# Patient Record
Sex: Male | Born: 1935 | Race: White | Hispanic: No | Smoking: Former smoker
Health system: Southern US, Community
[De-identification: ages and names within clinical notes are randomized; demographics above are authoritative.]

## PROBLEM LIST (undated history)

## (undated) DIAGNOSIS — I4819 Other persistent atrial fibrillation: Secondary | ICD-10-CM

## (undated) DIAGNOSIS — D649 Anemia, unspecified: Secondary | ICD-10-CM

## (undated) DIAGNOSIS — I472 Ventricular tachycardia, unspecified: Secondary | ICD-10-CM

## (undated) DIAGNOSIS — K219 Gastro-esophageal reflux disease without esophagitis: Secondary | ICD-10-CM

## (undated) DIAGNOSIS — N183 Chronic kidney disease, stage 3 unspecified: Secondary | ICD-10-CM

## (undated) DIAGNOSIS — I2510 Atherosclerotic heart disease of native coronary artery without angina pectoris: Secondary | ICD-10-CM

## (undated) DIAGNOSIS — I6529 Occlusion and stenosis of unspecified carotid artery: Secondary | ICD-10-CM

## (undated) DIAGNOSIS — I7300 Raynaud's syndrome without gangrene: Secondary | ICD-10-CM

## (undated) DIAGNOSIS — I4729 Other ventricular tachycardia: Secondary | ICD-10-CM

## (undated) DIAGNOSIS — E119 Type 2 diabetes mellitus without complications: Secondary | ICD-10-CM

## (undated) DIAGNOSIS — Z9981 Dependence on supplemental oxygen: Secondary | ICD-10-CM

## (undated) DIAGNOSIS — I739 Peripheral vascular disease, unspecified: Secondary | ICD-10-CM

## (undated) DIAGNOSIS — I639 Cerebral infarction, unspecified: Secondary | ICD-10-CM

## (undated) DIAGNOSIS — J449 Chronic obstructive pulmonary disease, unspecified: Secondary | ICD-10-CM

## (undated) DIAGNOSIS — M1990 Unspecified osteoarthritis, unspecified site: Secondary | ICD-10-CM

## (undated) DIAGNOSIS — G459 Transient cerebral ischemic attack, unspecified: Secondary | ICD-10-CM

## (undated) DIAGNOSIS — I5042 Chronic combined systolic (congestive) and diastolic (congestive) heart failure: Secondary | ICD-10-CM

## (undated) DIAGNOSIS — I10 Essential (primary) hypertension: Secondary | ICD-10-CM

## (undated) DIAGNOSIS — I951 Orthostatic hypotension: Secondary | ICD-10-CM

## (undated) DIAGNOSIS — R5381 Other malaise: Secondary | ICD-10-CM

## (undated) DIAGNOSIS — E785 Hyperlipidemia, unspecified: Secondary | ICD-10-CM

## (undated) DIAGNOSIS — D696 Thrombocytopenia, unspecified: Secondary | ICD-10-CM

## (undated) DIAGNOSIS — I255 Ischemic cardiomyopathy: Secondary | ICD-10-CM

## (undated) HISTORY — DX: Other ventricular tachycardia: I47.29

## (undated) HISTORY — DX: Occlusion and stenosis of unspecified carotid artery: I65.29

## (undated) HISTORY — DX: Ischemic cardiomyopathy: I25.5

## (undated) HISTORY — DX: Ventricular tachycardia: I47.2

## (undated) HISTORY — DX: Peripheral vascular disease, unspecified: I73.9

## (undated) HISTORY — PX: FINGER FRACTURE SURGERY: SHX638

## (undated) HISTORY — PX: ANAL FISTULECTOMY: SHX1139

## (undated) HISTORY — DX: Raynaud's syndrome without gangrene: I73.00

## (undated) HISTORY — PX: CATARACT EXTRACTION, BILATERAL: SHX1313

## (undated) HISTORY — DX: Ventricular tachycardia, unspecified: I47.20

---

## 1989-01-19 HISTORY — PX: CORONARY ARTERY BYPASS GRAFT: SHX141

## 2001-04-10 ENCOUNTER — Ambulatory Visit (HOSPITAL_COMMUNITY): Admission: RE | Admit: 2001-04-10 | Discharge: 2001-04-10 | Payer: Self-pay | Admitting: General Surgery

## 2001-04-10 ENCOUNTER — Encounter: Payer: Self-pay | Admitting: General Surgery

## 2001-04-30 ENCOUNTER — Encounter: Admission: RE | Admit: 2001-04-30 | Discharge: 2001-04-30 | Payer: Self-pay | Admitting: General Surgery

## 2001-04-30 ENCOUNTER — Encounter: Payer: Self-pay | Admitting: General Surgery

## 2003-01-29 ENCOUNTER — Encounter: Payer: Self-pay | Admitting: Internal Medicine

## 2003-01-29 ENCOUNTER — Ambulatory Visit (HOSPITAL_COMMUNITY): Admission: RE | Admit: 2003-01-29 | Discharge: 2003-01-29 | Payer: Self-pay | Admitting: Internal Medicine

## 2004-08-13 ENCOUNTER — Inpatient Hospital Stay (HOSPITAL_COMMUNITY): Admission: RE | Admit: 2004-08-13 | Discharge: 2004-08-14 | Payer: Self-pay | Admitting: Internal Medicine

## 2005-01-09 ENCOUNTER — Emergency Department: Payer: Self-pay | Admitting: Emergency Medicine

## 2005-02-07 ENCOUNTER — Ambulatory Visit: Payer: Self-pay | Admitting: Internal Medicine

## 2005-08-02 ENCOUNTER — Ambulatory Visit: Payer: Self-pay | Admitting: Internal Medicine

## 2005-10-11 ENCOUNTER — Ambulatory Visit (HOSPITAL_COMMUNITY): Admission: RE | Admit: 2005-10-11 | Discharge: 2005-10-11 | Payer: Self-pay | Admitting: Orthopedic Surgery

## 2006-01-23 ENCOUNTER — Encounter: Admission: RE | Admit: 2006-01-23 | Discharge: 2006-04-23 | Payer: Self-pay | Admitting: Orthopedic Surgery

## 2006-02-17 ENCOUNTER — Encounter: Admission: RE | Admit: 2006-02-17 | Discharge: 2006-02-17 | Payer: Self-pay | Admitting: Orthopedic Surgery

## 2006-03-06 ENCOUNTER — Ambulatory Visit: Payer: Self-pay

## 2006-12-01 ENCOUNTER — Ambulatory Visit: Payer: Self-pay

## 2006-12-01 ENCOUNTER — Ambulatory Visit: Payer: Self-pay | Admitting: Internal Medicine

## 2007-02-26 ENCOUNTER — Ambulatory Visit: Payer: Self-pay | Admitting: Internal Medicine

## 2007-06-25 ENCOUNTER — Encounter: Admission: RE | Admit: 2007-06-25 | Discharge: 2007-06-25 | Payer: Self-pay | Admitting: Orthopedic Surgery

## 2007-08-13 ENCOUNTER — Encounter: Admission: RE | Admit: 2007-08-13 | Discharge: 2007-08-13 | Payer: Self-pay | Admitting: Orthopedic Surgery

## 2008-05-14 ENCOUNTER — Encounter: Admission: RE | Admit: 2008-05-14 | Discharge: 2008-05-14 | Payer: Self-pay | Admitting: Orthopedic Surgery

## 2008-05-15 ENCOUNTER — Encounter: Admission: RE | Admit: 2008-05-15 | Discharge: 2008-05-15 | Payer: Self-pay | Admitting: Orthopedic Surgery

## 2008-05-21 ENCOUNTER — Ambulatory Visit: Payer: Self-pay | Admitting: Internal Medicine

## 2008-06-02 ENCOUNTER — Other Ambulatory Visit: Payer: Self-pay

## 2008-06-02 ENCOUNTER — Ambulatory Visit: Payer: Self-pay | Admitting: Ophthalmology

## 2008-06-18 ENCOUNTER — Ambulatory Visit: Payer: Self-pay | Admitting: Ophthalmology

## 2008-11-17 ENCOUNTER — Emergency Department: Payer: Self-pay | Admitting: Emergency Medicine

## 2008-12-09 ENCOUNTER — Encounter: Admission: RE | Admit: 2008-12-09 | Discharge: 2008-12-09 | Payer: Self-pay | Admitting: Orthopedic Surgery

## 2008-12-22 ENCOUNTER — Encounter: Payer: Self-pay | Admitting: Internal Medicine

## 2008-12-30 ENCOUNTER — Encounter: Admission: RE | Admit: 2008-12-30 | Discharge: 2008-12-30 | Payer: Self-pay | Admitting: Anesthesiology

## 2009-01-20 ENCOUNTER — Encounter: Admission: RE | Admit: 2009-01-20 | Discharge: 2009-01-20 | Payer: Self-pay | Admitting: Orthopedic Surgery

## 2009-06-26 ENCOUNTER — Encounter (INDEPENDENT_AMBULATORY_CARE_PROVIDER_SITE_OTHER): Payer: Self-pay | Admitting: *Deleted

## 2009-07-25 DIAGNOSIS — M549 Dorsalgia, unspecified: Secondary | ICD-10-CM | POA: Insufficient documentation

## 2009-07-25 DIAGNOSIS — I472 Ventricular tachycardia, unspecified: Secondary | ICD-10-CM | POA: Insufficient documentation

## 2009-07-25 DIAGNOSIS — E1129 Type 2 diabetes mellitus with other diabetic kidney complication: Secondary | ICD-10-CM | POA: Insufficient documentation

## 2009-07-25 DIAGNOSIS — I509 Heart failure, unspecified: Secondary | ICD-10-CM | POA: Insufficient documentation

## 2009-07-25 DIAGNOSIS — Z95810 Presence of automatic (implantable) cardiac defibrillator: Secondary | ICD-10-CM

## 2009-07-25 DIAGNOSIS — M129 Arthropathy, unspecified: Secondary | ICD-10-CM

## 2009-07-25 DIAGNOSIS — I739 Peripheral vascular disease, unspecified: Secondary | ICD-10-CM

## 2009-07-25 DIAGNOSIS — I255 Ischemic cardiomyopathy: Secondary | ICD-10-CM | POA: Insufficient documentation

## 2009-07-25 DIAGNOSIS — M1990 Unspecified osteoarthritis, unspecified site: Secondary | ICD-10-CM

## 2009-08-04 ENCOUNTER — Ambulatory Visit: Payer: Self-pay | Admitting: Internal Medicine

## 2009-08-05 ENCOUNTER — Encounter: Payer: Self-pay | Admitting: Internal Medicine

## 2010-01-14 ENCOUNTER — Encounter (INDEPENDENT_AMBULATORY_CARE_PROVIDER_SITE_OTHER): Payer: Self-pay | Admitting: *Deleted

## 2010-06-29 ENCOUNTER — Encounter: Payer: Self-pay | Admitting: Internal Medicine

## 2010-08-20 ENCOUNTER — Encounter (INDEPENDENT_AMBULATORY_CARE_PROVIDER_SITE_OTHER): Payer: Self-pay | Admitting: *Deleted

## 2010-10-22 ENCOUNTER — Encounter: Admission: RE | Admit: 2010-10-22 | Discharge: 2010-10-22 | Payer: Self-pay | Admitting: Orthopedic Surgery

## 2010-10-28 ENCOUNTER — Ambulatory Visit: Payer: Self-pay | Admitting: Internal Medicine

## 2010-12-23 NOTE — Letter (Signed)
Summary: Generic Letter  [facility], Main Office  [address]   [place], [place] [address]   Phone: [phone]  Fax: [phone]        June 29, 2010 MRN: [id number]    Victor [name] [address] [place], [place]  [address]    Dear Victor [name],   Our records show that you missed an appointment for a device check in January 2011.  It is past due for a device check and it is also time for an office visit with Dr Ladona Ridgel.  Please call our office to schedule an office visit with Dr Ladona Ridgel.    Sincerely,  Vella Kohler  This letter has been electronically signed by your physician.

## 2010-12-23 NOTE — Letter (Signed)
Summary: Device-Delinquent Check  [facility], Main Office  [address]   [place], [place] [address]   Phone: [phone]  Fax: [phone]     August 20, 2010 MRN: [id number]   Victor [name] [address] [place], [place]  [address]   Dear [name],  According to our records, you have not had your implanted device checked in the recommended period of time.  We are unable to determine appropriate device function without checking your device on a regular basis.  Please call our office to schedule an appointment with Dr Ladona Ridgel,  as soon as possible.  If you are having your device checked by another physician, please call us so that we may update our records.  Thank you,   [facility]

## 2010-12-23 NOTE — Letter (Signed)
Summary: Device-Delinquent Check  [facility], Main Office  [address]   [place], [place] [address]   Phone: [phone]  Fax: [phone]     August 20, 2010 MRN: [id number]   Victor [name] [address] [place], [place]  [address]   Dear Mr. Heinle,  According to our records, you have not had your implanted device checked in the recommended period of time.  We are unable to determine appropriate device function without checking your device on a regular basis.  Please call our office to schedule an appointment with Dr Taylor,  as soon as possible.  If you are having your device checked by another physician, please call us so that we may update our records.  Thank you,   [facility]  

## 2010-12-23 NOTE — Assessment & Plan Note (Signed)
Summary: pacer ck/mt   Visit Type:  Follow-up Primary Provider:  [facility]    History of Present Illness: Victor [name] returns today for ICD followup.  He is a 75 yo man with a h/o ICM and VT s/p ICD implant.  He denies c/p or sob.   He denies any intercurrent ICD therapies.  Mild peripheral edema.  He notes that in the interim he was kicked by his horse resulting in an ER visit.  No other complaints.  Current Medications (verified): 1)  Lyrica 50 Mg Caps (Pregabalin) .Marland Kitchen.. 1 By Mouth Once Daily 2)  Glucophage 850 Mg Tabs (Metformin Hcl) .Marland Kitchen.. 1 By Mouth Two Times A Day 3)  Aspirin Ec 325 Mg Tbec (Aspirin) .... Take One Tablet By Mouth Daily 4)  Humulin 70/30 70-30 % Susp (Insulin Isophane & Regular) .... As Directed 5)  Furosemide 40 Mg Tabs (Furosemide) .... Take One Tablet By Mouth Daily As Needed 6)  Lovastatin 10 Mg Tabs (Lovastatin) .... Take One Tablet By Mouth Daily At Bedtime  Allergies (verified): No Known Drug Allergies  Past History:  Past Medical History: Last updated: 07/25/2009 DEGENERATIVE JOINT DISEASE (ICD-715.90) DM (ICD-250.00) ARTHRITIS (ICD-716.90) CAROTID ARTERY DISEASE (ICD-433.10) PERIPHERAL VASCULAR DISEASE (ICD-443.9) CHF (ICD-428.0) BACK PAIN (ICD-724.5) AUTOMATIC IMPLANTABLE CARDIAC DEFIBRILLATOR SITU (ICD-V45.02) VENTRICULAR TACHYCARDIA (ICD-427.1) CARDIOMYOPATHY, ISCHEMIC (ICD-414.8)  Past Surgical History: Last updated: 07/25/2009   Anal fistulotomy.  Explantation of a previously implanted Guidant ICD and insertion of a new Guidant ICD  08/13/2004 Doylene Canning. Ladona Ridgel, M.D.   Review of Systems  The patient denies chest pain, syncope, dyspnea on exertion, and peripheral edema.    Vital Signs:  Patient profile:   75 year old male Height:      68 inches Weight:      195 pounds BMI:     29.76 Pulse rate:   76 / minute BP sitting:   122 / 70  (left arm)  Vitals Entered By: Laurance Flatten CMA (October 28, 2010 11:42 AM)  Physical  Exam  General:  Well developed, well nourished, in no acute distress. Head:  normocephalic and atraumatic Eyes:  PERRLA/EOM intact; conjunctiva and lids normal. Mouth:  Teeth, gums and palate normal. Oral mucosa normal. Neck:  Neck supple, no JVD. No masses, thyromegaly or abnormal cervical nodes. Chest Wall:  Well healed ICD incision. Lungs:  Clear bilaterally to auscultation with no wheezes, rales, or rhonchi. Heart:  RRR with normal S1 and S2.  PMI is enlarged and laterally displaced. Abdomen:  Bowel sounds positive; abdomen soft and non-tender without masses, organomegaly, or hernias noted. No hepatosplenomegaly. Msk:  Back normal, normal gait. Muscle strength and tone normal. Pulses:  pulses normal in all 4 extremities Extremities:  No clubbing or cyanosis. Neurologic:  Alert and oriented x 3.    ICD Specifications Following MD:  Lewayne Bunting, MD     ICD Vendor:  [facility] [employer]     ICD Model Number:  T135     ICD Serial Number:  [id number] ICD DOI:  08/13/2004     ICD Implanting MD:  Lewayne Bunting, MD  Lead 1:    Location: RV     DOI: 08/13/2004     Model #: [id number]     Serial #: [id number]     Status: active  Indications::  VT,ICM   ICD Follow Up Remote Check?  No Battery Voltage:  2.58 V     Charge Time:  10.8 seconds     Battery Est. Longevity:  MOL2 Underlying rhythm:  SR ICD Dependent:  No       ICD Device Measurements Right Ventricle:  Amplitude: 14.5 mV, Impedance: 530 ohms, Threshold: 0.6 V at 0.4 msec Shock Impedance: 46 ohms   Episodes Shock:  0     ATP:  1     Nonsustained:  4     ICD Appropriate Therapy?  Yes Ventricular Pacing:  0%  Brady Parameters Mode VVI     Lower Rate Limit:  40      Tachy Zones VF:  220     VT:  195     VT1:  165     Next Cardiology Appt Due:  01/20/2011 Tech Comments:  No parameter changes.   Device function normal.  1 VT episodes treated successfully with ATP.  No Latitude 2 this time.  ROV 3 months clinic. Altha Harm, LPN  October 28, 2010 12:04 PM  MD Comments:  Agree with above.  Impression & Recommendations:  Problem # 1:  AUTOMATIC IMPLANTABLE CARDIAC DEFIBRILLATOR SITU (ICD-V45.02) His device is working normally, Psychologist, prison and probation services.    Problem # 2:  VENTRICULAR TACHYCARDIA (ICD-427.1) His VT has been well controlled.  Continue conservative therapy.  His updated medication list for this problem includes:    Aspirin Ec 325 Mg Tbec (Aspirin) .Marland Kitchen... Take one tablet by mouth daily  Problem # 3:  CHF (ICD-428.0) His symptoms are well controlled. He is not on a beta blocker secondar to fatigue. His updated medication list for this problem includes:    Aspirin Ec 325 Mg Tbec (Aspirin) .Marland Kitchen... Take one tablet by mouth daily    Furosemide 40 Mg Tabs (Furosemide) .Marland Kitchen... Take one tablet by mouth daily as needed  Patient Instructions: 1)  Your physician recommends that you schedule a follow-up appointment in: 3 months in the device clinic 2)  Your physician recommends that you continue on your current medications as directed. Please refer to the Current Medication list given to you today.

## 2010-12-23 NOTE — Cardiovascular Report (Signed)
Summary: Office Visit   Office Visit   Imported By: Roderic Ovens 11/04/2010 12:03:21  _____________________________________________________________________  External Attachment:    Type:   Image     Comment:   External Document

## 2010-12-23 NOTE — Letter (Signed)
Summary: Appointment - Missed  [facility], Main Office  [address]   [place], [place] [address]   Phone: [phone]  Fax: [phone]     January 14, 2010 MRN: [id number]   RAMEL TOBON [address] [place], [place]  [address]   Dear Mr. Cullars,  Our records indicate you missed your appointment on 12/09/09 with [facility] for wound check. It is very important that we reach you to reschedule this appointment. We look forward to participating in your health care needs. Please contact us at the number listed above at your earliest convenience to reschedule this appointment.     Sincerely,   Ruel Favors Scheduling Team

## 2011-01-18 ENCOUNTER — Encounter (INDEPENDENT_AMBULATORY_CARE_PROVIDER_SITE_OTHER): Payer: Self-pay | Admitting: *Deleted

## 2011-01-27 NOTE — Letter (Signed)
Summary: Appointment - Reschedule  [facility], Main Office  [address]   [place], [place] [address]   Phone: [phone]  Fax: [phone]     January 18, 2011 MRN: [id number]   AJDIN MACKE [address] [place], [place]  [address]   Dear Mr. Maines,   Due to a change in our office schedule, your appointment on  01-27-11  at  12:00p               must be changed.  It is very important that we reach you to reschedule this appointment. We look forward to participating in your health care needs. Please contact us at the number listed above at your earliest convenience to reschedule this appointment.     Sincerely,  Glass blower/designer

## 2011-02-07 ENCOUNTER — Encounter: Payer: Self-pay | Admitting: Internal Medicine

## 2011-02-07 ENCOUNTER — Encounter (INDEPENDENT_AMBULATORY_CARE_PROVIDER_SITE_OTHER): Payer: Medicare Other

## 2011-02-07 DIAGNOSIS — I428 Other cardiomyopathies: Secondary | ICD-10-CM

## 2011-02-11 ENCOUNTER — Ambulatory Visit
Admission: RE | Admit: 2011-02-11 | Discharge: 2011-02-11 | Disposition: A | Discharge status: Home / Self Care | Payer: Managed Care, Other (non HMO) | Source: Ambulatory Visit | Attending: Orthopedic Surgery | Admitting: Orthopedic Surgery

## 2011-02-11 ENCOUNTER — Other Ambulatory Visit: Payer: Self-pay | Admitting: Orthopedic Surgery

## 2011-02-11 DIAGNOSIS — M545 Low back pain: Secondary | ICD-10-CM

## 2011-02-17 NOTE — Procedures (Signed)
Summary: 3 [email or web address]  [facility]   Current Medications (verified): 1)  Humulin 70/30 70-30 % Susp (Insulin Isophane & Regular) .... As Directed 2)  Lyrica 75 Mg Caps (Pregabalin) .... 2 By Mouth Two Times A Day 3)  Metformin Hcl 850 Mg Tabs (Metformin Hcl) .... One By Mouth Two Times A Day 4)  Aspirin 81 Mg Tbec (Aspirin) .... One By Mouth Daily 5)  Furosemide 20 Mg Tabs (Furosemide) .... One By Mouth Daily 6)  Pravastatin Sodium 40 Mg Tabs (Pravastatin Sodium) .... One By Mouth Daily 7)  Pantoprazole Sodium 40 Mg Tbec (Pantoprazole Sodium) .... One By Mouth Daily 8)  Oxycodone-Acetaminophen 5-325 Mg Tabs (Oxycodone-Acetaminophen) .... As Needed 9)  Hydrocodone-Acetaminophen 10-650 Mg Tabs (Hydrocodone-Acetaminophen) .... As Needed 10)  Prednisone 10 Mg Tabs (Prednisone) .... One By Mouth Daily 11)  Cilostazol 100 Mg Tabs (Cilostazol) .... One By Mouth Two Times A Day 12)  Skelaxin 800 Mg Tabs (Metaxalone) .... One By Mouth Two Times A Day 13)  Fish Oil 1000 Mg Caps (Omega-3 Fatty Acids) .... One By Mouth Daily  Allergies (verified): No Known Drug Allergies   ICD Specifications Following MD:  Lewayne Bunting, MD     ICD Vendor:  [facility] [employer]     ICD Model Number:  T135     ICD Serial Number:  [id number] ICD DOI:  08/13/2004     ICD Implanting MD:  Lewayne Bunting, MD  Lead 1:    Location: RV     DOI: 08/13/2004     Model #: [id number]     Serial #: [id number]     Status: active  Indications::  VT,ICM   ICD Follow Up ICD Dependent:  No      Brady Parameters Mode VVI     Lower Rate Limit:  40      Tachy Zones VF:  220     VT:  195     VT1:  165     Tech Comments:  See Pace Art

## 2011-04-04 ENCOUNTER — Ambulatory Visit
Admission: RE | Admit: 2011-04-04 | Discharge: 2011-04-04 | Disposition: A | Discharge status: Home / Self Care | Payer: Medicare Other | Source: Ambulatory Visit | Attending: Orthopedic Surgery | Admitting: Orthopedic Surgery

## 2011-04-04 ENCOUNTER — Other Ambulatory Visit: Payer: Self-pay | Admitting: Orthopedic Surgery

## 2011-04-04 DIAGNOSIS — S0083XA Contusion of other part of head, initial encounter: Secondary | ICD-10-CM

## 2011-04-05 NOTE — Assessment & Plan Note (Signed)
[facility]                         ELECTROPHYSIOLOGY OFFICE NOTE   FUTURE, YELDELL                       MRN:          [id number]  DATE:02/26/2007                            DOB:          [birth year]    Victor [name] returns today for followup.  He is a pleasant 75 year old  male with an ischemic cardiomyopathy and ventricular tachycardia,  diabetes, arthritis, and other medical problems.  The patient returns  today followup.  When I saw him last back in January he was not on beta  blocker and was not completely clear that he had ever been tried on one,  although I suspect in retrospect that he had, so we started him on three  twice a day of Coreg and unfortunately he developed severe weakness and  shortness of breath which resolved immediately after d/cranial nerves of  his beta blocker.  At this point I would say he is intolerant to beta  blockers based on the above findings.  The patient's arthritis has  improved some and he continues to work and be quite vigorous, riding his  horses on a regular basis.   EXAMINATION TODAY:  GENERAL:  He is a pleasant 75 year old man in no  acute distress.  VITAL SIGNS:  The blood pressure was 120/68, the pulse 81 and regular,  respirations were 18.  NECK:  Revealed no jugular venous distention.  Today there was a very  soft carotid bruit on the right.  This was present from before.  LUNGS:  Reveal no rales, no wheezes, no rhonchi.  There is no increased  work of breathing.  CARDIOVASCULAR:  Revealed a regular rate and rhythm with normal S1 and  S2.  EXTREMITIES:  Demonstrated no cyanosis, clubbing or edema.   His EKG demonstrated sinus rhythm with prior inferior MI.   Interrogation of his defibrillator demonstrates a Guidant Vitality with  R waves of 10, a pacing impedance of 700 ohms, and a threshold of 0.6  volts at 0.5 milliseconds.  Battery voltage is 3.10 volts.  There are no  intercurrent ICD  therapies.   IMPRESSION:  1. Ischemic cardiomyopathy.  2. Ventricular tachycardia.  3. Congestive heart failure, presently class II.  4. Peripheral/carotid vascular disease.  5. Arthritis.  6. Diabetes.   DISCUSSION:  Overall, Mr. Higginbotham is stable despite his multiple medical  problems.  He will continue on his present medical therapy which  includes aspirin, Glucophage, furosemide, potassium, and lovastatin.  I  will plan to see him back in the office in 1 year, sooner if he should  have any additional problems.    Doylene Canning. Ladona Ridgel, MD  Electronically Signed   GWT/MedQ  DD: 02/26/2007  DT: 02/26/2007  Job #: [id number]

## 2011-04-05 NOTE — Assessment & Plan Note (Signed)
[facility]                         ELECTROPHYSIOLOGY OFFICE NOTE   DERRIC, DEALMEIDA                       MRN:          [id number]  DATE:05/21/2008                            DOB:          [birth year]    [name] returns today for followup.  He is a very pleasant man with a  longstanding history of coronary artery disease and prior myocardial  infarction, bypass grafting, and insertion of a defibrillator initially  back in 1990.  He has had multiple implants in the past and returns  today for followup.  His most current problem has been that of severe  arthritis both in his neck and his lower back.  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  He denies chest pain.  He denies shortness of breath.   CURRENT MEDICATIONS:  Include Lyrica 2 tablets a day, Darvocet 1 tablet  3 times a day, Glucophage 850 twice a day, aspirin 325 a day, Humulin  insulin 70/30, furosemide 40 p.r.n., lovastatin daily, and multiple  muscle relaxers and pain medications.   He had been intolerant in the past to BETA-BLOCKERs.   PHYSICAL EXAMINATION:  GENERAL:  He is a pleasant, middle-aged man in no  acute distress.  VITAL SIGNS:  The blood pressure today was 120/78, the pulse was 73 and  regular, respirations were 18, and the weight was 209 pounds.  NECK:  Revealed no jugular venous distention.  LUNGS:  Clear bilaterally to auscultation.  No wheezes, rales, or  rhonchi are present.  CARDIOVASCULAR:  Regular rate and rhythm with normal S1, S2, with no  murmurs, rubs, or gallops.  ABDOMEN:  Soft, nontender.  EXTREMITIES:  Demonstrated no cyanosis, clubbing, or edema.  The pulses  are 2+ and symmetric.   Interrogation of his defibrillator demonstrates a Guidant Vitality T-135  implanted in September 2005.  Battery voltage was 2.8 volts, the R-waves  were 11, the impedance 666, and threshold 0.6 at 0.5.  The  patient had  one episode of VT, which was terminated with  antitachycardia pacing  therapy and one nonsustained episode terminated spontaneously.   IMPRESSION:  1. Ischemic cardiomyopathy.  2. Ventricular tachycardia.  3. Status post implantable cardioverter-defibrillator insertion.  4. Very severe back pain.   DISCUSSION:  Overall, [name] is stable.  His defibrillator is  working normally.  Hopefully, his back problems will improve.  I will  see him back in the ICD clinic in a year.     Doylene Canning. Ladona Ridgel, MD  Electronically Signed    GWT/MedQ  DD: 05/21/2008  DT: 05/22/2008  Job #: [id number]

## 2011-04-08 NOTE — Discharge Summary (Signed)
Victor [name], Victor [name] NO.:  [id number]   MEDICAL RECORD NO.:  [id number]          PATIENT TYPE:  INP   LOCATION:  3711                         FACILITY:  [facility]   PHYSICIAN:  Doylene Canning. Ladona Ridgel, M.D.  DATE OF BIRTH:  Nov 04, 1936   DATE OF ADMISSION:  08/13/2004  DATE OF DISCHARGE:  08/14/2004                                 DISCHARGE SUMMARY   ADDENDUM:  Please see the original discharge summary for complete details.   On the morning of August 14, 2004, the patient was seen by Dr. Graciela Husbands.  He  was doing well status post ICD implantation.  His chest x-ray was without  pneumothorax.   DISCHARGE MEDICATIONS:  As previously dictated.   FOLLOW UP:  As previously dictated on September 09, 2004,  at 9:30 with the  [facility] and Dr. Ladona Ridgel in three months.  The office will contact him  with an appointment.       SW/MEDQ  D:  08/14/2004  T:  08/14/2004  Job:  [id number]   cc:   ________ Malissa Hippo, M.D.

## 2011-04-08 NOTE — Op Note (Signed)
Victor [name], Victor [name] NO.:  [id number]   MEDICAL RECORD NO.:  [id number]          PATIENT TYPE:  INP   LOCATION:  2889                         FACILITY:  [facility]   PHYSICIAN:  Doylene Canning. Ladona Ridgel, M.D.  DATE OF BIRTH:  11/09/1936   DATE OF PROCEDURE:  08/13/2004  DATE OF DISCHARGE:                                 OPERATIVE REPORT   PROCEDURE PERFORMED:  Removal of a previously implanted abdominal epicardial  implantable cardioverter-defibrillator generator with capping of the old  epicardial leads and patches, followed by insertion of a new transvenous  single-chamber defibrillator by way of the left subclavian vein utilizing  defibrillation threshold testing.   SURGEON:  Doylene Canning. Ladona Ridgel, M.D.   I. INTRODUCTION:  The patient is a 75 year old male with a history of VT and  ischemic cardiomyopathy, who is status post initial ICD 15 years ago.  At  that time, he had an epicardial system placed transthoracically.  He has had  3 ICD generator changes, the last a year and a half ago.  Over the last 6  months, he was noted to have beeping which he thought was coming from his  defibrillator.  Despite this, he was checked several times in our office and  found to have the defibrillator working normally with normal battery life.  The patient, on re-interrogation of the device, was found to have the device  at end of life and was unable to be communicated with.  He is now referred  for ICD generator removal from his previously implanted epicardial system,  abandonment of the epicardial leads and insertion of a new transvenous  system by way of the left subclavian vein.   II. PROCEDURE:  After informed consent was obtained, the patient was taken  to the diagnostic EP lab in a fasting state.  After the usual preparation  and draping, intravenous fentanyl and midazolam were given for sedation.  A  total of 30 mL of lidocaine were infiltrated into the left upper quadrant  region  in the abdomen over the old ICD site.  A 10-cm incision was carried  out over this region and electrocautery utilized to dissect down to the  previously implanted abdominal generator.  It was removed with gentle  traction.  The epicardial leads and patches were subsequently capped and  secured in place back in the pocket.  Kanamycin irrigation was utilized to  irrigate the pocket and the incision was closed with a layer of 2-0 Vicryl,  followed by a layer of 3-0 Vicryl, followed by a layer of 4-0 Vicryl.  Benzoin was painted on the skin, Steri-Strips were applied and the patient  reprepped and draped for a new transvenous ICD insertion by way of the left  subclavian vein.   After the usual preparation and draping, additional fentanyl and midazolam  were given in the left infraclavicular region.  Thirty milliliters of  lidocaine were infiltrated over this region and a 9-cm incision was carried  out in the left infraclavicular region.  Electrocautery was again utilized  to dissect down to the fascial plane.  The  left subclavian vein was  punctured and the Guidant model [id number] defibrillation lead was advanced  by way of the left subclavian vein into the right ventricle.  Mapping was  carried out along the RV outflow tract as well as the RV septum and the RV  apex.  Overall, the R waves were markedly reduced throughout the area.  At  the final site which had the highest R wave initially, the R waves measured  8-9 mV, but once the lead was actively affixed, the R waves measured 6.0 to  6.5 mV.  The pacing impedance with the lead actively affixed was 843 ohms  and stable, and a ventricular threshold of 0.8 volts at 0.5 msec.  Ten-volt  pacing did not stimulate the diaphragm.  With the defibrillation lead in  satisfactory position, it was secured to the sub-pectoralis fascia with a  figure-of-eight silk suture.  In addition, the sew-in sleeve was secured  with silk suture.  Electrocautery  was utilized to make a subcutaneous  pocket.  Kanamycin irrigation was utilized to irrigate the pocket and  electrocautery utilized to assure hemostasis.  The Guidant Vitality DSVR  model T135 single-chamber defibrillator, serial [id number], was connected to  the defibrillation lead and placed in the subcutaneous pocket.  The  generator was secured with silk suture.  Additional Kanamycin was utilized  to irrigate the pocket and defibrillation threshold testing carried out.   After the patient was more deeply sedated with fentanyl and Versed, VF was  induced with a T wave shock.  A 14-joule shock was delivered which  terminated ventricular fibrillation and restored sinus rhythm.  Five minutes  were allowed to lapse and the second VF test carried out.  Again, VF was  induced with a T wave shock and a second 14-joule shock was delivered which  terminated ventricular fibrillation, restoring sinus rhythm.  At this point,  no additional defibrillation threshold testing was carried out and the  incision was closed with a layer of 2-0 Vicryl, followed by a layer of 3-0  Vicryl, followed by a layer of 4-0 Vicryl.  Benzoin was painted on the skin,  Steri-Strips were applied and a pressure dressing, and the patient returned  to his room in satisfactory condition.   III. COMPLICATIONS:  There were no immediate procedure complications.   IV. RESULTS:  This demonstrates successful implantation of a Guidant single-  chamber defibrillator following the removal of a previously implanted  Guidant epicardial system with capping of the epicardial leads and  epicardial patches, and abandonment of this system, followed by the  insertion of a new transvenous system.  Please note that the 75 year old  epicardial system was abandoned secondary to the unreliability of the  patient's patches and leads.       GWT/MEDQ  D:  08/13/2004  T:  08/15/2004  Job:  [id number]   cc:   Marcie Bal.D.

## 2011-04-08 NOTE — Op Note (Signed)
[place]. [facility]  Patient:    Victor [name], Victor [name]                         MRN: [id number] Proc. Date: 04/10/01 Attending:  Sharlet Salina T. Hoxworth, M.D.                           Operative Report  PREOPERATIVE DIAGNOSIS:  Fistula in ano.  POSTOPERATIVE DIAGNOSIS:  Fistula in ano.  PROCEDURE:  Anal fistulotomy.  SURGEON:  Lorne Skeens. Hoxworth, M.D.  ANESTHESIA:  General.  BRIEF HISTORY:  Mr. 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  Anal fistulotomy has been recommended and accepted.  The nature of the procedure, its indications, and risks were discussed in the office, and he now presents for this procedure.  DESCRIPTION OF PROCEDURE:  The patient was brought to the operating room, placed in the supine position on the operating table, and general endotracheal anesthesia was induced.  He was carefully positioned and padded in the lithotomy position, and the perineum was prepped and draped.  He received antibiotics preoperatively.  The external opening was about 3 cm from the anal verge in the anterior position, with an easily palpable internal opening radially oriented.  A probe was able to be passed very easily through the external opening, through the internal opening, into the anus.  The skin and anoderm overlying the probe was then incised and dissection deepened down toward the probe.  The probe was found to have traversed a significant portion of the external anal sphincter, and this was partially divided and then a rubber band seton was placed through the remainder of the fistula tract rather than dividing it all at this setting due to concern of incontinence. Hemostasis was obtained with cautery.  Soft tissue was infiltrated with Marcaine.  Sponge, needle, and instrument counts were correct.  The patient was taken to recovery in  good condition. DD:  04/10/01 TD:  04/10/01 Job: [id number]

## 2011-04-08 NOTE — Op Note (Signed)
Victor [name], CRAGO NO.:  [id number]   MEDICAL RECORD NO.:  [id number]                   PATIENT TYPE:  OIB   LOCATION:  2864                                 FACILITY:  [facility]   PHYSICIAN:  Doylene Canning. Ladona Ridgel, M.D. [facility]           DATE OF BIRTH:  [birth year]   DATE OF PROCEDURE:  01/29/2003  DATE OF DISCHARGE:                                 OPERATIVE REPORT   PROCEDURE:  Explantation of a previously implanted Guidant ICD and insertion  of a new Guidant ICD with defibrillation threshold testing and  defibrillation lead testing.   INTRODUCTION:  The patient is a very pleasant, middle-aged man with a  history of an ischemic cardiomyopathy and sustained VT with syncope, with  easily inducible VT at EP testing.  The patient underwent initial ICD  insertion in 1990 with epicardial patches placed at that time.  His initial  system utilized two epicardial-0040 patches, serial numbers [id number] and  serial numbers [id number].  His epicardial pacing leads were epicardial screws  model 4312, serial number [id number] and [id number].  The patient was recently found  to be at device ERI and is now referred for ICD generator change.   DESCRIPTION OF PROCEDURE:  After informed consent was obtained, the patient  was taken to the diagnostic EP lab in the fasting state. After usual  preparation and draping, a total of 30 mL of lidocaine was infiltrated into  the left abdominal region over the previous ICD insertion scar.  A 10 cm  incision was carried out over the old ICD scar and electrocautery utilized  to dissect down to the old device. It was removed with gentle traction using  Kelly forceps.  The defibrillation and pacing leads were subsequently  removed and tested. R waves were found to be 19 millivolts and the pacing  threshold was 1.4 volts at 0.5 milliseconds with a pacing impedence of 587  ohms.  The patient was deeply sedated. A 1 joule shock delivered through the  device  demonstrated a cycle impedence of 38 ohms.  At this point,  defibrillation threshold testing was carried out.   The patient had VF induced with a T wave shock. A 14 joule shock terminated  ventricular fibrillation restoring sinus rhythm. The shock impedence was 37  ohms. The duration of VF was seven seconds.  With satisfactory  defibrillation threshold testing, the incision was irrigated with Kanamycin  irrigation.  Electrocautery was utilized to assure hemostasis and the device  was turned back on to monitor plus therapy and the incision was closed with  a layer of 2-0 Vicryl, followed by a layer of 3-0 Vicryl, followed by a  layer of 4-0 Vicryl. Benzoin was painted on the skin. Steri-Strips were  applied and a pressure dressing placed. The patient returned to his room in  satisfactory condition.   COMPLICATIONS:  There were no immediate procedure complications.  RESULTS:  This demonstrated successful removal of a previous Guidant ICD at  [facility] and insertion of a new Guidant ICD (mini 4+ AICD model 1796 serial  number [id number]). There were no immediate procedure complications.                                               Doylene Canning. Ladona Ridgel, M.D. [facility]   GWT/MEDQ  D:  01/29/2003  T:  01/30/2003  Job:  [id number]   cc:   Duke Salvia, M.D. [facility]   Dr. Nathanial Rancher, [place]

## 2011-04-08 NOTE — Discharge Summary (Signed)
Victor [name], Victor [name] NO.:  [id number]   MEDICAL RECORD NO.:  [id number]          PATIENT TYPE:  INP   LOCATION:  3711                         FACILITY:  [facility]   PHYSICIAN:  Victor [name], M.D.  DATE OF BIRTH:  [birth year]   DATE OF ADMISSION:  08/13/2004  DATE OF DISCHARGE:  08/14/2004                                 DISCHARGE SUMMARY   DISCHARGE DIAGNOSES:  1.  Status post removal of abdominal implantable cardioverter defibrillator      device, with insertion in the left subclavian area of the Guidant      VITALITY DS VR, model T135, serial number [id number].  2.  End of life for previous generator which was placed in 1997.  3.  History of nonsustained ventricular tachycardia associated with syncope      which required resuscitation.  4.  Inducible ventricular tachycardia at electrophysiology study.  5.  Status post implantable cardioverter defibrillator placed in 1990,      second in 1994, third in 1997.  6.  Type 2 diabetes.  7.  Obesity.  8.  Ejection fraction 30-35%.  9.  Prior inferior myocardial infarction.   PROCEDURE:  Removal of implantable cardioverter defibrillator generator and  capping of epicardial abdominal leads with patch, insertion of new  transvenous implantable cardioverter defibrillator system by way of the left  subclavian vein, Dr. Lewayne Bunting.  Defibrillation testing rated less than  or equal to 14 joules.   DISCHARGE DISPOSITION:  The patient tolerated the procedure well.  He has  had mobility issues in the left upper extremity explained to him before  discharge.  He knows that he should keep incisions both in the abdomen and  in the left chest clean and dry for the next week.  He will be able to show  beginning Friday August 20, 2004.  He is asked not to drive for the next  10 days.  He is to call [phone] if he has any problems such as fever,  chills, increased pain at the ICD site, or swelling at the pacer pocket.   He goes  home on the following medications:  1.  Enteric coated aspirin 325 mg daily.  2.  Humulin 70/30, 40 units in the morning, 30 units in the evening.  3.  Glucophage 850 mg b.i.d.  4.  Neurontin 300 mg t.i.d.  5.  Nexium 40 mg daily.  6.  Antibiotic Keflex 500 mg b.i.d. for five days.   DISCHARGE PAIN MEDICATION:  Tylenol 325 mg 1-2 tabs q.4-6 h. as needed for  pain.   He is asked not to drive for the next 10 days.   BRIEF HISTORY:  Victor [name] is a 75 year old male with a history of ischemic  cardiomyopathy, inducible ventricular tachycardia.  He is status post three  separate implantations of ICD from 1990 to the present.  He had a generator  change about 1-1/2 years ago, but now found to have a nonfunctioning device.  The patient noted the device had been beeping for about six months.  On  interrogation, battery seemed fine.  He is now admitted for removal of an  abdominal generator and capping of abdominal leads.  He will have insertion  of a new generator by way of left subclavian transvenous system.  Plan is to  remove the abdominal device, cap the leads which are 73 years old, patch  this area in the abdomen and proceed with a standard implant of ICD by  Guidant.   HOSPITAL COURSE:  The patient has tolerated the procedure well.  Ready for  discharge postprocedure day one.  He goes home with the medications and  followup as dictated.  He will have an appointment at [facility]  Thursday September 09, 2004 at 9:30, and he will follow up with Dr. Lewayne Bunting in three months.  Dr. Lubertha Basque office will call with that  appointment.  Once again, he goes home on Keflex 500 mg b.i.d. for five  days.       GM/MEDQ  D:  08/13/2004  T:  08/15/2004  Job:  [id number]   cc:   Victor [name], M.D.   [place], M.D.  [facility]

## 2011-04-08 NOTE — Assessment & Plan Note (Signed)
[facility]                         ELECTROPHYSIOLOGY OFFICE NOTE   NAYSHAWN, MESTA                       MRN:          [id number]  DATE:12/01/2006                            DOB:          [birth year]    Mr. Bozard returns today for followup.  He is a very pleasant middle-  aged male with ischemic cardiomyopathy and ventricular tachycardia,  status post ICD insertion.  He returns today for followup.  He has had  some back problems and has been evaluated by Dr. Montez Morita, here in  [place], for this.  He denies chest pain or shortness of breath.   His medications include aspirin, Glucophage, Nexium, Humulin, Lyrica,  furosemide, potassium, Skelaxin and Etodolac.   EXAM:  He is a pleasant, well-appearing, middle-aged man, in no acute  distress.  The blood pressure was 153/79, the pulse 73 and regular, the  respirations were 18, the weight was 219 pounds.  NECK:  Revealed no jugular venous distention.  There was a soft carotid  bruit on the right.  The trachea was midline.  There was no thyromegaly.  LUNGS:  Clear bilaterally to auscultation.  There were no wheezes, rales  or rhonchi.  CARDIOVASCULAR EXAM:  Revealed a regular rate and rhythm with normal S1  and S2.  There were no murmurs, rubs or gallops appreciated.  EXTREMITIES:  Demonstrated trace peripheral edema bilaterally.   The EKG demonstrates sinus rhythm with PACs.   Interrogation of his defibrillator demonstrates a Guidant Vitality DS  with no intercurrent ICD therapies.  The R-waves were 11, the impedance  675 ohms, the threshold 0.6 at 0.5 milliseconds.  The battery voltage is  3.14 volts.   IMPRESSION:  1. Ischemic cardiomyopathy.  2. Ventricular tachycardia.  3. Diabetes.  4. Degenerative joint disease with multiple arthritic problems.   DISCUSSION:  [name] is not on a beta blocker.  The old chart has  been reviewed and there is no clear indication as to why he is not.   His  wife states that he may have had some problems with fatigue on beta  blockers in the past, but they are uncertain of this at the present  time.  For this reason, I recommend that we start him on carvedilol  3.125 mg twice daily.  If he tolerates this okay, then we would also  consider adding an ACE inhibitor when I see him back in the office.  Because of his carotid bruit and reported carotid calcification and  other vascular  calcification, I have recommended that we go ahead and obtain a carotid  ultrasound to better  evaluate his carotid bruit.  I will plan to see him back in the office  in three months.  He will forego his latitude check in three months.     Doylene Canning. Ladona Ridgel, MD  Electronically Signed    GWT/MedQ  DD: 12/01/2006  DT: 12/01/2006  Job #: [id number]

## 2011-04-25 ENCOUNTER — Encounter: Payer: Self-pay | Admitting: Internal Medicine

## 2011-05-13 ENCOUNTER — Encounter: Payer: Managed Care, Other (non HMO) | Admitting: Internal Medicine

## 2011-05-18 ENCOUNTER — Encounter: Payer: Self-pay | Admitting: Internal Medicine

## 2011-08-29 ENCOUNTER — Ambulatory Visit (INDEPENDENT_AMBULATORY_CARE_PROVIDER_SITE_OTHER): Payer: Medicare Other | Admitting: *Deleted

## 2011-08-29 ENCOUNTER — Encounter: Payer: Self-pay | Admitting: Internal Medicine

## 2011-08-29 DIAGNOSIS — I472 Ventricular tachycardia: Secondary | ICD-10-CM

## 2011-08-29 DIAGNOSIS — I509 Heart failure, unspecified: Secondary | ICD-10-CM

## 2011-08-29 LAB — ICD DEVICE OBSERVATION
BATTERY VOLTAGE: 2.51 V
BRDY-0002RV: 40 {beats}/min
RV LEAD THRESHOLD: 0.6 V
TZAT-0001SLOWVT: 1
TZAT-0002FASTVT: NEGATIVE
TZAT-0004FASTVT: 8
TZAT-0004SLOWVT: 8
TZAT-0004SLOWVT: 8
TZAT-0005FASTVT: 81 pct
TZAT-0005SLOWVT: 81 pct
TZAT-0012FASTVT: 200 ms
TZAT-0012SLOWVT: 200 ms
TZAT-0018FASTVT: NEGATIVE
TZAT-0018SLOWVT: NEGATIVE
TZAT-0019FASTVT: 7.5 V
TZAT-0019SLOWVT: 7.5 V
TZAT-0020FASTVT: 1 ms
TZAT-0020SLOWVT: 1 ms
TZON-0003FASTVT: 308 ms
TZON-0003SLOWVT: 363 ms
TZON-0004SLOWVT: 2.5
TZON-0005SLOWVT: 1
TZST-0001FASTVT: 4
TZST-0001FASTVT: 5
TZST-0001FASTVT: 7
TZST-0001SLOWVT: 4
TZST-0001SLOWVT: 6
TZST-0001SLOWVT: 7
TZST-0003FASTVT: 17 J
TZST-0003FASTVT: 21 J
TZST-0003FASTVT: 31 J
TZST-0003SLOWVT: 21 J
TZST-0003SLOWVT: 31 J
TZST-0003SLOWVT: 31 J
VENTRICULAR PACING ICD: 0 pct

## 2011-08-29 NOTE — Progress Notes (Signed)
ICD check 

## 2011-09-29 ENCOUNTER — Ambulatory Visit (INDEPENDENT_AMBULATORY_CARE_PROVIDER_SITE_OTHER): Payer: Medicare Other | Admitting: Internal Medicine

## 2011-09-29 ENCOUNTER — Encounter: Payer: Self-pay | Admitting: Internal Medicine

## 2011-09-29 DIAGNOSIS — I2589 Other forms of chronic ischemic heart disease: Secondary | ICD-10-CM

## 2011-09-29 DIAGNOSIS — Z95810 Presence of automatic (implantable) cardiac defibrillator: Secondary | ICD-10-CM

## 2011-09-29 LAB — CBC WITH DIFFERENTIAL/PLATELET
Basophils Relative: 0.4 % (ref 0.0–3.0)
Hemoglobin: 12.7 g/dL — ABNORMAL LOW (ref 13.0–17.0)
Lymphocytes Relative: 32.7 % (ref 12.0–46.0)
MCHC: 33.5 g/dL (ref 30.0–36.0)
Monocytes Absolute: 0.6 10*3/uL (ref 0.1–1.0)
Monocytes Relative: 6.8 % (ref 3.0–12.0)
Neutro Abs: 5.2 10*3/uL (ref 1.4–7.7)
Platelets: 137 10*3/uL — ABNORMAL LOW (ref 150.0–400.0)
RBC: 4.3 Mil/uL (ref 4.22–5.81)
RDW: 14 % (ref 11.5–14.6)

## 2011-09-29 LAB — BASIC METABOLIC PANEL
BUN: 22 mg/dL (ref 6–23)
CO2: 26 mEq/L (ref 19–32)
Glucose, Bld: 107 mg/dL — ABNORMAL HIGH (ref 70–99)
Potassium: 3.9 mEq/L (ref 3.5–5.1)
Sodium: 141 mEq/L (ref 135–145)

## 2011-09-29 LAB — PROTIME-INR
INR: 1 ratio (ref 0.8–1.0)
Prothrombin Time: 10.7 s (ref 10.2–12.4)

## 2011-09-29 NOTE — Patient Instructions (Signed)
Your physician has recommended that you have a defibrillator generator change. Please see the instruction sheet given to you today for more information.  Your physician recommends that you continue on your current medications as directed. Please refer to the Current Medication list given to you today.

## 2011-09-29 NOTE — Assessment & Plan Note (Signed)
His device is working normally but is at elective replacement. We'll schedule ICD generator change in 2.

## 2011-09-29 NOTE — Progress Notes (Signed)
Addended bySherri Rad C on: 09/29/2011 11:21 AM   Modules accepted: Orders

## 2011-09-29 NOTE — Progress Notes (Signed)
HPI [name] returns today for followup. He is a pleasant 75 yo man with a h/o VT and ICM, s/p ICD implant. He denies c/p or sob. No edema. No ICD shocks. No Known Allergies   Current Outpatient Prescriptions  Medication Sig Dispense Refill  . aspirin (ASPIR-81) 81 MG EC tablet Take 81 mg by mouth daily.        . furosemide (LASIX) 20 MG tablet Take 20 mg by mouth daily.        . HYDROcodone-acetaminophen (LORCET) 10-650 MG per tablet Take 1 tablet by mouth as needed.        . insulin NPH-insulin regular (HUMULIN 70/30) (70-30) 100 UNIT/ML injection Inject 10 Units into the skin 2 (two) times daily with a meal.       . metaxalone (SKELAXIN) 800 MG tablet Take 800 mg by mouth 2 (two) times daily.        . metFORMIN (GLUCOPHAGE) 850 MG tablet Take 850 mg by mouth 2 (two) times daily.        . Omega-3 Fatty Acids (FISH OIL) 1000 MG CAPS Take by mouth daily.        . pantoprazole (PROTONIX) 40 MG tablet Take 40 mg by mouth daily.        . potassium chloride (KLOR-CON) 20 MEQ packet Take 20 mEq by mouth every other day.        . pravastatin (PRAVACHOL) 40 MG tablet Take 40 mg by mouth daily.        . pregabalin (LYRICA) 75 MG capsule Take 75 mg by mouth 2 (two) times daily. Take 2          Past Medical History  Diagnosis Date  . Osteoarthrosis, unspecified whether generalized or localized, unspecified site   . Type II or unspecified type diabetes mellitus without mention of complication, not stated as uncontrolled   . Arthropathy, unspecified, site unspecified   . Occlusion and stenosis of carotid artery without mention of cerebral infarction   . Raynaud's syndrome   . Congestive heart failure, unspecified   . Backache, unspecified   . Automatic implantable cardiac defibrillator in situ   . Paroxysmal ventricular tachycardia   . Other specified forms of chronic ischemic heart disease   . Degenerative joint disease   . DM (diabetes mellitus)   . Peripheral vascular disease   .  Cardiomyopathy, ischemic     ROS:   All systems reviewed and negative except as noted in the HPI.   Past Surgical History  Procedure Date  . Anal fistulotomy      Family History  Problem Relation Age of Onset  . Heart failure Mother   . Heart attack Father   . Leukemia Sister      History   Social History  . Marital Status: Married    Spouse Name: N/A    Number of Children: N/A  . Years of Education: N/A   Occupational History  . Not on file.   Social History Main Topics  . Smoking status: Never Smoker   . Smokeless tobacco: Not on file  . Alcohol Use: Yes     rare  . Drug Use:   . Sexually Active:    Other Topics Concern  . 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 Has 3 daughters. Enjoys working in his wood shop.        BP 114/74  Pulse 91  Ht 5' 8" (1.727 m)  Wt 182 lb (82.555 kg)  BMI 27.67 kg/m2  Physical Exam:  Well appearing NAD HEENT: Unremarkable Neck:  No JVD, no thyromegally Lymphatics:  No adenopathy Back:  No CVA tenderness Lungs:  Clear no wheezes, rales, or rhonchi. HEART:  Regular rate rhythm, no murmurs, no rubs, no clicks Abd:  soft, positive bowel sounds, no organomegally, no rebound, no guarding Ext:  2 plus pulses, no edema, no cyanosis, no clubbing Skin:  No rashes no nodules Neuro:  CN II through XII intact, motor grossly intact  DEVICE  Normal device function.  See PaceArt for details. Device at ERI.  Assess/Plan:   

## 2011-09-29 NOTE — Assessment & Plan Note (Signed)
He remains active. He denies chest pain or shortness of breath. He'll continue his current medical therapy.

## 2011-09-30 ENCOUNTER — Encounter (HOSPITAL_COMMUNITY): Payer: Self-pay | Admitting: Pharmacy Technician

## 2011-10-05 ENCOUNTER — Ambulatory Visit (HOSPITAL_COMMUNITY): Admit: 2011-10-05 | Payer: Self-pay | Admitting: Internal Medicine

## 2011-10-05 ENCOUNTER — Ambulatory Visit (HOSPITAL_COMMUNITY)
Admission: RE | Admit: 2011-10-05 | Discharge: 2011-10-05 | Disposition: A | Discharge status: Home / Self Care | Payer: Medicare Other | Source: Ambulatory Visit | Attending: Internal Medicine | Admitting: Internal Medicine

## 2011-10-05 ENCOUNTER — Encounter (HOSPITAL_COMMUNITY)
Admission: RE | Disposition: A | Discharge status: Home / Self Care | Payer: Self-pay | Source: Ambulatory Visit | Attending: Internal Medicine

## 2011-10-05 ENCOUNTER — Ambulatory Visit (HOSPITAL_COMMUNITY): Payer: Medicare Other

## 2011-10-05 DIAGNOSIS — I509 Heart failure, unspecified: Secondary | ICD-10-CM | POA: Insufficient documentation

## 2011-10-05 DIAGNOSIS — I2589 Other forms of chronic ischemic heart disease: Secondary | ICD-10-CM | POA: Insufficient documentation

## 2011-10-05 DIAGNOSIS — I472 Ventricular tachycardia: Secondary | ICD-10-CM

## 2011-10-05 DIAGNOSIS — Z4502 Encounter for adjustment and management of automatic implantable cardiac defibrillator: Secondary | ICD-10-CM | POA: Insufficient documentation

## 2011-10-05 DIAGNOSIS — E119 Type 2 diabetes mellitus without complications: Secondary | ICD-10-CM | POA: Insufficient documentation

## 2011-10-05 HISTORY — PX: IMPLANTABLE CARDIOVERTER DEFIBRILLATOR (ICD) GENERATOR CHANGE: SHX5469

## 2011-10-05 LAB — GLUCOSE, CAPILLARY: Glucose-Capillary: 122 mg/dL — ABNORMAL HIGH (ref 70–99)

## 2011-10-05 SURGERY — ICD GENERATOR CHANGE
Anesthesia: LOCAL

## 2011-10-05 SURGERY — ICD GENERATOR CHANGE
Anesthesia: Moderate Sedation | Laterality: Left

## 2011-10-05 MED ORDER — ACETAMINOPHEN 325 MG PO TABS: 325.0000 mg | ORAL_TABLET | ORAL | Status: DC | PRN

## 2011-10-05 MED ORDER — ACETAMINOPHEN 500 MG PO TABS: 1000.0000 mg | ORAL_TABLET | Freq: Four times a day (QID) | ORAL | Status: DC

## 2011-10-05 MED ORDER — MIDAZOLAM HCL 5 MG/5ML IJ SOLN
INTRAMUSCULAR | Status: AC
  Filled 2011-10-05: qty 5

## 2011-10-05 MED ORDER — SODIUM CHLORIDE 0.45 % IV SOLN
INTRAVENOUS | Status: DC
  Administered 2011-10-05: 15:00:00 via INTRAVENOUS

## 2011-10-05 MED ORDER — ONDANSETRON HCL 4 MG/2ML IJ SOLN: 4.0000 mg | Freq: Four times a day (QID) | INTRAMUSCULAR | Status: DC | PRN

## 2011-10-05 MED ORDER — FENTANYL CITRATE 0.05 MG/ML IJ SOLN: 25.0000 ug | INTRAMUSCULAR | Status: DC | PRN

## 2011-10-05 MED ORDER — MUPIROCIN 2 % EX OINT
TOPICAL_OINTMENT | CUTANEOUS | Status: AC
  Administered 2011-10-05: 1
  Filled 2011-10-05: qty 22

## 2011-10-05 MED ORDER — SODIUM CHLORIDE 0.9 % IR SOLN
80.0000 mg | Status: DC
  Filled 2011-10-05: qty 2

## 2011-10-05 MED ORDER — FENTANYL CITRATE 0.05 MG/ML IJ SOLN
INTRAMUSCULAR | Status: AC
  Filled 2011-10-05: qty 2

## 2011-10-05 MED ORDER — CEFAZOLIN SODIUM-DEXTROSE 2-3 GM-% IV SOLR
2.0000 g | Freq: Once | INTRAVENOUS | Status: DC
  Filled 2011-10-05: qty 50

## 2011-10-05 MED ORDER — LIDOCAINE HCL (PF) 1 % IJ SOLN
INTRAMUSCULAR | Status: AC
  Filled 2011-10-05: qty 60

## 2011-10-05 MED ORDER — SODIUM CHLORIDE 0.9 % IV SOLN: INTRAVENOUS | Status: DC

## 2011-10-05 NOTE — Op Note (Signed)
NAMEALECXANDER, MAINWARING NO.:  [id number]  MEDICAL RECORD NO.:  [id number]  LOCATION:  [facility]                         FACILITY:  [facility]  PHYSICIAN:  Doylene Canning. Ladona Ridgel, MD    DATE OF BIRTH:  [birth year]  DATE OF PROCEDURE:  10/05/2011 DATE OF DISCHARGE:  10/05/2011                              OPERATIVE REPORT   PROCEDURE PERFORMED:  Removal of previously implanted single-chamber defibrillator with defibrillator pocket revision with insertion of a new single-chamber defibrillator with defibrillation threshold testing.  INTRODUCTION:  The patient is a 75 year old man with a longstanding ischemic cardiomyopathy and ventricular tachycardia.  His single-chamber defibrillator has reached elective replacement indication and he is now referred for ICD generator change.  PROCEDURE IN DETAIL:  After informed was obtained, the patient was taken to the diagnostic EP lab in a fasting state.  After usual preparation and draping, intravenous fentanyl and midazolam was given for sedation. 30 mL of lidocaine was infiltrated into the left infraclavicular region. A 7-cm incision was carried out over this region.  Electrocautery was utilized to dissect down to the fascia plane.  Electrocautery was then utilized to free up the ICD generator.  Gentle traction was utilized to remove the generator from its pocket.  Electrocautery was utilized to free up the dense fibrous adhesions.  With the different shaped and sized device,  electrocautery was utilized to revise the pocket to accommodate the larger footprint of the new device.  The old generator was removed and the [employer] single-chamber defibrillator, serial [id number] was connected to the old [employer] defibrillation lead and placed back in the subcutaneous pocket.  The pocket was irrigated with antibiotic irrigation and the incision was closed with 2-0 and 3-0 Vicryl.  At this point, I scrubbed out of the case and  to supervise defibrillation threshold testing.  After the patient was more deeply sedated with fentanyl and Versed, VF was induced with a T-wave shock.  A 17 joule shock was subsequently delivered, which terminated ventricular fibrillation and restored sinus rhythm.  No additional defibrillation threshold testing was carried out and the incision was closed with 2-0 and 3-0 Vicryl.  Benzoin and Steri- Strips were painted on the skin, a pressure dressing was applied, and the patient was returned to his room in satisfactory condition.  COMPLICATIONS:  There are no immediate procedure complications.  RESULTS:  This demonstrates successful removal of previously implanted [employer] single-chamber defibrillator and insertion of a new Environmental manager single-chamber defibrillator with defibrillation threshold testing, and defibrillator pocket revision.     Doylene Canning. Ladona Ridgel, MD     GWT/MEDQ  D:  10/05/2011  T:  10/05/2011  Job:  [id number]

## 2011-10-05 NOTE — Op Note (Signed)
ICD generator removal, ICD pocket revision, ICD generator insertion and ICD DFT testing carried out with out immediate procedure complication. D# [id number].

## 2011-10-05 NOTE — H&P (View-Only) (Signed)
HPI Victor [name] returns today for followup. He is a pleasant 75 yo man with a h/o VT and ICM, s/p ICD implant. He denies c/p or sob. No edema. No ICD shocks. No Known Allergies   Current Outpatient Prescriptions  Medication Sig Dispense Refill  . aspirin (ASPIR-81) 81 MG EC tablet Take 81 mg by mouth daily.        . furosemide (LASIX) 20 MG tablet Take 20 mg by mouth daily.        Marland Kitchen HYDROcodone-acetaminophen (LORCET) 10-650 MG per tablet Take 1 tablet by mouth as needed.        . insulin NPH-insulin regular (HUMULIN 70/30) (70-30) 100 UNIT/ML injection Inject 10 Units into the skin 2 (two) times daily with a meal.       . metaxalone (SKELAXIN) 800 MG tablet Take 800 mg by mouth 2 (two) times daily.        . metFORMIN (GLUCOPHAGE) 850 MG tablet Take 850 mg by mouth 2 (two) times daily.        . Omega-3 Fatty Acids (FISH OIL) 1000 MG CAPS Take by mouth daily.        . pantoprazole (PROTONIX) 40 MG tablet Take 40 mg by mouth daily.        . potassium chloride (KLOR-CON) 20 MEQ packet Take 20 mEq by mouth every other day.        . pravastatin (PRAVACHOL) 40 MG tablet Take 40 mg by mouth daily.        . pregabalin (LYRICA) 75 MG capsule Take 75 mg by mouth 2 (two) times daily. Take 2          Past Medical History  Diagnosis Date  . Osteoarthrosis, unspecified whether generalized or localized, unspecified site   . Type II or unspecified type diabetes mellitus without mention of complication, not stated as uncontrolled   . Arthropathy, unspecified, site unspecified   . Occlusion and stenosis of carotid artery without mention of cerebral infarction   . Raynaud's syndrome   . Congestive heart failure, unspecified   . Backache, unspecified   . Automatic implantable cardiac defibrillator in situ   . Paroxysmal ventricular tachycardia   . Other specified forms of chronic ischemic heart disease   . Degenerative joint disease   . DM (diabetes mellitus)   . Peripheral vascular disease   .  Cardiomyopathy, ischemic     ROS:   All systems reviewed and negative except as noted in the HPI.   Past Surgical History  Procedure Date  . Anal fistulotomy      Family History  Problem Relation Age of Onset  . Heart failure Mother   . Heart attack Father   . Leukemia Sister      History   Social History  . Marital Status: Married    Spouse Name: N/A    Number of Children: N/A  . Years of Education: N/A   Occupational History  . Not on file.   Social History Main Topics  . Smoking status: Never Smoker   . Smokeless tobacco: Not on file  . Alcohol Use: Yes     rare  . Drug Use:   . Sexually Active:    Other Topics Concern  . 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  BP 114/74  Pulse 91  Ht 5\' 8"  (1.727 m)  Wt 182 lb (82.555 kg)  BMI 27.67 kg/m2  Physical Exam:  Well appearing NAD HEENT: Unremarkable Neck:  No JVD, no thyromegally Lymphatics:  No adenopathy Back:  No CVA tenderness Lungs:  Clear no wheezes, rales, or rhonchi. HEART:  Regular rate rhythm, no murmurs, no rubs, no clicks Abd:  soft, positive bowel sounds, no organomegally, no rebound, no guarding Ext:  2 plus pulses, no edema, no cyanosis, no clubbing Skin:  No rashes no nodules Neuro:  CN II through XII intact, motor grossly intact  DEVICE  Normal device function.  See PaceArt for details. Device at [employer].  Assess/Plan:

## 2011-10-05 NOTE — Interval H&P Note (Signed)
History and Physical Interval Note:   10/05/2011   1:26 PM   Victor [name]  has presented today for surgery, with the diagnosis of End of life   The various methods of treatment have been discussed with the patient and family. After consideration of risks, benefits and other options for treatment, the patient has consented to  Procedure(s): ICD GENERATOR CHANGE as a surgical intervention .  The patients' history has been reviewed, patient examined, no change in status, stable for surgery.  I have reviewed the patients' chart and labs.  Questions were answered to the patient's satisfaction.     Lewayne Bunting  MD

## 2011-10-06 ENCOUNTER — Encounter (HOSPITAL_COMMUNITY): Payer: Self-pay

## 2011-10-20 ENCOUNTER — Encounter: Payer: Medicare Other | Admitting: Physician Assistant

## 2011-10-20 ENCOUNTER — Ambulatory Visit (INDEPENDENT_AMBULATORY_CARE_PROVIDER_SITE_OTHER): Payer: Medicare Other | Admitting: *Deleted

## 2011-10-20 DIAGNOSIS — I472 Ventricular tachycardia: Secondary | ICD-10-CM

## 2011-10-20 DIAGNOSIS — I509 Heart failure, unspecified: Secondary | ICD-10-CM

## 2011-10-20 LAB — ICD DEVICE OBSERVATION
DEVICE MODEL ICD: 118170
RV LEAD IMPEDENCE ICD: 499 Ohm
TZON-0003SLOWVT: 363.6 ms

## 2011-10-20 NOTE — Progress Notes (Signed)
Wound check-ICD 

## 2011-11-08 ENCOUNTER — Encounter: Payer: Self-pay | Admitting: Internal Medicine

## 2011-11-23 DIAGNOSIS — E1149 Type 2 diabetes mellitus with other diabetic neurological complication: Secondary | ICD-10-CM | POA: Diagnosis not present

## 2011-11-23 DIAGNOSIS — Z125 Encounter for screening for malignant neoplasm of prostate: Secondary | ICD-10-CM | POA: Diagnosis not present

## 2011-11-23 DIAGNOSIS — Z23 Encounter for immunization: Secondary | ICD-10-CM | POA: Diagnosis not present

## 2011-11-23 DIAGNOSIS — R609 Edema, unspecified: Secondary | ICD-10-CM | POA: Diagnosis not present

## 2011-11-23 DIAGNOSIS — Z79899 Other long term (current) drug therapy: Secondary | ICD-10-CM | POA: Diagnosis not present

## 2011-11-23 DIAGNOSIS — E538 Deficiency of other specified B group vitamins: Secondary | ICD-10-CM | POA: Diagnosis not present

## 2011-11-23 DIAGNOSIS — K219 Gastro-esophageal reflux disease without esophagitis: Secondary | ICD-10-CM | POA: Diagnosis not present

## 2011-11-23 DIAGNOSIS — E7800 Pure hypercholesterolemia, unspecified: Secondary | ICD-10-CM | POA: Diagnosis not present

## 2012-01-19 ENCOUNTER — Encounter: Payer: Self-pay | Admitting: Internal Medicine

## 2012-01-19 ENCOUNTER — Ambulatory Visit (INDEPENDENT_AMBULATORY_CARE_PROVIDER_SITE_OTHER): Payer: Medicare Other | Admitting: Internal Medicine

## 2012-01-19 DIAGNOSIS — Z95810 Presence of automatic (implantable) cardiac defibrillator: Secondary | ICD-10-CM

## 2012-01-19 DIAGNOSIS — I2589 Other forms of chronic ischemic heart disease: Secondary | ICD-10-CM | POA: Diagnosis not present

## 2012-01-19 DIAGNOSIS — I472 Ventricular tachycardia: Secondary | ICD-10-CM | POA: Diagnosis not present

## 2012-01-19 LAB — ICD DEVICE OBSERVATION
DEV-0020ICD: NEGATIVE
HV IMPEDENCE: 50 Ohm
RV LEAD AMPLITUDE: 15 mv
RV LEAD IMPEDENCE ICD: 454 Ohm
RV LEAD THRESHOLD: 0.7 V

## 2012-01-19 NOTE — Assessment & Plan Note (Signed)
He denies anginal symptoms. He will continue his current medical therapy. 

## 2012-01-19 NOTE — Patient Instructions (Signed)
Your physician wants you to follow-up in: 12 months with Dr Court Joy will receive a reminder letter in the mail two months in advance. If you don't receive a letter, please call our office to schedule the follow-up appointment.  Your physician recommends that you schedule a follow-up appointment in: 3 months in the device clinic

## 2012-01-19 NOTE — Assessment & Plan Note (Signed)
Device interrogation today demonstrates normal function.

## 2012-01-19 NOTE — Assessment & Plan Note (Signed)
He has had several nonsustained episodes of ventricular tachycardia. He will continue his current medical therapy.

## 2012-01-19 NOTE — Progress Notes (Signed)
HPI Victor [name] returns today for followup. He is a 76 year old man with an ischemic cardiomyopathy and ventricular tachycardia status post ICD implantation. The patient has done well in the interim. He underwent generator change out several months ago. No trouble healing. The patient denies chest pain or shortness of breath. 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 No Known Allergies   Current Outpatient Prescriptions  Medication Sig Dispense Refill  . aspirin (ASPIR-81) 81 MG EC tablet Take 81 mg by mouth daily.        . furosemide (LASIX) 20 MG tablet Take 20 mg by mouth daily.        Marland Kitchen HYDROcodone-acetaminophen (LORCET) 10-650 MG per tablet Take 1 tablet by mouth as needed. For pain       . insulin NPH-insulin regular (HUMULIN 70/30) (70-30) 100 UNIT/ML injection Inject 12 Units into the skin 2 (two) times daily with a meal.       . metaxalone (SKELAXIN) 800 MG tablet Take 800 mg by mouth 2 (two) times daily.        . metFORMIN (GLUCOPHAGE) 850 MG tablet Take 850 mg by mouth 2 (two) times daily.        . Omega-3 Fatty Acids (FISH OIL) 1000 MG CAPS Take 1,000 mg by mouth daily.       . pantoprazole (PROTONIX) 40 MG tablet Take 40 mg by mouth daily.        . potassium chloride (KLOR-CON) 20 MEQ packet Take 20 mEq by mouth every other day.        . pravastatin (PRAVACHOL) 40 MG tablet Take 40 mg by mouth daily.        . pregabalin (LYRICA) 75 MG capsule Take 150 mg by mouth 2 (two) times daily.          Past Medical History  Diagnosis Date  . Osteoarthrosis, unspecified whether generalized or localized, unspecified site   . Type II or unspecified type diabetes mellitus without mention of complication, not stated as uncontrolled   . Arthropathy, unspecified, site unspecified   . Occlusion and stenosis of carotid artery without mention of cerebral infarction   . Raynaud's syndrome   . Congestive heart failure, unspecified   . Backache, unspecified   . Automatic  implantable cardiac defibrillator in situ   . Paroxysmal ventricular tachycardia   . Other specified forms of chronic ischemic heart disease   . Degenerative joint disease   . DM (diabetes mellitus)   . Peripheral vascular disease   . Cardiomyopathy, ischemic     ROS:   All systems reviewed and negative except as noted in the HPI.   Past Surgical History  Procedure Date  . Anal fistulotomy      Family History  Problem Relation Age of Onset  . Heart failure Mother 68  . Heart attack Father   . Leukemia Sister   . Stomach cancer Maternal Grandfather      History   Social History  . Marital Status: Married    Spouse Name: N/A    Number of Children: N/A  . Years of Education: N/A   Occupational History  . Not on file.   Social History Main Topics  . Smoking status: Never Smoker   . Smokeless tobacco: Not on file  . Alcohol Use: Yes     rare  . Drug Use:   . Sexually Active:    Other Topics Concern  . Not on file   Social History  Narrative   Complete 2 yrs of college, is w Chiropractor, is a Education administrator for [employer], and is married. Has 3 daughters. 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      BP 124/52  Pulse 85  Wt 82.918 kg (182 lb 12.8 oz)  Physical Exam:  Well appearing 76 year old man, NAD HEENT: Unremarkable Neck:  No JVD, no thyromegally Lymphatics:  No adenopathy Back:  No CVA tenderness Lungs:  Clear with no wheezes, rales, or rhonchi. Well-healed ICD incision. HEART:  Regular rate rhythm, no murmurs, no rubs, no clicks Abd:  soft, positive bowel sounds, no organomegally, no rebound, no guarding Ext:  2 plus pulses, no edema, no cyanosis, no clubbing Skin:  No rashes no nodules Neuro:  CN II through XII intact, motor grossly intact  DEVICE  Normal device function.  See PaceArt for details.   Assess/Plan:

## 2012-01-30 DIAGNOSIS — M5137 Other intervertebral disc degeneration, lumbosacral region: Secondary | ICD-10-CM | POA: Diagnosis not present

## 2012-02-02 DIAGNOSIS — H2510 Age-related nuclear cataract, unspecified eye: Secondary | ICD-10-CM | POA: Diagnosis not present

## 2012-04-04 DIAGNOSIS — M5137 Other intervertebral disc degeneration, lumbosacral region: Secondary | ICD-10-CM | POA: Diagnosis not present

## 2012-04-18 ENCOUNTER — Encounter: Payer: Self-pay | Admitting: Internal Medicine

## 2012-04-18 ENCOUNTER — Ambulatory Visit (INDEPENDENT_AMBULATORY_CARE_PROVIDER_SITE_OTHER): Payer: Medicare Other | Admitting: *Deleted

## 2012-04-18 DIAGNOSIS — I2589 Other forms of chronic ischemic heart disease: Secondary | ICD-10-CM | POA: Diagnosis not present

## 2012-04-18 DIAGNOSIS — I509 Heart failure, unspecified: Secondary | ICD-10-CM

## 2012-04-18 DIAGNOSIS — I472 Ventricular tachycardia: Secondary | ICD-10-CM

## 2012-04-18 LAB — ICD DEVICE OBSERVATION
DEV-0020ICD: NEGATIVE
DEVICE MODEL ICD: 118170
HV IMPEDENCE: 50 Ohm
RV LEAD IMPEDENCE ICD: 426 Ohm
TZON-0003FASTVT: 307.6 ms
TZON-0003SLOWVT: 363.6 ms

## 2012-04-18 NOTE — Progress Notes (Signed)
ICD check 

## 2012-05-28 DIAGNOSIS — R609 Edema, unspecified: Secondary | ICD-10-CM | POA: Diagnosis not present

## 2012-05-28 DIAGNOSIS — E7800 Pure hypercholesterolemia, unspecified: Secondary | ICD-10-CM | POA: Diagnosis not present

## 2012-05-28 DIAGNOSIS — K219 Gastro-esophageal reflux disease without esophagitis: Secondary | ICD-10-CM | POA: Diagnosis not present

## 2012-05-28 DIAGNOSIS — E1149 Type 2 diabetes mellitus with other diabetic neurological complication: Secondary | ICD-10-CM | POA: Diagnosis not present

## 2012-05-28 DIAGNOSIS — E538 Deficiency of other specified B group vitamins: Secondary | ICD-10-CM | POA: Diagnosis not present

## 2012-07-19 ENCOUNTER — Encounter: Payer: Self-pay | Admitting: Internal Medicine

## 2012-07-19 ENCOUNTER — Encounter: Payer: Self-pay | Admitting: Cardiology

## 2012-07-19 ENCOUNTER — Ambulatory Visit (INDEPENDENT_AMBULATORY_CARE_PROVIDER_SITE_OTHER): Payer: Medicare Other | Admitting: Cardiology

## 2012-07-19 VITALS — BP 119/49 | HR 81 | Ht 68.0 in | Wt 179.0 lb

## 2012-07-19 DIAGNOSIS — I4729 Other ventricular tachycardia: Secondary | ICD-10-CM

## 2012-07-19 DIAGNOSIS — Z95810 Presence of automatic (implantable) cardiac defibrillator: Secondary | ICD-10-CM

## 2012-07-19 DIAGNOSIS — I472 Ventricular tachycardia: Secondary | ICD-10-CM

## 2012-07-19 NOTE — Progress Notes (Signed)
Device check only. See PaceArt report. 

## 2012-07-25 ENCOUNTER — Other Ambulatory Visit: Payer: Self-pay | Admitting: Orthopedic Surgery

## 2012-07-25 DIAGNOSIS — M5137 Other intervertebral disc degeneration, lumbosacral region: Secondary | ICD-10-CM | POA: Diagnosis not present

## 2012-07-25 DIAGNOSIS — M545 Low back pain: Secondary | ICD-10-CM

## 2012-07-26 ENCOUNTER — Ambulatory Visit
Admission: RE | Admit: 2012-07-26 | Discharge: 2012-07-26 | Disposition: A | Discharge status: Home / Self Care | Payer: Medicare Other | Source: Ambulatory Visit | Attending: Orthopedic Surgery | Admitting: Orthopedic Surgery

## 2012-07-26 DIAGNOSIS — M545 Low back pain: Secondary | ICD-10-CM

## 2012-08-22 ENCOUNTER — Other Ambulatory Visit (HOSPITAL_COMMUNITY): Payer: Self-pay | Admitting: Orthopedic Surgery

## 2012-08-22 ENCOUNTER — Other Ambulatory Visit: Payer: Self-pay | Admitting: Orthopedic Surgery

## 2012-08-22 ENCOUNTER — Ambulatory Visit
Admission: RE | Admit: 2012-08-22 | Discharge: 2012-08-22 | Disposition: A | Discharge status: Home / Self Care | Payer: Medicare Other | Source: Ambulatory Visit | Attending: Orthopedic Surgery | Admitting: Orthopedic Surgery

## 2012-08-22 DIAGNOSIS — M545 Low back pain, unspecified: Secondary | ICD-10-CM

## 2012-08-22 DIAGNOSIS — M5137 Other intervertebral disc degeneration, lumbosacral region: Secondary | ICD-10-CM | POA: Diagnosis not present

## 2012-08-22 DIAGNOSIS — M2569 Stiffness of other specified joint, not elsewhere classified: Secondary | ICD-10-CM

## 2012-08-22 DIAGNOSIS — C799 Secondary malignant neoplasm of unspecified site: Secondary | ICD-10-CM

## 2012-08-22 DIAGNOSIS — M519 Unspecified thoracic, thoracolumbar and lumbosacral intervertebral disc disorder: Secondary | ICD-10-CM | POA: Diagnosis not present

## 2012-08-22 DIAGNOSIS — C801 Malignant (primary) neoplasm, unspecified: Secondary | ICD-10-CM | POA: Diagnosis not present

## 2012-08-24 ENCOUNTER — Encounter (HOSPITAL_COMMUNITY)
Admission: RE | Admit: 2012-08-24 | Discharge: 2012-08-24 | Disposition: A | Discharge status: Home / Self Care | Payer: Medicare Other | Source: Ambulatory Visit | Attending: Orthopedic Surgery | Admitting: Orthopedic Surgery

## 2012-08-24 DIAGNOSIS — M2569 Stiffness of other specified joint, not elsewhere classified: Secondary | ICD-10-CM

## 2012-08-24 DIAGNOSIS — M545 Low back pain, unspecified: Secondary | ICD-10-CM

## 2012-08-24 DIAGNOSIS — M5380 Other specified dorsopathies, site unspecified: Secondary | ICD-10-CM | POA: Diagnosis not present

## 2012-08-24 DIAGNOSIS — M19079 Primary osteoarthritis, unspecified ankle and foot: Secondary | ICD-10-CM | POA: Diagnosis not present

## 2012-08-24 MED ORDER — TECHNETIUM TC 99M MEDRONATE IV KIT
25.0000 | PACK | Freq: Once | INTRAVENOUS | Status: AC | PRN
  Administered 2012-08-24: 25 via INTRAVENOUS

## 2012-08-29 DIAGNOSIS — M5137 Other intervertebral disc degeneration, lumbosacral region: Secondary | ICD-10-CM | POA: Diagnosis not present

## 2012-10-10 DIAGNOSIS — M5137 Other intervertebral disc degeneration, lumbosacral region: Secondary | ICD-10-CM | POA: Diagnosis not present

## 2012-10-17 DIAGNOSIS — Z23 Encounter for immunization: Secondary | ICD-10-CM | POA: Diagnosis not present

## 2012-10-19 ENCOUNTER — Encounter: Payer: Self-pay | Admitting: *Deleted

## 2012-10-22 ENCOUNTER — Encounter: Payer: Self-pay | Admitting: Internal Medicine

## 2012-10-22 ENCOUNTER — Ambulatory Visit (INDEPENDENT_AMBULATORY_CARE_PROVIDER_SITE_OTHER): Payer: Medicare Other | Admitting: *Deleted

## 2012-10-22 DIAGNOSIS — I509 Heart failure, unspecified: Secondary | ICD-10-CM

## 2012-10-22 DIAGNOSIS — I472 Ventricular tachycardia: Secondary | ICD-10-CM | POA: Diagnosis not present

## 2012-10-22 LAB — ICD DEVICE OBSERVATION
DEV-0020ICD: NEGATIVE
DEVICE MODEL ICD: 118170
HV IMPEDENCE: 54 Ohm
RV LEAD THRESHOLD: 0.7 V
TZON-0003FASTVT: 307.6 ms

## 2012-10-22 NOTE — Progress Notes (Signed)
ICD check 

## 2012-10-22 NOTE — Patient Instructions (Addendum)
Return office visit 01/22/13 @ 3:00pm with Dr. Ladona Ridgel.

## 2012-11-23 DIAGNOSIS — D320 Benign neoplasm of cerebral meninges: Secondary | ICD-10-CM | POA: Diagnosis not present

## 2012-11-28 DIAGNOSIS — Z125 Encounter for screening for malignant neoplasm of prostate: Secondary | ICD-10-CM | POA: Diagnosis not present

## 2012-11-28 DIAGNOSIS — E1149 Type 2 diabetes mellitus with other diabetic neurological complication: Secondary | ICD-10-CM | POA: Diagnosis not present

## 2012-11-28 DIAGNOSIS — S0560XA Penetrating wound without foreign body of unspecified eyeball, initial encounter: Secondary | ICD-10-CM | POA: Diagnosis not present

## 2012-11-28 DIAGNOSIS — E7800 Pure hypercholesterolemia, unspecified: Secondary | ICD-10-CM | POA: Diagnosis not present

## 2012-11-28 DIAGNOSIS — K219 Gastro-esophageal reflux disease without esophagitis: Secondary | ICD-10-CM | POA: Diagnosis not present

## 2012-11-28 DIAGNOSIS — E538 Deficiency of other specified B group vitamins: Secondary | ICD-10-CM | POA: Diagnosis not present

## 2012-11-28 DIAGNOSIS — R5381 Other malaise: Secondary | ICD-10-CM | POA: Diagnosis not present

## 2013-01-22 ENCOUNTER — Ambulatory Visit (INDEPENDENT_AMBULATORY_CARE_PROVIDER_SITE_OTHER): Payer: Medicare Other | Admitting: Internal Medicine

## 2013-01-22 ENCOUNTER — Encounter: Payer: Self-pay | Admitting: Internal Medicine

## 2013-01-22 VITALS — BP 161/99 | HR 91 | Ht 68.0 in | Wt 183.2 lb

## 2013-01-22 DIAGNOSIS — I472 Ventricular tachycardia: Secondary | ICD-10-CM

## 2013-01-22 DIAGNOSIS — I2589 Other forms of chronic ischemic heart disease: Secondary | ICD-10-CM | POA: Diagnosis not present

## 2013-01-22 DIAGNOSIS — Z95810 Presence of automatic (implantable) cardiac defibrillator: Secondary | ICD-10-CM | POA: Diagnosis not present

## 2013-01-22 DIAGNOSIS — M5137 Other intervertebral disc degeneration, lumbosacral region: Secondary | ICD-10-CM | POA: Diagnosis not present

## 2013-01-22 LAB — ICD DEVICE OBSERVATION
CHARGE TIME: 9.1 s
DEVICE MODEL ICD: 118170
RV LEAD AMPLITUDE: 18.3 mv
RV LEAD IMPEDENCE ICD: 464 Ohm
TZON-0003SLOWVT: 363.6 ms
VENTRICULAR PACING ICD: 1 pct

## 2013-01-22 NOTE — Patient Instructions (Addendum)
Your physician recommends that you schedule a follow-up appointment in: 3 months with device clinic and 12 months with Dr Taylor  

## 2013-01-22 NOTE — Progress Notes (Signed)
HPI He is a 77 year old man with an ischemic cardiomyopathy and chronic systolic heart failure, ventricular tachycardia, status post ICD implantation. In the interim, he has done well. He denies chest pain or shortness of breath. No syncope. His heart failure symptoms are class II. He remains active caring for up to 12 horses. No Known Allergies   Current Outpatient Prescriptions  Medication Sig Dispense Refill  . aspirin (ASPIR-81) 81 MG EC tablet Take 81 mg by mouth daily.        . cilostazol (PLETAL) 100 MG tablet Take 100 mg by mouth 2 (two) times daily.      Marland Kitchen etodolac (LODINE) 400 MG tablet Take 400 mg by mouth daily.       . furosemide (LASIX) 20 MG tablet Take 20 mg by mouth daily.        Marland Kitchen HYDROcodone-acetaminophen (LORCET) 10-650 MG per tablet Take 1 tablet by mouth as needed. For pain       . insulin NPH-insulin regular (HUMULIN 70/30) (70-30) 100 UNIT/ML injection Inject 30 Units into the skin 2 (two) times daily with a meal. 30units in the am and 20units in the pm      . metaxalone (SKELAXIN) 800 MG tablet Take 800 mg by mouth 2 (two) times daily.        . metFORMIN (GLUCOPHAGE) 1000 MG tablet Take 1,000 mg by mouth 2 (two) times daily with a meal.      . potassium chloride (KLOR-CON) 20 MEQ packet Take 20 mEq by mouth every other day.        . pravastatin (PRAVACHOL) 40 MG tablet Take 40 mg by mouth daily.        . pregabalin (LYRICA) 75 MG capsule Take 150 mg by mouth daily.       . vitamin B-12 (CYANOCOBALAMIN) 1000 MCG tablet Take 1,000 mcg by mouth daily.       No current facility-administered medications for this visit.     Past Medical History  Diagnosis Date  . Osteoarthrosis, unspecified whether generalized or localized, unspecified site   . Type II or unspecified type diabetes mellitus without mention of complication, not stated as uncontrolled   . Arthropathy, unspecified, site unspecified   . Occlusion and stenosis of carotid artery without mention of cerebral  infarction   . Raynaud's syndrome   . Congestive heart failure, unspecified   . Backache, unspecified   . Automatic implantable cardiac defibrillator in situ   . Paroxysmal ventricular tachycardia   . Other specified forms of chronic ischemic heart disease   . Degenerative joint disease   . DM (diabetes mellitus)   . Peripheral vascular disease   . Cardiomyopathy, ischemic     ROS:   All systems reviewed and negative except as noted in the HPI.   Past Surgical History  Procedure Laterality Date  . Anal fistulotomy       Family History  Problem Relation Age of Onset  . Heart failure Mother 78  . Heart attack Father   . Leukemia Sister   . Stomach cancer Maternal Grandfather      History   Social History  . Marital Status: Married    Spouse Name: N/A    Number of Children: N/A  . Years of Education: N/A   Occupational History  . Not on file.   Social History Main Topics  . Smoking status: Never Smoker   . Smokeless tobacco: Not on file  . Alcohol Use: Yes  Comment: rare  . Drug Use:   . Sexually Active:    Other Topics Concern  . 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      BP 161/99  Pulse 91  Ht 5\' 8"  (1.727 m)  Wt 183 lb 3.2 oz (83.099 kg)  BMI 27.86 kg/m2  Physical Exam:  Well appearing 77 year old man,NAD HEENT: Unremarkable Neck:  No JVD, no thyromegally Lungs:  Clear with no wheezes, rales, or rhonchi. HEART:  Regular rate rhythm, no murmurs, no rubs, no clicks Abd:  soft, positive bowel sounds, no organomegally, no rebound, no guarding Ext:  2 plus pulses, no edema, no cyanosis, no clubbing Skin:  No rashes no nodules Neuro:  CN II through XII intact, motor grossly intact  EKG - normal sinus rhythm with prior inferior MI  DEVICE  Normal device function.  See PaceArt for  details.   Assess/Plan:

## 2013-01-22 NOTE — Assessment & Plan Note (Signed)
Despite being quite active,he denies anginal symptoms. When he exerts himself, she will fill shortness of breath, which resolves with rest. I've asked the patient to call us if his symptoms worsen.

## 2013-01-22 NOTE — Assessment & Plan Note (Signed)
Interrogation of his ICD demonstrates no sustained ventricular tachycardia. He does have nonsustained episodes which are asymptomatic. No change in medical therapy.

## 2013-01-22 NOTE — Assessment & Plan Note (Signed)
His [employer] defibrillator is working normally. We'll plan to recheck in several months.

## 2013-04-23 DIAGNOSIS — M5137 Other intervertebral disc degeneration, lumbosacral region: Secondary | ICD-10-CM | POA: Diagnosis not present

## 2013-04-24 ENCOUNTER — Ambulatory Visit (INDEPENDENT_AMBULATORY_CARE_PROVIDER_SITE_OTHER): Payer: Medicare Other | Admitting: Internal Medicine

## 2013-04-24 ENCOUNTER — Inpatient Hospital Stay (HOSPITAL_COMMUNITY)
Admission: AD | Admit: 2013-04-24 | Discharge: 2013-04-29 | DRG: 246 | Disposition: A | Discharge status: Home / Self Care | Payer: Medicare Other | Source: Ambulatory Visit | Attending: Internal Medicine | Admitting: Internal Medicine

## 2013-04-24 ENCOUNTER — Other Ambulatory Visit: Payer: Self-pay

## 2013-04-24 ENCOUNTER — Encounter (HOSPITAL_COMMUNITY): Payer: Self-pay | Admitting: General Practice

## 2013-04-24 ENCOUNTER — Encounter: Payer: Self-pay | Admitting: Internal Medicine

## 2013-04-24 ENCOUNTER — Encounter: Payer: Medicare Other | Admitting: *Deleted

## 2013-04-24 ENCOUNTER — Inpatient Hospital Stay (HOSPITAL_COMMUNITY): Payer: Medicare Other

## 2013-04-24 VITALS — BP 121/61 | HR 86 | Ht 68.0 in

## 2013-04-24 DIAGNOSIS — I2589 Other forms of chronic ischemic heart disease: Secondary | ICD-10-CM

## 2013-04-24 DIAGNOSIS — M1990 Unspecified osteoarthritis, unspecified site: Secondary | ICD-10-CM | POA: Diagnosis present

## 2013-04-24 DIAGNOSIS — E7800 Pure hypercholesterolemia, unspecified: Secondary | ICD-10-CM | POA: Diagnosis present

## 2013-04-24 DIAGNOSIS — Z006 Encounter for examination for normal comparison and control in clinical research program: Secondary | ICD-10-CM

## 2013-04-24 DIAGNOSIS — R079 Chest pain, unspecified: Secondary | ICD-10-CM

## 2013-04-24 DIAGNOSIS — I739 Peripheral vascular disease, unspecified: Secondary | ICD-10-CM | POA: Diagnosis present

## 2013-04-24 DIAGNOSIS — R0989 Other specified symptoms and signs involving the circulatory and respiratory systems: Secondary | ICD-10-CM | POA: Diagnosis not present

## 2013-04-24 DIAGNOSIS — Z95810 Presence of automatic (implantable) cardiac defibrillator: Secondary | ICD-10-CM

## 2013-04-24 DIAGNOSIS — Z7982 Long term (current) use of aspirin: Secondary | ICD-10-CM | POA: Diagnosis not present

## 2013-04-24 DIAGNOSIS — K219 Gastro-esophageal reflux disease without esophagitis: Secondary | ICD-10-CM | POA: Diagnosis present

## 2013-04-24 DIAGNOSIS — E119 Type 2 diabetes mellitus without complications: Secondary | ICD-10-CM | POA: Diagnosis present

## 2013-04-24 DIAGNOSIS — I252 Old myocardial infarction: Secondary | ICD-10-CM

## 2013-04-24 DIAGNOSIS — Z794 Long term (current) use of insulin: Secondary | ICD-10-CM | POA: Diagnosis not present

## 2013-04-24 DIAGNOSIS — D649 Anemia, unspecified: Secondary | ICD-10-CM | POA: Diagnosis present

## 2013-04-24 DIAGNOSIS — I2582 Chronic total occlusion of coronary artery: Secondary | ICD-10-CM | POA: Diagnosis not present

## 2013-04-24 DIAGNOSIS — I6529 Occlusion and stenosis of unspecified carotid artery: Secondary | ICD-10-CM | POA: Diagnosis present

## 2013-04-24 DIAGNOSIS — I25810 Atherosclerosis of coronary artery bypass graft(s) without angina pectoris: Secondary | ICD-10-CM | POA: Diagnosis not present

## 2013-04-24 DIAGNOSIS — D509 Iron deficiency anemia, unspecified: Secondary | ICD-10-CM

## 2013-04-24 DIAGNOSIS — I2510 Atherosclerotic heart disease of native coronary artery without angina pectoris: Secondary | ICD-10-CM | POA: Diagnosis present

## 2013-04-24 DIAGNOSIS — R0609 Other forms of dyspnea: Secondary | ICD-10-CM | POA: Diagnosis not present

## 2013-04-24 DIAGNOSIS — I472 Ventricular tachycardia, unspecified: Secondary | ICD-10-CM | POA: Diagnosis present

## 2013-04-24 DIAGNOSIS — I5022 Chronic systolic (congestive) heart failure: Secondary | ICD-10-CM | POA: Diagnosis present

## 2013-04-24 DIAGNOSIS — I059 Rheumatic mitral valve disease, unspecified: Secondary | ICD-10-CM | POA: Diagnosis not present

## 2013-04-24 DIAGNOSIS — I255 Ischemic cardiomyopathy: Secondary | ICD-10-CM | POA: Diagnosis present

## 2013-04-24 DIAGNOSIS — I7300 Raynaud's syndrome without gangrene: Secondary | ICD-10-CM | POA: Diagnosis present

## 2013-04-24 DIAGNOSIS — I4729 Other ventricular tachycardia: Secondary | ICD-10-CM | POA: Diagnosis present

## 2013-04-24 DIAGNOSIS — I200 Unstable angina: Secondary | ICD-10-CM | POA: Diagnosis present

## 2013-04-24 DIAGNOSIS — I509 Heart failure, unspecified: Secondary | ICD-10-CM

## 2013-04-24 DIAGNOSIS — I5023 Acute on chronic systolic (congestive) heart failure: Secondary | ICD-10-CM | POA: Diagnosis present

## 2013-04-24 DIAGNOSIS — Z79899 Other long term (current) drug therapy: Secondary | ICD-10-CM | POA: Diagnosis not present

## 2013-04-24 HISTORY — DX: Gastro-esophageal reflux disease without esophagitis: K21.9

## 2013-04-24 HISTORY — DX: Unspecified osteoarthritis, unspecified site: M19.90

## 2013-04-24 LAB — COMPREHENSIVE METABOLIC PANEL
ALT: 16 U/L (ref 0–53)
AST: 19 U/L (ref 0–37)
Alkaline Phosphatase: 102 U/L (ref 39–117)
CO2: 26 mEq/L (ref 19–32)
Calcium: 8.7 mg/dL (ref 8.4–10.5)
GFR calc non Af Amer: >90 mL/min (ref >90)
Potassium: 4.2 mEq/L (ref 3.5–5.1)
Sodium: 139 mEq/L (ref 135–145)
Total Protein: 5.9 g/dL — ABNORMAL LOW (ref 6.0–8.3)

## 2013-04-24 LAB — ICD DEVICE OBSERVATION
HV IMPEDENCE: 54 Ohm
RV LEAD THRESHOLD: 0.6 V
TZON-0003FASTVT: 307.6 ms
VENTRICULAR PACING ICD: 1 pct

## 2013-04-24 LAB — CBC WITH DIFFERENTIAL/PLATELET
Basophils Absolute: 0 10*3/uL (ref 0.0–0.1)
Eosinophils Relative: 2 % (ref 0–5)
Lymphocytes Relative: 31 % (ref 12–46)
MCV: 87.2 fL (ref 78.0–100.0)
Neutrophils Relative %: 58 % (ref 43–77)
Platelets: 164 10*3/uL (ref 150–400)
RBC: 3.98 MIL/uL — ABNORMAL LOW (ref 4.22–5.81)
RDW: 13.9 % (ref 11.5–15.5)
WBC: 6.8 10*3/uL (ref 4.0–10.5)

## 2013-04-24 LAB — GLUCOSE, CAPILLARY
Glucose-Capillary: 253 mg/dL — ABNORMAL HIGH (ref 70–99)
Glucose-Capillary: 237 mg/dL — ABNORMAL HIGH (ref 70–99)

## 2013-04-24 LAB — TROPONIN I
Troponin I: <0.3 ng/mL (ref <0.30)
Troponin I: <0.3 ng/mL (ref <0.30)

## 2013-04-24 MED ORDER — ACETAMINOPHEN 325 MG PO TABS: 650.0000 mg | ORAL_TABLET | ORAL | Status: DC | PRN

## 2013-04-24 MED ORDER — MAGNESIUM SULFATE 40 MG/ML IJ SOLN
2.0000 g | Freq: Once | INTRAMUSCULAR | Status: AC
  Administered 2013-04-24: 2 g via INTRAVENOUS
  Filled 2013-04-24: qty 50

## 2013-04-24 MED ORDER — ALUM & MAG HYDROXIDE-SIMETH 200-200-20 MG/5ML PO SUSP
15.0000 mL | Freq: Four times a day (QID) | ORAL | Status: DC | PRN
  Administered 2013-04-24: 15 mL via ORAL

## 2013-04-24 MED ORDER — HYDROCODONE-ACETAMINOPHEN 5-325 MG PO TABS
1.0000 | ORAL_TABLET | Freq: Four times a day (QID) | ORAL | Status: DC | PRN
  Administered 2013-04-24 – 2013-04-29 (×11): 1 via ORAL
  Filled 2013-04-24 (×13): qty 1

## 2013-04-24 MED ORDER — INSULIN ASPART PROT & ASPART (70-30 MIX) 100 UNIT/ML ~~LOC~~ SUSP
20.0000 [IU] | Freq: Every day | SUBCUTANEOUS | Status: DC
  Filled 2013-04-24: qty 10

## 2013-04-24 MED ORDER — SODIUM CHLORIDE 0.9 % IJ SOLN
3.0000 mL | Freq: Two times a day (BID) | INTRAMUSCULAR | Status: DC
  Administered 2013-04-24 – 2013-04-25 (×3): 3 mL via INTRAVENOUS

## 2013-04-24 MED ORDER — SIMVASTATIN 20 MG PO TABS
20.0000 mg | ORAL_TABLET | Freq: Every day | ORAL | Status: DC
  Administered 2013-04-25 – 2013-04-28 (×4): 20 mg via ORAL
  Filled 2013-04-24 (×5): qty 1

## 2013-04-24 MED ORDER — ALUM & MAG HYDROXIDE-SIMETH 200-200-20 MG/5ML PO SUSP
30.0000 mL | Freq: Four times a day (QID) | ORAL | Status: DC | PRN
  Filled 2013-04-24 (×2): qty 30

## 2013-04-24 MED ORDER — INSULIN ASPART PROT & ASPART (70-30 MIX) 100 UNIT/ML ~~LOC~~ SUSP
30.0000 [IU] | Freq: Every day | SUBCUTANEOUS | Status: DC
  Administered 2013-04-25 – 2013-04-29 (×4): 30 [IU] via SUBCUTANEOUS
  Filled 2013-04-24: qty 10

## 2013-04-24 MED ORDER — SODIUM CHLORIDE 0.9 % IV SOLN: 250.0000 mL | INTRAVENOUS | Status: DC | PRN

## 2013-04-24 MED ORDER — ASPIRIN EC 81 MG PO TBEC
81.0000 mg | DELAYED_RELEASE_TABLET | Freq: Every day | ORAL | Status: DC
  Administered 2013-04-24 – 2013-04-29 (×6): 81 mg via ORAL
  Filled 2013-04-24 (×6): qty 1

## 2013-04-24 MED ORDER — SODIUM CHLORIDE 0.9 % IJ SOLN: 3.0000 mL | INTRAMUSCULAR | Status: DC | PRN

## 2013-04-24 MED ORDER — FUROSEMIDE 10 MG/ML IJ SOLN
40.0000 mg | Freq: Two times a day (BID) | INTRAMUSCULAR | Status: DC
  Administered 2013-04-24 – 2013-04-29 (×9): 40 mg via INTRAVENOUS
  Filled 2013-04-24 (×13): qty 4

## 2013-04-24 MED ORDER — INSULIN ASPART PROT & ASPART (70-30 MIX) 100 UNIT/ML ~~LOC~~ SUSP
20.0000 [IU] | Freq: Every day | SUBCUTANEOUS | Status: DC
  Administered 2013-04-24 – 2013-04-28 (×5): 20 [IU] via SUBCUTANEOUS
  Filled 2013-04-24: qty 10

## 2013-04-24 MED ORDER — INSULIN ASPART 100 UNIT/ML ~~LOC~~ SOLN
0.0000 [IU] | Freq: Three times a day (TID) | SUBCUTANEOUS | Status: DC
Start: 2013-04-25 — End: 2013-04-29
  Administered 2013-04-25: 3 [IU] via SUBCUTANEOUS
  Administered 2013-04-25 (×2): 2 [IU] via SUBCUTANEOUS
  Administered 2013-04-26: 3 [IU] via SUBCUTANEOUS
  Administered 2013-04-26: 17:00:00 8 [IU] via SUBCUTANEOUS
  Administered 2013-04-27: 08:00:00 2 [IU] via SUBCUTANEOUS
  Administered 2013-04-27: 11 [IU] via SUBCUTANEOUS
  Administered 2013-04-27: 3 [IU] via SUBCUTANEOUS
  Administered 2013-04-28: 5 [IU] via SUBCUTANEOUS
  Administered 2013-04-28: 3 [IU] via SUBCUTANEOUS
  Administered 2013-04-29: 2 [IU] via SUBCUTANEOUS
  Administered 2013-04-29: 3 [IU] via SUBCUTANEOUS

## 2013-04-24 MED ORDER — PREGABALIN 75 MG PO CAPS
150.0000 mg | ORAL_CAPSULE | Freq: Every day | ORAL | Status: DC
  Administered 2013-04-25 – 2013-04-29 (×5): 150 mg via ORAL
  Filled 2013-04-24 (×3): qty 2
  Filled 2013-04-24: qty 1
  Filled 2013-04-24: qty 6
  Filled 2013-04-24: qty 1

## 2013-04-24 MED ORDER — LISINOPRIL 2.5 MG PO TABS
2.5000 mg | ORAL_TABLET | Freq: Every day | ORAL | Status: DC
  Administered 2013-04-24 – 2013-04-29 (×6): 2.5 mg via ORAL
  Filled 2013-04-24 (×6): qty 1

## 2013-04-24 NOTE — Assessment & Plan Note (Signed)
No intercurrent ventricular tachycardia 

## 2013-04-24 NOTE — Progress Notes (Signed)
Pt with Magnesium level of 1.4 at 17:49 this PM. According to chart no magnesium replacement has been given. Pt also complaining of indigestion and pain in abdomen so 1 Norco given and Maalox ordered per cardiology PRN orders. MD, Balfour notified and ordered IV Mg. Will continue to monitor pt. Baron Hamper, RN 04/24/2013

## 2013-04-24 NOTE — H&P (Signed)
CARDIOLOGY ADMISSION HISTORY & PHYSICAL  Patient ID: MAZE CORNIEL MRN: [id number], DOB/AGE: October 22, 1936   Admit date: 04/24/2013  Primary Physician: Burnell Blanks, MD  Primary Cardiologist: Lewayne Bunting, MD Reason for Admission: Acute on chronic systolic HF  History of Present Illness Mr. Stemmer is a 77 year old man with an ischemic CM, chronic systolic HF, CAD s/p CABG 1990 and sustained VT and syncope s/p ICD implant who presented to our office today to see Dr. Graciela Husbands as an add-on for worsening HF symptoms and chest pain. Over the last 2-3 weeks he has experienced progressive exercise intolerance, orthopnea, nocturnal dyspnea and exertional chest discomfort which is relieved by rest.  Of note, Mr. Sharpley has h/o sustained VT with syncope and easily inducible VT by EPS. He underwent initial ICD insertion in 1990 with epicardial patches placed at that time. His initial system utilized two epicardial-0040 patches, serial numbers [id number] and serial numbers [id number]. His epicardial pacing leads were epicardial screws model 4312, serial number [id number] and [id number]. He underwent ICD generator change in 2004. His ICD battery was then found to be at ERI prematurely and in 2005 he underwent removal of a previously implanted ICD with capping of the old epicardial leads and patches, followed by insertion of a new transvenous single-chamber defibrillator by way of the left subclavian vein. 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  Past Medical History Past Medical History  Diagnosis Date  . Osteoarthrosis, unspecified whether generalized or localized, unspecified site   . Type II or unspecified type diabetes mellitus without mention of complication, not stated as uncontrolled   . Arthropathy, unspecified, site unspecified   . Occlusion and stenosis of carotid artery without mention of cerebral infarction   . Raynaud's syndrome   . Congestive heart failure, unspecified   . Backache,  unspecified   . Automatic implantable cardiac defibrillator in situ   . Paroxysmal ventricular tachycardia   . Other specified forms of chronic ischemic heart disease   . Degenerative joint disease   . DM (diabetes mellitus)   . Peripheral vascular disease   . Cardiomyopathy, ischemic     Past Surgical History (please see cardiac device history outlined in HPI) Past Surgical History  Procedure Laterality Date  . Anal fistulotomy      Allergies/Intolerances No Known Allergies  Current Home Medications      ASPIR-81 81 MG EC tablet  Generic drug:  aspirin  Take 81 mg by mouth daily.     cilostazol 100 MG tablet  Commonly known as:  PLETAL  Take 100 mg by mouth 2 (two) times daily.     etodolac 400 MG tablet  Commonly known as:  LODINE  Take 400 mg by mouth daily.     furosemide 20 MG tablet  Commonly known as:  LASIX  Take 20 mg by mouth daily.     HUMULIN 70/30 (70-30) 100 UNIT/ML injection  Generic drug:  insulin NPH-regular  Inject 30 Units into the skin 2 (two) times daily with a meal. 30units in the am and 20units in the pm     HYDROcodone-acetaminophen 10-650 MG per tablet  Commonly known as:  LORCET  Take 1 tablet by mouth as needed. For pain     LYRICA 75 MG capsule  Generic drug:  pregabalin  Take 150 mg by mouth daily.     metFORMIN 1000 MG tablet  Commonly known as:  GLUCOPHAGE  Take 1,000 mg by mouth 2 (two) times daily with a  meal.     potassium chloride 20 MEQ packet  Commonly known as:  KLOR-CON  Take 20 mEq by mouth every other day.     pravastatin 40 MG tablet  Commonly known as:  PRAVACHOL  Take 40 mg by mouth daily.     SKELAXIN 800 MG tablet  Generic drug:  metaxalone  Take 800 mg by mouth 2 (two) times daily.     vitamin B-12 1000 MCG tablet  Commonly known as:  CYANOCOBALAMIN  Take 1,000 mcg by mouth daily.     Family History Family History  Problem Relation Age of Onset  . Heart failure Mother 42  . Heart attack Father   .  Leukemia Sister   . Stomach cancer Maternal Grandfather     Social History Social History  . Marital Status: Married   Social History Main Topics  . Smoking status: Never Smoker   . Smokeless tobacco: No  . Alcohol Use: Yes     Comment: rare  . 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     Review of Systems General: No chills, fever, night sweats or weight changes  Cardiovascular:  +CP +DOE +orthopnea +PND  No edema, palpitations Dermatological: No rash, lesions or masses Respiratory: No cough Urologic: No hematuria, dysuria Abdominal: No nausea, vomiting, diarrhea, bright red blood per rectum, melena, or hematemesis Neurologic: No visual changes, weakness, changes in mental status All other systems reviewed and are otherwise negative except as noted above.  Physical Exam Vitals: BP 121/61  Pulse 86  Ht 5\' 8"  (1.727 m) General: Well developed 77 year old male in no acute distress. HEENT: Normocephalic, atraumatic. EOMs intact. Sclera nonicteric. Oropharynx clear.  Neck: Supple without bruits. JVP 9-10. Lungs: Respirations regular and unlabored, CTA bilaterally. No wheezes, rales or rhonchi. Heart: RRR. S1, S2 present. No murmurs, rub, S3 or S4. Abdomen: Soft, non-tender, non-distended. BS present x 4 quadrants. No hepatosplenomegaly.  Extremities: No clubbing or cyanosis. 2+ edema bilaterally.  Psych: Normal affect. Neuro: Alert and oriented X 3. Moves all extremities spontaneously. Musculoskeletal: No kyphosis. Skin: Intact. Warm and dry. No rashes or petechiae in exposed areas.   Labs Admission labs pending  Radiology/Studies No results found.  12-lead ECG from office - sinus rhythm at 86 intervals 25/11/37, occasional PVCs, nonspecific ST-T abnormalities   Assessment and Plan 1. Chest pain 2. Acute on  chronic systolic HF 3. Ischemic CM 4. Sustained VT with syncope and +EPS s/p ICD implant (initially in 1990, then 2004, 2005 and 2012) 5. CAD s/p CABG 1990  Mr. Humbarger presents with CP that is worrisome for angina and acute on chronic systolic HF. He has been evaluated by Dr. Graciela Husbands. He will be admitted to telemetry with routine labs obtained. Will order serial CEs to rule out MI and update his echocardiogram. Will diurese with IV Lasix with close watch over his potassium and renal function. Will also need definitive evaluation of his coronary anatomy with cardiac cath on Friday. Further recommendations pending the results of the above-mentioned studies and his clinical course.   Signed, EDMISTEN, Nehemiah Settle, PA-C 04/24/2013, 3:46 PM  AS ABOVE  SEEN AND ADMITTED FROM OFFICE

## 2013-04-24 NOTE — Assessment & Plan Note (Signed)
As above.

## 2013-04-24 NOTE — Assessment & Plan Note (Signed)
The patient has biventricular failure in the context of LV function is not yet known. Given his significant symptoms a company chest discomfort with exertion we will plan to admit him to hospital for IV diuresis and evaluation of primary intravenous causes. This would include catheterization this 77 year old grafts.

## 2013-04-24 NOTE — Progress Notes (Signed)
Pt requesting normal nighttime medicine. Pharm tech notified to go over medicine with pt. MD, Terressa Koyanagi notified for orders. Baron Hamper, RN 04/24/2013 9:47 PM

## 2013-04-24 NOTE — Progress Notes (Signed)
Patient Care Team: Ailene Ravel as PCP - General   HPI  Victor [name] is a 77 y.o. male Seen because of worsening symptoms of congestive heart failure as an add-on today.  His history of ischemic heart disease and ventricular tachycardia.  I have reviewed the medical record. We have no recent data. His last catheterization was prior to bypass surgery in 1990. He says that there is beeninterval Myoview about 5 years ago  Over the last 2-3 weeks he has noted progressive exercise intolerance orthopnea nocturnal dyspnea and chest discomfort which is relieved by rest.  He is quite pale but is bisected his affect is all-time  Past Medical History  Diagnosis Date  . Osteoarthrosis, unspecified whether generalized or localized, unspecified site   . Type II or unspecified type diabetes mellitus without mention of complication, not stated as uncontrolled   . Arthropathy, unspecified, site unspecified   . Occlusion and stenosis of carotid artery without mention of cerebral infarction   . Raynaud's syndrome   . Congestive heart failure, unspecified   . Backache, unspecified   . Automatic implantable cardiac defibrillator in situ   . Paroxysmal ventricular tachycardia   . Other specified forms of chronic ischemic heart disease   . Degenerative joint disease   . DM (diabetes mellitus)   . Peripheral vascular disease   . Cardiomyopathy, ischemic     Past Surgical History  Procedure Laterality Date  . Anal fistulotomy      Current Outpatient Prescriptions  Medication Sig Dispense Refill  . aspirin (ASPIR-81) 81 MG EC tablet Take 81 mg by mouth daily.        . cilostazol (PLETAL) 100 MG tablet Take 100 mg by mouth 2 (two) times daily.      Marland Kitchen etodolac (LODINE) 400 MG tablet Take 400 mg by mouth daily.       . furosemide (LASIX) 20 MG tablet Take 20 mg by mouth daily.        Marland Kitchen HYDROcodone-acetaminophen (LORCET) 10-650 MG per tablet Take 1 tablet by mouth as needed. For pain       .  insulin NPH-insulin regular (HUMULIN 70/30) (70-30) 100 UNIT/ML injection Inject 30 Units into the skin 2 (two) times daily with a meal. 30units in the am and 20units in the pm      . metaxalone (SKELAXIN) 800 MG tablet Take 800 mg by mouth 2 (two) times daily.        . metFORMIN (GLUCOPHAGE) 1000 MG tablet Take 1,000 mg by mouth 2 (two) times daily with a meal.      . potassium chloride (KLOR-CON) 20 MEQ packet Take 20 mEq by mouth every other day.        . pravastatin (PRAVACHOL) 40 MG tablet Take 40 mg by mouth daily.        . pregabalin (LYRICA) 75 MG capsule Take 150 mg by mouth daily.       . vitamin B-12 (CYANOCOBALAMIN) 1000 MCG tablet Take 1,000 mcg by mouth daily.       No current facility-administered medications for this visit.    No Known Allergies   family history positive for coronary disease Social history married not smoking Review of Systems negative except from HPI and PMH  Physical Exam BP 121/61  Pulse 86  Ht 5\' 8"  (1.727 m) Well developed and well nourished in no acute distress HENT normal E scleral and icterus clear Neck Supple JVP9-10; carotids brisk and full Clear to ausculation  Regular rate and rhythm, no murmurs gallops or rub Soft with active bowel sounds No clubbing cyanosis 2+ Edema Alert and oriented, grossly normal motor and sensory function Skin Warm and Dry  ECG demonstrates sinus rhythm at 86 intervals 25/11/37 Occasional PVCs occasional nonspecific ST-T changes  Assessment and  Plan

## 2013-04-25 ENCOUNTER — Ambulatory Visit (HOSPITAL_COMMUNITY): Admission: RE | Admit: 2013-04-25 | Payer: Medicare Other | Source: Ambulatory Visit | Admitting: Cardiovascular Disease

## 2013-04-25 DIAGNOSIS — D509 Iron deficiency anemia, unspecified: Secondary | ICD-10-CM

## 2013-04-25 DIAGNOSIS — I2589 Other forms of chronic ischemic heart disease: Secondary | ICD-10-CM

## 2013-04-25 DIAGNOSIS — I059 Rheumatic mitral valve disease, unspecified: Secondary | ICD-10-CM

## 2013-04-25 DIAGNOSIS — I509 Heart failure, unspecified: Secondary | ICD-10-CM

## 2013-04-25 LAB — BASIC METABOLIC PANEL
Calcium: 8.8 mg/dL (ref 8.4–10.5)
Creatinine, Ser: 0.68 mg/dL (ref 0.50–1.35)
GFR calc Af Amer: >90 mL/min (ref >90)
GFR calc non Af Amer: >90 mL/min (ref >90)

## 2013-04-25 LAB — GLUCOSE, CAPILLARY: Glucose-Capillary: 188 mg/dL — ABNORMAL HIGH (ref 70–99)

## 2013-04-25 LAB — PROTIME-INR: Prothrombin Time: 15.3 seconds — ABNORMAL HIGH (ref 11.6–15.2)

## 2013-04-25 MED ORDER — SODIUM CHLORIDE 0.9 % IV SOLN: 1.0000 mL/kg/h | INTRAVENOUS | Status: DC

## 2013-04-25 MED ORDER — DIAZEPAM 5 MG PO TABS
5.0000 mg | ORAL_TABLET | ORAL | Status: AC
  Administered 2013-04-26: 5 mg via ORAL

## 2013-04-25 MED ORDER — DIAZEPAM 5 MG PO TABS
5.0000 mg | ORAL_TABLET | ORAL | Status: DC
  Filled 2013-04-25: qty 1

## 2013-04-25 NOTE — Progress Notes (Signed)
04/25/13 1140 In to complete Heart Failure home health screen.  Pt. does not want home health at this time.  He states his spouse helps him to obtain his medications, and takes care of him.  NCM will follow for further dc needs. Tera Mater, RN, BSN NCM [phone]

## 2013-04-25 NOTE — Progress Notes (Signed)
Patient Name: Victor [name]      SUBJECTIVE breathing about the same Good diuresis Also complains of night sweats X 1 yr assoc with 20 lb unitntinal wieght loss  Past Medical History  Diagnosis Date  . Arthropathy, unspecified, site unspecified   . Occlusion and stenosis of carotid artery without mention of cerebral infarction   . Raynaud's syndrome   . Congestive heart failure, unspecified   . Paroxysmal ventricular tachycardia   . Other specified forms of chronic ischemic heart disease   . Peripheral vascular disease   . Cardiomyopathy, ischemic   . Automatic implantable cardioverter-defibrillator in situ   . High cholesterol   . Myocardial infarction 1990    "4-5 while in [facility] til they put in ICD" (04/24/2013)  . Shortness of breath     "both exertion and lying down" (04/24/2013)  . Type II or unspecified type diabetes mellitus without mention of complication, not stated as uncontrolled   . GERD (gastroesophageal reflux disease)   . Osteoarthrosis, unspecified whether generalized or localized, unspecified site   . Degenerative joint disease   . Arthritis     "back and hands" (04/24/2013)  . Chronic lower back pain     PHYSICAL EXAM Filed Vitals:   04/24/13 2127 04/24/13 2241 04/25/13 0242 04/25/13 0525  BP: 144/73  128/61 130/81  Pulse: 61  84 81  Temp: 97.9 F (36.6 C)   97.4 F (36.3 C)  TempSrc: Oral   Oral  Resp: 18   20  Height:      Weight:    170 lb 10.2 oz (77.4 kg)  SpO2: 94% 94% 100% 93%    Well developed and nourished in no acute distress HENT normal Neck supple with JVP->10 Carotids brisk and full without bruits Clear Regular rate and rhythm, no murmurs or gallops Abd-soft with active BS without hepatomegaly No Clubbing cyanosis 2+ edema Skin-warm and dry A & Oriented  Grossly normal sensory and motor function  TELEMETRY: Reviewed telemetry pt in sinus although pulse is quite irregular    Intake/Output Summary (Last 24 hours) at  04/25/13 0818 Last data filed at 04/25/13 0655  Gross per 24 hour  Intake    600 ml  Output   2800 ml  Net  -2200 ml    LABS: Basic Metabolic Panel:  Recent Labs Lab 04/24/13 1749  NA 139  K 4.2  CL 105  CO2 26  GLUCOSE 239*  BUN 13  CREATININE 0.69  CALCIUM 8.7  MG 1.4*   Cardiac Enzymes:  Recent Labs  04/24/13 1749 04/24/13 2259  TROPONINI <0.30 <0.30   CBC:  Recent Labs Lab 04/24/13 1749  WBC 6.8  NEUTROABS 3.9  HGB 11.7*  HCT 34.7*  MCV 87.2  PLT 164   PROTIME: No results found for this basename: LABPROT, INR,  in the last 72 hours Liver Function Tests:  Recent Labs  04/24/13 1749  AST 19  ALT 16  ALKPHOS 102  BILITOT 0.3  PROT 5.9*  ALBUMIN 3.3*   No results found for this basename: LIPASE, AMYLASE,  in the last 72 hours BNP: BNP (last 3 results)  Recent Labs  04/24/13 1749  PROBNP 1857.0*   CXR >>pulm vasc cpngestion  ASSESSMENT AND PLAN:  Principal Problem:   Acute on chronic heart failure Active Problems:   CARDIOMYOPATHY, ISCHEMIC   ICD-[employer]   Anemia-chronic\  Continue IV diuresis and plan [facility] in am  Signed, Sherryl Manges MD  04/25/2013

## 2013-04-25 NOTE — Progress Notes (Signed)
  Echocardiogram 2D Echocardiogram has been performed.  Cathie Beams 04/25/2013, 3:44 PM

## 2013-04-25 NOTE — Progress Notes (Signed)
Brief Nutrition Note:   RD pulled to chart for unintentional weight loss.   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 Pt reports increased physical activity and eating based on hunger cues.  No loss of appetite.   Wt Readings from Last 5 Encounters:  04/25/13 170 lb 10.2 oz (77.4 kg)  01/22/13 183 lb 3.2 oz (83.099 kg)  07/19/12 179 lb (81.194 kg)  01/19/12 182 lb 12.8 oz (82.918 kg)  10/05/11 180 lb (81.647 kg)  Body mass index is 25.95 kg/(m^2). overweight  No nutrition interventions warranted at this time. Please consult as needed.   Clarene Duke RD, LDN Pager [phone] After Hours pager [phone]

## 2013-04-25 NOTE — Progress Notes (Signed)
Pt states he was lying down and could not catch his breath. Pt assessed and did not look in distress. O2 Sat 100% on room air, BP 128/61, HR 84, RR 18. Lung with crackles in all lobes (no change from previous assessment). Pt HOB elevated Pt states he will try to sleep with HOB elevated. Will continue to monitor pt. Baron Hamper, RN 04/25/2013 2:53 AM

## 2013-04-25 NOTE — Progress Notes (Signed)
Utilization Review Completed.   Perris Tripathi, RN, BSN Nurse Case Manager  [phone]  

## 2013-04-26 ENCOUNTER — Other Ambulatory Visit: Payer: Self-pay

## 2013-04-26 ENCOUNTER — Encounter (HOSPITAL_COMMUNITY)
Admission: AD | Disposition: A | Discharge status: Home / Self Care | Payer: Self-pay | Source: Ambulatory Visit | Attending: Internal Medicine

## 2013-04-26 DIAGNOSIS — I25810 Atherosclerosis of coronary artery bypass graft(s) without angina pectoris: Secondary | ICD-10-CM

## 2013-04-26 DIAGNOSIS — I472 Ventricular tachycardia: Secondary | ICD-10-CM

## 2013-04-26 HISTORY — PX: PERCUTANEOUS STENT INTERVENTION: SHX5500

## 2013-04-26 HISTORY — PX: LEFT AND RIGHT HEART CATHETERIZATION WITH CORONARY/GRAFT ANGIOGRAM: SHX5448

## 2013-04-26 LAB — POCT I-STAT 3, VENOUS BLOOD GAS (G3P V)
Acid-Base Excess: 3 mmol/L — ABNORMAL HIGH (ref 0.0–2.0)
Acid-Base Excess: 2 mmol/L (ref 0.0–2.0)
Bicarbonate: 29.2 mEq/L — ABNORMAL HIGH (ref 20.0–24.0)
Bicarbonate: 28.3 mEq/L — ABNORMAL HIGH (ref 20.0–24.0)
O2 Saturation: 63 %
TCO2: 31 mmol/L (ref 0–100)
pH, Ven: 7.367 — ABNORMAL HIGH (ref 7.250–7.300)
pO2, Ven: 34 mmHg (ref 30.0–45.0)

## 2013-04-26 LAB — BASIC METABOLIC PANEL
BUN: 21 mg/dL (ref 6–23)
Calcium: 9.7 mg/dL (ref 8.4–10.5)
Chloride: 99 mEq/L (ref 96–112)
GFR calc Af Amer: >90 mL/min (ref >90)
GFR calc Af Amer: >90 mL/min (ref >90)
GFR calc non Af Amer: 85 mL/min — ABNORMAL LOW (ref >90)
GFR calc non Af Amer: 87 mL/min — ABNORMAL LOW (ref >90)
Potassium: 3.9 mEq/L (ref 3.5–5.1)
Potassium: 4 mEq/L (ref 3.5–5.1)
Sodium: 138 mEq/L (ref 135–145)
Sodium: 135 mEq/L (ref 135–145)

## 2013-04-26 LAB — POCT I-STAT 3, ART BLOOD GAS (G3+)
Bicarbonate: 27.2 mEq/L — ABNORMAL HIGH (ref 20.0–24.0)
O2 Saturation: 97 %
TCO2: 28 mmol/L (ref 0–100)
pCO2 arterial: 39.3 mmHg (ref 35.0–45.0)
pH, Arterial: 7.448 (ref 7.350–7.450)
pO2, Arterial: 88 mmHg (ref 80.0–100.0)

## 2013-04-26 LAB — GLUCOSE, CAPILLARY
Glucose-Capillary: 174 mg/dL — ABNORMAL HIGH (ref 70–99)
Glucose-Capillary: 104 mg/dL — ABNORMAL HIGH (ref 70–99)
Glucose-Capillary: 291 mg/dL — ABNORMAL HIGH (ref 70–99)
Glucose-Capillary: 182 mg/dL — ABNORMAL HIGH (ref 70–99)

## 2013-04-26 LAB — MAGNESIUM: Magnesium: 1.9 mg/dL (ref 1.5–2.5)

## 2013-04-26 SURGERY — LEFT AND RIGHT HEART CATHETERIZATION WITH CORONARY/GRAFT ANGIOGRAM
Anesthesia: LOCAL

## 2013-04-26 MED ORDER — FENTANYL CITRATE 0.05 MG/ML IJ SOLN
INTRAMUSCULAR | Status: AC
  Filled 2013-04-26: qty 2

## 2013-04-26 MED ORDER — MORPHINE SULFATE 2 MG/ML IJ SOLN: 2.0000 mg | INTRAMUSCULAR | Status: DC | PRN

## 2013-04-26 MED ORDER — DIAZEPAM 2 MG PO TABS
2.0000 mg | ORAL_TABLET | ORAL | Status: DC | PRN
  Administered 2013-04-26 (×2): 2 mg via ORAL
  Filled 2013-04-26 (×2): qty 1

## 2013-04-26 MED ORDER — TICAGRELOR 90 MG PO TABS
ORAL_TABLET | ORAL | Status: AC
  Filled 2013-04-26: qty 2

## 2013-04-26 MED ORDER — SODIUM CHLORIDE 0.9 % IJ SOLN: 3.0000 mL | INTRAMUSCULAR | Status: DC | PRN

## 2013-04-26 MED ORDER — VERAPAMIL HCL 2.5 MG/ML IV SOLN
INTRAVENOUS | Status: AC
  Filled 2013-04-26: qty 2

## 2013-04-26 MED ORDER — SODIUM CHLORIDE 0.9 % IV SOLN: 1.0000 mL/kg/h | INTRAVENOUS | Status: DC

## 2013-04-26 MED ORDER — MAGNESIUM SULFATE 40 MG/ML IJ SOLN
2.0000 g | Freq: Once | INTRAMUSCULAR | Status: AC
  Administered 2013-04-26: 20:00:00 2 g via INTRAVENOUS
  Filled 2013-04-26: qty 50

## 2013-04-26 MED ORDER — LIDOCAINE HCL (PF) 1 % IJ SOLN
INTRAMUSCULAR | Status: AC
  Filled 2013-04-26: qty 30

## 2013-04-26 MED ORDER — CLOPIDOGREL BISULFATE 75 MG PO TABS
75.0000 mg | ORAL_TABLET | Freq: Every day | ORAL | Status: DC
  Administered 2013-04-27 – 2013-04-29 (×3): 75 mg via ORAL
  Filled 2013-04-26 (×4): qty 1
  Filled 2013-04-26: qty 2

## 2013-04-26 MED ORDER — CLOPIDOGREL BISULFATE 75 MG PO TABS
300.0000 mg | ORAL_TABLET | Freq: Once | ORAL | Status: AC
  Administered 2013-04-26: 20:00:00 300 mg via ORAL
  Filled 2013-04-26: qty 2

## 2013-04-26 MED ORDER — SODIUM CHLORIDE 0.9 % IJ SOLN: 3.0000 mL | Freq: Two times a day (BID) | INTRAMUSCULAR | Status: DC

## 2013-04-26 MED ORDER — HEPARIN (PORCINE) IN NACL 2-0.9 UNIT/ML-% IJ SOLN
INTRAMUSCULAR | Status: AC
  Filled 2013-04-26: qty 1000

## 2013-04-26 MED ORDER — SODIUM CHLORIDE 0.9 % IV SOLN: 250.0000 mL | INTRAVENOUS | Status: DC | PRN

## 2013-04-26 MED ORDER — MIDAZOLAM HCL 5 MG/5ML IJ SOLN
INTRAMUSCULAR | Status: AC
  Filled 2013-04-26: qty 5

## 2013-04-26 MED ORDER — STUDY - INVESTIGATIONAL DRUG SIMPLE RECORD
0.7500 mg/kg | Freq: Once | Status: DC
  Filled 2013-04-26: qty 56.63

## 2013-04-26 MED ORDER — SODIUM CHLORIDE 0.9 % IV SOLN: INTRAVENOUS | Status: DC

## 2013-04-26 MED ORDER — BIVALIRUDIN 250 MG IV SOLR
INTRAVENOUS | Status: AC
  Filled 2013-04-26: qty 250

## 2013-04-26 MED ORDER — ONDANSETRON HCL 4 MG/2ML IJ SOLN: 4.0000 mg | Freq: Four times a day (QID) | INTRAMUSCULAR | Status: DC | PRN

## 2013-04-26 MED ORDER — FUROSEMIDE 10 MG/ML IJ SOLN
40.0000 mg | Freq: Once | INTRAMUSCULAR | Status: AC
  Administered 2013-04-26: 16:00:00 40 mg via INTRAVENOUS
  Filled 2013-04-26: qty 4

## 2013-04-26 MED ORDER — TICAGRELOR 90 MG PO TABS
90.0000 mg | ORAL_TABLET | Freq: Two times a day (BID) | ORAL | Status: DC
Start: 2013-04-26 — End: 2013-04-26
  Filled 2013-04-26: qty 1

## 2013-04-26 MED ORDER — STUDY - INVESTIGATIONAL DRUG SIMPLE RECORD
1.7500 mg/kg/h | Freq: Once | Status: DC
  Filled 2013-04-26: qty 250

## 2013-04-26 MED ORDER — OXYCODONE-ACETAMINOPHEN 5-325 MG PO TABS
1.0000 | ORAL_TABLET | ORAL | Status: DC | PRN
  Administered 2013-04-27 (×2): 1 via ORAL
  Administered 2013-04-28 – 2013-04-29 (×3): 2 via ORAL
  Filled 2013-04-26: qty 2
  Filled 2013-04-26: qty 1
  Filled 2013-04-26 (×2): qty 2
  Filled 2013-04-26: qty 1

## 2013-04-26 NOTE — Progress Notes (Signed)
Patient ID: Javonni C Mealey, male   DOB: 07/13/1936, 77 y.o.   MRN: [id number] Subjective:  Dyspnea improved, denies chest pain  Objective:  Vital Signs in the last 24 hours: Temp:  [97.5 F (36.4 C)-97.8 F (36.6 C)] 97.8 F (36.6 C) (06/06 0509) Pulse Rate:  [71-80] 71 (06/06 0509) Resp:  [18-20] 18 (06/06 0509) BP: (112-162)/(58-74) 162/61 mmHg (06/06 0509) SpO2:  [93 %-100 %] 93 % (06/06 0509) Weight:  [166 lb 8 oz (75.524 kg)] 166 lb 8 oz (75.524 kg) (06/06 0509)  Intake/Output from previous day: 06/05 0701 - 06/06 0700 In: 1060 [P.O.:1060] Out: 2375 [Urine:2375] Intake/Output from this shift: Total I/O In: 0  Out: 150 [Urine:150]  Physical Exam: diskempt appearing NAD HEENT: Unremarkable Neck:  8 cm JVD, no thyromegally Back:  No CVA tenderness Lungs:  Clear except for basilar rales HEART:  Regular rate rhythm, no murmurs, no rubs, no clicks Abd:  soft, positive bowel sounds, no organomegally, no rebound, no guarding Ext:  2 plus pulses, no edema, no cyanosis, no clubbing Skin:  No rashes no nodules Neuro:  CN II through XII intact, motor grossly intact  Lab Results:  Recent Labs  04/24/13 1749  WBC 6.8  HGB 11.7*  PLT 164    Recent Labs  04/25/13 0830 04/26/13 0440  NA 139 138  K 3.9 3.9  CL 101 97  CO2 31 32  GLUCOSE 241* 190*  BUN 15 19  CREATININE 0.68 0.81    Recent Labs  04/24/13 2259 04/25/13 0830  TROPONINI <0.30 <0.30   Hepatic Function Panel  Recent Labs  04/24/13 1749  PROT 5.9*  ALBUMIN 3.3*  AST 19  ALT 16  ALKPHOS 102  BILITOT 0.3   No results found for this basename: CHOL,  in the last 72 hours No results found for this basename: PROTIME,  in the last 72 hours  Imaging: Dg Chest 2 View  04/24/2013   *RADIOLOGY REPORT*  Clinical Data: Short of breath.  CHEST - 2 VIEW  Comparison: 08/22/2012.  Findings: Cardiomegaly.  Left subclavian AICD.  Epicardial pacing apparatus appears similar to prior.  Compared to prior, there  is mild pulmonary vascular congestion.  Aortic arch atherosclerosis. No airspace disease.  No pleural effusion.  Cardiopericardial silhouette is mildly enlarged for projection. CABG.  IMPRESSION: Mild cardiomegaly and pulmonary vascular congestion.   Original Report Authenticated By: Geoffrey Lamke, M.D.    Cardiac Studies: Tele - nsr Assessment/Plan:  1. Acute on chronic systolic CHF 2. VT - quiet 3. ICM, s/p CABG Rec: note plans for heart catheterization. Continue IV diuresis, directed by results of heart cath.  LOS: 2 days    Gregg Taylor,M.D. 04/26/2013, 8:14 AM    

## 2013-04-26 NOTE — Research (Signed)
REGULATE-PCI Research Study Informed Consent   Subject Name: Victor [name]  Subject met inclusion and exclusion criteria.  The informed consent form, study requirements and expectations were reviewed with the subject and questions and concerns were addressed prior to the signing of the consent form.  The subject verbalized understanding of the trail requirements.  The subject agreed to participate in the [facility] trial and signed the informed consent.  The informed consent was obtained prior to performance of any protocol-specific procedures for the subject.  A copy of the signed informed consent was given to the subject and a copy was placed in the subject's medical record.  The subject signed the ICF on 04/26/13 at 1030.  Claire Shown 04/26/2013, 2:05 PM

## 2013-04-26 NOTE — CV Procedure (Addendum)
Cardiac Cath Procedure Note  Indication: [place] and acute systolic HF  Procedures performed:  1) Right heart cathererization 2) Selective coronary angiography 3) SVG angio x 2 4) Left heart catheterization 5) Left ventriculogram  Description of procedure:     The risks and indication of the procedure were explained. Consent was signed and placed on the chart. An appropriate timeout was taken prior to the procedure. The right groin was prepped and draped in the routine sterile fashion and anesthetized with 1% local lidocaine.   A 5 FR arterial sheath was placed in the right femoral artery using a modified Seldinger technique. Standard catheters including a JL4, JR4 and angled pigtail were used. All catheter exchanges were made over a wire. A 7 FR venous sheath was placed in the right femoral vein using a modified Seldinger technique. A standard Swan-Ganz catheter was used for the procedure.   Complications:  None apparent  Total contrast: 80cc  Findings:  RA = 3 RV = 34/1/5 PA = 40/10 (21) PCW = 8 Fick cardiac output/index = 4.7/2.5 PVR = 2.7 Woods SVR = 1652 FA sat = 97% PA sat = 63%, 63%  Ao Pressure:  165/68 (101) LV Pressure:   151/3/11 There was no signficant gradient across the aortic valve on pullback.  Left main: Short. Mild plaque.  LAD: Large vessel wraps apex. Diffuse mild plaque. Two large diagonals each wit moderate diffuse disease. D1 ostium 60-70%. D2. Mid 70%. Collaterals from apical LAD to distal RCA.   LCX: Moderate to severe disease throughout mid to distal AV groove LCX with long 90% lesion. Several occluded OMs. Collaterals from distal LCX to distal RCA.   RCA: Totally occluded proximally. Faint R to R collaterals. Moderate L to R collaterals to distal RCA territory  LV-gram done in the RAO projection: Ejection fraction = 30% global HK   SVG - OM1: Occluded ostially  SVG - OM3: 95% ostal lesion. Graft otherwise looks good    Assessment: 1. Severe  native vessel CAD 2. SVG - OM1: occluded 3. SVG-OM3: 95% ostial lesion 4: iCM with EF 30% 5. Well compensated hemodynamics  Plan/Discussion:  PCI SVG-OM3. Continue medical therapy for HF.  Arvilla Meres, MD 1:11 PM

## 2013-04-26 NOTE — H&P (View-Only) (Signed)
Patient ID: Victor [name], male   DOB: [birth year], 77 y.o.   MRN: [id number] Subjective:  Dyspnea improved, denies chest pain  Objective:  Vital Signs in the last 24 hours: Temp:  [97.5 F (36.4 C)-97.8 F (36.6 C)] 97.8 F (36.6 C) (06/06 0509) Pulse Rate:  [71-80] 71 (06/06 0509) Resp:  [18-20] 18 (06/06 0509) BP: (112-162)/(58-74) 162/61 mmHg (06/06 0509) SpO2:  [93 %-100 %] 93 % (06/06 0509) Weight:  [166 lb 8 oz (75.524 kg)] 166 lb 8 oz (75.524 kg) (06/06 0509)  Intake/Output from previous day: 06/05 0701 - 06/06 0700 In: 1060 [P.O.:1060] Out: 2375 [Urine:2375] Intake/Output from this shift: Total I/O In: 0  Out: 150 [Urine:150]  Physical Exam: diskempt appearing NAD HEENT: Unremarkable Neck:  8 cm JVD, no thyromegally Back:  No CVA tenderness Lungs:  Clear except for basilar rales HEART:  Regular rate rhythm, no murmurs, no rubs, no clicks Abd:  soft, positive bowel sounds, no organomegally, no rebound, no guarding Ext:  2 plus pulses, no edema, no cyanosis, no clubbing Skin:  No rashes no nodules Neuro:  CN II through XII intact, motor grossly intact  Lab Results:  Recent Labs  04/24/13 1749  WBC 6.8  HGB 11.7*  PLT 164    Recent Labs  04/25/13 0830 04/26/13 0440  NA 139 138  K 3.9 3.9  CL 101 97  CO2 31 32  GLUCOSE 241* 190*  BUN 15 19  CREATININE 0.68 0.81    Recent Labs  04/24/13 2259 04/25/13 0830  TROPONINI <0.30 <0.30   Hepatic Function Panel  Recent Labs  04/24/13 1749  PROT 5.9*  ALBUMIN 3.3*  AST 19  ALT 16  ALKPHOS 102  BILITOT 0.3   No results found for this basename: CHOL,  in the last 72 hours No results found for this basename: PROTIME,  in the last 72 hours  Imaging: Dg Chest 2 View  04/24/2013   *RADIOLOGY REPORT*  Clinical Data: Short of breath.  CHEST - 2 VIEW  Comparison: 08/22/2012.  Findings: Cardiomegaly.  Left subclavian AICD.  Epicardial pacing apparatus appears similar to prior.  Compared to prior, there  is mild pulmonary vascular congestion.  Aortic arch atherosclerosis. No airspace disease.  No pleural effusion.  Cardiopericardial silhouette is mildly enlarged for projection. CABG.  IMPRESSION: Mild cardiomegaly and pulmonary vascular congestion.   Original Report Authenticated By: Andreas Newport, M.D.    Cardiac Studies: Tele - nsr Assessment/Plan:  1. Acute on chronic systolic CHF 2. VT - quiet 3. ICM, s/p CABG Rec: note plans for heart catheterization. Continue IV diuresis, directed by results of heart cath.  LOS: 2 days    Gregg Taylor,M.D. 04/26/2013, 8:14 AM

## 2013-04-26 NOTE — Progress Notes (Signed)
Patient had 11 beat run NSVT  Dr.Cooper notified patient asymptomatic. I will continue to monitor.

## 2013-04-26 NOTE — Progress Notes (Signed)
CSW was notified by nursing that patient wanted to complete an Advanced Directive and Health Care Power of Gerrit Friends had just reviewed and completed by patient and just required his signature, witnessing and Notary.  The document was reviewed, discussed; witnesses and notary completed. Copy placed on chart; original and 3 copies returned to patient and his wife.  No further CSW needs indicated.  Lorri Frederick. [place]  [phone]

## 2013-04-26 NOTE — Progress Notes (Signed)
Patient has been noted to refuse support services per [facility].  Contacted PCP office, Burnell Blanks, spoke with [place] CMA.  Informed her that patient has refused home support.  Requested that Dr Nathanial Rancher be made aware that [facility] services are available post discharge should she be able to convince that patient to participate.  She expressed that it would be reassessed during his follow up visit.  Of note, [facility] Care Management services does not replace or interfere with any services that are arranged by inpatient case management or social work.  For additional questions or referrals please contact Anibal Henderson BSN RN [facility] Liaison at [phone].

## 2013-04-26 NOTE — Interval H&P Note (Signed)
Cath Lab Visit (complete for each Cath Lab visit)  Clinical Evaluation Leading to the Procedure:   ACS: yes  Non-ACS:    Anginal Classification: CCS III  Anti-ischemic medical therapy: Maximal Therapy (2 or more classes of medications)  Non-Invasive Test Results: No non-invasive testing performed  Prior CABG: Previous CABG      History and Physical Interval Note:  04/26/2013 12:34 PM  Lenard Forth  has presented today for surgery, with the diagnosis of Chest pain  The various methods of treatment have been discussed with the patient and family. After consideration of risks, benefits and other options for treatment, the patient has consented to  Procedure(s): LEFT AND RIGHT HEART CATHETERIZATION WITH CORONARY/GRAFT ANGIOGRAM (N/A) as a surgical intervention .  The patient's history has been reviewed, patient examined, no change in status, stable for surgery.  I have reviewed the patient's chart and labs.  Questions were answered to the patient's satisfaction.     Winfield Caba

## 2013-04-26 NOTE — Interval H&P Note (Signed)
History and Physical Interval Note:  04/26/2013 12:33 PM  Victor [name]  has presented today for surgery, with the diagnosis of Chest pain and lv dysfunction  The various methods of treatment have been discussed with the patient and family. After consideration of risks, benefits and other options for treatment, the patient has consented to  Procedure(s): LEFT AND RIGHT HEART CATHETERIZATION WITH CORONARY/GRAFT ANGIOGRAM (N/A) as a surgical intervention .  The patient's history has been reviewed, patient examined, no change in status, stable for surgery.  I have reviewed the patient's chart and labs.  Questions were answered to the patient's satisfaction.     Erica Osuna

## 2013-04-26 NOTE — Care Management Note (Signed)
    Page 1 of 1   04/26/2013     4:06:32 PM   CARE MANAGEMENT NOTE 04/26/2013  Patient:  Victor [name], Victor [name]   Account Number:  [id number]  Date Initiated:  04/25/2013  Documentation initiated by:  Tera Mater  Subjective/Objective Assessment:   77yo male admitted with CHF.  Pt. lives with spouse in [place].     Action/Plan:   Discharge planning   Anticipated DC Date:  04/26/2013   Anticipated DC Plan:  HOME/SELF CARE      DC Planning Services  CM consult      Choice offered to / List presented to:          [facility] arranged  HH - 11 Patient Refused      Status of service:  In process, will continue to follow Medicare Important Message given?   (If response is "NO", the following Medicare IM given date fields will be blank) Date Medicare IM given:   Date Additional Medicare IM given:    Discharge Disposition:    Per UR Regulation:  Reviewed for med. necessity/level of care/duration of stay  If discussed at Long Length of Stay Meetings, dates discussed:    Comments:  04/26/2013.Marland KitchenMarland KitchenOletta Cohn, RN,BSN, [place] [phone] Pt continues to refuse [facility] services at this time.  04/25/13 1140 In to complete Heart Failure home health screen.  Pt. does not want home health at this time.  He states his spouse helps him to obtain his medications, and takes care of him.  NCM will follow for further dc needs. Tera Mater, RN, BSN NCM [phone]

## 2013-04-26 NOTE — Progress Notes (Signed)
Post PCI note:  Patient is at some shortness of breath since PCI. He was given IV Lasix. He's feeling a little better now. He did have a run of nonsustained VT. Reviewed labs from this morning. His magnesium was low. We'll replenish this. Also we'll check a metabolic panel since he has been diuresis.  With dyspnea as one of his primary problems, I do not think brilinta is a good drug for him. In fact, it may be the reason he had acute dyspnea this afternoon. Will change to Plavix.  Victor [name] 04/26/2013 6:44 PM

## 2013-04-26 NOTE — CV Procedure (Signed)
   CARDIAC CATH NOTE  Name: Victor [name] MRN: [id number] DOB: 03-26-1936  Procedure: PTCA and stenting of the SVG to OM3  Indication: Unstable angina. 77 year-old diabetic male with remote CABG presented with USAP. He underwent diagnostic cath by Dr Gala Romney and was found to have total occlusion of the SVG-OM1 and severe 90% stenosis of the SVG-OM3. The OM3 graft was his only target for PCI. He was given Brilinta 180 mg on the table and randomized according to the Regulate PCI Trial.  Procedural Details: There was an indwelling 5 Fr sheath. This was upsized to a 6 Fr sheath. Weight-based bivalirudin was given for anticoagulation. Once a therapeutic ACT was achieved, a 6 French LCB guide catheter was inserted.  A BMW coronary guidewire was used to cross the lesion.  The patient was given 500 mcg of intracoronary verapamil.  The lesion was then primarily stented with a 3.0x16 mm Promus Element drug-eluting stent.  The ostium was fully covered. Following PCI, there was 0% residual stenosis and TIMI-3 flow. Final angiography confirmed an excellent result. The patient tolerated the procedure well. There were no immediate procedural complications. Femoral hemostasis was achieved with a Perclose device. The patient was transferred to the post catheterization recovery area for further monitoring.  Lesion Data: Vessel: OM3/proximal body of graft (SVG) Percent stenosis (pre): 90 TIMI-flow (pre):  3 Stent:  3.0 x 16 mm Promus DES Percent stenosis (post): 0 TIMI-flow (post): 3  Conclusions: Successful PCI of the SVG-OM3  Recommendations: Aggressive medical therapy, ASA and brilinta x 12 months.  Tonny Bollman 04/26/2013, 2:01 PM

## 2013-04-27 LAB — CBC
HCT: 44.1 % (ref 39.0–52.0)
MCV: 86.1 fL (ref 78.0–100.0)
Platelets: 198 10*3/uL (ref 150–400)
RBC: 5.12 MIL/uL (ref 4.22–5.81)
RDW: 13.7 % (ref 11.5–15.5)
WBC: 14 10*3/uL — ABNORMAL HIGH (ref 4.0–10.5)

## 2013-04-27 LAB — GLUCOSE, CAPILLARY
Glucose-Capillary: 120 mg/dL — ABNORMAL HIGH (ref 70–99)
Glucose-Capillary: 144 mg/dL — ABNORMAL HIGH (ref 70–99)
Glucose-Capillary: 156 mg/dL — ABNORMAL HIGH (ref 70–99)

## 2013-04-27 LAB — BASIC METABOLIC PANEL
Chloride: 97 mEq/L (ref 96–112)
GFR calc Af Amer: >90 mL/min (ref >90)
GFR calc non Af Amer: 86 mL/min — ABNORMAL LOW (ref >90)
Potassium: 4.3 mEq/L (ref 3.5–5.1)
Sodium: 137 mEq/L (ref 135–145)

## 2013-04-27 NOTE — Progress Notes (Signed)
Ambulated pt 1000 ft  on room air, noted with episode of o2 sat 86-89 % on room air while walking,O2 sat increased to 92-94 % after  deep breathing / relaxation technique  , claimed with back pain (chronic pain) assisted back to bed , O2 SAT 99% on RA ,medicated for pain. See by dr Ladona Ridgel.

## 2013-04-27 NOTE — Progress Notes (Signed)
CARDIAC REHAB PHASE I   PRE:  Rate/Rhythm:   BP:  Supine:134/77   Sitting:   Standing:    SaO2 97% RA  MODE:  Ambulation: 460 ft   POST:  Rate/Rhythem: 92  BP:  Supine:   Sitting: 143/62  Standing:   SaO2: 98% RA  3:00-3:40 pm  Ambulated with assist x 1.  Walked at fast pace, some difficulty into the walk with back pain and he had to slow down, continuous oxygen monitoring, range was 97-98% on room air during the entire walk and patient denied any dyspnea.  Returned to room, his only complaint is the back pain.  Education completed.  Tolerated ambulation well.   Jackey Loge

## 2013-04-27 NOTE — Progress Notes (Signed)
Received to unit via wheelchair from 6500.  Alert and oriented, placed on monitor: centralized monitoring notified.  Vital signs stable.  Respiratory exchange even and unlabored.

## 2013-04-27 NOTE — Progress Notes (Signed)
Pt had run of vtacch(17 beats) while sitting up eating breakfast , asymptomatic, denies any discomforts. Relayed episode to [facility] PA.

## 2013-04-27 NOTE — Progress Notes (Signed)
Progress Note  Name:  Victor [name]  Date:  04/27/2013 9:49 AM    Subjective:  Patient still notes dyspnea with minimal exertion.  No CP   Objective:    Vital signs in last 24 hours: Temp:  [97.4 F (36.3 C)-98.6 F (37 C)] 98.1 F (36.7 C) (06/07 0753) Pulse Rate:  [38-80] 78 (06/07 0753) Resp:  [15-24] 18 (06/07 0753) BP: (106-158)/(34-80) 156/78 mmHg (06/07 0753) SpO2:  [95 %-100 %] 98 % (06/07 0753) Weight:  [166 lb 14.2 oz (75.7 kg)] 166 lb 14.2 oz (75.7 kg) (06/07 0500)  166 lb 14.2 oz (75.7 kg) Body mass index is 25.38 kg/(m^2).  Weight change: 6.2 oz (0.176 kg) Last BM Date: 04/24/13  Intake/Output from previous day: 06/06 0701 - 06/07 0700 In: 538.5 [P.O.:390; I.V.:148.5] Out: 1300 [Urine:1300]   Tele:  13 beat run NSVT this am  PHYSICAL EXAM General: no acute distress Neck:  No JVD Lungs: Clear to auscultation bilaterally  Heart: normal S1, S2;  Regular rate and rhythm, no murmur Abdomen: soft, nontender Extremities: no clubbing, cyanosis or edema; right groin without hematoma or bruit  Skin:  No rash; warm and dry Psych:  Normal affect  Lab Results: Cardiac Markers:   Recent Labs  04/24/13 2259 04/25/13 0830  TROPONINI <0.30 <0.30   CBC:   Recent Labs  04/24/13 1749 04/27/13 0547  WBC 6.8 14.0*  HGB 11.7* 14.9  HCT 34.7* 44.1  PLT 164 198   BMET:  Recent Labs  04/26/13 1857 04/27/13 0547  NA 135 137  K 4.0 4.3  CL 99 97  CO2 27 26  GLUCOSE 292* 128*  BUN 21 27*  CREATININE 0.76 0.79  CALCIUM 9.4 9.5   Hepatic Function:   Recent Labs  04/24/13 1749  PROT 5.9*  ALBUMIN 3.3*  AST 19  ALT 16  ALKPHOS 102  BILITOT 0.3   GFR:  Estimated Creatinine Clearance: 77.2 ml/min (by C-G formula based on Cr of 0.79). Lipids:  No results found for this basename: CHOL, TRIG, HDL, LDL,  in the last 72 hours   Radiology/Studies:  Dg Chest 2 View  04/24/2013   *RADIOLOGY REPORT*  Clinical Data: Short of breath.  CHEST - 2 VIEW   Comparison: 08/22/2012.  Findings: Cardiomegaly.  Left subclavian AICD.  Epicardial pacing apparatus appears similar to prior.  Compared to prior, there is mild pulmonary vascular congestion.  Aortic arch atherosclerosis. No airspace disease.  No pleural effusion.  Cardiopericardial silhouette is mildly enlarged for projection. CABG.  IMPRESSION: Mild cardiomegaly and pulmonary vascular congestion.   Original Report Authenticated By: Andreas Newport, M.D.     Assessment and Plan:  1. Dyspnea:  Etiology not clear.  Volume looks good.  Hemodynamics were good on RHC.  O2 sats low (77%) when I walked in room.  He had just gotten back from going to the [place].  Not sure this was accurate - repositioned oximeter and reading came up to 100%.  Dyspnea may be from Brilinta which was changed to Plavix yesterday.  CXR without ILD.  Check ambulatory O2. 2. CAD:  S/p Promus DES to S-OM3 04/26/13.  No CP.  Continue ASA, Plavix. 3. VTach:  Patient had another run NSVT this am.  K+ and Mg2+ ok.  He is s/p ICD.  Not sure why he is not on beta blocker.   4. A/C Systolic CHF:  Volume appears stable.  Continue current dose of Lasix. 5. Disposition:  Will review with Dr. Sharlot Gowda  Ladona Ridgel.  May be best to keep here today and observe with ongoing dyspnea.    Luna Glasgow, PA-C  9:49 AM 04/27/2013     Cardiology Attending  Patient seen and examined. Agree with Mr. Wende Mott exam and assessment. He remains dyspneic s/p SVG stent. Etiology unclear but oxygen saturation drops with exertion which would suggest a pulmonary component. Will keep in the hospital today, check CXR tomorrow. He may require home oxygen.  Leonia Reeves.D.

## 2013-04-28 LAB — GLUCOSE, CAPILLARY
Glucose-Capillary: 157 mg/dL — ABNORMAL HIGH (ref 70–99)
Glucose-Capillary: 190 mg/dL — ABNORMAL HIGH (ref 70–99)
Glucose-Capillary: 125 mg/dL — ABNORMAL HIGH (ref 70–99)

## 2013-04-28 MED ORDER — SODIUM CHLORIDE 0.9 % IV SOLN
INTRAVENOUS | Status: AC
  Administered 2013-04-28: 11:00:00 via INTRAVENOUS

## 2013-04-28 NOTE — Progress Notes (Signed)
See nursing assessment.  IV fluid completed per order.

## 2013-04-28 NOTE — Progress Notes (Signed)
Patient ID: Victor [name], male   DOB: [birth year], 77 y.o.   MRN: [id number] Subjective:  "I feel better, but still not normal"  Objective:  Vital Signs in the last 24 hours: Temp:  [97 F (36.1 C)-98.1 F (36.7 C)] 98.1 F (36.7 C) (06/08 0900) Pulse Rate:  [74-82] 74 (06/08 0900) Resp:  [15-19] 18 (06/08 0500) BP: (118-168)/(44-74) 147/52 mmHg (06/08 0900) SpO2:  [94 %-97 %] 96 % (06/08 0900) Weight:  [164 lb 3.9 oz (74.5 kg)-167 lb 1.6 oz (75.796 kg)] 167 lb 1.6 oz (75.796 kg) (06/08 0500)  Intake/Output from previous day: 06/07 0701 - 06/08 0700 In: 2215 [P.O.:2215] Out: 2445 [Urine:2445] Intake/Output from this shift: Total I/O In: 600 [P.O.:600] Out: -   Physical Exam: stable appearing 77 yo man, NAD HEENT: Unremarkable Neck:  6 cm JVD, no thyromegally Lungs:  Clear with no wheezes HEART:  Regular rate rhythm, no murmurs, no rubs, no clicks Abd:  soft, positive bowel sounds, no organomegally, no rebound, no guarding Ext:  2 plus pulses, no edema, no cyanosis, no clubbing Skin:  No rashes no nodules Neuro:  CN II through XII intact, motor grossly intact  Lab Results:  Recent Labs  04/27/13 0547  WBC 14.0*  HGB 14.9  PLT 198    Recent Labs  04/26/13 1857 04/27/13 0547  NA 135 137  K 4.0 4.3  CL 99 97  CO2 27 26  GLUCOSE 292* 128*  BUN 21 27*  CREATININE 0.76 0.79   No results found for this basename: TROPONINI, CK, MB,  in the last 72 hours Hepatic Function Panel No results found for this basename: PROT, ALBUMIN, AST, ALT, ALKPHOS, BILITOT, BILIDIR, IBILI,  in the last 72 hours No results found for this basename: CHOL,  in the last 72 hours No results found for this basename: PROTIME,  in the last 72 hours  Imaging: No results found.  Cardiac Studies: Tele - nsr Assessment/Plan:  1. Acute on chronic systolic CHF - patient may well be over diuresed. His skin turgor on exam is reduced. I will give a small fluid bolus. 2. ICM, s/p cath/PCI -  denies anginal symptoms. 3. DM - stable Rec: will ambulate and hydrate. Hopefully home tomorrow.  LOS: 4 days    Gregg Taylor,M.D. 04/28/2013, 10:25 AM

## 2013-04-29 LAB — BASIC METABOLIC PANEL
CO2: 29 mEq/L (ref 19–32)
Chloride: 97 mEq/L (ref 96–112)
Glucose, Bld: 150 mg/dL — ABNORMAL HIGH (ref 70–99)
Sodium: 134 mEq/L — ABNORMAL LOW (ref 135–145)

## 2013-04-29 LAB — GLUCOSE, CAPILLARY
Glucose-Capillary: 158 mg/dL — ABNORMAL HIGH (ref 70–99)
Glucose-Capillary: 126 mg/dL — ABNORMAL HIGH (ref 70–99)

## 2013-04-29 MED ORDER — LISINOPRIL 2.5 MG PO TABS: 2.5000 mg | ORAL_TABLET | Freq: Every day | ORAL | Status: DC

## 2013-04-29 MED ORDER — CLOPIDOGREL BISULFATE 75 MG PO TABS: 75.0000 mg | ORAL_TABLET | Freq: Every day | ORAL | Status: DC

## 2013-04-29 MED ORDER — CARVEDILOL 3.125 MG PO TABS: 3.1250 mg | ORAL_TABLET | Freq: Two times a day (BID) | ORAL | Status: DC

## 2013-04-29 NOTE — Progress Notes (Signed)
Pt discharged per w/c with all belongings. Accompanied by volunteer. Wife went ahead to get the car. PT is aware he is not to drive for 1 week.

## 2013-04-29 NOTE — Progress Notes (Signed)
Pt had a good night, no incidents to mention and pt is breathing a lot better, will continue to monitor, Ethelene Hal RN

## 2013-04-29 NOTE — Discharge Summary (Signed)
CARDIOLOGY DISCHARGE SUMMARY    Patient ID: Victor [name],  MRN: [id number], DOB/AGE: 04/30/1936 77 y.o.  Admit date: 04/24/2013 Discharge date: 04/29/2013  Primary Care Physician: Burnell Blanks, MD  Primary Cardiologist: Lewayne Bunting, MD  Primary Discharge Diagnosis:  1. Acute on chronic systolic HF 2. [place] with known CAD, now s/p PCI of SVG to OM-3  Secondary Discharge Diagnoses:  1. Ischemic CM 2. Sustained VT with syncope and +EPS s/p ICD implant (initially in 1990, then 2004, 2005 and 2012)  3. DM 4. PVD 5. Osteoarthritis  Procedures This Admission:  1. Right and left heart cathererization with grafts and LV gram 04/26/2013 Findings:  RA = 3  RV = 34/1/5  PA = 40/10 (21)  PCW = 8  Fick cardiac output/index = 4.7/2.5  PVR = 2.7 Woods  SVR = 1652  FA sat = 97%  PA sat = 63%, 63%  Ao Pressure: 165/68 (101)  LV Pressure: 151/3/11  There was no signficant gradient across the aortic valve on pullback.  Left main: Short. Mild plaque.  LAD: Large vessel wraps apex. Diffuse mild plaque. Two large diagonals each wit moderate diffuse disease. D1 ostium 60-70%. D2. Mid 70%. Collaterals from apical LAD to distal RCA.  LCX: Moderate to severe disease throughout mid to distal AV groove LCX with long 90% lesion. Several occluded OMs. Collaterals from distal LCX to distal RCA.  RCA: Totally occluded proximally. Faint R to R collaterals. Moderate L to R collaterals to distal RCA territory  LV-gram done in the RAO projection: Ejection fraction = 30% global HK  SVG - OM1: Occluded ostially  SVG - OM3: 95% ostal lesion. Graft otherwise looks good  Assessment:  - Severe native vessel CAD  - SVG - OM1: occluded  - SVG-OM3: 95% ostial lesion  - iCM with EF 30%  - Well compensated hemodynamics  Plan/Discussion:  PCI SVG-OM3. Continue medical therapy for HF.  2. PTCA and stenting of the SVG to [facility] 04/26/2013 Lesion Data:  Vessel: OM3/proximal body of graft (SVG)  Percent stenosis  (pre): 90  TIMI-flow (pre): 3  Stent: 3.0 x 16 mm Promus DES  Percent stenosis (post): 0  TIMI-flow (post): 3  Conclusions: Successful PCI of the SVG-OM3   3. 2D echo 04/25/2013 Study Conclusions - Left ventricle: Extremely poor acoustic windows limit study. Overal LV systolic function appears depressed, at least moderately; some images appear severe. Inferior and posterior walls appear severely hypokinetic to akinetic. The cavity size was normal. Wall thickness was normal. Doppler parameters are consistent with abnormal left ventricular relaxation (grade 1 diastolic dysfunction). - Mitral valve: Mild regurgitation. - Left atrium: The atrium was moderately dilated.  History and Hospital Course:  [name] is a 77 year old man with an ischemic CM, chronic systolic HF, CAD s/p CABG 1990 and sustained VT and syncope s/p ICD implant who presented to the office on 04/24/2013 with worsening HF symptoms and chest pain. Over the last 2-3 weeks he has experienced progressive exercise intolerance, orthopnea, nocturnal dyspnea and exertional chest discomfort which is relieved by rest. He was admitted directly from the office with acute on chronic systolic HF and [place]. CEs negative. He was diuresed with IV Lasix with improved SOB and orthopnea. He then underwent a right and left heart catheterization on 04/26/2013 with details as outlined above. He is now s/p PCI of SVG to OM-3. He was continued on medical therapy with the addition of Brilinta. However, due to an episode of acute dyspnea  after his procedure, Dr. Excell Seltzer elected to discontinue Brilinta in favor of Plavix which he will continue for at least 12 months. Of note, he was enrolled in the [facility] research trial. He was initiated on appropriate medical therapy for HF including lisinopril and carvedilol. He experienced brief NSVT on telemetry which was asymptomatic. His magnesium was low and this was repleted. His rate and rhythm remained stable.  He was kept for further observation and on 04/27/2013 was ambulating without difficulty or hypoxia. He remains hemodynamically stable. He has been seen, examined and deemed stable for discharge today by Dr. Lewayne Bunting. He will follow-up in our office in 1 week on 05/07/2013.   Discharge Vitals: Blood pressure 156/50, pulse 81, temperature 98.5 F (36.9 C), temperature source Oral, resp. rate 20, height 5\' 8"  (1.727 m), weight 167 lb 1.7 oz (75.8 kg), SpO2 92.00%.   Labs: Lab Results  Component Value Date   WBC 14.0* 04/27/2013   HGB 14.9 04/27/2013   HCT 44.1 04/27/2013   MCV 86.1 04/27/2013   PLT 198 04/27/2013    Recent Labs Lab 04/24/13 1749  04/29/13 0440  NA 139  < > 134*  K 4.2  < > 3.9  CL 105  < > 97  CO2 26  < > 29  BUN 13  < > 22  CREATININE 0.69  < > 0.78  CALCIUM 8.7  < > 9.0  PROT 5.9*  --   --   BILITOT 0.3  --   --   ALKPHOS 102  --   --   ALT 16  --   --   AST 19  --   --   GLUCOSE 239*  < > 150*  < > = values in this interval not displayed. Lab Results  Component Value Date   TROPONINI <0.30 04/25/2013     Disposition:  The patient is being discharged in stable condition.  Follow-up:     Follow-up Information   Follow up with Rick Duff, PA-C On 05/07/2013. (At 12:00 noon for hospital follow-up)    Contact information:   [facility] [address] [place] [address] [phone]      Follow up with Lewayne Bunting, MD On 07/30/2013. (At 3:45 PM)    Contact information:   [address] [place] [address] [phone]     Discharge Medications:    Medication List    STOP taking these medications       cilostazol 100 MG tablet  Commonly known as:  PLETAL      TAKE these medications       ASPIR-81 81 MG EC tablet  Generic drug:  aspirin  Take 81 mg by mouth at bedtime.     carvedilol 3.125 MG tablet  Commonly known as:  COREG  Take 1 tablet (3.125 mg total) by mouth 2 (two) times daily with a meal.       clopidogrel 75 MG tablet  Commonly known as:  PLAVIX  Take 1 tablet (75 mg total) by mouth daily with breakfast.     etodolac 400 MG tablet  Commonly known as:  LODINE  Take 400 mg by mouth at bedtime.     furosemide 20 MG tablet  Commonly known as:  LASIX  Take 20 mg by mouth daily.     HUMULIN 70/30 (70-30) 100 UNIT/ML injection  Generic drug:  insulin NPH-regular  Inject 30 Units into the skin 2 (two) times daily with  a meal. 30units in the am and 20units in the pm     lisinopril 2.5 MG tablet  Commonly known as:  PRINIVIL,ZESTRIL  Take 1 tablet (2.5 mg total) by mouth daily.     LORCET 5-325 MG per tablet  Generic drug:  HYDROcodone-acetaminophen  Take 1 tablet by mouth every 6 (six) hours as needed for pain.     LYRICA 75 MG capsule  Generic drug:  pregabalin  Take 150 mg by mouth daily.     metFORMIN 1000 MG tablet  Commonly known as:  GLUCOPHAGE  Take 1,000 mg by mouth 2 (two) times daily with a meal.     potassium chloride 20 MEQ packet  Commonly known as:  KLOR-CON  Take 20 mEq by mouth every other day.     pravastatin 40 MG tablet  Commonly known as:  PRAVACHOL  Take 40 mg by mouth at bedtime.     SKELAXIN 800 MG tablet  Generic drug:  metaxalone  Take 800 mg by mouth 2 (two) times daily.     vitamin B-12 1000 MCG tablet  Commonly known as:  CYANOCOBALAMIN  Take 1,000 mcg by mouth daily.       Duration of Discharge Encounter: Greater than 30 minutes including physician time.  Signed, Rick Duff, PA-C 04/29/2013, 8:29 AM  Cardiology Attending  Patient seen and examined. Discussed all aspects of discharge with [employer], PA-C. Agree with above. Ok for discharge. Will add low dose beta blocker and stop Pletal.  Leonia Reeves.d.

## 2013-04-29 NOTE — Progress Notes (Signed)
Went over all discharge instructions with pt and wife including meds , follow up visits and post heart cath site care .

## 2013-04-29 NOTE — Progress Notes (Signed)
     Patient: DEAUNDRE ALLSTON Date of Encounter: 04/29/2013, 7:37 AM Admit date: 04/24/2013     Subjective  Mr. Troia has no new complaints this AM. He denies CP, SOB or palpitations.   Objective  Physical Exam: Vitals: BP 144/73  Pulse 81  Temp(Src) 98.5 F (36.9 C) (Oral)  Resp 20  Ht 5\' 8"  (1.727 m)  Wt 167 lb 1.7 oz (75.8 kg)  BMI 25.41 kg/m2  SpO2 92% General: Well developed, well appearing 77 year old male in no acute distress. Neck: Supple. JVD not elevated. Lungs: Clear bilaterally to auscultation without wheezes, rales, or rhonchi. Breathing is unlabored. Heart: RRR S1 S2 without murmurs, rubs, or gallops.  Abdomen: Soft, non-distended. Extremities: No clubbing or cyanosis. No edema.  Distal pedal pulses are 2+ and equal bilaterally. Neuro: Alert and oriented X 3. Moves all extremities spontaneously. No focal deficits.  Intake/Output:  Intake/Output Summary (Last 24 hours) at 04/29/13 0737 Last data filed at 04/29/13 0600  Gross per 24 hour  Intake   1850 ml  Output   3900 ml  Net  -2050 ml    Inpatient Medications:  . aspirin EC  81 mg Oral Daily  . clopidogrel  75 mg Oral Q breakfast  . furosemide  40 mg Intravenous BID  . insulin aspart  0-15 Units Subcutaneous TID WC  . insulin aspart protamine- aspart  20 Units Subcutaneous Q supper  . insulin aspart protamine- aspart  30 Units Subcutaneous Q breakfast  . lisinopril  2.5 mg Oral Daily  . pregabalin  150 mg Oral Daily  . simvastatin  20 mg Oral q1800    Labs:  Recent Labs  04/26/13 1857 04/27/13 0547 04/29/13 0440  NA 135 137 134*  K 4.0 4.3 3.9  CL 99 97 97  CO2 27 26 29   GLUCOSE 292* 128* 150*  BUN 21 27* 22  CREATININE 0.76 0.79 0.78  CALCIUM 9.4 9.5 9.0  MG 1.9 2.4  --     Recent Labs  04/27/13 0547  WBC 14.0*  HGB 14.9  HCT 44.1  MCV 86.1  PLT 198    Radiology/Studies: Dg Chest 2 View  04/24/2013   *RADIOLOGY REPORT*  Clinical Data: Short of breath.  CHEST - 2 VIEW   Comparison: 08/22/2012.  Findings: Cardiomegaly.  Left subclavian AICD.  Epicardial pacing apparatus appears similar to prior.  Compared to prior, there is mild pulmonary vascular congestion.  Aortic arch atherosclerosis. No airspace disease.  No pleural effusion.  Cardiopericardial silhouette is mildly enlarged for projection. CABG.  IMPRESSION: Mild cardiomegaly and pulmonary vascular congestion.   Original Report Authenticated By: Andreas Newport, M.D.    Telemetry: SR currently; brief NSVT over weekend    Assessment and Plan  1. CAD s/p PCI to SVG to OM-3 - continue medical therapy including ASA and Plavix x 12 months 2. Acute on chronic systolic HF - improved; will continue lisinopril and add low dose carvedilol 3. Ischemic CM - continue medical therapy as above 4. History of sustained VT with syncope and +EPS s/p ICD implant (initially in 1990, then 2004, 2005 and 2012)   Dr. Ladona Ridgel to see Signed, Rick Duff PA-C

## 2013-04-29 NOTE — Progress Notes (Signed)
04/29/13 1300 Pt. to dc home today with family.  Refused home health services. Tera Mater, RN, BSN NCM [phone]

## 2013-05-01 ENCOUNTER — Other Ambulatory Visit: Payer: Self-pay | Admitting: Orthopedic Surgery

## 2013-05-01 DIAGNOSIS — M545 Low back pain: Secondary | ICD-10-CM

## 2013-05-02 NOTE — Progress Notes (Signed)
For taylor

## 2013-05-06 NOTE — Progress Notes (Deleted)
   ELECTROPHYSIOLOGY OFFICE NOTE  Patient ID: Victor [name] MRN: [id number], DOB/AGE: June 06, 1936   Date of Visit: 05/06/2013  Primary Physician: Ailene Ravel, MD Primary Cardiologist: Lewayne Bunting, MD Reason for Visit: Hospital follow-up    This encounter was created in error - please disregard. Office visit was changes to phys defib check per scheduling. Please see phys defib check visit same day.

## 2013-05-07 ENCOUNTER — Encounter: Payer: Self-pay | Admitting: Cardiology

## 2013-05-07 ENCOUNTER — Ambulatory Visit (INDEPENDENT_AMBULATORY_CARE_PROVIDER_SITE_OTHER): Payer: Medicare Other | Admitting: Cardiology

## 2013-05-07 ENCOUNTER — Encounter: Payer: Medicare Other | Admitting: Cardiology

## 2013-05-07 VITALS — BP 138/70 | HR 57 | Ht 68.0 in | Wt 180.1 lb

## 2013-05-07 DIAGNOSIS — Z9861 Coronary angioplasty status: Secondary | ICD-10-CM

## 2013-05-07 DIAGNOSIS — I5022 Chronic systolic (congestive) heart failure: Secondary | ICD-10-CM

## 2013-05-07 DIAGNOSIS — I255 Ischemic cardiomyopathy: Secondary | ICD-10-CM

## 2013-05-07 DIAGNOSIS — I472 Ventricular tachycardia: Secondary | ICD-10-CM

## 2013-05-07 DIAGNOSIS — I2510 Atherosclerotic heart disease of native coronary artery without angina pectoris: Secondary | ICD-10-CM

## 2013-05-07 DIAGNOSIS — I2589 Other forms of chronic ischemic heart disease: Secondary | ICD-10-CM

## 2013-05-07 DIAGNOSIS — Z955 Presence of coronary angioplasty implant and graft: Secondary | ICD-10-CM

## 2013-05-07 DIAGNOSIS — Z95810 Presence of automatic (implantable) cardiac defibrillator: Secondary | ICD-10-CM | POA: Diagnosis not present

## 2013-05-07 LAB — ICD DEVICE OBSERVATION
DEV-0020ICD: NEGATIVE
DEVICE MODEL ICD: 118170
HV IMPEDENCE: 51 Ohm
PACEART VT: 0
RV LEAD AMPLITUDE: 15.6 mv
TZON-0003SLOWVT: 363.6 ms
VENTRICULAR PACING ICD: 0 pct

## 2013-05-07 MED ORDER — CARVEDILOL 3.125 MG PO TABS: 6.2500 mg | ORAL_TABLET | Freq: Two times a day (BID) | ORAL | Status: DC

## 2013-05-07 NOTE — Progress Notes (Signed)
ELECTROPHYSIOLOGY OFFICE NOTE  Patient ID: Victor [name] MRN: [id number], DOB/AGE: Nov 13, 1936   Date of Visit: 05/07/2013  Primary Physician: Ailene Ravel, MD Primary Cardiologist: Lewayne Bunting, MD Reason for Visit: Hospital follow-up  History of Present Illness  Victor [name] is a 77 year old man with an ischemic CM, chronic systolic HF, CAD s/p CABG 1990 and sustained VT and syncope s/p ICD implant who was admitted to [facility] on 04/24/2013 with acute on chronic systolic HF and [place]. CEs negative. He was diuresed with IV Lasix with improved SOB and orthopnea. He then underwent a right and left heart catheterization on 04/26/2013 with details as outlined below. He is now s/p PCI of SVG to OM-3. He was continued on medical therapy with the addition of Brilinta. However, due to an episode of acute dyspnea after his procedure, Dr. Excell Seltzer elected to discontinue Brilinta in favor of Plavix which he will continue for at least 12 months. Of note, he was enrolled in the [facility] research trial. He was initiated on appropriate medical therapy for HF including lisinopril and carvedilol.  He presents today for hospital followup. Since discharge, he reports he is doing okay. Today, he denies chest pain or shortness of breath. He denies palpitations, dizziness, near syncope or syncope. He denies LE swelling, orthopnea, PND or recent weight gain. Victor [name] states that he is compliant and tolerating medications without difficulty.  Of note, Victor [name] has h/o sustained VT with syncope and easily inducible VT by EPS. He underwent initial ICD insertion in 1990 with epicardial patches placed at that time. His initial system utilized two epicardial-0040 patches, serial numbers [id number] and serial numbers [id number]. His epicardial pacing leads were epicardial screws model 4312, serial number [id number] and [id number]. He underwent ICD generator change in 2004. His ICD battery was then found to be at ERI prematurely and in 2005  he underwent removal of a previously implanted ICD with capping of the old epicardial leads and patches, followed by insertion of a new transvenous single-chamber defibrillator by way of the left subclavian vein. 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  Past Medical History Past Medical History  Diagnosis Date  . Arthropathy, unspecified, site unspecified   . Occlusion and stenosis of carotid artery without mention of cerebral infarction   . Raynaud's syndrome   . Congestive heart failure, unspecified   . Paroxysmal ventricular tachycardia   . Other specified forms of chronic ischemic heart disease   . Peripheral vascular disease   . Cardiomyopathy, ischemic   . Automatic implantable cardioverter-defibrillator in situ   . High cholesterol   . Myocardial infarction 1990    "4-5 while in [facility] til they put in ICD" (04/24/2013)  . Shortness of breath     "both exertion and lying down" (04/24/2013)  . Type II or unspecified type diabetes mellitus without mention of complication, not stated as uncontrolled   . GERD (gastroesophageal reflux disease)   . Osteoarthrosis, unspecified whether generalized or localized, unspecified site   . Degenerative joint disease   . Arthritis     "back and hands" (04/24/2013)  . Chronic lower back pain     Past Surgical History Past Surgical History  Procedure Laterality Date  . Anal fistulectomy  2000's  . Cataract extraction, bilateral Bilateral ~ 2009  . Coronary artery bypass graft  01/1989    "CABG X3" 96/02/2013)  . Cardiac defibrillator placement  02/1989-12/2011    "I've had a total of 6 or 7  defibrillators put in" (04/24/2013)  . Finger fracture surgery Left ~ 2000    "pointer" (04/24/2013)    Allergies/Intolerances No Known Allergies  Current Home Medications Current Outpatient Prescriptions  Medication Sig Dispense Refill  . aspirin (ASPIR-81) 81 MG EC tablet Take 81 mg by mouth at bedtime.       . carvedilol (COREG)  3.125 MG tablet Take 2 tablets (6.25 mg total) by mouth 2 (two) times daily with a meal.  60 tablet  4  . clopidogrel (PLAVIX) 75 MG tablet Take 1 tablet (75 mg total) by mouth daily with breakfast.  30 tablet  11  . etodolac (LODINE) 400 MG tablet Take 400 mg by mouth at bedtime.       . furosemide (LASIX) 20 MG tablet Take 20 mg by mouth daily.        Marland Kitchen HYDROcodone-acetaminophen (LORCET) 5-325 MG per tablet Take 1 tablet by mouth every 6 (six) hours as needed for pain.      Marland Kitchen insulin NPH-insulin regular (HUMULIN 70/30) (70-30) 100 UNIT/ML injection Inject 30 Units into the skin 2 (two) times daily with a meal. 30units in the am and 20units in the pm      . lisinopril (PRINIVIL,ZESTRIL) 2.5 MG tablet Take 1 tablet (2.5 mg total) by mouth daily.  30 tablet  4  . metaxalone (SKELAXIN) 800 MG tablet Take 800 mg by mouth 2 (two) times daily.        . metFORMIN (GLUCOPHAGE) 1000 MG tablet Take 1,000 mg by mouth 2 (two) times daily with a meal.      . potassium chloride (KLOR-CON) 20 MEQ packet Take 20 mEq by mouth every other day.        . pravastatin (PRAVACHOL) 40 MG tablet Take 40 mg by mouth at bedtime.       . pregabalin (LYRICA) 75 MG capsule Take 150 mg by mouth daily.       . vitamin B-12 (CYANOCOBALAMIN) 1000 MCG tablet Take 1,000 mcg by mouth daily.       No current facility-administered medications for this visit.   Social History Social History  . Marital Status: Married   Social History Main Topics  . Smoking status: Never Smoker   . Smokeless tobacco: Never Used  . Alcohol Use: Yes     Comment: 04/24/2013 "wine w/dinner 15-20 yr ago"  . 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     Review of Systems General: No chills, fever, night sweats or weight changes Cardiovascular: No chest pain, dyspnea on exertion, edema,  orthopnea, palpitations, paroxysmal nocturnal dyspnea Dermatological: No rash, lesions or masses Respiratory: No cough, dyspnea Urologic: No hematuria, dysuria Abdominal: No nausea, vomiting, diarrhea, bright red blood per rectum, melena, or hematemesis Neurologic: No visual changes, weakness, changes in mental status All other systems reviewed and are otherwise negative except as noted above.  Physical Exam Blood pressure 138/70, pulse 57, height 5\' 8"  (1.727 m), weight 180 lb 2 oz (81.704 kg).  General: Well developed, well appearing 77 year old male in no acute distress. HEENT: Normocephalic, atraumatic. EOMs intact. Sclera nonicteric. Oropharynx clear.  Neck: Supple without bruits. No JVD. Lungs: Respirations regular and unlabored, CTA bilaterally. No wheezes, rales or rhonchi. Heart: RRR. S1, S2 present. No murmurs, rub, S3 or S4. Abdomen: Soft, non-tender, non-distended.  BS present x 4 quadrants. No hepatosplenomegaly.  Extremities: No clubbing, cyanosis or edema. DP/PT/Radials 2+ and equal bilaterally. Psych: Normal affect. Neuro: Alert and oriented X 3. Moves all extremities spontaneously.   Diagnostics 1. Right and left heart cathererization with grafts and LV gram 04/26/2013  Findings:  RA = 3  RV = 34/1/5  PA = 40/10 (21)  PCW = 8  Fick cardiac output/index = 4.7/2.5  PVR = 2.7 Woods  SVR = 1652  FA sat = 97%  PA sat = 63%, 63%  Ao Pressure: 165/68 (101)  LV Pressure: 151/3/11  There was no signficant gradient across the aortic valve on pullback.  Left main: Short. Mild plaque.  LAD: Large vessel wraps apex. Diffuse mild plaque. Two large diagonals each wit moderate diffuse disease. D1 ostium 60-70%. D2. Mid 70%. Collaterals from apical LAD to distal RCA.  LCX: Moderate to severe disease throughout mid to distal AV groove LCX with long 90% lesion. Several occluded OMs. Collaterals from distal LCX to distal RCA.  RCA: Totally occluded proximally. Faint R to R  collaterals. Moderate L to R collaterals to distal RCA territory  LV-gram done in the RAO projection: Ejection fraction = 30% global HK  SVG - OM1: Occluded ostially  SVG - OM3: 95% ostal lesion. Graft otherwise looks good  Assessment:  - Severe native vessel CAD  - SVG - OM1: occluded  - SVG-OM3: 95% ostial lesion  - iCM with EF 30%  - Well compensated hemodynamics  Plan/Discussion:  PCI SVG-OM3. Continue medical therapy for HF.   2. PTCA and stenting of the SVG to [facility] 04/26/2013  Lesion Data:  Vessel: OM3/proximal body of graft (SVG)  Percent stenosis (pre): 90  TIMI-flow (pre): 3  Stent: 3.0 x 16 mm Promus DES  Percent stenosis (post): 0  TIMI-flow (post): 3  Conclusions: Successful PCI of the SVG-OM3   3. 2D echo 04/25/2013  Study Conclusions - Left ventricle: Extremely poor acoustic windows limit study. Overal LV systolic function appears depressed, at least moderately; some images appear severe. Inferior and posterior walls appear severely hypokinetic to akinetic. The cavity size was normal. Wall thickness was normal. Doppler parameters are consistent with abnormal left ventricular relaxation (grade 1 diastolic dysfunction). - Mitral valve: Mild regurgitation. - Left atrium: The atrium was moderately dilated.   12-lead ECG today - sinus bradycardia with first degree AV block PR 264, inferior Q waves, lateral T wave inversions, PACs; overall unchanged from previous ECGs Device interrogation today - Normal device function. Thresholds and sensing consistent with previous device measurements. Impedance trends stable over time. 1 VT episode successfully terminated with ATP x1. 33 NSVT episodes. Histogram distribution appropriate for patient and level of activity. No changes made this session. Device programmed at appropriate safety margins. Device programmed to optimize intrinsic conduction. Estimated longevity 12 years.    Assessment and Plan 1. CAD s/p recent PCI Continue  ASA and Plavix x 12 months Continue BB, ACEI and statin Continue aggressive secondary prevention/risk factor modification with tight BP and blood sugar control in addition to low fat, low cholesterol diet 2. Ischemic CM with chronic systolic HF Up-titrate Coreg to 6.25 mg twice daily Continue lisinopril 2.5 mg once daily with repeat BMET today Euvolemic by exam today 3. Paroxysmal VT Several nonsustained episodes; 1 episode terminated with ATP x1 Asymptomatic Continue BB and will up-titrate as above 4. Elevated blood glucose at home with known DM Follow-up this week with PCP  Signed, Rick Duff, PA-C 05/07/2013, 3:46  PM

## 2013-05-07 NOTE — Patient Instructions (Addendum)
Your physician recommends that you schedule a follow-up appointment in: 2 WEEKS WITH [facility]  INCREASE CARVEDILOL TO 6.25 MG TWICE DAILY  Your physician recommends that you HAVE LAB WORK TODAY

## 2013-05-08 ENCOUNTER — Telehealth: Payer: Self-pay | Admitting: *Deleted

## 2013-05-08 LAB — BASIC METABOLIC PANEL
Calcium: 9.3 mg/dL (ref 8.4–10.5)
GFR: 100.11 mL/min (ref >60.00)
Glucose, Bld: 44 mg/dL — CL (ref 70–99)
Potassium: 4 mEq/L (ref 3.5–5.1)
Sodium: 142 mEq/L (ref 135–145)

## 2013-05-08 NOTE — Telephone Encounter (Signed)
[place] lab called this AM regarding labs results drowned yesterday 05/07/13 : pt's blood sugar was 44 (LL) I called pt to find out how he was doing, unable to speak to pt  left a message X 2.

## 2013-05-09 ENCOUNTER — Encounter: Payer: Self-pay | Admitting: Cardiology

## 2013-05-09 ENCOUNTER — Encounter: Payer: Medicare Other | Admitting: Internal Medicine

## 2013-05-09 NOTE — Progress Notes (Signed)
This encounter was created in error - please disregard.

## 2013-05-15 DIAGNOSIS — E1149 Type 2 diabetes mellitus with other diabetic neurological complication: Secondary | ICD-10-CM | POA: Diagnosis not present

## 2013-05-15 DIAGNOSIS — Z9181 History of falling: Secondary | ICD-10-CM | POA: Diagnosis not present

## 2013-05-23 ENCOUNTER — Other Ambulatory Visit: Payer: Self-pay

## 2013-05-23 DIAGNOSIS — I5022 Chronic systolic (congestive) heart failure: Secondary | ICD-10-CM

## 2013-05-23 DIAGNOSIS — Z955 Presence of coronary angioplasty implant and graft: Secondary | ICD-10-CM

## 2013-05-23 DIAGNOSIS — I472 Ventricular tachycardia: Secondary | ICD-10-CM

## 2013-05-23 DIAGNOSIS — I255 Ischemic cardiomyopathy: Secondary | ICD-10-CM

## 2013-05-23 DIAGNOSIS — Z95810 Presence of automatic (implantable) cardiac defibrillator: Secondary | ICD-10-CM

## 2013-05-23 DIAGNOSIS — I2510 Atherosclerotic heart disease of native coronary artery without angina pectoris: Secondary | ICD-10-CM

## 2013-05-23 MED ORDER — CARVEDILOL 6.25 MG PO TABS: 6.2500 mg | ORAL_TABLET | Freq: Two times a day (BID) | ORAL | Status: DC

## 2013-05-23 NOTE — Telephone Encounter (Signed)
..   Requested Prescriptions   Signed Prescriptions Disp Refills  . carvedilol (COREG) 6.25 MG tablet 180 tablet 3    Sig: Take 1 tablet (6.25 mg total) by mouth 2 (two) times daily.    Authorizing Provider: Minda Meo    Ordering User: Christella Hartigan, Jazmene Racz Judie Petit

## 2013-05-27 NOTE — Progress Notes (Signed)
ELECTROPHYSIOLOGY OFFICE NOTE  Patient ID: CARTRELL BENTSEN MRN: [id number], DOB/AGE: 75-May-1937   Date of Visit: 05/28/2013  Primary Physician: Burnell Blanks, MD Primary Cardiologist: Lewayne Bunting, MD Reason for Visit: EP/device follow-up  History of Present Illness  KEEON ZURN is a 77 year old man with an ischemic CM, chronic systolic HF, CAD s/p CABG 1990 and sustained VT and syncope s/p ICD implant who was admitted to [facility] on 04/24/2013 with acute on chronic systolic HF and [place]. CEs negative. He was diuresed with IV Lasix with improved SOB and orthopnea. He then underwent a right and left heart catheterization on 04/26/2013 with details as outlined below. He is now s/p PCI of SVG to OM-3. He was continued on medical therapy with the addition of Brilinta. However, due to an episode of acute dyspnea after his procedure, Dr. Excell Seltzer elected to discontinue Brilinta in favor of Plavix which he will continue for at least 12 months. Of note, he was enrolled in the [facility] research trial. He was initiated on appropriate medical therapy for HF including lisinopril and carvedilol.  He presents today for 2-week followup. I last saw him on 05/07/2013 for up-titration of his HF therapy. Today, he reports he feels terrible. He has lightheadedness and dyspnea on exertion. He has had one episode of chest pain while lying down to sleep a few nights ago. He denies exertional CP. He denies palpitations or frank syncope. He denies LE swelling, orthopnea, PND or recent weight gain. Mr. Wedemeyer states that he is compliant with his medications.  Past Medical History Past Medical History  Diagnosis Date  . Arthropathy, unspecified, site unspecified   . Occlusion and stenosis of carotid artery without mention of cerebral infarction   . Raynaud's syndrome   . Congestive heart failure, unspecified   . Paroxysmal ventricular tachycardia   . Other specified forms of chronic ischemic heart disease   . Peripheral  vascular disease   . Cardiomyopathy, ischemic   . Automatic implantable cardioverter-defibrillator in situ   . High cholesterol   . Myocardial infarction 1990    "4-5 while in [facility] til they put in ICD" (04/24/2013)  . Shortness of breath     "both exertion and lying down" (04/24/2013)  . Type II or unspecified type diabetes mellitus without mention of complication, not stated as uncontrolled   . GERD (gastroesophageal reflux disease)   . Osteoarthrosis, unspecified whether generalized or localized, unspecified site   . Degenerative joint disease   . Arthritis     "back and hands" (04/24/2013)  . Chronic lower back pain     Past Surgical History Past Surgical History  Procedure Laterality Date  . Anal fistulectomy  2000's  . Cataract extraction, bilateral Bilateral ~ 2009  . Coronary artery bypass graft  01/1989    "CABG X3" 96/02/2013)  . Cardiac defibrillator placement  02/1989-12/2011    "I've had a total of 6 or 7 defibrillators put in" (04/24/2013)  . Finger fracture surgery Left ~ 2000    "pointer" (04/24/2013)    Allergies/Intolerances No Known Allergies  Current Home Medications Current Outpatient Prescriptions  Medication Sig Dispense Refill  . aspirin (ASPIR-81) 81 MG EC tablet Take 81 mg by mouth at bedtime.       . carvedilol (COREG) 6.25 MG tablet Take 1 tablet (6.25 mg total) by mouth 2 (two) times daily.  180 tablet  3  . clopidogrel (PLAVIX) 75 MG tablet Take 1 tablet (75 mg total) by mouth daily  with breakfast.  30 tablet  11  . etodolac (LODINE) 400 MG tablet Take 400 mg by mouth at bedtime.       . furosemide (LASIX) 20 MG tablet Take 20 mg by mouth daily.        Marland Kitchen HYDROcodone-acetaminophen (LORCET) 5-325 MG per tablet Take 1 tablet by mouth every 6 (six) hours as needed for pain.      . Insulin Detemir (LEVEMIR [place]) Inject 40 Units into the skin daily.      Marland Kitchen lisinopril (PRINIVIL,ZESTRIL) 2.5 MG tablet Take 1 tablet (2.5 mg total) by mouth daily.  30 tablet  4    . metaxalone (SKELAXIN) 800 MG tablet Take 800 mg by mouth 2 (two) times daily.        . metFORMIN (GLUCOPHAGE) 1000 MG tablet Take 1,000 mg by mouth 2 (two) times daily with a meal.      . potassium chloride (KLOR-CON) 20 MEQ packet Take 20 mEq by mouth every other day.        . pravastatin (PRAVACHOL) 40 MG tablet Take 40 mg by mouth at bedtime.       . pregabalin (LYRICA) 75 MG capsule Take 150 mg by mouth daily.       . vitamin B-12 (CYANOCOBALAMIN) 1000 MCG tablet Take 1,000 mcg by mouth daily.       No current facility-administered medications for this visit.   Social History History   Social History  . Marital Status: Married    Spouse Name: N/A    Number of Children: N/A  . Years of Education: N/A   Occupational History  . Not on file.   Social History Main Topics  . Smoking status: Never Smoker   . Smokeless tobacco: Never Used  . Alcohol Use: Yes     Comment: 04/24/2013 "wine w/dinner 15-20 yr ago"  . Drug Use: No  . Sexually Active: Not on file   Other Topics Concern  . 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     Review of Systems General: No chills, fever, night sweats or weight changes Cardiovascular: No chest pain, dyspnea on exertion, edema, orthopnea, palpitations, paroxysmal nocturnal dyspnea Dermatological: No rash, lesions or masses Respiratory: No cough, dyspnea Urologic: No hematuria, dysuria Abdominal: No nausea, vomiting, diarrhea, bright red blood per rectum, melena, or hematemesis Neurologic: No visual changes, weakness, changes in mental status All other systems reviewed and are otherwise negative except as noted above.  Physical Exam Vitals: Blood pressure 130/59, pulse 46, height 5\' 8"  (1.727 m), weight 178 lb 12.8 oz (81.103 kg).  General: Well developed, well appearing 77 y.o. male in  no acute distress. HEENT: Normocephalic, atraumatic. EOMs intact. Sclera nonicteric. Oropharynx clear.  Neck: Supple. No JVD. Lungs: Respirations regular and unlabored, CTA bilaterally. No wheezes, rales or rhonchi. Heart: Bradycardic. S1, S2 present. No murmur, rub, S3 or S4. Abdomen: Soft, non-tender, non-distended. BS present x 4 quadrants. No hepatosplenomegaly.  Extremities: No clubbing, cyanosis or edema. DP/PT/Radials 2+ and equal bilaterally. Psych: Normal affect. Neuro: Alert and oriented X 3. Moves all extremities spontaneously.   Diagnostics Device interrogation today - quick look check - 1 NSVT episode, lasting 9 seconds otherwise no ventricular arrhythmias; V paced <1% of time  Assessment and Plan  1. CAD s/p recent PCI  Continue ASA and  Plavix x 12 months  Continue BB, ACEI and statin  Continue aggressive secondary prevention/risk factor modification with tight BP and blood sugar control in addition to low fat, low cholesterol diet  2. Sinus bradycardia Most likely symptomatic with DOE and lightheadedness Down-titrate Coreg to 3.125 mg twice daily 3. Ischemic CM with chronic systolic HF  Down-titrate Coreg to 3.125 mg twice daily as outlined above Continue lisinopril 2.5 mg once daily  Euvolemic by exam today  3. Paroxysmal VT  Stable  Asymptomatic  Continue BB but will down-titrate as outlined above  Signed, Kortny Lirette, PA-C 05/28/2013, 1:55 PM

## 2013-05-28 ENCOUNTER — Ambulatory Visit (INDEPENDENT_AMBULATORY_CARE_PROVIDER_SITE_OTHER): Payer: Medicare Other | Admitting: Cardiology

## 2013-05-28 ENCOUNTER — Encounter: Payer: Self-pay | Admitting: Cardiology

## 2013-05-28 VITALS — BP 130/59 | HR 46 | Ht 68.0 in | Wt 178.8 lb

## 2013-05-28 DIAGNOSIS — I472 Ventricular tachycardia: Secondary | ICD-10-CM

## 2013-05-28 DIAGNOSIS — I2510 Atherosclerotic heart disease of native coronary artery without angina pectoris: Secondary | ICD-10-CM

## 2013-05-28 DIAGNOSIS — R0989 Other specified symptoms and signs involving the circulatory and respiratory systems: Secondary | ICD-10-CM | POA: Diagnosis not present

## 2013-05-28 DIAGNOSIS — I2589 Other forms of chronic ischemic heart disease: Secondary | ICD-10-CM

## 2013-05-28 DIAGNOSIS — Z9861 Coronary angioplasty status: Secondary | ICD-10-CM

## 2013-05-28 DIAGNOSIS — I255 Ischemic cardiomyopathy: Secondary | ICD-10-CM

## 2013-05-28 DIAGNOSIS — R0609 Other forms of dyspnea: Secondary | ICD-10-CM | POA: Diagnosis not present

## 2013-05-28 DIAGNOSIS — I498 Other specified cardiac arrhythmias: Secondary | ICD-10-CM

## 2013-05-28 DIAGNOSIS — Z955 Presence of coronary angioplasty implant and graft: Secondary | ICD-10-CM

## 2013-05-28 DIAGNOSIS — Z95810 Presence of automatic (implantable) cardiac defibrillator: Secondary | ICD-10-CM

## 2013-05-28 DIAGNOSIS — R001 Bradycardia, unspecified: Secondary | ICD-10-CM

## 2013-05-28 MED ORDER — CARVEDILOL 3.125 MG PO TABS: 3.1250 mg | ORAL_TABLET | Freq: Two times a day (BID) | ORAL | Status: DC

## 2013-05-28 NOTE — Patient Instructions (Addendum)
Your physician has recommended you make the following change in your medication:   DECREASE COREG TO 3.125 MG TWICE DAILY (TWELVE HOURS APART)  Your physician recommends that you schedule a follow-up appointment in: 06-06-2013 @ 12 PM

## 2013-05-31 ENCOUNTER — Observation Stay (HOSPITAL_COMMUNITY)
Admission: EM | Admit: 2013-05-31 | Discharge: 2013-06-01 | Disposition: A | Discharge status: Home / Self Care | Payer: Medicare Other | Attending: Cardiology | Admitting: Cardiology

## 2013-05-31 ENCOUNTER — Telehealth: Payer: Self-pay | Admitting: Internal Medicine

## 2013-05-31 ENCOUNTER — Encounter (HOSPITAL_COMMUNITY): Payer: Self-pay

## 2013-05-31 ENCOUNTER — Emergency Department (HOSPITAL_COMMUNITY): Payer: Medicare Other

## 2013-05-31 DIAGNOSIS — I4729 Other ventricular tachycardia: Secondary | ICD-10-CM | POA: Insufficient documentation

## 2013-05-31 DIAGNOSIS — I472 Ventricular tachycardia, unspecified: Secondary | ICD-10-CM | POA: Insufficient documentation

## 2013-05-31 DIAGNOSIS — R079 Chest pain, unspecified: Secondary | ICD-10-CM | POA: Diagnosis not present

## 2013-05-31 DIAGNOSIS — Z95810 Presence of automatic (implantable) cardiac defibrillator: Secondary | ICD-10-CM | POA: Diagnosis not present

## 2013-05-31 DIAGNOSIS — E119 Type 2 diabetes mellitus without complications: Secondary | ICD-10-CM | POA: Insufficient documentation

## 2013-05-31 DIAGNOSIS — I5022 Chronic systolic (congestive) heart failure: Secondary | ICD-10-CM | POA: Diagnosis not present

## 2013-05-31 DIAGNOSIS — R0602 Shortness of breath: Secondary | ICD-10-CM | POA: Diagnosis not present

## 2013-05-31 DIAGNOSIS — Z79899 Other long term (current) drug therapy: Secondary | ICD-10-CM | POA: Insufficient documentation

## 2013-05-31 DIAGNOSIS — I2510 Atherosclerotic heart disease of native coronary artery without angina pectoris: Secondary | ICD-10-CM | POA: Diagnosis not present

## 2013-05-31 DIAGNOSIS — I2589 Other forms of chronic ischemic heart disease: Secondary | ICD-10-CM | POA: Diagnosis not present

## 2013-05-31 DIAGNOSIS — I509 Heart failure, unspecified: Secondary | ICD-10-CM | POA: Diagnosis not present

## 2013-05-31 DIAGNOSIS — Z951 Presence of aortocoronary bypass graft: Secondary | ICD-10-CM | POA: Diagnosis not present

## 2013-05-31 DIAGNOSIS — I6529 Occlusion and stenosis of unspecified carotid artery: Secondary | ICD-10-CM | POA: Diagnosis not present

## 2013-05-31 HISTORY — DX: Atherosclerotic heart disease of native coronary artery without angina pectoris: I25.10

## 2013-05-31 LAB — CBC WITH DIFFERENTIAL/PLATELET
Eosinophils Relative: 3 % (ref 0–5)
HCT: 36.9 % — ABNORMAL LOW (ref 39.0–52.0)
Hemoglobin: 12 g/dL — ABNORMAL LOW (ref 13.0–17.0)
Lymphocytes Relative: 29 % (ref 12–46)
MCHC: 32.5 g/dL (ref 30.0–36.0)
MCV: 88.1 fL (ref 78.0–100.0)
Monocytes Absolute: 0.5 10*3/uL (ref 0.1–1.0)
Monocytes Relative: 7 % (ref 3–12)
Neutro Abs: 4 10*3/uL (ref 1.7–7.7)
WBC: 6.7 10*3/uL (ref 4.0–10.5)

## 2013-05-31 LAB — GLUCOSE, CAPILLARY

## 2013-05-31 LAB — BASIC METABOLIC PANEL
BUN: 22 mg/dL (ref 6–23)
CO2: 28 mEq/L (ref 19–32)
Chloride: 103 mEq/L (ref 96–112)
Creatinine, Ser: 0.89 mg/dL (ref 0.50–1.35)

## 2013-05-31 LAB — TROPONIN I: Troponin I: <0.3 ng/mL (ref <0.30)

## 2013-05-31 MED ORDER — CLOPIDOGREL BISULFATE 75 MG PO TABS
75.0000 mg | ORAL_TABLET | Freq: Every day | ORAL | Status: DC
  Administered 2013-06-01: 75 mg via ORAL
  Filled 2013-05-31 (×2): qty 1

## 2013-05-31 MED ORDER — ACETAMINOPHEN 325 MG PO TABS: 650.0000 mg | ORAL_TABLET | ORAL | Status: DC | PRN

## 2013-05-31 MED ORDER — ASPIRIN 81 MG PO TBEC: 81.0000 mg | DELAYED_RELEASE_TABLET | Freq: Every day | ORAL | Status: DC

## 2013-05-31 MED ORDER — ONDANSETRON HCL 4 MG/2ML IJ SOLN: 4.0000 mg | Freq: Four times a day (QID) | INTRAMUSCULAR | Status: DC | PRN

## 2013-05-31 MED ORDER — PREGABALIN 25 MG PO CAPS
150.0000 mg | ORAL_CAPSULE | Freq: Every day | ORAL | Status: DC
  Administered 2013-05-31 – 2013-06-01 (×2): 150 mg via ORAL
  Filled 2013-05-31: qty 6

## 2013-05-31 MED ORDER — VITAMIN B-12 1000 MCG PO TABS
1000.0000 ug | ORAL_TABLET | Freq: Every day | ORAL | Status: DC
  Administered 2013-06-01: 1000 ug via ORAL
  Filled 2013-05-31: qty 1

## 2013-05-31 MED ORDER — ASPIRIN EC 81 MG PO TBEC
81.0000 mg | DELAYED_RELEASE_TABLET | Freq: Every day | ORAL | Status: DC
  Filled 2013-05-31: qty 1

## 2013-05-31 MED ORDER — METAXALONE 800 MG PO TABS
800.0000 mg | ORAL_TABLET | Freq: Two times a day (BID) | ORAL | Status: DC
  Administered 2013-05-31 – 2013-06-01 (×2): 800 mg via ORAL
  Filled 2013-05-31 (×3): qty 1

## 2013-05-31 MED ORDER — NITROGLYCERIN 0.4 MG SL SUBL: 0.4000 mg | SUBLINGUAL_TABLET | SUBLINGUAL | Status: DC | PRN

## 2013-05-31 MED ORDER — HYDROCODONE-ACETAMINOPHEN 5-325 MG PO TABS
1.0000 | ORAL_TABLET | Freq: Four times a day (QID) | ORAL | Status: DC | PRN
  Administered 2013-05-31 – 2013-06-01 (×3): 1 via ORAL
  Filled 2013-05-31 (×3): qty 1

## 2013-05-31 MED ORDER — SIMVASTATIN 20 MG PO TABS
20.0000 mg | ORAL_TABLET | Freq: Every day | ORAL | Status: DC
  Administered 2013-05-31: 20 mg via ORAL
  Filled 2013-05-31 (×2): qty 1

## 2013-05-31 MED ORDER — INSULIN DETEMIR 100 UNIT/ML ~~LOC~~ SOLN
30.0000 [IU] | Freq: Every day | SUBCUTANEOUS | Status: DC
  Administered 2013-06-01: 30 [IU] via SUBCUTANEOUS
  Filled 2013-05-31: qty 0.3

## 2013-05-31 MED ORDER — INSULIN ASPART 100 UNIT/ML ~~LOC~~ SOLN
0.0000 [IU] | Freq: Three times a day (TID) | SUBCUTANEOUS | Status: DC
  Administered 2013-05-31 – 2013-06-01 (×2): 2 [IU] via SUBCUTANEOUS

## 2013-05-31 MED ORDER — POTASSIUM CHLORIDE 20 MEQ PO PACK
20.0000 meq | PACK | ORAL | Status: DC
  Filled 2013-05-31: qty 1

## 2013-05-31 MED ORDER — LISINOPRIL 2.5 MG PO TABS
2.5000 mg | ORAL_TABLET | Freq: Every day | ORAL | Status: DC
  Administered 2013-06-01: 2.5 mg via ORAL
  Filled 2013-05-31: qty 1

## 2013-05-31 MED ORDER — FUROSEMIDE 40 MG PO TABS
40.0000 mg | ORAL_TABLET | Freq: Every day | ORAL | Status: DC
  Administered 2013-06-01: 40 mg via ORAL
  Filled 2013-05-31: qty 1

## 2013-05-31 MED ORDER — METFORMIN HCL 500 MG PO TABS
1000.0000 mg | ORAL_TABLET | Freq: Two times a day (BID) | ORAL | Status: DC
  Administered 2013-05-31 – 2013-06-01 (×2): 1000 mg via ORAL
  Filled 2013-05-31 (×4): qty 2

## 2013-05-31 MED ORDER — CLOPIDOGREL BISULFATE 75 MG PO TABS: 75.0000 mg | ORAL_TABLET | Freq: Every day | ORAL | Status: DC

## 2013-05-31 NOTE — ED Provider Notes (Signed)
History    CSN: [id number] Arrival date & time 05/31/13  1058  First MD Initiated Contact with Patient 05/31/13 1112     Chief Complaint  Patient presents with  . Shortness of Breath   (Consider location/radiation/quality/duration/timing/severity/associated sxs/prior Treatment) HPI Comments: Patient is a 77 year old male with a past medical history of previous MI, CHF, diabetes, ischemic cardiomyoathy who presents with SOB since 4:00 this morning that woke him from sleep. Patient reports sudden onset and constant symptoms. No aggravating/alleviating factors. Patient also reports an episode of chest pain that occurred last night while at rest. The pain started suddenly and was located in the left chest without radiation. The pain was sharp and severe. The pain lasted a few minutes before spontaneously resolving. Patient states associated intermittent nausea, diaphoresis and dizziness for the past month. Patient reports starting a beta blocker 1 month ago which he thinks may be causing the symptoms. He is a patient of  Cardiology and contacted the office prior to coming to the ED.    Past Medical History  Diagnosis Date  . Arthropathy, unspecified, site unspecified   . Occlusion and stenosis of carotid artery without mention of cerebral infarction   . Raynaud's syndrome   . Congestive heart failure, unspecified   . Paroxysmal ventricular tachycardia   . Other specified forms of chronic ischemic heart disease   . Peripheral vascular disease   . Cardiomyopathy, ischemic   . Automatic implantable cardioverter-defibrillator in situ   . High cholesterol   . Myocardial infarction 1990    "4-5 while in [facility] til they put in ICD" (04/24/2013)  . Shortness of breath     "both exertion and lying down" (04/24/2013)  . Type II or unspecified type diabetes mellitus without mention of complication, not stated as uncontrolled   . GERD (gastroesophageal reflux disease)   . Osteoarthrosis,  unspecified whether generalized or localized, unspecified site   . Degenerative joint disease   . Arthritis     "back and hands" (04/24/2013)  . Chronic lower back pain    Past Surgical History  Procedure Laterality Date  . Anal fistulectomy  2000's  . Cataract extraction, bilateral Bilateral ~ 2009  . Coronary artery bypass graft  01/1989    "CABG X3" 96/02/2013)  . Cardiac defibrillator placement  02/1989-12/2011    "I've had a total of 6 or 7 defibrillators put in" (04/24/2013)  . Finger fracture surgery Left ~ 2000    "pointer" (04/24/2013)   Family History  Problem Relation Age of Onset  . Heart failure Mother 57  . Heart attack Father   . Leukemia Sister   . Stomach cancer Maternal Grandfather    History  Substance Use Topics  . Smoking status: Never Smoker   . Smokeless tobacco: Never Used  . Alcohol Use: Yes     Comment: 04/24/2013 "wine w/dinner 15-20 yr ago"    Review of Systems  Constitutional: Positive for fatigue.  Respiratory: Positive for shortness of breath.   Cardiovascular: Positive for chest pain.  All other systems reviewed and are negative.    Allergies  Review of patient's allergies indicates no known allergies.  Home Medications   Current Outpatient Rx  Name  Route  Sig  Dispense  Refill  . aspirin (ASPIR-81) 81 MG EC tablet   Oral   Take 81 mg by mouth at bedtime.          . carvedilol (COREG) 3.125 MG tablet   Oral  Take 1 tablet (3.125 mg total) by mouth 2 (two) times daily.   60 tablet   4   . clopidogrel (PLAVIX) 75 MG tablet   Oral   Take 1 tablet (75 mg total) by mouth daily with breakfast.   30 tablet   11   . etodolac (LODINE) 400 MG tablet   Oral   Take 400 mg by mouth at bedtime.          . furosemide (LASIX) 20 MG tablet   Oral   Take 20 mg by mouth daily.           Marland Kitchen HYDROcodone-acetaminophen (LORCET) 5-325 MG per tablet   Oral   Take 1 tablet by mouth every 6 (six) hours as needed for pain.         . Insulin  Detemir (LEVEMIR [place])   Subcutaneous   Inject 40 Units into the skin daily.         Marland Kitchen lisinopril (PRINIVIL,ZESTRIL) 2.5 MG tablet   Oral   Take 1 tablet (2.5 mg total) by mouth daily.   30 tablet   4   . metaxalone (SKELAXIN) 800 MG tablet   Oral   Take 800 mg by mouth 2 (two) times daily.           . metFORMIN (GLUCOPHAGE) 1000 MG tablet   Oral   Take 1,000 mg by mouth 2 (two) times daily with a meal.         . potassium chloride (KLOR-CON) 20 MEQ packet   Oral   Take 20 mEq by mouth every other day.           . pravastatin (PRAVACHOL) 40 MG tablet   Oral   Take 40 mg by mouth at bedtime.          . pregabalin (LYRICA) 75 MG capsule   Oral   Take 150 mg by mouth daily.          . vitamin B-12 (CYANOCOBALAMIN) 1000 MCG tablet   Oral   Take 1,000 mcg by mouth daily.          BP 122/42  Pulse 60  Temp(Src) 97.5 F (36.4 C) (Oral)  Resp 20  SpO2 97% Physical Exam  Nursing note and vitals reviewed. Constitutional: He is oriented to person, place, and time. He appears well-developed and well-nourished. No distress.  HENT:  Head: Normocephalic and atraumatic.  Eyes: Conjunctivae and EOM are normal.  Neck: Normal range of motion.  Cardiovascular: Normal rate and regular rhythm.  Exam reveals no gallop and no friction rub.   No murmur heard. Pulmonary/Chest: Effort normal and breath sounds normal. He has no wheezes. He has no rales. He exhibits no tenderness.  Abdominal: Soft. He exhibits no distension. There is no tenderness. There is no rebound and no guarding.  Musculoskeletal: Normal range of motion.  Neurological: He is alert and oriented to person, place, and time. Coordination normal.  Speech is goal-oriented. Moves limbs without ataxia.   Skin: Skin is warm and dry.  Psychiatric: He has a normal mood and affect. His behavior is normal.    ED Course  Procedures (including critical care time)   Date: 05/31/2013  Rate: 66  Rhythm: sinus  arrhythmia  QRS Axis: normal  Intervals: PR prolonged  ST/T Wave abnormalities: normal  Conduction Disutrbances:first-degree A-V block   Narrative Interpretation: sinus arrythmia with 1st degree AV block unchanged from previous  Old EKG Reviewed: unchanged   Labs Reviewed  CBC  WITH DIFFERENTIAL - Abnormal; Notable for the following:    RBC 4.19 (*)    Hemoglobin 12.0 (*)    HCT 36.9 (*)    Platelets 131 (*)    All other components within normal limits  BASIC METABOLIC PANEL - Abnormal; Notable for the following:    Glucose, Bld 129 (*)    GFR calc non Af Amer 82 (*)    All other components within normal limits  PRO B NATRIURETIC PEPTIDE - Abnormal; Notable for the following:    Pro B Natriuretic peptide (BNP) 2692.0 (*)    All other components within normal limits  GLUCOSE, CAPILLARY - Abnormal; Notable for the following:    Glucose-Capillary 128 (*)    All other components within normal limits  TROPONIN I  TROPONIN I  TROPONIN I  BASIC METABOLIC PANEL  POCT I-STAT TROPONIN I   Dg Chest 2 View  05/31/2013   *RADIOLOGY REPORT*  Clinical Data: Short of breath, chest pain, sweating  CHEST - 2 VIEW  Comparison: Chest x-ray of 04/24/2013  Findings: No active infiltrate or effusion is seen.  Cardiomegaly is stable and defibrillator leads remain.  There are mild degenerative changes in the thoracic spine.  IMPRESSION: Stable cardiomegaly.  Defibrillator leads remain.  No active lung disease.   Original Report Authenticated By: Dwyane Dee, M.D.   1. Acute on chronic heart failure     MDM  11:14 AM Labs pending. Vitals stable and patient afebrile.   [facility] will see the patient for admission.    Emilia Beck, [place] 05/31/13 1907

## 2013-05-31 NOTE — Progress Notes (Signed)
Pt arrived from ED alert/oriented has ICD check done by [facility] rep. VS done and placed on telemetry monitor.

## 2013-05-31 NOTE — Telephone Encounter (Signed)
Pt states that he is currently having "dry heaves" , is sweating and is SOB. Pt states that he was having CP late last night but not at this time. Pt is advised to call 911 or have wife drive him ER. He verbalized understanding and states his wife can get him to ER sooner than ambulance.

## 2013-05-31 NOTE — Telephone Encounter (Signed)
New problem  Pt wife states that he was having chest pain last night but not currently having any chest pain this morning. As of this morning he was sweating and vomiting really bad, hasn't vomited in the last 30 mins.  York Spaniel that he is having a real hard time catching his breath at times.

## 2013-05-31 NOTE — ED Provider Notes (Signed)
Medical screening examination/treatment/procedure(s) were conducted as a shared visit with non-physician practitioner(s) and myself.  I personally evaluated the patient during the encounter  Patient followed by [facility] cardiology. Patient has a defibrillator pacemaker in place. Patient has a history of congestive heart failure. Patient recently about one month ago had a stent placed. Patient seen one week ago by cardiology they are working with his beta blockers to try to take care of his main complaint of shortness of breath and fatigue also gets episodes of diaphoresis brief periods of chest pain but no gross significant change in pattern. Patient called cardiology this morning he was told to come in for them to evaluate him in the emergency department. Once we get basic labs back we will call [facility] cardiology for consult. Patient currently in no acute distress.  Patient's EKG without any acute changes. EKG was reviewed by me.  Shelda Jakes, MD 05/31/13 1239

## 2013-05-31 NOTE — H&P (Signed)
CARDIOLOGY ADMISSION HISTORY & PHYSICAL  Patient ID: Victor [name] MRN: [id number], DOB/AGE: [birth year]   Date of Admission: 05/31/2013  Primary Physician: Ailene Ravel, MD Primary Cardiologist: Lewayne Bunting, MD Reason for Admission: Chest pain  History of Present Illness Victor [name] is a 77 year old man with an ischemic CM, chronic systolic HF, CAD s/p CABG 1990 and sustained VT and syncope s/p ICD implant who was admitted to [facility] on 04/24/2013 with acute on chronic systolic HF and [place]. CEs negative. He was diuresed with IV Lasix with improved SOB and orthopnea. He then underwent a right and left heart catheterization on 04/26/2013 with details as outlined below. He is now s/p PCI of SVG to OM-3. He was continued on medical therapy with the addition of Brilinta. However, due to an episode of acute dyspnea after his procedure, Dr. Excell Seltzer elected to discontinue Brilinta in favor of Plavix which he will continue for at least 12 months. Of note, he was enrolled in the [facility] research trial. He was initiated on appropriate medical therapy for HF including lisinopril and carvedilol.   Since discharge, he has been followed in the office closely for up-titration of his HF regimen. On 05/07/2013 his Coreg dose was increased to 6.25 mg twice daily. He was then seen in follow-up on 05/28/2013 at which time he was found to have significant bradycardia with rate of 42 bpm. He reported DOE, fatigue, exercise intolerance and feeling "not like himself." His carvedilol was down-titrated to 3.125 mg twice daily. He has taken this dose for 2 days without much improvement in his symptoms. In addition, he has developed chest pain, dizziness, intermittent disorientation (walking in his yard and suddenly can't remember whether or not he was walking to his barn or the house), visual changes ("can't focus") and diminished appetite. He denies syncope. He denies LE swelling, orthopnea, PND or recent weight gain. Mr.  [name] states that he is compliant and tolerating medications without difficulty. He tells me his PCP has made multiple changes to his insulin regimen recently and he is concerned some of his symptoms may be related to low blood sugar levels although he has not measured his blood glucose when his symptoms occur.    Of note, Victor [name] has h/o sustained VT with syncope and easily inducible VT by EPS. He underwent initial ICD insertion in 1990 with epicardial patches placed at that time. His initial system utilized two epicardial-0040 patches, serial numbers [id number] and serial numbers [id number]. His epicardial pacing leads were epicardial screws model 4312, serial number [id number] and [id number]. He underwent ICD generator change in 2004. His ICD battery was then found to be at ERI prematurely and in 2005 he underwent removal of a previously implanted ICD with capping of the old epicardial leads and patches, followed by insertion of a new transvenous single-chamber defibrillator by way of the left subclavian vein. 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  Past Medical History Past Medical History  Diagnosis Date  . Arthropathy, unspecified, site unspecified   . Occlusion and stenosis of carotid artery without mention of cerebral infarction   . Raynaud's syndrome   . Congestive heart failure, unspecified   . Paroxysmal ventricular tachycardia   . Other specified forms of chronic ischemic heart disease   . Peripheral vascular disease   . Cardiomyopathy, ischemic   . Automatic implantable cardioverter-defibrillator in situ   . High cholesterol   . Myocardial infarction 1990    "4-5 while in [facility]  til they put in ICD" (04/24/2013)  . Shortness of breath     "both exertion and lying down" (04/24/2013)  . Type II or unspecified type diabetes mellitus without mention of complication, not stated as uncontrolled   . GERD (gastroesophageal reflux disease)   . Osteoarthrosis, unspecified  whether generalized or localized, unspecified site   . Degenerative joint disease   . Arthritis     "back and hands" (04/24/2013)  . Chronic lower back pain     Past Surgical History Past Surgical History  Procedure Laterality Date  . Anal fistulectomy  2000's  . Cataract extraction, bilateral Bilateral ~ 2009  . Coronary artery bypass graft  01/1989    "CABG X3" 96/02/2013)  . Cardiac defibrillator placement  02/1989-12/2011    "I've had a total of 6 or 7 defibrillators put in" (04/24/2013)  . Finger fracture surgery Left ~ 2000    "pointer" (04/24/2013)    Allergies/Intolerances No Known Allergies  Home Medications   Medication List    ASK your doctor about these medications       ASPIR-81 81 MG EC tablet  Generic drug:  aspirin  Take 81 mg by mouth at bedtime.     carvedilol 3.125 MG tablet  Commonly known as:  COREG  Take 1.56 mg by mouth 2 (two) times daily with a meal.     clopidogrel 75 MG tablet  Commonly known as:  PLAVIX  Take 1 tablet (75 mg total) by mouth daily with breakfast.     etodolac 400 MG tablet  Commonly known as:  LODINE  Take 400 mg by mouth at bedtime.     furosemide 20 MG tablet  Commonly known as:  LASIX  Take 20 mg by mouth every morning.     insulin detemir 100 UNIT/ML injection  Commonly known as:  LEVEMIR  Inject 30 Units into the skin at bedtime.     lisinopril 2.5 MG tablet  Commonly known as:  PRINIVIL,ZESTRIL  Take 1 tablet (2.5 mg total) by mouth daily.     LORCET 5-325 MG per tablet  Generic drug:  HYDROcodone-acetaminophen  Take 1 tablet by mouth every 6 (six) hours as needed for pain.     LYRICA 75 MG capsule  Generic drug:  pregabalin  Take 150 mg by mouth daily.     metFORMIN 1000 MG tablet  Commonly known as:  GLUCOPHAGE  Take 1,000 mg by mouth 2 (two) times daily with a meal.     potassium chloride 20 MEQ packet  Commonly known as:  KLOR-CON  Take 20 mEq by mouth every other day.     pravastatin 40 MG tablet    Commonly known as:  PRAVACHOL  Take 40 mg by mouth at bedtime.     SKELAXIN 800 MG tablet  Generic drug:  metaxalone  Take 800 mg by mouth 2 (two) times daily.     vitamin B-12 1000 MCG tablet  Commonly known as:  CYANOCOBALAMIN  Take 1,000 mcg by mouth daily.       Family History Family History  Problem Relation Age of Onset  . Heart failure Mother 73  . Heart attack Father   . Leukemia Sister   . Stomach cancer Maternal Grandfather     Social History History   Social History  . Marital Status: Married    Spouse Name: N/A    Number of Children: N/A  . Years of Education: N/A   Occupational History  . Not on  file.   Social History Main Topics  . Smoking status: Never Smoker   . Smokeless tobacco: Never Used  . Alcohol Use: Yes     Comment: 04/24/2013 "wine w/dinner 15-20 yr ago"  . Drug Use: No  . Sexually Active: Not on file   Other Topics Concern  . 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     Review of Systems General: No chills, fever, night sweats or weight changes.  Cardiovascular: No chest pain, dyspnea on exertion, edema, orthopnea, palpitations, paroxysmal nocturnal dyspnea. Dermatological: No rash, lesions or masses. Respiratory: No cough, dyspnea. Urologic: No hematuria, dysuria. Abdominal: No nausea, vomiting, diarrhea, bright red blood per rectum, melena, or hematemesis. Neurologic: No visual changes, weakness, changes in mental status. All other systems reviewed and are otherwise negative except as noted above.  Physical Exam Vitals: Blood pressure 138/48, pulse 60, temperature 97.5 F (36.4 C), temperature source Oral, resp. rate 18, SpO2 97.00%.  General: Well developed, anxious appearing 77 y.o. male in no acute distress. HEENT: Normocephalic, atraumatic. EOMs intact. Sclera nonicteric.  Oropharynx clear.  Neck: Supple without bruits. No JVD. Lungs: Respirations regular and unlabored, CTA bilaterally. No wheezes, rales or rhonchi. Heart: RRR. S1, S2 present. No murmurs, rub, S3 or S4. Abdomen: Soft, non-tender, non-distended. BS present x 4 quadrants. No hepatosplenomegaly.  Extremities: No clubbing, cyanosis or edema. DP/PT/Radials 2+ and equal bilaterally. Psych: Normal affect. Neuro: Alert and oriented X 3. Moves all extremities spontaneously. Musculoskeletal: No kyphosis. Skin: Intact. Warm and dry. No rashes or petechiae in exposed areas.   Labs  Troponin 0.02 ProBNP 2692  Lab Results  Component Value Date   WBC 6.7 05/31/2013   HGB 12.0* 05/31/2013   HCT 36.9* 05/31/2013   MCV 88.1 05/31/2013   PLT 131* 05/31/2013    Recent Labs Lab 05/31/13 1212  NA 143  K 4.2  CL 103  CO2 28  BUN 22  CREATININE 0.89  CALCIUM 10.0  GLUCOSE 129*    Radiology/Studies Dg Chest 2 View  05/31/2013   *RADIOLOGY REPORT*  Clinical Data: Short of breath, chest pain, sweating  CHEST - 2 VIEW  Comparison: Chest x-ray of 04/24/2013  Findings: No active infiltrate or effusion is seen.  Cardiomegaly is stable and defibrillator leads remain.  There are mild degenerative changes in the thoracic spine.  IMPRESSION: Stable cardiomegaly.  Defibrillator leads remain.  No active lung disease.   Original Report Authenticated By: Dwyane Dee, M.D.    Echocardiogram  Study Conclusions - Left ventricle: Extremely poor acoustic windows limit study. Overal LV systolic function appears depress, at least moderately; some images appear severe. Inferior and posterior walls appear severely hypokinetic to akinetic. The cavity size was normal. Wall thickness was normal. Doppler parameters are consistent with abnormal left ventricular relaxation (grade 1 diastolic dysfunction). - Mitral valve: Mild regurgitation. - Left atrium: The atrium was moderately dilated.   12-lead ECG here in the ED shows SR  with first degree AV block at 66 bpm; no ST-T wave abnormalities Telemetry shows SR in 60s   Assessment and Plan 1. Chest pain a/w SOB, dizziness, fatigue, anxiety, disorientation, blurred vision and decreased appetite 2. CAD s/p recent PCI of SVG to OM-3 3. Ischemic CM with chronic systolic HF, ICD in place 4. Paroxysmal VT 5. DM  Mr. Nijjar presents  with a multitude of symptoms that have been progressively worsening since hospital discharge. Some of them he has not reported in follow-up until now stating he "didn't want to tell anyone because he didn't want to go to the hospital." However, in the last 24 hours he has had 2 episodes of chest pain which he describes as "achy" pain in the center of his chest which is nonexertional, different than his previous angina. He has several accompanying symptoms including SOB and dizziness. In addition, he notes persistent fatigue, intermittent disorientation/confusion and blurred vision. He denies LE swelling, orthopnea or PND. He denies syncope. He has not received any ICD shocks. We will interrogate his device while here. His symptoms may be related to carvedilol which, other than Plavix, is the only new medication he is taking since discharge last month. We will stop this medication altogether and follow telemetry. His CP is atypical for angina and his 12-lead ECG shows NSR without ST-T wave abnormalities. However, given his history and recent PCI, we will cycle CEs to rule out MI. Further recommendations pending his clinical course.  Dr. Patty Sermons was in to interview and examine the patient with me. Please see his recommendations below.  Signed, Rick Duff, PA-C 05/31/2013, 1:30 PM The patient was seen in the CDU with Rick Duff, PA-C.  He has had some atypical chest pain.  His electrocardiogram does not show any new ischemic changes.  His initial point-of-care troponin is normal.  He has also complained of increasing shortness of breath.  He  has been more anxious and confused recently.  Review of his remote chart reveals that in the past he was unable to tolerate metoprolol which was subsequently discontinued.  He may be having a similar response to carvedilol.  I suspect that some of his recent symptoms are related to the carvedilol which was started at the time of his recent PCI in June 2014.  He did not tolerate attempts at up titration of his carvedilol recently and his dose was reduced back to 3.125 mg twice a day 3 days ago.  However he has continued to feel bad.  His chest x-ray was reviewed and shows cardiomegaly but no significant pulmonary vascular congestion.  His B. natruretic peptide is mildly increased compared to his previous values.  We will plan to admit him to observation, stop his carvedilol altogether at this point and up titrate his furosemide.  Agree with assessment and plan as noted above.

## 2013-05-31 NOTE — Progress Notes (Signed)
Utilization Review Completed.   Faylynn Stamos, RN, BSN Nurse Case Manager  [phone]  

## 2013-05-31 NOTE — ED Notes (Signed)
Pt presents with sudden onset of shortness of breath that woke him from sleep this morning at 0400.  Pt reports he has been short of breath since discharge from here x 1 month ago that worsened this morning.  Pt reports dizziness with near syncope yesterday, nearly falling down stairs.  +nausea and vomiting.

## 2013-06-01 DIAGNOSIS — I509 Heart failure, unspecified: Secondary | ICD-10-CM | POA: Diagnosis not present

## 2013-06-01 LAB — GLUCOSE, CAPILLARY
Glucose-Capillary: 112 mg/dL — ABNORMAL HIGH (ref 70–99)
Glucose-Capillary: 149 mg/dL — ABNORMAL HIGH (ref 70–99)
Glucose-Capillary: 195 mg/dL — ABNORMAL HIGH (ref 70–99)

## 2013-06-01 LAB — TROPONIN I: Troponin I: <0.3 ng/mL (ref <0.30)

## 2013-06-01 LAB — BASIC METABOLIC PANEL
BUN: 21 mg/dL (ref 6–23)
Chloride: 104 mEq/L (ref 96–112)
Glucose, Bld: 123 mg/dL — ABNORMAL HIGH (ref 70–99)
Potassium: 4.2 mEq/L (ref 3.5–5.1)
Sodium: 139 mEq/L (ref 135–145)

## 2013-06-01 MED ORDER — POTASSIUM CHLORIDE CRYS ER 10 MEQ PO TBCR
20.0000 meq | EXTENDED_RELEASE_TABLET | ORAL | Status: DC
  Administered 2013-06-01: 20 meq via ORAL
  Filled 2013-06-01: qty 2

## 2013-06-01 MED ORDER — NITROGLYCERIN 0.4 MG SL SUBL: 0.4000 mg | SUBLINGUAL_TABLET | SUBLINGUAL | Status: DC | PRN

## 2013-06-01 NOTE — Discharge Summary (Signed)
Physician Discharge Summary  Patient ID: Victor [name] MRN: [id number] DOB/AGE: [birth year] 75 y.o.  Admit date: 05/31/2013 Discharge date: 06/01/2013  Primary Discharge Diagnosis:  1. Chest Pain 2. CAD Secondary Discharge Diagnosis:  1. Carotid Artery Stensosis 2. Paroxymal Ventricular tachycardia 3. Ischemic CM 4. ICD in situ Hospital doctor) 5. Diabetes   Significant Diagnostic Studies:None  Consults: None  Hospital Course: Victor [name] is a 77 year old patient of Victor [name] admitted with recurrent chest pain with known history of CAD, status post CABG 1990, sustained VT and syncope, status post ICD implant at [facility]-most recent generator change in 2012 ). The patient had a right and left heart catheterization completed status post PCI of the SVG to the OM 3. He was continued on dual antiplatelet therapy. The patient is enrolled in the [facility] research trial.  He is also being followed closely for systolic heart failure. He was admitted on 05/31/2013 in the setting of recurrent shortness of breath, dizziness, and chest discomfort with intermittent disorientation. Due to multiple symptoms, the patient was admitted for observation.    During hospitalization the patient's carvedilol was discontinued feeling that this may be causing some of his symptoms. He was unable to tolerate metoprolol in the past. His furosemide was titrated up. Labs were reviewed, his pro BNP was 2692. Lasix was increased to 40 mg daily. He was continued on Plavix aspirin, statin and potassium.    On day of discharge the patient was seen and examined by Victor [name] and found to be stable for discharge. Patient's chest discomfort and shortness of breath were resolved with discontinuation of carvedilol. He will followup on her previously scheduled appointment with cardiology. He has been advised not to take carvedilol.         Discharge Exam: Blood pressure 140/49, pulse 59,  temperature 97.8 F (36.6 C), temperature source Oral, resp. rate 18, height 5\' 8"  (1.727 m), weight 168 lb 3.4 oz (76.3 kg), SpO2 100.00%.  Please see am note for assessment.  Labs:   Lab Results  Component Value Date   WBC 6.7 05/31/2013   HGB 12.0* 05/31/2013   HCT 36.9* 05/31/2013   MCV 88.1 05/31/2013   PLT 131* 05/31/2013    Recent Labs Lab 06/01/13 0300  NA 139  K 4.2  CL 104  CO2 28  BUN 21  CREATININE 0.70  CALCIUM 8.8  GLUCOSE 123*   Lab Results  Component Value Date   TROPONINI <0.30 06/01/2013         Radiology: Dg Chest 2 View  05/31/2013   *RADIOLOGY REPORT*  Clinical Data: Short of breath, chest pain, sweating  CHEST - 2 VIEW  Comparison: Chest x-ray of 04/24/2013  Findings: No active infiltrate or effusion is seen.  Cardiomegaly is stable and defibrillator leads remain.  There are mild degenerative changes in the thoracic spine.  IMPRESSION: Stable cardiomegaly.  Defibrillator leads remain.  No active lung disease.   Original Report Authenticated By: Dwyane Dee, M.D.    JYN:WGNFA rhythm with marked sinus arrhythmia with 1st degree A-V block rate of 49 bpm (admission)  FOLLOW UP PLANS AND APPOINTMENTS Discharge Orders   Future Appointments Provider Department Dept Phone   06/06/2013 12:00 PM Victor Meo, PA-C [place] [facility] [place]) [phone]   07/30/2013 3:45 PM Victor Maw, MD [facility] Main [facility] [place]) [phone]   Future Orders Complete By Expires     Diet - low sodium heart healthy  As  directed     Increase activity slowly  As directed         Medication List    STOP taking these medications       carvedilol 3.125 MG tablet  Commonly known as:  COREG      TAKE these medications       ASPIR-81 81 MG EC tablet  Generic drug:  aspirin  Take 81 mg by mouth at bedtime.     clopidogrel 75 MG tablet  Commonly known as:  PLAVIX  Take 1 tablet (75 mg total) by mouth daily with breakfast.     etodolac 400  MG tablet  Commonly known as:  LODINE  Take 400 mg by mouth at bedtime.     furosemide 20 MG tablet  Commonly known as:  LASIX  Take 20 mg by mouth every morning.     insulin detemir 100 UNIT/ML injection  Commonly known as:  LEVEMIR  Inject 30 Units into the skin at bedtime.     lisinopril 2.5 MG tablet  Commonly known as:  PRINIVIL,ZESTRIL  Take 1 tablet (2.5 mg total) by mouth daily.     LORCET 5-325 MG per tablet  Generic drug:  HYDROcodone-acetaminophen  Take 1 tablet by mouth every 6 (six) hours as needed for pain.     LYRICA 75 MG capsule  Generic drug:  pregabalin  Take 150 mg by mouth daily.     metFORMIN 1000 MG tablet  Commonly known as:  GLUCOPHAGE  Take 1,000 mg by mouth 2 (two) times daily with a meal.     nitroGLYCERIN 0.4 MG SL tablet  Commonly known as:  NITROSTAT  Place 1 tablet (0.4 mg total) under the tongue every 5 (five) minutes x 3 doses as needed for chest pain.     potassium chloride 20 MEQ packet  Commonly known as:  KLOR-CON  Take 20 mEq by mouth every other day.     pravastatin 40 MG tablet  Commonly known as:  PRAVACHOL  Take 40 mg by mouth at bedtime.     SKELAXIN 800 MG tablet  Generic drug:  metaxalone  Take 800 mg by mouth 2 (two) times daily.     vitamin B-12 1000 MCG tablet  Commonly known as:  CYANOCOBALAMIN  Take 1,000 mcg by mouth daily.           Follow-up Information   Please follow up. (Keep previously scheduled appt with cardiology)         Time spent with patient to include physician time:35 mintues Signed: Joni Reining 06/01/2013, 11:02 AM Co-Sign MD

## 2013-06-01 NOTE — Progress Notes (Signed)
Patient asked for blood sugar to be checked because his legs were bothering him and that normally happened when his sugar is high.  CBG was 112.  Asked patient if he needed anything to help his legs.  He stated he was just going to go back to sleep.  Will continue to monitor patient.

## 2013-06-01 NOTE — Progress Notes (Signed)
Patient Name: Victor [name]      SUBJECTIVE admitted yesterday with chest pain and shortness of breath. He has a complex cardiac history outlined yesterday. Of note he had similar symptoms with the attempt to use metoprolol sometime ago. Also of note he underwent catheterization revascularization one month ago. Serial cardiac enzymes are negative  No chest pain and breathing almost back at baseline  Past Medical History  Diagnosis Date  . Arthropathy, unspecified, site unspecified   . Occlusion and stenosis of carotid artery without mention of cerebral infarction   . Raynaud's syndrome   . Congestive heart failure, unspecified   . Paroxysmal ventricular tachycardia   . Other specified forms of chronic ischemic heart disease   . Peripheral vascular disease   . Cardiomyopathy, ischemic   . Automatic implantable cardioverter-defibrillator in situ   . High cholesterol   . Myocardial infarction 1990    "4-5 while in [facility] til they put in ICD" (04/24/2013)  . Shortness of breath     "both exertion and lying down" (04/24/2013)  . GERD (gastroesophageal reflux disease)   . Osteoarthrosis, unspecified whether generalized or localized, unspecified site   . Degenerative joint disease   . Arthritis     "back and hands" (04/24/2013)  . Chronic lower back pain   . Coronary artery disease   . Type II or unspecified type diabetes mellitus without mention of complication, not stated as uncontrolled     insulin dependent    Scheduled Meds:  Scheduled Meds: . aspirin EC  81 mg Oral Daily  . clopidogrel  75 mg Oral Q breakfast  . furosemide  40 mg Oral Daily  . insulin aspart  0-15 Units Subcutaneous TID WC  . insulin detemir  30 Units Subcutaneous Daily  . lisinopril  2.5 mg Oral Daily  . metaxalone  800 mg Oral BID  . metFORMIN  1,000 mg Oral BID WC  . potassium chloride  20 mEq Oral QODAY  . pregabalin  150 mg Oral Daily  . simvastatin  20 mg Oral q1800  . vitamin B-12  1,000  mcg Oral Daily   Continuous Infusions:   PHYSICAL EXAM Filed Vitals:   05/31/13 1533 05/31/13 1545 05/31/13 2113 06/01/13 0611  BP: 159/59  145/55 140/49  Pulse:   63 59  Temp: 97.4 F (36.3 C)  97.9 F (36.6 C) 97.8 F (36.6 C)  TempSrc: Oral  Oral Oral  Resp: 20  18 18   Height:  5\' 8"  (1.727 m)    Weight:  169 lb 8.5 oz (76.9 kg)  168 lb 3.4 oz (76.3 kg)  SpO2: 97% 96% 100% 100%    Well developed and nourished in no acute distress HENT normal Neck supple with JVP-flat Clear Regular rate and rhythm, no murmurs or gallops Abd-soft with active BS No Clubbing cyanosis edema Skin-warm and dry A & Oriented  Grossly normal sensory and motor function   Tel>>sinus   Intake/Output Summary (Last 24 hours) at 06/01/13 0951 Last data filed at 06/01/13 0855  Gross per 24 hour  Intake    960 ml  Output   1050 ml  Net    -90 ml    LABS: Basic Metabolic Panel:  Recent Labs Lab 05/31/13 1212 06/01/13 0300  NA 143 139  K 4.2 4.2  CL 103 104  CO2 28 28  GLUCOSE 129* 123*  BUN 22 21  CREATININE 0.89 0.70  CALCIUM 10.0 8.8   Cardiac Enzymes:  Recent Labs  05/31/13 1606 05/31/13 2351 06/01/13 0300  TROPONINI <0.30 <0.30 <0.30   CBC:  Recent Labs Lab 05/31/13 1212  WBC 6.7  NEUTROABS 4.0  HGB 12.0*  HCT 36.9*  MCV 88.1  PLT 131*   PROTIME: No results found for this basename: LABPROT, INR,  in the last 72 hours Liver Function Tests: No results found for this basename: AST, ALT, ALKPHOS, BILITOT, PROT, ALBUMIN,  in the last 72 hours No results found for this basename: LIPASE, AMYLASE,  in the last 72 hours BNP: BNP (last 3 results)  Recent Labs  04/24/13 1749 05/31/13 1212  PROBNP 1857.0* 2692.0*      ASSESSMENT AND PLAN:  Chestpain and shortness of breath much improved Question is this related to betablcokers again>> have been stopped and will discharge to home  Has transiton followup already available  Signed, Sherryl Manges  MD  06/01/2013

## 2013-06-05 DIAGNOSIS — E1149 Type 2 diabetes mellitus with other diabetic neurological complication: Secondary | ICD-10-CM | POA: Diagnosis not present

## 2013-06-06 ENCOUNTER — Encounter: Payer: Self-pay | Admitting: Cardiology

## 2013-06-06 ENCOUNTER — Encounter: Payer: Self-pay | Admitting: Internal Medicine

## 2013-06-06 ENCOUNTER — Ambulatory Visit (INDEPENDENT_AMBULATORY_CARE_PROVIDER_SITE_OTHER): Payer: Medicare Other | Admitting: Cardiology

## 2013-06-06 VITALS — BP 132/60 | HR 77 | Ht 68.0 in | Wt 172.0 lb

## 2013-06-06 DIAGNOSIS — R5383 Other fatigue: Secondary | ICD-10-CM

## 2013-06-06 DIAGNOSIS — I255 Ischemic cardiomyopathy: Secondary | ICD-10-CM

## 2013-06-06 DIAGNOSIS — I472 Ventricular tachycardia: Secondary | ICD-10-CM | POA: Diagnosis not present

## 2013-06-06 DIAGNOSIS — R5381 Other malaise: Secondary | ICD-10-CM | POA: Diagnosis not present

## 2013-06-06 DIAGNOSIS — Z9861 Coronary angioplasty status: Secondary | ICD-10-CM | POA: Diagnosis not present

## 2013-06-06 DIAGNOSIS — I2510 Atherosclerotic heart disease of native coronary artery without angina pectoris: Secondary | ICD-10-CM | POA: Diagnosis not present

## 2013-06-06 DIAGNOSIS — Z955 Presence of coronary angioplasty implant and graft: Secondary | ICD-10-CM

## 2013-06-06 DIAGNOSIS — Z95810 Presence of automatic (implantable) cardiac defibrillator: Secondary | ICD-10-CM

## 2013-06-06 DIAGNOSIS — I2589 Other forms of chronic ischemic heart disease: Secondary | ICD-10-CM

## 2013-06-06 DIAGNOSIS — I5022 Chronic systolic (congestive) heart failure: Secondary | ICD-10-CM

## 2013-06-06 NOTE — Patient Instructions (Addendum)
You are scheduled for follow-up with Dr. Ladona Ridgel in 3 weeks on June 26, 2013 at 2:30 PM.

## 2013-06-06 NOTE — Progress Notes (Signed)
ELECTROPHYSIOLOGY OFFICE NOTE  Patient ID: Victor [name] MRN: [id number], DOB/AGE: Aug 05, 1936   Date of Visit: 06/06/2013  Primary Physician: Ailene Ravel, MD Primary Cardiologist: Lewayne Bunting, MD Reason for Visit: Hospital follow-up  History of Present Illness  Victor [name] is a 77 year old man with an ischemic CM, chronic systolic HF, CAD s/p CABG 1990 and sustained VT and syncope s/p ICD implant who was admitted to [facility] on 04/24/2013 with acute on chronic systolic HF and [place]. CEs negative. He was diuresed with IV Lasix with improved SOB and orthopnea. He then underwent a right and left heart catheterization on 04/26/2013 with details as outlined below. He is now s/p PCI of SVG to OM-3. He was continued on medical therapy with the addition of Brilinta. However, due to an episode of acute dyspnea after his procedure, Dr. Excell Seltzer elected to discontinue Brilinta in favor of Plavix which he will continue for at least 12 months. Of note, he was enrolled in the [facility] research trial. He was initiated on appropriate medical therapy for HF including lisinopril and carvedilol.   Since discharge, he has been followed in the office closely for up-titration of his HF regimen. On 05/07/2013 his Coreg dose was increased to 6.25 mg twice daily. He was then seen in follow-up on 05/28/2013 at which time he was found to have significant bradycardia with rate of 42 bpm. He reported DOE, fatigue, exercise intolerance and feeling "not like himself." His carvedilol was down-titrated to 3.125 mg twice daily. He was then readmitted to [facility] on 05/31/2013 with DOE, fatigue, chest pain, dizziness, intermittent disorientation, visual changes and diminished appetite. His Coreg was discontinued and he was discharged on 06/01/2013.   He presents today for follow-up and reports that nearly all of his symptoms are improved; however, he reports persistent fatigue / lack of energy. His PCP continues to adjust his insulin  dose but he denies any other changes to his medications. He denies CP, SOB or palpitations. He denies LE swelling, orthopnea or PND. He denies dizziness, near syncope or syncope. He denies ICD shocks.  Past Medical History Past Medical History  Diagnosis Date  . Arthropathy, unspecified, site unspecified   . Occlusion and stenosis of carotid artery without mention of cerebral infarction   . Raynaud's syndrome   . Congestive heart failure, unspecified   . Paroxysmal ventricular tachycardia   . Other specified forms of chronic ischemic heart disease   . Peripheral vascular disease   . Cardiomyopathy, ischemic   . Automatic implantable cardioverter-defibrillator in situ   . High cholesterol   . Myocardial infarction 1990    "4-5 while in [facility] til they put in ICD" (04/24/2013)  . Shortness of breath     "both exertion and lying down" (04/24/2013)  . GERD (gastroesophageal reflux disease)   . Osteoarthrosis, unspecified whether generalized or localized, unspecified site   . Degenerative joint disease   . Arthritis     "back and hands" (04/24/2013)  . Chronic lower back pain   . Coronary artery disease   . Type II or unspecified type diabetes mellitus without mention of complication, not stated as uncontrolled     insulin dependent    Past Surgical History Past Surgical History  Procedure Laterality Date  . Anal fistulectomy  2000's  . Cataract extraction, bilateral Bilateral ~ 2009  . Coronary artery bypass graft  01/1989    "CABG X3" 96/02/2013)  . Cardiac defibrillator placement  02/1989-12/2011    "I've  had a total of 6 or 7 defibrillators put in" (04/24/2013)  . Finger fracture surgery Left ~ 2000    "pointer" (04/24/2013)    Allergies/Intolerances Allergies  Allergen Reactions  . Coreg (Carvedilol)     Shortness of breath ,fatigue ,dizzyness   . Lovastatin     CK elevation, Myalgias   Current Home Medications Current Outpatient Prescriptions  Medication Sig Dispense  Refill  . aspirin (ASPIR-81) 81 MG EC tablet Take 81 mg by mouth at bedtime.       . clopidogrel (PLAVIX) 75 MG tablet Take 1 tablet (75 mg total) by mouth daily with breakfast.  30 tablet  11  . etodolac (LODINE) 400 MG tablet Take 400 mg by mouth at bedtime.       . furosemide (LASIX) 20 MG tablet Take 20 mg by mouth every morning.       Marland Kitchen HYDROcodone-acetaminophen (LORCET) 5-325 MG per tablet Take 1 tablet by mouth every 6 (six) hours as needed for pain.      . Insulin Lispro, Human, (HUMALOG [place]) Inject into the skin. 70-30. Take as directed      . lisinopril (PRINIVIL,ZESTRIL) 2.5 MG tablet Take 1 tablet (2.5 mg total) by mouth daily.  30 tablet  4  . metaxalone (SKELAXIN) 800 MG tablet Take 800 mg by mouth 2 (two) times daily.        . metFORMIN (GLUCOPHAGE) 1000 MG tablet Take 1,000 mg by mouth 2 (two) times daily with a meal.      . nitroGLYCERIN (NITROSTAT) 0.4 MG SL tablet Place 1 tablet (0.4 mg total) under the tongue every 5 (five) minutes x 3 doses as needed for chest pain.  30 tablet  12  . potassium chloride (KLOR-CON) 20 MEQ packet Take 20 mEq by mouth every other day.        . pravastatin (PRAVACHOL) 40 MG tablet Take 40 mg by mouth at bedtime.       . pregabalin (LYRICA) 75 MG capsule Take 150 mg by mouth daily.       . vitamin B-12 (CYANOCOBALAMIN) 1000 MCG tablet Take 1,000 mcg by mouth daily.       No current facility-administered medications for this visit.   Social History Social History  . Marital Status: Married   Social History Main Topics  . Smoking status: Never Smoker   . Smokeless tobacco: Never Used  . Alcohol Use: Yes     Comment: 04/24/2013 "wine w/dinner 15-20 yr ago"  . Drug Use: No   Review of Systems General: +fatigue  No chills, fever, night sweats or weight changes Cardiovascular: No chest pain, dyspnea on exertion, edema, orthopnea, palpitations, paroxysmal nocturnal dyspnea Dermatological: No rash, lesions or masses Respiratory: No cough,  dyspnea Urologic: No hematuria, dysuria Abdominal: No nausea, vomiting, diarrhea, bright red blood per rectum, melena, or hematemesis Neurologic: No visual changes, weakness, changes in mental status All other systems reviewed and are otherwise negative except as noted above.  Physical Exam Vitals: Blood pressure 132/60, pulse 77, height 5\' 8"  (1.727 m), weight 172 lb (78.019 kg).  General: Well developed, well appearing 77 y.o. male in no acute distress. HEENT: Normocephalic, atraumatic. EOMs intact. Sclera nonicteric. Oropharynx clear.  Neck: Supple without bruits. No JVD. Lungs: Respirations regular and unlabored, CTA bilaterally. No wheezes, rales or rhonchi. Heart: RRR. S1, S2 present. No murmurs, rub, S3 or S4. Abdomen: Soft, non-tender, non-distended. BS present x 4 quadrants. No hepatosplenomegaly.  Extremities: No clubbing, cyanosis or edema. DP/PT/Radials  2+ and equal bilaterally. Psych: Normal affect. Neuro: Alert and oriented X 3. Moves all extremities spontaneously.   Diagnostics 12-lead ECG - sinus rhythm at 77 bpm with first degree AV block, PACs; no acute ST-T wave abnormalities Device interrogation - quick look today to evaluate histograms - histograms / heart rate excursion improved off Coreg  Assessment and Plan 1. Chest pain a/w SOB, dizziness, fatigue, anxiety, disorientation, blurred vision and decreased appetite   - most improved off Coreg  - with persistent fatigue, likely needs more time to resolve off Coreg 2. CAD s/p recent PCI of SVG to OM-3   - stable without anginal symptoms  - continue medical therapy 3. Ischemic CM with chronic systolic HF, ICD in place   - stable; euvolemic by exam today  - continue medical therapy 4. Paroxysmal VT   - no intercurrent ventricular arrhythmias  This plan of care was formulated with Dr. Graciela Husbands today. Signed, Rick Duff, PA-C 06/06/2013, 5:07 PM

## 2013-06-11 ENCOUNTER — Other Ambulatory Visit: Payer: Self-pay | Admitting: Internal Medicine

## 2013-06-11 LAB — ICD DEVICE OBSERVATION
FVT: 0
HV IMPEDENCE: 45 Ohm
PACEART VT: 0
RV LEAD IMPEDENCE ICD: 423 Ohm
TZON-0003SLOWVT: 363.6 ms

## 2013-06-26 ENCOUNTER — Encounter: Payer: Self-pay | Admitting: Internal Medicine

## 2013-06-26 ENCOUNTER — Ambulatory Visit (INDEPENDENT_AMBULATORY_CARE_PROVIDER_SITE_OTHER): Payer: Medicare Other | Admitting: Internal Medicine

## 2013-06-26 VITALS — BP 130/62 | HR 49 | Ht 68.0 in | Wt 173.4 lb

## 2013-06-26 DIAGNOSIS — I2510 Atherosclerotic heart disease of native coronary artery without angina pectoris: Secondary | ICD-10-CM

## 2013-06-26 DIAGNOSIS — I5022 Chronic systolic (congestive) heart failure: Secondary | ICD-10-CM | POA: Diagnosis not present

## 2013-06-26 DIAGNOSIS — I509 Heart failure, unspecified: Secondary | ICD-10-CM

## 2013-06-26 DIAGNOSIS — I2589 Other forms of chronic ischemic heart disease: Secondary | ICD-10-CM

## 2013-06-26 DIAGNOSIS — I472 Ventricular tachycardia, unspecified: Secondary | ICD-10-CM

## 2013-06-26 DIAGNOSIS — Z95810 Presence of automatic (implantable) cardiac defibrillator: Secondary | ICD-10-CM | POA: Diagnosis not present

## 2013-06-26 LAB — ICD DEVICE OBSERVATION
DEV-0020ICD: NEGATIVE
HV IMPEDENCE: 59 Ohm
RV LEAD THRESHOLD: 0.7 V
VENTRICULAR PACING ICD: 1 pct

## 2013-06-26 MED ORDER — FUROSEMIDE 20 MG PO TABS: ORAL_TABLET | ORAL | Status: DC

## 2013-06-26 NOTE — Patient Instructions (Addendum)
Your physician recommends that you schedule a follow-up appointment in:  3 months in the device clinic  Our office will contact regarding follow up appt with Dr. Ladona Ridgel  Your physician has recommended you make the following change in your medication:  Take furosemide 60 mg by mouth daily for 4 days (this will be 3 of the 20 mg tablets). After 4 days take furosemide 40 mg by mouth daily (this will be 2 of the 20 mg tablets).

## 2013-06-27 ENCOUNTER — Encounter: Payer: Self-pay | Admitting: Internal Medicine

## 2013-06-27 NOTE — Assessment & Plan Note (Signed)
He is status post angioplasty and stenting of a vein graft to his marginal branch. No evidence of recurrent stenosis.

## 2013-06-27 NOTE — Progress Notes (Signed)
HPI [name] returns today for followup. He is a 77 year old man with an ischemic cardiomyopathy, ventricular tachycardia, and chronic systolic heart failure. He is status post ICD implantation. Over the last several months, he has had worsening heart failure symptoms. Recent catheterization demonstrated well preserved hemodynamics but a 95% saphenous vein graft stenosis. He underwent angioplasty and stenting of this vessel. The patient has continued to struggle with feeling poorly. Mainly he is weak. He does have dyspnea with exertion. He is been on fairly low dose diuretic therapy with Lasix 20 mg daily. He had to stop his beta blocker because of hypotension and dizziness. Allergies  Allergen Reactions  . Coreg (Carvedilol)     Shortness of breath ,fatigue ,dizzyness   . Lovastatin     CK elevation, Myalgias     Current Outpatient Prescriptions  Medication Sig Dispense Refill  . aspirin (ASPIR-81) 81 MG EC tablet Take 81 mg by mouth at bedtime.       . clopidogrel (PLAVIX) 75 MG tablet Take 1 tablet (75 mg total) by mouth daily with breakfast.  30 tablet  11  . etodolac (LODINE) 400 MG tablet Take 400 mg by mouth at bedtime.       . furosemide (LASIX) 20 MG tablet Take as directed  180 tablet  1  . HYDROcodone-acetaminophen (LORCET) 5-325 MG per tablet Take 1 tablet by mouth every 6 (six) hours as needed for pain.      . Insulin Lispro, Human, (HUMALOG [place]) Inject into the skin. 20 in the morning and 10 at night ------70-30. Take as directed      . lisinopril (PRINIVIL,ZESTRIL) 2.5 MG tablet Take 1 tablet (2.5 mg total) by mouth daily.  30 tablet  4  . metaxalone (SKELAXIN) 800 MG tablet Take 800 mg by mouth 2 (two) times daily.        . metFORMIN (GLUCOPHAGE) 1000 MG tablet Take 1,000 mg by mouth 2 (two) times daily with a meal.      . nitroGLYCERIN (NITROSTAT) 0.4 MG SL tablet Place 1 tablet (0.4 mg total) under the tongue every 5 (five) minutes x 3 doses as needed for chest pain.  30  tablet  12  . potassium chloride (KLOR-CON) 20 MEQ packet Take 20 mEq by mouth every other day.        . pravastatin (PRAVACHOL) 40 MG tablet Take 40 mg by mouth at bedtime.       . pregabalin (LYRICA) 75 MG capsule Take 150 mg by mouth daily.       . vitamin B-12 (CYANOCOBALAMIN) 1000 MCG tablet Take 1,000 mcg by mouth daily.       No current facility-administered medications for this visit.     Past Medical History  Diagnosis Date  . Arthropathy, unspecified, site unspecified   . Occlusion and stenosis of carotid artery without mention of cerebral infarction   . Raynaud's syndrome   . Congestive heart failure, unspecified   . Paroxysmal ventricular tachycardia   . Other specified forms of chronic ischemic heart disease   . Peripheral vascular disease   . Cardiomyopathy, ischemic   . Automatic implantable cardioverter-defibrillator in situ   . High cholesterol   . Myocardial infarction 1990    "4-5 while in [facility] til they put in ICD" (04/24/2013)  . Shortness of breath     "both exertion and lying down" (04/24/2013)  . GERD (gastroesophageal reflux disease)   . Osteoarthrosis, unspecified whether generalized or localized, unspecified site   .  Degenerative joint disease   . Arthritis     "back and hands" (04/24/2013)  . Chronic lower back pain   . Coronary artery disease   . Type II or unspecified type diabetes mellitus without mention of complication, not stated as uncontrolled     insulin dependent    ROS:   All systems reviewed and negative except as noted in the HPI.   Past Surgical History  Procedure Laterality Date  . Anal fistulectomy  2000's  . Cataract extraction, bilateral Bilateral ~ 2009  . Coronary artery bypass graft  01/1989    "CABG X3" 96/02/2013)  . Cardiac defibrillator placement  02/1989-12/2011    "I've had a total of 6 or 7 defibrillators put in" (04/24/2013)  . Finger fracture surgery Left ~ 2000    "pointer" (04/24/2013)     Family History   Problem Relation Age of Onset  . Heart failure Mother 65  . Heart attack Father   . Leukemia Sister   . Stomach cancer Maternal Grandfather      History   Social History  . Marital Status: Married    Spouse Name: N/A    Number of Children: N/A  . Years of Education: N/A   Occupational History  . Not on file.   Social History Main Topics  . Smoking status: Never Smoker   . Smokeless tobacco: Never Used  . Alcohol Use: Yes     Comment: 04/24/2013 "wine w/dinner 15-20 yr ago"  . Drug Use: No  . Sexually Active: Not on file   Other Topics Concern  . 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      BP 130/62  Pulse 49  Ht 5\' 8"  (1.727 m)  Wt 173 lb 6.4 oz (78.654 kg)  BMI 26.37 kg/m2  SpO2 96%  Physical Exam:  Well appearing 77 year old man, NAD HEENT: Unremarkable Neck:  6 cm JVD, no thyromegally Back:  No CVA tenderness Lungs:  Clear ith no wheezes, rales, or rhonchi. HEART:  Regular rate rhythm, no murmurs, no rubs, no clicks Abd:  soft, positive bowel sounds, no organomegally, no rebound, no guarding Ext:  2 plus pulses, no edema, no cyanosis, no clubbing Skin:  No rashes no nodules Neuro:  CN II through XII intact, motor grossly intact   DEVICE  Normal device function.  See PaceArt for details.   Assess/Plan:

## 2013-06-27 NOTE — Assessment & Plan Note (Signed)
His [employer] device is working normally. He currently does not have evidence of chronotropic incompetence though he does note some bradycardia by history. He will undergo watchful waiting.

## 2013-06-27 NOTE — Assessment & Plan Note (Signed)
He is status post recent PCI and stent of his vein graft to a marginal branch. No recurrent anginal symptoms.

## 2013-06-27 NOTE — Assessment & Plan Note (Signed)
Previously, the patient's hemodynamics were fairly well compensated. His symptoms today are a mix of low output and high filling pressures. I've asked the patient to increase his Lasix for several days to see if this improves his shortness of breath. I have counseled him that this could make his weakness worse and if it does to cut back on the Lasix to his basic dose of 20 mg a day.

## 2013-07-30 ENCOUNTER — Encounter: Payer: Medicare Other | Admitting: Internal Medicine

## 2013-08-07 DIAGNOSIS — R609 Edema, unspecified: Secondary | ICD-10-CM | POA: Diagnosis not present

## 2013-08-07 DIAGNOSIS — E1149 Type 2 diabetes mellitus with other diabetic neurological complication: Secondary | ICD-10-CM | POA: Diagnosis not present

## 2013-08-07 DIAGNOSIS — E7800 Pure hypercholesterolemia, unspecified: Secondary | ICD-10-CM | POA: Diagnosis not present

## 2013-08-07 DIAGNOSIS — E876 Hypokalemia: Secondary | ICD-10-CM | POA: Diagnosis not present

## 2013-08-13 ENCOUNTER — Encounter: Payer: Self-pay | Admitting: Internal Medicine

## 2013-09-04 ENCOUNTER — Encounter: Payer: Self-pay | Admitting: Internal Medicine

## 2013-09-11 ENCOUNTER — Encounter: Payer: Self-pay | Admitting: Internal Medicine

## 2013-10-02 ENCOUNTER — Encounter: Payer: Self-pay | Admitting: Internal Medicine

## 2013-10-02 ENCOUNTER — Ambulatory Visit (INDEPENDENT_AMBULATORY_CARE_PROVIDER_SITE_OTHER): Payer: Medicare Other | Admitting: *Deleted

## 2013-10-02 ENCOUNTER — Other Ambulatory Visit: Payer: Self-pay | Admitting: *Deleted

## 2013-10-02 DIAGNOSIS — I2589 Other forms of chronic ischemic heart disease: Secondary | ICD-10-CM | POA: Diagnosis not present

## 2013-10-02 DIAGNOSIS — I509 Heart failure, unspecified: Secondary | ICD-10-CM

## 2013-10-02 DIAGNOSIS — Z95810 Presence of automatic (implantable) cardiac defibrillator: Secondary | ICD-10-CM

## 2013-10-02 DIAGNOSIS — I472 Ventricular tachycardia: Secondary | ICD-10-CM

## 2013-10-02 MED ORDER — LISINOPRIL 2.5 MG PO TABS: 2.5000 mg | ORAL_TABLET | Freq: Every day | ORAL | Status: DC

## 2013-10-02 NOTE — Progress Notes (Signed)
Device check in clinic, all functions normal. Changed VT initial detection duration from 3.0s to 5.0s. 2 NSVT---longest 12 beats.  Full details in PaceArt.   ROV w/ Dr. Ladona Ridgel 01/07/14 @ 12:00

## 2013-10-04 LAB — MDC_IDC_ENUM_SESS_TYPE_INCLINIC
Brady Statistic RV Percent Paced: 1 % — CL
Date Time Interrogation Session: 20141112050000
HighPow Impedance: 36 Ohm
Implantable Pulse Generator Serial Number: 118170
Lead Channel Impedance Value: 430 Ohm
Lead Channel Pacing Threshold Amplitude: 1 V
Lead Channel Sensing Intrinsic Amplitude: 16.7 mV
Lead Channel Setting Sensing Sensitivity: 0.4 mV
Zone Setting Detection Interval: 308 ms
Zone Setting Detection Interval: 364 ms

## 2013-10-14 DIAGNOSIS — M47817 Spondylosis without myelopathy or radiculopathy, lumbosacral region: Secondary | ICD-10-CM | POA: Diagnosis not present

## 2013-11-04 DIAGNOSIS — Z23 Encounter for immunization: Secondary | ICD-10-CM | POA: Diagnosis not present

## 2013-12-11 DIAGNOSIS — R609 Edema, unspecified: Secondary | ICD-10-CM | POA: Diagnosis not present

## 2013-12-11 DIAGNOSIS — E538 Deficiency of other specified B group vitamins: Secondary | ICD-10-CM | POA: Diagnosis not present

## 2013-12-11 DIAGNOSIS — R5383 Other fatigue: Secondary | ICD-10-CM | POA: Diagnosis not present

## 2013-12-11 DIAGNOSIS — R5381 Other malaise: Secondary | ICD-10-CM | POA: Diagnosis not present

## 2013-12-11 DIAGNOSIS — E876 Hypokalemia: Secondary | ICD-10-CM | POA: Diagnosis not present

## 2013-12-11 DIAGNOSIS — Z125 Encounter for screening for malignant neoplasm of prostate: Secondary | ICD-10-CM | POA: Diagnosis not present

## 2013-12-11 DIAGNOSIS — E7800 Pure hypercholesterolemia, unspecified: Secondary | ICD-10-CM | POA: Diagnosis not present

## 2013-12-11 DIAGNOSIS — E1149 Type 2 diabetes mellitus with other diabetic neurological complication: Secondary | ICD-10-CM | POA: Diagnosis not present

## 2013-12-11 DIAGNOSIS — Z23 Encounter for immunization: Secondary | ICD-10-CM | POA: Diagnosis not present

## 2014-01-01 ENCOUNTER — Other Ambulatory Visit: Payer: Self-pay | Admitting: Internal Medicine

## 2014-01-07 ENCOUNTER — Encounter: Payer: Medicare Other | Admitting: Internal Medicine

## 2014-01-08 ENCOUNTER — Other Ambulatory Visit: Payer: Self-pay | Admitting: Internal Medicine

## 2014-01-21 ENCOUNTER — Encounter: Payer: Self-pay | Admitting: Internal Medicine

## 2014-01-21 ENCOUNTER — Ambulatory Visit (INDEPENDENT_AMBULATORY_CARE_PROVIDER_SITE_OTHER): Payer: Medicare Other | Admitting: Internal Medicine

## 2014-01-21 VITALS — BP 146/62 | HR 71 | Ht 68.0 in | Wt 189.8 lb

## 2014-01-21 DIAGNOSIS — Z95810 Presence of automatic (implantable) cardiac defibrillator: Secondary | ICD-10-CM

## 2014-01-21 DIAGNOSIS — I4729 Other ventricular tachycardia: Secondary | ICD-10-CM | POA: Diagnosis not present

## 2014-01-21 DIAGNOSIS — I2589 Other forms of chronic ischemic heart disease: Secondary | ICD-10-CM

## 2014-01-21 DIAGNOSIS — I472 Ventricular tachycardia: Secondary | ICD-10-CM

## 2014-01-21 DIAGNOSIS — R079 Chest pain, unspecified: Secondary | ICD-10-CM | POA: Diagnosis not present

## 2014-01-21 DIAGNOSIS — R9431 Abnormal electrocardiogram [ECG] [EKG]: Secondary | ICD-10-CM

## 2014-01-21 LAB — MDC_IDC_ENUM_SESS_TYPE_INCLINIC
HighPow Impedance: 36 Ohm
HighPow Impedance: 54 Ohm
Lead Channel Impedance Value: 450 Ohm
Lead Channel Pacing Threshold Amplitude: 1 V
Lead Channel Sensing Intrinsic Amplitude: 15.2 mV
Lead Channel Setting Pacing Amplitude: 2.4 V
Lead Channel Setting Pacing Pulse Width: 0.4 ms
Lead Channel Setting Sensing Sensitivity: 0.4 mV
MDC IDC MSMT LEADCHNL RV PACING THRESHOLD PULSEWIDTH: 0.4 ms
MDC IDC PG SERIAL: 118170
MDC IDC SESS DTM: 20150303050000
MDC IDC SET ZONE DETECTION INTERVAL: 273 ms
MDC IDC SET ZONE DETECTION INTERVAL: 308 ms
MDC IDC SET ZONE DETECTION INTERVAL: 364 ms
MDC IDC STAT BRADY RV PERCENT PACED: 1 % — AB

## 2014-01-21 NOTE — Assessment & Plan Note (Signed)
He has had recurrent chest pain. His ECG is not acute. I am concerned about stent restenosis. I will schedule a Lexiscan myoview.

## 2014-01-21 NOTE — Progress Notes (Signed)
HPI Mr. Sossamon returns today for followup. He is a 78 year old man with an ischemic cardiomyopathy, ventricular tachycardia, and chronic systolic heart failure. He is status post ICD implantation. Over the last several months, he has had worsening heart failure symptoms. Recent catheterization demonstrated well preserved hemodynamics but a 95% saphenous vein graft stenosis. He underwent angioplasty and stenting of this vessel. The patient has continued to struggle with feeling poorly. Mainly he is weak. He does have dyspnea with exertion. He is been on fairly low dose diuretic therapy with Lasix 20 mg daily. Recently he has developed sscp, which occurs both with exertion and rest. It is slightly different than his prior episodes of pain. He denies medical non-compliance.  Allergies  Allergen Reactions  . Coreg [Carvedilol]     Shortness of breath ,fatigue ,dizzyness   . Lovastatin     CK elevation, Myalgias     Current Outpatient Prescriptions  Medication Sig Dispense Refill  . aspirin (ASPIR-81) 81 MG EC tablet Take 81 mg by mouth at bedtime.       . clopidogrel (PLAVIX) 75 MG tablet Take 1 tablet (75 mg total) by mouth daily with breakfast.  30 tablet  11  . etodolac (LODINE) 400 MG tablet Take 400 mg by mouth at bedtime.       . furosemide (LASIX) 20 MG tablet TAKE AS DIRECTED. Pt. is currently taking 20 mg once a day (01/21/14)      . HYDROcodone-acetaminophen (LORCET) 5-325 MG per tablet Take 1 tablet by mouth every 6 (six) hours as needed for pain.      Marland Kitchen insulin aspart protamine- aspart (NOVOLOG MIX 70/30) (70-30) 100 UNIT/ML injection Inject 25 units in the morning and 10-12 units in the evening      . lisinopril (PRINIVIL,ZESTRIL) 2.5 MG tablet TAKE 1 TABLET BY MOUTH DAILY  90 tablet  0  . metaxalone (SKELAXIN) 800 MG tablet Take 800 mg by mouth 2 (two) times daily.        . metFORMIN (GLUCOPHAGE) 1000 MG tablet Take 1,000 mg by mouth 2 (two) times daily with a meal.      . nitroGLYCERIN  (NITROSTAT) 0.4 MG SL tablet Place 1 tablet (0.4 mg total) under the tongue every 5 (five) minutes x 3 doses as needed for chest pain.  30 tablet  12  . potassium chloride (KLOR-CON) 20 MEQ packet Take 20 mEq by mouth every other day.        . pravastatin (PRAVACHOL) 40 MG tablet Take 40 mg by mouth at bedtime.       . pregabalin (LYRICA) 75 MG capsule Take 150 mg by mouth daily.       . vitamin B-12 (CYANOCOBALAMIN) 1000 MCG tablet Take 1,000 mcg by mouth daily.       No current facility-administered medications for this visit.     Past Medical History  Diagnosis Date  . Arthropathy, unspecified, site unspecified   . Occlusion and stenosis of carotid artery without mention of cerebral infarction   . Raynaud's syndrome   . Congestive heart failure, unspecified   . Paroxysmal ventricular tachycardia   . Other specified forms of chronic ischemic heart disease   . Peripheral vascular disease   . Cardiomyopathy, ischemic   . Automatic implantable cardioverter-defibrillator in situ   . High cholesterol   . Myocardial infarction 1990    "4-5 while in [facility] til they put in ICD" (04/24/2013)  . Shortness of breath     "both exertion  and lying down" (04/24/2013)  . GERD (gastroesophageal reflux disease)   . Osteoarthrosis, unspecified whether generalized or localized, unspecified site   . Degenerative joint disease   . Arthritis     "back and hands" (04/24/2013)  . Chronic lower back pain   . Coronary artery disease   . Type II or unspecified type diabetes mellitus without mention of complication, not stated as uncontrolled     insulin dependent    ROS:   All systems reviewed and negative except as noted in the HPI.   Past Surgical History  Procedure Laterality Date  . Anal fistulectomy  2000's  . Cataract extraction, bilateral Bilateral ~ 2009  . Coronary artery bypass graft  01/1989    "CABG X3" 96/02/2013)  . Cardiac defibrillator placement  02/1989-12/2011    "I've had a  total of 6 or 7 defibrillators put in" (04/24/2013)  . Finger fracture surgery Left ~ 2000    "pointer" (04/24/2013)     Family History  Problem Relation Age of Onset  . Heart failure Mother 97  . Heart attack Father   . Leukemia Sister   . Stomach cancer Maternal Grandfather      History   Social History  . Marital Status: Married    Spouse Name: N/A    Number of Children: N/A  . Years of Education: N/A   Occupational History  . Not on file.   Social History Main Topics  . Smoking status: Never Smoker   . Smokeless tobacco: Never Used  . Alcohol Use: Yes     Comment: 04/24/2013 "wine w/dinner 15-20 yr ago"  . Drug Use: No  . Sexual Activity: Not on file   Other Topics Concern  . 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      BP 146/62  Pulse 71  Ht 5\' 8"  (1.727 m)  Wt 189 lb 12.8 oz (86.093 kg)  BMI 28.87 kg/m2  Physical Exam:  Well appearing 78 year old man, NAD HEENT: Unremarkable Neck:  7 cm JVD, no thyromegally Back:  No CVA tenderness Lungs:  Clear ith no wheezes, rales, or rhonchi. HEART:  Regular rate rhythm, no murmurs, no rubs, no clicks Abd:  soft, positive bowel sounds, no organomegally, no rebound, no guarding Ext:  2 plus pulses, no edema, no cyanosis, no clubbing Skin:  No rashes no nodules Neuro:  CN II through XII intact, motor grossly intact   DEVICE  Normal device function.  See PaceArt for details.   Assess/Plan:

## 2014-01-21 NOTE — Assessment & Plan Note (Signed)
His [place] DDD ICD is working normally. Will recheck in several months.

## 2014-01-21 NOTE — Patient Instructions (Signed)
Your physician recommends that you schedule a follow-up appointment in: 4 weeks with Dr Lovena Le   Your physician has requested that you have a lexiscan myoview. For further information please visit [email or web address]. Please follow instruction sheet, as given.

## 2014-01-21 NOTE — Assessment & Plan Note (Signed)
His VT has remained quiet. He has had one episode of tachycardia which may have been VT, but may have been SVT. Will not change any meds at this time.

## 2014-01-31 DIAGNOSIS — S242XXA Injury of nerve root of thoracic spine, initial encounter: Secondary | ICD-10-CM | POA: Diagnosis not present

## 2014-02-04 ENCOUNTER — Ambulatory Visit (HOSPITAL_COMMUNITY): Payer: Medicare Other | Attending: Cardiovascular Disease | Admitting: Radiology

## 2014-02-04 ENCOUNTER — Encounter: Payer: Self-pay | Admitting: Cardiovascular Disease

## 2014-02-04 VITALS — BP 137/70 | HR 67 | Ht 68.0 in | Wt 180.0 lb

## 2014-02-04 DIAGNOSIS — R9431 Abnormal electrocardiogram [ECG] [EKG]: Secondary | ICD-10-CM

## 2014-02-04 DIAGNOSIS — R079 Chest pain, unspecified: Secondary | ICD-10-CM

## 2014-02-04 DIAGNOSIS — R110 Nausea: Secondary | ICD-10-CM

## 2014-02-04 DIAGNOSIS — I2510 Atherosclerotic heart disease of native coronary artery without angina pectoris: Secondary | ICD-10-CM

## 2014-02-04 MED ORDER — AMINOPHYLLINE 25 MG/ML IV SOLN
75.0000 mg | Freq: Once | INTRAVENOUS | Status: AC
  Administered 2014-02-04: 75 mg via INTRAVENOUS

## 2014-02-04 MED ORDER — TECHNETIUM TC 99M SESTAMIBI GENERIC - CARDIOLITE
30.0000 | Freq: Once | INTRAVENOUS | Status: AC | PRN
  Administered 2014-02-04: 30 via INTRAVENOUS

## 2014-02-04 MED ORDER — TECHNETIUM TC 99M SESTAMIBI GENERIC - CARDIOLITE
10.0000 | Freq: Once | INTRAVENOUS | Status: AC | PRN
  Administered 2014-02-04: 10 via INTRAVENOUS

## 2014-02-04 MED ORDER — REGADENOSON 0.4 MG/5ML IV SOLN
0.4000 mg | Freq: Once | INTRAVENOUS | Status: AC
  Administered 2014-02-04: 0.4 mg via INTRAVENOUS

## 2014-02-04 NOTE — Progress Notes (Signed)
[facility] SITE 3 NUCLEAR MED [address] [place], [place] [address] [phone]    Cardiology Nuclear Med Study  Victor [name] is a 78 y.o. male     MRN : [id number]     DOB: 1936-07-19  Procedure Date: 02/04/2014  Nuclear Med Background Indication for Stress Test:  Evaluation for Ischemia, Graft Patency and Stent Patency History:  CAD, MI, Cath (95% SVG occl. stent, PTCA), hx. VT, Echo 2014, MPI ~20 yrs ago Cardiac Risk Factors: Family History - CAD, IDDM, Lipids and PVD  Symptoms:  Chest Pain, Chest Pain with Exertion (last date of chest discomfort was yesterday) and DOE   Nuclear Pre-Procedure Caffeine/Decaff Intake:  None > 12 hrs NPO After: 7:00am   Lungs:  clear O2 Sat: 94% on room air. IV 0.9% NS with Angio Cath:  22g  IV Site: R Forearm x 1, tolerated well IV Started by:  Irven Baltimore, RN  Chest Size (in):  42 Cup Size: n/a  Height: 5\' 8"  (1.727 m)  Weight:  180 lb (81.647 kg)  BMI:  Body mass index is 27.38 kg/(m^2). Tech Comments:  Fasting CBG was 148 at 06:30 with 1/2 dose insulin and metformin taken with breakfast. Irven Baltimore, RN.    Nuclear Med Study 1 or 2 day study: 1 day  Stress Test Type:  Carlton Adam  Reading MD: N/A  Order Authorizing Provider:  Cristopher Peru, MD  Resting Radionuclide: Technetium 68m Sestamibi  Resting Radionuclide Dose: 11.0 mCi   Stress Radionuclide:  Technetium 26m Sestamibi  Stress Radionuclide Dose: 33.0 mCi           Stress Protocol Rest HR: 67 Stress HR: 93  Rest BP: 137/70 Stress BP: 152/72  Exercise Time (min): n/a METS: n/a           Dose of Adenosine (mg):  n/a Dose of Lexiscan: 0.4 mg  Dose of Atropine (mg): n/a Dose of Dobutamine: n/a mcg/kg/min (at max HR)  Stress Test Technologist: Glade Georgios, BS-ES  Nuclear Technologist:  Charlton Amor, CNMT     Rest Procedure:  Myocardial perfusion imaging was performed at rest 45 minutes following the intravenous administration of Technetium 68m  Sestamibi. Rest ECG: Sinus rhythm, first degree AV block, RAD, inferior lateral TWI.  Stress Procedure:  The patient received IV Lexiscan 0.4 mg over 15-seconds.  Technetium 63m Sestamibi injected at 30-seconds.  Quantitative spect images were obtained after a 45 minute delay.  During the infusion, the patient complained of stomach tightness, cough and began to dry heave.  75 mg aminophylline was given by Glade Leticia, EP.  He stated he felt completely better by the end of recovery.  Stress ECG: No significant ST segment change suggestive of ischemia.  QPS Raw Data Images:  Acquisition technically good; LVE. Stress Images:  There is decreased uptake in the inferior lateral wall. Rest Images:  There is decreased uptake in the inferior lateral wall, less prominent compared to the stress images. Subtraction (SDS):  These findings are consistent with prior infarct and mild peri-infarct ischemia. Transient Ischemic Dilatation (Normal <1.22):  1.29 Lung/Heart Ratio (Normal <0.45):  0.38  Quantitative Gated Spect Images QGS EDV:  206 ml QGS ESV:  155 ml  Impression Exercise Capacity:  Lexiscan with no exercise. BP Response:  Normal blood pressure response. Clinical Symptoms:  No chest pain or dyspnea. ECG Impression:  No significant ST segment change suggestive of ischemia. Comparison with Prior Nuclear Study: No images to compare  Overall Impression:  High risk stress nuclear study with a large, severe intensity, partially reversible inferior lateral defect consistent with prior infarct and mild peri-infarct ischemia; possible transient ischemic dilatation of LV cavity.  LV Ejection Fraction: 25%.  LV Wall Motion:  Akinesis of the inferior lateral wall.   Kirk Ruths

## 2014-02-26 ENCOUNTER — Encounter: Payer: Self-pay | Admitting: Internal Medicine

## 2014-02-26 ENCOUNTER — Ambulatory Visit (INDEPENDENT_AMBULATORY_CARE_PROVIDER_SITE_OTHER): Payer: Medicare Other | Admitting: Internal Medicine

## 2014-02-26 VITALS — BP 140/60 | HR 49 | Ht 68.0 in | Wt 186.0 lb

## 2014-02-26 DIAGNOSIS — I2589 Other forms of chronic ischemic heart disease: Secondary | ICD-10-CM

## 2014-02-26 DIAGNOSIS — I472 Ventricular tachycardia, unspecified: Secondary | ICD-10-CM | POA: Diagnosis not present

## 2014-02-26 DIAGNOSIS — I509 Heart failure, unspecified: Secondary | ICD-10-CM

## 2014-02-26 DIAGNOSIS — I4729 Other ventricular tachycardia: Secondary | ICD-10-CM | POA: Diagnosis not present

## 2014-02-26 DIAGNOSIS — Z95810 Presence of automatic (implantable) cardiac defibrillator: Secondary | ICD-10-CM

## 2014-02-26 DIAGNOSIS — R079 Chest pain, unspecified: Secondary | ICD-10-CM | POA: Diagnosis not present

## 2014-02-26 LAB — MDC_IDC_ENUM_SESS_TYPE_INCLINIC
HIGH POWER IMPEDANCE MEASURED VALUE: 36 Ohm
HighPow Impedance: 55 Ohm
Implantable Pulse Generator Serial Number: 118170
Lead Channel Impedance Value: 449 Ohm
Lead Channel Pacing Threshold Amplitude: 0.8 V
Lead Channel Pacing Threshold Pulse Width: 0.4 ms
Lead Channel Setting Pacing Amplitude: 2.4 V
Lead Channel Setting Pacing Pulse Width: 0.4 ms
Lead Channel Setting Sensing Sensitivity: 0.4 mV
MDC IDC MSMT LEADCHNL RV SENSING INTR AMPL: 17.6 mV
MDC IDC SESS DTM: 20150408040000
MDC IDC SET ZONE DETECTION INTERVAL: 308 ms
Zone Setting Detection Interval: 273 ms
Zone Setting Detection Interval: 364 ms

## 2014-02-26 NOTE — Assessment & Plan Note (Signed)
He has had no recurrent sustained VT on ICD interogation. No change in meds.

## 2014-02-26 NOTE — Assessment & Plan Note (Signed)
The patient has a high risk stress test. I am concerned about restenosis of his SVG stent to the om3. This would explain his findings on stress testing. I have discussed the indications for repeat heart catheterization and he wishes to proceed. This will be scheduled in the next few weeks.

## 2014-02-26 NOTE — Assessment & Plan Note (Signed)
His ICD is interogated today and is working normally. Will recheck in several months.

## 2014-02-26 NOTE — Progress Notes (Signed)
HPI Victor [name] returns today for followup. He is a pleasant 78 yo man with an ICM, chronic systolic heart failure, VT, s/p ICD implant. In the past several months, the patient has had worsening sob and rare episodes of chest pressure. He underwent stress testing several weeks ago and had a high risk scan with both inferolateral scar and ischemia. He had undergone PCI of a 90% SVG to OM3 lesion approximately 10 months ago. He has not had any ICD therapies.  Allergies  Allergen Reactions  . Coreg [Carvedilol]     Shortness of breath ,fatigue ,dizzyness   . Lovastatin     CK elevation, Myalgias     Current Outpatient Prescriptions  Medication Sig Dispense Refill  . aspirin (ASPIR-81) 81 MG EC tablet Take 81 mg by mouth at bedtime.       . clopidogrel (PLAVIX) 75 MG tablet Take 1 tablet (75 mg total) by mouth daily with breakfast.  30 tablet  11  . etodolac (LODINE) 400 MG tablet Take 400 mg by mouth at bedtime.       . furosemide (LASIX) 20 MG tablet TAKE AS DIRECTED. Pt. is currently taking 20 mg once a day (02/26/14)      . HYDROcodone-acetaminophen (LORCET) 5-325 MG per tablet Take 1 tablet by mouth every 6 (six) hours as needed for pain.      Marland Kitchen insulin aspart protamine- aspart (NOVOLOG MIX 70/30) (70-30) 100 UNIT/ML injection Inject 25 units in the morning and 10-12 units in the evening      . lisinopril (PRINIVIL,ZESTRIL) 2.5 MG tablet TAKE 1 TABLET BY MOUTH DAILY  90 tablet  0  . metaxalone (SKELAXIN) 800 MG tablet Take 800 mg by mouth 2 (two) times daily.        . metFORMIN (GLUCOPHAGE) 1000 MG tablet Take 1,000 mg by mouth 2 (two) times daily with a meal.      . nitroGLYCERIN (NITROSTAT) 0.4 MG SL tablet Place 1 tablet (0.4 mg total) under the tongue every 5 (five) minutes x 3 doses as needed for chest pain.  30 tablet  12  . potassium chloride (KLOR-CON) 20 MEQ packet Take 20 mEq by mouth every other day.        . pravastatin (PRAVACHOL) 40 MG tablet Take 40 mg by mouth at  bedtime.       . pregabalin (LYRICA) 75 MG capsule Take 150 mg by mouth daily.       . vitamin B-12 (CYANOCOBALAMIN) 1000 MCG tablet Take 1,000 mcg by mouth daily.       No current facility-administered medications for this visit.     Past Medical History  Diagnosis Date  . Arthropathy, unspecified, site unspecified   . Occlusion and stenosis of carotid artery without mention of cerebral infarction   . Raynaud's syndrome   . Congestive heart failure, unspecified   . Paroxysmal ventricular tachycardia   . Other specified forms of chronic ischemic heart disease   . Peripheral vascular disease   . Cardiomyopathy, ischemic   . Automatic implantable cardioverter-defibrillator in situ   . High cholesterol   . Myocardial infarction 1990    "4-5 while in [facility] til they put in ICD" (04/24/2013)  . Shortness of breath     "both exertion and lying down" (04/24/2013)  . GERD (gastroesophageal reflux disease)   . Osteoarthrosis, unspecified whether generalized or localized, unspecified site   . Degenerative joint disease   . Arthritis     "  back and hands" (04/24/2013)  . Chronic lower back pain   . Coronary artery disease   . Type II or unspecified type diabetes mellitus without mention of complication, not stated as uncontrolled     insulin dependent    ROS:   All systems reviewed and negative except as noted in the HPI.   Past Surgical History  Procedure Laterality Date  . Anal fistulectomy  2000's  . Cataract extraction, bilateral Bilateral ~ 2009  . Coronary artery bypass graft  01/1989    "CABG X3" 96/02/2013)  . Cardiac defibrillator placement  02/1989-12/2011    "I've had a total of 6 or 7 defibrillators put in" (04/24/2013)  . Finger fracture surgery Left ~ 2000    "pointer" (04/24/2013)     Family History  Problem Relation Age of Onset  . Heart failure Mother 49  . Heart attack Father   . Leukemia Sister   . Stomach cancer Maternal Grandfather      History    Social History  . Marital Status: Married    Spouse Name: N/A    Number of Children: N/A  . Years of Education: N/A   Occupational History  . Not on file.   Social History Main Topics  . Smoking status: Never Smoker   . Smokeless tobacco: Never Used  . Alcohol Use: Yes     Comment: 04/24/2013 "wine w/dinner 15-20 yr ago"  . Drug Use: No  . Sexual Activity: Not on file   Other Topics Concern  . 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      BP 140/60  Pulse 49  Ht 5\' 8"  (1.727 m)  Wt 186 lb (84.369 kg)  BMI 28.29 kg/m2  SpO2 95%  Physical Exam:  Stable but chronically ill appearing NAD HEENT: Unremarkable Neck:  No JVD, no thyromegally Back:  No CVA tenderness Lungs:  Clear except for basilar rales. HEART:  Regular rate rhythm, no murmurs, no rubs, no clicks Abd:  soft, positive bowel sounds, no organomegally, no rebound, no guarding Ext:  2 plus pulses, no edema, no cyanosis, no clubbing Skin:  No rashes no nodules Neuro:  CN II through XII intact, motor grossly intact  DEVICE  Normal device function.  See PaceArt for details.   Assess/Plan:

## 2014-02-26 NOTE — Patient Instructions (Addendum)
Your physician recommends that you schedule a follow-up appointment in: 3 months with device clinic and 6 months with Dr Lovena Le   Your physician has requested that you have a cardiac catheterization. Cardiac catheterization is used to diagnose and/or treat various heart conditions. Doctors may recommend this procedure for a number of different reasons. The most common reason is to evaluate chest pain. Chest pain can be a symptom of coronary artery disease (CAD), and cardiac catheterization can show whether plaque is narrowing or blocking your heart's arteries. This procedure is also used to evaluate the valves, as well as measure the blood flow and oxygen levels in different parts of your heart. For further information please visit [email or web address]. Please follow instruction sheet, as given.---call if you decide to proceed [phone]  Dr Burt Knack can do the cath again

## 2014-02-26 NOTE — Assessment & Plan Note (Signed)
His chronic systolic heart failure is class 3. I have asked him to continue his current meds. He admits to sodium indiscretion.

## 2014-04-02 ENCOUNTER — Other Ambulatory Visit (HOSPITAL_COMMUNITY): Payer: Self-pay | Admitting: Cardiology

## 2014-04-03 ENCOUNTER — Other Ambulatory Visit: Payer: Self-pay

## 2014-04-03 ENCOUNTER — Telehealth: Payer: Self-pay | Admitting: Internal Medicine

## 2014-04-03 MED ORDER — LISINOPRIL 2.5 MG PO TABS: ORAL_TABLET | ORAL | Status: DC

## 2014-04-03 MED ORDER — CLOPIDOGREL BISULFATE 75 MG PO TABS: ORAL_TABLET | ORAL | Status: DC

## 2014-04-03 NOTE — Telephone Encounter (Signed)
New Message  [facility]  [facility] from [facility] requests [facility]. Previously sent script was for a 30 tablet with 1 refill-- Insurance will only cover a 90 day supply. Please assist//SR

## 2014-04-04 DIAGNOSIS — M5137 Other intervertebral disc degeneration, lumbosacral region: Secondary | ICD-10-CM | POA: Diagnosis not present

## 2014-05-06 DIAGNOSIS — R609 Edema, unspecified: Secondary | ICD-10-CM | POA: Diagnosis not present

## 2014-05-06 DIAGNOSIS — E1149 Type 2 diabetes mellitus with other diabetic neurological complication: Secondary | ICD-10-CM | POA: Diagnosis not present

## 2014-05-06 DIAGNOSIS — Z6827 Body mass index (BMI) 27.0-27.9, adult: Secondary | ICD-10-CM | POA: Diagnosis not present

## 2014-05-06 DIAGNOSIS — E7800 Pure hypercholesterolemia, unspecified: Secondary | ICD-10-CM | POA: Diagnosis not present

## 2014-06-19 ENCOUNTER — Ambulatory Visit (INDEPENDENT_AMBULATORY_CARE_PROVIDER_SITE_OTHER): Payer: Medicare Other | Admitting: *Deleted

## 2014-06-19 DIAGNOSIS — I2589 Other forms of chronic ischemic heart disease: Secondary | ICD-10-CM

## 2014-06-19 DIAGNOSIS — I472 Ventricular tachycardia: Secondary | ICD-10-CM

## 2014-06-19 DIAGNOSIS — I4729 Other ventricular tachycardia: Secondary | ICD-10-CM

## 2014-06-19 LAB — MDC_IDC_ENUM_SESS_TYPE_INCLINIC
HIGH POWER IMPEDANCE MEASURED VALUE: 36 Ohm
HighPow Impedance: 53 Ohm
Lead Channel Impedance Value: 434 Ohm
Lead Channel Pacing Threshold Amplitude: 1 V
Lead Channel Pacing Threshold Pulse Width: 0.4 ms
Lead Channel Sensing Intrinsic Amplitude: 14.3 mV
Lead Channel Setting Pacing Amplitude: 2.4 V
Lead Channel Setting Pacing Pulse Width: 0.4 ms
Lead Channel Setting Sensing Sensitivity: 0.4 mV
MDC IDC PG SERIAL: 118170
MDC IDC SESS DTM: 20150730040000
MDC IDC SET ZONE DETECTION INTERVAL: 308 ms
Zone Setting Detection Interval: 273 ms
Zone Setting Detection Interval: 364 ms

## 2014-06-20 NOTE — Progress Notes (Signed)
ICD check in clinic. Normal device function. Threshold and sensing consistent with previous device measurements. Impedance trends stable over time. 16 ventricular arrhythmias noted---max dur. 4 mins 53 sec---SVT for all. Histogram distribution appropriate for patient and level of activity. No changes made this session. Device programmed at appropriate safety margins. Device programmed to optimize intrinsic conduction. Estimated longevity 12 years. Plan to follow up with GT on 11-24.

## 2014-07-02 ENCOUNTER — Encounter: Payer: Self-pay | Admitting: Internal Medicine

## 2014-07-08 ENCOUNTER — Other Ambulatory Visit: Payer: Self-pay | Admitting: *Deleted

## 2014-07-08 MED ORDER — FUROSEMIDE 20 MG PO TABS: ORAL_TABLET | ORAL | Status: DC

## 2014-07-10 ENCOUNTER — Other Ambulatory Visit: Payer: Self-pay

## 2014-07-11 ENCOUNTER — Telehealth: Payer: Self-pay

## 2014-07-15 NOTE — Telephone Encounter (Signed)
2 tabs daily. GT

## 2014-07-16 ENCOUNTER — Other Ambulatory Visit: Payer: Self-pay

## 2014-07-16 MED ORDER — FUROSEMIDE 20 MG PO TABS: ORAL_TABLET | ORAL | Status: DC

## 2014-07-23 ENCOUNTER — Telehealth: Payer: Self-pay | Admitting: Internal Medicine

## 2014-07-23 NOTE — Telephone Encounter (Signed)
New message     Pt want to proceed with having a heart cath.  Please schedule cath---2nd and 3rd week in oct is not good.

## 2014-07-23 NOTE — Telephone Encounter (Signed)
Wife calling stating at his last OV Dr. Lovena Le recommended that he have a heart cath.  At that time (4/15) he did not want to proceed but wife states his condition doesn't seem to be improving and has agreed to proceed.  States can not do 9/19 or 1st week or 3rd week in Oct.  Advised Leonia Reader is not in office today and will have her call to schedule.

## 2014-07-25 NOTE — Telephone Encounter (Signed)
appt made for 07/31/14

## 2014-07-31 ENCOUNTER — Ambulatory Visit (INDEPENDENT_AMBULATORY_CARE_PROVIDER_SITE_OTHER): Payer: Medicare Other | Admitting: Internal Medicine

## 2014-07-31 ENCOUNTER — Encounter: Payer: Self-pay | Admitting: *Deleted

## 2014-07-31 ENCOUNTER — Encounter: Payer: Self-pay | Admitting: Internal Medicine

## 2014-07-31 ENCOUNTER — Encounter (HOSPITAL_COMMUNITY): Payer: Self-pay

## 2014-07-31 VITALS — BP 144/62 | HR 67 | Ht 68.0 in | Wt 178.2 lb

## 2014-07-31 DIAGNOSIS — R002 Palpitations: Secondary | ICD-10-CM | POA: Diagnosis not present

## 2014-07-31 DIAGNOSIS — I2589 Other forms of chronic ischemic heart disease: Secondary | ICD-10-CM | POA: Diagnosis not present

## 2014-07-31 DIAGNOSIS — R079 Chest pain, unspecified: Secondary | ICD-10-CM

## 2014-07-31 DIAGNOSIS — I4729 Other ventricular tachycardia: Secondary | ICD-10-CM | POA: Diagnosis not present

## 2014-07-31 DIAGNOSIS — I472 Ventricular tachycardia: Secondary | ICD-10-CM

## 2014-07-31 DIAGNOSIS — Z95810 Presence of automatic (implantable) cardiac defibrillator: Secondary | ICD-10-CM | POA: Diagnosis not present

## 2014-07-31 DIAGNOSIS — E119 Type 2 diabetes mellitus without complications: Secondary | ICD-10-CM

## 2014-07-31 DIAGNOSIS — R0602 Shortness of breath: Secondary | ICD-10-CM

## 2014-07-31 LAB — BASIC METABOLIC PANEL
BUN: 22 mg/dL (ref 6–23)
CALCIUM: 9.5 mg/dL (ref 8.4–10.5)
CO2: 29 mEq/L (ref 19–32)
CREATININE: 1 mg/dL (ref 0.4–1.5)
Chloride: 98 mEq/L (ref 96–112)
GFR: 80.85 mL/min (ref >60.00)
Glucose, Bld: 302 mg/dL — ABNORMAL HIGH (ref 70–99)
Potassium: 4.5 mEq/L (ref 3.5–5.1)
Sodium: 135 mEq/L (ref 135–145)

## 2014-07-31 LAB — CBC WITH DIFFERENTIAL/PLATELET
BASOS PCT: 0.1 % (ref 0.0–3.0)
Basophils Absolute: 0 10*3/uL (ref 0.0–0.1)
EOS PCT: 0.1 % (ref 0.0–5.0)
Eosinophils Absolute: 0 10*3/uL (ref 0.0–0.7)
HEMATOCRIT: 38.4 % — AB (ref 39.0–52.0)
Hemoglobin: 12.7 g/dL — ABNORMAL LOW (ref 13.0–17.0)
Lymphocytes Relative: 24.2 % (ref 12.0–46.0)
Lymphs Abs: 2.5 10*3/uL (ref 0.7–4.0)
MCHC: 33.2 g/dL (ref 30.0–36.0)
MCV: 87.9 fl (ref 78.0–100.0)
MONOS PCT: 2.6 % — AB (ref 3.0–12.0)
Monocytes Absolute: 0.3 10*3/uL (ref 0.1–1.0)
Neutro Abs: 7.4 10*3/uL (ref 1.4–7.7)
Neutrophils Relative %: 73 % (ref 43.0–77.0)
Platelets: 156 10*3/uL (ref 150.0–400.0)
RBC: 4.36 Mil/uL (ref 4.22–5.81)
RDW: 14.2 % (ref 11.5–15.5)
WBC: 10.2 10*3/uL (ref 4.0–10.5)

## 2014-07-31 LAB — CARDIAC PANEL
CK-MB: 2.5 ng/mL (ref 0.3–4.0)
Relative Index: 4 calc — ABNORMAL HIGH (ref 0.0–2.5)
Total CK: 63 U/L (ref 7–232)

## 2014-07-31 LAB — TSH: TSH: 0.1 u[IU]/mL — ABNORMAL LOW (ref 0.35–4.50)

## 2014-07-31 NOTE — Assessment & Plan Note (Signed)
The patient has worsening symptoms and a high risk stress test. He is now willing to proceed with catheterization as his symptoms have progressed. He does not have rest angina but does have crescendo angina. The risks/benefits/goals/expectations of left heart cath have been discussed and he wishes to proceed. He will have labs obtained today for procedure tomorrow.

## 2014-07-31 NOTE — Assessment & Plan Note (Signed)
His [employer] Sci device has been interogated and is working normally. Will follow.

## 2014-07-31 NOTE — Patient Instructions (Addendum)

## 2014-07-31 NOTE — Assessment & Plan Note (Signed)
Since his last visit, no recurrent ICD therapies. No change in medical therapy.

## 2014-07-31 NOTE — Progress Notes (Signed)
HPI [name] returns today for followup. He is a pleasant 78 yo man with an ICM, chronic systolic heart failure, VT, s/p ICD implant. In the past several months, the patient has had worsening sob and episodes of chest pressure. He underwent stress testing several months ago and had a high risk scan with both inferolateral scar and ischemia. He had undergone PCI of a 90% SVG to OM3 lesion approximately 18 months ago. He has not had any ICD therapies. When I saw the patient 4 months ago, I recommended repeating his heart cath as I was concerned he had restenosis of his PCI to SVG. He cancelled his procedure. He feels terrible and has dyspnea with exertion as well as exertional chest pressure.  Allergies  Allergen Reactions  . Coreg [Carvedilol] Other (See Comments)    Shortness of breath ,fatigue ,dizzyness   . Lovastatin Other (See Comments)    CK elevation, Myalgias     Current Outpatient Prescriptions  Medication Sig Dispense Refill  . aspirin (ASPIR-81) 81 MG EC tablet Take 81 mg by mouth at bedtime.       . clopidogrel (PLAVIX) 75 MG tablet TAKE 1 TABLET EVERY DAY WITH BREAKFAST  90 tablet  1  . etodolac (LODINE) 400 MG tablet Take 400 mg by mouth at bedtime.       . furosemide (LASIX) 20 MG tablet Take 2 tablets daily  60 tablet  1  . HYDROcodone-acetaminophen (LORCET) 5-325 MG per tablet Take 1 tablet by mouth every 6 (six) hours as needed for pain.      Marland Kitchen insulin aspart protamine- aspart (NOVOLOG MIX 70/30) (70-30) 100 UNIT/ML injection Inject 25 units in the morning and 10-12 units in the evening      . lisinopril (PRINIVIL,ZESTRIL) 2.5 MG tablet TAKE 1 TABLET BY MOUTH DAILY  90 tablet  1  . metaxalone (SKELAXIN) 800 MG tablet Take 800 mg by mouth 2 (two) times daily.        . metFORMIN (GLUCOPHAGE) 1000 MG tablet Take 1,000 mg by mouth 2 (two) times daily with a meal.      . nitroGLYCERIN (NITROSTAT) 0.4 MG SL tablet Place 1 tablet (0.4 mg total) under the tongue every 5  (five) minutes x 3 doses as needed for chest pain.  30 tablet  12  . potassium chloride (KLOR-CON) 20 MEQ packet Take 20 mEq by mouth every other day.        . pravastatin (PRAVACHOL) 40 MG tablet Take 40 mg by mouth at bedtime.       . pregabalin (LYRICA) 75 MG capsule Take 150 mg by mouth daily.       . vitamin B-12 (CYANOCOBALAMIN) 1000 MCG tablet Take 1,000 mcg by mouth daily.       No current facility-administered medications for this visit.     Past Medical History  Diagnosis Date  . Arthropathy, unspecified, site unspecified   . Occlusion and stenosis of carotid artery without mention of cerebral infarction   . Raynaud's syndrome   . Congestive heart failure, unspecified   . Paroxysmal ventricular tachycardia   . Other specified forms of chronic ischemic heart disease   . Peripheral vascular disease   . Cardiomyopathy, ischemic   . Automatic implantable cardioverter-defibrillator in situ   . High cholesterol   . Myocardial infarction 1990    "4-5 while in [facility] til they put in ICD" (04/24/2013)  . Shortness of breath     "both  exertion and lying down" (04/24/2013)  . GERD (gastroesophageal reflux disease)   . Osteoarthrosis, unspecified whether generalized or localized, unspecified site   . Degenerative joint disease   . Arthritis     "back and hands" (04/24/2013)  . Chronic lower back pain   . Coronary artery disease   . Type II or unspecified type diabetes mellitus without mention of complication, not stated as uncontrolled     insulin dependent    ROS:   All systems reviewed and negative except as noted in the HPI.   Past Surgical History  Procedure Laterality Date  . Anal fistulectomy  2000's  . Cataract extraction, bilateral Bilateral ~ 2009  . Coronary artery bypass graft  01/1989    "CABG X3" 96/02/2013)  . Cardiac defibrillator placement  02/1989-12/2011    "I've had a total of 6 or 7 defibrillators put in" (04/24/2013)  . Finger fracture surgery Left ~  2000    "pointer" (04/24/2013)     Family History  Problem Relation Age of Onset  . Heart failure Mother 59  . Heart attack Father   . Leukemia Sister   . Stomach cancer Maternal Grandfather      History   Social History  . Marital Status: Married    Spouse Name: N/A    Number of Children: N/A  . Years of Education: N/A   Occupational History  . Not on file.   Social History Main Topics  . Smoking status: Never Smoker   . Smokeless tobacco: Never Used  . Alcohol Use: Yes     Comment: 04/24/2013 "wine w/dinner 15-20 yr ago"  . Drug Use: No  . Sexual Activity: Not on file   Other Topics Concern  . 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      BP 144/62  Pulse 67  Ht 5\' 8"  (1.727 m)  Wt 178 lb 3.2 oz (80.831 kg)  BMI 27.10 kg/m2  Physical Exam:  Stable but chronically ill appearing NAD HEENT: Unremarkable Neck:  No JVD, no thyromegally Back:  No CVA tenderness Lungs:  Clear except for basilar rales. HEART:  Regular rate rhythm, no murmurs, no rubs, no clicks Abd:  soft, positive bowel sounds, no organomegally, no rebound, no guarding Ext:  2 plus pulses, no edema, no cyanosis, no clubbing Skin:  No rashes no nodules Neuro:  CN II through XII intact, motor grossly intact  DEVICE  Normal device function.  See PaceArt for details.   Assess/Plan:

## 2014-08-01 ENCOUNTER — Ambulatory Visit (HOSPITAL_COMMUNITY)
Admission: RE | Admit: 2014-08-01 | Discharge: 2014-08-01 | Disposition: A | Discharge status: Home / Self Care | Payer: Medicare Other | Source: Ambulatory Visit | Attending: Interventional Cardiology | Admitting: Interventional Cardiology

## 2014-08-01 ENCOUNTER — Encounter (HOSPITAL_COMMUNITY)
Admission: RE | Disposition: A | Discharge status: Home / Self Care | Payer: Self-pay | Source: Ambulatory Visit | Attending: Interventional Cardiology

## 2014-08-01 DIAGNOSIS — R079 Chest pain, unspecified: Secondary | ICD-10-CM

## 2014-08-01 DIAGNOSIS — I2510 Atherosclerotic heart disease of native coronary artery without angina pectoris: Secondary | ICD-10-CM | POA: Insufficient documentation

## 2014-08-01 DIAGNOSIS — E119 Type 2 diabetes mellitus without complications: Secondary | ICD-10-CM | POA: Insufficient documentation

## 2014-08-01 DIAGNOSIS — I2589 Other forms of chronic ischemic heart disease: Secondary | ICD-10-CM | POA: Insufficient documentation

## 2014-08-01 DIAGNOSIS — Z95810 Presence of automatic (implantable) cardiac defibrillator: Secondary | ICD-10-CM

## 2014-08-01 DIAGNOSIS — I209 Angina pectoris, unspecified: Secondary | ICD-10-CM

## 2014-08-01 DIAGNOSIS — Z7902 Long term (current) use of antithrombotics/antiplatelets: Secondary | ICD-10-CM | POA: Insufficient documentation

## 2014-08-01 DIAGNOSIS — Z7982 Long term (current) use of aspirin: Secondary | ICD-10-CM | POA: Insufficient documentation

## 2014-08-01 DIAGNOSIS — I509 Heart failure, unspecified: Secondary | ICD-10-CM | POA: Diagnosis not present

## 2014-08-01 DIAGNOSIS — I2582 Chronic total occlusion of coronary artery: Secondary | ICD-10-CM | POA: Diagnosis not present

## 2014-08-01 DIAGNOSIS — I25709 Atherosclerosis of coronary artery bypass graft(s), unspecified, with unspecified angina pectoris: Secondary | ICD-10-CM

## 2014-08-01 DIAGNOSIS — I25810 Atherosclerosis of coronary artery bypass graft(s) without angina pectoris: Secondary | ICD-10-CM | POA: Diagnosis not present

## 2014-08-01 DIAGNOSIS — I5022 Chronic systolic (congestive) heart failure: Secondary | ICD-10-CM | POA: Diagnosis not present

## 2014-08-01 DIAGNOSIS — R0602 Shortness of breath: Secondary | ICD-10-CM

## 2014-08-01 DIAGNOSIS — K219 Gastro-esophageal reflux disease without esophagitis: Secondary | ICD-10-CM | POA: Diagnosis not present

## 2014-08-01 DIAGNOSIS — R002 Palpitations: Secondary | ICD-10-CM

## 2014-08-01 DIAGNOSIS — Z794 Long term (current) use of insulin: Secondary | ICD-10-CM | POA: Diagnosis not present

## 2014-08-01 HISTORY — PX: LEFT HEART CATHETERIZATION WITH CORONARY ANGIOGRAM: SHX5451

## 2014-08-01 LAB — MDC_IDC_ENUM_SESS_TYPE_INCLINIC
Battery Remaining Longevity: 138 mo
Date Time Interrogation Session: 20150910163100
HIGH POWER IMPEDANCE MEASURED VALUE: 58 Ohm
Lead Channel Impedance Value: 474 Ohm
Lead Channel Pacing Threshold Amplitude: 1 V
Lead Channel Pacing Threshold Pulse Width: 0.4 ms
Lead Channel Setting Pacing Pulse Width: 0.4 ms
Lead Channel Setting Sensing Sensitivity: 0.4 mV
MDC IDC MSMT BATTERY REMAINING PERCENTAGE: 100 %
MDC IDC PG SERIAL: 118170
MDC IDC SET LEADCHNL RV PACING AMPLITUDE: 2.4 V
MDC IDC SET ZONE DETECTION INTERVAL: 273 ms
MDC IDC SET ZONE DETECTION INTERVAL: 364 ms
MDC IDC STAT BRADY RV PERCENT PACED: 0 %
Zone Setting Detection Interval: 308 ms

## 2014-08-01 LAB — POCT ACTIVATED CLOTTING TIME: ACTIVATED CLOTTING TIME: 450 s

## 2014-08-01 LAB — POCT I-STAT, CHEM 8
BUN: 3 mg/dL — ABNORMAL LOW (ref 6–23)
Calcium, Ion: 1.14 mmol/L (ref 1.13–1.30)
Chloride: 104 mEq/L (ref 96–112)
Creatinine, Ser: 0.8 mg/dL (ref 0.50–1.35)
Glucose, Bld: 115 mg/dL — ABNORMAL HIGH (ref 70–99)
HEMATOCRIT: 38 % — AB (ref 39.0–52.0)
Hemoglobin: 12.9 g/dL — ABNORMAL LOW (ref 13.0–17.0)
POTASSIUM: 2.8 meq/L — AB (ref 3.7–5.3)
Sodium: 139 mEq/L (ref 137–147)
TCO2: 21 mmol/L (ref 0–100)

## 2014-08-01 LAB — PROTIME-INR
INR: 1.31 (ref 0.00–1.49)
Prothrombin Time: 16.3 seconds — ABNORMAL HIGH (ref 11.6–15.2)

## 2014-08-01 LAB — GLUCOSE, CAPILLARY
Glucose-Capillary: 139 mg/dL — ABNORMAL HIGH (ref 70–99)
Glucose-Capillary: 94 mg/dL (ref 70–99)

## 2014-08-01 SURGERY — LEFT HEART CATHETERIZATION WITH CORONARY ANGIOGRAM
Anesthesia: LOCAL

## 2014-08-01 MED ORDER — ASPIRIN 81 MG PO CHEW
CHEWABLE_TABLET | ORAL | Status: AC
  Filled 2014-08-01: qty 1

## 2014-08-01 MED ORDER — ACETAMINOPHEN 325 MG PO TABS: 650.0000 mg | ORAL_TABLET | ORAL | Status: DC | PRN

## 2014-08-01 MED ORDER — SODIUM CHLORIDE 0.9 % IV SOLN: 1.0000 mL/kg/h | INTRAVENOUS | Status: DC

## 2014-08-01 MED ORDER — FENTANYL CITRATE 0.05 MG/ML IJ SOLN
INTRAMUSCULAR | Status: AC
  Filled 2014-08-01: qty 2

## 2014-08-01 MED ORDER — ASPIRIN 81 MG PO CHEW
81.0000 mg | CHEWABLE_TABLET | ORAL | Status: AC
  Administered 2014-08-01: 81 mg via ORAL

## 2014-08-01 MED ORDER — HEPARIN (PORCINE) IN NACL 2-0.9 UNIT/ML-% IJ SOLN
INTRAMUSCULAR | Status: AC
  Filled 2014-08-01: qty 500

## 2014-08-01 MED ORDER — SODIUM CHLORIDE 0.9 % IV SOLN
250.0000 mL | INTRAVENOUS | Status: DC | PRN
  Administered 2014-08-01: 250 mL via INTRAVENOUS

## 2014-08-01 MED ORDER — MIDAZOLAM HCL 2 MG/2ML IJ SOLN
INTRAMUSCULAR | Status: AC
  Filled 2014-08-01: qty 2

## 2014-08-01 MED ORDER — SODIUM CHLORIDE 0.9 % IJ SOLN: 3.0000 mL | Freq: Two times a day (BID) | INTRAMUSCULAR | Status: DC

## 2014-08-01 MED ORDER — VERAPAMIL HCL 2.5 MG/ML IV SOLN
INTRAVENOUS | Status: AC
  Filled 2014-08-01: qty 2

## 2014-08-01 MED ORDER — HEPARIN SODIUM (PORCINE) 1000 UNIT/ML IJ SOLN
INTRAMUSCULAR | Status: AC
  Filled 2014-08-01: qty 1

## 2014-08-01 MED ORDER — LIDOCAINE HCL (PF) 1 % IJ SOLN
INTRAMUSCULAR | Status: AC
  Filled 2014-08-01: qty 30

## 2014-08-01 MED ORDER — OXYCODONE-ACETAMINOPHEN 5-325 MG PO TABS
1.0000 | ORAL_TABLET | ORAL | Status: DC | PRN
  Administered 2014-08-01: 2 via ORAL

## 2014-08-01 MED ORDER — SODIUM CHLORIDE 0.9 % IJ SOLN: 3.0000 mL | INTRAMUSCULAR | Status: DC | PRN

## 2014-08-01 MED ORDER — ONDANSETRON HCL 4 MG/2ML IJ SOLN: 4.0000 mg | Freq: Four times a day (QID) | INTRAMUSCULAR | Status: DC | PRN

## 2014-08-01 MED ORDER — OXYCODONE-ACETAMINOPHEN 5-325 MG PO TABS
ORAL_TABLET | ORAL | Status: AC
  Administered 2014-08-01: 2 via ORAL
  Filled 2014-08-01: qty 2

## 2014-08-01 NOTE — Discharge Instructions (Signed)
Radial Site Care °Refer to this sheet in the next few weeks. These instructions provide you with information on caring for yourself after your procedure. Your caregiver may also give you more specific instructions. Your treatment has been planned according to current medical practices, but problems sometimes occur. Call your caregiver if you have any problems or questions after your procedure. °HOME CARE INSTRUCTIONS °· You may shower the day after the procedure. Remove the bandage (dressing) and gently wash the site with plain soap and water. Gently pat the site dry. °· Do not apply powder or lotion to the site. °· Do not submerge the affected site in water for 3 to 5 days. °· Inspect the site at least twice daily. °· Do not flex or bend the affected arm for 24 hours. °· No lifting over 5 pounds (2.3 kg) for 5 days after your procedure. °· Do not drive home if you are discharged the same day of the procedure. Have someone else drive you. °· You may drive 24 hours after the procedure unless otherwise instructed by your caregiver. °· Do not operate machinery or power tools for 24 hours. °· A responsible adult should be with you for the first 24 hours after you arrive home. °What to expect: °· Any bruising will usually fade within 1 to 2 weeks. °· Blood that collects in the tissue (hematoma) may be painful to the touch. It should usually decrease in size and tenderness within 1 to 2 weeks. °SEEK IMMEDIATE MEDICAL CARE IF: °· You have unusual pain at the radial site. °· You have redness, warmth, swelling, or pain at the radial site. °· You have drainage (other than a small amount of blood on the dressing). °· You have chills. °· You have a fever or persistent symptoms for more than 72 hours. °· You have a fever and your symptoms suddenly get worse. °· Your arm becomes pale, cool, tingly, or numb. °· You have heavy bleeding from the site. Hold pressure on the site. °Document Released: 12/10/2010 Document Revised:  01/30/2012 Document Reviewed: 12/10/2010 °ExitCare® Patient Information ©2015 [employer]. This information is not intended to replace advice given to you by your health care provider. Make sure you discuss any questions you have with your health care provider. ° °

## 2014-08-01 NOTE — Interval H&P Note (Signed)
Cath Lab Visit (complete for each Cath Lab visit)  Clinical Evaluation Leading to the Procedure:   ACS: No.  Non-ACS:    Anginal Classification: CCS III  Anti-ischemic medical therapy: Maximal Therapy (2 or more classes of medications)  Non-Invasive Test Results: No non-invasive testing performed  Prior CABG: Previous CABG      History and Physical Interval Note:  08/01/2014 1:02 PM  Victor [name]  has presented today for surgery, with the diagnosis of cp/shortness of breath  The various methods of treatment have been discussed with the patient and family. After consideration of risks, benefits and other options for treatment, the patient has consented to  Procedure(s): LEFT HEART CATHETERIZATION WITH CORONARY ANGIOGRAM (N/A) as a surgical intervention .  The patient's history has been reviewed, patient examined, no change in status, stable for surgery.  I have reviewed the patient's chart and labs.  Questions were answered to the patient's satisfaction.     Sinclair Grooms

## 2014-08-01 NOTE — H&P (View-Only) (Signed)
HPI [name] returns today for followup. He is a pleasant 78 yo man with an ICM, chronic systolic heart failure, VT, s/p ICD implant. In the past several months, the patient has had worsening sob and episodes of chest pressure. He underwent stress testing several months ago and had a high risk scan with both inferolateral scar and ischemia. He had undergone PCI of a 90% SVG to OM3 lesion approximately 18 months ago. He has not had any ICD therapies. When I saw the patient 4 months ago, I recommended repeating his heart cath as I was concerned he had restenosis of his PCI to SVG. He cancelled his procedure. He feels terrible and has dyspnea with exertion as well as exertional chest pressure.  Allergies  Allergen Reactions  . Coreg [Carvedilol] Other (See Comments)    Shortness of breath ,fatigue ,dizzyness   . Lovastatin Other (See Comments)    CK elevation, Myalgias     Current Outpatient Prescriptions  Medication Sig Dispense Refill  . aspirin (ASPIR-81) 81 MG EC tablet Take 81 mg by mouth at bedtime.       . clopidogrel (PLAVIX) 75 MG tablet TAKE 1 TABLET EVERY DAY WITH BREAKFAST  90 tablet  1  . etodolac (LODINE) 400 MG tablet Take 400 mg by mouth at bedtime.       . furosemide (LASIX) 20 MG tablet Take 2 tablets daily  60 tablet  1  . HYDROcodone-acetaminophen (LORCET) 5-325 MG per tablet Take 1 tablet by mouth every 6 (six) hours as needed for pain.      Marland Kitchen insulin aspart protamine- aspart (NOVOLOG MIX 70/30) (70-30) 100 UNIT/ML injection Inject 25 units in the morning and 10-12 units in the evening      . lisinopril (PRINIVIL,ZESTRIL) 2.5 MG tablet TAKE 1 TABLET BY MOUTH DAILY  90 tablet  1  . metaxalone (SKELAXIN) 800 MG tablet Take 800 mg by mouth 2 (two) times daily.        . metFORMIN (GLUCOPHAGE) 1000 MG tablet Take 1,000 mg by mouth 2 (two) times daily with a meal.      . nitroGLYCERIN (NITROSTAT) 0.4 MG SL tablet Place 1 tablet (0.4 mg total) under the tongue every 5  (five) minutes x 3 doses as needed for chest pain.  30 tablet  12  . potassium chloride (KLOR-CON) 20 MEQ packet Take 20 mEq by mouth every other day.        . pravastatin (PRAVACHOL) 40 MG tablet Take 40 mg by mouth at bedtime.       . pregabalin (LYRICA) 75 MG capsule Take 150 mg by mouth daily.       . vitamin B-12 (CYANOCOBALAMIN) 1000 MCG tablet Take 1,000 mcg by mouth daily.       No current facility-administered medications for this visit.     Past Medical History  Diagnosis Date  . Arthropathy, unspecified, site unspecified   . Occlusion and stenosis of carotid artery without mention of cerebral infarction   . Raynaud's syndrome   . Congestive heart failure, unspecified   . Paroxysmal ventricular tachycardia   . Other specified forms of chronic ischemic heart disease   . Peripheral vascular disease   . Cardiomyopathy, ischemic   . Automatic implantable cardioverter-defibrillator in situ   . High cholesterol   . Myocardial infarction 1990    "4-5 while in [facility] til they put in ICD" (04/24/2013)  . Shortness of breath     "both  exertion and lying down" (04/24/2013)  . GERD (gastroesophageal reflux disease)   . Osteoarthrosis, unspecified whether generalized or localized, unspecified site   . Degenerative joint disease   . Arthritis     "back and hands" (04/24/2013)  . Chronic lower back pain   . Coronary artery disease   . Type II or unspecified type diabetes mellitus without mention of complication, not stated as uncontrolled     insulin dependent    ROS:   All systems reviewed and negative except as noted in the HPI.   Past Surgical History  Procedure Laterality Date  . Anal fistulectomy  2000's  . Cataract extraction, bilateral Bilateral ~ 2009  . Coronary artery bypass graft  01/1989    "CABG X3" 96/02/2013)  . Cardiac defibrillator placement  02/1989-12/2011    "I've had a total of 6 or 7 defibrillators put in" (04/24/2013)  . Finger fracture surgery Left ~  2000    "pointer" (04/24/2013)     Family History  Problem Relation Age of Onset  . Heart failure Mother 2  . Heart attack Father   . Leukemia Sister   . Stomach cancer Maternal Grandfather      History   Social History  . Marital Status: Married    Spouse Name: N/A    Number of Children: N/A  . Years of Education: N/A   Occupational History  . Not on file.   Social History Main Topics  . Smoking status: Never Smoker   . Smokeless tobacco: Never Used  . Alcohol Use: Yes     Comment: 04/24/2013 "wine w/dinner 15-20 yr ago"  . Drug Use: No  . Sexual Activity: Not on file   Other Topics Concern  . 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      BP 144/62  Pulse 67  Ht 5\' 8"  (1.727 m)  Wt 178 lb 3.2 oz (80.831 kg)  BMI 27.10 kg/m2  Physical Exam:  Stable but chronically ill appearing NAD HEENT: Unremarkable Neck:  No JVD, no thyromegally Back:  No CVA tenderness Lungs:  Clear except for basilar rales. HEART:  Regular rate rhythm, no murmurs, no rubs, no clicks Abd:  soft, positive bowel sounds, no organomegally, no rebound, no guarding Ext:  2 plus pulses, no edema, no cyanosis, no clubbing Skin:  No rashes no nodules Neuro:  CN II through XII intact, motor grossly intact  DEVICE  Normal device function.  See PaceArt for details.   Assess/Plan:

## 2014-08-01 NOTE — CV Procedure (Addendum)
     Left Heart Catheterization with Coronary and Bypass Angiography Report  LAKSHYA MCGILLICUDDY  78 y.o.  male 09/24/1936  Procedure Date: 08/01/2014 Referring Physician: Cristopher Peru, M.D. Primary Cardiologist:: Cristopher Peru, M.D.  INDICATIONS: Chronic systolic heart failure with class III-IV symptomatology that includes angina. The remaining saphenous vein graft was stented in 2014. The studies being done to rule out restenosis.  PROCEDURE: 1. Left heart catheterization; 2. Coronary angiography; 3. Left ventriculography; 4. Bypass graft angiography  CONSENT:  The risks, benefits, and details of the procedure were explained in detail to the patient. Risks including death, stroke, heart attack, kidney injury, allergy, limb ischemia, bleeding and radiation injury were discussed.  The patient verbalized understanding and wanted to proceed.  Informed written consent was obtained.  PROCEDURE TECHNIQUE:  After Xylocaine anesthesia a 5 French Slender sheath was placed in the right radial artery with an angiocath and the modified Seldinger technique.  Coronary angiography was done using a 5 F  JR 4, 3.5 cm JL, and AL-1 diagnostic catheter.  Left ventriculography was done using the JR 4 catheter and hand injection.   Hemostasis was achieved with a radial wrist band after the images were reviewed.   CONTRAST:  Total of 140 cc.  COMPLICATIONS:  None   HEMODYNAMICS:  Aortic pressure 126/62; LV pressure 130/12 mmHg; LVEDP 22 mm mercury  ANGIOGRAPHIC DATA:   The left main coronary artery is calcified but patent.  The left anterior descending artery is transapical. The proximal vessel contains eccentric 30-50% stenosis. 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 Proximal to the aneurysm is a first diagonal that contains ostial 85% stenosis. The vessel is moderate in size. The second diagonal is large and free of obstruction. No significant LAD obstruction is noted.  The left  circumflex artery is severely diseased. The first marginal is totally occluded. The circumflex then becomes totally occluded after the left atrial recurrent branch arises in the midsegment..  The right coronary artery is totally occluded proximally. Right to right collaterals fill the distal right coronary.Marland Kitchen  BYPASS GRAFT ANGIOGRAPHY: Saphenous vein graft to the circumflex is totally occluded within the previously stented proximal region.  Saphenous vein graft to the right coronary is totally occluded   LEFT VENTRICULOGRAM:  Left ventricular angiogram was done in the 30 RAO projection and revealed a dilated left ventricle with severe global hypokinesis and an estimated ejection fraction of 25%.   IMPRESSIONS:  1. Severe native vessel coronary disease with total occlusion of the mid circumflex, total occlusion of the proximal RCA, high-grade obstruction in the first diagonal. This anatomy is not significantly different than that from catheterization in 2014. 2. 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 This includes what appears to be chronic occlusion of the stent within the saphenous vein graft to the circumflex that was placed in 2013. 3. Ischemic cardiomyopathy with chronic systolic heart failure, elevated end-diastolic pressures, and ejection fraction of 25%   RECOMMENDATION:  There no interventional options. Medical therapy appears to be the prudent approach. More aggressive volume removal may be helpful. The ligament of the chronically occluded right coronary, that is a possibility that CTO intervention could be done. This should be discussed with our team if intensification of anti-ischemic therapy does not help resolve symptoms.Marland Kitchen

## 2014-08-04 ENCOUNTER — Encounter: Payer: Self-pay | Admitting: Internal Medicine

## 2014-08-11 ENCOUNTER — Encounter: Payer: Self-pay | Admitting: Internal Medicine

## 2014-08-11 ENCOUNTER — Ambulatory Visit (INDEPENDENT_AMBULATORY_CARE_PROVIDER_SITE_OTHER): Payer: Medicare Other | Admitting: Internal Medicine

## 2014-08-11 VITALS — BP 144/70 | HR 71 | Ht 68.0 in | Wt 181.0 lb

## 2014-08-11 DIAGNOSIS — I2589 Other forms of chronic ischemic heart disease: Secondary | ICD-10-CM

## 2014-08-11 DIAGNOSIS — Z9889 Other specified postprocedural states: Secondary | ICD-10-CM | POA: Diagnosis not present

## 2014-08-11 DIAGNOSIS — I5022 Chronic systolic (congestive) heart failure: Secondary | ICD-10-CM | POA: Diagnosis not present

## 2014-08-11 MED ORDER — BISOPROLOL FUMARATE 10 MG PO TABS: 15.0000 mg | ORAL_TABLET | Freq: Every day | ORAL | Status: DC

## 2014-08-11 MED ORDER — FUROSEMIDE 40 MG PO TABS: 60.0000 mg | ORAL_TABLET | Freq: Two times a day (BID) | ORAL | Status: DC

## 2014-08-11 MED ORDER — ISOSORBIDE MONONITRATE ER 30 MG PO TB24: 15.0000 mg | ORAL_TABLET | Freq: Every day | ORAL | Status: DC

## 2014-08-11 NOTE — Progress Notes (Signed)
HPI [name] returns today for followup. He is a pleasant 78 yo man with an ICM, chronic systolic heart failure, VT, s/p ICD implant. In the past several months, the patient has had worsening sob and episodes of chest pressure. He underwent stress testing several months ago and had a high risk scan with both inferolateral scar and ischemia. He had undergone PCI of a 90% SVG to OM3 lesion approximately 18 months ago. He has not had any ICD therapies. When I saw the patient 4 months ago, I recommended repeating his heart cath as I was concerned he had restenosis of his PCI to SVG. He finally underwent catheterization several weeks ago and this demonstrated the previously stented vein to the OM3 was occluded. In the interim he has been stable but has class 2B symptoms, mostly sob (not chest pressure). Previously he has been reluctant to avoid sodium and he still admits to sodium indiscretion. He has had trouble with beta blockers as well as other anti-anginals.  Allergies  Allergen Reactions  . Coreg [Carvedilol] Other (See Comments)    Shortness of breath ,fatigue ,dizzyness   . Lovastatin Other (See Comments)    CK elevation, Myalgias     Current Outpatient Prescriptions  Medication Sig Dispense Refill  . aspirin (ASPIR-81) 81 MG EC tablet Take 81 mg by mouth at bedtime.       . clopidogrel (PLAVIX) 75 MG tablet Take 75 mg by mouth daily.      Marland Kitchen etodolac (LODINE) 400 MG tablet Take 400 mg by mouth at bedtime.       . furosemide (LASIX) 20 MG tablet Take 40 mg by mouth 2 (two) times daily.       Marland Kitchen HYDROcodone-acetaminophen (LORCET) 5-325 MG per tablet Take 1 tablet by mouth every 6 (six) hours as needed for pain.      Marland Kitchen insulin aspart protamine- aspart (NOVOLOG MIX 70/30) (70-30) 100 UNIT/ML injection Inject 25 units in the morning and 10-12 units in the evening      . lisinopril (PRINIVIL,ZESTRIL) 2.5 MG tablet Take 2.5 mg by mouth daily.      . metaxalone (SKELAXIN) 800 MG tablet Take  800 mg by mouth 2 (two) times daily.        . metFORMIN (GLUCOPHAGE) 1000 MG tablet Take 1,000 mg by mouth 2 (two) times daily with a meal.      . nitroGLYCERIN (NITROSTAT) 0.4 MG SL tablet Place 1 tablet (0.4 mg total) under the tongue every 5 (five) minutes x 3 doses as needed for chest pain.  30 tablet  12  . potassium chloride (KLOR-CON) 20 MEQ packet Take 20 mEq by mouth every other day.        . pravastatin (PRAVACHOL) 40 MG tablet Take 40 mg by mouth at bedtime.       . pregabalin (LYRICA) 75 MG capsule Take 150 mg by mouth daily.       . vitamin B-12 (CYANOCOBALAMIN) 1000 MCG tablet Take 1,000 mcg by mouth daily.       No current facility-administered medications for this visit.     Past Medical History  Diagnosis Date  . Arthropathy, unspecified, site unspecified   . Occlusion and stenosis of carotid artery without mention of cerebral infarction   . Raynaud's syndrome   . Congestive heart failure, unspecified   . Paroxysmal ventricular tachycardia   . Other specified forms of chronic ischemic heart disease   . Peripheral vascular disease   .  Cardiomyopathy, ischemic   . Automatic implantable cardioverter-defibrillator in situ   . High cholesterol   . Myocardial infarction 1990    "4-5 while in [facility] til they put in ICD" (04/24/2013)  . Shortness of breath     "both exertion and lying down" (04/24/2013)  . GERD (gastroesophageal reflux disease)   . Osteoarthrosis, unspecified whether generalized or localized, unspecified site   . Degenerative joint disease   . Arthritis     "back and hands" (04/24/2013)  . Chronic lower back pain   . Coronary artery disease   . Type II or unspecified type diabetes mellitus without mention of complication, not stated as uncontrolled     insulin dependent    ROS:   All systems reviewed and negative except as noted in the HPI.   Past Surgical History  Procedure Laterality Date  . Anal fistulectomy  2000's  . Cataract extraction,  bilateral Bilateral ~ 2009  . Coronary artery bypass graft  01/1989    "CABG X3" 96/02/2013)  . Cardiac defibrillator placement  02/1989-12/2011    "I've had a total of 6 or 7 defibrillators put in" (04/24/2013)  . Finger fracture surgery Left ~ 2000    "pointer" (04/24/2013)     Family History  Problem Relation Age of Onset  . Heart failure Mother 19  . Heart attack Father   . Leukemia Sister   . Stomach cancer Maternal Grandfather      History   Social History  . Marital Status: Married    Spouse Name: N/A    Number of Children: N/A  . Years of Education: N/A   Occupational History  . Not on file.   Social History Main Topics  . Smoking status: Never Smoker   . Smokeless tobacco: Never Used  . Alcohol Use: Yes     Comment: 04/24/2013 "wine w/dinner 15-20 yr ago"  . Drug Use: No  . Sexual Activity: Not on file   Other Topics Concern  . 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      BP 144/70  Pulse 71  Ht 5\' 8"  (1.727 m)  Wt 181 lb (82.101 kg)  BMI 27.53 kg/m2  Physical Exam:  Stable but chronically ill appearing NAD HEENT: Unremarkable Neck:  No JVD, no thyromegally Back:  No CVA tenderness Lungs:  Clear except for basilar rales. HEART:  Regular rate rhythm, no murmurs, no rubs, no clicks, soft S4. Abd:  soft, positive bowel sounds, no organomegally, no rebound, no guarding Ext:  2 plus pulses, no edema, no cyanosis, no clubbing Skin:  No rashes no nodules Neuro:  CN II through XII intact, motor grossly intact   Assess/Plan:

## 2014-08-11 NOTE — Patient Instructions (Addendum)
Your physician has recommended you make the following change in your medication:  1) START Bisoprolol 2.5 mg daily 2) START Imdur 15 mg daily 3) INCREASE Furosemide to 60 mg twice daily   Your physician recommends that you schedule a follow-up appointment in: 4-6 weeks with Dr. Lovena Le

## 2014-08-11 NOTE — Assessment & Plan Note (Signed)
His symptoms are class 2-3. I have asked the patient to start low dose Imdur and bisoprolol. He will also increase his dose of lasix.

## 2014-08-11 NOTE — Assessment & Plan Note (Signed)
I will review with my interventional partners. We will discuss the possibility of treatment of one of his chronic total occlusions.

## 2014-08-15 DIAGNOSIS — M5412 Radiculopathy, cervical region: Secondary | ICD-10-CM | POA: Diagnosis not present

## 2014-08-18 ENCOUNTER — Telehealth: Payer: Self-pay | Admitting: *Deleted

## 2014-08-18 DIAGNOSIS — I2589 Other forms of chronic ischemic heart disease: Secondary | ICD-10-CM

## 2014-08-18 NOTE — Telephone Encounter (Addendum)
This is a patient of Dr. Tanna Furry.  Dr. Caryl Comes has not recently seen this patient (one time in 2014).  Patient's Potassium was not changed at the last office visit. I will forward this to Dr. Tanna Furry nurse to review with him if needed.

## 2014-08-18 NOTE — Telephone Encounter (Signed)
Patients wife, Jan called and stated that the potassium was changed to qd at the last ov with Dr Caryl Comes. There is no documentation of this. Please advise. Thanks, MI

## 2014-08-19 ENCOUNTER — Other Ambulatory Visit (HOSPITAL_COMMUNITY): Payer: Self-pay | Admitting: Adult Health

## 2014-08-20 MED ORDER — POTASSIUM CHLORIDE 20 MEQ PO PACK: 20.0000 meq | PACK | Freq: Every day | ORAL | Status: DC

## 2014-08-20 NOTE — Telephone Encounter (Signed)
He had previosly been on Potassium every other day as prescribed by his PCP.  Dr Lovena Le recently incresed his fluid pill and has asked that he take Potassium daily.  They are also requesting NTG to refilled

## 2014-09-11 ENCOUNTER — Other Ambulatory Visit (HOSPITAL_COMMUNITY): Payer: Self-pay | Admitting: Internal Medicine

## 2014-09-12 DIAGNOSIS — M5030 Other cervical disc degeneration, unspecified cervical region: Secondary | ICD-10-CM | POA: Diagnosis not present

## 2014-09-16 ENCOUNTER — Ambulatory Visit (INDEPENDENT_AMBULATORY_CARE_PROVIDER_SITE_OTHER): Payer: Medicare Other | Admitting: Internal Medicine

## 2014-09-16 ENCOUNTER — Encounter: Payer: Self-pay | Admitting: Internal Medicine

## 2014-09-16 VITALS — BP 102/64 | HR 68 | Ht 68.0 in | Wt 185.8 lb

## 2014-09-16 DIAGNOSIS — I472 Ventricular tachycardia: Secondary | ICD-10-CM

## 2014-09-16 DIAGNOSIS — I209 Angina pectoris, unspecified: Secondary | ICD-10-CM | POA: Diagnosis not present

## 2014-09-16 DIAGNOSIS — I2589 Other forms of chronic ischemic heart disease: Secondary | ICD-10-CM

## 2014-09-16 DIAGNOSIS — I5022 Chronic systolic (congestive) heart failure: Secondary | ICD-10-CM

## 2014-09-16 DIAGNOSIS — I25709 Atherosclerosis of coronary artery bypass graft(s), unspecified, with unspecified angina pectoris: Secondary | ICD-10-CM

## 2014-09-16 DIAGNOSIS — I4729 Other ventricular tachycardia: Secondary | ICD-10-CM

## 2014-09-16 DIAGNOSIS — I255 Ischemic cardiomyopathy: Secondary | ICD-10-CM

## 2014-09-16 MED ORDER — NITROGLYCERIN 0.4 MG SL SUBL
SUBLINGUAL_TABLET | SUBLINGUAL | Status: DC
Start: 2014-09-16 — End: 2014-09-23

## 2014-09-16 MED ORDER — ISOSORBIDE MONONITRATE ER 30 MG PO TB24: 15.0000 mg | ORAL_TABLET | Freq: Every day | ORAL | Status: DC

## 2014-09-16 MED ORDER — BISOPROLOL FUMARATE 10 MG PO TABS: 15.0000 mg | ORAL_TABLET | Freq: Every day | ORAL | Status: DC

## 2014-09-16 NOTE — Assessment & Plan Note (Signed)
He has had no recurrent ventricular arrhythmias. He will continue his current medical therapy.

## 2014-09-16 NOTE — Progress Notes (Signed)
HPI Victor [name] returns today for followup. He is a pleasant 78 yo man with an ICM, chronic systolic heart failure, VT, s/p ICD implant. In the past several months, the patient has had worsening sob and episodes of chest pressure. He underwent stress testing several months ago and had a high risk scan with both inferolateral scar and ischemia. He had undergone PCI of a 90% SVG to OM3 lesion approximately 18 months ago. He has not had any ICD therapies. When I saw the patient 4 months ago, I recommended repeating his heart cath as I was concerned he had restenosis of his PCI to SVG. He finally underwent catheterization several weeks ago and this demonstrated the previously stented vein to the OM3 was occluded. In the interim he has been stable but has class 2B symptoms, mostly sob (not chest pressure). Previously he has been reluctant to avoid sodium and he still admits to sodium indiscretion although he is a bit better on this. He has had trouble with beta blockers as well as other anti-anginals. When I last saw the patient, I started bisoprolol and Imdur. He appears to be tolerating his medications. I also last the patient takes sublingual nitroglycerin if he had an episode of sudden shortness of breath. The nitroglycerin has also helped. It appears that shortness of breath is his anginal equivalent. Allergies  Allergen Reactions  . Coreg [Carvedilol] Other (See Comments)    Shortness of breath ,fatigue ,dizzyness   . Lovastatin Other (See Comments)    CK elevation, Myalgias     Current Outpatient Prescriptions  Medication Sig Dispense Refill  . aspirin (ASPIR-81) 81 MG EC tablet Take 81 mg by mouth at bedtime.       . bisoprolol (ZEBETA) 10 MG tablet Take 1.5 tablets (15 mg total) by mouth daily.  45 tablet  2  . clopidogrel (PLAVIX) 75 MG tablet Take 75 mg by mouth daily.      Marland Kitchen etodolac (LODINE) 400 MG tablet Take 400 mg by mouth at bedtime.       . furosemide (LASIX) 40 MG tablet Take  1.5 tablets (60 mg total) by mouth 2 (two) times daily.  270 tablet  3  . HYDROcodone-acetaminophen (LORCET) 5-325 MG per tablet Take 1 tablet by mouth every 6 (six) hours as needed for pain.      Marland Kitchen insulin aspart protamine- aspart (NOVOLOG MIX 70/30) (70-30) 100 UNIT/ML injection Inject 30 units in the morning and 30 units in the evening      . isosorbide mononitrate (IMDUR) 30 MG 24 hr tablet Take 0.5 tablets (15 mg total) by mouth daily.  15 tablet  2  . lisinopril (PRINIVIL,ZESTRIL) 2.5 MG tablet Take 2.5 mg by mouth daily.      . metaxalone (SKELAXIN) 800 MG tablet Take 800 mg by mouth 2 (two) times daily.        . metFORMIN (GLUCOPHAGE) 1000 MG tablet Take 1,000 mg by mouth 2 (two) times daily with a meal.      . NITROSTAT 0.4 MG SL tablet DISSOLVE 1 TABLET UNDER TONGUE EVERY 5 MINS FOR UP TO 3 DOSES AS NEEDED FOR CHEST PAIN  25 tablet  3  . potassium chloride (KLOR-CON) 20 MEQ packet Take 20 mEq by mouth daily.  90 tablet  3  . pravastatin (PRAVACHOL) 40 MG tablet Take 40 mg by mouth at bedtime.       . pregabalin (LYRICA) 75 MG capsule Take 150 mg by mouth  daily.       . vitamin B-12 (CYANOCOBALAMIN) 1000 MCG tablet Take 1,000 mcg by mouth daily.       No current facility-administered medications for this visit.     Past Medical History  Diagnosis Date  . Arthropathy, unspecified, site unspecified   . Occlusion and stenosis of carotid artery without mention of cerebral infarction   . Raynaud's syndrome   . Congestive heart failure, unspecified   . Paroxysmal ventricular tachycardia   . Other specified forms of chronic ischemic heart disease   . Peripheral vascular disease   . Cardiomyopathy, ischemic   . Automatic implantable cardioverter-defibrillator in situ   . High cholesterol   . Myocardial infarction 1990    "4-5 while in [facility] til they put in ICD" (04/24/2013)  . Shortness of breath     "both exertion and lying down" (04/24/2013)  . GERD (gastroesophageal reflux  disease)   . Osteoarthrosis, unspecified whether generalized or localized, unspecified site   . Degenerative joint disease   . Arthritis     "back and hands" (04/24/2013)  . Chronic lower back pain   . Coronary artery disease   . Type II or unspecified type diabetes mellitus without mention of complication, not stated as uncontrolled     insulin dependent    ROS:   All systems reviewed and negative except as noted in the HPI.   Past Surgical History  Procedure Laterality Date  . Anal fistulectomy  2000's  . Cataract extraction, bilateral Bilateral ~ 2009  . Coronary artery bypass graft  01/1989    "CABG X3" 96/02/2013)  . Cardiac defibrillator placement  02/1989-12/2011    "I've had a total of 6 or 7 defibrillators put in" (04/24/2013)  . Finger fracture surgery Left ~ 2000    "pointer" (04/24/2013)     Family History  Problem Relation Age of Onset  . Heart failure Mother 70  . Heart attack Father   . Leukemia Sister   . Stomach cancer Maternal Grandfather      History   Social History  . Marital Status: Married    Spouse Name: N/A    Number of Children: N/A  . Years of Education: N/A   Occupational History  . Not on file.   Social History Main Topics  . Smoking status: Never Smoker   . Smokeless tobacco: Never Used  . Alcohol Use: Yes     Comment: 04/24/2013 "wine w/dinner 15-20 yr ago"  . Drug Use: No  . Sexual Activity: Not on file   Other Topics Concern  . 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      BP 102/64  Pulse 68  Ht 5\' 8"  (1.727 m)  Wt 185 lb 12.8 oz (84.278 kg)  BMI 28.26 kg/m2  Physical Exam:  Stable but chronically ill appearing NAD HEENT: Unremarkable Neck:  No JVD, no thyromegally Back:  No CVA tenderness Lungs:  Clear except for basilar rales. HEART:  Regular rate rhythm, no  murmurs, no rubs, no clicks, soft S4. Abd:  soft, positive bowel sounds, no organomegally, no rebound, no guarding Ext:  2 plus pulses, no edema, no cyanosis, no clubbing Skin:  No rashes no nodules Neuro:  CN II through XII intact, motor grossly intact   Assess/Plan:

## 2014-09-16 NOTE — Assessment & Plan Note (Signed)
The patient has severe native vessel disease as well as graft disease. I reviewed the case with Dr. Peter Martinique and his films have been reviewed as well. It was Dr. Doug Sou impression, that his chronically occluded right coronary artery might be able to be opened. He has fairly extensive collaterals in the right sided circulation. I'll refer the patient to Dr. Martinique. He will continue his fairly maximal antianginal regimen.

## 2014-09-16 NOTE — Assessment & Plan Note (Signed)
His symptoms remain class IIb. No change in medical therapy. His blood pressure is a little on the soft side.

## 2014-09-16 NOTE — Patient Instructions (Signed)
Your physician recommends that you schedule a follow-up appointment in: 3 months with the device clinic.  Your physician wants you to follow-up in: 1 year with Dr. Lovena Le. You will receive a reminder letter in the mail two months in advance. If you don't receive a letter, please call our office to schedule the follow-up appointment.  You have been referred to Dr. Peter Martinique- evaluation for chronic total occlusion angioplasty (cath)

## 2014-09-17 ENCOUNTER — Encounter: Payer: Self-pay | Admitting: Cardiology

## 2014-09-17 ENCOUNTER — Other Ambulatory Visit: Payer: Self-pay | Admitting: Cardiology

## 2014-09-17 ENCOUNTER — Ambulatory Visit (INDEPENDENT_AMBULATORY_CARE_PROVIDER_SITE_OTHER): Payer: Medicare Other | Admitting: Cardiology

## 2014-09-17 VITALS — BP 112/66 | HR 84 | Ht 68.0 in | Wt 189.6 lb

## 2014-09-17 DIAGNOSIS — I25708 Atherosclerosis of coronary artery bypass graft(s), unspecified, with other forms of angina pectoris: Secondary | ICD-10-CM | POA: Diagnosis not present

## 2014-09-17 DIAGNOSIS — I5022 Chronic systolic (congestive) heart failure: Secondary | ICD-10-CM

## 2014-09-17 DIAGNOSIS — I209 Angina pectoris, unspecified: Secondary | ICD-10-CM

## 2014-09-17 DIAGNOSIS — Z95810 Presence of automatic (implantable) cardiac defibrillator: Secondary | ICD-10-CM

## 2014-09-17 DIAGNOSIS — I2589 Other forms of chronic ischemic heart disease: Secondary | ICD-10-CM

## 2014-09-17 NOTE — Progress Notes (Signed)
Victor [name] Date of Birth: [birth year] Medical Record #[id number]  History of Present Illness: Victor [name] is seen at the request of Dr. Lovena Le for consideration of CTP PCI. He is a pleasant 78 yo WM with history of chronic ischemic heart disease. He is s/p CABG x 2 in 1990 at [facility]. In June 2014 he presented with unstable angina. At that time the SVG to the RCA was occluded. There was a CTO of the native RCA. The SVG to OM had a severe stenosis and this was successfully stented. In September 2015 he presented with progressive angina and abnormal stress test. Cardiac cath at that time showed occlusion of the SVG to the OM. The OM had minimal collateral flow. Now despite maximal medical therapy he continues to have class 3-4 angina. His anginal symptoms are predominantly dyspnea and fatigue. He states he has never had chest pain. He has a history of chronic systolic CHF and is s/p ICD placement. Prior Echo was technically limited and EF could not be accurately assessed. EF by Myoview was 25%. Right heart cath in June 2014 showed fairly normal right heart and LV filling pressures.  He also has a history of DM type 2, VT, PAD with carotid disease, Hyperlipidemia, and obesity.  He states currently he has difficulty walking up any incline and has to stop frequently to rest due to SOB and fatigue. This has been worse over the past several months. His symptoms do respond to sl Ntg.     Medication List       This list is accurate as of: 09/17/14  5:26 PM.  Always use your most recent med list.               ASPIR-81 81 MG EC tablet  Generic drug:  aspirin  Take 81 mg by mouth at bedtime.     bisoprolol 10 MG tablet  Commonly known as:  ZEBETA  Take 1.5 tablets (15 mg total) by mouth daily.     clopidogrel 75 MG tablet  Commonly known as:  PLAVIX  Take 75 mg by mouth daily.     etodolac 400 MG tablet  Commonly known as:  LODINE  Take 400 mg by mouth at bedtime.     furosemide 40 MG tablet    Commonly known as:  LASIX  Take 1.5 tablets (60 mg total) by mouth 2 (two) times daily.     insulin aspart protamine- aspart (70-30) 100 UNIT/ML injection  Commonly known as:  NOVOLOG MIX 70/30  Inject 30 units in the morning and 30 units in the evening     isosorbide mononitrate 30 MG 24 hr tablet  Commonly known as:  IMDUR  Take 0.5 tablets (15 mg total) by mouth daily.     lisinopril 2.5 MG tablet  Commonly known as:  PRINIVIL,ZESTRIL  Take 2.5 mg by mouth daily.     LORCET 5-325 MG per tablet  Generic drug:  HYDROcodone-acetaminophen  Take 1 tablet by mouth every 6 (six) hours as needed for pain.     LYRICA 75 MG capsule  Generic drug:  pregabalin  Take 150 mg by mouth daily.     metFORMIN 1000 MG tablet  Commonly known as:  GLUCOPHAGE  Take 1,000 mg by mouth 2 (two) times daily with a meal.     nitroGLYCERIN 0.4 MG SL tablet  Commonly known as:  NITROSTAT  DISSOLVE 1 TABLET UNDER TONGUE EVERY 5 MINS FOR UP TO 3 DOSES AS NEEDED  FOR CHEST PAIN     potassium chloride 20 MEQ packet  Commonly known as:  KLOR-CON  Take 20 mEq by mouth daily.     pravastatin 40 MG tablet  Commonly known as:  PRAVACHOL  Take 40 mg by mouth at bedtime.     SKELAXIN 800 MG tablet  Generic drug:  metaxalone  Take 800 mg by mouth 2 (two) times daily.     vitamin B-12 1000 MCG tablet  Commonly known as:  CYANOCOBALAMIN  Take 1,000 mcg by mouth daily.         Allergies  Allergen Reactions  . Coreg [Carvedilol] Other (See Comments)    Shortness of breath ,fatigue ,dizzyness   . Lovastatin Other (See Comments)    CK elevation, Myalgias    Past Medical History  Diagnosis Date  . Arthropathy, unspecified, site unspecified   . Occlusion and stenosis of carotid artery without mention of cerebral infarction   . Raynaud's syndrome   . Congestive heart failure, unspecified   . Paroxysmal ventricular tachycardia   . Other specified forms of chronic ischemic heart disease   .  Peripheral vascular disease   . Cardiomyopathy, ischemic   . Automatic implantable cardioverter-defibrillator in situ   . High cholesterol   . Myocardial infarction 1990    "4-5 while in [facility] til they put in ICD" (04/24/2013)  . Shortness of breath     "both exertion and lying down" (04/24/2013)  . GERD (gastroesophageal reflux disease)   . Osteoarthrosis, unspecified whether generalized or localized, unspecified site   . Degenerative joint disease   . Arthritis     "back and hands" (04/24/2013)  . Chronic lower back pain   . Coronary artery disease   . Type II or unspecified type diabetes mellitus without mention of complication, not stated as uncontrolled     insulin dependent    Past Surgical History  Procedure Laterality Date  . Anal fistulectomy  2000's  . Cataract extraction, bilateral Bilateral ~ 2009  . Coronary artery bypass graft  01/1989    "CABG X3" 96/02/2013)  . Cardiac defibrillator placement  02/1989-12/2011    "I've had a total of 6 or 7 defibrillators put in" (04/24/2013)  . Finger fracture surgery Left ~ 2000    "pointer" (04/24/2013)    History   Social History  . Marital Status: Married    Spouse Name: N/A    Number of Children: N/A  . Years of Education: N/A   Social History Main Topics  . Smoking status: Never Smoker   . Smokeless tobacco: Never Used  . Alcohol Use: Yes     Comment: 04/24/2013 "wine w/dinner 15-20 yr ago"  . Drug Use: No  . Sexual Activity: None   Other Topics Concern  . 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 Has 3 daughters. 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     Family History  Problem Relation Age of Onset  . Heart failure Mother 48  . Heart attack Father   . Leukemia Sister   . Stomach cancer Maternal Grandfather     Review of Systems: As noted in HPI.  All other systems were reviewed and are negative.  Physical  Exam: BP 112/66  Pulse 84  Ht 5\' 8"  (1.727 m)  Wt 189 lb 9.6 oz (86.002 kg)  BMI 28.84 kg/m2 [employer]  09/17/14 0928  Weight: 189 lb 9.6 oz (86.002 kg)  GENERAL:  Well appearing, obese WM in NAD HEENT:  PERRL, EOMI, sclera are clear. Oropharynx is clear. NECK:  No jugular venous distention, carotid upstroke brisk and symmetric, no bruits, no thyromegaly or adenopathy LUNGS:  Clear to auscultation bilaterally CHEST:  Unremarkable HEART:  RRR,  PMI not displaced or sustained,S1 and S2 within normal limits, no S3, no S4: no clicks, no rubs, no murmurs ABD:  Soft, obese,  nontender. BS +, no masses or bruits. No hepatomegaly, no splenomegaly EXT:  2 + pulses throughout, no edema, no cyanosis no clubbing SKIN:  Warm and dry.  No rashes NEURO:  Alert and oriented x 3. Cranial nerves II through XII intact. PSYCH:  Cognitively intact    LABORATORY DATA: Lab Results  Component Value Date   WBC 10.2 07/31/2014   HGB 12.9* 08/01/2014   HCT 38.0* 08/01/2014   PLT 156.0 07/31/2014   GLUCOSE 115* 08/01/2014   ALT 16 04/24/2013   AST 19 04/24/2013   NA 139 08/01/2014   K 2.8* 08/01/2014   CL 104 08/01/2014   CREATININE 0.80 08/01/2014   BUN 3* 08/01/2014   CO2 29 07/31/2014   TSH 0.10* 07/31/2014   INR 1.31 08/01/2014   Ecg: 08/01/14: NSR with first degree AV block and PVCs. Old inferior and anterior infarcts. I have personally reviewed and interpreted this study.  Transthoracic Echocardiography  Patient: Victor [name], Victor [name] MR #: [id number] Study Date: 04/25/2013 Gender: M Age: 55 Height: 172.7cm Weight: 77.4kg BSA: 1.64m^2 Pt. Status: Room: [facility] SONOGRAPHER Diamond Nickel ORDERING Edmisten, Brooke ATTENDING Virl Axe Md cc:  ------------------------------------------------------------  ------------------------------------------------------------ Indications: Dyspnea 786.09.  ------------------------------------------------------------ History:  PMH: Ischemic cardiomyoapthy. Defibrillator. Chronic anemia. Carotid artery disease. Peripheral vascular disease. Congestive heart failure. Risk factors: Diabetes mellitus.  ------------------------------------------------------------ Study Conclusions  - Left ventricle: Extremely poor acoustic windows limit study. Overal LV systolic function appears depress, at least moderately; some images appear severe. Inferior and posterior walls appear severely hypokinetic to akinetic. The cavity size was normal. Wall thickness was normal. Doppler parameters are consistent with abnormal left ventricular relaxation (grade 1 diastolic dysfunction). - Mitral valve: Mild regurgitation. - Left atrium: The atrium was moderately dilated. Transthoracic echocardiography. M-mode, complete 2D, spectral Doppler, and color Doppler. Height: Height: 172.7cm. Height: 68in. Weight: Weight: 77.4kg. Weight: 170.3lb. Body mass index: BMI: 25.9kg/m^2. Body surface area: BSA: 1.73m^2. Blood pressure: 153/74. Patient status: Inpatient. Location: Echo laboratory.  ------------------------------------------------------------  ------------------------------------------------------------ Left ventricle: Extremely poor acoustic windows limit study. Overal LV systolic function appears depress, at least moderately; some images appear severe. Inferior and posterior walls appear severely hypokinetic to akinetic. The cavity size was normal. Wall thickness was normal. Doppler parameters are consistent with abnormal left ventricular relaxation (grade 1 diastolic dysfunction).  ------------------------------------------------------------ Aortic valve: Mildly thickened leaflets. Doppler: No regurgitation.  ------------------------------------------------------------ Mitral valve: Mildly thickened leaflets . Doppler: Mild regurgitation. Peak gradient: 44mm Hg  (D).  ------------------------------------------------------------ Left atrium: The atrium was moderately dilated.  ------------------------------------------------------------ Right ventricle: RV is not seen well enough to evaluate function. The cavity size was normal. Wall thickness was normal. Pacer wire or catheter noted in right ventricle. Systolic function was normal.  ------------------------------------------------------------ Pulmonic valve: Structurally normal valve. Cusp separation was normal. Doppler: Transvalvular velocity was within the normal range. No regurgitation.  ------------------------------------------------------------ Tricuspid valve: Poorly visualized. Doppler: Trivial regurgitation.  ------------------------------------------------------------ Right atrium: The atrium was normal in size.  ------------------------------------------------------------ Pericardium: There was no pericardial effusion.  ------------------------------------------------------------  2D measurements Normal Doppler Normal Left ventricle measurements LVID ED, 47.3 mm 43-52 Left ventricle chord, Ea, lat 5.04 cm/ ------- PLAX ann, tiss s LVID ES, 45.7 mm 23-38 DP chord, E/Ea, lat 16.75 ------- PLAX ann, tiss FS, chord, 3 % >29 DP PLAX Ea, med 4.61 cm/ ------- LVPW, ED 10.7 mm ------ ann, tiss s IVS/LVPW 0.84 <1.3 DP ratio, ED E/Ea, med 18.31 ------- Ventricular septum ann, tiss IVS, ED 9.04 mm ------ DP Aorta Mitral valve Root diam, 31 mm ------ Peak E vel 84.4 cm/ ------- ED s Left atrium Peak A vel 44.9 cm/ ------- AP dim 46 mm ------ s AP dim 2.37 cm/m^2 <2.2 Deceleratio 158 ms 150-230 index n time Peak 3 mm ------- gradient, D Hg Peak E/A 1.9 ------- ratio Right ventricle Sa vel, lat 7.13 cm/ ------- ann, tiss s DP  ------------------------------------------------------------ Prepared and Electronically Authenticated by  Dorris Carnes 2014-06-05T17:29:[id number]  Cardiology Nuclear Med Study  HENDRICK PAVICH is a 78 y.o. male MRN : [id number] DOB: 10-25-1936  Procedure Date: 02/04/2014  Nuclear Med Background  Indication for Stress Test: Evaluation for Ischemia, Graft Patency and Stent Patency  History: CAD, MI, Cath (95% SVG occl. stent, PTCA), hx. VT, Echo 2014, MPI ~20 yrs ago  Cardiac Risk Factors: Family History - CAD, IDDM, Lipids and PVD  Symptoms: Chest Pain, Chest Pain with Exertion (last date of chest discomfort was yesterday) and DOE  Nuclear Pre-Procedure  Caffeine/Decaff Intake: None > 12 hrs  NPO After: 7:00am   Lungs: clear  O2 Sat: 94% on room air.  IV 0.9% NS with Angio Cath: 22g   IV Site: R Forearm x 1, tolerated well  IV Started by: Irven Baltimore, RN   Chest Size (in): 42  Cup Size: n/a   Height: 5\' 8"  (1.727 m)  Weight: 180 lb (81.647 kg)   BMI: Body mass index is 27.38 kg/(m^2).  Tech Comments: Fasting CBG was 148 at 06:30 with 1/2 dose insulin and metformin taken with breakfast. Irven Baltimore, RN.   Nuclear Med Study  1 or 2 day study: 1 day  Stress Test Type: Carlton Adam   Reading MD: N/A  Order Authorizing Provider: Cristopher Peru, MD   Resting Radionuclide: Technetium 78m Sestamibi  Resting Radionuclide Dose: 11.0 mCi   Stress Radionuclide: Technetium 53m Sestamibi  Stress Radionuclide Dose: 33.0 mCi   Stress Protocol  Rest HR: 67  Stress HR: 93   Rest BP: 137/70  Stress BP: 152/72   Exercise Time (min): n/a  METS: n/a    Dose of Adenosine (mg): n/a  Dose of Lexiscan: 0.4 mg   Dose of Atropine (mg): n/a  Dose of Dobutamine: n/a mcg/kg/min (at max HR)   Stress Test Technologist: Glade Lindell, BS-ES  Nuclear Technologist: Charlton Amor, CNMT   Rest Procedure: Myocardial perfusion imaging was performed at rest 45 minutes following the intravenous administration of Technetium 70m Sestamibi.  Rest ECG: Sinus rhythm, first degree AV block, RAD, inferior lateral TWI.  Stress Procedure: The patient  received IV Lexiscan 0.4 mg over 15-seconds. Technetium 67m Sestamibi injected at 30-seconds. Quantitative spect images were obtained after a 45 minute delay. During the infusion, the patient complained of stomach tightness, cough and began to dry heave. 75 mg aminophylline was given by Glade Hershey, EP. He stated he felt completely better by the end of recovery.  Stress ECG: No significant ST segment change suggestive of ischemia.  QPS  Raw Data Images: Acquisition technically good; LVE.  Stress Images: There  is decreased uptake in the inferior lateral wall.  Rest Images: There is decreased uptake in the inferior lateral wall, less prominent compared to the stress images.  Subtraction (SDS): These findings are consistent with prior infarct and mild peri-infarct ischemia.  Transient Ischemic Dilatation (Normal <1.22): 1.29  Lung/Heart Ratio (Normal <0.45): 0.38  Quantitative Gated Spect Images  QGS EDV: 206 ml  QGS ESV: 155 ml  Impression  Exercise Capacity: Lexiscan with no exercise.  BP Response: Normal blood pressure response.  Clinical Symptoms: No chest pain or dyspnea.  ECG Impression: No significant ST segment change suggestive of ischemia.  Comparison with Prior Nuclear Study: No images to compare  Overall Impression: High risk stress nuclear study with a large, severe intensity, partially reversible inferior lateral defect consistent with prior infarct and mild peri-infarct ischemia; possible transient ischemic dilatation of LV cavity.  LV Ejection Fraction: 25%. LV Wall Motion: Akinesis of the inferior lateral wall.  Kirk Ruths    Left Heart Catheterization with Coronary and Bypass Angiography Report  ONEILL BAIS  78 y.o.  male  1936-07-15  Procedure Date: 08/01/2014  Referring Physician: Cristopher Peru, M.D.  Primary Cardiologist:: Cristopher Peru, M.D.  INDICATIONS: Chronic systolic heart failure with class III-IV symptomatology that includes angina. The remaining saphenous  vein graft was stented in 2014. The studies being done to rule out restenosis.  PROCEDURE: 1. Left heart catheterization; 2. Coronary angiography; 3. Left ventriculography; 4. Bypass graft angiography  CONSENT:  The risks, benefits, and details of the procedure were explained in detail to the patient. Risks including death, stroke, heart attack, kidney injury, allergy, limb ischemia, bleeding and radiation injury were discussed. The patient verbalized understanding and wanted to proceed. Informed written consent was obtained.  PROCEDURE TECHNIQUE: After Xylocaine anesthesia a 5 French Slender sheath was placed in the right radial artery with an angiocath and the modified Seldinger technique. Coronary angiography was done using a 5 F JR 4, 3.5 cm JL, and AL-1 diagnostic catheter. Left ventriculography was done using the JR 4 catheter and hand injection.  Hemostasis was achieved with a radial wrist band after the images were reviewed.  CONTRAST: Total of 140 cc.  COMPLICATIONS: None  HEMODYNAMICS: Aortic pressure 126/62; LV pressure 130/12 mmHg; LVEDP 22 mm mercury  ANGIOGRAPHIC DATA: The left main coronary artery is calcified but patent.  The left anterior descending artery is transapical. The proximal vessel contains eccentric 30-50% stenosis. 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 Proximal to the aneurysm is a first diagonal that contains ostial 85% stenosis. The vessel is moderate in size. The second diagonal is large and free of obstruction. No significant LAD obstruction is noted.  The left circumflex artery is severely diseased. The first marginal is totally occluded. The circumflex then becomes totally occluded after the left atrial recurrent branch arises in the midsegment..  The right coronary artery is totally occluded proximally. Right to right collaterals fill the distal right coronary.Marland Kitchen  BYPASS GRAFT ANGIOGRAPHY: Saphenous vein graft to the circumflex is  totally occluded within the previously stented proximal region.  Saphenous vein graft to the right coronary is totally occluded  LEFT VENTRICULOGRAM: Left ventricular angiogram was done in the 30 RAO projection and revealed a dilated left ventricle with severe global hypokinesis and an estimated ejection fraction of 25%.  IMPRESSIONS: 1. Severe native vessel coronary disease with total occlusion of the mid circumflex, total occlusion of the proximal RCA, high-grade obstruction in the first diagonal. This anatomy is  not significantly different than that from catheterization in 2014.  2. 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 This includes what appears to be chronic occlusion of the stent within the saphenous vein graft to the circumflex that was placed in 2013.  3. Ischemic cardiomyopathy with chronic systolic heart failure, elevated end-diastolic pressures, and ejection fraction of 25%  RECOMMENDATION: There no interventional options.  Medical therapy appears to be the prudent approach. More aggressive volume removal may be helpful.  The ligament of the chronically occluded right coronary, that is a possibility that CTO intervention could be done. This should be discussed with our team if intensification of anti-ischemic therapy does not help resolve symptoms..         Assessment / Plan: 1. CAD s/p CABG now with graft failure x 2. Class 3-4 anginal symptoms (dyspnea and fatigue on exertion). Improved with nitrates. On maximal medical therapy. I have personally reviewed the above data (Echo, nuclear stress test, and coronary angiograms from Sept. 2015 and June 2014). His LAD has no obstructive disease. There is high grade disease in the first diagonal. The LCx is occluded and there are minimal collaterals to the OM. The RCA is occluded proximally but fills well by right to right and left to right collaterals. The length of occlusion appears to be about 20 mm. There is  calcification. There may be a microchannel. The proximal cap is defined. I think that CTO PCI of the RCA is a viable option. I discussed the rationale for the procedure with the patient and his wife- primarily to improve his symptoms. He could possibly get some improvement in EF. The procedure was explained at length including antegrade and retrograde approaches and need for dual groin access. Risks were also reviewed including potential for MI, perforation, bleeding, radiation injury, contrast reaction, arrhythmia, and need for emergent surgery. After lengthy discussion he expresses the desire to proceed as he feels very limited currently. Will arrange for the procedure on October 01, 2014. Will check routine lab work prior to procedure. He is to hold metformin the day of procedure and continue ASA and plavix.  2. Chronic systolic CHF. Continue ACEi, beta blocker, nitrates.  3. Carotid arterial disease  4. Hyperlipidemia.  5. DM type 2.

## 2014-09-23 ENCOUNTER — Inpatient Hospital Stay (HOSPITAL_COMMUNITY)
Admission: EM | Admit: 2014-09-23 | Discharge: 2014-09-26 | DRG: 292 | Disposition: A | Discharge status: Home / Self Care | Payer: Medicare Other | Attending: Cardiology | Admitting: Cardiology

## 2014-09-23 ENCOUNTER — Encounter (HOSPITAL_COMMUNITY): Payer: Self-pay

## 2014-09-23 ENCOUNTER — Emergency Department (HOSPITAL_COMMUNITY): Payer: Medicare Other

## 2014-09-23 DIAGNOSIS — R0602 Shortness of breath: Secondary | ICD-10-CM | POA: Diagnosis not present

## 2014-09-23 DIAGNOSIS — I5042 Chronic combined systolic (congestive) and diastolic (congestive) heart failure: Principal | ICD-10-CM | POA: Diagnosis present

## 2014-09-23 DIAGNOSIS — I252 Old myocardial infarction: Secondary | ICD-10-CM

## 2014-09-23 DIAGNOSIS — I509 Heart failure, unspecified: Secondary | ICD-10-CM

## 2014-09-23 DIAGNOSIS — Z95810 Presence of automatic (implantable) cardiac defibrillator: Secondary | ICD-10-CM

## 2014-09-23 DIAGNOSIS — I440 Atrioventricular block, first degree: Secondary | ICD-10-CM | POA: Diagnosis present

## 2014-09-23 DIAGNOSIS — K219 Gastro-esophageal reflux disease without esophagitis: Secondary | ICD-10-CM | POA: Diagnosis present

## 2014-09-23 DIAGNOSIS — L03114 Cellulitis of left upper limb: Secondary | ICD-10-CM | POA: Diagnosis present

## 2014-09-23 DIAGNOSIS — E119 Type 2 diabetes mellitus without complications: Secondary | ICD-10-CM | POA: Diagnosis present

## 2014-09-23 DIAGNOSIS — Z7982 Long term (current) use of aspirin: Secondary | ICD-10-CM

## 2014-09-23 DIAGNOSIS — E780 Pure hypercholesterolemia: Secondary | ICD-10-CM | POA: Diagnosis present

## 2014-09-23 DIAGNOSIS — I7300 Raynaud's syndrome without gangrene: Secondary | ICD-10-CM | POA: Diagnosis present

## 2014-09-23 DIAGNOSIS — Z794 Long term (current) use of insulin: Secondary | ICD-10-CM

## 2014-09-23 DIAGNOSIS — E785 Hyperlipidemia, unspecified: Secondary | ICD-10-CM | POA: Diagnosis present

## 2014-09-23 DIAGNOSIS — I4729 Other ventricular tachycardia: Secondary | ICD-10-CM

## 2014-09-23 DIAGNOSIS — R001 Bradycardia, unspecified: Secondary | ICD-10-CM | POA: Diagnosis not present

## 2014-09-23 DIAGNOSIS — I472 Ventricular tachycardia: Secondary | ICD-10-CM

## 2014-09-23 DIAGNOSIS — Z955 Presence of coronary angioplasty implant and graft: Secondary | ICD-10-CM

## 2014-09-23 DIAGNOSIS — I5021 Acute systolic (congestive) heart failure: Secondary | ICD-10-CM | POA: Diagnosis not present

## 2014-09-23 DIAGNOSIS — I5023 Acute on chronic systolic (congestive) heart failure: Secondary | ICD-10-CM | POA: Diagnosis not present

## 2014-09-23 DIAGNOSIS — B9561 Methicillin susceptible Staphylococcus aureus infection as the cause of diseases classified elsewhere: Secondary | ICD-10-CM | POA: Diagnosis present

## 2014-09-23 DIAGNOSIS — Z7901 Long term (current) use of anticoagulants: Secondary | ICD-10-CM | POA: Diagnosis not present

## 2014-09-23 DIAGNOSIS — E669 Obesity, unspecified: Secondary | ICD-10-CM | POA: Diagnosis present

## 2014-09-23 DIAGNOSIS — Z951 Presence of aortocoronary bypass graft: Secondary | ICD-10-CM | POA: Diagnosis not present

## 2014-09-23 DIAGNOSIS — I5020 Unspecified systolic (congestive) heart failure: Secondary | ICD-10-CM | POA: Diagnosis not present

## 2014-09-23 DIAGNOSIS — I255 Ischemic cardiomyopathy: Secondary | ICD-10-CM | POA: Diagnosis present

## 2014-09-23 DIAGNOSIS — I25810 Atherosclerosis of coronary artery bypass graft(s) without angina pectoris: Secondary | ICD-10-CM | POA: Diagnosis present

## 2014-09-23 DIAGNOSIS — I739 Peripheral vascular disease, unspecified: Secondary | ICD-10-CM | POA: Diagnosis present

## 2014-09-23 DIAGNOSIS — I2582 Chronic total occlusion of coronary artery: Secondary | ICD-10-CM | POA: Diagnosis present

## 2014-09-23 DIAGNOSIS — D509 Iron deficiency anemia, unspecified: Secondary | ICD-10-CM | POA: Diagnosis present

## 2014-09-23 DIAGNOSIS — E1129 Type 2 diabetes mellitus with other diabetic kidney complication: Secondary | ICD-10-CM

## 2014-09-23 DIAGNOSIS — I5041 Acute combined systolic (congestive) and diastolic (congestive) heart failure: Secondary | ICD-10-CM | POA: Diagnosis not present

## 2014-09-23 LAB — GLUCOSE, CAPILLARY
GLUCOSE-CAPILLARY: 362 mg/dL — AB (ref 70–99)
Glucose-Capillary: 376 mg/dL — ABNORMAL HIGH (ref 70–99)

## 2014-09-23 LAB — COMPREHENSIVE METABOLIC PANEL
ALBUMIN: 3.4 g/dL — AB (ref 3.5–5.2)
ALK PHOS: 69 U/L (ref 39–117)
ALT: 25 U/L (ref 0–53)
AST: 20 U/L (ref 0–37)
Anion gap: 13 (ref 5–15)
BILIRUBIN TOTAL: 0.3 mg/dL (ref 0.3–1.2)
BUN: 29 mg/dL — ABNORMAL HIGH (ref 6–23)
CHLORIDE: 95 meq/L — AB (ref 96–112)
CO2: 28 meq/L (ref 19–32)
CREATININE: 0.9 mg/dL (ref 0.50–1.35)
Calcium: 9.2 mg/dL (ref 8.4–10.5)
GFR calc Af Amer: >90 mL/min (ref >90)
GFR, EST NON AFRICAN AMERICAN: 81 mL/min — AB (ref >90)
Glucose, Bld: 337 mg/dL — ABNORMAL HIGH (ref 70–99)
POTASSIUM: 4.7 meq/L (ref 3.7–5.3)
Sodium: 136 mEq/L — ABNORMAL LOW (ref 137–147)
Total Protein: 6.5 g/dL (ref 6.0–8.3)

## 2014-09-23 LAB — CBC
HCT: 37.6 % — ABNORMAL LOW (ref 39.0–52.0)
HCT: 36.5 % — ABNORMAL LOW (ref 39.0–52.0)
Hemoglobin: 12.5 g/dL — ABNORMAL LOW (ref 13.0–17.0)
Hemoglobin: 12.3 g/dL — ABNORMAL LOW (ref 13.0–17.0)
MCH: 29.5 pg (ref 26.0–34.0)
MCH: 29.4 pg (ref 26.0–34.0)
MCHC: 33.2 g/dL (ref 30.0–36.0)
MCHC: 33.7 g/dL (ref 30.0–36.0)
MCV: 88.7 fL (ref 78.0–100.0)
MCV: 87.3 fL (ref 78.0–100.0)
PLATELETS: 155 10*3/uL (ref 150–400)
Platelets: 149 10*3/uL — ABNORMAL LOW (ref 150–400)
RBC: 4.24 MIL/uL (ref 4.22–5.81)
RBC: 4.18 MIL/uL — ABNORMAL LOW (ref 4.22–5.81)
RDW: 15.1 % (ref 11.5–15.5)
RDW: 15 % (ref 11.5–15.5)
WBC: 10.5 10*3/uL (ref 4.0–10.5)
WBC: 8.9 10*3/uL (ref 4.0–10.5)

## 2014-09-23 LAB — PROTIME-INR
INR: 1.13 (ref 0.00–1.49)
Prothrombin Time: 14.6 seconds (ref 11.6–15.2)

## 2014-09-23 LAB — BASIC METABOLIC PANEL
ANION GAP: 12 (ref 5–15)
BUN: 30 mg/dL — ABNORMAL HIGH (ref 6–23)
CO2: 25 meq/L (ref 19–32)
CREATININE: 0.87 mg/dL (ref 0.50–1.35)
Calcium: 9.2 mg/dL (ref 8.4–10.5)
Chloride: 99 mEq/L (ref 96–112)
GFR calc non Af Amer: 82 mL/min — ABNORMAL LOW (ref >90)
Glucose, Bld: 159 mg/dL — ABNORMAL HIGH (ref 70–99)
Potassium: 4.5 mEq/L (ref 3.7–5.3)
Sodium: 136 mEq/L — ABNORMAL LOW (ref 137–147)

## 2014-09-23 LAB — HEMOGLOBIN A1C
HEMOGLOBIN A1C: 8.8 % — AB (ref <5.7)
Mean Plasma Glucose: 206 mg/dL — ABNORMAL HIGH (ref <117)

## 2014-09-23 LAB — I-STAT TROPONIN, ED: Troponin i, poc: 0.02 ng/mL (ref 0.00–0.08)

## 2014-09-23 LAB — TROPONIN I: Troponin I: <0.3 ng/mL (ref <0.30)

## 2014-09-23 LAB — APTT: aPTT: 36 seconds (ref 24–37)

## 2014-09-23 LAB — PRO B NATRIURETIC PEPTIDE: PRO B NATRI PEPTIDE: 1645 pg/mL — AB (ref 0–450)

## 2014-09-23 LAB — TSH: TSH: 0.593 u[IU]/mL (ref 0.350–4.500)

## 2014-09-23 LAB — MAGNESIUM: MAGNESIUM: 1.8 mg/dL (ref 1.5–2.5)

## 2014-09-23 MED ORDER — HEPARIN SODIUM (PORCINE) 5000 UNIT/ML IJ SOLN
5000.0000 [IU] | Freq: Three times a day (TID) | INTRAMUSCULAR | Status: DC
  Administered 2014-09-23 – 2014-09-26 (×9): 5000 [IU] via SUBCUTANEOUS
  Filled 2014-09-23 (×9): qty 1

## 2014-09-23 MED ORDER — PREGABALIN 25 MG PO CAPS
150.0000 mg | ORAL_CAPSULE | Freq: Every day | ORAL | Status: DC
  Administered 2014-09-23 – 2014-09-26 (×4): 150 mg via ORAL
  Filled 2014-09-23 (×2): qty 1
  Filled 2014-09-23 (×3): qty 2
  Filled 2014-09-23: qty 1
  Filled 2014-09-23: qty 2
  Filled 2014-09-23: qty 1
  Filled 2014-09-23 (×2): qty 2
  Filled 2014-09-23: qty 1

## 2014-09-23 MED ORDER — PRAVASTATIN SODIUM 40 MG PO TABS
40.0000 mg | ORAL_TABLET | Freq: Every day | ORAL | Status: DC
  Administered 2014-09-23 – 2014-09-25 (×3): 40 mg via ORAL
  Filled 2014-09-23 (×4): qty 1

## 2014-09-23 MED ORDER — INSULIN ASPART 100 UNIT/ML ~~LOC~~ SOLN
0.0000 [IU] | Freq: Three times a day (TID) | SUBCUTANEOUS | Status: DC
  Administered 2014-09-23: 20 [IU] via SUBCUTANEOUS
  Administered 2014-09-24: 7 [IU] via SUBCUTANEOUS
  Administered 2014-09-24: 4 [IU] via SUBCUTANEOUS
  Administered 2014-09-24: 15 [IU] via SUBCUTANEOUS
  Administered 2014-09-25: 4 [IU] via SUBCUTANEOUS
  Administered 2014-09-25: 7 [IU] via SUBCUTANEOUS
  Administered 2014-09-25: 3 [IU] via SUBCUTANEOUS
  Administered 2014-09-26: 7 [IU] via SUBCUTANEOUS
  Administered 2014-09-26: 4 [IU] via SUBCUTANEOUS

## 2014-09-23 MED ORDER — SODIUM CHLORIDE 0.9 % IJ SOLN
3.0000 mL | Freq: Two times a day (BID) | INTRAMUSCULAR | Status: DC
  Administered 2014-09-23 – 2014-09-26 (×7): 3 mL via INTRAVENOUS

## 2014-09-23 MED ORDER — CLOPIDOGREL BISULFATE 75 MG PO TABS
75.0000 mg | ORAL_TABLET | Freq: Every day | ORAL | Status: DC
  Administered 2014-09-23 – 2014-09-26 (×4): 75 mg via ORAL
  Filled 2014-09-23 (×4): qty 1

## 2014-09-23 MED ORDER — BISOPROLOL FUMARATE 5 MG PO TABS
15.0000 mg | ORAL_TABLET | Freq: Every day | ORAL | Status: DC
  Administered 2014-09-23 – 2014-09-26 (×4): 15 mg via ORAL
  Filled 2014-09-23 (×4): qty 1

## 2014-09-23 MED ORDER — SODIUM CHLORIDE 0.9 % IJ SOLN: 3.0000 mL | INTRAMUSCULAR | Status: DC | PRN

## 2014-09-23 MED ORDER — ACETAMINOPHEN 325 MG PO TABS: 650.0000 mg | ORAL_TABLET | ORAL | Status: DC | PRN

## 2014-09-23 MED ORDER — INFLUENZA VAC SPLIT QUAD 0.5 ML IM SUSY
0.5000 mL | PREFILLED_SYRINGE | INTRAMUSCULAR | Status: AC
  Administered 2014-09-24: 0.5 mL via INTRAMUSCULAR
  Filled 2014-09-23: qty 0.5

## 2014-09-23 MED ORDER — METAXALONE 800 MG PO TABS: 800.0000 mg | ORAL_TABLET | Freq: Two times a day (BID) | ORAL | Status: DC

## 2014-09-23 MED ORDER — LISINOPRIL 2.5 MG PO TABS
2.5000 mg | ORAL_TABLET | Freq: Every day | ORAL | Status: DC
  Administered 2014-09-23 – 2014-09-26 (×4): 2.5 mg via ORAL
  Filled 2014-09-23 (×4): qty 1

## 2014-09-23 MED ORDER — INSULIN ASPART PROT & ASPART (70-30 MIX) 100 UNIT/ML ~~LOC~~ SUSP: 30.0000 [IU] | Freq: Two times a day (BID) | SUBCUTANEOUS | Status: DC

## 2014-09-23 MED ORDER — ASPIRIN 81 MG PO TBEC: 81.0000 mg | DELAYED_RELEASE_TABLET | Freq: Every day | ORAL | Status: DC

## 2014-09-23 MED ORDER — INSULIN ASPART 100 UNIT/ML ~~LOC~~ SOLN
10.0000 [IU] | Freq: Once | SUBCUTANEOUS | Status: AC
  Administered 2014-09-23: 10 [IU] via SUBCUTANEOUS

## 2014-09-23 MED ORDER — ETODOLAC 400 MG PO TABS
400.0000 mg | ORAL_TABLET | Freq: Every day | ORAL | Status: DC
  Administered 2014-09-23 – 2014-09-25 (×3): 400 mg via ORAL
  Filled 2014-09-23 (×4): qty 1

## 2014-09-23 MED ORDER — FUROSEMIDE 10 MG/ML IJ SOLN
60.0000 mg | Freq: Two times a day (BID) | INTRAMUSCULAR | Status: DC
  Administered 2014-09-23 – 2014-09-25 (×5): 60 mg via INTRAVENOUS
  Filled 2014-09-23 (×7): qty 6

## 2014-09-23 MED ORDER — CEPHALEXIN 500 MG PO CAPS
500.0000 mg | ORAL_CAPSULE | Freq: Three times a day (TID) | ORAL | Status: DC
  Administered 2014-09-23 – 2014-09-26 (×9): 500 mg via ORAL
  Filled 2014-09-23 (×14): qty 1

## 2014-09-23 MED ORDER — NITROGLYCERIN 2 % TD OINT
1.0000 [in_us] | TOPICAL_OINTMENT | Freq: Once | TRANSDERMAL | Status: AC
  Administered 2014-09-23: 1 [in_us] via TOPICAL
  Filled 2014-09-23: qty 1

## 2014-09-23 MED ORDER — POTASSIUM CHLORIDE 20 MEQ PO PACK
20.0000 meq | PACK | Freq: Every day | ORAL | Status: DC
  Filled 2014-09-23: qty 1

## 2014-09-23 MED ORDER — NITROGLYCERIN 0.4 MG SL SUBL
0.4000 mg | SUBLINGUAL_TABLET | SUBLINGUAL | Status: DC | PRN
Start: 2014-09-23 — End: 2014-09-26
  Filled 2014-09-23: qty 1

## 2014-09-23 MED ORDER — POTASSIUM CHLORIDE CRYS ER 20 MEQ PO TBCR
20.0000 meq | EXTENDED_RELEASE_TABLET | Freq: Every day | ORAL | Status: DC
  Administered 2014-09-23 – 2014-09-26 (×4): 20 meq via ORAL
  Filled 2014-09-23 (×4): qty 1

## 2014-09-23 MED ORDER — ONDANSETRON HCL 4 MG/2ML IJ SOLN: 4.0000 mg | Freq: Four times a day (QID) | INTRAMUSCULAR | Status: DC | PRN

## 2014-09-23 MED ORDER — HYDROCODONE-ACETAMINOPHEN 5-325 MG PO TABS
1.0000 | ORAL_TABLET | Freq: Four times a day (QID) | ORAL | Status: DC | PRN
  Administered 2014-09-23 – 2014-09-26 (×7): 1 via ORAL
  Filled 2014-09-23 (×7): qty 1

## 2014-09-23 MED ORDER — ISOSORBIDE MONONITRATE 15 MG HALF TABLET
15.0000 mg | ORAL_TABLET | Freq: Every day | ORAL | Status: DC
  Administered 2014-09-24 – 2014-09-26 (×2): 15 mg via ORAL
  Filled 2014-09-23 (×3): qty 1

## 2014-09-23 MED ORDER — ASPIRIN EC 81 MG PO TBEC
81.0000 mg | DELAYED_RELEASE_TABLET | Freq: Every day | ORAL | Status: DC
  Administered 2014-09-23 – 2014-09-26 (×4): 81 mg via ORAL
  Filled 2014-09-23 (×4): qty 1

## 2014-09-23 MED ORDER — SODIUM CHLORIDE 0.9 % IV SOLN: 250.0000 mL | INTRAVENOUS | Status: DC | PRN

## 2014-09-23 NOTE — ED Notes (Signed)
Pt here for SOB since 2300 last night. No pain. States it is worse when he lays down. Has hx of CHF and other cardiac hx. Is on lasix for swelling.

## 2014-09-23 NOTE — H&P (Signed)
Patient ID: Victor [name] MRN: [id number], DOB/AGE: 76/02/1936   Admit date: 09/23/2014   Primary Physician: Leonides Sake, MD Primary Cardiologist: Dr. Cristopher Peru  Pt. Profile:  78 year old gentleman with known ischemic cardiomyopathy and chronic systolic heart failure results with worsening dyspnea.  Problem List  Past Medical History  Diagnosis Date  . Arthropathy, unspecified, site unspecified   . Occlusion and stenosis of carotid artery without mention of cerebral infarction   . Raynaud's syndrome   . Congestive heart failure, unspecified   . Paroxysmal ventricular tachycardia   . Other specified forms of chronic ischemic heart disease   . Peripheral vascular disease   . Cardiomyopathy, ischemic   . Automatic implantable cardioverter-defibrillator in situ   . High cholesterol   . Myocardial infarction 1990    "4-5 while in [facility] til they put in ICD" (04/24/2013)  . Shortness of breath     "both exertion and lying down" (04/24/2013)  . GERD (gastroesophageal reflux disease)   . Osteoarthrosis, unspecified whether generalized or localized, unspecified site   . Degenerative joint disease   . Arthritis     "back and hands" (04/24/2013)  . Chronic lower back pain   . Coronary artery disease   . Type II or unspecified type diabetes mellitus without mention of complication, not stated as uncontrolled     insulin dependent    Past Surgical History  Procedure Laterality Date  . Anal fistulectomy  2000's  . Cataract extraction, bilateral Bilateral ~ 2009  . Coronary artery bypass graft  01/1989    "CABG X3" 96/02/2013)  . Cardiac defibrillator placement  02/1989-12/2011    "I've had a total of 6 or 7 defibrillators put in" (04/24/2013)  . Finger fracture surgery Left ~ 2000    "pointer" (04/24/2013)     Allergies  Allergies  Allergen Reactions  . Coreg [Carvedilol] Other (See Comments)    Shortness of breath ,fatigue ,dizzyness   . Lovastatin Other (See  Comments)    CK elevation, Myalgias    HPI  This 78 year old gentleman has known ischemic cardiomyopathy.  He presents with worsening dyspnea.  He states he was unable to sleep last night because of orthopnea and dyspnea.  His wife notes that he also gets very anxious which contributes to his dyspnea.  The patient has a past history of coronary artery bypass graft surgery at [facility] in 1990.In June 2014 he presented with unstable angina. At that time the SVG to the RCA was occluded. There was a CTO of the native RCA. The SVG to OM had a severe stenosis and this was successfully stented. In September 2015 he presented with progressive angina and abnormal stress test. Cardiac cath at that time showed occlusion of the SVG to the OM. The OM had minimal collateral flow. Now despite maximal medical therapy he continues to have class 3-4 angina. His anginal symptoms are predominantly dyspnea and fatigue. He states he has never had chest pain. He has a history of chronic systolic CHF and is s/p ICD placement. Prior Echo was technically limited and EF could not be accurately assessed. EF by Myoview was 25%. Right heart cath in June 2014 showed fairly normal right heart and LV filling pressures.  He also has a history of DM type 2, VT, PAD with carotid disease, Hyperlipidemia, and obesity. His defibrillator is followed by Dr. Lovena Le. He states currently he has difficulty walking up any incline and has to stop frequently to rest due to  SOB and fatigue. He has not been able to walk to the born to take care of his 16 horses for the past week because of dyspnea and weakness.  His symptoms do respond to sl Ntg.  The patient is currently scheduled for an attempt at opening his chronic total occlusion of the right coronary artery on October 01, 2014. Last evening beginning at about 10:00 he had worsening dyspnea.  He denies any chest discomfort and states that he has never had chest discomfort as a manifestation of  his ischemic heart disease.  His initial point-of-care troponin is 0.02.  His pro BNP is elevated at 1645. Recently he has been taking dexamethasone from his orthopedist Dr. Marily Memos for back pain. He has recently noted that his blood sugar control has not been as good. Home Medications  Prior to Admission medications   Medication Sig Start Date End Date Taking? Authorizing Provider  aspirin (ASPIR-81) 81 MG EC tablet Take 81 mg by mouth at bedtime.     Historical Provider, MD  bisoprolol (ZEBETA) 10 MG tablet Take 1.5 tablets (15 mg total) by mouth daily. 09/16/14   Evans Lance, MD  clopidogrel (PLAVIX) 75 MG tablet Take 75 mg by mouth daily.    Historical Provider, MD  etodolac (LODINE) 400 MG tablet Take 400 mg by mouth at bedtime.     Historical Provider, MD  furosemide (LASIX) 40 MG tablet Take 1.5 tablets (60 mg total) by mouth 2 (two) times daily. 08/11/14   Evans Lance, MD  HYDROcodone-acetaminophen (LORCET) 5-325 MG per tablet Take 1 tablet by mouth every 6 (six) hours as needed for pain.    Historical Provider, MD  insulin aspart protamine- aspart (NOVOLOG MIX 70/30) (70-30) 100 UNIT/ML injection Inject 30 units in the morning and 30 units in the evening    Historical Provider, MD  isosorbide mononitrate (IMDUR) 30 MG 24 hr tablet Take 0.5 tablets (15 mg total) by mouth daily. 09/16/14   Evans Lance, MD  lisinopril (PRINIVIL,ZESTRIL) 2.5 MG tablet Take 2.5 mg by mouth daily.    Historical Provider, MD  metaxalone (SKELAXIN) 800 MG tablet Take 800 mg by mouth 2 (two) times daily.      Historical Provider, MD  metFORMIN (GLUCOPHAGE) 1000 MG tablet Take 1,000 mg by mouth 2 (two) times daily with a meal.    Historical Provider, MD  nitroGLYCERIN (NITROSTAT) 0.4 MG SL tablet DISSOLVE 1 TABLET UNDER TONGUE EVERY 5 MINS FOR UP TO 3 DOSES AS NEEDED FOR CHEST PAIN 09/16/14   Evans Lance, MD  potassium chloride (KLOR-CON) 20 MEQ packet Take 20 mEq by mouth daily. 08/20/14   Evans Lance, MD  pravastatin (PRAVACHOL) 40 MG tablet Take 40 mg by mouth at bedtime.     Historical Provider, MD  pregabalin (LYRICA) 75 MG capsule Take 150 mg by mouth daily.     Historical Provider, MD  vitamin B-12 (CYANOCOBALAMIN) 1000 MCG tablet Take 1,000 mcg by mouth daily.    Historical Provider, MD    Family History  Family History  Problem Relation Age of Onset  . Heart failure Mother 96  . Heart attack Father   . Leukemia Sister   . Stomach cancer Maternal Grandfather     Social History  History   Social History  . Marital Status: Married    Spouse Name: N/A    Number of Children: N/A  . Years of Education: N/A   Occupational History  . Not  on file.   Social History Main Topics  . Smoking status: Never Smoker   . Smokeless tobacco: Never Used  . Alcohol Use: Yes     Comment: 04/24/2013 "wine w/dinner 15-20 yr ago"  . Drug Use: No  . Sexual Activity: Not on file   Other Topics Concern  . 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      Review of Systems General:  No chills, fever, night sweats or weight changes.  Cardiovascular:  No chest pain, positive for orthopnea and paroxysmal nocturnal dyspnea and exertional dyspnea Dermatological: No rash, lesions/masses.he has a recent painful erythematous lesion on his left elbow which has been draining.  He denies any known trauma. Respiratory: No cough, dyspnea Urologic: No hematuria, dysuria Abdominal:   No nausea, vomiting, diarrhea, bright red blood per rectum, melena, or hematemesis Neurologic:  No visual changes, wkns, changes in mental status. All other systems reviewed and are otherwise negative except as noted above.  Physical Exam  Blood pressure 123/73, pulse 64, temperature 97.2 F (36.2 C), temperature source Oral, resp. rate 17, height 5\' 8"  (1.727  m), weight 185 lb (83.915 kg), SpO2 99 %.  General: Pleasant, NAD Psych: Normal affect. Neuro: Alert and oriented X 3. Moves all extremities spontaneously. HEENT: Normal  Neck: Supple without bruits or JVD. Lungs:  Resp regular and unlabored, mild inspiratory rales at bases. Heart: RRR no s3, s4,systolic ejection murmur at the base. Abdomen: Soft, non-tender, non-distended, BS + x 4.  Extremities: No clubbing, cyanosis or edema. DP/PT/Radials 2+ and equal bilaterally.  Labs  Troponin [facility] of Care Test)  Recent Labs  09/23/14 0927  TROPIPOC 0.02   No results for input(s): CKTOTAL, CKMB, TROPONINI in the last 72 hours. Lab Results  Component Value Date   WBC 10.5 09/23/2014   HGB 12.5* 09/23/2014   HCT 37.6* 09/23/2014   MCV 88.7 09/23/2014   PLT 149* 09/23/2014     Recent Labs Lab 09/23/14 0922  NA 136*  K 4.5  CL 99  CO2 25  BUN 30*  CREATININE 0.87  CALCIUM 9.2  GLUCOSE 159*   No results found for: CHOL, HDL, LDLCALC, TRIG No results found for: DDIMER   Radiology/Studies  Dg Chest 2 View  09/23/2014   CLINICAL DATA:  Shortness of breath.  EXAM: CHEST  2 VIEW  COMPARISON:  May 31, 2013.  FINDINGS: Stable cardiomediastinal silhouette. Both lungs are clear. Status post coronary artery bypass graft. No pneumothorax or pleural effusion is noted. Left-sided pacemaker is unchanged in position. The visualized skeletal structures are unremarkable.  IMPRESSION: No acute cardiopulmonary abnormality seen.   Electronically Signed   By: Sabino Dick M.D.   On: 09/23/2014 10:31    ECG  EKG shows normal sinus rhythm with first-degree AV block.  Frequent PACs are present.  There is a pattern of an old inferior wall myocardial infarction.  Nonspecific ST-T changes are present.  ASSESSMENT AND PLAN 1. CAD s/p CABG now with graft failure x 2. Class 3-4 anginal symptoms (dyspnea and fatigue on exertion). Improved with nitrates. Check serial enzymes.  EKG is not acute.  2.  Chronic systolic CHF. Continue ACEi, beta blocker, nitrates.  Exacerbation of dyspnea at rest and with minimal exertion.  Recent dexamethasone may have promoted salt and fluid retention.  3. Carotid  arterial disease  4. Hyperlipidemia.  5. DM type 2.   6. Possible cellulitis left elbow  Plan: The patient will be admitted to telemetry.  We will treat with IV Lasix for exacerbation of acute on chronic systolic heart failure.  We will continue his home medications.  We will culture left elbow and place on empiric cephalosporin. We will use sliding scale insulin.  Hold metformin while in hospital. Hopefully discharge in a few days then keep appointment for cath for CTO of RCA by Drs. Martinique and [place] on November 11.  Signed, Darlin Coco, MD  09/23/2014, 11:41 AM

## 2014-09-23 NOTE — ED Notes (Signed)
Per Cardiology, will place orders to admit patient.

## 2014-09-23 NOTE — Care Management Note (Addendum)
  Page 1 of 1   09/23/2014     2:47:42 PM CARE MANAGEMENT NOTE 09/23/2014  Patient:  MANUELITO, POAGE   Account Number:  [id number]  Date Initiated:  09/23/2014  Documentation initiated by:  Mariann Laster  Subjective/Objective Assessment:   CHF     Action/Plan:   CM to follow for disposition needs   Anticipated DC Date:  09/26/2014   Anticipated DC Plan:  HOME/SELF CARE         Choice offered to / List presented to:             Status of service:  Completed, signed off Medicare Important Message given?   (If response is "NO", the following Medicare IM given date fields will be blank) Date Medicare IM given:   Medicare IM given by:   Date Additional Medicare IM given:   Additional Medicare IM given by:    Discharge Disposition:  HOME/SELF CARE  Per UR Regulation:  Reviewed for med. necessity/level of care/duration of stay  If discussed at [place] of Stay Meetings, dates discussed:    Comments:  Briona Korpela RN, BSN, MSHL, CCM  Nurse - Case Manager,  (Unit [place])  [phone]  09/23/2014 Social:  From home with wife Jan Dispo Plan:  Home / self care CM will continue to monitor

## 2014-09-23 NOTE — ED Provider Notes (Signed)
CSN: [id number]     Arrival date & time 09/23/14  9024 History   First MD Initiated Contact with Patient 09/23/14 [phone]     Chief Complaint  Patient presents with  . Shortness of Breath     (Consider location/radiation/quality/duration/timing/severity/associated sxs/prior Treatment) Patient is a 78 y.o. male presenting with shortness of breath. The history is provided by the patient.  Shortness of Breath Severity:  Moderate Onset quality:  Sudden Timing:  Intermittent Progression:  Worsening Chronicity:  Recurrent Context comment:  Lying down Relieved by:  Nothing Worsened by:  Nothing tried Associated symptoms: no chest pain, no cough, no fever, no PND, no rash and no vomiting     Past Medical History  Diagnosis Date  . Arthropathy, unspecified, site unspecified   . Occlusion and stenosis of carotid artery without mention of cerebral infarction   . Raynaud's syndrome   . Congestive heart failure, unspecified   . Paroxysmal ventricular tachycardia   . Other specified forms of chronic ischemic heart disease   . Peripheral vascular disease   . Cardiomyopathy, ischemic   . Automatic implantable cardioverter-defibrillator in situ   . High cholesterol   . Myocardial infarction 1990    "4-5 while in [facility] til they put in ICD" (04/24/2013)  . Shortness of breath     "both exertion and lying down" (04/24/2013)  . GERD (gastroesophageal reflux disease)   . Osteoarthrosis, unspecified whether generalized or localized, unspecified site   . Degenerative joint disease   . Arthritis     "back and hands" (04/24/2013)  . Chronic lower back pain   . Coronary artery disease   . Type II or unspecified type diabetes mellitus without mention of complication, not stated as uncontrolled     insulin dependent   Past Surgical History  Procedure Laterality Date  . Anal fistulectomy  2000's  . Cataract extraction, bilateral Bilateral ~ 2009  . Coronary artery bypass graft  01/1989    "CABG  X3" 96/02/2013)  . Cardiac defibrillator placement  02/1989-12/2011    "I've had a total of 6 or 7 defibrillators put in" (04/24/2013)  . Finger fracture surgery Left ~ 2000    "pointer" (04/24/2013)   Family History  Problem Relation Age of Onset  . Heart failure Mother 75  . Heart attack Father   . Leukemia Sister   . Stomach cancer Maternal Grandfather    History  Substance Use Topics  . Smoking status: Never Smoker   . Smokeless tobacco: Never Used  . Alcohol Use: Yes     Comment: 04/24/2013 "wine w/dinner 15-20 yr ago"    Review of Systems  Constitutional: Negative for fever.  Respiratory: Negative for cough and shortness of breath.   Cardiovascular: Negative for chest pain and PND.  Gastrointestinal: Negative for vomiting.  Skin: Negative for rash.  All other systems reviewed and are negative.     Allergies  Coreg and Lovastatin  Home Medications   Prior to Admission medications   Medication Sig Start Date End Date Taking? Authorizing Provider  aspirin (ASPIR-81) 81 MG EC tablet Take 81 mg by mouth at bedtime.     Historical Provider, MD  bisoprolol (ZEBETA) 10 MG tablet Take 1.5 tablets (15 mg total) by mouth daily. 09/16/14   Evans Lance, MD  clopidogrel (PLAVIX) 75 MG tablet Take 75 mg by mouth daily.    Historical Provider, MD  etodolac (LODINE) 400 MG tablet Take 400 mg by mouth at bedtime.  Historical Provider, MD  furosemide (LASIX) 40 MG tablet Take 1.5 tablets (60 mg total) by mouth 2 (two) times daily. 08/11/14   Evans Lance, MD  HYDROcodone-acetaminophen (LORCET) 5-325 MG per tablet Take 1 tablet by mouth every 6 (six) hours as needed for pain.    Historical Provider, MD  insulin aspart protamine- aspart (NOVOLOG MIX 70/30) (70-30) 100 UNIT/ML injection Inject 30 units in the morning and 30 units in the evening    Historical Provider, MD  isosorbide mononitrate (IMDUR) 30 MG 24 hr tablet Take 0.5 tablets (15 mg total) by mouth daily. 09/16/14   Evans Lance, MD  lisinopril (PRINIVIL,ZESTRIL) 2.5 MG tablet Take 2.5 mg by mouth daily.    Historical Provider, MD  metaxalone (SKELAXIN) 800 MG tablet Take 800 mg by mouth 2 (two) times daily.      Historical Provider, MD  metFORMIN (GLUCOPHAGE) 1000 MG tablet Take 1,000 mg by mouth 2 (two) times daily with a meal.    Historical Provider, MD  nitroGLYCERIN (NITROSTAT) 0.4 MG SL tablet DISSOLVE 1 TABLET UNDER TONGUE EVERY 5 MINS FOR UP TO 3 DOSES AS NEEDED FOR CHEST PAIN 09/16/14   Evans Lance, MD  potassium chloride (KLOR-CON) 20 MEQ packet Take 20 mEq by mouth daily. 08/20/14   Evans Lance, MD  pravastatin (PRAVACHOL) 40 MG tablet Take 40 mg by mouth at bedtime.     Historical Provider, MD  pregabalin (LYRICA) 75 MG capsule Take 150 mg by mouth daily.     Historical Provider, MD  vitamin B-12 (CYANOCOBALAMIN) 1000 MCG tablet Take 1,000 mcg by mouth daily.    Historical Provider, MD   BP 138/56 mmHg  Pulse 88  Temp(Src) 97.2 F (36.2 C) (Oral)  Resp 22  Ht 5\' 8"  (1.727 m)  Wt 185 lb (83.915 kg)  BMI 28.14 kg/m2  SpO2 100% Physical Exam  Constitutional: He is oriented to person, place, and time. He appears well-developed and well-nourished. No distress.  HENT:  Head: Normocephalic and atraumatic.  Mouth/Throat: Oropharynx is clear and moist. No oropharyngeal exudate.  Eyes: EOM are normal. Pupils are equal, round, and reactive to light.  Neck: Normal range of motion. Neck supple. No JVD present.  Cardiovascular: Normal rate and regular rhythm.  Exam reveals no friction rub.   No murmur heard. Pulmonary/Chest: Effort normal and breath sounds normal. No respiratory distress. He has no wheezes. He has no rales.  Abdominal: Soft. He exhibits no distension. There is no tenderness. There is no rebound.  Musculoskeletal: Normal range of motion. He exhibits no edema.  Neurological: He is alert and oriented to person, place, and time. No cranial nerve deficit. He exhibits normal muscle tone.   Skin: No rash noted. He is not diaphoretic.  Nursing note and vitals reviewed.   ED Course  Procedures (including critical care time) Labs Review Labs Reviewed  PRO B NATRIURETIC PEPTIDE  BASIC METABOLIC PANEL  CBC  I-STAT [place], ED    Imaging Review Dg Chest 2 View  09/23/2014   CLINICAL DATA:  Shortness of breath.  EXAM: CHEST  2 VIEW  COMPARISON:  May 31, 2013.  FINDINGS: Stable cardiomediastinal silhouette. Both lungs are clear. Status post coronary artery bypass graft. No pneumothorax or pleural effusion is noted. Left-sided pacemaker is unchanged in position. The visualized skeletal structures are unremarkable.  IMPRESSION: No acute cardiopulmonary abnormality seen.   Electronically Signed   By: Sabino Dick M.D.   On: 09/23/2014 10:31  EKG Interpretation   Date/Time:  Tuesday September 23 2014 09:11:12 EST Ventricular Rate:  58 PR Interval:    QRS Duration: 90 QT Interval:  452 QTC Calculation: 443 R Axis:   31 Text Interpretation:  Undetermined rhythm Low voltage QRS Inferior infarct  , age undetermined with 1st degree A-V block Similar to prior Confirmed by  [facility]  MD, [place] ([id number]) on 09/23/2014 9:18:47 AM      MDM   Final diagnoses:  Acute on chronic congestive heart failure, unspecified congestive heart failure type    78 year old male presents with shortness of breath. He has a history of CHF, prior CABG in 1990. He is currently under the care of cardiology and is scheduled for a procedure next week with Dr. Lovena Le he normally has very rare shortness of breath as his anginal equivalent that is usually relieved with nitroglycerin. Shortness of breath has been worsening over the past few days and was not responsive to 5 sublingual nitroglycerin last night. Shortness of breath is worse with lying down. He denies any chest pain, but has never had any chest pain with any of his prior MIs. Here vitals are stable. He has no peripheral edema. No JVD. Lungs are  clear, no rales. We'll check labs, chest x-ray. Will speak with cardiology once results return. Cards plans to admit.   Evelina Bucy, MD 09/23/14 [phone]

## 2014-09-23 NOTE — Progress Notes (Signed)
UR completed Laurissa Cowper K. Sasha Rueth, RN, BSN, [place], CCM  09/23/2014 2:43 PM

## 2014-09-24 DIAGNOSIS — I5042 Chronic combined systolic (congestive) and diastolic (congestive) heart failure: Secondary | ICD-10-CM | POA: Diagnosis not present

## 2014-09-24 DIAGNOSIS — I5023 Acute on chronic systolic (congestive) heart failure: Secondary | ICD-10-CM

## 2014-09-24 LAB — BASIC METABOLIC PANEL
Anion gap: 11 (ref 5–15)
BUN: 38 mg/dL — AB (ref 6–23)
CHLORIDE: 96 meq/L (ref 96–112)
CO2: 28 mEq/L (ref 19–32)
CREATININE: 1.08 mg/dL (ref 0.50–1.35)
Calcium: 9.1 mg/dL (ref 8.4–10.5)
GFR, EST AFRICAN AMERICAN: 75 mL/min — AB (ref >90)
GFR, EST NON AFRICAN AMERICAN: 65 mL/min — AB (ref >90)
Glucose, Bld: 236 mg/dL — ABNORMAL HIGH (ref 70–99)
Potassium: 4.4 mEq/L (ref 3.7–5.3)
Sodium: 135 mEq/L — ABNORMAL LOW (ref 137–147)

## 2014-09-24 LAB — GLUCOSE, CAPILLARY
GLUCOSE-CAPILLARY: 207 mg/dL — AB (ref 70–99)
Glucose-Capillary: 175 mg/dL — ABNORMAL HIGH (ref 70–99)
Glucose-Capillary: 328 mg/dL — ABNORMAL HIGH (ref 70–99)
Glucose-Capillary: 240 mg/dL — ABNORMAL HIGH (ref 70–99)

## 2014-09-24 LAB — TROPONIN I

## 2014-09-24 MED ORDER — INSULIN ASPART 100 UNIT/ML ~~LOC~~ SOLN
4.0000 [IU] | Freq: Once | SUBCUTANEOUS | Status: AC
  Administered 2014-09-24: 4 [IU] via SUBCUTANEOUS

## 2014-09-24 MED ORDER — INSULIN ASPART PROT & ASPART (70-30 MIX) 100 UNIT/ML ~~LOC~~ SUSP
12.0000 [IU] | Freq: Two times a day (BID) | SUBCUTANEOUS | Status: DC
  Administered 2014-09-24 – 2014-09-25 (×3): 12 [IU] via SUBCUTANEOUS
  Administered 2014-09-26: 20 [IU] via SUBCUTANEOUS
  Filled 2014-09-24: qty 10

## 2014-09-24 NOTE — Progress Notes (Signed)
Patient ID: Victor [name], male   DOB: [birth year], 78 y.o.   MRN: [id number]    Subjective:  Breathing better no chest pain   Objective:  Filed Vitals:   09/23/14 2058 09/24/14 0528 09/24/14 0928 09/24/14 1100  BP: 113/77 148/86 127/49 145/47  Pulse: 67 61 61 64  Temp:   97.3 F (36.3 C)   TempSrc:   Oral   Resp: 18 18    Height:      Weight:  82.146 kg (181 lb 1.6 oz)    SpO2: 97% 100% 98% 99%    Intake/Output from previous day:  Intake/Output Summary (Last 24 hours) at 09/24/14 1215 Last data filed at 09/24/14 1100  Gross per 24 hour  Intake   1200 ml  Output   3200 ml  Net  -2000 ml    Physical Exam: Affect appropriate Healthy:  appears stated age HEENT: normal Neck supple with no adenopathy JVP normal no bruits no thyromegaly Lungs clear with no wheezing and good diaphragmatic motion Heart:  S1/S2 no murmur, no rub, gallop or click PMI normal Abdomen: benighn, BS positve, no tenderness, no AAA no bruit.  No HSM or HJR Distal pulses intact with no bruits No edema Neuro non-focal Left elbow fluctuant with dressing on  No muscular weakness   Lab Results: Basic Metabolic Panel:  Recent Labs  09/23/14 1527 09/24/14 0058  NA 136* 135*  K 4.7 4.4  CL 95* 96  CO2 28 28  GLUCOSE 337* 236*  BUN 29* 38*  CREATININE 0.90 1.08  CALCIUM 9.2 9.1  MG 1.8  --    Liver Function Tests:  Recent Labs  09/23/14 1527  AST 20  ALT 25  ALKPHOS 69  BILITOT 0.3  PROT 6.5  ALBUMIN 3.4*   CBC:  Recent Labs  09/23/14 0922 09/23/14 1527  WBC 10.5 8.9  HGB 12.5* 12.3*  HCT 37.6* 36.5*  MCV 88.7 87.3  PLT 149* 155   Cardiac Enzymes:  Recent Labs  09/23/14 1527 09/23/14 2058 09/24/14 0058  TROPONINI <0.30 <0.30 <0.30   BNP    Component Value Date/Time   PROBNP 1645.0* 09/23/2014 0922     Hemoglobin A1C:  Recent Labs  09/23/14 1527  HGBA1C 8.8*   Thyroid Function Tests:  Recent Labs  09/23/14 1527  TSH 0.593    Imaging: Dg Chest  2 View  09/23/2014   CLINICAL DATA:  Shortness of breath.  EXAM: CHEST  2 VIEW  COMPARISON:  May 31, 2013.  FINDINGS: Stable cardiomediastinal silhouette. Both lungs are clear. Status post coronary artery bypass graft. No pneumothorax or pleural effusion is noted. Left-sided pacemaker is unchanged in position. The visualized skeletal structures are unremarkable.  IMPRESSION: No acute cardiopulmonary abnormality seen.   Electronically Signed   By: Sabino Dick M.D.   On: 09/23/2014 10:31    Cardiac Studies:  ECG:  SR low voltage old IMI     Telemetry:  NSR one 5 beat run of NSVT   Echo: 6/14 reviewed Study Conclusions  - Left ventricle: Extremely poor acoustic windows limit study. Overal LV systolic function appears depress, at least moderately; some images appear severe. Inferior and posterior walls appear severely hypokinetic to akinetic. The cavity size was normal. Wall thickness was normal. Doppler parameters are consistent with abnormal left ventricular relaxation (grade 1 diastolic dysfunction). - Mitral valve: Mild regurgitation. - Left atrium: The atrium was moderately dilated.   Medications:   . aspirin EC  81 mg Oral Daily  .  bisoprolol  15 mg Oral Daily  . cephALEXin  500 mg Oral 3 times per day  . clopidogrel  75 mg Oral Daily  . etodolac  400 mg Oral QHS  . furosemide  60 mg Intravenous BID  . heparin  5,000 Units Subcutaneous 3 times per day  . Influenza vac split quadrivalent PF  0.5 mL Intramuscular Tomorrow-1000  . insulin aspart  0-20 Units Subcutaneous TID WC  . insulin aspart protamine- aspart  12-20 Units Subcutaneous BID WC  . isosorbide mononitrate  15 mg Oral Daily  . lisinopril  2.5 mg Oral Daily  . potassium chloride  20 mEq Oral Daily  . pravastatin  40 mg Oral QHS  . pregabalin  150 mg Oral Daily  . sodium chloride  3 mL Intravenous Q12H       Assessment/Plan:  CHF:  Continue lasix 60 iv bid  Cath 08/01/14 EF 25%  On ACE  BNP  elevated   CAD:  R/O no ECG changes CABG with SVG failure  Possible CTO native RCA 10/01/14 with Dr Martinique continue ASA/Plavix nitrates and bisoprolol ID:  Day 2 antibiotics left elbow skin infection   Chol:  On statin DM:  BS high as is A1c  Ordered his 70/30 insulin which was omitted on admission   Jenkins Rouge 09/24/2014, 12:15 PM

## 2014-09-24 NOTE — Progress Notes (Signed)
Patient Name: Victor [name] Date of Encounter: 09/24/2014  Primary Cardiologist: Dr. Cristopher Peru   Active Problems:   CHF (congestive heart failure), NYHA class III    SUBJECTIVE  Denies any SOB or CP, states he slept well last night.  CURRENT MEDS . aspirin EC  81 mg Oral Daily  . bisoprolol  15 mg Oral Daily  . cephALEXin  500 mg Oral 3 times per day  . clopidogrel  75 mg Oral Daily  . etodolac  400 mg Oral QHS  . furosemide  60 mg Intravenous BID  . heparin  5,000 Units Subcutaneous 3 times per day  . Influenza vac split quadrivalent PF  0.5 mL Intramuscular Tomorrow-1000  . insulin aspart  0-20 Units Subcutaneous TID WC  . isosorbide mononitrate  15 mg Oral Daily  . lisinopril  2.5 mg Oral Daily  . potassium chloride  20 mEq Oral Daily  . pravastatin  40 mg Oral QHS  . pregabalin  150 mg Oral Daily  . sodium chloride  3 mL Intravenous Q12H    OBJECTIVE  Filed Vitals:   09/23/14 1337 09/23/14 1400 09/23/14 2058 09/24/14 0528  BP:  121/53 113/77 148/86  Pulse:  66 67 61  Temp:  97.5 F (36.4 C)    TempSrc:  Oral    Resp:  20 18 18   Height: 5\' 4"  (1.626 m)     Weight: 401 lb 3.8 oz (182 kg)   181 lb 1.6 oz (82.146 kg)  SpO2:  98% 97% 100%    Intake/Output Summary (Last 24 hours) at 09/24/14 0804 Last data filed at 09/24/14 0527  Gross per 24 hour  Intake    960 ml  Output   2300 ml  Net  -1340 ml   Filed Weights   09/23/14 0909 09/23/14 1337 09/24/14 0528  Weight: 185 lb (83.915 kg) 401 lb 3.8 oz (182 kg) 181 lb 1.6 oz (82.146 kg)    PHYSICAL EXAM  General: Pleasant, NAD. Neuro: Alert and oriented X 3. Moves all extremities spontaneously. Psych: Normal affect. HEENT:  Normal  Neck: Supple without bruits or JVD. Lungs:  Resp regular and unlabored, CTA. Heart: regularly irregular no s3, s4, or murmurs. Abdomen: Soft, non-tender, non-distended, BS + x 4.  Extremities: No clubbing, cyanosis or edema. DP/PT/Radials 2+ and equal  bilaterally.  Accessory Clinical Findings  CBC  Recent Labs  09/23/14 0922 09/23/14 1527  WBC 10.5 8.9  HGB 12.5* 12.3*  HCT 37.6* 36.5*  MCV 88.7 87.3  PLT 149* 749   Basic Metabolic Panel  Recent Labs  09/23/14 1527 09/24/14 0058  NA 136* 135*  K 4.7 4.4  CL 95* 96  CO2 28 28  GLUCOSE 337* 236*  BUN 29* 38*  CREATININE 0.90 1.08  CALCIUM 9.2 9.1  MG 1.8  --    Liver Function Tests  Recent Labs  09/23/14 1527  AST 20  ALT 25  ALKPHOS 69  BILITOT 0.3  PROT 6.5  ALBUMIN 3.4*   Cardiac Enzymes  Recent Labs  09/23/14 1527 09/23/14 2058 09/24/14 0058  TROPONINI <0.30 <0.30 <0.30   Hemoglobin A1C  Recent Labs  09/23/14 1527  HGBA1C 8.8*   Fasting Lipid Panel No results for input(s): CHOL, HDL, LDLCALC, TRIG, CHOLHDL, LDLDIRECT in the last 72 hours. Thyroid Function Tests  Recent Labs  09/23/14 1527  TSH 0.593    TELE Sinus brady with HR 50s, 1st degree heart block, PAC vs wenkebach, ?occasional junctional rhythm  ECG  Sinus brady, ?wenkeback  Echocardiogram 04/25/2013  - Left ventricle: Extremely poor acoustic windows limit study. Overal LV systolic function appears depress, at least moderately; some images appear severe. Inferior and posterior walls appear severely hypokinetic to akinetic. The cavity size was normal. Wall thickness was normal. Doppler parameters are consistent with abnormal left ventricular relaxation (grade 1 diastolic dysfunction). - Mitral valve: Mild regurgitation. - Left atrium: The atrium was moderately dilated. Transthoracic echocardiography. M-mode, complete 2D,    Radiology/Studies  Dg Chest 2 View  09/23/2014   CLINICAL DATA:  Shortness of breath.  EXAM: CHEST  2 VIEW  COMPARISON:  May 31, 2013.  FINDINGS: Stable cardiomediastinal silhouette. Both lungs are clear. Status post coronary artery bypass graft. No pneumothorax or pleural effusion is noted. Left-sided pacemaker is unchanged in  position. The visualized skeletal structures are unremarkable.  IMPRESSION: No acute cardiopulmonary abnormality seen.   Electronically Signed   By: Sabino Dick M.D.   On: 09/23/2014 10:31    ASSESSMENT AND PLAN  78 year old gentleman with known ischemic cardiomyopathy and chronic systolic heart failure come in with worsening dyspnea.  1. Acute on chronic systolic heart failure  - proBNP 1645, net -1.3L. Currently diurese with 60mg  BID IV lasix, on 60 BID PO lasix at home   -well diureses, likely close to baseline, physical exam clear lung and no edema, however patient worried he will be SOB if off IV lasix too soon. Likely can transition to PO lasix either today or tomorrow before discharge tomorrow. Then future cath as previously scheduled  2. CAD s/p CABG with graft failure x2 (SVG to RCA and SVG to OM failed)  - some exertional dyspnea and fatigue which appear to be his anginal equivalent, no CP  - continue ASA, plavix, bisoprolol, imdur and lisinopril  - neg serial trop  - pending cath for CTO of RCA by Dr. Martinique and Irish Lack on 11/11  3. Bradycardia  - telemetry to HR mainly in 50s, ?occasional junctional rhythm vs wenkebach. If symptomatic, may need to scale back Zebeta  4. ICM with baseline EF 25%  5. Carotid arterial disease  6. Dyslipidemia  7. DM, A1C 8.8, need better control  - worried about his blood sugar, which has been high recently, may consider diabetes coordinator consult  8. Possible cellulitis L elbow: on emperic cephalosporin  Signed, Almyra Deforest PA-C Pager: [phone]  Please see my separate progress note.  On cephalosporin for left elbow infection Restarted on 70/30 insulin  Jenkins Rouge

## 2014-09-24 NOTE — Progress Notes (Signed)
I was notified by Central Telemetry that pt. Had a 5 beat run of V-Tach, I assessed pt. And pt. Stated he felt only some fluttering in his chest but no pain and no SOB, pt. Vitals around his baseline, Cardiologist on call came in to see pt. And I advised him of pt.'s arrhythmia and he stated it was fine. Pt. Was stable and showed no other signs or symptoms of distress

## 2014-09-24 NOTE — Progress Notes (Signed)
Inpatient Diabetes Program Recommendations  AACE/ADA: New Consensus Statement on Inpatient Glycemic Control (2013)  Target Ranges:  Prepandial:   less than 140 mg/dL      Peak postprandial:   less than 180 mg/dL (1-2 hours)      Critically ill patients:  140 - 180 mg/dL   Reason for Assessment:  Results for CORTEZ, FLIPPEN (MRN [id number]) as of 09/24/2014 10:49  Ref. Range 09/23/2014 17:16 09/23/2014 20:52 09/24/2014 05:33  Glucose-Capillary Latest Range: 70-99 mg/dL 362 (H) 376 (H) 175 (H)    Diabetes history: Type 2 diabetes Outpatient Diabetes medications: Novolog mix 70/30- 20 units q AM and 12 units q supper Current orders for Inpatient glycemic control:  Novolog resist tid with meals  Sent text page requesting the addition of home dose of Novolog 70/30 to PA.  Will follow.  Adah Perl, RN, BC-ADM Inpatient Diabetes Coordinator Pager [phone]

## 2014-09-24 NOTE — Progress Notes (Signed)
Patient seen by Diabetes Coordinator. Home dose of 70/30 insulin restarted for better BG control  Signed, Almyra Deforest PA Pager: [phone]

## 2014-09-25 DIAGNOSIS — I5041 Acute combined systolic (congestive) and diastolic (congestive) heart failure: Secondary | ICD-10-CM

## 2014-09-25 LAB — PRO B NATRIURETIC PEPTIDE: PRO B NATRI PEPTIDE: 1195 pg/mL — AB (ref 0–450)

## 2014-09-25 LAB — BASIC METABOLIC PANEL
Anion gap: 11 (ref 5–15)
BUN: 42 mg/dL — ABNORMAL HIGH (ref 6–23)
CHLORIDE: 98 meq/L (ref 96–112)
CO2: 28 meq/L (ref 19–32)
Calcium: 9 mg/dL (ref 8.4–10.5)
Creatinine, Ser: 0.97 mg/dL (ref 0.50–1.35)
GFR calc non Af Amer: 78 mL/min — ABNORMAL LOW (ref >90)
GLUCOSE: 203 mg/dL — AB (ref 70–99)
POTASSIUM: 4.8 meq/L (ref 3.7–5.3)
Sodium: 137 mEq/L (ref 137–147)

## 2014-09-25 LAB — GLUCOSE, CAPILLARY
GLUCOSE-CAPILLARY: 206 mg/dL — AB (ref 70–99)
GLUCOSE-CAPILLARY: 171 mg/dL — AB (ref 70–99)
Glucose-Capillary: 137 mg/dL — ABNORMAL HIGH (ref 70–99)

## 2014-09-25 NOTE — Progress Notes (Signed)
Staph Aureus grew from L elbow culture, sensitivity currently pending. If MSRA, will need to change Abx. It appears patient also have positive staph in Nov 2012.   Hilbert Corrigan PA Pager: [phone]

## 2014-09-25 NOTE — Progress Notes (Signed)
Patient Name: Victor [name] Date of Encounter: 09/25/2014  Primary Cardiologist: Dr. Cristopher Peru  Pt. Profile:  78 year old gentleman with known ischemic cardiomyopathy and chronic systolic heart failure results with worsening dyspnea.   Active Problems:   CHF (congestive heart failure), NYHA class III    SUBJECTIVE  Had a good day yesterday. However, feeling bad last night, could not sleep due to SOB even with further diuresis. Denies any significant chest discomfort. Feeling tired now as he didn't get much sleep   CURRENT MEDS . aspirin EC  81 mg Oral Daily  . bisoprolol  15 mg Oral Daily  . cephALEXin  500 mg Oral 3 times per day  . clopidogrel  75 mg Oral Daily  . etodolac  400 mg Oral QHS  . furosemide  60 mg Intravenous BID  . heparin  5,000 Units Subcutaneous 3 times per day  . insulin aspart  0-20 Units Subcutaneous TID WC  . insulin aspart protamine- aspart  12-20 Units Subcutaneous BID WC  . isosorbide mononitrate  15 mg Oral Daily  . lisinopril  2.5 mg Oral Daily  . potassium chloride  20 mEq Oral Daily  . pravastatin  40 mg Oral QHS  . pregabalin  150 mg Oral Daily  . sodium chloride  3 mL Intravenous Q12H    OBJECTIVE  Filed Vitals:   09/24/14 1300 09/24/14 1936 09/25/14 0225 09/25/14 0624  BP: 110/67 118/35 116/38 118/44  Pulse: 65 52 55 59  Temp: 97.4 F (36.3 C) 97.3 F (36.3 C)  97.7 F (36.5 C)  TempSrc: Oral Oral  Oral  Resp: 20 20  20   Height:      Weight:    181 lb 10.5 oz (82.4 kg)  SpO2: 96% 99% 99% 98%    Intake/Output Summary (Last 24 hours) at 09/25/14 0741 Last data filed at 09/25/14 4854  Gross per 24 hour  Intake    960 ml  Output   2600 ml  Net  -1640 ml   Filed Weights   09/23/14 1337 09/24/14 0528 09/25/14 0624  Weight: 401 lb 3.8 oz (182 kg) 181 lb 1.6 oz (82.146 kg) 181 lb 10.5 oz (82.4 kg)    PHYSICAL EXAM  General: Pleasant, NAD. Neuro: Alert and oriented X 3. Moves all extremities spontaneously. Psych:  Normal affect. HEENT:  Normal  Neck: Supple without bruits.  +JVD. Lungs:  Resp regular and unlabored, CTA. Heart: RRR no s3, s4, or murmurs. Abdomen: Soft, non-tender, non-distended, BS + x 4.  Extremities: No clubbing, cyanosis or edema. DP/PT/Radials 2+ and equal bilaterally.  Accessory Clinical Findings  CBC  Recent Labs  09/23/14 0922 09/23/14 1527  WBC 10.5 8.9  HGB 12.5* 12.3*  HCT 37.6* 36.5*  MCV 88.7 87.3  PLT 149* 627   Basic Metabolic Panel  Recent Labs  09/23/14 1527 09/24/14 0058 09/25/14 0505  NA 136* 135* 137  K 4.7 4.4 4.8  CL 95* 96 98  CO2 28 28 28   GLUCOSE 337* 236* 203*  BUN 29* 38* 42*  CREATININE 0.90 1.08 0.97  CALCIUM 9.2 9.1 9.0  MG 1.8  --   --    Liver Function Tests  Recent Labs  09/23/14 1527  AST 20  ALT 25  ALKPHOS 69  BILITOT 0.3  PROT 6.5  ALBUMIN 3.4*   Cardiac Enzymes  Recent Labs  09/23/14 1527 09/23/14 2058 09/24/14 0058  TROPONINI <0.30 <0.30 <0.30   Hemoglobin A1C  Recent Labs  09/23/14 1527  HGBA1C 8.8*   Thyroid Function Tests  Recent Labs  09/23/14 1527  TSH 0.593    TELE Sinus bradycardia with PACs, HR 50s    ECG  No new EKG  Echocardiogram 04/25/2013  - Left ventricle: Extremely poor acoustic windows limit study. Overal LV systolic function appears depress, at least moderately; some images appear severe. Inferior and posterior walls appear severely hypokinetic to akinetic. The cavity size was normal. Wall thickness was normal. Doppler parameters are consistent with abnormal left ventricular relaxation (grade 1 diastolic dysfunction). - Mitral valve: Mild regurgitation. - Left atrium: The atrium was moderately dilated.    Radiology/Studies  Dg Chest 2 View  09/23/2014   CLINICAL DATA:  Shortness of breath.  EXAM: CHEST  2 VIEW  COMPARISON:  May 31, 2013.  FINDINGS: Stable cardiomediastinal silhouette. Both lungs are clear. Status post coronary artery bypass graft.  No pneumothorax or pleural effusion is noted. Left-sided pacemaker is unchanged in position. The visualized skeletal structures are unremarkable.  IMPRESSION: No acute cardiopulmonary abnormality seen.   Electronically Signed   By: Sabino Dick M.D.   On: 09/23/2014 10:31    ASSESSMENT AND PLAN  1. Acute on chronic combined systolic and diastolic CHF: Cath 1/60/10 EF 25% On ACE BNP 1600 on admission  - -2.7L, weight 181 unchanged from yesterday (down from 185), Cr stable  - unclear what caused the SOB overnight, as he was feeling well yesterday before further diuresis. May be related to anxiety vs CTO of coronary. Will continue IV lasix for now, but weight 181 may be his dry weight (appear to be this same wt on office visit in Sept).recheck proBNP. Has not had an echo since June 2014 which had poor image, at some point, may need a repeat echo   2. CAD: R/O no ECG changes CABG with SVG failure Possible CTO native RCA 10/01/14 with Dr Martinique continue ASA/Plavix nitrates and bisoprolol  3. ID: Day 2 antibiotics left elbow skin infection  4. Chol: On statin 5. DM: BS high as is A1c   - continue 70/30 insulin which was omitted on admission   Weston Brass Woodward Ku Pager: [phone]  Patient examined chart reviewed  Lungs much clearer  Has diuresed well.  Cant have MRI to assess EF due to AICD.  Infection left elbow also looks better Continue diuresis  Jenkins Rouge

## 2014-09-25 NOTE — Progress Notes (Signed)
Pt. C/o SOB. Pt. Assessed and VSS within normal limits. O2 sats 99% on room air. Pt. Requested SL nitro. Stated his PCP told him to take nitro when SOB. Pt. Repositioned in bed. HOB raised. Pt. Stated he felt better. SL nitro not administered at this time. RN will continue to monitor pt. For changes in condition. Kalon Erhardt, Katherine Roan

## 2014-09-25 NOTE — Progress Notes (Signed)
Pt. Stated he is having trouble breathing when trying to sleep. RN in to assess pt. And no distress noted. Pt. Resting in bed. O2 sats 98% on RA. Pt. Requesting O2. 2L O2 via nasal cannula placed on pt. For comfort. RN will continue to monitor pt. For changes in condition. Ulanda Tackett, Katherine Roan

## 2014-09-26 DIAGNOSIS — I5021 Acute systolic (congestive) heart failure: Secondary | ICD-10-CM

## 2014-09-26 LAB — BASIC METABOLIC PANEL
ANION GAP: 15 (ref 5–15)
BUN: 43 mg/dL — ABNORMAL HIGH (ref 6–23)
CHLORIDE: 99 meq/L (ref 96–112)
CO2: 26 meq/L (ref 19–32)
CREATININE: 1.05 mg/dL (ref 0.50–1.35)
Calcium: 8.7 mg/dL (ref 8.4–10.5)
GFR calc non Af Amer: 67 mL/min — ABNORMAL LOW (ref >90)
GFR, EST AFRICAN AMERICAN: 78 mL/min — AB (ref >90)
Glucose, Bld: 245 mg/dL — ABNORMAL HIGH (ref 70–99)
POTASSIUM: 4.8 meq/L (ref 3.7–5.3)
SODIUM: 140 meq/L (ref 137–147)

## 2014-09-26 LAB — WOUND CULTURE

## 2014-09-26 LAB — GLUCOSE, CAPILLARY
GLUCOSE-CAPILLARY: 207 mg/dL — AB (ref 70–99)
Glucose-Capillary: 100 mg/dL — ABNORMAL HIGH (ref 70–99)
Glucose-Capillary: 156 mg/dL — ABNORMAL HIGH (ref 70–99)

## 2014-09-26 MED ORDER — DOXYCYCLINE HYCLATE 100 MG PO TABS
100.0000 mg | ORAL_TABLET | Freq: Two times a day (BID) | ORAL | Status: DC
  Administered 2014-09-26: 100 mg via ORAL
  Filled 2014-09-26 (×2): qty 1

## 2014-09-26 MED ORDER — FUROSEMIDE 40 MG PO TABS
60.0000 mg | ORAL_TABLET | Freq: Two times a day (BID) | ORAL | Status: DC
  Administered 2014-09-26: 60 mg via ORAL
  Filled 2014-09-26 (×3): qty 1

## 2014-09-26 MED ORDER — DOXYCYCLINE HYCLATE 100 MG PO TABS: 100.0000 mg | ORAL_TABLET | Freq: Two times a day (BID) | ORAL | Status: DC

## 2014-09-26 NOTE — Progress Notes (Signed)
Patient Name: Victor [name] Date of Encounter: 09/26/2014  Primary Cardiologist: Dr. Cristopher Peru  Pt. Profile:  78 year old gentleman with known ischemic cardiomyopathy and chronic systolic heart failure results with worsening dyspnea.    Principal Problem:   CHF (congestive heart failure), NYHA class III Active Problems:   Diabetes   Cardiomyopathy, ischemic   VENTRICULAR TACHYCARDIA   Carotid artery disease   Peripheral vascular disease   Automatic implantable cardioverter-defibrillator in situ   Anemia-chronic\   CAD (coronary artery disease) of artery bypass graft    SUBJECTIVE  Denies any significant CP, mild SOB around 3 am, quickly resolved. Feeling well this morning, wish to go home  CURRENT MEDS . aspirin EC  81 mg Oral Daily  . bisoprolol  15 mg Oral Daily  . cephALEXin  500 mg Oral 3 times per day  . clopidogrel  75 mg Oral Daily  . etodolac  400 mg Oral QHS  . furosemide  60 mg Intravenous BID  . heparin  5,000 Units Subcutaneous 3 times per day  . insulin aspart  0-20 Units Subcutaneous TID WC  . insulin aspart protamine- aspart  12-20 Units Subcutaneous BID WC  . isosorbide mononitrate  15 mg Oral Daily  . lisinopril  2.5 mg Oral Daily  . potassium chloride  20 mEq Oral Daily  . pravastatin  40 mg Oral QHS  . pregabalin  150 mg Oral Daily  . sodium chloride  3 mL Intravenous Q12H    OBJECTIVE  Filed Vitals:   09/25/14 0955 09/25/14 1419 09/25/14 2113 09/26/14 0619  BP: 129/36 106/66 136/41 128/47  Pulse: 57 61 60 44  Temp: 97.6 F (36.4 C) 97.5 F (36.4 C) 97.7 F (36.5 C) 97.7 F (36.5 C)  TempSrc: Oral Oral Oral Oral  Resp: 20 18 18 18   Height:      Weight:    182 lb 1.6 oz (82.6 kg)  SpO2: 98% 98% 100% 100%    Intake/Output Summary (Last 24 hours) at 09/26/14 0808 Last data filed at 09/26/14 1601  Gross per 24 hour  Intake    903 ml  Output   2625 ml  Net  -1722 ml   Filed Weights   09/24/14 0528 09/25/14 0624 09/26/14 0619    Weight: 181 lb 1.6 oz (82.146 kg) 181 lb 10.5 oz (82.4 kg) 182 lb 1.6 oz (82.6 kg)    PHYSICAL EXAM  General: Pleasant, NAD. Neuro: Alert and oriented X 3. Moves all extremities spontaneously. Psych: Normal affect. HEENT:  Normal  Neck: Supple without bruits or JVD. Lungs:  Resp regular and unlabored, CTA. Heart: RRR no s3, s4, or murmurs. Abdomen: Soft, non-tender, non-distended, BS + x 4.  Extremities: No clubbing, cyanosis or edema. DP/PT/Radials 2+ and equal bilaterally.  Accessory Clinical Findings  CBC  Recent Labs  09/23/14 0922 09/23/14 1527  WBC 10.5 8.9  HGB 12.5* 12.3*  HCT 37.6* 36.5*  MCV 88.7 87.3  PLT 149* 093   Basic Metabolic Panel  Recent Labs  09/23/14 1527  09/25/14 0505 09/26/14 0315  NA 136*  < > 137 140  K 4.7  < > 4.8 4.8  CL 95*  < > 98 99  CO2 28  < > 28 26  GLUCOSE 337*  < > 203* 245*  BUN 29*  < > 42* 43*  CREATININE 0.90  < > 0.97 1.05  CALCIUM 9.2  < > 9.0 8.7  MG 1.8  --   --   --   < > =  values in this interval not displayed. Liver Function Tests  Recent Labs  09/23/14 1527  AST 20  ALT 25  ALKPHOS 69  BILITOT 0.3  PROT 6.5  ALBUMIN 3.4*   Cardiac Enzymes  Recent Labs  09/23/14 1527 09/23/14 2058 09/24/14 0058  TROPONINI <0.30 <0.30 <0.30   Hemoglobin A1C  Recent Labs  09/23/14 1527  HGBA1C 8.8*   Thyroid Function Tests  Recent Labs  09/23/14 1527  TSH 0.593    TELE Sinus brady with HR 50s, some PACs    ECG  No new EKG  Echocardiogram 04/25/2013  - Left ventricle: Extremely poor acoustic windows limit study. Overal LV systolic function appears depress, at least moderately; some images appear severe. Inferior and posterior walls appear severely hypokinetic to akinetic. The cavity size was normal. Wall thickness was normal. Doppler parameters are consistent with abnormal left ventricular relaxation (grade 1 diastolic dysfunction). - Mitral valve: Mild regurgitation. - Left  atrium: The atrium was moderately dilated.    Radiology/Studies  Dg Chest 2 View  09/23/2014   CLINICAL DATA:  Shortness of breath.  EXAM: CHEST  2 VIEW  COMPARISON:  May 31, 2013.  FINDINGS: Stable cardiomediastinal silhouette. Both lungs are clear. Status post coronary artery bypass graft. No pneumothorax or pleural effusion is noted. Left-sided pacemaker is unchanged in position. The visualized skeletal structures are unremarkable.  IMPRESSION: No acute cardiopulmonary abnormality seen.   Electronically Signed   By: Sabino Dick M.D.   On: 09/23/2014 10:31    ASSESSMENT AND PLAN  1. Acute on chronic combined systolic and diastolic CHF: Cath 0/97/35 EF 25% On ACE BNP 1600 on admission - -4.4L, weight essentially unchanged from 2 days ago (?accuracy), Cr stable - some SOB yesterday, rechecked proBNP down to 1200. Diuresed further overnight, feeling well this morning, ready to go home.   - will change IV lasix to home PO lasix dose, per patient he is compliant with low Na diet, however drink a lot of water, I have discussed with him regarding fluid restriction. I also discussed with him that 181 lbs seems to be his dry weight, he should measure his weight every morning, and call us if weight increase by >3 lbs overnight or 5 lbs in a single week. If weight persistently high, will permanently increase lasix dose to 80mg  BID   2. CAD: R/O no ECG changes CABG with SVG failure Possible CTO native RCA 10/01/14 with Dr Martinique continue ASA/Plavix nitrates and bisoprolol  3. ID: Day 2 antibiotics left elbow skin infection   - would culture final result pending, prelim result shows staph aureus, currently on keflex. Unclear if MSSA or MRSA until final result come back. If patient is discharged prior to final result and sensitivity coming back, may need to change abx. Discussed with pharmacist, who recommend Bactrim or doxycycline for potential MRSA  4. Chol: On  statin 5. DM: BS high as is A1c  - continue 70/30 insulin which was omitted on admission  6. Bradycardia  - if worsen, may need to scale back bisoprolol  Signed, Woodward Ku Pager: [phone]  D/C with doxycycline wound cultures pending  CHF cleared euvolemic with no dyspnea and clear lung fields

## 2014-09-26 NOTE — Discharge Instructions (Signed)

## 2014-09-26 NOTE — Discharge Summary (Signed)
Discharge Summary   Patient ID: Victor [name],  MRN: [id number], DOB/AGE: 76/19/1937 76 y.o.  Admit date: 09/23/2014 Discharge date: 09/26/2014  Primary Care Provider: [facility] Primary Cardiologist: Dr. Cristopher Peru  Discharge Diagnoses Principal Problem:   CHF (congestive heart failure), NYHA class III Active Problems:   Diabetes   Cardiomyopathy, ischemic   VENTRICULAR TACHYCARDIA   Carotid artery disease   Peripheral vascular disease   Automatic implantable cardioverter-defibrillator in situ   Anemia-chronic\   CAD (coronary artery disease) of artery bypass graft   Allergies Allergies  Allergen Reactions  . Coreg [Carvedilol] Other (See Comments)    Shortness of breath ,fatigue ,dizzyness   . Lovastatin Other (See Comments)    CK elevation, Myalgias    Hospital Course  The patient is a 78 year old Caucasian male with past medical history of HLD, DM, ischemic cardiomyopathy and significant coronary artery disease s/p CABG with pending future CTO of RCA by Dr. Martinique and Dr. Irish Lack on 11/11. Per past medical record, patient had bypass surgery at [facility] in 1990. He has a known occluded SVG to RCA and CTO of native RCA on cardiac catheterization in June 2014. His most recent cardiac catheterization in September 2015 showed occlusion of SVG to OM. His EF by Myoview was 25%. Patient presented to [facility] on 08/23/2014 complaining of worsening dyspnea on exertion, shortness breath and fatigue. On arrival to the hospital, he denied any significant chest discomfort, although he never had any chest discomfort as a manifestation of his ischemic heart disease in the past. His initial proBNP was elevated at 1645. He was also noted to have some cellulitis of the left elbow with some drainage. Wound culture was obtained on arrival. He was initially placed on Keflex. During his hospitalization, serial troponin were negative. His blood sugar was uncontrolled despite reinitiation  of his 70/30 daily insulin on top of SSI.   Given the elevated proBNP and his shortness breath, patient was placed on 60 mg twice a day dose of IV Lasix with good urine output.overall, patient was diuresed 4.4 L. His weight has decreased from 185 down to 181 which seems to be his dry weight. His O2 saturation appears to be stable on room air, although he does use oxygen at night due to night time dyspnea. He was seen in the morning of 09/26/2014, at which time he denies any significant chest discomfort or shortness breath. His creatinine has been stable at 1.05 after aggressive diuresis. He is currently at his baseline dry weight of 181 pounds. Patient has been instructed to avoid excessive fluid intake, salt restriction and the need for daily weight. He has been instructed to contact cardiology if his weight increased by more than 3 pounds overnight or 5 pounds in a single week. According to the patient, he has been noncompliant with fluid restriction in the past. However he is willing to watch his fluid intake as of this time. We have resumed his home Lasix. He will have scheduled outpatient complex CTO procedure and on 10/01/2014 by Dr. Martinique and Dr. Irish Lack. I will schedule a follow-up with our clinic in 3-4 weeks in anticipation of next weeks complex CTO procedure.  Since the patient has been having some dyspnea especially at night, I will instructed nursing staff to walk the patient to see if his O2 saturation remaining above 88%. Of note, during this hospitalization, patient's left elbow wound culture came back positive for staph aureus. Final sensitivity panel shows it is sensitive to  doxycycline. His keflex has changed to doxycycline 100 mg BID for 10 days. He has been instructed to hold Metformin 24 hours prior to cardiac cath.    Discharge Vitals Blood pressure 141/42, pulse 49, temperature 97.7 F (36.5 C), temperature source Oral, resp. rate 18, height 5\' 4"  (1.626 m), weight 182 lb 1.6 oz  (82.6 kg), SpO2 100 %.  Filed Weights   09/24/14 0528 09/25/14 0624 09/26/14 0619  Weight: 181 lb 1.6 oz (82.146 kg) 181 lb 10.5 oz (82.4 kg) 182 lb 1.6 oz (82.6 kg)    Labs  CBC  Recent Labs  09/23/14 1527  WBC 8.9  HGB 12.3*  HCT 36.5*  MCV 87.3  PLT 353   Basic Metabolic Panel  Recent Labs  09/23/14 1527  09/25/14 0505 09/26/14 0315  NA 136*  < > 137 140  K 4.7  < > 4.8 4.8  CL 95*  < > 98 99  CO2 28  < > 28 26  GLUCOSE 337*  < > 203* 245*  BUN 29*  < > 42* 43*  CREATININE 0.90  < > 0.97 1.05  CALCIUM 9.2  < > 9.0 8.7  MG 1.8  --   --   --   < > = values in this interval not displayed. Liver Function Tests  Recent Labs  09/23/14 1527  AST 20  ALT 25  ALKPHOS 69  BILITOT 0.3  PROT 6.5  ALBUMIN 3.4*   Cardiac Enzymes  Recent Labs  09/23/14 1527 09/23/14 2058 09/24/14 0058  TROPONINI <0.30 <0.30 <0.30   Hemoglobin A1C  Recent Labs  09/23/14 1527  HGBA1C 8.8*   Thyroid Function Tests  Recent Labs  09/23/14 1527  TSH 0.593    Disposition  Pt is being discharged home today in good condition.  Follow-up Plans & Appointments      Follow-up Information    Follow up with [facility], NP On 10/20/2014.   Specialty:  Cardiology   Why:  3PM   Contact information:   [address] [place] [address] [phone]       Please follow up.   Why:  Previously scheduled CTO cath on 10/01/2014 Please hold Metformin for at least 24 hrs prior to cath      Discharge Medications    Medication List    TAKE these medications        ASPIR-81 81 MG EC tablet  Generic drug:  aspirin  Take 81 mg by mouth at bedtime.     bisoprolol 10 MG tablet  Commonly known as:  ZEBETA  Take 1.5 tablets (15 mg total) by mouth daily.     clopidogrel 75 MG tablet  Commonly known as:  PLAVIX  Take 75 mg by mouth daily.     doxycycline 100 MG tablet  Commonly known as:  VIBRA-TABS  Take 1 tablet (100 mg total) by mouth every 12  (twelve) hours.     etodolac 400 MG tablet  Commonly known as:  LODINE  Take 400 mg by mouth at bedtime.     furosemide 40 MG tablet  Commonly known as:  LASIX  Take 1.5 tablets (60 mg total) by mouth 2 (two) times daily.     insulin aspart protamine- aspart (70-30) 100 UNIT/ML injection  Commonly known as:  NOVOLOG MIX 70/30  Inject 12-20 Units into the skin 2 (two) times daily with a meal. Inject 20 units in the morning and 12 units in the evening  isosorbide mononitrate 30 MG 24 hr tablet  Commonly known as:  IMDUR  Take 0.5 tablets (15 mg total) by mouth daily.     lisinopril 2.5 MG tablet  Commonly known as:  PRINIVIL,ZESTRIL  Take 2.5 mg by mouth daily.     LORCET 5-325 MG per tablet  Generic drug:  HYDROcodone-acetaminophen  Take 1 tablet by mouth every 6 (six) hours as needed for pain.     LYRICA 75 MG capsule  Generic drug:  pregabalin  Take 150 mg by mouth daily.     metFORMIN 1000 MG tablet  Commonly known as:  GLUCOPHAGE  Take 1,000 mg by mouth 2 (two) times daily with a meal.     nitroGLYCERIN 0.4 MG SL tablet  Commonly known as:  NITROSTAT  Place 0.4 mg under the tongue every 5 (five) minutes as needed for chest pain.     potassium chloride 20 MEQ packet  Commonly known as:  KLOR-CON  Take 20 mEq by mouth daily.     pravastatin 40 MG tablet  Commonly known as:  PRAVACHOL  Take 40 mg by mouth at bedtime.     SKELAXIN 800 MG tablet  Generic drug:  metaxalone  Take 800 mg by mouth 2 (two) times daily.     vitamin B-12 1000 MCG tablet  Commonly known as:  CYANOCOBALAMIN  Take 1,000 mcg by mouth daily.        Outstanding Labs/Studies  Scheduled CTO of RCA on 10/06/14  Duration of Discharge Encounter   Greater than 30 minutes including physician time.  Hilbert Corrigan PA-C Pager: [phone] 09/26/2014, 12:05 PM

## 2014-09-26 NOTE — Progress Notes (Signed)
Pharmacist HF Discharge Medication Education   Patient was educated on process for home medication reconciliation and provided written instructions as well. Patient was also counseled on HF medications and changes to be made upon arriving home. He was provided with the "Living Better with Heart Failure" patient education packet and encouraged to utilize a medication bag to be brought with him to his follow up clinic visits with his current medications.   Harolyn Rutherford, PharmD Clinical Pharmacist - Resident Pager: [phone] Pharmacy: [phone] 09/26/2014 1:01 PM

## 2014-09-26 NOTE — Progress Notes (Signed)
D/C'd telemetry; D/C'd IV; D/C'd pt; changed dressing on Left elbow and replaced with Mepilex.  Explained discharge instructions to pt, and he had no further questions.  Pt is in no acute distress.

## 2014-09-26 NOTE — Plan of Care (Signed)
Problem: Phase I Progression Outcomes Goal: Dyspnea controlled at rest (HF) Outcome: Completed/Met Date Met:  09/26/14 Goal: Pain controlled with appropriate interventions Outcome: Completed/Met Date Met:  09/26/14 Goal: EF % per last Echo/documented,Core Reminder form on chart Outcome: Completed/Met Date Met:  09/26/14 EF - 25% Goal: Up in chair, BRP Outcome: Completed/Met Date Met:  09/26/14

## 2014-09-28 ENCOUNTER — Other Ambulatory Visit: Payer: Self-pay | Admitting: Internal Medicine

## 2014-09-30 ENCOUNTER — Other Ambulatory Visit: Payer: Self-pay

## 2014-09-30 ENCOUNTER — Inpatient Hospital Stay (HOSPITAL_COMMUNITY)
Admission: EM | Admit: 2014-09-30 | Discharge: 2014-10-05 | DRG: 250 | Disposition: A | Discharge status: Home / Self Care | Payer: Medicare Other | Attending: Internal Medicine | Admitting: Internal Medicine

## 2014-09-30 ENCOUNTER — Emergency Department (HOSPITAL_COMMUNITY): Payer: Medicare Other

## 2014-09-30 ENCOUNTER — Encounter (HOSPITAL_COMMUNITY): Payer: Self-pay | Admitting: Emergency Medicine

## 2014-09-30 DIAGNOSIS — Z794 Long term (current) use of insulin: Secondary | ICD-10-CM

## 2014-09-30 DIAGNOSIS — R531 Weakness: Secondary | ICD-10-CM | POA: Diagnosis not present

## 2014-09-30 DIAGNOSIS — M1990 Unspecified osteoarthritis, unspecified site: Secondary | ICD-10-CM | POA: Diagnosis present

## 2014-09-30 DIAGNOSIS — I2582 Chronic total occlusion of coronary artery: Secondary | ICD-10-CM | POA: Diagnosis present

## 2014-09-30 DIAGNOSIS — R001 Bradycardia, unspecified: Secondary | ICD-10-CM | POA: Diagnosis present

## 2014-09-30 DIAGNOSIS — R29810 Facial weakness: Secondary | ICD-10-CM | POA: Diagnosis not present

## 2014-09-30 DIAGNOSIS — Z95810 Presence of automatic (implantable) cardiac defibrillator: Secondary | ICD-10-CM | POA: Diagnosis not present

## 2014-09-30 DIAGNOSIS — I6523 Occlusion and stenosis of bilateral carotid arteries: Secondary | ICD-10-CM | POA: Diagnosis not present

## 2014-09-30 DIAGNOSIS — E785 Hyperlipidemia, unspecified: Secondary | ICD-10-CM | POA: Diagnosis present

## 2014-09-30 DIAGNOSIS — R471 Dysarthria and anarthria: Secondary | ICD-10-CM | POA: Diagnosis not present

## 2014-09-30 DIAGNOSIS — I255 Ischemic cardiomyopathy: Secondary | ICD-10-CM | POA: Diagnosis present

## 2014-09-30 DIAGNOSIS — M6281 Muscle weakness (generalized): Secondary | ICD-10-CM | POA: Diagnosis not present

## 2014-09-30 DIAGNOSIS — S51012D Laceration without foreign body of left elbow, subsequent encounter: Secondary | ICD-10-CM

## 2014-09-30 DIAGNOSIS — Z87891 Personal history of nicotine dependence: Secondary | ICD-10-CM

## 2014-09-30 DIAGNOSIS — E860 Dehydration: Secondary | ICD-10-CM | POA: Diagnosis present

## 2014-09-30 DIAGNOSIS — I252 Old myocardial infarction: Secondary | ICD-10-CM

## 2014-09-30 DIAGNOSIS — E1159 Type 2 diabetes mellitus with other circulatory complications: Secondary | ICD-10-CM | POA: Diagnosis not present

## 2014-09-30 DIAGNOSIS — I771 Stricture of artery: Secondary | ICD-10-CM | POA: Diagnosis not present

## 2014-09-30 DIAGNOSIS — D509 Iron deficiency anemia, unspecified: Secondary | ICD-10-CM | POA: Diagnosis present

## 2014-09-30 DIAGNOSIS — I5042 Chronic combined systolic (congestive) and diastolic (congestive) heart failure: Secondary | ICD-10-CM | POA: Diagnosis not present

## 2014-09-30 DIAGNOSIS — I739 Peripheral vascular disease, unspecified: Secondary | ICD-10-CM | POA: Diagnosis present

## 2014-09-30 DIAGNOSIS — G459 Transient cerebral ischemic attack, unspecified: Secondary | ICD-10-CM

## 2014-09-30 DIAGNOSIS — E118 Type 2 diabetes mellitus with unspecified complications: Secondary | ICD-10-CM | POA: Diagnosis not present

## 2014-09-30 DIAGNOSIS — I272 Other secondary pulmonary hypertension: Secondary | ICD-10-CM | POA: Diagnosis present

## 2014-09-30 DIAGNOSIS — I059 Rheumatic mitral valve disease, unspecified: Secondary | ICD-10-CM | POA: Diagnosis not present

## 2014-09-30 DIAGNOSIS — K219 Gastro-esophageal reflux disease without esophagitis: Secondary | ICD-10-CM | POA: Diagnosis present

## 2014-09-30 DIAGNOSIS — E1165 Type 2 diabetes mellitus with hyperglycemia: Secondary | ICD-10-CM | POA: Diagnosis present

## 2014-09-30 DIAGNOSIS — R51 Headache: Secondary | ICD-10-CM | POA: Diagnosis not present

## 2014-09-30 DIAGNOSIS — I10 Essential (primary) hypertension: Secondary | ICD-10-CM

## 2014-09-30 DIAGNOSIS — Z951 Presence of aortocoronary bypass graft: Secondary | ICD-10-CM | POA: Diagnosis present

## 2014-09-30 DIAGNOSIS — G458 Other transient cerebral ischemic attacks and related syndromes: Secondary | ICD-10-CM | POA: Diagnosis not present

## 2014-09-30 DIAGNOSIS — I959 Hypotension, unspecified: Secondary | ICD-10-CM | POA: Diagnosis not present

## 2014-09-30 DIAGNOSIS — I25810 Atherosclerosis of coronary artery bypass graft(s) without angina pectoris: Secondary | ICD-10-CM | POA: Diagnosis present

## 2014-09-30 DIAGNOSIS — Z7982 Long term (current) use of aspirin: Secondary | ICD-10-CM

## 2014-09-30 DIAGNOSIS — F419 Anxiety disorder, unspecified: Secondary | ICD-10-CM | POA: Diagnosis not present

## 2014-09-30 DIAGNOSIS — Z79899 Other long term (current) drug therapy: Secondary | ICD-10-CM | POA: Diagnosis not present

## 2014-09-30 DIAGNOSIS — I5022 Chronic systolic (congestive) heart failure: Secondary | ICD-10-CM | POA: Diagnosis present

## 2014-09-30 DIAGNOSIS — E1129 Type 2 diabetes mellitus with other diabetic kidney complication: Secondary | ICD-10-CM

## 2014-09-30 DIAGNOSIS — Z7902 Long term (current) use of antithrombotics/antiplatelets: Secondary | ICD-10-CM

## 2014-09-30 DIAGNOSIS — R0602 Shortness of breath: Secondary | ICD-10-CM | POA: Diagnosis not present

## 2014-09-30 DIAGNOSIS — D638 Anemia in other chronic diseases classified elsewhere: Secondary | ICD-10-CM | POA: Diagnosis present

## 2014-09-30 DIAGNOSIS — I6340 Cerebral infarction due to embolism of unspecified cerebral artery: Secondary | ICD-10-CM | POA: Diagnosis not present

## 2014-09-30 DIAGNOSIS — I5023 Acute on chronic systolic (congestive) heart failure: Secondary | ICD-10-CM | POA: Diagnosis present

## 2014-09-30 DIAGNOSIS — I25118 Atherosclerotic heart disease of native coronary artery with other forms of angina pectoris: Secondary | ICD-10-CM | POA: Diagnosis not present

## 2014-09-30 DIAGNOSIS — M6289 Other specified disorders of muscle: Secondary | ICD-10-CM | POA: Diagnosis not present

## 2014-09-30 DIAGNOSIS — I2510 Atherosclerotic heart disease of native coronary artery without angina pectoris: Secondary | ICD-10-CM | POA: Diagnosis not present

## 2014-09-30 HISTORY — DX: Transient cerebral ischemic attack, unspecified: G45.9

## 2014-09-30 HISTORY — DX: Essential (primary) hypertension: I10

## 2014-09-30 HISTORY — DX: Hyperlipidemia, unspecified: E78.5

## 2014-09-30 HISTORY — DX: Type 2 diabetes mellitus without complications: E11.9

## 2014-09-30 LAB — CBC WITH DIFFERENTIAL/PLATELET
Basophils Absolute: 0 10*3/uL (ref 0.0–0.1)
Basophils Relative: 0 % (ref 0–1)
EOS PCT: 2 % (ref 0–5)
Eosinophils Absolute: 0.2 10*3/uL (ref 0.0–0.7)
HCT: 37.1 % — ABNORMAL LOW (ref 39.0–52.0)
HEMOGLOBIN: 12.8 g/dL — AB (ref 13.0–17.0)
LYMPHS ABS: 3.5 10*3/uL (ref 0.7–4.0)
Lymphocytes Relative: 37 % (ref 12–46)
MCH: 29.8 pg (ref 26.0–34.0)
MCHC: 34.5 g/dL (ref 30.0–36.0)
MCV: 86.3 fL (ref 78.0–100.0)
MONOS PCT: 8 % (ref 3–12)
Monocytes Absolute: 0.7 10*3/uL (ref 0.1–1.0)
Neutro Abs: 5 10*3/uL (ref 1.7–7.7)
Neutrophils Relative %: 53 % (ref 43–77)
PLATELETS: 190 10*3/uL (ref 150–400)
RBC: 4.3 MIL/uL (ref 4.22–5.81)
RDW: 15.1 % (ref 11.5–15.5)
WBC: 9.5 10*3/uL (ref 4.0–10.5)

## 2014-09-30 LAB — COMPREHENSIVE METABOLIC PANEL
ALBUMIN: 3.7 g/dL (ref 3.5–5.2)
ALT: 26 U/L (ref 0–53)
AST: 24 U/L (ref 0–37)
Alkaline Phosphatase: 87 U/L (ref 39–117)
Anion gap: 16 — ABNORMAL HIGH (ref 5–15)
BUN: 31 mg/dL — ABNORMAL HIGH (ref 6–23)
CO2: 27 mEq/L (ref 19–32)
CREATININE: 0.96 mg/dL (ref 0.50–1.35)
Calcium: 9.6 mg/dL (ref 8.4–10.5)
Chloride: 97 mEq/L (ref 96–112)
GFR calc Af Amer: >90 mL/min (ref >90)
GFR calc non Af Amer: 79 mL/min — ABNORMAL LOW (ref >90)
Glucose, Bld: 118 mg/dL — ABNORMAL HIGH (ref 70–99)
Potassium: 4.5 mEq/L (ref 3.7–5.3)
Sodium: 140 mEq/L (ref 137–147)
TOTAL PROTEIN: 6.8 g/dL (ref 6.0–8.3)
Total Bilirubin: 0.5 mg/dL (ref 0.3–1.2)

## 2014-09-30 LAB — CBG MONITORING, ED: GLUCOSE-CAPILLARY: 103 mg/dL — AB (ref 70–99)

## 2014-09-30 LAB — GLUCOSE, CAPILLARY
GLUCOSE-CAPILLARY: 213 mg/dL — AB (ref 70–99)
Glucose-Capillary: 279 mg/dL — ABNORMAL HIGH (ref 70–99)

## 2014-09-30 LAB — TROPONIN I: Troponin I: <0.3 ng/mL (ref <0.30)

## 2014-09-30 LAB — PRO B NATRIURETIC PEPTIDE: PRO B NATRI PEPTIDE: 1605 pg/mL — AB (ref 0–450)

## 2014-09-30 MED ORDER — DOXYCYCLINE HYCLATE 100 MG PO TABS
100.0000 mg | ORAL_TABLET | Freq: Two times a day (BID) | ORAL | Status: DC
  Administered 2014-09-30 – 2014-10-05 (×9): 100 mg via ORAL
  Filled 2014-09-30 (×11): qty 1

## 2014-09-30 MED ORDER — BISOPROLOL FUMARATE 10 MG PO TABS
10.0000 mg | ORAL_TABLET | Freq: Every day | ORAL | Status: DC
  Administered 2014-09-30: 10 mg via ORAL
  Filled 2014-09-30 (×2): qty 1

## 2014-09-30 MED ORDER — INSULIN ASPART PROT & ASPART (70-30 MIX) 100 UNIT/ML ~~LOC~~ SUSP: 12.0000 [IU] | Freq: Two times a day (BID) | SUBCUTANEOUS | Status: DC

## 2014-09-30 MED ORDER — POTASSIUM CHLORIDE 20 MEQ PO PACK
20.0000 meq | PACK | Freq: Every day | ORAL | Status: DC
  Filled 2014-09-30: qty 1

## 2014-09-30 MED ORDER — SODIUM CHLORIDE 0.9 % IJ SOLN: 3.0000 mL | INTRAMUSCULAR | Status: DC | PRN

## 2014-09-30 MED ORDER — METAXALONE 800 MG PO TABS
800.0000 mg | ORAL_TABLET | Freq: Two times a day (BID) | ORAL | Status: DC
  Administered 2014-09-30 – 2014-10-05 (×9): 800 mg via ORAL
  Filled 2014-09-30 (×11): qty 1

## 2014-09-30 MED ORDER — HYDROCODONE-ACETAMINOPHEN 5-325 MG PO TABS
1.0000 | ORAL_TABLET | Freq: Four times a day (QID) | ORAL | Status: DC | PRN
  Administered 2014-09-30 – 2014-10-05 (×8): 1 via ORAL
  Filled 2014-09-30 (×8): qty 1

## 2014-09-30 MED ORDER — PRAVASTATIN SODIUM 40 MG PO TABS
40.0000 mg | ORAL_TABLET | Freq: Every day | ORAL | Status: DC
  Administered 2014-09-30 – 2014-10-04 (×5): 40 mg via ORAL
  Filled 2014-09-30 (×6): qty 1

## 2014-09-30 MED ORDER — ONDANSETRON HCL 4 MG/2ML IJ SOLN
4.0000 mg | Freq: Four times a day (QID) | INTRAMUSCULAR | Status: DC | PRN
  Administered 2014-10-02: 4 mg via INTRAVENOUS
  Filled 2014-09-30: qty 2

## 2014-09-30 MED ORDER — INSULIN ASPART PROT & ASPART (70-30 MIX) 100 UNIT/ML ~~LOC~~ SUSP
20.0000 [IU] | Freq: Every day | SUBCUTANEOUS | Status: DC
  Administered 2014-10-02 – 2014-10-05 (×3): 20 [IU] via SUBCUTANEOUS
  Filled 2014-09-30: qty 10

## 2014-09-30 MED ORDER — INSULIN ASPART PROT & ASPART (70-30 MIX) 100 UNIT/ML ~~LOC~~ SUSP
12.0000 [IU] | Freq: Every day | SUBCUTANEOUS | Status: DC
  Administered 2014-09-30 – 2014-10-04 (×3): 12 [IU] via SUBCUTANEOUS
  Filled 2014-09-30: qty 10

## 2014-09-30 MED ORDER — ETODOLAC 400 MG PO TABS
400.0000 mg | ORAL_TABLET | Freq: Every day | ORAL | Status: DC
  Administered 2014-09-30 – 2014-10-05 (×5): 400 mg via ORAL
  Filled 2014-09-30 (×6): qty 1

## 2014-09-30 MED ORDER — VITAMIN B-12 1000 MCG PO TABS
1000.0000 ug | ORAL_TABLET | Freq: Every day | ORAL | Status: DC
  Administered 2014-10-01 – 2014-10-05 (×5): 1000 ug via ORAL
  Filled 2014-09-30 (×5): qty 1

## 2014-09-30 MED ORDER — INSULIN ASPART 100 UNIT/ML ~~LOC~~ SOLN
0.0000 [IU] | Freq: Three times a day (TID) | SUBCUTANEOUS | Status: DC
  Administered 2014-10-02: 4 [IU] via SUBCUTANEOUS
  Administered 2014-10-02 – 2014-10-03 (×2): 3 [IU] via SUBCUTANEOUS
  Administered 2014-10-03: 11 [IU] via SUBCUTANEOUS
  Administered 2014-10-04: 3 [IU] via SUBCUTANEOUS
  Administered 2014-10-04: 5 [IU] via SUBCUTANEOUS

## 2014-09-30 MED ORDER — ISOSORBIDE MONONITRATE 15 MG HALF TABLET
15.0000 mg | ORAL_TABLET | Freq: Every day | ORAL | Status: DC
  Administered 2014-10-02: 15 mg via ORAL
  Filled 2014-09-30 (×2): qty 1

## 2014-09-30 MED ORDER — SODIUM CHLORIDE 0.9 % IJ SOLN
3.0000 mL | Freq: Two times a day (BID) | INTRAMUSCULAR | Status: DC
  Administered 2014-09-30 – 2014-10-04 (×9): 3 mL via INTRAVENOUS

## 2014-09-30 MED ORDER — ASPIRIN EC 81 MG PO TBEC
81.0000 mg | DELAYED_RELEASE_TABLET | Freq: Every day | ORAL | Status: DC
  Administered 2014-09-30: 81 mg via ORAL
  Filled 2014-09-30 (×2): qty 1

## 2014-09-30 MED ORDER — POTASSIUM CHLORIDE CRYS ER 20 MEQ PO TBCR
20.0000 meq | EXTENDED_RELEASE_TABLET | Freq: Every day | ORAL | Status: DC
  Administered 2014-09-30 – 2014-10-05 (×5): 20 meq via ORAL
  Filled 2014-09-30 (×6): qty 1

## 2014-09-30 MED ORDER — ACETAMINOPHEN 325 MG PO TABS
650.0000 mg | ORAL_TABLET | ORAL | Status: DC | PRN
  Administered 2014-10-02: 650 mg via ORAL
  Filled 2014-09-30: qty 2

## 2014-09-30 MED ORDER — HEPARIN SODIUM (PORCINE) 5000 UNIT/ML IJ SOLN
5000.0000 [IU] | Freq: Three times a day (TID) | INTRAMUSCULAR | Status: DC
  Administered 2014-09-30 – 2014-10-01 (×2): 5000 [IU] via SUBCUTANEOUS
  Filled 2014-09-30 (×5): qty 1

## 2014-09-30 MED ORDER — SODIUM CHLORIDE 0.9 % IV SOLN: 250.0000 mL | INTRAVENOUS | Status: DC | PRN

## 2014-09-30 MED ORDER — LISINOPRIL 2.5 MG PO TABS
2.5000 mg | ORAL_TABLET | Freq: Every day | ORAL | Status: DC
  Administered 2014-09-30 – 2014-10-02 (×2): 2.5 mg via ORAL
  Filled 2014-09-30 (×3): qty 1

## 2014-09-30 MED ORDER — FUROSEMIDE 10 MG/ML IJ SOLN
80.0000 mg | Freq: Once | INTRAMUSCULAR | Status: AC
  Administered 2014-09-30: 80 mg via INTRAVENOUS
  Filled 2014-09-30: qty 8

## 2014-09-30 MED ORDER — PREGABALIN 25 MG PO CAPS
75.0000 mg | ORAL_CAPSULE | Freq: Every day | ORAL | Status: DC
  Administered 2014-09-30 – 2014-10-04 (×4): 75 mg via ORAL
  Filled 2014-09-30 (×3): qty 3
  Filled 2014-09-30: qty 1
  Filled 2014-09-30: qty 3

## 2014-09-30 MED ORDER — CLOPIDOGREL BISULFATE 75 MG PO TABS
75.0000 mg | ORAL_TABLET | Freq: Every day | ORAL | Status: DC
  Filled 2014-09-30: qty 1

## 2014-09-30 NOTE — ED Notes (Signed)
Patient transported to X-ray 

## 2014-09-30 NOTE — H&P (Signed)
Patient ID: Victor [name] MRN: [id number], DOB/AGE: 76-Apr-1937   Admit date: 09/30/2014   Primary Physician: Leonides Sake, MD Primary Cardiologist: Dr. Martinique Primary Electrophysiologist: Dr. Lovena Le  Pt. Profile:  78 year old Caucasian male with past medical history of HLD, DM, ICM and CAD s/p CABG who was recently discharged after aggressive diuresis came back with SOB  Problem List  Past Medical History  Diagnosis Date  . Arthropathy, unspecified, site unspecified   . Occlusion and stenosis of carotid artery without mention of cerebral infarction   . Raynaud's syndrome   . Congestive heart failure, unspecified   . Paroxysmal ventricular tachycardia   . Other specified forms of chronic ischemic heart disease   . Peripheral vascular disease   . Cardiomyopathy, ischemic   . Automatic implantable cardioverter-defibrillator in situ   . High cholesterol   . Myocardial infarction 1990    "4-5 while in [facility] til they put in ICD" (04/24/2013)  . Shortness of breath     "both exertion and lying down" (04/24/2013)  . GERD (gastroesophageal reflux disease)   . Osteoarthrosis, unspecified whether generalized or localized, unspecified site   . Degenerative joint disease   . Arthritis     "back and hands" (04/24/2013)  . Chronic lower back pain   . Coronary artery disease   . Type II or unspecified type diabetes mellitus without mention of complication, not stated as uncontrolled     insulin dependent    Past Surgical History  Procedure Laterality Date  . Anal fistulectomy  2000's  . Cataract extraction, bilateral Bilateral ~ 2009  . Coronary artery bypass graft  01/1989    "CABG X3" 96/02/2013)  . Cardiac defibrillator placement  02/1989-12/2011    "I've had a total of 6 or 7 defibrillators put in" (04/24/2013)  . Finger fracture surgery Left ~ 2000    "pointer" (04/24/2013)     Allergies  Allergies  Allergen Reactions  . Coreg [Carvedilol] Other (See Comments)   Shortness of breath ,fatigue ,dizzyness   . Lovastatin Other (See Comments)    CK elevation, Myalgias    HPI  The patient is a 78 year old Caucasian male with past medical history of HLD, DM, ICM and CAD s/p CABG. Patient has a previously scheduled PCI of CTO of RCA by Dr. Martinique and Dr. Irish Lack on 10/01/2014. He initially underwent bypass surgery at [facility] in 1990. He has known occluded SVG to RCA and CTO of the native RCA on cardiac catheterization in June 2014. His most recent cardiac cath in September 2015 showed occlusion of the SVG to OM. His EF Myoview was 25% earlier this year. Despite maximal medical therapy, patient continued to have significant dyspnea with exertion. After extensive discussion with the patient, it was decided for the patient to undergo high-risk PCI CTO of RCA.   He was recently admitted to [facility] on 09/23/2014 complaining of worsening dyspnea on exertion, shortness breath and fatigue. He was noted to also have left elbow wound with some drainage. He was initially placed on Keflex, after culture came back, he was transitioned to doxycycline. Patient was placed on 60 mg twice a day of IV Lasix and was diuresed 4.4 L prior to discharge. Of note, his initial proBNP was 1600 on previous admission and 1100 on discharge. His weight on discharge was 181 pounds. Prior to discharge, patient was having some dyspnea especially at night, however O2 sat were stable, since his heart failure symptom was resolved, he was eventually  discharged on 09/26/2014.  Since discharge, patient continued to have occasional shortness breath. He denies any frequent chest pain, however his shortness of breath has been worsening despite strict fluid restriction. Patient woke up in the morning of 09/30/2014 with significant dyspnea and R arm pain, prompting him to present to [facility] for further evaluation. On presentation his blood pressure was 113/58. Heart rate 57. Occasionally, patient  has some significant bradycardia. Laboratory finding include creatinine of 0.96, hemoglobin 12.8, troponin negative, proBNP 1605. Chest x-ray was negative for acute process. EKG showed sinus bradycardia with first degree heart block, poor R wave progression anterior lead, nonspecific T-wave changes. Cardiology was consulted for shortness of breath.  Home Medications  Prior to Admission medications   Medication Sig Start Date End Date Taking? Authorizing Provider  aspirin (ASPIR-81) 81 MG EC tablet Take 81 mg by mouth at bedtime.    Yes Historical Provider, MD  bisoprolol (ZEBETA) 10 MG tablet Take 1.5 tablets (15 mg total) by mouth daily. 09/16/14  Yes Evans Lance, MD  clopidogrel (PLAVIX) 75 MG tablet TAKE 1 TABLET EVERY DAY WITH BREAKFAST 09/29/14  Yes Evans Lance, MD  doxycycline (VIBRA-TABS) 100 MG tablet Take 1 tablet (100 mg total) by mouth every 12 (twelve) hours. 09/26/14  Yes Almyra Deforest, PA  etodolac (LODINE) 400 MG tablet Take 400 mg by mouth daily.    Yes Historical Provider, MD  furosemide (LASIX) 40 MG tablet Take 1.5 tablets (60 mg total) by mouth 2 (two) times daily. 08/11/14  Yes Evans Lance, MD  HYDROcodone-acetaminophen (LORCET) 5-325 MG per tablet Take 1 tablet by mouth every 6 (six) hours as needed for pain.   Yes Historical Provider, MD  insulin aspart protamine- aspart (NOVOLOG MIX 70/30) (70-30) 100 UNIT/ML injection Inject 12-20 Units into the skin 2 (two) times daily with a meal. Inject 20 units in the morning and 12 units in the evening   Yes Historical Provider, MD  isosorbide mononitrate (IMDUR) 30 MG 24 hr tablet Take 0.5 tablets (15 mg total) by mouth daily. 09/16/14  Yes Evans Lance, MD  lisinopril (PRINIVIL,ZESTRIL) 2.5 MG tablet Take 2.5 mg by mouth daily.   Yes Historical Provider, MD  metFORMIN (GLUCOPHAGE) 1000 MG tablet Take 1,000 mg by mouth 2 (two) times daily with a meal.   Yes Historical Provider, MD  nitroGLYCERIN (NITROSTAT) 0.4 MG SL tablet Place  0.4 mg under the tongue every 5 (five) minutes as needed for chest pain.   Yes Historical Provider, MD  potassium chloride (KLOR-CON) 20 MEQ packet Take 20 mEq by mouth daily. 08/20/14  Yes Evans Lance, MD  pravastatin (PRAVACHOL) 40 MG tablet Take 40 mg by mouth at bedtime.    Yes Historical Provider, MD  pregabalin (LYRICA) 75 MG capsule Take 75 mg by mouth daily.    Yes Historical Provider, MD  vitamin B-12 (CYANOCOBALAMIN) 1000 MCG tablet Take 1,000 mcg by mouth daily.   Yes Historical Provider, MD  clopidogrel (PLAVIX) 75 MG tablet Take 75 mg by mouth daily.    Historical Provider, MD  metaxalone (SKELAXIN) 800 MG tablet Take 800 mg by mouth 2 (two) times daily.      Historical Provider, MD    Family History  Family History  Problem Relation Age of Onset  . Heart failure Mother 4  . Heart attack Father   . Leukemia Sister   . Stomach cancer Maternal Grandfather     Social History  History   Social  History  . Marital Status: Married    Spouse Name: N/A    Number of Children: N/A  . Years of Education: N/A   Occupational History  . Not on file.   Social History Main Topics  . Smoking status: Never Smoker   . Smokeless tobacco: Never Used  . Alcohol Use: Yes     Comment: 04/24/2013 "wine w/dinner 15-20 yr ago"  . Drug Use: No  . Sexual Activity: Not on file   Other Topics Concern  . 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      Review of Systems General:  No chills, fever, night sweats or weight changes.  Cardiovascular:  No chest pain, edema, orthopnea, palpitations, paroxysmal nocturnal dyspnea. +dyspnea on exertion, +R arm pain Dermatological: No rash, lesions/masses Respiratory: No cough +dyspnea Urologic: No hematuria, dysuria Abdominal:   No nausea, vomiting, diarrhea, bright red blood per rectum,  melena, or hematemesis Neurologic:  No visual changes, changes in mental status. +wkns All other systems reviewed and are otherwise negative except as noted above.  Physical Exam  Blood pressure 138/49, pulse 42, temperature 97.5 F (36.4 C), temperature source Oral, resp. rate 22, SpO2 97 %.  General: Pleasant, NAD Psych: Normal affect. Neuro: Alert and oriented X 3. Moves all extremities spontaneously. HEENT: Normal  Neck: Supple without bruits. Mild JVD. Lungs:  Resp regular and unlabored, CTA. Heart: bradycardic no s3, s4, or murmurs. Abdomen: Soft, non-tender, non-distended, BS + x 4.  Extremities: No clubbing, cyanosis or edema. DP/PT/Radials 2+ and equal bilaterally.  Labs  Troponin (Point of Care Test) No results for input(s): TROPIPOC in the last 72 hours.  Recent Labs  09/30/14 1055  TROPONINI <0.30   Lab Results  Component Value Date   WBC 9.5 09/30/2014   HGB 12.8* 09/30/2014   HCT 37.1* 09/30/2014   MCV 86.3 09/30/2014   PLT 190 09/30/2014    Recent Labs Lab 09/30/14 1055  NA 140  K 4.5  CL 97  CO2 27  BUN 31*  CREATININE 0.96  CALCIUM 9.6  PROT 6.8  BILITOT 0.5  ALKPHOS 87  ALT 26  AST 24  GLUCOSE 118*   No results found for: CHOL, HDL, LDLCALC, TRIG No results found for: DDIMER   Radiology/Studies  Dg Chest 2 View  09/30/2014   CLINICAL DATA:  One week history of difficulty breathing  EXAM: CHEST  2 VIEW  COMPARISON:  September 23, 2014  FINDINGS: There is no edema or consolidation. Heart is borderline enlarged with pulmonary vascular within normal limits. Pacemaker lead positions are unchanged. No pneumothorax. No adenopathy. There is old rib trauma on both sides, healed. Patient is status post coronary artery bypass grafting.  IMPRESSION: No edema or consolidation. No change in cardiac silhouette. Old rib trauma bilaterally. No pneumothorax.   Electronically Signed   By: Lowella Grip M.D.   On: 09/30/2014 11:13   Dg Chest 2  View  09/23/2014   CLINICAL DATA:  Shortness of breath.  EXAM: CHEST  2 VIEW  COMPARISON:  May 31, 2013.  FINDINGS: Stable cardiomediastinal silhouette. Both lungs are clear. Status post coronary artery bypass graft. No pneumothorax or pleural effusion is noted. Left-sided pacemaker is unchanged in position. The visualized skeletal structures are unremarkable.  IMPRESSION: No acute cardiopulmonary abnormality seen.   Electronically  Signed   By: Sabino Dick M.D.   On: 09/23/2014 10:31    ECG  EKG showed sinus bradycardia with first degree heart block, poor R wave progression anterior lead, nonspecific T-wave changes.  Echocardiogram 04/25/2013  Study Conclusions  - Left ventricle: Extremely poor acoustic windows limit study. Overal LV systolic function appears depress, at least moderately; some images appear severe. Inferior and posterior walls appear severely hypokinetic to akinetic. The cavity size was normal. Wall thickness was normal. Doppler parameters are consistent with abnormal left ventricular relaxation (grade 1 diastolic dysfunction). - Mitral valve: Mild regurgitation. - Left atrium: The atrium was moderately dilated.    ASSESSMENT AND PLAN  1. SOB  - unclear cause, proBNP elevated from 4 days ago, however exam shows no obvious fluid overload. CXR negative  - ?if related to underlying known ischemic  - conflicting sign of physical exam, CXR and proBNP, start IV lasix, may require RHC during tomorrow CTO procedure to further assess  2. L elbow  - culture from last admission, +staph, on 10 day course of doxycycline  - will get wound care to take a look. May need ID input.   3. CAD s/p CABG 1990  - occluded SVG to RCA and CTO of the native RCA on cardiac catheterization in June 2014  - cath in September 2015 showed occlusion of the SVG to OM  - pending CTO procedure tomorrow, given conflicting sign of heart failure, may consider R heart cath as well  4.  Ischemic cardiomyopathy EF 25% on cath  - consider Echo, last echo 04/2013  5. Bradycardia: will scale back bisoprolol to 10mg  6. HLD 7. DM - difficult to control   Signed, Almyra Deforest, PA-C 09/30/2014, 1:35 PM  Patient seen and examined with Almyra Deforest, PA-C. We discussed all aspects of the encounter. I agree with the assessment and plan as stated above.  Very difficult situation. Patient with severe ischemic CM with EF 25%. On Myoview there is dense scar throughout entire inferolateral wall. He is now on for possible CTO of RCA. He denies CP but over last 2 months he has had marked progression of his HF symptoms and now IIIB-IV with extremely limited functional capacity. Volume status minimally elevated at the worst. The question is whether CTO of his RCA with increase his EF enough to improve his HF significantly or if we should focus more on considering advanced HF therapies such as VAD.   I have d/w Dr. Martinique and plan will be to admit him tonight. Give 1 dose IV lasix. Cut bisoprolol to 10. Will get echo and plan RHC prior to CTO attempt tomorrow. If cardiac index 1.5 or less would likely be reasonable to defer CTO and consider advanced HF therapies as first-line strategy.  Wound care to see for elbow staph infection.   Daniel Bensimhon,MD 4:17 PM

## 2014-09-30 NOTE — ED Notes (Signed)
CBG is 103. Notified Nurse Levada Dy.

## 2014-09-30 NOTE — Plan of Care (Signed)
Problem: Phase I Progression Outcomes Goal: O2 sats > or equal 90% or at baseline Outcome: Completed/Met Date Met:  09/30/14

## 2014-09-30 NOTE — ED Notes (Signed)
Pt c/o SOB this am; pt seen last week for same and to have cath tomorrow; pt noted to be hyperventilating at present; pt sts some fluid on lungs during last visit

## 2014-09-30 NOTE — Plan of Care (Signed)
Problem: Phase I Progression Outcomes Goal: Hemodynamically stable Outcome: Completed/Met Date Met:  09/30/14  Problem: Phase II Progression Outcomes Goal: Tolerating diet Outcome: Completed/Met Date Met:  09/30/14     

## 2014-09-30 NOTE — ED Provider Notes (Signed)
CSN: [id number]     Arrival date & time 09/30/14  5397 History   First MD Initiated Contact with Patient 09/30/14 [phone]     Chief Complaint  Patient presents with  . Shortness of Breath     (Consider location/radiation/quality/duration/timing/severity/associated sxs/prior Treatment) HPI  78 year old male presents with recurrent and worsening dyspnea over the last couple days. One week ago he was admitted to the hospital for similar shortness of breath. Since approximately 4-5 days ago after discharge from the hospital he's been having worsening dyspnea. He is unable to walk nearly as far as when he is well. He states he is within 1 pound of his discharge weight. Denies any new leg swelling. He is having worsening orthopnea and keep waking up in the middle the night despite being on multiple pillows. Denies any chest pain at all. Last night/this morning was the worst he took 2 nitroglycerin to see if it would help with her shortness of breath. He still remains dyspneic although is better at rest. The best he felt was when he was on oxygen in the hospital but did not qualify for home O2 as his oxygen was above 88%. He is due to have a new coronary procedure by Dr. Martinique tomorrow but felt like he couldn't wait because of his dyspnea. His left elbow was infected with staph and he is currently taking his doxy but states his elbow has been hurting more over past 1 day. Mostly hurts when he tries to place his elbow down to help push himself up.  Past Medical History  Diagnosis Date  . Arthropathy, unspecified, site unspecified   . Occlusion and stenosis of carotid artery without mention of cerebral infarction   . Raynaud's syndrome   . Congestive heart failure, unspecified   . Paroxysmal ventricular tachycardia   . Other specified forms of chronic ischemic heart disease   . Peripheral vascular disease   . Cardiomyopathy, ischemic   . Automatic implantable cardioverter-defibrillator in situ   . High  cholesterol   . Myocardial infarction 1990    "4-5 while in [facility] til they put in ICD" (04/24/2013)  . Shortness of breath     "both exertion and lying down" (04/24/2013)  . GERD (gastroesophageal reflux disease)   . Osteoarthrosis, unspecified whether generalized or localized, unspecified site   . Degenerative joint disease   . Arthritis     "back and hands" (04/24/2013)  . Chronic lower back pain   . Coronary artery disease   . Type II or unspecified type diabetes mellitus without mention of complication, not stated as uncontrolled     insulin dependent   Past Surgical History  Procedure Laterality Date  . Anal fistulectomy  2000's  . Cataract extraction, bilateral Bilateral ~ 2009  . Coronary artery bypass graft  01/1989    "CABG X3" 96/02/2013)  . Cardiac defibrillator placement  02/1989-12/2011    "I've had a total of 6 or 7 defibrillators put in" (04/24/2013)  . Finger fracture surgery Left ~ 2000    "pointer" (04/24/2013)   Family History  Problem Relation Age of Onset  . Heart failure Mother 42  . Heart attack Father   . Leukemia Sister   . Stomach cancer Maternal Grandfather    History  Substance Use Topics  . Smoking status: Never Smoker   . Smokeless tobacco: Never Used  . Alcohol Use: Yes     Comment: 04/24/2013 "wine w/dinner 15-20 yr ago"    Review of Systems  Constitutional: Negative for fever.  Respiratory: Positive for shortness of breath. Negative for cough.   Cardiovascular: Negative for chest pain and leg swelling.  Gastrointestinal: Negative for vomiting.  Musculoskeletal: Positive for arthralgias.  All other systems reviewed and are negative.     Allergies  Coreg and Lovastatin  Home Medications   Prior to Admission medications   Medication Sig Start Date End Date Taking? Authorizing Provider  aspirin (ASPIR-81) 81 MG EC tablet Take 81 mg by mouth at bedtime.    Yes Historical Provider, MD  bisoprolol (ZEBETA) 10 MG tablet Take 1.5 tablets (15  mg total) by mouth daily. 09/16/14  Yes Evans Lance, MD  clopidogrel (PLAVIX) 75 MG tablet TAKE 1 TABLET EVERY DAY WITH BREAKFAST 09/29/14  Yes Evans Lance, MD  doxycycline (VIBRA-TABS) 100 MG tablet Take 1 tablet (100 mg total) by mouth every 12 (twelve) hours. 09/26/14  Yes Almyra Deforest, PA  etodolac (LODINE) 400 MG tablet Take 400 mg by mouth daily.    Yes Historical Provider, MD  furosemide (LASIX) 40 MG tablet Take 1.5 tablets (60 mg total) by mouth 2 (two) times daily. 08/11/14  Yes Evans Lance, MD  HYDROcodone-acetaminophen (LORCET) 5-325 MG per tablet Take 1 tablet by mouth every 6 (six) hours as needed for pain.   Yes Historical Provider, MD  insulin aspart protamine- aspart (NOVOLOG MIX 70/30) (70-30) 100 UNIT/ML injection Inject 12-20 Units into the skin 2 (two) times daily with a meal. Inject 20 units in the morning and 12 units in the evening   Yes Historical Provider, MD  isosorbide mononitrate (IMDUR) 30 MG 24 hr tablet Take 0.5 tablets (15 mg total) by mouth daily. 09/16/14  Yes Evans Lance, MD  lisinopril (PRINIVIL,ZESTRIL) 2.5 MG tablet Take 2.5 mg by mouth daily.   Yes Historical Provider, MD  metFORMIN (GLUCOPHAGE) 1000 MG tablet Take 1,000 mg by mouth 2 (two) times daily with a meal.   Yes Historical Provider, MD  nitroGLYCERIN (NITROSTAT) 0.4 MG SL tablet Place 0.4 mg under the tongue every 5 (five) minutes as needed for chest pain.   Yes Historical Provider, MD  potassium chloride (KLOR-CON) 20 MEQ packet Take 20 mEq by mouth daily. 08/20/14  Yes Evans Lance, MD  pravastatin (PRAVACHOL) 40 MG tablet Take 40 mg by mouth at bedtime.    Yes Historical Provider, MD  pregabalin (LYRICA) 75 MG capsule Take 75 mg by mouth daily.    Yes Historical Provider, MD  vitamin B-12 (CYANOCOBALAMIN) 1000 MCG tablet Take 1,000 mcg by mouth daily.   Yes Historical Provider, MD  clopidogrel (PLAVIX) 75 MG tablet Take 75 mg by mouth daily.    Historical Provider, MD  metaxalone (SKELAXIN)  800 MG tablet Take 800 mg by mouth 2 (two) times daily.      Historical Provider, MD   BP 113/58 mmHg  Pulse 57  Temp(Src) 97.5 F (36.4 C) (Oral)  Resp 26  SpO2 100% Physical Exam  Constitutional: He is oriented to person, place, and time. He appears well-developed and well-nourished. No distress.  HENT:  Head: Normocephalic and atraumatic.  Right Ear: External ear normal.  Left Ear: External ear normal.  Nose: Nose normal.  Eyes: Right eye exhibits no discharge. Left eye exhibits no discharge.  Neck: Neck supple.  Cardiovascular: Normal rate, regular rhythm, normal heart sounds and intact distal pulses.   Pulmonary/Chest: Effort normal and breath sounds normal. He has no wheezes. He has no rales.  Abdominal: Soft. He  exhibits no distension. There is no tenderness.  Musculoskeletal: He exhibits no edema.       Left elbow: He exhibits normal range of motion and no swelling. Tenderness found.       Arms: Neurological: He is alert and oriented to person, place, and time.  Skin: Skin is warm and dry. He is not diaphoretic.  Nursing note and vitals reviewed.   ED Course  Procedures (including critical care time) Labs Review Labs Reviewed  PRO B NATRIURETIC PEPTIDE - Abnormal; Notable for the following:    Pro B Natriuretic peptide (BNP) 1605.0 (*)    All other components within normal limits  COMPREHENSIVE METABOLIC PANEL - Abnormal; Notable for the following:    Glucose, Bld 118 (*)    BUN 31 (*)    GFR calc non Af Amer 79 (*)    Anion gap 16 (*)    All other components within normal limits  CBC WITH DIFFERENTIAL - Abnormal; Notable for the following:    Hemoglobin 12.8 (*)    HCT 37.1 (*)    All other components within normal limits  CBG MONITORING, ED - Abnormal; Notable for the following:    Glucose-Capillary 103 (*)    All other components within normal limits  TROPONIN I  TROPONIN I  TROPONIN I  TROPONIN I  BASIC METABOLIC PANEL    Imaging Review Dg Chest 2  View  09/30/2014   CLINICAL DATA:  One week history of difficulty breathing  EXAM: CHEST  2 VIEW  COMPARISON:  September 23, 2014  FINDINGS: There is no edema or consolidation. Heart is borderline enlarged with pulmonary vascular within normal limits. Pacemaker lead positions are unchanged. No pneumothorax. No adenopathy. There is old rib trauma on both sides, healed. Patient is status post coronary artery bypass grafting.  IMPRESSION: No edema or consolidation. No change in cardiac silhouette. Old rib trauma bilaterally. No pneumothorax.   Electronically Signed   By: Lowella Grip M.D.   On: 09/30/2014 11:13     EKG Interpretation   Date/Time:  Tuesday September 30 2014 09:44:44 EST Ventricular Rate:  56 PR Interval:  272 QRS Duration: 116 QT Interval:  438 QTC Calculation: 422 R Axis:   -3 Text Interpretation:  Sinus bradycardia with sinus arrhythmia with 1st  degree A-V block Inferior infarct , age undetermined Cannot rule out  Anterior infarct , age undetermined Abnormal ECG No significant change  since last tracing Confirmed by [place] ([id number]) on 09/30/2014  11:15:56 AM      MDM   Final diagnoses:  SOB (shortness of breath)    Patient with recurrent dyspnea. Elbow appears to be healing well, no swelling or decreased ROM to suggest septic joint. Given recurrent dyspnea with cardiac history, cards was consulted and will admit.     Ephraim Hamburger, MD 09/30/14 1726

## 2014-09-30 NOTE — ED Notes (Signed)
Attempted report 

## 2014-09-30 NOTE — ED Notes (Signed)
I gave the patient a happy meal and a diet coke. 

## 2014-10-01 ENCOUNTER — Encounter (HOSPITAL_COMMUNITY)
Admission: EM | Disposition: A | Discharge status: Home / Self Care | Payer: Self-pay | Source: Home / Self Care | Attending: Internal Medicine

## 2014-10-01 ENCOUNTER — Ambulatory Visit (HOSPITAL_COMMUNITY): Admission: RE | Admit: 2014-10-01 | Payer: Medicare Other | Source: Ambulatory Visit | Admitting: Cardiology

## 2014-10-01 ENCOUNTER — Other Ambulatory Visit: Payer: Self-pay

## 2014-10-01 DIAGNOSIS — I2510 Atherosclerotic heart disease of native coronary artery without angina pectoris: Principal | ICD-10-CM

## 2014-10-01 DIAGNOSIS — I059 Rheumatic mitral valve disease, unspecified: Secondary | ICD-10-CM

## 2014-10-01 DIAGNOSIS — R001 Bradycardia, unspecified: Secondary | ICD-10-CM

## 2014-10-01 DIAGNOSIS — I2582 Chronic total occlusion of coronary artery: Secondary | ICD-10-CM

## 2014-10-01 DIAGNOSIS — E785 Hyperlipidemia, unspecified: Secondary | ICD-10-CM

## 2014-10-01 HISTORY — PX: RIGHT HEART CATHETERIZATION: SHX5447

## 2014-10-01 HISTORY — PX: CARDIAC CATHETERIZATION: SHX172

## 2014-10-01 LAB — GLUCOSE, CAPILLARY
Glucose-Capillary: 175 mg/dL — ABNORMAL HIGH (ref 70–99)
Glucose-Capillary: 186 mg/dL — ABNORMAL HIGH (ref 70–99)
Glucose-Capillary: 215 mg/dL — ABNORMAL HIGH (ref 70–99)

## 2014-10-01 LAB — POCT I-STAT 3, ART BLOOD GAS (G3+)
ACID-BASE EXCESS: 4 mmol/L — AB (ref 0.0–2.0)
BICARBONATE: 28.9 meq/L — AB (ref 20.0–24.0)
O2 Saturation: 94 %
PH ART: 7.423 (ref 7.350–7.450)
PO2 ART: 69 mmHg — AB (ref 80.0–100.0)
TCO2: 30 mmol/L (ref 0–100)
pCO2 arterial: 44.3 mmHg (ref 35.0–45.0)

## 2014-10-01 LAB — POCT ACTIVATED CLOTTING TIME
ACTIVATED CLOTTING TIME: 146 s
ACTIVATED CLOTTING TIME: 275 s
ACTIVATED CLOTTING TIME: 275 s
ACTIVATED CLOTTING TIME: 326 s
Activated Clotting Time: 230 seconds
Activated Clotting Time: 304 seconds
Activated Clotting Time: 292 seconds
Activated Clotting Time: 321 seconds
Activated Clotting Time: 275 seconds
Activated Clotting Time: 304 seconds
Activated Clotting Time: 275 seconds
Activated Clotting Time: 180 seconds

## 2014-10-01 LAB — CBC
HCT: 38.9 % — ABNORMAL LOW (ref 39.0–52.0)
Hemoglobin: 13.2 g/dL (ref 13.0–17.0)
MCH: 29.6 pg (ref 26.0–34.0)
MCHC: 33.9 g/dL (ref 30.0–36.0)
MCV: 87.2 fL (ref 78.0–100.0)
Platelets: 174 10*3/uL (ref 150–400)
RBC: 4.46 MIL/uL (ref 4.22–5.81)
RDW: 15 % (ref 11.5–15.5)
WBC: 9.7 10*3/uL (ref 4.0–10.5)

## 2014-10-01 LAB — TROPONIN I

## 2014-10-01 LAB — POCT I-STAT 3, VENOUS BLOOD GAS (G3P V)
Acid-Base Excess: 5 mmol/L — ABNORMAL HIGH (ref 0.0–2.0)
Bicarbonate: 30.8 mEq/L — ABNORMAL HIGH (ref 20.0–24.0)
O2 Saturation: 65 %
PCO2 VEN: 48.1 mmHg (ref 45.0–50.0)
PH VEN: 7.414 — AB (ref 7.250–7.300)
TCO2: 32 mmol/L (ref 0–100)
pO2, Ven: 34 mmHg (ref 30.0–45.0)

## 2014-10-01 LAB — BASIC METABOLIC PANEL
ANION GAP: 15 (ref 5–15)
BUN: 35 mg/dL — ABNORMAL HIGH (ref 6–23)
CALCIUM: 9.4 mg/dL (ref 8.4–10.5)
CHLORIDE: 99 meq/L (ref 96–112)
CO2: 26 mEq/L (ref 19–32)
Creatinine, Ser: 1.07 mg/dL (ref 0.50–1.35)
GFR calc Af Amer: 76 mL/min — ABNORMAL LOW (ref >90)
GFR calc non Af Amer: 65 mL/min — ABNORMAL LOW (ref >90)
Glucose, Bld: 164 mg/dL — ABNORMAL HIGH (ref 70–99)
Potassium: 4.3 mEq/L (ref 3.7–5.3)
Sodium: 140 mEq/L (ref 137–147)

## 2014-10-01 LAB — CREATININE, SERUM
CREATININE: 1 mg/dL (ref 0.50–1.35)
GFR calc Af Amer: 82 mL/min — ABNORMAL LOW (ref >90)
GFR, EST NON AFRICAN AMERICAN: 71 mL/min — AB (ref >90)

## 2014-10-01 LAB — MRSA PCR SCREENING: MRSA by PCR: NEGATIVE

## 2014-10-01 SURGERY — RIGHT HEART CATH

## 2014-10-01 MED ORDER — HEPARIN (PORCINE) IN NACL 2-0.9 UNIT/ML-% IJ SOLN
INTRAMUSCULAR | Status: AC
  Filled 2014-10-01: qty 500

## 2014-10-01 MED ORDER — LIDOCAINE HCL (PF) 1 % IJ SOLN
INTRAMUSCULAR | Status: AC
  Filled 2014-10-01: qty 30

## 2014-10-01 MED ORDER — HEPARIN SODIUM (PORCINE) 1000 UNIT/ML IJ SOLN
INTRAMUSCULAR | Status: AC
  Filled 2014-10-01: qty 1

## 2014-10-01 MED ORDER — FENTANYL CITRATE 0.05 MG/ML IJ SOLN
INTRAMUSCULAR | Status: AC
  Filled 2014-10-01: qty 2

## 2014-10-01 MED ORDER — ATROPINE SULFATE 0.1 MG/ML IJ SOLN
INTRAMUSCULAR | Status: AC
  Filled 2014-10-01: qty 10

## 2014-10-01 MED ORDER — SODIUM CHLORIDE 0.9 % IV SOLN: 1.0000 mL/kg/h | INTRAVENOUS | Status: AC

## 2014-10-01 MED ORDER — BISOPROLOL FUMARATE 5 MG PO TABS
5.0000 mg | ORAL_TABLET | Freq: Every day | ORAL | Status: DC
  Filled 2014-10-01 (×2): qty 1

## 2014-10-01 MED ORDER — NITROGLYCERIN 1 MG/10 ML FOR IR/CATH LAB
INTRA_ARTERIAL | Status: AC
  Filled 2014-10-01: qty 10

## 2014-10-01 MED ORDER — MIDAZOLAM HCL 2 MG/2ML IJ SOLN
INTRAMUSCULAR | Status: AC
  Filled 2014-10-01: qty 2

## 2014-10-01 MED ORDER — HEPARIN SODIUM (PORCINE) 5000 UNIT/ML IJ SOLN
5000.0000 [IU] | Freq: Three times a day (TID) | INTRAMUSCULAR | Status: DC
  Administered 2014-10-02 – 2014-10-05 (×10): 5000 [IU] via SUBCUTANEOUS
  Filled 2014-10-01 (×14): qty 1

## 2014-10-01 MED ORDER — SODIUM CHLORIDE 0.9 % IJ SOLN: 3.0000 mL | Freq: Two times a day (BID) | INTRAMUSCULAR | Status: DC

## 2014-10-01 MED ORDER — TRIPLE ANTIBIOTIC 3.5-400-5000 EX OINT
TOPICAL_OINTMENT | Freq: Every day | CUTANEOUS | Status: DC
  Administered 2014-10-02 – 2014-10-05 (×4): via TOPICAL
  Filled 2014-10-01: qty 14

## 2014-10-01 MED ORDER — HEPARIN (PORCINE) IN NACL 2-0.9 UNIT/ML-% IJ SOLN
INTRAMUSCULAR | Status: AC
  Filled 2014-10-01: qty 1500

## 2014-10-01 MED ORDER — CLOPIDOGREL BISULFATE 75 MG PO TABS
75.0000 mg | ORAL_TABLET | Freq: Every day | ORAL | Status: DC
  Administered 2014-10-02 – 2014-10-05 (×4): 75 mg via ORAL
  Filled 2014-10-01 (×4): qty 1

## 2014-10-01 MED ORDER — SODIUM CHLORIDE 0.9 % IJ SOLN: 3.0000 mL | INTRAMUSCULAR | Status: DC | PRN

## 2014-10-01 MED ORDER — SODIUM CHLORIDE 0.9 % IV SOLN: INTRAVENOUS | Status: DC

## 2014-10-01 MED ORDER — ASPIRIN 81 MG PO CHEW
81.0000 mg | CHEWABLE_TABLET | Freq: Every day | ORAL | Status: DC
  Administered 2014-10-01 – 2014-10-04 (×4): 81 mg via ORAL
  Filled 2014-10-01 (×5): qty 1

## 2014-10-01 MED ORDER — SODIUM CHLORIDE 0.9 % IV SOLN: 250.0000 mL | INTRAVENOUS | Status: DC | PRN

## 2014-10-01 NOTE — Progress Notes (Signed)
UR completed [place] Glaus K. Ashrita Chrismer, RN, BSN, [place], CCM  10/01/2014 12:05 PM

## 2014-10-01 NOTE — Progress Notes (Signed)
Patient Name: Victor [name] Date of Encounter: 10/01/2014  Active Problems:   SOB (shortness of breath)   Primary Cardiologist: Dr. Martinique  Patient Profile: Victor [name] is a 78 year old Caucasian male with PMH of HLD, DM, ICM and CAD s/p CABG (x3, 1990) who was recently discharged on 09/26/2014 after aggressive diuresis and presented back to the ED on 09/30/2014 with increasing SOB.  SUBJECTIVE: Experienced episode of PND last night from 3:00 - 4:00 and says it gradually improved and he was able to go back to sleep. Says the Lasix yesterday evening did not help with his SOB. Denies any chest pain or palpitations.  OBJECTIVE Filed Vitals:   09/30/14 1730 09/30/14 1809 09/30/14 2056 10/01/14 0225  BP: 149/65 133/51 135/41 127/33  Pulse: 88 61 77 51  Temp:  97.8 F (36.6 C) 97.7 F (36.5 C) 98.4 F (36.9 C)  TempSrc:  Oral Oral Oral  Resp: 23 20 20 20   Height:  5\' 8"  (1.727 m)    Weight:  176 lb 12.8 oz (80.196 kg)  176 lb 12.9 oz (80.2 kg)  SpO2: 97% 95% 95% 98%    Intake/Output Summary (Last 24 hours) at 10/01/14 0757 Last data filed at 10/01/14 0225  Gross per 24 hour  Intake    240 ml  Output   1595 ml  Net  -1355 ml   Filed Weights   09/30/14 1809 10/01/14 0225  Weight: 176 lb 12.8 oz (80.196 kg) 176 lb 12.9 oz (80.2 kg)    PHYSICAL EXAM General: Well developed, well nourished, male in no acute distress. Head: Normocephalic, atraumatic.  Neck: Supple without bruits, JVD elevated ~ 7cm. Lungs:  Resp regular and unlabored, CTA without wheezing or rales. Heart: RRR, S1, S2, no S3, S4, or murmur; no rub. Abdomen: Soft, non-tender, non-distended, BS + x 4.  Extremities: No clubbing, no cyanosis, no edema. Distal pulses 2+ at lower extremities. Neuro: Alert and oriented X 3. Moves all extremities spontaneously. Psych: Normal affect.  LABS: CBC: Recent Labs  09/30/14 1055  WBC 9.5  NEUTROABS 5.0  HGB 12.8*  HCT 37.1*  MCV 86.3  PLT 542   Basic  Metabolic Panel: Recent Labs  09/30/14 1055 10/01/14 0430  NA 140 140  K 4.5 4.3  CL 97 99  CO2 27 26  GLUCOSE 118* 164*  BUN 31* 35*  CREATININE 0.96 1.07  CALCIUM 9.6 9.4   Liver Function Tests: Recent Labs  09/30/14 1055  AST 24  ALT 26  ALKPHOS 87  BILITOT 0.5  PROT 6.8  ALBUMIN 3.7   Cardiac Enzymes: Recent Labs  09/30/14 1835 09/30/14 2300 10/01/14 0430  TROPONINI <0.30 <0.30 <0.30   BNP: PRO B NATRIURETIC PEPTIDE (BNP)  Date/Time Value Ref Range Status  09/30/2014 10:55 AM 1605.0* 0 - 450 pg/mL Final  09/25/2014 05:05 AM 1195.0* 0 - 450 pg/mL Final   TELE: Sinus bradycardia with rate staying around 40 -55. No ventricular arrhythmias.  ECG: 09/30/2014 - Sinus bradycardia with 1st degree heart block and non-specific T-wave changes.  Radiology/Studies: Dg Chest 2 View - 09/30/2014   CLINICAL DATA:  One week history of difficulty breathing  EXAM: CHEST  2 VIEW  COMPARISON:  September 23, 2014  FINDINGS: There is no edema or consolidation. Heart is borderline enlarged with pulmonary vascular within normal limits. Pacemaker lead positions are unchanged. No pneumothorax. No adenopathy. There is old rib trauma on both sides, healed. Patient is status post coronary artery bypass grafting.  IMPRESSION: No edema or consolidation. No change in cardiac silhouette. Old rib trauma bilaterally. No pneumothorax.   Electronically Signed   By: Lowella Grip M.D.   On: 09/30/2014 11:13     Current Medications:  . aspirin EC  81 mg Oral QHS  . bisoprolol  10 mg Oral Daily  . clopidogrel  75 mg Oral Daily  . doxycycline  100 mg Oral Q12H  . etodolac  400 mg Oral Daily  . heparin  5,000 Units Subcutaneous 3 times per day  . insulin aspart  0-20 Units Subcutaneous TID WC  . insulin aspart protamine- aspart  12 Units Subcutaneous Q supper  . insulin aspart protamine- aspart  20 Units Subcutaneous Q breakfast  . isosorbide mononitrate  15 mg Oral Daily  . lisinopril  2.5 mg  Oral Daily  . metaxalone  800 mg Oral BID  . potassium chloride  20 mEq Oral Daily  . pravastatin  40 mg Oral QHS  . pregabalin  75 mg Oral Daily  . sodium chloride  3 mL Intravenous Q12H  . vitamin B-12  1,000 mcg Oral Daily      ASSESSMENT AND PLAN:  1. Shortness of Breath - Unclear cause, proBNP elevated to 1605 on 09/30/2014. Patient does not appear volume overloaded on physical exam. CXR negative. - Received IV Lasix 80mg  last night and did not experience any improvement in his SOB. - Planning to perform RHC/ possible CTO procedure today.  2. Left Elbow Wound - culture from last admission, +staph, on 10 day course of Doxycycline - consider Wound Care involvment  3. CAD  - s/p CABG x3 1990; occluded SVG to RCA and CTO of the native RCA on cardiac catheterization in June 2014. - Cath in September 2015 showed occlusion of the SVG to OM. - Serial troponin's have been negative. - Patient is scheduled for RHC prior to CTO attempt today which is to be performed by Dr. Irish Lack and Dr. Martinique.  - Per Dr. Haroldine Laws, if cardiac index 1.5 or less could possibly defer CTO and consider advanced HF therapies as first-line strategy.  4. Ischemic cardiomyopathy  - Cath on 08/01/2014 estimated EF was 25% - Last Echo was 04/25/2013. Echo already ordered and should be performed today. - Continue Lisinopril 2.5mg  daily. - Continue Bisoprolol 10mg  daily. - Continue Imdur 15mg  daily.  5. Bradycardia - Bisprolol changed from 15mg  to 10mg  yesterday. - Since dose adjustment, HR has been between 40-55's on telemetry. - Will decrease further to 5 mg daily, follow HR.  6. HLD - Continue Pravastatin 40mg  daily.  7. DM  - SSI - Novolog Mix 70/30 once daily.  Signed,  Patient seen and examined with PA-S Dineen Kid. Changes made where appropriate.  Rosaria Ferries , PA-C 7:57 AM 10/01/2014

## 2014-10-01 NOTE — Progress Notes (Signed)
Pt attempting to get out of bed. Both groin site assessed and left groin site bleeding, dressing was saturated. Pt was repositioned in the bed, pressure held for 15 minutes, and pressure dressing applied.

## 2014-10-01 NOTE — H&P (View-Only) (Signed)
Patient Name: Victor [name] Date of Encounter: 10/01/2014  Active Problems:   SOB (shortness of breath)   Primary Cardiologist: Dr. Martinique  Patient Profile: Victor [name] is a 78 year old Caucasian male with PMH of HLD, DM, ICM and CAD s/p CABG (x3, 1990) who was recently discharged on 09/26/2014 after aggressive diuresis and presented back to the ED on 09/30/2014 with increasing SOB.  SUBJECTIVE: Experienced episode of PND last night from 3:00 - 4:00 and says it gradually improved and he was able to go back to sleep. Says the Lasix yesterday evening did not help with his SOB. Denies any chest pain or palpitations.  OBJECTIVE Filed Vitals:   09/30/14 1730 09/30/14 1809 09/30/14 2056 10/01/14 0225  BP: 149/65 133/51 135/41 127/33  Pulse: 88 61 77 51  Temp:  97.8 F (36.6 C) 97.7 F (36.5 C) 98.4 F (36.9 C)  TempSrc:  Oral Oral Oral  Resp: 23 20 20 20   Height:  5\' 8"  (1.727 m)    Weight:  176 lb 12.8 oz (80.196 kg)  176 lb 12.9 oz (80.2 kg)  SpO2: 97% 95% 95% 98%    Intake/Output Summary (Last 24 hours) at 10/01/14 0757 Last data filed at 10/01/14 0225  Gross per 24 hour  Intake    240 ml  Output   1595 ml  Net  -1355 ml   Filed Weights   09/30/14 1809 10/01/14 0225  Weight: 176 lb 12.8 oz (80.196 kg) 176 lb 12.9 oz (80.2 kg)    PHYSICAL EXAM General: Well developed, well nourished, male in no acute distress. Head: Normocephalic, atraumatic.  Neck: Supple without bruits, JVD elevated ~ 7cm. Lungs:  Resp regular and unlabored, CTA without wheezing or rales. Heart: RRR, S1, S2, no S3, S4, or murmur; no rub. Abdomen: Soft, non-tender, non-distended, BS + x 4.  Extremities: No clubbing, no cyanosis, no edema. Distal pulses 2+ at lower extremities. Neuro: Alert and oriented X 3. Moves all extremities spontaneously. Psych: Normal affect.  LABS: CBC: Recent Labs  09/30/14 1055  WBC 9.5  NEUTROABS 5.0  HGB 12.8*  HCT 37.1*  MCV 86.3  PLT 606   Basic  Metabolic Panel: Recent Labs  09/30/14 1055 10/01/14 0430  NA 140 140  K 4.5 4.3  CL 97 99  CO2 27 26  GLUCOSE 118* 164*  BUN 31* 35*  CREATININE 0.96 1.07  CALCIUM 9.6 9.4   Liver Function Tests: Recent Labs  09/30/14 1055  AST 24  ALT 26  ALKPHOS 87  BILITOT 0.5  PROT 6.8  ALBUMIN 3.7   Cardiac Enzymes: Recent Labs  09/30/14 1835 09/30/14 2300 10/01/14 0430  TROPONINI <0.30 <0.30 <0.30   BNP: PRO B NATRIURETIC PEPTIDE (BNP)  Date/Time Value Ref Range Status  09/30/2014 10:55 AM 1605.0* 0 - 450 pg/mL Final  09/25/2014 05:05 AM 1195.0* 0 - 450 pg/mL Final   TELE: Sinus bradycardia with rate staying around 40 -55. No ventricular arrhythmias.  ECG: 09/30/2014 - Sinus bradycardia with 1st degree heart block and non-specific T-wave changes.  Radiology/Studies: Dg Chest 2 View - 09/30/2014   CLINICAL DATA:  One week history of difficulty breathing  EXAM: CHEST  2 VIEW  COMPARISON:  September 23, 2014  FINDINGS: There is no edema or consolidation. Heart is borderline enlarged with pulmonary vascular within normal limits. Pacemaker lead positions are unchanged. No pneumothorax. No adenopathy. There is old rib trauma on both sides, healed. Patient is status post coronary artery bypass grafting.  IMPRESSION: No edema or consolidation. No change in cardiac silhouette. Old rib trauma bilaterally. No pneumothorax.   Electronically Signed   By: Lowella Grip M.D.   On: 09/30/2014 11:13     Current Medications:  . aspirin EC  81 mg Oral QHS  . bisoprolol  10 mg Oral Daily  . clopidogrel  75 mg Oral Daily  . doxycycline  100 mg Oral Q12H  . etodolac  400 mg Oral Daily  . heparin  5,000 Units Subcutaneous 3 times per day  . insulin aspart  0-20 Units Subcutaneous TID WC  . insulin aspart protamine- aspart  12 Units Subcutaneous Q supper  . insulin aspart protamine- aspart  20 Units Subcutaneous Q breakfast  . isosorbide mononitrate  15 mg Oral Daily  . lisinopril  2.5 mg  Oral Daily  . metaxalone  800 mg Oral BID  . potassium chloride  20 mEq Oral Daily  . pravastatin  40 mg Oral QHS  . pregabalin  75 mg Oral Daily  . sodium chloride  3 mL Intravenous Q12H  . vitamin B-12  1,000 mcg Oral Daily      ASSESSMENT AND PLAN:  1. Shortness of Breath - Unclear cause, proBNP elevated to 1605 on 09/30/2014. Patient does not appear volume overloaded on physical exam. CXR negative. - Received IV Lasix 80mg  last night and did not experience any improvement in his SOB. - Planning to perform RHC/ possible CTO procedure today.  2. Left Elbow Wound - culture from last admission, +staph, on 10 day course of Doxycycline - consider Wound Care involvment  3. CAD  - s/p CABG x3 1990; occluded SVG to RCA and CTO of the native RCA on cardiac catheterization in June 2014. - Cath in September 2015 showed occlusion of the SVG to OM. - Serial troponin's have been negative. - Patient is scheduled for RHC prior to CTO attempt today which is to be performed by Dr. Irish Lack and Dr. Martinique.  - Per Dr. Haroldine Laws, if cardiac index 1.5 or less could possibly defer CTO and consider advanced HF therapies as first-line strategy.  4. Ischemic cardiomyopathy  - Cath on 08/01/2014 estimated EF was 25% - Last Echo was 04/25/2013. Echo already ordered and should be performed today. - Continue Lisinopril 2.5mg  daily. - Continue Bisoprolol 10mg  daily. - Continue Imdur 15mg  daily.  5. Bradycardia - Bisprolol changed from 15mg  to 10mg  yesterday. - Since dose adjustment, HR has been between 40-55's on telemetry. - Will decrease further to 5 mg daily, follow HR.  6. HLD - Continue Pravastatin 40mg  daily.  7. DM  - SSI - Novolog Mix 70/30 once daily.  Signed,  Patient seen and examined with PA-S Dineen Kid. Changes made where appropriate.  Rosaria Ferries , PA-C 7:57 AM 10/01/2014

## 2014-10-01 NOTE — Consult Note (Addendum)
WOC wound consult note Reason for Consult: Consult requested for left elbow.  Pt states this started as a bump a few weeks ago, it evolved and began draining.  He was put on antibiotics last week and feels wound has improved at this time. Wound type: Full thickness Measurement:2X2X.1cm Wound bed: 100% red and dry Drainage (amount, consistency, odor) Small amt tan drainage, no odor Periwound: Intact skin surrounding Dressing procedure/placement/frequency: Neosporin to promote moist healing and foam dressing to protect from further injury.  Discussed plan of care with patient and family and they verbalize understanding and deny further questions. Please re-consult if further assistance is needed.  Thank-you,  Julien Girt MSN, [place], [place], [place], [place]

## 2014-10-01 NOTE — Interval H&P Note (Signed)
History and Physical Interval Note:  10/01/2014 2:02 PM  Victor [name]  has presented today for surgery, with the diagnosis of cad  The various methods of treatment have been discussed with the patient and family. After consideration of risks, benefits and other options for treatment, the patient has consented to  Procedure(s): PERCUTANEOUS CORONARY STENT INTERVENTION (PCI-S) (N/A) as a surgical intervention .  The patient's history has been reviewed, patient examined, no change in status, stable for surgery.  I have reviewed the patient's chart and labs.  Questions were answered to the patient's satisfaction.   Cath Lab Visit (complete for each Cath Lab visit)  Clinical Evaluation Leading to the Procedure:   ACS: No.  Non-ACS:    Anginal Classification: CCS IV  Anti-ischemic medical therapy: Maximal Therapy (2 or more classes of medications)  Non-Invasive Test Results: Intermediate-risk stress test findings: cardiac mortality 1-3%/year  Prior CABG: Previous CABG        [name] [facility] 10/01/2014 2:02 PM

## 2014-10-01 NOTE — CV Procedure (Signed)
CARDIAC CATH NOTE  Name: Victor [name] MRN: [id number] DOB: [birth year]  Procedure: Right heart catheterization, PCI with angioplasty of chronic total occlusion of the RCA  Indication: 78 yo WM with history of CAD s/p remote CABG with known occlusion of the LCx and RCA. He also has a history of ischemic cardiomyopathy with EF of 25% and CHF. He presents with refractory class 4 dyspnea despite optimal medical therapy. Right heart cath was performed to assess hemodynamics. The RCA was occluded with right to left and left to right collaterals and was suitable for CTO PCI.  Right heart cath: the right groin was prepped, draped, and anesthetized with 1% lidocaine. Using a modified Seldinger technique, a 7 Fr sheath was introduced into the right femoral vein. A Swan Ganz catheter was used for recording hemodynamics and obtaining measurements of oxygen saturations and cardiac output.   Findings: Pressures: RA: 7/6 mean 3 mm Hg RV: 37/7 mm Hg PA: 36/13 mean 22 mm Hg PCWP: 9/10 mean 8 mm Hg AO: 133/44 mean 75 mm Hg  Saturations: PA: 65% AO: 94%  Cardiac output 5.1 L/min Cardiac index: 2.63 L/min/meter squared  Finding that hemodynamics were satisfactory we decided to proceed with planned PCI of RCA CTO  Procedural Details: Both groins were prepped, draped, and anesthetized with 1% lidocaine. Using the modified Seldinger technique, an 8 Fr long sheath was introduced into the right femoral artery. Likewise a 6 Fr sheath was introduced into the left femoral artery. Weight-based heparin was given for anticoagulation. Once a therapeutic ACT was achieved, an 8 Fr LA 0.75 guide catheter was inserted via the right femoral access.  A 6 Fr left Judkins catheter was used to visualize the collaterals from the LCA. Angiography demonstrated a 90% stenosis in the proximal RCA. This was followed by a 99% stenosis in the mid RCA after the takeoff of a high marginal branch. There was total occlusion in the mid  RCA. Initially a Fielder XT coronary guidewire was used to cross the first 2 lesions and penetrated the proximal cap on the total occlusion.  We attempted to get better backup support with a Corsair and Turnpike twist catheters but neither catheter was able to cross the second lesion. We then used a 1.2 mm balloon to dilate the first 2 lesions. We were eventually able to cross the CTO with a Piliot 200 wire but the wire position was lost when trying to cross with a Tornus device. The Tornus device also could not cross the second lesion due to angulation and heavy calcification. The CTO was recrossed with a Fielder XT wire positioned in a large RV marginal branch. The lesions were then crossed with a Fine cross catheter and the Ramond Marrow was exchanged for a Mailman wire. Using this wire we performed progressive balloon dilitations of all three lesions with a 1.2 mm, 1.5 mm, and 2.0 mm balloons. There was still severe wasting of the second lesion and this was diltated with a 2.25 noncompliant balloon with good expansion. At this point we were unable to cross with either a larger balloon or a stent again due the second lesion. The RCA was patent with dissection extending from the proximal to mid vessel at the bifurcation of the distal RCA and the RV marginal branch. There was marked improvement in antegrade flow TIMI 3.  Given his extensive dissection he was not a candidate for rotational atherectomy at this time. We ended the procedure at this point with flow reestablished in  the native vessel.  The patient tolerated the procedure well. There were no immediate procedural complications. Femoral hemostasis was achieved with bilateral Angioseal devices. The venous sheath will be removed in 2 hours. The patient was transferred to the post catheterization recovery area for further monitoring.  Lesion Data: Vessel: RCA CTO Percent stenosis (pre): 100% TIMI-flow (pre):  0% PTCA only Percent stenosis (post): 70% with  extensive dissection. TIMI-flow (post): 3  Conclusions:  1. Normal right heart pressures. Low PCWP due to diuresis. 2. Successful CTO PCI of the native RCA. Restoration of TIMI 3 antegrade flow.  Recommendations: Continue DAPT. Will monitor patient's symptomatic response. If he continues to have significant angina we may consider reassessing the RCA in 4-8 weeks. Once it has had a chance to heal he may be a candidate for rotational atherectomy and stenting. There is a reasonable chance that the vessel may heal with a satisfactory result.   Peter Martinique, [place]  10/01/2014, 7:00 PM

## 2014-10-01 NOTE — Progress Notes (Signed)
Pt requested to not take meds in am on empty stomach. Says it makes him nauseous. Wished to take after return from cath. Pt still in cath lab.

## 2014-10-01 NOTE — Progress Notes (Signed)
\  Echocardiogram 2D Echocardiogram has been performed.  Joelene Millin 10/01/2014, 12:35 PM

## 2014-10-02 ENCOUNTER — Inpatient Hospital Stay (HOSPITAL_COMMUNITY): Payer: Medicare Other

## 2014-10-02 ENCOUNTER — Other Ambulatory Visit: Payer: Self-pay

## 2014-10-02 ENCOUNTER — Encounter (HOSPITAL_COMMUNITY): Payer: Self-pay | Admitting: Radiology

## 2014-10-02 DIAGNOSIS — R0602 Shortness of breath: Secondary | ICD-10-CM

## 2014-10-02 DIAGNOSIS — E118 Type 2 diabetes mellitus with unspecified complications: Secondary | ICD-10-CM

## 2014-10-02 DIAGNOSIS — I6340 Cerebral infarction due to embolism of unspecified cerebral artery: Secondary | ICD-10-CM

## 2014-10-02 DIAGNOSIS — G459 Transient cerebral ischemic attack, unspecified: Secondary | ICD-10-CM

## 2014-10-02 DIAGNOSIS — I10 Essential (primary) hypertension: Secondary | ICD-10-CM

## 2014-10-02 DIAGNOSIS — I5042 Chronic combined systolic (congestive) and diastolic (congestive) heart failure: Secondary | ICD-10-CM

## 2014-10-02 LAB — BASIC METABOLIC PANEL
Anion gap: 13 (ref 5–15)
BUN: 27 mg/dL — AB (ref 6–23)
CALCIUM: 9.1 mg/dL (ref 8.4–10.5)
CHLORIDE: 100 meq/L (ref 96–112)
CO2: 26 mEq/L (ref 19–32)
Creatinine, Ser: 0.99 mg/dL (ref 0.50–1.35)
GFR, EST AFRICAN AMERICAN: 90 mL/min — AB (ref >90)
GFR, EST NON AFRICAN AMERICAN: 78 mL/min — AB (ref >90)
Glucose, Bld: 202 mg/dL — ABNORMAL HIGH (ref 70–99)
Potassium: 4.1 mEq/L (ref 3.7–5.3)
Sodium: 139 mEq/L (ref 137–147)

## 2014-10-02 LAB — CBC
HCT: 35 % — ABNORMAL LOW (ref 39.0–52.0)
Hemoglobin: 11.9 g/dL — ABNORMAL LOW (ref 13.0–17.0)
MCH: 29.5 pg (ref 26.0–34.0)
MCHC: 34 g/dL (ref 30.0–36.0)
MCV: 86.8 fL (ref 78.0–100.0)
PLATELETS: 168 10*3/uL (ref 150–400)
RBC: 4.03 MIL/uL — AB (ref 4.22–5.81)
RDW: 15.1 % (ref 11.5–15.5)
WBC: 9.5 10*3/uL (ref 4.0–10.5)

## 2014-10-02 LAB — GLUCOSE, CAPILLARY
GLUCOSE-CAPILLARY: 163 mg/dL — AB (ref 70–99)
GLUCOSE-CAPILLARY: 109 mg/dL — AB (ref 70–99)
Glucose-Capillary: 141 mg/dL — ABNORMAL HIGH (ref 70–99)
Glucose-Capillary: 114 mg/dL — ABNORMAL HIGH (ref 70–99)
Glucose-Capillary: 152 mg/dL — ABNORMAL HIGH (ref 70–99)

## 2014-10-02 MED ORDER — LISINOPRIL 2.5 MG PO TABS
2.5000 mg | ORAL_TABLET | Freq: Every day | ORAL | Status: DC
  Filled 2014-10-02: qty 1

## 2014-10-02 MED ORDER — ISOSORBIDE MONONITRATE 15 MG HALF TABLET
15.0000 mg | ORAL_TABLET | Freq: Once | ORAL | Status: AC
  Administered 2014-10-02: 15 mg via ORAL
  Filled 2014-10-02: qty 1

## 2014-10-02 MED ORDER — LISINOPRIL 5 MG PO TABS: 5.0000 mg | ORAL_TABLET | Freq: Every day | ORAL | Status: DC

## 2014-10-02 MED ORDER — LORAZEPAM 2 MG/ML IJ SOLN
2.0000 mg | Freq: Once | INTRAMUSCULAR | Status: AC
  Administered 2014-10-02: 2 mg via INTRAVENOUS
  Filled 2014-10-02: qty 1

## 2014-10-02 MED ORDER — BISOPROLOL FUMARATE 5 MG PO TABS
5.0000 mg | ORAL_TABLET | Freq: Every day | ORAL | Status: DC
  Filled 2014-10-02 (×2): qty 1

## 2014-10-02 MED ORDER — LISINOPRIL 2.5 MG PO TABS
2.5000 mg | ORAL_TABLET | Freq: Once | ORAL | Status: AC
  Administered 2014-10-02: 2.5 mg via ORAL
  Filled 2014-10-02: qty 1

## 2014-10-02 MED ORDER — DOPAMINE-DEXTROSE 3.2-5 MG/ML-% IV SOLN
INTRAVENOUS | Status: AC
  Filled 2014-10-02: qty 250

## 2014-10-02 MED ORDER — SODIUM CHLORIDE 0.9 % IV SOLN
INTRAVENOUS | Status: DC
  Administered 2014-10-02 – 2014-10-03 (×2): via INTRAVENOUS

## 2014-10-02 MED ORDER — FUROSEMIDE 40 MG PO TABS
60.0000 mg | ORAL_TABLET | Freq: Two times a day (BID) | ORAL | Status: DC
  Administered 2014-10-03: 60 mg via ORAL
  Filled 2014-10-02 (×4): qty 1

## 2014-10-02 MED ORDER — IOHEXOL 350 MG/ML SOLN
50.0000 mL | Freq: Once | INTRAVENOUS | Status: AC | PRN
  Administered 2014-10-02: 50 mL via INTRAVENOUS

## 2014-10-02 MED ORDER — DOPAMINE-DEXTROSE 3.2-5 MG/ML-% IV SOLN
0.0000 ug/kg/min | INTRAVENOUS | Status: DC
  Administered 2014-10-02: 5 ug/kg/min via INTRAVENOUS

## 2014-10-02 MED ORDER — ISOSORBIDE MONONITRATE ER 30 MG PO TB24
30.0000 mg | ORAL_TABLET | Freq: Every day | ORAL | Status: DC
  Filled 2014-10-02: qty 1

## 2014-10-02 NOTE — Progress Notes (Signed)
CARDIAC REHAB PHASE I   PRE:  Rate/Rhythm: 60 SR  BP:  Supine: 139/50  Sitting:   Standing:    SaO2: 97%RA  MODE:  Ambulation: 350 ft   POST:  Rate/Rhythm: 63 SR  BP:  Supine:   Sitting: 158/62  Standing:    SaO2: 100%RA 1105-1133 Pt with flat affect but answers questions appropriately. Walked 350 ft with gait belt use and asst x 1 with steady gait. Denied CP. Stated he had wife so will wait for family present to educate. Will follow up tomorrow. To recliner with chair alarm. RN aware pt in chair. Gave pt diet sprite at his request but quiet otherwise.   Graylon Good, RN BSN  10/02/2014 11:29 AM

## 2014-10-02 NOTE — Progress Notes (Signed)
Spoke with Dr. Aram Beecham (neurology) about pt receiving his PO medications. Pt is NPO post code stroke. MD stated to do a bedside swallow evaluation to assess his ability to swallow pills. He also stated that if he did well that he could have his medications by mouth but to keep him NPO after that so that they could get another swallow evaluation in the morning.

## 2014-10-02 NOTE — Progress Notes (Signed)
MD called and orders received for transfer to ICU status. Patient currently on dopamine gtt which requires titration and pt to move to ICU for monitoring.

## 2014-10-02 NOTE — Consult Note (Signed)
Referring Physician: Bensimhon    Chief Complaint: Code Stroke  HPI:                                                                                                                                         Victor [name] is an 78 y.o. male who underwent a right heart catheterization on 10/01/2014 due to increased SOB.  This AM patient was noted to be anxious.  Later in the day he was noted to be slower mentation than usual and then noted to have have left sided weakness and left facial droop. Code stroke was called.  On arrival his BP was 76/25 and noted to be systolic in the 40'H just before. Initially he was noted to to have dysarthria and left arm drift. Patient was given fluids and then Dopamine and BP increased to 96/25 with some improvement of left arm strength. After CT his BP was again taken and noted to be 118/31 and left arm continued to improve.   Date last known well: Date: 10/02/2014 Time last known well: Time: 13:15 tPA Given: No: improving symptoms NIHSS 3  Past Medical History  Diagnosis Date  . Arthropathy, unspecified, site unspecified   . Occlusion and stenosis of carotid artery without mention of cerebral infarction   . Raynaud's syndrome   . Congestive heart failure, unspecified   . Paroxysmal ventricular tachycardia   . Other specified forms of chronic ischemic heart disease   . Peripheral vascular disease   . Cardiomyopathy, ischemic   . Automatic implantable cardioverter-defibrillator in situ   . High cholesterol   . Shortness of breath     "both exertion and lying down" (04/24/2013)  . GERD (gastroesophageal reflux disease)   . Chronic lower back pain   . Coronary artery disease   . Hypertension   . Myocardial infarction 1990    "4-5 while in [facility] til they put in ICD"   . Type II diabetes mellitus   . Osteoarthrosis, unspecified whether generalized or localized, unspecified site   . Degenerative joint disease   . Arthritis     "back and hands"  (09/30/2014)  . DDD (degenerative disc disease), cervical   . DDD (degenerative disc disease), lumbosacral   . DDD (degenerative disc disease), thoracolumbar     Past Surgical History  Procedure Laterality Date  . Anal fistulectomy  2000's  . Cataract extraction, bilateral Bilateral ~ 2009  . Cardiac defibrillator placement  02/1989-12/2011    "I've had a total of 6 or 7 defibrillators put in" (09/30/2014)  . Finger fracture surgery Left ~ 2000    "pointer"  . Coronary artery bypass graft  01/1989    "CABG X3"   . Fracture surgery    . Coronary angioplasty with stent placement  04/2013    "1"  . Cardiac catheterization  07/2014    Family History  Problem Relation Age  of Onset  . Heart failure Mother 20  . Heart attack Father   . Leukemia Sister   . Stomach cancer Maternal Grandfather    Social History:  reports that he has quit smoking. His smoking use included Pipe. He has never used smokeless tobacco. He reports that he drinks alcohol. He reports that he does not use illicit drugs.  Allergies:  Allergies  Allergen Reactions  . Coreg [Carvedilol] Other (See Comments)    Shortness of breath ,fatigue ,dizzyness   . Lovastatin Other (See Comments)    CK elevation, Myalgias    Medications:                                                                                                                           Scheduled: . aspirin  81 mg Oral Daily  . bisoprolol  5 mg Oral Daily  . clopidogrel  75 mg Oral Q breakfast  . DOPamine      . doxycycline  100 mg Oral Q12H  . etodolac  400 mg Oral Daily  . furosemide  60 mg Oral BID  . heparin  5,000 Units Subcutaneous 3 times per day  . insulin aspart  0-20 Units Subcutaneous TID WC  . insulin aspart protamine- aspart  12 Units Subcutaneous Q supper  . insulin aspart protamine- aspart  20 Units Subcutaneous Q breakfast  . [START ON 10/03/2014] isosorbide mononitrate  30 mg Oral Daily  . [START ON 10/03/2014] lisinopril  5 mg  Oral Daily  . metaxalone  800 mg Oral BID  . potassium chloride  20 mEq Oral Daily  . pravastatin  40 mg Oral QHS  . pregabalin  75 mg Oral Daily  . sodium chloride  3 mL Intravenous Q12H  . TRIPLE ANTIBIOTIC   Topical Daily  . vitamin B-12  1,000 mcg Oral Daily    ROS:                                                                                                                                       History obtained from the patient  General ROS: negative for - chills, fatigue, fever, night sweats, weight gain or weight loss Psychological ROS: negative for - behavioral disorder, hallucinations, memory difficulties, mood swings or suicidal ideation Ophthalmic ROS: negative for - blurry vision, double vision, eye pain or loss of vision ENT ROS: negative  for - epistaxis, nasal discharge, oral lesions, sore throat, tinnitus or vertigo Allergy and Immunology ROS: negative for - hives or itchy/watery eyes Hematological and Lymphatic ROS: negative for - bleeding problems, bruising or swollen lymph nodes Endocrine ROS: negative for - galactorrhea, hair pattern changes, polydipsia/polyuria or temperature intolerance Respiratory ROS: negative for - cough, hemoptysis, shortness of breath or wheezing Cardiovascular ROS: negative for - chest pain, dyspnea on exertion, edema or irregular heartbeat Gastrointestinal ROS: negative for - abdominal pain, diarrhea, hematemesis, nausea/vomiting or stool incontinence Genito-Urinary ROS: negative for - dysuria, hematuria, incontinence or urinary frequency/urgency Musculoskeletal ROS: negative for - joint swelling or muscular weakness Neurological ROS: as noted in HPI Dermatological ROS: negative for rash and skin lesion changes  Neurologic Examination:                                                                                                      Blood pressure 158/62, pulse 53, temperature 97.6 F (36.4 C), temperature source Oral, resp. rate 19,  height 5\' 8"  (1.727 m), weight 80.1 kg (176 lb 9.4 oz), SpO2 94 %.   General: NAD Mental Status: Alert, oriented, thought content appropriate but family.  Speech slurred speech without evidence of aphasia.  Able to follow 3 step commands without difficulty. Cranial Nerves: II: Discs flat bilaterally; Visual fields grossly normal, pupils equal, round, reactive to light and accommodation III,IV, VI: ptosis not present, extra-ocular motions intact bilaterally V,VII: smile symmetric, facial light touch sensation normal bilaterally VIII: hearing normal bilaterally IX,X: gag reflex present XI: bilateral shoulder shrug XII: midline tongue extension without atrophy or fasciculations  Motor: Right : Upper extremity   5/5    Left:     Upper extremity   4/5 improving  Lower extremity   5/5     Lower extremity   5/5 Tone and bulk:normal tone throughout; no atrophy noted Sensory: Pinprick and light touch intact throughout, bilaterally Deep Tendon Reflexes:  Right: Upper Extremity   Left: Upper extremity   biceps (C-5 to C-6) 2/4   biceps (C-5 to C-6) 2/4 tricep (C7) 2/4    triceps (C7) 2/4 Brachioradialis (C6) 2/4  Brachioradialis (C6) 2/4  Lower Extremity Lower Extremity  quadriceps (L-2 to L-4) 1/4   quadriceps (L-2 to L-4) 1/4 Achilles (S1) 0/4   Achilles (S1) 0/4  Plantars: Right: downgoing   Left: downgoing Cerebellar: normal finger-to-nose,  normal heel-to-shin test Gait: not tested CV: pulses palpable throughout    Lab Results: Basic Metabolic Panel:  Recent Labs Lab 09/26/14 0315 09/30/14 1055 10/01/14 0430 10/01/14 1945 10/02/14 0318  NA 140 140 140  --  139  K 4.8 4.5 4.3  --  4.1  CL 99 97 99  --  100  CO2 26 27 26   --  26  GLUCOSE 245* 118* 164*  --  202*  BUN 43* 31* 35*  --  27*  CREATININE 1.05 0.96 1.07 1.00 0.99  CALCIUM 8.7 9.6 9.4  --  9.1    Liver Function Tests:  Recent Labs Lab 09/30/14 1055  AST 24  ALT  26  ALKPHOS 87  BILITOT 0.5  PROT  6.8  ALBUMIN 3.7   No results for input(s): LIPASE, AMYLASE in the last 168 hours. No results for input(s): AMMONIA in the last 168 hours.  CBC:  Recent Labs Lab 09/30/14 1055 10/01/14 1945 10/02/14 0318  WBC 9.5 9.7 9.5  NEUTROABS 5.0  --   --   HGB 12.8* 13.2 11.9*  HCT 37.1* 38.9* 35.0*  MCV 86.3 87.2 86.8  PLT 190 174 168    Cardiac Enzymes:  Recent Labs Lab 09/30/14 1055 09/30/14 1835 09/30/14 2300 10/01/14 0430  TROPONINI <0.30 <0.30 <0.30 <0.30    Lipid Panel: No results for input(s): CHOL, TRIG, HDL, CHOLHDL, VLDL, LDLCALC in the last 168 hours.  CBG:  Recent Labs Lab 10/01/14 0551 10/01/14 1142 10/01/14 2349 10/02/14 0758 10/02/14 1151  GLUCAP 186* 175* 215* 163* 141*    Microbiology: Results for orders placed or performed during the hospital encounter of 09/30/14  MRSA PCR Screening     Status: None   Collection Time: 10/01/14 10:06 PM  Result Value Ref Range Status   MRSA by PCR NEGATIVE NEGATIVE Final    Comment:        The GeneXpert MRSA Assay (FDA approved for NASAL specimens only), is one component of a comprehensive MRSA colonization surveillance program. It is not intended to diagnose MRSA infection nor to guide or monitor treatment for MRSA infections.     Coagulation Studies: No results for input(s): LABPROT, INR in the last 72 hours.  Imaging: No results found.   Etta Quill PA-C [facility] [phone]  10/02/2014, 2:16 PM   Patient seen and examined.  Clinical course and management discussed.  Necessary edits performed.  I agree with the above.  Assessment and plan of care developed and discussed below.   Assessment: 78 y.o. male s/p angioplasty procedure who today has been lethargic/confused and has now developed LUE weakness.  Patient hypotensive.  Head CT reviewed and shows no acute changes.  NIHSS of 3.  As BP improved neurological examination improved as well.  Suspect patient with small vessel  disease impacted by hypoperfusion.  Would not administer tPA at this time.  Patient on ASA and Plavix.     Echocardiogram from 11/11 shows inferolateral and anterolateral hypokinesis and EF of 35%  Stroke Risk Factors - diabetes mellitus, hyperlipidemia, hypertension and CAD  Recommendations: 1.  BP to be monitored with support given if necessary.  Patient currently on Dopamine. 2.  Carotid doppler 3.  Lipid panel, A1c  Case discussed with Dr. Cranston Neighbor, MD [facility] [phone]  10/02/2014  3:57 PM

## 2014-10-02 NOTE — Progress Notes (Signed)
Into room to assess patient.  Noted left sided weakness on assessment. Code stroke called.

## 2014-10-02 NOTE — Progress Notes (Addendum)
Rt venous sheath removed, pressure held for 20 minutes. Pt became very anxious and teary.  Stated he "could not hold still any longer and needed to get out of the bed" and c/o of intermittent chest pain, dull, non radiating. Dr. Debara Pickett paged, 2mg  of IV Ativan ordered and administered. EKG obtained and showed no acute changes. Wife also called to speak with pt. Pressure dressing applied, site a level 0. Pt started to drift off to sleep. Will monitor closely.

## 2014-10-02 NOTE — Progress Notes (Signed)
Report received from [facility]; pt transferred to 2H07 with no complaints of pain or discomfort; no neuro deficits noted currently; VS per flowsheet with dopamine gtt infusing per MD orders; spouse at bedside; questions encouraged and answered; will continue to monitor

## 2014-10-02 NOTE — Progress Notes (Addendum)
SUBJECTIVE:  Patient developed anxiety/confusion early this morning.  Attempted to get out of bed.  This caused some bleeding at his groin incisions.  He was given ativan and feels better.  He currently has no chest pain, is not SOB.  He has no other complaints.  OBJECTIVE:   Vitals:   Filed Vitals:   10/02/14 0400 10/02/14 0500 10/02/14 0745 10/02/14 1146  BP: 145/46  152/36 158/62  Pulse: 143  30 53  Temp: 97.3 F (36.3 C)  97.5 F (36.4 C) 97.6 F (36.4 C)  TempSrc: Oral  Oral Oral  Resp: 21  23 19   Height:      Weight:  80.1 kg (176 lb 9.4 oz)    SpO2: 100%  93% 94%   I&O's:   Intake/Output Summary (Last 24 hours) at 10/02/14 1202 Last data filed at 10/02/14 0800  Gross per 24 hour  Intake 1164.37 ml  Output   1100 ml  Net  64.37 ml   TELEMETRY: Reviewed telemetry pt in atrial bigemeny:     PHYSICAL EXAM General: Well developed, well nourished, in no acute distress Head:   Normal cephalic and atramatic  Lungs:  Clear bilaterally to auscultation. Heart:  RRR S1 S2  No JVD.   Abdomen: abdomen soft and non-tender, bilateral groin bandages c/d/i, appropriate tenderness, no hematoma Msk:  Back normal,  Normal strength and tone for age. Extremities:  Trace edema.   Neuro: Alert and oriented. Psych:  Normal affect, responds appropriately Skin: No rash   LABS: Basic Metabolic Panel:  Recent Labs  10/01/14 0430 10/01/14 1945 10/02/14 0318  NA 140  --  139  K 4.3  --  4.1  CL 99  --  100  CO2 26  --  26  GLUCOSE 164*  --  202*  BUN 35*  --  27*  CREATININE 1.07 1.00 0.99  CALCIUM 9.4  --  9.1   Liver Function Tests:  Recent Labs  09/30/14 1055  AST 24  ALT 26  ALKPHOS 87  BILITOT 0.5  PROT 6.8  ALBUMIN 3.7   No results for input(s): LIPASE, AMYLASE in the last 72 hours. CBC:  Recent Labs  09/30/14 1055 10/01/14 1945 10/02/14 0318  WBC 9.5 9.7 9.5  NEUTROABS 5.0  --   --   HGB 12.8* 13.2 11.9*  HCT 37.1* 38.9* 35.0*  MCV 86.3 87.2 86.8   PLT 190 174 168   Cardiac Enzymes:  Recent Labs  09/30/14 1835 09/30/14 2300 10/01/14 0430  TROPONINI <0.30 <0.30 <0.30   BNP: Invalid input(s): POCBNP D-Dimer: No results for input(s): DDIMER in the last 72 hours. Hemoglobin A1C: No results for input(s): HGBA1C in the last 72 hours. Fasting Lipid Panel: No results for input(s): CHOL, HDL, LDLCALC, TRIG, CHOLHDL, LDLDIRECT in the last 72 hours. Thyroid Function Tests: No results for input(s): TSH, T4TOTAL, T3FREE, THYROIDAB in the last 72 hours.  Invalid input(s): FREET3 Anemia Panel: No results for input(s): VITAMINB12, FOLATE, FERRITIN, TIBC, IRON, RETICCTPCT in the last 72 hours. Coag Panel:   Lab Results  Component Value Date   INR 1.13 09/23/2014   INR 1.31 08/01/2014   INR 1.23 04/25/2013    RADIOLOGY: Dg Chest 2 View  09/30/2014   CLINICAL DATA:  One week history of difficulty breathing  EXAM: CHEST  2 VIEW  COMPARISON:  September 23, 2014  FINDINGS: There is no edema or consolidation. Heart is borderline enlarged with pulmonary vascular within normal limits. Pacemaker lead positions  are unchanged. No pneumothorax. No adenopathy. There is old rib trauma on both sides, healed. Patient is status post coronary artery bypass grafting.  IMPRESSION: No edema or consolidation. No change in cardiac silhouette. Old rib trauma bilaterally. No pneumothorax.   Electronically Signed   By: Lowella Grip M.D.   On: 09/30/2014 11:13   Dg Chest 2 View  09/23/2014   CLINICAL DATA:  Shortness of breath.  EXAM: CHEST  2 VIEW  COMPARISON:  May 31, 2013.  FINDINGS: Stable cardiomediastinal silhouette. Both lungs are clear. Status post coronary artery bypass graft. No pneumothorax or pleural effusion is noted. Left-sided pacemaker is unchanged in position. The visualized skeletal structures are unremarkable.  IMPRESSION: No acute cardiopulmonary abnormality seen.   Electronically Signed   By: Sabino Dick M.D.   On: 09/23/2014 10:31       ASSESSMENT: Victor [name] is a 78 yo M with PMH of Systolic CHF, CAD s/p remote CABG who presented to [facility] yesterday for CTO of his RCA, this was successful and he is currently monitored in the Step-down unit.  Active Problems:   Type 2 diabetes mellitus   Chronic clinical systolic heart failure   Anemia-chronic\   CHF (congestive heart failure), NYHA class IV   SOB (shortness of breath)   CAD (coronary artery disease), native coronary artery   Essential hypertension     PLAN:   CAD/ Chronic Systolic CHF/ HTN: - Patient underwent successful CTO yesterday with Dr. Martinique and Dr. Scarlette Calico.  Will need to reassess RCA in 4-8 weeks if any significant angina, once he has had a chance to heal he may need rotational arthrectomy and stenting of his RCA. - Continue Lasix 60mg  BID - Increase Imdur to 30mg  daily - Increase Lisinopril to 5mg  daily (starting tomorrow) - Continue pravastatin - Continue Bisoprolol 5mg  - Given overnight confusion and bleeding from groin will monitor additional night in step-down. -Likely D/C home in AM.  T2DM -Mildly hyperglycemic off insulin yesterday - Novolog 70/30 20 units in AM, 12 units in PM - Hold Metformin  Chronic Anemia: Mild Hgb appears stable, likely secondary to anemia of chronic disease.  Only minimal bleeding from cath site. -Repeat CBC in AM.  Victor Groves, DO  10/02/2014  12:02 PM   ATTENDING ATTESTATION:  I have seen and examined the patient along with Dr. Heber Hunterdon on AM rounds. I have reviewed the chart, notes and new data.  I agree with his findings, examination & recommendations as noted above. Over night monitoring for marginally successful PTCA of RCA CTO yesterday.   Had O/n confusion - & agitation.   This Am, still a bit lethargic, but no angina or dyspnea.   Original plan was to increase ACE-I as his BPs were in 130mmHg range along with increasing Imdur to 30mg . Continuing statin, home lasix & BB for chronic  conmbined systolic & diastolic HF Class IV. No SSx of bleed - probably post-procedure.   After AM rounds - while monitoring him early this afternoon, he was noted by RN to have new onset L-sided weakness & confusion with HA, SBP dropped to 60s.  Code Stroke called.  Heading for STAT CT Head. Will recheck blood glucose.  IVF bolus given & started on IV Dopamine @ 5 mcg/min. BP now improved to ~90s.  L hand grip has improved, but still not holding L arm up.   Not sure why his BP would drop so precipitously -- ACE-I increase was only from 2.5 mg  to 5 mg --> will continue on Dopamine until BP recovers. Neurology consulted & present @ Code Stroke.  Will transfer to CCU following Head CT.   Critical care time ~30 min. With neuro check & managing hypotension.   Leonie Man, M.D., M.S.  [address] [place],   [address]  [phone]  10/02/2014 2:08 PM

## 2014-10-02 NOTE — Code Documentation (Signed)
78yo male who underwent complicated PCI in cath lab yesterday.  Patient developed confusion this morning around 0100 and got out of bed resulting in groin site bleeding.  Patient required Ativan IV and experienced lethargy today per bedside RN, Josh.  Patient with c/o headache today.  Patient assessed at 1315 by bedside RN and found to have no deficits and BP 158/62.  RN reassessed patient at 1345 and patient had left arm weakness and blurred vision, BP 83/32.  CBG 114. Code stroke called.  Stroke team to the bedside.  NIHSS 3, see documentation for details and code stroke times.  Patient with left arm weakness and dysarthria. BP now 69/22 at 1353.  Dr. Ellyn Hack at the bedside and ordered Dopamine IV.  Bedside RN started Dopamine and BP improved to a SBP in the 90s.  Patient taken to CT at that time.  Patient back to room after CT.  BP 107/81 at 1428 and left arm weakness improved to a drift.  Symptoms are mild and improving at this time, no acute stroke intervention per Dr. Doy Mince.  Patient remains in the window to treat with tPA should symptoms worsen until 1745.  Bedside handoff with bedside RN, Josh.  Repage Code Stroke if symptoms worsen.

## 2014-10-02 NOTE — Plan of Care (Signed)
Problem: ICU Phase Progression Outcomes Goal: O2 sats trending toward baseline Outcome: Completed/Met Date Met:  10/02/14 Goal: Dyspnea controlled at rest Outcome: Completed/Met Date Met:  10/02/14 Goal: Hemodynamically stable Outcome: Completed/Met Date Met:  10/02/14 Goal: Pain controlled with appropriate interventions Outcome: Completed/Met Date Met:  10/02/14 Goal: Voiding-avoid urinary catheter unless indicated Outcome: Completed/Met Date Met:  10/02/14  Problem: Phase I Progression Outcomes Goal: Pain controlled with appropriate interventions Outcome: Completed/Met Date Met:  10/02/14 Goal: Voiding-avoid urinary catheter unless indicated Outcome: Completed/Met Date Met:  10/02/14 Goal: Hemodynamically stable Outcome: Completed/Met Date Met:  10/02/14 Goal: Distal pulses equal to baseline Outcome: Completed/Met Date Met:  10/02/14 Goal: Vascular site scale level 0 - I Vascular Site Scale Level 0: No bruising/bleeding/hematoma Level I (Mild): Bruising/Ecchymosis, minimal bleeding/ooozing, palpable hematoma < 3 cm Level II (Moderate): Bleeding not affecting hemodynamic parameters, pseudoaneurysm, palpable hematoma > 3 cm Level III (Severe) Bleeding which affects hemodynamic parameters or retroperitoneal hemorrhage  Outcome: Completed/Met Date Met:  10/02/14 Goal: Post Cath/PCI return to appropriate Path Outcome: Completed/Met Date Met:  10/02/14

## 2014-10-03 DIAGNOSIS — R531 Weakness: Secondary | ICD-10-CM

## 2014-10-03 DIAGNOSIS — I6523 Occlusion and stenosis of bilateral carotid arteries: Secondary | ICD-10-CM

## 2014-10-03 DIAGNOSIS — M6289 Other specified disorders of muscle: Secondary | ICD-10-CM

## 2014-10-03 DIAGNOSIS — I25118 Atherosclerotic heart disease of native coronary artery with other forms of angina pectoris: Secondary | ICD-10-CM

## 2014-10-03 DIAGNOSIS — G458 Other transient cerebral ischemic attacks and related syndromes: Secondary | ICD-10-CM

## 2014-10-03 LAB — BASIC METABOLIC PANEL
ANION GAP: 12 (ref 5–15)
BUN: 21 mg/dL (ref 6–23)
CO2: 25 meq/L (ref 19–32)
CREATININE: 1 mg/dL (ref 0.50–1.35)
Calcium: 9 mg/dL (ref 8.4–10.5)
Chloride: 105 mEq/L (ref 96–112)
GFR calc Af Amer: 82 mL/min — ABNORMAL LOW (ref >90)
GFR calc non Af Amer: 71 mL/min — ABNORMAL LOW (ref >90)
Glucose, Bld: 159 mg/dL — ABNORMAL HIGH (ref 70–99)
Potassium: 4.9 mEq/L (ref 3.7–5.3)
SODIUM: 142 meq/L (ref 137–147)

## 2014-10-03 LAB — CBC
HCT: 31.4 % — ABNORMAL LOW (ref 39.0–52.0)
HEMOGLOBIN: 10.7 g/dL — AB (ref 13.0–17.0)
MCH: 29.9 pg (ref 26.0–34.0)
MCHC: 34.1 g/dL (ref 30.0–36.0)
MCV: 87.7 fL (ref 78.0–100.0)
Platelets: 173 10*3/uL (ref 150–400)
RBC: 3.58 MIL/uL — ABNORMAL LOW (ref 4.22–5.81)
RDW: 14.7 % (ref 11.5–15.5)
WBC: 8.5 10*3/uL (ref 4.0–10.5)

## 2014-10-03 LAB — LIPID PANEL
Cholesterol: 90 mg/dL (ref 0–200)
HDL: 38 mg/dL — ABNORMAL LOW (ref >39)
LDL Cholesterol: 37 mg/dL (ref 0–99)
TRIGLYCERIDES: 73 mg/dL (ref <150)
Total CHOL/HDL Ratio: 2.4 RATIO
VLDL: 15 mg/dL (ref 0–40)

## 2014-10-03 LAB — GLUCOSE, CAPILLARY
GLUCOSE-CAPILLARY: 126 mg/dL — AB (ref 70–99)
GLUCOSE-CAPILLARY: 69 mg/dL — AB (ref 70–99)
Glucose-Capillary: 240 mg/dL — ABNORMAL HIGH (ref 70–99)
Glucose-Capillary: 253 mg/dL — ABNORMAL HIGH (ref 70–99)

## 2014-10-03 LAB — HEMOGLOBIN A1C
Hgb A1c MFr Bld: 8.7 % — ABNORMAL HIGH (ref <5.7)
MEAN PLASMA GLUCOSE: 203 mg/dL — AB (ref <117)

## 2014-10-03 MED ORDER — LISINOPRIL 2.5 MG PO TABS: 2.5000 mg | ORAL_TABLET | Freq: Every day | ORAL | Status: DC

## 2014-10-03 MED ORDER — LISINOPRIL 2.5 MG PO TABS
2.5000 mg | ORAL_TABLET | Freq: Every day | ORAL | Status: DC
  Filled 2014-10-03: qty 1

## 2014-10-03 MED ORDER — ISOSORBIDE MONONITRATE 15 MG HALF TABLET
15.0000 mg | ORAL_TABLET | Freq: Every day | ORAL | Status: DC
  Administered 2014-10-03 – 2014-10-05 (×3): 15 mg via ORAL
  Filled 2014-10-03 (×3): qty 1

## 2014-10-03 NOTE — Progress Notes (Signed)
SUBJECTIVE:  Patient with concerning symptoms of dysarthria, left hand weakness, and decreased visual acuity in setting of hypotension yesterday.  Today he reports he feels very well, better than he has felt in some time.  He reports no weakness, decreased vision or trouble speaking.  He denies any chest pain or pressure and was able to walk 350 feet with rehab without chest pain or pressure.  OBJECTIVE:   Vitals:   Filed Vitals:   10/03/14 0700 10/03/14 0730 10/03/14 0800 10/03/14 1138  BP: 116/51 160/60 125/37 114/45  Pulse: 66 90 156 32  Temp:  97.5 F (36.4 C)  97.3 F (36.3 C)  TempSrc:  Oral  Oral  Resp: 20 19 19 17   Height:      Weight:      SpO2: 100% 100% 91% 89%   I&O's:    Intake/Output Summary (Last 24 hours) at 10/03/14 1306 Last data filed at 10/03/14 1030  Gross per 24 hour  Intake 1236.33 ml  Output   1325 ml  Net -88.67 ml   TELEMETRY: Reviewed telemetry pt in atrial bigemeny:     PHYSICAL EXAM General: Well developed, well nourished, in no acute distress, appears much improved from yesterday Head:   Normal cephalic and atramatic  Lungs:  Clear bilaterally to auscultation. Heart:  RRR S1 S2  No JVD.   Abdomen: abdomen soft and non-tender Msk:  Back normal,  Normal strength and tone for age. Extremities:  Trace edema.   Neuro: Alert and oriented. CN2-12 intact, 5/5 muscle strength in all four extremitites, intact FTN and rapid alternating movements. Psych:  Normal affect, responds appropriately Skin: No rash   LABS: Basic Metabolic Panel:  Recent Labs  10/02/14 0318 10/03/14 0240  NA 139 142  K 4.1 4.9  CL 100 105  CO2 26 25  GLUCOSE 202* 159*  BUN 27* 21  CREATININE 0.99 1.00  CALCIUM 9.1 9.0   Liver Function Tests: No results for input(s): AST, ALT, ALKPHOS, BILITOT, PROT, ALBUMIN in the last 72 hours. No results for input(s): LIPASE, AMYLASE in the last 72 hours. CBC:  Recent Labs  10/02/14 0318 10/03/14 0240  WBC 9.5 8.5    HGB 11.9* 10.7*  HCT 35.0* 31.4*  MCV 86.8 87.7  PLT 168 173   Cardiac Enzymes:  Recent Labs  09/30/14 1835 09/30/14 2300 10/01/14 0430  TROPONINI <0.30 <0.30 <0.30   BNP: Invalid input(s): POCBNP D-Dimer: No results for input(s): DDIMER in the last 72 hours. Hemoglobin A1C: No results for input(s): HGBA1C in the last 72 hours. Fasting Lipid Panel:  Recent Labs  10/03/14 0240  CHOL 90  HDL 38*  LDLCALC 37  TRIG 73  CHOLHDL 2.4   Thyroid Function Tests: No results for input(s): TSH, T4TOTAL, T3FREE, THYROIDAB in the last 72 hours.  Invalid input(s): FREET3 Anemia Panel: No results for input(s): VITAMINB12, FOLATE, FERRITIN, TIBC, IRON, RETICCTPCT in the last 72 hours. Coag Panel:   Lab Results  Component Value Date   INR 1.13 09/23/2014   INR 1.31 08/01/2014   INR 1.23 04/25/2013    RADIOLOGY: Ct Angio Head W/cm &/or Wo Cm  10/03/2014   CLINICAL DATA:  Single episode of LEFT-sided weakness which is now resolved. Frontal headache.  EXAM: CT ANGIOGRAPHY HEAD AND NECK  TECHNIQUE: Multidetector CT imaging of the head and neck was performed using the standard protocol during bolus administration of intravenous contrast. Multiplanar CT image reconstructions and MIPs were obtained to evaluate the vascular anatomy. Carotid  stenosis measurements (when applicable) are obtained utilizing NASCET criteria, using the distal internal carotid diameter as the denominator.  CONTRAST:  64mL OMNIPAQUE IOHEXOL 350 MG/ML SOLN  COMPARISON:  CT of the head October 02, 2014  FINDINGS: CTA HEAD FINDINGS  Anterior circulation: Patent cervical, petrous, cavernous and supra clinoid internal carotid arteries with severe calcific atherosclerosis. Normal appearance of the anterior middle cerebral arteries with patent anterior communicating artery.  Posterior circulation: Severe calcific atherosclerosis of the LEFT V4 origin with suspected in high-grade stenosis. Approximately 50% narrowing of the  RIGHT V4 segment. Normal appearance of basilar artery main PA and branch vessels. Tiny bilateral posterior communicating arteries are present with normal appearance of posterior cerebral arteries.  No large vessel occlusion, aneurysm, suspicious luminal irregularity.  Review of the MIP images confirms the above findings.  CTA NECK FINDINGS  Moderate calcific atherosclerosis of the aortic arch, patent main branch vessels without hemodynamically significant stenosis at the origins, however within 3 mm origin of the LEFT subclavian artery there is approximately 50% narrowing by intimal thickening and calcific atherosclerosis. 2 mm eccentric intimal thickening calcific atherosclerosis along the Common carotid arteries without hemodynamically significant stenosis. Circumferential intimal thickening of the distal LEFT Common carotid artery. Severe calcific atherosclerosis of the carotid bulbs bilaterally. No hemodynamically significant stenosis by NASCET criteria. Normal appearance of the cervical internal carotid arteries.  Non visualized vertebral artery, likely occluded at the origin. Widely patent origin of the RIGHT vertebral artery which is normal throughout the course. Minimal reconstitution of the distal LEFT V2 segment likely via muscular branches.  Multiple absent teeth. Median sternotomy and cardiac pacemaker in situ. Severe C5-6 and C6-7 degenerative discs. Included view of the lung apices demonstrates pulmonary vascular congestion and heterogeneous lung attenuation which may reflect pulmonary edema/small airway disease.  Review of the MIP images confirms the above findings.  IMPRESSION: CTA NECK: Severe calcific atherosclerosis of the carotid bulbs without hemodynamically significant stenosis.  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  CTA HEAD: Severe atherosclerosis of the bilateral intradural vertebral  arteries with suspected hemodynamically significant stenosis.  Severe calcific atherosclerosis of the carotid siphons without hemodynamically significant stenosis.   Electronically Signed   By: Elon Alas   On: 10/03/2014 02:03   Dg Chest 2 View  09/30/2014   CLINICAL DATA:  One week history of difficulty breathing  EXAM: CHEST  2 VIEW  COMPARISON:  September 23, 2014  FINDINGS: There is no edema or consolidation. Heart is borderline enlarged with pulmonary vascular within normal limits. Pacemaker lead positions are unchanged. No pneumothorax. No adenopathy. There is old rib trauma on both sides, healed. Patient is status post coronary artery bypass grafting.  IMPRESSION: No edema or consolidation. No change in cardiac silhouette. Old rib trauma bilaterally. No pneumothorax.   Electronically Signed   By: Lowella Grip M.D.   On: 09/30/2014 11:13   Dg Chest 2 View  09/23/2014   CLINICAL DATA:  Shortness of breath.  EXAM: CHEST  2 VIEW  COMPARISON:  May 31, 2013.  FINDINGS: Stable cardiomediastinal silhouette. Both lungs are clear. Status post coronary artery bypass graft. No pneumothorax or pleural effusion is noted. Left-sided pacemaker is unchanged in position. The visualized skeletal structures are unremarkable.  IMPRESSION: No acute cardiopulmonary abnormality seen.   Electronically Signed   By: Sabino Dick M.D.   On: 09/23/2014 10:31   Ct Head Wo Contrast  10/02/2014  CLINICAL DATA:  Sudden onset of left arm weakness with mild dysarthria. Code stroke.  EXAM: CT HEAD WITHOUT CONTRAST  TECHNIQUE: Contiguous axial images were obtained from the base of the skull through the vertex without intravenous contrast.  COMPARISON:  CTs of the cervical spine 05/15/2008 and 10/11/2005.  FINDINGS: There is no evidence of acute intracranial hemorrhage, mass lesion, brain edema or extra-axial fluid collection. The ventricles and subarachnoid spaces are appropriately sized for age. There is no CT evidence  of acute cortical infarction. There is minimal periventricular white matter disease. Prominent intracranial vascular calcifications are noted.  The visualized paranasal sinuses, mastoid air cells and middle ears are clear. The calvarium is intact.  IMPRESSION: 1. No acute intracranial findings.  No CT evidence of acute stroke. 2. Intracranial vascular calcifications. 3. These results were called by telephone at the time of interpretation on 10/02/2014 at 2:37 pm to Dr. Alexis Goodell , who verbally acknowledged these results.   Electronically Signed   By: Camie Patience M.D.   On: 10/02/2014 14:37   Ct Angio Neck W/cm &/or Wo/cm  10/03/2014   CLINICAL DATA:  Single episode of LEFT-sided weakness which is now resolved. Frontal headache.  EXAM: CT ANGIOGRAPHY HEAD AND NECK  TECHNIQUE: Multidetector CT imaging of the head and neck was performed using the standard protocol during bolus administration of intravenous contrast. Multiplanar CT image reconstructions and MIPs were obtained to evaluate the vascular anatomy. Carotid stenosis measurements (when applicable) are obtained utilizing NASCET criteria, using the distal internal carotid diameter as the denominator.  CONTRAST:  3mL OMNIPAQUE IOHEXOL 350 MG/ML SOLN  COMPARISON:  CT of the head October 02, 2014  FINDINGS: CTA HEAD FINDINGS  Anterior circulation: Patent cervical, petrous, cavernous and supra clinoid internal carotid arteries with severe calcific atherosclerosis. Normal appearance of the anterior middle cerebral arteries with patent anterior communicating artery.  Posterior circulation: Severe calcific atherosclerosis of the LEFT V4 origin with suspected in high-grade stenosis. Approximately 50% narrowing of the RIGHT V4 segment. Normal appearance of basilar artery main PA and branch vessels. Tiny bilateral posterior communicating arteries are present with normal appearance of posterior cerebral arteries.  No large vessel occlusion, aneurysm, suspicious  luminal irregularity.  Review of the MIP images confirms the above findings.  CTA NECK FINDINGS  Moderate calcific atherosclerosis of the aortic arch, patent main branch vessels without hemodynamically significant stenosis at the origins, however within 3 mm origin of the LEFT subclavian artery there is approximately 50% narrowing by intimal thickening and calcific atherosclerosis. 2 mm eccentric intimal thickening calcific atherosclerosis along the Common carotid arteries without hemodynamically significant stenosis. Circumferential intimal thickening of the distal LEFT Common carotid artery. Severe calcific atherosclerosis of the carotid bulbs bilaterally. No hemodynamically significant stenosis by NASCET criteria. Normal appearance of the cervical internal carotid arteries.  Non visualized vertebral artery, likely occluded at the origin. Widely patent origin of the RIGHT vertebral artery which is normal throughout the course. Minimal reconstitution of the distal LEFT V2 segment likely via muscular branches.  Multiple absent teeth. Median sternotomy and cardiac pacemaker in situ. Severe C5-6 and C6-7 degenerative discs. Included view of the lung apices demonstrates pulmonary vascular congestion and heterogeneous lung attenuation which may reflect pulmonary edema/small airway disease.  Review of the MIP images confirms the above findings.  IMPRESSION: CTA NECK: Severe calcific atherosclerosis of the carotid bulbs without hemodynamically significant stenosis.  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  CTA HEAD: Severe atherosclerosis of the bilateral intradural vertebral arteries with suspected hemodynamically significant stenosis.  Severe calcific atherosclerosis of the carotid siphons without hemodynamically significant stenosis.   Electronically Signed   By: Elon Alas   On: 10/03/2014 02:03       ASSESSMENT: Victor [name] is a 78 yo M with PMH of Systolic CHF, CAD s/p remote CABG who presented to [facility] yesterday for CTO of his RCA, this was complicated by transient neurologic symptoms.  Active Problems:   Type 2 diabetes mellitus   Chronic clinical systolic heart failure   Anemia-chronic\   CHF (congestive heart failure), NYHA class IV   SOB (shortness of breath)   CAD (coronary artery disease), native coronary artery   Essential hypertension   TIA (transient ischemic attack)     PLAN:   CAD/ Chronic Systolic CHF/ HTN: - Patient underwent successful CTO yesterday with Dr. [place] and Dr. Scarlette Calico.  Will need to reassess RCA in 4-8 weeks if any significant angina, once he has had a chance to heal he may need rotational arthrectomy and stenting of his RCA. - hold home Lasix 60 mg twice a day for now as he seems to be mildly hypovolemic -- encouraged increased by mouth intake. -Imdur to 15mg  daily - Hold lisinopril due to his hypotension may need to be restarted at 2.5mg  at discharge or in outpatient follow-up - Continue pravastatin - Continue Bisoprolol 5mg  if blood pressure remains stable. - Transfer to Telemetry  T2DM -CBG mostly well controlled - Novolog 70/30 20 units in AM, 12 units in PM - Hold Metformin  Chronic Anemia: Mild Hgb appears stable, likely secondary to anemia of chronic disease.   TIA: In setting of hypotension.  He was found to have significant vertebral artery stenosis which may explain some of his symptoms but unsure if that would explain all (although may be explained by more generalized hypoperfusion due to low blood pressure).  Unclear as to why he had a 90 point drop in BP when all that was changed of his BP meds was increase of Imdur from 15mg  to 30 and increase lisinopril from 2.5mg  to 5. -- will return Imdur to baseline dose and stop ACE inhibitor. -  Will complete stroke/TIA w/u per Neurology -Patient on ASA and Plavix - Continue  Pravastatin 40mg   [name], DO  10/03/2014  1:06 PM    ATTENDING ATTESTATION I have seen and evaluated the patient this morning along with Dr. Heber [place] on morning rounds. Together we reviewed the chart and discussed all the available data. I then recheck the patient after rounds to ensure that he was doing well as a notice of blood pressure had come down just a bit.  Yesterday's events as discussed in my previous note - briefly became profoundly hypotensive after getting 30 mg of Imdur as opposed to his baseline 15 mg. His blood pressure dropped from 150 mmHg down to 60 mm systolic and he became acutely symptomatically from a neurologic standpoint with left-sided weakness and an inability to hold his left arm up. Is temporally started on dopamine with rapid improvement of symptoms. This morning upon awakening his blood pressure stable and he is fully back to normal. He tells me the best he has in several months.  His blood pressure is now stable off the dopamine, and using diluted in the hall without difficulty. After getting his home dose of 15 mg of Imdur, his blood pressure went back down into the  90s but he remained asymptomatic. Confirms that he likely is mildly dehydrated and is hyper responsive to nitrates. I don't think he'll be a tolerate any increased dose of nitrates. At this point I would hold his lisinopril and bisoprolol to allow for his blood pressure to normalize. These can likely be restarted as an outpatient.  We'll attempt to try to transfer him to the telemetry for today and anticipate potential discharge home soon. No residual signs or symptoms to suggest a stroke actually occurred. There was no carotid disease, but vertebral disease was noted. I don't think this has anything do with what would amount to be a right MCA distribution type stroke.  Continue aspirin and Plavix as an statin.    Leonie Man, M.D., M.S. Interventional Cardiologist   Pager #  [phone]

## 2014-10-03 NOTE — Care Management Note (Signed)
    Page 1 of 1   10/03/2014     9:41:40 AM CARE MANAGEMENT NOTE 10/03/2014  Patient:  Victor [name], Victor [name]   Account Number:  [id number]  Date Initiated:  10/03/2014  Documentation initiated by:  Elissa Hefty  Subjective/Objective Assessment:   adm w heart failure     Action/Plan:   lives w wife, pcp dr m hamrick   Anticipated DC Date:     Anticipated DC Plan:           Choice offered to / List presented to:             Status of service:   Medicare Important Message given?  YES (If response is "NO", the following Medicare IM given date fields will be blank) Date Medicare IM given:  10/03/2014 Medicare IM given by:  Elissa Hefty Date Additional Medicare IM given:   Additional Medicare IM given by:    Discharge Disposition:    Per UR Regulation:  Reviewed for med. necessity/level of care/duration of stay  If discussed at [place] of Stay Meetings, dates discussed:    Comments:  11/13 0940 debbie Mac Dowdell rn,bsn will moniter for dc needs. may need hhrn for chf follow up.

## 2014-10-03 NOTE — Progress Notes (Signed)
1654 report received from [place] 1800 Pt transferred from 2 H . Pt fully awake and oriented . Family came in with pt.

## 2014-10-03 NOTE — Progress Notes (Addendum)
CARDIAC REHAB PHASE I   PRE:  Rate/Rhythm: 62 SR  BP:  Supine:  Sitting: 109/33  Standing:    SaO2: 100 2L 95 RA  MODE:  Ambulation: 350 ft   POST:  Rate/Rhythm: 71 SR  BP:  Supine:   Sitting: 114/45  Standing:    SaO2: 95 RA 1045-1120 On arrival pt in recliner. Assisted X 1 to ambulate. Gait steady. He was able to walk 350 feet without c/o of cp  or SOB. VS stable Pt back to recliner after walk with call light in reach. He does c/o of feeling tired today. O2 left off after walk reported to RN.  Rodney Langton RN 10/03/2014 11:16 AM

## 2014-10-03 NOTE — Progress Notes (Signed)
Report called to [facility]; meds given per MD orders; pt with no neurological symptoms or changes noted; VS per flowsheet; family at bedside; quesitons encouraged and answered; will continue to monitor

## 2014-10-03 NOTE — Progress Notes (Signed)
Pt. With CBG of 69. Orange juice and snack given to pt. PT. Alert and stable. No s/s of distress or discomfort noted. Pt. Denies pain. RN will continue to monitor pt. For changes in condition. Roselinda Bahena, Katherine Roan

## 2014-10-03 NOTE — Progress Notes (Signed)
STROKE TEAM PROGRESS NOTE   HISTORY Victor [name] is an 78 y.o. male who underwent a right heart catheterization on 10/01/2014 due to increased SOB. This AM patient was noted to be anxious. Later in the day he was noted to be slower mentation than usual and then noted to have have left sided weakness and left facial droop. Code stroke was called. On arrival his BP was 76/25 and noted to be systolic in the 34'V just before. Initially he was noted to to have dysarthria and left arm drift. Patient was given fluids and then Dopamine and BP increased to 96/25 with some improvement of left arm strength. After CT his BP was again taken and noted to be 118/31 and left arm continued to improve.   Date last known well: Date: 10/02/2014 Time last known well: Time: 13:15 tPA Given: No: improving symptoms NIHSS 3  SUBJECTIVE (INTERVAL HISTORY) No family is at the bedside.  Overall he feels his condition is completely resolved. He was sitting in chair comfortably. Denies any HA, dizziness, weakness or numbness. CTA done last night showed no bilaterally proximal ICA stenosis but not high grade requires intervention. BP overnight improved and off dopamine this am.   OBJECTIVE Temp:  [97.3 F (36.3 C)-99 F (37.2 C)] 97.3 F (36.3 C) (11/13 1138) Pulse Rate:  [30-156] 60 (11/13 1300) Cardiac Rhythm:  [-] Heart block (11/13 0801) Resp:  [13-25] 17 (11/13 1300) BP: (83-160)/(24-79) 96/31 mmHg (11/13 1300) SpO2:  [89 %-100 %] 96 % (11/13 1300) Weight:  [185 lb 3 oz (84 kg)] 185 lb 3 oz (84 kg) (11/13 0422)   Recent Labs Lab 10/02/14 1331 10/02/14 1644 10/02/14 2209 10/03/14 0725 10/03/14 1136  GLUCAP 114* 109* 152* 126* 240*    Recent Labs Lab 09/30/14 1055 10/01/14 0430 10/01/14 1945 10/02/14 0318 10/03/14 0240  NA 140 140  --  139 142  K 4.5 4.3  --  4.1 4.9  CL 97 99  --  100 105  CO2 27 26  --  26 25  GLUCOSE 118* 164*  --  202* 159*  BUN 31* 35*  --  27* 21  CREATININE 0.96  1.07 1.00 0.99 1.00  CALCIUM 9.6 9.4  --  9.1 9.0    Recent Labs Lab 09/30/14 1055  AST 24  ALT 26  ALKPHOS 87  BILITOT 0.5  PROT 6.8  ALBUMIN 3.7    Recent Labs Lab 09/30/14 1055 10/01/14 1945 10/02/14 0318 10/03/14 0240  WBC 9.5 9.7 9.5 8.5  NEUTROABS 5.0  --   --   --   HGB 12.8* 13.2 11.9* 10.7*  HCT 37.1* 38.9* 35.0* 31.4*  MCV 86.3 87.2 86.8 87.7  PLT 190 174 168 173    Recent Labs Lab 09/30/14 1055 09/30/14 1835 09/30/14 2300 10/01/14 0430  TROPONINI <0.30 <0.30 <0.30 <0.30   No results for input(s): LABPROT, INR in the last 72 hours. No results for input(s): COLORURINE, LABSPEC, [place], GLUCOSEU, HGBUR, BILIRUBINUR, KETONESUR, PROTEINUR, UROBILINOGEN, NITRITE, LEUKOCYTESUR in the last 72 hours.  Invalid input(s): APPERANCEUR     Component Value Date/Time   CHOL 90 10/03/2014 0240   TRIG 73 10/03/2014 0240   HDL 38* 10/03/2014 0240   CHOLHDL 2.4 10/03/2014 0240   VLDL 15 10/03/2014 0240   LDLCALC 37 10/03/2014 0240   Lab Results  Component Value Date   HGBA1C 8.8* 09/23/2014   No results found for: LABOPIA, COCAINSCRNUR, LABBENZ, AMPHETMU, THCU, LABBARB  No results for input(s): ETH in  the last 168 hours.  Ct Angio Head and neck W/cm &/or Wo Cm  10/03/2014   IMPRESSION: CTA NECK: Severe calcific atherosclerosis of the carotid bulbs without hemodynamically significant stenosis.  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  CTA HEAD: Severe atherosclerosis of the bilateral intradural vertebral arteries with suspected hemodynamically significant stenosis.  Severe calcific atherosclerosis of the carotid siphons without hemodynamically significant stenosis.     Ct Head Wo Contrast  10/02/2014    IMPRESSION: 1. No acute intracranial findings.  No CT evidence of acute stroke. 2. Intracranial vascular calcifications.  2D echo - 10/01/14 - Technically  difficult study with poor acoustic windows. Definity contrast would have been helpful. Normal LV size with EF 35%. Inferolateral severe hypokinesis, anterolateral hypokinesis. Normal RV size with mildly decreased systolic function. Mild mitral regurgitation. Moderate pulmonary hypertension.  PHYSICAL EXAM  Temp:  [97.3 F (36.3 C)-99 F (37.2 C)] 97.3 F (36.3 C) (11/13 1138) Pulse Rate:  [30-156] 61 (11/13 1400) Resp:  [13-25] 17 (11/13 1400) BP: (83-160)/(24-79) 85/43 mmHg (11/13 1400) SpO2:  [89 %-100 %] 94 % (11/13 1400) Weight:  [185 lb 3 oz (84 kg)] 185 lb 3 oz (84 kg) (11/13 0422)  General - Well nourished, well developed, in no apparent distress.  Ophthalmologic - not able to see through.  Cardiovascular - Regular rate and rhythm with no murmur.  Mental Status -  Level of arousal and orientation to time, place, and person were intact. Language including expression, naming, repetition, comprehension was assessed and found intact.  Cranial Nerves II - XII - II - Visual field intact OU. III, IV, VI - Extraocular movements intact. V - Facial sensation intact bilaterally. VII - Facial movement intact bilaterally. VIII - Hearing & vestibular intact bilaterally. X - Palate elevates symmetrically. XI - Chin turning & shoulder shrug intact bilaterally. XII - Tongue protrusion intact.  Motor Strength - The patient's strength was normal in all extremities and pronator drift was absent.  Bulk was normal and fasciculations were absent.   Motor Tone - Muscle tone was assessed at the neck and appendages and was normal.  Reflexes - The patient's reflexes were 1+ in all extremities and he had no pathological reflexes.  Sensory - Light touch, temperature/pinprick were assessed and were normal.    Coordination - The patient had normal movements in the hands and feet with no ataxia or dysmetria.  Tremor was absent.  Gait and Station - deferred due to BP at low  side.   ASSESSMENT/PLAN [name] is a 78 y.o. male with history of CHF with pacemaker, CAD with MI, VT, PVD, HLD, HTN, DM was stroke alerted with left sided weakness in the setting of hypotension and bradycardia. He was given fluid resusitation and BP improved. As his BP improved, his symptoms gradually improved. And currently resolved. BP overnight controlled with dopamine and dopamine off this am. Neuro exam unremarkable now. CT negative and CTA head and neck show severe atherosclerotic changes but not requiring intervention. Avoid hypotension is the key to prevent this happening again.   TIA: left sided weakness in the setting of hypotension and bradycardia is likely due to cerebral hypoperfusion secondary to severe diffuse atherosclerosis of large vessels in the setting of low BP. Avoid hypotension is recommended.  Resultant no focal neurological deficit  CTA showed severe atherosclerosis at b/l proximal and distal ICAs, VAs, no indication for intervention.  2D  Echo  EF 35%  LDL 37 at the goal < 70  HgbA1c 8.8, not at the goal < 6.5  Heparin subq for VTE prophylaxis  Diet heart healthy/carb modified  aspirin 81 mg orally every day and clopidogrel 75 mg orally every day prior to admission, now on aspirin 81 mg orally every day and clopidogrel 75 mg orally every day. OK to continue dural antiplatelet  Patient counseled to be compliant with his antithrombotic medications  Ongoing aggressive risk factor management  Cardiomyopathy / CHF - EF 35% - off dopamine this am - BP overnight stable - avoid hypotension - on BB and ACEI and lasix - manage per primary team  Hyperlipidemia  Home meds:  pravastatin resumed in hospital  LDL 37, at the goal < 70  Continue statin at discharge  Diabetes  HgbA1c 8.8, not at goal < 6.5  Unontrolled  On insulin  SSI  DM education  Other Stroke Risk Factors  Advanced age  Coronary artery disease with MI    PVD  Hospital day # 3  Neurology will sign off. Please call with questions. Thanks for the consult.  Rosalin Hawking, MD PhD Stroke Neurology 10/03/2014 2:35 PM   To contact Stroke Continuity provider, please refer to [email or web address] After hours, contact General Neurology

## 2014-10-04 DIAGNOSIS — I5022 Chronic systolic (congestive) heart failure: Secondary | ICD-10-CM

## 2014-10-04 LAB — GLUCOSE, CAPILLARY
GLUCOSE-CAPILLARY: 89 mg/dL (ref 70–99)
GLUCOSE-CAPILLARY: 153 mg/dL — AB (ref 70–99)
Glucose-Capillary: 150 mg/dL — ABNORMAL HIGH (ref 70–99)
Glucose-Capillary: 167 mg/dL — ABNORMAL HIGH (ref 70–99)
Glucose-Capillary: 184 mg/dL — ABNORMAL HIGH (ref 70–99)

## 2014-10-04 NOTE — Plan of Care (Signed)
Problem: Phase I Progression Outcomes Goal: Progress activity as tolerated unless otherwise ordered Outcome: Progressing Up in chair.  Can ambulate with minimal exertion.

## 2014-10-04 NOTE — Progress Notes (Signed)
Occupational Therapy Evaluation and Discharge Patient Details Name: Victor [name] MRN: [id number] DOB: [birth year] Today's Date: 10/04/2014    History of Present Illness Pt is a 78 yo WM with history of CAD s/p remote CABG with known occlusion of the LCx and RCA. He also has a history of ischemic cardiomyopathy with EF of 25% and CHF. He presents with refractory class 4 dyspnea despite optimal medical therapy. Right heart cath was performed to assess hemodynamics. The RCA was occluded with right to left and left to right collaterals and was suitable for CTO PCI. The morning after procedure pt was noted to be anxious. Later in the day he was noted to be slower mentation than usual and then noted to have have left sided weakness and left facial droop. Code stroke was called. On arrival his BP was 76/25 and noted to be systolic in the 89'H just before. Initially he was noted to to have dysarthria and left arm drift. Patient was given fluids and then Dopamine and BP increased to 96/25 with some improvement of left arm strength. After CT his BP was again taken and noted to be 118/31 and left arm continued to improve.    Clinical Impression   PTA pt lived at home and was independent with use of SPC. [place]. Pt was irritated at beginning of OT session after speaking with Cardiac rehab regarding heart healthy lifestyle. OT provided education and therapeutic listening in order to engage pt in being an active participant in his health care. Educated pt and wife on fall prevention and energy conservation. No further acute OT needs.     Follow Up Recommendations  No OT follow up;Supervision - Intermittent    Equipment Recommendations  Tub/shower seat    Recommendations for Other Services       Precautions / Restrictions Precautions Precautions: Fall Restrictions Weight Bearing Restrictions: No      Mobility Bed Mobility Overal bed mobility: Modified Independent                 Transfers Overall transfer level: Needs assistance Equipment used: None Transfers: Sit to/from Stand;Stand Pivot Transfers Sit to Stand: Supervision Stand pivot transfers: Supervision       General transfer comment: Supervision for safety. Reinforced not getting up without assistance.          ADL Overall ADL's : Needs assistance/impaired                                       General ADL Comments: Pt overall at Supervision level for ADLs and functional mobility. Educated pt and wife on fall prevention and energy conservation technique. When OT arrived, RN was providing information about heart healthy lifestyle and pt expressed frustration and irritation with his care and "not being able to do the things I enjoy anymore." Discussed with pt and wife ways to incorporate heart healthy lifestyle including walking (outdoors, at the mall, etc) and through cooking at home with a variety of spices. Educated pt on relaxation techniques to promote heart health by decreasing stress and frustration. Encouraged pt to participate fully in cardiac rehab.       Vision  Pt reports no change from baseline.                    Perception Perception Perception Tested?: No   Praxis Praxis Praxis tested?: Within  functional limits    Pertinent Vitals/Pain Pain Assessment: No/denies pain     Hand Dominance Right   Extremity/Trunk Assessment Upper Extremity Assessment Upper Extremity Assessment: Overall WFL for tasks assessed   Lower Extremity Assessment Lower Extremity Assessment: Overall WFL for tasks assessed   Cervical / Trunk Assessment Cervical / Trunk Assessment: Normal   Communication Communication Communication: No difficulties   Cognition Arousal/Alertness: Awake/alert Behavior During Therapy: Flat affect (irritated) Overall Cognitive Status: Within Functional Limits for tasks assessed                                Home Living  Family/patient expects to be discharged to:: Private residence Living Arrangements: Spouse/significant other;Children Available Help at Discharge: Family;Available 24 hours/day Type of Home: House Home Access: Stairs to enter [employer] of Steps: 4   Home Layout: One level               Home Equipment: Cane - single point          Prior Functioning/Environment Level of Independence: Independent with assistive device(s)        Comments: Uses cane all the time.     OT Diagnosis: Other (comment) (cardiopulmonary status)                          End of Session  Activity Tolerance: Patient tolerated treatment well Patient left: in bed;with call bell/phone within reach;with bed alarm set;with family/visitor present   Time: 6789-3810 OT Time Calculation (min): 18 min Charges:  OT General Charges $OT Visit: 1 Procedure OT Evaluation $Initial OT Evaluation Tier I: 1 Procedure OT Treatments $Self Care/Home Management : 8-22 mins  Villa Herb M 10/04/2014, 4:18 PM   Cyndie Chime, OTR/L Occupational Therapist [phone] (pager)

## 2014-10-04 NOTE — Plan of Care (Signed)
Problem: Consults Goal: Skin Care Protocol Initiated - if Braden Score 18 or less If consults are not indicated, leave blank or document N/A  Outcome: Completed/Met Date Met:  10/04/14 Goal: Diabetes Guidelines if Diabetic/Glucose > 140 If diabetic or lab glucose is > 140 mg/dl - Initiate Diabetes/Hyperglycemia Guidelines & Document Interventions  Outcome: Completed/Met Date Met:  10/04/14

## 2014-10-04 NOTE — Progress Notes (Signed)
SUBJECTIVE: The patient is doing well today.  At this time, he denies chest pain, shortness of breath, or any new concerns.  Marland Kitchen aspirin  81 mg Oral Daily  . clopidogrel  75 mg Oral Q breakfast  . doxycycline  100 mg Oral Q12H  . etodolac  400 mg Oral Daily  . heparin  5,000 Units Subcutaneous 3 times per day  . insulin aspart  0-20 Units Subcutaneous TID WC  . insulin aspart protamine- aspart  12 Units Subcutaneous Q supper  . insulin aspart protamine- aspart  20 Units Subcutaneous Q breakfast  . isosorbide mononitrate  15 mg Oral Daily  . metaxalone  800 mg Oral BID  . potassium chloride  20 mEq Oral Daily  . pravastatin  40 mg Oral QHS  . pregabalin  75 mg Oral Daily  . sodium chloride  3 mL Intravenous Q12H  . TRIPLE ANTIBIOTIC   Topical Daily  . vitamin B-12  1,000 mcg Oral Daily   . sodium chloride      OBJECTIVE: Physical Exam: Filed Vitals:   10/03/14 1700 10/03/14 1755 10/03/14 2012 10/04/14 0447  BP: 89/37 116/36 111/44 146/50  Pulse: 54 61 63 73  Temp:  97.6 F (36.4 C) 97.3 F (36.3 C) 97.2 F (36.2 C)  TempSrc:  Oral Oral Oral  Resp: 15 18 18 18   Height:  5\' 8"  (1.727 m)    Weight:  181 lb 3.5 oz (82.2 kg)  181 lb 8 oz (82.328 kg)  SpO2: 93% 97% 98% 98%    Intake/Output Summary (Last 24 hours) at 10/04/14 1250 Last data filed at 10/04/14 0932  Gross per 24 hour  Intake   1770 ml  Output    950 ml  Net    820 ml    Telemetry reveals sinus rhythm  GEN- The patient is well appearing, alert and oriented x 3 today.   Head- normocephalic, atraumatic Eyes-  Sclera clear, conjunctiva pink Ears- hearing intact Oropharynx- clear Neck- supple, no JVP Lymph- no cervical lymphadenopathy Lungs- Clear to ausculation bilaterally, normal work of breathing Heart- Regular rate and rhythm, no murmurs, rubs or gallops, PMI not laterally displaced GI- soft, NT, ND, + BS Extremities- no clubbing, cyanosis, or edema   LABS: Basic Metabolic Panel:  Recent  Labs  10/02/14 0318 10/03/14 0240  NA 139 142  K 4.1 4.9  CL 100 105  CO2 26 25  GLUCOSE 202* 159*  BUN 27* 21  CREATININE 0.99 1.00  CALCIUM 9.1 9.0   Liver Function Tests: No results for input(s): AST, ALT, ALKPHOS, BILITOT, PROT, ALBUMIN in the last 72 hours. No results for input(s): LIPASE, AMYLASE in the last 72 hours. CBC:  Recent Labs  10/02/14 0318 10/03/14 0240  WBC 9.5 8.5  HGB 11.9* 10.7*  HCT 35.0* 31.4*  MCV 86.8 87.7  PLT 168 173   Cardiac Enzymes: No results for input(s): CKTOTAL, CKMB, CKMBINDEX, TROPONINI in the last 72 hours. BNP: Invalid input(s): POCBNP D-Dimer: No results for input(s): DDIMER in the last 72 hours. Hemoglobin A1C:  Recent Labs  10/03/14 0240  HGBA1C 8.7*   Fasting Lipid Panel:  Recent Labs  10/03/14 0240  CHOL 90  HDL 38*  LDLCALC 37  TRIG 73  CHOLHDL 2.4   ASSESSMENT AND PLAN:  Active Problems:   Type 2 diabetes mellitus   Chronic clinical systolic heart failure   Anemia-chronic\   CHF (congestive heart failure), NYHA class IV   SOB (shortness of breath)  CAD (coronary artery disease), native coronary artery   Essential hypertension   TIA (transient ischemic attack)   Left-sided weakness   Weakness   CAD/ Chronic Systolic CHF/ HTN:  Doing well s/p PCI by Dr Martinique.  He had transient neuro symptoms which have resolved. Per Dr Martinique, will need to reassess RCA in 4-8 weeks if any significant angina, once he has had a chance to heal he may need rotational arthrectomy and stenting of his RCA. Continue current medicines  TIA- resolved appreciate neuro input  Anemia- stable  Ischemic CM Stable No change required today  Observe another day Plant to discharge to home tomorrow

## 2014-10-04 NOTE — Evaluation (Signed)
Physical Therapy Evaluation Patient Details Name: Victor [name] MRN: [id number] DOB: Apr 13, 1936 Today's Date: 10/04/2014   History of Present Illness  Pt is a 78 yo WM with history of CAD s/p remote CABG with known occlusion of the LCx and RCA. He also has a history of ischemic cardiomyopathy with EF of 25% and CHF. He presents with refractory class 4 dyspnea despite optimal medical therapy. Right heart cath was performed to assess hemodynamics. The RCA was occluded with right to left and left to right collaterals and was suitable for CTO PCI. The morning after procedure pt was noted to be anxious. Later in the day he was noted to be slower mentation than usual and then noted to have have left sided weakness and left facial droop. Code stroke was called. On arrival his BP was 76/25 and noted to be systolic in the 93'Y just before. Initially he was noted to to have dysarthria and left arm drift. Patient was given fluids and then Dopamine and BP increased to 96/25 with some improvement of left arm strength. After CT his BP was again taken and noted to be 118/31 and left arm continued to improve.   Clinical Impression  Pt admitted with the above. Pt currently with functional limitations due to the deficits listed below (see PT Problem List). At the time of PT eval pt was able to perform transfers and ambulation without an AD and without physical assist. Pt states he is anxious to get back to [place] ranch to [place] to his horses, and that he is walking better now than he has in a long time. Pt will benefit from skilled PT to increase their independence and safety with mobility to allow discharge to the venue listed below.       Follow Up Recommendations No PT follow up    Equipment Recommendations  None recommended by PT    Recommendations for Other Services       Precautions / Restrictions Precautions Precautions: Fall Restrictions Weight Bearing Restrictions: No      Mobility  Bed  Mobility Overal bed mobility: Modified Independent             General bed mobility comments: No physical assist required.   Transfers Overall transfer level: Needs assistance Equipment used: None Transfers: Sit to/from Stand Sit to Stand: Supervision         General transfer comment: Pt able to power-up to full standing without assistance. No unsteadiness or LOB noted.   Ambulation/Gait Ambulation/Gait assistance: Supervision Ambulation Distance (Feet): 400 Feet Assistive device: None Gait Pattern/deviations: Step-through pattern;Decreased stride length;Trunk flexed Gait velocity: Decreased Gait velocity interpretation: Below normal speed for age/gender General Gait Details: Pt appeared slightly unsteady at times, requiring close supervision but no hands-on assist. Pt states he is ambulating better now than PTA. Pt reports no SOB or fatigue at end of gait training.   Stairs            Wheelchair Mobility    Modified Rankin (Stroke Patients Only)       Balance Overall balance assessment: Needs assistance Sitting-balance support: Feet supported;No upper extremity supported Sitting balance-Leahy Scale: Good     Standing balance support: No upper extremity supported;During functional activity Standing balance-Leahy Scale: Fair                               Pertinent Vitals/Pain Pain Assessment: No/denies pain    Home Living Family/patient expects  to be discharged to:: Private residence Living Arrangements: Spouse/significant other;Children Available Help at Discharge: Family;Available 24 hours/day Type of Home: House Home Access: Stairs to enter   [employer] of Steps: 4 Home Layout: One level Home Equipment: Cane - single point      Prior Function Level of Independence: Independent with assistive device(s)         Comments: Uses cane all the time.      Hand Dominance   Dominant Hand: Right    Extremity/Trunk  Assessment   Upper Extremity Assessment: Defer to OT evaluation           Lower Extremity Assessment: Overall WFL for tasks assessed      Cervical / Trunk Assessment: Normal  Communication   Communication: No difficulties  Cognition Arousal/Alertness: Lethargic Behavior During Therapy: Flat affect Overall Cognitive Status: Within Functional Limits for tasks assessed                      General Comments      Exercises        Assessment/Plan    PT Assessment Patient needs continued PT services  PT Diagnosis Abnormality of gait;Generalized weakness   PT Problem List Decreased strength;Decreased range of motion;Decreased activity tolerance;Decreased balance;Decreased mobility;Decreased knowledge of use of DME;Decreased safety awareness;Decreased knowledge of precautions  PT Treatment Interventions DME instruction;Gait training;Stair training;Functional mobility training;Therapeutic activities;Therapeutic exercise;Neuromuscular re-education;Patient/family education   PT Goals (Current goals can be found in the Care Plan section) Acute Rehab PT Goals Patient Stated Goal: Get back to his horses. States it has been 2 weeks since he has ridden.  PT Goal Formulation: With patient Time For Goal Achievement: 10/11/14 Potential to Achieve Goals: Good    Frequency Min 3X/week   Barriers to discharge        Co-evaluation               End of Session Equipment Utilized During Treatment: Gait belt Activity Tolerance: Patient tolerated treatment well Patient left: in chair;with chair alarm set;with call bell/phone within reach Nurse Communication: Mobility status         Time: 8127-5170 PT Time Calculation (min) (ACUTE ONLY): 20 min   Charges:   PT Evaluation $Initial PT Evaluation Tier I: 1 Procedure PT Treatments $Gait Training: 8-22 mins   PT G Codes:          Rolinda Roan 10/04/2014, 1:39 PM   Rolinda Roan, PT, DPT Acute Rehabilitation  Services Pager: [phone]

## 2014-10-04 NOTE — Progress Notes (Signed)
Patient sleeping when cardiac rehab ready to walk with patient. D/C tomorrow per MD note. Spoke with primary RN and she stated would be OK to wake patient to educate. Patient became agitated when discussing hgb A1C and heart failure diet. Patients wife walked into room and was very receptive to education. Patient walked with PT 1 1/2 hours ago and not willing to walk again. Patient wife requested that referral be sent to [facility] for phase 2 cardiac rehab.Order placed.

## 2014-10-04 NOTE — Plan of Care (Signed)
Problem: Phase I Progression Outcomes Goal: Pain controlled Outcome: Completed/Met Date Met:  10/04/14     

## 2014-10-05 ENCOUNTER — Encounter (HOSPITAL_COMMUNITY): Payer: Self-pay | Admitting: Physician Assistant

## 2014-10-05 DIAGNOSIS — E1159 Type 2 diabetes mellitus with other circulatory complications: Secondary | ICD-10-CM

## 2014-10-05 DIAGNOSIS — Z951 Presence of aortocoronary bypass graft: Secondary | ICD-10-CM | POA: Diagnosis present

## 2014-10-05 DIAGNOSIS — K219 Gastro-esophageal reflux disease without esophagitis: Secondary | ICD-10-CM | POA: Diagnosis present

## 2014-10-05 LAB — GLUCOSE, CAPILLARY
GLUCOSE-CAPILLARY: 139 mg/dL — AB (ref 70–99)
GLUCOSE-CAPILLARY: 161 mg/dL — AB (ref 70–99)

## 2014-10-05 MED ORDER — BISOPROLOL FUMARATE 5 MG PO TABS
5.0000 mg | ORAL_TABLET | Freq: Every day | ORAL | Status: DC
  Administered 2014-10-05: 5 mg via ORAL
  Filled 2014-10-05: qty 1

## 2014-10-05 MED ORDER — BISOPROLOL FUMARATE 5 MG PO TABS: 5.0000 mg | ORAL_TABLET | Freq: Every day | ORAL | Status: DC

## 2014-10-05 NOTE — Discharge Summary (Signed)
Discharge Summary   Patient ID: Victor [name] MRN: [id number], DOB/AGE: [birth year] 76 y.o. Admit date: 09/30/2014 D/C date:     10/05/2014  Primary Cardiologist: Dr. Lovena Le Dr. Martinique (thinks he is transitioning care to Dr. Martinique after CTO procedure)  Principal Problem:   Coronary artery disease Active Problems:   Type 2 diabetes mellitus   Cardiomyopathy, ischemic   Peripheral vascular disease   Automatic implantable cardioverter-defibrillator in situ   Chronic clinical systolic heart failure   Anemia-chronic\   Essential hypertension   TIA (transient ischemic attack)   GERD (gastroesophageal reflux disease)    Admission Dates: 09/30/14-10/05/14 Discharge Diagnosis: Chronic dyspnea s/p previously planned PCI of CTO in RCA  HPI: Victor [name] is a 78 y.o. male with a history of HLD, DM, ischemic cardiomyopathy (EF 25%), significant CAD s/p CABGx3 (1990) with previously planned PCI of CTO of RCA on 10/01/14 and recent hospitalization for CHF who presented to [facility] shortly after discharge on 09/30/14 with SOB.   Hospital Course  CAD - s/p CABG x3 1990; occluded SVG to RCA and CTO of the native RCA on cardiac catheterization in June 2014. Cath in September 2015 showed occlusion of the SVG to OM. -- Serial troponin negative. -- Doing well s/p PCI of CTO by Dr Martinique. Per Dr Martinique, will need to reassess RCA in 4-8 weeks if he has any significant angina, once he has had a chance to heal he may need rotational arthrectomy and stenting of his RCA. -- Continue DAPT with ASA/plavix, BB and statin. Continue Imdur 15mg  daily. -- Some bruising over femoral cath sites but non tender and soft.   TIA- he had transient neuro symptoms following his heart cath which have resolved.  Acute on chronic systolic CHF/ischemic CM- EF 35% on repeat ECHO this admission with inferolateral severe hypokinesis, anterolateral hypokinesis, normal RV size with mildly decreased systolic function, mild MR and  mod pulm HTN  -- Upon admission: proBNP elevated to 1605 on 09/30/2014. Patient does not appear volume overloaded on physical exam. CXR negative. -- Received IV diuresis with some improvement. -- S/p R/LHC on 10/01/14 with normal right heart pressures and low PCWP due to diuresis -- Continue Lisinopril 2.5mg  daily, bisoprolol 5mg  daily and Lasix 60mg  BID  Left Elbow Wound -- Culture from last admission, +staph, on 10 day course of Doxycycline  Bradycardia -- Bisprolol decreased and then discontinued with possible TIA. Now resumed at 5mg  today (HR in mid 70s)  HLD -- Continue Pravastatin 40mg  daily.  DM -- Can resume metformin safely now >48 hours post contrast dye exposure with cardiac cath and CTs  The patient has had an uncomplicated hospital course and is recovering well. Femoral catheter site is stable.  He has been seen by Dr. Rayann Heman today and deemed ready for discharge home. A staff message has been sent to schedulers to set up a TOC follow-up appointments in 1 week. Discharge medications are listed below.   Discharge Vitals: Blood pressure 152/54, pulse 66, temperature 97.5 F (36.4 C), temperature source Oral, resp. rate 18, height 5\' 8"  (1.727 m), weight 180 lb (81.647 kg), SpO2 99 %.  Labs: Lab Results  Component Value Date   WBC 8.5 10/03/2014   HGB 10.7* 10/03/2014   HCT 31.4* 10/03/2014   MCV 87.7 10/03/2014   PLT 173 10/03/2014    Recent Labs Lab 09/30/14 1055  10/03/14 0240  NA 140  < > 142  K 4.5  < > 4.9  CL  97  < > 105  CO2 27  < > 25  BUN 31*  < > 21  CREATININE 0.96  < > 1.00  CALCIUM 9.6  < > 9.0  PROT 6.8  --   --   BILITOT 0.5  --   --   ALKPHOS 87  --   --   ALT 26  --   --   AST 24  --   --   GLUCOSE 118*  < > 159*  < > = values in this interval not displayed.  Lab Results  Component Value Date   CHOL 90 10/03/2014   HDL 38* 10/03/2014   LDLCALC 37 10/03/2014   TRIG 73 10/03/2014     Diagnostic Studies/Procedures   Ct  Angio Head W/cm &/or Wo Cm  10/03/2014   CLINICAL DATA:  Single episode of LEFT-sided weakness which is now resolved. Frontal headache.  EXAM: CT ANGIOGRAPHY HEAD AND NECK  TECHNIQUE: Multidetector CT imaging of the head and neck was performed using the standard protocol during bolus administration of intravenous contrast. Multiplanar CT image reconstructions and MIPs were obtained to evaluate the vascular anatomy. Carotid stenosis measurements (when applicable) are obtained utilizing NASCET criteria, using the distal internal carotid diameter as the denominator.  CONTRAST:  62mL OMNIPAQUE IOHEXOL 350 MG/ML SOLN  COMPARISON:  CT of the head October 02, 2014  FINDINGS: CTA HEAD FINDINGS  Anterior circulation: Patent cervical, petrous, cavernous and supra clinoid internal carotid arteries with severe calcific atherosclerosis. Normal appearance of the anterior middle cerebral arteries with patent anterior communicating artery.  Posterior circulation: Severe calcific atherosclerosis of the LEFT V4 origin with suspected in high-grade stenosis. Approximately 50% narrowing of the RIGHT V4 segment. Normal appearance of basilar artery main PA and branch vessels. Tiny bilateral posterior communicating arteries are present with normal appearance of posterior cerebral arteries.  No large vessel occlusion, aneurysm, suspicious luminal irregularity.  Review of the MIP images confirms the above findings.  CTA NECK FINDINGS  Moderate calcific atherosclerosis of the aortic arch, patent main branch vessels without hemodynamically significant stenosis at the origins, however within 3 mm origin of the LEFT subclavian artery there is approximately 50% narrowing by intimal thickening and calcific atherosclerosis. 2 mm eccentric intimal thickening calcific atherosclerosis along the Common carotid arteries without hemodynamically significant stenosis. Circumferential intimal thickening of the distal LEFT Common carotid artery. Severe  calcific atherosclerosis of the carotid bulbs bilaterally. No hemodynamically significant stenosis by NASCET criteria. Normal appearance of the cervical internal carotid arteries.  Non visualized vertebral artery, likely occluded at the origin. Widely patent origin of the RIGHT vertebral artery which is normal throughout the course. Minimal reconstitution of the distal LEFT V2 segment likely via muscular branches.  Multiple absent teeth. Median sternotomy and cardiac pacemaker in situ. Severe C5-6 and C6-7 degenerative discs. Included view of the lung apices demonstrates pulmonary vascular congestion and heterogeneous lung attenuation which may reflect pulmonary edema/small airway disease.  Review of the MIP images confirms the above findings.  IMPRESSION: CTA NECK: Severe calcific atherosclerosis of the carotid bulbs without hemodynamically significant stenosis.  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  CTA HEAD: Severe atherosclerosis of the bilateral intradural vertebral arteries with suspected hemodynamically significant stenosis.  Severe calcific atherosclerosis of the carotid siphons without hemodynamically significant stenosis.   Electronically Signed   By: Elon Alas   On: 10/03/2014 02:03   Dg Chest 2  View  09/30/2014   CLINICAL DATA:  One week history of difficulty breathing  EXAM: CHEST  2 VIEW  COMPARISON:  September 23, 2014  FINDINGS: There is no edema or consolidation. Heart is borderline enlarged with pulmonary vascular within normal limits. Pacemaker lead positions are unchanged. No pneumothorax. No adenopathy. There is old rib trauma on both sides, healed. Patient is status post coronary artery bypass grafting.  IMPRESSION: No edema or consolidation. No change in cardiac silhouette. Old rib trauma bilaterally. No pneumothorax.   Electronically Signed   By: Lowella Grip M.D.   On:  09/30/2014 11:13   Dg Chest 2 View  09/23/2014   CLINICAL DATA:  Shortness of breath.  EXAM: CHEST  2 VIEW  COMPARISON:  May 31, 2013.  FINDINGS: Stable cardiomediastinal silhouette. Both lungs are clear. Status post coronary artery bypass graft. No pneumothorax or pleural effusion is noted. Left-sided pacemaker is unchanged in position. The visualized skeletal structures are unremarkable.  IMPRESSION: No acute cardiopulmonary abnormality seen.   Electronically Signed   By: Sabino Dick M.D.   On: 09/23/2014 10:31   Ct Head Wo Contrast  10/02/2014   CLINICAL DATA:  Sudden onset of left arm weakness with mild dysarthria. Code stroke.  EXAM: CT HEAD WITHOUT CONTRAST  TECHNIQUE: Contiguous axial images were obtained from the base of the skull through the vertex without intravenous contrast.  COMPARISON:  CTs of the cervical spine 05/15/2008 and 10/11/2005.  FINDINGS: There is no evidence of acute intracranial hemorrhage, mass lesion, brain edema or extra-axial fluid collection. The ventricles and subarachnoid spaces are appropriately sized for age. There is no CT evidence of acute cortical infarction. There is minimal periventricular white matter disease. Prominent intracranial vascular calcifications are noted.  The visualized paranasal sinuses, mastoid air cells and middle ears are clear. The calvarium is intact.  IMPRESSION: 1. No acute intracranial findings.  No CT evidence of acute stroke. 2. Intracranial vascular calcifications. 3. These results were called by telephone at the time of interpretation on 10/02/2014 at 2:37 pm to Dr. Alexis Goodell , who verbally acknowledged these results.   Electronically Signed   By: Camie Patience M.D.   On: 10/02/2014 14:37   Ct Angio Neck W/cm &/or Wo/cm  10/03/2014   CLINICAL DATA:  Single episode of LEFT-sided weakness which is now resolved. Frontal headache.  EXAM: CT ANGIOGRAPHY HEAD AND NECK  TECHNIQUE: Multidetector CT imaging of the head and neck was  performed using the standard protocol during bolus administration of intravenous contrast. Multiplanar CT image reconstructions and MIPs were obtained to evaluate the vascular anatomy. Carotid stenosis measurements (when applicable) are obtained utilizing NASCET criteria, using the distal internal carotid diameter as the denominator.  CONTRAST:  88mL OMNIPAQUE IOHEXOL 350 MG/ML SOLN  COMPARISON:  CT of the head October 02, 2014  FINDINGS: CTA HEAD FINDINGS  Anterior circulation: Patent cervical, petrous, cavernous and supra clinoid internal carotid arteries with severe calcific atherosclerosis. Normal appearance of the anterior middle cerebral arteries with patent anterior communicating artery.  Posterior circulation: Severe calcific atherosclerosis of the LEFT V4 origin with suspected in high-grade stenosis. Approximately 50% narrowing of the RIGHT V4 segment. Normal appearance of basilar artery main PA and branch vessels. Tiny bilateral posterior communicating arteries are present with normal appearance of posterior cerebral arteries.  No large vessel occlusion, aneurysm, suspicious luminal irregularity.  Review of the MIP images confirms the above findings.  CTA NECK FINDINGS  Moderate calcific atherosclerosis of the aortic arch, patent main  branch vessels without hemodynamically significant stenosis at the origins, however within 3 mm origin of the LEFT subclavian artery there is approximately 50% narrowing by intimal thickening and calcific atherosclerosis. 2 mm eccentric intimal thickening calcific atherosclerosis along the Common carotid arteries without hemodynamically significant stenosis. Circumferential intimal thickening of the distal LEFT Common carotid artery. Severe calcific atherosclerosis of the carotid bulbs bilaterally. No hemodynamically significant stenosis by NASCET criteria. Normal appearance of the cervical internal carotid arteries.  Non visualized vertebral artery, likely occluded at the  origin. Widely patent origin of the RIGHT vertebral artery which is normal throughout the course. Minimal reconstitution of the distal LEFT V2 segment likely via muscular branches.  Multiple absent teeth. Median sternotomy and cardiac pacemaker in situ. Severe C5-6 and C6-7 degenerative discs. Included view of the lung apices demonstrates pulmonary vascular congestion and heterogeneous lung attenuation which may reflect pulmonary edema/small airway disease.  Review of the MIP images confirms the above findings.  IMPRESSION: CTA NECK: Severe calcific atherosclerosis of the carotid bulbs without hemodynamically significant stenosis.  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  CTA HEAD: Severe atherosclerosis of the bilateral intradural vertebral arteries with suspected hemodynamically significant stenosis.  Severe calcific atherosclerosis of the carotid siphons without hemodynamically significant stenosis.   Electronically Signed   By: Elon Alas   On: 10/03/2014 02:03      10/01/14 CARDIAC CATH NOTE Name: DENZAL MEIR MRN: [id number] DOB: Mar 31, 1936 Procedure: Right heart catheterization, PCI with angioplasty of chronic total occlusion of the RCA Indication: 78 yo WM with history of CAD s/p remote CABG with known occlusion of the LCx and RCA. He also has a history of ischemic cardiomyopathy with EF of 25% and CHF. He presents with refractory class 4 dyspnea despite optimal medical therapy. Right heart cath was performed to assess hemodynamics. The RCA was occluded with right to left and left to right collaterals and was suitable for CTO PCI. Right heart cath: the right groin was prepped, draped, and anesthetized with 1% lidocaine. Using a modified Seldinger technique, a 7 Fr sheath was introduced into the right femoral vein. A Swan Ganz catheter was used for recording hemodynamics and obtaining  measurements of oxygen saturations and cardiac output.  Findings: Pressures: RA: 7/6 mean 3 mm Hg RV: 37/7 mm Hg PA: 36/13 mean 22 mm Hg PCWP: 9/10 mean 8 mm Hg AO: 133/44 mean 75 mm Hg Saturations: PA: 65% AO: 94% Cardiac output 5.1 L/min Cardiac index: 2.63 L/min/meter squared Finding that hemodynamics were satisfactory we decided to proceed with planned PCI of RCA CTO Procedural Details: Both groins were prepped, draped, and anesthetized with 1% lidocaine. Using the modified Seldinger technique, an 8 Fr long sheath was introduced into the right femoral artery. Likewise a 6 Fr sheath was introduced into the left femoral artery. Weight-based heparin was given for anticoagulation. Once a therapeutic ACT was achieved, an 8 Fr LA 0.75 guide catheter was inserted via the right femoral access. A 6 Fr left Judkins catheter was used to visualize the collaterals from the LCA. Angiography demonstrated a 90% stenosis in the proximal RCA. This was followed by a 99% stenosis in the mid RCA after the takeoff of a high marginal branch. There was total occlusion in the mid RCA. Initially a Fielder XT coronary guidewire was used to cross the first 2 lesions and penetrated the proximal cap on the total occlusion. We attempted to get better  backup support with a Corsair and Turnpike twist catheters but neither catheter was able to cross the second lesion. We then used a 1.2 mm balloon to dilate the first 2 lesions. We were eventually able to cross the CTO with a Piliot 200 wire but the wire position was lost when trying to cross with a Tornus device. The Tornus device also could not cross the second lesion due to angulation and heavy calcification. The CTO was recrossed with a Fielder XT wire positioned in a large RV marginal branch. The lesions were then crossed with a Fine cross catheter and the Ramond Marrow was exchanged for a Mailman wire. Using this wire we performed progressive balloon dilitations of all three  lesions with a 1.2 mm, 1.5 mm, and 2.0 mm balloons. There was still severe wasting of the second lesion and this was diltated with a 2.25 noncompliant balloon with good expansion. At this point we were unable to cross with either a larger balloon or a stent again due the second lesion. The RCA was patent with dissection extending from the proximal to mid vessel at the bifurcation of the distal RCA and the RV marginal branch. There was marked improvement in antegrade flow TIMI 3. Given his extensive dissection he was not a candidate for rotational atherectomy at this time. We ended the procedure at this point with flow reestablished in the native vessel. The patient tolerated the procedure well. There were no immediate procedural complications. Femoral hemostasis was achieved with bilateral Angioseal devices. The venous sheath will be removed in 2 hours. The patient was transferred to the post catheterization recovery area for further monitoring. Lesion Data: Vessel: RCA CTO Percent stenosis (pre): 100% TIMI-flow (pre): 0% PTCA only Percent stenosis (post): 70% with extensive dissection. TIMI-flow (post): 3 Conclusions:  1. Normal right heart pressures. Low PCWP due to diuresis. 2. Successful CTO PCI of the native RCA. Restoration of TIMI 3 antegrade flow. Recommendations: Continue DAPT. Will monitor patient's symptomatic response. If he continues to have significant angina we may consider reassessing the RCA in 4-8 weeks. Once it has had a chance to heal he may be a candidate for rotational atherectomy and stenting. There is a reasonable chance that the vessel may heal with a satisfactory result.     Discharge Medications     Medication List    TAKE these medications        ASPIR-81 81 MG EC tablet  Generic drug:  aspirin  Take 81 mg by mouth at bedtime.     bisoprolol 5 MG tablet  Commonly known as:  ZEBETA  Take 1 tablet (5 mg total) by mouth daily.     clopidogrel 75 MG tablet    Commonly known as:  PLAVIX  TAKE 1 TABLET EVERY DAY WITH BREAKFAST     doxycycline 100 MG tablet  Commonly known as:  VIBRA-TABS  Take 1 tablet (100 mg total) by mouth every 12 (twelve) hours.     etodolac 400 MG tablet  Commonly known as:  LODINE  Take 400 mg by mouth daily.     furosemide 40 MG tablet  Commonly known as:  LASIX  Take 1.5 tablets (60 mg total) by mouth 2 (two) times daily.     insulin aspart protamine- aspart (70-30) 100 UNIT/ML injection  Commonly known as:  NOVOLOG MIX 70/30  Inject 12-20 Units into the skin 2 (two) times daily with a meal. Inject 20 units in the morning and 12 units in the evening  isosorbide mononitrate 30 MG 24 hr tablet  Commonly known as:  IMDUR  Take 0.5 tablets (15 mg total) by mouth daily.     lisinopril 2.5 MG tablet  Commonly known as:  PRINIVIL,ZESTRIL  Take 2.5 mg by mouth daily.     LORCET 5-325 MG per tablet  Generic drug:  HYDROcodone-acetaminophen  Take 1 tablet by mouth every 6 (six) hours as needed for pain.     LYRICA 75 MG capsule  Generic drug:  pregabalin  Take 75 mg by mouth daily.     metFORMIN 1000 MG tablet  Commonly known as:  GLUCOPHAGE  Take 1,000 mg by mouth 2 (two) times daily with a meal.     nitroGLYCERIN 0.4 MG SL tablet  Commonly known as:  NITROSTAT  Place 0.4 mg under the tongue every 5 (five) minutes as needed for chest pain.     potassium chloride 20 MEQ packet  Commonly known as:  KLOR-CON  Take 20 mEq by mouth daily.     pravastatin 40 MG tablet  Commonly known as:  PRAVACHOL  Take 40 mg by mouth at bedtime.     SKELAXIN 800 MG tablet  Generic drug:  metaxalone  Take 800 mg by mouth 2 (two) times daily.     vitamin B-12 1000 MCG tablet  Commonly known as:  CYANOCOBALAMIN  Take 1,000 mcg by mouth daily.        Disposition   The patient will be discharged in stable condition to home.  Follow-up Information    Follow up with Peter Martinique, MD.   Specialty:  Cardiology    Why:  The office will call you to make an appoinment., If you do not hear from them, please contact them., You should be seen within 1-2 weeks.   Contact information:   [place] [address] [phone]         Duration of Discharge Encounter: Greater than 30 minutes including physician and PA time.  Signed, Grandville Silos, Anndee Connett R PA-C 10/05/2014, 10:06 AM

## 2014-10-05 NOTE — Progress Notes (Signed)
Patient Name: Victor [name] Date of Encounter: 10/05/2014     Active Problems:   Type 2 diabetes mellitus   Chronic clinical systolic heart failure   Anemia-chronic\   CHF (congestive heart failure), NYHA class IV   SOB (shortness of breath)   CAD (coronary artery disease), native coronary artery   Essential hypertension   TIA (transient ischemic attack)   Left-sided weakness   Weakness   Coronary artery disease   GERD (gastroesophageal reflux disease)    SUBJECTIVE  Feeling well. Ready to go home. No CP or SOB  CURRENT MEDS . aspirin  81 mg Oral Daily  . clopidogrel  75 mg Oral Q breakfast  . doxycycline  100 mg Oral Q12H  . etodolac  400 mg Oral Daily  . heparin  5,000 Units Subcutaneous 3 times per day  . insulin aspart  0-20 Units Subcutaneous TID WC  . insulin aspart protamine- aspart  12 Units Subcutaneous Q supper  . insulin aspart protamine- aspart  20 Units Subcutaneous Q breakfast  . isosorbide mononitrate  15 mg Oral Daily  . metaxalone  800 mg Oral BID  . potassium chloride  20 mEq Oral Daily  . pravastatin  40 mg Oral QHS  . pregabalin  75 mg Oral Daily  . sodium chloride  3 mL Intravenous Q12H  . TRIPLE ANTIBIOTIC   Topical Daily  . vitamin B-12  1,000 mcg Oral Daily    OBJECTIVE  Filed Vitals:   10/04/14 0447 10/04/14 1404 10/04/14 2018 10/05/14 0544  BP: 146/50 110/37 120/84 152/54  Pulse: 73 66 76 66  Temp: 97.2 F (36.2 C) 98.2 F (36.8 C) 97.4 F (36.3 C) 97.5 F (36.4 C)  TempSrc: Oral Oral Oral Oral  Resp: 18 20 18 18   Height:      Weight: 181 lb 8 oz (82.328 kg)   180 lb (81.647 kg)  SpO2: 98% 97% 100% 99%    Intake/Output Summary (Last 24 hours) at 10/05/14 8938 Last data filed at 10/05/14 0543  Gross per 24 hour  Intake   1040 ml  Output   2000 ml  Net   -960 ml   Filed Weights   10/03/14 1755 10/04/14 0447 10/05/14 0544  Weight: 181 lb 3.5 oz (82.2 kg) 181 lb 8 oz (82.328 kg) 180 lb (81.647 kg)    PHYSICAL  EXAM  General: Pleasant, NAD. Neuro: Alert and oriented X 3. Moves all extremities spontaneously. Psych: Normal affect. HEENT:  Normal  Neck: Supple without bruits or JVD. Lungs:  Resp regular and unlabored, CTA. Heart: RRR no s3, s4, or murmurs. Abdomen: Soft, non-tender, non-distended, BS + x 4.  Extremities: No clubbing, cyanosis or edema. DP/PT/Radials 2+ and equal bilaterally. Some bruising over femoral cath sites but non tender and soft.   Accessory Clinical Findings  CBC  Recent Labs  10/03/14 0240  WBC 8.5  HGB 10.7*  HCT 31.4*  MCV 87.7  PLT 101   Basic Metabolic Panel  Recent Labs  10/03/14 0240  NA 142  K 4.9  CL 105  CO2 25  GLUCOSE 159*  BUN 21  CREATININE 1.00  CALCIUM 9.0   Hemoglobin A1C  Recent Labs  10/03/14 0240  HGBA1C 8.7*   Fasting Lipid Panel  Recent Labs  10/03/14 0240  CHOL 90  HDL 38*  LDLCALC 37  TRIG 73  CHOLHDL 2.4   TELE  NSR in 70s  Radiology/Studies  Ct Angio Head W/cm &/or Wo Cm  10/03/2014   CLINICAL DATA:  Single episode of LEFT-sided weakness which is now resolved. Frontal headache.  EXAM: CT ANGIOGRAPHY HEAD AND NECK  TECHNIQUE: Multidetector CT imaging of the head and neck was performed using the standard protocol during bolus administration of intravenous contrast. Multiplanar CT image reconstructions and MIPs were obtained to evaluate the vascular anatomy. Carotid stenosis measurements (when applicable) are obtained utilizing NASCET criteria, using the distal internal carotid diameter as the denominator.  CONTRAST:  34mL OMNIPAQUE IOHEXOL 350 MG/ML SOLN  COMPARISON:  CT of the head October 02, 2014  FINDINGS: CTA HEAD FINDINGS  Anterior circulation: Patent cervical, petrous, cavernous and supra clinoid internal carotid arteries with severe calcific atherosclerosis. Normal appearance of the anterior middle cerebral arteries with patent anterior communicating artery.  Posterior circulation: Severe calcific  atherosclerosis of the LEFT V4 origin with suspected in high-grade stenosis. Approximately 50% narrowing of the RIGHT V4 segment. Normal appearance of basilar artery main PA and branch vessels. Tiny bilateral posterior communicating arteries are present with normal appearance of posterior cerebral arteries.  No large vessel occlusion, aneurysm, suspicious luminal irregularity.  Review of the MIP images confirms the above findings.  CTA NECK FINDINGS  Moderate calcific atherosclerosis of the aortic arch, patent main branch vessels without hemodynamically significant stenosis at the origins, however within 3 mm origin of the LEFT subclavian artery there is approximately 50% narrowing by intimal thickening and calcific atherosclerosis. 2 mm eccentric intimal thickening calcific atherosclerosis along the Common carotid arteries without hemodynamically significant stenosis. Circumferential intimal thickening of the distal LEFT Common carotid artery. Severe calcific atherosclerosis of the carotid bulbs bilaterally. No hemodynamically significant stenosis by NASCET criteria. Normal appearance of the cervical internal carotid arteries.  Non visualized vertebral artery, likely occluded at the origin. Widely patent origin of the RIGHT vertebral artery which is normal throughout the course. Minimal reconstitution of the distal LEFT V2 segment likely via muscular branches.  Multiple absent teeth. Median sternotomy and cardiac pacemaker in situ. Severe C5-6 and C6-7 degenerative discs. Included view of the lung apices demonstrates pulmonary vascular congestion and heterogeneous lung attenuation which may reflect pulmonary edema/small airway disease.  Review of the MIP images confirms the above findings.  IMPRESSION: CTA NECK: Severe calcific atherosclerosis of the carotid bulbs without hemodynamically significant stenosis.  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  CTA HEAD: Severe atherosclerosis of the bilateral intradural vertebral arteries with suspected hemodynamically significant stenosis.  Severe calcific atherosclerosis of the carotid siphons without hemodynamically significant stenosis.   Electronically Signed   By: Elon Alas   On: 10/03/2014 02:03   Dg Chest 2 View  09/30/2014   CLINICAL DATA:  One week history of difficulty breathing  EXAM: CHEST  2 VIEW  COMPARISON:  September 23, 2014  FINDINGS: There is no edema or consolidation. Heart is borderline enlarged with pulmonary vascular within normal limits. Pacemaker lead positions are unchanged. No pneumothorax. No adenopathy. There is old rib trauma on both sides, healed. Patient is status post coronary artery bypass grafting.  IMPRESSION: No edema or consolidation. No change in cardiac silhouette. Old rib trauma bilaterally. No pneumothorax.   Electronically Signed   By: Lowella Grip M.D.   On: 09/30/2014 11:13   Dg Chest 2 View  09/23/2014   CLINICAL DATA:  Shortness of breath.  EXAM: CHEST  2 VIEW  COMPARISON:  May 31, 2013.  FINDINGS: Stable cardiomediastinal silhouette. Both lungs  are clear. Status post coronary artery bypass graft. No pneumothorax or pleural effusion is noted. Left-sided pacemaker is unchanged in position. The visualized skeletal structures are unremarkable.  IMPRESSION: No acute cardiopulmonary abnormality seen.   Electronically Signed   By: Sabino Dick M.D.   On: 09/23/2014 10:31   Ct Head Wo Contrast  10/02/2014   CLINICAL DATA:  Sudden onset of left arm weakness with mild dysarthria. Code stroke.  EXAM: CT HEAD WITHOUT CONTRAST  TECHNIQUE: Contiguous axial images were obtained from the base of the skull through the vertex without intravenous contrast.  COMPARISON:  CTs of the cervical spine 05/15/2008 and 10/11/2005.  FINDINGS: There is no evidence of acute intracranial hemorrhage, mass lesion, brain edema or extra-axial fluid  collection. The ventricles and subarachnoid spaces are appropriately sized for age. There is no CT evidence of acute cortical infarction. There is minimal periventricular white matter disease. Prominent intracranial vascular calcifications are noted.  The visualized paranasal sinuses, mastoid air cells and middle ears are clear. The calvarium is intact.  IMPRESSION: 1. No acute intracranial findings.  No CT evidence of acute stroke. 2. Intracranial vascular calcifications. 3. These results were called by telephone at the time of interpretation on 10/02/2014 at 2:37 pm to Dr. Alexis Goodell , who verbally acknowledged these results.   Electronically Signed   By: Camie Patience M.D.   On: 10/02/2014 14:37   Ct Angio Neck W/cm &/or Wo/cm  10/03/2014   CLINICAL DATA:  Single episode of LEFT-sided weakness which is now resolved. Frontal headache.  EXAM: CT ANGIOGRAPHY HEAD AND NECK  TECHNIQUE: Multidetector CT imaging of the head and neck was performed using the standard protocol during bolus administration of intravenous contrast. Multiplanar CT image reconstructions and MIPs were obtained to evaluate the vascular anatomy. Carotid stenosis measurements (when applicable) are obtained utilizing NASCET criteria, using the distal internal carotid diameter as the denominator.  CONTRAST:  85mL OMNIPAQUE IOHEXOL 350 MG/ML SOLN  COMPARISON:  CT of the head October 02, 2014  FINDINGS: CTA HEAD FINDINGS  Anterior circulation: Patent cervical, petrous, cavernous and supra clinoid internal carotid arteries with severe calcific atherosclerosis. Normal appearance of the anterior middle cerebral arteries with patent anterior communicating artery.  Posterior circulation: Severe calcific atherosclerosis of the LEFT V4 origin with suspected in high-grade stenosis. Approximately 50% narrowing of the RIGHT V4 segment. Normal appearance of basilar artery main PA and branch vessels. Tiny bilateral posterior communicating arteries are  present with normal appearance of posterior cerebral arteries.  No large vessel occlusion, aneurysm, suspicious luminal irregularity.  Review of the MIP images confirms the above findings.  CTA NECK FINDINGS  Moderate calcific atherosclerosis of the aortic arch, patent main branch vessels without hemodynamically significant stenosis at the origins, however within 3 mm origin of the LEFT subclavian artery there is approximately 50% narrowing by intimal thickening and calcific atherosclerosis. 2 mm eccentric intimal thickening calcific atherosclerosis along the Common carotid arteries without hemodynamically significant stenosis. Circumferential intimal thickening of the distal LEFT Common carotid artery. Severe calcific atherosclerosis of the carotid bulbs bilaterally. No hemodynamically significant stenosis by NASCET criteria. Normal appearance of the cervical internal carotid arteries.  Non visualized vertebral artery, likely occluded at the origin. Widely patent origin of the RIGHT vertebral artery which is normal throughout the course. Minimal reconstitution of the distal LEFT V2 segment likely via muscular branches.  Multiple absent teeth. Median sternotomy and cardiac pacemaker in situ. Severe C5-6 and C6-7 degenerative discs. Included view of the lung apices  demonstrates pulmonary vascular congestion and heterogeneous lung attenuation which may reflect pulmonary edema/small airway disease.  Review of the MIP images confirms the above findings.  IMPRESSION: CTA NECK: Severe calcific atherosclerosis of the carotid bulbs without hemodynamically significant stenosis.  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  CTA HEAD: Severe atherosclerosis of the bilateral intradural vertebral arteries with suspected hemodynamically significant stenosis.  Severe calcific atherosclerosis of the carotid siphons without  hemodynamically significant stenosis.   Electronically Signed   By: Elon Alas   On: 10/03/2014 02:03    ASSESSMENT AND PLAN  Victor [name] is a 78 y.o. male with a history of HLD, DM, ischemic cardiomyopathy (EF 25%), significant CAD s/p CABGx3 (1990) with previously planned PCI of CTO of RCA on 10/01/14 and recent hospitalization for CHF who presented to [facility] shortly after discharge on 09/30/14 with SOB.   CAD - s/p CABG x3 1990; occluded SVG to RCA and CTO of the native RCA on cardiac catheterization in June 2014. Cath in September 2015 showed occlusion of the SVG to OM. -- Serial troponin negative. -- Doing well s/p PCI of CTO by Dr Martinique. Per Dr Martinique, will need to reassess RCA in 4-8 weeks if he has any significant angina, once he has had a chance to heal he may need rotational arthrectomy and stenting of his RCA. -- Continue DAPT with ASA/plavix, BB and statin. Continue Imdur 15mg  daily. -- Some bruising over femoral cath sites but non tender and soft.   TIA- he had transient neuro symptoms following his heart cath which have resolved.  Acute on chronic systolic CHF/ischemic CM- EF 35% on repeat ECHO this admission with inferolateral severe hypokinesis, anterolateral hypokinesis, normal RV size with mildly decreased systolic function, mild MR and mod pulm HTN  -- Upon admission: proBNP elevated to 1605 on 09/30/2014. Patient does not appear volume overloaded on physical exam. CXR negative. -- Received IV diuresis with some improvement. -- S/p R/LHC on 10/01/14 with normal right heart pressures and low PCWP due to diuresis -- Continue Lisinopril 2.5mg  daily and bisoprolol 5mg  daily.  Left Elbow Wound -- Culture from last admission, +staph, on 10 day course of Doxycycline  Bradycardia -- Bisprolol decreased and then discontinued with possible TIA. Will resume at 5mg  today (HR in mid 70s)  HLD -- Continue Pravastatin 40mg  daily.  DM -- Can resume metformin  safely now >48 hours post contrast dye exposure with cardiac cath and CTs  Signed, Eileen Stanford PA-C  Pager [phone]  I have seen, examined the patient, and reviewed the above assessment and plan.  Changes to above are made where necessary.  Doing very well today and wants to go home Will need TOC visit with an APP.  Co Sign: Thompson Grayer, MD 10/05/2014 9:18 AM

## 2014-10-05 NOTE — Plan of Care (Signed)
Problem: Phase I Progression Outcomes Goal: Tolerating diet Outcome: Completed/Met Date Met:  10/05/14  Problem: Phase II Progression Outcomes Goal: Pain controlled on oral analgesia Outcome: Completed/Met Date Met:  10/05/14

## 2014-10-05 NOTE — Progress Notes (Signed)
Pt d/c'd in stable condition with family. No further questions

## 2014-10-05 NOTE — Discharge Instructions (Signed)
Coronary Angiogram A coronary angiogram, also called coronary angiography, is an X-ray procedure used to look at the arteries in the heart. In this procedure, a dye (contrast dye) is injected through a long, hollow tube (catheter). The catheter is about the size of a piece of cooked spaghetti and is inserted through your groin, wrist, or arm. The dye is injected into each artery, and X-rays are then taken to show if there is a blockage in the arteries of your heart. LET [facility] CARE PROVIDER KNOW ABOUT:  Any allergies you have, including allergies to shellfish or contrast dye.   All medicines you are taking, including vitamins, herbs, eye drops, creams, and over-the-counter medicines.   Previous problems you or members of your family have had with the use of anesthetics.   Any blood disorders you have.   Previous surgeries you have had.  History of kidney problems or failure.   Other medical conditions you have. RISKS AND COMPLICATIONS  Generally, a coronary angiogram is a safe procedure. However, problems can occur and include:  Allergic reaction to the dye.  Bleeding from the access site or other locations.  Kidney injury, especially in people with impaired kidney function.  Stroke (rare).  Heart attack (rare). BEFORE THE PROCEDURE   Do not eat or drink anything after midnight the night before the procedure or as directed by your health care provider.   Ask your health care provider about changing or stopping your regular medicines. This is especially important if you are taking diabetes medicines or blood thinners. PROCEDURE  You may be given a medicine to help you relax (sedative) before the procedure. This medicine is given through an intravenous (IV) access tube that is inserted into one of your veins.   The area where the catheter will be inserted will be washed and shaved. This is usually done in the groin but may be done in the fold of your arm (near your  elbow) or in the wrist.   A medicine will be given to numb the area where the catheter will be inserted (local anesthetic).   The health care provider will insert the catheter into an artery. The catheter will be guided by using a special type of X-ray (fluoroscopy) of the blood vessel being examined.   A special dye will then be injected into the catheter, and X-rays will be taken. The dye will help to show where any narrowing or blockages are located in the heart arteries.  AFTER THE PROCEDURE   If the procedure is done through the leg, you will be kept in bed lying flat for several hours. You will be instructed to not bend or cross your legs.  The insertion site will be checked frequently.   The pulse in your feet or wrist will be checked frequently.   Additional blood tests, X-rays, and an electrocardiogram may be done.  Document Released: 05/14/2003 Document Revised: 03/24/2014 Document Reviewed: 04/01/2013 [facility] Patient Information 2015 [place], [place]. This information is not intended to replace advice given to you by your health care provider. Make sure you discuss any questions you have with your health care provider. Stroke Prevention Some health problems and behaviors may make it more likely for you to have a stroke. Below are ways to lessen your risk of having a stroke.   Be active for at least 30 minutes on most or all days.  Do not smoke. Try not to be around others who smoke.  Do not drink too much  alcohol.  Do not have more than 2 drinks a day if you are a man.  Do not have more than 1 drink a day if you are a woman and are not pregnant.  Eat healthy foods, such as fruits and vegetables. If you were put on a specific diet, follow the diet as told.  Keep your cholesterol levels under control through diet and medicines. Look for foods that are low in saturated fat, trans fat, cholesterol, and are high in fiber.  If you have diabetes, follow all diet plans  and take your medicine as told.  If you have high blood pressure (hypertension), follow all diet plans and take your medicine as told.  Keep a healthy weight. Eat foods that are low in calories, salt, saturated fat, trans fat, and cholesterol.  Do not take drugs.  Avoid birth control pills, if this applies. Talk to your doctor about the risks of taking birth control pills.  Talk to your doctor if you have sleep problems (sleep apnea).  Take all medicine as told by your doctor.  You may be told to take aspirin or blood thinner medicine. Take this medicine as told by your doctor.  Understand your medicine instructions.  Make sure any other conditions you have are being taken care of. GET HELP RIGHT AWAY IF:  You suddenly lose feeling (you feel numb) or have weakness in your face, arm, or leg.  Your face or eyelid hangs down to one side.  You suddenly feel confused.  You have trouble talking (aphasia) or understanding what people are saying.  You suddenly have trouble seeing in one or both eyes.  You suddenly have trouble walking.  You are dizzy.  You lose your balance or your movements are clumsy (uncoordinated).  You suddenly have a very bad headache and you do not know the cause.  You have new chest pain.  Your heart feels like it is fluttering or skipping a beat (irregular heartbeat). Do not wait to see if the symptoms above go away. Get help right away. Call your local emergency services (911 in [place] Do not drive yourself to the hospital. Document Released: 05/08/2012 Document Revised: 03/24/2014 Document Reviewed: 05/10/2013 [facility] Patient Information 2015 [place], [place]. This information is not intended to replace advice given to you by your health care provider. Make sure you discuss any questions you have with your health care provider.

## 2014-10-05 NOTE — Plan of Care (Signed)
Problem: Phase II Progression Outcomes Goal: Pain controlled with appropriate interventions Outcome: Completed/Met Date Met:  10/05/14 Goal: Tolerates diet Outcome: Completed/Met Date Met:  10/05/14     

## 2014-10-14 ENCOUNTER — Encounter: Payer: Managed Care, Other (non HMO) | Admitting: Internal Medicine

## 2014-10-20 ENCOUNTER — Ambulatory Visit (INDEPENDENT_AMBULATORY_CARE_PROVIDER_SITE_OTHER): Payer: Medicare Other | Admitting: Cardiology

## 2014-10-20 ENCOUNTER — Encounter: Payer: Self-pay | Admitting: Cardiology

## 2014-10-20 VITALS — BP 124/56 | HR 69 | Ht 68.0 in | Wt 185.9 lb

## 2014-10-20 DIAGNOSIS — I25119 Atherosclerotic heart disease of native coronary artery with unspecified angina pectoris: Secondary | ICD-10-CM

## 2014-10-20 DIAGNOSIS — I255 Ischemic cardiomyopathy: Secondary | ICD-10-CM | POA: Diagnosis not present

## 2014-10-20 DIAGNOSIS — I2589 Other forms of chronic ischemic heart disease: Secondary | ICD-10-CM | POA: Diagnosis not present

## 2014-10-20 DIAGNOSIS — Z95810 Presence of automatic (implantable) cardiac defibrillator: Secondary | ICD-10-CM | POA: Diagnosis not present

## 2014-10-20 DIAGNOSIS — I5022 Chronic systolic (congestive) heart failure: Secondary | ICD-10-CM

## 2014-10-20 DIAGNOSIS — I10 Essential (primary) hypertension: Secondary | ICD-10-CM | POA: Diagnosis not present

## 2014-10-20 MED ORDER — LISINOPRIL 2.5 MG PO TABS: 2.5000 mg | ORAL_TABLET | Freq: Every day | ORAL | Status: DC

## 2014-10-20 NOTE — Assessment & Plan Note (Signed)
a. s/p CABG 1990  + occlusion of both VGs  b. s/p PCI of CTO of RCA 10/05/14  Doing well today no SOB which is his anginal equivalent.  Discussed with Dr. Martinique if he does have recurrent angina(SOB) would recath.  Otherwise his energy should continue to improve.  He will see Dr. P. Martinique back in 6 weeks, though pt and his wife are aware to call if any problems.

## 2014-10-20 NOTE — Assessment & Plan Note (Signed)
stable °

## 2014-10-20 NOTE — Patient Instructions (Signed)
Your physician recommends that you schedule a follow-up appointment in 6 weeks with Dr.Jordan

## 2014-10-20 NOTE — Assessment & Plan Note (Signed)
Euvolemic, stable wt, watching his salt intake.  He does weigh dialy

## 2014-10-20 NOTE — Progress Notes (Signed)
10/20/2014   PCP: Leonides Sake, MD   Chief Complaint  Patient presents with  . Follow-up    post cath 10/01/14, no chest pain, dyspnea or sweeling.     Primary Cardiologist:Dr. P. Martinique   HPI:  78 y.o. male with a history of HLD, DM, ischemic cardiomyopathy (EF 25% with ICD), significant CAD s/p CABGx3 (1990)  And both VGs totally occluded, with CTO of RCA on last cath 07/2014. and recent hospitalization 09/23/14 to 09/26/14 for CHF who presented to [facility] shortly after discharge on 09/30/14 with SOB. With his know chronic occl of RCA and SOB as anginal equivalent, planned PCI had been scheduled priro to last admit.    He underwent PTCA to CTO of RCA.  This was successful.  There was dissection and thought is to proceed with repeat cath if any more SOB.  Pt is here today for follow up.     He continues with ASA and Plavix.  His energy is good some days and he is climbing ladders then other days no energy.  He denies chest pain which he has never had. He also denies SOB.  There was concern for CVA in hospital but it was determined AMS change was related to sedation for back pain.  ( chronic DDD) .       He also has a history of DM type 2, VT, PAD with carotid disease, Hyperlipidemia, and obesity.   Allergies  Allergen Reactions  . Coreg [Carvedilol] Other (See Comments)    Shortness of breath ,fatigue ,dizzyness   . Lovastatin Other (See Comments)    CK elevation, Myalgias    Current Outpatient Prescriptions  Medication Sig Dispense Refill  . aspirin (ASPIR-81) 81 MG EC tablet Take 81 mg by mouth at bedtime.     . bisoprolol (ZEBETA) 5 MG tablet Take 1 tablet (5 mg total) by mouth daily. 30 tablet 11  . clopidogrel (PLAVIX) 75 MG tablet TAKE 1 TABLET EVERY DAY WITH BREAKFAST 90 tablet 3  . etodolac (LODINE) 400 MG tablet Take 400 mg by mouth daily.     . furosemide (LASIX) 40 MG tablet Take 1.5 tablets (60 mg total) by mouth 2 (two) times daily. 270 tablet 3  .  HYDROcodone-acetaminophen (LORCET) 5-325 MG per tablet Take 1 tablet by mouth every 6 (six) hours as needed for pain.    Marland Kitchen insulin aspart protamine- aspart (NOVOLOG MIX 70/30) (70-30) 100 UNIT/ML injection Inject 12-20 Units into the skin 2 (two) times daily with a meal. Inject 20 units in the morning and 12 units in the evening    . isosorbide mononitrate (IMDUR) 30 MG 24 hr tablet Take 0.5 tablets (15 mg total) by mouth daily. 30 tablet 3  . lisinopril (PRINIVIL,ZESTRIL) 2.5 MG tablet Take 1 tablet (2.5 mg total) by mouth daily. 90 tablet 2  . metaxalone (SKELAXIN) 800 MG tablet Take 800 mg by mouth 2 (two) times daily.      . metFORMIN (GLUCOPHAGE) 1000 MG tablet Take 1,000 mg by mouth 2 (two) times daily with a meal.    . nitroGLYCERIN (NITROSTAT) 0.4 MG SL tablet Place 0.4 mg under the tongue every 5 (five) minutes as needed for chest pain.    . potassium chloride (KLOR-CON) 20 MEQ packet Take 20 mEq by mouth daily. 90 tablet 3  . pravastatin (PRAVACHOL) 40 MG tablet Take 40 mg by mouth at bedtime.     . pregabalin (LYRICA) 75 MG  capsule Take 75 mg by mouth daily.     . vitamin B-12 (CYANOCOBALAMIN) 1000 MCG tablet Take 1,000 mcg by mouth daily.     No current facility-administered medications for this visit.    Past Medical History  Diagnosis Date  . Occlusion and stenosis of carotid artery without mention of cerebral infarction   . Raynaud's syndrome   . Paroxysmal ventricular tachycardia   . Peripheral vascular disease   . Cardiomyopathy, ischemic     a. s/p ICD placement  . Automatic implantable cardioverter-defibrillator in situ   . HLD (hyperlipidemia)   . GERD (gastroesophageal reflux disease)   . Chronic lower back pain   . Coronary artery disease     a. s/p CABG 1990  b. s/p PCI of CTO of RCA 10/05/14  . Hypertension   . Type II diabetes mellitus   . Arthritis   . TIA (transient ischemic attack)     a. s/p LHC on 10/01/14     Past Surgical History  Procedure  Laterality Date  . Anal fistulectomy  2000's  . Cataract extraction, bilateral Bilateral ~ 2009  . Cardiac defibrillator placement  02/1989-12/2011    "I've had a total of 6 or 7 defibrillators put in" (09/30/2014)  . Finger fracture surgery Left ~ 2000    "pointer"  . Coronary artery bypass graft  01/1989    "CABG X3"   . Fracture surgery    . Coronary angioplasty with stent placement  04/2013    "1"  . Cardiac catheterization  07/2014  . Coronary angioplasty  10/01/14    PTVA to CTO with restoration of TIMI 3    YSA:[id number]:UX colds or fevers, pt's weight is stable on home scales.  Skin:no rashes or ulcers HEENT:no blurred vision, no congestion CV:see HPI PUL:see HPI GI:no diarrhea constipation or melena, no indigestion GU:no hematuria, no dysuria MS:no joint pain, no claudication Neuro:no syncope, no lightheadedness Endo:+ diabetes stable, no thyroid disease  Wt Readings from Last 3 Encounters:  10/20/14 185 lb 14.4 oz (84.324 kg)  10/05/14 180 lb (81.647 kg)  09/26/14 182 lb 1.6 oz (82.6 kg)    PHYSICAL EXAM BP 124/56 mmHg  Pulse 69  Ht 5\' 8"  (1.727 m)  Wt 185 lb 14.4 oz (84.324 kg)  BMI 28.27 kg/m2 General:Pleasant affect, NAD Skin:Warm and dry, brisk capillary refill HEENT:normocephalic, sclera clear, mucus membranes moist Neck:supple, no JVD, no bruits  Heart:S1S2 RRR without murmur, gallup, rub or click Lungs:clear without rales, rhonchi, or wheezes NAT:FTDD, non tender, + BS, do not palpate liver spleen or masses Ext:no lower ext edema, 2+ pedal pulses, 2+ radial pulses Neuro:alert and oriented, MAE, follows commands, + facial symmetry  EKG: SR with Sinus arrhthymias with PACs, 1 AV block. PR 254 no acute changes otherwise.   ASSESSMENT AND PLAN Coronary artery disease a. s/p CABG 1990  + occlusion of both VGs  b. s/p PCI of CTO of RCA 10/05/14  Doing well today no SOB which is his anginal equivalent.  Discussed with Dr. Martinique if he does have recurrent  angina(SOB) would recath.  Otherwise his energy should continue to improve.  He will see Dr. P. Martinique back in 6 weeks, though pt and his wife are aware to call if any problems.  Chronic clinical systolic heart failure Euvolemic, stable wt, watching his salt intake.  He does weigh dialy  Essential hypertension stable  Automatic implantable cardioverter-defibrillator in situ stable  Cardiomyopathy, ischemic See above notes, stable

## 2014-10-20 NOTE — Assessment & Plan Note (Signed)
See above notes, stable

## 2014-10-21 DIAGNOSIS — M479 Spondylosis, unspecified: Secondary | ICD-10-CM | POA: Diagnosis not present

## 2014-10-27 ENCOUNTER — Encounter: Payer: Self-pay | Admitting: *Deleted

## 2014-10-27 ENCOUNTER — Telehealth: Payer: Self-pay | Admitting: Cardiology

## 2014-10-27 NOTE — Telephone Encounter (Signed)
LMTCB

## 2014-10-27 NOTE — Telephone Encounter (Signed)
Spoke with Victor [name], patient's wife. She reports patient has staph infection of elbow, discovered while in hospital. She reports he took his 10 day course of doxycycline but that his elbow is becoming sore again and she thinks he may need more abx. I informed Victor [name], wife, that patient will probably need to be evaluated by PCP, as labs may need to be ordered and/or an abx or stronger abx than previously prescribed may need to be ordered. She reports that the cardiologist ordered the test in the hospital to find out if he had this infection, but I informed her any type of infection issue is best handled by primary care. She voiced understanding.

## 2014-10-27 NOTE — Telephone Encounter (Signed)
New Message        Pt's wife calling stating that while pt was in the hospital for cardiac issues he developed a staff infection and was given an antibiotic. Pt is still having some issues and is out of antibiotics. Please call back and advise.

## 2014-10-30 ENCOUNTER — Encounter (HOSPITAL_COMMUNITY): Payer: Self-pay | Admitting: Internal Medicine

## 2014-11-07 DIAGNOSIS — E1149 Type 2 diabetes mellitus with other diabetic neurological complication: Secondary | ICD-10-CM | POA: Diagnosis not present

## 2014-11-07 DIAGNOSIS — E780 Pure hypercholesterolemia: Secondary | ICD-10-CM | POA: Diagnosis not present

## 2014-11-13 ENCOUNTER — Other Ambulatory Visit: Payer: Self-pay | Admitting: Internal Medicine

## 2014-11-27 ENCOUNTER — Ambulatory Visit (INDEPENDENT_AMBULATORY_CARE_PROVIDER_SITE_OTHER): Payer: Medicare Other | Admitting: Cardiology

## 2014-11-27 ENCOUNTER — Encounter: Payer: Self-pay | Admitting: Cardiology

## 2014-11-27 VITALS — BP 103/59 | HR 71 | Ht 68.0 in | Wt 194.5 lb

## 2014-11-27 DIAGNOSIS — I5022 Chronic systolic (congestive) heart failure: Secondary | ICD-10-CM

## 2014-11-27 DIAGNOSIS — I25119 Atherosclerotic heart disease of native coronary artery with unspecified angina pectoris: Secondary | ICD-10-CM | POA: Diagnosis not present

## 2014-11-27 DIAGNOSIS — I255 Ischemic cardiomyopathy: Secondary | ICD-10-CM | POA: Diagnosis not present

## 2014-11-27 DIAGNOSIS — Z95810 Presence of automatic (implantable) cardiac defibrillator: Secondary | ICD-10-CM | POA: Diagnosis not present

## 2014-11-27 DIAGNOSIS — E1159 Type 2 diabetes mellitus with other circulatory complications: Secondary | ICD-10-CM | POA: Diagnosis not present

## 2014-11-27 MED ORDER — BISOPROLOL FUMARATE 5 MG PO TABS: 2.5000 mg | ORAL_TABLET | Freq: Every day | ORAL | Status: DC

## 2014-11-27 NOTE — Progress Notes (Signed)
Victor [name] Date of Birth: 08-31-1936 Medical Record #[id number]  History of Present Illness: Mr. Flye is seen for follow up CAD. He has a  history of chronic ischemic heart disease. He is s/p CABG x 2 in 1990 at [facility]. In June 2014 he presented with unstable angina. At that time the SVG to the RCA was occluded. There was a CTO of the native RCA. The SVG to OM had a severe stenosis and this was successfully stented. In September 2015 he presented with progressive angina and abnormal stress test. Cardiac cath at that time showed occlusion of the SVG to the OM. The OM had minimal collateral flow. He continued to have class 3-4 angina. His anginal symptoms are predominantly dyspnea and fatigue. He states he has never had chest pain. On 10/01/14 he underwent CTO PCI of the RCA. This was a difficult procedure due to severe calcification of the vessel. We were able to cross and expand the vessel with a 2.0 mm balloon with resultant dissection but restoration of TIMI 3 flow. Afterwards he noted a significant improvement in his dyspnea that has persisted. Today he still complains of fatigue and lack of energy that varies from day to day. He has trouble with his balance. He has fallen a couple of times. His sugars are fluctuating a lot. He complains of orthostatic dizziness. His weight has been stable at home.     He has a history of chronic systolic CHF and is s/p ICD placement. Prior Echo was technically limited and EF could not be accurately assessed. EF by Myoview was 25%. Right heart cath in June 2014 showed fairly normal right heart and LV filling pressures.  He also has a history of DM type 2, VT, PAD with carotid disease, Hyperlipidemia, cervical disc disease, and obesity.      Medication List       This list is accurate as of: 11/27/14 10:55 AM.  Always use your most recent med list.               ASPIR-81 81 MG EC tablet  Generic drug:  aspirin  Take 81 mg by mouth at bedtime.     bisoprolol 5 MG tablet  Commonly known as:  ZEBETA  Take 0.5 tablets (2.5 mg total) by mouth daily.     clopidogrel 75 MG tablet  Commonly known as:  PLAVIX  TAKE 1 TABLET EVERY DAY WITH BREAKFAST     etodolac 400 MG tablet  Commonly known as:  LODINE  Take 400 mg by mouth daily.     furosemide 40 MG tablet  Commonly known as:  LASIX  Take 1.5 tablets (60 mg total) by mouth 2 (two) times daily.     insulin aspart protamine- aspart (70-30) 100 UNIT/ML injection  Commonly known as:  NOVOLOG MIX 70/30  Inject 12-20 Units into the skin 2 (two) times daily with a meal. Inject 20 units in the morning and 12 units in the evening     lisinopril 2.5 MG tablet  Commonly known as:  PRINIVIL,ZESTRIL  Take 1 tablet (2.5 mg total) by mouth daily.     LORCET 5-325 MG per tablet  Generic drug:  HYDROcodone-acetaminophen  Take 1 tablet by mouth every 6 (six) hours as needed for pain.     LYRICA 75 MG capsule  Generic drug:  pregabalin  Take 75 mg by mouth daily.     metFORMIN 1000 MG tablet  Commonly known as:  GLUCOPHAGE  Take  1,000 mg by mouth 2 (two) times daily with a meal.     nitroGLYCERIN 0.4 MG SL tablet  Commonly known as:  NITROSTAT  Place 0.4 mg under the tongue every 5 (five) minutes as needed for chest pain.     potassium chloride 20 MEQ packet  Commonly known as:  KLOR-CON  Take 20 mEq by mouth daily.     pravastatin 40 MG tablet  Commonly known as:  PRAVACHOL  Take 40 mg by mouth at bedtime.     vitamin B-12 1000 MCG tablet  Commonly known as:  CYANOCOBALAMIN  Take 1,000 mcg by mouth daily.         Allergies  Allergen Reactions  . Coreg [Carvedilol] Other (See Comments)    Shortness of breath ,fatigue ,dizzyness   . Lovastatin Other (See Comments)    CK elevation, Myalgias    Past Medical History  Diagnosis Date  . Occlusion and stenosis of carotid artery without mention of cerebral infarction   . Raynaud's syndrome   . Paroxysmal ventricular  tachycardia   . Peripheral vascular disease   . Cardiomyopathy, ischemic     a. s/p ICD placement  . Automatic implantable cardioverter-defibrillator in situ   . HLD (hyperlipidemia)   . GERD (gastroesophageal reflux disease)   . Chronic lower back pain   . Coronary artery disease     a. s/p CABG 1990  b. s/p PCI of CTO of RCA 10/05/14  . Hypertension   . Type II diabetes mellitus   . Arthritis   . TIA (transient ischemic attack)     a. s/p LHC on 10/01/14     Past Surgical History  Procedure Laterality Date  . Anal fistulectomy  2000's  . Cataract extraction, bilateral Bilateral ~ 2009  . Cardiac defibrillator placement  02/1989-12/2011    "I've had a total of 6 or 7 defibrillators put in" (09/30/2014)  . Finger fracture surgery Left ~ 2000    "pointer"  . Coronary artery bypass graft  01/1989    "CABG X3"   . Fracture surgery    . Coronary angioplasty with stent placement  04/2013    "1"  . Cardiac catheterization  07/2014  . Coronary angioplasty  10/01/14    PTVA to CTO with restoration of TIMI 3  . Implantable cardioverter defibrillator (icd) generator change Left 10/05/2011    Procedure: ICD GENERATOR CHANGE;  Surgeon: Evans Lance, MD;  Location: [facility] CATH LAB;  Service: Cardiovascular;  Laterality: Left;  . Left and right heart catheterization with coronary/graft angiogram N/A 04/26/2013    Procedure: LEFT AND RIGHT HEART CATHETERIZATION WITH Beatrix Fetters;  Surgeon: Sherren Mocha, MD;  Location: [facility] CATH LAB;  Service: Cardiovascular;  Laterality: N/A;  . Percutaneous stent intervention  04/26/2013    Procedure: PERCUTANEOUS STENT INTERVENTION;  Surgeon: Sherren Mocha, MD;  Location: [facility] CATH LAB;  Service: Cardiovascular;;  . Left heart catheterization with coronary angiogram N/A 08/01/2014    Procedure: LEFT HEART CATHETERIZATION WITH CORONARY ANGIOGRAM;  Surgeon: Sinclair Grooms, MD;  Location: [facility] CATH LAB;  Service: Cardiovascular;  Laterality: N/A;  . Right  heart catheterization  10/01/2014    Procedure: RIGHT HEART CATH;  Surgeon: Peter M Martinique, MD;  Location: [facility] CATH LAB;  Service: Cardiovascular;;  . Cardiac catheterization  10/01/2014    Procedure: CORONARY BALLOON ANGIOPLASTY;  Surgeon: Peter M Martinique, MD;  Location: [facility] CATH LAB;  Service: Cardiovascular;;    History   Social History  .  Marital Status: Married    Spouse Name: N/A    Number of Children: N/A  . Years of Education: N/A   Social History Main Topics  . Smoking status: Former Smoker -- 3 years    Types: Pipe  . Smokeless tobacco: Never Used     Comment: "quit smoking pipe in early 1970's"  . Alcohol Use: Yes     Comment: 09/30/2014 "wine w/dinner maybe once/yr"  . Drug Use: No  . Sexual Activity: No   Other Topics Concern  . 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 Has 3 daughters. 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     Family History  Problem Relation Age of Onset  . Heart failure Mother 48  . Heart attack Father   . Leukemia Sister   . Stomach cancer Maternal Grandfather     Review of Systems: As noted in HPI.  All other systems were reviewed and are negative.  Physical Exam: BP 103/59 mmHg  Pulse 71  Ht 5\' 8"  (1.727 m)  Wt 194 lb 8 oz (88.225 kg)  BMI 29.58 kg/m2 Filed Weights   11/27/14 0948  Weight: 194 lb 8 oz (88.225 kg)  GENERAL:  Well appearing, obese WM in NAD HEENT:  PERRL, EOMI, sclera are clear. Oropharynx is clear. NECK:  No jugular venous distention, carotid upstroke brisk and symmetric, no bruits, no thyromegaly or adenopathy LUNGS:  Clear to auscultation bilaterally CHEST:  Unremarkable HEART:  RRR,  PMI not displaced or sustained,S1 and S2 within normal limits, no S3, no S4: no clicks, no rubs, no murmurs ABD:  Soft, obese,  nontender. BS +, no masses or bruits. No hepatomegaly, no splenomegaly EXT:  2 + pulses throughout, no edema,  no cyanosis no clubbing SKIN:  Warm and dry.  No rashes NEURO:  Alert and oriented x 3. Cranial nerves II through XII intact. PSYCH:  Cognitively intact    LABORATORY DATA: Lab Results  Component Value Date   WBC 8.5 10/03/2014   HGB 10.7* 10/03/2014   HCT 31.4* 10/03/2014   PLT 173 10/03/2014   GLUCOSE 159* 10/03/2014   CHOL 90 10/03/2014   TRIG 73 10/03/2014   HDL 38* 10/03/2014   LDLCALC 37 10/03/2014   ALT 26 09/30/2014   AST 24 09/30/2014   NA 142 10/03/2014   K 4.9 10/03/2014   CL 105 10/03/2014   CREATININE 1.00 10/03/2014   BUN 21 10/03/2014   CO2 25 10/03/2014   TSH 0.593 09/23/2014   INR 1.13 09/23/2014   HGBA1C 8.7* 10/03/2014   Ecg: 08/01/14: NSR with first degree AV block and PVCs. Old inferior and anterior infarcts. I have personally reviewed and interpreted this study.  Transthoracic Echocardiography  Patient: Anil, Havard MR #: [id number] Study Date: 04/25/2013 Gender: M Age: 79 Height: 172.7cm Weight: 77.4kg BSA: 1.91m^2 Pt. Status: Room: [facility] SONOGRAPHER Diamond Nickel ORDERING Edmisten, Brooke ATTENDING Virl Axe Md cc:  ------------------------------------------------------------  ------------------------------------------------------------ Indications: Dyspnea 786.09.  ------------------------------------------------------------ History: PMH: Ischemic cardiomyoapthy. Defibrillator. Chronic anemia. Carotid artery disease. Peripheral vascular disease. Congestive heart failure. Risk factors: Diabetes mellitus.  ------------------------------------------------------------ Study Conclusions  - Left ventricle: Extremely poor acoustic windows limit study. Overal LV systolic function appears depress, at least moderately; some images appear severe. Inferior and posterior walls appear severely hypokinetic to akinetic. The cavity size was normal. Wall thickness was normal. Doppler  parameters are  consistent with abnormal left ventricular relaxation (grade 1 diastolic dysfunction). - Mitral valve: Mild regurgitation. - Left atrium: The atrium was moderately dilated. Transthoracic echocardiography. M-mode, complete 2D, spectral Doppler, and color Doppler. Height: Height: 172.7cm. Height: 68in. Weight: Weight: 77.4kg. Weight: 170.3lb. Body mass index: BMI: 25.9kg/m^2. Body surface area: BSA: 1.26m^2. Blood pressure: 153/74. Patient status: Inpatient. Location: Echo laboratory.  ------------------------------------------------------------  ------------------------------------------------------------ Left ventricle: Extremely poor acoustic windows limit study. Overal LV systolic function appears depress, at least moderately; some images appear severe. Inferior and posterior walls appear severely hypokinetic to akinetic. The cavity size was normal. Wall thickness was normal. Doppler parameters are consistent with abnormal left ventricular relaxation (grade 1 diastolic dysfunction).  ------------------------------------------------------------ Aortic valve: Mildly thickened leaflets. Doppler: No regurgitation.  ------------------------------------------------------------ Mitral valve: Mildly thickened leaflets . Doppler: Mild regurgitation. Peak gradient: 47mm Hg (D).  ------------------------------------------------------------ Left atrium: The atrium was moderately dilated.  ------------------------------------------------------------ Right ventricle: RV is not seen well enough to evaluate function. The cavity size was normal. Wall thickness was normal. Pacer wire or catheter noted in right ventricle. Systolic function was normal.  ------------------------------------------------------------ Pulmonic valve: Structurally normal valve. Cusp separation was normal. Doppler: Transvalvular velocity was within the normal range. No  regurgitation.  ------------------------------------------------------------ Tricuspid valve: Poorly visualized. Doppler: Trivial regurgitation.  ------------------------------------------------------------ Right atrium: The atrium was normal in size.  ------------------------------------------------------------ Pericardium: There was no pericardial effusion.  ------------------------------------------------------------  2D measurements Normal Doppler Normal Left ventricle measurements LVID ED, 47.3 mm 43-52 Left ventricle chord, Ea, lat 5.04 cm/ ------- PLAX ann, tiss s LVID ES, 45.7 mm 23-38 DP chord, E/Ea, lat 16.75 ------- PLAX ann, tiss FS, chord, 3 % >29 DP PLAX Ea, med 4.61 cm/ ------- LVPW, ED 10.7 mm ------ ann, tiss s IVS/LVPW 0.84 <1.3 DP ratio, ED E/Ea, med 18.31 ------- Ventricular septum ann, tiss IVS, ED 9.04 mm ------ DP Aorta Mitral valve Root diam, 31 mm ------ Peak E vel 84.4 cm/ ------- ED s Left atrium Peak A vel 44.9 cm/ ------- AP dim 46 mm ------ s AP dim 2.37 cm/m^2 <2.2 Deceleratio 158 ms 150-230 index n time Peak 3 mm ------- gradient, D Hg Peak E/A 1.9 ------- ratio Right ventricle Sa vel, lat 7.13 cm/ ------- ann, tiss s DP  ------------------------------------------------------------ Prepared and Electronically Authenticated by  Dorris Carnes 2014-06-05T17:29:[id number]  Cardiology Nuclear Med Study  CAEDYN RAYGOZA is a 79 y.o. male MRN : [id number] DOB: 19-Dec-1935  Procedure Date: 02/04/2014  Nuclear Med Background  Indication for Stress Test: Evaluation for Ischemia, Graft Patency and Stent Patency  History: CAD, MI, Cath (95% SVG occl. stent, PTCA), hx. VT, Echo 2014, MPI ~20 yrs ago  Cardiac Risk Factors: Family History - CAD, IDDM, Lipids and PVD  Symptoms: Chest Pain, Chest Pain with Exertion (last date of chest discomfort was yesterday) and DOE  Nuclear Pre-Procedure  Caffeine/Decaff Intake: None > 12 hrs  NPO After: 7:00am    Lungs: clear  O2 Sat: 94% on room air.  IV 0.9% NS with Angio Cath: 22g   IV Site: R Forearm x 1, tolerated well  IV Started by: Irven Baltimore, RN   Chest Size (in): 42  Cup Size: n/a   Height: 5\' 8"  (1.727 m)  Weight: 180 lb (81.647 kg)   BMI: Body mass index is 27.38 kg/(m^2).  Tech Comments: Fasting CBG was 148 at 06:30 with 1/2 dose insulin and metformin taken with breakfast. Irven Baltimore, RN.   Nuclear Med Study  1 or 2 day study:  1 day  Stress Test Type: Carlton Adam   Reading MD: N/A  Order Authorizing Provider: Cristopher Peru, MD   Resting Radionuclide: Technetium 40m Sestamibi  Resting Radionuclide Dose: 11.0 mCi   Stress Radionuclide: Technetium 39m Sestamibi  Stress Radionuclide Dose: 33.0 mCi   Stress Protocol  Rest HR: 67  Stress HR: 93   Rest BP: 137/70  Stress BP: 152/72   Exercise Time (min): n/a  METS: n/a    Dose of Adenosine (mg): n/a  Dose of Lexiscan: 0.4 mg   Dose of Atropine (mg): n/a  Dose of Dobutamine: n/a mcg/kg/min (at max HR)   Stress Test Technologist: Glade Asiah, BS-ES  Nuclear Technologist: Charlton Amor, CNMT   Rest Procedure: Myocardial perfusion imaging was performed at rest 45 minutes following the intravenous administration of Technetium 17m Sestamibi.  Rest ECG: Sinus rhythm, first degree AV block, RAD, inferior lateral TWI.  Stress Procedure: The patient received IV Lexiscan 0.4 mg over 15-seconds. Technetium 50m Sestamibi injected at 30-seconds. Quantitative spect images were obtained after a 45 minute delay. During the infusion, the patient complained of stomach tightness, cough and began to dry heave. 75 mg aminophylline was given by Glade Yuriel, EP. He stated he felt completely better by the end of recovery.  Stress ECG: No significant ST segment change suggestive of ischemia.  QPS  Raw Data Images: Acquisition technically good; LVE.  Stress Images: There is decreased uptake in the inferior lateral wall.  Rest Images: There is decreased uptake  in the inferior lateral wall, less prominent compared to the stress images.  Subtraction (SDS): These findings are consistent with prior infarct and mild peri-infarct ischemia.  Transient Ischemic Dilatation (Normal <1.22): 1.29  Lung/Heart Ratio (Normal <0.45): 0.38  Quantitative Gated Spect Images  QGS EDV: 206 ml  QGS ESV: 155 ml  Impression  Exercise Capacity: Lexiscan with no exercise.  BP Response: Normal blood pressure response.  Clinical Symptoms: No chest pain or dyspnea.  ECG Impression: No significant ST segment change suggestive of ischemia.  Comparison with Prior Nuclear Study: No images to compare  Overall Impression: High risk stress nuclear study with a large, severe intensity, partially reversible inferior lateral defect consistent with prior infarct and mild peri-infarct ischemia; possible transient ischemic dilatation of LV cavity.  LV Ejection Fraction: 25%. LV Wall Motion: Akinesis of the inferior lateral wall.  Kirk Ruths    Left Heart Catheterization with Coronary and Bypass Angiography Report  ROEN MACGOWAN  79 y.o.  male  April 25, 1936  Procedure Date: 08/01/2014  Referring Physician: Cristopher Peru, M.D.  Primary Cardiologist:: Cristopher Peru, M.D.  INDICATIONS: Chronic systolic heart failure with class III-IV symptomatology that includes angina. The remaining saphenous vein graft was stented in 2014. The studies being done to rule out restenosis.  PROCEDURE: 1. Left heart catheterization; 2. Coronary angiography; 3. Left ventriculography; 4. Bypass graft angiography  CONSENT:  The risks, benefits, and details of the procedure were explained in detail to the patient. Risks including death, stroke, heart attack, kidney injury, allergy, limb ischemia, bleeding and radiation injury were discussed. The patient verbalized understanding and wanted to proceed. Informed written consent was obtained.  PROCEDURE TECHNIQUE: After Xylocaine anesthesia a 5 French Slender sheath  was placed in the right radial artery with an angiocath and the modified Seldinger technique. Coronary angiography was done using a 5 F JR 4, 3.5 cm JL, and AL-1 diagnostic catheter. Left ventriculography was done using the JR 4 catheter and hand injection.  Hemostasis was achieved with  a radial wrist band after the images were reviewed.  CONTRAST: Total of 140 cc.  COMPLICATIONS: None  HEMODYNAMICS: Aortic pressure 126/62; LV pressure 130/12 mmHg; LVEDP 22 mm mercury  ANGIOGRAPHIC DATA: The left main coronary artery is calcified but patent.  The left anterior descending artery is transapical. The proximal vessel contains eccentric 30-50% stenosis. 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 Proximal to the aneurysm is a first diagonal that contains ostial 85% stenosis. The vessel is moderate in size. The second diagonal is large and free of obstruction. No significant LAD obstruction is noted.  The left circumflex artery is severely diseased. The first marginal is totally occluded. The circumflex then becomes totally occluded after the left atrial recurrent branch arises in the midsegment..  The right coronary artery is totally occluded proximally. Right to right collaterals fill the distal right coronary.Marland Kitchen  BYPASS GRAFT ANGIOGRAPHY: Saphenous vein graft to the circumflex is totally occluded within the previously stented proximal region.  Saphenous vein graft to the right coronary is totally occluded  LEFT VENTRICULOGRAM: Left ventricular angiogram was done in the 30 RAO projection and revealed a dilated left ventricle with severe global hypokinesis and an estimated ejection fraction of 25%.  IMPRESSIONS: 1. Severe native vessel coronary disease with total occlusion of the mid circumflex, total occlusion of the proximal RCA, high-grade obstruction in the first diagonal. This anatomy is not significantly different than that from catheterization in 2014.  2. Bypass graft  disease with total occlusion of both saphenous vein grafts. This includes what appears to be chronic occlusion of the stent within the saphenous vein graft to the circumflex that was placed in 2013.  3. Ischemic cardiomyopathy with chronic systolic heart failure, elevated end-diastolic pressures, and ejection fraction of 25%  RECOMMENDATION: There no interventional options.  Medical therapy appears to be the prudent approach. More aggressive volume removal may be helpful.  The ligament of the chronically occluded right coronary, that is a possibility that CTO intervention could be done. This should be discussed with our team if intensification of anti-ischemic therapy does not help resolve symptoms.Marland Kitchen       CARDIAC CATH NOTE  Name: NICCOLAS LOEPER MRN: [id number] DOB: 06/19/1936  Procedure: Right heart catheterization, PCI with angioplasty of chronic total occlusion of the RCA  Indication: 79 yo WM with history of CAD s/p remote CABG with known occlusion of the LCx and RCA. He also has a history of ischemic cardiomyopathy with EF of 25% and CHF. He presents with refractory class 4 dyspnea despite optimal medical therapy. Right heart cath was performed to assess hemodynamics. The RCA was occluded with right to left and left to right collaterals and was suitable for CTO PCI.  Right heart cath: the right groin was prepped, draped, and anesthetized with 1% lidocaine. Using a modified Seldinger technique, a 7 Fr sheath was introduced into the right femoral vein. A Swan Ganz catheter was used for recording hemodynamics and obtaining measurements of oxygen saturations and cardiac output.   Findings: Pressures: RA: 7/6 mean 3 mm Hg RV: 37/7 mm Hg PA: 36/13 mean 22 mm Hg PCWP: 9/10 mean 8 mm Hg AO: 133/44 mean 75 mm Hg  Saturations: PA: 65% AO: 94%  Cardiac output 5.1 L/min Cardiac index: 2.63 L/min/meter squared  Finding that hemodynamics were satisfactory we decided to proceed with planned PCI  of RCA CTO  Procedural Details: Both groins were prepped, draped, and anesthetized with 1% lidocaine. Using the modified Seldinger technique,  an 8 Fr long sheath was introduced into the right femoral artery. Likewise a 6 Fr sheath was introduced into the left femoral artery. Weight-based heparin was given for anticoagulation. Once a therapeutic ACT was achieved, an 8 Fr LA 0.75 guide catheter was inserted via the right femoral access. A 6 Fr left Judkins catheter was used to visualize the collaterals from the LCA. Angiography demonstrated a 90% stenosis in the proximal RCA. This was followed by a 99% stenosis in the mid RCA after the takeoff of a high marginal branch. There was total occlusion in the mid RCA. Initially a Fielder XT coronary guidewire was used to cross the first 2 lesions and penetrated the proximal cap on the total occlusion. We attempted to get better backup support with a Corsair and Turnpike twist catheters but neither catheter was able to cross the second lesion. We then used a 1.2 mm balloon to dilate the first 2 lesions. We were eventually able to cross the CTO with a Piliot 200 wire but the wire position was lost when trying to cross with a Tornus device. The Tornus device also could not cross the second lesion due to angulation and heavy calcification. The CTO was recrossed with a Fielder XT wire positioned in a large RV marginal branch. The lesions were then crossed with a Fine cross catheter and the Ramond Marrow was exchanged for a Mailman wire. Using this wire we performed progressive balloon dilitations of all three lesions with a 1.2 mm, 1.5 mm, and 2.0 mm balloons. There was still severe wasting of the second lesion and this was diltated with a 2.25 noncompliant balloon with good expansion. At this point we were unable to cross with either a larger balloon or a stent again due the second lesion. The RCA was patent with dissection extending from the proximal to mid vessel at the  bifurcation of the distal RCA and the RV marginal branch. There was marked improvement in antegrade flow TIMI 3. Given his extensive dissection he was not a candidate for rotational atherectomy at this time. We ended the procedure at this point with flow reestablished in the native vessel. The patient tolerated the procedure well. There were no immediate procedural complications. Femoral hemostasis was achieved with bilateral Angioseal devices. The venous sheath will be removed in 2 hours. The patient was transferred to the post catheterization recovery area for further monitoring.  Lesion Data: Vessel: RCA CTO Percent stenosis (pre): 100% TIMI-flow (pre): 0% PTCA only Percent stenosis (post): 70% with extensive dissection. TIMI-flow (post): 3  Conclusions:  1. Normal right heart pressures. Low PCWP due to diuresis. 2. Successful CTO PCI of the native RCA. Restoration of TIMI 3 antegrade flow.  Recommendations: Continue DAPT. Will monitor patient's symptomatic response. If he continues to have significant angina we may consider reassessing the RCA in 4-8 weeks. Once it has had a chance to heal he may be a candidate for rotational atherectomy and stenting. There is a reasonable chance that the vessel may heal with a satisfactory result.   Peter Martinique, [place]  10/01/2014, 7:00 PM   Assessment / Plan: 1. CAD s/p CABG. Occlusion of SVG to the OM. Now s/p CTO PCI of the native RCA. Result was not optimal due to severe calcification and dissection but TIMI 3 flow was restored. He has experienced a significant improvement in his dyspnea. Unfortunately his fatigue has persisted. He is having orthostatic symptoms. Will stop isosorbide. Reduce Zbeta to 2.5 mg daily and follow. At this point  I do not recommend repeat cardiac cath since his dyspnea symptoms are so much better. If his symptoms recur he may be a candidate for Rotational atherectomy of the RCA. I will follow up in 3 months.  2. Chronic  systolic CHF. Continue ACEi, beta blocker. Adjust dose as above.   3. Carotid arterial disease  4. Hyperlipidemia.  5. DM type 2.   6. S/p ICD- followed by Dr. Lovena Le.

## 2014-11-27 NOTE — Patient Instructions (Signed)
Reduce Zbeta (bisoprolol) to 2.5 mg daily  Stop Imdur (isosorbide)  Continue your other therapy  I will see you in 3 months

## 2014-12-05 ENCOUNTER — Other Ambulatory Visit: Payer: Self-pay | Admitting: *Deleted

## 2014-12-05 DIAGNOSIS — I255 Ischemic cardiomyopathy: Secondary | ICD-10-CM

## 2014-12-05 MED ORDER — BISOPROLOL FUMARATE 5 MG PO TABS: 2.5000 mg | ORAL_TABLET | Freq: Every day | ORAL | Status: DC

## 2014-12-23 ENCOUNTER — Encounter: Payer: Self-pay | Admitting: Internal Medicine

## 2014-12-23 ENCOUNTER — Ambulatory Visit (INDEPENDENT_AMBULATORY_CARE_PROVIDER_SITE_OTHER): Payer: Medicare Other | Admitting: Internal Medicine

## 2014-12-23 VITALS — BP 108/60 | HR 65 | Ht 68.0 in | Wt 190.4 lb

## 2014-12-23 DIAGNOSIS — Z95810 Presence of automatic (implantable) cardiac defibrillator: Secondary | ICD-10-CM

## 2014-12-23 DIAGNOSIS — I5022 Chronic systolic (congestive) heart failure: Secondary | ICD-10-CM | POA: Diagnosis not present

## 2014-12-23 DIAGNOSIS — I472 Ventricular tachycardia: Secondary | ICD-10-CM

## 2014-12-23 DIAGNOSIS — I4729 Other ventricular tachycardia: Secondary | ICD-10-CM

## 2014-12-23 DIAGNOSIS — I10 Essential (primary) hypertension: Secondary | ICD-10-CM

## 2014-12-23 DIAGNOSIS — I255 Ischemic cardiomyopathy: Secondary | ICD-10-CM

## 2014-12-23 LAB — MDC_IDC_ENUM_SESS_TYPE_INCLINIC
Brady Statistic RV Percent Paced: 1 % — CL
Date Time Interrogation Session: 20160202050000
HIGH POWER IMPEDANCE MEASURED VALUE: 36 Ohm
HighPow Impedance: 60 Ohm
Implantable Pulse Generator Serial Number: 118170
Lead Channel Impedance Value: 557 Ohm
Lead Channel Pacing Threshold Amplitude: 1 V
Lead Channel Pacing Threshold Pulse Width: 0.4 ms
Lead Channel Sensing Intrinsic Amplitude: 14.8 mV
Lead Channel Setting Pacing Pulse Width: 0.4 ms
MDC IDC SET LEADCHNL RV PACING AMPLITUDE: 2.4 V
MDC IDC SET LEADCHNL RV SENSING SENSITIVITY: 0.4 mV
MDC IDC SET ZONE DETECTION INTERVAL: 308 ms
Zone Setting Detection Interval: 273 ms
Zone Setting Detection Interval: 364 ms

## 2014-12-23 MED ORDER — POTASSIUM CHLORIDE 20 MEQ PO PACK: 20.0000 meq | PACK | Freq: Every day | ORAL | Status: DC

## 2014-12-23 MED ORDER — FUROSEMIDE 40 MG PO TABS: ORAL_TABLET | ORAL | Status: DC

## 2014-12-23 NOTE — Assessment & Plan Note (Signed)
He has no increased sob. He never has chest pressure. No change in his anginal symptoms.

## 2014-12-23 NOTE — Progress Notes (Signed)
HPI Mr. Halbig returns today for followup. He is a pleasant 79 yo man with an ICM, chronic systolic heart failure, VT, s/p ICD implant. In the past several months, the patient has had worsening sob and episodes of chest pressure. He underwent stress testing several months ago and had a high risk scan with both inferolateral scar and ischemia. He had undergone PCI of a 90% SVG to OM3 lesion approximately 18 months ago. He underwent PCI of a chronic total occlusion by Dr. Martinique several months ago. Previously he has been reluctant to avoid sodium and he still admits to sodium indiscretion although he is a bit better on this. He also drinks water in excess. He has had trouble with beta blockers as well as other anti-anginals. When I last saw the patient, I started bisoprolol and Imdur. He appears to be tolerating his bystolic but has stopped Imdur, I think because of hypotension. It appears that shortness of breath is his anginal equivalent. He never had angina with his MI years ago. Allergies  Allergen Reactions  . Coreg [Carvedilol] Other (See Comments)    Shortness of breath ,fatigue ,dizzyness   . Lovastatin Other (See Comments)    CK elevation, Myalgias     Current Outpatient Prescriptions  Medication Sig Dispense Refill  . aspirin (ASPIR-81) 81 MG EC tablet Take 81 mg by mouth at bedtime.     . bisoprolol (ZEBETA) 5 MG tablet Take 0.5 tablets (2.5 mg total) by mouth daily. 45 tablet 3  . clopidogrel (PLAVIX) 75 MG tablet TAKE 1 TABLET EVERY DAY WITH BREAKFAST 90 tablet 3  . etodolac (LODINE) 400 MG tablet Take 400 mg by mouth daily.     . furosemide (LASIX) 40 MG tablet Take one tablet by mouth twice daily 180 tablet 3  . HYDROcodone-acetaminophen (LORCET) 5-325 MG per tablet Take 1 tablet by mouth every 6 (six) hours as needed for pain.    Marland Kitchen insulin aspart protamine- aspart (NOVOLOG MIX 70/30) (70-30) 100 UNIT/ML injection Inject 12-20 Units into the skin 2 (two) times daily with a  meal. Inject 20 units in the morning and 12 units in the evening    . lisinopril (PRINIVIL,ZESTRIL) 2.5 MG tablet Take 1 tablet (2.5 mg total) by mouth daily. 90 tablet 2  . metFORMIN (GLUCOPHAGE) 1000 MG tablet Take 1,000 mg by mouth 2 (two) times daily with a meal.    . nitroGLYCERIN (NITROSTAT) 0.4 MG SL tablet Place 0.4 mg under the tongue every 5 (five) minutes as needed for chest pain.    . potassium chloride (KLOR-CON) 20 MEQ packet Take 20 mEq by mouth daily. 90 tablet 3  . pravastatin (PRAVACHOL) 40 MG tablet Take 40 mg by mouth at bedtime.     . pregabalin (LYRICA) 75 MG capsule Take 75 mg by mouth daily.     . vitamin B-12 (CYANOCOBALAMIN) 1000 MCG tablet Take 1,000 mcg by mouth daily.     No current facility-administered medications for this visit.     Past Medical History  Diagnosis Date  . Occlusion and stenosis of carotid artery without mention of cerebral infarction   . Raynaud's syndrome   . Paroxysmal ventricular tachycardia   . Peripheral vascular disease   . Cardiomyopathy, ischemic     a. s/p ICD placement  . Automatic implantable cardioverter-defibrillator in situ   . HLD (hyperlipidemia)   . GERD (gastroesophageal reflux disease)   . Chronic lower back pain   . Coronary artery disease  a. s/p CABG 1990  b. s/p PCI of CTO of RCA 10/05/14  . Hypertension   . Type II diabetes mellitus   . Arthritis   . TIA (transient ischemic attack)     a. s/p LHC on 10/01/14     ROS:   All systems reviewed and negative except as noted in the HPI.   Past Surgical History  Procedure Laterality Date  . Anal fistulectomy  2000's  . Cataract extraction, bilateral Bilateral ~ 2009  . Cardiac defibrillator placement  02/1989-12/2011    "I've had a total of 6 or 7 defibrillators put in" (09/30/2014)  . Finger fracture surgery Left ~ 2000    "pointer"  . Coronary artery bypass graft  01/1989    "CABG X3"   . Fracture surgery    . Coronary angioplasty with stent placement   04/2013    "1"  . Cardiac catheterization  07/2014  . Coronary angioplasty  10/01/14    PTVA to CTO with restoration of TIMI 3  . Implantable cardioverter defibrillator (icd) generator change Left 10/05/2011    Procedure: ICD GENERATOR CHANGE;  Surgeon: Evans Lance, MD;  Location: [facility] CATH LAB;  Service: Cardiovascular;  Laterality: Left;  . Left and right heart catheterization with coronary/graft angiogram N/A 04/26/2013    Procedure: LEFT AND RIGHT HEART CATHETERIZATION WITH Beatrix Fetters;  Surgeon: Sherren Mocha, MD;  Location: [facility] CATH LAB;  Service: Cardiovascular;  Laterality: N/A;  . Percutaneous stent intervention  04/26/2013    Procedure: PERCUTANEOUS STENT INTERVENTION;  Surgeon: Sherren Mocha, MD;  Location: [facility] CATH LAB;  Service: Cardiovascular;;  . Left heart catheterization with coronary angiogram N/A 08/01/2014    Procedure: LEFT HEART CATHETERIZATION WITH CORONARY ANGIOGRAM;  Surgeon: Sinclair Grooms, MD;  Location: [facility] CATH LAB;  Service: Cardiovascular;  Laterality: N/A;  . Right heart catheterization  10/01/2014    Procedure: RIGHT HEART CATH;  Surgeon: Peter M Martinique, MD;  Location: [facility] CATH LAB;  Service: Cardiovascular;;  . Cardiac catheterization  10/01/2014    Procedure: CORONARY BALLOON ANGIOPLASTY;  Surgeon: Peter M Martinique, MD;  Location: [facility] CATH LAB;  Service: Cardiovascular;;     Family History  Problem Relation Age of Onset  . Heart failure Mother 39  . Heart attack Father   . Leukemia Sister   . Stomach cancer Maternal Grandfather      History   Social History  . Marital Status: Married    Spouse Name: N/A    Number of Children: N/A  . Years of Education: N/A   Occupational History  . Not on file.   Social History Main Topics  . Smoking status: Former Smoker -- 3 years    Types: Pipe  . Smokeless tobacco: Never Used     Comment: "quit smoking pipe in early 1970's"  . Alcohol Use: Yes     Comment: 09/30/2014 "wine w/dinner maybe  once/yr"  . Drug Use: No  . Sexual Activity: No   Other Topics Concern  . 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      BP 108/60 mmHg  Pulse 65  Ht 5\' 8"  (1.727 m)  Wt 190 lb 6.4 oz (86.365 kg)  BMI 28.96 kg/m2  Physical Exam:  Stable but chronically ill appearing NAD HEENT: Unremarkable Neck:  No JVD,  no thyromegally Back:  No CVA tenderness Lungs:  Clear except for basilar rales. HEART:  Regular rate rhythm, no murmurs, no rubs, no clicks, soft S4. Abd:  soft, positive bowel sounds, no organomegally, no rebound, no guarding Ext:  2 plus pulses, no edema, no cyanosis, no clubbing Skin:  No rashes no nodules Neuro:  CN II through XII intact, motor grossly intact   Assess/Plan:

## 2014-12-23 NOTE — Patient Instructions (Addendum)
  Your physician wants you to follow-up in: 6 months with Dr. Knox Saliva will receive a reminder letter in the mail two months in advance. If you don't receive a letter, please call our office to schedule the follow-up appointment.  Your physician has recommended you make the following change in your medication:  1) Decrease Furosemide to 1 tablet twice daily

## 2014-12-23 NOTE — Assessment & Plan Note (Signed)
He has had non-sustained but no sustained VT. No change in meds.

## 2014-12-23 NOTE — Assessment & Plan Note (Signed)
He feels more weak than dyspneic. I have recommended he cut back on his lasix. He is instructed to weigh himself. If his weight goes up on lass lasix, he will increase the lasix as needed.

## 2014-12-23 NOTE — Assessment & Plan Note (Signed)
His [place] ICD is working normally. Will recheck in several months.

## 2014-12-26 ENCOUNTER — Encounter: Payer: Self-pay | Admitting: Internal Medicine

## 2014-12-31 DIAGNOSIS — I739 Peripheral vascular disease, unspecified: Secondary | ICD-10-CM | POA: Diagnosis not present

## 2015-02-11 DIAGNOSIS — E1149 Type 2 diabetes mellitus with other diabetic neurological complication: Secondary | ICD-10-CM | POA: Diagnosis not present

## 2015-02-11 DIAGNOSIS — R238 Other skin changes: Secondary | ICD-10-CM | POA: Diagnosis not present

## 2015-02-11 DIAGNOSIS — Z1389 Encounter for screening for other disorder: Secondary | ICD-10-CM | POA: Diagnosis not present

## 2015-02-11 DIAGNOSIS — Z6829 Body mass index (BMI) 29.0-29.9, adult: Secondary | ICD-10-CM | POA: Diagnosis not present

## 2015-02-11 DIAGNOSIS — S51009A Unspecified open wound of unspecified elbow, initial encounter: Secondary | ICD-10-CM | POA: Diagnosis not present

## 2015-02-11 DIAGNOSIS — T148 Other injury of unspecified body region: Secondary | ICD-10-CM | POA: Diagnosis not present

## 2015-02-12 DIAGNOSIS — E119 Type 2 diabetes mellitus without complications: Secondary | ICD-10-CM | POA: Diagnosis not present

## 2015-02-18 DIAGNOSIS — M5030 Other cervical disc degeneration, unspecified cervical region: Secondary | ICD-10-CM | POA: Diagnosis not present

## 2015-02-24 ENCOUNTER — Other Ambulatory Visit: Payer: Self-pay | Admitting: Orthopedic Surgery

## 2015-02-24 DIAGNOSIS — M545 Low back pain: Secondary | ICD-10-CM

## 2015-03-03 ENCOUNTER — Encounter (HOSPITAL_BASED_OUTPATIENT_CLINIC_OR_DEPARTMENT_OTHER): Payer: Medicare Other | Attending: General Surgery

## 2015-03-03 DIAGNOSIS — Z8614 Personal history of Methicillin resistant Staphylococcus aureus infection: Secondary | ICD-10-CM | POA: Diagnosis not present

## 2015-03-03 DIAGNOSIS — E11622 Type 2 diabetes mellitus with other skin ulcer: Secondary | ICD-10-CM | POA: Insufficient documentation

## 2015-03-03 DIAGNOSIS — L89020 Pressure ulcer of left elbow, unstageable: Secondary | ICD-10-CM | POA: Diagnosis not present

## 2015-03-03 DIAGNOSIS — Z794 Long term (current) use of insulin: Secondary | ICD-10-CM | POA: Insufficient documentation

## 2015-03-03 DIAGNOSIS — L89010 Pressure ulcer of right elbow, unstageable: Secondary | ICD-10-CM | POA: Diagnosis not present

## 2015-03-04 ENCOUNTER — Ambulatory Visit
Admission: RE | Admit: 2015-03-04 | Discharge: 2015-03-04 | Disposition: A | Discharge status: Home / Self Care | Payer: Medicare Other | Source: Ambulatory Visit | Attending: Orthopedic Surgery | Admitting: Orthopedic Surgery

## 2015-03-04 DIAGNOSIS — M4806 Spinal stenosis, lumbar region: Secondary | ICD-10-CM | POA: Diagnosis not present

## 2015-03-04 DIAGNOSIS — M545 Low back pain: Secondary | ICD-10-CM

## 2015-03-04 DIAGNOSIS — M5126 Other intervertebral disc displacement, lumbar region: Secondary | ICD-10-CM | POA: Diagnosis not present

## 2015-03-04 DIAGNOSIS — M47816 Spondylosis without myelopathy or radiculopathy, lumbar region: Secondary | ICD-10-CM | POA: Diagnosis not present

## 2015-03-10 DIAGNOSIS — E11622 Type 2 diabetes mellitus with other skin ulcer: Secondary | ICD-10-CM | POA: Diagnosis not present

## 2015-03-10 DIAGNOSIS — Z8614 Personal history of Methicillin resistant Staphylococcus aureus infection: Secondary | ICD-10-CM | POA: Diagnosis not present

## 2015-03-10 DIAGNOSIS — Z794 Long term (current) use of insulin: Secondary | ICD-10-CM | POA: Diagnosis not present

## 2015-03-10 DIAGNOSIS — L89010 Pressure ulcer of right elbow, unstageable: Secondary | ICD-10-CM | POA: Diagnosis not present

## 2015-03-10 DIAGNOSIS — L89020 Pressure ulcer of left elbow, unstageable: Secondary | ICD-10-CM | POA: Diagnosis not present

## 2015-03-11 ENCOUNTER — Emergency Department (HOSPITAL_COMMUNITY): Payer: Medicare Other

## 2015-03-11 ENCOUNTER — Emergency Department (HOSPITAL_COMMUNITY)
Admission: EM | Admit: 2015-03-11 | Discharge: 2015-03-11 | Disposition: A | Discharge status: Home / Self Care | Payer: Medicare Other | Attending: Emergency Medicine | Admitting: Emergency Medicine

## 2015-03-11 ENCOUNTER — Encounter (HOSPITAL_COMMUNITY): Payer: Self-pay | Admitting: Family Medicine

## 2015-03-11 DIAGNOSIS — Z8673 Personal history of transient ischemic attack (TIA), and cerebral infarction without residual deficits: Secondary | ICD-10-CM | POA: Diagnosis not present

## 2015-03-11 DIAGNOSIS — Z9889 Other specified postprocedural states: Secondary | ICD-10-CM | POA: Insufficient documentation

## 2015-03-11 DIAGNOSIS — R918 Other nonspecific abnormal finding of lung field: Secondary | ICD-10-CM | POA: Insufficient documentation

## 2015-03-11 DIAGNOSIS — I10 Essential (primary) hypertension: Secondary | ICD-10-CM | POA: Insufficient documentation

## 2015-03-11 DIAGNOSIS — Z794 Long term (current) use of insulin: Secondary | ICD-10-CM | POA: Diagnosis not present

## 2015-03-11 DIAGNOSIS — E119 Type 2 diabetes mellitus without complications: Secondary | ICD-10-CM | POA: Diagnosis not present

## 2015-03-11 DIAGNOSIS — Z7982 Long term (current) use of aspirin: Secondary | ICD-10-CM | POA: Diagnosis not present

## 2015-03-11 DIAGNOSIS — I2510 Atherosclerotic heart disease of native coronary artery without angina pectoris: Secondary | ICD-10-CM | POA: Diagnosis not present

## 2015-03-11 DIAGNOSIS — E785 Hyperlipidemia, unspecified: Secondary | ICD-10-CM | POA: Insufficient documentation

## 2015-03-11 DIAGNOSIS — Z95810 Presence of automatic (implantable) cardiac defibrillator: Secondary | ICD-10-CM | POA: Diagnosis not present

## 2015-03-11 DIAGNOSIS — Z951 Presence of aortocoronary bypass graft: Secondary | ICD-10-CM | POA: Insufficient documentation

## 2015-03-11 DIAGNOSIS — R911 Solitary pulmonary nodule: Secondary | ICD-10-CM | POA: Diagnosis not present

## 2015-03-11 DIAGNOSIS — M1990 Unspecified osteoarthritis, unspecified site: Secondary | ICD-10-CM | POA: Insufficient documentation

## 2015-03-11 DIAGNOSIS — Z8719 Personal history of other diseases of the digestive system: Secondary | ICD-10-CM | POA: Diagnosis not present

## 2015-03-11 DIAGNOSIS — Z79899 Other long term (current) drug therapy: Secondary | ICD-10-CM | POA: Insufficient documentation

## 2015-03-11 DIAGNOSIS — G8929 Other chronic pain: Secondary | ICD-10-CM | POA: Insufficient documentation

## 2015-03-11 DIAGNOSIS — R0602 Shortness of breath: Secondary | ICD-10-CM | POA: Insufficient documentation

## 2015-03-11 DIAGNOSIS — J9811 Atelectasis: Secondary | ICD-10-CM | POA: Diagnosis not present

## 2015-03-11 DIAGNOSIS — Z7902 Long term (current) use of antithrombotics/antiplatelets: Secondary | ICD-10-CM | POA: Insufficient documentation

## 2015-03-11 DIAGNOSIS — M7989 Other specified soft tissue disorders: Secondary | ICD-10-CM | POA: Diagnosis not present

## 2015-03-11 DIAGNOSIS — Z87891 Personal history of nicotine dependence: Secondary | ICD-10-CM | POA: Insufficient documentation

## 2015-03-11 DIAGNOSIS — I517 Cardiomegaly: Secondary | ICD-10-CM | POA: Diagnosis not present

## 2015-03-11 LAB — I-STAT TROPONIN, ED
Troponin i, poc: 0.01 ng/mL (ref 0.00–0.08)
Troponin i, poc: 0 ng/mL (ref 0.00–0.08)

## 2015-03-11 LAB — CBC
HCT: 32 % — ABNORMAL LOW (ref 39.0–52.0)
Hemoglobin: 10.6 g/dL — ABNORMAL LOW (ref 13.0–17.0)
MCH: 29.9 pg (ref 26.0–34.0)
MCHC: 33.1 g/dL (ref 30.0–36.0)
MCV: 90.1 fL (ref 78.0–100.0)
PLATELETS: 176 10*3/uL (ref 150–400)
RBC: 3.55 MIL/uL — ABNORMAL LOW (ref 4.22–5.81)
RDW: 15.3 % (ref 11.5–15.5)
WBC: 9.3 10*3/uL (ref 4.0–10.5)

## 2015-03-11 LAB — BASIC METABOLIC PANEL
Anion gap: 13 (ref 5–15)
BUN: 35 mg/dL — AB (ref 6–23)
CO2: 29 mmol/L (ref 19–32)
Calcium: 9 mg/dL (ref 8.4–10.5)
Chloride: 100 mmol/L (ref 96–112)
Creatinine, Ser: 1.59 mg/dL — ABNORMAL HIGH (ref 0.50–1.35)
GFR, EST AFRICAN AMERICAN: 47 mL/min — AB (ref >90)
GFR, EST NON AFRICAN AMERICAN: 40 mL/min — AB (ref >90)
Glucose, Bld: 133 mg/dL — ABNORMAL HIGH (ref 70–99)
Potassium: 3.8 mmol/L (ref 3.5–5.1)
Sodium: 142 mmol/L (ref 135–145)

## 2015-03-11 LAB — I-STAT CG4 LACTIC ACID, ED
LACTIC ACID, VENOUS: 1.63 mmol/L (ref 0.5–2.0)
Lactic Acid, Venous: 1.34 mmol/L (ref 0.5–2.0)

## 2015-03-11 LAB — CBG MONITORING, ED: Glucose-Capillary: 94 mg/dL (ref 70–99)

## 2015-03-11 LAB — D-DIMER, QUANTITATIVE (NOT AT ARMC): D DIMER QUANT: 0.95 ug{FEU}/mL — AB (ref 0.00–0.48)

## 2015-03-11 LAB — BRAIN NATRIURETIC PEPTIDE: B Natriuretic Peptide: 472.9 pg/mL — ABNORMAL HIGH (ref 0.0–100.0)

## 2015-03-11 MED ORDER — SODIUM CHLORIDE 0.9 % IV SOLN
INTRAVENOUS | Status: DC
  Administered 2015-03-11: 11:00:00 via INTRAVENOUS

## 2015-03-11 MED ORDER — IOHEXOL 350 MG/ML SOLN
100.0000 mL | Freq: Once | INTRAVENOUS | Status: AC | PRN
  Administered 2015-03-11: 100 mL via INTRAVENOUS

## 2015-03-11 NOTE — ED Provider Notes (Addendum)
CSN: [id number]     Arrival date & time 03/11/15  0865 History   First MD Initiated Contact with Patient 03/11/15 [phone]     Chief Complaint  Patient presents with  . Shortness of Breath     (Consider location/radiation/quality/duration/timing/severity/associated sxs/prior Treatment) Patient is a 79 y.o. male presenting with shortness of breath. The history is provided by the patient and the spouse.  Shortness of Breath Associated symptoms: no abdominal pain, no chest pain, no fever, no headaches, no neck pain, no rash and no vomiting    patient with known history of congestive heart failure. Patient with shortness of breath it's been getting worse quickly in the last 2 weeks. No chest pain also with increasing leg swelling. Patient concerned that his congestive heart failure has gotten worse. Patient followed by [facility] cardiology. Patient is already on Lasix 40 mg twice a day but has increased at 80 mg twice a day due to the increased swelling and shortness of breath.  Past Medical History  Diagnosis Date  . Occlusion and stenosis of carotid artery without mention of cerebral infarction   . Raynaud's syndrome   . Paroxysmal ventricular tachycardia   . Peripheral vascular disease   . Cardiomyopathy, ischemic     a. s/p ICD placement  . Automatic implantable cardioverter-defibrillator in situ   . HLD (hyperlipidemia)   . GERD (gastroesophageal reflux disease)   . Chronic lower back pain   . Coronary artery disease     a. s/p CABG 1990  b. s/p PCI of CTO of RCA 10/05/14  . Hypertension   . Type II diabetes mellitus   . Arthritis   . TIA (transient ischemic attack)     a. s/p LHC on 10/01/14    Past Surgical History  Procedure Laterality Date  . Anal fistulectomy  2000's  . Cataract extraction, bilateral Bilateral ~ 2009  . Cardiac defibrillator placement  02/1989-12/2011    "I've had a total of 6 or 7 defibrillators put in" (09/30/2014)  . Finger fracture surgery Left ~ 2000     "pointer"  . Coronary artery bypass graft  01/1989    "CABG X3"   . Fracture surgery    . Coronary angioplasty with stent placement  04/2013    "1"  . Cardiac catheterization  07/2014  . Coronary angioplasty  10/01/14    PTVA to CTO with restoration of TIMI 3  . Implantable cardioverter defibrillator (icd) generator change Left 10/05/2011    Procedure: ICD GENERATOR CHANGE;  Surgeon: Evans Lance, MD;  Location: [facility] CATH LAB;  Service: Cardiovascular;  Laterality: Left;  . Left and right heart catheterization with coronary/graft angiogram N/A 04/26/2013    Procedure: LEFT AND RIGHT HEART CATHETERIZATION WITH Beatrix Fetters;  Surgeon: Sherren Mocha, MD;  Location: [facility] CATH LAB;  Service: Cardiovascular;  Laterality: N/A;  . Percutaneous stent intervention  04/26/2013    Procedure: PERCUTANEOUS STENT INTERVENTION;  Surgeon: Sherren Mocha, MD;  Location: [facility] CATH LAB;  Service: Cardiovascular;;  . Left heart catheterization with coronary angiogram N/A 08/01/2014    Procedure: LEFT HEART CATHETERIZATION WITH CORONARY ANGIOGRAM;  Surgeon: Sinclair Grooms, MD;  Location: [facility] CATH LAB;  Service: Cardiovascular;  Laterality: N/A;  . Right heart catheterization  10/01/2014    Procedure: RIGHT HEART CATH;  Surgeon: Peter M Martinique, MD;  Location: [facility] CATH LAB;  Service: Cardiovascular;;  . Cardiac catheterization  10/01/2014    Procedure: CORONARY BALLOON ANGIOPLASTY;  Surgeon: Peter M Martinique, MD;  Location: [facility] CATH LAB;  Service: Cardiovascular;;   Family History  Problem Relation Age of Onset  . Heart failure Mother 31  . Heart attack Father   . Leukemia Sister   . Stomach cancer Maternal Grandfather    History  Substance Use Topics  . Smoking status: Former Smoker -- 3 years    Types: Pipe  . Smokeless tobacco: Never Used     Comment: "quit smoking pipe in early 1970's"  . Alcohol Use: Yes     Comment: 09/30/2014 "wine w/dinner maybe once/yr"    Review of Systems  Constitutional:  Negative for fever.  HENT: Negative for congestion.   Eyes: Negative for visual disturbance.  Respiratory: Positive for shortness of breath.   Cardiovascular: Positive for leg swelling. Negative for chest pain.  Gastrointestinal: Negative for nausea, vomiting and abdominal pain.  Genitourinary: Negative for dysuria.  Musculoskeletal: Negative for back pain and neck pain.  Skin: Negative for rash.  Neurological: Negative for headaches.  Hematological: Does not bruise/bleed easily.  Psychiatric/Behavioral: Negative for confusion.      Allergies  Coreg and Lovastatin  Home Medications   Prior to Admission medications   Medication Sig Start Date End Date Taking? Authorizing Provider  aspirin (ASPIR-81) 81 MG EC tablet Take 81 mg by mouth at bedtime.    Yes Historical Provider, MD  bisoprolol (ZEBETA) 5 MG tablet Take 0.5 tablets (2.5 mg total) by mouth daily. 12/05/14  Yes Peter M Martinique, MD  clopidogrel (PLAVIX) 75 MG tablet TAKE 1 TABLET EVERY DAY WITH BREAKFAST 09/29/14  Yes Evans Lance, MD  dexamethasone (DECADRON) 4 MG tablet Take 4 mg by mouth daily as needed. 02/18/15  Yes Historical Provider, MD  etodolac (LODINE) 400 MG tablet Take 400 mg by mouth daily.    Yes Historical Provider, MD  furosemide (LASIX) 40 MG tablet Take one tablet by mouth twice daily 12/23/14  Yes Evans Lance, MD  HYDROcodone-acetaminophen [facility]) 10-325 MG per tablet Take 1 tablet by mouth every 8 (eight) hours as needed. 01/21/15  Yes Historical Provider, MD  Insulin Glargine (TOUJEO SOLOSTAR Yancey) Inject 30 Units into the skin every morning.   Yes Historical Provider, MD  isosorbide mononitrate (IMDUR) 30 MG 24 hr tablet Take 15 mg by mouth daily. 01/11/15  Yes Historical Provider, MD  LYRICA 150 MG capsule Take 150 mg by mouth 2 (two) times daily. 02/03/15  Yes Historical Provider, MD  metFORMIN (GLUCOPHAGE) 1000 MG tablet Take 1,000 mg by mouth 2 (two) times daily with a meal.   Yes Historical Provider, MD   nitroGLYCERIN (NITROSTAT) 0.4 MG SL tablet Place 0.4 mg under the tongue every 5 (five) minutes as needed for chest pain.   Yes Historical Provider, MD  Oxycodone HCl 10 MG TABS Take 10 mg by mouth every 8 (eight) hours as needed (moderate pain).  02/18/15  Yes Historical Provider, MD  potassium chloride (KLOR-CON) 20 MEQ packet Take 20 mEq by mouth daily. 12/23/14  Yes Evans Lance, MD  pravastatin (PRAVACHOL) 40 MG tablet Take 40 mg by mouth at bedtime.    Yes Historical Provider, MD  SANTYL ointment Apply 1 application topically every other day. 03/04/15  Yes Historical Provider, MD  vitamin B-12 (CYANOCOBALAMIN) 1000 MCG tablet Take 1,000 mcg by mouth daily.   Yes Historical Provider, MD  lisinopril (PRINIVIL,ZESTRIL) 2.5 MG tablet Take 1 tablet (2.5 mg total) by mouth daily. Patient not taking: Reported on 03/11/2015 10/20/14   Isaiah Serge, NP  BP 143/57 mmHg  Pulse 71  Temp(Src) 97.6 F (36.4 C) (Oral)  Resp 14  Wt 190 lb 9.6 oz (86.456 kg)  SpO2 89% Physical Exam  Constitutional: He is oriented to person, place, and time. He appears well-developed and well-nourished. No distress.  HENT:  Head: Normocephalic and atraumatic.  Eyes: Conjunctivae and EOM are normal. Pupils are equal, round, and reactive to light.  Neck: Normal range of motion.  Cardiovascular: Normal rate, regular rhythm and normal heart sounds.   No murmur heard. Pulmonary/Chest: Effort normal and breath sounds normal. No respiratory distress. He has no wheezes. He has no rales.  Abdominal: Soft. Bowel sounds are normal. There is no tenderness.  Musculoskeletal: He exhibits edema. He exhibits no tenderness.  Neurological: He is alert and oriented to person, place, and time. No cranial nerve deficit. He exhibits normal muscle tone. Coordination normal.  Skin: Skin is warm. No rash noted.  Nursing note and vitals reviewed.   ED Course  Procedures (including critical care time) Labs Review Labs Reviewed  CBC -  Abnormal; Notable for the following:    RBC 3.55 (*)    Hemoglobin 10.6 (*)    HCT 32.0 (*)    All other components within normal limits  BASIC METABOLIC PANEL - Abnormal; Notable for the following:    Glucose, Bld 133 (*)    BUN 35 (*)    Creatinine, Ser 1.59 (*)    GFR calc non Af Amer 40 (*)    GFR calc Af Amer 47 (*)    All other components within normal limits  BRAIN NATRIURETIC PEPTIDE - Abnormal; Notable for the following:    B Natriuretic Peptide 472.9 (*)    All other components within normal limits  D-DIMER, QUANTITATIVE - Abnormal; Notable for the following:    D-Dimer, Quant 0.95 (*)    All other components within normal limits  I-STAT TROPOININ, ED  I-STAT CG4 LACTIC ACID, ED  I-STAT TROPOININ, ED  I-STAT CG4 LACTIC ACID, ED  CBG MONITORING, ED   Results for orders placed or performed during the hospital encounter of 03/11/15  CBC  Result Value Ref Range   WBC 9.3 4.0 - 10.5 K/uL   RBC 3.55 (L) 4.22 - 5.81 MIL/uL   Hemoglobin 10.6 (L) 13.0 - 17.0 g/dL   HCT 32.0 (L) 39.0 - 52.0 %   MCV 90.1 78.0 - 100.0 fL   MCH 29.9 26.0 - 34.0 pg   MCHC 33.1 30.0 - 36.0 g/dL   RDW 15.3 11.5 - 15.5 %   Platelets 176 150 - 400 K/uL  Basic metabolic panel  Result Value Ref Range   Sodium 142 135 - 145 mmol/L   Potassium 3.8 3.5 - 5.1 mmol/L   Chloride 100 96 - 112 mmol/L   CO2 29 19 - 32 mmol/L   Glucose, Bld 133 (H) 70 - 99 mg/dL   BUN 35 (H) 6 - 23 mg/dL   Creatinine, Ser 1.59 (H) 0.50 - 1.35 mg/dL   Calcium 9.0 8.4 - 10.5 mg/dL   GFR calc non Af Amer 40 (L) >90 mL/min   GFR calc Af Amer 47 (L) >90 mL/min   Anion gap 13 5 - 15  BNP (order ONLY if patient complains of dyspnea/SOB AND you have documented it for THIS visit)  Result Value Ref Range   B Natriuretic Peptide 472.9 (H) 0.0 - 100.0 pg/mL  D-dimer, quantitative  Result Value Ref Range   D-Dimer, Quant 0.95 (H) 0.00 -  0.48 ug/mL-FEU  I-stat troponin, ED (not at [facility])  Result Value Ref Range   Troponin i, poc  0.01 0.00 - 0.08 ng/mL   Comment 3          I-Stat CG4 Lactic Acid, ED  Result Value Ref Range   Lactic Acid, Venous 1.34 0.5 - 2.0 mmol/L  I-Stat Troponin, ED (not at [facility])  Result Value Ref Range   Troponin i, poc 0.00 0.00 - 0.08 ng/mL   Comment 3          I-Stat CG4 Lactic Acid, ED  Result Value Ref Range   Lactic Acid, Venous 1.63 0.5 - 2.0 mmol/L  CBG monitoring, ED  Result Value Ref Range   Glucose-Capillary 94 70 - 99 mg/dL     Imaging Review Ct Chest Wo Contrast  03/11/2015   CLINICAL DATA:  79 year old male with shortness of breath and bilateral lower extremity swelling. Initial encounter.  EXAM: CT CHEST WITHOUT CONTRAST  TECHNIQUE: Multidetector CT imaging of the chest was performed following the standard protocol without IV contrast.  COMPARISON:  Portable chest radiographs 0910 hours today and earlier. Lumbar spine CT 03/04/2015.  FINDINGS: Left chest cardiac AICD. Major airways are patent. Cardiomegaly. Extensive aortic and coronary artery calcified plaque. No pericardial effusion. No mediastinal or hilar lymphadenopathy identified.  No axillary lymphadenopathy. Negative thoracic inlet aside from proximal great vessel calcified atherosclerosis.  Epicardial pacer leads partially visible in the epigastrium tracking to the ventral left abdominal wall.  Cholelithiasis. No pericholecystic fluid. Negative visible non contrast liver, spleen, pancreas, adrenal glands, kidneys, and bowel in the upper abdomen.  Major airways are patent. No pleural effusion. No consolidation. Numerous small bilateral pulmonary nodules in the periphery of both lungs, up to 5 mm in size. Some of these are measured and annotated on series 205. Mild thickening and scarring associated with the confluence of the right major and minor fissures best seen on coronal image 207. Mild mosaic attenuation is evident in the lungs on coronal image 269.  No acute osseous abnormality identified.  Prior median sternotomy.   IMPRESSION: 1. Multiple small bilateral pulmonary nodules which likely are postinflammatory. Follow-up chest CT at 1 year is recommended. This recommendation follows the consensus statement: Guidelines for Management of Small Pulmonary Nodules Detected on CT Scans: A Statement from the [employer] as published in Radiology 2005; 237:395-400. 2. Mild mosaic attenuation in the lungs which could reflect chronic pulmonary small airway or small vessel disease. 3. Cardiomegaly with severe calcified atherosclerosis. No pericardial or pleural effusion. 4. Cholelithiasis.   Electronically Signed   By: Genevie Ann M.D.   On: 03/11/2015 10:35   Dg Chest Port 1 View  03/11/2015   CLINICAL DATA:  Short of breath.  EXAM: PORTABLE CHEST - 1 VIEW  COMPARISON:  09/30/2014  FINDINGS: Prior CABG.  AICD unchanged.  Negative for heart failure.  Hypoventilation with mild bibasilar atelectasis. Negative for effusion.  IMPRESSION: Hypoventilation with mild bibasilar atelectasis.   Electronically Signed   By: Franchot Gallo M.D.   On: 03/11/2015 09:26     EKG Interpretation   Date/Time:  Wednesday March 11 2015 08:46:23 EDT Ventricular Rate:  71 PR Interval:  269 QRS Duration: 98 QT Interval:  399 QTC Calculation: 434 R Axis:   43 Text Interpretation:  Sinus arrhythmia Prolonged PR interval Low voltage,  extremity and precordial leads Consider anterior infarct Confirmed by  Shantoya Geurts  MD, Caylor Tallarico ([id number]) on 03/11/2015 4:13:20 PM      MDM  Final diagnoses:  SOB (shortness of breath)    Patient known to have significant congestive heart failure. Presented with the complaint of shortness of breath and bilateral leg swelling. Patient followed by the [facility] cardiology last saw them in the fall. Extensive workup here expecting to find the pulmonary edema but not present. Chest x-ray negative for pneumonia and pulmonary edema CT scan done also negative for any subtle pulmonary edema. Troponins 2 negative. D-dimer  eventually done was elevated CT angiogram ordered for this reason. If negative patient can be discharged home with follow-up with cardiology. Patient's oxygen sats have been mid 90s are better and patient also with no significant shortness of breath while here.    Fredia Sorrow, MD 03/11/15    Addendum the CT scan not showing a pulmonary nodule. This will require follow-up by his primary care Dr. patient aware.  Fredia Sorrow, MD 03/11/15 [phone]

## 2015-03-11 NOTE — ED Notes (Signed)
Pt is in stable condition upon d/c and is escorted from ED by staff.

## 2015-03-11 NOTE — ED Notes (Signed)
Pt here for SOB and BLE swelling. Denies chest pain. sts he has been taking nitro through the night.

## 2015-03-11 NOTE — ED Notes (Signed)
Patient transported to CT 

## 2015-03-11 NOTE — Discharge Instructions (Signed)
Follow-up with cardiology.  Make an appointment. Workup here today negative. CT of chest and CT angiogram to show some pulmonary nodules which will require close follow-up. Follow-up with your regular doctor for that. Return for any new or worse symptoms. Definitely return for development of any worsening shortness of breath. Continue your current medications.

## 2015-03-12 ENCOUNTER — Telehealth: Payer: Self-pay | Admitting: Cardiology

## 2015-03-12 NOTE — Telephone Encounter (Signed)
Returned call to patient's wife no answer.Unable to leave message no voice mail.

## 2015-03-12 NOTE — Telephone Encounter (Signed)
Pt's wife called in stating that her husband is just getting out of the hosp and needs to be seen sooner than 5/10 because he is having a lot of breathing problems. Please f/u  Thanks

## 2015-03-12 NOTE — Telephone Encounter (Signed)
Called. No answer or VM pickup.

## 2015-03-13 DIAGNOSIS — R791 Abnormal coagulation profile: Secondary | ICD-10-CM | POA: Diagnosis not present

## 2015-03-13 DIAGNOSIS — R0602 Shortness of breath: Secondary | ICD-10-CM | POA: Diagnosis not present

## 2015-03-13 DIAGNOSIS — R918 Other nonspecific abnormal finding of lung field: Secondary | ICD-10-CM | POA: Diagnosis not present

## 2015-03-13 DIAGNOSIS — R600 Localized edema: Secondary | ICD-10-CM | POA: Diagnosis not present

## 2015-03-13 NOTE — Telephone Encounter (Signed)
Returned call to patient's wife no answer.Left message to call back on her cell # [phone] and home # [phone].

## 2015-03-18 ENCOUNTER — Encounter (HOSPITAL_COMMUNITY): Payer: Self-pay | Admitting: General Practice

## 2015-03-18 ENCOUNTER — Telehealth: Payer: Self-pay | Admitting: Cardiology

## 2015-03-18 ENCOUNTER — Inpatient Hospital Stay (HOSPITAL_COMMUNITY)
Admission: AD | Admit: 2015-03-18 | Discharge: 2015-03-22 | DRG: 287 | Disposition: A | Discharge status: Home / Self Care | Payer: Medicare Other | Source: Ambulatory Visit | Attending: Cardiology | Admitting: Cardiology

## 2015-03-18 ENCOUNTER — Ambulatory Visit (INDEPENDENT_AMBULATORY_CARE_PROVIDER_SITE_OTHER): Payer: Medicare Other | Admitting: Cardiology

## 2015-03-18 ENCOUNTER — Encounter: Payer: Self-pay | Admitting: Cardiology

## 2015-03-18 VITALS — BP 113/56 | HR 96 | Ht 68.0 in | Wt 196.8 lb

## 2015-03-18 DIAGNOSIS — R0600 Dyspnea, unspecified: Secondary | ICD-10-CM | POA: Diagnosis not present

## 2015-03-18 DIAGNOSIS — Z7982 Long term (current) use of aspirin: Secondary | ICD-10-CM

## 2015-03-18 DIAGNOSIS — I2582 Chronic total occlusion of coronary artery: Secondary | ICD-10-CM | POA: Diagnosis present

## 2015-03-18 DIAGNOSIS — I25118 Atherosclerotic heart disease of native coronary artery with other forms of angina pectoris: Secondary | ICD-10-CM | POA: Diagnosis present

## 2015-03-18 DIAGNOSIS — Z955 Presence of coronary angioplasty implant and graft: Secondary | ICD-10-CM | POA: Diagnosis not present

## 2015-03-18 DIAGNOSIS — I25119 Atherosclerotic heart disease of native coronary artery with unspecified angina pectoris: Secondary | ICD-10-CM | POA: Diagnosis not present

## 2015-03-18 DIAGNOSIS — I509 Heart failure, unspecified: Secondary | ICD-10-CM | POA: Diagnosis not present

## 2015-03-18 DIAGNOSIS — I255 Ischemic cardiomyopathy: Secondary | ICD-10-CM | POA: Diagnosis present

## 2015-03-18 DIAGNOSIS — D649 Anemia, unspecified: Secondary | ICD-10-CM | POA: Diagnosis present

## 2015-03-18 DIAGNOSIS — I5022 Chronic systolic (congestive) heart failure: Secondary | ICD-10-CM

## 2015-03-18 DIAGNOSIS — Z79899 Other long term (current) drug therapy: Secondary | ICD-10-CM

## 2015-03-18 DIAGNOSIS — Z951 Presence of aortocoronary bypass graft: Secondary | ICD-10-CM | POA: Diagnosis present

## 2015-03-18 DIAGNOSIS — I472 Ventricular tachycardia: Secondary | ICD-10-CM | POA: Diagnosis not present

## 2015-03-18 DIAGNOSIS — N179 Acute kidney failure, unspecified: Secondary | ICD-10-CM | POA: Diagnosis present

## 2015-03-18 DIAGNOSIS — I10 Essential (primary) hypertension: Secondary | ICD-10-CM | POA: Diagnosis present

## 2015-03-18 DIAGNOSIS — E669 Obesity, unspecified: Secondary | ICD-10-CM | POA: Diagnosis present

## 2015-03-18 DIAGNOSIS — R0602 Shortness of breath: Secondary | ICD-10-CM

## 2015-03-18 DIAGNOSIS — I272 Other secondary pulmonary hypertension: Secondary | ICD-10-CM | POA: Diagnosis present

## 2015-03-18 DIAGNOSIS — I5041 Acute combined systolic (congestive) and diastolic (congestive) heart failure: Secondary | ICD-10-CM

## 2015-03-18 DIAGNOSIS — Z7902 Long term (current) use of antithrombotics/antiplatelets: Secondary | ICD-10-CM | POA: Diagnosis not present

## 2015-03-18 DIAGNOSIS — Z87891 Personal history of nicotine dependence: Secondary | ICD-10-CM

## 2015-03-18 DIAGNOSIS — I2584 Coronary atherosclerosis due to calcified coronary lesion: Secondary | ICD-10-CM | POA: Diagnosis present

## 2015-03-18 DIAGNOSIS — I25718 Atherosclerosis of autologous vein coronary artery bypass graft(s) with other forms of angina pectoris: Secondary | ICD-10-CM | POA: Diagnosis present

## 2015-03-18 DIAGNOSIS — N183 Chronic kidney disease, stage 3 (moderate): Secondary | ICD-10-CM | POA: Diagnosis present

## 2015-03-18 DIAGNOSIS — E785 Hyperlipidemia, unspecified: Secondary | ICD-10-CM | POA: Diagnosis present

## 2015-03-18 DIAGNOSIS — Z95810 Presence of automatic (implantable) cardiac defibrillator: Secondary | ICD-10-CM | POA: Diagnosis not present

## 2015-03-18 DIAGNOSIS — I5043 Acute on chronic combined systolic (congestive) and diastolic (congestive) heart failure: Principal | ICD-10-CM | POA: Diagnosis present

## 2015-03-18 DIAGNOSIS — D509 Iron deficiency anemia, unspecified: Secondary | ICD-10-CM | POA: Diagnosis present

## 2015-03-18 DIAGNOSIS — E119 Type 2 diabetes mellitus without complications: Secondary | ICD-10-CM | POA: Diagnosis present

## 2015-03-18 DIAGNOSIS — I517 Cardiomegaly: Secondary | ICD-10-CM | POA: Diagnosis not present

## 2015-03-18 DIAGNOSIS — I129 Hypertensive chronic kidney disease with stage 1 through stage 4 chronic kidney disease, or unspecified chronic kidney disease: Secondary | ICD-10-CM | POA: Diagnosis present

## 2015-03-18 DIAGNOSIS — E1129 Type 2 diabetes mellitus with other diabetic kidney complication: Secondary | ICD-10-CM

## 2015-03-18 DIAGNOSIS — J811 Chronic pulmonary edema: Secondary | ICD-10-CM | POA: Diagnosis not present

## 2015-03-18 DIAGNOSIS — I2510 Atherosclerotic heart disease of native coronary artery without angina pectoris: Secondary | ICD-10-CM | POA: Diagnosis not present

## 2015-03-18 LAB — CBC WITH DIFFERENTIAL/PLATELET
Basophils Absolute: 0 10*3/uL (ref 0.0–0.1)
Basophils Relative: 0 % (ref 0–1)
EOS PCT: 1 % (ref 0–5)
Eosinophils Absolute: 0.1 10*3/uL (ref 0.0–0.7)
HCT: 32.1 % — ABNORMAL LOW (ref 39.0–52.0)
Hemoglobin: 10.4 g/dL — ABNORMAL LOW (ref 13.0–17.0)
LYMPHS ABS: 1.8 10*3/uL (ref 0.7–4.0)
LYMPHS PCT: 17 % (ref 12–46)
MCH: 28.8 pg (ref 26.0–34.0)
MCHC: 32.4 g/dL (ref 30.0–36.0)
MCV: 88.9 fL (ref 78.0–100.0)
MONO ABS: 0.7 10*3/uL (ref 0.1–1.0)
MONOS PCT: 6 % (ref 3–12)
Neutro Abs: 8 10*3/uL — ABNORMAL HIGH (ref 1.7–7.7)
Neutrophils Relative %: 76 % (ref 43–77)
PLATELETS: 172 10*3/uL (ref 150–400)
RBC: 3.61 MIL/uL — ABNORMAL LOW (ref 4.22–5.81)
RDW: 15.5 % (ref 11.5–15.5)
WBC: 10.6 10*3/uL — ABNORMAL HIGH (ref 4.0–10.5)

## 2015-03-18 LAB — COMPREHENSIVE METABOLIC PANEL
ALT: 22 U/L (ref 0–53)
ANION GAP: 8 (ref 5–15)
AST: 23 U/L (ref 0–37)
Albumin: 3.6 g/dL (ref 3.5–5.2)
Alkaline Phosphatase: 59 U/L (ref 39–117)
BUN: 43 mg/dL — ABNORMAL HIGH (ref 6–23)
CALCIUM: 8.8 mg/dL (ref 8.4–10.5)
CO2: 28 mmol/L (ref 19–32)
Chloride: 103 mmol/L (ref 96–112)
Creatinine, Ser: 1.59 mg/dL — ABNORMAL HIGH (ref 0.50–1.35)
GFR, EST AFRICAN AMERICAN: 47 mL/min — AB (ref >90)
GFR, EST NON AFRICAN AMERICAN: 40 mL/min — AB (ref >90)
GLUCOSE: 90 mg/dL (ref 70–99)
POTASSIUM: 4.1 mmol/L (ref 3.5–5.1)
SODIUM: 139 mmol/L (ref 135–145)
TOTAL PROTEIN: 6 g/dL (ref 6.0–8.3)
Total Bilirubin: 0.7 mg/dL (ref 0.3–1.2)

## 2015-03-18 LAB — GLUCOSE, CAPILLARY
GLUCOSE-CAPILLARY: 78 mg/dL (ref 70–99)
GLUCOSE-CAPILLARY: 91 mg/dL (ref 70–99)
Glucose-Capillary: 78 mg/dL (ref 70–99)
Glucose-Capillary: 58 mg/dL — ABNORMAL LOW (ref 70–99)
Glucose-Capillary: 59 mg/dL — ABNORMAL LOW (ref 70–99)

## 2015-03-18 MED ORDER — NITROGLYCERIN 0.4 MG SL SUBL
0.4000 mg | SUBLINGUAL_TABLET | SUBLINGUAL | Status: DC | PRN
  Filled 2015-03-18: qty 1

## 2015-03-18 MED ORDER — ASPIRIN 81 MG PO CHEW
81.0000 mg | CHEWABLE_TABLET | Freq: Every day | ORAL | Status: DC
  Administered 2015-03-18 – 2015-03-21 (×4): 81 mg via ORAL
  Filled 2015-03-18 (×3): qty 1

## 2015-03-18 MED ORDER — HEPARIN SODIUM (PORCINE) 5000 UNIT/ML IJ SOLN
5000.0000 [IU] | Freq: Three times a day (TID) | INTRAMUSCULAR | Status: DC
  Administered 2015-03-18 – 2015-03-20 (×6): 5000 [IU] via SUBCUTANEOUS
  Filled 2015-03-18 (×9): qty 1

## 2015-03-18 MED ORDER — LISINOPRIL 2.5 MG PO TABS
2.5000 mg | ORAL_TABLET | Freq: Every day | ORAL | Status: DC
  Administered 2015-03-19 – 2015-03-22 (×4): 2.5 mg via ORAL
  Filled 2015-03-18 (×4): qty 1

## 2015-03-18 MED ORDER — PREGABALIN 50 MG PO CAPS
150.0000 mg | ORAL_CAPSULE | Freq: Two times a day (BID) | ORAL | Status: DC
  Administered 2015-03-18 – 2015-03-22 (×8): 150 mg via ORAL
  Filled 2015-03-18: qty 2
  Filled 2015-03-18: qty 1
  Filled 2015-03-18 (×3): qty 2
  Filled 2015-03-18: qty 1
  Filled 2015-03-18: qty 2
  Filled 2015-03-18 (×4): qty 1
  Filled 2015-03-18: qty 2

## 2015-03-18 MED ORDER — FUROSEMIDE 10 MG/ML IJ SOLN
INTRAMUSCULAR | Status: AC
  Filled 2015-03-18: qty 4

## 2015-03-18 MED ORDER — CLOPIDOGREL BISULFATE 75 MG PO TABS
75.0000 mg | ORAL_TABLET | Freq: Every day | ORAL | Status: DC
  Administered 2015-03-19 – 2015-03-20 (×2): 75 mg via ORAL
  Filled 2015-03-18 (×2): qty 1

## 2015-03-18 MED ORDER — SODIUM CHLORIDE 0.9 % IV SOLN: 250.0000 mL | INTRAVENOUS | Status: DC | PRN

## 2015-03-18 MED ORDER — ISOSORBIDE MONONITRATE ER 30 MG PO TB24
15.0000 mg | ORAL_TABLET | Freq: Every day | ORAL | Status: DC
  Administered 2015-03-19 – 2015-03-22 (×4): 15 mg via ORAL
  Filled 2015-03-18 (×4): qty 1

## 2015-03-18 MED ORDER — FUROSEMIDE 10 MG/ML IJ SOLN
40.0000 mg | Freq: Once | INTRAMUSCULAR | Status: AC
  Administered 2015-03-18: 40 mg via INTRAVENOUS

## 2015-03-18 MED ORDER — BISOPROLOL FUMARATE 5 MG PO TABS
2.5000 mg | ORAL_TABLET | Freq: Every day | ORAL | Status: DC
  Administered 2015-03-19 – 2015-03-22 (×4): 2.5 mg via ORAL
  Filled 2015-03-18 (×2): qty 0.5
  Filled 2015-03-18: qty 1
  Filled 2015-03-18: qty 0.5

## 2015-03-18 MED ORDER — ONDANSETRON HCL 4 MG/2ML IJ SOLN
4.0000 mg | Freq: Four times a day (QID) | INTRAMUSCULAR | Status: DC | PRN
  Administered 2015-03-18: 4 mg via INTRAVENOUS
  Filled 2015-03-18: qty 2

## 2015-03-18 MED ORDER — ACETAMINOPHEN 325 MG PO TABS: 650.0000 mg | ORAL_TABLET | ORAL | Status: DC | PRN

## 2015-03-18 MED ORDER — SODIUM CHLORIDE 0.9 % IJ SOLN: 3.0000 mL | INTRAMUSCULAR | Status: DC | PRN

## 2015-03-18 MED ORDER — PRAVASTATIN SODIUM 40 MG PO TABS
40.0000 mg | ORAL_TABLET | Freq: Every day | ORAL | Status: DC
  Administered 2015-03-18 – 2015-03-21 (×4): 40 mg via ORAL
  Filled 2015-03-18 (×6): qty 1

## 2015-03-18 MED ORDER — SODIUM CHLORIDE 0.9 % IJ SOLN
3.0000 mL | Freq: Two times a day (BID) | INTRAMUSCULAR | Status: DC
  Administered 2015-03-18 – 2015-03-21 (×7): 3 mL via INTRAVENOUS

## 2015-03-18 NOTE — Telephone Encounter (Signed)
Returned call to patient's wife she stated she would like husband to be seen today.Stated he is sob hard to walk from room to room.Increase swelling in both lower legs and feet.Stated he woke up last night with pain in back of head and neck.Stated he was confused wanting to go feed horses in middle of night.Stated he went to ER last week with sob.Appointment scheduled with Lyda Jester PA this morning at [facility] office 10:30 am.

## 2015-03-18 NOTE — H&P (Signed)
03/18/2015 Victor [name]  08-12-1936  [id number]  Primary Physician Leonides Sake, MD Primary Cardiologist: Dr. Martinique  Reason for Visit/CC: Dyspnea and LEE  HPI: The patient is a 79 y/o male followed by Dr. Martinique. He has a history of chronic ischemic heart disease. He is s/p CABG x 2 in 1990 at [facility]. In June 2014 he presented with unstable angina. At that time the SVG to the RCA was occluded. There was a CTO of the native RCA. The SVG to OM had a severe stenosis and this was successfully stented. In September 2015 he presented with progressive angina and abnormal stress test. Cardiac cath at that time showed occlusion of the SVG to the OM. The OM had minimal collateral flow. He continued to have class 3-4 angina. On 10/01/14 he underwent CTO PCI of the RCA. This was a difficult procedure due to severe calcification of the vessel. Dr. Martinique was able to cross and expand the vessel with a 2.0 mm balloon with resultant dissection but restoration of TIMI 3 flow.  He also has a history of chronic systolic CHF and is s/p ICD placement. Last 2D echo 09/2014 demonstrated an EF of 35% + G1DD. He also has a history of DM type 2, VT, PAD with carotid disease, hyperlipidemia, cervical disc disease and obesity.   He is seen in clinic today with complaints of 1 week h/o progressive bilateral LEE, abdominal fullness + nausea, orthopnea and PND. No resting dyspnea but notes mild DOE. No chest pain. Denies syncope/ near syncope. He reports full medication compliance but not fully adherent to a low sodium diet (goes out to eat a lot). He has made attempts to increase his home lasix to 40 mg BID w/o improvement. He feels his UOP has slowed. He was seen in the [facility] ER 03/11/15 with same complaints. BNP was elevated at 427. Chest x-ray negative for pneumonia and pulmonary edema. Troponins 2 negative. D-dimer was elevated but CT angiogram showed no PE. BMP demonstrated elevated SCr at 1.59 (baseline ~1.0). He was  discharged home from ED and instructed to f/u in our office.   Today in clinic, he notes continue symptoms despite increase in his diuretic. He is CP free currently but EKG shows SR with 1st degree AVB and new Twave abnormalites concerning for lateral ischemia (new compared to prior EKG 03/11/15). HR is 60 bpm. O2 sats are 96% on RA. BP is 113/56.    Current Outpatient Prescriptions  Medication Sig Dispense Refill  . aspirin (ASPIR-81) 81 MG EC tablet Take 81 mg by mouth at bedtime.     . bisoprolol (ZEBETA) 5 MG tablet Take 0.5 tablets (2.5 mg total) by mouth daily. 45 tablet 3  . clopidogrel (PLAVIX) 75 MG tablet TAKE 1 TABLET EVERY DAY WITH BREAKFAST 90 tablet 3  . dexamethasone (DECADRON) 4 MG tablet Take 4 mg by mouth daily as needed.  0  . etodolac (LODINE) 400 MG tablet Take 400 mg by mouth daily.     . furosemide (LASIX) 40 MG tablet Take one tablet by mouth twice daily (Patient taking differently: Take two tablet by mouth twice daily) 180 tablet 3  . HYDROcodone-acetaminophen (NORCO) 10-325 MG per tablet Take 1 tablet by mouth every 8 (eight) hours as needed.  0  . Insulin Glargine (TOUJEO SOLOSTAR [place]) Inject 30 Units into the skin every morning.    . isosorbide mononitrate (IMDUR) 30 MG 24 hr tablet Take 15 mg by mouth daily.  6  .  lisinopril (PRINIVIL,ZESTRIL) 2.5 MG tablet Take 1 tablet (2.5 mg total) by mouth daily. 90 tablet 2  . LYRICA 150 MG capsule Take 150 mg by mouth 2 (two) times daily.  1  . metFORMIN (GLUCOPHAGE) 1000 MG tablet Take 1,000 mg by mouth 2 (two) times daily with a meal.    . nitroGLYCERIN (NITROSTAT) 0.4 MG SL tablet Place 0.4 mg under the tongue every 5 (five) minutes as needed for chest pain.    . Oxycodone HCl 10 MG TABS Take 10 mg by mouth every 8 (eight) hours as needed (moderate pain).   0  . potassium chloride (KLOR-CON) 20 MEQ packet Take 20 mEq by mouth daily. 90 tablet  3  . pravastatin (PRAVACHOL) 40 MG tablet Take 40 mg by mouth at bedtime.     Marland Kitchen SANTYL ointment Apply 1 application topically every other day.  2  . vitamin B-12 (CYANOCOBALAMIN) 1000 MCG tablet Take 1,000 mcg by mouth daily.     No current facility-administered medications for this visit.    Allergies  Allergen Reactions  . Coreg [Carvedilol] Other (See Comments)    Shortness of breath ,fatigue ,dizzyness   . Lovastatin Other (See Comments)    CK elevation, Myalgias  . Ativan [Lorazepam] Other (See Comments)    Pt. Had side effects    History   Social History  . Marital Status: Married    Spouse Name: N/A  . Number of Children: N/A  . Years of Education: N/A   Occupational History  . Not on file.   Social History Main Topics  . Smoking status: Former Smoker -- 3 years    Types: Pipe  . Smokeless tobacco: Never Used     Comment: "quit smoking pipe in early 1970's"  . Alcohol Use: Yes     Comment: 09/30/2014 "wine w/dinner maybe once/yr"  . Drug Use: No  . Sexual Activity: No   Other Topics Concern  . 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      Review of Systems: General: negative for chills, fever, night sweats or weight changes.  Cardiovascular: negative for chest pain, dyspnea on exertion, edema, orthopnea, palpitations, paroxysmal nocturnal dyspnea or shortness of breath Dermatological: negative for rash Respiratory: negative for cough or wheezing Urologic: negative for hematuria Abdominal: negative for nausea, vomiting, diarrhea, bright red blood per rectum, melena, or hematemesis Neurologic: negative for visual changes, syncope, or dizziness All other systems reviewed and are otherwise negative  except as noted above.    Blood pressure 113/56, pulse 96, height 5\' 8"  (1.727 m), weight 196 lb 12.8 oz (89.268 kg).  General appearance: alert, cooperative and no distress Neck: no carotid bruit and no JVD Lungs: faint basilar rales, bilateral diffuse rhonchi Heart: regular rate and rhythm, S1, S2 normal, no murmur, click, rub or gallop Extremities: 2+ bilateral pitting edema Pulses: 2+ and symmetric Skin: warm and dry Neurologic: Grossly normal  EKG SR with 1st Degree AVB. HR 60 bpm Lateral Twave abnormalities. New compared to prior EKG 03/11/15.   ASSESSMENT AND PLAN:   1. Acute On Chronic Combined Systolic + Diastolic CHF: known LV dysfunction with EF of 35% on most recent echo 09/2014. Has ICD. Now with progressive bilateral LE pitting edema, orthopnea, PND and DOE. He has failed PO diuretics at home, despite increasing to  40 mg BID. Will admit to [facility] for IV diuretic therapy. Continue BB and ACE-I. Daily weights, low sodium diet, strict I/Os and daily BMP to follow renal function and electrolytes. Recheck 2D echo.   2. CAD: denies CP but with progressive dyspnea (?anginal equivalent). Also with new lateral Twave abnormalities suggestive of ischemia (New compared to EKG 1 week ago). He will likely require repeat R/LHC this admission. Continue ASA, Plavix, BB, ACE and statin.   3. Acute Renal Insufficiency: recent BMP obtained in [facility] ED 1 week ago showed increase in Scr to 1.56 (baseline ~1.0). Patient has doubled PO home diuretic over the last week. Will repeat BMP and will follow closely.   PLAN Patient was seen and examined by Dr. Cathie Olden, office DOD. He agrees with the above assessment and plan. Will admit to [facility] for management of a/c combined systolic + diastolic CHF and for ischemia w/u.   Arvilla Market 03/18/2015 11:46 AM  Attending Note:   The patient was seen and examined.  Agree with assessment and plan as noted above.  Changes made to the above note as  needed.  The patient presents with symptoms of CHF.  Has progressive leg edema that is not responding to PO diurectic.  In addition his angina equivilent is dyspnea.  He may be having unstable angina . He will need to receive iv lasix for several day  - he has significant orthopnea . Likely will need cath in several days.   Thayer Headings, Brooke Bonito., MD, [facility] 03/18/2015, 6:57 PM [address]  [place] Pager [phone]

## 2015-03-18 NOTE — Progress Notes (Signed)
CRITICAL VALUE ALERT  Critical value received:  58 CBG  Date of notification:     Time of notification:     Critical value read back:Yes.    Nurse who received alert:  Darlin Drop  MD notified (1st page):  n/a  Time of first page:  n/a  MD notified (2nd page):  Time of second page:  Responding MD:  n/a  Time MD responded:  N/a   Patient CBG was 58. Gave 2 cups orange juice and [place] sandwich.  CBG now 91.  Will make q4h CBG to keep eye on it.

## 2015-03-18 NOTE — Progress Notes (Signed)
Still no orders for pt. Trish paged again, at 1330 returned call, need to page Katie,PA. Katie paged.  At [place] returned call, unavailable to give orders, asked to have Trished called back for someone else.  At [place] called back and Brittainy, PA had just placed orders on pt.

## 2015-03-18 NOTE — Progress Notes (Signed)
Notation of pt's pain in posterior neck that has been ongoing for some time, he has been followed up with his orthopedic MD, Dr Eulas Post. Pt has been having spontaneous intermittent pain to his posterior neck that radiates up thru the back of his head and radiates over to his right shoulder, wife states there are times she can see a knot come up on his right shoulder, it will turn red at times when he has these episodes of pain.   Pt had a CT scan of these areas last week at [facility] pt and wife state, ordered by Dr Eulas Post, he is suppose to follow up with Dr Eulas Post tomorrow for the results and plans.   The pain comes on spontaneously, as did this afternoon,  pt gets very restless when pain occurs and he will stand up and move around the room, hold his posterior neck and back of head, then will sit down on the bed and squeeze/hug a pillow for a bit and then the pain will ease up after a few minutes. Which did so at this time. Pt uses hydrocodone prn pain at home. Denies needing any at this time.

## 2015-03-18 NOTE — Telephone Encounter (Signed)
Pt's wife called in stating that the pt is having some breathing problems along with not being able to move around as good as he use to, having trouble sleeping at night , along with confusion at times. She would like to get the pt in to see Dr. Martinique sooner if possible. Please f/u  Thanks

## 2015-03-18 NOTE — Progress Notes (Signed)
03/18/2015 Victor [name]   1936/09/05  [id number]  Primary Physician Leonides Sake, MD Primary Cardiologist: Dr. Martinique  Reason for Visit/CC:  Dyspnea and LEE  HPI:  The patient is a 79 y/o male followed by Dr. Martinique. He has a history of chronic ischemic heart disease. He is s/p CABG x 2 in 1990 at [facility]. In June 2014 he presented with unstable angina. At that time the SVG to the RCA was occluded. There was a CTO of the native RCA. The SVG to OM had a severe stenosis and this was successfully stented. In September 2015 he presented with progressive angina and abnormal stress test. Cardiac cath at that time showed occlusion of the SVG to the OM. The OM had minimal collateral flow. He continued to have class 3-4 angina. On 10/01/14 he underwent CTO PCI of the RCA. This was a difficult procedure due to severe calcification of the vessel. Dr. Martinique was able to cross and expand the vessel with a 2.0 mm balloon with resultant dissection but restoration of TIMI 3 flow.  He also has a history of chronic systolic CHF and is s/p ICD placement. Last 2D echo 09/2014 demonstrated an EF of 35% + G1DD. He also has a history of DM type 2, VT, PAD with carotid disease, hyperlipidemia, cervical disc disease and obesity.   He is seen in clinic today with complaints of 1 week h/o progressive bilateral LEE, abdominal fullness + nausea, orthopnea and PND. No resting dyspnea but notes mild DOE. No chest pain. Denies syncope/ near syncope. He reports full medication compliance but not fully adherent to a low sodium diet (goes out to eat a lot). He has made attempts to increase his home lasix to 40 mg BID w/o improvement. He feels his UOP has slowed. He was seen in the [facility] ER 03/11/15 with same complaints. BNP was elevated at 427. Chest x-ray negative for pneumonia and pulmonary edema. Troponins 2 negative. D-dimer was elevated but CT angiogram showed no PE.  BMP demonstrated elevated SCr at 1.59 (baseline ~1.0). He was  discharged home from ED and instructed to f/u in our office.   Today in clinic, he notes continue symptoms despite increase in his diuretic. He is CP free currently but EKG shows SR with 1st degree AVB and new Twave abnormalites concerning for lateral ischemia (new compared to prior EKG 03/11/15). HR is 60 bpm. O2 sats are 96% on RA. BP is  113/56.     Current Outpatient Prescriptions  Medication Sig Dispense Refill  . aspirin (ASPIR-81) 81 MG EC tablet Take 81 mg by mouth at bedtime.     . bisoprolol (ZEBETA) 5 MG tablet Take 0.5 tablets (2.5 mg total) by mouth daily. 45 tablet 3  . clopidogrel (PLAVIX) 75 MG tablet TAKE 1 TABLET EVERY DAY WITH BREAKFAST 90 tablet 3  . dexamethasone (DECADRON) 4 MG tablet Take 4 mg by mouth daily as needed.  0  . etodolac (LODINE) 400 MG tablet Take 400 mg by mouth daily.     . furosemide (LASIX) 40 MG tablet Take one tablet by mouth twice daily (Patient taking differently: Take two tablet by mouth twice daily) 180 tablet 3  . HYDROcodone-acetaminophen (NORCO) 10-325 MG per tablet Take 1 tablet by mouth every 8 (eight) hours as needed.  0  . Insulin Glargine (TOUJEO SOLOSTAR [place]) Inject 30 Units into the skin every morning.    . isosorbide mononitrate (IMDUR) 30 MG 24 hr tablet Take 15 mg  by mouth daily.  6  . lisinopril (PRINIVIL,ZESTRIL) 2.5 MG tablet Take 1 tablet (2.5 mg total) by mouth daily. 90 tablet 2  . LYRICA 150 MG capsule Take 150 mg by mouth 2 (two) times daily.  1  . metFORMIN (GLUCOPHAGE) 1000 MG tablet Take 1,000 mg by mouth 2 (two) times daily with a meal.    . nitroGLYCERIN (NITROSTAT) 0.4 MG SL tablet Place 0.4 mg under the tongue every 5 (five) minutes as needed for chest pain.    . Oxycodone HCl 10 MG TABS Take 10 mg by mouth every 8 (eight) hours as needed (moderate pain).   0  . potassium chloride (KLOR-CON) 20 MEQ packet Take 20 mEq by mouth daily. 90 tablet 3  . pravastatin (PRAVACHOL) 40 MG tablet Take 40 mg by mouth at bedtime.       Marland Kitchen SANTYL ointment Apply 1 application topically every other day.  2  . vitamin B-12 (CYANOCOBALAMIN) 1000 MCG tablet Take 1,000 mcg by mouth daily.     No current facility-administered medications for this visit.    Allergies  Allergen Reactions  . Coreg [Carvedilol] Other (See Comments)    Shortness of breath ,fatigue ,dizzyness   . Lovastatin Other (See Comments)    CK elevation, Myalgias  . Ativan [Lorazepam] Other (See Comments)    Pt. Had side effects    History   Social History  . Marital Status: Married    Spouse Name: N/A  . Number of Children: N/A  . Years of Education: N/A   Occupational History  . Not on file.   Social History Main Topics  . Smoking status: Former Smoker -- 3 years    Types: Pipe  . Smokeless tobacco: Never Used     Comment: "quit smoking pipe in early 1970's"  . Alcohol Use: Yes     Comment: 09/30/2014 "wine w/dinner maybe once/yr"  . Drug Use: No  . Sexual Activity: No   Other Topics Concern  . 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      Review of Systems: General: negative for chills, fever, night sweats or weight changes.  Cardiovascular: negative for chest pain, dyspnea on exertion, edema, orthopnea, palpitations, paroxysmal nocturnal dyspnea or shortness of breath Dermatological: negative for rash Respiratory: negative for cough or wheezing Urologic: negative for hematuria Abdominal: negative for nausea, vomiting, diarrhea, bright red blood per rectum, melena, or hematemesis Neurologic: negative for visual changes, syncope, or dizziness All other systems reviewed and are otherwise negative except as noted above.    Blood pressure 113/56, pulse 96, height 5\' 8"  (1.727 m), weight 196 lb 12.8 oz (89.268 kg).  General appearance: alert, cooperative and no distress Neck: no  carotid bruit and no JVD Lungs: faint basilar rales, bilateral diffuse rhonchi Heart: regular rate and rhythm, S1, S2 normal, no murmur, click, rub or gallop Extremities: 2+ bilateral pitting edema Pulses: 2+ and symmetric Skin: warm and dry Neurologic: Grossly normal  EKG SR with 1st Degree AVB. HR 60 bpm Lateral Twave abnormalities. New compared to prior EKG 03/11/15.   ASSESSMENT AND PLAN:   1. Acute On Chronic Combined Systolic + Diastolic CHF: known LV dysfunction with EF of 35% on most recent echo 09/2014. Has ICD. Now with progressive bilateral LE pitting edema, orthopnea, PND and DOE. He has  failed PO diuretics at home, despite increasing to 40 mg BID. Will admit to [facility] for IV diuretic therapy. Continue BB and ACE-I. Daily weights, low sodium diet, strict I/Os and daily BMP to follow renal function and electrolytes. Recheck 2D echo.   2. CAD: denies CP but with progressive dyspnea (?anginal equivalent). Also with new lateral Twave abnormalities suggestive of ischemia (New compared to EKG 1 week ago). He will likely require repeat R/LHC this admission. Continue ASA, Plavix, BB, ACE and statin.   3. Acute Renal Insufficiency: recent BMP obtained in [facility] ED 1 week ago showed increase in Scr to 1.56 (baseline ~1.0). Patient has doubled PO home diuretic over the last week. Will repeat BMP and will follow closely.   PLAN  Patient was seen and examined by Dr. Cathie Olden, office DOD. He agrees with the above assessment and plan. Will admit to [facility] for management of a/c combined systolic + diastolic CHF and for ischemia w/u.   SIMMONS, BRITTAINYPA-C 03/18/2015 11:46 AM

## 2015-03-18 NOTE — Progress Notes (Signed)
At 1305 Paged Trish Treat, card holder  At 1310 returned page and informed of pt's location and need for admitting orders from a PA or MD, she instructed that someone should be working on it and orders should be in shortly.

## 2015-03-18 NOTE — Patient Instructions (Signed)
YOUR ARE BEING ADMITTED TO [place] [facility] 20

## 2015-03-18 NOTE — Progress Notes (Signed)
Pt arrived to unit from MD office. Wife at bedside.

## 2015-03-19 DIAGNOSIS — I509 Heart failure, unspecified: Secondary | ICD-10-CM

## 2015-03-19 LAB — CBC
HEMATOCRIT: 29.3 % — AB (ref 39.0–52.0)
Hemoglobin: 9.7 g/dL — ABNORMAL LOW (ref 13.0–17.0)
MCH: 29.8 pg (ref 26.0–34.0)
MCHC: 33.1 g/dL (ref 30.0–36.0)
MCV: 90.2 fL (ref 78.0–100.0)
PLATELETS: 143 10*3/uL — AB (ref 150–400)
RBC: 3.25 MIL/uL — ABNORMAL LOW (ref 4.22–5.81)
RDW: 15.3 % (ref 11.5–15.5)
WBC: 7 10*3/uL (ref 4.0–10.5)

## 2015-03-19 LAB — BASIC METABOLIC PANEL
Anion gap: 10 (ref 5–15)
BUN: 36 mg/dL — AB (ref 6–23)
CALCIUM: 8.4 mg/dL (ref 8.4–10.5)
CO2: 28 mmol/L (ref 19–32)
Chloride: 102 mmol/L (ref 96–112)
Creatinine, Ser: 1.47 mg/dL — ABNORMAL HIGH (ref 0.50–1.35)
GFR calc Af Amer: 51 mL/min — ABNORMAL LOW (ref >90)
GFR calc non Af Amer: 44 mL/min — ABNORMAL LOW (ref >90)
Glucose, Bld: 117 mg/dL — ABNORMAL HIGH (ref 70–99)
Potassium: 4.3 mmol/L (ref 3.5–5.1)
Sodium: 140 mmol/L (ref 135–145)

## 2015-03-19 LAB — GLUCOSE, CAPILLARY
GLUCOSE-CAPILLARY: 120 mg/dL — AB (ref 70–99)
GLUCOSE-CAPILLARY: 103 mg/dL — AB (ref 70–99)
GLUCOSE-CAPILLARY: 138 mg/dL — AB (ref 70–99)
Glucose-Capillary: 98 mg/dL (ref 70–99)
Glucose-Capillary: 281 mg/dL — ABNORMAL HIGH (ref 70–99)

## 2015-03-19 MED ORDER — FUROSEMIDE 10 MG/ML IJ SOLN
40.0000 mg | Freq: Two times a day (BID) | INTRAMUSCULAR | Status: DC
  Administered 2015-03-19 – 2015-03-20 (×3): 40 mg via INTRAVENOUS
  Filled 2015-03-19 (×5): qty 4

## 2015-03-19 MED ORDER — PERFLUTREN LIPID MICROSPHERE
1.0000 mL | INTRAVENOUS | Status: AC | PRN
  Administered 2015-03-19: 2 mL via INTRAVENOUS
  Filled 2015-03-19: qty 10

## 2015-03-19 MED ORDER — INSULIN ASPART 100 UNIT/ML ~~LOC~~ SOLN
0.0000 [IU] | Freq: Three times a day (TID) | SUBCUTANEOUS | Status: DC
  Administered 2015-03-19 – 2015-03-20 (×2): 5 [IU] via SUBCUTANEOUS
  Administered 2015-03-20: 2 [IU] via SUBCUTANEOUS
  Administered 2015-03-21 (×2): 3 [IU] via SUBCUTANEOUS
  Administered 2015-03-21: 2 [IU] via SUBCUTANEOUS

## 2015-03-19 NOTE — Progress Notes (Addendum)
Patient Profile: 79 y/o male with known CAD s/p CABG x 2 in 1990 with multiple PCIs since that time. Last [facility] 10/01/14, he underwent CTO PCI of the RCA. Also with chronic combined systolic + diastolic CHF with EF of 98% on last echo 09/2014. H/o VT S/p ICD, T2DM,   HTN, HLD and PAD with carotid disease. Admitted from the office to [facility] 03/18/15 for IV diuretic therapy for acute on chronic systolic + diastolic CHF with dyspnea and volume overload. Also with new EKG changes on EKG with lateral T wave abnormalities.  Anticipating R/LHC this admission after diuresis.   Subjective: Feels better today. Breathing ok on [place]. No CP.   Objective: Vital signs in last 24 hours: Temp:  [97.5 F (36.4 C)-99 F (37.2 C)] 98.6 F (37 C) (04/28 0622) Pulse Rate:  [70-98] 75 (04/28 0622) Resp:  [18-20] 18 (04/28 0622) BP: (99-118)/(41-56) 113/46 mmHg (04/28 0622) SpO2:  [87 %-100 %] 99 % (04/28 0622) Weight:  [188 lb 4.4 oz (85.4 kg)-196 lb 12.8 oz (89.268 kg)] 188 lb 4.4 oz (85.4 kg) (04/28 0622) Last BM Date: 03/18/15  Intake/Output from previous day: 04/27 0701 - 04/28 0700 In: 470 [P.O.:470] Out: 2577 [Urine:2575; Emesis/NG output:2] Intake/Output this shift: Total I/O In: 240 [P.O.:240] Out: 0   Medications Current Facility-Administered Medications  Medication Dose Route Frequency Provider Last Rate Last Dose  . 0.9 %  sodium chloride infusion  250 mL Intravenous PRN Brittainy Erie Noe, PA-C      . acetaminophen (TYLENOL) tablet 650 mg  650 mg Oral Q4H PRN Brittainy Erie Noe, PA-C      . aspirin chewable tablet 81 mg  81 mg Oral QHS Brittainy Erie Noe, PA-C   81 mg at 03/18/15 2140  . bisoprolol (ZEBETA) tablet 2.5 mg  2.5 mg Oral Daily Brittainy M Simmons, PA-C      . clopidogrel (PLAVIX) tablet 75 mg  75 mg Oral Daily Brittainy M Simmons, PA-C      . heparin injection 5,000 Units  5,000 Units Subcutaneous 3 times per day Consuelo Pandy, PA-C   5,000 Units at 03/19/15 0517  .  isosorbide mononitrate (IMDUR) 24 hr tablet 15 mg  15 mg Oral Daily Brittainy M Simmons, PA-C      . lisinopril (PRINIVIL,ZESTRIL) tablet 2.5 mg  2.5 mg Oral Daily Brittainy M Simmons, PA-C      . nitroGLYCERIN (NITROSTAT) SL tablet 0.4 mg  0.4 mg Sublingual Q5 min PRN Brittainy M Simmons, PA-C      . ondansetron [facility]) injection 4 mg  4 mg Intravenous Q6H PRN Consuelo Pandy, PA-C   4 mg at 03/18/15 1759  . pravastatin (PRAVACHOL) tablet 40 mg  40 mg Oral QHS Brittainy Erie Noe, PA-C   40 mg at 03/18/15 2139  . pregabalin (LYRICA) capsule 150 mg  150 mg Oral BID Consuelo Pandy, PA-C   150 mg at 03/18/15 2139  . sodium chloride 0.9 % injection 3 mL  3 mL Intravenous Q12H Brittainy Erie Noe, PA-C   3 mL at 03/18/15 2140  . sodium chloride 0.9 % injection 3 mL  3 mL Intravenous PRN Brittainy Erie Noe, PA-C        PE: General appearance: alert, cooperative and no distress Neck: no carotid bruit and no JVD Lungs: faint rales bilaterally at the bases Heart: regular rate and rhythm, S1, S2 normal, no murmur, click, rub or gallop Extremities: 2+ bilateral LEE Pulses: 2+ and symmetric Skin: warm and  dry Neurologic: Grossly normal  Lab Results:   Recent Labs  03/18/15 1540  WBC 10.6*  HGB 10.4*  HCT 32.1*  PLT 172   BMET  Recent Labs  03/18/15 1540 03/19/15 0335  NA 139 140  K 4.1 4.3  CL 103 102  CO2 28 28  GLUCOSE 90 117*  BUN 43* 36*  CREATININE 1.59* 1.47*  CALCIUM 8.8 8.4    Studies/Results: 2D echo pending    Assessment/Plan  Active Problems:   Type 2 diabetes mellitus   Cardiomyopathy, ischemic   Automatic implantable cardioverter-defibrillator in situ   Anemia-chronic\   Coronary artery disease   Acute combined systolic and diastolic congestive heart failure   1.  Acute on Chronic Combined Systolic + Diastolic CHF: Good initial response to IV Lasix. Symptoms improved. Good UOP, -2.5 L out in the first day. I/Os net negative 1.9L. Still with  significant peripheral edema in lower extremities, 2+ pitting bilaterally. Desats into the upper 80s on RA. Now on 3L [place]. SCr still mildly elevated but stable. Continue IV Lasix, 40 mg BID. Continue BB and ACE-I. With EF of 35% and worsening HF, may need to consider changing to [facility] and discontinue ACE-I. Continue low sodium diet, daily weights and strict I/Os. Daily BMP to assess renal function and electrolytes.    2. CAD: H/o remote CABG. Most recent PCI was in 09/2014, s/p CTO PCI of the RCA. EKG with new lateral Twave abnormalities. CP free currently. With worsening HF, ? new Ischemia. 2D echo pending to reassess LVF and wall motion. Will plan for [facility] after diuresis. Continue ASA, Plavix, statin, BB and ACE-I.   3. Ischemic Cardiomyopathy: EF 35% on previous echo. Has ICD for VT/ Primary prevention of SCD. Continue BB and ACE-I.   4. H/O VT: Has ICD. No shocks. Telemetry shows  7 beat run of NSVT this am.  Patient was asymptomatic. Continue BB.  5. Renal Insufficiency: admission SCr was 1.59 (baseline ~1.0). Scr improved today at 1.47. Continue to monitor daily given IV diuretics.   6. HTN: well controlled on bisoprolol, lisinopril and Imdur.   7. HLD: continue statin therapy with pravastatin.   8. Abnormal CBC:  Admission CBC showed mild leukocytosis with WBC at 10.6 as well as anemia with Hgb at 10.4 (13.2 in November). Will recheck today to assess trends.   9. DM: Metformin on hold. Will use SSI.   10. Generalized Weakness/ Unstable Gait: Patient notes recent LEE weakness and increase falls at home. Will get PT to assess prior to discharge.     LOS: 1 day    Brittainy M. Ladoris Gene 03/19/2015 9:25 AM  Patient seen, examined. Available data reviewed. Agree with findings, assessment, and plan as outlined by Lyda Jester, PA-C. The patient independently interviewed and examined. He has jugular venous distention and 2+ pretibial edema bilaterally. Clinical scenario is  consistent with acute on chronic systolic heart failure. I agree with continued diuresis today with IV Lasix. The patient has extensive CAD and has undergone complex coronary intervention. He's been followed closely by Dr. [place]. At admission there was discussion of repeat right and left heart catheterization to evaluate for potential ischemic etiology of worsening heart failure. I'm going to ask for Dr. Doug Sou opinion whether we should continue with medical management versus invasive evaluation. Blood pressure is soft on a combination of low-dose beta blockade, ACE inhibitor, and long-acting nitrate. I don't think we have room to increase his medications. Chronic kidney disease is stable and  actually creatinine is slightly improved with diuresis. Will continue furosemide 40 mg IV twice daily today and reassess renal function and volume status tomorrow morning.  Sherren Mocha, M.D. 03/19/2015 10:49 AM

## 2015-03-19 NOTE — Progress Notes (Signed)
Paged Barrett, PA about patient having 7 bt run of vtach. Asymptomatic.  Will continue to monitor.

## 2015-03-19 NOTE — Progress Notes (Signed)
CARE MANAGEMENT NOTE 03/19/2015  Patient:  Victor [name], Victor [name]   Account Number:  [id number]  Date Initiated:  03/19/2015  Documentation initiated by:  Lorne Skeens  Subjective/Objective Assessment:   Patient was admitted with acute on chronic combined systolic and diastolic heart failure. Lives at home with spouse.     Action/Plan:   Will follow for discharge needs.   Anticipated DC Date:     Anticipated DC Plan:  [place]  CM consult      Choice offered to / List presented to:             Status of service:  In process, will continue to follow Medicare Important Message given?   (If response is "NO", the following Medicare IM given date fields will be blank) Date Medicare IM given:   Medicare IM given by:   Date Additional Medicare IM given:   Additional Medicare IM given by:    Discharge Disposition:    Per UR Regulation:  Reviewed for med. necessity/level of care/duration of stay  If discussed at [place] of Stay Meetings, dates discussed:    Comments:

## 2015-03-19 NOTE — Progress Notes (Signed)
UR complete.  Javien Tesch RN, MSN 

## 2015-03-19 NOTE — Progress Notes (Signed)
  Echocardiogram 2D Echocardiogram with Definity has been performed.  Arty Lantzy 03/19/2015, 12:21 PM

## 2015-03-20 ENCOUNTER — Encounter (HOSPITAL_COMMUNITY)
Admission: AD | Disposition: A | Discharge status: Home / Self Care | Payer: Self-pay | Source: Ambulatory Visit | Attending: Cardiology

## 2015-03-20 ENCOUNTER — Ambulatory Visit: Payer: Medicare Other | Admitting: Nurse Practitioner

## 2015-03-20 HISTORY — PX: LEFT HEART CATHETERIZATION WITH CORONARY ANGIOGRAM: SHX5451

## 2015-03-20 LAB — POCT ACTIVATED CLOTTING TIME
ACTIVATED CLOTTING TIME: 399 s
ACTIVATED CLOTTING TIME: 202 s
Activated Clotting Time: 294 seconds
Activated Clotting Time: 165 seconds
Activated Clotting Time: 159 seconds

## 2015-03-20 LAB — BASIC METABOLIC PANEL
Anion gap: 8 (ref 5–15)
BUN: 22 mg/dL (ref 6–23)
CALCIUM: 8.2 mg/dL — AB (ref 8.4–10.5)
CO2: 31 mmol/L (ref 19–32)
Chloride: 101 mmol/L (ref 96–112)
Creatinine, Ser: 1.27 mg/dL (ref 0.50–1.35)
GFR calc Af Amer: 61 mL/min — ABNORMAL LOW (ref >90)
GFR calc non Af Amer: 53 mL/min — ABNORMAL LOW (ref >90)
Glucose, Bld: 173 mg/dL — ABNORMAL HIGH (ref 70–99)
POTASSIUM: 3.7 mmol/L (ref 3.5–5.1)
SODIUM: 140 mmol/L (ref 135–145)

## 2015-03-20 LAB — GLUCOSE, CAPILLARY
GLUCOSE-CAPILLARY: 159 mg/dL — AB (ref 70–99)
GLUCOSE-CAPILLARY: 112 mg/dL — AB (ref 70–99)
Glucose-Capillary: 255 mg/dL — ABNORMAL HIGH (ref 70–99)

## 2015-03-20 LAB — POCT I-STAT 3, VENOUS BLOOD GAS (G3P V)
ACID-BASE EXCESS: 4 mmol/L — AB (ref 0.0–2.0)
BICARBONATE: 30 meq/L — AB (ref 20.0–24.0)
O2 SAT: 59 %
PH VEN: 7.382 — AB (ref 7.250–7.300)
PO2 VEN: 32 mmHg (ref 30.0–45.0)
TCO2: 32 mmol/L (ref 0–100)
pCO2, Ven: 50.5 mmHg — ABNORMAL HIGH (ref 45.0–50.0)

## 2015-03-20 LAB — HEMOGLOBIN A1C
Hgb A1c MFr Bld: 7.6 % — ABNORMAL HIGH (ref 4.8–5.6)
MEAN PLASMA GLUCOSE: 171 mg/dL

## 2015-03-20 LAB — CBC
HCT: 28.4 % — ABNORMAL LOW (ref 39.0–52.0)
Hemoglobin: 9.4 g/dL — ABNORMAL LOW (ref 13.0–17.0)
MCH: 29.3 pg (ref 26.0–34.0)
MCHC: 33.1 g/dL (ref 30.0–36.0)
MCV: 88.5 fL (ref 78.0–100.0)
Platelets: 139 10*3/uL — ABNORMAL LOW (ref 150–400)
RBC: 3.21 MIL/uL — ABNORMAL LOW (ref 4.22–5.81)
RDW: 15.3 % (ref 11.5–15.5)
WBC: 5.9 10*3/uL (ref 4.0–10.5)

## 2015-03-20 LAB — POCT I-STAT 3, ART BLOOD GAS (G3+)
Acid-Base Excess: 4 mmol/L — ABNORMAL HIGH (ref 0.0–2.0)
BICARBONATE: 29 meq/L — AB (ref 20.0–24.0)
O2 Saturation: 98 %
PH ART: 7.415 (ref 7.350–7.450)
TCO2: 30 mmol/L (ref 0–100)
pCO2 arterial: 45.3 mmHg — ABNORMAL HIGH (ref 35.0–45.0)
pO2, Arterial: 101 mmHg — ABNORMAL HIGH (ref 80.0–100.0)

## 2015-03-20 LAB — PROTIME-INR
INR: 1.19 (ref 0.00–1.49)
Prothrombin Time: 15.3 seconds — ABNORMAL HIGH (ref 11.6–15.2)

## 2015-03-20 SURGERY — LEFT HEART CATHETERIZATION WITH CORONARY ANGIOGRAM
Anesthesia: LOCAL

## 2015-03-20 MED ORDER — MORPHINE SULFATE 2 MG/ML IJ SOLN
2.0000 mg | INTRAMUSCULAR | Status: AC | PRN
  Administered 2015-03-20: 2 mg via INTRAVENOUS
  Filled 2015-03-20: qty 1

## 2015-03-20 MED ORDER — ASPIRIN 81 MG PO CHEW
81.0000 mg | CHEWABLE_TABLET | Freq: Every day | ORAL | Status: DC
  Administered 2015-03-22: 81 mg via ORAL
  Filled 2015-03-20 (×2): qty 1

## 2015-03-20 MED ORDER — FUROSEMIDE 40 MG PO TABS
40.0000 mg | ORAL_TABLET | Freq: Two times a day (BID) | ORAL | Status: DC
  Administered 2015-03-20 – 2015-03-22 (×4): 40 mg via ORAL
  Filled 2015-03-20 (×6): qty 1

## 2015-03-20 MED ORDER — CLOPIDOGREL BISULFATE 75 MG PO TABS
75.0000 mg | ORAL_TABLET | Freq: Every day | ORAL | Status: DC
  Administered 2015-03-21 – 2015-03-22 (×2): 75 mg via ORAL
  Filled 2015-03-20 (×2): qty 1

## 2015-03-20 MED ORDER — HYDROCODONE-ACETAMINOPHEN 10-325 MG PO TABS
1.0000 | ORAL_TABLET | Freq: Three times a day (TID) | ORAL | Status: DC | PRN
  Administered 2015-03-20: 20:00:00 1 via ORAL
  Filled 2015-03-20: qty 1

## 2015-03-20 MED ORDER — HEPARIN SODIUM (PORCINE) 5000 UNIT/ML IJ SOLN
5000.0000 [IU] | Freq: Three times a day (TID) | INTRAMUSCULAR | Status: DC
  Administered 2015-03-21 – 2015-03-22 (×4): 5000 [IU] via SUBCUTANEOUS
  Filled 2015-03-20 (×3): qty 1

## 2015-03-20 MED ORDER — NITROGLYCERIN 1 MG/10 ML FOR IR/CATH LAB
INTRA_ARTERIAL | Status: AC
  Filled 2015-03-20: qty 10

## 2015-03-20 MED ORDER — SODIUM CHLORIDE 0.9 % IJ SOLN
3.0000 mL | Freq: Two times a day (BID) | INTRAMUSCULAR | Status: DC
Start: 2015-03-20 — End: 2015-03-20

## 2015-03-20 MED ORDER — HEPARIN (PORCINE) IN NACL 2-0.9 UNIT/ML-% IJ SOLN
INTRAMUSCULAR | Status: AC
  Filled 2015-03-20: qty 1000

## 2015-03-20 MED ORDER — ATROPINE SULFATE 0.1 MG/ML IJ SOLN
INTRAMUSCULAR | Status: AC
  Filled 2015-03-20: qty 10

## 2015-03-20 MED ORDER — ASPIRIN 81 MG PO CHEW
81.0000 mg | CHEWABLE_TABLET | ORAL | Status: DC
Start: 2015-03-21 — End: 2015-03-20

## 2015-03-20 MED ORDER — FENTANYL CITRATE (PF) 100 MCG/2ML IJ SOLN
INTRAMUSCULAR | Status: AC
  Filled 2015-03-20: qty 2

## 2015-03-20 MED ORDER — LIDOCAINE HCL (PF) 1 % IJ SOLN
INTRAMUSCULAR | Status: AC
  Filled 2015-03-20: qty 30

## 2015-03-20 MED ORDER — MIDAZOLAM HCL 2 MG/2ML IJ SOLN
INTRAMUSCULAR | Status: AC
  Filled 2015-03-20: qty 2

## 2015-03-20 MED ORDER — SODIUM CHLORIDE 0.9 % IV SOLN
INTRAVENOUS | Status: DC
  Administered 2015-03-20: 11:00:00 via INTRAVENOUS

## 2015-03-20 MED ORDER — LIVING BETTER WITH HEART FAILURE BOOK
Freq: Once | Status: AC
  Administered 2015-03-20: 10:00:00

## 2015-03-20 MED ORDER — SODIUM CHLORIDE 0.9 % IJ SOLN: 3.0000 mL | INTRAMUSCULAR | Status: DC | PRN

## 2015-03-20 MED ORDER — SODIUM CHLORIDE 0.9 % IV SOLN
1.0000 mL/kg/h | INTRAVENOUS | Status: AC
  Administered 2015-03-20: 20:00:00 1 mL/kg/h via INTRAVENOUS

## 2015-03-20 MED ORDER — HEPARIN SODIUM (PORCINE) 1000 UNIT/ML IJ SOLN
INTRAMUSCULAR | Status: AC
  Filled 2015-03-20: qty 1

## 2015-03-20 MED ORDER — SODIUM CHLORIDE 0.9 % IV SOLN: 250.0000 mL | INTRAVENOUS | Status: DC | PRN

## 2015-03-20 NOTE — CV Procedure (Signed)
Cardiac Catheterization Procedure Note  Name: Victor [name] MRN: [id number] DOB: 07-Mar-1936  Procedure: Right  Cath, Selective Coronary Angiography, LV angiography,  PTCA attempt and IVUS of the RCA  Indication: 79 yo WM with history of CAD s/p CABG with known failure of grafts. S/p CTO PCO of the RCA (unable to stent due to heavy calcification. Now presents with acute CHF. Last EF by Echo 35%   Diagnostic Procedure Details: The right groin was prepped, draped, and anesthetized with 1% lidocaine. Using the modified Seldinger technique, a 5 French sheath was introduced into the right femoral artery. A 7 Fr sheath was placed in the right femoral vein. A Swan Ganz catheter was used to measure right heart pressures. C.O. Calculated by Fick.  Standard Judkins catheters were used for selective coronary angiography. Catheter exchanges were performed over a wire.  The diagnostic procedure was well-tolerated without immediate complications.  PROCEDURAL FINDINGS Hemodynamics: AO 104/42 mean 67 mm Hg RA 6/6 mean 3 mm Hg RV 31/8 mm Hg PA 35/8 mean 19 mm Hg PCWP 11/8 mean 8 mm Hg  Sats: PA 59% Ao 98%  Cardiac output: 5.26 L/min  Cardiac index 2.67 L/min/meter squared. Coronary angiography: Coronary dominance: right  Left mainstem: Normal  Left anterior descending (LAD): The LAD is heavily calcified in the proximal vessel. There is a 30-40% stenosis in the proximal vessel. The first diagonal has an 80% ostial stenosis. The second diagonal is without significant disease.   Left circumflex (LCx): The LCx is occluded distally. The first and second OM branches are occluded.   Right coronary artery (RCA): The RCA is severely diseased with severely calcified lesion proximally with 90% stenosis. The mid RCA is occluded but there appeared to be a micro channel through the lesion there is filling of the RCA distally as well as some left to right collaterals.   Left ventriculography: not done  PCI  Procedure Note:  Following the diagnostic procedure, the decision was made to attempt  PCI of the RCA. The sheath was upsized to a 7 [place]. Weight-based heparin was given for anticoagulation. Once a therapeutic ACT was achieved, a 7 [place] LA1 guide catheter was inserted.  A Fielder XT  coronary guidewire was used to cross the lesion in the proximal vessel. The wire selected an RV marginal branch and we were unable to direct the wire into the true RCA after the marginal branch.  Several different wires were attempted including a prowater, Miracle bros. 3 and Whisper wire with similar result. We did confirm we were in the true lumen into the RV branch. Since we were unable to access the mid to distal RCA no intervention was performed and the procedure was aborted with no change in the occlusion.  Femoral hemostasis was achieved with manual compression.  The patient tolerated the PCI procedure well. There were no immediate procedural complications.  The patient was transferred to the post catheterization recovery area for further monitoring.  PCI Data: Vessel - RCA /Segment - mid  Percent Stenosis (pre)  1005 TIMI-flow 1 Unable to cross with a wire.  Percent Stenosis (post) 100% TIMI-flow (post) 1  Final Conclusions:   1. Severe 2 vessel obstructive CAD with chronic occlusion of OMs and RCA 2. Known occlusion of SVGs from prior caths. 3. Normal right heart and LV filling pressures. 4. Unsuccessful attempt at PCI of the RCA due to inability to cross occlusion in the mid vessel after the RV marginal branch.  Recommendations: Continue medical  therapy. Will review with Dr. Irish Lack to see if there are any further options for CTO PCI. If stable patient can be DC this weekend.   Moe Brier [place], [place]  03/20/2015, 6:17 PM

## 2015-03-20 NOTE — Interval H&P Note (Signed)
History and Physical Interval Note:  03/20/2015 4:19 PM  Victor [name]  has presented today for surgery, with the diagnosis of cp  The various methods of treatment have been discussed with the patient and family. After consideration of risks, benefits and other options for treatment, the patient has consented to  Procedure(s): LEFT HEART CATHETERIZATION WITH CORONARY ANGIOGRAM (N/A) as a surgical intervention .  The patient's history has been reviewed, patient examined, no change in status, stable for surgery.  I have reviewed the patient's chart and labs.  Questions were answered to the patient's satisfaction.    Cath Lab Visit (complete for each Cath Lab visit)  Clinical Evaluation Leading to the Procedure:   ACS: No.  Non-ACS:    Anginal Classification: CCS III  Anti-ischemic medical therapy: Maximal Therapy (2 or more classes of medications)  Non-Invasive Test Results: No non-invasive testing performed  Prior CABG: Previous CABG       Victor [name] [facility] 03/20/2015 4:19 PM

## 2015-03-20 NOTE — Progress Notes (Signed)
Inpatient Diabetes Program Recommendations  AACE/ADA: New Consensus Statement on Inpatient Glycemic Control (2013)  Target Ranges:  Prepandial:   less than 140 mg/dL      Peak postprandial:   less than 180 mg/dL (1-2 hours)      Critically ill patients:  140 - 180 mg/dL     Results for PASTOR, SGRO (MRN [id number]) as of 03/20/2015 11:50  Ref. Range 03/20/2015 05:47 03/20/2015 11:38  Glucose-Capillary Latest Ref Range: 70-99 mg/dL 159 (H) 255 (H)    Home DM Meds: Toujeo 30 units QAM       Metformin 1000 mg bid  Current DM Orders: Novolog Sensitive SSI tid     **Note patient is currently NPO for cardiac cath.  **Has been consuming 100% of meals.   MD- Please consider the following in-hospital insulin adjustments:  1. Start 1/3 patient's home dose of basal insulin as Lantus 10 units QHS  2. Add Novolog Meal Coverage- Novolog 4 units tid with meals     Will follow Wyn Quaker RN, MSN, CDE Diabetes Coordinator Inpatient Diabetes Program Team Pager: [phone] (8a-5p)

## 2015-03-20 NOTE — Evaluation (Signed)
Physical Therapy Evaluation/ Discharge Patient Details Name: Victor [name] MRN: [id number] DOB: 06-01-1936 Today's Date: 03/20/2015   History of Present Illness  79 yo with hx of CABG, CHF admitted with complaints of 1 week h/o progressive bilateral LEE, abdominal fullness + nausea, orthopnea and PND.   Clinical Impression  Pt reports normal independent function with decreased strength, function and breathing over the last few months. Pt reports orthopnea however in supine sats remain 94% on RA currently with walking sats 90-93% with brief drop to 86% recovered quickly with cues for breathing. Strength 5/5 bil LE and pt educated to continue walking and perform HEP acutely to maintain function. Pt feels SOB periodically but unable to reproduce. Would recommend continuous pulse ox acutely particularly for gait and sleeping. All education complete with spouse available at home, pt with cane and education complete. No further needs at this time with pt and spouse agreeable, will sign off.     Follow Up Recommendations No PT follow up    Equipment Recommendations  None recommended by PT    Recommendations for Other Services       Precautions / Restrictions Precautions Precautions: Fall      Mobility  Bed Mobility Overal bed mobility: Modified Independent                Transfers Overall transfer level: Independent                  Ambulation/Gait Ambulation/Gait assistance: Independent Ambulation Distance (Feet): 350 Feet   Gait Pattern/deviations: Step-through pattern;WFL(Within Functional Limits)   Gait velocity interpretation: at or above normal speed for age/gender General Gait Details: Pt with sats 90-93% the majority of gait with 2 drops to 86% with quick return to 90% or greater with cues for pursed lip breathing  Stairs            Wheelchair Mobility    Modified Rankin (Stroke Patients Only)       Balance Overall balance assessment: History of  Falls                                           Pertinent Vitals/Pain Pain Assessment: 0-10 Pain Score: 2  Pain Location: bil thigh Pain Descriptors / Indicators: Aching Pain Intervention(s): Limited activity within patient's tolerance;Repositioned  HR 73    Home Living Family/patient expects to be discharged to:: Private residence Living Arrangements: Spouse/significant other Available Help at Discharge: Family;Available 24 hours/day Type of Home: House Home Access: Stairs to enter   [employer] of Steps: 4 Home Layout: One level Home Equipment: Cane - single point      Prior Function Level of Independence: Independent with assistive device(s)         Comments: Uses cane all the time as long as he remembers it , states he forgets at least once a day. Cares for 20 horses, 3 goats, cats and dogs along with wife     Hand Dominance        Extremity/Trunk Assessment   Upper Extremity Assessment: Overall WFL for tasks assessed           Lower Extremity Assessment: Overall WFL for tasks assessed (5/5 bil LE pain)      Cervical / Trunk Assessment: Normal  Communication   Communication: No difficulties  Cognition Arousal/Alertness: Awake/alert Behavior During Therapy: WFL for tasks assessed/performed Overall Cognitive Status: Within  Functional Limits for tasks assessed                      General Comments      Exercises General Exercises - Lower Extremity Long Arc Quad: AROM;Seated;Both;10 reps Hip Flexion/Marching: AROM;Seated;Both;10 reps      Assessment/Plan    PT Assessment Patent does not need any further PT services  PT Diagnosis Difficulty walking   PT Problem List    PT Treatment Interventions     PT Goals (Current goals can be found in the Care Plan section) Acute Rehab PT Goals PT Goal Formulation: All assessment and education complete, DC therapy    Frequency     Barriers to discharge         Co-evaluation               End of Session   Activity Tolerance: Patient tolerated treatment well Patient left: in bed;with call bell/phone within reach;with family/visitor present Nurse Communication: Mobility status         Time: 3893-7342 PT Time Calculation (min) (ACUTE ONLY): 21 min   Charges:   PT Evaluation $Initial PT Evaluation Tier I: 1 Procedure     PT G CodesMelford Aase 03/20/2015, 1:54 PM Elwyn Reach, [place]

## 2015-03-20 NOTE — Progress Notes (Signed)
Patient Profile: 79 y/o male with known CAD s/p CABG x 2 in 1990 with multiple PCIs since that time. Last [facility] 10/01/14, he underwent CTO PCI of the RCA. Also with chronic combined systolic + diastolic CHF with EF of 62% on last echo 09/2014. H/o VT S/p ICD, T2DM, HTN, HLD and PAD with carotid disease. Admitted from the office to [facility] 03/18/15 for IV diuretic therapy for acute on chronic systolic + diastolic CHF with dyspnea and volume overload. Also with new EKG changes on EKG with lateral T wave abnormalities.  Subjective: Feels better. Breathing improved. No further CP. Sleep well last night.   Objective: Vital signs in last 24 hours: Temp:  [97 F (36.1 C)-97.9 F (36.6 C)] 97.9 F (36.6 C) (04/29 0518) Pulse Rate:  [58-60] 60 (04/29 0620) Resp:  [18] 18 (04/29 0518) BP: (96-121)/(40-47) 113/40 mmHg (04/29 0518) SpO2:  [92 %] 92 % (04/29 0518) Weight:  [183 lb 8 oz (83.235 kg)] 183 lb 8 oz (83.235 kg) (04/29 0518) Last BM Date: 03/18/15  Intake/Output from previous day: 04/28 0701 - 04/29 0700 In: 1215 [P.O.:1215] Out: 2875 [Urine:2875] Intake/Output this shift:    Medications Current Facility-Administered Medications  Medication Dose Route Frequency Provider Last Rate Last Dose  . 0.9 %  sodium chloride infusion  250 mL Intravenous PRN Brittainy Erie Noe, PA-C      . acetaminophen (TYLENOL) tablet 650 mg  650 mg Oral Q4H PRN Brittainy Erie Noe, PA-C      . aspirin chewable tablet 81 mg  81 mg Oral QHS Brittainy Erie Noe, PA-C   81 mg at 03/19/15 2132  . bisoprolol (ZEBETA) tablet 2.5 mg  2.5 mg Oral Daily Brittainy Erie Noe, PA-C   2.5 mg at 03/19/15 9528  . clopidogrel (PLAVIX) tablet 75 mg  75 mg Oral Daily Consuelo Pandy, PA-C   75 mg at 03/19/15 4132  . furosemide (LASIX) injection 40 mg  40 mg Intravenous BID Consuelo Pandy, PA-C   40 mg at 03/19/15 1746  . heparin injection 5,000 Units  5,000 Units Subcutaneous 3 times per day Consuelo Pandy, PA-C    5,000 Units at 03/20/15 0555  . insulin aspart (novoLOG) injection 0-9 Units  0-9 Units Subcutaneous TID WC Consuelo Pandy, PA-C   2 Units at 03/20/15 4401  . isosorbide mononitrate (IMDUR) 24 hr tablet 15 mg  15 mg Oral Daily Brittainy Erie Noe, PA-C   15 mg at 03/19/15 0272  . lisinopril (PRINIVIL,ZESTRIL) tablet 2.5 mg  2.5 mg Oral Daily Brittainy Erie Noe, PA-C   2.5 mg at 03/19/15 0951  . nitroGLYCERIN (NITROSTAT) SL tablet 0.4 mg  0.4 mg Sublingual Q5 min PRN Brittainy M Simmons, PA-C      . ondansetron [facility]) injection 4 mg  4 mg Intravenous Q6H PRN Consuelo Pandy, PA-C   4 mg at 03/18/15 1759  . pravastatin (PRAVACHOL) tablet 40 mg  40 mg Oral QHS Brittainy Erie Noe, PA-C   40 mg at 03/19/15 2133  . pregabalin (LYRICA) capsule 150 mg  150 mg Oral BID Consuelo Pandy, PA-C   150 mg at 03/19/15 2132  . sodium chloride 0.9 % injection 3 mL  3 mL Intravenous Q12H Brittainy Erie Noe, PA-C   3 mL at 03/19/15 2133  . sodium chloride 0.9 % injection 3 mL  3 mL Intravenous PRN Brittainy Erie Noe, PA-C        PE: General appearance: alert, cooperative and no distress Neck:  no carotid bruit and no JVD Lungs: clear to auscultation bilaterally Heart: regular rate and rhythm, S1, S2 normal, no murmur, click, rub or gallop Extremities: 1+ bilateral LEE L>R Pulses: 2+ and symmetric Skin: warm and dry Neurologic: Grossly normal  Lab Results:   Recent Labs  03/18/15 1540 03/19/15 1040 03/20/15 0442  WBC 10.6* 7.0 5.9  HGB 10.4* 9.7* 9.4*  HCT 32.1* 29.3* 28.4*  PLT 172 143* 139*   BMET  Recent Labs  03/18/15 1540 03/19/15 0335 03/20/15 0442  NA 139 140 140  K 4.1 4.3 3.7  CL 103 102 101  CO2 28 28 31   GLUCOSE 90 117* 173*  BUN 43* 36* 22  CREATININE 1.59* 1.47* 1.27  CALCIUM 8.8 8.4 8.2*   PT/INR No results for input(s): LABPROT, INR in the last 72 hours. Cholesterol No results for input(s): CHOL in the last 72 hours. Cardiac Enzymes Invalid input(s):  TROPONIN,  CKMB  Studies/Results:  2D Echo 03/19/15 Study Conclusions  - Left ventricle: The cavity size was normal. Systolic function was moderately reduced. The estimated ejection fraction was in the range of 35% to 40%. Diffuse hypokinesis. There is akinesis of the basalinferior myocardium. There is hypokinesis of the inferolateral myocardium. - Aortic valve: Trileaflet; normal thickness, mildly calcified leaflets. - Mitral valve: Calcified annulus. Mild calcification. There was mild regurgitation. - Left atrium: The atrium was mildly dilated. - Pulmonary arteries: PA peak pressure: 48 mm Hg (S). - Pericardium, extracardiac: A trivial pericardial effusion was identified posterior to the heart.  Impressions:  - The right ventricular systolic pressure was increased consistent with moderate pulmonary hypertension.   Assessment/Plan  Active Problems:   Type 2 diabetes mellitus   Cardiomyopathy, ischemic   Automatic implantable cardioverter-defibrillator in situ   Anemia-chronic\   Coronary artery disease   Acute combined systolic and diastolic congestive heart failure   1. Acute on Chronic Combined Systolic + Diastolic CHF: Good initial response to IV Lasix. Symptoms improved. Good UOP, An additional 2.8L out yesterday.  I/Os net negative 3.8L. Still with 1+ bilateral peripheral edema in lower extremities. SCr improving. Continue IV Lasix, 40 mg BID. Continue BB and ACE-I. May need to consider changing to [facility] and discontinue ACE-I. Continue low sodium diet, daily weights and strict I/Os. Daily BMP to assess renal function and electrolytes.   2. CAD: H/o remote CABG. Most recent PCI was in 09/2014, s/p CTO PCI of the RCA. Admission EKG with new lateral Twave abnormalities. CP free currently. ? R/LHC. Will consult with his primary cardiologist Dr. Martinique. ? Re-evaluate with cath vs medical management. Continue ASA, Plavix, statin, BB and ACE-I.   3.  Ischemic Cardiomyopathy: EF unchanged on echo. Still 35-40%. Has ICD for VT/ Primary prevention of SCD. Continue BB and ACE-I.   4. H/O VT: Has ICD. No shocks. With occasional PVCs no further runs of NSVT in the last 12 hrs (had a 7 beat asymptomatic run of NSVT 4/28).Continue BB.  5. AKI on CKD III: admission SCr was 1.59 (baseline ~1.0). Scr continues to improve after diuresis 1.59--> 1.47 -->1.27.   6. HTN: well controlled on bisoprolol, lisinopril and Imdur. Most recent 113/40. BP is too soft for further titration.   7. HLD: continue statin therapy with pravastatin.   8. Abnormal CBC: Admission CBC showed mild leukocytosis with WBC at 10.6 as well as anemia with Hgb at 10.4. WBC now back to normal. His anemia is chronic. Hgb ~9-10. Continue to monitor.   9. DM: Metformin on  hold. Will use SSI.   10. Generalized Weakness/ Unstable Gait: Patient notes recent LEE weakness and increase falls at home. Will get PT to assess prior to discharge.     LOS: 2 days    Brittainy M. Ladoris Gene 03/20/2015 7:40 AM  Patient seen, examined. Available data reviewed. Agree with findings, assessment, and plan as outlined by Lyda Jester, PA-C. The patient is independently interviewed and examined. Volume status is significantly improved, now 4 L negative since admission. I discussed his case with Dr. Martinique. We agree that cardiac catheterization and possible PCI are indicated in this patient whose typical presentation in the past has been shortness of breath and congestive heart failure.  I have reviewed the risks, indications, and alternatives to cardiac catheterization and possible PCI with the patient. Risks include but are not limited to bleeding, infection, vascular injury, stroke, myocardial infection, arrhythmia, kidney injury, radiation-related injury in the case of prolonged fluoroscopy use, emergency cardiac surgery, and death. The patient understands the risks of serious complication  is low (<5%).   Sherren Mocha, M.D. 03/20/2015 10:25 AM

## 2015-03-20 NOTE — Progress Notes (Signed)
Site area: right groin  Site Prior to Removal:  Level 0  Pressure Applied For 20 MINUTES    Minutes Beginning at 2040  Manual:   Yes.    Patient Status During Pull:  [id number]  Post Pull Groin Site:  Level 0  Post Pull Instructions Given:  Yes.    Post Pull Pulses Present:  Yes.    Dressing Applied:  Yes.    Comments:  Pt tolerated sheath removal without complication, will continue to monitor patient

## 2015-03-20 NOTE — Clinical Documentation Improvement (Signed)
Presents with Acute on Chronic combined Systolic and Diastolic Heart Failure.   White male  Renal Insufficiency documented  Patient was admitted with a Creatinine of 1.59; dropped to 1,47, 1.27  Baseline Creatinine is documented at ~ 1.0 in 03/19/15 progress note  This is a drop in Creatinine of 0.32 in a timeframe of 48 hours  Please provide a diagnosis associated with the above clinical indicators and document findings in next progress note and include in discharge summary if applicable.  Acute Renal Failure/Acute Kidney Injury Acute on Chronic Renal Failure - GFR's for this admission running from 44 to 53; please identify stage of CKD as well Chronic Renal Failure - GFR's for this admission running from 44 to 53 Other Condition Cannot Clinically Determine   Thank You, Zoila Shutter ,RN Clinical Documentation Specialist:  [facility]

## 2015-03-20 NOTE — H&P (View-Only) (Signed)
Patient Profile: 79 y/o male with known CAD s/p CABG x 2 in 1990 with multiple PCIs since that time. Last [facility] 10/01/14, he underwent CTO PCI of the RCA. Also with chronic combined systolic + diastolic CHF with EF of 63% on last echo 09/2014. H/o VT S/p ICD, T2DM, HTN, HLD and PAD with carotid disease. Admitted from the office to [facility] 03/18/15 for IV diuretic therapy for acute on chronic systolic + diastolic CHF with dyspnea and volume overload. Also with new EKG changes on EKG with lateral T wave abnormalities.  Subjective: Feels better. Breathing improved. No further CP. Sleep well last night.   Objective: Vital signs in last 24 hours: Temp:  [97 F (36.1 C)-97.9 F (36.6 C)] 97.9 F (36.6 C) (04/29 0518) Pulse Rate:  [58-60] 60 (04/29 0620) Resp:  [18] 18 (04/29 0518) BP: (96-121)/(40-47) 113/40 mmHg (04/29 0518) SpO2:  [92 %] 92 % (04/29 0518) Weight:  [183 lb 8 oz (83.235 kg)] 183 lb 8 oz (83.235 kg) (04/29 0518) Last BM Date: 03/18/15  Intake/Output from previous day: 04/28 0701 - 04/29 0700 In: 1215 [P.O.:1215] Out: 2875 [Urine:2875] Intake/Output this shift:    Medications Current Facility-Administered Medications  Medication Dose Route Frequency Provider Last Rate Last Dose  . 0.9 %  sodium chloride infusion  250 mL Intravenous PRN Brittainy Erie Noe, PA-C      . acetaminophen (TYLENOL) tablet 650 mg  650 mg Oral Q4H PRN Brittainy Erie Noe, PA-C      . aspirin chewable tablet 81 mg  81 mg Oral QHS Brittainy Erie Noe, PA-C   81 mg at 03/19/15 2132  . bisoprolol (ZEBETA) tablet 2.5 mg  2.5 mg Oral Daily Brittainy Erie Noe, PA-C   2.5 mg at 03/19/15 3354  . clopidogrel (PLAVIX) tablet 75 mg  75 mg Oral Daily Consuelo Pandy, PA-C   75 mg at 03/19/15 5625  . furosemide (LASIX) injection 40 mg  40 mg Intravenous BID Consuelo Pandy, PA-C   40 mg at 03/19/15 1746  . heparin injection 5,000 Units  5,000 Units Subcutaneous 3 times per day Consuelo Pandy, PA-C    5,000 Units at 03/20/15 0555  . insulin aspart (novoLOG) injection 0-9 Units  0-9 Units Subcutaneous TID WC Consuelo Pandy, PA-C   2 Units at 03/20/15 6389  . isosorbide mononitrate (IMDUR) 24 hr tablet 15 mg  15 mg Oral Daily Brittainy Erie Noe, PA-C   15 mg at 03/19/15 3734  . lisinopril (PRINIVIL,ZESTRIL) tablet 2.5 mg  2.5 mg Oral Daily Brittainy Erie Noe, PA-C   2.5 mg at 03/19/15 0951  . nitroGLYCERIN (NITROSTAT) SL tablet 0.4 mg  0.4 mg Sublingual Q5 min PRN Brittainy M Simmons, PA-C      . ondansetron [facility]) injection 4 mg  4 mg Intravenous Q6H PRN Consuelo Pandy, PA-C   4 mg at 03/18/15 1759  . pravastatin (PRAVACHOL) tablet 40 mg  40 mg Oral QHS Brittainy Erie Noe, PA-C   40 mg at 03/19/15 2133  . pregabalin (LYRICA) capsule 150 mg  150 mg Oral BID Consuelo Pandy, PA-C   150 mg at 03/19/15 2132  . sodium chloride 0.9 % injection 3 mL  3 mL Intravenous Q12H Brittainy Erie Noe, PA-C   3 mL at 03/19/15 2133  . sodium chloride 0.9 % injection 3 mL  3 mL Intravenous PRN Brittainy Erie Noe, PA-C        PE: General appearance: alert, cooperative and no distress Neck:  no carotid bruit and no JVD Lungs: clear to auscultation bilaterally Heart: regular rate and rhythm, S1, S2 normal, no murmur, click, rub or gallop Extremities: 1+ bilateral LEE L>R Pulses: 2+ and symmetric Skin: warm and dry Neurologic: Grossly normal  Lab Results:   Recent Labs  03/18/15 1540 03/19/15 1040 03/20/15 0442  WBC 10.6* 7.0 5.9  HGB 10.4* 9.7* 9.4*  HCT 32.1* 29.3* 28.4*  PLT 172 143* 139*   BMET  Recent Labs  03/18/15 1540 03/19/15 0335 03/20/15 0442  NA 139 140 140  K 4.1 4.3 3.7  CL 103 102 101  CO2 28 28 31   GLUCOSE 90 117* 173*  BUN 43* 36* 22  CREATININE 1.59* 1.47* 1.27  CALCIUM 8.8 8.4 8.2*   PT/INR No results for input(s): LABPROT, INR in the last 72 hours. Cholesterol No results for input(s): CHOL in the last 72 hours. Cardiac Enzymes Invalid input(s):  TROPONIN,  CKMB  Studies/Results:  2D Echo 03/19/15 Study Conclusions  - Left ventricle: The cavity size was normal. Systolic function was moderately reduced. The estimated ejection fraction was in the range of 35% to 40%. Diffuse hypokinesis. There is akinesis of the basalinferior myocardium. There is hypokinesis of the inferolateral myocardium. - Aortic valve: Trileaflet; normal thickness, mildly calcified leaflets. - Mitral valve: Calcified annulus. Mild calcification. There was mild regurgitation. - Left atrium: The atrium was mildly dilated. - Pulmonary arteries: PA peak pressure: 48 mm Hg (S). - Pericardium, extracardiac: A trivial pericardial effusion was identified posterior to the heart.  Impressions:  - The right ventricular systolic pressure was increased consistent with moderate pulmonary hypertension.   Assessment/Plan  Active Problems:   Type 2 diabetes mellitus   Cardiomyopathy, ischemic   Automatic implantable cardioverter-defibrillator in situ   Anemia-chronic\   Coronary artery disease   Acute combined systolic and diastolic congestive heart failure   1. Acute on Chronic Combined Systolic + Diastolic CHF: Good initial response to IV Lasix. Symptoms improved. Good UOP, An additional 2.8L out yesterday.  I/Os net negative 3.8L. Still with 1+ bilateral peripheral edema in lower extremities. SCr improving. Continue IV Lasix, 40 mg BID. Continue BB and ACE-I. May need to consider changing to [facility] and discontinue ACE-I. Continue low sodium diet, daily weights and strict I/Os. Daily BMP to assess renal function and electrolytes.   2. CAD: H/o remote CABG. Most recent PCI was in 09/2014, s/p CTO PCI of the RCA. Admission EKG with new lateral Twave abnormalities. CP free currently. ? R/LHC. Will consult with his primary cardiologist Dr. Martinique. ? Re-evaluate with cath vs medical management. Continue ASA, Plavix, statin, BB and ACE-I.   3.  Ischemic Cardiomyopathy: EF unchanged on echo. Still 35-40%. Has ICD for VT/ Primary prevention of SCD. Continue BB and ACE-I.   4. H/O VT: Has ICD. No shocks. With occasional PVCs no further runs of NSVT in the last 12 hrs (had a 7 beat asymptomatic run of NSVT 4/28).Continue BB.  5. AKI on CKD III: admission SCr was 1.59 (baseline ~1.0). Scr continues to improve after diuresis 1.59--> 1.47 -->1.27.   6. HTN: well controlled on bisoprolol, lisinopril and Imdur. Most recent 113/40. BP is too soft for further titration.   7. HLD: continue statin therapy with pravastatin.   8. Abnormal CBC: Admission CBC showed mild leukocytosis with WBC at 10.6 as well as anemia with Hgb at 10.4. WBC now back to normal. His anemia is chronic. Hgb ~9-10. Continue to monitor.   9. DM: Metformin on  hold. Will use SSI.   10. Generalized Weakness/ Unstable Gait: Patient notes recent LEE weakness and increase falls at home. Will get PT to assess prior to discharge.     LOS: 2 days    Brittainy M. Ladoris Gene 03/20/2015 7:40 AM  Patient seen, examined. Available data reviewed. Agree with findings, assessment, and plan as outlined by Lyda Jester, PA-C. The patient is independently interviewed and examined. Volume status is significantly improved, now 4 L negative since admission. I discussed his case with Dr. Martinique. We agree that cardiac catheterization and possible PCI are indicated in this patient whose typical presentation in the past has been shortness of breath and congestive heart failure.  I have reviewed the risks, indications, and alternatives to cardiac catheterization and possible PCI with the patient. Risks include but are not limited to bleeding, infection, vascular injury, stroke, myocardial infection, arrhythmia, kidney injury, radiation-related injury in the case of prolonged fluoroscopy use, emergency cardiac surgery, and death. The patient understands the risks of serious complication  is low (<9%).   Sherren Mocha, M.D. 03/20/2015 10:25 AM

## 2015-03-20 NOTE — Progress Notes (Signed)
Pt received into the Cath Lab holding area for a cath procedure. Pt on monitor, IVF infusing in right arm with no problem, consent signed and pt denies any discomfort at this time.

## 2015-03-21 ENCOUNTER — Inpatient Hospital Stay (HOSPITAL_COMMUNITY): Payer: Medicare Other

## 2015-03-21 ENCOUNTER — Encounter (HOSPITAL_COMMUNITY): Payer: Self-pay | Admitting: Cardiology

## 2015-03-21 LAB — BASIC METABOLIC PANEL
ANION GAP: 8 (ref 5–15)
BUN: 14 mg/dL (ref 6–23)
CO2: 30 mmol/L (ref 19–32)
Calcium: 8.1 mg/dL — ABNORMAL LOW (ref 8.4–10.5)
Chloride: 102 mmol/L (ref 96–112)
Creatinine, Ser: 1.07 mg/dL (ref 0.50–1.35)
GFR calc non Af Amer: 65 mL/min — ABNORMAL LOW (ref >90)
GFR, EST AFRICAN AMERICAN: 75 mL/min — AB (ref >90)
Glucose, Bld: 296 mg/dL — ABNORMAL HIGH (ref 70–99)
POTASSIUM: 3.7 mmol/L (ref 3.5–5.1)
Sodium: 140 mmol/L (ref 135–145)

## 2015-03-21 LAB — CBC
HEMATOCRIT: 29 % — AB (ref 39.0–52.0)
Hemoglobin: 9.5 g/dL — ABNORMAL LOW (ref 13.0–17.0)
MCH: 29 pg (ref 26.0–34.0)
MCHC: 32.8 g/dL (ref 30.0–36.0)
MCV: 88.4 fL (ref 78.0–100.0)
PLATELETS: 141 10*3/uL — AB (ref 150–400)
RBC: 3.28 MIL/uL — ABNORMAL LOW (ref 4.22–5.81)
RDW: 15.3 % (ref 11.5–15.5)
WBC: 6.4 10*3/uL (ref 4.0–10.5)

## 2015-03-21 LAB — GLUCOSE, CAPILLARY
GLUCOSE-CAPILLARY: 245 mg/dL — AB (ref 70–99)
GLUCOSE-CAPILLARY: 173 mg/dL — AB (ref 70–99)
Glucose-Capillary: 232 mg/dL — ABNORMAL HIGH (ref 70–99)
Glucose-Capillary: 256 mg/dL — ABNORMAL HIGH (ref 70–99)

## 2015-03-21 MED ORDER — INSULIN ASPART 100 UNIT/ML ~~LOC~~ SOLN
0.0000 [IU] | Freq: Every day | SUBCUTANEOUS | Status: DC
  Administered 2015-03-21: 3 [IU] via SUBCUTANEOUS

## 2015-03-21 MED ORDER — INSULIN ASPART 100 UNIT/ML ~~LOC~~ SOLN
0.0000 [IU] | Freq: Three times a day (TID) | SUBCUTANEOUS | Status: DC
  Administered 2015-03-22: 2 [IU] via SUBCUTANEOUS

## 2015-03-21 MED ORDER — COLLAGENASE 250 UNIT/GM EX OINT
TOPICAL_OINTMENT | CUTANEOUS | Status: DC
  Filled 2015-03-21 (×2): qty 30

## 2015-03-21 NOTE — Progress Notes (Signed)
CARDIAC REHAB PHASE I   PRE:  Rate/Rhythm: 61 SR 1 HB PACs  BP:  Supine: 131/56  Sitting:   Standing:    SaO2: 100% 2L   97% RA  MODE:  Ambulation: 340 ft   POST:  Rate/Rhythm: 87  BP:  Supine: 129/44  Sitting:  Standing:    SaO2: mainly 90-99%RA during walk 1110-1153 Monitored oxygen sats whole walk and pt walked 340 ft. Sat would drop to 88 for a second but then climb to as high as 99% without pt doing anything different.  Stopped once to rest. Did c/o SOB and back pain during walk. Sats consistently stayed above 90% with a few dips to 88 that did not last. Left pt on RA in bed and notified RN to see if they drop with sleep. Pt feels that oxygen would be beneficial for him as he stated he would be right back with SOB. Pt did not want me to review any ed as we had seen him in NOvember. He did know when to call with weight gain and edema but stated he came in with edema before and nothing was changed. Did not follow up with [place] [facility] as he stated he did not have time. Did not want to refer again. In bed resting when I left.    Graylon Good, RN BSN  03/21/2015 11:49 AM

## 2015-03-21 NOTE — Progress Notes (Signed)
Tech offered Pt. a bath, Pt. refused and stated he would like to wait until later.

## 2015-03-21 NOTE — Progress Notes (Signed)
Pt voices concern about his elevated CBG.  States that his "sugar goes up every time I come to the hospital, and no one listens to me."  Pt informed that he had to come off of his metformin x 48 hours when he had a heart cath, which could be contributing to his elevated CBGs.  Pt is also not receiving any long acting insulin which he does take at home.  Pt has been receiving SSI coverage with meals but none at bedtime.  Dr. Radford Pax informed of pt's concerns.  HS SSI coverage ordered and given per Dr. Theodosia Blender order.  Low CBGs on the day of admission were also noted.  Pt would like for MD to order a long acting insulin.  Dr. Radford Pax notified of pt's request.  No further orders at this time.  Will continue to monitor.  Will relay pt's concerns to oncoming nurse.  Jodell Cipro

## 2015-03-21 NOTE — Progress Notes (Signed)
Pt apparently indicating that he needs to have oxygen at bedtime. Apparently anxious that he will get more SOB when he gets discharged without the oxygen at bedtime. CR has seen the pt. Kindly talk with the pt/family to address the issue. thanks

## 2015-03-21 NOTE — Progress Notes (Signed)
Patient Name: Victor [name] Date of Encounter: 03/21/2015  Primary Cardiologist: Dr. Martinique   Active Problems:   Type 2 diabetes mellitus   Cardiomyopathy, ischemic   Automatic implantable cardioverter-defibrillator in situ   Anemia-chronic\   Coronary artery disease   Acute combined systolic and diastolic congestive heart failure    SUBJECTIVE  Still feel SOB today, no CP.   CURRENT MEDS . aspirin  81 mg Oral QHS  . aspirin  81 mg Oral Daily  . bisoprolol  2.5 mg Oral Daily  . clopidogrel  75 mg Oral Daily  . clopidogrel  75 mg Oral Q breakfast  . furosemide  40 mg Oral BID  . heparin  5,000 Units Subcutaneous 3 times per day  . insulin aspart  0-9 Units Subcutaneous TID WC  . isosorbide mononitrate  15 mg Oral Daily  . lisinopril  2.5 mg Oral Daily  . pravastatin  40 mg Oral QHS  . pregabalin  150 mg Oral BID  . sodium chloride  3 mL Intravenous Q12H    OBJECTIVE  Filed Vitals:   03/20/15 2200 03/20/15 2230 03/21/15 0000 03/21/15 0400  BP: 98/31 121/24 99/76 124/49  Pulse: 97 69 64 63  Temp:   98 F (36.7 C) 98.5 F (36.9 C)  TempSrc:   Oral Oral  Resp: 14 16 16 16   Height:      Weight:      SpO2: 99% 93% 93% 97%    Intake/Output Summary (Last 24 hours) at 03/21/15 0643 Last data filed at 03/21/15 0600  Gross per 24 hour  Intake    240 ml  Output   2125 ml  Net  -1885 ml   Filed Weights   03/18/15 1243 03/19/15 0622 03/20/15 0518  Weight: 191 lb 12.8 oz (87 kg) 188 lb 4.4 oz (85.4 kg) 183 lb 8 oz (83.235 kg)    PHYSICAL EXAM  General: Pleasant, NAD. Neuro: Alert and oriented X 3. Moves all extremities spontaneously. Psych: Normal affect. HEENT:  Normal  Neck: Supple without bruits. Lungs:  Resp regular and unlabored. L basilar rale, mild rhonchi. Heart: RRR no s3, s4, or murmurs. Abdomen: Soft, non-tender, non-distended, BS + x 4.  Extremities: No clubbing, cyanosis. DP/PT/Radials 2+ and equal bilaterally. 1+ pitting edema in dependent  leg.  Accessory Clinical Findings  CBC  Recent Labs  03/18/15 1540  03/20/15 0442 03/21/15 0330  WBC 10.6*  < > 5.9 6.4  NEUTROABS 8.0*  --   --   --   HGB 10.4*  < > 9.4* 9.5*  HCT 32.1*  < > 28.4* 29.0*  MCV 88.9  < > 88.5 88.4  PLT 172  < > 139* 141*  < > = values in this interval not displayed. Basic Metabolic Panel  Recent Labs  03/20/15 0442 03/21/15 0330  NA 140 140  K 3.7 3.7  CL 101 102  CO2 31 30  GLUCOSE 173* 296*  BUN 22 14  CREATININE 1.27 1.07  CALCIUM 8.2* 8.1*   Liver Function Tests  Recent Labs  03/18/15 1540  AST 23  ALT 22  ALKPHOS 59  BILITOT 0.7  PROT 6.0  ALBUMIN 3.6   Hemoglobin A1C  Recent Labs  03/19/15 1010  HGBA1C 7.6*    TELE NSR with 1st degree AV block, PACs and PVCs    ECG  Sinus rhythm with 1st degree AV block  Echocardiogram 03/19/2015  LV EF: 35% -  40%  ------------------------------------------------------------------- Indications:  CHF - 428.0.  ------------------------------------------------------------------- History:  Risk factors: Former tobacco use. Hypertension. Diabetes mellitus.  ------------------------------------------------------------------- Study Conclusions  - Left ventricle: The cavity size was normal. Systolic function was moderately reduced. The estimated ejection fraction was in the range of 35% to 40%. Diffuse hypokinesis. There is akinesis of the basalinferior myocardium. There is hypokinesis of the inferolateral myocardium. - Aortic valve: Trileaflet; normal thickness, mildly calcified leaflets. - Mitral valve: Calcified annulus. Mild calcification. There was mild regurgitation. - Left atrium: The atrium was mildly dilated. - Pulmonary arteries: PA peak pressure: 48 mm Hg (S). - Pericardium, extracardiac: A trivial pericardial effusion was identified posterior to the heart.  Impressions:  - The right ventricular systolic pressure was increased  consistent with moderate pulmonary hypertension.     Radiology/Studies  Ct Chest Wo Contrast  03/11/2015   CLINICAL DATA:  79 year old male with shortness of breath and bilateral lower extremity swelling. Initial encounter.  EXAM: CT CHEST WITHOUT CONTRAST  TECHNIQUE: Multidetector CT imaging of the chest was performed following the standard protocol without IV contrast.  COMPARISON:  Portable chest radiographs 0910 hours today and earlier. Lumbar spine CT 03/04/2015.  FINDINGS: Left chest cardiac AICD. Major airways are patent. Cardiomegaly. Extensive aortic and coronary artery calcified plaque. No pericardial effusion. No mediastinal or hilar lymphadenopathy identified.  No axillary lymphadenopathy. Negative thoracic inlet aside from proximal great vessel calcified atherosclerosis.  Epicardial pacer leads partially visible in the epigastrium tracking to the ventral left abdominal wall.  Cholelithiasis. No pericholecystic fluid. Negative visible non contrast liver, spleen, pancreas, adrenal glands, kidneys, and bowel in the upper abdomen.  Major airways are patent. No pleural effusion. No consolidation. Numerous small bilateral pulmonary nodules in the periphery of both lungs, up to 5 mm in size. Some of these are measured and annotated on series 205. Mild thickening and scarring associated with the confluence of the right major and minor fissures best seen on coronal image 207. Mild mosaic attenuation is evident in the lungs on coronal image 269.  No acute osseous abnormality identified.  Prior median sternotomy.  IMPRESSION: 1. Multiple small bilateral pulmonary nodules which likely are postinflammatory. Follow-up chest CT at 1 year is recommended. This recommendation follows the consensus statement: Guidelines for Management of Small Pulmonary Nodules Detected on CT Scans: A Statement from the [place] as published in Radiology 2005; 237:395-400. 2. Mild mosaic attenuation in the lungs which  could reflect chronic pulmonary small airway or small vessel disease. 3. Cardiomegaly with severe calcified atherosclerosis. No pericardial or pleural effusion. 4. Cholelithiasis.   Electronically Signed   By: Genevie Ann M.D.   On: 03/11/2015 10:35   Ct Angio Chest Pe W/cm &/or Wo Cm  03/11/2015   CLINICAL DATA:  Shortness of breath, bilateral leg swelling  EXAM: CT ANGIOGRAPHY CHEST WITH CONTRAST  TECHNIQUE: Multidetector CT imaging of the chest was performed using the standard protocol during bolus administration of intravenous contrast. Multiplanar CT image reconstructions and MIPs were obtained to evaluate the vascular anatomy.  CONTRAST:  140mL OMNIPAQUE IOHEXOL 350 MG/ML SOLN  COMPARISON:  03/11/2015  FINDINGS: There is adequate opacification of the pulmonary arteries. There is no pulmonary embolus. The main pulmonary artery, right main pulmonary artery and left main pulmonary arteries are normal in size. The heart size is mildly enlarged. There is a defibrillator pad overlying the left ventricle. There is a single lead cardiac pacer present. There is no pericardial effusion. There is evidence of prior CABG.  There is no focal consolidation,  pleural effusion or pneumothorax. There is a for right upper lobe pulmonary nodule. There is mild bibasilar atelectasis. There is a 4 mm left upper lobe pulmonary nodule. There is a 4 mm right lower lobe pulmonary nodule.  There is no axillary, hilar, or mediastinal adenopathy.  There is no lytic or blastic osseous lesion.  There are cholelithiasis. There is severe atherosclerotic plaque at the origin of the SMA.  Review of the MIP images confirms the above findings.  IMPRESSION: 1. No evidence of pulmonary embolus. 2. Bilateral 4 mm pulmonary nodules. If the patient is at high risk for bronchogenic carcinoma, follow-up chest CT at 1 year is recommended. If the patient is at low risk, no follow-up is needed. This recommendation follows the consensus statement: Guidelines  for Management of Small Pulmonary Nodules Detected on CT Scans: A Statement from the [place] as published in Radiology 2005; 237:395-400.   Electronically Signed   By: Kathreen Devoid   On: 03/11/2015 16:28   Ct Lumbar Spine Wo Contrast  03/04/2015   CLINICAL DATA:  Chronic low back pain with bilateral leg pain and weakness, worsened over the past several weeks. Recurrent falls.  EXAM: CT LUMBAR SPINE WITHOUT CONTRAST  TECHNIQUE: Multidetector CT imaging of the lumbar spine was performed without intravenous contrast administration. Multiplanar CT image reconstructions were also generated.  COMPARISON:  CT lumbar spine 07/26/2012.  FINDINGS: There is convex left lumbar scoliosis. Vertebral body height and alignment are maintained. Imaged intra-abdominal contents demonstrate extensive aortoiliac atherosclerosis without aneurysm.  T12-L1:  Negative.  L1-2:  Negative.  L2-3: A disc bulge eccentric to the right is not changed. Mild ligamentum flavum thickening is seen. There is mild central canal and right foraminal narrowing. The left foramen is open.  L3-4: Shallow disc bulge, ligamentum flavum thickening and facet arthropathy are identified. Moderate to moderately severe foraminal narrowing appears worse on the right. The central canal is mildly narrowed. Degenerative disease shows mild progression since the prior study.  L4-5: Broad-based disc bulge, facet arthropathy and ligamentum flavum thickening are identified. Moderately severe facet degenerative disease is identified. There is moderate central canal stenosis. Moderate to moderately severe foraminal narrowing, worse on the left is identified. The appearance is unchanged.  L5-S1:  Negative.  IMPRESSION: Spondylosis appearing worst at L4-5 where there is moderate central canal narrowing and moderately severe to severe foraminal narrowing, worse on the left.  Some progression of spondylosis at L3-4 where there is moderate to moderately severe foraminal  narrowing, worse on the right, which has progressed since the prior study. Mild central canal stenosis at this level does not appear changed.   Electronically Signed   By: Inge Rise M.D.   On: 03/04/2015 14:13   Dg Chest Port 1 View  03/11/2015   CLINICAL DATA:  Short of breath.  EXAM: PORTABLE CHEST - 1 VIEW  COMPARISON:  09/30/2014  FINDINGS: Prior CABG.  AICD unchanged.  Negative for heart failure.  Hypoventilation with mild bibasilar atelectasis. Negative for effusion.  IMPRESSION: Hypoventilation with mild bibasilar atelectasis.   Electronically Signed   By: Franchot Gallo M.D.   On: 03/11/2015 09:26    ASSESSMENT AND PLAN  79 y/o male with known CAD s/p CABG x 2 in 1990 with multiple PCIs since that time. Last [facility] 10/01/14, he underwent CTO PCI of the RCA. Also with chronic combined systolic + diastolic CHF with EF of 24% on last echo 09/2014. H/o VT S/p ICD, T2DM, HTN, HLD and PAD with carotid  disease. Admitted from the office to [facility] 03/18/15 for IV diuretic therapy for acute on chronic systolic + diastolic CHF with dyspnea and volume overload. Also with new EKG changes on EKG with lateral T wave abnormalities. Admitted for L&RHC after diuresis, failed attempt at PCI of RCA,   1. Acute on chronic combined systolic and diastolic HF  - s/p IV diuresis, normal wedge pressure on RHC 4/29  - continue ASA, plavix, lisinopril, Imdur, satin and lasix.   - nurse to walk the patient and check O2 sat, can either discharge with 40mg  BID lasix with emphasis on Na restriction or increase it to 60mg  BID. If O2 sat stable and does not meet criteria for home O2, likely can be discharged either later today or tomorrow   2. Progressive dyspnea concerning for anginal equivalent with new T wave abnormality on EKG  - s/p RHC 03/20/2015 PCWP 37mmHg, CI 2.67, CO 5.26. LHC chronic occlusion of OMs, 90% prox RCA stenosis with mid RCA occlusion, failed attempt at PCI of RCA due to inability to cross wire.  Recommended continue medical therapy. Dr. Martinique to review with Dr. Irish Lack regarding further options for CTO PCI  3. CAD s/p CABG x2 in 1990 at [facility]  - cath 04/2013 occluded SVG to RCA, severe stenosis of SVG to OM treated with stent  - cath 07/2014 occlusion of SVG to OM  - cath 10/01/2014 s/p CTO PCI of RCA, difficult procedure with resultant dissection by restoration of TIMI 3 flow  4. Chronic ischemic cardiomyopathy s/p ICD placement with baseline EF 35%  - repeat echo 03/19/2015 EF 35-40%, diffuse hypokinesis with akinesis of basalinferior myocardium, mild MR, PA peak pressure 72mmHg  5. Acute renal insufficiency  6. DM  Signed, Almyra Deforest PA-C Pager: [phone]  Attending note Patient seen and discussed with PA Eulas Post, I agree with his documentation. Acute on chronic combined  Systolic/diastolic HF, LVEF 52-84%. Negative 5.4 liters since admission, Cr and BUN remain stable, currently on lasix 40mg  bid. S/p cath this admit, PCWP 8, mean PA 19. Unsuccesful attempt at PCI of RCA, will continue medical therapy. Still with SOB this AM. Mild LE edema and JVD, however wedge pressure of 8 on recent RHC. Will send for PA/Lateral CXR, transfer to tele bed today and continue to follow.   Zandra Abts MD

## 2015-03-22 DIAGNOSIS — I255 Ischemic cardiomyopathy: Secondary | ICD-10-CM

## 2015-03-22 DIAGNOSIS — I25119 Atherosclerotic heart disease of native coronary artery with unspecified angina pectoris: Secondary | ICD-10-CM

## 2015-03-22 LAB — GLUCOSE, CAPILLARY
Glucose-Capillary: 173 mg/dL — ABNORMAL HIGH (ref 70–99)
Glucose-Capillary: 230 mg/dL — ABNORMAL HIGH (ref 70–99)

## 2015-03-22 MED ORDER — FUROSEMIDE 40 MG PO TABS: ORAL_TABLET | ORAL | Status: DC

## 2015-03-22 MED ORDER — POTASSIUM CHLORIDE 20 MEQ PO PACK: 20.0000 meq | PACK | Freq: Two times a day (BID) | ORAL | Status: DC

## 2015-03-22 NOTE — Discharge Summary (Signed)
Discharge Summary   Patient ID: Victor [name],  MRN: [id number], DOB/AGE: 1936-07-29 79 y.o.  Admit date: 03/18/2015 Discharge date: 03/22/2015  Primary Care Provider: [facility] Primary Cardiologist: Dr. Martinique  Discharge Diagnoses Principal Problem:   Acute combined systolic and diastolic congestive heart failure Active Problems:   Type 2 diabetes mellitus   Cardiomyopathy, ischemic   Automatic implantable cardioverter-defibrillator in situ   Anemia-chronic\   Coronary artery disease   Allergies Allergies  Allergen Reactions  . Coreg [Carvedilol] Other (See Comments)    Shortness of breath ,fatigue ,dizzyness   . Lovastatin Other (See Comments)    CK elevation, Myalgias  . Ativan [Lorazepam] Other (See Comments)    Pt. Had side effects    Procedures  Echocardiogram 03/19/2015 LV EF: 35% -  40%  ------------------------------------------------------------------- Indications:   CHF - 428.0.  ------------------------------------------------------------------- History:  Risk factors: Former tobacco use. Hypertension. Diabetes mellitus.  ------------------------------------------------------------------- Study Conclusions  - Left ventricle: The cavity size was normal. Systolic function was moderately reduced. The estimated ejection fraction was in the range of 35% to 40%. Diffuse hypokinesis. There is akinesis of the basalinferior myocardium. There is hypokinesis of the inferolateral myocardium. - Aortic valve: Trileaflet; normal thickness, mildly calcified leaflets. - Mitral valve: Calcified annulus. Mild calcification. There was mild regurgitation. - Left atrium: The atrium was mildly dilated. - Pulmonary arteries: PA peak pressure: 48 mm Hg (S). - Pericardium, extracardiac: A trivial pericardial effusion was identified posterior to the heart.  Impressions:  - The right ventricular systolic pressure was increased consistent with  moderate pulmonary hypertension.    Cardiac catheterization 03/20/2015 Procedure: Right Cath, Selective Coronary Angiography, LV angiography, PTCA attempt and IVUS of the RCA  PROCEDURAL FINDINGS Hemodynamics: AO 104/42 mean 67 mm Hg RA 6/6 mean 3 mm Hg RV 31/8 mm Hg PA 35/8 mean 19 mm Hg PCWP 11/8 mean 8 mm Hg  Sats: PA 59% Ao 98%  Cardiac output: 5.26 L/min  Cardiac index 2.67 L/min/meter squared. Coronary angiography: Coronary dominance: right  PCI Data: Vessel - RCA /Segment - mid  Percent Stenosis (pre) 1005 TIMI-flow 1 Unable to cross with a wire.  Percent Stenosis (post) 100% TIMI-flow (post) 1  Final Conclusions:  1. Severe 2 vessel obstructive CAD with chronic occlusion of OMs and RCA 2. Known occlusion of SVGs from prior caths. 3. Normal right heart and LV filling pressures. 4. Unsuccessful attempt at PCI of the RCA due to inability to cross occlusion in the mid vessel after the RV marginal branch.  Recommendations: Continue medical therapy. Will review with Dr. Irish Lack to see if there are any further options for CTO PCI. If stable patient can be DC this weekend.     Hospital Course  The patient is a 79 year old male with PMH of chronic systolic heart failure, ICM, CAD s/p CABG 2 in 1990 at [facility]. He presented with unstable angina in June 2014, cath at the time showed occluded SVG to RCA, there was a CTO of native RCA, severe stenosis in SVG to OM which was successfully stented. He had abnormal stress test and progressive angina in September 2015, he underwent a repeat cardiac catheterization which showed occlusion of SVG to OM. In November 2015, he underwent a CTO PCI of RCA which was a difficult procedure due to severe calcification of the vessel. Patient presented to the clinic on 03/18/2015, at which time he complained of progressive dyspnea for one week with lower extremity edema, abdominal fullness, nausea, orthopnea and paroxysmal nocturnal dyspnea.  He  reports medication compliance but not fully adherent to low sodium diet as he goes out to eat a lot. He was recently seen at [facility] ED on 03/11/2015 with same complaint. BNP elevated at 427. His d-dimer was elevated. CTA of the chest showed no PE. Troponin was negative 2. While in the clinic, he complained of continuous symptoms despite increasing his diuretic. He was chest pain-free, however EKG showed a normal sinus rhythm with first degree AV block and a new T wave abnormality concerning for lateral ischemia.   Patient was admitted from cardiology office for acute on chronic combined systolic and diastolic heart failure. Given his EKG changes, he also needed ischemic workup. It was felt that his angina equivalent was dyspnea. Echocardiogram was obtained on 03/11/2015 which showed EF 35-40%, diffuse hypokinesis with akinesis of basal inferior myocardium, hypokinesis of the inferolateral myocardium, mild MR, PA peak pressure 48 mmHg. After several days of IV diuresis, patient underwent left and right heart cath on 03/20/2015. Right heart cath showed wedge pressure 8, PA pressure 35/8 with mean of 19, cardiac index 2.67, cardiac output 5.26. Left heart cath showed chronic occlusion of OMs, 90% proximal RCA stenosis with mid RCA occlusion, attempt was made at PCI of RCA, however failed due to inability to cross wire. Medical therapy was recommended. Per Report, Dr. Martinique will review with Dr. Irish Lack regarding further options for CTO PCI in the future.   Patient was seen in the morning of 03/21/2015, at which time he appears to be euvolemic despite complaining of shortness breath. A PA and lateral chest x-ray was obtained which did not reveal any sign of pulmonary edema. He was kept overnight for observation. He was seen in the morning of 03/22/2015, at which time he denies any symptoms significant shortness breath or chest discomfort. He did use oxygen at night during sleep which seems to help with his symptom.  Unfortunately, at this time patient does not qualify for home oxygen. I have talked with the case manager, who recommended for the patient to talk to his PCP in order to obtain sleep study for further evaluation of nocturnal O2 saturation drop. Patient is deemed stable for discharge from cardiology perspective. Further emphasis has been placed on importance of sodium restriction, fluid restriction, and daily weight measurement. Patient has been instructed to contact cardiology if his weight increased by more than 3 pounds overnight or 5 pounds in a single week. His Lasix has been increased to 60 mg in the morning and 40 mg at night. I will arrange follow-up with Dr. Doug Sou PA in the clinic in 2 weeks. Patient has been instructed to restart metformin on 03/23/2015 48 hrs. after cardiac cath.   The patient's current dry weight is 185 lbs.    Discharge Vitals Blood pressure 145/46, pulse 77, temperature 98.1 F (36.7 C), temperature source Oral, resp. rate 20, height 5\' 8"  (1.727 m), weight 185 lb 12 oz (84.256 kg), SpO2 100 %.  Filed Weights   03/20/15 0518 03/21/15 0400 03/22/15 0607  Weight: 183 lb 8 oz (83.235 kg) 185 lb 13.6 oz (84.3 kg) 185 lb 12 oz (84.256 kg)    Labs  CBC  Recent Labs  03/20/15 0442 03/21/15 0330  WBC 5.9 6.4  HGB 9.4* 9.5*  HCT 28.4* 29.0*  MCV 88.5 88.4  PLT 139* 106*   Basic Metabolic Panel  Recent Labs  03/20/15 0442 03/21/15 0330  NA 140 140  K 3.7 3.7  CL 101 102  CO2 31 30  GLUCOSE 173* 296*  BUN 22 14  CREATININE 1.27 1.07  CALCIUM 8.2* 8.1*    Disposition  Pt is being discharged home today in good condition.  Follow-up Plans & Appointments      Follow-up Information    Follow up with Peter Martinique, MD On 03/31/2015.   Specialty:  Cardiology   Why:  2:25pm   Contact information:   [address] [place] [address] [phone]       Follow up with Leonides Sake, MD.   Specialty:  Family Medicine   Why:  Please  discuss with your PCP to arrange outpatient sleep study   Contact information:   Dr. Daiva E[address] [place] [address] [phone]       Discharge Medications    Medication List    TAKE these medications        ASPIR-81 81 MG EC tablet  Generic drug:  aspirin  Take 81 mg by mouth at bedtime.     bisoprolol 5 MG tablet  Commonly known as:  ZEBETA  Take 0.5 tablets (2.5 mg total) by mouth daily.     clopidogrel 75 MG tablet  Commonly known as:  PLAVIX  TAKE 1 TABLET EVERY DAY WITH BREAKFAST     dexamethasone 4 MG tablet  Commonly known as:  DECADRON  Take 4 mg by mouth daily as needed.     etodolac 400 MG tablet  Commonly known as:  LODINE  Take 400 mg by mouth daily.     furosemide 40 MG tablet  Commonly known as:  LASIX  60mg  in AM and 40mg  in PM     HYDROcodone-acetaminophen 10-325 MG per tablet  Commonly known as:  NORCO  Take 1 tablet by mouth every 8 (eight) hours as needed for moderate pain.     isosorbide mononitrate 30 MG 24 hr tablet  Commonly known as:  IMDUR  Take 15 mg by mouth daily.     lisinopril 2.5 MG tablet  Commonly known as:  PRINIVIL,ZESTRIL  Take 1 tablet (2.5 mg total) by mouth daily.     LYRICA 150 MG capsule  Generic drug:  pregabalin  Take 150 mg by mouth 2 (two) times daily.     metFORMIN 1000 MG tablet  Commonly known as:  GLUCOPHAGE  Take 1,000 mg by mouth 2 (two) times daily with a meal.     nitroGLYCERIN 0.4 MG SL tablet  Commonly known as:  NITROSTAT  Place 0.4 mg under the tongue every 5 (five) minutes as needed for chest pain.     Oxycodone HCl 10 MG Tabs  Take 10 mg by mouth every 8 (eight) hours as needed (moderate pain).     potassium chloride 20 MEQ packet  Commonly known as:  KLOR-CON  Take 20 mEq by mouth 2 (two) times daily.     pravastatin 40 MG tablet  Commonly known as:  PRAVACHOL  Take 40 mg by mouth at bedtime.     SANTYL ointment  Generic drug:  collagenase  Apply 1  application topically every other day.     TOUJEO SOLOSTAR [place]  Inject 30 Units into the skin every morning.     vitamin B-12 1000 MCG tablet  Commonly known as:  CYANOCOBALAMIN  Take 1,000 mcg by mouth daily.        Duration of Discharge Encounter   Greater than 30 minutes including physician time.  Signed, Almyra Deforest PA-C Pager:  [phone] 03/22/2015, 12:15 PM

## 2015-03-22 NOTE — Progress Notes (Signed)
Pt had ambulated on the hallway on Room Air. His initial RA saturation was at 98%. As pt is being ambulated his O2 saturation had ranged from 93% to 100 %. His last o2 saturation as his ambulation got completed was at 98%. PA-C Hao updated.

## 2015-03-22 NOTE — Progress Notes (Signed)
03/22/15 10:30 Cm received call from MD requesting home 02.  Unfortunately, pulmonary saturation test by RN (today) revealed pt doe not meet desat requirements for insurance to cover cost of home 02.  MD made aware and pt requesting of his PCP a sleep study.  Pt unwilling to pay out of pocket for hoe 02.  No other CM needs were communicated.  Mariane Masters, BSn, Cm [phone].

## 2015-03-22 NOTE — Progress Notes (Signed)
Patient Name: Victor [name] Date of Encounter: 03/22/2015  Primary Cardiologist: Dr. Martinique   Active Problems:   Type 2 diabetes mellitus   Cardiomyopathy, ischemic   Automatic implantable cardioverter-defibrillator in situ   Anemia-chronic\   Coronary artery disease   Acute combined systolic and diastolic congestive heart failure    SUBJECTIVE  Denies any CP, concerned about his blood sugar yesterday, however understand that because we had to cath him on 4/29, he will need to hold his metformin until 03/23/2015. Did have very good sleep last night, however according to the patient, only because he wore O2, he will wish to obtain home O2 despite yesterday's reading being borderline.   CURRENT MEDS . aspirin  81 mg Oral QHS  . aspirin  81 mg Oral Daily  . bisoprolol  2.5 mg Oral Daily  . clopidogrel  75 mg Oral Daily  . clopidogrel  75 mg Oral Q breakfast  . collagenase   Topical QODAY  . furosemide  40 mg Oral BID  . heparin  5,000 Units Subcutaneous 3 times per day  . insulin aspart  0-5 Units Subcutaneous QHS  . insulin aspart  0-9 Units Subcutaneous TID WC  . isosorbide mononitrate  15 mg Oral Daily  . lisinopril  2.5 mg Oral Daily  . pravastatin  40 mg Oral QHS  . pregabalin  150 mg Oral BID  . sodium chloride  3 mL Intravenous Q12H    OBJECTIVE  Filed Vitals:   03/21/15 0931 03/21/15 1819 03/21/15 2000 03/22/15 0607  BP: 130/45 140/56 107/36 126/34  Pulse: 66 59 61 77  Temp: 97.8 F (36.6 C) 98.3 F (36.8 C) 97.7 F (36.5 C) 98.1 F (36.7 C)  TempSrc: Oral Oral Oral Oral  Resp: 16 18 18 20   Height:      Weight:    185 lb 12 oz (84.256 kg)  SpO2: 100% 100% 99% 100%    Intake/Output Summary (Last 24 hours) at 03/22/15 0726 Last data filed at 03/22/15 1941  Gross per 24 hour  Intake      0 ml  Output    650 ml  Net   -650 ml   Filed Weights   03/20/15 0518 03/21/15 0400 03/22/15 0607  Weight: 183 lb 8 oz (83.235 kg) 185 lb 13.6 oz (84.3 kg) 185 lb 12  oz (84.256 kg)    PHYSICAL EXAM  General: Pleasant, NAD. Neuro: Alert and oriented X 3. Moves all extremities spontaneously. Psych: Normal affect. HEENT:  Normal  Neck: Supple without bruits. Mild JVD. Lungs:  Resp regular and unlabored. Persistent R basilar rale. Heart: regularly irregular. no s3, s4, or murmurs. Abdomen: Soft, non-tender, non-distended, BS + x 4.  Extremities: No clubbing, cyanosis. DP/PT/Radials 2+ and equal bilaterally. 1+ pitting edema  Accessory Clinical Findings  CBC  Recent Labs  03/20/15 0442 03/21/15 0330  WBC 5.9 6.4  HGB 9.4* 9.5*  HCT 28.4* 29.0*  MCV 88.5 88.4  PLT 139* 740*   Basic Metabolic Panel  Recent Labs  03/20/15 0442 03/21/15 0330  NA 140 140  K 3.7 3.7  CL 101 102  CO2 31 30  GLUCOSE 173* 296*  BUN 22 14  CREATININE 1.27 1.07  CALCIUM 8.2* 8.1*   Hemoglobin A1C  Recent Labs  03/19/15 1010  HGBA1C 7.6*    TELE NSR with 1st AV block, 8 beats run of NSVT around 3am, ?A-tach after 1 pm which is likely artifact    ECG  No  new EKG  Echocardiogram 03/19/2015  LV EF: 35% -  40%  ------------------------------------------------------------------- Indications:   CHF - 428.0.  ------------------------------------------------------------------- History:  Risk factors: Former tobacco use. Hypertension. Diabetes mellitus.  ------------------------------------------------------------------- Study Conclusions  - Left ventricle: The cavity size was normal. Systolic function was moderately reduced. The estimated ejection fraction was in the range of 35% to 40%. Diffuse hypokinesis. There is akinesis of the basalinferior myocardium. There is hypokinesis of the inferolateral myocardium. - Aortic valve: Trileaflet; normal thickness, mildly calcified leaflets. - Mitral valve: Calcified annulus. Mild calcification. There was mild regurgitation. - Left atrium: The atrium was mildly dilated. -  Pulmonary arteries: PA peak pressure: 48 mm Hg (S). - Pericardium, extracardiac: A trivial pericardial effusion was identified posterior to the heart.  Impressions:  - The right ventricular systolic pressure was increased consistent with moderate pulmonary hypertension.    Radiology/Studies  Dg Chest 2 View  03/21/2015   CLINICAL DATA:  Cardiac catheterization yesterday. Shortness of breath. History of CAD, hypertension, CHF.  EXAM: CHEST  2 VIEW  COMPARISON:  03/11/2015  FINDINGS: Left-sided AICD lead overlies the right ventricle. Epicardial pacing leads are noted. Status post median sternotomy and CABG. Heart is enlarged. No pulmonary edema. Lungs are clear.  IMPRESSION: 1. Cardiomegaly without pulmonary edema. 2.  No focal acute pulmonary abnormality.   Electronically Signed   By: Nolon Nations M.D.   On: 03/21/2015 13:32   Ct Chest Wo Contrast  03/11/2015   CLINICAL DATA:  79 year old male with shortness of breath and bilateral lower extremity swelling. Initial encounter.  EXAM: CT CHEST WITHOUT CONTRAST  TECHNIQUE: Multidetector CT imaging of the chest was performed following the standard protocol without IV contrast.  COMPARISON:  Portable chest radiographs 0910 hours today and earlier. Lumbar spine CT 03/04/2015.  FINDINGS: Left chest cardiac AICD. Major airways are patent. Cardiomegaly. Extensive aortic and coronary artery calcified plaque. No pericardial effusion. No mediastinal or hilar lymphadenopathy identified.  No axillary lymphadenopathy. Negative thoracic inlet aside from proximal great vessel calcified atherosclerosis.  Epicardial pacer leads partially visible in the epigastrium tracking to the ventral left abdominal wall.  Cholelithiasis. No pericholecystic fluid. Negative visible non contrast liver, spleen, pancreas, adrenal glands, kidneys, and bowel in the upper abdomen.  Major airways are patent. No pleural effusion. No consolidation. Numerous small bilateral pulmonary  nodules in the periphery of both lungs, up to 5 mm in size. Some of these are measured and annotated on series 205. Mild thickening and scarring associated with the confluence of the right major and minor fissures best seen on coronal image 207. Mild mosaic attenuation is evident in the lungs on coronal image 269.  No acute osseous abnormality identified.  Prior median sternotomy.  IMPRESSION: 1. Multiple small bilateral pulmonary nodules which likely are postinflammatory. Follow-up chest CT at 1 year is recommended. This recommendation follows the consensus statement: Guidelines for Management of Small Pulmonary Nodules Detected on CT Scans: A Statement from the [employer] [place] as published in Radiology 2005; 237:395-400. 2. Mild mosaic attenuation in the lungs which could reflect chronic pulmonary small airway or small vessel disease. 3. Cardiomegaly with severe calcified atherosclerosis. No pericardial or pleural effusion. 4. Cholelithiasis.   Electronically Signed   By: Genevie Ann M.D.   On: 03/11/2015 10:35   Ct Angio Chest Pe W/cm &/or Wo Cm  03/11/2015   CLINICAL DATA:  Shortness of breath, bilateral leg swelling  EXAM: CT ANGIOGRAPHY CHEST WITH CONTRAST  TECHNIQUE: Multidetector CT imaging of the chest was  performed using the standard protocol during bolus administration of intravenous contrast. Multiplanar CT image reconstructions and MIPs were obtained to evaluate the vascular anatomy.  CONTRAST:  160mL OMNIPAQUE IOHEXOL 350 MG/ML SOLN  COMPARISON:  03/11/2015  FINDINGS: There is adequate opacification of the pulmonary arteries. There is no pulmonary embolus. The main pulmonary artery, right main pulmonary artery and left main pulmonary arteries are normal in size. The heart size is mildly enlarged. There is a defibrillator pad overlying the left ventricle. There is a single lead cardiac pacer present. There is no pericardial effusion. There is evidence of prior CABG.  There is no focal consolidation,  pleural effusion or pneumothorax. There is a for right upper lobe pulmonary nodule. There is mild bibasilar atelectasis. There is a 4 mm left upper lobe pulmonary nodule. There is a 4 mm right lower lobe pulmonary nodule.  There is no axillary, hilar, or mediastinal adenopathy.  There is no lytic or blastic osseous lesion.  There are cholelithiasis. There is severe atherosclerotic plaque at the origin of the SMA.  Review of the MIP images confirms the above findings.  IMPRESSION: 1. No evidence of pulmonary embolus. 2. Bilateral 4 mm pulmonary nodules. If the patient is at high risk for bronchogenic carcinoma, follow-up chest CT at 1 year is recommended. If the patient is at low risk, no follow-up is needed. This recommendation follows the consensus statement: Guidelines for Management of Small Pulmonary Nodules Detected on CT Scans: A Statement from the [place] as published in Radiology 2005; 237:395-400.   Electronically Signed   By: Kathreen Devoid   On: 03/11/2015 16:28   Ct Lumbar Spine Wo Contrast  03/04/2015   CLINICAL DATA:  Chronic low back pain with bilateral leg pain and weakness, worsened over the past several weeks. Recurrent falls.  EXAM: CT LUMBAR SPINE WITHOUT CONTRAST  TECHNIQUE: Multidetector CT imaging of the lumbar spine was performed without intravenous contrast administration. Multiplanar CT image reconstructions were also generated.  COMPARISON:  CT lumbar spine 07/26/2012.  FINDINGS: There is convex left lumbar scoliosis. Vertebral body height and alignment are maintained. Imaged intra-abdominal contents demonstrate extensive aortoiliac atherosclerosis without aneurysm.  T12-L1:  Negative.  L1-2:  Negative.  L2-3: A disc bulge eccentric to the right is not changed. Mild ligamentum flavum thickening is seen. There is mild central canal and right foraminal narrowing. The left foramen is open.  L3-4: Shallow disc bulge, ligamentum flavum thickening and facet arthropathy are  identified. Moderate to moderately severe foraminal narrowing appears worse on the right. The central canal is mildly narrowed. Degenerative disease shows mild progression since the prior study.  L4-5: Broad-based disc bulge, facet arthropathy and ligamentum flavum thickening are identified. Moderately severe facet degenerative disease is identified. There is moderate central canal stenosis. Moderate to moderately severe foraminal narrowing, worse on the left is identified. The appearance is unchanged.  L5-S1:  Negative.  IMPRESSION: Spondylosis appearing worst at L4-5 where there is moderate central canal narrowing and moderately severe to severe foraminal narrowing, worse on the left.  Some progression of spondylosis at L3-4 where there is moderate to moderately severe foraminal narrowing, worse on the right, which has progressed since the prior study. Mild central canal stenosis at this level does not appear changed.   Electronically Signed   By: Inge Rise M.D.   On: 03/04/2015 14:13   Dg Chest Port 1 View  03/11/2015   CLINICAL DATA:  Short of breath.  EXAM: PORTABLE CHEST - 1 VIEW  COMPARISON:  09/30/2014  FINDINGS: Prior CABG.  AICD unchanged.  Negative for heart failure.  Hypoventilation with mild bibasilar atelectasis. Negative for effusion.  IMPRESSION: Hypoventilation with mild bibasilar atelectasis.   Electronically Signed   By: Franchot Gallo M.D.   On: 03/11/2015 09:26    ASSESSMENT AND PLAN  79 y/o male with known CAD s/p CABG x 2 in 1990 with multiple PCIs since that time. Last [facility] 10/01/14, he underwent CTO PCI of the RCA. Also with chronic combined systolic + diastolic CHF with EF of 22% on last echo 09/2014. H/o VT S/p ICD, T2DM, HTN, HLD and PAD with carotid disease. Admitted from the office to [facility] 03/18/15 for IV diuretic therapy for acute on chronic systolic + diastolic CHF with dyspnea and volume overload. Also with new EKG changes on EKG with lateral T wave abnormalities.  Admitted for L&RHC after diuresis, failed attempt at PCI of RCA,   1. Acute on chronic combined systolic and diastolic HF - s/p IV diuresis, normal wedge pressure on RHC 4/29 - continue ASA, plavix, lisinopril, Imdur, satin and lasix.  - ambulated yesterday, O2 only briefly dropped down to 88% however quickly bounced back, patient still wish to obtain home O2 and is concerned he will be back without it. Can either discharge with 40mg  BID lasix with emphasis on Na restriction (lack of Na restriction appears to be one of the precipitating problem that lead to this admission) or increase it to 60mg  BID. PA/Lateral X ray did not reveal obvious edema despite having R basilar rale and mild JVD  - will discuss with MD regarding borderline reading for home O2, likely discharge today.   2. Progressive dyspnea concerning for anginal equivalent with new T wave abnormality on EKG - s/p RHC 03/20/2015 PCWP 60mmHg, CI 2.67, CO 5.26. LHC chronic occlusion of OMs, 90% prox RCA stenosis with mid RCA occlusion, failed attempt at PCI of RCA due to inability to cross wire. Recommended continue medical therapy. Dr. Martinique to review with Dr. Irish Lack regarding further options for CTO PCI  3. CAD s/p CABG x2 in 1990 at [place] - cath 04/2013 occluded SVG to RCA, severe stenosis of SVG to OM treated with stent - cath 07/2014 occlusion of SVG to OM - cath 10/01/2014 s/p CTO PCI of RCA, difficult procedure with resultant dissection by restoration of TIMI 3 flow  4. Chronic ischemic cardiomyopathy s/p ICD placement with baseline EF 35% - repeat echo 03/19/2015 EF 35-40%, diffuse hypokinesis with akinesis of basalinferior myocardium, mild MR, PA peak pressure 53mmHg  5. Acute renal insufficiency  6. DM  Signed, Almyra Deforest PA-C Pager: [phone]  Patient seen and discussed with [place], I agree with his documentation. Acute on chronic  combined Systolic/diastolic HF, LVEF 62-56%. Negative 6 liters since admission, Cr and BUN remain stable, currently on lasix 40mg  bid. On this dosing negative 477mL, I suspect his diet will change when he goes home and will likely need high daily dosing. Will change to lasix 60mg  in AM and 40mg  in PM. Cath this admit with unsuccesful PCI to RCA, will continue medical therapy. Had some SOB yesterday, recent RHC with PCWP of 8 and CXR yesterday with clear lungs, does not appear to be CHF related. Noted brief O2 sat of 88% with ambulation yesterday, likely will need home O2 for a transient period of time. Will need close f/u at discharge with Dr Martinique in 1-2 weeks.  Zandra Abts MD

## 2015-03-22 NOTE — Discharge Instructions (Signed)
Heart Failure °Heart failure means your heart has trouble pumping blood. This makes it hard for your body to work well. Heart failure is usually a long-term (chronic) condition. You must take good care of yourself and follow your doctor's treatment plan. °HOME CARE °· Take your heart medicine as told by your doctor. °¨ Do not stop taking medicine unless your doctor tells you to. °¨ Do not skip any dose of medicine. °¨ Refill your medicines before they run out. °¨ Take other medicines only as told by your doctor or pharmacist. °· Stay active if told by your doctor. The elderly and people with severe heart failure should talk with a doctor about physical activity. °· Eat heart-healthy foods. Choose foods that are without trans fat and are low in saturated fat, cholesterol, and salt (sodium). This includes fresh or frozen fruits and vegetables, fish, lean meats, fat-free or low-fat dairy foods, whole grains, and high-fiber foods. Lentils and dried peas and beans (legumes) are also good choices. °· Limit salt if told by your doctor. °· Cook in a healthy way. Roast, grill, broil, bake, poach, steam, or stir-fry foods. °· Limit fluids as told by your doctor. °· Weigh yourself every morning. Do this after you pee (urinate) and before you eat breakfast. Write down your weight to give to your doctor. °· Take your blood pressure and write it down if your doctor tells you to. °· Ask your doctor how to check your pulse. Check your pulse as told. °· Lose weight if told by your doctor. °· Stop smoking or chewing tobacco. Do not use gum or patches that help you quit without your doctor's approval. °· Schedule and go to doctor visits as told. °· Nonpregnant women should have no more than 1 drink a day. Men should have no more than 2 drinks a day. Talk to your doctor about drinking alcohol. °· Stop illegal drug use. °· Stay current with shots (immunizations). °· Manage your health conditions as told by your doctor. °· Learn to  manage your stress. °· Rest when you are tired. °· If it is really hot outside: °¨ Avoid intense activities. °¨ Use air conditioning or fans, or get in a cooler place. °¨ Avoid caffeine and alcohol. °¨ Wear loose-fitting, lightweight, and light-colored clothing. °· If it is really cold outside: °¨ Avoid intense activities. °¨ Layer your clothing. °¨ Wear mittens or gloves, a hat, and a scarf when going outside. °¨ Avoid alcohol. °· Learn about heart failure and get support as needed. °· Get help to maintain or improve your quality of life and your ability to care for yourself as needed. °GET HELP IF:  °· You gain 03 lb/1.4 kg or more in 1 day or 05 lb/2.3 kg in a week. °· You are more short of breath than usual. °· You cannot do your normal activities. °· You tire easily. °· You cough more than normal, especially with activity. °· You have any or more puffiness (swelling) in areas such as your hands, feet, ankles, or belly (abdomen). °· You cannot sleep because it is hard to breathe. °· You feel like your heart is beating fast (palpitations). °· You get dizzy or light-headed when you stand up. °GET HELP RIGHT AWAY IF:  °· You have trouble breathing. °· There is a change in mental status, such as becoming less alert or not being able to focus. °· You have chest pain or discomfort. °· You faint. °MAKE SURE YOU:  °· Understand these instructions. °·   Will watch your condition.  Will get help right away if you are not doing well or get worse. Document Released: 08/16/2008 Document Revised: 03/24/2014 Document Reviewed: 12/24/2012 [facility] Patient Information 2015 [place], [place]. This information is not intended to replace advice given to you by your health care provider. Make sure you discuss any questions you have with your health care provider.  In order to prevent recurrence of heart failure: 1. Limit fluid intake <2L per day  2. Limit Sodium/salty food intake 3. Measure your weight every morning, call  cardiology if weight increase by more than 3 lbs overnight or 5 lbs in a single week. Your current dry weight is 185 lbs.

## 2015-03-24 ENCOUNTER — Encounter (HOSPITAL_BASED_OUTPATIENT_CLINIC_OR_DEPARTMENT_OTHER): Payer: Medicare Other | Attending: General Surgery

## 2015-03-24 ENCOUNTER — Ambulatory Visit (INDEPENDENT_AMBULATORY_CARE_PROVIDER_SITE_OTHER): Payer: Medicare Other | Admitting: *Deleted

## 2015-03-24 DIAGNOSIS — Z8614 Personal history of Methicillin resistant Staphylococcus aureus infection: Secondary | ICD-10-CM | POA: Diagnosis not present

## 2015-03-24 DIAGNOSIS — L89029 Pressure ulcer of left elbow, unspecified stage: Secondary | ICD-10-CM | POA: Diagnosis not present

## 2015-03-24 DIAGNOSIS — Z794 Long term (current) use of insulin: Secondary | ICD-10-CM | POA: Diagnosis not present

## 2015-03-24 DIAGNOSIS — I472 Ventricular tachycardia: Secondary | ICD-10-CM | POA: Diagnosis not present

## 2015-03-24 DIAGNOSIS — L89019 Pressure ulcer of right elbow, unspecified stage: Secondary | ICD-10-CM | POA: Diagnosis not present

## 2015-03-24 DIAGNOSIS — E11622 Type 2 diabetes mellitus with other skin ulcer: Secondary | ICD-10-CM | POA: Diagnosis not present

## 2015-03-24 DIAGNOSIS — I4729 Other ventricular tachycardia: Secondary | ICD-10-CM

## 2015-03-24 NOTE — Progress Notes (Signed)
Remote ICD transmission.   

## 2015-03-25 DIAGNOSIS — M4806 Spinal stenosis, lumbar region: Secondary | ICD-10-CM | POA: Diagnosis not present

## 2015-03-28 LAB — CUP PACEART REMOTE DEVICE CHECK
Date Time Interrogation Session: 20160503133000
HighPow Impedance: 53 Ohm
Lead Channel Pacing Threshold Amplitude: 1 V
Lead Channel Sensing Intrinsic Amplitude: 12 mV
Lead Channel Setting Pacing Amplitude: 2.4 V
Lead Channel Setting Sensing Sensitivity: 0.4 mV
MDC IDC MSMT BATTERY REMAINING LONGEVITY: 126 mo
MDC IDC MSMT BATTERY REMAINING PERCENTAGE: 100 %
MDC IDC MSMT LEADCHNL RV IMPEDANCE VALUE: 482 Ohm
MDC IDC MSMT LEADCHNL RV PACING THRESHOLD PULSEWIDTH: 0.4 ms
MDC IDC PG SERIAL: 118170
MDC IDC SET LEADCHNL RV PACING PULSEWIDTH: 0.4 ms
MDC IDC SET ZONE DETECTION INTERVAL: 364 ms
MDC IDC STAT BRADY RV PERCENT PACED: 0 %
Zone Setting Detection Interval: 273 ms
Zone Setting Detection Interval: 308 ms

## 2015-03-31 ENCOUNTER — Ambulatory Visit: Payer: Medicare Other | Admitting: Cardiology

## 2015-04-02 ENCOUNTER — Encounter: Payer: Self-pay | Admitting: Cardiology

## 2015-04-06 ENCOUNTER — Other Ambulatory Visit: Payer: Self-pay | Admitting: Orthopedic Surgery

## 2015-04-06 DIAGNOSIS — M5136 Other intervertebral disc degeneration, lumbar region: Secondary | ICD-10-CM

## 2015-04-09 ENCOUNTER — Encounter: Payer: Self-pay | Admitting: Internal Medicine

## 2015-04-13 ENCOUNTER — Encounter: Payer: Self-pay | Admitting: Cardiology

## 2015-04-13 ENCOUNTER — Ambulatory Visit (INDEPENDENT_AMBULATORY_CARE_PROVIDER_SITE_OTHER): Payer: Medicare Other | Admitting: Cardiology

## 2015-04-13 VITALS — BP 138/70 | HR 72 | Ht 68.0 in | Wt 193.0 lb

## 2015-04-13 DIAGNOSIS — Z951 Presence of aortocoronary bypass graft: Secondary | ICD-10-CM

## 2015-04-13 DIAGNOSIS — Z79899 Other long term (current) drug therapy: Secondary | ICD-10-CM

## 2015-04-13 DIAGNOSIS — R5383 Other fatigue: Secondary | ICD-10-CM

## 2015-04-13 DIAGNOSIS — I5041 Acute combined systolic (congestive) and diastolic (congestive) heart failure: Secondary | ICD-10-CM | POA: Diagnosis not present

## 2015-04-13 DIAGNOSIS — I255 Ischemic cardiomyopathy: Secondary | ICD-10-CM | POA: Diagnosis not present

## 2015-04-13 DIAGNOSIS — Z95810 Presence of automatic (implantable) cardiac defibrillator: Secondary | ICD-10-CM | POA: Diagnosis not present

## 2015-04-13 LAB — CBC
HCT: 31.3 % — ABNORMAL LOW (ref 39.0–52.0)
Hemoglobin: 10.1 g/dL — ABNORMAL LOW (ref 13.0–17.0)
MCH: 28.8 pg (ref 26.0–34.0)
MCHC: 32.3 g/dL (ref 30.0–36.0)
MCV: 89.2 fL (ref 78.0–100.0)
MPV: 10.7 fL (ref 8.6–12.4)
Platelets: 181 10*3/uL (ref 150–400)
RBC: 3.51 MIL/uL — AB (ref 4.22–5.81)
RDW: 14.4 % (ref 11.5–15.5)
WBC: 9.8 10*3/uL (ref 4.0–10.5)

## 2015-04-13 LAB — BASIC METABOLIC PANEL
BUN: 30 mg/dL — AB (ref 6–23)
CO2: 32 meq/L (ref 19–32)
Calcium: 9.2 mg/dL (ref 8.4–10.5)
Chloride: 100 mEq/L (ref 96–112)
Creat: 1.13 mg/dL (ref 0.50–1.35)
Glucose, Bld: 275 mg/dL — ABNORMAL HIGH (ref 70–99)
POTASSIUM: 4.1 meq/L (ref 3.5–5.3)
SODIUM: 140 meq/L (ref 135–145)

## 2015-04-13 NOTE — Assessment & Plan Note (Signed)
s/p CABG 1990 with Occlusion of both VGs. S/p SVG-OM3 PCI June 2014 Cath Sept 2015- medical Rx  s/p PCI of CTO of RCA 10/05/14 Cath 03/20/15- unsuccessful PCI CTO RCA

## 2015-04-13 NOTE — Progress Notes (Signed)
04/13/2015 Victor [name]   10-Aug-1936  [id number]  Primary Physician Victor Sake, MD Primary Cardiologist: Dr Victor [name] Dr Victor [name]  HPI:  79 y/o male with known CAD s/p CABG x 2 in 1990 with multiple PCIs since that time. Last PCI was 10/01/14, he underwent CTO PCI of the RCA.  He also has chronic combined systolic + diastolic CHF with EF of 95% . He had VT in 2012 and is S/p BS ICD. Other problems include T2IDDM,CRI-3, HTN, HLD and PAD with carotid disease. He was admitted from the office to [facility] 03/18/15 for IV diuretic therapy for acute on chronic systolic + diastolic CHF with dyspnea and volume overload. Also with new EKG changes on EKG with lateral T wave abnormalities.He underwent coronary angiogram 03/20/15 and had unsuccessful attempt at PCI of CTO RCA. Plan is for medical Rx. Since discharge he has done OK from cardiac standpoint. He denies any increased in intermittent dyspnea. He does not have angina "never had chest pain". He tells me that O2 helps but he doesn't qualify for home O2.    Current Outpatient Prescriptions  Medication Sig Dispense Refill  . aspirin (ASPIR-81) 81 MG EC tablet Take 81 mg by mouth at bedtime.     . bisoprolol (ZEBETA) 5 MG tablet Take 0.5 tablets (2.5 mg total) by mouth daily. 45 tablet 3  . clopidogrel (PLAVIX) 75 MG tablet TAKE 1 TABLET EVERY DAY WITH BREAKFAST 90 tablet 3  . dexamethasone (DECADRON) 4 MG tablet Take 4 mg by mouth daily as needed.  0  . etodolac (LODINE) 400 MG tablet Take 400 mg by mouth daily.     . furosemide (LASIX) 40 MG tablet 60mg  in AM and 40mg  in PM 180 tablet 3  . HYDROcodone-acetaminophen (NORCO) 10-325 MG per tablet Take 1 tablet by mouth every 8 (eight) hours as needed for moderate pain.   0  . Insulin Glargine (TOUJEO SOLOSTAR [place]) Inject 30 Units into the skin every morning.    . isosorbide mononitrate (IMDUR) 30 MG 24 hr tablet Take 15 mg by mouth daily.  6  . lisinopril (PRINIVIL,ZESTRIL) 2.5 MG tablet Take 1 tablet  (2.5 mg total) by mouth daily. 90 tablet 2  . LYRICA 150 MG capsule Take 150 mg by mouth 2 (two) times daily.  1  . metFORMIN (GLUCOPHAGE) 1000 MG tablet Take 1,000 mg by mouth 2 (two) times daily with a meal.    . nitroGLYCERIN (NITROSTAT) 0.4 MG SL tablet Place 0.4 mg under the tongue every 5 (five) minutes as needed for chest pain.    . Oxycodone HCl 10 MG TABS Take 10 mg by mouth every 8 (eight) hours as needed (moderate pain).   0  . potassium chloride (KLOR-CON) 20 MEQ packet Take 20 mEq by mouth 2 (two) times daily. 90 tablet 3  . pravastatin (PRAVACHOL) 40 MG tablet Take 40 mg by mouth at bedtime.     Marland Kitchen SANTYL ointment Apply 1 application topically every other day.  2  . vitamin B-12 (CYANOCOBALAMIN) 1000 MCG tablet Take 1,000 mcg by mouth daily.     No current facility-administered medications for this visit.    Allergies  Allergen Reactions  . Coreg [Carvedilol] Other (See Comments)    Shortness of breath ,fatigue ,dizzyness   . Lovastatin Other (See Comments)    CK elevation, Myalgias  . Ativan [Lorazepam] Other (See Comments)    Pt. Had side effects    History   Social History  .  Marital Status: Married    Spouse Name: N/A  . Number of Children: N/A  . Years of Education: N/A   Occupational History  . Not on file.   Social History Main Topics  . Smoking status: Former Smoker -- 3 years    Types: Pipe  . Smokeless tobacco: Never Used     Comment: "quit smoking pipe in early 1970's"  . Alcohol Use: Yes     Comment: 09/30/2014 "wine w/dinner maybe once/yr"  . Drug Use: No  . Sexual Activity: No   Other Topics Concern  . 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 Has 3 daughters. 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      Review of Systems: General: negative for chills, fever, night sweats or weight changes.  Cardiovascular: negative for chest pain, dyspnea  on exertion, edema, orthopnea, palpitations, paroxysmal nocturnal dyspnea or shortness of breath Dermatological: negative for rash Respiratory: negative for cough or wheezing Urologic: negative for hematuria Abdominal: negative for nausea, vomiting, diarrhea, bright red blood per rectum, melena, or hematemesis Neurologic: negative for visual changes, syncope, or dizziness All other systems reviewed and are otherwise negative except as noted above. He has back DJD and will need injections (Dr Victor [name]) His diabetes is not well controlled and Dr Victor [name] is working with him.    Blood pressure 138/70, pulse 72, height 5\' 8"  (1.727 m), weight 193 lb (87.544 kg).  General appearance: alert, cooperative and no distress Neck: no carotid bruit and no JVD Lungs: clear to auscultation bilaterally Heart: regular rate and rhythm Skin: pale Neurologic: Grossly normal   ASSESSMENT AND PLAN:   Acute combined systolic and diastolic congestive heart failure Discharged 5/1/6 after admission for acute on chronic combined CHF.    Hx of CABG 1990 s/p CABG 1990 with Occlusion of both VGs. S/p SVG-OM3 PCI June 2014 Cath Sept 2015- medical Rx  s/p PCI of CTO of RCA 10/05/14 Cath 03/20/15- unsuccessful PCI CTO RCA   Cardiomyopathy, ischemic EF 35-40%   ICD- BS Nov 2012 for VT .   PLAN  I did not change Mr Victor [name]'s medication. I did order a BMP and CBC. He has been anemic and I wonder if he should be on iron. He is to see Dr Victor [name] in the next few weeks. F/U Dr Victor [name] in 3 months. He is weighing himself daily and will let us know if he has significant wgt gain.   Aviance Cooperwood K PA-C 04/13/2015 11:18 AM

## 2015-04-13 NOTE — Patient Instructions (Signed)
Your physician recommends that you return for lab work in: Today at [facility] - 1st floor.  Your physician recommends that you schedule a follow-up appointment in: 3 months with Dr. Martinique.

## 2015-04-13 NOTE — Assessment & Plan Note (Signed)
Discharged 5/1/6 after admission for acute on chronic combined CHF.

## 2015-04-13 NOTE — Assessment & Plan Note (Signed)
EF 35-40% 

## 2015-04-16 ENCOUNTER — Encounter: Payer: Self-pay | Admitting: Cardiology

## 2015-04-26 ENCOUNTER — Ambulatory Visit (HOSPITAL_BASED_OUTPATIENT_CLINIC_OR_DEPARTMENT_OTHER): Payer: Medicare Other | Attending: Orthopedic Surgery

## 2015-04-26 VITALS — Ht 68.0 in | Wt 181.0 lb

## 2015-04-26 DIAGNOSIS — G4710 Hypersomnia, unspecified: Secondary | ICD-10-CM | POA: Diagnosis present

## 2015-04-26 DIAGNOSIS — R0683 Snoring: Secondary | ICD-10-CM | POA: Insufficient documentation

## 2015-04-26 DIAGNOSIS — G4733 Obstructive sleep apnea (adult) (pediatric): Secondary | ICD-10-CM | POA: Diagnosis not present

## 2015-04-26 DIAGNOSIS — E119 Type 2 diabetes mellitus without complications: Secondary | ICD-10-CM

## 2015-05-02 DIAGNOSIS — E119 Type 2 diabetes mellitus without complications: Secondary | ICD-10-CM | POA: Diagnosis not present

## 2015-05-02 DIAGNOSIS — G4710 Hypersomnia, unspecified: Secondary | ICD-10-CM | POA: Diagnosis not present

## 2015-05-02 NOTE — Sleep Study (Signed)
   NAME: Victor [name] DATE OF BIRTH:  [birth year] MEDICAL RECORD NUMBER [id number]  LOCATION: [facility]  PHYSICIAN: YOUNG,CLINTON D  DATE OF STUDY: 04/26/2015  SLEEP STUDY TYPE: Nocturnal Polysomnogram               REFERRING PHYSICIAN: Marily Memos, MD   INDICATION FOR STUDY: Hypersomnia with sleep apnea  EPWORTH SLEEPINESS SCORE:   18/24 HEIGHT: 5\' 8"  (172.7 cm)  WEIGHT: 181 lb (82.101 kg)    Body mass index is 27.53 kg/(m^2).  NECK SIZE: 18 in.  MEDICATIONS: Charted for review  SLEEP ARCHITECTURE: Total sleep time 332.5 minutes with sleep efficiency 76.4%. Stage I was 4.1%, stage II 77.9%, stage III absent, REM 18% of total sleep time. Sleep latency 1 minute, REM latency 85 minutes, awake after sleep onset 101.5 minutes, arousal index 2.9, bedtime medication: None  RESPIRATORY DATA: Apnea hypopnea index (AHI) 17.5 per hour. 97 total events scored including 13 obstructive apneas, 8 central apneas, 76 hypopneas. Most events were while sleeping nonsupine REM AHI 26.0 per hour. This study was ordered as a diagnostic polysomnogram without CPAP titration.  OXYGEN DATA: Moderately loud snoring with oxygen desaturation to a nadir of 83% and mean saturation 94.6% on room air  CARDIAC DATA: Sinus rhythm with PVCs and PACs. Patient has pacemaker.  MOVEMENT/PARASOMNIA: No significant movement disturbance, bathroom 3  IMPRESSION/ RECOMMENDATION:   1) Moderate obstructive sleep apnea/hypopnea syndrome, AHI 17.5 per hour with mostly nonsupine events. REM AHI 26.0 per hour. Moderately loud snoring with oxygen desaturation to a nadir of 83% and mean saturation 94.6% on room air. 2) This study was ordered as a diagnostic polysomnogram without CPAP titration. The patient can return for a dedicated CPAP titration study if appropriate. Sleep medicine consultation is available if helpful. 3) The patient had some difficulty initiating and maintaining sleep with sleep onset  shortly after 11 PM and awake several times during the night for as long as an hour.   Deneise Lever Diplomate, American Board of Sleep Medicine  ELECTRONICALLY SIGNED ON:  05/02/2015, 10:06 AM [place] PH: [phone]   FX: [phone] [place]

## 2015-05-18 ENCOUNTER — Other Ambulatory Visit: Payer: Self-pay

## 2015-05-22 DIAGNOSIS — M4806 Spinal stenosis, lumbar region: Secondary | ICD-10-CM | POA: Diagnosis not present

## 2015-06-23 ENCOUNTER — Ambulatory Visit (INDEPENDENT_AMBULATORY_CARE_PROVIDER_SITE_OTHER): Payer: Medicare Other | Admitting: *Deleted

## 2015-06-23 DIAGNOSIS — I255 Ischemic cardiomyopathy: Secondary | ICD-10-CM

## 2015-06-24 DIAGNOSIS — M5030 Other cervical disc degeneration, unspecified cervical region: Secondary | ICD-10-CM | POA: Diagnosis not present

## 2015-06-24 NOTE — Progress Notes (Signed)
Remote ICD transmission.   

## 2015-06-28 ENCOUNTER — Inpatient Hospital Stay (HOSPITAL_COMMUNITY)
Admission: EM | Admit: 2015-06-28 | Discharge: 2015-06-30 | DRG: 293 | Disposition: A | Discharge status: Home / Self Care | Payer: Medicare Other | Attending: Cardiovascular Disease | Admitting: Cardiovascular Disease

## 2015-06-28 ENCOUNTER — Emergency Department (HOSPITAL_COMMUNITY): Payer: Medicare Other

## 2015-06-28 ENCOUNTER — Encounter (HOSPITAL_COMMUNITY): Payer: Self-pay | Admitting: Emergency Medicine

## 2015-06-28 DIAGNOSIS — R0902 Hypoxemia: Secondary | ICD-10-CM | POA: Diagnosis present

## 2015-06-28 DIAGNOSIS — I7300 Raynaud's syndrome without gangrene: Secondary | ICD-10-CM | POA: Diagnosis present

## 2015-06-28 DIAGNOSIS — I5023 Acute on chronic systolic (congestive) heart failure: Secondary | ICD-10-CM

## 2015-06-28 DIAGNOSIS — S40211A Abrasion of right shoulder, initial encounter: Secondary | ICD-10-CM | POA: Diagnosis not present

## 2015-06-28 DIAGNOSIS — S0990XA Unspecified injury of head, initial encounter: Secondary | ICD-10-CM | POA: Diagnosis not present

## 2015-06-28 DIAGNOSIS — S50311A Abrasion of right elbow, initial encounter: Secondary | ICD-10-CM | POA: Diagnosis present

## 2015-06-28 DIAGNOSIS — Z888 Allergy status to other drugs, medicaments and biological substances status: Secondary | ICD-10-CM

## 2015-06-28 DIAGNOSIS — R0602 Shortness of breath: Secondary | ICD-10-CM | POA: Diagnosis not present

## 2015-06-28 DIAGNOSIS — E1122 Type 2 diabetes mellitus with diabetic chronic kidney disease: Secondary | ICD-10-CM | POA: Diagnosis present

## 2015-06-28 DIAGNOSIS — I5022 Chronic systolic (congestive) heart failure: Secondary | ICD-10-CM | POA: Diagnosis not present

## 2015-06-28 DIAGNOSIS — E785 Hyperlipidemia, unspecified: Secondary | ICD-10-CM | POA: Diagnosis present

## 2015-06-28 DIAGNOSIS — E1129 Type 2 diabetes mellitus with other diabetic kidney complication: Secondary | ICD-10-CM

## 2015-06-28 DIAGNOSIS — K219 Gastro-esophageal reflux disease without esophagitis: Secondary | ICD-10-CM | POA: Diagnosis present

## 2015-06-28 DIAGNOSIS — D649 Anemia, unspecified: Secondary | ICD-10-CM | POA: Diagnosis present

## 2015-06-28 DIAGNOSIS — Z951 Presence of aortocoronary bypass graft: Secondary | ICD-10-CM

## 2015-06-28 DIAGNOSIS — I951 Orthostatic hypotension: Secondary | ICD-10-CM | POA: Diagnosis present

## 2015-06-28 DIAGNOSIS — Y93H2 Activity, gardening and landscaping: Secondary | ICD-10-CM

## 2015-06-28 DIAGNOSIS — I6529 Occlusion and stenosis of unspecified carotid artery: Secondary | ICD-10-CM | POA: Diagnosis present

## 2015-06-28 DIAGNOSIS — I2510 Atherosclerotic heart disease of native coronary artery without angina pectoris: Secondary | ICD-10-CM | POA: Diagnosis present

## 2015-06-28 DIAGNOSIS — T07XXXA Unspecified multiple injuries, initial encounter: Secondary | ICD-10-CM

## 2015-06-28 DIAGNOSIS — I5043 Acute on chronic combined systolic (congestive) and diastolic (congestive) heart failure: Secondary | ICD-10-CM

## 2015-06-28 DIAGNOSIS — Z8673 Personal history of transient ischemic attack (TIA), and cerebral infarction without residual deficits: Secondary | ICD-10-CM

## 2015-06-28 DIAGNOSIS — S5002XA Contusion of left elbow, initial encounter: Secondary | ICD-10-CM | POA: Diagnosis not present

## 2015-06-28 DIAGNOSIS — R0600 Dyspnea, unspecified: Secondary | ICD-10-CM | POA: Diagnosis not present

## 2015-06-28 DIAGNOSIS — G4733 Obstructive sleep apnea (adult) (pediatric): Secondary | ICD-10-CM | POA: Diagnosis present

## 2015-06-28 DIAGNOSIS — M1990 Unspecified osteoarthritis, unspecified site: Secondary | ICD-10-CM | POA: Diagnosis present

## 2015-06-28 DIAGNOSIS — R079 Chest pain, unspecified: Secondary | ICD-10-CM

## 2015-06-28 DIAGNOSIS — W19XXXA Unspecified fall, initial encounter: Secondary | ICD-10-CM

## 2015-06-28 DIAGNOSIS — E1165 Type 2 diabetes mellitus with hyperglycemia: Secondary | ICD-10-CM | POA: Diagnosis present

## 2015-06-28 DIAGNOSIS — M542 Cervicalgia: Secondary | ICD-10-CM | POA: Diagnosis present

## 2015-06-28 DIAGNOSIS — N183 Chronic kidney disease, stage 3 (moderate): Secondary | ICD-10-CM | POA: Diagnosis present

## 2015-06-28 DIAGNOSIS — Z79899 Other long term (current) drug therapy: Secondary | ICD-10-CM

## 2015-06-28 DIAGNOSIS — Z87891 Personal history of nicotine dependence: Secondary | ICD-10-CM | POA: Diagnosis not present

## 2015-06-28 DIAGNOSIS — Z7902 Long term (current) use of antithrombotics/antiplatelets: Secondary | ICD-10-CM

## 2015-06-28 DIAGNOSIS — Z95810 Presence of automatic (implantable) cardiac defibrillator: Secondary | ICD-10-CM

## 2015-06-28 DIAGNOSIS — G8929 Other chronic pain: Secondary | ICD-10-CM | POA: Diagnosis present

## 2015-06-28 DIAGNOSIS — I255 Ischemic cardiomyopathy: Secondary | ICD-10-CM

## 2015-06-28 DIAGNOSIS — R0789 Other chest pain: Secondary | ICD-10-CM | POA: Diagnosis not present

## 2015-06-28 DIAGNOSIS — D696 Thrombocytopenia, unspecified: Secondary | ICD-10-CM | POA: Diagnosis present

## 2015-06-28 DIAGNOSIS — M545 Low back pain: Secondary | ICD-10-CM | POA: Diagnosis present

## 2015-06-28 DIAGNOSIS — I129 Hypertensive chronic kidney disease with stage 1 through stage 4 chronic kidney disease, or unspecified chronic kidney disease: Secondary | ICD-10-CM | POA: Diagnosis present

## 2015-06-28 DIAGNOSIS — I509 Heart failure, unspecified: Secondary | ICD-10-CM | POA: Diagnosis not present

## 2015-06-28 DIAGNOSIS — R55 Syncope and collapse: Secondary | ICD-10-CM | POA: Diagnosis present

## 2015-06-28 HISTORY — DX: Cerebral infarction, unspecified: I63.9

## 2015-06-28 HISTORY — DX: Orthostatic hypotension: I95.1

## 2015-06-28 HISTORY — DX: Chronic kidney disease, stage 3 (moderate): N18.3

## 2015-06-28 HISTORY — DX: Chronic combined systolic (congestive) and diastolic (congestive) heart failure: I50.42

## 2015-06-28 HISTORY — DX: Anemia, unspecified: D64.9

## 2015-06-28 HISTORY — DX: Chronic kidney disease, stage 3 unspecified: N18.30

## 2015-06-28 HISTORY — DX: Thrombocytopenia, unspecified: D69.6

## 2015-06-28 LAB — COMPREHENSIVE METABOLIC PANEL
ALT: 18 U/L (ref 17–63)
AST: 21 U/L (ref 15–41)
Albumin: 3.5 g/dL (ref 3.5–5.0)
Alkaline Phosphatase: 63 U/L (ref 38–126)
Anion gap: 12 (ref 5–15)
BUN: 44 mg/dL — ABNORMAL HIGH (ref 6–20)
CHLORIDE: 94 mmol/L — AB (ref 101–111)
CO2: 29 mmol/L (ref 22–32)
CREATININE: 1.44 mg/dL — AB (ref 0.61–1.24)
Calcium: 9.1 mg/dL (ref 8.9–10.3)
GFR calc non Af Amer: 45 mL/min — ABNORMAL LOW (ref >60)
GFR, EST AFRICAN AMERICAN: 53 mL/min — AB (ref >60)
GLUCOSE: 177 mg/dL — AB (ref 65–99)
POTASSIUM: 4.5 mmol/L (ref 3.5–5.1)
Sodium: 135 mmol/L (ref 135–145)
TOTAL PROTEIN: 6 g/dL — AB (ref 6.5–8.1)
Total Bilirubin: 0.8 mg/dL (ref 0.3–1.2)

## 2015-06-28 LAB — CBC WITH DIFFERENTIAL/PLATELET
BASOS ABS: 0 10*3/uL (ref 0.0–0.1)
Basophils Relative: 0 % (ref 0–1)
EOS ABS: 0.2 10*3/uL (ref 0.0–0.7)
Eosinophils Relative: 2 % (ref 0–5)
HCT: 34.4 % — ABNORMAL LOW (ref 39.0–52.0)
Hemoglobin: 11.4 g/dL — ABNORMAL LOW (ref 13.0–17.0)
LYMPHS ABS: 1.8 10*3/uL (ref 0.7–4.0)
Lymphocytes Relative: 15 % (ref 12–46)
MCH: 27.8 pg (ref 26.0–34.0)
MCHC: 33.1 g/dL (ref 30.0–36.0)
MCV: 83.9 fL (ref 78.0–100.0)
MONO ABS: 0.9 10*3/uL (ref 0.1–1.0)
Monocytes Relative: 7 % (ref 3–12)
Neutro Abs: 9.6 10*3/uL — ABNORMAL HIGH (ref 1.7–7.7)
Neutrophils Relative %: 76 % (ref 43–77)
Platelets: 180 10*3/uL (ref 150–400)
RBC: 4.1 MIL/uL — ABNORMAL LOW (ref 4.22–5.81)
RDW: 14.9 % (ref 11.5–15.5)
WBC: 12.6 10*3/uL — AB (ref 4.0–10.5)

## 2015-06-28 LAB — CBC
HEMATOCRIT: 32 % — AB (ref 39.0–52.0)
Hemoglobin: 10.8 g/dL — ABNORMAL LOW (ref 13.0–17.0)
MCH: 28.4 pg (ref 26.0–34.0)
MCHC: 33.8 g/dL (ref 30.0–36.0)
MCV: 84.2 fL (ref 78.0–100.0)
Platelets: 146 10*3/uL — ABNORMAL LOW (ref 150–400)
RBC: 3.8 MIL/uL — AB (ref 4.22–5.81)
RDW: 14.9 % (ref 11.5–15.5)
WBC: 8.7 10*3/uL (ref 4.0–10.5)

## 2015-06-28 LAB — T4, FREE: Free T4: 1.07 ng/dL (ref 0.61–1.12)

## 2015-06-28 LAB — CREATININE, SERUM
Creatinine, Ser: 1.34 mg/dL — ABNORMAL HIGH (ref 0.61–1.24)
GFR calc non Af Amer: 49 mL/min — ABNORMAL LOW (ref >60)
GFR, EST AFRICAN AMERICAN: 57 mL/min — AB (ref >60)

## 2015-06-28 LAB — TROPONIN I
Troponin I: 0.03 ng/mL (ref <0.031)
Troponin I: 0.03 ng/mL (ref <0.031)

## 2015-06-28 LAB — TSH: TSH: 1.001 u[IU]/mL (ref 0.350–4.500)

## 2015-06-28 LAB — I-STAT TROPONIN, ED: Troponin i, poc: 0.02 ng/mL (ref 0.00–0.08)

## 2015-06-28 LAB — MAGNESIUM: Magnesium: 1.9 mg/dL (ref 1.7–2.4)

## 2015-06-28 LAB — GLUCOSE, CAPILLARY: GLUCOSE-CAPILLARY: 276 mg/dL — AB (ref 65–99)

## 2015-06-28 LAB — BRAIN NATRIURETIC PEPTIDE: B Natriuretic Peptide: 382.6 pg/mL — ABNORMAL HIGH (ref 0.0–100.0)

## 2015-06-28 LAB — PROTIME-INR
INR: 1.23 (ref 0.00–1.49)
PROTHROMBIN TIME: 15.7 s — AB (ref 11.6–15.2)

## 2015-06-28 MED ORDER — LISINOPRIL 2.5 MG PO TABS
2.5000 mg | ORAL_TABLET | Freq: Every day | ORAL | Status: DC
  Administered 2015-06-28 – 2015-06-30 (×3): 2.5 mg via ORAL
  Filled 2015-06-28 (×3): qty 1

## 2015-06-28 MED ORDER — ETODOLAC 400 MG PO TABS
400.0000 mg | ORAL_TABLET | Freq: Every day | ORAL | Status: DC
  Administered 2015-06-28 – 2015-06-29 (×2): 400 mg via ORAL
  Filled 2015-06-28 (×2): qty 1

## 2015-06-28 MED ORDER — ACETAMINOPHEN 325 MG PO TABS: 650.0000 mg | ORAL_TABLET | ORAL | Status: DC | PRN

## 2015-06-28 MED ORDER — CLOPIDOGREL BISULFATE 75 MG PO TABS
75.0000 mg | ORAL_TABLET | Freq: Every day | ORAL | Status: DC
  Administered 2015-06-29 – 2015-06-30 (×2): 75 mg via ORAL
  Filled 2015-06-28 (×2): qty 1

## 2015-06-28 MED ORDER — HYDROCODONE-ACETAMINOPHEN 10-325 MG PO TABS
1.0000 | ORAL_TABLET | Freq: Three times a day (TID) | ORAL | Status: DC | PRN
  Administered 2015-06-29: 1 via ORAL
  Filled 2015-06-28: qty 1

## 2015-06-28 MED ORDER — PREGABALIN 75 MG PO CAPS
150.0000 mg | ORAL_CAPSULE | Freq: Two times a day (BID) | ORAL | Status: DC
  Administered 2015-06-28 – 2015-06-30 (×4): 150 mg via ORAL
  Filled 2015-06-28 (×4): qty 2

## 2015-06-28 MED ORDER — AMOXICILLIN-POT CLAVULANATE 500-125 MG PO TABS
1.0000 | ORAL_TABLET | Freq: Two times a day (BID) | ORAL | Status: DC
  Administered 2015-06-28 – 2015-06-30 (×4): 500 mg via ORAL
  Filled 2015-06-28 (×5): qty 1

## 2015-06-28 MED ORDER — ONDANSETRON HCL 4 MG/2ML IJ SOLN: 4.0000 mg | Freq: Four times a day (QID) | INTRAMUSCULAR | Status: DC | PRN

## 2015-06-28 MED ORDER — HEPARIN SODIUM (PORCINE) 5000 UNIT/ML IJ SOLN
5000.0000 [IU] | Freq: Three times a day (TID) | INTRAMUSCULAR | Status: DC
  Administered 2015-06-28 – 2015-06-30 (×5): 5000 [IU] via SUBCUTANEOUS
  Filled 2015-06-28 (×7): qty 1

## 2015-06-28 MED ORDER — ASPIRIN 300 MG RE SUPP: 300.0000 mg | RECTAL | Status: AC

## 2015-06-28 MED ORDER — NITROGLYCERIN 0.4 MG SL SUBL: 0.4000 mg | SUBLINGUAL_TABLET | SUBLINGUAL | Status: DC | PRN

## 2015-06-28 MED ORDER — ASPIRIN 81 MG PO CHEW: 324.0000 mg | CHEWABLE_TABLET | ORAL | Status: AC

## 2015-06-28 MED ORDER — MUPIROCIN 2 % EX OINT
1.0000 "application " | TOPICAL_OINTMENT | Freq: Two times a day (BID) | CUTANEOUS | Status: DC
  Administered 2015-06-28 – 2015-06-30 (×4): 1 via TOPICAL
  Filled 2015-06-28 (×2): qty 22

## 2015-06-28 MED ORDER — SODIUM CHLORIDE 0.9 % IV SOLN
INTRAVENOUS | Status: DC
  Administered 2015-06-28: 18:00:00 via INTRAVENOUS

## 2015-06-28 MED ORDER — VITAMIN B-12 1000 MCG PO TABS
1000.0000 ug | ORAL_TABLET | Freq: Every day | ORAL | Status: DC
  Administered 2015-06-28 – 2015-06-30 (×3): 1000 ug via ORAL
  Filled 2015-06-28 (×3): qty 1

## 2015-06-28 MED ORDER — PRAVASTATIN SODIUM 40 MG PO TABS
40.0000 mg | ORAL_TABLET | Freq: Every day | ORAL | Status: DC
  Administered 2015-06-28 – 2015-06-29 (×2): 40 mg via ORAL
  Filled 2015-06-28 (×3): qty 1

## 2015-06-28 MED ORDER — ASPIRIN 81 MG PO CHEW
324.0000 mg | CHEWABLE_TABLET | Freq: Once | ORAL | Status: AC
  Administered 2015-06-28: 324 mg via ORAL
  Filled 2015-06-28: qty 4

## 2015-06-28 MED ORDER — FUROSEMIDE 10 MG/ML IJ SOLN
40.0000 mg | Freq: Two times a day (BID) | INTRAMUSCULAR | Status: DC
  Administered 2015-06-28 – 2015-06-30 (×4): 40 mg via INTRAVENOUS
  Filled 2015-06-28 (×6): qty 4

## 2015-06-28 MED ORDER — OXYCODONE HCL 5 MG PO TABS
10.0000 mg | ORAL_TABLET | Freq: Three times a day (TID) | ORAL | Status: DC | PRN
Start: 2015-06-28 — End: 2015-06-30
  Administered 2015-06-28 – 2015-06-29 (×2): 10 mg via ORAL
  Filled 2015-06-28 (×2): qty 2

## 2015-06-28 MED ORDER — POTASSIUM CHLORIDE CRYS ER 10 MEQ PO TBCR
20.0000 meq | EXTENDED_RELEASE_TABLET | Freq: Two times a day (BID) | ORAL | Status: DC
  Administered 2015-06-28 – 2015-06-30 (×4): 20 meq via ORAL
  Filled 2015-06-28 (×8): qty 2

## 2015-06-28 MED ORDER — ASPIRIN EC 81 MG PO TBEC
81.0000 mg | DELAYED_RELEASE_TABLET | Freq: Every day | ORAL | Status: DC
  Administered 2015-06-29 – 2015-06-30 (×2): 81 mg via ORAL
  Filled 2015-06-28 (×2): qty 1

## 2015-06-28 MED ORDER — BISOPROLOL FUMARATE 5 MG PO TABS
2.5000 mg | ORAL_TABLET | Freq: Every day | ORAL | Status: DC
  Administered 2015-06-28 – 2015-06-30 (×3): 2.5 mg via ORAL
  Filled 2015-06-28 (×3): qty 0.5

## 2015-06-28 NOTE — ED Notes (Signed)
Pt.l stated, I started getting SOB this morning at 0400. I fell last Wednesday and hurt my head rt. Arm, shoulder. Pt takes blood thinners. I USUALLY HAVE A HEART PROBLEM WHEN IM SOB

## 2015-06-28 NOTE — ED Provider Notes (Signed)
CSN: [id number]     Arrival date & time 06/28/15  1110 History   First MD Initiated Contact with Patient 06/28/15 1129     Chief Complaint  Patient presents with  . Shortness of Breath  . Fall     (Consider location/radiation/quality/duration/timing/severity/associated sxs/prior Treatment) HPI Comments: Patient presents to the ER for evaluation of shortness of breath. Patient has been having increased difficulty breathing for approximately a week, but symptoms significantly worsens early this morning. Patient reports he woke up at 4 AM gasping for breath. Every time he tried to lay back down to go back to sleep he would become more short of breath. He has been experiencing chest pain when the breathing difficulty is at its worst. He has taken 4 nitroglycerin today for these symptoms. At time of arrival, patient is still short of breath. He is not expressing any chest pain. Patient does report significant worsening of his breathing with exertion and walking short distances as well as lying flat. He does have a history of congestive heart failure and cardiac blockages.  Patient does report that he has a history of significant lower extremity swelling. He reports that his Lasix was increased to 80 mg 3 times a day when he was hospitalized several months ago. Since then he has not been retaining significant amounts of fluid. He reports that he has gained some fluid in his legs this week, but only has gained about 1 pound on his daily weights.  Patient also complains of right shoulder and elbow pain. Patient reports that he did have a fall last week. He hit his head and does have a headache. He also reports wounds to the back of his right shoulder and his elbow. He saw his orthopedic doctor this past week and was started on antibiotic for the wounds. He has not had x-rays.  Patient is a 79 y.o. male presenting with shortness of breath and fall.  Shortness of Breath Associated symptoms: chest pain and  headaches   Fall Associated symptoms include chest pain, headaches and shortness of breath.    Past Medical History  Diagnosis Date  . Occlusion and stenosis of carotid artery without mention of cerebral infarction   . Raynaud's syndrome   . Paroxysmal ventricular tachycardia   . Peripheral vascular disease   . Cardiomyopathy, ischemic     a. s/p ICD placement  . Automatic implantable cardioverter-defibrillator in situ   . HLD (hyperlipidemia)   . GERD (gastroesophageal reflux disease)   . Chronic lower back pain   . Coronary artery disease     a. s/p CABG 1990  b. s/p PCI of CTO of RCA 10/05/14  . Hypertension   . Type II diabetes mellitus   . Arthritis   . TIA (transient ischemic attack)     a. s/p LHC on 10/01/14   . CHF (congestive heart failure)   . Shortness of breath dyspnea    Past Surgical History  Procedure Laterality Date  . Anal fistulectomy  2000's  . Cataract extraction, bilateral Bilateral ~ 2009  . Cardiac defibrillator placement  02/1989-12/2011    "I've had a total of 6 or 7 defibrillators put in" (09/30/2014)  . Finger fracture surgery Left ~ 2000    "pointer"  . Coronary artery bypass graft  01/1989    "CABG X3"   . Fracture surgery    . Coronary angioplasty with stent placement  04/2013    "1"  . Cardiac catheterization  07/2014  . Coronary  angioplasty  10/01/14    PTVA to CTO with restoration of TIMI 3  . Implantable cardioverter defibrillator (icd) generator change Left 10/05/2011    Procedure: ICD GENERATOR CHANGE;  Surgeon: Evans Lance, MD;  Location: [facility] CATH LAB;  Service: Cardiovascular;  Laterality: Left;  . Left and right heart catheterization with coronary/graft angiogram N/A 04/26/2013    Procedure: LEFT AND RIGHT HEART CATHETERIZATION WITH Beatrix Fetters;  Surgeon: Sherren Mocha, MD;  Location: [facility] CATH LAB;  Service: Cardiovascular;  Laterality: N/A;  . Percutaneous stent intervention  04/26/2013    Procedure: PERCUTANEOUS STENT  INTERVENTION;  Surgeon: Sherren Mocha, MD;  Location: [facility] CATH LAB;  Service: Cardiovascular;;  . Left heart catheterization with coronary angiogram N/A 08/01/2014    Procedure: LEFT HEART CATHETERIZATION WITH CORONARY ANGIOGRAM;  Surgeon: Sinclair Grooms, MD;  Location: [facility] CATH LAB;  Service: Cardiovascular;  Laterality: N/A;  . Right heart catheterization  10/01/2014    Procedure: RIGHT HEART CATH;  Surgeon: Peter M Martinique, MD;  Location: [facility] CATH LAB;  Service: Cardiovascular;;  . Cardiac catheterization  10/01/2014    Procedure: CORONARY BALLOON ANGIOPLASTY;  Surgeon: Peter M Martinique, MD;  Location: [facility]  CATH LAB;  Service: Cardiovascular;;  . Left heart catheterization with coronary angiogram N/A 03/20/2015    Procedure: LEFT HEART CATHETERIZATION WITH CORONARY ANGIOGRAM;  Surgeon: Peter M Martinique, MD;  Location: [facility] CATH LAB;  Service: Cardiovascular;  Laterality: N/A;   Family History  Problem Relation Age of Onset  . Heart failure Mother 58  . Heart attack Father   . Leukemia Sister   . Stomach cancer Maternal Grandfather    History  Substance Use Topics  . Smoking status: Former Smoker -- 3 years    Types: Pipe  . Smokeless tobacco: Never Used     Comment: "quit smoking pipe in early 1970's"  . Alcohol Use: Yes     Comment: 09/30/2014 "wine w/dinner maybe once/yr"    Review of Systems  Respiratory: Positive for shortness of breath.   Cardiovascular: Positive for chest pain and leg swelling.       Orthopnea and dyspnea on exertion  Musculoskeletal: Positive for arthralgias.  Skin: Positive for wound.  Neurological: Positive for headaches.  All other systems reviewed and are negative.     Allergies  Coreg; Lovastatin; and Ativan  Home Medications   Prior to Admission medications   Medication Sig Start Date End Date Taking? Authorizing Provider  amoxicillin-clavulanate (AUGMENTIN) 500-125 MG per tablet Take 1 tablet by mouth 2 (two) times daily.   Yes Historical  Provider, MD  aspirin (ASPIR-81) 81 MG EC tablet Take 81 mg by mouth at bedtime.    Yes Historical Provider, MD  bisoprolol (ZEBETA) 5 MG tablet Take 0.5 tablets (2.5 mg total) by mouth daily. 12/05/14  Yes Peter M Martinique, MD  clopidogrel (PLAVIX) 75 MG tablet TAKE 1 TABLET EVERY DAY WITH BREAKFAST 09/29/14  Yes Evans Lance, MD  dexamethasone (DECADRON) 4 MG tablet Take 4 mg by mouth daily as needed. 02/18/15  Yes Historical Provider, MD  etodolac (LODINE) 400 MG tablet Take 400 mg by mouth daily.    Yes Historical Provider, MD  furosemide (LASIX) 40 MG tablet 60mg  in AM and 40mg  in PM Patient taking differently: Take 80 mg by mouth 3 (three) times daily.  03/22/15  Yes Almyra Deforest, PA  HYDROcodone-acetaminophen (NORCO) 10-325 MG per tablet Take 1 tablet by mouth every 8 (eight) hours as needed for moderate pain.  01/21/15  Yes Historical Provider, MD  insulin aspart protamine- aspart (NOVOLOG MIX 70/30) (70-30) 100 UNIT/ML injection Inject 10-30 Units into the skin 2 (two) times daily with a meal. Pt scales his med how he fills   Yes Historical Provider, MD  lisinopril (PRINIVIL,ZESTRIL) 2.5 MG tablet Take 1 tablet (2.5 mg total) by mouth daily. 10/20/14  Yes Isaiah Serge, NP  LYRICA 150 MG capsule Take 150 mg by mouth 2 (two) times daily. 02/03/15  Yes Historical Provider, MD  metFORMIN (GLUCOPHAGE) 1000 MG tablet Take 1,000 mg by mouth 2 (two) times daily with a meal.   Yes Historical Provider, MD  mupirocin ointment (BACTROBAN) 2 % Apply 1 application topically 2 (two) times daily. 06/24/15  Yes Historical Provider, MD  nitroGLYCERIN (NITROSTAT) 0.4 MG SL tablet Place 0.4 mg under the tongue every 5 (five) minutes as needed for chest pain.   Yes Historical Provider, MD  Oxycodone HCl 10 MG TABS Take 10 mg by mouth every 8 (eight) hours as needed (moderate pain).  02/18/15  Yes Historical Provider, MD  potassium chloride (KLOR-CON) 20 MEQ packet Take 20 mEq by mouth 2 (two) times daily. 03/22/15  Yes Almyra Deforest, PA  pravastatin (PRAVACHOL) 40 MG tablet Take 40 mg by mouth at bedtime.    Yes Historical Provider, MD  SANTYL ointment Apply 1 application topically every other day. 03/04/15  Yes Historical Provider, MD  vitamin B-12 (CYANOCOBALAMIN) 1000 MCG tablet Take 1,000 mcg by mouth daily.   Yes Historical Provider, MD   BP 99/43 mmHg  Pulse 62  Temp(Src) 97.7 F (36.5 C) (Oral)  Resp 22  Ht 5\' 8"  (1.727 m)  Wt 192 lb 5 oz (87.232 kg)  BMI 29.25 kg/m2  SpO2 91% Physical Exam  Constitutional: He is oriented to person, place, and time. He appears well-developed and well-nourished. No distress.  HENT:  Head: Normocephalic and atraumatic.  Right Ear: Hearing normal.  Left Ear: Hearing normal.  Nose: Nose normal.  Mouth/Throat: Oropharynx is clear and moist and mucous membranes are normal.  Eyes: Conjunctivae and EOM are normal. Pupils are equal, round, and reactive to light.  Neck: Normal range of motion. Neck supple.  Cardiovascular: Regular rhythm, S1 normal and S2 normal.  Exam reveals no gallop and no friction rub.   No murmur heard. Pulmonary/Chest: Accessory muscle usage present. No respiratory distress. He has decreased breath sounds. He has rales. He exhibits no tenderness.  Abdominal: Soft. Normal appearance and bowel sounds are normal. There is no hepatosplenomegaly. There is no tenderness. There is no rebound, no guarding, no tenderness at McBurney's point and negative Murphy's sign. No hernia.  Musculoskeletal: Normal range of motion.       Right elbow: He exhibits no swelling and no deformity. Tenderness found.  Neurological: He is alert and oriented to person, place, and time. He has normal strength. No cranial nerve deficit or sensory deficit. Coordination normal. GCS eye subscore is 4. GCS verbal subscore is 5. GCS motor subscore is 6.  Skin: Skin is warm, dry and intact. No rash noted. No cyanosis.     Psychiatric: He has a normal mood and affect. His speech is normal and  behavior is normal. Thought content normal.  Nursing note and vitals reviewed.   ED Course  Procedures (including critical care time) Labs Review Labs Reviewed  CBC WITH DIFFERENTIAL/PLATELET - Abnormal; Notable for the following:    WBC 12.6 (*)    RBC 4.10 (*)    Hemoglobin 11.4 (*)  HCT 34.4 (*)    Neutro Abs 9.6 (*)    All other components within normal limits  COMPREHENSIVE METABOLIC PANEL - Abnormal; Notable for the following:    Chloride 94 (*)    Glucose, Bld 177 (*)    BUN 44 (*)    Creatinine, Ser 1.44 (*)    Total Protein 6.0 (*)    GFR calc non Af Amer 45 (*)    GFR calc Af Amer 53 (*)    All other components within normal limits  BRAIN NATRIURETIC PEPTIDE - Abnormal; Notable for the following:    B Natriuretic Peptide 382.6 (*)    All other components within normal limits  TROPONIN I  I-STAT TROPOININ, ED    Imaging Review Dg Chest 2 View  06/28/2015   CLINICAL DATA:  Shortness of breath since 0400 today, history CHF, ischemic cardiomyopathy, coronary artery disease, hypertension, type II diabetes mellitus, former smoker  EXAM: CHEST  2 VIEW  COMPARISON:  03/21/2015  FINDINGS: LEFT subclavian AICD lead projects at RIGHT ventricle unchanged.  Internal defibrillator leads/patches again identified.  Enlargement of cardiac silhouette post CABG.  Atherosclerotic calcifications of thoracoabdominal aorta.  Mediastinal contours and pulmonary vascularity normal.  Lungs clear.  No pleural effusion or pneumothorax.  Bones demineralized.  Mild central peribronchial thickening.  IMPRESSION: Enlargement of cardiac silhouette post CABG and AICD.  Minimal chronic bronchitic changes.  No acute abnormalities.   Electronically Signed   By: Lavonia Dana M.D.   On: 06/28/2015 12:52   Dg Elbow Complete Right  06/28/2015   CLINICAL DATA:  Elbow pain and contusion after falling 4 days ago. Initial encounter.  EXAM: RIGHT ELBOW - COMPLETE 3+ VIEW  COMPARISON:  None.  FINDINGS: The  mineralization and alignment are normal. There is no evidence of acute fracture or dislocation. There are mild degenerative changes with spurring of the coronoid process and lateral humeral epicondyle. No elbow joint effusion or foreign body demonstrated.  IMPRESSION: No acute osseous findings or joint effusion. Underlying degenerative changes.   Electronically Signed   By: Richardean Sale M.D.   On: 06/28/2015 12:51   Ct Head Wo Contrast  06/28/2015   CLINICAL DATA:  [place] weed[place] to a pond, history TIA, type II diabetes mellitus, CHF, hypertension  EXAM: CT HEAD WITHOUT CONTRAST  TECHNIQUE: Contiguous axial images were obtained from the base of the skull through the vertex without intravenous contrast.  COMPARISON:  10/02/2014  FINDINGS: Normal ventricular morphology.  No midline shift or mass effect.  Normal appearance of brain parenchyma.  No intracranial hemorrhage, mass lesion, or evidence acute infarction.  Small old lacunar infarct LEFT basal ganglia unchanged.  No extra-axial fluid collections.  Extensive atherosclerotic calcifications of internal carotid and vertebral arteries at skullbase.  Bones and sinuses unremarkable.  IMPRESSION: Small old lacunar infarct LEFT basal ganglia.  No acute intracranial abnormalities.   Electronically Signed   By: Lavonia Dana M.D.   On: 06/28/2015 12:58     EKG Interpretation   Date/Time:  Sunday June 28 2015 11:13:41 EDT Ventricular Rate:  70 PR Interval:  286 QRS Duration: 112 QT Interval:  408 QTC Calculation: 440 R Axis:   -15 Text Interpretation:  Sinus rhythm with 1st degree A-V block with  Premature atrial complexes in a pattern of bigeminy Inferior infarct , age  undetermined Cannot rule out Anterior infarct , age undetermined Abnormal  ECG No significant change since last tracing Confirmed by [facility]  MD,  CHRISTOPHER [phone]) on 06/28/2015 11:35:17 AM      MDM   Final diagnoses:  Fall  Multiple abrasions    Acute on chronic combined systolic and diastolic congestive heart failure  Chest pain, unspecified chest pain type    Presents to the ER for evaluation of shortness of breath and chest pain. Patient has a history of known coronary artery disease. He is status post bypass surgery with occlusion of his bypass grafts. Patient also has chronic occlusions of native coronary arteries, his medical management currently. Patient had recent hospitalization for acutely decompensated congestive heart failure. In the last week he has had progressively worsening shortness of breath and now severe shortness of breath which began overnight. Symptoms this morning have been accompanied by recurrent chest pain treated with 4 different doses of nitroglycerin prior to arrival. He is pain-free at arrival. EKG does not show ischemia. Troponin is negative. Will consult cardiology for further management of what is felt to be congestive heart failure and possible angina.  Patient also reports a fall last week. He is complaining mainly of right elbow pain. He has abrasions on his posterior right shoulder, right upper arm and right elbow. There does not appear to be any obvious infection. X-ray of elbow was unremarkable.  Orpah Greek, MD 06/28/15 [phone]

## 2015-06-28 NOTE — H&P (Signed)
Victor [name] is an 79 y.o. male.    Primary Cardiologist:Dr. Jordan/ Dr. Lovena Le  PCP: Leonides Sake, MD  Chief Complaint: SOB at 0400, usually this means a heart problem.  HPI: 79 y/o male with known CAD s/p CABG x 2 in 1990 with multiple PCIs since that time. Last PCI was 10/01/14, he underwent CTO PCI of the RCA. He also has chronic combined systolic + diastolic CHF with EF of 56% . He had VT in 2012 and is S/p BS ICD. Other problems include T2IDDM,CRI-3, HTN, HLD and PAD with carotid disease. He was admitted from the office to [facility] 03/18/15 for IV diuretic therapy for acute on chronic systolic + diastolic CHF with dyspnea and volume overload. Also with new EKG changes on EKG with lateral T wave abnormalities.He underwent coronary angiogram 03/20/15 and had unsuccessful attempt at PCI of CTO RCA. Plan is for medical Rx.   Today came to ER for SOB at 0400 unable to rest, but this has been occurring over last week.  No edema, though abd. Feel more tight to pt.  Some chest tightness as well.  Symptoms increased with exertion but do occur at night as well.  With exertion further complicated by cervical disc disease and needs it injected.  When he walks has neck pain and increased SOB.   Also fall last week with scraping of skin on rt.  Saw ortho and treated.  Today CT head done because pt complains of near syncope and wife states he seems confused at times.  Troponin negative.  EKG SR without acute changes.     Past Medical History  Diagnosis Date  . Occlusion and stenosis of carotid artery without mention of cerebral infarction   . Raynaud's syndrome   . Paroxysmal ventricular tachycardia   . Peripheral vascular disease   . Cardiomyopathy, ischemic     a. s/p ICD placement  . Automatic implantable cardioverter-defibrillator in situ   . HLD (hyperlipidemia)   . GERD (gastroesophageal reflux disease)   . Chronic lower back pain   . Coronary artery disease     a. s/p  CABG 1990  b. s/p PCI of CTO of RCA 10/05/14  . Hypertension   . Type II diabetes mellitus   . Arthritis   . TIA (transient ischemic attack)     a. s/p LHC on 10/01/14   . CHF (congestive heart failure)   . Shortness of breath dyspnea     Past Surgical History  Procedure Laterality Date  . Anal fistulectomy  2000's  . Cataract extraction, bilateral Bilateral ~ 2009  . Cardiac defibrillator placement  02/1989-12/2011    "I've had a total of 6 or 7 defibrillators put in" (09/30/2014)  . Finger fracture surgery Left ~ 2000    "pointer"  . Coronary artery bypass graft  01/1989    "CABG X3"   . Fracture surgery    . Coronary angioplasty with stent placement  04/2013    "1"  . Cardiac catheterization  07/2014  . Coronary angioplasty  10/01/14    PTVA to CTO with restoration of TIMI 3  . Implantable cardioverter defibrillator (icd) generator change Left 10/05/2011    Procedure: ICD GENERATOR CHANGE;  Surgeon: Evans Lance, MD;  Location: [facility] CATH LAB;  Service: Cardiovascular;  Laterality: Left;  . Left and right heart catheterization with coronary/graft angiogram N/A 04/26/2013    Procedure: LEFT AND RIGHT HEART CATHETERIZATION WITH CORONARY/GRAFT ANGIOGRAM;  Surgeon: Sherren Mocha, MD;  Location: [facility] CATH LAB;  Service: Cardiovascular;  Laterality: N/A;  . Percutaneous stent intervention  04/26/2013    Procedure: PERCUTANEOUS STENT INTERVENTION;  Surgeon: Sherren Mocha, MD;  Location: [facility] CATH LAB;  Service: Cardiovascular;;  . Left heart catheterization with coronary angiogram N/A 08/01/2014    Procedure: LEFT HEART CATHETERIZATION WITH CORONARY ANGIOGRAM;  Surgeon: Sinclair Grooms, MD;  Location[facility] CATH LAB;  Service: Cardiovascular;  Laterality: N/A;  . Right heart catheterization  10/01/2014    Procedure: RIGHT HEART CATH;  Surgeon: Peter M Martinique, MD;  Location: [facility] CATH LAB;  Service: Cardiovascular;;  . Cardiac catheterization  10/01/2014    Procedure: CORONARY BALLOON ANGIOPLASTY;   Surgeon: Peter M Martinique, MD;  Location: [facility] CATH LAB;  Service: Cardiovascular;;  . Left heart catheterization with coronary angiogram N/A 03/20/2015    Procedure: LEFT HEART CATHETERIZATION WITH CORONARY ANGIOGRAM;  Surgeon: Peter M Martinique, MD;  Location: [facility] CATH LAB;  Service: Cardiovascular;  Laterality: N/A;    Family History  Problem Relation Age of Onset  . Heart failure Mother 39  . Heart attack Father   . Leukemia Sister   . Stomach cancer Maternal Grandfather    Social History:  reports that he has quit smoking. His smoking use included Pipe. He has never used smokeless tobacco. He reports that he drinks alcohol. He reports that he does not use illicit drugs.  Allergies:  Allergies  Allergen Reactions  . Coreg [Carvedilol] Other (See Comments)    Shortness of breath ,fatigue ,dizzyness   . Lovastatin Other (See Comments)    CK elevation, Myalgias  . Ativan [Lorazepam] Other (See Comments)    Pt. Had side effects    OUTPATIENT MEDICATIONS: No current facility-administered medications on file prior to encounter.   Current Outpatient Prescriptions on File Prior to Encounter  Medication Sig Dispense Refill  . aspirin (ASPIR-81) 81 MG EC tablet Take 81 mg by mouth at bedtime.     . bisoprolol (ZEBETA) 5 MG tablet Take 0.5 tablets (2.5 mg total) by mouth daily. 45 tablet 3  . clopidogrel (PLAVIX) 75 MG tablet TAKE 1 TABLET EVERY DAY WITH BREAKFAST 90 tablet 3  . dexamethasone (DECADRON) 4 MG tablet Take 4 mg by mouth daily as needed.  0  . etodolac (LODINE) 400 MG tablet Take 400 mg by mouth daily.     . furosemide (LASIX) 40 MG tablet $RemoveB'60mg'yDAaMZwG$  in AM and $Remo'40mg'UxHwU$  in PM (Patient taking differently: Take 80 mg by mouth 3 (three) times daily. ) 180 tablet 3  . HYDROcodone-acetaminophen (NORCO) 10-325 MG per tablet Take 1 tablet by mouth every 8 (eight) hours as needed for moderate pain.   0  . lisinopril (PRINIVIL,ZESTRIL) 2.5 MG tablet Take 1 tablet (2.5 mg total) by mouth daily. 90  tablet 2  . LYRICA 150 MG capsule Take 150 mg by mouth 2 (two) times daily.  1  . metFORMIN (GLUCOPHAGE) 1000 MG tablet Take 1,000 mg by mouth 2 (two) times daily with a meal.    . nitroGLYCERIN (NITROSTAT) 0.4 MG SL tablet Place 0.4 mg under the tongue every 5 (five) minutes as needed for chest pain.    . Oxycodone HCl 10 MG TABS Take 10 mg by mouth every 8 (eight) hours as needed (moderate pain).   0  . potassium chloride (KLOR-CON) 20 MEQ packet Take 20 mEq by mouth 2 (two) times daily. 90 tablet 3  . pravastatin (PRAVACHOL) 40 MG tablet Take 40  mg by mouth at bedtime.     Marland Kitchen SANTYL ointment Apply 1 application topically every other day.  2  . vitamin B-12 (CYANOCOBALAMIN) 1000 MCG tablet Take 1,000 mcg by mouth daily.       Results for orders placed or performed during the hospital encounter of 06/28/15 (from the past 48 hour(s))  CBC with Differential/Platelet     Status: Abnormal   Collection Time: 06/28/15 11:44 AM  Result Value Ref Range   WBC 12.6 (H) 4.0 - 10.5 K/uL   RBC 4.10 (L) 4.22 - 5.81 MIL/uL   Hemoglobin 11.4 (L) 13.0 - 17.0 g/dL   HCT 34.4 (L) 39.0 - 52.0 %   MCV 83.9 78.0 - 100.0 fL   MCH 27.8 26.0 - 34.0 pg   MCHC 33.1 30.0 - 36.0 g/dL   RDW 14.9 11.5 - 15.5 %   Platelets 180 150 - 400 K/uL   Neutrophils Relative % 76 43 - 77 %   Neutro Abs 9.6 (H) 1.7 - 7.7 K/uL   Lymphocytes Relative 15 12 - 46 %   Lymphs Abs 1.8 0.7 - 4.0 K/uL   Monocytes Relative 7 3 - 12 %   Monocytes Absolute 0.9 0.1 - 1.0 K/uL   Eosinophils Relative 2 0 - 5 %   Eosinophils Absolute 0.2 0.0 - 0.7 K/uL   Basophils Relative 0 0 - 1 %   Basophils Absolute 0.0 0.0 - 0.1 K/uL  Comprehensive metabolic panel     Status: Abnormal   Collection Time: 06/28/15 11:44 AM  Result Value Ref Range   Sodium 135 135 - 145 mmol/L   Potassium 4.5 3.5 - 5.1 mmol/L   Chloride 94 (L) 101 - 111 mmol/L   CO2 29 22 - 32 mmol/L   Glucose, Bld 177 (H) 65 - 99 mg/dL   BUN 44 (H) 6 - 20 mg/dL   Creatinine, Ser  1.44 (H) 0.61 - 1.24 mg/dL   Calcium 9.1 8.9 - 10.3 mg/dL   Total Protein 6.0 (L) 6.5 - 8.1 g/dL   Albumin 3.5 3.5 - 5.0 g/dL   AST 21 15 - 41 U/L   ALT 18 17 - 63 U/L   Alkaline Phosphatase 63 38 - 126 U/L   Total Bilirubin 0.8 0.3 - 1.2 mg/dL   GFR calc non Af Amer 45 (L) >60 mL/min   GFR calc Af Amer 53 (L) >60 mL/min    Comment: (NOTE) The eGFR has been calculated using the CKD EPI equation. This calculation has not been validated in all clinical situations. eGFR's persistently <60 mL/min signify possible Chronic Kidney Disease.    Anion gap 12 5 - 15  Troponin I     Status: None   Collection Time: 06/28/15 11:44 AM  Result Value Ref Range   Troponin I 0.03 <0.031 ng/mL    Comment:        NO INDICATION OF MYOCARDIAL INJURY.   Brain natriuretic peptide     Status: Abnormal   Collection Time: 06/28/15 11:44 AM  Result Value Ref Range   B Natriuretic Peptide 382.6 (H) 0.0 - 100.0 pg/mL  I-stat troponin, ED     Status: None   Collection Time: 06/28/15 11:52 AM  Result Value Ref Range   Troponin i, poc 0.02 0.00 - 0.08 ng/mL   Comment 3            Comment: Due to the release kinetics of cTnI, a negative result within the first hours of the onset  of symptoms does not rule out myocardial infarction with certainty. If myocardial infarction is still suspected, repeat the test at appropriate intervals.    Dg Chest 2 View  06/28/2015   CLINICAL DATA:  Shortness of breath since 0400 today, history CHF, ischemic cardiomyopathy, coronary artery disease, hypertension, type II diabetes mellitus, former smoker  EXAM: CHEST  2 VIEW  COMPARISON:  03/21/2015  FINDINGS: LEFT subclavian AICD lead projects at RIGHT ventricle unchanged.  Internal defibrillator leads/patches again identified.  Enlargement of cardiac silhouette post CABG.  Atherosclerotic calcifications of thoracoabdominal aorta.  Mediastinal contours and pulmonary vascularity normal.  Lungs clear.  No pleural effusion or  pneumothorax.  Bones demineralized.  Mild central peribronchial thickening.  IMPRESSION: Enlargement of cardiac silhouette post CABG and AICD.  Minimal chronic bronchitic changes.  No acute abnormalities.   Electronically Signed   By: Lavonia Dana M.D.   On: 06/28/2015 12:52   Dg Elbow Complete Right  06/28/2015   CLINICAL DATA:  Elbow pain and contusion after falling 4 days ago. Initial encounter.  EXAM: RIGHT ELBOW - COMPLETE 3+ VIEW  COMPARISON:  None.  FINDINGS: The mineralization and alignment are normal. There is no evidence of acute fracture or dislocation. There are mild degenerative changes with spurring of the coronoid process and lateral humeral epicondyle. No elbow joint effusion or foreign body demonstrated.  IMPRESSION: No acute osseous findings or joint effusion. Underlying degenerative changes.   Electronically Signed   By: Richardean Sale M.D.   On: 06/28/2015 12:51   Ct Head Wo Contrast  06/28/2015   CLINICAL DATA:  [place] weed[place]eating [place] to a [place], history TIA, type II diabetes mellitus, CHF, hypertension  EXAM: CT HEAD WITHOUT CONTRAST  TECHNIQUE: Contiguous axial images were obtained from the base of the skull through the vertex without intravenous contrast.  COMPARISON:  10/02/2014  FINDINGS: Normal ventricular morphology.  No midline shift or mass effect.  Normal appearance of brain parenchyma.  No intracranial hemorrhage, mass lesion, or evidence acute infarction.  Small old lacunar infarct LEFT basal ganglia unchanged.  No extra-axial fluid collections.  Extensive atherosclerotic calcifications of internal carotid and vertebral arteries at skullbase.  Bones and sinuses unremarkable.  IMPRESSION: Small old lacunar infarct LEFT basal ganglia.  No acute intracranial abnormalities.   Electronically Signed   By: Lavonia Dana M.D.   On: 06/28/2015 12:58    ROS: General:no colds or fevers, no weight changes Skin:no rashes or ulcers + abrasions of rt arm after fall.    HEENT:no blurred vision, no congestion CV:see HPI PUL:see HPI GI:no diarrhea constipation or melena, no indigestion GU:no hematuria, no dysuria MS:no joint pain, no claudication, + cervical neck pain Neuro:no syncope, + lightheadedness especially going from sitting to standing Endo:+ diabetes, no thyroid disease   Blood pressure 134/64, pulse 72, temperature 97.7 F (36.5 C), temperature source Oral, resp. rate 17, height $RemoveBe'5\' 8"'vZovqDzPA$  (1.727 m), weight 192 lb 5 oz (87.232 kg), SpO2 94 %. PE: General:Pleasant affect, NAD Skin:Warm and dry, brisk capillary refill HEENT:normocephalic, sclera clear, mucus membranes moist Neck:supple, no JVD, no bruits  Heart:S1S2 RRR without murmur, gallup, rub or click Lungs: with rales scattered, no rhonchi, or wheezes DPO:EUMP, non tender, + BS, do not palpate liver spleen or masses Ext:no lower ext edema, 2+ pedal pulses, 2+ radial pulses Neuro:alert and oriented X 3, MAE, follows commands, + facial symmetry    Assessment/Plan Principal Problem:   Acute on chronic systolic CHF (congestive heart failure)- BP aT TIMES  low, will slowly diuresis, though wt not up concern decreasing EF will check echo and serial troponins.  No IV heparin unless troponin elevated this seems to CHF.  Active Problems:   Type 2 diabetes mellitus- SSI    Cardiomyopathy, ischemic- chronic check echo   Automatic implantable cardioverter-defibrillator in situ   Chronic clinical systolic heart failure   Near syncope- check orthostatic BP.    [place] Nurse Practitioner Certified [place] Pager [phone] or after 5pm or weekends call [phone] 06/28/2015, 4:27 PM  I have seen and examined the patient along with [facility] R, NP.  I have reviewed the chart, notes and new data.  I agree with NP's note.  Key new complaints: classical description of exertional dyspnea and paroxysmal nocturnal dyspnea, but minimal leg edema and weight unchanged since  May office appointment Key examination changes: a few basilar rales, 1+ left ankle edema Key new findings / data: CXR clear, troponin normal, low risk ECG, normal ICD check on 06/23/15  PLAN: Diuretics, cautiously due to tendency for severe symptomatic orthostatic hypotension (this has also led to partial withdrawal of his CHF meds). Recheck echo. Since there is no clear volume overload by weight, there is concern for further deterioration in LV systolic function. Compression stockings may help. Suspicion for acute coronary syndrome is low.  Sanda Klein, MD, [place] [phone] 06/28/2015, 4:46 PM

## 2015-06-28 NOTE — ED Notes (Signed)
Cardiology at bedside.

## 2015-06-28 NOTE — Progress Notes (Addendum)
Admission note:  Arrival Method: Patient arrived to unit on a stretcher with wife and staff accompanying from ED Mental Orientation:  Alert and oriented x 4. Telemetry: 3E-14, NSR HR 67 Assessment: See doc flow sheets. Skin: Bruising noted on both bilateral arms, RUQ, and back left lower quadrant, right temporal.  Dry scabs noted on the hands and feet.  Skin tear on the Right upper back and also right forearm, wrapped with guaze.  Mepiplex put on the left forearm and also on bilateral elbows.  IV: Present and patent on the Left AC, which is saline locked. Pain: Denies any pain currently. Safety Measures: Bed in low position, call light and phone within reach. Fall Prevention Safety Plan: Reviewed the plan, understood and acknowledged. Admission Screening: In progress. 6700 Orientation: Patient has been oriented to the unit, staff and to the room.

## 2015-06-28 NOTE — ED Notes (Signed)
Patient transported to X-ray 

## 2015-06-29 ENCOUNTER — Encounter (HOSPITAL_COMMUNITY): Payer: Self-pay | Admitting: Physician Assistant

## 2015-06-29 ENCOUNTER — Telehealth: Payer: Self-pay | Admitting: Cardiovascular Disease

## 2015-06-29 ENCOUNTER — Inpatient Hospital Stay (HOSPITAL_COMMUNITY): Payer: Medicare Other

## 2015-06-29 DIAGNOSIS — I509 Heart failure, unspecified: Secondary | ICD-10-CM

## 2015-06-29 DIAGNOSIS — R0600 Dyspnea, unspecified: Secondary | ICD-10-CM

## 2015-06-29 LAB — BASIC METABOLIC PANEL
Anion gap: 9 (ref 5–15)
BUN: 41 mg/dL — AB (ref 6–20)
CHLORIDE: 97 mmol/L — AB (ref 101–111)
CO2: 29 mmol/L (ref 22–32)
Calcium: 8.6 mg/dL — ABNORMAL LOW (ref 8.9–10.3)
Creatinine, Ser: 1.35 mg/dL — ABNORMAL HIGH (ref 0.61–1.24)
GFR calc Af Amer: 57 mL/min — ABNORMAL LOW (ref >60)
GFR, EST NON AFRICAN AMERICAN: 49 mL/min — AB (ref >60)
Glucose, Bld: 221 mg/dL — ABNORMAL HIGH (ref 65–99)
POTASSIUM: 4.5 mmol/L (ref 3.5–5.1)
Sodium: 135 mmol/L (ref 135–145)

## 2015-06-29 LAB — GLUCOSE, CAPILLARY
GLUCOSE-CAPILLARY: 184 mg/dL — AB (ref 65–99)
GLUCOSE-CAPILLARY: 258 mg/dL — AB (ref 65–99)
Glucose-Capillary: 236 mg/dL — ABNORMAL HIGH (ref 65–99)
Glucose-Capillary: 330 mg/dL — ABNORMAL HIGH (ref 65–99)

## 2015-06-29 LAB — CBC
HCT: 31.1 % — ABNORMAL LOW (ref 39.0–52.0)
Hemoglobin: 10.4 g/dL — ABNORMAL LOW (ref 13.0–17.0)
MCH: 28.3 pg (ref 26.0–34.0)
MCHC: 33.4 g/dL (ref 30.0–36.0)
MCV: 84.7 fL (ref 78.0–100.0)
Platelets: 132 10*3/uL — ABNORMAL LOW (ref 150–400)
RBC: 3.67 MIL/uL — ABNORMAL LOW (ref 4.22–5.81)
RDW: 14.8 % (ref 11.5–15.5)
WBC: 7.2 10*3/uL (ref 4.0–10.5)

## 2015-06-29 LAB — HEMOGLOBIN A1C
Hgb A1c MFr Bld: 9.3 % — ABNORMAL HIGH (ref 4.8–5.6)
Mean Plasma Glucose: 220 mg/dL

## 2015-06-29 LAB — LIPID PANEL
CHOLESTEROL: 127 mg/dL (ref 0–200)
HDL: 36 mg/dL — AB (ref >40)
LDL Cholesterol: 69 mg/dL (ref 0–99)
TRIGLYCERIDES: 111 mg/dL (ref <150)
Total CHOL/HDL Ratio: 3.5 RATIO
VLDL: 22 mg/dL (ref 0–40)

## 2015-06-29 LAB — TROPONIN I: Troponin I: <0.03 ng/mL (ref <0.031)

## 2015-06-29 MED ORDER — INSULIN ASPART 100 UNIT/ML ~~LOC~~ SOLN
0.0000 [IU] | Freq: Three times a day (TID) | SUBCUTANEOUS | Status: DC
  Administered 2015-06-29 – 2015-06-30 (×3): 3 [IU] via SUBCUTANEOUS

## 2015-06-29 NOTE — Progress Notes (Signed)
Patient: Victor [name] / Admit Date: 06/28/2015 / Date of Encounter: 06/29/2015, 1:12 PM   Subjective: Still had episode of orthopnea early this AM but does feel like he's improved some. C/o abdominal bloating. No chest pain. Not dizzy today.   Objective: Telemetry: NSR with sinus arrhythmia vs wandering atrial pacemaker at times Physical Exam: Blood pressure 120/40, pulse 59, temperature 98.1 F (36.7 C), temperature source Oral, resp. rate 20, height 5\' 8"  (1.727 m), weight 187 lb 3.2 oz (84.913 kg), SpO2 100 %. General: Well developed, well nourished WM in no acute distress. Head: Normocephalic, atraumatic, sclera non-icteric, no xanthomas, nares are without discharge. Neck: Negative for carotid bruits. JVP not elevated. Lungs: Crackles at left base, coarse BS throughout, no wheezes or rhonchi. Breathing is unlabored. Heart: RRR S1 S2 without murmurs, rubs, or gallops.  Abdomen: Soft, non-tender but slightly rounded, with normoactive bowel sounds. No rebound/guarding. Extremities: No clubbing or cyanosis. No edema. Distal pedal pulses are 2+ and equal bilaterally. Neuro: Alert and oriented X 3. Moves all extremities spontaneously. Psych:  Responds to questions appropriately with a normal affect.   Intake/Output Summary (Last 24 hours) at 06/29/15 1312 Last data filed at 06/29/15 1249  Gross per 24 hour  Intake 488.33 ml  Output   2325 ml  Net -1836.67 ml    Inpatient Medications:  . amoxicillin-clavulanate  1 tablet Oral BID  . aspirin EC  81 mg Oral Daily  . bisoprolol  2.5 mg Oral Daily  . clopidogrel  75 mg Oral Daily  . etodolac  400 mg Oral Daily  . furosemide  40 mg Intravenous Q12H  . heparin  5,000 Units Subcutaneous 3 times per day  . insulin aspart  0-9 Units Subcutaneous TID WC  . lisinopril  2.5 mg Oral Daily  . mupirocin ointment  1 application Topical BID  . potassium chloride  20 mEq Oral BID  . pravastatin  40 mg Oral QHS  . pregabalin  150 mg Oral BID  .  vitamin B-12  1,000 mcg Oral Daily   Infusions:  . sodium chloride 10 mL/hr at 06/28/15 1810    Labs:  Recent Labs  06/28/15 1144 06/28/15 1900 06/29/15 0508  NA 135  --  135  K 4.5  --  4.5  CL 94*  --  97*  CO2 29  --  29  GLUCOSE 177*  --  221*  BUN 44*  --  41*  CREATININE 1.44* 1.34* 1.35*  CALCIUM 9.1  --  8.6*  MG  --  1.9  --     Recent Labs  06/28/15 1144  AST 21  ALT 18  ALKPHOS 63  BILITOT 0.8  PROT 6.0*  ALBUMIN 3.5    Recent Labs  06/28/15 1144 06/28/15 1900 06/29/15 0508  WBC 12.6* 8.7 7.2  NEUTROABS 9.6*  --   --   HGB 11.4* 10.8* 10.4*  HCT 34.4* 32.0* 31.1*  MCV 83.9 84.2 84.7  PLT 180 146* 132*    Recent Labs  06/28/15 1144 06/28/15 1900 06/28/15 2225 06/29/15 0508  TROPONINI 0.03 0.03 <0.03 <0.03   Invalid input(s): POCBNP  Recent Labs  06/28/15 1900  HGBA1C 9.3*     Radiology/Studies:  Dg Chest 2 View  06/28/2015   CLINICAL DATA:  Shortness of breath since 0400 today, history CHF, ischemic cardiomyopathy, coronary artery disease, hypertension, type II diabetes mellitus, former smoker  EXAM: CHEST  2 VIEW  COMPARISON:  03/21/2015  FINDINGS: LEFT subclavian AICD lead  projects at RIGHT ventricle unchanged.  Internal defibrillator leads/patches again identified.  Enlargement of cardiac silhouette post CABG.  Atherosclerotic calcifications of thoracoabdominal aorta.  Mediastinal contours and pulmonary vascularity normal.  Lungs clear.  No pleural effusion or pneumothorax.  Bones demineralized.  Mild central peribronchial thickening.  IMPRESSION: Enlargement of cardiac silhouette post CABG and AICD.  Minimal chronic bronchitic changes.  No acute abnormalities.   Electronically Signed   By: Lavonia Dana M.D.   On: 06/28/2015 12:52   Dg Elbow Complete Right  06/28/2015   CLINICAL DATA:  Elbow pain and contusion after falling 4 days ago. Initial encounter.  EXAM: RIGHT ELBOW - COMPLETE 3+ VIEW  COMPARISON:  None.  FINDINGS: The  mineralization and alignment are normal. There is no evidence of acute fracture or dislocation. There are mild degenerative changes with spurring of the coronoid process and lateral humeral epicondyle. No elbow joint effusion or foreign body demonstrated.  IMPRESSION: No acute osseous findings or joint effusion. Underlying degenerative changes.   Electronically Signed   By: Richardean Sale M.D.   On: 06/28/2015 12:51   Ct Head Wo Contrast  06/28/2015   CLINICAL DATA:  [place] weed[place]eating [place] to a [place], history TIA, type II diabetes mellitus, CHF, hypertension  EXAM: CT HEAD WITHOUT CONTRAST  TECHNIQUE: Contiguous axial images were obtained from the base of the skull through the vertex without intravenous contrast.  COMPARISON:  10/02/2014  FINDINGS: Normal ventricular morphology.  No midline shift or mass effect.  Normal appearance of brain parenchyma.  No intracranial hemorrhage, mass lesion, or evidence acute infarction.  Small old lacunar infarct LEFT basal ganglia unchanged.  No extra-axial fluid collections.  Extensive atherosclerotic calcifications of internal carotid and vertebral arteries at skullbase.  Bones and sinuses unremarkable.  IMPRESSION: Small old lacunar infarct LEFT basal ganglia.  No acute intracranial abnormalities.   Electronically Signed   By: Lavonia Dana M.D.   On: 06/28/2015 12:58     Assessment and Plan   1. Acute on chronic combined CHF/ICM EF 35-40% by echo 02/2015  - repeat echo was ordered this admission. Given lack of significant weight gain there is concern for worsening LVEF. Wt 192->187 today which is where he is at home per patient. However, he still had orthopnea overnight. Will continue IV Lasix today and f/u renal function/BNP in AM.  2. CKD stage III - most recent Cr variable. When admitted with CHF in 02/2015 he also had high Cr which improved to normal with diuresis. Most recent Cr 1.13 on 03/2015. Cr 1.44 on admission this time -> 1.35 with diuretic.  Hold Lodine while diuresing - should f/u with PCP for alternatives to NSAIDs.  2. Near-syncope with history of severe symptomatic orthostatic hypotension - follow, cautious with meds. Nursing to change dressing to skin tears today. Recheck orthostatics in AM.  4. CAD s/p CABG x 2 in 1990 with multiple PCIs since that time including CTO angioplasty (without stenting) PCI of RCA 09/2014, with last cath 02/2015 done for worsening CHF with unsuccessful attempt at PCI of the RCA due to inability to cross occlusion - medical therapy advised at that time. Troponins neg x3. Will continue medical therapy with ASA, BB, Plavix, statin.  5. Diabetes mellitus - add SSI. Uncontrolled A1C 9.3 thus will need to f/u PCP.  6. Normocytic anemia/thrombocytopenia - appears chronic.   Signed, Melina Copa PA-C Pager: [phone]   Patient seen and examined. Agree with assessment and plan. I/O -1308 since admission.  He had another episode of shortness of breath this afternoon. Continue IV diuresis. F/U BNP, BMET. Echo just completed; not yet available. Pt has OSA not yet on CPAP.   Troy Sine, MD, [facility] 06/29/2015 2:39 PM

## 2015-06-29 NOTE — Progress Notes (Signed)
  Echocardiogram 2D Echocardiogram has been performed.  Bobbye Charleston 06/29/2015, 2:43 PM

## 2015-06-29 NOTE — Telephone Encounter (Signed)
Victor [name] is calling in requesting that CBG and Insulin orders be placed for the pt because his blood sugar is over 200. Please assist in this   Thanks

## 2015-06-29 NOTE — Telephone Encounter (Signed)
Spoke to Dr. Loletha Grayer - advised to contact rounding physician team.  Gave instruction to [place], she voiced understanding.

## 2015-06-29 NOTE — Progress Notes (Signed)
Pt.. Discontinued IV to left AC. Page sent to IV team for restart. Unable to give scheduled IV lasix at this time. RN will inform Public house manager.

## 2015-06-29 NOTE — Progress Notes (Signed)
Inpatient Diabetes Program Recommendations  AACE/ADA: New Consensus Statement on Inpatient Glycemic Control (2013)  Target Ranges:  Prepandial:   less than 140 mg/dL      Peak postprandial:   less than 180 mg/dL (1-2 hours)      Critically ill patients:  140 - 180 mg/dL  Results for GEMAYEL, MASCIO (MRN [id number]) as of 06/29/2015 09:26  Ref. Range 06/28/2015 20:07 06/29/2015 06:27  Glucose-Capillary Latest Ref Range: 65-99 mg/dL 276 (H) 184 (H)    Diabetes history: DM2 Outpatient Diabetes medications: 70/30 10-30 units BID (sliding scale), Metformin 1000 mg BID Current orders for Inpatient glycemic control: None  Inpatient Diabetes Program Recommendations Correction (SSI): Please order CBGs and Novolog moderate correction scale ACHS. HgbA1C: A1C has been ordered.  Thanks, Barnie Alderman, RN, MSN, CCRN, CDE Diabetes Coordinator Inpatient Diabetes Program [phone] (Team Pager from [place] to [place]) [phone] ([place] office) [phone] [facility] office) [phone] [facility] office)

## 2015-06-30 ENCOUNTER — Telehealth: Payer: Self-pay | Admitting: Physician Assistant

## 2015-06-30 ENCOUNTER — Encounter (HOSPITAL_COMMUNITY): Payer: Self-pay | Admitting: Physician Assistant

## 2015-06-30 DIAGNOSIS — I5043 Acute on chronic combined systolic (congestive) and diastolic (congestive) heart failure: Principal | ICD-10-CM

## 2015-06-30 DIAGNOSIS — G4733 Obstructive sleep apnea (adult) (pediatric): Secondary | ICD-10-CM | POA: Insufficient documentation

## 2015-06-30 LAB — CBC
HCT: 31.5 % — ABNORMAL LOW (ref 39.0–52.0)
Hemoglobin: 10.3 g/dL — ABNORMAL LOW (ref 13.0–17.0)
MCH: 27.5 pg (ref 26.0–34.0)
MCHC: 32.7 g/dL (ref 30.0–36.0)
MCV: 84.2 fL (ref 78.0–100.0)
Platelets: 139 10*3/uL — ABNORMAL LOW (ref 150–400)
RBC: 3.74 MIL/uL — ABNORMAL LOW (ref 4.22–5.81)
RDW: 14.9 % (ref 11.5–15.5)
WBC: 6.1 10*3/uL (ref 4.0–10.5)

## 2015-06-30 LAB — BASIC METABOLIC PANEL
ANION GAP: 5 (ref 5–15)
BUN: 38 mg/dL — AB (ref 6–20)
CO2: 32 mmol/L (ref 22–32)
Calcium: 9 mg/dL (ref 8.9–10.3)
Chloride: 100 mmol/L — ABNORMAL LOW (ref 101–111)
Creatinine, Ser: 1.42 mg/dL — ABNORMAL HIGH (ref 0.61–1.24)
GFR calc Af Amer: 53 mL/min — ABNORMAL LOW (ref >60)
GFR, EST NON AFRICAN AMERICAN: 46 mL/min — AB (ref >60)
GLUCOSE: 240 mg/dL — AB (ref 65–99)
Potassium: 4.6 mmol/L (ref 3.5–5.1)
Sodium: 137 mmol/L (ref 135–145)

## 2015-06-30 LAB — BRAIN NATRIURETIC PEPTIDE: B NATRIURETIC PEPTIDE 5: 388.2 pg/mL — AB (ref 0.0–100.0)

## 2015-06-30 LAB — GLUCOSE, CAPILLARY
GLUCOSE-CAPILLARY: 210 mg/dL — AB (ref 65–99)
GLUCOSE-CAPILLARY: 232 mg/dL — AB (ref 65–99)

## 2015-06-30 MED ORDER — POTASSIUM CHLORIDE 20 MEQ PO PACK: 20.0000 meq | PACK | Freq: Every day | ORAL | Status: DC

## 2015-06-30 MED ORDER — FUROSEMIDE 40 MG PO TABS: 60.0000 mg | ORAL_TABLET | Freq: Two times a day (BID) | ORAL | Status: DC

## 2015-06-30 NOTE — Telephone Encounter (Signed)
TCM appt w/ Richardson Dopp on 8/16 @ 10am on [place] (pt wife aware of location)--pt of [place]

## 2015-06-30 NOTE — Progress Notes (Signed)
Inpatient Diabetes Program Recommendations  AACE/ADA: New Consensus Statement on Inpatient Glycemic Control (2013)  Target Ranges:  Prepandial:   less than 140 mg/dL      Peak postprandial:   less than 180 mg/dL (1-2 hours)      Critically ill patients:  140 - 180 mg/dL   Results for Victor [name], Victor [name] (MRN [id number]) as of 06/30/2015 08:23  Ref. Range 06/29/2015 06:27 06/29/2015 11:12 06/29/2015 16:08 06/29/2015 21:09 06/30/2015 06:08  Glucose-Capillary Latest Ref Range: 65-99 mg/dL 184 (H) 258 (H) 236 (H) 330 (H) 210 (H)   Diabetes history: DM2 Outpatient Diabetes medications: 70/30 10-30 units BID (sliding scale), Metformin 1000 mg BID Current orders for Inpatient glycemic control: Novolog 0-9 units TID with  Meals  Inpatient Diabetes Program Recommendations Insulin - Basal: Please consider ordering Lantus 8 units daily (starting now). Correction (SSI): Please consider adding Novolog bedtime correction scale. Insulin - Meal Coverage: Please consider ordering Novolog 4 units TID with meals for meal coverage (in addition to Novolog correction).  Thanks, Barnie Alderman, RN, MSN, CCRN, CDE Diabetes Coordinator Inpatient Diabetes Program [phone] (Team Pager from [place] to [place]) [phone] ([place] office) [phone] [facility] office) [phone] [facility] office)

## 2015-06-30 NOTE — Progress Notes (Signed)
Patient: Victor [name] / Admit Date: 06/28/2015 / Date of Encounter: 06/30/2015, 10:17 AM   Subjective: Feeling much better. Able to sleep last night. No SOB this AM. No LEE. Abdominal distention much improved.   Objective: Telemetry: NSR with sinus arrhythmia vs wandering atrial pacemaker at times Physical Exam: Blood pressure 138/60, pulse 62, temperature 98.1 F (36.7 C), temperature source Oral, resp. rate 16, height 5\' 8"  (1.727 m), weight 187 lb 3.2 oz (84.913 kg), SpO2 100 %. General: Well developed, well nourished WM in no acute distress. Head: Normocephalic, atraumatic, sclera non-icteric, no xanthomas, nares are without discharge. Neck: Negative for carotid bruits. JVP not elevated. Lungs: Crackles at left base, coarse BS throughout, no wheezes or rhonchi. Breathing is unlabored. Heart: RRR S1 S2 without murmurs, rubs, or gallops.  Abdomen: Soft, non-tender but slightly rounded, with normoactive bowel sounds. No rebound/guarding. Extremities: No clubbing or cyanosis. No edema. Distal pedal pulses are 2+ and equal bilaterally. Neuro: Alert and oriented X 3. Moves all extremities spontaneously. Psych: Responds to questions appropriately with a normal affect.   Intake/Output Summary (Last 24 hours) at 06/30/15 1017 Last data filed at 06/30/15 0855  Gross per 24 hour  Intake    960 ml  Output   3725 ml  Net  -2765 ml    Inpatient Medications:  . amoxicillin-clavulanate  1 tablet Oral BID  . aspirin EC  81 mg Oral Daily  . bisoprolol  2.5 mg Oral Daily  . clopidogrel  75 mg Oral Daily  . furosemide  40 mg Intravenous Q12H  . heparin  5,000 Units Subcutaneous 3 times per day  . insulin aspart  0-9 Units Subcutaneous TID WC  . lisinopril  2.5 mg Oral Daily  . mupirocin ointment  1 application Topical BID  . potassium chloride  20 mEq Oral BID  . pravastatin  40 mg Oral QHS  . pregabalin  150 mg Oral BID  . vitamin B-12  1,000 mcg Oral Daily   Infusions:  . sodium  chloride 10 mL/hr at 06/28/15 1810    Labs:  Recent Labs  06/28/15 1900 06/29/15 0508 06/30/15 0520  NA  --  135 137  K  --  4.5 4.6  CL  --  97* 100*  CO2  --  29 32  GLUCOSE  --  221* 240*  BUN  --  41* 38*  CREATININE 1.34* 1.35* 1.42*  CALCIUM  --  8.6* 9.0  MG 1.9  --   --     Recent Labs  06/28/15 1144  AST 21  ALT 18  ALKPHOS 63  BILITOT 0.8  PROT 6.0*  ALBUMIN 3.5    Recent Labs  06/28/15 1144  06/29/15 0508 06/30/15 0520  WBC 12.6*  < > 7.2 6.1  NEUTROABS 9.6*  --   --   --   HGB 11.4*  < > 10.4* 10.3*  HCT 34.4*  < > 31.1* 31.5*  MCV 83.9  < > 84.7 84.2  PLT 180  < > 132* 139*  < > = values in this interval not displayed.  Recent Labs  06/28/15 1144 06/28/15 1900 06/28/15 2225 06/29/15 0508  TROPONINI 0.03 0.03 <0.03 <0.03   Invalid input(s): POCBNP  Recent Labs  06/28/15 1900  HGBA1C 9.3*    BNP (last 3 results)  Recent Labs  03/11/15 0852 06/28/15 1144 06/30/15 0520  BNP 472.9* 382.6* 388.2*    ProBNP (last 3 results)  Recent Labs  09/23/14 0922 09/25/14 0505  09/30/14 1055  PROBNP 1645.0* 1195.0* 1605.0*    Radiology/Studies:  Dg Chest 2 View  06/28/2015   CLINICAL DATA:  Shortness of breath since 0400 today, history CHF, ischemic cardiomyopathy, coronary artery disease, hypertension, type II diabetes mellitus, former smoker  EXAM: CHEST  2 VIEW  COMPARISON:  03/21/2015  FINDINGS: LEFT subclavian AICD lead projects at RIGHT ventricle unchanged.  Internal defibrillator leads/patches again identified.  Enlargement of cardiac silhouette post CABG.  Atherosclerotic calcifications of thoracoabdominal aorta.  Mediastinal contours and pulmonary vascularity normal.  Lungs clear.  No pleural effusion or pneumothorax.  Bones demineralized.  Mild central peribronchial thickening.  IMPRESSION: Enlargement of cardiac silhouette post CABG and AICD.  Minimal chronic bronchitic changes.  No acute abnormalities.   Electronically Signed   By:  Lavonia Dana M.D.   On: 06/28/2015 12:52   Dg Elbow Complete Right  06/28/2015   CLINICAL DATA:  Elbow pain and contusion after falling 4 days ago. Initial encounter.  EXAM: RIGHT ELBOW - COMPLETE 3+ VIEW  COMPARISON:  None.  FINDINGS: The mineralization and alignment are normal. There is no evidence of acute fracture or dislocation. There are mild degenerative changes with spurring of the coronoid process and lateral humeral epicondyle. No elbow joint effusion or foreign body demonstrated.  IMPRESSION: No acute osseous findings or joint effusion. Underlying degenerative changes.   Electronically Signed   By: Richardean Sale M.D.   On: 06/28/2015 12:51   Ct Head Wo Contrast  06/28/2015   CLINICAL DATA:  [place] weed[place]eating [place] to a [place], history TIA, type II diabetes mellitus, CHF, hypertension  EXAM: CT HEAD WITHOUT CONTRAST  TECHNIQUE: Contiguous axial images were obtained from the base of the skull through the vertex without intravenous contrast.  COMPARISON:  10/02/2014  FINDINGS: Normal ventricular morphology.  No midline shift or mass effect.  Normal appearance of brain parenchyma.  No intracranial hemorrhage, mass lesion, or evidence acute infarction.  Small old lacunar infarct LEFT basal ganglia unchanged.  No extra-axial fluid collections.  Extensive atherosclerotic calcifications of internal carotid and vertebral arteries at skullbase.  Bones and sinuses unremarkable.  IMPRESSION: Small old lacunar infarct LEFT basal ganglia.  No acute intracranial abnormalities.   Electronically Signed   By: Lavonia Dana M.D.   On: 06/28/2015 12:58     Assessment and Plan  1. Acute on chronic combined CHF/ICM EF 35-40% by echo 02/2015 - repeat echo shows stable EF 35-40% with K of inferior myocardium. No evidence of elevated LV filling pressures. PASP mildly increased. Wt 192->187 today which is where he is at home per patient. -3.7L out. Suspect he can go home today. Will ambulate off O2 and ask  nursing to record in progress notes. Ask CM to see regarding CPAP - he was pending delivery of this and is worried about not having it when he goes home. Will discuss plans for discharge diuretic with MD. Previous dose of Lasix was 60mg  QAM/40mg  QPM but had reported more recently taking 80mg  TID? Was on 40mg  IV BID here.  2. CKD stage III - most recent Cr variable. When admitted with CHF in 02/2015 he also had high Cr which improved to normal with diuresis. Most recent Cr 1.13 on 03/2015. Cr 1.44 on admission this time -> 1.35 with diuretic. Hold Lodine while diuresing - should f/u original prescriber for alternatives to NSAIDs.  2. Near-syncope with history of severe symptomatic orthostatic hypotension - positive orthostatics yesterday afternoon. Will likely be adjusting meds as  above. Compression stockings recommended.   4. CAD s/p CABG x 2 in 1990 with multiple PCIs since that time including CTO angioplasty (without stenting) PCI of RCA 09/2014, with last cath 02/2015 done for worsening CHF with unsuccessful attempt at PCI of the RCA due to inability to cross occlusion - medical therapy advised at that time. Troponins neg x3. Will continue medical therapy with ASA, BB, Plavix, statin.  5. Diabetes mellitus - add SSI. Uncontrolled A1C 9.3 thus will need to f/u PCP. We can adjust meds if he is staying today.  6. Normocytic anemia/thrombocytopenia - appears chronic.   Signed, Melina Copa PA-C Pager: [phone]   Patient seen and examined. Agree with assessment and plan. Good diuresis yesterday;  I/O -3701 since admission. BNP today 388. Breathing better. No rales today on exam. Will plan for dc today with f/u Dr. [place]. Will need to be set up for CPAP if he has completed his CPAP titration study.    Troy Sine, MD, [facility] 06/30/2015 11:41 AM

## 2015-06-30 NOTE — Discharge Summary (Signed)
Discharge Summary   Patient ID: Victor [name] MRN: [id number], DOB/AGE: 1936/05/23 79 y.o. Admit date: 06/28/2015 D/C date:     06/30/2015  Primary Care Provider: Leonides Sake, MD Primary Cardiologist: Dr. Donneta Romberg Dr. Lovena Le  Primary Discharge Diagnoses:  1. Acute on chronic combined CHF/ICM EF 35-40% 2. CKD stage III 3. Near syncope with history of severe symptomatic orthostatic hypotension 4. CAD  - s/p CABG x 2 in 1990 with multiple PCIs since that time including CTO angioplasty (without stenting) PCI of RCA 09/2014, with last cath 02/2015 done for worsening CHF with unsuccessful attempt at PCI of the RCA due to inability to cross occlusion - medical therapy advised at that time 5. Diabetes mellitus uncontrolled A1C 9.3 6. Normocytic anemia/thrombocytopenia  7. Hypoxia on ambulation - O2 sat 81% -> discharged on 1L [place] with ambulation   PMH:  Past Medical History  Diagnosis Date  . Occlusion and stenosis of carotid artery without mention of cerebral infarction   . Raynaud's syndrome   . Paroxysmal ventricular tachycardia   . Peripheral vascular disease   . Cardiomyopathy, ischemic     a. s/p ICD placement  . Automatic implantable cardioverter-defibrillator in situ   . HLD (hyperlipidemia)   . GERD (gastroesophageal reflux disease)   . Chronic lower back pain   . Coronary artery disease     a. s/p CABG 1990. b. multiple PCIs since that time including CTO angioplasty (without stenting) PCI of RCA 09/2014, with last cath 02/2015 done for worsening CHF with unsuccessful attempt at PCI of the RCA due to inability to cross occlusion - medical therapy advised at that time.  . Hypertension   . Type II diabetes mellitus   . Arthritis   . TIA (transient ischemic attack)     a. s/p LHC on 10/01/14   . Chronic combined systolic and diastolic CHF (congestive heart failure)   . Stroke     a. CT head 06/2015: Small old lacunar infarct LEFT basal ganglia.  . Orthostatic hypotension   . CKD  (chronic kidney disease), stage III   . Anemia   . Thrombocytopenia      Hospital Course: Victor [name] is a 79 y/o M with history of CAD (CABG x 2 in 1990 with multiple PCIs since that time including CTO angioplasty -- without stenting -- PCI of RCA 09/2014, last cath 02/2015 done for worsening CHF with unsuccessful attempt at PCI of the RCA due to inability to cross occlusion), chronic combined CHF, DM, CKD stage III, HTN, HLD, PAD with carotid disease, VT 2012 s/p BS ICD who presented to [facility] with dyspnea, abdominal swelling, and classic orthopnea.   Symptoms were similar to 02/2015 when he was admitted with CHF requiring IV Lasix. At that time he also had new EKG changes on EKG with lateral T wave abnormalities.He underwent coronary angiogram 03/20/15 and had unsuccessful attempt at PCI of CTO RCA. Plan was for medical Rx. BNP was only in the 400 range at that time, but he symptomatically improved with diuresis.   His BNP this admission was 382. He also had reported some chest tightness to go along with the dyspnea but no overt chest pain. He fell last week with scraping of his skin on the right. He saw ortho and was treated. The patient also complained of near syncope and wife had noted some confusion at times, thus CT head was done which was negative for acute changes but did show occult stroke. Initial troponin  was negative and EKG was SR without acute changes. Dr. Sallyanne Kuster also reported recent normal ICD check on 06/23/15. Although the patient did not have clear uptrend in weight, he was felt to be volume overloaded, thus was admitted for IV Lasix. He was diuresed cautiously given his history of orthostasis. He did have positive orthostatics this admission (BP 161->09 systolic upon standing). It wasn't really clear what the patient was taking at home, diuretic wise. He had previously been on Lasix 60mg  QAM/40mg  QPM but had reported to the tech taking 80mg  TID. He was placed on IV Lasix 40mg   BID here and did well. Repeat echo showed stable EF 35-40% with HK of inferior myocardium. No evidence of elevated LV filling pressures. PASP mildly increased. His creatine remained in the 1.3-1.4 range. (When admitted with CHF in 02/2015 he also had high Cr which improved to normal with diuresis. Most recent prior was Cr 1.13 on 03/2015.) Today it is 1.42. Etodolac was held and he was advised to discuss alternatives with prescribing doctor given NSAIDS not preferred due to CKD. Also advised to f/u PCP for DM given A1C 9.3. He did have normocytic anemia/thrombocytopenia which appeared chronic and stable from before. He diuresed -5lb down to 187lb, -3.7L with definite symptomatic improvement. He has no rales on exam today and abdomen is softer. Orthopnea has resolved. He will be discharged on Lasix 60mg  BID per Dr. Evette Georges recommendation. KCl dose was reduced to once daily given K 4.5-4.6 range. Compression stockings were also recommended. The patient was concerned because he was recently dx with OSA and had not yet received his CPAP yet so we asked care management to assist prior to discharge and they will give him further instructions. O2 requirements were assessed prior to dc - he was 98% on RA at rest but did desat to 81% with ambulation, coming up to 98% on 1L. He was asymptomatic with this. He was no tachycardic or tachypneic and denied any dyspnea or CP today. Home O2 with ambulation will be arranged. Dr. Claiborne Billings has seen and examined the patient today and feels he is stable for discharge. Our scheduling line was persistently busy at the office so I have sent a message to our [facility] office's scheduler requesting a follow-up appointment, and our office will call the patient with this information.  Discharge Vitals: Blood pressure 107/35, pulse 61, temperature 98 F (36.7 C), temperature source Oral, resp. rate 16, height 5\' 8"  (1.727 m), weight 187 lb 3.2 oz (84.913 kg), SpO2 98 %.  Labs: Lab Results    Component Value Date   WBC 6.1 06/30/2015   HGB 10.3* 06/30/2015   HCT 31.5* 06/30/2015   MCV 84.2 06/30/2015   PLT 139* 06/30/2015    Recent Labs Lab 06/28/15 1144  06/30/15 0520  NA 135  < > 137  K 4.5  < > 4.6  CL 94*  < > 100*  CO2 29  < > 32  BUN 44*  < > 38*  CREATININE 1.44*  < > 1.42*  CALCIUM 9.1  < > 9.0  PROT 6.0*  --   --   BILITOT 0.8  --   --   ALKPHOS 63  --   --   ALT 18  --   --   AST 21  --   --   GLUCOSE 177*  < > 240*  < > = values in this interval not displayed.  Recent Labs  06/28/15 1144 06/28/15 1900 06/28/15 2225  06/29/15 0508  TROPONINI 0.03 0.03 <0.03 <0.03   Lab Results  Component Value Date   CHOL 127 06/29/2015   HDL 36* 06/29/2015   LDLCALC 69 06/29/2015   TRIG 111 06/29/2015   Diagnostic Studies/Procedures   Dg Chest 2 View  06/28/2015   CLINICAL DATA:  Shortness of breath since 0400 today, history CHF, ischemic cardiomyopathy, coronary artery disease, hypertension, type II diabetes mellitus, former smoker  EXAM: CHEST  2 VIEW  COMPARISON:  03/21/2015  FINDINGS: LEFT subclavian AICD lead projects at RIGHT ventricle unchanged.  Internal defibrillator leads/patches again identified.  Enlargement of cardiac silhouette post CABG.  Atherosclerotic calcifications of thoracoabdominal aorta.  Mediastinal contours and pulmonary vascularity normal.  Lungs clear.  No pleural effusion or pneumothorax.  Bones demineralized.  Mild central peribronchial thickening.  IMPRESSION: Enlargement of cardiac silhouette post CABG and AICD.  Minimal chronic bronchitic changes.  No acute abnormalities.   Electronically Signed   By: Lavonia Dana M.D.   On: 06/28/2015 12:52   Dg Elbow Complete Right  06/28/2015   CLINICAL DATA:  Elbow pain and contusion after falling 4 days ago. Initial encounter.  EXAM: RIGHT ELBOW - COMPLETE 3+ VIEW  COMPARISON:  None.  FINDINGS: The mineralization and alignment are normal. There is no evidence of acute fracture or dislocation.  There are mild degenerative changes with spurring of the coronoid process and lateral humeral epicondyle. No elbow joint effusion or foreign body demonstrated.  IMPRESSION: No acute osseous findings or joint effusion. Underlying degenerative changes.   Electronically Signed   By: Richardean Sale M.D.   On: 06/28/2015 12:51   Ct Head Wo Contrast  06/28/2015   CLINICAL DATA:  [place] to [place], history TIA, type II diabetes mellitus, CHF, hypertension  EXAM: CT HEAD WITHOUT CONTRAST  TECHNIQUE: Contiguous axial images were obtained from the base of the skull through the vertex without intravenous contrast.  COMPARISON:  10/02/2014  FINDINGS: Normal ventricular morphology.  No midline shift or mass effect.  Normal appearance of brain parenchyma.  No intracranial hemorrhage, mass lesion, or evidence acute infarction.  Small old lacunar infarct LEFT basal ganglia unchanged.  No extra-axial fluid collections.  Extensive atherosclerotic calcifications of internal carotid and vertebral arteries at skullbase.  Bones and sinuses unremarkable.  IMPRESSION: Small old lacunar infarct LEFT basal ganglia.  No acute intracranial abnormalities.   Electronically Signed   By: Lavonia Dana M.D.   On: 06/28/2015 12:58   2D echo as above  Discharge Medications   Current Discharge Medication List    CONTINUE these medications which have CHANGED   Details  furosemide (LASIX) 40 MG tablet Take 1.5 tablets (60 mg total) by mouth 2 (two) times daily. Qty: 90 tablet, Refills: 2   Associated Diagnoses: Chronic clinical systolic heart failure    potassium chloride (KLOR-CON) 20 MEQ packet Take 20 mEq by mouth daily. Qty: 30 packet, Refills: 3   Associated Diagnoses: Cardiomyopathy, ischemic      CONTINUE these medications which have NOT CHANGED   Details  amoxicillin-clavulanate (AUGMENTIN) 500-125 MG per tablet Take 1 tablet by mouth 2 (two) times daily.    aspirin (ASPIR-81) 81 MG EC  tablet Take 81 mg by mouth at bedtime.     bisoprolol (ZEBETA) 5 MG tablet Take 0.5 tablets (2.5 mg total) by mouth daily.    Associated Diagnoses: Cardiomyopathy, ischemic    clopidogrel (PLAVIX) 75 MG tablet TAKE 1 TABLET EVERY DAY WITH BREAKFAST  dexamethasone (DECADRON) 4 MG tablet Take 4 mg by mouth daily as needed.     HYDROcodone-acetaminophen (NORCO) 10-325 MG per tablet Take 1 tablet by mouth every 8 (eight) hours as needed for moderate pain.      insulin aspart protamine- aspart (NOVOLOG MIX 70/30) (70-30) 100 UNIT/ML injection Inject 10-30 Units into the skin 2 (two) times daily with a meal. Sliding scale    lisinopril (PRINIVIL,ZESTRIL) 2.5 MG tablet Take 1 tablet (2.5 mg total) by mouth daily.     LYRICA 150 MG capsule Take 150 mg by mouth 2 (two) times daily.     metFORMIN (GLUCOPHAGE) 1000 MG tablet Take 1,000 mg by mouth 2 (two) times daily with a meal.    mupirocin ointment (BACTROBAN) 2 % Apply 1 application topically 2 (two) times daily.     nitroGLYCERIN (NITROSTAT) 0.4 MG SL tablet Place 0.4 mg under the tongue every 5 (five) minutes as needed for chest pain.    pravastatin (PRAVACHOL) 40 MG tablet Take 40 mg by mouth at bedtime.     SANTYL ointment Apply 1 application topically every other day.     vitamin B-12 (CYANOCOBALAMIN) 1000 MCG tablet Take 1,000 mcg by mouth daily.      STOP taking these medications     etodolac (LODINE) 400 MG tablet      Oxycodone HCl 10 MG TABS - patient and wife reports he no longer takes this at home. Spoke with them about dangers of mixing pain meds. He previously tried oxycodone in place of hydrocodone but it didn't work as well for him so he stopped taking it.          Disposition   The patient will be discharged in stable condition to home. Discharge Instructions    Diet - low sodium heart healthy    Complete by:  As directed   Diabetic diet     Increase activity slowly    Complete by:  As directed   -  Your doctor has recommended you use COMPRESSION STOCKINGS daily. - Please note a change in your Lasix dose. - Your potassium dose was decreased to once per day. - Your Lodine (etodolac) was stopped due to your kidney numbers - this medicine can cause worsening of kidney function in patients like yourself. Please talk to the prescribing doctor about alternatives. - Home oxygen has been arranged for you - you will use 1 liter by nasal cannula with exertion.          Follow-up Information    Follow up with Peter [place], MD.   Specialty:  Cardiology   Why:  Office will call you for your followup appointment. Call office if you have not heard back in 3 days.   Contact information:   [address] [place] [address] [phone]       Follow up with [facility], MD.   Specialty:  Family Medicine   Why:  Your diabetes is not well controlled - A1c was 9.3 and should be less than 7.0. Please make appointment with your diabetes doctor to discuss adjustment of your regimen.   Contact information:   Dr. Daiva Eves [address] [place] [address] [phone]       Follow up with For Your Information.   Why:  Your etodolac (Lodine) was stopped. This medicine is not good for people with kidney dysfunction. Please talk to prescribing doctor about alternatives.        Duration of Discharge  Encounter: Greater than 30 minutes including physician and PA time.  Rudean Hitt, Seann Genther PA-C 06/30/2015, 2:01 PM

## 2015-06-30 NOTE — Progress Notes (Signed)
SATURATION QUALIFICATIONS: (This note is used to comply with regulatory documentation for home oxygen)  Patient Saturations on Room Air at Rest =96%  Patient Saturations on Room Air while Ambulating = 81%  Patient Saturations on1 Liters of oxygen while Ambulating =98%  Please briefly explain why patient needs home oxygen:

## 2015-06-30 NOTE — Progress Notes (Signed)
Home 02 ordered and to be delivered to the room today prior to discharging home; Aneta Mins [phone]

## 2015-06-30 NOTE — Progress Notes (Signed)
At 1631 pt given all d/c instructions.  Verbalized understanding.  D/c off floor via w/c to awaiting transport.  Wife present.  Karie Kirks, Therapist, sports.

## 2015-06-30 NOTE — Progress Notes (Signed)
Talked to patient about his CPAP machine that was ordered by Dr Eulas Post but has not arrived at his home yet. Per spouse, the physician was having some problems with the paperwork for the completion of the CPAP machine; sleep study completed 3 wks ago. Pt / spouse stated that they will follow up with Dr Eulas Post for the CPAP machine. Patient is to possibly qualify for home 02; Awaiting on bedside nurse to enter qualifying saturations for home 02. Mindi Slicker [facility] [phone]

## 2015-07-01 LAB — CUP PACEART REMOTE DEVICE CHECK
Battery Remaining Longevity: 126 mo
Battery Remaining Percentage: 100 %
Brady Statistic RV Percent Paced: 0 %
HighPow Impedance: 58 Ohm
Lead Channel Impedance Value: 490 Ohm
Lead Channel Pacing Threshold Amplitude: 1 V
Lead Channel Setting Pacing Amplitude: 2.4 V
Lead Channel Setting Sensing Sensitivity: 0.4 mV
MDC IDC MSMT LEADCHNL RV PACING THRESHOLD PULSEWIDTH: 0.4 ms
MDC IDC SESS DTM: 20160802063500
MDC IDC SET LEADCHNL RV PACING PULSEWIDTH: 0.4 ms
MDC IDC SET ZONE DETECTION INTERVAL: 364 ms
Pulse Gen Serial Number: 118170
Zone Setting Detection Interval: 273 ms
Zone Setting Detection Interval: 308 ms

## 2015-07-01 NOTE — Telephone Encounter (Signed)
LMTCB

## 2015-07-01 NOTE — Telephone Encounter (Signed)
lmtcb

## 2015-07-01 NOTE — Telephone Encounter (Signed)
Patient contacted regarding discharge from [facility] on 06/30/2015.  Patient understands to follow up with provider Richardson Dopp on 07/07/2015 at [place]  at [facility]. Patient understands discharge instructions? yes  Patient understands medications and regiment? yes  Patient understands to bring all medications to this visit? yes   Spoke with wife and patient is still a little weak but doing ok

## 2015-07-01 NOTE — Telephone Encounter (Signed)
Returning a call from the clinical staff---please call [phone]

## 2015-07-06 NOTE — Progress Notes (Signed)
Cardiology Office Note   Date:  07/07/2015   ID:  LOGON UTTECH, DOB 03/22/1936, MRN [id number]  PCP:  Leonides Sake, MD  Cardiologist:  Dr. Carleene Overlie Taylor/Dr. Peter Martinique   Electrophysiologist:  Dr. Cristopher Peru    Chief Complaint  Patient presents with  . Hospitalization Follow-up    CHF     History of Present Illness: Victor [name] is a 79 y.o. male with a hx of CAD, status post CABG in 1990 with known occlusion of both vein grafts, PCI to the SVG-OM3 and 2014 and CTO PCI of the RCA 09/2014 (angioplasty only-no stenting), chronic combined systolic and diastolic CHF, ischemic cardiomyopathy with history of VT in 2012, status post ICD, diabetes, CKD, HTN, HL, PAD, carotid stenosis. Admitted 02/2015 with volume overload. He had new EKG changes and underwent LHC with unsuccessful attempt at PCI of CTO RCA. Medical therapy was continued.  Admitted 8/7-8/9 with acute on chronic combined systolic and diastolic CHF. Patient has a history of orthostatic hypotension. He was diuresed cautiously with IV Lasix. Follow-up echo demonstrated no significant change with an EF of 35-40%. Discharge weight 187 pounds. The patient is noted to have orthostatic blood pressure drop (120>> 95 with standing). Compression stockings were recommended at discharge. He did have oxygen desaturation with ambulation and was discharged home on O2.  He returns for FU.  He is here with his wife.  He is not wearing his O2.  O2 in our office today is 83% on RA.  We put him back on O2 and it came up to 99%.  He is sleeping better at night and his breathing is, overall, improved.  Although he is overall NYHA 3. He has chronic chest pressure.  It is not exertional. He has had these symptoms for a long time and denies any changes.  He denies orthopnea, PND.  LE edema is somewhat worse (L is always > than R).  He denies syncope.  He was lightheaded and fell to his knees this AM.  He was not lightheaded.  He was not wearing his O2  at the time.  Weights at home have been stable. He wants to get an epidural steroid injection for his chronic back pain.     Studies/Reports Reviewed Today:  Echo 06/29/15 Mild LVH, EF 35-40%, inferior HK, mild MR, mild LAE, PASP 42 mmHg Impressions: - Compared to the prior study, there has been no significantinterval change.  LHC 03/20/15 Left mainstem: Normal  LAD: Proximal 30-40%, D1 ostial 80%.  LCx: The LCx is occluded distally. The first and second OM branches are occluded.  RCA: Proximal severely calcified 90%, mid occluded with what appeared to be a micro channel through the lesion with filling distally as well as some left to right collaterals.  PCI Data: Unable to cross with a wire.  Final Conclusions:  1. Severe 2 vessel obstructive CAD with chronic occlusion of OMs and RCA 2. Known occlusion of SVGs from prior caths. 3. Normal right heart and LV filling pressures. 4. Unsuccessful attempt at PCI of the RCA due to inability to cross occlusion in the mid vessel after the RV marginal branch. Recommendations: Continue medical therapy. Will review with Dr. Irish Lack to see if there are any further options for CTO PCI. If stable patient can be DC this weekend.   Myoview 02/05/14 High risk stress nuclear study with a large, severe intensity, partially reversible inferior lateral defect consistent with prior infarct and mild peri-infarct ischemia; possible transient ischemic  dilatation of LV cavity.  LV Ejection Fraction: 25%. LV Wall Motion: Akinesis of the inferior lateral wall.    Past Medical History  Diagnosis Date  . Occlusion and stenosis of carotid artery without mention of cerebral infarction   . Raynaud's syndrome   . Paroxysmal ventricular tachycardia   . Peripheral vascular disease   . Cardiomyopathy, ischemic     a. s/p ICD placement  . Automatic implantable cardioverter-defibrillator in situ   . HLD (hyperlipidemia)   . GERD (gastroesophageal reflux disease)     . Chronic lower back pain   . Coronary artery disease     a. s/p CABG 1990. b. multiple PCIs since that time including CTO angioplasty (without stenting) PCI of RCA 09/2014, with last cath 02/2015 done for worsening CHF with unsuccessful attempt at PCI of the RCA due to inability to cross occlusion - medical therapy advised at that time.  . Hypertension   . Type II diabetes mellitus   . Arthritis   . TIA (transient ischemic attack)     a. s/p LHC on 10/01/14   . Chronic combined systolic and diastolic CHF (congestive heart failure)   . Stroke     a. CT head 06/2015: Small old lacunar infarct LEFT basal ganglia.  . Orthostatic hypotension   . CKD (chronic kidney disease), stage III   . Anemia   . Thrombocytopenia     Past Surgical History  Procedure Laterality Date  . Anal fistulectomy  2000's  . Cataract extraction, bilateral Bilateral ~ 2009  . Cardiac defibrillator placement  02/1989-12/2011    "I've had a total of 6 or 7 defibrillators put in" (09/30/2014)  . Finger fracture surgery Left ~ 2000    "pointer"  . Coronary artery bypass graft  01/1989    "CABG X3"   . Fracture surgery    . Coronary angioplasty with stent placement  04/2013    "1"  . Cardiac catheterization  07/2014  . Coronary angioplasty  10/01/14    PTVA to CTO with restoration of TIMI 3  . Implantable cardioverter defibrillator (icd) generator change Left 10/05/2011    Procedure: ICD GENERATOR CHANGE;  Surgeon: Evans Lance, MD;  Location: [facility] CATH LAB;  Service: Cardiovascular;  Laterality: Left;  . Left and right heart catheterization with coronary/graft angiogram N/A 04/26/2013    Procedure: LEFT AND RIGHT HEART CATHETERIZATION WITH Beatrix Fetters;  Surgeon: Sherren Mocha, MD;  Location: [facility] CATH LAB;  Service: Cardiovascular;  Laterality: N/A;  . Percutaneous stent intervention  04/26/2013    Procedure: PERCUTANEOUS STENT INTERVENTION;  Surgeon: Sherren Mocha, MD;  Location: [facility] CATH LAB;  Service:  Cardiovascular;;  . Left heart catheterization with coronary angiogram N/A 08/01/2014    Procedure: LEFT HEART CATHETERIZATION WITH CORONARY ANGIOGRAM;  Surgeon: Sinclair Grooms, MD;  Location: [facility] CATH LAB;  Service: Cardiovascular;  Laterality: N/A;  . Right heart catheterization  10/01/2014    Procedure: RIGHT HEART CATH;  Surgeon: Peter M Martinique, MD;  Location: [facility] CATH LAB;  Service: Cardiovascular;;  . Cardiac catheterization  10/01/2014    Procedure: CORONARY BALLOON ANGIOPLASTY;  Surgeon: Peter M Martinique, MD;  Location: [facility] CATH LAB;  Service: Cardiovascular;;  . Left heart catheterization with coronary angiogram N/A 03/20/2015    Procedure: LEFT HEART CATHETERIZATION WITH CORONARY ANGIOGRAM;  Surgeon: Peter M Martinique, MD;  Location: [facility] CATH LAB;  Service: Cardiovascular;  Laterality: N/A;     Current Outpatient Prescriptions  Medication Sig Dispense Refill  . amoxicillin-clavulanate (  AUGMENTIN) 500-125 MG per tablet Take 1 tablet by mouth 2 (two) times daily.    Marland Kitchen aspirin (ASPIR-81) 81 MG EC tablet Take 81 mg by mouth at bedtime.     . bisoprolol (ZEBETA) 5 MG tablet Take 0.5 tablets (2.5 mg total) by mouth daily. 45 tablet 3  . clopidogrel (PLAVIX) 75 MG tablet TAKE 1 TABLET EVERY DAY WITH BREAKFAST 90 tablet 3  . dexamethasone (DECADRON) 4 MG tablet Take 4 mg by mouth daily as needed.  0  . furosemide (LASIX) 40 MG tablet Take 1.5 tablets (60 mg total) by mouth 2 (two) times daily. 90 tablet 2  . HYDROcodone-acetaminophen (NORCO) 10-325 MG per tablet Take 1 tablet by mouth every 8 (eight) hours as needed for moderate pain.   0  . insulin aspart protamine- aspart (NOVOLOG MIX 70/30) (70-30) 100 UNIT/ML injection Inject 10-30 Units into the skin 2 (two) times daily with a meal. Sliding scale    . lisinopril (PRINIVIL,ZESTRIL) 2.5 MG tablet Take 1 tablet (2.5 mg total) by mouth daily. 90 tablet 2  . LYRICA 150 MG capsule Take 150 mg by mouth daily.   1  . metFORMIN (GLUCOPHAGE) 1000 MG  tablet Take 1,000 mg by mouth 2 (two) times daily with a meal.    . mupirocin ointment (BACTROBAN) 2 % Apply 1 application topically 2 (two) times daily.  3  . nitroGLYCERIN (NITROSTAT) 0.4 MG SL tablet Place 0.4 mg under the tongue every 5 (five) minutes as needed for chest pain.    . ONE TOUCH ULTRA TEST test strip CHECK SUGAR AS DIRECTED 3 TIMES DAILY  5  . potassium chloride (KLOR-CON) 20 MEQ packet Take 20 mEq by mouth daily. 30 packet 3  . pravastatin (PRAVACHOL) 40 MG tablet Take 40 mg by mouth at bedtime.     Marland Kitchen SANTYL ointment Apply 1 application topically every other day.  2  . vitamin B-12 (CYANOCOBALAMIN) 1000 MCG tablet Take 1,000 mcg by mouth daily.     No current facility-administered medications for this visit.    Allergies:   Coreg; Lovastatin; and Ativan    Social History:  The patient  reports that he has quit smoking. His smoking use included Pipe. He has never used smokeless tobacco. He reports that he drinks alcohol. He reports that he does not use illicit drugs.   Family History:  The patient's family history includes Heart attack in his father; Heart failure (age of onset: 81) in his mother; Leukemia in his sister; Stomach cancer in his maternal grandfather.    ROS:   Please see the history of present illness.   Review of Systems  Constitution: Positive for decreased appetite and malaise/fatigue.  HENT: Positive for headaches.   Cardiovascular: Positive for chest pain, dyspnea on exertion and leg swelling.  Respiratory: Positive for shortness of breath and wheezing.   Hematologic/Lymphatic: Positive for bleeding problem. Bruises/bleeds easily.  Musculoskeletal: Positive for back pain and joint pain.  Gastrointestinal: Positive for bloating.  Neurological: Positive for dizziness and loss of balance.  Psychiatric/Behavioral: The patient is nervous/anxious.   All other systems reviewed and are negative.     PHYSICAL EXAM: VS:  BP 112/48 mmHg  Pulse 66  Ht 5'  8" (1.727 m)  Wt 193 lb (87.544 kg)  BMI 29.35 kg/m2  SpO2 83%    Wt Readings from Last 3 Encounters:  07/07/15 193 lb (87.544 kg)  06/30/15 187 lb 3.2 oz (84.913 kg)  04/26/15 181 lb (82.101 kg)  GEN: Well nourished, well developed, in no acute distress HEENT: normal Neck: no JVD,   no masses Cardiac:  Normal S1/S2, RRR; no murmur ,  no rubs or gallops, 1+ LLE edema, no RLE edema Respiratory:  Decreased breath sounds bilaterally, no wheezing, rhonchi or rales. GI: soft, nontender, nondistended, + BS MS: no deformity or atrophy Skin: warm and dry  Neuro:  CNs II-XII intact, Strength and sensation are intact Psych: Normal affect   EKG:  EKG is ordered today.  It demonstrates:   NSR, HR 66, first-degree AV block, PR interval 260 ms, low voltage, inferior Q waves, no change from prior tracing   Recent Labs: 06/28/2015: ALT 18; Magnesium 1.9; TSH 1.001 06/30/2015: B Natriuretic Peptide 388.2*; Hemoglobin 10.3*; Platelets 139* 07/07/2015: BUN 31*; Creatinine, Ser 1.33; Potassium 4.1; Pro B Natriuretic peptide (BNP) 473.0*; Sodium 138    Lipid Panel    Component Value Date/Time   CHOL 127 06/29/2015 0508   TRIG 111 06/29/2015 0508   HDL 36* 06/29/2015 0508   CHOLHDL 3.5 06/29/2015 0508   VLDL 22 06/29/2015 0508   LDLCALC 69 06/29/2015 0508      ASSESSMENT AND PLAN:  Chronic combined systolic and diastolic CHF (congestive heart failure):  Overall, volume appears to be stable. He does have issues with orthostatic hypotension. His weight has been fairly stable since discharge from the hospital. He does not look particularly volume overloaded on exam. I would be cautious with increasing his diuretics because of his history of orthostatic hypotension. He did get weak and fall this morning. He was not wearing his oxygen at that  time. Compression stockings were recommended in the hospital. He has not yet started on these. I will obtain a BMET, BNP today. If his BNP is significantly  elevated from prior readings, I will consider increasing his Lasix. I will review further with Dr. Martinique whether or not we should refer him to the heart failure clinic. I have asked him to continue on oxygen therapy at all times.    Cardiomyopathy, ischemic:  Recent EF 35-40% by echocardiogram. Continue beta blocker, ACE inhibitor. Could consider adding spironolactone at some point in the near future.  Coronary artery disease involving native coronary artery of native heart without angina pectoris:  He has chronic chest pain. This is overall stable without significant change. He had a recent heart catheterization demonstrating severe 2 vessel CAD with chronic occlusion of the obtuse marginal and RCA with unsuccessful attempt at PCI of the RCA. Continue aspirin, beta blocker, Plavix, statin.  Essential hypertension:  Controlled.  Orthostatic hypotension:  I have asked him to go ahead and get compression stockings to help with his orthostatic hypotension.   Hyperlipidemia:    continue statin.  ICD- BS Nov 2012 for VT:  Follow-up with EP as planned.   Type 2 diabetes mellitus with other circulatory complications:  Follow-up with primary care.  CKD (chronic kidney disease), unspecified stage:  Obtain follow-up BMET today.   Back pain:  He wants to undergo epidural sterile injection. I will review further with Dr. Martinique.   I suspect that there should be no reason why he cannot proceed.      Medication Changes: Current medicines are reviewed at length with the patient today.  Concerns regarding medicines are as outlined above.  The following changes have been made:   Discontinued Medications   No medications on file   Modified Medications   No medications on file   New Prescriptions   No  medications on file    Labs/ tests ordered today include:   Orders Placed This Encounter  Procedures  . Basic Metabolic Panel (BMET)  . B Nat Peptide  . EKG 12-Lead     Disposition:    FU  with Dr. Peter Martinique 07/24/15    Signed, Richardson Dopp, PA-C, MHS 07/07/2015 4:32 PM    [facility] [address], [place], [place]  [address] Phone: [phone]; Fax: [phone]

## 2015-07-07 ENCOUNTER — Ambulatory Visit (INDEPENDENT_AMBULATORY_CARE_PROVIDER_SITE_OTHER): Payer: Medicare Other | Admitting: Physician Assistant

## 2015-07-07 ENCOUNTER — Telehealth: Payer: Self-pay | Admitting: Physician Assistant

## 2015-07-07 ENCOUNTER — Encounter: Payer: Self-pay | Admitting: Physician Assistant

## 2015-07-07 VITALS — BP 112/48 | HR 66 | Ht 68.0 in | Wt 193.0 lb

## 2015-07-07 DIAGNOSIS — I255 Ischemic cardiomyopathy: Secondary | ICD-10-CM

## 2015-07-07 DIAGNOSIS — I10 Essential (primary) hypertension: Secondary | ICD-10-CM

## 2015-07-07 DIAGNOSIS — E1159 Type 2 diabetes mellitus with other circulatory complications: Secondary | ICD-10-CM

## 2015-07-07 DIAGNOSIS — I5042 Chronic combined systolic (congestive) and diastolic (congestive) heart failure: Secondary | ICD-10-CM | POA: Diagnosis not present

## 2015-07-07 DIAGNOSIS — Z95810 Presence of automatic (implantable) cardiac defibrillator: Secondary | ICD-10-CM

## 2015-07-07 DIAGNOSIS — I5041 Acute combined systolic (congestive) and diastolic (congestive) heart failure: Secondary | ICD-10-CM

## 2015-07-07 DIAGNOSIS — E785 Hyperlipidemia, unspecified: Secondary | ICD-10-CM

## 2015-07-07 DIAGNOSIS — I2510 Atherosclerotic heart disease of native coronary artery without angina pectoris: Secondary | ICD-10-CM

## 2015-07-07 DIAGNOSIS — I951 Orthostatic hypotension: Secondary | ICD-10-CM

## 2015-07-07 DIAGNOSIS — N189 Chronic kidney disease, unspecified: Secondary | ICD-10-CM

## 2015-07-07 LAB — BRAIN NATRIURETIC PEPTIDE: Pro B Natriuretic peptide (BNP): 473 pg/mL — ABNORMAL HIGH (ref 0.0–100.0)

## 2015-07-07 LAB — BASIC METABOLIC PANEL
BUN: 31 mg/dL — AB (ref 6–23)
CO2: 34 mEq/L — ABNORMAL HIGH (ref 19–32)
CREATININE: 1.33 mg/dL (ref 0.40–1.50)
Calcium: 9.3 mg/dL (ref 8.4–10.5)
Chloride: 98 mEq/L (ref 96–112)
GFR: 55.36 mL/min — AB (ref >60.00)
Glucose, Bld: 149 mg/dL — ABNORMAL HIGH (ref 70–99)
Potassium: 4.1 mEq/L (ref 3.5–5.1)
Sodium: 138 mEq/L (ref 135–145)

## 2015-07-07 NOTE — Telephone Encounter (Signed)
Please tell patient that I reviewed his case with Dr. Peter Martinique.  It is ok for him to have his steroid injection.  If they need him to hold Plavix for his injection, that is ok.  Tell him to call the surgeon's office and ask if he needs to hold Plavix.  If so, he can hold 7 days before the procedure and resume after when ok with the surgeon.  Also - we want him to be referred to [facility]. Please refer to Linna Hoff or Kirk Ruths in the HF Clinic.  Thanks, Richardson Dopp, PA-C   07/07/2015 5:43 PM

## 2015-07-07 NOTE — Patient Instructions (Signed)
Medication Instructions:  Your physician recommends that you continue on your current medications as directed. Please refer to the Current Medication list given to you today.   Labwork: TODAY BMET, BNP  Testing/Procedures: NONE  Follow-Up: DR. Martinique 07/24/15  Any Other Special Instructions Will Be Listed Below (If Applicable). 1. MAKE SURE TO WEAR YOUR OXYGEN AT ALL TIMES   2. GET OTC COMPRESSION STOCKINGS AND WEAR THEM FROM THE TIME YOU WAKE UP UNTIL THE TIME YOU GO TO BED  3. CALL IF YOUR WEIGHT IS UP 3 LB'S IN 1 DAY OR 5 LB'S IN 1 WEEK [phone]

## 2015-07-08 ENCOUNTER — Other Ambulatory Visit: Payer: Self-pay | Admitting: *Deleted

## 2015-07-08 ENCOUNTER — Telehealth (HOSPITAL_COMMUNITY): Payer: Self-pay | Admitting: Vascular Surgery

## 2015-07-08 ENCOUNTER — Telehealth: Payer: Self-pay | Admitting: *Deleted

## 2015-07-08 DIAGNOSIS — I5041 Acute combined systolic (congestive) and diastolic (congestive) heart failure: Secondary | ICD-10-CM

## 2015-07-08 DIAGNOSIS — I10 Essential (primary) hypertension: Secondary | ICD-10-CM

## 2015-07-08 DIAGNOSIS — I5023 Acute on chronic systolic (congestive) heart failure: Secondary | ICD-10-CM

## 2015-07-08 DIAGNOSIS — I5043 Acute on chronic combined systolic (congestive) and diastolic (congestive) heart failure: Secondary | ICD-10-CM

## 2015-07-08 NOTE — Telephone Encounter (Signed)
I lmptcb on pt's wife DPR to go over lab results as well as a pt being referred to CHF also to hold Plavix x 7 days before steroid injection and resume once surgeon says ok.

## 2015-07-08 NOTE — Telephone Encounter (Signed)
Left pt message for New  Pt appt

## 2015-07-08 NOTE — Telephone Encounter (Signed)
S/w DPR wife of lab results. wife aware to increase lasix to 80 mg twice daily, bmet 8/26, aware hold Plavix x 7days before steroid injection, a referral to CHF clinic as well, wife verbalized understanding to all instructions.

## 2015-07-09 ENCOUNTER — Telehealth: Payer: Self-pay | Admitting: Cardiology

## 2015-07-09 NOTE — Telephone Encounter (Signed)
OK to hold plavix for 5 days for epidural injection  Peter Martinique MD, [facility]

## 2015-07-09 NOTE — Telephone Encounter (Signed)
Spoke to patient Dr.Jordan advised ok to hold plavix 5 days prior to epidural injection.

## 2015-07-09 NOTE — Telephone Encounter (Signed)
Note faxed to [facility] at [place] at fax # [phone].

## 2015-07-09 NOTE — Telephone Encounter (Signed)
Would luike to speak to you about [name]

## 2015-07-09 NOTE — Telephone Encounter (Signed)
Received a call from [facility] with [place].Patient is scheduled to have a epidural injection 07/20/15.Patient will need to hold plavix 5 days prior to injection.Message sent to [place] for advice.

## 2015-07-10 ENCOUNTER — Telehealth: Payer: Self-pay | Admitting: *Deleted

## 2015-07-10 NOTE — Telephone Encounter (Signed)
Signed order faxed to [facility] for home oxygen/supplies.

## 2015-07-14 DIAGNOSIS — M2041 Other hammer toe(s) (acquired), right foot: Secondary | ICD-10-CM | POA: Diagnosis not present

## 2015-07-14 DIAGNOSIS — Z1389 Encounter for screening for other disorder: Secondary | ICD-10-CM | POA: Diagnosis not present

## 2015-07-14 DIAGNOSIS — L84 Corns and callosities: Secondary | ICD-10-CM | POA: Diagnosis not present

## 2015-07-14 DIAGNOSIS — Z6829 Body mass index (BMI) 29.0-29.9, adult: Secondary | ICD-10-CM | POA: Diagnosis not present

## 2015-07-14 DIAGNOSIS — E1149 Type 2 diabetes mellitus with other diabetic neurological complication: Secondary | ICD-10-CM | POA: Diagnosis not present

## 2015-07-14 DIAGNOSIS — Z9181 History of falling: Secondary | ICD-10-CM | POA: Diagnosis not present

## 2015-07-14 DIAGNOSIS — M5136 Other intervertebral disc degeneration, lumbar region: Secondary | ICD-10-CM | POA: Diagnosis not present

## 2015-07-14 DIAGNOSIS — G4733 Obstructive sleep apnea (adult) (pediatric): Secondary | ICD-10-CM | POA: Diagnosis not present

## 2015-07-14 DIAGNOSIS — F329 Major depressive disorder, single episode, unspecified: Secondary | ICD-10-CM | POA: Diagnosis not present

## 2015-07-16 ENCOUNTER — Telehealth (HOSPITAL_COMMUNITY): Payer: Self-pay | Admitting: Vascular Surgery

## 2015-07-16 NOTE — Telephone Encounter (Signed)
Left 3 d message to get pt schedule for new pt appt

## 2015-07-17 ENCOUNTER — Telehealth: Payer: Self-pay | Admitting: *Deleted

## 2015-07-17 ENCOUNTER — Other Ambulatory Visit (INDEPENDENT_AMBULATORY_CARE_PROVIDER_SITE_OTHER): Payer: Medicare Other

## 2015-07-17 DIAGNOSIS — I5041 Acute combined systolic (congestive) and diastolic (congestive) heart failure: Secondary | ICD-10-CM

## 2015-07-17 DIAGNOSIS — I10 Essential (primary) hypertension: Secondary | ICD-10-CM | POA: Diagnosis not present

## 2015-07-17 LAB — BASIC METABOLIC PANEL
BUN: 32 mg/dL — ABNORMAL HIGH (ref 6–23)
CO2: 32 meq/L (ref 19–32)
CREATININE: 1.27 mg/dL (ref 0.40–1.50)
Calcium: 9.2 mg/dL (ref 8.4–10.5)
Chloride: 100 mEq/L (ref 96–112)
GFR: 58.39 mL/min — AB (ref >60.00)
Glucose, Bld: 127 mg/dL — ABNORMAL HIGH (ref 70–99)
Potassium: 3.7 mEq/L (ref 3.5–5.1)
Sodium: 140 mEq/L (ref 135–145)

## 2015-07-17 NOTE — Telephone Encounter (Signed)
Lmptcb to go over lab results 

## 2015-07-17 NOTE — Telephone Encounter (Signed)
Certificate of medical necessity for oxygen filled out and faxed to [place].

## 2015-07-18 ENCOUNTER — Other Ambulatory Visit: Payer: Self-pay | Admitting: Cardiology

## 2015-07-20 ENCOUNTER — Ambulatory Visit
Admission: RE | Admit: 2015-07-20 | Discharge: 2015-07-20 | Disposition: A | Discharge status: Home / Self Care | Payer: Medicare Other | Source: Ambulatory Visit | Attending: Orthopedic Surgery | Admitting: Orthopedic Surgery

## 2015-07-20 DIAGNOSIS — M5136 Other intervertebral disc degeneration, lumbar region: Secondary | ICD-10-CM

## 2015-07-20 DIAGNOSIS — M5416 Radiculopathy, lumbar region: Secondary | ICD-10-CM | POA: Diagnosis not present

## 2015-07-20 MED ORDER — IOHEXOL 180 MG/ML  SOLN
1.0000 mL | Freq: Once | INTRAMUSCULAR | Status: DC | PRN
  Administered 2015-07-20: 1 mL via EPIDURAL

## 2015-07-20 MED ORDER — METHYLPREDNISOLONE ACETATE 40 MG/ML INJ SUSP (RADIOLOG
120.0000 mg | Freq: Once | INTRAMUSCULAR | Status: AC
  Administered 2015-07-20: 120 mg via EPIDURAL

## 2015-07-20 NOTE — Telephone Encounter (Signed)
Pt notified of lab results by phone with verbal understanding. Pt asked for me to fax to his PCP.

## 2015-07-20 NOTE — Discharge Instructions (Signed)

## 2015-07-22 ENCOUNTER — Encounter: Payer: Self-pay | Admitting: Cardiology

## 2015-07-22 ENCOUNTER — Telehealth: Payer: Self-pay | Admitting: Cardiology

## 2015-07-22 DIAGNOSIS — M5136 Other intervertebral disc degeneration, lumbar region: Secondary | ICD-10-CM | POA: Diagnosis not present

## 2015-07-24 ENCOUNTER — Ambulatory Visit (INDEPENDENT_AMBULATORY_CARE_PROVIDER_SITE_OTHER): Payer: Medicare Other | Admitting: Cardiology

## 2015-07-24 ENCOUNTER — Encounter: Payer: Self-pay | Admitting: Cardiology

## 2015-07-24 VITALS — BP 130/56 | HR 68 | Ht 68.0 in | Wt 190.9 lb

## 2015-07-24 DIAGNOSIS — E1159 Type 2 diabetes mellitus with other circulatory complications: Secondary | ICD-10-CM | POA: Diagnosis not present

## 2015-07-24 DIAGNOSIS — Z951 Presence of aortocoronary bypass graft: Secondary | ICD-10-CM | POA: Diagnosis not present

## 2015-07-24 DIAGNOSIS — I5022 Chronic systolic (congestive) heart failure: Secondary | ICD-10-CM

## 2015-07-24 DIAGNOSIS — I255 Ischemic cardiomyopathy: Secondary | ICD-10-CM

## 2015-07-24 DIAGNOSIS — I10 Essential (primary) hypertension: Secondary | ICD-10-CM

## 2015-07-24 MED ORDER — PRAVASTATIN SODIUM 40 MG PO TABS: 40.0000 mg | ORAL_TABLET | Freq: Every day | ORAL | Status: DC

## 2015-07-24 MED ORDER — BISOPROLOL FUMARATE 5 MG PO TABS: 2.5000 mg | ORAL_TABLET | Freq: Every day | ORAL | Status: DC

## 2015-07-24 MED ORDER — SPIRONOLACTONE 25 MG PO TABS: 12.5000 mg | ORAL_TABLET | Freq: Every day | ORAL | Status: DC

## 2015-07-24 MED ORDER — POTASSIUM CHLORIDE 20 MEQ PO PACK: 20.0000 meq | PACK | Freq: Every day | ORAL | Status: DC

## 2015-07-24 MED ORDER — LISINOPRIL 2.5 MG PO TABS: 2.5000 mg | ORAL_TABLET | Freq: Every day | ORAL | Status: DC

## 2015-07-24 MED ORDER — FUROSEMIDE 40 MG PO TABS: 80.0000 mg | ORAL_TABLET | Freq: Two times a day (BID) | ORAL | Status: DC

## 2015-07-24 NOTE — Progress Notes (Signed)
Cardiology Office Note   Date:  07/24/2015   ID:  Victor [name], DOB 28-Oct-1936, MRN [id number]  PCP:  Leonides Sake, MD  Cardiologist:  Dr. Carleene Overlie Taylor/Dr. Peter Martinique   Electrophysiologist:  Dr. Cristopher Peru    Chief Complaint  Patient presents with  . Follow-up    [facility]:  Now on home O2.  Had the first of 3 steroid spinal injections Monday.     History of Present Illness: Victor [name] is a 79 y.o. male is seen for follow up CAD and CHF. He has a hx of CAD, status post CABG in 1990 with known occlusion of both vein grafts, PCI to the SVG-OM3 in 2014 and attempted CTO PCI of the RCA 09/2014 (angioplasty only-no stenting- incomplete reperfusion), chronic combined systolic and diastolic CHF, ischemic cardiomyopathy with history of VT in 2012, status post ICD, diabetes, CKD, HTN, HL, PAD, carotid stenosis.   Admitted 02/2015 with acute CHF exacerbation. He had new EKG changes and underwent LHC with unsuccessful repeat attempt at PCI of CTO RCA. Medical therapy was continued.  Admitted 8/7-8/9 with acute on chronic combined systolic and diastolic CHF. Patient has a history of orthostatic hypotension. He was diuresed cautiously with IV Lasix. Follow-up echo demonstrated no significant change with an EF of 35-40%. Discharge weight 187 pounds. The patient is noted to have orthostatic blood pressure drop (120>> 95 with standing). Compression stockings were recommended at discharge. He was discharged home on O2.  He was seen by Richardson Dopp on 07/07/15 and was hypoxic with sats 83% on RA that corrected on oxygen. On follow up today he has been using oxygen regularly. He thinks it has helped a lot. He is sleeping better. Using compression hose. Still notes symptoms of low energy and leg fatigue that he feels may be related to back problems. He had recent steroid injection. Weight is down 2 lbs. No edema. Feels breathing is better. Has no chest pain except for one episode of heaviness  yesterday that resolved with sl Ntg.    Studies/Reports Reviewed Today:  Echo 06/29/15 Mild LVH, EF 35-40%, inferior HK, mild MR, mild LAE, PASP 42 mmHg Impressions: - Compared to the prior study, there has been no significantinterval change.  LHC 03/20/15 Left mainstem: Normal  LAD: Proximal 30-40%, D1 ostial 80%.  LCx: The LCx is occluded distally. The first and second OM branches are occluded.  RCA: Proximal severely calcified 90%, mid occluded with what appeared to be a micro channel through the lesion with filling distally as well as some left to right collaterals.  PCI Data: Unable to cross with a wire.  Final Conclusions:  1. Severe 2 vessel obstructive CAD with chronic occlusion of OMs and RCA 2. Known occlusion of SVGs from prior caths. 3. Normal right heart and LV filling pressures. 4. Unsuccessful attempt at PCI of the RCA due to inability to cross occlusion in the mid vessel after the RV marginal branch. Recommendations: Continue medical therapy. Will review with Dr. Irish Lack to see if there are any further options for CTO PCI. If stable patient can be DC this weekend.   Myoview 02/05/14 High risk stress nuclear study with a large, severe intensity, partially reversible inferior lateral defect consistent with prior infarct and mild peri-infarct ischemia; possible transient ischemic dilatation of LV cavity.  LV Ejection Fraction: 25%. LV Wall Motion: Akinesis of the inferior lateral wall.    Past Medical History  Diagnosis Date  . Occlusion and stenosis of carotid  artery without mention of cerebral infarction   . Raynaud's syndrome   . Paroxysmal ventricular tachycardia   . Peripheral vascular disease   . Cardiomyopathy, ischemic     a. s/p ICD placement  . Automatic implantable cardioverter-defibrillator in situ   . HLD (hyperlipidemia)   . GERD (gastroesophageal reflux disease)   . Chronic lower back pain   . Coronary artery disease     a. s/p CABG 1990. b.  multiple PCIs since that time including CTO angioplasty (without stenting) PCI of RCA 09/2014, with last cath 02/2015 done for worsening CHF with unsuccessful attempt at PCI of the RCA due to inability to cross occlusion - medical therapy advised at that time.  . Hypertension   . Type II diabetes mellitus   . Arthritis   . TIA (transient ischemic attack)     a. s/p LHC on 10/01/14   . Chronic combined systolic and diastolic CHF (congestive heart failure)   . Stroke     a. CT head 06/2015: Small old lacunar infarct LEFT basal ganglia.  . Orthostatic hypotension   . CKD (chronic kidney disease), stage III   . Anemia   . Thrombocytopenia     Past Surgical History  Procedure Laterality Date  . Anal fistulectomy  2000's  . Cataract extraction, bilateral Bilateral ~ 2009  . Cardiac defibrillator placement  02/1989-12/2011    "I've had a total of 6 or 7 defibrillators put in" (09/30/2014)  . Finger fracture surgery Left ~ 2000    "pointer"  . Coronary artery bypass graft  01/1989    "CABG X3"   . Fracture surgery    . Coronary angioplasty with stent placement  04/2013    "1"  . Cardiac catheterization  07/2014  . Coronary angioplasty  10/01/14    PTVA to CTO with restoration of TIMI 3  . Implantable cardioverter defibrillator (icd) generator change Left 10/05/2011    Procedure: ICD GENERATOR CHANGE;  Surgeon: Evans Lance, MD;  Location: [facility] CATH LAB;  Service: Cardiovascular;  Laterality: Left;  . Left and right heart catheterization with coronary/graft angiogram N/A 04/26/2013    Procedure: LEFT AND RIGHT HEART CATHETERIZATION WITH Beatrix Fetters;  Surgeon: Sherren Mocha, MD;  Location: [facility] CATH LAB;  Service: Cardiovascular;  Laterality: N/A;  . Percutaneous stent intervention  04/26/2013    Procedure: PERCUTANEOUS STENT INTERVENTION;  Surgeon: Sherren Mocha, MD;  Location: [facility] CATH LAB;  Service: Cardiovascular;;  . Left heart catheterization with coronary angiogram N/A 08/01/2014     Procedure: LEFT HEART CATHETERIZATION WITH CORONARY ANGIOGRAM;  Surgeon: Sinclair Grooms, MD;  Location[facility] CATH LAB;  Service: Cardiovascular;  Laterality: N/A;  . Right heart catheterization  10/01/2014    Procedure: RIGHT HEART CATH;  Surgeon: Peter M Martinique, MD;  Location: [facility] CATH LAB;  Service: Cardiovascular;;  . Cardiac catheterization  10/01/2014    Procedure: CORONARY BALLOON ANGIOPLASTY;  Surgeon: Peter M Martinique, MD;  Location: [facility] CATH LAB;  Service: Cardiovascular;;  . Left heart catheterization with coronary angiogram N/A 03/20/2015    Procedure: LEFT HEART CATHETERIZATION WITH CORONARY ANGIOGRAM;  Surgeon: Peter M Martinique, MD;  Location: [facility] CATH LAB;  Service: Cardiovascular;  Laterality: N/A;     Current Outpatient Prescriptions  Medication Sig Dispense Refill  . aspirin (ASPIR-81) 81 MG EC tablet Take 81 mg by mouth at bedtime.     . bisoprolol (ZEBETA) 5 MG tablet Take 0.5 tablets (2.5 mg total) by mouth daily. 45 tablet 3  .  clopidogrel (PLAVIX) 75 MG tablet TAKE 1 TABLET EVERY DAY WITH BREAKFAST 90 tablet 3  . dexamethasone (DECADRON) 4 MG tablet Take 4 mg by mouth daily as needed.  0  . furosemide (LASIX) 40 MG tablet Take 2 tablets (80 mg total) by mouth 2 (two) times daily. 180 tablet 3  . HYDROcodone-acetaminophen (NORCO) 10-325 MG per tablet Take 1 tablet by mouth every 8 (eight) hours as needed for moderate pain.   0  . insulin aspart protamine- aspart (NOVOLOG MIX 70/30) (70-30) 100 UNIT/ML injection Inject 10-30 Units into the skin 2 (two) times daily with a meal. Sliding scale    . lisinopril (PRINIVIL,ZESTRIL) 2.5 MG tablet Take 1 tablet (2.5 mg total) by mouth daily. 90 tablet 3  . LYRICA 150 MG capsule Take 150 mg by mouth daily.   1  . metFORMIN (GLUCOPHAGE) 1000 MG tablet Take 1,000 mg by mouth 2 (two) times daily with a meal.    . mupirocin ointment (BACTROBAN) 2 % Apply 1 application topically 2 (two) times daily.  3  . nitroGLYCERIN (NITROSTAT) 0.4 MG SL  tablet Place 0.4 mg under the tongue every 5 (five) minutes as needed for chest pain.    . ONE TOUCH ULTRA TEST test strip CHECK SUGAR AS DIRECTED 3 TIMES DAILY  5  . potassium chloride (KLOR-CON) 20 MEQ packet Take 20 mEq by mouth daily. 90 packet 3  . pravastatin (PRAVACHOL) 40 MG tablet Take 1 tablet (40 mg total) by mouth at bedtime. 90 tablet 3  . SANTYL ointment Apply 1 application topically every other day.  2  . vitamin B-12 (CYANOCOBALAMIN) 1000 MCG tablet Take 1,000 mcg by mouth daily.    Marland Kitchen spironolactone (ALDACTONE) 25 MG tablet Take 0.5 tablets (12.5 mg total) by mouth daily. 30 tablet 11   No current facility-administered medications for this visit.    Allergies:   Coreg; Lovastatin; and Ativan    Social History:  The patient  reports that he has quit smoking. His smoking use included Pipe. He has never used smokeless tobacco. He reports that he drinks alcohol. He reports that he does not use illicit drugs.   Family History:  The patient's family history includes Heart attack in his father; Heart failure (age of onset: 15) in his mother; Leukemia in his sister; Stomach cancer in his maternal grandfather.    ROS:   Please see the history of present illness.   Review of Systems  Constitution: Positive for malaise/fatigue. Negative for decreased appetite.  HENT: Negative for headaches.   Cardiovascular: Positive for chest pain and dyspnea on exertion. Negative for leg swelling.  Respiratory: Positive for shortness of breath. Negative for wheezing.   Hematologic/Lymphatic: Negative for bleeding problem. Does not bruise/bleed easily.  Musculoskeletal: Positive for back pain and joint pain.  Gastrointestinal: Negative for bloating.  Neurological: Negative for dizziness and loss of balance.  Psychiatric/Behavioral: The patient is nervous/anxious.   All other systems reviewed and are negative.     PHYSICAL EXAM: VS:  BP 130/56 mmHg  Pulse 68  Ht 5\' 8"  (1.727 m)  Wt 86.592  kg (190 lb 14.4 oz)  BMI 29.03 kg/m2    Wt Readings from Last 3 Encounters:  07/24/15 86.592 kg (190 lb 14.4 oz)  07/07/15 87.544 kg (193 lb)  06/30/15 84.913 kg (187 lb 3.2 oz)     GEN: Well nourished, well developed, in no acute distress HEENT: normal Neck: no JVD,   no masses Cardiac:  Normal S1/S2, RRR;  no murmur ,  no rubs or gallops, No edema Respiratory:  Decreased breath sounds bilaterally, no wheezing, rhonchi or rales. GI: soft, nontender, nondistended, + BS MS: no deformity or atrophy Skin: warm and dry  Neuro:  CNs II-XII intact, Strength and sensation are intact Psych: Normal affect   EKG:  EKG is not ordered today.    Recent Labs: 06/28/2015: ALT 18; Magnesium 1.9; TSH 1.001 06/30/2015: B Natriuretic Peptide 388.2*; Hemoglobin 10.3*; Platelets 139* 07/07/2015: Pro B Natriuretic peptide (BNP) 473.0* 07/17/2015: BUN 32*; Creatinine, Ser 1.27; Potassium 3.7; Sodium 140    Lipid Panel    Component Value Date/Time   CHOL 127 06/29/2015 0508   TRIG 111 06/29/2015 0508   HDL 36* 06/29/2015 0508   CHOLHDL 3.5 06/29/2015 0508   VLDL 22 06/29/2015 0508   LDLCALC 69 06/29/2015 0508      ASSESSMENT AND PLAN:  1. Chronic combined systolic and diastolic CHF:  Overall, appears euvolemic today. He does have issues with orthostatic hypotension. His weight is down. Recommend continued use of oxygen and compression hose. Will try and initiate aldactone 12.5 mg bid. Will follow BMET closely. Need to watch closely for orthostatic hypotension that has limited medication titration previously. Given 2 admissions for CHF exacerbation since May I have recommended evaluation in the Advanced heart failure clinic for additional thoughts. I will follow up in 2 months.  2. Cardiomyopathy, ischemic:  Last EF 35-40% by echocardiogram. Continue beta blocker, ACE inhibitor.   3. Coronary artery disease involving native coronary artery of native heart without angina pectoris:   He had a recent  heart catheterization demonstrating severe 2 vessel CAD with chronic occlusion of the obtuse marginal and RCA with unsuccessful attempt at CTO  PCI of the RCA. Continue aspirin, beta blocker, Plavix, statin. He is not a candidate for further revascularization.  4. Essential hypertension:  Controlled.  5. Orthostatic hypotension:  Continue  compression stockings to help with his orthostatic hypotension.   6. Hyperlipidemia:    continue statin.  7. ICD- BS Nov 2012 for VT:  Follow-up with EP as planned.   8. Type 2 diabetes mellitus with other circulatory complications:  Follow-up with primary care.  9. CKD (chronic kidney disease), class 3.    10. Back pain, s/p epidural injection.     Medication Changes: Current medicines are reviewed at length with the patient today.  Concerns regarding medicines are as outlined above.  The following changes have been made:   Discontinued Medications   AMOXICILLIN-CLAVULANATE (AUGMENTIN) 500-125 MG PER TABLET    Take 1 tablet by mouth 2 (two) times daily.   Modified Medications   Modified Medication Previous Medication   BISOPROLOL (ZEBETA) 5 MG TABLET bisoprolol (ZEBETA) 5 MG tablet      Take 0.5 tablets (2.5 mg total) by mouth daily.    Take 0.5 tablets (2.5 mg total) by mouth daily.   FUROSEMIDE (LASIX) 40 MG TABLET furosemide (LASIX) 40 MG tablet      Take 2 tablets (80 mg total) by mouth 2 (two) times daily.    Take 1.5 tablets (60 mg total) by mouth 2 (two) times daily.   LISINOPRIL (PRINIVIL,ZESTRIL) 2.5 MG TABLET lisinopril (PRINIVIL,ZESTRIL) 2.5 MG tablet      Take 1 tablet (2.5 mg total) by mouth daily.    TAKE 1 TABLET BY MOUTH DAILY   POTASSIUM CHLORIDE (KLOR-CON) 20 MEQ PACKET potassium chloride (KLOR-CON) 20 MEQ packet      Take 20 mEq by mouth daily.  Take 20 mEq by mouth daily.   PRAVASTATIN (PRAVACHOL) 40 MG TABLET pravastatin (PRAVACHOL) 40 MG tablet      Take 1 tablet (40 mg total) by mouth at bedtime.    Take 40 mg by mouth  at bedtime.    New Prescriptions   SPIRONOLACTONE (ALDACTONE) 25 MG TABLET    Take 0.5 tablets (12.5 mg total) by mouth daily.    Labs/ tests ordered today include:   No orders of the defined types were placed in this encounter.     Disposition:    FU with with Dr. Martinique in 2 months. Will arrange visit in CHF clinic. Close follow up of BMET on aldactone.    Signed, Peter Martinique MD, [facility]    07/24/2015 1:26 PM

## 2015-07-24 NOTE — Patient Instructions (Signed)
Stop potassium   Start aldactone 12.5 mg daily  We will arrange follow up lab work  Make follow up appointment with our heart failure team.

## 2015-07-28 ENCOUNTER — Encounter: Payer: Self-pay | Admitting: Internal Medicine

## 2015-08-05 ENCOUNTER — Encounter: Payer: Self-pay | Admitting: Cardiology

## 2015-08-06 ENCOUNTER — Encounter: Payer: Self-pay | Admitting: Pharmacist Clinician (PhC)/ Clinical Pharmacy Specialist

## 2015-08-06 ENCOUNTER — Ambulatory Visit (INDEPENDENT_AMBULATORY_CARE_PROVIDER_SITE_OTHER): Payer: Medicare Other | Admitting: Pharmacist Clinician (PhC)/ Clinical Pharmacy Specialist

## 2015-08-06 ENCOUNTER — Other Ambulatory Visit: Payer: Self-pay | Admitting: Cardiology

## 2015-08-06 DIAGNOSIS — I5042 Chronic combined systolic (congestive) and diastolic (congestive) heart failure: Secondary | ICD-10-CM | POA: Diagnosis not present

## 2015-08-06 LAB — BASIC METABOLIC PANEL
BUN: 38 mg/dL — AB (ref 7–25)
CALCIUM: 9.4 mg/dL (ref 8.6–10.3)
CO2: 31 mmol/L (ref 20–31)
Chloride: 97 mmol/L — ABNORMAL LOW (ref 98–110)
Creat: 1.33 mg/dL — ABNORMAL HIGH (ref 0.70–1.18)
Glucose, Bld: 196 mg/dL — ABNORMAL HIGH (ref 65–99)
POTASSIUM: 5.1 mmol/L (ref 3.5–5.3)
Sodium: 138 mmol/L (ref 135–146)

## 2015-08-06 NOTE — Progress Notes (Signed)
Reveiewed need for repeated labs (today and again in about 10-14 days) now that he has started on spironolactone.  Answered patient questions and sent to lab for blood draw.

## 2015-08-11 ENCOUNTER — Other Ambulatory Visit: Payer: Self-pay

## 2015-08-11 DIAGNOSIS — I10 Essential (primary) hypertension: Secondary | ICD-10-CM

## 2015-08-11 DIAGNOSIS — I5041 Acute combined systolic (congestive) and diastolic (congestive) heart failure: Secondary | ICD-10-CM

## 2015-08-12 ENCOUNTER — Ambulatory Visit (HOSPITAL_COMMUNITY)
Admission: RE | Admit: 2015-08-12 | Discharge: 2015-08-12 | Disposition: A | Discharge status: Home / Self Care | Payer: Medicare Other | Source: Ambulatory Visit | Attending: Internal Medicine | Admitting: Internal Medicine

## 2015-08-12 ENCOUNTER — Encounter (HOSPITAL_COMMUNITY): Payer: Self-pay

## 2015-08-12 VITALS — BP 116/60 | HR 68 | Wt 188.5 lb

## 2015-08-12 DIAGNOSIS — N183 Chronic kidney disease, stage 3 (moderate): Secondary | ICD-10-CM | POA: Insufficient documentation

## 2015-08-12 DIAGNOSIS — I5042 Chronic combined systolic (congestive) and diastolic (congestive) heart failure: Secondary | ICD-10-CM | POA: Diagnosis not present

## 2015-08-12 DIAGNOSIS — Z8673 Personal history of transient ischemic attack (TIA), and cerebral infarction without residual deficits: Secondary | ICD-10-CM | POA: Insufficient documentation

## 2015-08-12 DIAGNOSIS — R21 Rash and other nonspecific skin eruption: Secondary | ICD-10-CM | POA: Insufficient documentation

## 2015-08-12 DIAGNOSIS — Z87891 Personal history of nicotine dependence: Secondary | ICD-10-CM | POA: Diagnosis not present

## 2015-08-12 DIAGNOSIS — J449 Chronic obstructive pulmonary disease, unspecified: Secondary | ICD-10-CM | POA: Diagnosis not present

## 2015-08-12 DIAGNOSIS — I255 Ischemic cardiomyopathy: Secondary | ICD-10-CM | POA: Diagnosis not present

## 2015-08-12 DIAGNOSIS — I739 Peripheral vascular disease, unspecified: Secondary | ICD-10-CM | POA: Diagnosis not present

## 2015-08-12 DIAGNOSIS — Z9981 Dependence on supplemental oxygen: Secondary | ICD-10-CM | POA: Diagnosis not present

## 2015-08-12 DIAGNOSIS — Z794 Long term (current) use of insulin: Secondary | ICD-10-CM | POA: Insufficient documentation

## 2015-08-12 DIAGNOSIS — Z95810 Presence of automatic (implantable) cardiac defibrillator: Secondary | ICD-10-CM | POA: Diagnosis not present

## 2015-08-12 DIAGNOSIS — Z79899 Other long term (current) drug therapy: Secondary | ICD-10-CM | POA: Diagnosis not present

## 2015-08-12 DIAGNOSIS — E785 Hyperlipidemia, unspecified: Secondary | ICD-10-CM | POA: Insufficient documentation

## 2015-08-12 DIAGNOSIS — I129 Hypertensive chronic kidney disease with stage 1 through stage 4 chronic kidney disease, or unspecified chronic kidney disease: Secondary | ICD-10-CM | POA: Insufficient documentation

## 2015-08-12 DIAGNOSIS — Z8249 Family history of ischemic heart disease and other diseases of the circulatory system: Secondary | ICD-10-CM | POA: Insufficient documentation

## 2015-08-12 DIAGNOSIS — E1122 Type 2 diabetes mellitus with diabetic chronic kidney disease: Secondary | ICD-10-CM | POA: Diagnosis not present

## 2015-08-12 DIAGNOSIS — Z7982 Long term (current) use of aspirin: Secondary | ICD-10-CM | POA: Insufficient documentation

## 2015-08-12 DIAGNOSIS — I5022 Chronic systolic (congestive) heart failure: Secondary | ICD-10-CM

## 2015-08-12 DIAGNOSIS — I2510 Atherosclerotic heart disease of native coronary artery without angina pectoris: Secondary | ICD-10-CM | POA: Insufficient documentation

## 2015-08-12 DIAGNOSIS — Z7902 Long term (current) use of antithrombotics/antiplatelets: Secondary | ICD-10-CM | POA: Insufficient documentation

## 2015-08-12 DIAGNOSIS — J9610 Chronic respiratory failure, unspecified whether with hypoxia or hypercapnia: Secondary | ICD-10-CM | POA: Diagnosis not present

## 2015-08-12 DIAGNOSIS — K219 Gastro-esophageal reflux disease without esophagitis: Secondary | ICD-10-CM | POA: Insufficient documentation

## 2015-08-12 DIAGNOSIS — I951 Orthostatic hypotension: Secondary | ICD-10-CM | POA: Diagnosis not present

## 2015-08-12 DIAGNOSIS — Z951 Presence of aortocoronary bypass graft: Secondary | ICD-10-CM | POA: Diagnosis not present

## 2015-08-12 NOTE — Patient Instructions (Signed)
FOLLOW UP: 6 months

## 2015-08-12 NOTE — Progress Notes (Signed)
Patient ID: Victor [name], male   DOB: Jun 27, 1936, 79 y.o.   MRN: [id number]    Advanced HF Clinic Note   Date:  08/12/2015   ID:  Victor [name], DOB August 11, 1936, MRN [id number]  PCP:  Leonides Sake, MD  Cardiologist:  Dr. Carleene Overlie Taylor/Dr. Peter Martinique   Electrophysiologist:  Dr. Cristopher Peru    History of Present Illness: Victor [name] is a 79 y.o. male is seen for follow up CAD and CHF. He has a hx of CAD, status post CABG in 1990 with known occlusion of both vein grafts, PCI to the SVG-OM3 in 2014 and attempted CTO PCI of the RCA 09/2014 (angioplasty only-no stenting- incomplete reperfusion), chronic combined systolic and diastolic CHF (EF 90-24%), ischemic cardiomyopathy with history of VT in 2012, status post ICD, diabetes, CKD, HTN, HL, PAD, carotid stenosis. He is referred to the HF Clinic by Dr. Martinique for help with HF management.    Admitted 02/2015 with acute CHF exacerbation. He had new EKG changes and underwent LHC with unsuccessful repeat attempt at PCI of CTO RCA. Medical therapy was continued.  Admitted 8/7-8/9 with acute on chronic combined systolic and diastolic CHF. Patient has a history of orthostatic hypotension. He was diuresed cautiously with IV Lasix. Follow-up echo demonstrated no significant change with an EF of 35-40% with mild MR. RV normal. Discharge weight 187 pounds. The patient is noted to have orthostatic blood pressure drop (120>> 95 with standing). Compression stockings were recommended at discharge. He was discharged home on O2.  He was seen by Richardson Dopp on 07/07/15 and was hypoxic with sats 83% on RA and started on home O2.   Feels much, much better after starting home O2. Sleeping better. Breathing better. Remains on bisoprolol 2.5 daily,and lisinopril 2.5 daily. Spiro 12.5 started at last visit. Tolerating well. K 5.1 Doses limited by orthostatic hypotension. Wearing compression. Gets dizzy if stands up too fast but overall improved. No edema.  Weight stable  184-185. Major complaint is back pain. Still working in barn. No significant CP.    Studies/Reports Reviewed Today:  Echo 06/29/15 Mild LVH, EF 35-40%, inferior HK, mild MR, mild LAE, PASP 42 mmHg Impressions: - Compared to the prior study, there has been no significantinterval change.  LHC 03/20/15 Left mainstem: Normal  LAD: Proximal 30-40%, D1 ostial 80%.  LCx: The LCx is occluded distally. The first and second OM branches are occluded.  RCA: Proximal severely calcified 90%, mid occluded with what appeared to be a micro channel through the lesion with filling distally as well as some left to right collaterals.  PCI Data: Unable to cross with a wire.  Final Conclusions:  1. Severe 2 vessel obstructive CAD with chronic occlusion of OMs and RCA 2. Known occlusion of SVGs from prior caths. 3. Normal right heart and LV filling pressures. 4. Unsuccessful attempt at PCI of the RCA due to inability to cross occlusion in the mid vessel after the RV marginal branch. Recommendations: Continue medical therapy. Will review with Dr. Irish Lack to see if there are any further options for CTO PCI. If stable patient can be DC this weekend.   Myoview 02/05/14 High risk stress nuclear study with a large, severe intensity, partially reversible inferior lateral defect consistent with prior infarct and mild peri-infarct ischemia; possible transient ischemic dilatation of LV cavity.  LV Ejection Fraction: 25%. LV Wall Motion: Akinesis of the inferior lateral wall.    Past Medical History  Diagnosis Date  . Occlusion and  stenosis of carotid artery without mention of cerebral infarction   . Raynaud's syndrome   . Paroxysmal ventricular tachycardia   . Peripheral vascular disease   . Cardiomyopathy, ischemic     a. s/p ICD placement  . Automatic implantable cardioverter-defibrillator in situ   . HLD (hyperlipidemia)   . GERD (gastroesophageal reflux disease)   . Chronic lower back pain   . Coronary  artery disease     a. s/p CABG 1990. b. multiple PCIs since that time including CTO angioplasty (without stenting) PCI of RCA 09/2014, with last cath 02/2015 done for worsening CHF with unsuccessful attempt at PCI of the RCA due to inability to cross occlusion - medical therapy advised at that time.  . Hypertension   . Type II diabetes mellitus   . Arthritis   . TIA (transient ischemic attack)     a. s/p LHC on 10/01/14   . Chronic combined systolic and diastolic CHF (congestive heart failure)   . Stroke     a. CT head 06/2015: Small old lacunar infarct LEFT basal ganglia.  . Orthostatic hypotension   . CKD (chronic kidney disease), stage III   . Anemia   . Thrombocytopenia     Past Surgical History  Procedure Laterality Date  . Anal fistulectomy  2000's  . Cataract extraction, bilateral Bilateral ~ 2009  . Cardiac defibrillator placement  02/1989-12/2011    "I've had a total of 6 or 7 defibrillators put in" (09/30/2014)  . Finger fracture surgery Left ~ 2000    "pointer"  . Coronary artery bypass graft  01/1989    "CABG X3"   . Fracture surgery    . Coronary angioplasty with stent placement  04/2013    "1"  . Cardiac catheterization  07/2014  . Coronary angioplasty  10/01/14    PTVA to CTO with restoration of TIMI 3  . Implantable cardioverter defibrillator (icd) generator change Left 10/05/2011    Procedure: ICD GENERATOR CHANGE;  Surgeon: Evans Lance, MD;  Location: [facility] CATH LAB;  Service: Cardiovascular;  Laterality: Left;  . Left and right heart catheterization with coronary/graft angiogram N/A 04/26/2013    Procedure: LEFT AND RIGHT HEART CATHETERIZATION WITH Beatrix Fetters;  Surgeon: Sherren Mocha, MD;  Location: [facility] CATH LAB;  Service: Cardiovascular;  Laterality: N/A;  . Percutaneous stent intervention  04/26/2013    Procedure: PERCUTANEOUS STENT INTERVENTION;  Surgeon: Sherren Mocha, MD;  Location: [facility] CATH LAB;  Service: Cardiovascular;;  . Left heart catheterization  with coronary angiogram N/A 08/01/2014    Procedure: LEFT HEART CATHETERIZATION WITH CORONARY ANGIOGRAM;  Surgeon: Sinclair Grooms, MD;  Location: [facility] CATH LAB;  Service: Cardiovascular;  Laterality: N/A;  . Right heart catheterization  10/01/2014    Procedure: RIGHT HEART CATH;  Surgeon: Peter M Martinique, MD;  Location: [facility] CATH LAB;  Service: Cardiovascular;;  . Cardiac catheterization  10/01/2014    Procedure: CORONARY BALLOON ANGIOPLASTY;  Surgeon: Peter M Martinique, MD;  Location: [facility] CATH LAB;  Service: Cardiovascular;;  . Left heart catheterization with coronary angiogram N/A 03/20/2015    Procedure: LEFT HEART CATHETERIZATION WITH CORONARY ANGIOGRAM;  Surgeon: Peter M Martinique, MD;  Location: [facility] CATH LAB;  Service: Cardiovascular;  Laterality: N/A;     Current Outpatient Prescriptions  Medication Sig Dispense Refill  . aspirin (ASPIR-81) 81 MG EC tablet Take 81 mg by mouth at bedtime.     . bisoprolol (ZEBETA) 5 MG tablet Take 0.5 tablets (2.5 mg total) by mouth daily. [place]  tablet 3  . clopidogrel (PLAVIX) 75 MG tablet TAKE 1 TABLET EVERY DAY WITH BREAKFAST 90 tablet 3  . dexamethasone (DECADRON) 4 MG tablet Take 4 mg by mouth daily as needed (back pain).   0  . furosemide (LASIX) 40 MG tablet Take 2 tablets (80 mg total) by mouth 2 (two) times daily. 180 tablet 3  . HYDROcodone-acetaminophen (NORCO) 10-325 MG per tablet Take 1 tablet by mouth every 8 (eight) hours as needed for moderate pain.   0  . insulin aspart protamine- aspart (NOVOLOG MIX 70/30) (70-30) 100 UNIT/ML injection Inject 10-30 Units into the skin 2 (two) times daily with a meal. Sliding scale    . lisinopril (PRINIVIL,ZESTRIL) 2.5 MG tablet Take 1 tablet (2.5 mg total) by mouth daily. 90 tablet 3  . LYRICA 150 MG capsule Take 150 mg by mouth daily.   1  . metFORMIN (GLUCOPHAGE) 1000 MG tablet Take 1,000 mg by mouth 2 (two) times daily with a meal.    . mupirocin ointment (BACTROBAN) 2 % Apply 1 application topically 2 (two) times  daily.  3  . nitroGLYCERIN (NITROSTAT) 0.4 MG SL tablet Place 0.4 mg under the tongue every 5 (five) minutes as needed for chest pain.    . ONE TOUCH ULTRA TEST test strip CHECK SUGAR AS DIRECTED 3 TIMES DAILY  5  . pravastatin (PRAVACHOL) 40 MG tablet Take 1 tablet (40 mg total) by mouth at bedtime. 90 tablet 3  . SANTYL ointment Apply 1 application topically every other day.  2  . spironolactone (ALDACTONE) 25 MG tablet Take 0.5 tablets (12.5 mg total) by mouth daily. 30 tablet 11  . vitamin B-12 (CYANOCOBALAMIN) 1000 MCG tablet Take 1,000 mcg by mouth daily.     No current facility-administered medications for this encounter.    Allergies:   Coreg; Lovastatin; and Ativan    Social History:  The patient  reports that he has quit smoking. His smoking use included Pipe. He has never used smokeless tobacco. He reports that he drinks alcohol. He reports that he does not use illicit drugs.   Family History:  The patient's family history includes Heart attack in his father; Heart failure (age of onset: 7) in his mother; Leukemia in his sister; Stomach cancer in his maternal grandfather.    ROS:   Please see the history of present illness.   Review of Systems  Constitution: Positive for malaise/fatigue. Negative for decreased appetite.  HENT: Negative for headaches.   Cardiovascular: Positive for chest pain and dyspnea on exertion. Negative for leg swelling.  Respiratory: Positive for shortness of breath. Negative for wheezing.   Hematologic/Lymphatic: Negative for bleeding problem. Does not bruise/bleed easily.  Musculoskeletal: Positive for back pain and joint pain.  Gastrointestinal: Negative for bloating.  Neurological: Negative for dizziness and loss of balance.  Psychiatric/Behavioral: The patient is nervous/anxious.   All other systems reviewed and are negative.     PHYSICAL EXAM: VS:  BP 116/60 mmHg  Pulse 68  Wt 188 lb 8 oz (85.503 kg)  SpO2 99%    Wt Readings from Last  3 Encounters:  08/12/15 188 lb 8 oz (85.503 kg)  07/24/15 190 lb 14.4 oz (86.592 kg)  07/07/15 193 lb (87.544 kg)    Orthostatics 116/54 sitting 128/54 standing   GEN: Well nourished, well developed, in no acute distress HEENT: normal Neck: no JVD,   no masses Cardiac:  Normal S1/S2, RRR; no murmur ,  no rubs or gallops, No edema Respiratory:  Decreased breath sounds bilaterally, no wheezing, rhonchi or rales. GI: obese soft, nontender, nondistended, + BS MS: no deformity or atrophy Skin: warm and dry diffuse papular rash on arms and trunk Neuro:  CNs II-XII intact, Strength and sensation are intact Psych: Normal affect    Recent Labs: 06/28/2015: ALT 18; Magnesium 1.9; TSH 1.001 06/30/2015: B Natriuretic Peptide 388.2*; Hemoglobin 10.3*; Platelets 139* 07/07/2015: Pro B Natriuretic peptide (BNP) 473.0* 08/06/2015: BUN 38*; Creat 1.33*; Potassium 5.1; Sodium 138    Lipid Panel    Component Value Date/Time   CHOL 127 06/29/2015 0508   TRIG 111 06/29/2015 0508   HDL 36* 06/29/2015 0508   CHOLHDL 3.5 06/29/2015 0508   VLDL 22 06/29/2015 0508   LDLCALC 69 06/29/2015 0508      ASSESSMENT AND PLAN:  1. Chronic combined systolic and diastolic CHF:  EF 10-17% by echo. 25% by Myoview. He is much improved with recent changes by Dr. Martinique. Now NYHA II-III. He is on low doses of ACE-I and b-blocker and spiro. Med titration limited by orthostasis. BP fine today. We discussed increasing bisoprolol to 2.5 bid but he says he has not tolerated titration in past. Potassium 5.1 so cannot titrate ACE or spiro. Will check labs today.  Continue current regimen.  - Volume status continue current regimen. Reinforced need for daily weights and reviewed use of sliding scale diuretics. - repeat BMET for next week already arranged.  - Overall very little to add to what Dr. Martinique has done. Will check back with him in 6 months to see how he is doing.   2. Cardiomyopathy, ischemic:  Last EF 35-40% by  echocardiogram. EF 25% by Myoview. Continue beta blocker, ACE inhibitor and spiro.   3. Coronary artery disease involving native coronary artery of native heart without angina pectoris:   He had a recent heart catheterization demonstrating severe 2 vessel CAD with chronic occlusion of the obtuse marginal and RCA with unsuccessful attempt at CTO  PCI of the RCA. Continue aspirin, beta blocker, Plavix, statin. He is not a candidate for further revascularization.  4. Essential hypertension:  Controlled.  5. Orthostatic hypotension:  Continue  compression stockings to help with his orthostatic hypotension. Much improved. Can add midodrine as needed.   6. Hyperlipidemia:    continue statin.  7. ICD- BS Nov 2012 for VT:  Follow-up with EP as planned.   8. Type 2 diabetes mellitus with other circulatory complications:  Follow-up with primary care.  9. CKD (chronic kidney disease), class 3.    10. Rash - refer to dermatology  11. COPD with chronic resp failure - continue home O2  Glori Bickers MD   08/12/2015 12:20 PM

## 2015-08-12 NOTE — Progress Notes (Signed)
Advanced Heart Failure Medication Review by a Pharmacist  Does the patient  feel that his/her medications are working for him/her?  yes  Has the patient been experiencing any side effects to the medications prescribed?  yes  Does the patient measure his/her own blood pressure or blood glucose at home?  yes   Does the patient have any problems obtaining medications due to transportation or finances?   no  Understanding of regimen: good Understanding of indications: fair Potential of compliance: good    Pharmacist comments:  Victor [name] is a 79 yo M presenting with his wife and a current medication list. His wife manages his medications so she seems to have a better understanding of his regimen than he does. He was concerned about bruising and scabbing on his arms and legs which could be caused by his Plavix and aspirin use. He states that he has not noticed any bleeding with coughing, stooling or urinating. He did ask me if there were any medications that could be causing xerostomia and we discussed that Lyrica could be the culprit with up to a 15% risk of this side effect. We discussed swishing with water and using hard candies to keep his mouth moist. He also asked about the utility of continuing metformin while on insulin and we had a good discussion of the continued benefits of concomitant therapies. He did not have any other medication-related questions or concerns for me at this time.   Ruta Hinds. Velva Harman, PharmD, BCPS, CPP Clinical Pharmacist Pager: [phone] Phone: [phone] 08/12/2015 12:27 PM

## 2015-08-15 ENCOUNTER — Other Ambulatory Visit: Payer: Self-pay | Admitting: Cardiology

## 2015-08-31 DIAGNOSIS — M47812 Spondylosis without myelopathy or radiculopathy, cervical region: Secondary | ICD-10-CM | POA: Diagnosis not present

## 2015-09-01 ENCOUNTER — Other Ambulatory Visit: Payer: Self-pay | Admitting: Orthopedic Surgery

## 2015-09-01 DIAGNOSIS — I509 Heart failure, unspecified: Secondary | ICD-10-CM | POA: Diagnosis not present

## 2015-09-01 DIAGNOSIS — M5136 Other intervertebral disc degeneration, lumbar region: Secondary | ICD-10-CM

## 2015-09-01 DIAGNOSIS — I10 Essential (primary) hypertension: Secondary | ICD-10-CM | POA: Diagnosis not present

## 2015-09-01 DIAGNOSIS — I5041 Acute combined systolic (congestive) and diastolic (congestive) heart failure: Secondary | ICD-10-CM | POA: Diagnosis not present

## 2015-09-02 LAB — BASIC METABOLIC PANEL
BUN: 30 mg/dL — ABNORMAL HIGH (ref 7–25)
CHLORIDE: 96 mmol/L — AB (ref 98–110)
CO2: 31 mmol/L (ref 20–31)
CREATININE: 1.07 mg/dL (ref 0.70–1.18)
Calcium: 9.2 mg/dL (ref 8.6–10.3)
Glucose, Bld: 195 mg/dL — ABNORMAL HIGH (ref 65–99)
Potassium: 3.9 mmol/L (ref 3.5–5.3)
SODIUM: 138 mmol/L (ref 135–146)

## 2015-09-09 ENCOUNTER — Ambulatory Visit
Admission: RE | Admit: 2015-09-09 | Discharge: 2015-09-09 | Disposition: A | Discharge status: Home / Self Care | Payer: Medicare Other | Source: Ambulatory Visit | Attending: Orthopedic Surgery | Admitting: Orthopedic Surgery

## 2015-09-09 DIAGNOSIS — M47817 Spondylosis without myelopathy or radiculopathy, lumbosacral region: Secondary | ICD-10-CM | POA: Diagnosis not present

## 2015-09-09 DIAGNOSIS — M5136 Other intervertebral disc degeneration, lumbar region: Secondary | ICD-10-CM

## 2015-09-09 MED ORDER — METHYLPREDNISOLONE ACETATE 40 MG/ML INJ SUSP (RADIOLOG
120.0000 mg | Freq: Once | INTRAMUSCULAR | Status: AC
  Administered 2015-09-09: 120 mg via EPIDURAL

## 2015-09-09 MED ORDER — IOHEXOL 180 MG/ML  SOLN
1.0000 mL | Freq: Once | INTRAMUSCULAR | Status: DC | PRN
  Administered 2015-09-09: 1 mL via EPIDURAL

## 2015-09-09 NOTE — Discharge Instructions (Signed)

## 2015-09-12 ENCOUNTER — Other Ambulatory Visit: Payer: Self-pay | Admitting: Orthopedic Surgery

## 2015-09-12 DIAGNOSIS — M5136 Other intervertebral disc degeneration, lumbar region: Secondary | ICD-10-CM

## 2015-09-12 DIAGNOSIS — M530 Cervicocranial syndrome: Secondary | ICD-10-CM

## 2015-09-12 DIAGNOSIS — R42 Dizziness and giddiness: Secondary | ICD-10-CM

## 2015-09-14 DIAGNOSIS — R233 Spontaneous ecchymoses: Secondary | ICD-10-CM | POA: Diagnosis not present

## 2015-09-14 DIAGNOSIS — L570 Actinic keratosis: Secondary | ICD-10-CM | POA: Diagnosis not present

## 2015-09-24 ENCOUNTER — Ambulatory Visit (INDEPENDENT_AMBULATORY_CARE_PROVIDER_SITE_OTHER): Payer: Medicare Other | Admitting: *Deleted

## 2015-09-24 DIAGNOSIS — I255 Ischemic cardiomyopathy: Secondary | ICD-10-CM

## 2015-09-25 NOTE — Progress Notes (Signed)
Remote ICD transmission.   

## 2015-10-02 ENCOUNTER — Telehealth: Payer: Self-pay | Admitting: Cardiology

## 2015-10-02 ENCOUNTER — Other Ambulatory Visit: Payer: Self-pay

## 2015-10-02 MED ORDER — SPIRONOLACTONE 25 MG PO TABS: 12.5000 mg | ORAL_TABLET | Freq: Every day | ORAL | Status: DC

## 2015-10-02 NOTE — Telephone Encounter (Signed)
Called pharmacy and they said pt's insurance is now only covering 97 day. i resent script for 90 day to [facility] [place]. Tried calling pt but couldn't get through

## 2015-10-02 NOTE — Telephone Encounter (Signed)
STAT* If patient is at the pharmacy, call can be transferred to refill team.   2nd request  1. Which medications need to be refilled? (please list name of each medication and dose if known)Spironolactone-please call today   2. Which pharmacy/location (including street and city if local pharmacy) is medication to be sent to?[facility] [phone] (phon  and fax [phone]  3. Do they need a 30 day or 90 day supply? 90 and refill-this is what his insurance requires

## 2015-10-02 NOTE — Telephone Encounter (Signed)
°*  STAT* If patient is at the pharmacy, call can be transferred to refill team.   1. Which medications need to be refilled? (please list name of each medication and dose if known)Spironolactone-please call today  2. Which pharmacy/location (including street and city if local pharmacy) is medication to be sent to?[facility]  3. Do they need a 30 day or 90 day supply? 90 and refill-this is what his insurance requires

## 2015-10-12 DIAGNOSIS — Z23 Encounter for immunization: Secondary | ICD-10-CM | POA: Diagnosis not present

## 2015-10-14 ENCOUNTER — Encounter: Payer: Self-pay | Admitting: Cardiology

## 2015-10-14 ENCOUNTER — Ambulatory Visit (INDEPENDENT_AMBULATORY_CARE_PROVIDER_SITE_OTHER): Payer: Medicare Other | Admitting: Cardiology

## 2015-10-14 VITALS — BP 114/76 | HR 64 | Ht 68.0 in | Wt 193.0 lb

## 2015-10-14 DIAGNOSIS — I255 Ischemic cardiomyopathy: Secondary | ICD-10-CM | POA: Diagnosis not present

## 2015-10-14 DIAGNOSIS — I472 Ventricular tachycardia: Secondary | ICD-10-CM

## 2015-10-14 DIAGNOSIS — Z95810 Presence of automatic (implantable) cardiac defibrillator: Secondary | ICD-10-CM | POA: Diagnosis not present

## 2015-10-14 DIAGNOSIS — I5022 Chronic systolic (congestive) heart failure: Secondary | ICD-10-CM | POA: Diagnosis not present

## 2015-10-14 DIAGNOSIS — I4729 Other ventricular tachycardia: Secondary | ICD-10-CM

## 2015-10-14 LAB — CUP PACEART REMOTE DEVICE CHECK
Brady Statistic RV Percent Paced: 0 %
Date Time Interrogation Session: 20161103081900
HIGH POWER IMPEDANCE MEASURED VALUE: 59 Ohm
Implantable Lead Implant Date: 20050923
Implantable Lead Model: 158
Implantable Lead Serial Number: 154635
Lead Channel Impedance Value: 497 Ohm
Lead Channel Sensing Intrinsic Amplitude: 14.4 mV
Lead Channel Setting Sensing Sensitivity: 0.4 mV
MDC IDC LEAD LOCATION: 753860
MDC IDC MSMT BATTERY REMAINING LONGEVITY: 126 mo
MDC IDC MSMT BATTERY REMAINING PERCENTAGE: 100 %
MDC IDC PG SERIAL: 118170
MDC IDC SET LEADCHNL RV PACING AMPLITUDE: 2.4 V
MDC IDC SET LEADCHNL RV PACING PULSEWIDTH: 0.4 ms

## 2015-10-14 NOTE — Progress Notes (Signed)
Cardiology Office Note   Date:  10/14/2015   ID:  Victor [name], DOB 02-11-1936, MRN [id number]  PCP:  Leonides Sake, MD  Cardiologist:  Dr. Carleene Overlie [place]/Dr. Peter Martinique   Electrophysiologist:  Dr. Cristopher Peru    Chief Complaint  Patient presents with  . Follow-up     no chest pain, occassional shortness of breath, no edema, has pain in legs, has cramping in legs,has lightheadedness, has dizziness     History of Present Illness: Victor [name] is a 79 y.o. male is seen for follow up CAD and CHF. He has a hx of CAD, status post CABG in 1990 with known occlusion of both vein grafts, PCI to the SVG-OM3 in 2014 and attempted CTO PCI of the RCA 09/2014 (angioplasty only-no stenting- incomplete reperfusion), chronic combined systolic and diastolic CHF, ischemic cardiomyopathy with history of VT in 2012, status post ICD, diabetes, CKD, HTN, HL, PAD, carotid stenosis.   Admitted 02/2015 with acute CHF exacerbation. He had new EKG changes and underwent LHC with unsuccessful repeat attempt at PCI of CTO RCA. Medical therapy was continued.  Admitted 8/7-8/9 with acute on chronic combined systolic and diastolic CHF. Patient has a history of orthostatic hypotension. He was diuresed cautiously with IV Lasix. On low dose aldactone.  Follow-up echo demonstrated no significant change with an EF of 35-40%. Discharge weight 187 pounds. The patient is noted to have orthostatic blood pressure drop. Compression stockings were recommended at discharge. He was discharged home on O2.  He was seen by Richardson Dopp on 07/07/15 and was hypoxic with sats 83% on RA that corrected on oxygen.   On follow up today he has been using oxygen regularly. He thinks it has helped a lot. He is sleeping better. Using compression hose. Still notes symptoms of low energy and leg fatigue that he feels may be related to back problems. He feels breathing is doing OK. Seen in the CHF clinic and it was felt that he was receiving  appropriate therapy. He does have leg weakness and has had 2-3 falls. He is scheduled for a CT of the neck. On Decadron which has elevated his sugar and insulin therapy has changed.   Studies/Reports Reviewed Today:  Echo 06/29/15 Mild LVH, EF 35-40%, inferior HK, mild MR, mild LAE, PASP 42 mmHg Impressions: - Compared to the prior study, there has been no significantinterval change.  LHC 03/20/15 Left mainstem: Normal  LAD: Proximal 30-40%, D1 ostial 80%.  LCx: The LCx is occluded distally. The first and second OM branches are occluded.  RCA: Proximal severely calcified 90%, mid occluded with what appeared to be a micro channel through the lesion with filling distally as well as some left to right collaterals.  PCI Data: Unable to cross with a wire.  Final Conclusions:  1. Severe 2 vessel obstructive CAD with chronic occlusion of OMs and RCA 2. Known occlusion of SVGs from prior caths. 3. Normal right heart and LV filling pressures. 4. Unsuccessful attempt at PCI of the RCA due to inability to cross occlusion in the mid vessel after the RV marginal branch. Recommendations: Continue medical therapy. Will review with Dr. Irish Lack to see if there are any further options for CTO PCI. If stable patient can be DC this weekend.   Myoview 02/05/14 High risk stress nuclear study with a large, severe intensity, partially reversible inferior lateral defect consistent with prior infarct and mild peri-infarct ischemia; possible transient ischemic dilatation of LV cavity.  LV Ejection Fraction:  25%. LV Wall Motion: Akinesis of the inferior lateral wall.    Past Medical History  Diagnosis Date  . Occlusion and stenosis of carotid artery without mention of cerebral infarction   . Raynaud's syndrome   . Paroxysmal ventricular tachycardia ([place])   . Peripheral vascular disease ([place])   . Cardiomyopathy, ischemic     a. s/p ICD placement  . Automatic implantable cardioverter-defibrillator in situ    . HLD (hyperlipidemia)   . GERD (gastroesophageal reflux disease)   . Chronic lower back pain   . Coronary artery disease     a. s/p CABG 1990. b. multiple PCIs since that time including CTO angioplasty (without stenting) PCI of RCA 09/2014, with last cath 02/2015 done for worsening CHF with unsuccessful attempt at PCI of the RCA due to inability to cross occlusion - medical therapy advised at that time.  . Hypertension   . Type II diabetes mellitus ([place])   . Arthritis   . TIA (transient ischemic attack)     a. s/p LHC on 10/01/14   . Chronic combined systolic and diastolic CHF (congestive heart failure) ([place])   . Stroke [name])     a. CT head 06/2015: Small old lacunar infarct LEFT basal ganglia.  . Orthostatic hypotension   . CKD (chronic kidney disease), stage III   . Anemia   . Thrombocytopenia [facility])     Past Surgical History  Procedure Laterality Date  . Anal fistulectomy  2000's  . Cataract extraction, bilateral Bilateral ~ 2009  . Cardiac defibrillator placement  02/1989-12/2011    "I've had a total of 6 or 7 defibrillators put in" (09/30/2014)  . Finger fracture surgery Left ~ 2000    "pointer"  . Coronary artery bypass graft  01/1989    "CABG X3"   . Fracture surgery    . Coronary angioplasty with stent placement  04/2013    "1"  . Cardiac catheterization  07/2014  . Coronary angioplasty  10/01/14    PTVA to CTO with restoration of TIMI 3  . Implantable cardioverter defibrillator (icd) generator change Left 10/05/2011    Procedure: ICD GENERATOR CHANGE;  Surgeon: Evans Lance, MD;  Location: [facility] C[facility] LAB;  Service: Cardiovascular;  Laterality: Left;  . Left and right heart catheterization with coronary/graft angiogram N/A 04/26/2013    Procedure: LEFT AND RIGHT HEART CATHETERIZATION WITH Beatrix Fetters;  Surgeon: Sherren Mocha, MD;  Location: [facility] CATH LAB;  Service: Cardiovascular;  Laterality: N/A;  . Percutaneous stent intervention  04/26/2013    Procedure:  PERCUTANEOUS STENT INTERVENTION;  Surgeon: Sherren Mocha, MD;  Location: [facility] CATH LAB;  Service: Cardiovascular;;  . Left heart catheterization with coronary angiogram N/A 08/01/2014    Procedure: LEFT HEART CATHETERIZATION WITH CORONARY ANGIOGRAM;  Surgeon: Sinclair Grooms, MD;  Location: [facility] CATH LAB;  Service: Cardiovascular;  Laterality: N/A;  . Right heart catheterization  10/01/2014    Procedure: RIGHT HEART CATH;  Surgeon: Peter M Martinique, MD;  Location: [facility] CATH LAB;  Service: Cardiovascular;;  . Cardiac catheterization  10/01/2014    Procedure: CORONARY BALLOON ANGIOPLASTY;  Surgeon: Peter M Martinique, MD;  Location: [facility] CATH LAB;  Service: Cardiovascular;;  . Left heart catheterization with coronary angiogram N/A 03/20/2015    Procedure: LEFT HEART CATHETERIZATION WITH CORONARY ANGIOGRAM;  Surgeon: Peter M Martinique, MD;  Location: [facility] CATH LAB;  Service: Cardiovascular;  Laterality: N/A;     Current Outpatient Prescriptions  Medication Sig Dispense Refill  . aspirin (ASPIR-81) 81 MG  EC tablet Take 81 mg by mouth at bedtime.     . bisoprolol (ZEBETA) 5 MG tablet Take 0.5 tablets (2.5 mg total) by mouth daily. 45 tablet 3  . clopidogrel (PLAVIX) 75 MG tablet TAKE 1 TABLET EVERY DAY WITH BREAKFAST 90 tablet 3  . dexamethasone (DECADRON) 4 MG tablet Take 4 mg by mouth daily as needed (back pain).   0  . furosemide (LASIX) 40 MG tablet Take 2 tablets (80 mg total) by mouth 2 (two) times daily. 180 tablet 3  . HYDROcodone-acetaminophen (NORCO) 10-325 MG per tablet Take 1 tablet by mouth every 8 (eight) hours as needed for moderate pain.   0  . insulin aspart protamine- aspart (NOVOLOG MIX 70/30) (70-30) 100 UNIT/ML injection Inject 10-30 Units into the skin 2 (two) times daily with a meal. Sliding scale    . Insulin Glargine (TOUJEO SOLOSTAR) 300 UNIT/ML SOPN Inject into the skin.    Marland Kitchen lisinopril (PRINIVIL,ZESTRIL) 2.5 MG tablet Take 1 tablet (2.5 mg total) by mouth daily. 90 tablet 3  . LYRICA 150  MG capsule Take 150 mg by mouth daily.   1  . metFORMIN (GLUCOPHAGE) 1000 MG tablet Take 1,000 mg by mouth 2 (two) times daily with a meal.    . mupirocin ointment (BACTROBAN) 2 % Apply 1 application topically 2 (two) times daily.  3  . nitroGLYCERIN (NITROSTAT) 0.4 MG SL tablet Place 0.4 mg under the tongue every 5 (five) minutes as needed for chest pain.    . ONE TOUCH ULTRA TEST test strip CHECK SUGAR AS DIRECTED 3 TIMES DAILY  5  . pravastatin (PRAVACHOL) 40 MG tablet Take 1 tablet (40 mg total) by mouth at bedtime. 90 tablet 3  . SANTYL ointment Apply 1 application topically every other day.  2  . spironolactone (ALDACTONE) 25 MG tablet Take 0.5 tablets (12.5 mg total) by mouth daily. 90 tablet 3  . vitamin B-12 (CYANOCOBALAMIN) 1000 MCG tablet Take 1,000 mcg by mouth daily.     No current facility-administered medications for this visit.    Allergies:   Coreg; Lovastatin; and Ativan    Social History:  The patient  reports that he has quit smoking. His smoking use included Pipe. He has never used smokeless tobacco. He reports that he drinks alcohol. He reports that he does not use illicit drugs.   Family History:  The patient's family history includes Heart attack in his father; Heart failure (age of onset: 62) in his mother; Leukemia in his sister; Stomach cancer in his maternal grandfather.    ROS:   Please see the history of present illness.   Review of Systems  Constitution: Positive for malaise/fatigue. Negative for decreased appetite.  HENT: Negative for headaches.   Cardiovascular: Positive for dyspnea on exertion. Negative for leg swelling.  Respiratory: Positive for shortness of breath. Negative for wheezing.   Hematologic/Lymphatic: Negative for bleeding problem. Does not bruise/bleed easily.  Musculoskeletal: Positive for back pain, joint pain and muscle weakness.  Gastrointestinal: Negative for bloating.  Neurological: Negative for dizziness and loss of balance.    Psychiatric/Behavioral: The patient is nervous/anxious.   All other systems reviewed and are negative.     PHYSICAL EXAM: VS:  BP 114/76 mmHg  Pulse 64  Ht 5\' 8"  (1.727 m)  Wt 87.544 kg (193 lb)  BMI 29.35 kg/m2    Wt Readings from Last 3 Encounters:  10/14/15 87.544 kg (193 lb)  08/12/15 85.503 kg (188 lb 8 oz)  07/24/15 86.592  kg (190 lb 14.4 oz)     GEN: Well nourished, well developed, in no acute distress HEENT: normal Neck: no JVD,   no masses Cardiac:  Normal S1/S2, RRR; no murmur ,  no rubs or gallops, No edema Respiratory:  Decreased breath sounds bilaterally, no wheezing, rhonchi or rales. GI: soft, nontender, nondistended, + BS MS: no deformity or atrophy Skin: warm and dry  Neuro:  CNs II-XII intact, Strength and sensation are intact Psych: Normal affect   EKG:  EKG is not ordered today.    Recent Labs: 06/28/2015: ALT 18; Magnesium 1.9; TSH 1.001 06/30/2015: B Natriuretic Peptide 388.2*; Hemoglobin 10.3*; Platelets 139* 07/07/2015: Pro B Natriuretic peptide (BNP) 473.0* 09/01/2015: BUN 30*; Creat 1.07; Potassium 3.9; Sodium 138    Lipid Panel    Component Value Date/Time   CHOL 127 06/29/2015 0508   TRIG 111 06/29/2015 0508   HDL 36* 06/29/2015 0508   CHOLHDL 3.5 06/29/2015 0508   VLDL 22 06/29/2015 0508   LDLCALC 69 06/29/2015 0508      ASSESSMENT AND PLAN:  1. Chronic combined systolic and diastolic CHF:  Overall, appears euvolemic today. Titration of medications limited by  orthostatic hypotension. His weight is stable.  Recommend continued use of oxygen and compression hose.Continue aldactone 12.5 mg bid. BMET has been stable. Need to watch closely for orthostatic hypotension that has limited medication titration previously. GI will follow up in 3 months with BMET, BNP, and CBC.   2. Cardiomyopathy, ischemic:  Last EF 35-40% by echocardiogram. Continue beta blocker, ACE inhibitor.   3. Coronary artery disease involving native coronary artery of  native heart without angina pectoris:   He had a  heart catheterization demonstrating severe 2 vessel CAD with chronic occlusion of the obtuse marginal and RCA with unsuccessful attempt at CTO  PCI of the RCA. Continue aspirin, beta blocker, Plavix, statin. He is not a candidate for further revascularization.  4. Essential hypertension:  Controlled.  5. Orthostatic hypotension:  Continue  compression stockings to help with his orthostatic hypotension.   6. Hyperlipidemia:    continue statin.  7. ICD- BS Nov 2012 for VT:  Follow-up with EP as planned.   8. Type 2 diabetes mellitus with other circulatory complications:  Follow-up with primary care.  9. CKD (chronic kidney disease), class 3.    10. Back pain, s/p epidural injection. Now scheduled for assessment of cervical spine.      Medication Changes: Current medicines are reviewed at length with the patient today.  Concerns regarding medicines are as outlined above.  The following changes have been made:   Discontinued Medications   No medications on file   Modified Medications   No medications on file   New Prescriptions   No medications on file    Labs/ tests ordered today include:   Orders Placed This Encounter  Procedures  . Basic metabolic panel  . B Nat Peptide  . CBC w/Diff/Platelet     Disposition:    FU with with Dr. Martinique in 3 months.     Signed, Peter Martinique MD, [facility]    10/14/2015 5:41 PM

## 2015-10-14 NOTE — Patient Instructions (Signed)
Continue your current therapy   I will see you in 3 months. 

## 2015-10-23 ENCOUNTER — Ambulatory Visit
Admission: RE | Admit: 2015-10-23 | Discharge: 2015-10-23 | Disposition: A | Discharge status: Home / Self Care | Payer: Medicare Other | Source: Ambulatory Visit | Attending: Orthopedic Surgery | Admitting: Orthopedic Surgery

## 2015-10-23 DIAGNOSIS — M5136 Other intervertebral disc degeneration, lumbar region: Secondary | ICD-10-CM

## 2015-10-23 DIAGNOSIS — M479 Spondylosis, unspecified: Secondary | ICD-10-CM

## 2015-10-23 DIAGNOSIS — M530 Cervicocranial syndrome: Secondary | ICD-10-CM

## 2015-10-23 DIAGNOSIS — M4802 Spinal stenosis, cervical region: Secondary | ICD-10-CM | POA: Diagnosis not present

## 2015-10-26 ENCOUNTER — Other Ambulatory Visit: Payer: Self-pay | Admitting: Cardiology

## 2015-10-26 DIAGNOSIS — M5412 Radiculopathy, cervical region: Secondary | ICD-10-CM | POA: Diagnosis not present

## 2015-10-26 DIAGNOSIS — I5022 Chronic systolic (congestive) heart failure: Secondary | ICD-10-CM

## 2015-10-26 MED ORDER — FUROSEMIDE 40 MG PO TABS: 80.0000 mg | ORAL_TABLET | Freq: Two times a day (BID) | ORAL | Status: DC

## 2015-10-28 ENCOUNTER — Encounter: Payer: Self-pay | Admitting: Cardiology

## 2015-11-08 ENCOUNTER — Other Ambulatory Visit: Payer: Self-pay | Admitting: Internal Medicine

## 2015-11-11 DIAGNOSIS — R233 Spontaneous ecchymoses: Secondary | ICD-10-CM | POA: Diagnosis not present

## 2015-11-11 DIAGNOSIS — L570 Actinic keratosis: Secondary | ICD-10-CM | POA: Diagnosis not present

## 2015-11-12 ENCOUNTER — Other Ambulatory Visit: Payer: Self-pay | Admitting: Cardiology

## 2015-11-12 MED ORDER — LISINOPRIL 2.5 MG PO TABS
2.5000 mg | ORAL_TABLET | Freq: Every day | ORAL | Status: DC
Start: 2015-11-12 — End: 2015-12-23

## 2015-11-18 DIAGNOSIS — E7800 Pure hypercholesterolemia, unspecified: Secondary | ICD-10-CM | POA: Diagnosis not present

## 2015-11-18 DIAGNOSIS — Z6829 Body mass index (BMI) 29.0-29.9, adult: Secondary | ICD-10-CM | POA: Diagnosis not present

## 2015-11-18 DIAGNOSIS — M5030 Other cervical disc degeneration, unspecified cervical region: Secondary | ICD-10-CM | POA: Diagnosis not present

## 2015-11-18 DIAGNOSIS — Z79899 Other long term (current) drug therapy: Secondary | ICD-10-CM | POA: Diagnosis not present

## 2015-11-18 DIAGNOSIS — Z125 Encounter for screening for malignant neoplasm of prostate: Secondary | ICD-10-CM | POA: Diagnosis not present

## 2015-11-18 DIAGNOSIS — E1149 Type 2 diabetes mellitus with other diabetic neurological complication: Secondary | ICD-10-CM | POA: Diagnosis not present

## 2015-12-22 DIAGNOSIS — M5030 Other cervical disc degeneration, unspecified cervical region: Secondary | ICD-10-CM | POA: Diagnosis not present

## 2015-12-22 DIAGNOSIS — F329 Major depressive disorder, single episode, unspecified: Secondary | ICD-10-CM | POA: Diagnosis not present

## 2015-12-22 DIAGNOSIS — E1149 Type 2 diabetes mellitus with other diabetic neurological complication: Secondary | ICD-10-CM | POA: Diagnosis not present

## 2015-12-22 DIAGNOSIS — Z6829 Body mass index (BMI) 29.0-29.9, adult: Secondary | ICD-10-CM | POA: Diagnosis not present

## 2015-12-23 ENCOUNTER — Encounter: Payer: Self-pay | Admitting: Internal Medicine

## 2015-12-23 ENCOUNTER — Ambulatory Visit (INDEPENDENT_AMBULATORY_CARE_PROVIDER_SITE_OTHER): Payer: Medicare Other | Admitting: Internal Medicine

## 2015-12-23 VITALS — BP 110/48 | HR 68 | Ht 68.0 in | Wt 185.0 lb

## 2015-12-23 DIAGNOSIS — I2589 Other forms of chronic ischemic heart disease: Secondary | ICD-10-CM | POA: Diagnosis not present

## 2015-12-23 DIAGNOSIS — I255 Ischemic cardiomyopathy: Secondary | ICD-10-CM

## 2015-12-23 DIAGNOSIS — I472 Ventricular tachycardia: Secondary | ICD-10-CM | POA: Diagnosis not present

## 2015-12-23 DIAGNOSIS — J438 Other emphysema: Secondary | ICD-10-CM

## 2015-12-23 DIAGNOSIS — I5022 Chronic systolic (congestive) heart failure: Secondary | ICD-10-CM | POA: Diagnosis not present

## 2015-12-23 DIAGNOSIS — Z95810 Presence of automatic (implantable) cardiac defibrillator: Secondary | ICD-10-CM | POA: Diagnosis not present

## 2015-12-23 DIAGNOSIS — I4729 Other ventricular tachycardia: Secondary | ICD-10-CM

## 2015-12-23 DIAGNOSIS — J439 Emphysema, unspecified: Secondary | ICD-10-CM | POA: Insufficient documentation

## 2015-12-23 LAB — CBC WITH DIFFERENTIAL/PLATELET
BASOS ABS: 0 10*3/uL (ref 0.0–0.1)
Basophils Relative: 0 % (ref 0–1)
EOS ABS: 0.2 10*3/uL (ref 0.0–0.7)
EOS PCT: 2 % (ref 0–5)
HCT: 36 % — ABNORMAL LOW (ref 39.0–52.0)
Hemoglobin: 11.7 g/dL — ABNORMAL LOW (ref 13.0–17.0)
LYMPHS ABS: 2.4 10*3/uL (ref 0.7–4.0)
Lymphocytes Relative: 23 % (ref 12–46)
MCH: 28.1 pg (ref 26.0–34.0)
MCHC: 32.5 g/dL (ref 30.0–36.0)
MCV: 86.5 fL (ref 78.0–100.0)
MPV: 10.6 fL (ref 8.6–12.4)
Monocytes Absolute: 0.5 10*3/uL (ref 0.1–1.0)
Monocytes Relative: 5 % (ref 3–12)
Neutro Abs: 7.3 10*3/uL (ref 1.7–7.7)
Neutrophils Relative %: 70 % (ref 43–77)
PLATELETS: 188 10*3/uL (ref 150–400)
RBC: 4.16 MIL/uL — AB (ref 4.22–5.81)
RDW: 14.5 % (ref 11.5–15.5)
WBC: 10.4 10*3/uL (ref 4.0–10.5)

## 2015-12-23 LAB — CUP PACEART INCLINIC DEVICE CHECK
HIGH POWER IMPEDANCE MEASURED VALUE: 36 Ohm
HighPow Impedance: 65 Ohm
Implantable Lead Implant Date: 20050923
Lead Channel Sensing Intrinsic Amplitude: 15 mV
Lead Channel Setting Pacing Pulse Width: 0.4 ms
MDC IDC LEAD LOCATION: 753860
MDC IDC LEAD MODEL: 158
MDC IDC LEAD SERIAL: 154635
MDC IDC MSMT LEADCHNL RV IMPEDANCE VALUE: 620 Ohm
MDC IDC MSMT LEADCHNL RV PACING THRESHOLD AMPLITUDE: 0.9 V
MDC IDC MSMT LEADCHNL RV PACING THRESHOLD PULSEWIDTH: 0.4 ms
MDC IDC PG SERIAL: 118170
MDC IDC SESS DTM: 20170201050000
MDC IDC SET LEADCHNL RV PACING AMPLITUDE: 2.4 V
MDC IDC SET LEADCHNL RV SENSING SENSITIVITY: 0.4 mV

## 2015-12-23 MED ORDER — CLOPIDOGREL BISULFATE 75 MG PO TABS: 75.0000 mg | ORAL_TABLET | Freq: Every day | ORAL | Status: DC

## 2015-12-23 MED ORDER — LISINOPRIL 2.5 MG PO TABS: 2.5000 mg | ORAL_TABLET | Freq: Every day | ORAL | Status: DC

## 2015-12-23 NOTE — Progress Notes (Signed)
HPI [name] returns today for followup. He is a pleasant 80 yo man with an ICM, chronic systolic heart failure, VT, s/p ICD implant. Because of worsening dyspnea, he was referred for PCI of a totatlly occluded RCA which could not be opened. He has been on oxygen therapy and feels better. No ICD shocks. No syncope. He has become more sedentary. He has c/o wheezing with no sputum production. He iis on chronic oxygen by N/C. Allergies  Allergen Reactions  . Coreg [Carvedilol] Other (See Comments)    Shortness of breath ,fatigue ,dizzyness   . Lovastatin Other (See Comments)    CK elevation, Myalgias  . Ativan [Lorazepam] Other (See Comments)    Pt. had side effects     Current Outpatient Prescriptions  Medication Sig Dispense Refill  . aspirin (ASPIR-81) 81 MG EC tablet Take 81 mg by mouth at bedtime.     . bisoprolol (ZEBETA) 5 MG tablet Take 0.5 tablets (2.5 mg total) by mouth daily. 45 tablet 3  . clopidogrel (PLAVIX) 75 MG tablet Take 1 tablet (75 mg total) by mouth daily. 90 tablet 3  . dexamethasone (DECADRON) 4 MG tablet Take 4 mg by mouth daily as needed (back pain).   0  . furosemide (LASIX) 40 MG tablet Take 40 mg by mouth 3 (three) times daily. Can take 1 extra tablet daily as needed for swelling    . HYDROcodone-acetaminophen (NORCO) 10-325 MG per tablet Take 1 tablet by mouth every 8 (eight) hours as needed for moderate pain.   0  . Insulin Glargine (TOUJEO SOLOSTAR) 300 UNIT/ML SOPN Inject 35 Units into the skin daily.     Marland Kitchen lisinopril (PRINIVIL,ZESTRIL) 2.5 MG tablet Take 1 tablet (2.5 mg total) by mouth daily. 90 tablet 3  . LYRICA 150 MG capsule Take 150 mg by mouth daily.   1  . metFORMIN (GLUCOPHAGE) 1000 MG tablet Take 1,000 mg by mouth 2 (two) times daily with a meal.    . mupirocin ointment (BACTROBAN) 2 % Apply 1 application topically 2 (two) times daily.  3  . nitroGLYCERIN (NITROSTAT) 0.4 MG SL tablet Place 0.4 mg under the tongue every 5 (five) minutes as  needed for chest pain.    . ONE TOUCH ULTRA TEST test strip CHECK SUGAR AS DIRECTED 3 TIMES DAILY  5  . pravastatin (PRAVACHOL) 40 MG tablet Take 1 tablet (40 mg total) by mouth at bedtime. 90 tablet 3  . SANTYL ointment Apply 1 application topically every other day.  2  . spironolactone (ALDACTONE) 25 MG tablet Take 0.5 tablets (12.5 mg total) by mouth daily. 90 tablet 3  . vitamin B-12 (CYANOCOBALAMIN) 1000 MCG tablet Take 1,000 mcg by mouth daily.     No current facility-administered medications for this visit.     Past Medical History  Diagnosis Date  . Occlusion and stenosis of carotid artery without mention of cerebral infarction   . Raynaud's syndrome   . Paroxysmal ventricular tachycardia ([place])   . Peripheral vascular disease ([place])   . Cardiomyopathy, ischemic     a. s/p ICD placement  . Automatic implantable cardioverter-defibrillator in situ   . HLD (hyperlipidemia)   . GERD (gastroesophageal reflux disease)   . Chronic lower back pain   . Coronary artery disease     a. s/p CABG 1990. b. multiple PCIs since that time including CTO angioplasty (without stenting) PCI of RCA 09/2014, with last cath 02/2015 done for worsening CHF with unsuccessful  attempt at PCI of the RCA due to inability to cross occlusion - medical therapy advised at that time.  . Hypertension   . Type II diabetes mellitus ([place])   . Arthritis   . TIA (transient ischemic attack)     a. s/p LHC on 10/01/14   . Chronic combined systolic and diastolic CHF (congestive heart failure) ([place])   . Stroke [facility])     a. CT head 06/2015: Small old lacunar infarct LEFT basal ganglia.  . Orthostatic hypotension   . CKD (chronic kidney disease), stage III   . Anemia   . Thrombocytopenia ([place])     ROS:   All systems reviewed and negative except as noted in the HPI.   Past Surgical History  Procedure Laterality Date  . Anal fistulectomy  2000's  . Cataract extraction, bilateral Bilateral ~ 2009  . Cardiac  defibrillator placement  02/1989-12/2011    "I've had a total of 6 or 7 defibrillators put in" (09/30/2014)  . Finger fracture surgery Left ~ 2000    "pointer"  . Coronary artery bypass graft  01/1989    "CABG X3"   . Fracture surgery    . Coronary angioplasty with stent placement  04/2013    "1"  . Cardiac catheterization  07/2014  . Coronary angioplasty  10/01/14    PTVA to CTO with restoration of TIMI 3  . Implantable cardioverter defibrillator (icd) generator change Left 10/05/2011    Procedure: ICD GENERATOR CHANGE;  Surgeon: Evans Lance, MD;  Location: [facility] CATH LAB;  Service: Cardiovascular;  Laterality: Left;  . Left and right heart catheterization with coronary/graft angiogram N/A 04/26/2013    Procedure: LEFT AND RIGHT HEART CATHETERIZATION WITH Beatrix Fetters;  Surgeon: Sherren Mocha, MD;  Location: [facility] CATH LAB;  Service: Cardiovascular;  Laterality: N/A;  . Percutaneous stent intervention  04/26/2013    Procedure: PERCUTANEOUS STENT INTERVENTION;  Surgeon: Sherren Mocha, MD;  Location: [facility] CATH LAB;  Service: Cardiovascular;;  . Left heart catheterization with coronary angiogram N/A 08/01/2014    Procedure: LEFT HEART CATHETERIZATION WITH CORONARY ANGIOGRAM;  Surgeon: Sinclair Grooms, MD;  Location: [facility] CATH LAB;  Service: Cardiovascular;  Laterality: N/A;  . Right heart catheterization  10/01/2014    Procedure: RIGHT HEART CATH;  Surgeon: Peter M Martinique, MD;  Location: [facility] CATH LAB;  Service: Cardiovascular;;  . Cardiac catheterization  10/01/2014    Procedure: CORONARY BALLOON ANGIOPLASTY;  Surgeon: Peter M Martinique, MD;  Location: [facility] CATH LAB;  Service: Cardiovascular;;  . Left heart catheterization with coronary angiogram N/A 03/20/2015    Procedure: LEFT HEART CATHETERIZATION WITH CORONARY ANGIOGRAM;  Surgeon: Peter M Martinique, MD;  Location: [facility] CATH LAB;  Service: Cardiovascular;  Laterality: N/A;     Family History  Problem Relation Age of Onset  . Heart failure Mother  74  . Heart attack Father   . Leukemia Sister   . Stomach cancer Maternal Grandfather      Social History   Social History  . Marital Status: Married    Spouse Name: N/A  . Number of Children: N/A  . Years of Education: N/A   Occupational History  . Not on file.   Social History Main Topics  . Smoking status: Former Smoker -- 3 years    Types: Pipe  . Smokeless tobacco: Never Used     Comment: "quit smoking pipe in early 1970's"  . Alcohol Use: Yes     Comment: 09/30/2014 "wine w/dinner maybe once/yr"  .  Drug Use: No  . Sexual Activity: No   Other Topics Concern  . 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      BP 110/48 mmHg  Pulse 68  Ht 5\' 8"  (1.727 m)  Wt 185 lb (83.915 kg)  BMI 28.14 kg/m2  Physical Exam:  Stable but chronically ill appearing NAD HEENT: Unremarkable Neck:  6 cm JVD, no thyromegally Back:  No CVA tenderness Lungs:  Clear except for basilar rales. HEART:  Regular rate rhythm, no murmurs, no rubs, no clicks, soft S4. Abd:  soft, positive bowel sounds, no organomegally, no rebound, no guarding Ext:  2 plus pulses, no edema, no cyanosis, no clubbing Skin:  No rashes no nodules Neuro:  CN II through XII intact, motor grossly intact  ICD interogation - normal device function.   Assess/Plan:

## 2015-12-23 NOTE — Assessment & Plan Note (Signed)
His symptoms are controlled. He will maintain a low sodium diet and his current meds. He thinks the oxygen has helped.

## 2015-12-23 NOTE — Assessment & Plan Note (Signed)
HIs b[place] sci device is working normally. Will recheck in several months.

## 2015-12-23 NOTE — Assessment & Plan Note (Signed)
He denies anginal symptoms. He will continue his current meds.  

## 2015-12-23 NOTE — Patient Instructions (Signed)
Medication Instructions:  Your physician recommends that you continue on your current medications as directed. Please refer to the Current Medication list given to you today.   Labwork: None ordered   Testing/Procedures: None ordered   Follow-Up: Your physician wants you to follow-up in: 12 months with Dr Knox Saliva will receive a reminder letter in the mail two months in advance. If you don't receive a letter, please call our office to schedule the follow-up appointment.   Remote monitoring is used to monitor your  ICD from home. This monitoring reduces the number of office visits required to check your device to one time per year. It allows [place] to keep an eye on the functioning of your device to ensure it is working properly. You are scheduled for a device check from home on 03/23/16. You may send your transmission at any time that day. If you have a wireless device, the transmission will be sent automatically. After your physician reviews your transmission, you will receive a postcard with your next transmission date.    Any Other Special Instructions Will Be Listed Below (If Applicable).     If you need a refill on your cardiac medications before your next appointment, please call your pharmacy.

## 2015-12-23 NOTE — Assessment & Plan Note (Signed)
He has had no sustained VT in the past year. He will continue his current meds.

## 2015-12-24 LAB — BRAIN NATRIURETIC PEPTIDE: BRAIN NATRIURETIC PEPTIDE: 227.8 pg/mL — AB (ref 0.0–100.0)

## 2015-12-24 LAB — BASIC METABOLIC PANEL
BUN: 44 mg/dL — AB (ref 7–25)
CHLORIDE: 92 mmol/L — AB (ref 98–110)
CO2: 31 mmol/L (ref 20–31)
Calcium: 9.1 mg/dL (ref 8.6–10.3)
Creat: 1.5 mg/dL — ABNORMAL HIGH (ref 0.70–1.18)
Glucose, Bld: 308 mg/dL — ABNORMAL HIGH (ref 65–99)
POTASSIUM: 4.5 mmol/L (ref 3.5–5.3)
Sodium: 135 mmol/L (ref 135–146)

## 2015-12-25 ENCOUNTER — Telehealth: Payer: Self-pay | Admitting: Cardiology

## 2015-12-25 ENCOUNTER — Other Ambulatory Visit: Payer: Self-pay

## 2015-12-25 NOTE — Telephone Encounter (Signed)
Returned call to patient no answer.LMTC. 

## 2015-12-25 NOTE — Telephone Encounter (Signed)
New message ° ° ° ° °Returning a call to the nurse °

## 2015-12-25 NOTE — Telephone Encounter (Signed)
Received call from patient's wife.Recent bmet results given.Advised to decrease lasix to 60 mg twice a day.

## 2016-01-15 ENCOUNTER — Ambulatory Visit (INDEPENDENT_AMBULATORY_CARE_PROVIDER_SITE_OTHER): Payer: Medicare Other | Admitting: Cardiology

## 2016-01-15 ENCOUNTER — Encounter: Payer: Self-pay | Admitting: Cardiology

## 2016-01-15 VITALS — BP 120/56 | HR 86 | Ht 68.0 in | Wt 183.0 lb

## 2016-01-15 DIAGNOSIS — E1159 Type 2 diabetes mellitus with other circulatory complications: Secondary | ICD-10-CM

## 2016-01-15 DIAGNOSIS — I4729 Other ventricular tachycardia: Secondary | ICD-10-CM

## 2016-01-15 DIAGNOSIS — I472 Ventricular tachycardia: Secondary | ICD-10-CM

## 2016-01-15 DIAGNOSIS — I10 Essential (primary) hypertension: Secondary | ICD-10-CM | POA: Diagnosis not present

## 2016-01-15 DIAGNOSIS — I5022 Chronic systolic (congestive) heart failure: Secondary | ICD-10-CM

## 2016-01-15 DIAGNOSIS — I255 Ischemic cardiomyopathy: Secondary | ICD-10-CM

## 2016-01-15 NOTE — Patient Instructions (Signed)
Reduce lasix to 40 mg twice a day  Continue your other therapy  I will see you in 6 months.

## 2016-01-15 NOTE — Progress Notes (Signed)
Cardiology Office Note   Date:  01/15/2016   ID:  Victor [name], DOB August 02, 1936, MRN [id number]  PCP:  Leonides Sake, MD  Cardiologist:  Dr. Carleene Overlie Taylor/Dr. Stacye Noori Martinique   Electrophysiologist:  Dr. Cristopher Peru    Chief Complaint  Patient presents with  . Appointment    had some chest pain about a week ago, 1 nitro helped. some tiredness and fatigue. has had soem legs tiredness and discomfort from the knees down.      History of Present Illness: Victor [name] is a 80 y.o. male is seen for follow up CAD and CHF. He has a hx of CAD, status post CABG in 1990 with known occlusion of both vein grafts, PCI to the SVG-OM3 in 2014 and attempted CTO PCI of the RCA 09/2014. Chronic combined systolic and diastolic CHF, ischemic cardiomyopathy with history of VT in 2012, status post ICD, diabetes, CKD, HTN, HL, PAD, carotid stenosis.   Admitted 02/2015 with acute CHF exacerbation. He had new EKG changes and underwent LHC with unsuccessful repeat attempt at PCI of CTO RCA. Medical therapy was continued.  Admitted 8/7-06/30/15 with acute on chronic combined systolic and diastolic CHF. Patient has a history of orthostatic hypotension.   Follow-up echo demonstrated no significant change with an EF of 35-40%.   On follow up today he has been using oxygen regularly.  He notes 2 recent episodes of acute dizziness with standing. On one occasion he fell to the floor. Noted some vertigo. Denies any chest pain. Breathing is stable. He has a lot of neck and back pain. Takes decadron and hydrocodone when pain gets bad. Complains of anhedonia and fatigue. Started on Cymbalta recently for depression.    Studies/Reports Reviewed Today:  Echo 06/29/15 Mild LVH, EF 35-40%, inferior HK, mild MR, mild LAE, PASP 42 mmHg Impressions: - Compared to the prior study, there has been no significantinterval change.  LHC 03/20/15 Left mainstem: Normal  LAD: Proximal 30-40%, D1 ostial 80%.  LCx: The LCx is occluded  distally. The first and second OM branches are occluded.  RCA: Proximal severely calcified 90%, mid occluded with what appeared to be a micro channel through the lesion with filling distally as well as some left to right collaterals.  PCI Data: Unable to cross with a wire.  Final Conclusions:  1. Severe 2 vessel obstructive CAD with chronic occlusion of OMs and RCA 2. Known occlusion of SVGs from prior caths. 3. Normal right heart and LV filling pressures. 4. Unsuccessful attempt at PCI of the RCA due to inability to cross occlusion in the mid vessel after the RV marginal branch. Recommendations: Continue medical therapy. Will review with Dr. Irish Lack to see if there are any further options for CTO PCI. If stable patient can be DC this weekend.   Myoview 02/05/14 High risk stress nuclear study with a large, severe intensity, partially reversible inferior lateral defect consistent with prior infarct and mild peri-infarct ischemia; possible transient ischemic dilatation of LV cavity.  LV Ejection Fraction: 25%. LV Wall Motion: Akinesis of the inferior lateral wall.    Past Medical History  Diagnosis Date  . Occlusion and stenosis of carotid artery without mention of cerebral infarction   . Raynaud's syndrome   . Paroxysmal ventricular tachycardia ([place])   . Peripheral vascular disease ([place])   . Cardiomyopathy, ischemic     a. s/p ICD placement  . Automatic implantable cardioverter-defibrillator in situ   . HLD (hyperlipidemia)   . GERD (gastroesophageal reflux  disease)   . Chronic lower back pain   . Coronary artery disease     a. s/p CABG 1990. b. multiple PCIs since that time including CTO angioplasty (without stenting) PCI of RCA 09/2014, with last cath 02/2015 done for worsening CHF with unsuccessful attempt at PCI of the RCA due to inability to cross occlusion - medical therapy advised at that time.  . Hypertension   . Type II diabetes mellitus ([place])   . Arthritis   . TIA  (transient ischemic attack)     a. s/p LHC on 10/01/14   . Chronic combined systolic and diastolic CHF (congestive heart failure) ([place])   . Stroke [facility])     a. CT head 06/2015: Small old lacunar infarct LEFT basal ganglia.  . Orthostatic hypotension   . CKD (chronic kidney disease), stage III   . Anemia   . Thrombocytopenia [facility])     Past Surgical History  Procedure Laterality Date  . Anal fistulectomy  2000's  . Cataract extraction, bilateral Bilateral ~ 2009  . Cardiac defibrillator placement  02/1989-12/2011    "I've had a total of 6 or 7 defibrillators put in" (09/30/2014)  . Finger fracture surgery Left ~ 2000    "pointer"  . Coronary artery bypass graft  01/1989    "CABG X3"   . Fracture surgery    . Coronary angioplasty with stent placement  04/2013    "1"  . Cardiac catheterization  07/2014  . Coronary angioplasty  10/01/14    PTVA to CTO with restoration of TIMI 3  . Implantable cardioverter defibrillator (icd) generator change Left 10/05/2011    Procedure: ICD GENERATOR CHANGE;  Surgeon: Evans Lance, MD;  Location: [facility] CATH LAB;  Service: Cardiovascular;  Laterality: Left;  . Left and right heart catheterization with coronary/graft angiogram N/A 04/26/2013    Procedure: LEFT AND RIGHT HEART CATHETERIZATION WITH Beatrix Fetters;  Surgeon: Sherren Mocha, MD;  Location: [facility] CATH LAB;  Service: Cardiovascular;  Laterality: N/A;  . Percutaneous stent intervention  04/26/2013    Procedure: PERCUTANEOUS STENT INTERVENTION;  Surgeon: Sherren Mocha, MD;  Location: [facility] CATH LAB;  Service: Cardiovascular;;  . Left heart catheterization with coronary angiogram N/A 08/01/2014    Procedure: LEFT HEART CATHETERIZATION WITH CORONARY ANGIOGRAM;  Surgeon: Sinclair Grooms, MD;  Location: [facility] CATH LAB;  Service: Cardiovascular;  Laterality: N/A;  . Right heart catheterization  10/01/2014    Procedure: RIGHT HEART CATH;  Surgeon: Annebelle Bostic M Martinique, MD;  Location: [facility] CATH LAB;  Service:  Cardiovascular;;  . Cardiac catheterization  10/01/2014    Procedure: CORONARY BALLOON ANGIOPLASTY;  Surgeon: Kashon Kraynak M Martinique, MD;  Location: [facility] CATH LAB;  Service: Cardiovascular;;  . Left heart catheterization with coronary angiogram N/A 03/20/2015    Procedure: LEFT HEART CATHETERIZATION WITH CORONARY ANGIOGRAM;  Surgeon: Tejas Seawood M Martinique, MD;  Location: [facility] CATH LAB;  Service: Cardiovascular;  Laterality: N/A;     Current Outpatient Prescriptions  Medication Sig Dispense Refill  . aspirin (ASPIR-81) 81 MG EC tablet Take 81 mg by mouth at bedtime.     . bisoprolol (ZEBETA) 5 MG tablet Take 0.5 tablets (2.5 mg total) by mouth daily. 45 tablet 3  . clopidogrel (PLAVIX) 75 MG tablet Take 1 tablet (75 mg total) by mouth daily. 90 tablet 3  . dexamethasone (DECADRON) 4 MG tablet Take 4 mg by mouth daily as needed (back pain).   0  . DULoxetine (CYMBALTA) 30 MG capsule Take one capsule by mouth  daily for 1 week then increase to 2 capsules daily.    . furosemide (LASIX) 40 MG tablet Take 40 mg by mouth 2 (two) times daily. 90 tablet 3  . HYDROcodone-acetaminophen (NORCO) 10-325 MG per tablet Take 1 tablet by mouth every 8 (eight) hours as needed for moderate pain.   0  . Insulin Glargine (TOUJEO SOLOSTAR) 300 UNIT/ML SOPN Inject 35 Units into the skin daily.     Marland Kitchen lisinopril (PRINIVIL,ZESTRIL) 2.5 MG tablet Take 1 tablet (2.5 mg total) by mouth daily. 90 tablet 3  . LYRICA 150 MG capsule Take 150 mg by mouth daily.   1  . metFORMIN (GLUCOPHAGE) 1000 MG tablet Take 1,000 mg by mouth 2 (two) times daily with a meal.    . mupirocin ointment (BACTROBAN) 2 % Apply 1 application topically 2 (two) times daily.  3  . nitroGLYCERIN (NITROSTAT) 0.4 MG SL tablet Place 0.4 mg under the tongue every 5 (five) minutes as needed for chest pain.    . ONE TOUCH ULTRA TEST test strip CHECK SUGAR AS DIRECTED 3 TIMES DAILY  5  . pravastatin (PRAVACHOL) 40 MG tablet Take 1 tablet (40 mg total) by mouth at bedtime. 90  tablet 3  . SANTYL ointment Apply 1 application topically every other day.  2  . spironolactone (ALDACTONE) 25 MG tablet Take 0.5 tablets (12.5 mg total) by mouth daily. 90 tablet 3  . vitamin B-12 (CYANOCOBALAMIN) 1000 MCG tablet Take 1,000 mcg by mouth daily.     No current facility-administered medications for this visit.    Allergies:   Coreg; Lovastatin; and Ativan    Social History:  The patient  reports that he has quit smoking. His smoking use included Pipe. He has never used smokeless tobacco. He reports that he drinks alcohol. He reports that he does not use illicit drugs.   Family History:  The patient's family history includes Heart attack in his father; Heart failure (age of onset: 59) in his mother; Leukemia in his sister; Stomach cancer in his maternal grandfather.    ROS:   Please see the history of present illness.   Review of Systems  Constitution: Positive for malaise/fatigue. Negative for decreased appetite.  Cardiovascular: Positive for dyspnea on exertion. Negative for leg swelling.  Respiratory: Positive for shortness of breath. Negative for wheezing.   Hematologic/Lymphatic: Negative for bleeding problem. Does not bruise/bleed easily.  Musculoskeletal: Positive for back pain and muscle weakness.  Gastrointestinal: Negative for bloating.  Neurological: Positive for dizziness and vertigo. Negative for loss of balance.  Psychiatric/Behavioral: The patient is nervous/anxious.   All other systems reviewed and are negative.     PHYSICAL EXAM: VS:  BP 120/56 mmHg  Pulse 86  Ht 5\' 8"  (1.727 m)  Wt 83.008 kg (183 lb)  BMI 27.83 kg/m2  SpO2 97%    Wt Readings from Last 3 Encounters:  01/15/16 83.008 kg (183 lb)  12/23/15 83.915 kg (185 lb)  10/14/15 87.544 kg (193 lb)     GEN: Well nourished, well developed, in no acute distress HEENT: normal Neck: no JVD,   no masses Cardiac:  Normal S1/S2, RRR; no murmur ,  no rubs or gallops, No edema Respiratory:   Decreased breath sounds bilaterally, no wheezing, rhonchi or rales. GI: soft, nontender, nondistended, + BS MS: no deformity or atrophy Skin: warm and dry  Neuro:  CNs II-XII intact, Strength and sensation are intact Psych: Normal affect   EKG:  EKG is not ordered today.  Recent Labs: 06/28/2015: ALT 18; Magnesium 1.9; TSH 1.001 06/30/2015: B Natriuretic Peptide 388.2* 07/07/2015: Pro B Natriuretic peptide (BNP) 473.0* 12/23/2015: BUN 44*; Creat 1.50*; Hemoglobin 11.7*; Platelets 188; Potassium 4.5; Sodium 135    Lipid Panel    Component Value Date/Time   CHOL 127 06/29/2015 0508   TRIG 111 06/29/2015 0508   HDL 36* 06/29/2015 0508   CHOLHDL 3.5 06/29/2015 0508   VLDL 22 06/29/2015 0508   LDLCALC 69 06/29/2015 0508      ASSESSMENT AND PLAN:  1. Chronic combined systolic and diastolic CHF:  Overall, appears euvolemic today. Weight is down 10 lbs since November. Titration of medications limited by  orthostatic hypotension.  Recommend continued use of oxygen and compression hose.Continue aldactone 12.5 mg bid. Will reduce lasix to 40 mg bid given recent dizziness.  Need to watch closely for orthostatic hypotension that has limited medication titration previously.   2. Cardiomyopathy, ischemic:  Last EF 35-40% by echocardiogram. Continue beta blocker, ACE inhibitor.   3. Coronary artery disease involving native coronary artery of native heart without angina pectoris:   He had a  heart catheterization demonstrating severe 2 vessel CAD with chronic occlusion of the obtuse marginal and RCA with unsuccessful attempt at CTO  PCI of the RCA. Continue aspirin, beta blocker, Plavix, statin. He is not a candidate for further revascularization.  4. Essential hypertension:  Controlled.  5. Orthostatic hypotension:  Continue  compression stockings to help with his orthostatic hypotension. Reduce lasix as noted above.   6. Hyperlipidemia:    continue statin.  7. ICD- BS Nov 2012 for VT: No VT  seen on follow up ICD check.  8. Type 2 diabetes mellitus with other circulatory complications:  Follow-up with primary care.  9. CKD (chronic kidney disease), class 3.    10. Back pain/cervical pain.   11. Depression. Now on Cymbalta.     Medication Changes: Current medicines are reviewed at length with the patient today.  Concerns regarding medicines are as outlined above.  The following changes have been made:  See above  Labs/ tests ordered today include:   No orders of the defined types were placed in this encounter.     Disposition:    FU with with Dr. Martinique in 6 months.     Signed, Judine Arciniega Martinique MD, [facility]    01/15/2016 9:45 AM

## 2016-01-18 ENCOUNTER — Other Ambulatory Visit: Payer: Self-pay | Admitting: Orthopedic Surgery

## 2016-01-18 ENCOUNTER — Telehealth: Payer: Self-pay | Admitting: Cardiology

## 2016-01-18 DIAGNOSIS — M5412 Radiculopathy, cervical region: Secondary | ICD-10-CM

## 2016-01-18 NOTE — Telephone Encounter (Signed)
New message     Request for surgical clearance:  What type of surgery is being performed?  Cervical ESI When is this surgery scheduled? Pending clearance Are there any medications that need to be held prior to surgery and how long?  Off plavix 5 days prior 1. Name of physician performing surgery? Not sure yet  2. What is your office phone and fax number?  Fax [phone]----need something in writing stating pt can stop plavix 5 days prior

## 2016-01-18 NOTE — Telephone Encounter (Signed)
He may hold Plavix for 5 days for spinal injection.  Peter Martinique MD, [facility]

## 2016-01-18 NOTE — Telephone Encounter (Signed)
Faxed note with Dr.Jordan's recommendations to [facility] at fax # [phone].

## 2016-01-18 NOTE — Telephone Encounter (Signed)
Returned call to [place] with [facility] needs clearance to hold plavix 5 days before steroid injection.Message sent to [place] for advice.

## 2016-01-22 ENCOUNTER — Telehealth: Payer: Self-pay

## 2016-01-22 NOTE — Telephone Encounter (Signed)
Received clearance from [facility].Dr.Jordan advised ok to hold plavix 5 days prior to epidural injection.Form faxed back to fax # [phone].

## 2016-01-27 DIAGNOSIS — E663 Overweight: Secondary | ICD-10-CM | POA: Diagnosis not present

## 2016-01-27 DIAGNOSIS — M5136 Other intervertebral disc degeneration, lumbar region: Secondary | ICD-10-CM | POA: Diagnosis not present

## 2016-01-27 DIAGNOSIS — F329 Major depressive disorder, single episode, unspecified: Secondary | ICD-10-CM | POA: Diagnosis not present

## 2016-01-27 DIAGNOSIS — Z6828 Body mass index (BMI) 28.0-28.9, adult: Secondary | ICD-10-CM | POA: Diagnosis not present

## 2016-01-27 DIAGNOSIS — E1149 Type 2 diabetes mellitus with other diabetic neurological complication: Secondary | ICD-10-CM | POA: Diagnosis not present

## 2016-02-08 DIAGNOSIS — Z6828 Body mass index (BMI) 28.0-28.9, adult: Secondary | ICD-10-CM | POA: Diagnosis not present

## 2016-02-08 DIAGNOSIS — M542 Cervicalgia: Secondary | ICD-10-CM | POA: Diagnosis not present

## 2016-02-08 DIAGNOSIS — I10 Essential (primary) hypertension: Secondary | ICD-10-CM | POA: Diagnosis not present

## 2016-03-08 DIAGNOSIS — M542 Cervicalgia: Secondary | ICD-10-CM | POA: Diagnosis not present

## 2016-03-19 ENCOUNTER — Other Ambulatory Visit: Payer: Self-pay | Admitting: Cardiology

## 2016-03-23 ENCOUNTER — Ambulatory Visit (INDEPENDENT_AMBULATORY_CARE_PROVIDER_SITE_OTHER): Payer: Medicare Other | Admitting: *Deleted

## 2016-03-23 DIAGNOSIS — I255 Ischemic cardiomyopathy: Secondary | ICD-10-CM

## 2016-03-24 NOTE — Progress Notes (Signed)
Remote ICD transmission.   

## 2016-04-29 ENCOUNTER — Encounter: Payer: Self-pay | Admitting: Cardiology

## 2016-05-03 LAB — CUP PACEART REMOTE DEVICE CHECK
Brady Statistic RV Percent Paced: 0 %
Date Time Interrogation Session: 20170503075200
HIGH POWER IMPEDANCE MEASURED VALUE: 58 Ohm
Implantable Lead Implant Date: 20050923
Implantable Lead Model: 158
Implantable Lead Serial Number: 154635
Lead Channel Impedance Value: 502 Ohm
Lead Channel Pacing Threshold Amplitude: 0.9 V
Lead Channel Pacing Threshold Pulse Width: 0.4 ms
MDC IDC LEAD LOCATION: 753860
MDC IDC MSMT BATTERY REMAINING LONGEVITY: 114 mo
MDC IDC MSMT BATTERY REMAINING PERCENTAGE: 100 %
MDC IDC PG SERIAL: 118170
MDC IDC SET LEADCHNL RV PACING AMPLITUDE: 2.4 V
MDC IDC SET LEADCHNL RV PACING PULSEWIDTH: 0.4 ms
MDC IDC SET LEADCHNL RV SENSING SENSITIVITY: 0.4 mV

## 2016-05-12 DIAGNOSIS — M5136 Other intervertebral disc degeneration, lumbar region: Secondary | ICD-10-CM | POA: Diagnosis not present

## 2016-05-12 DIAGNOSIS — E7800 Pure hypercholesterolemia, unspecified: Secondary | ICD-10-CM | POA: Diagnosis not present

## 2016-05-12 DIAGNOSIS — E1149 Type 2 diabetes mellitus with other diabetic neurological complication: Secondary | ICD-10-CM | POA: Diagnosis not present

## 2016-05-12 DIAGNOSIS — Z6827 Body mass index (BMI) 27.0-27.9, adult: Secondary | ICD-10-CM | POA: Diagnosis not present

## 2016-05-12 DIAGNOSIS — R296 Repeated falls: Secondary | ICD-10-CM | POA: Diagnosis not present

## 2016-05-12 DIAGNOSIS — I255 Ischemic cardiomyopathy: Secondary | ICD-10-CM | POA: Diagnosis not present

## 2016-05-12 DIAGNOSIS — Z79899 Other long term (current) drug therapy: Secondary | ICD-10-CM | POA: Diagnosis not present

## 2016-05-13 ENCOUNTER — Encounter: Payer: Self-pay | Admitting: Cardiology

## 2016-05-13 ENCOUNTER — Telehealth (HOSPITAL_COMMUNITY): Payer: Self-pay | Admitting: Cardiology

## 2016-05-13 NOTE — Telephone Encounter (Signed)
Returned call to patient's wife.She stated husband needs appointment with Dr.Jordan.Stated he has been more sob,falling more.Stated she was told Dr.Jordan's schedule was full and no appointments until Nov.Dr.Jordan has a cancellation on Mon 6/26.Appointment scheduled 05/16/16 at 10:45 am.

## 2016-05-13 NOTE — Telephone Encounter (Signed)
New Message   Pt daughter call requesting to speak with RN about scheduling the pt for a sooner appt.pt daughter requested pt only wanted to see provider. Pt daughter says she would like to ask the RN about pt having surgery. Please call back to discuss

## 2016-05-16 ENCOUNTER — Encounter: Payer: Self-pay | Admitting: Cardiology

## 2016-05-16 ENCOUNTER — Ambulatory Visit (INDEPENDENT_AMBULATORY_CARE_PROVIDER_SITE_OTHER): Payer: Medicare Other | Admitting: Cardiology

## 2016-05-16 ENCOUNTER — Other Ambulatory Visit: Payer: Self-pay

## 2016-05-16 VITALS — BP 144/66 | HR 81 | Ht 68.0 in | Wt 177.8 lb

## 2016-05-16 DIAGNOSIS — I472 Ventricular tachycardia: Secondary | ICD-10-CM

## 2016-05-16 DIAGNOSIS — I5022 Chronic systolic (congestive) heart failure: Secondary | ICD-10-CM

## 2016-05-16 DIAGNOSIS — I255 Ischemic cardiomyopathy: Secondary | ICD-10-CM | POA: Diagnosis not present

## 2016-05-16 DIAGNOSIS — Z951 Presence of aortocoronary bypass graft: Secondary | ICD-10-CM | POA: Diagnosis not present

## 2016-05-16 DIAGNOSIS — I4729 Other ventricular tachycardia: Secondary | ICD-10-CM

## 2016-05-16 MED ORDER — BISOPROLOL FUMARATE 5 MG PO TABS: 2.5000 mg | ORAL_TABLET | Freq: Every day | ORAL | Status: DC

## 2016-05-16 MED ORDER — LISINOPRIL 2.5 MG PO TABS: 2.5000 mg | ORAL_TABLET | Freq: Every day | ORAL | Status: DC

## 2016-05-16 MED ORDER — SPIRONOLACTONE 25 MG PO TABS: 12.5000 mg | ORAL_TABLET | Freq: Every day | ORAL | Status: DC

## 2016-05-16 MED ORDER — PRAVASTATIN SODIUM 40 MG PO TABS: 40.0000 mg | ORAL_TABLET | Freq: Every day | ORAL | Status: DC

## 2016-05-16 MED ORDER — CLOPIDOGREL BISULFATE 75 MG PO TABS: 75.0000 mg | ORAL_TABLET | Freq: Every day | ORAL | Status: DC

## 2016-05-16 MED ORDER — FUROSEMIDE 40 MG PO TABS: 40.0000 mg | ORAL_TABLET | Freq: Every day | ORAL | Status: DC

## 2016-05-16 NOTE — Patient Instructions (Signed)
Reduce lasix to 40 mg daily  We will check with [facility] about your options for oxygen  I will see you in 4 months

## 2016-05-16 NOTE — Progress Notes (Addendum)
Cardiology Office Note   Date:  05/16/2016   ID:  Victor [name], DOB 1936-01-16, MRN [id number]  PCP:  Leonides Sake, MD  Cardiologist:  Dr. Arieh Bogue Martinique   Electrophysiologist:  Dr. Cristopher Peru    Chief Complaint  Patient presents with  . Follow-up    SOB; during any activity. DIZZINESS; randomly. CRAMPING; legs      History of Present Illness: Victor [name] is a 80 y.o. male is seen for follow up CAD and CHF. He has a hx of CAD, status post CABG in 1990 with known occlusion of both vein grafts, PCI to the SVG-OM3 in 2014 and attempted CTO PCI of the RCA 09/2014. Chronic combined systolic and diastolic CHF, ischemic cardiomyopathy with history of VT in 2012, status post ICD, diabetes, CKD, HTN, HL, PAD, carotid stenosis.   Admitted 02/2015 with acute CHF exacerbation. He had new EKG changes and underwent LHC with unsuccessful repeat attempt at PCI of CTO RCA. Medical therapy was continued.  Admitted 8/7-06/30/15 with acute on chronic combined systolic and diastolic CHF. Patient has a history of orthostatic hypotension.   Follow-up echo demonstrated no significant change with an EF of 35-40%.   On follow up today he states he has not been doing well. Still SOB but no edema or weight gain. In fact he has lost 13 lbs since September and 6 lbs since February when his lasix was reduced. He is on continual oxygen and when he doesn't wear it he becomes more sleepy and fatigued. He notes increased symptoms of lightheadedness and falls. He has been switched around on his diabetic meds and is now on Levemir. He has severe cervical diseases and chronic neck pain. Received epidural injections earlier this year without improvement. Uses Decadron prn when pain becomes severe.    Studies/Reports Reviewed Today:  Echo 06/29/15 Mild LVH, EF 35-40%, inferior HK, mild MR, mild LAE, PASP 42 mmHg Impressions: - Compared to the prior study, there has been no significantinterval change.  LHC  03/20/15 Left mainstem: Normal  LAD: Proximal 30-40%, D1 ostial 80%.  LCx: The LCx is occluded distally. The first and second OM branches are occluded.  RCA: Proximal severely calcified 90%, mid occluded with what appeared to be a micro channel through the lesion with filling distally as well as some left to right collaterals.  PCI Data: Unable to cross with a wire.  Final Conclusions:  1. Severe 2 vessel obstructive CAD with chronic occlusion of OMs and RCA 2. Known occlusion of SVGs from prior caths. 3. Normal right heart and LV filling pressures. 4. Unsuccessful attempt at PCI of the RCA due to inability to cross occlusion in the mid vessel after the RV marginal branch. Recommendations: Continue medical therapy. Will review with Dr. Irish Lack to see if there are any further options for CTO PCI. If stable patient can be DC this weekend.   Myoview 02/05/14 High risk stress nuclear study with a large, severe intensity, partially reversible inferior lateral defect consistent with prior infarct and mild peri-infarct ischemia; possible transient ischemic dilatation of LV cavity.  LV Ejection Fraction: 25%. LV Wall Motion: Akinesis of the inferior lateral wall.  Last device check in March showed NSVT- longest 9 beats.  Past Medical History  Diagnosis Date  . Occlusion and stenosis of carotid artery without mention of cerebral infarction   . Raynaud's syndrome   . Paroxysmal ventricular tachycardia ([place])   . Peripheral vascular disease ([place])   . Cardiomyopathy, ischemic  a. s/p ICD placement  . Automatic implantable cardioverter-defibrillator in situ   . HLD (hyperlipidemia)   . GERD (gastroesophageal reflux disease)   . Chronic lower back pain   . Coronary artery disease     a. s/p CABG 1990. b. multiple PCIs since that time including CTO angioplasty (without stenting) PCI of RCA 09/2014, with last cath 02/2015 done for worsening CHF with unsuccessful attempt at PCI of the RCA due to  inability to cross occlusion - medical therapy advised at that time.  . Hypertension   . Type II diabetes mellitus ([place])   . Arthritis   . TIA (transient ischemic attack)     a. s/p LHC on 10/01/14   . Chronic combined systolic and diastolic CHF (congestive heart failure) ([place])   . Stroke [facility])     a. CT head 06/2015: Small old lacunar infarct LEFT basal ganglia.  . Orthostatic hypotension   . CKD (chronic kidney disease), stage III   . Anemia   . Thrombocytopenia [facility])     Past Surgical History  Procedure Laterality Date  . Anal fistulectomy  2000's  . Cataract extraction, bilateral Bilateral ~ 2009  . Cardiac defibrillator placement  02/1989-12/2011    "I've had a total of 6 or 7 defibrillators put in" (09/30/2014)  . Finger fracture surgery Left ~ 2000    "pointer"  . Coronary artery bypass graft  01/1989    "CABG X3"   . Fracture surgery    . Coronary angioplasty with stent placement  04/2013    "1"  . Cardiac catheterization  07/2014  . Coronary angioplasty  10/01/14    PTVA to CTO with restoration of TIMI 3  . Implantable cardioverter defibrillator (icd) generator change Left 10/05/2011    Procedure: ICD GENERATOR CHANGE;  Surgeon: Evans Lance, MD;  Location:  [facility] CATH LAB;  Service: Cardiovascular;  Laterality: Left;  . Left and right heart catheterization with coronary/graft angiogram N/A 04/26/2013    Procedure: LEFT AND RIGHT HEART CATHETERIZATION WITH Beatrix Fetters;  Surgeon: Sherren Mocha, MD;  Location: [facility] CATH LAB;  Service: Cardiovascular;  Laterality: N/A;  . Percutaneous stent intervention  04/26/2013    Procedure: PERCUTANEOUS STENT INTERVENTION;  Surgeon: Sherren Mocha, MD;  Location: [facility] CATH LAB;  Service: Cardiovascular;;  . Left heart catheterization with coronary angiogram N/A 08/01/2014    Procedure: LEFT HEART CATHETERIZATION WITH CORONARY ANGIOGRAM;  Surgeon: Sinclair Grooms, MD;  Location: [facility] CATH LAB;  Service: Cardiovascular;  Laterality: N/A;    . Right heart catheterization  10/01/2014    Procedure: RIGHT HEART CATH;  Surgeon: Trig Mcbryar M Martinique, MD;  Location: [facility] CATH LAB;  Service: Cardiovascular;;  . Cardiac catheterization  10/01/2014    Procedure: CORONARY BALLOON ANGIOPLASTY;  Surgeon: Delina Kruczek M Martinique, MD;  Location: [facility] CATH LAB;  Service: Cardiovascular;;  . Left heart catheterization with coronary angiogram N/A 03/20/2015    Procedure: LEFT HEART CATHETERIZATION WITH CORONARY ANGIOGRAM;  Surgeon: Dequan Kindred M Martinique, MD;  Location: [facility] CATH LAB;  Service: Cardiovascular;  Laterality: N/A;     Current Outpatient Prescriptions  Medication Sig Dispense Refill  . aspirin (ASPIR-81) 81 MG EC tablet Take 81 mg by mouth at bedtime.     . bisoprolol (ZEBETA) 5 MG tablet Take 0.5 tablets (2.5 mg total) by mouth daily. 45 tablet 3  . clopidogrel (PLAVIX) 75 MG tablet Take 1 tablet (75 mg total) by mouth daily. 90 tablet 3  . dexamethasone (DECADRON) 4 MG tablet Take 4  mg by mouth daily as needed (back pain).   0  . DULoxetine (CYMBALTA) 30 MG capsule Take one capsule by mouth daily for 1 week then increase to 2 capsules daily.    . furosemide (LASIX) 40 MG tablet Take 40 mg by mouth daily. 90 tablet 3  . HYDROcodone-acetaminophen (NORCO) 10-325 MG per tablet Take 1 tablet by mouth every 8 (eight) hours as needed for moderate pain.   0  . LEVEMIR FLEXTOUCH 100 UNIT/ML Pen INJECT 30 UNITS UNDER SKIN EACH MORNING  1  . lisinopril (PRINIVIL,ZESTRIL) 2.5 MG tablet Take 1 tablet (2.5 mg total) by mouth daily. 90 tablet 3  . LYRICA 150 MG capsule Take 150 mg by mouth daily.   1  . metFORMIN (GLUCOPHAGE) 1000 MG tablet Take 1,000 mg by mouth 2 (two) times daily with a meal.    . mupirocin ointment (BACTROBAN) 2 % Apply 1 application topically 2 (two) times daily.  3  . nitroGLYCERIN (NITROSTAT) 0.4 MG SL tablet Place 0.4 mg under the tongue every 5 (five) minutes as needed for chest pain.    . ONE TOUCH ULTRA TEST test strip CHECK SUGAR AS DIRECTED 3  TIMES DAILY  5  . pravastatin (PRAVACHOL) 40 MG tablet Take 1 tablet (40 mg total) by mouth at bedtime. 90 tablet 3  . SANTYL ointment Apply 1 application topically every other day.  2  . spironolactone (ALDACTONE) 25 MG tablet Take 0.5 tablets (12.5 mg total) by mouth daily. 90 tablet 3  . vitamin B-12 (CYANOCOBALAMIN) 1000 MCG tablet Take 1,000 mcg by mouth daily.     No current facility-administered medications for this visit.    Allergies:   Coreg; Lovastatin; and Ativan    Social History:  The patient  reports that he has quit smoking. His smoking use included Pipe. He has never used smokeless tobacco. He reports that he drinks alcohol. He reports that he does not use illicit drugs.   Family History:  The patient's family history includes Heart attack in his father; Heart failure (age of onset: 77) in his mother; Leukemia in his sister; Stomach cancer in his maternal grandfather.    ROS:   Please see the history of present illness.   Review of Systems  Constitution: Positive for malaise/fatigue and weight loss. Negative for decreased appetite.  Cardiovascular: Positive for dyspnea on exertion and near-syncope. Negative for leg swelling.  Respiratory: Positive for shortness of breath. Negative for wheezing.   Hematologic/Lymphatic: Negative for bleeding problem. Does not bruise/bleed easily.  Musculoskeletal: Positive for back pain, muscle weakness and neck pain.  Gastrointestinal: Negative for bloating.  Neurological: Positive for dizziness. Negative for loss of balance.  All other systems reviewed and are negative.     PHYSICAL EXAM: VS:  BP 144/66 mmHg  Pulse 81  Ht 5\' 8"  (1.727 m)  Wt 177 lb 12.8 oz (80.65 kg)  BMI 27.04 kg/m2    Wt Readings from Last 3 Encounters:  05/16/16 177 lb 12.8 oz (80.65 kg)  01/15/16 183 lb (83.008 kg)  12/23/15 185 lb (83.915 kg)     GEN: Well nourished, chronically ill appearing, in no acute distress HEENT: normal Neck: no JVD,   no  masses Cardiac:  Normal S1/S2, RRR; no murmur ,  no rubs or gallops, No edema Respiratory:  Decreased breath sounds bilaterally, no wheezing, rhonchi or rales. GI: soft, nontender, nondistended, + BS MS: no deformity or atrophy Skin: warm and dry  Neuro:  CNs II-XII intact, Strength  and sensation are intact Psych: Normal affect   EKG:  EKG is ordered today. NSR with first degree AV block. Old inferior infarct. Low voltage. I have personally reviewed and interpreted this study.    Recent Labs: 06/28/2015: ALT 18; Magnesium 1.9; TSH 1.001 07/07/2015: Pro B Natriuretic peptide (BNP) 473.0* 12/23/2015: Brain Natriuretic Peptide 227.8*; BUN 44*; Creat 1.50*; Hemoglobin 11.7*; Platelets 188; Potassium 4.5; Sodium 135    Lipid Panel    Component Value Date/Time   CHOL 127 06/29/2015 0508   TRIG 111 06/29/2015 0508   HDL 36* 06/29/2015 0508   CHOLHDL 3.5 06/29/2015 0508   VLDL 22 06/29/2015 0508   LDLCALC 69 06/29/2015 0508      ASSESSMENT AND PLAN:  1. Chronic combined systolic and diastolic CHF:  Overall, appears euvolemic today. I am concerned with his weight loss that he may be over diuresed. Will reduce lasix to 40 mg daily. Labs just done by primary care- will request results.  Titration of other CHF medications limited by  orthostatic hypotension. He is using oxygen continually at home and has derived benefit from this. He does use portable oxygen in the home.  Recommend continued use of oxygen and compression hose.  2. Cardiomyopathy, ischemic:  Last EF 35-40% by echocardiogram. Continue beta blocker, ACE inhibitor.   3. Coronary artery disease involving native coronary artery of native heart without angina pectoris:   He had a  heart catheterization demonstrating severe 2 vessel CAD with chronic occlusion of the obtuse marginal and RCA with unsuccessful attempt at CTO  PCI of the RCA. Continue aspirin, beta blocker, Plavix, statin. He is not a candidate for further  revascularization.  4. Essential hypertension:  Controlled.  5. Orthostatic hypotension:  Continue  compression stockings to help with his orthostatic hypotension. Reduce lasix as noted above.   6. Hyperlipidemia:    continue statin.  7. ICD- BS Nov 2012 for VT: NSVT seen on follow up ICD check but no sustained episodes.  8. Type 2 diabetes mellitus with other circulatory complications:  Follow-up with primary care.  9. CKD (chronic kidney disease), class 3.    10. Back pain/cervical pain. He is a poor candidate for surgery with multiple co-morbidities and general poor health. Recommend conservative therapy.  11. Depression. Now on Cymbalta.     Medication Changes: Current medicines are reviewed at length with the patient today.  Concerns regarding medicines are as outlined above.  The following changes have been made:  See above  Labs/ tests ordered today include:   Orders Placed This Encounter  Procedures  . EKG 12-Lead     Disposition:    FU with with Dr. Martinique in 4 months.     Signed, Brodan Grewell Martinique MD, [facility]    05/16/2016 1:10 PM

## 2016-05-18 ENCOUNTER — Telehealth: Payer: Self-pay | Admitting: Cardiology

## 2016-05-18 NOTE — Telephone Encounter (Signed)
Spoke to patient's wife she stated she received a card in mail today stating in order for medicare to continue paying for home O2 Dr.Jordan needed to say in office note #1 Pt is continuing to use home O2  #2 Pt is benefiting from home O2  #3 Pt uses portable O2 in home.Message sent to [place].

## 2016-05-18 NOTE — Telephone Encounter (Signed)
New Message  Pt wife calling to speak w/ RN about most recent OV w/ Dr Martinique. Please call back and discuss.

## 2016-05-18 NOTE — Telephone Encounter (Signed)
Not sure what the question is   Peter Martinique MD, [facility]

## 2016-05-19 ENCOUNTER — Telehealth: Payer: Self-pay

## 2016-05-19 NOTE — Telephone Encounter (Signed)
Spoke to [place] at [facility].Patient needs lighter weight portable O2 tank.She advised fax order and RT will go out and evaluate pt.She stated they have lighter weight portable O2 tanks.Order faxed to [facility] for evaluation for lighter weight portable O2 tank.Faxed to [phone].

## 2016-05-19 NOTE — Telephone Encounter (Signed)
Received surgical clearance from Dr.Kyle Cabbell.Dr.Jordan advised patient not a candidate for surgery.Form faxed back to fax # [phone].

## 2016-05-25 ENCOUNTER — Other Ambulatory Visit: Payer: Self-pay | Admitting: Licensed Clinical Social Worker

## 2016-05-25 ENCOUNTER — Other Ambulatory Visit: Payer: Self-pay | Admitting: Family Medicine

## 2016-05-25 DIAGNOSIS — R918 Other nonspecific abnormal finding of lung field: Secondary | ICD-10-CM

## 2016-06-03 ENCOUNTER — Ambulatory Visit
Admission: RE | Admit: 2016-06-03 | Discharge: 2016-06-03 | Disposition: A | Discharge status: Home / Self Care | Payer: Medicare Other | Source: Ambulatory Visit | Attending: Family Medicine | Admitting: Family Medicine

## 2016-06-03 DIAGNOSIS — R918 Other nonspecific abnormal finding of lung field: Secondary | ICD-10-CM | POA: Diagnosis not present

## 2016-06-23 ENCOUNTER — Ambulatory Visit (INDEPENDENT_AMBULATORY_CARE_PROVIDER_SITE_OTHER): Payer: Medicare Other | Admitting: *Deleted

## 2016-06-23 DIAGNOSIS — I255 Ischemic cardiomyopathy: Secondary | ICD-10-CM | POA: Diagnosis not present

## 2016-06-23 DIAGNOSIS — I4729 Other ventricular tachycardia: Secondary | ICD-10-CM

## 2016-06-23 DIAGNOSIS — I472 Ventricular tachycardia: Secondary | ICD-10-CM

## 2016-06-24 NOTE — Progress Notes (Signed)
Remote ICD transmission.   

## 2016-06-29 ENCOUNTER — Encounter: Payer: Self-pay | Admitting: Cardiology

## 2016-07-12 LAB — CUP PACEART REMOTE DEVICE CHECK
HighPow Impedance: 57 Ohm
Implantable Lead Location: 753860
Lead Channel Impedance Value: 472 Ohm
Lead Channel Pacing Threshold Pulse Width: 0.4 ms
Lead Channel Setting Pacing Amplitude: 2.4 V
Lead Channel Setting Sensing Sensitivity: 0.4 mV
MDC IDC LEAD IMPLANT DT: 20050923
MDC IDC LEAD MODEL: 158
MDC IDC LEAD SERIAL: 154635
MDC IDC MSMT BATTERY REMAINING LONGEVITY: 114 mo
MDC IDC MSMT BATTERY REMAINING PERCENTAGE: 100 %
MDC IDC MSMT LEADCHNL RV PACING THRESHOLD AMPLITUDE: 0.9 V
MDC IDC SESS DTM: 20170803071300
MDC IDC SET LEADCHNL RV PACING PULSEWIDTH: 0.4 ms
MDC IDC STAT BRADY RV PERCENT PACED: 0 %
Pulse Gen Serial Number: 118170

## 2016-07-15 ENCOUNTER — Encounter: Payer: Self-pay | Admitting: Cardiology

## 2016-08-10 DIAGNOSIS — Z1389 Encounter for screening for other disorder: Secondary | ICD-10-CM | POA: Diagnosis not present

## 2016-08-10 DIAGNOSIS — Z9181 History of falling: Secondary | ICD-10-CM | POA: Diagnosis not present

## 2016-08-10 DIAGNOSIS — Z6827 Body mass index (BMI) 27.0-27.9, adult: Secondary | ICD-10-CM | POA: Diagnosis not present

## 2016-08-10 DIAGNOSIS — J9611 Chronic respiratory failure with hypoxia: Secondary | ICD-10-CM | POA: Diagnosis not present

## 2016-08-10 DIAGNOSIS — G4700 Insomnia, unspecified: Secondary | ICD-10-CM | POA: Diagnosis not present

## 2016-08-10 DIAGNOSIS — M5030 Other cervical disc degeneration, unspecified cervical region: Secondary | ICD-10-CM | POA: Diagnosis not present

## 2016-08-10 DIAGNOSIS — E1122 Type 2 diabetes mellitus with diabetic chronic kidney disease: Secondary | ICD-10-CM | POA: Diagnosis not present

## 2016-08-10 DIAGNOSIS — E1149 Type 2 diabetes mellitus with other diabetic neurological complication: Secondary | ICD-10-CM | POA: Diagnosis not present

## 2016-08-10 DIAGNOSIS — E875 Hyperkalemia: Secondary | ICD-10-CM | POA: Diagnosis not present

## 2016-08-18 IMAGING — CT CT L SPINE W/O CM
3 of 11 series · 10 of 33 positions shown, 12 images · non-contrast
Comparison: CT lumbar spine 07/26/2012.

CLINICAL DATA: Chronic low back pain with bilateral leg pain and
weakness, worsened over the past several weeks. Recurrent falls.

EXAM:
CT LUMBAR SPINE WITHOUT CONTRAST
TECHNIQUE: Multidetector CT imaging of the lumbar spine was performed without
intravenous contrast administration. Multiplanar CT image
reconstructions were also generated.

[Series 5: l spine detail · axial · 0.27mm/px · z∈[-174,-91]mm · 2 of 101 slices shown, 3 images]
[im 34/101  soft-tissue]
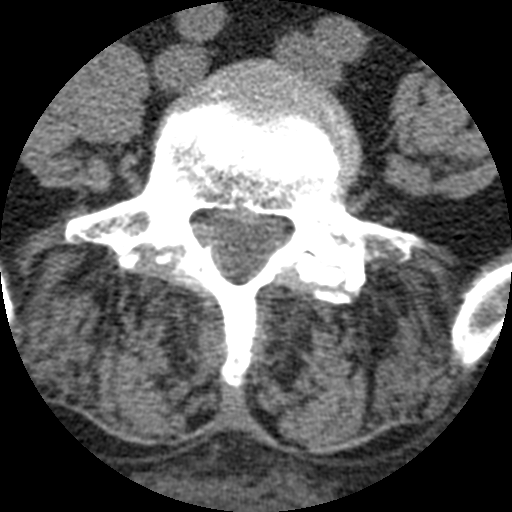
[im 34/101  bone]
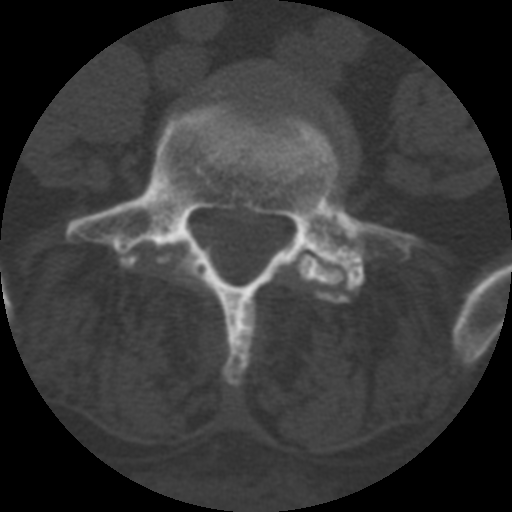
[im 67/101  bone]
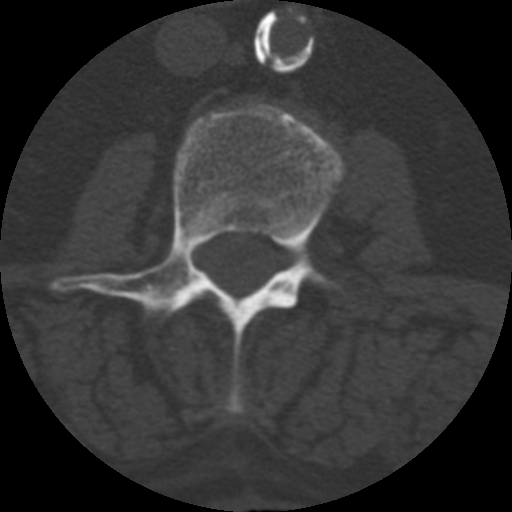

[Series 201: cor lower · coronal · 0.50mm/px · 3 of 60 slices shown]
[im 4/60  bone]
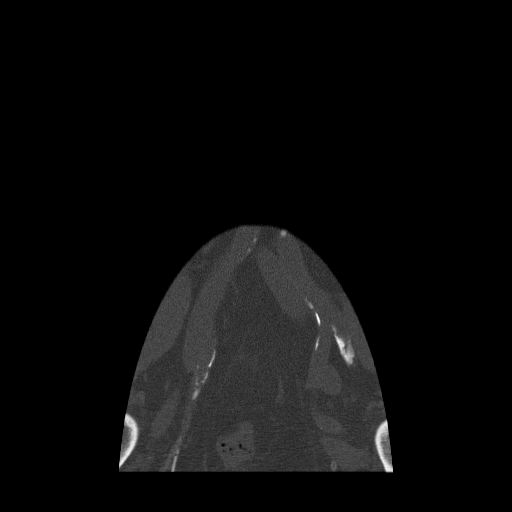
[im 30/60  bone]
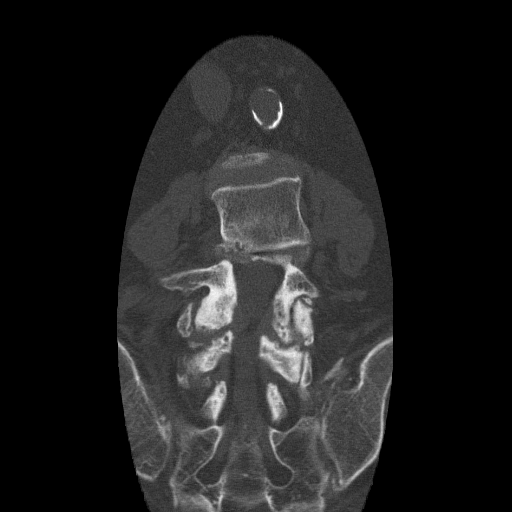
[im 57/60  bone]
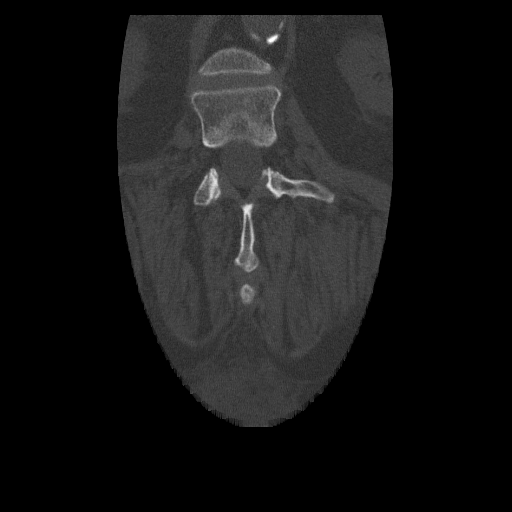

[Series 202: sag · sagittal · 0.50mm/px · 5 of 63 slices shown, 6 images]
[im 21/63  bone]
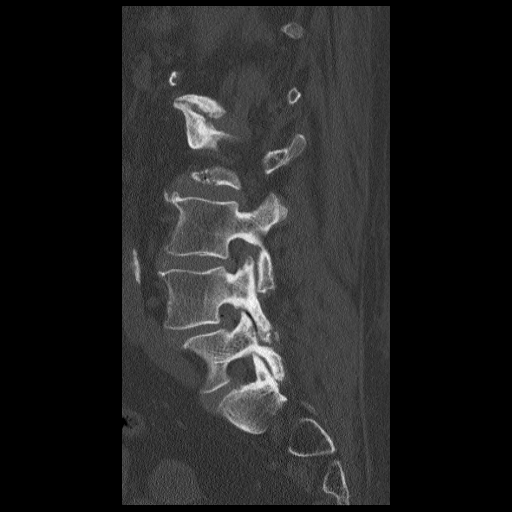
[im 26/63  bone]
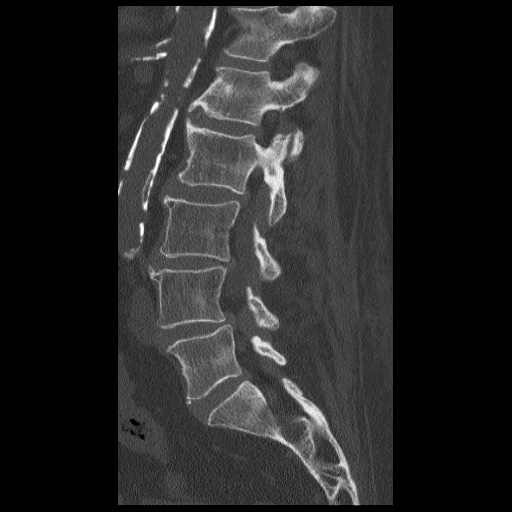
[im 32/63  soft-tissue]
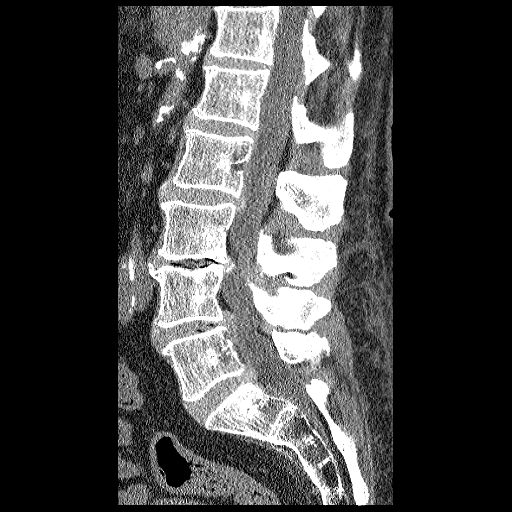
[im 32/63  bone]
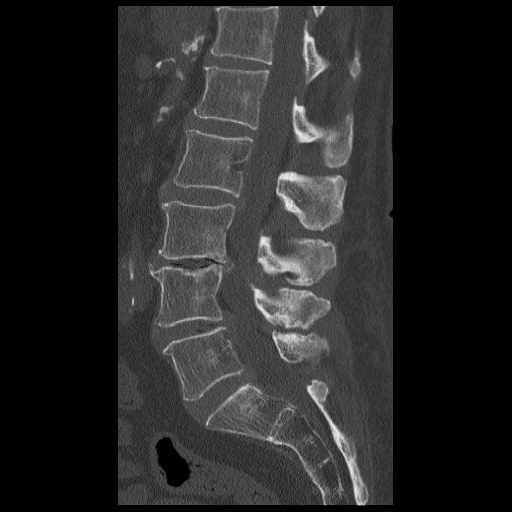
[im 37/63  bone]
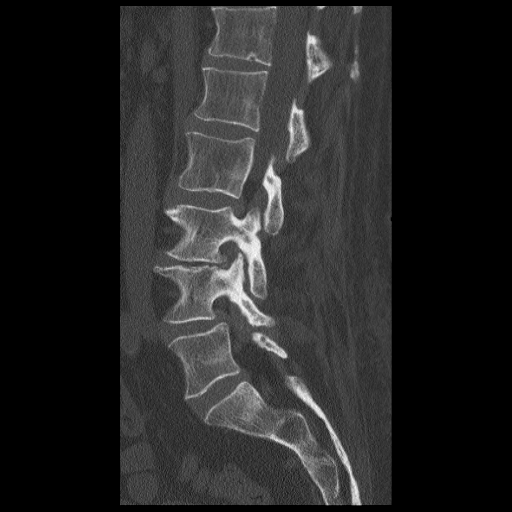
[im 42/63  bone]
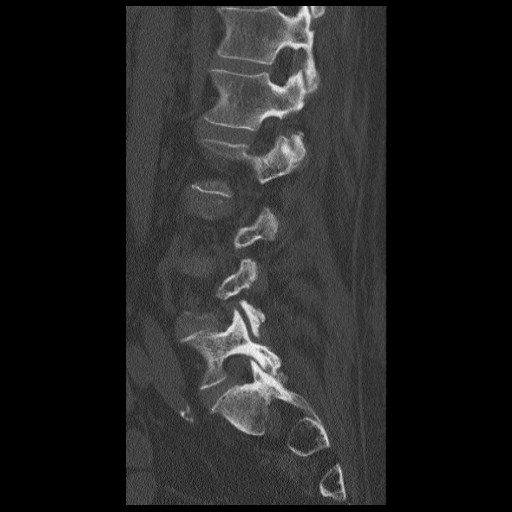

[10 of 33 positions shown; findings below may reference images not displayed]

FINDINGS: There is convex left lumbar scoliosis. Vertebral body height and
alignment are maintained. Imaged intra-abdominal contents
demonstrate extensive aortoiliac atherosclerosis without aneurysm.

T12-L1:  Negative.

L1-2:  Negative.

L2-3: A disc bulge eccentric to the right is not changed. Mild
ligamentum flavum thickening is seen. There is mild central canal
and right foraminal narrowing. The left foramen is open.

L3-4: Shallow disc bulge, ligamentum flavum thickening and facet
arthropathy are identified. Moderate to moderately severe foraminal
narrowing appears worse on the right. The central canal is mildly
narrowed. Degenerative disease shows mild progression since the
prior study.

L4-5: Broad-based disc bulge, facet arthropathy and ligamentum
flavum thickening are identified. Moderately severe facet
degenerative disease is identified. There is moderate central canal
stenosis. Moderate to moderately severe foraminal narrowing, worse
on the left is identified. The appearance is unchanged.

L5-S1:  Negative.
IMPRESSION: Spondylosis appearing worst at L4-5 where there is moderate central
canal narrowing and moderately severe to severe foraminal narrowing,
worse on the left.

Some progression of spondylosis at L3-4 where there is moderate to
moderately severe foraminal narrowing, worse on the right, which has
progressed since the prior study. Mild central canal stenosis at
this level does not appear changed.

## 2016-09-02 DIAGNOSIS — S60511A Abrasion of right hand, initial encounter: Secondary | ICD-10-CM | POA: Diagnosis not present

## 2016-09-02 DIAGNOSIS — T148XXA Other injury of unspecified body region, initial encounter: Secondary | ICD-10-CM | POA: Diagnosis not present

## 2016-09-02 DIAGNOSIS — S40812A Abrasion of left upper arm, initial encounter: Secondary | ICD-10-CM | POA: Diagnosis not present

## 2016-09-02 DIAGNOSIS — H1132 Conjunctival hemorrhage, left eye: Secondary | ICD-10-CM | POA: Diagnosis not present

## 2016-09-02 DIAGNOSIS — S0990XA Unspecified injury of head, initial encounter: Secondary | ICD-10-CM | POA: Diagnosis not present

## 2016-09-06 DIAGNOSIS — Z6827 Body mass index (BMI) 27.0-27.9, adult: Secondary | ICD-10-CM | POA: Diagnosis not present

## 2016-09-06 DIAGNOSIS — E119 Type 2 diabetes mellitus without complications: Secondary | ICD-10-CM | POA: Diagnosis not present

## 2016-09-06 DIAGNOSIS — S41109A Unspecified open wound of unspecified upper arm, initial encounter: Secondary | ICD-10-CM | POA: Diagnosis not present

## 2016-09-06 DIAGNOSIS — W19XXXA Unspecified fall, initial encounter: Secondary | ICD-10-CM | POA: Diagnosis not present

## 2016-09-06 DIAGNOSIS — Z09 Encounter for follow-up examination after completed treatment for conditions other than malignant neoplasm: Secondary | ICD-10-CM | POA: Diagnosis not present

## 2016-09-06 DIAGNOSIS — Z23 Encounter for immunization: Secondary | ICD-10-CM | POA: Diagnosis not present

## 2016-09-09 DIAGNOSIS — Z9981 Dependence on supplemental oxygen: Secondary | ICD-10-CM | POA: Diagnosis not present

## 2016-09-09 DIAGNOSIS — E1122 Type 2 diabetes mellitus with diabetic chronic kidney disease: Secondary | ICD-10-CM | POA: Diagnosis not present

## 2016-09-09 DIAGNOSIS — J9611 Chronic respiratory failure with hypoxia: Secondary | ICD-10-CM | POA: Diagnosis not present

## 2016-09-09 DIAGNOSIS — S50911D Unspecified superficial injury of right forearm, subsequent encounter: Secondary | ICD-10-CM | POA: Diagnosis not present

## 2016-09-09 DIAGNOSIS — I2510 Atherosclerotic heart disease of native coronary artery without angina pectoris: Secondary | ICD-10-CM | POA: Diagnosis not present

## 2016-09-09 DIAGNOSIS — F329 Major depressive disorder, single episode, unspecified: Secondary | ICD-10-CM | POA: Diagnosis not present

## 2016-09-09 DIAGNOSIS — Z7982 Long term (current) use of aspirin: Secondary | ICD-10-CM | POA: Diagnosis not present

## 2016-09-09 DIAGNOSIS — N183 Chronic kidney disease, stage 3 (moderate): Secondary | ICD-10-CM | POA: Diagnosis not present

## 2016-09-09 DIAGNOSIS — R296 Repeated falls: Secondary | ICD-10-CM | POA: Diagnosis not present

## 2016-09-09 DIAGNOSIS — I10 Essential (primary) hypertension: Secondary | ICD-10-CM | POA: Diagnosis not present

## 2016-09-09 DIAGNOSIS — M5030 Other cervical disc degeneration, unspecified cervical region: Secondary | ICD-10-CM | POA: Diagnosis not present

## 2016-09-09 DIAGNOSIS — Z794 Long term (current) use of insulin: Secondary | ICD-10-CM | POA: Diagnosis not present

## 2016-09-09 DIAGNOSIS — E1140 Type 2 diabetes mellitus with diabetic neuropathy, unspecified: Secondary | ICD-10-CM | POA: Diagnosis not present

## 2016-09-09 DIAGNOSIS — I739 Peripheral vascular disease, unspecified: Secondary | ICD-10-CM | POA: Diagnosis not present

## 2016-09-09 DIAGNOSIS — Z9181 History of falling: Secondary | ICD-10-CM | POA: Diagnosis not present

## 2016-09-09 DIAGNOSIS — S50912D Unspecified superficial injury of left forearm, subsequent encounter: Secondary | ICD-10-CM | POA: Diagnosis not present

## 2016-09-13 DIAGNOSIS — Z6827 Body mass index (BMI) 27.0-27.9, adult: Secondary | ICD-10-CM | POA: Diagnosis not present

## 2016-09-13 DIAGNOSIS — S41109A Unspecified open wound of unspecified upper arm, initial encounter: Secondary | ICD-10-CM | POA: Diagnosis not present

## 2016-09-15 DIAGNOSIS — R296 Repeated falls: Secondary | ICD-10-CM | POA: Diagnosis not present

## 2016-09-15 DIAGNOSIS — S50911D Unspecified superficial injury of right forearm, subsequent encounter: Secondary | ICD-10-CM | POA: Diagnosis not present

## 2016-09-15 DIAGNOSIS — Z9181 History of falling: Secondary | ICD-10-CM | POA: Diagnosis not present

## 2016-09-15 DIAGNOSIS — S50912D Unspecified superficial injury of left forearm, subsequent encounter: Secondary | ICD-10-CM | POA: Diagnosis not present

## 2016-09-15 DIAGNOSIS — E1122 Type 2 diabetes mellitus with diabetic chronic kidney disease: Secondary | ICD-10-CM | POA: Diagnosis not present

## 2016-09-15 DIAGNOSIS — I10 Essential (primary) hypertension: Secondary | ICD-10-CM | POA: Diagnosis not present

## 2016-09-16 ENCOUNTER — Encounter: Payer: Self-pay | Admitting: Cardiology

## 2016-09-16 ENCOUNTER — Ambulatory Visit (INDEPENDENT_AMBULATORY_CARE_PROVIDER_SITE_OTHER): Payer: Medicare Other | Admitting: Cardiology

## 2016-09-16 DIAGNOSIS — I2589 Other forms of chronic ischemic heart disease: Secondary | ICD-10-CM

## 2016-09-16 DIAGNOSIS — I951 Orthostatic hypotension: Secondary | ICD-10-CM | POA: Diagnosis not present

## 2016-09-16 DIAGNOSIS — Z951 Presence of aortocoronary bypass graft: Secondary | ICD-10-CM

## 2016-09-16 DIAGNOSIS — Z95810 Presence of automatic (implantable) cardiac defibrillator: Secondary | ICD-10-CM

## 2016-09-16 DIAGNOSIS — I255 Ischemic cardiomyopathy: Secondary | ICD-10-CM | POA: Diagnosis not present

## 2016-09-16 DIAGNOSIS — Z794 Long term (current) use of insulin: Secondary | ICD-10-CM

## 2016-09-16 DIAGNOSIS — E119 Type 2 diabetes mellitus without complications: Secondary | ICD-10-CM

## 2016-09-16 LAB — CBC
HCT: 31.9 % — ABNORMAL LOW (ref 38.5–50.0)
Hemoglobin: 9.9 g/dL — ABNORMAL LOW (ref 13.2–17.1)
MCH: 26.8 pg — ABNORMAL LOW (ref 27.0–33.0)
MCHC: 31 g/dL — ABNORMAL LOW (ref 32.0–36.0)
MCV: 86.4 fL (ref 80.0–100.0)
MPV: 10.2 fL (ref 7.5–12.5)
Platelets: 222 10*3/uL (ref 140–400)
RBC: 3.69 MIL/uL — ABNORMAL LOW (ref 4.20–5.80)
RDW: 14.6 % (ref 11.0–15.0)
WBC: 9.1 10*3/uL (ref 3.8–10.8)

## 2016-09-16 MED ORDER — OMEPRAZOLE 20 MG PO CPDR: 20.0000 mg | DELAYED_RELEASE_CAPSULE | Freq: Every day | ORAL | 6 refills | Status: DC

## 2016-09-16 NOTE — Assessment & Plan Note (Signed)
Followed by Dr. Taylor 

## 2016-09-16 NOTE — Assessment & Plan Note (Signed)
Followed by PCP

## 2016-09-16 NOTE — Assessment & Plan Note (Signed)
a. s/p CABG 1990 with Occlusion of both VGs.  b. s/p PCI of CTO of RCA 10/05/14

## 2016-09-16 NOTE — Assessment & Plan Note (Signed)
Pt is in the office with complaints of fatigue, falls, and dizziness when up

## 2016-09-16 NOTE — Patient Instructions (Signed)
Medication Instructions:  HOLD Lasix (furosemide), Lisinopril (Prinivil), Aldactone( Spironolactone) UNTIL FURTHER NOTICE START  Omeprazole 20mg  take 1 tab once a day  Labwork: Your physician recommends that you return for lab work in: TODAY BMP, CBC  Testing/Procedures: None     Follow-Up: Your physician recommends that you schedule a follow-up appointment in: NEXT WEEK with Kerin Ransom, PA  Any Other Special Instructions Will Be Listed Below (If Applicable).     If you need a refill on your cardiac medications before your next appointment, please call your pharmacy.

## 2016-09-16 NOTE — Assessment & Plan Note (Signed)
EF 35-40% by echo Aug 2016

## 2016-09-16 NOTE — Progress Notes (Signed)
09/16/2016 Victor [name]   07/31/1936  [id number]  Primary Physician Leonides Sake, MD Primary Cardiologist: Dr Martinique  HPI:  80 y/o Caucasian male with known CAD s/p CABG x 2 in 1990 with multiple PCIs since that time. Last PCI was 10/01/14, he underwent CTO PCI of the RCA.  He also has chronic combined systolic + diastolic CHF with EF of [id number]  By echo Aug 2016. He had VT in 2012 and is S/p BS ICD followed by Dr Lovena Le. He is in the office today with multiple complaints. Since Dr Martinique saw him last in June he has had some falls at home. It sounds like he has had orthostatic dizziness. He actually went to the ED in [place] 09/02/16 after a fall. He tell me no lab work was done (?). He is now getting home PT. He is on chronic O2 now as well-O2 sat in the office today was 98. He complains of dizziness when standing. He denies increased dyspnea or edema but says he has been increasingly weak. He denies any dark stool or GI bleeding history. He denies any tachycardia or palpitations. He does say he has had nausea and this am had dry heaves.    Current Outpatient Prescriptions  Medication Sig Dispense Refill  . aspirin (ASPIR-81) 81 MG EC tablet Take 81 mg by mouth at bedtime.     . bisoprolol (ZEBETA) 5 MG tablet Take 0.5 tablets (2.5 mg total) by mouth daily. 90 tablet 3  . clopidogrel (PLAVIX) 75 MG tablet Take 1 tablet (75 mg total) by mouth daily. 90 tablet 3  . dexamethasone (DECADRON) 4 MG tablet Take 4 mg by mouth daily as needed (back pain).   0  . DULoxetine (CYMBALTA) 30 MG capsule Take 30 mg by mouth daily. Take one capsule by mouth daily for 1 week then increase to 2 capsules daily.     . furosemide (LASIX) 40 MG tablet Take 1 tablet (40 mg total) by mouth daily. 90 tablet 3  . HYDROcodone-acetaminophen (NORCO) 10-325 MG per tablet Take 1 tablet by mouth every 8 (eight) hours as needed for moderate pain.   0  . LEVEMIR FLEXTOUCH 100 UNIT/ML Pen INJECT 30 UNITS UNDER SKIN EACH  MORNING  1  . lisinopril (PRINIVIL,ZESTRIL) 2.5 MG tablet Take 1 tablet (2.5 mg total) by mouth daily. 90 tablet 3  . LYRICA 150 MG capsule Take 150 mg by mouth daily.   1  . metFORMIN (GLUCOPHAGE) 1000 MG tablet Take 1,000 mg by mouth 2 (two) times daily with a meal.    . mupirocin ointment (BACTROBAN) 2 % Apply 1 application topically 2 (two) times daily.  3  . nitroGLYCERIN (NITROSTAT) 0.4 MG SL tablet Place 0.4 mg under the tongue every 5 (five) minutes as needed for chest pain.    . ONE TOUCH ULTRA TEST test strip CHECK SUGAR AS DIRECTED 3 TIMES DAILY  5  . pravastatin (PRAVACHOL) 40 MG tablet Take 1 tablet (40 mg total) by mouth at bedtime. 90 tablet 3  . SANTYL ointment Apply 1 application topically every other day.  2  . spironolactone (ALDACTONE) 25 MG tablet Take 0.5 tablets (12.5 mg total) by mouth daily. 45 tablet 3  . traZODone (DESYREL) 50 MG tablet Take 1 tablet by mouth at bedtime.  1  . vitamin B-12 (CYANOCOBALAMIN) 1000 MCG tablet Take 1,000 mcg by mouth daily.    Marland Kitchen omeprazole (PRILOSEC) 20 MG capsule Take 1 capsule (20 mg total) by mouth  daily. 30 capsule 6   No current facility-administered medications for this visit.     Allergies  Allergen Reactions  . Coreg [Carvedilol] Other (See Comments)    Shortness of breath ,fatigue ,dizzyness   . Lovastatin Other (See Comments)    CK elevation, Myalgias  . Ativan [Lorazepam] Other (See Comments)    Pt. had side effects    Social History   Social History  . Marital status: Married    Spouse name: N/A  . Number of children: N/A  . Years of education: N/A   Occupational History  . Not on file.   Social History Main Topics  . Smoking status: Former Smoker    Years: 3.00    Types: Pipe  . Smokeless tobacco: Never Used     Comment: "quit smoking pipe in early 1970's"  . Alcohol use Yes     Comment: 09/30/2014 "wine w/dinner maybe once/yr"  . Drug use: No  . Sexual activity: No   Other Topics Concern  . Not on  file   Social History Narrative   Complete 2 yrs of college, is w Human resources officer, is a Dentist for [employer], and is married. Has 3 daughters. 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     FM Hx-M died of CHF in her 64's, father died of an MI. Ne early CAD FM Hx  Review of Systems: General: negative for chills, fever, night sweats or weight changes.  Cardiovascular: negative for chest pain, dyspnea on exertion, edema, orthopnea, palpitations, paroxysmal nocturnal dyspnea or shortness of breath Dermatological: negative for rash Respiratory: negative for cough or wheezing Urologic: negative for hematuria Abdominal: negative for diarrhea, bright red blood per rectum, melena, or hematemesis Neurologic: negative for visual changes All other systems reviewed and are otherwise negative except as noted above.    VS 86/42 sitting Rt arm. P-68 )2 98% on 2L  General appearance: alert, cooperative, no distress, moderately obese and pale Neck: no carotid bruit and no JVD Lungs: clear to auscultation bilaterally Heart: regular rate and rhythm Abdomen: obese, non tender Extremities: no edema Skin: Skin color, texture, turgor normal. No rashes or lesions or pale, cool, dry Neurologic: Grossly normal  EKG NSR, 1st degree AVB, inferior Qs  ASSESSMENT AND PLAN:   Orthostatic hypotension Pt is in the office with complaints of fatigue, falls, and dizziness when up  Chronic clinical systolic heart failure He is not in CHF today on exam  Cardiomyopathy, ischemic EF 35-40% by echo Aug 2016  Hx of CABG 1990 a. s/p CABG 1990 with Occlusion of both VGs.  b. s/p PCI of CTO of RCA 10/05/14  ICD- BS Nov 2012 for VT Followed by Dr Lovena Le  Insulin dependent type 2 diabetes mellitus, controlled ([place]) Followed by PCP   PLAN  I offered Mr Victor [name] admission but he declined. I suggested we get a BMP and CBC today, I am on tomorrow and will follow this up. I also suggested we stop his Lisinopril,  Lasix, and Aldactone till further notice. I'll try and get his ED records from [facility].   Kerin Ransom PA-C 09/16/2016 2:25 PM

## 2016-09-16 NOTE — Assessment & Plan Note (Signed)
He is not in CHF today on exam

## 2016-09-17 ENCOUNTER — Telehealth: Payer: Self-pay | Admitting: Cardiology

## 2016-09-17 LAB — BASIC METABOLIC PANEL
BUN: 18 mg/dL (ref 7–25)
CO2: 25 mmol/L (ref 20–31)
Calcium: 9.1 mg/dL (ref 8.6–10.3)
Chloride: 99 mmol/L (ref 98–110)
Creat: 1.28 mg/dL — ABNORMAL HIGH (ref 0.70–1.18)
Glucose, Bld: 223 mg/dL — ABNORMAL HIGH (ref 65–99)
Potassium: 5.2 mmol/L (ref 3.5–5.3)
Sodium: 139 mmol/L (ref 135–146)

## 2016-09-17 NOTE — Telephone Encounter (Signed)
I called and reviewed labs with Ms Ondo. I suggested he resume Lasix tomorrow as he has noticed some LE edema. For now stay off Aldactone and Lisinopril. He has not had dark stools. His "dry heaves have resolved with addition of PPI. I will see him in the office next week in follow up and check a f/u CBC then.   Kerin Ransom PA-C 09/17/2016 9:33 AM

## 2016-09-20 DIAGNOSIS — I10 Essential (primary) hypertension: Secondary | ICD-10-CM | POA: Diagnosis not present

## 2016-09-20 DIAGNOSIS — R296 Repeated falls: Secondary | ICD-10-CM | POA: Diagnosis not present

## 2016-09-20 DIAGNOSIS — S50912D Unspecified superficial injury of left forearm, subsequent encounter: Secondary | ICD-10-CM | POA: Diagnosis not present

## 2016-09-20 DIAGNOSIS — E1122 Type 2 diabetes mellitus with diabetic chronic kidney disease: Secondary | ICD-10-CM | POA: Diagnosis not present

## 2016-09-20 DIAGNOSIS — S50911D Unspecified superficial injury of right forearm, subsequent encounter: Secondary | ICD-10-CM | POA: Diagnosis not present

## 2016-09-20 DIAGNOSIS — Z9181 History of falling: Secondary | ICD-10-CM | POA: Diagnosis not present

## 2016-09-21 ENCOUNTER — Telehealth: Payer: Self-pay | Admitting: Cardiology

## 2016-09-21 NOTE — Telephone Encounter (Signed)
Received records from [facility] as requested by Kerin Ransom, PA for appointment on 09/23/16.  Records given to [employer] (medical records) for Luke's schedule on 09/23/16. lp

## 2016-09-22 ENCOUNTER — Telehealth: Payer: Self-pay | Admitting: Cardiology

## 2016-09-22 ENCOUNTER — Ambulatory Visit (INDEPENDENT_AMBULATORY_CARE_PROVIDER_SITE_OTHER): Payer: Medicare Other | Admitting: *Deleted

## 2016-09-22 DIAGNOSIS — I255 Ischemic cardiomyopathy: Secondary | ICD-10-CM | POA: Diagnosis not present

## 2016-09-22 NOTE — Telephone Encounter (Signed)
Confirmed remote transmission w/ pt wife.   

## 2016-09-22 NOTE — Progress Notes (Signed)
Remote ICD transmission.   

## 2016-09-23 ENCOUNTER — Encounter: Payer: Self-pay | Admitting: Cardiology

## 2016-09-23 ENCOUNTER — Ambulatory Visit: Payer: Medicare Other | Admitting: Cardiology

## 2016-09-23 ENCOUNTER — Ambulatory Visit (INDEPENDENT_AMBULATORY_CARE_PROVIDER_SITE_OTHER): Payer: Medicare Other | Admitting: Cardiology

## 2016-09-23 VITALS — BP 133/64 | HR 74 | Ht 68.0 in | Wt 184.2 lb

## 2016-09-23 DIAGNOSIS — I2589 Other forms of chronic ischemic heart disease: Secondary | ICD-10-CM | POA: Diagnosis not present

## 2016-09-23 DIAGNOSIS — E119 Type 2 diabetes mellitus without complications: Secondary | ICD-10-CM | POA: Diagnosis not present

## 2016-09-23 DIAGNOSIS — I951 Orthostatic hypotension: Secondary | ICD-10-CM | POA: Diagnosis not present

## 2016-09-23 DIAGNOSIS — Z9181 History of falling: Secondary | ICD-10-CM | POA: Diagnosis not present

## 2016-09-23 DIAGNOSIS — I255 Ischemic cardiomyopathy: Secondary | ICD-10-CM | POA: Diagnosis not present

## 2016-09-23 DIAGNOSIS — Z951 Presence of aortocoronary bypass graft: Secondary | ICD-10-CM

## 2016-09-23 DIAGNOSIS — M129 Arthropathy, unspecified: Secondary | ICD-10-CM | POA: Diagnosis not present

## 2016-09-23 DIAGNOSIS — Z794 Long term (current) use of insulin: Secondary | ICD-10-CM

## 2016-09-23 DIAGNOSIS — S50912D Unspecified superficial injury of left forearm, subsequent encounter: Secondary | ICD-10-CM | POA: Diagnosis not present

## 2016-09-23 DIAGNOSIS — I5022 Chronic systolic (congestive) heart failure: Secondary | ICD-10-CM

## 2016-09-23 DIAGNOSIS — I10 Essential (primary) hypertension: Secondary | ICD-10-CM | POA: Diagnosis not present

## 2016-09-23 DIAGNOSIS — S50911D Unspecified superficial injury of right forearm, subsequent encounter: Secondary | ICD-10-CM | POA: Diagnosis not present

## 2016-09-23 DIAGNOSIS — E1122 Type 2 diabetes mellitus with diabetic chronic kidney disease: Secondary | ICD-10-CM | POA: Diagnosis not present

## 2016-09-23 DIAGNOSIS — R296 Repeated falls: Secondary | ICD-10-CM | POA: Diagnosis not present

## 2016-09-23 DIAGNOSIS — Z95810 Presence of automatic (implantable) cardiac defibrillator: Secondary | ICD-10-CM

## 2016-09-23 MED ORDER — LOSARTAN POTASSIUM 25 MG PO TABS: 25.0000 mg | ORAL_TABLET | Freq: Every day | ORAL | 3 refills | Status: DC

## 2016-09-23 NOTE — Assessment & Plan Note (Signed)
Dr Taylor follows 

## 2016-09-23 NOTE — Assessment & Plan Note (Signed)
He says if he takes low dose steroid he is amble to ambulate, otherwise he is wheel chair bound

## 2016-09-23 NOTE — Assessment & Plan Note (Signed)
a. s/p CABG 1990 with Occlusion of both VGs.  b. s/p PCI of CTO of RCA 10/05/14

## 2016-09-23 NOTE — Assessment & Plan Note (Signed)
This is improved aff Lisinopril and Aldactone and having held his Lasix for two days

## 2016-09-23 NOTE — Progress Notes (Signed)
09/23/2016 Victor [name]   1936/07/21  [id number]  Primary Physician Leonides Sake, MD Primary Cardiologist: Dr Martinique  HPI:  80 y/o Caucasian male followed by Dr Martinique. 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 At one time they had 20 horses, they're now down to 5. The pt has known CAD s/p CABG x 2 in 1990 with multiple PCIs since that time. His last PCI was 10/01/14 when he underwent CTO PCI of the RCA. He also has chronic combined systolic + diastolic CHF with EF of [id number] by echo Aug 2016. He had VT in 2012 and is S/p BS ICD followed by Dr Lovena Le. He has significant DJD and takes steroids once a week PRN. He says they help but his BS goes up. He is in a wheel chair and on O2. I saw him in the office 09/16/16 and he was hypotensive and weak. I stopped his Aldactone, Lisinopril, and Lasix. F/u lab work showed BUN 18/ SCr 1.28, K+ 5.2, Hgb 9.9. I suggested he resume his Lasix 40 mg but I stopped his Aldactone (high K+) and his Lisinopril. I saw him today in the office in follow up. He is better, less weak. He denies increased dyspnea. His B/P is [id number] systolic.    Current Outpatient Prescriptions  Medication Sig Dispense Refill  . aspirin (ASPIR-81) 81 MG EC tablet Take 81 mg by mouth at bedtime.     . bisoprolol (ZEBETA) 5 MG tablet Take 0.5 tablets (2.5 mg total) by mouth daily. 90 tablet 3  . clopidogrel (PLAVIX) 75 MG tablet Take 1 tablet (75 mg total) by mouth daily. 90 tablet 3  . dexamethasone (DECADRON) 4 MG tablet Take 4 mg by mouth daily as needed (back pain).   0  . DULoxetine (CYMBALTA) 30 MG capsule Take 30 mg by mouth daily. Take one capsule by mouth daily for 1 week then increase to 2 capsules daily.     . furosemide (LASIX) 40 MG tablet Take 1 tablet (40 mg total) by mouth daily. 90 tablet 3  . HYDROcodone-acetaminophen (NORCO) 10-325 MG per tablet Take 1 tablet by mouth every 8 (eight) hours as needed for moderate pain.   0  . LEVEMIR FLEXTOUCH 100 UNIT/ML Pen INJECT 30  UNITS UNDER SKIN EACH MORNING  1  . LYRICA 150 MG capsule Take 150 mg by mouth daily.   1  . metFORMIN (GLUCOPHAGE) 1000 MG tablet Take 1,000 mg by mouth 2 (two) times daily with a meal.    . mupirocin ointment (BACTROBAN) 2 % Apply 1 application topically 2 (two) times daily.  3  . nitroGLYCERIN (NITROSTAT) 0.4 MG SL tablet Place 0.4 mg under the tongue every 5 (five) minutes as needed for chest pain.    Marland Kitchen omeprazole (PRILOSEC) 20 MG capsule Take 1 capsule (20 mg total) by mouth daily. 30 capsule 6  . ONE TOUCH ULTRA TEST test strip CHECK SUGAR AS DIRECTED 3 TIMES DAILY  5  . pravastatin (PRAVACHOL) 40 MG tablet Take 1 tablet (40 mg total) by mouth at bedtime. 90 tablet 3  . SANTYL ointment Apply 1 application topically every other day.  2  . traZODone (DESYREL) 50 MG tablet Take 1 tablet by mouth at bedtime.  1  . vitamin B-12 (CYANOCOBALAMIN) 1000 MCG tablet Take 1,000 mcg by mouth daily.    Marland Kitchen losartan (COZAAR) 25 MG tablet Take 1 tablet (25 mg total) by mouth daily. 90 tablet 3   No current facility-administered  medications for this visit.     Allergies  Allergen Reactions  . Coreg [Carvedilol] Other (See Comments)    Shortness of breath ,fatigue ,dizzyness   . Lovastatin Other (See Comments)    CK elevation, Myalgias  . Ativan [Lorazepam] Other (See Comments)    Pt. had side effects    Social History   Social History  . Marital status: Married    Spouse name: N/A  . Number of children: N/A  . Years of education: N/A   Occupational History  . Not on file.   Social History Main Topics  . Smoking status: Former Smoker    Years: 3.00    Types: Pipe  . Smokeless tobacco: Never Used     Comment: "quit smoking pipe in early 1970's"  . Alcohol use Yes     Comment: 09/30/2014 "wine w/dinner maybe once/yr"  . Drug use: No  . Sexual activity: No   Other Topics Concern  . Not on file   Social History Narrative   Complete 2 yrs of college, is w Human resources officer, is a Public house manager for [employer], and is married. Has 3 daughters. 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      Review of Systems: General: negative for chills, fever, night sweats or weight changes.  Cardiovascular: negative for chest pain, dyspnea on exertion, edema, orthopnea, palpitations, paroxysmal nocturnal dyspnea or shortness of breath Dermatological: negative for rash Respiratory: negative for cough or wheezing Urologic: negative for hematuria Abdominal: negative for nausea, vomiting, diarrhea, bright red blood per rectum, melena, or hematemesis Neurologic: negative for visual changes, syncope, or dizziness All other systems reviewed and are otherwise negative except as noted above.    Blood pressure 133/64, pulse 74, height 5\' 8"  (1.727 m), weight 184 lb 3.2 oz (83.6 kg).  General appearance: alert, cooperative, no distress and in wheel chair, on O2 Lungs: clear to auscultation bilaterally Heart: regular rate and rhythm Extremities: no edema Skin: pale, cool, dry Neurologic: Grossly normal   ASSESSMENT AND PLAN:   Orthostatic hypotension This is improved aff Lisinopril and Aldactone and having held his Lasix for two days  Cardiomyopathy, ischemic EF 35-40%  By echo Aug 2016  Insulin dependent type 2 diabetes mellitus, controlled ([name]) Pt adjust his insulin daily  Arthropathy He says if he takes low dose steroid he is amble to ambulate, otherwise he is wheel chair bound  Hx of CABG 1990 a. s/p CABG 1990 with Occlusion of both VGs.  b. s/p PCI of CTO of RCA 10/05/14  ICD- BS Nov 2012 for VT Dr Lovena Le follows  Chronic clinical systolic heart failure Compensated   PLAN  I told him to stop Aldactone and Lisinopril. I added Cozaar 25 mg and will discuss adding Entresto with Dr Martinique. If Delene Loll is added I'll see him back in two weeks, if not he can f/u with dr Martinique in 3 mos.   Kerin Ransom PA-C 09/23/2016 8:53 AM

## 2016-09-23 NOTE — Assessment & Plan Note (Signed)
EF 35-40%  By echo Aug 2016

## 2016-09-23 NOTE — Patient Instructions (Signed)
Medication Instructions:  STOP ALDACTONE STOP LISINOPRIL  START LOSARTAN 25 MG DAILY  Labwork: NONE  Testing/Procedures: NONE  Follow-Up: WILL CALL YOU WITH FOLLOW UP APPOINTMENT   If you need a refill on your cardiac medications before your next appointment, please call your pharmacy.

## 2016-09-23 NOTE — Assessment & Plan Note (Signed)
Pt adjust his insulin daily

## 2016-09-23 NOTE — Assessment & Plan Note (Signed)
Compensated 

## 2016-09-27 DIAGNOSIS — I10 Essential (primary) hypertension: Secondary | ICD-10-CM | POA: Diagnosis not present

## 2016-09-27 DIAGNOSIS — E1122 Type 2 diabetes mellitus with diabetic chronic kidney disease: Secondary | ICD-10-CM | POA: Diagnosis not present

## 2016-09-27 DIAGNOSIS — R296 Repeated falls: Secondary | ICD-10-CM | POA: Diagnosis not present

## 2016-09-27 DIAGNOSIS — S50912D Unspecified superficial injury of left forearm, subsequent encounter: Secondary | ICD-10-CM | POA: Diagnosis not present

## 2016-09-27 DIAGNOSIS — Z9181 History of falling: Secondary | ICD-10-CM | POA: Diagnosis not present

## 2016-09-27 DIAGNOSIS — S50911D Unspecified superficial injury of right forearm, subsequent encounter: Secondary | ICD-10-CM | POA: Diagnosis not present

## 2016-09-28 ENCOUNTER — Encounter: Payer: Self-pay | Admitting: Cardiology

## 2016-09-29 NOTE — Progress Notes (Signed)
Dr Martinique would like to start Mr Branscomb on Entresto 24/26 and stop his Losartan. He will need a f/u in the office in two weeks with myself, Dr Martinique, or another APP at [facility] to adjust the dose. Thanks  Kerin Ransom PA-C 09/29/2016 1:47 PM

## 2016-09-30 ENCOUNTER — Telehealth: Payer: Self-pay | Admitting: *Deleted

## 2016-09-30 DIAGNOSIS — Z9181 History of falling: Secondary | ICD-10-CM | POA: Diagnosis not present

## 2016-09-30 DIAGNOSIS — S50911D Unspecified superficial injury of right forearm, subsequent encounter: Secondary | ICD-10-CM | POA: Diagnosis not present

## 2016-09-30 DIAGNOSIS — I10 Essential (primary) hypertension: Secondary | ICD-10-CM | POA: Diagnosis not present

## 2016-09-30 DIAGNOSIS — R296 Repeated falls: Secondary | ICD-10-CM | POA: Diagnosis not present

## 2016-09-30 DIAGNOSIS — S50912D Unspecified superficial injury of left forearm, subsequent encounter: Secondary | ICD-10-CM | POA: Diagnosis not present

## 2016-09-30 DIAGNOSIS — E1122 Type 2 diabetes mellitus with diabetic chronic kidney disease: Secondary | ICD-10-CM | POA: Diagnosis not present

## 2016-09-30 MED ORDER — SACUBITRIL-VALSARTAN 24-26 MG PO TABS: 1.0000 | ORAL_TABLET | Freq: Two times a day (BID) | ORAL | 6 refills | Status: DC

## 2016-09-30 NOTE — Telephone Encounter (Signed)
Received a call from [facility] regarding the patients Entresto and possible drug interaction  Patient picked up Rx for Lisinopril in October and Rx for Losartan on 11-3  Reviewed Doreene Burke PA last ov and per ov patient was to stop Lisinopril and start Losartan until Lurena Joiner could discuss Entresto with Dr Martinique. Per Dr Martinique stop Losartan and start [facility] pharmacist and tried calling patient to make sure he was doing as recommended Left message to call back

## 2016-09-30 NOTE — Telephone Encounter (Signed)
Spoke with pt wife, she voiced understanding to stop losartan for 2 days and then start the entresto. New script sent to the pharmacy Follow up scheduled in 2 weeks with luke kilroy pa.

## 2016-09-30 NOTE — Telephone Encounter (Signed)
Dr Martinique would like to start Mr Mendillo on Entresto 24/26 and stop his Losartan. He will need a f/u in the office in two weeks with myself, Dr Martinique, or another APP at [facility] to adjust the dose. Thanks  Kerin Ransom PA-C 09/29/2016 1:47 PM

## 2016-10-03 ENCOUNTER — Telehealth: Payer: Self-pay

## 2016-10-03 NOTE — Telephone Encounter (Signed)
Patient understands to stop taking losartan before he starts taking entresto.

## 2016-10-03 NOTE — Telephone Encounter (Signed)
Called patient no answer.Left message on personal voice mail to call me regarding directions with taking entresto.

## 2016-10-03 NOTE — Telephone Encounter (Signed)
Received a call from patient's wife.She stated husband's copay for entresto 30 day suppply $90, he can not afford.Samples of entresto 24/26 left at front desk.Wife will call back to apply for entresto patient assistance.

## 2016-10-04 DIAGNOSIS — S50912D Unspecified superficial injury of left forearm, subsequent encounter: Secondary | ICD-10-CM | POA: Diagnosis not present

## 2016-10-04 DIAGNOSIS — S50911D Unspecified superficial injury of right forearm, subsequent encounter: Secondary | ICD-10-CM | POA: Diagnosis not present

## 2016-10-04 DIAGNOSIS — E1122 Type 2 diabetes mellitus with diabetic chronic kidney disease: Secondary | ICD-10-CM | POA: Diagnosis not present

## 2016-10-04 DIAGNOSIS — R296 Repeated falls: Secondary | ICD-10-CM | POA: Diagnosis not present

## 2016-10-04 DIAGNOSIS — I10 Essential (primary) hypertension: Secondary | ICD-10-CM | POA: Diagnosis not present

## 2016-10-04 DIAGNOSIS — Z9181 History of falling: Secondary | ICD-10-CM | POA: Diagnosis not present

## 2016-10-04 NOTE — Telephone Encounter (Signed)
See 10/03/16 telephone note.

## 2016-10-06 DIAGNOSIS — R296 Repeated falls: Secondary | ICD-10-CM | POA: Diagnosis not present

## 2016-10-06 DIAGNOSIS — S50912D Unspecified superficial injury of left forearm, subsequent encounter: Secondary | ICD-10-CM | POA: Diagnosis not present

## 2016-10-06 DIAGNOSIS — Z9181 History of falling: Secondary | ICD-10-CM | POA: Diagnosis not present

## 2016-10-06 DIAGNOSIS — E1122 Type 2 diabetes mellitus with diabetic chronic kidney disease: Secondary | ICD-10-CM | POA: Diagnosis not present

## 2016-10-06 DIAGNOSIS — S50911D Unspecified superficial injury of right forearm, subsequent encounter: Secondary | ICD-10-CM | POA: Diagnosis not present

## 2016-10-06 DIAGNOSIS — I10 Essential (primary) hypertension: Secondary | ICD-10-CM | POA: Diagnosis not present

## 2016-10-11 ENCOUNTER — Ambulatory Visit: Payer: Medicare Other | Admitting: Cardiology

## 2016-10-11 ENCOUNTER — Telehealth: Payer: Self-pay | Admitting: *Deleted

## 2016-10-11 DIAGNOSIS — M5489 Other dorsalgia: Secondary | ICD-10-CM | POA: Diagnosis not present

## 2016-10-11 DIAGNOSIS — R031 Nonspecific low blood-pressure reading: Secondary | ICD-10-CM | POA: Diagnosis not present

## 2016-10-11 DIAGNOSIS — Z5321 Procedure and treatment not carried out due to patient leaving prior to being seen by health care provider: Secondary | ICD-10-CM | POA: Diagnosis not present

## 2016-10-11 DIAGNOSIS — M549 Dorsalgia, unspecified: Secondary | ICD-10-CM | POA: Diagnosis not present

## 2016-10-11 NOTE — Telephone Encounter (Signed)
Pt was scheduled to see Kerin Ransom, PA-C 10-11-16, and received a call that pts wife was involved in a accident this morning and was killed, so pt had to cancel.  I called pt after viewing his chart and found out that he was just startred on Entresto 24/26 on 10-04-16 and per Malachy Mood, was only given 1 week worth of samples so I got up another weeks worth, to get pt thru the funeral and the holidays, so he would not run out.  Pt knows that they are at the front desk and he will send someone to pick them up.  Pt was given our Condolences.  Pt verbalized appreciation for the call and the concern.

## 2016-10-12 ENCOUNTER — Encounter: Payer: Self-pay | Admitting: Cardiology

## 2016-10-17 DIAGNOSIS — S50911D Unspecified superficial injury of right forearm, subsequent encounter: Secondary | ICD-10-CM | POA: Diagnosis not present

## 2016-10-17 DIAGNOSIS — Z9181 History of falling: Secondary | ICD-10-CM | POA: Diagnosis not present

## 2016-10-17 DIAGNOSIS — E1122 Type 2 diabetes mellitus with diabetic chronic kidney disease: Secondary | ICD-10-CM | POA: Diagnosis not present

## 2016-10-17 DIAGNOSIS — R296 Repeated falls: Secondary | ICD-10-CM | POA: Diagnosis not present

## 2016-10-17 DIAGNOSIS — S50912D Unspecified superficial injury of left forearm, subsequent encounter: Secondary | ICD-10-CM | POA: Diagnosis not present

## 2016-10-17 DIAGNOSIS — I10 Essential (primary) hypertension: Secondary | ICD-10-CM | POA: Diagnosis not present

## 2016-10-25 DIAGNOSIS — E1149 Type 2 diabetes mellitus with other diabetic neurological complication: Secondary | ICD-10-CM | POA: Diagnosis not present

## 2016-10-25 DIAGNOSIS — M5136 Other intervertebral disc degeneration, lumbar region: Secondary | ICD-10-CM | POA: Diagnosis not present

## 2016-10-25 DIAGNOSIS — J9611 Chronic respiratory failure with hypoxia: Secondary | ICD-10-CM | POA: Diagnosis not present

## 2016-10-25 DIAGNOSIS — M5030 Other cervical disc degeneration, unspecified cervical region: Secondary | ICD-10-CM | POA: Diagnosis not present

## 2016-10-25 DIAGNOSIS — I255 Ischemic cardiomyopathy: Secondary | ICD-10-CM | POA: Diagnosis not present

## 2016-10-27 ENCOUNTER — Encounter: Payer: Self-pay | Admitting: Cardiology

## 2016-10-27 ENCOUNTER — Ambulatory Visit (INDEPENDENT_AMBULATORY_CARE_PROVIDER_SITE_OTHER): Payer: Medicare Other | Admitting: Cardiology

## 2016-10-27 DIAGNOSIS — Z95810 Presence of automatic (implantable) cardiac defibrillator: Secondary | ICD-10-CM | POA: Diagnosis not present

## 2016-10-27 DIAGNOSIS — M159 Polyosteoarthritis, unspecified: Secondary | ICD-10-CM

## 2016-10-27 DIAGNOSIS — M8949 Other hypertrophic osteoarthropathy, multiple sites: Secondary | ICD-10-CM

## 2016-10-27 DIAGNOSIS — Z951 Presence of aortocoronary bypass graft: Secondary | ICD-10-CM | POA: Diagnosis not present

## 2016-10-27 DIAGNOSIS — M150 Primary generalized (osteo)arthritis: Secondary | ICD-10-CM

## 2016-10-27 DIAGNOSIS — I5022 Chronic systolic (congestive) heart failure: Secondary | ICD-10-CM | POA: Diagnosis not present

## 2016-10-27 DIAGNOSIS — I2589 Other forms of chronic ischemic heart disease: Secondary | ICD-10-CM

## 2016-10-27 DIAGNOSIS — N183 Chronic kidney disease, stage 3 unspecified: Secondary | ICD-10-CM | POA: Insufficient documentation

## 2016-10-27 DIAGNOSIS — I255 Ischemic cardiomyopathy: Secondary | ICD-10-CM

## 2016-10-27 MED ORDER — LISINOPRIL 2.5 MG PO TABS: 2.5000 mg | ORAL_TABLET | Freq: Every day | ORAL | 3 refills | Status: DC

## 2016-10-27 NOTE — Patient Instructions (Signed)
Medication Instructions: START Lisinopril 2.5 mg daily---starting on 10/30/16   Follow-Up: Your physician recommends that you schedule a follow-up appointment in: 3 months with Dr. Martinique  *Cheryl will contact you about a referral to Orthopaedics*  If you need a refill on your cardiac medications before your next appointment, please call your pharmacy.

## 2016-10-27 NOTE — Assessment & Plan Note (Signed)
EF 35-40%  By echo Aug 2016

## 2016-10-27 NOTE — Assessment & Plan Note (Signed)
BS Nov 2012 for VT

## 2016-10-27 NOTE — Assessment & Plan Note (Signed)
Last SCr was 1.28 in Oct 2017

## 2016-10-27 NOTE — Progress Notes (Signed)
10/27/2016 [name]   12-Aug-1936  [id number]  Primary Physician Leonides Sake, MD Primary Cardiologist: Dr Martinique  HPI:  80 y/o Caucasian male followed by Dr Martinique. 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 At one time they had 20 horses,  now down to 5.  The pt's wife was  killed in a MVA last month. He now lives alone but has family help. We had been trying to get him on Entresto since his LOV 09/23/16. The pt has known CAD s/p CABG x 2 in 1990 with multiple PCIs since that time. His last PCI was 10/01/14 when he underwent CTO PCI of the RCA. He also has chronic combined systolic + diastolic CHF with EF of [id number] by echo Aug 2016. He had VT in 2012 and is S/p BS ICD followed by Dr Lovena Le. He has significant DJD and takes steroids once a week PRN. He says they help but his BS goes up. He is in a wheel chair and on O2. I saw him in the office 09/16/16 and he was hypotensive and weak. I stopped his Aldactone, Lisinopril, and Lasix. F/u lab work showed BUN 18/ SCr 1.28, K+ 5.2, Hgb 9.9. I suggested he resume his Lasix 40 mg but I stopped his Aldactone (high K+) and his Lisinopril. When I saw him 09/23/16 hee was better, less weak. His B/P was [id number] systolic. At that time we added Cozaar 25 mg with plans to transition him to [facility] which we did with sample. In the meantime his wife had this terribly MVA and was killed. The pt is in the office today for follow up. He says he gets dizzy when he stands up. His  B/P is 98 systolic. He adits to taking extra Lasix at times for LE edema.    Current Outpatient Prescriptions  Medication Sig Dispense Refill  . aspirin (ASPIR-81) 81 MG EC tablet Take 81 mg by mouth at bedtime.     . bisoprolol (ZEBETA) 5 MG tablet Take 0.5 tablets (2.5 mg total) by mouth daily. 90 tablet 3  . clopidogrel (PLAVIX) 75 MG tablet Take 1 tablet (75 mg total) by mouth daily. 90 tablet 3  . dexamethasone (DECADRON) 4 MG tablet Take 4 mg by mouth daily as needed (back pain).   0   . DULoxetine (CYMBALTA) 30 MG capsule Take 30 mg by mouth daily. Take one capsule by mouth daily for 1 week then increase to 2 capsules daily.     . furosemide (LASIX) 40 MG tablet Take 1 tablet (40 mg total) by mouth daily. 90 tablet 3  . HYDROcodone-acetaminophen (NORCO) 10-325 MG per tablet Take 1 tablet by mouth every 8 (eight) hours as needed for moderate pain.   0  . LEVEMIR FLEXTOUCH 100 UNIT/ML Pen INJECT 30 UNITS UNDER SKIN EACH MORNING  1  . losartan (COZAAR) 25 MG tablet Take 1 tablet by mouth daily.  3  . LYRICA 150 MG capsule Take 150 mg by mouth daily.   1  . metFORMIN (GLUCOPHAGE) 1000 MG tablet Take 1,000 mg by mouth 2 (two) times daily with a meal.    . mupirocin ointment (BACTROBAN) 2 % Apply 1 application topically 2 (two) times daily.  3  . nitroGLYCERIN (NITROSTAT) 0.4 MG SL tablet Place 0.4 mg under the tongue every 5 (five) minutes as needed for chest pain.    Marland Kitchen omeprazole (PRILOSEC) 20 MG capsule Take 1 capsule (20 mg total) by mouth daily. 30 capsule  6  . ONE TOUCH ULTRA TEST test strip CHECK SUGAR AS DIRECTED 3 TIMES DAILY  5  . pravastatin (PRAVACHOL) 40 MG tablet Take 1 tablet (40 mg total) by mouth at bedtime. 90 tablet 3  . sacubitril-valsartan (ENTRESTO) 24-26 MG Take 1 tablet by mouth 2 (two) times daily. 60 tablet 6  . SANTYL ointment Apply 1 application topically every other day.  2  . traZODone (DESYREL) 50 MG tablet Take 1 tablet by mouth at bedtime.  1  . vitamin B-12 (CYANOCOBALAMIN) 1000 MCG tablet Take 1,000 mcg by mouth daily.    Marland Kitchen lisinopril (PRINIVIL,ZESTRIL) 2.5 MG tablet Take 1 tablet (2.5 mg total) by mouth daily. 90 tablet 3   No current facility-administered medications for this visit.     Allergies  Allergen Reactions  . Coreg [Carvedilol] Other (See Comments)    Shortness of breath ,fatigue ,dizzyness   . Lovastatin Other (See Comments)    CK elevation, Myalgias  . Ativan [Lorazepam] Other (See Comments)    Pt. had side effects     Social History   Social History  . Marital status: Married    Spouse name: N/A  . Number of children: N/A  . Years of education: N/A   Occupational History  . Not on file.   Social History Main Topics  . Smoking status: Former Smoker    Years: 3.00    Types: Pipe  . Smokeless tobacco: Never Used     Comment: "quit smoking pipe in early 1970's"  . Alcohol use Yes     Comment: 09/30/2014 "wine w/dinner maybe once/yr"  . Drug use: No  . Sexual activity: No   Other Topics Concern  . 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      Review of Systems: General: negative for chills, fever, night sweats or weight changes.  Cardiovascular: negative for chest pain, dyspnea on exertion, edema, orthopnea, palpitations, paroxysmal nocturnal dyspnea or shortness of breath Dermatological: negative for rash Respiratory: negative for cough or wheezing Urologic: negative for hematuria Abdominal: negative for nausea, vomiting, diarrhea, bright red blood per rectum, melena, or hematemesis Neurologic: negative for visual changes, syncope, or dizziness He is unable to abduct his Lt shoulder past 90 degrees "weak, not pain" All other systems reviewed and are otherwise negative except as noted above.    Blood pressure (!) 98/50, pulse 70, height 5\' 8"  (1.727 m), weight 179 lb 6.4 oz (81.4 kg).  General appearance: alert, cooperative, no distress, pale and in wheel chair, on O2 Neck: no JVD Lungs: clear to auscultation bilaterally Heart: regular rate and rhythm Extremities: trace edema L> R, he is unable to abduct his Lt shoulder past 90 degrees Skin: pale cool dry Neurologic: Grossly normal    ASSESSMENT AND PLAN:   Chronic clinical systolic heart failure Currently stable, he takes extra Lasix PRN LE edema. He could not tolerate  Entresto secondary to low b/P-low dose ACE resumed  Cardiomyopathy, ischemic EF 35-40%  By echo Aug 2016  Hx of CABG 1990 a. s/p CABG 1990 with Occlusion of both VGs.  b. s/p PCI of CTO of RCA 10/05/14 No recent angina  ICD-  BS Nov 2012 for VT  Emphysema of lung ([place]) Chronic O2 use  Osteoarthritis He has had prior spinal injections which he says "did not  work". He is not felt to be an operative candidate. He has recently developed Lt arm/ shoulder weakness and impairment  Chronic renal insufficiency, stage 3 (moderate) Last SCr was 1.28 in Oct 2017   PLAN  I suggested we stop the Entrsto and resume Lisinopril 2.5 mg in two days- he had been on this in the past. I'm concerned about the decreased ROM of his Lt shoulder and suggested he see an orthopedist for further evaluation. He is not a candidate for NSAIDs secondary to CRI-3.   Kerin Ransom PA-C 10/27/2016 1:39 PM

## 2016-10-27 NOTE — Assessment & Plan Note (Signed)
Chronic O2 use

## 2016-10-27 NOTE — Assessment & Plan Note (Signed)
He has had prior spinal injections which he says "did not work". He is not felt to be an operative candidate. He has recently developed Lt arm/ shoulder weakness and impairment

## 2016-10-27 NOTE — Assessment & Plan Note (Signed)
Currently stable, he takes extra Lasix PRN LE edema. He could not tolerate Entresto secondary to low b/P-low dose ACE resumed

## 2016-10-27 NOTE — Assessment & Plan Note (Signed)
a. s/p CABG 1990 with Occlusion of both VGs.  b. s/p PCI of CTO of RCA 10/05/14 No recent angina

## 2016-10-28 ENCOUNTER — Telehealth: Payer: Self-pay | Admitting: Cardiology

## 2016-10-28 NOTE — Telephone Encounter (Signed)
Pt's son returning this office's call.    Please give him a call back.

## 2016-10-31 NOTE — Telephone Encounter (Signed)
Spoke to patient's grandson Victor [name].He stated he is helping grand father now since his wife passed away.Advised Victor Ransom PA wanted him to see orthopedics for left shoulder pain.I spoke to [facility] was told PCP needs to send them a referral.I spoke to [place] at PCP [place] office, she stated patient's insurance does not require a PCP referral.She will call [facility] and schedule him a appointment.

## 2016-10-31 NOTE — Telephone Encounter (Signed)
Spoke to patient's grandson [name] he stated grandfather has appointment with [facility] 11/11/16.

## 2016-11-02 DIAGNOSIS — R296 Repeated falls: Secondary | ICD-10-CM | POA: Diagnosis not present

## 2016-11-02 DIAGNOSIS — Z9181 History of falling: Secondary | ICD-10-CM | POA: Diagnosis not present

## 2016-11-02 DIAGNOSIS — S50912D Unspecified superficial injury of left forearm, subsequent encounter: Secondary | ICD-10-CM | POA: Diagnosis not present

## 2016-11-02 DIAGNOSIS — E1122 Type 2 diabetes mellitus with diabetic chronic kidney disease: Secondary | ICD-10-CM | POA: Diagnosis not present

## 2016-11-02 DIAGNOSIS — I10 Essential (primary) hypertension: Secondary | ICD-10-CM | POA: Diagnosis not present

## 2016-11-02 DIAGNOSIS — S50911D Unspecified superficial injury of right forearm, subsequent encounter: Secondary | ICD-10-CM | POA: Diagnosis not present

## 2016-11-03 LAB — CUP PACEART REMOTE DEVICE CHECK
Battery Remaining Longevity: 114 mo
Battery Remaining Percentage: 100 %
Date Time Interrogation Session: 20171102185500
HighPow Impedance: 49 Ohm
Implantable Lead Location: 753860
Implantable Lead Model: 158
Implantable Lead Serial Number: 154635
Implantable Pulse Generator Implant Date: 20121114
Lead Channel Setting Pacing Amplitude: 2.4 V
Lead Channel Setting Sensing Sensitivity: 0.4 mV
MDC IDC LEAD IMPLANT DT: 20050923
MDC IDC MSMT LEADCHNL RV IMPEDANCE VALUE: 410 Ohm
MDC IDC MSMT LEADCHNL RV PACING THRESHOLD AMPLITUDE: 0.9 V
MDC IDC MSMT LEADCHNL RV PACING THRESHOLD PULSEWIDTH: 0.4 ms
MDC IDC PG SERIAL: 118170
MDC IDC SET LEADCHNL RV PACING PULSEWIDTH: 0.4 ms
MDC IDC STAT BRADY RV PERCENT PACED: 0 %

## 2016-11-10 DIAGNOSIS — M12812 Other specific arthropathies, not elsewhere classified, left shoulder: Secondary | ICD-10-CM | POA: Diagnosis not present

## 2016-11-10 DIAGNOSIS — M25512 Pain in left shoulder: Secondary | ICD-10-CM | POA: Diagnosis not present

## 2016-11-25 ENCOUNTER — Emergency Department (HOSPITAL_COMMUNITY): Payer: Medicare Other

## 2016-11-25 ENCOUNTER — Encounter (HOSPITAL_COMMUNITY): Payer: Self-pay | Admitting: *Deleted

## 2016-11-25 ENCOUNTER — Emergency Department (HOSPITAL_COMMUNITY)
Admission: EM | Admit: 2016-11-25 | Discharge: 2016-11-26 | Disposition: A | Discharge status: Home / Self Care | Payer: Medicare Other | Attending: Emergency Medicine | Admitting: Emergency Medicine

## 2016-11-25 DIAGNOSIS — Y939 Activity, unspecified: Secondary | ICD-10-CM | POA: Insufficient documentation

## 2016-11-25 DIAGNOSIS — N183 Chronic kidney disease, stage 3 (moderate): Secondary | ICD-10-CM | POA: Diagnosis not present

## 2016-11-25 DIAGNOSIS — Z7984 Long term (current) use of oral hypoglycemic drugs: Secondary | ICD-10-CM | POA: Diagnosis not present

## 2016-11-25 DIAGNOSIS — Y929 Unspecified place or not applicable: Secondary | ICD-10-CM | POA: Diagnosis not present

## 2016-11-25 DIAGNOSIS — Z87891 Personal history of nicotine dependence: Secondary | ICD-10-CM | POA: Diagnosis not present

## 2016-11-25 DIAGNOSIS — S2242XA Multiple fractures of ribs, left side, initial encounter for closed fracture: Secondary | ICD-10-CM | POA: Diagnosis not present

## 2016-11-25 DIAGNOSIS — Y999 Unspecified external cause status: Secondary | ICD-10-CM | POA: Insufficient documentation

## 2016-11-25 DIAGNOSIS — Z7982 Long term (current) use of aspirin: Secondary | ICD-10-CM | POA: Diagnosis not present

## 2016-11-25 DIAGNOSIS — Z8673 Personal history of transient ischemic attack (TIA), and cerebral infarction without residual deficits: Secondary | ICD-10-CM | POA: Insufficient documentation

## 2016-11-25 DIAGNOSIS — E1122 Type 2 diabetes mellitus with diabetic chronic kidney disease: Secondary | ICD-10-CM | POA: Insufficient documentation

## 2016-11-25 DIAGNOSIS — Z5321 Procedure and treatment not carried out due to patient leaving prior to being seen by health care provider: Secondary | ICD-10-CM | POA: Diagnosis not present

## 2016-11-25 DIAGNOSIS — R0781 Pleurodynia: Secondary | ICD-10-CM | POA: Diagnosis present

## 2016-11-25 DIAGNOSIS — W19XXXA Unspecified fall, initial encounter: Secondary | ICD-10-CM | POA: Diagnosis not present

## 2016-11-25 DIAGNOSIS — I129 Hypertensive chronic kidney disease with stage 1 through stage 4 chronic kidney disease, or unspecified chronic kidney disease: Secondary | ICD-10-CM | POA: Diagnosis not present

## 2016-11-25 DIAGNOSIS — I2510 Atherosclerotic heart disease of native coronary artery without angina pectoris: Secondary | ICD-10-CM | POA: Diagnosis not present

## 2016-11-25 NOTE — ED Triage Notes (Signed)
The pt is c/o a fall on Tuesday he has pain in his nose and pain in his lt lower ribs since then and his ribs hurt more each day

## 2016-11-26 NOTE — ED Notes (Signed)
Pt approached [facility] upset about wait time and demanded to have xrays read and info given to him; Pt left with out being seen with family; Charge RN notified that pt does have 6 ribs fractures on xray;

## 2016-12-10 ENCOUNTER — Other Ambulatory Visit: Payer: Self-pay | Admitting: Cardiology

## 2017-01-25 NOTE — Progress Notes (Signed)
Cardiology Office Note   Date:  01/27/2017   ID:  Victor [name], DOB 02-13-1936, MRN [id number]  PCP:  Cyndi Bender, PA-C  Cardiologist:  Dr. Kimesha Claxton Martinique   Electrophysiologist:  Dr. Cristopher Peru    Chief Complaint  Patient presents with  . Follow-up    DOE  . Congestive Heart Failure     History of Present Illness: Victor [name] is a 81 y.o. male is seen for follow up CAD and CHF. He has a hx of CAD, status post CABG in 1990 with known occlusion of both vein grafts, PCI to the SVG-OM3 in 2014 and attempted CTO PCI of the RCA 09/2014. Chronic combined systolic and diastolic CHF, ischemic cardiomyopathy with history of VT in 2012, status post ICD, diabetes, CKD, HTN, HL, PAD, carotid stenosis.  Admitted 02/2015 with acute CHF exacerbation. He had new EKG changes and underwent LHC with unsuccessful repeat attempt at PCI of CTO RCA. Medical therapy was continued.  Admitted 8/7-06/30/15 with acute on chronic combined systolic and diastolic CHF. Patient has a history of orthostatic hypotension.   Follow-up echo demonstrated no significant change with an EF of 35-40%.   Seen by Kerin Ransom PA in November and December 2017 for titration of medications. 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 At one time they had 20 horses,  now down to 5.  The pt's wife was  killed in a MVA last year. He now lives alone but has family help. Attempted to get him on Entresto  09/23/16 but unable to tolerate due to hypotension. He also has chronic combined systolic + diastolic CHF with EF of 19% by echo Aug 2016. He had VT in 2012 and is S/p BS ICD followed by Dr Lovena Le. He has significant DJD and takes steroids once a week PRN. He says they help but his BS goes up. He is in a wheel chair and on O2. When last seen his aldactone and lisinopril were held due to hypotension. Later started on low dose lisinopril.  On follow up today he states he is doing OK. Seen with his grandson who keeps track of his  medication. States weight is stable and no significant edema. On chronic oxygen and feels increased fatigue and dyspnea if not wearing. Finds his oxygen tank very limiting and this ties him to the house. Needs a portable unit. Wants to visit his daughter in [place] who has been quite ill. Reports he has been taking lasix every other day but think he needs to take daily. No dizziness. BS running 124-140.    Studies/Reports Reviewed Today:  Echo 06/29/15 Mild LVH, EF 35-40%, inferior HK, mild MR, mild LAE, PASP 42 mmHg Impressions: - Compared to the prior study, there has been no significantinterval change.  LHC 03/20/15 Left mainstem: Normal  LAD: Proximal 30-40%, D1 ostial 80%.  LCx: The LCx is occluded distally. The first and second OM branches are occluded.  RCA: Proximal severely calcified 90%, mid occluded with what appeared to be a micro channel through the lesion with filling distally as well as some left to right collaterals.  PCI Data: Unable to cross with a wire.  Final Conclusions:  1. Severe 2 vessel obstructive CAD with chronic occlusion of OMs and RCA 2. Known occlusion of SVGs from prior caths. 3. Normal right heart and LV filling pressures. 4. Unsuccessful attempt at PCI of the RCA due to inability to cross occlusion in the mid vessel after the RV marginal  branch. Recommendations: Continue medical therapy. Will review with Dr. Irish Lack to see if there are any further options for CTO PCI. If stable patient can be DC this weekend.   Myoview 02/05/14 High risk stress nuclear study with a large, severe intensity, partially reversible inferior lateral defect consistent with prior infarct and mild peri-infarct ischemia; possible transient ischemic dilatation of LV cavity.  LV Ejection Fraction: 25%. LV Wall Motion: Akinesis of the inferior lateral wall.  Last device check in March showed NSVT- longest 9 beats.  Past Medical History:  Diagnosis Date  . Anemia   . Arthritis     . Automatic implantable cardioverter-defibrillator in situ   . Cardiomyopathy, ischemic    a. s/p ICD placement  . Chronic combined systolic and diastolic CHF (congestive heart failure) ([place])   . Chronic lower back pain   . CKD (chronic kidney disease), stage III   . Coronary artery disease    a. s/p CABG 1990. b. multiple PCIs since that time including CTO angioplasty (without stenting) PCI of RCA 09/2014, with last cath 02/2015 done for worsening CHF with unsuccessful attempt at PCI of the RCA due to inability to cross occlusion - medical therapy advised at that time.  Marland Kitchen GERD (gastroesophageal reflux disease)   . HLD (hyperlipidemia)   . Hypertension   . Occlusion and stenosis of carotid artery without mention of cerebral infarction   . Orthostatic hypotension   . Paroxysmal ventricular tachycardia ([place])   . Peripheral vascular disease ([place])   . Raynaud's syndrome   . Stroke [facility])    a. CT head 06/2015: Small old lacunar infarct LEFT basal ganglia.  . Thrombocytopenia ([place])   . TIA (transient ischemic attack)    a. s/p LHC on 10/01/14   . Type II diabetes mellitus ([place])     Past Surgical History:  Procedure Laterality Date  . ANAL FISTULECTOMY  2000's  . CARDIAC CATHETERIZATION  07/2014  . CARDIAC CATHETERIZATION  10/01/2014   Procedure: CORONARY BALLOON ANGIOPLASTY;  Surgeon: Yasenia Reedy M Martinique, MD;  Location: [facility] CATH LAB;  Service: Cardiovascular;;  . CARDIAC DEFIBRILLATOR PLACEMENT  02/1989-12/2011   "I've had a total of 6 or 7 defibrillators put in" (09/30/2014)  . CATARACT EXTRACTION, BILATERAL Bilateral ~ 2009  . CORONARY ANGIOPLASTY  10/01/14   PTVA to CTO with restoration of TIMI 3  . CORONARY ANGIOPLASTY WITH STENT PLACEMENT  04/2013   "1"  . CORONARY ARTERY BYPASS GRAFT  01/1989   "CABG X3"   . FINGER FRACTURE SURGERY Left ~ 2000   "pointer"  . FRACTURE SURGERY    . IMPLANTABLE CARDIOVERTER DEFIBRILLATOR (ICD) GENERATOR CHANGE Left 10/05/2011   Procedure: ICD GENERATOR  CHANGE;  Surgeon: Evans Lance, MD;  Location: [facility] CATH LAB;  Service: Cardiovascular;  Laterality: Left;  . LEFT AND RIGHT HEART CATHETERIZATION WITH CORONARY/GRAFT ANGIOGRAM N/A 04/26/2013   Procedure: LEFT AND RIGHT HEART CATHETERIZATION WITH Beatrix Fetters;  Surgeon: Sherren Mocha, MD;  Location: [facility] CATH LAB;  Service: Cardiovascular;  Laterality: N/A;  . LEFT HEART CATHETERIZATION WITH CORONARY ANGIOGRAM N/A 08/01/2014   Procedure: LEFT HEART CATHETERIZATION WITH CORONARY ANGIOGRAM;  Surgeon: Sinclair Grooms, MD;  Location: [facility] CATH LAB;  Service: Cardiovascular;  Laterality: N/A;  . LEFT HEART CATHETERIZATION WITH CORONARY ANGIOGRAM N/A 03/20/2015   Procedure: LEFT HEART CATHETERIZATION WITH CORONARY ANGIOGRAM;  Surgeon: Simrit Gohlke M Martinique, MD;  Location: [facility] CATH LAB;  Service: Cardiovascular;  Laterality: N/A;  . PERCUTANEOUS STENT INTERVENTION  04/26/2013   Procedure:  PERCUTANEOUS STENT INTERVENTION;  Surgeon: Sherren Mocha, MD;  Location: [facility] CATH LAB;  Service: Cardiovascular;;  . RIGHT HEART CATHETERIZATION  10/01/2014   Procedure: RIGHT HEART CATH;  Surgeon: Dalyn Kjos M Martinique, MD;  Location: [facility] CATH LAB;  Service: Cardiovascular;;     Current Outpatient Prescriptions  Medication Sig Dispense Refill  . aspirin (ASPIR-81) 81 MG EC tablet Take 81 mg by mouth at bedtime.     . bisoprolol (ZEBETA) 5 MG tablet Take 0.5 tablets (2.5 mg total) by mouth daily. 90 tablet 3  . clopidogrel (PLAVIX) 75 MG tablet Take 1 tablet (75 mg total) by mouth daily. 90 tablet 3  . dexamethasone (DECADRON) 4 MG tablet Take 4 mg by mouth daily as needed (back pain).   0  . DULoxetine (CYMBALTA) 30 MG capsule Take 30 mg by mouth daily. Take one capsule by mouth daily for 1 week then increase to 2 capsules daily.     . furosemide (LASIX) 40 MG tablet Take 1 tablet (40 mg total) by mouth daily. 90 tablet 3  . HYDROcodone-acetaminophen (NORCO) 10-325 MG per tablet Take 1 tablet by mouth every 8 (eight) hours as  needed for moderate pain.   0  . LEVEMIR FLEXTOUCH 100 UNIT/ML Pen INJECT 30 UNITS UNDER SKIN EACH MORNING  1  . LYRICA 150 MG capsule Take 150 mg by mouth daily.   1  . metFORMIN (GLUCOPHAGE) 1000 MG tablet Take 1,000 mg by mouth 2 (two) times daily with a meal.    . mupirocin ointment (BACTROBAN) 2 % Apply 1 application topically 2 (two) times daily.  3  . nitroGLYCERIN (NITROSTAT) 0.4 MG SL tablet Place 0.4 mg under the tongue every 5 (five) minutes as needed for chest pain.    Marland Kitchen omeprazole (PRILOSEC) 20 MG capsule Take 1 capsule (20 mg total) by mouth daily. 30 capsule 6  . ONE TOUCH ULTRA TEST test strip CHECK SUGAR AS DIRECTED 3 TIMES DAILY  5  . pravastatin (PRAVACHOL) 40 MG tablet Take 1 tablet (40 mg total) by mouth at bedtime. 90 tablet 3  . SANTYL ointment Apply 1 application topically every other day.  2  . traZODone (DESYREL) 50 MG tablet Take 1 tablet by mouth at bedtime.  1  . vitamin B-12 (CYANOCOBALAMIN) 1000 MCG tablet Take 1,000 mcg by mouth daily.    Marland Kitchen losartan (COZAAR) 50 MG tablet Take 1 tablet (50 mg total) by mouth daily. 90 tablet 3   No current facility-administered medications for this visit.     Allergies:   Coreg [carvedilol]; Lovastatin; and Ativan [lorazepam]    Social History:  The patient  reports that he has quit smoking. His smoking use included Pipe. He quit after 3.00 years of use. He has never used smokeless tobacco. He reports that he drinks alcohol. He reports that he does not use drugs.   Family History:  The patient's family history includes Heart attack in his father; Heart failure (age of onset: 63) in his mother; Leukemia in his sister; Stomach cancer in his maternal grandfather.    ROS:   Please see the history of present illness.   Review of Systems  Constitution: Positive for malaise/fatigue. Negative for decreased appetite.  Cardiovascular: Positive for dyspnea on exertion. Negative for leg swelling and near-syncope.  Respiratory:  Positive for shortness of breath. Negative for wheezing.   Hematologic/Lymphatic: Negative for bleeding problem. Does not bruise/bleed easily.  Musculoskeletal: Positive for back pain. Negative for muscle weakness and neck pain.  Gastrointestinal: Negative for bloating.  Neurological: Negative for dizziness and loss of balance.  All other systems reviewed and are negative.    PHYSICAL EXAM: VS:  BP 140/66 (BP Location: Right Arm, Patient Position: Sitting, Cuff Size: Large)   Pulse 80   Ht 5\' 8"  (1.727 m)   Wt 179 lb 9.6 oz (81.5 kg)   SpO2 (!) 89%   BMI 27.31 kg/m     Wt Readings from Last 3 Encounters:  01/27/17 179 lb 9.6 oz (81.5 kg)  11/25/16 174 lb (78.9 kg)  10/27/16 179 lb 6.4 oz (81.4 kg)     GEN: Well nourished, chronically ill appearing, in no acute distress  HEENT: normal  Neck: no JVD,   no masses Cardiac:  Normal S1/S2, RRR; no murmur ,  no rubs or gallops, No edema Respiratory:  Decreased breath sounds bilaterally, no wheezing, rhonchi or rales. GI: soft, nontender, nondistended, + BS MS: no deformity or atrophy  Skin: warm and dry  Neuro:  CNs II-XII intact, Strength and sensation are intact Psych: Normal affect   EKG:  EKG is not ordered today.   Recent Labs: 09/16/2016: BUN 18; Creat 1.28; Hemoglobin 9.9; Platelets 222; Potassium 5.2; Sodium 139    Lipid Panel    Component Value Date/Time   CHOL 127 06/29/2015 0508   TRIG 111 06/29/2015 0508   HDL 36 (L) 06/29/2015 0508   CHOLHDL 3.5 06/29/2015 0508   VLDL 22 06/29/2015 0508   LDLCALC 69 06/29/2015 0508    Labs dated 05/12/16: cholesterol 127, triglycerides 166, HDL 48, LDL 46.  Dated 08/10/16: A1c 7.7%.   ASSESSMENT AND PLAN:  1. Chronic combined systolic and diastolic CHF:  Overall, appears euvolemic today. Will change lasix to 40 mg daily.  Titration of other CHF medications limited by  orthostatic hypotension in the past. He is now listed on both lisinopril and losartan. Will stop  lisinopril and increase losartan to 50 mg daily. Marland Kitchen He is using oxygen continually at home and has derived benefit from this. Will order a portable oxygen concentrator to help with his mobility.  Recommend continued use of oxygen and compression hose.   2. Cardiomyopathy, ischemic:  Last EF 35-40% by echocardiogram. Continue beta blocker, ARB. Intolerant of Entresto due to hypotension.  3. Coronary artery disease involving native coronary artery of native heart without angina pectoris:   He had a  heart catheterization demonstrating severe 2 vessel CAD with chronic occlusion of the obtuse marginal and RCA with unsuccessful attempt at CTO  PCI of the RCA. Continue aspirin, beta blocker, Plavix, statin. He is not a candidate for further revascularization.  4. Essential hypertension:  Controlled.  5. Orthostatic hypotension:  Continue  compression stockings to help with his orthostatic hypotension.   6. Hyperlipidemia:    continue statin.  7. ICD- BS Nov 2012 for VT: NSVT seen on follow up ICD check but no sustained episodes.  8. Type 2 diabetes mellitus with other circulatory complications:  Follow-up with primary care.  9. CKD (chronic kidney disease), class 3.  Will check a BMET today.  10. Back pain/cervical pain. He is a poor candidate for surgery with multiple co-morbidities and general poor health. Recommend conservative therapy.  11. Depression. Now on Cymbalta.     Medication Changes: Current medicines are reviewed at length with the patient today.  Concerns regarding medicines are as outlined above.  The following changes have been made:  See above  Labs/ tests ordered today include:   Orders  Placed This Encounter  Procedures  . Basic metabolic panel     Disposition:    FU with with Dr. Martinique in 4 months.     Signed, Quinlynn Cuthbert Martinique MD, [facility]    01/27/2017 1:03 PM

## 2017-01-27 ENCOUNTER — Ambulatory Visit (INDEPENDENT_AMBULATORY_CARE_PROVIDER_SITE_OTHER): Payer: Medicare Other | Admitting: Cardiology

## 2017-01-27 ENCOUNTER — Encounter: Payer: Self-pay | Admitting: Cardiology

## 2017-01-27 DIAGNOSIS — I2589 Other forms of chronic ischemic heart disease: Secondary | ICD-10-CM | POA: Diagnosis not present

## 2017-01-27 DIAGNOSIS — I255 Ischemic cardiomyopathy: Secondary | ICD-10-CM | POA: Diagnosis not present

## 2017-01-27 LAB — BASIC METABOLIC PANEL
BUN: 26 mg/dL — AB (ref 7–25)
CHLORIDE: 104 mmol/L (ref 98–110)
CO2: 29 mmol/L (ref 20–31)
CREATININE: 1.06 mg/dL (ref 0.70–1.18)
Calcium: 8.9 mg/dL (ref 8.6–10.3)
GLUCOSE: 221 mg/dL — AB (ref 65–99)
POTASSIUM: 5.2 mmol/L (ref 3.5–5.3)
Sodium: 140 mmol/L (ref 135–146)

## 2017-01-27 MED ORDER — CLOPIDOGREL BISULFATE 75 MG PO TABS: 75.0000 mg | ORAL_TABLET | Freq: Every day | ORAL | 3 refills | Status: DC

## 2017-01-27 MED ORDER — FUROSEMIDE 40 MG PO TABS: 40.0000 mg | ORAL_TABLET | Freq: Every day | ORAL | 3 refills | Status: DC

## 2017-01-27 MED ORDER — BISOPROLOL FUMARATE 5 MG PO TABS: 2.5000 mg | ORAL_TABLET | Freq: Every day | ORAL | 3 refills | Status: DC

## 2017-01-27 MED ORDER — LOSARTAN POTASSIUM 50 MG PO TABS: 50.0000 mg | ORAL_TABLET | Freq: Every day | ORAL | 3 refills | Status: DC

## 2017-01-27 NOTE — Patient Instructions (Signed)
Continue Zbeta 2.5 mg daily  Increase losartan to 50 mg daily  Increase lasix to 40 mg daily.   Do not take Entresto or lisinopril  We will check your kidney function  We will order a portable oxygen concentrator.

## 2017-01-30 DIAGNOSIS — J9611 Chronic respiratory failure with hypoxia: Secondary | ICD-10-CM | POA: Diagnosis not present

## 2017-01-30 DIAGNOSIS — E1149 Type 2 diabetes mellitus with other diabetic neurological complication: Secondary | ICD-10-CM | POA: Diagnosis not present

## 2017-01-30 DIAGNOSIS — F329 Major depressive disorder, single episode, unspecified: Secondary | ICD-10-CM | POA: Diagnosis not present

## 2017-01-30 DIAGNOSIS — Z79899 Other long term (current) drug therapy: Secondary | ICD-10-CM | POA: Diagnosis not present

## 2017-01-30 DIAGNOSIS — G4700 Insomnia, unspecified: Secondary | ICD-10-CM | POA: Diagnosis not present

## 2017-01-30 DIAGNOSIS — Z6828 Body mass index (BMI) 28.0-28.9, adult: Secondary | ICD-10-CM | POA: Diagnosis not present

## 2017-01-30 DIAGNOSIS — M5136 Other intervertebral disc degeneration, lumbar region: Secondary | ICD-10-CM | POA: Diagnosis not present

## 2017-02-02 ENCOUNTER — Telehealth: Payer: Self-pay | Admitting: Cardiology

## 2017-02-02 DIAGNOSIS — D649 Anemia, unspecified: Secondary | ICD-10-CM | POA: Diagnosis not present

## 2017-02-02 DIAGNOSIS — L03114 Cellulitis of left upper limb: Secondary | ICD-10-CM | POA: Diagnosis not present

## 2017-02-02 DIAGNOSIS — S60512S Abrasion of left hand, sequela: Secondary | ICD-10-CM | POA: Diagnosis not present

## 2017-02-02 NOTE — Telephone Encounter (Signed)
New Message ° ° ° °Returning phone call.  °

## 2017-02-03 NOTE — Telephone Encounter (Signed)
Returned call to patient's grandson [name] 02/02/17 lab results given.

## 2017-02-07 DIAGNOSIS — L03114 Cellulitis of left upper limb: Secondary | ICD-10-CM | POA: Diagnosis not present

## 2017-02-07 DIAGNOSIS — Z6827 Body mass index (BMI) 27.0-27.9, adult: Secondary | ICD-10-CM | POA: Diagnosis not present

## 2017-02-07 DIAGNOSIS — S41109A Unspecified open wound of unspecified upper arm, initial encounter: Secondary | ICD-10-CM | POA: Diagnosis not present

## 2017-02-24 ENCOUNTER — Encounter: Payer: Self-pay | Admitting: Cardiology

## 2017-03-27 ENCOUNTER — Other Ambulatory Visit: Payer: Self-pay | Admitting: Cardiology

## 2017-03-28 NOTE — Telephone Encounter (Signed)
Rx(s) sent to pharmacy electronically.  

## 2017-05-03 DIAGNOSIS — I2510 Atherosclerotic heart disease of native coronary artery without angina pectoris: Secondary | ICD-10-CM | POA: Diagnosis present

## 2017-05-03 DIAGNOSIS — Z95810 Presence of automatic (implantable) cardiac defibrillator: Secondary | ICD-10-CM | POA: Diagnosis not present

## 2017-05-03 DIAGNOSIS — M549 Dorsalgia, unspecified: Secondary | ICD-10-CM | POA: Diagnosis present

## 2017-05-03 DIAGNOSIS — J189 Pneumonia, unspecified organism: Secondary | ICD-10-CM | POA: Diagnosis not present

## 2017-05-03 DIAGNOSIS — Z951 Presence of aortocoronary bypass graft: Secondary | ICD-10-CM | POA: Diagnosis not present

## 2017-05-03 DIAGNOSIS — Z7982 Long term (current) use of aspirin: Secondary | ICD-10-CM | POA: Diagnosis not present

## 2017-05-03 DIAGNOSIS — I255 Ischemic cardiomyopathy: Secondary | ICD-10-CM | POA: Diagnosis present

## 2017-05-03 DIAGNOSIS — E1159 Type 2 diabetes mellitus with other circulatory complications: Secondary | ICD-10-CM | POA: Diagnosis present

## 2017-05-03 DIAGNOSIS — I440 Atrioventricular block, first degree: Secondary | ICD-10-CM | POA: Diagnosis present

## 2017-05-03 DIAGNOSIS — R0602 Shortness of breath: Secondary | ICD-10-CM | POA: Diagnosis not present

## 2017-05-03 DIAGNOSIS — I081 Rheumatic disorders of both mitral and tricuspid valves: Secondary | ICD-10-CM | POA: Diagnosis present

## 2017-05-03 DIAGNOSIS — R0902 Hypoxemia: Secondary | ICD-10-CM | POA: Diagnosis present

## 2017-05-03 DIAGNOSIS — I25810 Atherosclerosis of coronary artery bypass graft(s) without angina pectoris: Secondary | ICD-10-CM | POA: Diagnosis not present

## 2017-05-03 DIAGNOSIS — R918 Other nonspecific abnormal finding of lung field: Secondary | ICD-10-CM | POA: Diagnosis not present

## 2017-05-03 DIAGNOSIS — I110 Hypertensive heart disease with heart failure: Secondary | ICD-10-CM | POA: Diagnosis not present

## 2017-05-03 DIAGNOSIS — Z794 Long term (current) use of insulin: Secondary | ICD-10-CM | POA: Diagnosis not present

## 2017-05-03 DIAGNOSIS — J9610 Chronic respiratory failure, unspecified whether with hypoxia or hypercapnia: Secondary | ICD-10-CM | POA: Diagnosis not present

## 2017-05-03 DIAGNOSIS — M25559 Pain in unspecified hip: Secondary | ICD-10-CM | POA: Diagnosis not present

## 2017-05-03 DIAGNOSIS — I5043 Acute on chronic combined systolic (congestive) and diastolic (congestive) heart failure: Secondary | ICD-10-CM | POA: Diagnosis not present

## 2017-05-16 DIAGNOSIS — I25810 Atherosclerosis of coronary artery bypass graft(s) without angina pectoris: Secondary | ICD-10-CM | POA: Diagnosis not present

## 2017-05-16 DIAGNOSIS — Z09 Encounter for follow-up examination after completed treatment for conditions other than malignant neoplasm: Secondary | ICD-10-CM | POA: Diagnosis not present

## 2017-05-16 DIAGNOSIS — Z95810 Presence of automatic (implantable) cardiac defibrillator: Secondary | ICD-10-CM | POA: Diagnosis not present

## 2017-05-16 DIAGNOSIS — Z6824 Body mass index (BMI) 24.0-24.9, adult: Secondary | ICD-10-CM | POA: Diagnosis not present

## 2017-05-16 DIAGNOSIS — I5043 Acute on chronic combined systolic (congestive) and diastolic (congestive) heart failure: Secondary | ICD-10-CM | POA: Diagnosis not present

## 2017-05-16 DIAGNOSIS — E1149 Type 2 diabetes mellitus with other diabetic neurological complication: Secondary | ICD-10-CM | POA: Diagnosis not present

## 2017-05-16 DIAGNOSIS — M5136 Other intervertebral disc degeneration, lumbar region: Secondary | ICD-10-CM | POA: Diagnosis not present

## 2017-05-16 DIAGNOSIS — J9611 Chronic respiratory failure with hypoxia: Secondary | ICD-10-CM | POA: Diagnosis not present

## 2017-05-18 ENCOUNTER — Telehealth: Payer: Self-pay | Admitting: Cardiology

## 2017-05-18 DIAGNOSIS — T07XXXA Unspecified multiple injuries, initial encounter: Secondary | ICD-10-CM | POA: Diagnosis not present

## 2017-05-18 DIAGNOSIS — I6523 Occlusion and stenosis of bilateral carotid arteries: Secondary | ICD-10-CM | POA: Diagnosis not present

## 2017-05-18 DIAGNOSIS — J9611 Chronic respiratory failure with hypoxia: Secondary | ICD-10-CM | POA: Diagnosis not present

## 2017-05-18 DIAGNOSIS — G319 Degenerative disease of nervous system, unspecified: Secondary | ICD-10-CM | POA: Diagnosis not present

## 2017-05-18 DIAGNOSIS — Z79899 Other long term (current) drug therapy: Secondary | ICD-10-CM | POA: Diagnosis not present

## 2017-05-18 DIAGNOSIS — I509 Heart failure, unspecified: Secondary | ICD-10-CM | POA: Diagnosis not present

## 2017-05-18 DIAGNOSIS — I440 Atrioventricular block, first degree: Secondary | ICD-10-CM | POA: Diagnosis not present

## 2017-05-18 DIAGNOSIS — I110 Hypertensive heart disease with heart failure: Secondary | ICD-10-CM | POA: Diagnosis not present

## 2017-05-18 DIAGNOSIS — I5032 Chronic diastolic (congestive) heart failure: Secondary | ICD-10-CM | POA: Diagnosis not present

## 2017-05-18 DIAGNOSIS — R2689 Other abnormalities of gait and mobility: Secondary | ICD-10-CM | POA: Diagnosis not present

## 2017-05-18 DIAGNOSIS — M19072 Primary osteoarthritis, left ankle and foot: Secondary | ICD-10-CM | POA: Diagnosis not present

## 2017-05-18 DIAGNOSIS — M4312 Spondylolisthesis, cervical region: Secondary | ICD-10-CM | POA: Diagnosis not present

## 2017-05-18 DIAGNOSIS — R079 Chest pain, unspecified: Secondary | ICD-10-CM | POA: Diagnosis not present

## 2017-05-18 DIAGNOSIS — S80812A Abrasion, left lower leg, initial encounter: Secondary | ICD-10-CM | POA: Diagnosis not present

## 2017-05-18 DIAGNOSIS — E1165 Type 2 diabetes mellitus with hyperglycemia: Secondary | ICD-10-CM | POA: Diagnosis not present

## 2017-05-18 DIAGNOSIS — K8020 Calculus of gallbladder without cholecystitis without obstruction: Secondary | ICD-10-CM | POA: Diagnosis not present

## 2017-05-18 DIAGNOSIS — J479 Bronchiectasis, uncomplicated: Secondary | ICD-10-CM | POA: Diagnosis not present

## 2017-05-18 DIAGNOSIS — R531 Weakness: Secondary | ICD-10-CM | POA: Diagnosis not present

## 2017-05-18 DIAGNOSIS — E860 Dehydration: Secondary | ICD-10-CM | POA: Diagnosis not present

## 2017-05-18 DIAGNOSIS — Z043 Encounter for examination and observation following other accident: Secondary | ICD-10-CM | POA: Diagnosis not present

## 2017-05-18 DIAGNOSIS — M7732 Calcaneal spur, left foot: Secondary | ICD-10-CM | POA: Diagnosis not present

## 2017-05-18 DIAGNOSIS — S0990XA Unspecified injury of head, initial encounter: Secondary | ICD-10-CM | POA: Diagnosis not present

## 2017-05-18 DIAGNOSIS — E871 Hypo-osmolality and hyponatremia: Secondary | ICD-10-CM | POA: Diagnosis not present

## 2017-05-18 DIAGNOSIS — E118 Type 2 diabetes mellitus with unspecified complications: Secondary | ICD-10-CM | POA: Diagnosis not present

## 2017-05-18 DIAGNOSIS — R55 Syncope and collapse: Secondary | ICD-10-CM | POA: Diagnosis not present

## 2017-05-18 DIAGNOSIS — Z7984 Long term (current) use of oral hypoglycemic drugs: Secondary | ICD-10-CM | POA: Diagnosis not present

## 2017-05-18 DIAGNOSIS — M50322 Other cervical disc degeneration at C5-C6 level: Secondary | ICD-10-CM | POA: Diagnosis not present

## 2017-05-18 DIAGNOSIS — I10 Essential (primary) hypertension: Secondary | ICD-10-CM | POA: Diagnosis not present

## 2017-05-18 DIAGNOSIS — I5042 Chronic combined systolic (congestive) and diastolic (congestive) heart failure: Secondary | ICD-10-CM | POA: Diagnosis not present

## 2017-05-18 NOTE — Telephone Encounter (Signed)
New message    Victor [name] is calling asking for a call back from RN. He wouldn't say what it was, just that he needed to talk to nurse about pt.

## 2017-05-18 NOTE — Telephone Encounter (Signed)
Pt of Dr. Martinique  Spoke w patient who gave verbal assent to speak to grandson Victor [name] (not listed on DPR). Victor [name] explains pt has been having dizziness for a period of "months", thinks this is worse recently.  He had a recent 2-day stay at [facility] for associated SOB and had "fluid drawn off lungs" at that time.  Pt has had 2 falls recently. Grandson denies head injury assoc w these. States pt gets so weak and dizzy he can't stand up. Unsure of home BPs. Advised if further falls, esp w head injury, to consider ED evaluation d/t pt being on ASA & Plavix.  No openings on APP slots - pt preferred to see Dr. Martinique next available. He has already seen PCP this week (hospital f/u) but grandson was not at this appt, is not certain whether the dizziness was addressed at that appt.  Scheduled for July 3rd w reminder to go to ED if any urgent/worsening symptoms. Caller expressed understanding and thanks.

## 2017-05-19 DIAGNOSIS — I6523 Occlusion and stenosis of bilateral carotid arteries: Secondary | ICD-10-CM | POA: Diagnosis not present

## 2017-05-19 DIAGNOSIS — R55 Syncope and collapse: Secondary | ICD-10-CM | POA: Diagnosis not present

## 2017-05-19 DIAGNOSIS — I5042 Chronic combined systolic (congestive) and diastolic (congestive) heart failure: Secondary | ICD-10-CM | POA: Diagnosis not present

## 2017-05-19 DIAGNOSIS — J9611 Chronic respiratory failure with hypoxia: Secondary | ICD-10-CM | POA: Diagnosis not present

## 2017-05-19 DIAGNOSIS — T07XXXA Unspecified multiple injuries, initial encounter: Secondary | ICD-10-CM | POA: Diagnosis not present

## 2017-05-20 DIAGNOSIS — R55 Syncope and collapse: Secondary | ICD-10-CM | POA: Diagnosis not present

## 2017-05-20 DIAGNOSIS — J9611 Chronic respiratory failure with hypoxia: Secondary | ICD-10-CM | POA: Diagnosis not present

## 2017-05-20 DIAGNOSIS — I6523 Occlusion and stenosis of bilateral carotid arteries: Secondary | ICD-10-CM | POA: Diagnosis not present

## 2017-05-20 DIAGNOSIS — I5042 Chronic combined systolic (congestive) and diastolic (congestive) heart failure: Secondary | ICD-10-CM | POA: Diagnosis not present

## 2017-05-20 DIAGNOSIS — T07XXXA Unspecified multiple injuries, initial encounter: Secondary | ICD-10-CM | POA: Diagnosis not present

## 2017-05-21 NOTE — Progress Notes (Signed)
Cardiology Office Note   Date:  05/23/2017   ID:  Victor [name], DOB 1936/09/27, MRN [id number]  PCP:  Cyndi Bender, PA-C  Cardiologist:  Dr. Furious Chiarelli [place]   Electrophysiologist:  Dr. Cristopher Peru    Chief Complaint  Patient presents with  . Follow-up  . Headache  . Congestive Heart Failure     History of Present Illness: Victor [name] is a 81 y.o. male is seen for follow up CAD and CHF. He has a hx of CAD, status post CABG in 1990 with known occlusion of both vein grafts, PCI to the SVG-OM3 in 2014 and attempted CTO PCI of the RCA 09/2014. Chronic combined systolic and diastolic CHF, ischemic cardiomyopathy with history of VT in 2012, status post ICD, diabetes, CKD, HTN, HL, PAD, carotid stenosis.  Admitted 02/2015 with acute CHF exacerbation. He had new EKG changes and underwent LHC with unsuccessful repeat attempt at PCI of CTO RCA. Medical therapy was continued.  Admitted 8/7-06/30/15 with acute on chronic combined systolic and diastolic CHF. Patient has a history of orthostatic hypotension.   Follow-up echo demonstrated no significant change with an EF of 35-40%.   Seen by Kerin Ransom PA in November and December 2017 for titration of medications. 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 At one time they had 20 horses,  now down to 5.  The pt's wife was  killed in a MVA last year. He now lives alone but has family help. Attempted to get him on Entresto  09/23/16 but unable to tolerate due to hypotension. He also has chronic combined systolic + diastolic CHF with EF of 87% by echo Aug 2016. He had VT in 2012 and is S/p BS ICD followed by Dr Lovena Le.  When last seen his aldactone and lisinopril were held due to hypotension. Later started on low dose lisinopril.  He was recently admitted to [facility] on 6/13-6/14/18 with CHF exacerbation. CXR showed interstitial edema. Pro BNP > 6000. Echo showed stable EF 35%- akinesis of the inferior and posterior wall, mild to  mod MR. Pulmonary HTN with estimated RV systolic pressure of 44 mm Hg. He was treated with IV lasix with improvement and discharged the following day on prior regimen. Hbg was 10.1.  He was then admitted to the hospital in [place] this past week on Thursday after falling at home. Hit his head hard on the floor. Had a scalp hematoma and abrasion left face and ear. Injured his arm and foot but no fracture. Reports CT of head, neck and spine were OK. Discharged on Sunday. Doesn't recall events prior to fall. Remembers getting up to go to [place]. Next thing he knew son found him on sofa with blood all around. In hospital his insulin was stopped. Other meds continued.  On follow up today he states he complains of pain in neck and spasms. Denies SOB today. States it comes and goes but doesn't last. No chest pain or palpitations. Denies dizziness. He has lost 22 lbs since last visit here. Now followed by primary care in [place].    Studies/Reports Reviewed Today:  Hospital records from [place] care as noted above.   Echo 06/29/15 Mild LVH, EF 35-40%, inferior HK, mild MR, mild LAE, PASP 42 mmHg Impressions: - Compared to the prior study, there has been no significantinterval change.  Last device check in March showed NSVT- longest 9 beats.  Past Medical History:  Diagnosis Date  . Anemia   .  Arthritis   . Automatic implantable cardioverter-defibrillator in situ   . Cardiomyopathy, ischemic    a. s/p ICD placement  . Chronic combined systolic and diastolic CHF (congestive heart failure) ([place])   . Chronic lower back pain   . CKD (chronic kidney disease), stage III   . Coronary artery disease    a. s/p CABG 1990. b. multiple PCIs since that time including CTO angioplasty (without stenting) PCI of RCA 09/2014, with last cath 02/2015 done for worsening CHF with unsuccessful attempt at PCI of the RCA due to inability to cross occlusion - medical therapy advised at that time.  Marland Kitchen GERD (gastroesophageal  reflux disease)   . HLD (hyperlipidemia)   . Hypertension   . Occlusion and stenosis of carotid artery without mention of cerebral infarction   . Orthostatic hypotension   . Paroxysmal ventricular tachycardia ([place])   . Peripheral vascular disease ([place])   . Raynaud's syndrome   . Stroke [facility])    a. CT head 06/2015: Small old lacunar infarct LEFT basal ganglia.  . Thrombocytopenia ([place])   . TIA (transient ischemic attack)    a. s/p LHC on 10/01/14   . Type II diabetes mellitus ([place])     Past Surgical History:  Procedure Laterality Date  . ANAL FISTULECTOMY  2000's  . CARDIAC CATHETERIZATION  07/2014  . CARDIAC CATHETERIZATION  10/01/2014   Procedure: CORONARY BALLOON ANGIOPLASTY;  Surgeon: Thresea Doble M [place], MD;  Location: [facility] CATH LAB;  Service: Cardiovascular;;  . CARDIAC DEFIBRILLATOR PLACEMENT  02/1989-12/2011   "I've had a total of 6 or 7 defibrillators put in" (09/30/2014)  . CATARACT EXTRACTION, BILATERAL Bilateral ~ 2009  . CORONARY ANGIOPLASTY  10/01/14   PTVA to CTO with restoration of TIMI 3  . CORONARY ANGIOPLASTY WITH STENT PLACEMENT  04/2013   "1"  . CORONARY ARTERY BYPASS GRAFT  01/1989   "CABG X3"   . FINGER FRACTURE SURGERY Left ~ 2000   "pointer"  . FRACTURE SURGERY    . IMPLANTABLE CARDIOVERTER DEFIBRILLATOR (ICD) GENERATOR CHANGE Left 10/05/2011   Procedure: ICD GENERATOR CHANGE;  Surgeon: Evans Lance, MD;  Location: [facility] CATH LAB;  Service: Cardiovascular;  Laterality: Left;  . LEFT AND RIGHT HEART CATHETERIZATION WITH CORONARY/GRAFT ANGIOGRAM N/A 04/26/2013   Procedure: LEFT AND RIGHT HEART CATHETERIZATION WITH Beatrix Fetters;  Surgeon: Sherren Mocha, MD;  Location: [facility] CATH LAB;  Service: Cardiovascular;  Laterality: N/A;  . LEFT HEART CATHETERIZATION WITH CORONARY ANGIOGRAM N/A 08/01/2014   Procedure: LEFT HEART CATHETERIZATION WITH CORONARY ANGIOGRAM;  Surgeon: Sinclair Grooms, MD;  Location: [facility] CATH LAB;  Service: Cardiovascular;  Laterality: N/A;  .  LEFT HEART CATHETERIZATION WITH CORONARY ANGIOGRAM N/A 03/20/2015   Procedure: LEFT HEART CATHETERIZATION WITH CORONARY ANGIOGRAM;  Surgeon: Massie Cogliano M [place], MD;  Location: [facility] CATH LAB;  Service: Cardiovascular;  Laterality: N/A;  . PERCUTANEOUS STENT INTERVENTION  04/26/2013   Procedure: PERCUTANEOUS STENT INTERVENTION;  Surgeon: Sherren Mocha, MD;  Location: [facility] CATH LAB;  Service: Cardiovascular;;  . RIGHT HEART CATHETERIZATION  10/01/2014   Procedure: RIGHT HEART CATH;  Surgeon: Sheila Ocasio M [place], MD;  Location: [facility] CATH LAB;  Service: Cardiovascular;;     Current Outpatient Prescriptions  Medication Sig Dispense Refill  . aspirin (ASPIR-81) 81 MG EC tablet Take 81 mg by mouth at bedtime.     . bisoprolol (ZEBETA) 5 MG tablet Take 0.5 tablets (2.5 mg total) by mouth daily. 90 tablet 3  . clopidogrel (PLAVIX) 75 MG tablet Take 1 tablet (  75 mg total) by mouth daily. 90 tablet 3  . dexamethasone (DECADRON) 4 MG tablet Take 4 mg by mouth daily as needed (back pain).   0  . DULoxetine (CYMBALTA) 30 MG capsule Take 30 mg by mouth daily. Take one capsule by mouth daily for 1 week then increase to 2 capsules daily.     . furosemide (LASIX) 40 MG tablet Take 1 tablet (40 mg total) by mouth daily. 90 tablet 3  . HYDROcodone-acetaminophen (NORCO) 10-325 MG per tablet Take 1 tablet by mouth every 8 (eight) hours as needed for moderate pain.   0  . LEVEMIR FLEXTOUCH 100 UNIT/ML Pen INJECT 30 UNITS UNDER SKIN EACH MORNING  1  . losartan (COZAAR) 50 MG tablet Take 0.5 tablets (25 mg total) by mouth daily. 90 tablet 3  . LYRICA 150 MG capsule Take 150 mg by mouth daily.   1  . metFORMIN (GLUCOPHAGE) 1000 MG tablet Take 1,000 mg by mouth 2 (two) times daily with a meal.    . mupirocin ointment (BACTROBAN) 2 % Apply 1 application topically 2 (two) times daily.  3  . nitroGLYCERIN (NITROSTAT) 0.4 MG SL tablet Place 0.4 mg under the tongue every 5 (five) minutes as needed for chest pain.    Marland Kitchen omeprazole (PRILOSEC)  20 MG capsule TAKE 1 CAPSULE BY MOUTH ONCE DAILY 60 capsule 10  . ONE TOUCH ULTRA TEST test strip CHECK SUGAR AS DIRECTED 3 TIMES DAILY  5  . pravastatin (PRAVACHOL) 40 MG tablet Take 1 tablet (40 mg total) by mouth at bedtime. 90 tablet 3  . SANTYL ointment Apply 1 application topically every other day.  2  . traZODone (DESYREL) 50 MG tablet Take 1 tablet by mouth at bedtime.  1  . vitamin B-12 (CYANOCOBALAMIN) 1000 MCG tablet Take 1,000 mcg by mouth daily.     No current facility-administered medications for this visit.     Allergies:   Coreg [carvedilol]; Lovastatin; and Ativan [lorazepam]    Social History:  The patient  reports that he has quit smoking. His smoking use included Pipe. He quit after 3.00 years of use. He has never used smokeless tobacco. He reports that he drinks alcohol. He reports that he does not use drugs.   Family History:  The patient's family history includes Heart attack in his father; Heart failure (age of onset: 26) in his mother; Leukemia in his sister; Stomach cancer in his maternal grandfather.    ROS:   Please see the history of present illness.   Review of Systems  Constitution: Positive for decreased appetite, malaise/fatigue and weight loss.  Cardiovascular: Positive for dyspnea on exertion. Negative for leg swelling.  Respiratory: Positive for shortness of breath. Negative for wheezing.   Musculoskeletal: Positive for neck pain. Negative for muscle weakness.  Gastrointestinal: Negative for bloating.  Neurological: Negative for loss of balance.  All other systems reviewed and are negative.    PHYSICAL EXAM: VS:  BP 116/70   Pulse 72   Ht 5\' 8"  (1.727 m)   Wt 157 lb (71.2 kg)   BMI 23.87 kg/m     Wt Readings from Last 3 Encounters:  05/23/17 157 lb (71.2 kg)  01/27/17 179 lb 9.6 oz (81.5 kg)  11/25/16 174 lb (78.9 kg)     GEN: chronically ill appearing, in no acute distress  HEENT: hematoma posterior left scalp. Abrasion left face and  ear. Neck: no JVD,   no masses Cardiac:  Normal S1/S2, RRR; no murmur ,  no rubs or gallops, No edema Respiratory:  Decreased breath sounds bilaterally, no wheezing, rhonchi or rales. GI: soft, nontender, nondistended, + BS MS: no deformity or atrophy  Skin: warm and dry  Neuro:  CNs II-XII intact, Strength and sensation are intact Psych: Normal affect   EKG:  EKG is not ordered today.   Recent Labs: 09/16/2016: Hemoglobin 9.9; Platelets 222 01/27/2017: BUN 26; Creat 1.06; Potassium 5.2; Sodium 140  05/18/17: A1c 10.5. CMET normal.   Lipid Panel    Component Value Date/Time   CHOL 127 06/29/2015 0508   TRIG 111 06/29/2015 0508   HDL 36 (L) 06/29/2015 0508   CHOLHDL 3.5 06/29/2015 0508   VLDL 22 06/29/2015 0508   LDLCALC 69 06/29/2015 0508    Labs dated 05/12/16: cholesterol 127, triglycerides 166, HDL 48, LDL 46.  Dated 08/10/16: A1c 7.7%.   ASSESSMENT AND PLAN:  1. Chronic combined systolic and diastolic CHF:  Overall, appears euvolemic today. Will continue lasix 40 mg daily.  Titration of other CHF medications limited by  orthostatic hypotension. I am concerned that recent fall may have been related to orthostasis. Will reduce losartan to 25 mg daily. He is using oxygen continually at home.   2. Cardiomyopathy, ischemic:  Last EF 35-40% by echocardiogram. Continue beta blocker, ARB. Intolerant of Entresto due to hypotension.  3. Coronary artery disease involving native coronary artery of native heart without angina pectoris:   He had a  heart catheterization demonstrating severe 2 vessel CAD with chronic occlusion of the obtuse marginal and RCA with unsuccessful attempt at CTO  PCI of the RCA. Continue aspirin, beta blocker, Plavix, statin. He is not a candidate for further revascularization.  4. Essential hypertension:  Controlled.  5. Orthostatic hypotension:  Continue  compression stockings to help with his orthostatic hypotension. Adjustment in meds as noted above.   6.  Hyperlipidemia:    continue statin.  7. ICD- BS Nov 2012 for VT: will reintterogate ICD to make sure no significant arrhythmia related to recent fall.  8. Type 2 diabetes mellitus with other circulatory complications:  Follow-up with primary care.  9. CKD (chronic kidney disease), class 3.    10. Back pain/cervical pain. He is a poor candidate for surgery with multiple co-morbidities and general poor health. Recommend conservative therapy.  Will request recent hospital records from [place]. Victor [name] appears to be in declining health with more functional limitation. Will arrange follow up in 6 weeks.      Medication Changes: Current medicines are reviewed at length with the patient today.  Concerns regarding medicines are as outlined above.  The following changes have been made:  See above  Labs/ tests ordered today include:   No orders of the defined types were placed in this encounter.    Signed, Bernie Ransford [place] MD, [facility]    05/23/2017 4:53 PM

## 2017-05-21 NOTE — Telephone Encounter (Signed)
Has appointment to see me Tuesday July 2.  Dhyana Bastone [place] MD, [facility]

## 2017-05-23 ENCOUNTER — Encounter: Payer: Self-pay | Admitting: Cardiology

## 2017-05-23 ENCOUNTER — Ambulatory Visit (INDEPENDENT_AMBULATORY_CARE_PROVIDER_SITE_OTHER): Payer: Medicare Other | Admitting: Cardiology

## 2017-05-23 VITALS — BP 116/70 | HR 72 | Ht 68.0 in | Wt 157.0 lb

## 2017-05-23 DIAGNOSIS — I951 Orthostatic hypotension: Secondary | ICD-10-CM | POA: Diagnosis not present

## 2017-05-23 DIAGNOSIS — I209 Angina pectoris, unspecified: Secondary | ICD-10-CM | POA: Diagnosis not present

## 2017-05-23 DIAGNOSIS — N183 Chronic kidney disease, stage 3 unspecified: Secondary | ICD-10-CM

## 2017-05-23 DIAGNOSIS — I5022 Chronic systolic (congestive) heart failure: Secondary | ICD-10-CM

## 2017-05-23 DIAGNOSIS — Z95810 Presence of automatic (implantable) cardiac defibrillator: Secondary | ICD-10-CM | POA: Diagnosis not present

## 2017-05-23 DIAGNOSIS — I25118 Atherosclerotic heart disease of native coronary artery with other forms of angina pectoris: Secondary | ICD-10-CM | POA: Diagnosis not present

## 2017-05-23 DIAGNOSIS — Z951 Presence of aortocoronary bypass graft: Secondary | ICD-10-CM

## 2017-05-23 DIAGNOSIS — I255 Ischemic cardiomyopathy: Secondary | ICD-10-CM

## 2017-05-23 MED ORDER — LOSARTAN POTASSIUM 50 MG PO TABS: 25.0000 mg | ORAL_TABLET | Freq: Every day | ORAL | 3 refills | Status: DC

## 2017-05-23 NOTE — Patient Instructions (Signed)
Reduce losartan to 25 mg daily  We will arrange to check your ICD  Continue your other therapy   We will follow up in 6 weeks.

## 2017-05-26 ENCOUNTER — Telehealth: Payer: Self-pay

## 2017-05-26 NOTE — Telephone Encounter (Signed)
Spoke to patient he requested order to get a smaller light weight O2 tank.[facility] advised patient will have to call for appointment.

## 2017-05-29 DIAGNOSIS — G8929 Other chronic pain: Secondary | ICD-10-CM | POA: Diagnosis not present

## 2017-05-29 DIAGNOSIS — R55 Syncope and collapse: Secondary | ICD-10-CM | POA: Diagnosis not present

## 2017-05-29 DIAGNOSIS — I6523 Occlusion and stenosis of bilateral carotid arteries: Secondary | ICD-10-CM | POA: Diagnosis not present

## 2017-05-29 DIAGNOSIS — J9611 Chronic respiratory failure with hypoxia: Secondary | ICD-10-CM | POA: Diagnosis not present

## 2017-05-29 DIAGNOSIS — I499 Cardiac arrhythmia, unspecified: Secondary | ICD-10-CM | POA: Diagnosis not present

## 2017-05-29 DIAGNOSIS — M5441 Lumbago with sciatica, right side: Secondary | ICD-10-CM | POA: Diagnosis not present

## 2017-05-29 DIAGNOSIS — R296 Repeated falls: Secondary | ICD-10-CM | POA: Diagnosis not present

## 2017-05-29 DIAGNOSIS — R112 Nausea with vomiting, unspecified: Secondary | ICD-10-CM | POA: Diagnosis not present

## 2017-05-29 DIAGNOSIS — M5442 Lumbago with sciatica, left side: Secondary | ICD-10-CM | POA: Diagnosis not present

## 2017-05-29 DIAGNOSIS — E118 Type 2 diabetes mellitus with unspecified complications: Secondary | ICD-10-CM | POA: Diagnosis not present

## 2017-05-29 DIAGNOSIS — Z95810 Presence of automatic (implantable) cardiac defibrillator: Secondary | ICD-10-CM | POA: Diagnosis not present

## 2017-05-29 DIAGNOSIS — I10 Essential (primary) hypertension: Secondary | ICD-10-CM | POA: Diagnosis not present

## 2017-05-29 DIAGNOSIS — R531 Weakness: Secondary | ICD-10-CM | POA: Diagnosis not present

## 2017-05-29 DIAGNOSIS — I5042 Chronic combined systolic (congestive) and diastolic (congestive) heart failure: Secondary | ICD-10-CM | POA: Diagnosis not present

## 2017-05-29 DIAGNOSIS — S51819A Laceration without foreign body of unspecified forearm, initial encounter: Secondary | ICD-10-CM | POA: Diagnosis not present

## 2017-06-01 ENCOUNTER — Ambulatory Visit (INDEPENDENT_AMBULATORY_CARE_PROVIDER_SITE_OTHER): Payer: Medicare Other | Admitting: *Deleted

## 2017-06-01 DIAGNOSIS — I255 Ischemic cardiomyopathy: Secondary | ICD-10-CM | POA: Diagnosis not present

## 2017-06-05 NOTE — Progress Notes (Signed)
Remote ICD transmission.   

## 2017-06-06 ENCOUNTER — Encounter: Payer: Self-pay | Admitting: Cardiology

## 2017-06-06 DIAGNOSIS — Z95810 Presence of automatic (implantable) cardiac defibrillator: Secondary | ICD-10-CM | POA: Diagnosis not present

## 2017-06-06 DIAGNOSIS — Z8673 Personal history of transient ischemic attack (TIA), and cerebral infarction without residual deficits: Secondary | ICD-10-CM | POA: Diagnosis not present

## 2017-06-06 DIAGNOSIS — I509 Heart failure, unspecified: Secondary | ICD-10-CM | POA: Diagnosis not present

## 2017-06-06 DIAGNOSIS — S0081XA Abrasion of other part of head, initial encounter: Secondary | ICD-10-CM | POA: Diagnosis not present

## 2017-06-06 DIAGNOSIS — R600 Localized edema: Secondary | ICD-10-CM | POA: Diagnosis not present

## 2017-06-06 DIAGNOSIS — S0990XA Unspecified injury of head, initial encounter: Secondary | ICD-10-CM | POA: Diagnosis not present

## 2017-06-06 DIAGNOSIS — R2242 Localized swelling, mass and lump, left lower limb: Secondary | ICD-10-CM | POA: Diagnosis not present

## 2017-06-06 DIAGNOSIS — M160 Bilateral primary osteoarthritis of hip: Secondary | ICD-10-CM | POA: Diagnosis not present

## 2017-06-06 DIAGNOSIS — R531 Weakness: Secondary | ICD-10-CM | POA: Diagnosis not present

## 2017-06-06 DIAGNOSIS — J449 Chronic obstructive pulmonary disease, unspecified: Secondary | ICD-10-CM | POA: Diagnosis not present

## 2017-06-06 DIAGNOSIS — S79912A Unspecified injury of left hip, initial encounter: Secondary | ICD-10-CM | POA: Diagnosis not present

## 2017-06-06 DIAGNOSIS — R42 Dizziness and giddiness: Secondary | ICD-10-CM | POA: Diagnosis not present

## 2017-06-06 DIAGNOSIS — Z79899 Other long term (current) drug therapy: Secondary | ICD-10-CM | POA: Diagnosis not present

## 2017-06-06 DIAGNOSIS — R296 Repeated falls: Secondary | ICD-10-CM | POA: Diagnosis not present

## 2017-06-06 DIAGNOSIS — E1151 Type 2 diabetes mellitus with diabetic peripheral angiopathy without gangrene: Secondary | ICD-10-CM | POA: Diagnosis not present

## 2017-06-06 DIAGNOSIS — G319 Degenerative disease of nervous system, unspecified: Secondary | ICD-10-CM | POA: Diagnosis not present

## 2017-06-06 DIAGNOSIS — M79605 Pain in left leg: Secondary | ICD-10-CM | POA: Diagnosis not present

## 2017-06-06 DIAGNOSIS — R9082 White matter disease, unspecified: Secondary | ICD-10-CM | POA: Diagnosis not present

## 2017-06-06 DIAGNOSIS — N179 Acute kidney failure, unspecified: Secondary | ICD-10-CM | POA: Diagnosis not present

## 2017-06-06 DIAGNOSIS — Z7982 Long term (current) use of aspirin: Secondary | ICD-10-CM | POA: Diagnosis not present

## 2017-06-06 DIAGNOSIS — I429 Cardiomyopathy, unspecified: Secondary | ICD-10-CM | POA: Diagnosis not present

## 2017-06-06 DIAGNOSIS — Z7984 Long term (current) use of oral hypoglycemic drugs: Secondary | ICD-10-CM | POA: Diagnosis not present

## 2017-06-06 DIAGNOSIS — M7989 Other specified soft tissue disorders: Secondary | ICD-10-CM | POA: Diagnosis not present

## 2017-06-06 DIAGNOSIS — I110 Hypertensive heart disease with heart failure: Secondary | ICD-10-CM | POA: Diagnosis not present

## 2017-06-07 DIAGNOSIS — N179 Acute kidney failure, unspecified: Secondary | ICD-10-CM | POA: Diagnosis not present

## 2017-06-08 DIAGNOSIS — R2689 Other abnormalities of gait and mobility: Secondary | ICD-10-CM | POA: Diagnosis not present

## 2017-06-08 DIAGNOSIS — R42 Dizziness and giddiness: Secondary | ICD-10-CM | POA: Diagnosis not present

## 2017-06-08 DIAGNOSIS — Z9181 History of falling: Secondary | ICD-10-CM | POA: Diagnosis not present

## 2017-06-08 DIAGNOSIS — R1312 Dysphagia, oropharyngeal phase: Secondary | ICD-10-CM | POA: Diagnosis not present

## 2017-06-08 DIAGNOSIS — R2681 Unsteadiness on feet: Secondary | ICD-10-CM | POA: Diagnosis not present

## 2017-06-08 DIAGNOSIS — R55 Syncope and collapse: Secondary | ICD-10-CM | POA: Diagnosis not present

## 2017-06-09 DIAGNOSIS — R2689 Other abnormalities of gait and mobility: Secondary | ICD-10-CM | POA: Diagnosis not present

## 2017-06-09 DIAGNOSIS — R42 Dizziness and giddiness: Secondary | ICD-10-CM | POA: Diagnosis not present

## 2017-06-09 DIAGNOSIS — R1312 Dysphagia, oropharyngeal phase: Secondary | ICD-10-CM | POA: Diagnosis not present

## 2017-06-09 DIAGNOSIS — R2681 Unsteadiness on feet: Secondary | ICD-10-CM | POA: Diagnosis not present

## 2017-06-09 DIAGNOSIS — Z9181 History of falling: Secondary | ICD-10-CM | POA: Diagnosis not present

## 2017-06-09 DIAGNOSIS — R55 Syncope and collapse: Secondary | ICD-10-CM | POA: Diagnosis not present

## 2017-06-12 DIAGNOSIS — R2689 Other abnormalities of gait and mobility: Secondary | ICD-10-CM | POA: Diagnosis not present

## 2017-06-12 DIAGNOSIS — Z9181 History of falling: Secondary | ICD-10-CM | POA: Diagnosis not present

## 2017-06-12 DIAGNOSIS — R55 Syncope and collapse: Secondary | ICD-10-CM | POA: Diagnosis not present

## 2017-06-12 DIAGNOSIS — R42 Dizziness and giddiness: Secondary | ICD-10-CM | POA: Diagnosis not present

## 2017-06-12 DIAGNOSIS — R1312 Dysphagia, oropharyngeal phase: Secondary | ICD-10-CM | POA: Diagnosis not present

## 2017-06-12 DIAGNOSIS — R2681 Unsteadiness on feet: Secondary | ICD-10-CM | POA: Diagnosis not present

## 2017-06-13 DIAGNOSIS — Z9181 History of falling: Secondary | ICD-10-CM | POA: Diagnosis not present

## 2017-06-13 DIAGNOSIS — R2689 Other abnormalities of gait and mobility: Secondary | ICD-10-CM | POA: Diagnosis not present

## 2017-06-13 DIAGNOSIS — R42 Dizziness and giddiness: Secondary | ICD-10-CM | POA: Diagnosis not present

## 2017-06-13 DIAGNOSIS — R1312 Dysphagia, oropharyngeal phase: Secondary | ICD-10-CM | POA: Diagnosis not present

## 2017-06-13 DIAGNOSIS — R2681 Unsteadiness on feet: Secondary | ICD-10-CM | POA: Diagnosis not present

## 2017-06-13 DIAGNOSIS — R55 Syncope and collapse: Secondary | ICD-10-CM | POA: Diagnosis not present

## 2017-06-14 DIAGNOSIS — R2689 Other abnormalities of gait and mobility: Secondary | ICD-10-CM | POA: Diagnosis not present

## 2017-06-14 DIAGNOSIS — Z9181 History of falling: Secondary | ICD-10-CM | POA: Diagnosis not present

## 2017-06-14 DIAGNOSIS — R55 Syncope and collapse: Secondary | ICD-10-CM | POA: Diagnosis not present

## 2017-06-14 DIAGNOSIS — R1312 Dysphagia, oropharyngeal phase: Secondary | ICD-10-CM | POA: Diagnosis not present

## 2017-06-14 DIAGNOSIS — R42 Dizziness and giddiness: Secondary | ICD-10-CM | POA: Diagnosis not present

## 2017-06-14 DIAGNOSIS — R2681 Unsteadiness on feet: Secondary | ICD-10-CM | POA: Diagnosis not present

## 2017-06-15 DIAGNOSIS — R109 Unspecified abdominal pain: Secondary | ICD-10-CM | POA: Diagnosis not present

## 2017-06-16 ENCOUNTER — Encounter: Payer: Self-pay | Admitting: *Deleted

## 2017-06-16 DIAGNOSIS — Z9181 History of falling: Secondary | ICD-10-CM | POA: Diagnosis not present

## 2017-06-16 DIAGNOSIS — R2689 Other abnormalities of gait and mobility: Secondary | ICD-10-CM | POA: Diagnosis not present

## 2017-06-16 DIAGNOSIS — R2681 Unsteadiness on feet: Secondary | ICD-10-CM | POA: Diagnosis not present

## 2017-06-16 DIAGNOSIS — R55 Syncope and collapse: Secondary | ICD-10-CM | POA: Diagnosis not present

## 2017-06-16 DIAGNOSIS — I10 Essential (primary) hypertension: Secondary | ICD-10-CM | POA: Diagnosis not present

## 2017-06-16 DIAGNOSIS — R1312 Dysphagia, oropharyngeal phase: Secondary | ICD-10-CM | POA: Diagnosis not present

## 2017-06-16 DIAGNOSIS — R42 Dizziness and giddiness: Secondary | ICD-10-CM | POA: Diagnosis not present

## 2017-06-19 DIAGNOSIS — R2681 Unsteadiness on feet: Secondary | ICD-10-CM | POA: Diagnosis not present

## 2017-06-19 DIAGNOSIS — R2689 Other abnormalities of gait and mobility: Secondary | ICD-10-CM | POA: Diagnosis not present

## 2017-06-19 DIAGNOSIS — Z9181 History of falling: Secondary | ICD-10-CM | POA: Diagnosis not present

## 2017-06-19 DIAGNOSIS — R55 Syncope and collapse: Secondary | ICD-10-CM | POA: Diagnosis not present

## 2017-06-19 DIAGNOSIS — R1312 Dysphagia, oropharyngeal phase: Secondary | ICD-10-CM | POA: Diagnosis not present

## 2017-06-19 DIAGNOSIS — R42 Dizziness and giddiness: Secondary | ICD-10-CM | POA: Diagnosis not present

## 2017-06-20 DIAGNOSIS — R1312 Dysphagia, oropharyngeal phase: Secondary | ICD-10-CM | POA: Diagnosis not present

## 2017-06-20 DIAGNOSIS — R55 Syncope and collapse: Secondary | ICD-10-CM | POA: Diagnosis not present

## 2017-06-20 DIAGNOSIS — R42 Dizziness and giddiness: Secondary | ICD-10-CM | POA: Diagnosis not present

## 2017-06-20 DIAGNOSIS — R2681 Unsteadiness on feet: Secondary | ICD-10-CM | POA: Diagnosis not present

## 2017-06-20 DIAGNOSIS — Z9181 History of falling: Secondary | ICD-10-CM | POA: Diagnosis not present

## 2017-06-20 DIAGNOSIS — R2689 Other abnormalities of gait and mobility: Secondary | ICD-10-CM | POA: Diagnosis not present

## 2017-06-21 DIAGNOSIS — F329 Major depressive disorder, single episode, unspecified: Secondary | ICD-10-CM | POA: Diagnosis not present

## 2017-06-21 DIAGNOSIS — R635 Abnormal weight gain: Secondary | ICD-10-CM | POA: Diagnosis not present

## 2017-06-21 DIAGNOSIS — R2689 Other abnormalities of gait and mobility: Secondary | ICD-10-CM | POA: Diagnosis not present

## 2017-06-21 DIAGNOSIS — R55 Syncope and collapse: Secondary | ICD-10-CM | POA: Diagnosis not present

## 2017-06-21 DIAGNOSIS — R2681 Unsteadiness on feet: Secondary | ICD-10-CM | POA: Diagnosis not present

## 2017-06-21 DIAGNOSIS — Z9181 History of falling: Secondary | ICD-10-CM | POA: Diagnosis not present

## 2017-06-21 DIAGNOSIS — I5030 Unspecified diastolic (congestive) heart failure: Secondary | ICD-10-CM | POA: Diagnosis not present

## 2017-06-21 DIAGNOSIS — R54 Age-related physical debility: Secondary | ICD-10-CM | POA: Diagnosis not present

## 2017-06-21 DIAGNOSIS — M6281 Muscle weakness (generalized): Secondary | ICD-10-CM | POA: Diagnosis not present

## 2017-06-21 DIAGNOSIS — R1312 Dysphagia, oropharyngeal phase: Secondary | ICD-10-CM | POA: Diagnosis not present

## 2017-06-22 DIAGNOSIS — M6281 Muscle weakness (generalized): Secondary | ICD-10-CM | POA: Diagnosis not present

## 2017-06-22 DIAGNOSIS — R2681 Unsteadiness on feet: Secondary | ICD-10-CM | POA: Diagnosis not present

## 2017-06-22 DIAGNOSIS — Z9181 History of falling: Secondary | ICD-10-CM | POA: Diagnosis not present

## 2017-06-22 DIAGNOSIS — R1312 Dysphagia, oropharyngeal phase: Secondary | ICD-10-CM | POA: Diagnosis not present

## 2017-06-22 DIAGNOSIS — R2689 Other abnormalities of gait and mobility: Secondary | ICD-10-CM | POA: Diagnosis not present

## 2017-06-22 DIAGNOSIS — R55 Syncope and collapse: Secondary | ICD-10-CM | POA: Diagnosis not present

## 2017-06-23 DIAGNOSIS — R55 Syncope and collapse: Secondary | ICD-10-CM | POA: Diagnosis not present

## 2017-06-23 DIAGNOSIS — M6281 Muscle weakness (generalized): Secondary | ICD-10-CM | POA: Diagnosis not present

## 2017-06-23 DIAGNOSIS — R1312 Dysphagia, oropharyngeal phase: Secondary | ICD-10-CM | POA: Diagnosis not present

## 2017-06-23 DIAGNOSIS — R2681 Unsteadiness on feet: Secondary | ICD-10-CM | POA: Diagnosis not present

## 2017-06-23 DIAGNOSIS — Z9181 History of falling: Secondary | ICD-10-CM | POA: Diagnosis not present

## 2017-06-23 DIAGNOSIS — R2689 Other abnormalities of gait and mobility: Secondary | ICD-10-CM | POA: Diagnosis not present

## 2017-06-26 DIAGNOSIS — R1312 Dysphagia, oropharyngeal phase: Secondary | ICD-10-CM | POA: Diagnosis not present

## 2017-06-26 DIAGNOSIS — M6281 Muscle weakness (generalized): Secondary | ICD-10-CM | POA: Diagnosis not present

## 2017-06-26 DIAGNOSIS — R2681 Unsteadiness on feet: Secondary | ICD-10-CM | POA: Diagnosis not present

## 2017-06-26 DIAGNOSIS — R2689 Other abnormalities of gait and mobility: Secondary | ICD-10-CM | POA: Diagnosis not present

## 2017-06-26 DIAGNOSIS — I5030 Unspecified diastolic (congestive) heart failure: Secondary | ICD-10-CM | POA: Diagnosis not present

## 2017-06-26 DIAGNOSIS — R55 Syncope and collapse: Secondary | ICD-10-CM | POA: Diagnosis not present

## 2017-06-26 DIAGNOSIS — Z9181 History of falling: Secondary | ICD-10-CM | POA: Diagnosis not present

## 2017-06-26 DIAGNOSIS — R269 Unspecified abnormalities of gait and mobility: Secondary | ICD-10-CM | POA: Diagnosis not present

## 2017-06-27 DIAGNOSIS — R2689 Other abnormalities of gait and mobility: Secondary | ICD-10-CM | POA: Diagnosis not present

## 2017-06-27 DIAGNOSIS — Z9181 History of falling: Secondary | ICD-10-CM | POA: Diagnosis not present

## 2017-06-27 DIAGNOSIS — R2681 Unsteadiness on feet: Secondary | ICD-10-CM | POA: Diagnosis not present

## 2017-06-27 DIAGNOSIS — R55 Syncope and collapse: Secondary | ICD-10-CM | POA: Diagnosis not present

## 2017-06-27 DIAGNOSIS — M6281 Muscle weakness (generalized): Secondary | ICD-10-CM | POA: Diagnosis not present

## 2017-06-27 DIAGNOSIS — R1312 Dysphagia, oropharyngeal phase: Secondary | ICD-10-CM | POA: Diagnosis not present

## 2017-06-29 DIAGNOSIS — R296 Repeated falls: Secondary | ICD-10-CM | POA: Diagnosis not present

## 2017-06-29 DIAGNOSIS — Z9981 Dependence on supplemental oxygen: Secondary | ICD-10-CM | POA: Diagnosis not present

## 2017-06-29 DIAGNOSIS — E1140 Type 2 diabetes mellitus with diabetic neuropathy, unspecified: Secondary | ICD-10-CM | POA: Diagnosis not present

## 2017-06-29 DIAGNOSIS — Z7982 Long term (current) use of aspirin: Secondary | ICD-10-CM | POA: Diagnosis not present

## 2017-06-29 DIAGNOSIS — F329 Major depressive disorder, single episode, unspecified: Secondary | ICD-10-CM | POA: Diagnosis not present

## 2017-06-29 DIAGNOSIS — M6281 Muscle weakness (generalized): Secondary | ICD-10-CM | POA: Diagnosis not present

## 2017-06-29 DIAGNOSIS — I2510 Atherosclerotic heart disease of native coronary artery without angina pectoris: Secondary | ICD-10-CM | POA: Diagnosis not present

## 2017-06-29 DIAGNOSIS — I10 Essential (primary) hypertension: Secondary | ICD-10-CM | POA: Diagnosis not present

## 2017-06-29 DIAGNOSIS — E1122 Type 2 diabetes mellitus with diabetic chronic kidney disease: Secondary | ICD-10-CM | POA: Diagnosis not present

## 2017-06-29 DIAGNOSIS — Z794 Long term (current) use of insulin: Secondary | ICD-10-CM | POA: Diagnosis not present

## 2017-06-29 DIAGNOSIS — J9611 Chronic respiratory failure with hypoxia: Secondary | ICD-10-CM | POA: Diagnosis not present

## 2017-06-29 DIAGNOSIS — Z9181 History of falling: Secondary | ICD-10-CM | POA: Diagnosis not present

## 2017-06-29 DIAGNOSIS — M79662 Pain in left lower leg: Secondary | ICD-10-CM | POA: Diagnosis not present

## 2017-06-29 DIAGNOSIS — E860 Dehydration: Secondary | ICD-10-CM | POA: Diagnosis not present

## 2017-06-29 DIAGNOSIS — M5030 Other cervical disc degeneration, unspecified cervical region: Secondary | ICD-10-CM | POA: Diagnosis not present

## 2017-06-29 DIAGNOSIS — I739 Peripheral vascular disease, unspecified: Secondary | ICD-10-CM | POA: Diagnosis not present

## 2017-06-29 DIAGNOSIS — R42 Dizziness and giddiness: Secondary | ICD-10-CM | POA: Diagnosis not present

## 2017-06-29 DIAGNOSIS — N183 Chronic kidney disease, stage 3 (moderate): Secondary | ICD-10-CM | POA: Diagnosis not present

## 2017-06-30 LAB — CUP PACEART REMOTE DEVICE CHECK
Battery Remaining Percentage: 100 %
Brady Statistic RV Percent Paced: 0 %
HIGH POWER IMPEDANCE MEASURED VALUE: 52 Ohm
Lead Channel Impedance Value: 442 Ohm
Lead Channel Setting Pacing Amplitude: 2.4 V
Lead Channel Setting Pacing Pulse Width: 0.4 ms
Lead Channel Setting Sensing Sensitivity: 0.4 mV
MDC IDC LEAD IMPLANT DT: 20050923
MDC IDC LEAD LOCATION: 753860
MDC IDC LEAD SERIAL: 154635
MDC IDC MSMT BATTERY REMAINING LONGEVITY: 102 mo
MDC IDC MSMT LEADCHNL RV PACING THRESHOLD AMPLITUDE: 0.9 V
MDC IDC MSMT LEADCHNL RV PACING THRESHOLD PULSEWIDTH: 0.4 ms
MDC IDC PG IMPLANT DT: 20121114
MDC IDC SESS DTM: 20180712161500
Pulse Gen Serial Number: 118170

## 2017-07-03 DIAGNOSIS — E860 Dehydration: Secondary | ICD-10-CM | POA: Diagnosis not present

## 2017-07-03 DIAGNOSIS — M79662 Pain in left lower leg: Secondary | ICD-10-CM | POA: Diagnosis not present

## 2017-07-03 DIAGNOSIS — R296 Repeated falls: Secondary | ICD-10-CM | POA: Diagnosis not present

## 2017-07-03 DIAGNOSIS — R42 Dizziness and giddiness: Secondary | ICD-10-CM | POA: Diagnosis not present

## 2017-07-03 DIAGNOSIS — E1122 Type 2 diabetes mellitus with diabetic chronic kidney disease: Secondary | ICD-10-CM | POA: Diagnosis not present

## 2017-07-03 DIAGNOSIS — M6281 Muscle weakness (generalized): Secondary | ICD-10-CM | POA: Diagnosis not present

## 2017-07-03 NOTE — Progress Notes (Deleted)
Cardiology Office Note   Date:  07/03/2017   ID:  Victor [name], DOB [birth year], MRN [id number]  PCP:  Cyndi Bender, PA-C  Cardiologist:  Dr. Peter Martinique   Electrophysiologist:  Dr. Cristopher Peru    No chief complaint on file.    History of Present Illness: Victor [name] is a 81 y.o. male is seen for follow up CAD and CHF. He has a hx of CAD, status post CABG in 1990 with known occlusion of both vein grafts, PCI to the SVG-OM3 in 2014 and attempted CTO PCI of the RCA 09/2014. Chronic combined systolic and diastolic CHF, ischemic cardiomyopathy with history of VT in 2012, status post ICD, diabetes, CKD, HTN, HL, PAD, carotid stenosis.  Admitted 02/2015 with acute CHF exacerbation. He had new EKG changes and underwent LHC with unsuccessful repeat attempt at PCI of CTO RCA. Medical therapy was continued.  Admitted 8/7-06/30/15 with acute on chronic combined systolic and diastolic CHF. Patient has a history of orthostatic hypotension.   Follow-up echo demonstrated no significant change with an EF of 35-40%.   Seen by Kerin Ransom PA in November and December 2017 for titration of medications. 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 At one time they had 20 horses,  now down to 5.  The pt's wife was  killed in a MVA last year. He now lives alone but has family help. Attempted to get him on Entresto  09/23/16 but unable to tolerate due to hypotension. He also has chronic combined systolic + diastolic CHF with EF of 57% by echo Aug 2016. He had VT in 2012 and is S/p BS ICD followed by Dr Lovena Le.  When last seen his aldactone and lisinopril were held due to hypotension. Later started on low dose lisinopril.  He was  admitted to [facility] on 6/13-6/14/18 with CHF exacerbation. CXR showed interstitial edema. Pro BNP > 6000. Echo showed stable EF 35%- akinesis of the inferior and posterior wall, mild to mod MR. Pulmonary HTN with estimated RV systolic pressure of 44 mm Hg. He was  treated with IV lasix with improvement and discharged the following day on prior regimen. Hbg was 10.1.  He was then admitted to the hospital in [place] after falling at home. Hit his head hard on the floor. Had a scalp hematoma and abrasion left face and ear. Injured his arm and foot but no fracture. Reports CT of head, neck and spine were OK. Doesn't recall events prior to fall. Remembers getting up to go to [place]. Next thing he knew son found him on sofa with blood all around. In hospital his insulin was stopped. Other meds continued.  On follow up today he states he complains of pain in neck and spasms. Denies SOB today. States it comes and goes but doesn't last. No chest pain or palpitations. Denies dizziness. He has lost 22 lbs since last visit here. Now followed by primary care in [place]. His last ICD check on July 12 showed 26 episodes of NSVT with good device function.    Studies/Reports Reviewed Today:  Hospital records from [facility] as noted above.   Echo 06/29/15 Mild LVH, EF 35-40%, inferior HK, mild MR, mild LAE, PASP 42 mmHg Impressions: - Compared to the prior study, there has been no significantinterval change.  Last device check in March showed NSVT- longest 9 beats.  Past Medical History:  Diagnosis Date  . Anemia   . Arthritis   .  Automatic implantable cardioverter-defibrillator in situ   . Cardiomyopathy, ischemic    a. s/p ICD placement  . Chronic combined systolic and diastolic CHF (congestive heart failure) ([place])   . Chronic lower back pain   . CKD (chronic kidney disease), stage III   . Coronary artery disease    a. s/p CABG 1990. b. multiple PCIs since that time including CTO angioplasty (without stenting) PCI of RCA 09/2014, with last cath 02/2015 done for worsening CHF with unsuccessful attempt at PCI of the RCA due to inability to cross occlusion - medical therapy advised at that time.  Marland Kitchen GERD (gastroesophageal reflux disease)   . HLD (hyperlipidemia)   .  Hypertension   . Occlusion and stenosis of carotid artery without mention of cerebral infarction   . Orthostatic hypotension   . Paroxysmal ventricular tachycardia ([place])   . Peripheral vascular disease ([place])   . Raynaud's syndrome   . Stroke [facility])    a. CT head 06/2015: Small old lacunar infarct LEFT basal ganglia.  . Thrombocytopenia ([place])   . TIA (transient ischemic attack)    a. s/p LHC on 10/01/14   . Type II diabetes mellitus ([place])     Past Surgical History:  Procedure Laterality Date  . ANAL FISTULECTOMY  2000's  . CARDIAC CATHETERIZATION  07/2014  . CARDIAC CATHETERIZATION  10/01/2014   Procedure: CORONARY BALLOON ANGIOPLASTY;  Surgeon: Peter M Martinique, MD;  Location: [facility] CATH LAB;  Service: Cardiovascular;;  . CARDIAC DEFIBRILLATOR PLACEMENT  02/1989-12/2011   "I've had a total of 6 or 7 defibrillators put in" (09/30/2014)  . CATARACT EXTRACTION, BILATERAL Bilateral ~ 2009  . CORONARY ANGIOPLASTY  10/01/14   PTVA to CTO with restoration of TIMI 3  . CORONARY ANGIOPLASTY WITH STENT PLACEMENT  04/2013   "1"  . CORONARY ARTERY BYPASS GRAFT  01/1989   "CABG X3"   . FINGER FRACTURE SURGERY Left ~ 2000   "pointer"  . FRACTURE SURGERY    . IMPLANTABLE CARDIOVERTER DEFIBRILLATOR (ICD) GENERATOR CHANGE Left 10/05/2011   Procedure: ICD GENERATOR CHANGE;  Surgeon: Evans Lance, MD;  Location: [facility] CATH LAB;  Service: Cardiovascular;  Laterality: Left;  . LEFT AND RIGHT HEART CATHETERIZATION WITH CORONARY/GRAFT ANGIOGRAM N/A 04/26/2013   Procedure: LEFT AND RIGHT HEART CATHETERIZATION WITH Beatrix Fetters;  Surgeon: Sherren Mocha, MD;  Location: [facility] CATH LAB;  Service: Cardiovascular;  Laterality: N/A;  . LEFT HEART CATHETERIZATION WITH CORONARY ANGIOGRAM N/A 08/01/2014   Procedure: LEFT HEART CATHETERIZATION WITH CORONARY ANGIOGRAM;  Surgeon: Sinclair Grooms, MD;  Location: [facility] CATH LAB;  Service: Cardiovascular;  Laterality: N/A;  . LEFT HEART CATHETERIZATION WITH CORONARY  ANGIOGRAM N/A 03/20/2015   Procedure: LEFT HEART CATHETERIZATION WITH CORONARY ANGIOGRAM;  Surgeon: Peter M Martinique, MD;  Location: [facility] CATH LAB;  Service: Cardiovascular;  Laterality: N/A;  . PERCUTANEOUS STENT INTERVENTION  04/26/2013   Procedure: PERCUTANEOUS STENT INTERVENTION;  Surgeon: Sherren Mocha, MD;  Location: [facility] CATH LAB;  Service: Cardiovascular;;  . RIGHT HEART CATHETERIZATION  10/01/2014   Procedure: RIGHT HEART CATH;  Surgeon: Peter M Martinique, MD;  Location: [facility] CATH LAB;  Service: Cardiovascular;;     Current Outpatient Prescriptions  Medication Sig Dispense Refill  . aspirin (ASPIR-81) 81 MG EC tablet Take 81 mg by mouth at bedtime.     . bisoprolol (ZEBETA) 5 MG tablet Take 0.5 tablets (2.5 mg total) by mouth daily. 90 tablet 3  . clopidogrel (PLAVIX) 75 MG tablet Take 1 tablet (75 mg total) by  mouth daily. 90 tablet 3  . dexamethasone (DECADRON) 4 MG tablet Take 4 mg by mouth daily as needed (back pain).   0  . DULoxetine (CYMBALTA) 30 MG capsule Take 30 mg by mouth daily. Take one capsule by mouth daily for 1 week then increase to 2 capsules daily.     . furosemide (LASIX) 40 MG tablet Take 1 tablet (40 mg total) by mouth daily. 90 tablet 3  . HYDROcodone-acetaminophen (NORCO) 10-325 MG per tablet Take 1 tablet by mouth every 8 (eight) hours as needed for moderate pain.   0  . LEVEMIR FLEXTOUCH 100 UNIT/ML Pen INJECT 30 UNITS UNDER SKIN EACH MORNING  1  . losartan (COZAAR) 50 MG tablet Take 0.5 tablets (25 mg total) by mouth daily. 90 tablet 3  . LYRICA 150 MG capsule Take 150 mg by mouth daily.   1  . metFORMIN (GLUCOPHAGE) 1000 MG tablet Take 1,000 mg by mouth 2 (two) times daily with a meal.    . mupirocin ointment (BACTROBAN) 2 % Apply 1 application topically 2 (two) times daily.  3  . nitroGLYCERIN (NITROSTAT) 0.4 MG SL tablet Place 0.4 mg under the tongue every 5 (five) minutes as needed for chest pain.    Marland Kitchen omeprazole (PRILOSEC) 20 MG capsule TAKE 1 CAPSULE BY MOUTH ONCE  DAILY 60 capsule 10  . ONE TOUCH ULTRA TEST test strip CHECK SUGAR AS DIRECTED 3 TIMES DAILY  5  . pravastatin (PRAVACHOL) 40 MG tablet Take 1 tablet (40 mg total) by mouth at bedtime. 90 tablet 3  . SANTYL ointment Apply 1 application topically every other day.  2  . traZODone (DESYREL) 50 MG tablet Take 1 tablet by mouth at bedtime.  1  . vitamin B-12 (CYANOCOBALAMIN) 1000 MCG tablet Take 1,000 mcg by mouth daily.     No current facility-administered medications for this visit.     Allergies:   Coreg [carvedilol]; Lovastatin; and Ativan [lorazepam]    Social History:  The patient  reports that he has quit smoking. His smoking use included Pipe. He quit after 3.00 years of use. He has never used smokeless tobacco. He reports that he drinks alcohol. He reports that he does not use drugs.   Family History:  The patient's family history includes Heart attack in his father; Heart failure (age of onset: 56) in his mother; Leukemia in his sister; Stomach cancer in his maternal grandfather.    ROS:   Please see the history of present illness.   Review of Systems  Constitution: Positive for decreased appetite, malaise/fatigue and weight loss.  Cardiovascular: Positive for dyspnea on exertion. Negative for leg swelling.  Respiratory: Positive for shortness of breath. Negative for wheezing.   Musculoskeletal: Positive for neck pain. Negative for muscle weakness.  Gastrointestinal: Negative for bloating.  Neurological: Negative for loss of balance.  All other systems reviewed and are negative.    PHYSICAL EXAM: VS:  There were no vitals taken for this visit.    Wt Readings from Last 3 Encounters:  05/23/17 157 lb (71.2 kg)  01/27/17 179 lb 9.6 oz (81.5 kg)  11/25/16 174 lb (78.9 kg)     GEN: chronically ill appearing, in no acute distress  HEENT: hematoma posterior left scalp. Abrasion left face and ear. Neck: no JVD,   no masses Cardiac:  Normal S1/S2, RRR; no murmur ,  no rubs or  gallops, No edema Respiratory:  Decreased breath sounds bilaterally, no wheezing, rhonchi or rales. GI: soft, nontender,  nondistended, + BS MS: no deformity or atrophy  Skin: warm and dry  Neuro:  CNs II-XII intact, Strength and sensation are intact Psych: Normal affect   EKG:  EKG is not ordered today.   Recent Labs: 09/16/2016: Hemoglobin 9.9; Platelets 222 01/27/2017: BUN 26; Creat 1.06; Potassium 5.2; Sodium 140  05/18/17: A1c 10.5. CMET normal.   Lipid Panel    Component Value Date/Time   CHOL 127 06/29/2015 0508   TRIG 111 06/29/2015 0508   HDL 36 (L) 06/29/2015 0508   CHOLHDL 3.5 06/29/2015 0508   VLDL 22 06/29/2015 0508   LDLCALC 69 06/29/2015 0508    Labs dated 05/12/16: cholesterol 127, triglycerides 166, HDL 48, LDL 46.  Dated 08/10/16: A1c 7.7%.   ASSESSMENT AND PLAN:  1. Chronic combined systolic and diastolic CHF:  Overall, appears euvolemic today. Will continue lasix 40 mg daily.  Titration of other CHF medications limited by  orthostatic hypotension. I am concerned that recent fall may have been related to orthostasis. Will reduce losartan to 25 mg daily. He is using oxygen continually at home.   2. Cardiomyopathy, ischemic:  Last EF 35-40% by echocardiogram. Continue beta blocker, ARB. Intolerant of Entresto due to hypotension.  3. Coronary artery disease involving native coronary artery of native heart without angina pectoris:   He had a  heart catheterization demonstrating severe 2 vessel CAD with chronic occlusion of the obtuse marginal and RCA with unsuccessful attempt at CTO  PCI of the RCA. Continue aspirin, beta blocker, Plavix, statin. He is not a candidate for further revascularization.  4. Essential hypertension:  Controlled.  5. Orthostatic hypotension:  Continue  compression stockings to help with his orthostatic hypotension. Adjustment in meds as noted above.   6. Hyperlipidemia:    continue statin.  7. ICD- BS Nov 2012 for VT: will reintterogate  ICD to make sure no significant arrhythmia related to recent fall.  8. Type 2 diabetes mellitus with other circulatory complications:  Follow-up with primary care.  9. CKD (chronic kidney disease), class 3.    10. Back pain/cervical pain. He is a poor candidate for surgery with multiple co-morbidities and general poor health. Recommend conservative therapy.  Will request recent hospital records from [place]. Mr. Sally appears to be in declining health with more functional limitation. Will arrange follow up in 6 weeks.      Medication Changes: Current medicines are reviewed at length with the patient today.  Concerns regarding medicines are as outlined above.  The following changes have been made:  See above  Labs/ tests ordered today include:   No orders of the defined types were placed in this encounter.    Signed, Peter Martinique MD, [facility]    07/03/2017 10:29 AM

## 2017-07-04 ENCOUNTER — Ambulatory Visit: Payer: Medicare Other | Admitting: Cardiology

## 2017-07-04 DIAGNOSIS — E1122 Type 2 diabetes mellitus with diabetic chronic kidney disease: Secondary | ICD-10-CM | POA: Diagnosis not present

## 2017-07-04 DIAGNOSIS — M79662 Pain in left lower leg: Secondary | ICD-10-CM | POA: Diagnosis not present

## 2017-07-04 DIAGNOSIS — E860 Dehydration: Secondary | ICD-10-CM | POA: Diagnosis not present

## 2017-07-04 DIAGNOSIS — R42 Dizziness and giddiness: Secondary | ICD-10-CM | POA: Diagnosis not present

## 2017-07-04 DIAGNOSIS — M6281 Muscle weakness (generalized): Secondary | ICD-10-CM | POA: Diagnosis not present

## 2017-07-04 DIAGNOSIS — R296 Repeated falls: Secondary | ICD-10-CM | POA: Diagnosis not present

## 2017-07-07 DIAGNOSIS — R42 Dizziness and giddiness: Secondary | ICD-10-CM | POA: Diagnosis not present

## 2017-07-07 DIAGNOSIS — M6281 Muscle weakness (generalized): Secondary | ICD-10-CM | POA: Diagnosis not present

## 2017-07-07 DIAGNOSIS — E1122 Type 2 diabetes mellitus with diabetic chronic kidney disease: Secondary | ICD-10-CM | POA: Diagnosis not present

## 2017-07-07 DIAGNOSIS — E860 Dehydration: Secondary | ICD-10-CM | POA: Diagnosis not present

## 2017-07-07 DIAGNOSIS — M79662 Pain in left lower leg: Secondary | ICD-10-CM | POA: Diagnosis not present

## 2017-07-07 DIAGNOSIS — R296 Repeated falls: Secondary | ICD-10-CM | POA: Diagnosis not present

## 2017-07-10 DIAGNOSIS — I5021 Acute systolic (congestive) heart failure: Secondary | ICD-10-CM | POA: Diagnosis not present

## 2017-07-10 DIAGNOSIS — Z6825 Body mass index (BMI) 25.0-25.9, adult: Secondary | ICD-10-CM | POA: Diagnosis not present

## 2017-07-10 DIAGNOSIS — E1165 Type 2 diabetes mellitus with hyperglycemia: Secondary | ICD-10-CM | POA: Diagnosis not present

## 2017-07-11 DIAGNOSIS — M79662 Pain in left lower leg: Secondary | ICD-10-CM | POA: Diagnosis not present

## 2017-07-11 DIAGNOSIS — R296 Repeated falls: Secondary | ICD-10-CM | POA: Diagnosis not present

## 2017-07-11 DIAGNOSIS — R42 Dizziness and giddiness: Secondary | ICD-10-CM | POA: Diagnosis not present

## 2017-07-11 DIAGNOSIS — M6281 Muscle weakness (generalized): Secondary | ICD-10-CM | POA: Diagnosis not present

## 2017-07-11 DIAGNOSIS — E860 Dehydration: Secondary | ICD-10-CM | POA: Diagnosis not present

## 2017-07-11 DIAGNOSIS — E1122 Type 2 diabetes mellitus with diabetic chronic kidney disease: Secondary | ICD-10-CM | POA: Diagnosis not present

## 2017-07-13 DIAGNOSIS — I5021 Acute systolic (congestive) heart failure: Secondary | ICD-10-CM | POA: Diagnosis not present

## 2017-07-16 ENCOUNTER — Emergency Department (HOSPITAL_COMMUNITY): Payer: Medicare Other

## 2017-07-16 ENCOUNTER — Inpatient Hospital Stay (HOSPITAL_COMMUNITY)
Admission: EM | Admit: 2017-07-16 | Discharge: 2017-07-20 | DRG: 682 | Disposition: A | Discharge status: Home / Self Care | Payer: Medicare Other | Attending: Family Medicine | Admitting: Family Medicine

## 2017-07-16 ENCOUNTER — Encounter (HOSPITAL_COMMUNITY): Payer: Self-pay | Admitting: Emergency Medicine

## 2017-07-16 DIAGNOSIS — J439 Emphysema, unspecified: Secondary | ICD-10-CM | POA: Diagnosis present

## 2017-07-16 DIAGNOSIS — I2721 Secondary pulmonary arterial hypertension: Secondary | ICD-10-CM | POA: Diagnosis not present

## 2017-07-16 DIAGNOSIS — I25810 Atherosclerosis of coronary artery bypass graft(s) without angina pectoris: Secondary | ICD-10-CM | POA: Diagnosis not present

## 2017-07-16 DIAGNOSIS — Z9981 Dependence on supplemental oxygen: Secondary | ICD-10-CM

## 2017-07-16 DIAGNOSIS — Z7984 Long term (current) use of oral hypoglycemic drugs: Secondary | ICD-10-CM

## 2017-07-16 DIAGNOSIS — Z794 Long term (current) use of insulin: Secondary | ICD-10-CM

## 2017-07-16 DIAGNOSIS — Z9841 Cataract extraction status, right eye: Secondary | ICD-10-CM | POA: Diagnosis not present

## 2017-07-16 DIAGNOSIS — I5022 Chronic systolic (congestive) heart failure: Secondary | ICD-10-CM | POA: Diagnosis present

## 2017-07-16 DIAGNOSIS — E785 Hyperlipidemia, unspecified: Secondary | ICD-10-CM | POA: Diagnosis present

## 2017-07-16 DIAGNOSIS — Z888 Allergy status to other drugs, medicaments and biological substances status: Secondary | ICD-10-CM

## 2017-07-16 DIAGNOSIS — Z8249 Family history of ischemic heart disease and other diseases of the circulatory system: Secondary | ICD-10-CM

## 2017-07-16 DIAGNOSIS — I361 Nonrheumatic tricuspid (valve) insufficiency: Secondary | ICD-10-CM | POA: Diagnosis not present

## 2017-07-16 DIAGNOSIS — I5023 Acute on chronic systolic (congestive) heart failure: Secondary | ICD-10-CM | POA: Diagnosis not present

## 2017-07-16 DIAGNOSIS — N179 Acute kidney failure, unspecified: Secondary | ICD-10-CM | POA: Diagnosis not present

## 2017-07-16 DIAGNOSIS — G4733 Obstructive sleep apnea (adult) (pediatric): Secondary | ICD-10-CM | POA: Diagnosis not present

## 2017-07-16 DIAGNOSIS — I110 Hypertensive heart disease with heart failure: Secondary | ICD-10-CM | POA: Diagnosis not present

## 2017-07-16 DIAGNOSIS — I4891 Unspecified atrial fibrillation: Secondary | ICD-10-CM | POA: Diagnosis present

## 2017-07-16 DIAGNOSIS — E1122 Type 2 diabetes mellitus with diabetic chronic kidney disease: Secondary | ICD-10-CM | POA: Diagnosis present

## 2017-07-16 DIAGNOSIS — E876 Hypokalemia: Secondary | ICD-10-CM | POA: Diagnosis not present

## 2017-07-16 DIAGNOSIS — I255 Ischemic cardiomyopathy: Secondary | ICD-10-CM | POA: Diagnosis present

## 2017-07-16 DIAGNOSIS — I10 Essential (primary) hypertension: Secondary | ICD-10-CM | POA: Diagnosis present

## 2017-07-16 DIAGNOSIS — G8929 Other chronic pain: Secondary | ICD-10-CM | POA: Diagnosis present

## 2017-07-16 DIAGNOSIS — E1129 Type 2 diabetes mellitus with other diabetic kidney complication: Secondary | ICD-10-CM

## 2017-07-16 DIAGNOSIS — D500 Iron deficiency anemia secondary to blood loss (chronic): Secondary | ICD-10-CM | POA: Diagnosis not present

## 2017-07-16 DIAGNOSIS — Z8673 Personal history of transient ischemic attack (TIA), and cerebral infarction without residual deficits: Secondary | ICD-10-CM

## 2017-07-16 DIAGNOSIS — N189 Chronic kidney disease, unspecified: Secondary | ICD-10-CM | POA: Diagnosis not present

## 2017-07-16 DIAGNOSIS — R296 Repeated falls: Secondary | ICD-10-CM | POA: Diagnosis present

## 2017-07-16 DIAGNOSIS — R079 Chest pain, unspecified: Secondary | ICD-10-CM

## 2017-07-16 DIAGNOSIS — Z951 Presence of aortocoronary bypass graft: Secondary | ICD-10-CM | POA: Diagnosis not present

## 2017-07-16 DIAGNOSIS — Z79899 Other long term (current) drug therapy: Secondary | ICD-10-CM

## 2017-07-16 DIAGNOSIS — E871 Hypo-osmolality and hyponatremia: Secondary | ICD-10-CM | POA: Diagnosis not present

## 2017-07-16 DIAGNOSIS — Z9842 Cataract extraction status, left eye: Secondary | ICD-10-CM | POA: Diagnosis not present

## 2017-07-16 DIAGNOSIS — M1990 Unspecified osteoarthritis, unspecified site: Secondary | ICD-10-CM | POA: Diagnosis present

## 2017-07-16 DIAGNOSIS — Z955 Presence of coronary angioplasty implant and graft: Secondary | ICD-10-CM

## 2017-07-16 DIAGNOSIS — Z87891 Personal history of nicotine dependence: Secondary | ICD-10-CM | POA: Diagnosis not present

## 2017-07-16 DIAGNOSIS — N183 Chronic kidney disease, stage 3 (moderate): Secondary | ICD-10-CM | POA: Diagnosis present

## 2017-07-16 DIAGNOSIS — I482 Chronic atrial fibrillation: Secondary | ICD-10-CM | POA: Diagnosis present

## 2017-07-16 DIAGNOSIS — I5043 Acute on chronic combined systolic (congestive) and diastolic (congestive) heart failure: Secondary | ICD-10-CM | POA: Diagnosis not present

## 2017-07-16 DIAGNOSIS — K219 Gastro-esophageal reflux disease without esophagitis: Secondary | ICD-10-CM | POA: Diagnosis present

## 2017-07-16 DIAGNOSIS — I7300 Raynaud's syndrome without gangrene: Secondary | ICD-10-CM | POA: Diagnosis present

## 2017-07-16 DIAGNOSIS — Z800 Family history of malignant neoplasm of digestive organs: Secondary | ICD-10-CM

## 2017-07-16 DIAGNOSIS — M545 Low back pain: Secondary | ICD-10-CM | POA: Diagnosis present

## 2017-07-16 DIAGNOSIS — E119 Type 2 diabetes mellitus without complications: Secondary | ICD-10-CM | POA: Diagnosis not present

## 2017-07-16 DIAGNOSIS — I130 Hypertensive heart and chronic kidney disease with heart failure and stage 1 through stage 4 chronic kidney disease, or unspecified chronic kidney disease: Secondary | ICD-10-CM | POA: Diagnosis present

## 2017-07-16 DIAGNOSIS — J9811 Atelectasis: Secondary | ICD-10-CM | POA: Diagnosis not present

## 2017-07-16 DIAGNOSIS — I441 Atrioventricular block, second degree: Secondary | ICD-10-CM | POA: Diagnosis present

## 2017-07-16 DIAGNOSIS — I25119 Atherosclerotic heart disease of native coronary artery with unspecified angina pectoris: Secondary | ICD-10-CM | POA: Diagnosis present

## 2017-07-16 DIAGNOSIS — Z806 Family history of leukemia: Secondary | ICD-10-CM

## 2017-07-16 DIAGNOSIS — Z95810 Presence of automatic (implantable) cardiac defibrillator: Secondary | ICD-10-CM | POA: Diagnosis not present

## 2017-07-16 DIAGNOSIS — I340 Nonrheumatic mitral (valve) insufficiency: Secondary | ICD-10-CM | POA: Diagnosis not present

## 2017-07-16 DIAGNOSIS — D509 Iron deficiency anemia, unspecified: Secondary | ICD-10-CM | POA: Diagnosis present

## 2017-07-16 DIAGNOSIS — I272 Pulmonary hypertension, unspecified: Secondary | ICD-10-CM | POA: Diagnosis present

## 2017-07-16 DIAGNOSIS — Z7982 Long term (current) use of aspirin: Secondary | ICD-10-CM | POA: Diagnosis not present

## 2017-07-16 DIAGNOSIS — I081 Rheumatic disorders of both mitral and tricuspid valves: Secondary | ICD-10-CM | POA: Diagnosis present

## 2017-07-16 DIAGNOSIS — J438 Other emphysema: Secondary | ICD-10-CM | POA: Diagnosis not present

## 2017-07-16 LAB — BASIC METABOLIC PANEL
Anion gap: 11 (ref 5–15)
BUN: 45 mg/dL — ABNORMAL HIGH (ref 6–20)
CALCIUM: 8.3 mg/dL — AB (ref 8.9–10.3)
CO2: 28 mmol/L (ref 22–32)
CREATININE: 2.37 mg/dL — AB (ref 0.61–1.24)
Chloride: 87 mmol/L — ABNORMAL LOW (ref 101–111)
GFR calc Af Amer: 28 mL/min — ABNORMAL LOW (ref >60)
GFR, EST NON AFRICAN AMERICAN: 24 mL/min — AB (ref >60)
GLUCOSE: 102 mg/dL — AB (ref 65–99)
Potassium: 4.9 mmol/L (ref 3.5–5.1)
Sodium: 126 mmol/L — ABNORMAL LOW (ref 135–145)

## 2017-07-16 LAB — CBC
HCT: 25.6 % — ABNORMAL LOW (ref 39.0–52.0)
Hemoglobin: 7.9 g/dL — ABNORMAL LOW (ref 13.0–17.0)
MCH: 23.8 pg — AB (ref 26.0–34.0)
MCHC: 30.9 g/dL (ref 30.0–36.0)
MCV: 77.1 fL — ABNORMAL LOW (ref 78.0–100.0)
PLATELETS: 135 10*3/uL — AB (ref 150–400)
RBC: 3.32 MIL/uL — ABNORMAL LOW (ref 4.22–5.81)
RDW: 15.7 % — AB (ref 11.5–15.5)
WBC: 8.4 10*3/uL (ref 4.0–10.5)

## 2017-07-16 LAB — I-STAT TROPONIN, ED: TROPONIN I, POC: 0.01 ng/mL (ref 0.00–0.08)

## 2017-07-16 LAB — CBG MONITORING, ED: Glucose-Capillary: 92 mg/dL (ref 65–99)

## 2017-07-16 LAB — POC OCCULT BLOOD, ED: Fecal Occult Bld: NEGATIVE

## 2017-07-16 LAB — BRAIN NATRIURETIC PEPTIDE: B Natriuretic Peptide: 1436 pg/mL — ABNORMAL HIGH (ref 0.0–100.0)

## 2017-07-16 MED ORDER — BISACODYL 5 MG PO TBEC
5.0000 mg | DELAYED_RELEASE_TABLET | Freq: Every day | ORAL | Status: DC | PRN
Start: 2017-07-16 — End: 2017-07-20

## 2017-07-16 MED ORDER — INSULIN ASPART 100 UNIT/ML ~~LOC~~ SOLN
0.0000 [IU] | Freq: Three times a day (TID) | SUBCUTANEOUS | Status: DC
Start: 2017-07-17 — End: 2017-07-20
  Administered 2017-07-17 – 2017-07-18 (×3): 2 [IU] via SUBCUTANEOUS
  Administered 2017-07-18: 5 [IU] via SUBCUTANEOUS
  Administered 2017-07-19: 2 [IU] via SUBCUTANEOUS
  Administered 2017-07-19: 1 [IU] via SUBCUTANEOUS
  Administered 2017-07-19: 3 [IU] via SUBCUTANEOUS
  Administered 2017-07-20: 2 [IU] via SUBCUTANEOUS

## 2017-07-16 MED ORDER — SENNOSIDES-DOCUSATE SODIUM 8.6-50 MG PO TABS: 1.0000 | ORAL_TABLET | Freq: Every evening | ORAL | Status: DC | PRN

## 2017-07-16 MED ORDER — ASPIRIN EC 81 MG PO TBEC
81.0000 mg | DELAYED_RELEASE_TABLET | Freq: Every day | ORAL | Status: DC
  Administered 2017-07-18 – 2017-07-20 (×3): 81 mg via ORAL
  Filled 2017-07-16 (×4): qty 1

## 2017-07-16 MED ORDER — INSULIN DETEMIR 100 UNIT/ML ~~LOC~~ SOLN
20.0000 [IU] | Freq: Every day | SUBCUTANEOUS | Status: DC
  Administered 2017-07-17 – 2017-07-20 (×4): 20 [IU] via SUBCUTANEOUS
  Filled 2017-07-16 (×4): qty 0.2

## 2017-07-16 MED ORDER — HYDROCODONE-ACETAMINOPHEN 10-325 MG PO TABS: 1.0000 | ORAL_TABLET | Freq: Three times a day (TID) | ORAL | Status: DC | PRN

## 2017-07-16 MED ORDER — SODIUM CHLORIDE 0.9% FLUSH
3.0000 mL | Freq: Two times a day (BID) | INTRAVENOUS | Status: DC
  Administered 2017-07-17 – 2017-07-20 (×6): 3 mL via INTRAVENOUS

## 2017-07-16 MED ORDER — VITAMIN B-12 1000 MCG PO TABS
1000.0000 ug | ORAL_TABLET | Freq: Every day | ORAL | Status: DC
  Administered 2017-07-17 – 2017-07-20 (×4): 1000 ug via ORAL
  Filled 2017-07-16 (×4): qty 1

## 2017-07-16 MED ORDER — ONDANSETRON HCL 4 MG PO TABS: 4.0000 mg | ORAL_TABLET | Freq: Four times a day (QID) | ORAL | Status: DC | PRN

## 2017-07-16 MED ORDER — BISOPROLOL FUMARATE 5 MG PO TABS
2.5000 mg | ORAL_TABLET | Freq: Every day | ORAL | Status: DC
  Administered 2017-07-17 – 2017-07-20 (×4): 2.5 mg via ORAL
  Filled 2017-07-16 (×4): qty 1

## 2017-07-16 MED ORDER — PREGABALIN 75 MG PO CAPS
150.0000 mg | ORAL_CAPSULE | Freq: Every day | ORAL | Status: DC
  Administered 2017-07-17 – 2017-07-20 (×4): 150 mg via ORAL
  Filled 2017-07-16 (×4): qty 2

## 2017-07-16 MED ORDER — DULOXETINE HCL 60 MG PO CPEP
60.0000 mg | ORAL_CAPSULE | Freq: Every day | ORAL | Status: DC
  Administered 2017-07-17 – 2017-07-20 (×4): 60 mg via ORAL
  Filled 2017-07-16 (×4): qty 1

## 2017-07-16 MED ORDER — TRAZODONE HCL 50 MG PO TABS
50.0000 mg | ORAL_TABLET | Freq: Every day | ORAL | Status: DC
  Administered 2017-07-17 – 2017-07-19 (×3): 50 mg via ORAL
  Filled 2017-07-16 (×3): qty 1

## 2017-07-16 MED ORDER — INSULIN ASPART 100 UNIT/ML ~~LOC~~ SOLN
0.0000 [IU] | Freq: Every day | SUBCUTANEOUS | Status: DC
  Administered 2017-07-18 – 2017-07-19 (×2): 3 [IU] via SUBCUTANEOUS

## 2017-07-16 MED ORDER — ACETAMINOPHEN 650 MG RE SUPP: 650.0000 mg | Freq: Four times a day (QID) | RECTAL | Status: DC | PRN

## 2017-07-16 MED ORDER — PRAVASTATIN SODIUM 40 MG PO TABS
40.0000 mg | ORAL_TABLET | Freq: Every day | ORAL | Status: DC
  Administered 2017-07-17 – 2017-07-19 (×3): 40 mg via ORAL
  Filled 2017-07-16 (×3): qty 1

## 2017-07-16 MED ORDER — ONDANSETRON HCL 4 MG/2ML IJ SOLN: 4.0000 mg | Freq: Four times a day (QID) | INTRAMUSCULAR | Status: DC | PRN

## 2017-07-16 MED ORDER — NITROGLYCERIN 0.4 MG SL SUBL: 0.4000 mg | SUBLINGUAL_TABLET | SUBLINGUAL | Status: DC | PRN

## 2017-07-16 MED ORDER — ACETAMINOPHEN 325 MG PO TABS
650.0000 mg | ORAL_TABLET | Freq: Four times a day (QID) | ORAL | Status: DC | PRN
  Administered 2017-07-17 – 2017-07-20 (×5): 650 mg via ORAL
  Filled 2017-07-16 (×5): qty 2

## 2017-07-16 MED ORDER — CLOPIDOGREL BISULFATE 75 MG PO TABS
75.0000 mg | ORAL_TABLET | Freq: Every day | ORAL | Status: DC
  Administered 2017-07-17 – 2017-07-18 (×2): 75 mg via ORAL
  Filled 2017-07-16 (×2): qty 1

## 2017-07-16 NOTE — ED Notes (Signed)
Patient transported to X-ray 

## 2017-07-16 NOTE — ED Notes (Signed)
Pacemaker interrogated. 

## 2017-07-16 NOTE — ED Provider Notes (Signed)
[facility] DEPT Provider Note   CSN: [id number] Arrival date & time: 07/16/17  1951     History   Chief Complaint Chief Complaint  Patient presents with  . Fall  . Chest Pain    HPI Victor [name] is a 81 y.o. male who presents for evaluation of multiple falls.  He reports that he feels like he has jelly in his legs and falls down.  He reports he has fallen multiple times while using his walker He reports that he has not struck his head while falling within the past few months.  He denies any bowel or bladder changes or incontinence. He has chronic neck and chronic back pain which is unchanged from its normal.  He reports that this morning he had chest pain that got better after he took a dose of his home nitroglycerin.  He denies any SOB, cough, abdominal pain, N/V/D.    HPI  Past Medical History:  Diagnosis Date  . Anemia   . Arthritis   . Automatic implantable cardioverter-defibrillator in situ   . Cardiomyopathy, ischemic    a. s/p ICD placement  . Chronic combined systolic and diastolic CHF (congestive heart failure) ([place])   . Chronic lower back pain   . CKD (chronic kidney disease), stage III   . Coronary artery disease    a. s/p CABG 1990. b. multiple PCIs since that time including CTO angioplasty (without stenting) PCI of RCA 09/2014, with last cath 02/2015 done for worsening CHF with unsuccessful attempt at PCI of the RCA due to inability to cross occlusion - medical therapy advised at that time.  Marland Kitchen GERD (gastroesophageal reflux disease)   . HLD (hyperlipidemia)   . Hypertension   . Occlusion and stenosis of carotid artery without mention of cerebral infarction   . Orthostatic hypotension   . Paroxysmal ventricular tachycardia ([place])   . Peripheral vascular disease ([place])   . Raynaud's syndrome   . Stroke [facility])    a. CT head 06/2015: Small old lacunar infarct LEFT basal ganglia.  . Thrombocytopenia ([place])   . TIA (transient ischemic attack)    a. s/p LHC on  10/01/14   . Type II diabetes mellitus [facility])     Patient Active Problem List   Diagnosis Date Noted  . AKI (acute kidney injury) ([place]) 07/16/2017  . Hyponatremia 07/16/2017  . Chronic renal insufficiency, stage 3 (moderate) 10/27/2016  . Orthostatic hypotension 09/16/2016  . Emphysema of lung ([place]) 12/23/2015  . OSA (obstructive sleep apnea)   . Near syncope 06/28/2015  . ICD-  04/13/2015  . GERD (gastroesophageal reflux disease)   . Hx of CABG 1990   . Essential hypertension 10/02/2014  . TIA (transient ischemic attack) 10/02/2014  . Microcytic anemia 04/25/2013  . Chronic clinical systolic heart failure ([place]) 04/24/2013  . Insulin dependent type 2 diabetes mellitus, controlled ([place]) 07/25/2009  . Cardiomyopathy, ischemic 07/25/2009  . VENTRICULAR TACHYCARDIA 07/25/2009  . Peripheral vascular disease ([place]) 07/25/2009  . Osteoarthritis 07/25/2009  . Arthropathy 07/25/2009  . BACK PAIN 07/25/2009  . Automatic implantable cardioverter-defibrillator in situ 07/25/2009    Past Surgical History:  Procedure Laterality Date  . ANAL FISTULECTOMY  2000's  . CARDIAC CATHETERIZATION  07/2014  . CARDIAC CATHETERIZATION  10/01/2014   Procedure: CORONARY BALLOON ANGIOPLASTY;  Surgeon: Peter M Martinique, MD;  Location[facility] CATH LAB;  Service: Cardiovascular;;  . CARDIAC DEFIBRILLATOR PLACEMENT  02/1989-12/2011   "I've had a total of 6 or 7 defibrillators put in" (09/30/2014)  .  CATARACT EXTRACTION, BILATERAL Bilateral ~ 2009  . CORONARY ANGIOPLASTY  10/01/14   PTVA to CTO with restoration of TIMI 3  . CORONARY ANGIOPLASTY WITH STENT PLACEMENT  04/2013   "1"  . CORONARY ARTERY BYPASS GRAFT  01/1989   "CABG X3"   . FINGER FRACTURE SURGERY Left ~ 2000   "pointer"  . FRACTURE SURGERY    . IMPLANTABLE CARDIOVERTER DEFIBRILLATOR (ICD) GENERATOR CHANGE Left 10/05/2011   Procedure: ICD GENERATOR CHANGE;  Surgeon: Evans Lance, MD;  Location: [facility] CATH LAB;  Service: Cardiovascular;  Laterality:  Left;  . LEFT AND RIGHT HEART CATHETERIZATION WITH CORONARY/GRAFT ANGIOGRAM N/A 04/26/2013   Procedure: LEFT AND RIGHT HEART CATHETERIZATION WITH Beatrix Fetters;  Surgeon: Sherren Mocha, MD;  Location: [facility] CATH LAB;  Service: Cardiovascular;  Laterality: N/A;  . LEFT HEART CATHETERIZATION WITH CORONARY ANGIOGRAM N/A 08/01/2014   Procedure: LEFT HEART CATHETERIZATION WITH CORONARY ANGIOGRAM;  Surgeon: Sinclair Grooms, MD;  Location: [facility] CATH LAB;  Service: Cardiovascular;  Laterality: N/A;  . LEFT HEART CATHETERIZATION WITH CORONARY ANGIOGRAM N/A 03/20/2015   Procedure: LEFT HEART CATHETERIZATION WITH CORONARY ANGIOGRAM;  Surgeon: Peter M Martinique, MD;  Location: [facility] CATH LAB;  Service: Cardiovascular;  Laterality: N/A;  . PERCUTANEOUS STENT INTERVENTION  04/26/2013   Procedure: PERCUTANEOUS STENT INTERVENTION;  Surgeon: Sherren Mocha, MD;  Location: [facility] CATH LAB;  Service: Cardiovascular;;  . RIGHT HEART CATHETERIZATION  10/01/2014   Procedure: RIGHT HEART CATH;  Surgeon: Peter M Martinique, MD;  Location: [facility] CATH LAB;  Service: Cardiovascular;;       Home Medications    Prior to Admission medications   Medication Sig Start Date End Date Taking? Authorizing Provider  aspirin (ASPIR-81) 81 MG EC tablet Take 81 mg by mouth at bedtime.    Yes [provider]  bisoprolol (ZEBETA) 5 MG tablet Take 0.5 tablets (2.5 mg total) by mouth daily. 01/27/17  Yes Martinique, Peter M, MD  clopidogrel (PLAVIX) 75 MG tablet Take 1 tablet (75 mg total) by mouth daily. 01/27/17  Yes Martinique, Peter M, MD  DULoxetine (CYMBALTA) 30 MG capsule Take 60 mg by mouth daily. .   Yes [provider]  furosemide (LASIX) 40 MG tablet Take 1 tablet (40 mg total) by mouth daily. 01/27/17  Yes Martinique, Peter M, MD  LEVEMIR FLEXTOUCH 100 UNIT/ML Pen INJECT 30 UNITS UNDER SKIN EACH MORNING 04/15/16  Yes [provider]  losartan (COZAAR) 50 MG tablet Take 0.5 tablets (25 mg total) by mouth daily. 05/23/17 05/13/19 Yes  Martinique, Peter M, MD  LYRICA 150 MG capsule Take 150 mg by mouth daily.  02/03/15  Yes [provider]  metFORMIN (GLUCOPHAGE) 1000 MG tablet Take 1,000 mg by mouth 2 (two) times daily with a meal.   Yes [provider]  mupirocin ointment (BACTROBAN) 2 % Apply 1 application topically 2 (two) times daily. 06/24/15  Yes [provider]  nitroGLYCERIN (NITROSTAT) 0.4 MG SL tablet Place 0.4 mg under the tongue every 5 (five) minutes as needed for chest pain.   Yes [provider]  omeprazole (PRILOSEC) 20 MG capsule TAKE 1 CAPSULE BY MOUTH ONCE DAILY 03/28/17  Yes Martinique, Peter M, MD  pravastatin (PRAVACHOL) 40 MG tablet Take 1 tablet (40 mg total) by mouth at bedtime. 05/16/16  Yes Martinique, Peter M, MD  SANTYL ointment Apply 1 application topically every other day. 03/04/15  Yes [provider]  traZODone (DESYREL) 50 MG tablet Take 1 tablet by mouth at bedtime. 08/10/16  Yes [provider]  vitamin B-12 (CYANOCOBALAMIN) 1000 MCG tablet Take 1,000 mcg by mouth daily.   Yes [provider]  ONE TOUCH ULTRA TEST test strip CHECK SUGAR AS DIRECTED 3 TIMES DAILY 05/24/15   [provider]    Family History Family History  Problem Relation Age of Onset  . Heart failure Mother 57  . Heart attack Father   . Leukemia Sister   . Stomach cancer Maternal Grandfather     Social History Social History  Substance Use Topics  . Smoking status: Former Smoker    Years: 3.00    Types: Pipe  . Smokeless tobacco: Never Used     Comment: "quit smoking pipe in early 1970's"  . Alcohol use Yes     Comment: 09/30/2014 "wine w/dinner maybe once/yr"     Allergies   Coreg [carvedilol]; Lovastatin; and Ativan [lorazepam]   Review of Systems Review of Systems  Constitutional: Negative for chills and fever.  HENT: Negative for ear pain and sore throat.   Eyes: Negative for pain and visual disturbance.  Respiratory: Negative for cough, chest  tightness, shortness of breath and wheezing.   Cardiovascular: Positive for chest pain and leg swelling. Negative for palpitations.  Gastrointestinal: Negative for abdominal distention, abdominal pain, blood in stool, constipation, diarrhea, nausea and vomiting.  Genitourinary: Negative for dysuria and hematuria.  Musculoskeletal: Positive for back pain (Chronic) and neck pain (Chronic). Negative for arthralgias.  Skin: Negative for color change and rash.  Neurological: Positive for weakness. Negative for dizziness, seizures, syncope, facial asymmetry, numbness and headaches.  All other systems reviewed and are negative.    Physical Exam Updated Vital Signs BP (!) 103/52   Pulse 60   Temp 97.9 F (36.6 C) (Oral)   Resp 14   Ht 5\' 7"  (1.702 m)   Wt 74.8 kg (165 lb)   SpO2 100%   BMI 25.84 kg/m   Physical Exam  Constitutional: He appears well-developed and well-nourished. No distress.  HENT:  Head: Normocephalic and atraumatic.  Mouth/Throat: Oropharynx is clear and moist.  Eyes: Conjunctivae are normal. Right eye exhibits no discharge. Left eye exhibits no discharge. No scleral icterus.  Neck: Normal range of motion. Neck supple.  Cardiovascular: Normal rate, normal heart sounds and intact distal pulses.   No murmur heard. Pulmonary/Chest: Effort normal and breath sounds normal. No stridor. No respiratory distress. He has no wheezes. He exhibits no tenderness.  Abdominal: Soft. He exhibits no distension. There is no tenderness. There is no guarding.  Musculoskeletal: He exhibits no edema or deformity.  Neurological: He is alert. No sensory deficit. He exhibits normal muscle tone.  Mental Status:  Alert, oriented, thought content appropriate, able to give a coherent history. Speech fluent without evidence of aphasia. Able to follow 2 step commands without difficulty.  Cranial Nerves:  II:  Peripheral visual fields grossly normal, pupils equal, round, reactive to light III,IV,  VI: ptosis not present, extra-ocular motions intact bilaterally  V,VII: smile symmetric, facial light touch sensation equal VIII: hearing grossly normal to voice  X: uvula elevates symmetrically  XI: bilateral shoulder shrug symmetric and strong XII: midline tongue extension without fassiculations Motor:  Normal tone. 5/5 in upper and lower extremities bilaterally including strong and equal grip strength and dorsiflexion/plantar flexion CV: distal pulses palpable throughout    Skin: Skin is warm and dry. He is not diaphoretic.  Psychiatric: He has a normal mood and affect. His behavior is normal.  Nursing note and vitals  reviewed.    ED Treatments / Results  Labs (all labs ordered are listed, but only abnormal results are displayed) Labs Reviewed  BASIC METABOLIC PANEL - Abnormal; Notable for the following:       Result Value   Sodium 126 (*)    Chloride 87 (*)    Glucose, Bld 102 (*)    BUN 45 (*)    Creatinine, Ser 2.37 (*)    Calcium 8.3 (*)    GFR calc non Af Amer 24 (*)    GFR calc Af Amer 28 (*)    All other components within normal limits  CBC - Abnormal; Notable for the following:    RBC 3.32 (*)    Hemoglobin 7.9 (*)    HCT 25.6 (*)    MCV 77.1 (*)    MCH 23.8 (*)    RDW 15.7 (*)    Platelets 135 (*)    All other components within normal limits  BRAIN NATRIURETIC PEPTIDE - Abnormal; Notable for the following:    B Natriuretic Peptide 1,436.0 (*)    All other components within normal limits  URINALYSIS, ROUTINE W REFLEX MICROSCOPIC  OSMOLALITY, URINE  OSMOLALITY  SODIUM, URINE, RANDOM  UREA NITROGEN, URINE  CREATININE, URINE, RANDOM  URINALYSIS, COMPLETE (UACMP) WITH MICROSCOPIC  VITAMIN B12  FOLATE  IRON AND TIBC  FERRITIN  RETICULOCYTES  BASIC METABOLIC PANEL  CBC  I-STAT TROPONIN, ED  CBG MONITORING, ED  POC OCCULT BLOOD, ED  TYPE AND SCREEN    EKG  EKG Interpretation None       Radiology Dg Chest 2 View  Result Date:  07/16/2017 CLINICAL DATA:  Multiple recent falls.  Pitting edema. EXAM: CHEST  2 VIEW COMPARISON:  Chest radiograph May 03, 2017 FINDINGS: Cardiac silhouette is mildly enlarged. Mediastinal silhouette is nonsuspicious. Calcified aortic arch. Status post CABG. Single lead LEFT cardiac defibrillator in situ. Low lung volumes with crowded vascular markings. Bibasilar strandy densities. Persistently elevated RIGHT hemidiaphragm. No pleural effusion or focal consolidation. Soft tissue planes and included osseous structures are nonsuspicious. Old LEFT rib fractures. IMPRESSION: Mild cardiomegaly.  Bibasilar atelectasis. Electronically Signed   By: Elon Alas M.D.   On: 07/16/2017 21:14   Ct Head Wo Contrast  Result Date: 07/16/2017 CLINICAL DATA:  Multiple falls over the past 2 days when using his walker. EXAM: CT HEAD WITHOUT CONTRAST TECHNIQUE: Contiguous axial images were obtained from the base of the skull through the vertex without intravenous contrast. COMPARISON:  Head CT 06/28/2015 FINDINGS: Brain: Generalized atrophy, normal for age. Mild chronic small vessel ischemia. Remote lacunar infarct in the left basal ganglia, unchanged. No intracranial hemorrhage, mass effect, or midline shift. No hydrocephalus. The basilar cisterns are patent. No evidence of territorial infarct or acute ischemia. No extra-axial or intracranial fluid collection. Vascular: Atherosclerosis of skullbase vasculature without hyperdense vessel or abnormal calcification. Skull: No skull fracture or focal lesion. Sinuses/Orbits: Paranasal sinuses and mastoid air cells are clear. The visualized orbits are unremarkable. Bilateral cataract resection. Other: None. IMPRESSION: 1. No acute intracranial abnormality. 2. Stable atrophy and chronic small vessel ischemia. Remote lacunar infarct in left basal ganglia. Electronically Signed   By: Jeb Levering M.D.   On: 07/16/2017 22:05    Procedures Procedures (including critical care  time)  Medications Ordered in ED    Initial Impression / Assessment and Plan / ED Course  I have reviewed the triage vital signs and the nursing notes.  Pertinent labs & imaging results that were available  during my care of the patient were reviewed by me and considered in my medical decision making (see chart for details).  Clinical Course as of Jul 16 2342  Nancy Fetter Jul 16, 2017  2238 Spoke with hospitalist who will come see patient before deciding to admit patient.   [EH]    Clinical Course User Index [EH] Lorin Glass, PA-C   [name] presents today for evaluation of chest pain, and falls. He reports that the chest pain occurred this morning and was relieved with nitroglycerin.  He has a extensive cardiac history including multiple bypasses, pacemaker/defibrillator present.  He also reports that over the past few days he has had multiple episodes when he feels like his legs become weak causing him to fall. CT head was obtained without acute abnormalities.  Labs were obtained and reviewed, significant for located creatinine (2.37), hyponatremia (126), Anemia (hemoglobin 7.9), chloride 87, BNP 1,436.  No troponin elevation.    Given multiple concerns patient was recommended for admission.  The patient appears reasonably stabilized for admission considering the current resources, flow, and capabilities available in the ED at this time, and I doubt any other [facility] requiring further screening and/or treatment in the ED prior to admission.  This patient was Seen and a shared visit with Dr.Yelverton who evaluated the patient and agreed with my plan.    Final Clinical Impressions(s) / ED Diagnoses   Final diagnoses:  Hyponatremia  AKI (acute kidney injury) ([place])  Chest pain, unspecified type  Chronic clinical systolic heart failure [facility])    New Prescriptions New Prescriptions   No medications on file     Ollen Gross 07/16/17 2346    Julianne Rice,  MD 07/18/17 1606

## 2017-07-16 NOTE — ED Triage Notes (Signed)
Per EMS, pt from home. Pt c/o having multiple falls the past 2 days with walker. Pt denies hitting head and no other injuries noted. Pt c/o coccyx pain from falling on bottom. Pt c/o cp earlier today, pt took a nitro which relieved his pain and denies cp at this time. Pt has hx of Afib, MIs, CHF and stents. Pt also reports some bilateral LE pitting edema. Pt was hospitalized and in rehab for 21 days about 2 weeks ago for swelling. Pt has a [place]. Pt wears 4L [place] at home.

## 2017-07-16 NOTE — H&P (Signed)
History and Physical    Victor [name] KGM:[id number] DOB: [birth year] DOA: 07/16/2017  PCP: Cyndi Bender, PA-C   Patient coming from: Home  Chief Complaint: Falls  HPI: Victor [name] is a 81 y.o. male with medical history significant for coronary artery disease status post CABG, chronic systolic CHF with ICD in place, insulin dependent diabetes mellitus, hypertension, chronic pain, and anemia, and into the emergency department for evaluation of recurrent falls. Patient typically ambulates with a walker, but over the past couple days has fallen multiple times due to his legs "giving out." He was hospitalized for acute CHF in June, has been following with cardiology, and had his Lasix doubled and metolazone added approximately one week ago. He reports good improvement in his edema with the medication change, and had been pleased with the results, but over the past couple days has experienced recurring episodes of his legs "giving out." He denies headache, change in vision or hearing, or focal numbness or weakness. No fevers or chills. Denies orthopnea, dyspnea, or cough.  ED Course: Upon arrival to the ED, patient is found to be afebrile, saturating well on his usual 4 L/m supplemental oxygen, and with vital signs otherwise stable. EKG features atrial fibrillation with low voltage QRS. Chest x-ray is notable for mild cardiomegaly but no acute process. Noncontrast head CT is negative for acute intracranial abnormality. Chemistry panel features a sodium of 126, chloride 87, BUN 45, and creatinine of 2.37, up from 0.87 in June of this year. CBC is notable for microcytic anemia with hemoglobin of 7.9, down from 10.1 in June. Platelets are slightly low at 135,000 and MCV is a little low at 77.1. Troponin is within the normal limits and BNP is elevated to 1436. Fecal occult blood testing was negative. Urinalysis has been ordered and remained pending. Patient remained hemodynamically stable and in no apparent  respiratory distress and the emergency department, and he will be admitted to the telemetry unit for ongoing evaluation and management of acute kidney injury and hyponatremia in a patient with chronic systolic heart failure on diuretics.   Review of Systems:  All other systems reviewed and apart from HPI, are negative.  Past Medical History:  Diagnosis Date  . Anemia   . Arthritis   . Automatic implantable cardioverter-defibrillator in situ   . Cardiomyopathy, ischemic    a. s/p ICD placement  . Chronic combined systolic and diastolic CHF (congestive heart failure) ([place])   . Chronic lower back pain   . CKD (chronic kidney disease), stage III   . Coronary artery disease    a. s/p CABG 1990. b. multiple PCIs since that time including CTO angioplasty (without stenting) PCI of RCA 09/2014, with last cath 02/2015 done for worsening CHF with unsuccessful attempt at PCI of the RCA due to inability to cross occlusion - medical therapy advised at that time.  Marland Kitchen GERD (gastroesophageal reflux disease)   . HLD (hyperlipidemia)   . Hypertension   . Occlusion and stenosis of carotid artery without mention of cerebral infarction   . Orthostatic hypotension   . Paroxysmal ventricular tachycardia ([place])   . Peripheral vascular disease ([place])   . Raynaud's syndrome   . Stroke [facility])    a. CT head 06/2015: Small old lacunar infarct LEFT basal ganglia.  . Thrombocytopenia ([place])   . TIA (transient ischemic attack)    a. s/p LHC on 10/01/14   . Type II diabetes mellitus ([place])     Past Surgical History:  Procedure  Laterality Date  . ANAL FISTULECTOMY  2000's  . CARDIAC CATHETERIZATION  07/2014  . CARDIAC CATHETERIZATION  10/01/2014   Procedure: CORONARY BALLOON ANGIOPLASTY;  Surgeon: Peter M Martinique, MD;  Location: [facility] CATH LAB;  Service: Cardiovascular;;  . CARDIAC DEFIBRILLATOR PLACEMENT  02/1989-12/2011   "I've had a total of 6 or 7 defibrillators put in" (09/30/2014)  . CATARACT EXTRACTION, BILATERAL  Bilateral ~ 2009  . CORONARY ANGIOPLASTY  10/01/14   PTVA to CTO with restoration of TIMI 3  . CORONARY ANGIOPLASTY WITH STENT PLACEMENT  04/2013   "1"  . CORONARY ARTERY BYPASS GRAFT  01/1989   "CABG X3"   . FINGER FRACTURE SURGERY Left ~ 2000   "pointer"  . FRACTURE SURGERY    . IMPLANTABLE CARDIOVERTER DEFIBRILLATOR (ICD) GENERATOR CHANGE Left 10/05/2011   Procedure: ICD GENERATOR CHANGE;  Surgeon: Evans Lance, MD;  Location: [facility] CATH LAB;  Service: Cardiovascular;  Laterality: Left;  . LEFT AND RIGHT HEART CATHETERIZATION WITH CORONARY/GRAFT ANGIOGRAM N/A 04/26/2013   Procedure: LEFT AND RIGHT HEART CATHETERIZATION WITH Beatrix Fetters;  Surgeon: Sherren Mocha, MD;  Location: [facility] CATH LAB;  Service: Cardiovascular;  Laterality: N/A;  . LEFT HEART CATHETERIZATION WITH CORONARY ANGIOGRAM N/A 08/01/2014   Procedure: LEFT HEART CATHETERIZATION WITH CORONARY ANGIOGRAM;  Surgeon: Sinclair Grooms, MD;  Location: [facility] CATH LAB;  Service: Cardiovascular;  Laterality: N/A;  . LEFT HEART CATHETERIZATION WITH CORONARY ANGIOGRAM N/A 03/20/2015   Procedure: LEFT HEART CATHETERIZATION WITH CORONARY ANGIOGRAM;  Surgeon: Peter M Martinique, MD;  Location: [facility] CATH LAB;  Service: Cardiovascular;  Laterality: N/A;  . PERCUTANEOUS STENT INTERVENTION  04/26/2013   Procedure: PERCUTANEOUS STENT INTERVENTION;  Surgeon: Sherren Mocha, MD;  Location: [facility] CATH LAB;  Service: Cardiovascular;;  . RIGHT HEART CATHETERIZATION  10/01/2014   Procedure: RIGHT HEART CATH;  Surgeon: Peter M Martinique, MD;  Location: [facility] CATH LAB;  Service: Cardiovascular;;     reports that he has quit smoking. His smoking use included Pipe. He quit after 3.00 years of use. He has never used smokeless tobacco. He reports that he drinks alcohol. He reports that he does not use drugs.  Allergies  Allergen Reactions  . Coreg [Carvedilol] Other (See Comments)    Shortness of breath ,fatigue ,dizzyness   . Lovastatin Other (See Comments)    CK  elevation, Myalgias  . Ativan [Lorazepam] Other (See Comments)    Pt. had side effects    Family History  Problem Relation Age of Onset  . Heart failure Mother 57  . Heart attack Father   . Leukemia Sister   . Stomach cancer Maternal Grandfather      Prior to Admission medications   Medication Sig Start Date End Date Taking? Authorizing Provider  aspirin (ASPIR-81) 81 MG EC tablet Take 81 mg by mouth at bedtime.     [provider]  bisoprolol (ZEBETA) 5 MG tablet Take 0.5 tablets (2.5 mg total) by mouth daily. 01/27/17   Martinique, Peter M, MD  clopidogrel (PLAVIX) 75 MG tablet Take 1 tablet (75 mg total) by mouth daily. 01/27/17   Martinique, Peter M, MD  dexamethasone (DECADRON) 4 MG tablet Take 4 mg by mouth daily as needed (back pain).  02/18/15   [provider]  DULoxetine (CYMBALTA) 30 MG capsule Take 30 mg by mouth daily. Take one capsule by mouth daily for 1 week then increase to 2 capsules daily.     [provider]  furosemide (LASIX) 40 MG tablet Take 1  tablet (40 mg total) by mouth daily. 01/27/17   Martinique, Peter M, MD  HYDROcodone-acetaminophen [facility]) 10-325 MG per tablet Take 1 tablet by mouth every 8 (eight) hours as needed for moderate pain.  01/21/15   [provider]  LEVEMIR FLEXTOUCH 100 UNIT/ML Pen INJECT 30 UNITS UNDER SKIN [facility] MORNING 04/15/16   [provider]  losartan (COZAAR) 50 MG tablet Take 0.5 tablets (25 mg total) by mouth daily. 05/23/17 05/13/19  Martinique, Peter M, MD  LYRICA 150 MG capsule Take 150 mg by mouth daily.  02/03/15   [provider]  metFORMIN (GLUCOPHAGE) 1000 MG tablet Take 1,000 mg by mouth 2 (two) times daily with a meal.    [provider]  mupirocin ointment (BACTROBAN) 2 % Apply 1 application topically 2 (two) times daily. 06/24/15   [provider]  nitroGLYCERIN (NITROSTAT) 0.4 MG SL tablet Place 0.4 mg under the tongue every 5 (five) minutes as needed for chest pain.    [provider]  omeprazole (PRILOSEC) 20 MG capsule TAKE 1 CAPSULE BY MOUTH ONCE DAILY 03/28/17   Martinique, Peter M, MD  ONE TOUCH ULTRA TEST test strip CHECK SUGAR AS DIRECTED 3 TIMES DAILY 05/24/15   [provider]  pravastatin (PRAVACHOL) 40 MG tablet Take 1 tablet (40 mg total) by mouth at bedtime. 05/16/16   Martinique, Peter M, MD  SANTYL ointment Apply 1 application topically every other day. 03/04/15   [provider]  traZODone (DESYREL) 50 MG tablet Take 1 tablet by mouth at bedtime. 08/10/16   [provider]  vitamin B-12 (CYANOCOBALAMIN) 1000 MCG tablet Take 1,000 mcg by mouth daily.    [provider]    Physical Exam: Vitals:   07/16/17 2009 07/16/17 2010 07/16/17 2136 07/16/17 2200  BP: (!) 114/91  118/87 (!) 103/52  Pulse: 70  66 60  Resp: 20  12 14   Temp: 97.9 F (36.6 C)     TempSrc: Oral     SpO2: 92%  100% 100%  Weight:  74.8 kg (165 lb)    Height:  5\' 7"  (1.702 m)        Constitutional: NAD, calm, appears chronically-ill Eyes: PERTLA, lids and conjunctivae normal ENMT: Mucous membranes are moist. Posterior pharynx clear of any exudate or lesions.   Neck: normal, supple, no masses, no thyromegaly Respiratory: clear to auscultation bilaterally, no wheezing, no crackles. Normal respiratory effort.   Cardiovascular: Rate ~60 and irregularly irregular. Trace BLE edema. No significant JVD. Abdomen: No distension, no tenderness, no masses palpated. Bowel sounds normal.  Musculoskeletal: no clubbing / cyanosis. No joint deformity upper and lower extremities.   Skin: no significant rashes, lesions, ulcers. Warm, dry, well-perfused. Neurologic: CN 2-12 grossly intact. Sensation intact, DTR normal. Strength 5/5 in all 4 limbs.  Psychiatric: Alert and oriented x 3. Pleasant and cooperative.     Labs on Admission: I have personally reviewed following labs and imaging studies  CBC:  Recent Labs Lab 07/16/17 2041  WBC 8.4  HGB 7.9*  HCT  25.6*  MCV 77.1*  PLT 938*   Basic Metabolic Panel:  Recent Labs Lab 07/16/17 2041  NA 126*  K 4.9  CL 87*  CO2 28  GLUCOSE 102*  BUN 45*  CREATININE 2.37*  CALCIUM 8.3*   GFR: Estimated Creatinine Clearance: 23.6 mL/min (A) (by C-G formula based on SCr of 2.37 mg/dL (H)). Liver Function Tests: No results for input(s): AST, ALT, ALKPHOS, BILITOT, PROT, ALBUMIN in the last  168 hours. No results for input(s): LIPASE, AMYLASE in the last 168 hours. No results for input(s): AMMONIA in the last 168 hours. Coagulation Profile: No results for input(s): INR, PROTIME in the last 168 hours. Cardiac Enzymes: No results for input(s): CKTOTAL, CKMB, CKMBINDEX, TROPONINI in the last 168 hours. BNP (last 3 results) No results for input(s): PROBNP in the last 8760 hours. HbA1C: No results for input(s): HGBA1C in the last 72 hours. CBG:  Recent Labs Lab 07/16/17 2033  GLUCAP 92   Lipid Profile: No results for input(s): CHOL, HDL, LDLCALC, TRIG, CHOLHDL, LDLDIRECT in the last 72 hours. Thyroid Function Tests: No results for input(s): TSH, T4TOTAL, FREET4, T3FREE, THYROIDAB in the last 72 hours. Anemia Panel: No results for input(s): VITAMINB12, FOLATE, FERRITIN, TIBC, IRON, RETICCTPCT in the last 72 hours. Urine analysis: No results found for: COLORURINE, APPEARANCEUR, LABSPEC, PHURINE, GLUCOSEU, HGBUR, BILIRUBINUR, KETONESUR, PROTEINUR, UROBILINOGEN, NITRITE, LEUKOCYTESUR Sepsis Labs: @LABRCNTIP (procalcitonin:4,lacticidven:4) )No results found for this or any previous visit (from the past 240 hour(s)).   Radiological Exams on Admission: Dg Chest 2 View  Result Date: 07/16/2017 CLINICAL DATA:  Multiple recent falls.  Pitting edema. EXAM: CHEST  2 VIEW COMPARISON:  Chest radiograph May 03, 2017 FINDINGS: Cardiac silhouette is mildly enlarged. Mediastinal silhouette is nonsuspicious. Calcified aortic arch. Status post CABG. Single lead LEFT cardiac defibrillator in situ. Low lung  volumes with crowded vascular markings. Bibasilar strandy densities. Persistently elevated RIGHT hemidiaphragm. No pleural effusion or focal consolidation. Soft tissue planes and included osseous structures are nonsuspicious. Old LEFT rib fractures. IMPRESSION: Mild cardiomegaly.  Bibasilar atelectasis. Electronically Signed   By: Elon Alas M.D.   On: 07/16/2017 21:14   Ct Head Wo Contrast  Result Date: 07/16/2017 CLINICAL DATA:  Multiple falls over the past 2 days when using his walker. EXAM: CT HEAD WITHOUT CONTRAST TECHNIQUE: Contiguous axial images were obtained from the base of the skull through the vertex without intravenous contrast. COMPARISON:  Head CT 06/28/2015 FINDINGS: Brain: Generalized atrophy, normal for age. Mild chronic small vessel ischemia. Remote lacunar infarct in the left basal ganglia, unchanged. No intracranial hemorrhage, mass effect, or midline shift. No hydrocephalus. The basilar cisterns are patent. No evidence of territorial infarct or acute ischemia. No extra-axial or intracranial fluid collection. Vascular: Atherosclerosis of skullbase vasculature without hyperdense vessel or abnormal calcification. Skull: No skull fracture or focal lesion. Sinuses/Orbits: Paranasal sinuses and mastoid air cells are clear. The visualized orbits are unremarkable. Bilateral cataract resection. Other: None. IMPRESSION: 1. No acute intracranial abnormality. 2. Stable atrophy and chronic small vessel ischemia. Remote lacunar infarct in left basal ganglia. Electronically Signed   By: Jeb Levering M.D.   On: 07/16/2017 22:05    EKG: Independently reviewed. Atrial fibrillation, low-voltage QRS.   Assessment/Plan  1. Acute kidney injury  - SCr is 2.37 on admission, up from 0.87 in June 2018  - Likely secondary to recent doubling of Lasix and addition of metolazone  - Plan to hold diuretics and ARB, allow ad lip PO fluids, hold losartan, check urine studies and renal US, avoid  nephrotoxins where possible, and renally-dose medications as needed  - Repeat chemistries in am   2. Hyponatremia  - Serum sodium is 126 on admission - Likely secondary to recent increase in diuretics - Check osmolality, hold diuretics, repeat chemistries in am   3. Microcytic anemia  - Hgb is 7.9 on admission with MCV 77.1  - Hgb was 10.1 in June 2018  - Pt denies melena, FOBT neg  in ED  - Check anemia panel, continue B-12 supplementation, trend H&H  4. CAD - Status-post CABG  - Reports occasional angina relieved by NTG  - No anginal complaint on admission  - Continue ASA, Plavix, beta-blocker, and statin; hold ARB until renal fxn stabilizes    5. Chronic systolic CHF  - Appears roughly euvolemic on admission  - TTE (05/03/17) with EF 35-40%, inferoposterior wall akinesis, mild TR, mild-mod MR, and mild pulm HTN  - Wt is 165 on admission, up from 150 in June '18, and up from 157 in July '18  - Managed at home with Lasix 40 mg qD, bisoprolol, and losartan; had Lasix doubled and metolazone added 1 wk prior to presentation  - Plan to SLIV, follow strict I/O's, allow ad lip PO fluids, hold diuretics and ARB, continue beta-blocker, follow daily chem panel   6. Insulin-dependent DM  - A1c was 9.3% in 2016 - Managed at home with metformin and Levemir 30 units qD  - Plan to check CBG's with meals and qHS  - Continue Levemir at 20 units qD with a low-intensity SSI   7. Hypertension - BP at goal  - Continue bisoprolol as tolerated  - Hold losartan until renal fxn stabilizes   8. Chronic pain - Stable  - Continue Lyrica and Norco prn    DVT prophylaxis: SCD's Code Status: Full  Family Communication: Discussed with patient Disposition Plan: Admit to telemetry Consults called: None Admission status: Inpatient    Vianne Bulls, MD [facility] Pager [phone]  If 7PM-7AM, please contact night-coverage [email or web address] Password [facility]  07/16/2017, 10:49 PM

## 2017-07-17 ENCOUNTER — Inpatient Hospital Stay (HOSPITAL_COMMUNITY): Payer: Medicare Other

## 2017-07-17 DIAGNOSIS — I10 Essential (primary) hypertension: Secondary | ICD-10-CM

## 2017-07-17 DIAGNOSIS — R079 Chest pain, unspecified: Secondary | ICD-10-CM

## 2017-07-17 DIAGNOSIS — N179 Acute kidney failure, unspecified: Principal | ICD-10-CM

## 2017-07-17 LAB — OSMOLALITY: Osmolality: 291 mOsm/kg (ref 275–295)

## 2017-07-17 LAB — CBC
HCT: 25.5 % — ABNORMAL LOW (ref 39.0–52.0)
Hemoglobin: 7.8 g/dL — ABNORMAL LOW (ref 13.0–17.0)
MCH: 23.6 pg — AB (ref 26.0–34.0)
MCHC: 30.6 g/dL (ref 30.0–36.0)
MCV: 77 fL — ABNORMAL LOW (ref 78.0–100.0)
PLATELETS: 152 10*3/uL (ref 150–400)
RBC: 3.31 MIL/uL — ABNORMAL LOW (ref 4.22–5.81)
RDW: 15.7 % — ABNORMAL HIGH (ref 11.5–15.5)
WBC: 7.1 10*3/uL (ref 4.0–10.5)

## 2017-07-17 LAB — IRON AND TIBC
Iron: 16 ug/dL — ABNORMAL LOW (ref 45–182)
SATURATION RATIOS: 5 % — AB (ref 17.9–39.5)
TIBC: 322 ug/dL (ref 250–450)
UIBC: 306 ug/dL

## 2017-07-17 LAB — GLUCOSE, CAPILLARY
GLUCOSE-CAPILLARY: 122 mg/dL — AB (ref 65–99)
Glucose-Capillary: 114 mg/dL — ABNORMAL HIGH (ref 65–99)
Glucose-Capillary: 193 mg/dL — ABNORMAL HIGH (ref 65–99)
Glucose-Capillary: 183 mg/dL — ABNORMAL HIGH (ref 65–99)
Glucose-Capillary: 113 mg/dL — ABNORMAL HIGH (ref 65–99)

## 2017-07-17 LAB — URINALYSIS, COMPLETE (UACMP) WITH MICROSCOPIC
BACTERIA UA: NONE SEEN
BILIRUBIN URINE: NEGATIVE
Glucose, UA: NEGATIVE mg/dL
HGB URINE DIPSTICK: NEGATIVE
Ketones, ur: NEGATIVE mg/dL
Nitrite: NEGATIVE
PROTEIN: NEGATIVE mg/dL
RBC / HPF: NONE SEEN RBC/hpf (ref 0–5)
SPECIFIC GRAVITY, URINE: 1.008 (ref 1.005–1.030)
SQUAMOUS EPITHELIAL / LPF: NONE SEEN
pH: 5 (ref 5.0–8.0)

## 2017-07-17 LAB — BASIC METABOLIC PANEL
Anion gap: 8 (ref 5–15)
BUN: 46 mg/dL — ABNORMAL HIGH (ref 6–20)
CALCIUM: 8.4 mg/dL — AB (ref 8.9–10.3)
CO2: 32 mmol/L (ref 22–32)
CREATININE: 2.32 mg/dL — AB (ref 0.61–1.24)
Chloride: 90 mmol/L — ABNORMAL LOW (ref 101–111)
GFR calc Af Amer: 29 mL/min — ABNORMAL LOW (ref >60)
GFR calc non Af Amer: 25 mL/min — ABNORMAL LOW (ref >60)
GLUCOSE: 106 mg/dL — AB (ref 65–99)
Potassium: 4.6 mmol/L (ref 3.5–5.1)
Sodium: 130 mmol/L — ABNORMAL LOW (ref 135–145)

## 2017-07-17 LAB — RETICULOCYTES
RBC.: 3.33 MIL/uL — AB (ref 4.22–5.81)
RETIC COUNT ABSOLUTE: 53.3 10*3/uL (ref 19.0–186.0)
RETIC CT PCT: 1.6 % (ref 0.4–3.1)

## 2017-07-17 LAB — CREATININE, URINE, RANDOM
CREATININE, URINE: 51.83 mg/dL
Creatinine, Urine: 46.57 mg/dL

## 2017-07-17 LAB — SODIUM, URINE, RANDOM
Sodium, Ur: 31 mmol/L
Sodium, Ur: 36 mmol/L

## 2017-07-17 LAB — VITAMIN B12: VITAMIN B 12: 1479 pg/mL — AB (ref 180–914)

## 2017-07-17 LAB — FOLATE: FOLATE: 9.8 ng/mL (ref >5.9)

## 2017-07-17 LAB — OSMOLALITY, URINE: OSMOLALITY UR: 256 mosm/kg — AB (ref 300–900)

## 2017-07-17 LAB — FERRITIN: Ferritin: 53 ng/mL (ref 24–336)

## 2017-07-17 MED ORDER — ENSURE ENLIVE PO LIQD
237.0000 mL | Freq: Two times a day (BID) | ORAL | Status: DC
  Administered 2017-07-17 – 2017-07-20 (×6): 237 mL via ORAL

## 2017-07-17 MED ORDER — SODIUM CHLORIDE 0.9 % IV SOLN
INTRAVENOUS | Status: DC
  Administered 2017-07-17: 13:00:00 via INTRAVENOUS

## 2017-07-17 MED ORDER — FUROSEMIDE 10 MG/ML IJ SOLN
40.0000 mg | Freq: Two times a day (BID) | INTRAMUSCULAR | Status: DC
  Administered 2017-07-17 – 2017-07-19 (×4): 40 mg via INTRAVENOUS
  Filled 2017-07-17 (×4): qty 4

## 2017-07-17 NOTE — Progress Notes (Addendum)
Pt is alert and oriented forgetful, has had multiple falls, weak unsteady balance, Vitals stable.Delayed stream To urinate.

## 2017-07-17 NOTE — Progress Notes (Signed)
Informed pt of UA needed Clean urinal at bedside  Educated pt and family

## 2017-07-17 NOTE — Progress Notes (Signed)
PROGRESS NOTE    Victor [name]  HQI:[id number] DOB: 01-13-1936 DOA: 07/16/2017 PCP: Cyndi Bender, PA-C   Brief Narrative:  Victor [name] is a 81 y.o. male with medical history significant for coronary artery disease status post CABG, chronic systolic CHF with ICD in place, insulin dependent diabetes mellitus, hypertension, chronic pain, and anemia, and into the emergency department for evaluation of recurrent falls. Patient typically ambulates with a walker, but over the past couple days has fallen multiple times due to his legs "giving out." He was hospitalized for acute CHF in June, has been following with cardiology, and had his Lasix doubled and metolazone added approximately one week ago. He reports good improvement in his edema with the medication change, and had been pleased with the results, but over the past couple days has experienced recurring episodes of his legs "giving out." He denies headache, change in vision or hearing, or focal numbness or weakness. No fevers or chills. Denies orthopnea, dyspnea, or cough.   Assessment & Plan:   Principal Problem:   AKI (acute kidney injury) ([place]) Active Problems:   Insulin dependent type 2 diabetes mellitus, controlled (HCC)   Chronic clinical systolic heart failure (HCC)   Microcytic anemia   Essential hypertension   Hx of CABG 1990   OSA (obstructive sleep apnea)   Emphysema of lung (HCC)   Hyponatremia   Chest pain  #1 acute kidney injury Questionable etiology. Creatinine was 0.87 in June 2018. Patient does have volume overload on examination and may be secondary to an acute CHF exacerbation versus up titration of medications. Check a urine sodium, check a urine creatinine. Renal ultrasound done negative for hydronephrosis. Patient's diuretics and ACE inhibitor/ARB were held on admission. Patient on gentle hydration. Saline lock IV fluids. Patient may need diuretics. Cardiology consultation pending.  #2 acute on chronic combined  systolic and diastolic heart failure Patient seems volume overloaded on examination with lower extremity edema and diffuse crackles on clinical examination. Saline lock IV fluids. 2-D echo pending. Patient may need diuretics although patient with a worsening renal function. Continues to zebeta, aspirin and Plavix. May need diuretics however cardiology consultation pending and will defer to cardiology.  #3 hypertension Continue current regimen of zebeta.  #4 insulin-dependent diabetes mellitus type 2 Check a hemoglobin A1c. CBGs have ranged from 114-193. Continue Levemir and sliding scale insulin.  #5 mitral regurgitation 2-D echo has been ordered per cardiology.  #6 history of ischemic cardiomyopathy with history of V. tach in 2012 status post ICD Continue beta blocker. Per cardiology.  #7 hyperlipidemia Continue statin.  #8 hyponatremia Likely secondary to hypervolemic hyponatremia. Patient is volume overloaded on examination. Urine studies pending. Patient likely to be placed on diuretics per cardiology starting tomorrow. Follow.  #9 coronary artery disease status post CABG No current chest pain.  Continue cardiac regimen of aspirin, statin, Plavix,zebeta.    DVT prophylaxis: SCDs Code Status: Full Family Communication: Updated patient. No family at bedside. Disposition Plan: Home once renal function has improved and close to baseline. Volume overload has improved.   Consultants:   Cardiology pending  Procedures:   CT head 07/16/2017  Chest x-ray 26 2018  Renal ultrasound 07/17/2017  Antimicrobials:   None   Subjective: Patient denies shortness of breath. No chest pain. No nausea or vomiting.  Objective: Vitals:   07/17/17 0500 07/17/17 0800 07/17/17 1339 07/17/17 1409  BP: 137/69 (!) 117/54 103/64 (!) 115/43  Pulse:   68 63  Resp:    18  Temp: 97.6 F (36.4 C) 98.1 F (36.7 C) 98 F (36.7 C) 98.2 F (36.8 C)  TempSrc: Oral Oral Oral Oral  SpO2: 100%  100%  100%  Weight:      Height:        Intake/Output Summary (Last 24 hours) at 07/17/17 1923 Last data filed at 07/17/17 1819  Gross per 24 hour  Intake           470.83 ml  Output             1650 ml  Net         -1179.17 ml   Filed Weights   07/16/17 2010 07/17/17 0042  Weight: 74.8 kg (165 lb) 74.5 kg (164 lb 4.8 oz)    Examination:  General exam: Appears calm and comfortable  Respiratory system: Diffuse crackles/coarse for sounds noted. No wheezing.  Respiratory effort normal. Cardiovascular system: S1 & S2 heard, RRR. No JVD, murmurs, rubs, gallops or clicks. 1+ bilateral lower extremity edema. Gastrointestinal system: Abdomen is nondistended, soft and nontender. No organomegaly or masses felt. Normal bowel sounds heard. Central nervous system: Alert and oriented. No focal neurological deficits. Extremities: Symmetric 5 x 5 power. Skin: No rashes, lesions or ulcers Psychiatry: Judgement and insight appear normal. Mood & affect appropriate.     Data Reviewed: I have personally reviewed following labs and imaging studies  CBC:  Recent Labs Lab 07/16/17 2041 07/17/17 0040  WBC 8.4 7.1  HGB 7.9* 7.8*  HCT 25.6* 25.5*  MCV 77.1* 77.0*  PLT 135* 532   Basic Metabolic Panel:  Recent Labs Lab 07/16/17 2041 07/17/17 0040  NA 126* 130*  K 4.9 4.6  CL 87* 90*  CO2 28 32  GLUCOSE 102* 106*  BUN 45* 46*  CREATININE 2.37* 2.32*  CALCIUM 8.3* 8.4*   GFR: Estimated Creatinine Clearance: 24.1 mL/min (A) (by C-G formula based on SCr of 2.32 mg/dL (H)). Liver Function Tests: No results for input(s): AST, ALT, ALKPHOS, BILITOT, PROT, ALBUMIN in the last 168 hours. No results for input(s): LIPASE, AMYLASE in the last 168 hours. No results for input(s): AMMONIA in the last 168 hours. Coagulation Profile: No results for input(s): INR, PROTIME in the last 168 hours. Cardiac Enzymes: No results for input(s): CKTOTAL, CKMB, CKMBINDEX, TROPONINI in the last 168  hours. BNP (last 3 results) No results for input(s): PROBNP in the last 8760 hours. HbA1C: No results for input(s): HGBA1C in the last 72 hours. CBG:  Recent Labs Lab 07/16/17 2033 07/17/17 0102 07/17/17 0811 07/17/17 1158 07/17/17 1611  GLUCAP 92 114* 122* 193* 183*   Lipid Profile: No results for input(s): CHOL, HDL, LDLCALC, TRIG, CHOLHDL, LDLDIRECT in the last 72 hours. Thyroid Function Tests: No results for input(s): TSH, T4TOTAL, FREET4, T3FREE, THYROIDAB in the last 72 hours. Anemia Panel:  Recent Labs  07/17/17 0040  VITAMINB12 1,479*  FOLATE 9.8  FERRITIN 53  TIBC 322  IRON 16*  RETICCTPCT 1.6   Sepsis Labs: No results for input(s): PROCALCITON, LATICACIDVEN in the last 168 hours.  No results found for this or any previous visit (from the past 240 hour(s)).       Radiology Studies: Dg Chest 2 View  Result Date: 07/16/2017 CLINICAL DATA:  Multiple recent falls.  Pitting edema. EXAM: CHEST  2 VIEW COMPARISON:  Chest radiograph May 03, 2017 FINDINGS: Cardiac silhouette is mildly enlarged. Mediastinal silhouette is nonsuspicious. Calcified aortic arch. Status post CABG. Single lead LEFT cardiac defibrillator in situ. Low lung volumes  with crowded vascular markings. Bibasilar strandy densities. Persistently elevated RIGHT hemidiaphragm. No pleural effusion or focal consolidation. Soft tissue planes and included osseous structures are nonsuspicious. Old LEFT rib fractures. IMPRESSION: Mild cardiomegaly.  Bibasilar atelectasis. Electronically Signed   By: Elon Alas M.D.   On: 07/16/2017 21:14   Ct Head Wo Contrast  Result Date: 07/16/2017 CLINICAL DATA:  Multiple falls over the past 2 days when using his walker. EXAM: CT HEAD WITHOUT CONTRAST TECHNIQUE: Contiguous axial images were obtained from the base of the skull through the vertex without intravenous contrast. COMPARISON:  Head CT 06/28/2015 FINDINGS: Brain: Generalized atrophy, normal for age. Mild  chronic small vessel ischemia. Remote lacunar infarct in the left basal ganglia, unchanged. No intracranial hemorrhage, mass effect, or midline shift. No hydrocephalus. The basilar cisterns are patent. No evidence of territorial infarct or acute ischemia. No extra-axial or intracranial fluid collection. Vascular: Atherosclerosis of skullbase vasculature without hyperdense vessel or abnormal calcification. Skull: No skull fracture or focal lesion. Sinuses/Orbits: Paranasal sinuses and mastoid air cells are clear. The visualized orbits are unremarkable. Bilateral cataract resection. Other: None. IMPRESSION: 1. No acute intracranial abnormality. 2. Stable atrophy and chronic small vessel ischemia. Remote lacunar infarct in left basal ganglia. Electronically Signed   By: Jeb Levering M.D.   On: 07/16/2017 22:05   US Renal  Result Date: 07/17/2017 CLINICAL DATA:  Acute kidney injury. EXAM: RENAL / URINARY TRACT ULTRASOUND COMPLETE COMPARISON:  None. FINDINGS: Right Kidney: Length: 10.2 cm. Echogenicity within normal limits. No mass or hydronephrosis visualized. Left Kidney: Length: 10.4 cm. Echogenicity within normal limits. No mass or hydronephrosis visualized. Bladder: Appears normal for degree of bladder distention. Other: Enlarged spleen measuring 14.2 cm in length, 13.2 cm in width and 8.0 cm in AP diameter with a calculated volume of 750 cc. IMPRESSION: 1. Normal appearing kidneys with no hydronephrosis. 2. Splenomegaly. Electronically Signed   By: Claudie Revering M.D.   On: 07/17/2017 11:45        Scheduled Meds: . aspirin EC  81 mg Oral Daily  . bisoprolol  2.5 mg Oral Daily  . clopidogrel  75 mg Oral Daily  . DULoxetine  60 mg Oral Daily  . feeding supplement (ENSURE ENLIVE)  237 mL Oral BID BM  . insulin aspart  0-5 Units Subcutaneous QHS  . insulin aspart  0-9 Units Subcutaneous TID WC  . insulin detemir  20 Units Subcutaneous Daily  . pravastatin  40 mg Oral q1800  . pregabalin  150 mg  Oral Daily  . sodium chloride flush  3 mL Intravenous Q12H  . traZODone  50 mg Oral QHS  . vitamin B-12  1,000 mcg Oral Daily   Continuous Infusions: . sodium chloride 50 mL/hr at 07/17/17 1323     LOS: 1 day    Time spent: 88 minutes    Aalijah Mims, MD [facility] Pager [phone]  If 7PM-7AM, please contact night-coverage [email or web address] Password [facility] 07/17/2017, 7:23 PM

## 2017-07-17 NOTE — Progress Notes (Signed)
Physical therapy informed RN of pt complains of tingling in fingers and neck Informed MD  MD aware MD also aware pt crackling lower base lungs ascultation and stated to start fluids at a rate of 50

## 2017-07-17 NOTE — Evaluation (Signed)
Physical Therapy Evaluation Patient Details Name: Victor [name] MRN: [id number] DOB: [birth year] Today's Date: 07/17/2017   History of Present Illness  Victor [name] is a 81 y.o. male with medical history significant for coronary artery disease status post CABG, chronic systolic CHF with ICD in place, insulin dependent diabetes mellitus, hypertension, chronic pain, and anemia, and into the emergency department for evaluation of recurrent falls. Patient typically ambulates with a walker, but over the past couple days has fallen multiple times due to his legs "giving out." He was hospitalized for acute CHF in June, has been following with cardiology, and had his Lasix doubled and metolazone added approximately one week ago. He reports good improvement in his edema with the medication change, and had been pleased with the results, but over the past couple days has experienced recurring episodes of his legs "giving out."   Past Medical History:  Diagnosis Date  . Anemia   . Arthritis   . Automatic implantable cardioverter-defibrillator in situ   . Cardiomyopathy, ischemic    a. s/p ICD placement  . Chronic combined systolic and diastolic CHF (congestive heart failure) ([place])   . Chronic lower back pain   . CKD (chronic kidney disease), stage III   . Coronary artery disease    a. s/p CABG 1990. b. multiple PCIs since that time including CTO angioplasty (without stenting) PCI of RCA 09/2014, with last cath 02/2015 done for worsening CHF with unsuccessful attempt at PCI of the RCA due to inability to cross occlusion - medical therapy advised at that time.  Marland Kitchen GERD (gastroesophageal reflux disease)   . HLD (hyperlipidemia)   . Hypertension   . Occlusion and stenosis of carotid artery without mention of cerebral infarction   . Orthostatic hypotension   . Paroxysmal ventricular tachycardia ([place])   . Peripheral vascular disease ([place])   . Raynaud's syndrome   . Stroke [facility])    a. CT head 06/2015: Small  old lacunar infarct LEFT basal ganglia.  . Thrombocytopenia ([place])   . TIA (transient ischemic attack)    a. s/p LHC on 10/01/14   . Type II diabetes mellitus ([place])     Clinical Impression  Pt admitted with above diagnosis. Pt currently with functional limitations due to the deficits listed below (see PT Problem List). Pt was able to ambulate but had not gotten far with ambulation with RW before pt had symptoms of neck/head pressure and shoulder pain and which progressed to bil hand/finger tingling. Pt states a hx of disc pathology but states that heart MD stated that pt cannot withstand surgery. Alerted nurse as to pts symptoms as he is having issues with UE and LES that were affecting his mobility with ambulation.  Will follow as pt can tolerate.   Pt will benefit from skilled PT to increase their independence and safety with mobility to allow discharge to the venue listed below.      Follow Up Recommendations Home health PT;Supervision/Assistance - 24 hour    Equipment Recommendations  None recommended by PT    Recommendations for Other Services       Precautions / Restrictions Precautions Precautions: Fall Restrictions Weight Bearing Restrictions: No      Mobility  Bed Mobility Overal bed mobility: Independent                Transfers Overall transfer level: Needs assistance Equipment used: Rolling walker (2 wheeled) Transfers: Sit to/from Stand Sit to Stand: Min assist;+2 safety/equipment  General transfer comment: Pt needed steadying assist for static stance with RW  Ambulation/Gait Ambulation/Gait assistance: Min assist;Mod assist;+2 physical assistance Ambulation Distance (Feet): 125 Feet Assistive device: Rolling walker (2 wheeled) Gait Pattern/deviations: Step-through pattern;Decreased stride length;Drifts right/left;Wide base of support   Gait velocity interpretation: Below normal speed for age/gender General Gait Details: Pt needed assist to  steer RW and cues for sequencing RW.  Pt was able to ambulate with RW with min assist overall with incr assist close to mod to balance pt when he has symptoms with LEs "giving out". Pt c/o head neck and shoulder pain and pressure and then back pain and became unsteady needing assist to steady.  Sat pt down as followed with chair and then pt states fingers and hands numb.  This was a new symptom. Notified nurse to ask if neuro consult would be warranted.   Stairs            Wheelchair Mobility    Modified Rankin (Stroke Patients Only)       Balance Overall balance assessment: Needs assistance;History of Falls Sitting-balance support: No upper extremity supported;Feet supported Sitting balance-Leahy Scale: Good     Standing balance support: Bilateral upper extremity supported;During functional activity Standing balance-Leahy Scale: Poor Standing balance comment: relies on RW and min  assist for balance in standing                              Pertinent Vitals/Pain Pain Assessment: Faces Faces Pain Scale: Hurts even more Pain Location: shoulder and head/neck Pain Descriptors / Indicators: Grimacing;Guarding;Sore Pain Intervention(s): Limited activity within patient's tolerance;Monitored during session;Repositioned  VSS on 3LO2 with ambulation  Home Living Family/patient expects to be discharged to:: Private residence Living Arrangements: Alone [name], neighbor assists pt per granddaughter) Available Help at Discharge: Family;Available PRN/intermittently (Daughter can stay days but pt will be alone at night) Type of Home: House Home Access: Stairs to enter Entrance Stairs-Rails: Right;Left;Can reach both Entrance Stairs-Number of Steps: 4 Home Layout: One level Home Equipment: Walker - 2 wheels;Walker - 4 wheels;Bedside commode;Shower seat;Electric scooter [facility])      Prior Function Level of Independence: Independent with assistive device(s)          Comments: has been using rollator outside, RW inside and scooter long distances     Hand Dominance   Dominant Hand: Right    Extremity/Trunk Assessment   Upper Extremity Assessment Upper Extremity Assessment: Defer to OT evaluation    Lower Extremity Assessment Lower Extremity Assessment: Generalized weakness    Cervical / Trunk Assessment Cervical / Trunk Assessment: Kyphotic  Communication   Communication: No difficulties  Cognition Arousal/Alertness: Awake/alert Behavior During Therapy: WFL for tasks assessed/performed Overall Cognitive Status: Within Functional Limits for tasks assessed                                        General Comments      Exercises     Assessment/Plan    PT Assessment Patient needs continued PT services  PT Problem List Decreased activity tolerance;Decreased balance;Decreased mobility;Decreased knowledge of use of DME;Decreased safety awareness;Decreased knowledge of precautions       PT Treatment Interventions DME instruction;Gait training;Functional mobility training;Therapeutic activities;Therapeutic exercise;Balance training;Patient/family education;Stair training    PT Goals (Current goals can be found in the Care Plan section)  Acute Rehab PT Goals  Patient Stated Goal: to go home PT Goal Formulation: With patient Time For Goal Achievement: 07/31/17 Potential to Achieve Goals: Good    Frequency Min 3X/week   Barriers to discharge        Co-evaluation               AM-PAC PT "6 Clicks" Daily Activity  Outcome Measure Difficulty turning over in bed (including adjusting bedclothes, sheets and blankets)?: None Difficulty moving from lying on back to sitting on the side of the bed? : None Difficulty sitting down on and standing up from a chair with arms (e.g., wheelchair, bedside commode, etc,.)?: A Little Help needed moving to and from a bed to chair (including a wheelchair)?: A Little Help needed  walking in hospital room?: A Lot Help needed climbing 3-5 steps with a railing? : Total 6 Click Score: 17    End of Session Equipment Utilized During Treatment: Gait belt;Oxygen Activity Tolerance: Patient limited by fatigue (limited by tingling hand/fingers and neck/shoulder pain) Patient left: in chair;with call bell/phone within reach;with chair alarm set;with family/visitor present Nurse Communication: Mobility status (notified of neuro symptoms) PT Visit Diagnosis: Unsteadiness on feet (R26.81);Muscle weakness (generalized) (M62.81);Repeated falls (R29.6);History of falling (Z91.81)    Time: 5456-2563 PT Time Calculation (min) (ACUTE ONLY): 19 min   Charges:   PT Evaluation $PT Eval Moderate Complexity: 1 Mod     PT G Codes:        Kadrian Partch,PT Acute Rehabilitation [phone] (pager)   Denice Paradise 07/17/2017, 1:36 PM

## 2017-07-17 NOTE — Consult Note (Signed)
Cardiology Consultation:   Patient ID: RODRIQUES BADIE; [id number]; 04-20-1936   Admit date: 07/16/2017 Date of Consult: 07/17/2017  Primary Care Provider: Cyndi Bender, PA-C Primary Cardiologist: Dr. Martinique Primary Electrophysiologist:  Dr. Cristopher Peru  Patient Profile:   Victor [name] is a 81 y.o. male with a hx of CAD (s/p CABG in 1990 with known occlusive of both vein grafts, PCI to the SVG-OM3 in 2014, and attempted CTO PCI of the RCA 09/2014). Chronic combined systolic and diastolic CHF, ischemic cardiomyopathy with hx of VT in 2012, s/p ICD, diabetes, CKD, HTN, HL, PAD, carotid stenosis -- on at home oxygen 4 L/m.  who is being seen today for the evaluation of CHF at the request of Dr. Grandville Silos.Marland Kitchen  History of Present Illness:   Mr. Gerke was admitted to [facility] 6/13- 05/04/17 for CHF exacerbation. BNP > 6,000. Echo showed EF 35%- akinesis of inferior and posterior wall, mild to mod MR. Pulmonary HTN with est RV systolic pressure of 44 mm Hg. Treated with IV lasix with improvement and discharged following day on preadmission regimen.  He had another admission towards the end of June related to a fall. His last follow-up in the office was with Dr. Martinique on July 3, he had not been having any shortness of breath, palpitations or dizziness. He had a 22 lb weight loss since his previous visit. At the June clinic visit his weight was 157 lbs.   He presented to the ER on 07/16/17 for evaluation multiple falls-- typically ambulated with a walker. He reports having his lasix doubled and metolazone added last week by cardiology and feels that it helped diureses him..Denies chest pains, dyspnea, orthopnea. ER COURSE: EKG shows sinus rhythm with first degree AV block. Chest xray with cardiomegaly. CT head- no acute changes, NA 126, BUN 45, sCr 2.37 which is elevated from 0.87 in June of this year. Hemoglobin is 7.9, down from 10.1 this past June. BNP is 1,436. Neg hemoccult. Admitted by the  medicine team.  Patient denies having falls or being here for falls. Reports to me that he feels pretty good. He says what brought him to the hospital was the swelling around his ankles. No shocks, dyspnea, fall or chest pain.  Past Medical History:  Diagnosis Date  . Anemia   . Arthritis   . Automatic implantable cardioverter-defibrillator in situ   . Cardiomyopathy, ischemic    a. s/p ICD placement  . Chronic combined systolic and diastolic CHF (congestive heart failure) ([place])   . Chronic lower back pain   . CKD (chronic kidney disease), stage III   . Coronary artery disease    a. s/p CABG 1990. b. multiple PCIs since that time including CTO angioplasty (without stenting) PCI of RCA 09/2014, with last cath 02/2015 done for worsening CHF with unsuccessful attempt at PCI of the RCA due to inability to cross occlusion - medical therapy advised at that time.  Marland Kitchen GERD (gastroesophageal reflux disease)   . HLD (hyperlipidemia)   . Hypertension   . Occlusion and stenosis of carotid artery without mention of cerebral infarction   . Orthostatic hypotension   . Paroxysmal ventricular tachycardia ([place])   . Peripheral vascular disease ([place])   . Raynaud's syndrome   . Stroke [facility])    a. CT head 06/2015: Small old lacunar infarct LEFT basal ganglia.  . Thrombocytopenia ([place])   . TIA (transient ischemic attack)    a. s/p LHC on 10/01/14   . Type II  diabetes mellitus ([place])     Past Surgical History:  Procedure Laterality Date  . ANAL FISTULECTOMY  2000's  . CARDIAC CATHETERIZATION  07/2014  . CARDIAC CATHETERIZATION  10/01/2014   Procedure: CORONARY BALLOON ANGIOPLASTY;  Surgeon: Peter M Martinique, MD;  Location: [facility] CATH LAB;  Service: Cardiovascular;;  . CARDIAC DEFIBRILLATOR PLACEMENT  02/1989-12/2011   "I've had a total of 6 or 7 defibrillators put in" (09/30/2014)  . CATARACT EXTRACTION, BILATERAL Bilateral ~ 2009  . CORONARY ANGIOPLASTY  10/01/14   PTVA to CTO with restoration of TIMI 3  .  CORONARY ANGIOPLASTY WITH STENT PLACEMENT  04/2013   "1"  . CORONARY ARTERY BYPASS GRAFT  01/1989   "CABG X3"   . FINGER FRACTURE SURGERY Left ~ 2000   "pointer"  . FRACTURE SURGERY    . IMPLANTABLE CARDIOVERTER DEFIBRILLATOR (ICD) GENERATOR CHANGE Left 10/05/2011   Procedure: ICD GENERATOR CHANGE;  Surgeon: Evans Lance, MD;  Location: [facility] CATH LAB;  Service: Cardiovascular;  Laterality: Left;  . LEFT AND RIGHT HEART CATHETERIZATION WITH CORONARY/GRAFT ANGIOGRAM N/A 04/26/2013   Procedure: LEFT AND RIGHT HEART CATHETERIZATION WITH Beatrix Fetters;  Surgeon: Sherren Mocha, MD;  Location[facility] C[facility] LAB;  Service: Cardiovascular;  Laterality: N/A;  . LEFT HEART CATHETERIZATION WITH CORONARY ANGIOGRAM N/A 08/01/2014   Procedure: LEFT HEART CATHETERIZATION WITH CORONARY ANGIOGRAM;  Surgeon: Sinclair Grooms, MD;  Location: [facility] CATH LAB;  Service: Cardiovascular;  Laterality: N/A;  . LEFT HEART CATHETERIZATION WITH CORONARY ANGIOGRAM N/A 03/20/2015   Procedure: LEFT HEART CATHETERIZATION WITH CORONARY ANGIOGRAM;  Surgeon: Peter M Martinique, MD;  Location: [facility] CATH LAB;  Service: Cardiovascular;  Laterality: N/A;  . PERCUTANEOUS STENT INTERVENTION  04/26/2013   Procedure: PERCUTANEOUS STENT INTERVENTION;  Surgeon: Sherren Mocha, MD;  Location: [facility] CATH LAB;  Service: Cardiovascular;;  . RIGHT HEART CATHETERIZATION  10/01/2014   Procedure: RIGHT HEART CATH;  Surgeon: Peter M Martinique, MD;  Location: [facility] CATH LAB;  Service: Cardiovascular;;     Inpatient Medications: Scheduled Meds: . aspirin EC  81 mg Oral Daily  . bisoprolol  2.5 mg Oral Daily  . clopidogrel  75 mg Oral Daily  . DULoxetine  60 mg Oral Daily  . feeding supplement (ENSURE ENLIVE)  237 mL Oral BID BM  . insulin aspart  0-5 Units Subcutaneous QHS  . insulin aspart  0-9 Units Subcutaneous TID WC  . insulin detemir  20 Units Subcutaneous Daily  . pravastatin  40 mg Oral q1800  . pregabalin  150 mg Oral Daily  . sodium chloride flush   3 mL Intravenous Q12H  . traZODone  50 mg Oral QHS  . vitamin B-12  1,000 mcg Oral Daily   Continuous Infusions: . sodium chloride 50 mL/hr at 07/17/17 1323   PRN Meds: acetaminophen **OR** acetaminophen, bisacodyl, nitroGLYCERIN, ondansetron **OR** ondansetron (ZOFRAN) IV, senna-docusate  Allergies:    Allergies  Allergen Reactions  . Coreg [Carvedilol] Other (See Comments)    Shortness of breath ,fatigue ,dizzyness   . Lovastatin Other (See Comments)    CK elevation, Myalgias  . Ativan [Lorazepam] Other (See Comments)    Pt. had side effects    Social History:   Social History   Social History  . Marital status: Married    Spouse name: N/A  . Number of children: N/A  . Years of education: N/A   Occupational History  . Not on file.   Social History Main Topics  . Smoking status: Former Smoker  Years: 3.00    Types: Pipe  . Smokeless tobacco: Never Used     Comment: "quit smoking pipe in early 1970's"  . Alcohol use Yes     Comment: 09/30/2014 "wine w/dinner maybe once/yr"  . Drug use: No  . Sexual activity: No   Other Topics Concern  . 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     Family History:   Family History  Problem Relation Age of Onset  . Heart failure Mother 11  . Heart attack Father   . Leukemia Sister   . Stomach cancer Maternal Grandfather      ROS:  Please see the history of present illness.  ROS  All other ROS reviewed and negative.     Physical Exam/Data:   Vitals:   07/17/17 0500 07/17/17 0800 07/17/17 1339 07/17/17 1409  BP: 137/69 (!) 117/54 103/64 (!) 115/43  Pulse:   68 63  Resp:    18  Temp: 97.6 F (36.4 C) 98.1 F (36.7 C) 98 F (36.7 C) 98.2 F (36.8 C)  TempSrc: Oral Oral Oral Oral  SpO2: 100% 100%  100%  Weight:      Height:        Intake/Output Summary  (Last 24 hours) at 07/17/17 1548 Last data filed at 07/17/17 1500  Gross per 24 hour  Intake           470.83 ml  Output             1300 ml  Net          -829.17 ml   Filed Weights   07/16/17 2010 07/17/17 0042  Weight: 165 lb (74.8 kg) 164 lb 4.8 oz (74.5 kg)   Body mass index is 25.73 kg/m.  General:  Well nourished, well developed, in no acute distress  HEENT: normal Lymph: no adenopathy Neck: no JVD Endocrine:  No thryomegaly Vascular: No carotid bruits; FA pulses 2+ bilaterally without bruits  Cardiac:  normal S1, S2; RRR; + systolic murmur Lungs:  clear to auscultation bilaterally, no wheezing, rhonchi or rales  Abd: soft, nontender, no hepatomegaly  Ext: + le edema, 2+ pitting Musculoskeletal:  No deformities, BUE and BLE strength normal and equal Skin: warm and dry  Neuro:  CNs 2-12 intact, no focal abnormalities noted Psych:  Normal affect   EKG:  The EKG was personally reviewed and demonstrates: II degree AV block.   Telemetry:  Telemetry was personally reviewed and demonstrates:  Paced rhythm  II degree AV block.  Relevant CV Studies:  Echocardiogram 06/2015 Study Conclusions  - Left ventricle: The cavity size was normal. Wall thickness was   increased in a pattern of mild LVH. Systolic function was   moderately reduced. The estimated ejection fraction was in the   range of 35% to 40%. There is hypokinesis of the inferior   myocardium. There was no evidence of elevated ventricular filling   pressure by Doppler parameters. - Mitral valve: There was mild regurgitation. - Left atrium: The atrium was mildly dilated. - Pulmonary arteries: Systolic pressure was mildly increased. PA   peak pressure: 42 mm Hg (S).  Impressions:  - Compared to the prior study, there has been no significant   interval change.  Laboratory Data:  Chemistry  Recent Labs Lab 07/16/17 2041 07/17/17 0040  NA 126* 130*  K 4.9 4.6  CL 87* 90*  CO2 28 32  GLUCOSE 102* 106*   BUN 45* 46*  CREATININE 2.37* 2.32*  CALCIUM 8.3* 8.4*  GFRNONAA 24* 25*  GFRAA 28* 29*  ANIONGAP 11 8    Hematology  Recent Labs Lab 07/16/17 2041 07/17/17 0040  WBC 8.4 7.1  RBC 3.32* 3.31*  3.33*  HGB 7.9* 7.8*  HCT 25.6* 25.5*  MCV 77.1* 77.0*  MCH 23.8* 23.6*  MCHC 30.9 30.6  RDW 15.7* 15.7*  PLT 135* 152    Recent Labs Lab 07/16/17 2045  TROPIPOC 0.01    BNP  Recent Labs Lab 07/16/17 2041  BNP 1,436.0*    Radiology/Studies:  Dg Chest 2 View  Result Date: 07/16/2017 CLINICAL DATA:  Multiple recent falls.  Pitting edema. EXAM: CHEST  2 VIEW COMPARISON:  Chest radiograph May 03, 2017 FINDINGS: Cardiac silhouette is mildly enlarged. Mediastinal silhouette is nonsuspicious. Calcified aortic arch. Status post CABG. Single lead LEFT cardiac defibrillator in situ. Low lung volumes with crowded vascular markings. Bibasilar strandy densities. Persistently elevated RIGHT hemidiaphragm. No pleural effusion or focal consolidation. Soft tissue planes and included osseous structures are nonsuspicious. Old LEFT rib fractures. IMPRESSION: Mild cardiomegaly.  Bibasilar atelectasis. Electronically Signed   By: Elon Alas M.D.   On: 07/16/2017 21:14   Ct Head Wo Contrast  Result Date: 07/16/2017 CLINICAL DATA:  Multiple falls over the past 2 days when using his walker. EXAM: CT HEAD WITHOUT CONTRAST TECHNIQUE: Contiguous axial images were obtained from the base of the skull through the vertex without intravenous contrast. COMPARISON:  Head CT 06/28/2015 FINDINGS: Brain: Generalized atrophy, normal for age. Mild chronic small vessel ischemia. Remote lacunar infarct in the left basal ganglia, unchanged. No intracranial hemorrhage, mass effect, or midline shift. No hydrocephalus. The basilar cisterns are patent. No evidence of territorial infarct or acute ischemia. No extra-axial or intracranial fluid collection. Vascular: Atherosclerosis of skullbase vasculature without  hyperdense vessel or abnormal calcification. Skull: No skull fracture or focal lesion. Sinuses/Orbits: Paranasal sinuses and mastoid air cells are clear. The visualized orbits are unremarkable. Bilateral cataract resection. Other: None. IMPRESSION: 1. No acute intracranial abnormality. 2. Stable atrophy and chronic small vessel ischemia. Remote lacunar infarct in left basal ganglia. Electronically Signed   By: Jeb Levering M.D.   On: 07/16/2017 22:05   US Renal  Result Date: 07/17/2017 CLINICAL DATA:  Acute kidney injury. EXAM: RENAL / URINARY TRACT ULTRASOUND COMPLETE COMPARISON:  None. FINDINGS: Right Kidney: Length: 10.2 cm. Echogenicity within normal limits. No mass or hydronephrosis visualized. Left Kidney: Length: 10.4 cm. Echogenicity within normal limits. No mass or hydronephrosis visualized. Bladder: Appears normal for degree of bladder distention. Other: Enlarged spleen measuring 14.2 cm in length, 13.2 cm in width and 8.0 cm in AP diameter with a calculated volume of 750 cc. IMPRESSION: 1. Normal appearing kidneys with no hydronephrosis. 2. Splenomegaly. Electronically Signed   By: Claudie Revering M.D.   On: 07/17/2017 11:45    Assessment and Plan:   Chronic combined systolic and diastolic CHF: On at home oxygen 4 L/m.  - down approximately 1 liter so far this admission. - Weight is 164, est dry weight is 157. - Not receiving any diuretic, ACE/ARB due to nephrotoxicity.  --He needs diuresis, will order diuretics. Home dose is 40 mg BID lasix. Will order 1 dose of 40 mg IV and assess creatinine in the morning.  Mitral Regurg: Recheck Cardiac Echo to assess   CAD  s/p CABG: Pt has not been having any chest pains.  No troponins checked this admission and no ischemic changes on EKG. -- continue aspirin and Plavix.    Ischemic cardiomyopathy with hx of VT in 2012 s/p ICD:  -- continue Bisoprolol  Acute on CKD:  GFR 29 and creatinine is 2.32 (sCr was ~0.8 a few months ago)  CVA:  TIA  s/p LHC in 2015  HTN: Well controlled.  Diabetes: Medicine to manage.  HL:  69, continue current therapy of Pravastatin  Carotid Stenosis: No documentation of imaging      Signed, Linus Mako, PA-C  07/17/2017    I have seen and examined the patient along with GREENE,TIFFANY G, PA-C.  I have reviewed the chart, notes and new data.  I agree with PA's note.  Key new complaints: no dyspnea at rest, he is almost flat in bed. Denies angina Key examination changes: JVP distended to the angle of the jaw, + ve gallop (S3?), 3/6 holosystolic apical murmur, very few basal rales, no leg edema. Ecchymoses anterior chest. Key new findings / data: BNP 1436, well over 380-400 baseline, creat 2.3 is much higher than baseline 1.3. A1c 9.3% deteriorating.   PLAN: Overall exam suggests volume overload and significant MR (ischemic MR due to previous inferior MI?).Marland Kitchen Recheck echo to assess MR and filling pressures. Give diuretics, but hold ARB and any other nephrotoxics. Recheck labs in AM. Doubt acute coronary insufficiency as a cause of current presentation.  Sanda Klein, MD, [place] [phone] 07/17/2017, 4:01 PM

## 2017-07-18 ENCOUNTER — Inpatient Hospital Stay (HOSPITAL_COMMUNITY): Payer: Medicare Other

## 2017-07-18 DIAGNOSIS — I5043 Acute on chronic combined systolic (congestive) and diastolic (congestive) heart failure: Secondary | ICD-10-CM

## 2017-07-18 DIAGNOSIS — Z95810 Presence of automatic (implantable) cardiac defibrillator: Secondary | ICD-10-CM

## 2017-07-18 DIAGNOSIS — E876 Hypokalemia: Secondary | ICD-10-CM

## 2017-07-18 DIAGNOSIS — I361 Nonrheumatic tricuspid (valve) insufficiency: Secondary | ICD-10-CM

## 2017-07-18 DIAGNOSIS — N183 Chronic kidney disease, stage 3 (moderate): Secondary | ICD-10-CM

## 2017-07-18 DIAGNOSIS — J438 Other emphysema: Secondary | ICD-10-CM

## 2017-07-18 DIAGNOSIS — D500 Iron deficiency anemia secondary to blood loss (chronic): Secondary | ICD-10-CM

## 2017-07-18 DIAGNOSIS — I340 Nonrheumatic mitral (valve) insufficiency: Secondary | ICD-10-CM

## 2017-07-18 DIAGNOSIS — I25810 Atherosclerosis of coronary artery bypass graft(s) without angina pectoris: Secondary | ICD-10-CM

## 2017-07-18 DIAGNOSIS — I2721 Secondary pulmonary arterial hypertension: Secondary | ICD-10-CM

## 2017-07-18 LAB — BASIC METABOLIC PANEL
Anion gap: 9 (ref 5–15)
BUN: 43 mg/dL — AB (ref 6–20)
CALCIUM: 8.4 mg/dL — AB (ref 8.9–10.3)
CO2: 34 mmol/L — ABNORMAL HIGH (ref 22–32)
CREATININE: 1.55 mg/dL — AB (ref 0.61–1.24)
Chloride: 94 mmol/L — ABNORMAL LOW (ref 101–111)
GFR, EST AFRICAN AMERICAN: 47 mL/min — AB (ref >60)
GFR, EST NON AFRICAN AMERICAN: 41 mL/min — AB (ref >60)
Glucose, Bld: 139 mg/dL — ABNORMAL HIGH (ref 65–99)
Potassium: 3.2 mmol/L — ABNORMAL LOW (ref 3.5–5.1)
SODIUM: 137 mmol/L (ref 135–145)

## 2017-07-18 LAB — GLUCOSE, CAPILLARY
GLUCOSE-CAPILLARY: 167 mg/dL — AB (ref 65–99)
GLUCOSE-CAPILLARY: 171 mg/dL — AB (ref 65–99)
Glucose-Capillary: 239 mg/dL — ABNORMAL HIGH (ref 65–99)
Glucose-Capillary: 282 mg/dL — ABNORMAL HIGH (ref 65–99)

## 2017-07-18 LAB — ECHOCARDIOGRAM COMPLETE
CHL CUP DOP CALC LVOT VTI: 24 cm
CHL CUP MV DEC (S): 232
CHL CUP RV SYS PRESS: 49 mmHg
CHL CUP TV REG PEAK VELOCITY: 314 cm/s
E/e' ratio: 8.28
EWDT: 232 ms
FS: 11 % — AB (ref 28–44)
Height: 67 in
IVS/LV PW RATIO, ED: 0.69
LA ID, A-P, ES: 54 mm
LA diam end sys: 54 mm
LA vol: 71.7 mL
LADIAMINDEX: 2.9 cm/m2
LAVOLA4C: 66.9 mL
LAVOLIN: 38.5 mL/m2
LV E/e' medial: 8.28
LV E/e'average: 8.28
LV TDI E'MEDIAL: 8.33
LVELAT: 12.2 cm/s
LVOT peak grad rest: 3 mmHg
LVOT peak vel: 90.8 cm/s
MV pk A vel: 75.5 m/s
MV pk E vel: 101 m/s
MVPG: 4 mmHg
PW: 13 mm — AB (ref 0.6–1.1)
TDI e' lateral: 12.2
TR max vel: 314 cm/s
WEIGHTICAEL: 2643.2 [oz_av]

## 2017-07-18 LAB — CBC
HCT: 24.2 % — ABNORMAL LOW (ref 39.0–52.0)
Hemoglobin: 7.4 g/dL — ABNORMAL LOW (ref 13.0–17.0)
MCH: 23.6 pg — ABNORMAL LOW (ref 26.0–34.0)
MCHC: 30.6 g/dL (ref 30.0–36.0)
MCV: 77.3 fL — AB (ref 78.0–100.0)
PLATELETS: 122 10*3/uL — AB (ref 150–400)
RBC: 3.13 MIL/uL — AB (ref 4.22–5.81)
RDW: 15.3 % (ref 11.5–15.5)
WBC: 5.2 10*3/uL (ref 4.0–10.5)

## 2017-07-18 LAB — PREPARE RBC (CROSSMATCH)

## 2017-07-18 LAB — HEMOGLOBIN AND HEMATOCRIT, BLOOD
HEMATOCRIT: 27.9 % — AB (ref 39.0–52.0)
HEMOGLOBIN: 8.4 g/dL — AB (ref 13.0–17.0)

## 2017-07-18 LAB — MAGNESIUM: MAGNESIUM: 2 mg/dL (ref 1.7–2.4)

## 2017-07-18 LAB — UREA NITROGEN, URINE: Urea Nitrogen, Ur: 208 mg/dL

## 2017-07-18 MED ORDER — FUROSEMIDE 10 MG/ML IJ SOLN
20.0000 mg | Freq: Once | INTRAMUSCULAR | Status: AC
  Administered 2017-07-18: 20 mg via INTRAVENOUS
  Filled 2017-07-18: qty 2

## 2017-07-18 MED ORDER — ACETAMINOPHEN 325 MG PO TABS
650.0000 mg | ORAL_TABLET | Freq: Once | ORAL | Status: AC
  Administered 2017-07-18: 650 mg via ORAL
  Filled 2017-07-18: qty 2

## 2017-07-18 MED ORDER — SODIUM CHLORIDE 0.9 % IV SOLN
510.0000 mg | Freq: Once | INTRAVENOUS | Status: AC
  Administered 2017-07-19: 510 mg via INTRAVENOUS
  Filled 2017-07-18 (×2): qty 17

## 2017-07-18 MED ORDER — SODIUM CHLORIDE 0.9 % IV SOLN
Freq: Once | INTRAVENOUS | Status: AC
  Administered 2017-07-18: 12:00:00 via INTRAVENOUS

## 2017-07-18 MED ORDER — MAGNESIUM SULFATE IN D5W 1-5 GM/100ML-% IV SOLN
1.0000 g | Freq: Once | INTRAVENOUS | Status: AC
  Administered 2017-07-18: 1 g via INTRAVENOUS
  Filled 2017-07-18: qty 100

## 2017-07-18 MED ORDER — POTASSIUM CHLORIDE CRYS ER 20 MEQ PO TBCR
40.0000 meq | EXTENDED_RELEASE_TABLET | Freq: Every day | ORAL | Status: DC
  Administered 2017-07-18 – 2017-07-20 (×3): 40 meq via ORAL
  Filled 2017-07-18 (×3): qty 2

## 2017-07-18 MED ORDER — DIPHENHYDRAMINE HCL 25 MG PO CAPS
25.0000 mg | ORAL_CAPSULE | Freq: Once | ORAL | Status: AC
  Administered 2017-07-18: 25 mg via ORAL
  Filled 2017-07-18: qty 1

## 2017-07-18 NOTE — Progress Notes (Signed)
Nutrition Follow-up  DOCUMENTATION CODES:   Not applicable  INTERVENTION:   -Continue Ensure Enlive po BID, each supplement provides 350 kcal and 20 grams of protein   NUTRITION DIAGNOSIS:   Increased nutrient needs related to acute illness as evidenced by estimated needs.  GOAL:   Patient will meet greater than or equal to 90% of their needs  MONITOR:   PO intake, Supplement acceptance, Labs, Weight trends  REASON FOR ASSESSMENT:   Malnutrition Screening Tool    ASSESSMENT:   81 yo male admitted with AKI, acute on chronic CHF. Pt with CAD/CABG, CHF, DM, HTN, chronic pain  Recorded po intake 75-100% of meals. Pt drinking Ensure Enlive supplement as well Pt reports good appetite at home PTA  Pt reports he does not know if he has lost any weight recently. Per weight encounters, pt with 7.8% wt loss in 6 months which is not significant for time frame. Current wt of 165 pounds; pt reports UBW is 156-157 pounds.   Pt declined physical exam at this time  Labs: potassium 3.2, Creatinine 1.55 Meds: lasix, ss novolog, levemir, KCl, B12  Diet Order:  Diet heart healthy/carb modified Room service appropriate? Yes; Fluid consistency: Thin  Skin:  Reviewed, no issues  Last BM:  8/27  Height:   Ht Readings from Last 1 Encounters:  07/17/17 5\' 7"  (1.702 m)    Weight:   Wt Readings from Last 1 Encounters:  07/18/17 165 lb 3.2 oz (74.9 kg)    Ideal Body Weight:     BMI:  Body mass index is 25.87 kg/m.  Estimated Nutritional Needs:   Kcal:  7829-5621 kcals  Protein:  88-98 g   Fluid:  >/= 1.8 L  EDUCATION NEEDS:   No education needs identified at this time  [place], [place], LDN [phone] Pager  [phone] Weekend/On-Call Pager

## 2017-07-18 NOTE — Progress Notes (Signed)
PROGRESS NOTE    Victor [name]  QVZ:[id number] DOB: Jul 06, 1936 DOA: 07/16/2017 PCP: Cyndi Bender, PA-C   Brief Narrative:  Victor [name] is a 81 y.o. male with medical history significant for coronary artery disease status post CABG, chronic systolic CHF with ICD in place, insulin dependent diabetes mellitus, hypertension, chronic pain, and anemia, and into the emergency department for evaluation of recurrent falls. Patient typically ambulates with a walker, but over the past couple days has fallen multiple times due to his legs "giving out." He was hospitalized for acute CHF in June, has been following with cardiology, and had his Lasix doubled and metolazone added approximately one week ago. He reports good improvement in his edema with the medication change, and had been pleased with the results, but over the past couple days has experienced recurring episodes of his legs "giving out." He denies headache, change in vision or hearing, or focal numbness or weakness. No fevers or chills. Denies orthopnea, dyspnea, or cough.  Patient admitted and noted to be volume overloaded on examination. Patient placed on IV Lasix with good urine output. Patient's renal function also improved. Cardiology was consulted due to patient's complex cardiac history.   Assessment & Plan:   Principal Problem:   AKI (acute kidney injury) ([place]) Active Problems:   Insulin dependent type 2 diabetes mellitus, controlled (HCC)   Chronic clinical systolic heart failure (HCC)   Microcytic anemia   Essential hypertension   Hx of CABG 1990   OSA (obstructive sleep apnea)   Emphysema of lung (HCC)   Hyponatremia   Chest pain   Acute on chronic combined systolic and diastolic CHF (congestive heart failure) (HCC)   Ischemic cardiomyopathy  #1 acute kidney injury Likely secondary to acute on chronic combined systolic and diastolic heart failure. Patient noted to be significantly volume overloaded on examination.  Creatinine was 0.87 in June 2018. Creatinine on admission was up to 2.37. Urine studies pending. Renal ultrasound negative for hydronephrosis. ACE inhibitor and ARB on hold. Patient placed on IV diuretics with good urine output. Renal function trending down. Continue diuresis. Avoid nephrotoxins. Cardiology following and appreciate input and recommendations.   #2 acute on chronic combined systolic and diastolic heart failure Patient noted to be volume overloaded on examination with lower extremity edema and diffuse crackles on clinical examination. Saline lock IV fluids. 2-D echo with a EF of 35-40% with akinesis of basal midinferior myocardium. Mild mitral valvular regurgitation. Moderate tricuspid valvular regurgitation. Patient placed on IV Lasix with a urine output of 1.650 L. Patient is -2.167 data during the hospitalization.  Continues cardiac regimen zebeta, aspirin, statin and Plavix and IV Lasix. Cardiology following and appreciate input and recommendations.   #3 hypertension Continue current regimen of zebeta.  #4 insulin-dependent diabetes mellitus type 2 Check a hemoglobin A1c. CBGs have ranged from 171-239. Continue Levemir and sliding scale insulin.  #5 mitral regurgitation/tricuspid regurgitation 2-D echo with a EF of 35-40% with akinesis of basal midinferior myocardium. Mild mitral valvular regurgitation. Left atrium severely dilated. Moderate tricuspid valvular regurgitation. No significant change from prior 2-D echo. Per cardiology. has been ordered per cardiology.  #6 history of ischemic cardiomyopathy with history of V. tach in 2012 status post ICD Continue beta blocker. Keep magnesium greater than 2. Keep potassium greater than 4. Per cardiology.  #7 hyperlipidemia Continue statin.  #8 hyponatremia Likely secondary to hypervolemic hyponatremia. Patient is volume overloaded on examination. Improved with diuretics. Continue IV Lasix.   #9 coronary artery disease status  post  CABG No current chest pain.  Continue cardiac regimen of aspirin, statin, Plavix,zebeta. On IV Lasix.  #10 microcytic anemia  Patient with a hemoglobin down to 7.4. FOBT negative. No overt bleeding. Due to patient's cardiac history will transfuse 1 unit packed red blood cells. Follow H&H. Monitor for volume overload. Will give a dose of IV Feraheme. May benefit from oral iron supplementation on discharge.   DVT prophylaxis: SCDs Code Status: Full Family Communication: Updated patient. No family at bedside. Disposition Plan: Home once renal function has improved and close to baseline and euvolemic from CHF exacerbation.   Consultants:   Cardiology: Dr.Croitoru 07/17/2017  Procedures:   CT head 07/16/2017  Chest x-ray 26 2018  Renal ultrasound 07/17/2017  1 unit packed red blood cells 07/18/2017\  2-D echo 07/18/2017  Antimicrobials:   None   Subjective: Patient laying in bed. Denies shortness of breath. No chest and. No nausea or vomiting. Per cardiology patient with frequent episodes of second-degree Mobitz type I AV block with occasional PVCs.   Objective: Vitals:   07/18/17 1213 07/18/17 1242 07/18/17 1505 07/18/17 1645  BP: (!) 141/45 136/81 137/64 (!) 123/56  Pulse: 64 66 70 68  Resp: 18 18 20 18   Temp: 97.7 F (36.5 C) (!) 97.5 F (36.4 C) 98.6 F (37 C) 98.2 F (36.8 C)  TempSrc: Oral Axillary Oral Oral  SpO2: 100% 96% 98% 99%  Weight:      Height:        Intake/Output Summary (Last 24 hours) at 07/18/17 1836 Last data filed at 07/18/17 1748  Gross per 24 hour  Intake             1512 ml  Output             2500 ml  Net             -988 ml   Filed Weights   07/16/17 2010 07/17/17 0042 07/18/17 0629  Weight: 74.8 kg (165 lb) 74.5 kg (164 lb 4.8 oz) 74.9 kg (165 lb 3.2 oz)    Examination:  General exam: Appears calm and comfortable  Respiratory system: Diffuse crackles. No wheezing.  Respiratory effort normal. Cardiovascular system:  Irregularly irregular with 3/6 SEM. + JVD, murmurs, rubs, gallops or clicks. Trace-1+ bilateral lower extremity edema. Gastrointestinal system: Abdomen is nondistended, soft and nontender. No organomegaly or masses felt. Normal bowel sounds heard. Central nervous system: Alert and oriented. No focal neurological deficits. Extremities: Symmetric 5 x 5 power. Skin: No rashes, lesions or ulcers Psychiatry: Judgement and insight appear normal. Mood & affect appropriate.     Data Reviewed: I have personally reviewed following labs and imaging studies  CBC:  Recent Labs Lab 07/16/17 2041 07/17/17 0040 07/18/17 0503 07/18/17 1649  WBC 8.4 7.1 5.2  --   HGB 7.9* 7.8* 7.4* 8.4*  HCT 25.6* 25.5* 24.2* 27.9*  MCV 77.1* 77.0* 77.3*  --   PLT 135* 152 122*  --    Basic Metabolic Panel:  Recent Labs Lab 07/16/17 2041 07/17/17 0040 07/18/17 0503  NA 126* 130* 137  K 4.9 4.6 3.2*  CL 87* 90* 94*  CO2 28 32 34*  GLUCOSE 102* 106* 139*  BUN 45* 46* 43*  CREATININE 2.37* 2.32* 1.55*  CALCIUM 8.3* 8.4* 8.4*  MG  --   --  2.0   GFR: Estimated Creatinine Clearance: 36.1 mL/min (A) (by C-G formula based on SCr of 1.55 mg/dL (H)). Liver Function Tests: No results for input(s):  AST, ALT, ALKPHOS, BILITOT, PROT, ALBUMIN in the last 168 hours. No results for input(s): LIPASE, AMYLASE in the last 168 hours. No results for input(s): AMMONIA in the last 168 hours. Coagulation Profile: No results for input(s): INR, PROTIME in the last 168 hours. Cardiac Enzymes: No results for input(s): CKTOTAL, CKMB, CKMBINDEX, TROPONINI in the last 168 hours. BNP (last 3 results) No results for input(s): PROBNP in the last 8760 hours. HbA1C: No results for input(s): HGBA1C in the last 72 hours. CBG:  Recent Labs Lab 07/17/17 1611 07/17/17 2131 07/18/17 0927 07/18/17 1156 07/18/17 1635  GLUCAP 183* 113* 167* 239* 171*   Lipid Profile: No results for input(s): CHOL, HDL, LDLCALC, TRIG, CHOLHDL,  LDLDIRECT in the last 72 hours. Thyroid Function Tests: No results for input(s): TSH, T4TOTAL, FREET4, T3FREE, THYROIDAB in the last 72 hours. Anemia Panel:  Recent Labs  07/17/17 0040  VITAMINB12 1,479*  FOLATE 9.8  FERRITIN 53  TIBC 322  IRON 16*  RETICCTPCT 1.6   Sepsis Labs: No results for input(s): PROCALCITON, LATICACIDVEN in the last 168 hours.  No results found for this or any previous visit (from the past 240 hour(s)).       Radiology Studies: Dg Chest 2 View  Result Date: 07/16/2017 CLINICAL DATA:  Multiple recent falls.  Pitting edema. EXAM: CHEST  2 VIEW COMPARISON:  Chest radiograph May 03, 2017 FINDINGS: Cardiac silhouette is mildly enlarged. Mediastinal silhouette is nonsuspicious. Calcified aortic arch. Status post CABG. Single lead LEFT cardiac defibrillator in situ. Low lung volumes with crowded vascular markings. Bibasilar strandy densities. Persistently elevated RIGHT hemidiaphragm. No pleural effusion or focal consolidation. Soft tissue planes and included osseous structures are nonsuspicious. Old LEFT rib fractures. IMPRESSION: Mild cardiomegaly.  Bibasilar atelectasis. Electronically Signed   By: Elon Alas M.D.   On: 07/16/2017 21:14   Ct Head Wo Contrast  Result Date: 07/16/2017 CLINICAL DATA:  Multiple falls over the past 2 days when using his walker. EXAM: CT HEAD WITHOUT CONTRAST TECHNIQUE: Contiguous axial images were obtained from the base of the skull through the vertex without intravenous contrast. COMPARISON:  Head CT 06/28/2015 FINDINGS: Brain: Generalized atrophy, normal for age. Mild chronic small vessel ischemia. Remote lacunar infarct in the left basal ganglia, unchanged. No intracranial hemorrhage, mass effect, or midline shift. No hydrocephalus. The basilar cisterns are patent. No evidence of territorial infarct or acute ischemia. No extra-axial or intracranial fluid collection. Vascular: Atherosclerosis of skullbase vasculature without  hyperdense vessel or abnormal calcification. Skull: No skull fracture or focal lesion. Sinuses/Orbits: Paranasal sinuses and mastoid air cells are clear. The visualized orbits are unremarkable. Bilateral cataract resection. Other: None. IMPRESSION: 1. No acute intracranial abnormality. 2. Stable atrophy and chronic small vessel ischemia. Remote lacunar infarct in left basal ganglia. Electronically Signed   By: Jeb Levering M.D.   On: 07/16/2017 22:05   US Renal  Result Date: 07/17/2017 CLINICAL DATA:  Acute kidney injury. EXAM: RENAL / URINARY TRACT ULTRASOUND COMPLETE COMPARISON:  None. FINDINGS: Right Kidney: Length: 10.2 cm. Echogenicity within normal limits. No mass or hydronephrosis visualized. Left Kidney: Length: 10.4 cm. Echogenicity within normal limits. No mass or hydronephrosis visualized. Bladder: Appears normal for degree of bladder distention. Other: Enlarged spleen measuring 14.2 cm in length, 13.2 cm in width and 8.0 cm in AP diameter with a calculated volume of 750 cc. IMPRESSION: 1. Normal appearing kidneys with no hydronephrosis. 2. Splenomegaly. Electronically Signed   By: Claudie Revering M.D.   On: 07/17/2017 11:45  Scheduled Meds: . aspirin EC  81 mg Oral Daily  . bisoprolol  2.5 mg Oral Daily  . DULoxetine  60 mg Oral Daily  . feeding supplement (ENSURE ENLIVE)  237 mL Oral BID BM  . furosemide  40 mg Intravenous Q12H  . insulin aspart  0-5 Units Subcutaneous QHS  . insulin aspart  0-9 Units Subcutaneous TID WC  . insulin detemir  20 Units Subcutaneous Daily  . potassium chloride  40 mEq Oral Daily  . pravastatin  40 mg Oral q1800  . pregabalin  150 mg Oral Daily  . sodium chloride flush  3 mL Intravenous Q12H  . traZODone  50 mg Oral QHS  . vitamin B-12  1,000 mcg Oral Daily   Continuous Infusions:    LOS: 2 days    Time spent: 47 minutes    Chaela Branscum, MD [facility] Pager [phone]  If 7PM-7AM, please contact  night-coverage [email or web address] Password [facility] 07/18/2017, 6:36 PM

## 2017-07-18 NOTE — Progress Notes (Signed)
Pt. Refused cpap. Pt. Encouraged to notify if he changes his mind.

## 2017-07-18 NOTE — Progress Notes (Signed)
  Echocardiogram 2D Echocardiogram has been performed.  Johny Chess 07/18/2017, 9:01 AM

## 2017-07-18 NOTE — Progress Notes (Signed)
Progress Note  Patient Name: Victor [name] Date of Encounter: 07/18/2017  Primary Cardiologist: [place]  Subjective   Improved with diuretics, net negative urine output 1.5 L, weight loss corresponding 3 pounds. Substantial improvement in renal parameters in face of diuresis. Preliminary review of echo shows that the mitral regurgitation is still mild, the loud murmurs related to moderate to severe tricuspid regurgitation. Moderate pulmonary hypertension is present. LV EF appears grossly unchanged around 35%.  Inpatient Medications    Scheduled Meds: . aspirin EC  81 mg Oral Daily  . bisoprolol  2.5 mg Oral Daily  . clopidogrel  75 mg Oral Daily  . DULoxetine  60 mg Oral Daily  . feeding supplement (ENSURE ENLIVE)  237 mL Oral BID BM  . furosemide  40 mg Intravenous Q12H  . insulin aspart  0-5 Units Subcutaneous QHS  . insulin aspart  0-9 Units Subcutaneous TID WC  . insulin detemir  20 Units Subcutaneous Daily  . potassium chloride  40 mEq Oral Daily  . pravastatin  40 mg Oral q1800  . pregabalin  150 mg Oral Daily  . sodium chloride flush  3 mL Intravenous Q12H  . traZODone  50 mg Oral QHS  . vitamin B-12  1,000 mcg Oral Daily   Continuous Infusions:  PRN Meds: acetaminophen **OR** acetaminophen, bisacodyl, nitroGLYCERIN, ondansetron **OR** ondansetron (ZOFRAN) IV, senna-docusate   Vital Signs    Vitals:   07/17/17 1957 07/18/17 0004 07/18/17 0629 07/18/17 0902  BP: 129/64 (!) 141/47 (!) 125/45 (!) 130/48  Pulse: 63 (!) 56 (!) 54 (!) 59  Resp: 18 18 18 17   Temp: 97.7 F (36.5 C) 97.6 F (36.4 C) 97.9 F (36.6 C) 97.7 F (36.5 C)  TempSrc: Oral Oral Oral Oral  SpO2: 100% 99% 99% 100%  Weight:   165 lb 3.2 oz (74.9 kg)   Height:        Intake/Output Summary (Last 24 hours) at 07/18/17 1134 Last data filed at 07/18/17 1005  Gross per 24 hour  Intake          1040.83 ml  Output             2300 ml  Net         -1259.17 ml   Filed Weights   07/16/17 2010  07/17/17 0042 07/18/17 0629  Weight: 165 lb (74.8 kg) 164 lb 4.8 oz (74.5 kg) 165 lb 3.2 oz (74.9 kg)    Telemetry    Sinus rhythm with frequent episodes of second-degree Mobitz type I AV block and occasional PVCs. No atrial fibrillation seen - Personally Reviewed  ECG    No new tracing - Personally Reviewed  Physical Exam  Alert, comfortable, relaxed, lying at about 20 head of bed elevation GEN: No acute distress.   Neck:  3-4 cm JVD Cardiac:  irregular rhythm, 3/6 holosystolic murmur heard throughout the entire precordium, no Diastolic murmurs, rubs, or gallops.  Respiratory: Clear to auscultation bilaterally. GI: Soft, nontender, non-distended  MS: No edema; No deformity. Neuro:  Nonfocal  Psych: Normal affect   Labs    Chemistry Recent Labs Lab 07/16/17 2041 07/17/17 0040 07/18/17 0503  NA 126* 130* 137  K 4.9 4.6 3.2*  CL 87* 90* 94*  CO2 28 32 34*  GLUCOSE 102* 106* 139*  BUN 45* 46* 43*  CREATININE 2.37* 2.32* 1.55*  CALCIUM 8.3* 8.4* 8.4*  GFRNONAA 24* 25* 41*  GFRAA 28* 29* 47*  ANIONGAP 11 8 9  Hematology Recent Labs Lab 07/16/17 2041 07/17/17 0040 07/18/17 0503  WBC 8.4 7.1 5.2  RBC 3.32* 3.31*  3.33* 3.13*  HGB 7.9* 7.8* 7.4*  HCT 25.6* 25.5* 24.2*  MCV 77.1* 77.0* 77.3*  MCH 23.8* 23.6* 23.6*  MCHC 30.9 30.6 30.6  RDW 15.7* 15.7* 15.3  PLT 135* 152 122*    Cardiac EnzymesNo results for input(s): TROPONINI in the last 168 hours.  Recent Labs Lab 07/16/17 2045  TROPIPOC 0.01     BNP Recent Labs Lab 07/16/17 2041  BNP 1,436.0*     DDimer No results for input(s): DDIMER in the last 168 hours.   Radiology    Dg Chest 2 View  Result Date: 07/16/2017 CLINICAL DATA:  Multiple recent falls.  Pitting edema. EXAM: CHEST  2 VIEW COMPARISON:  Chest radiograph May 03, 2017 FINDINGS: Cardiac silhouette is mildly enlarged. Mediastinal silhouette is nonsuspicious. Calcified aortic arch. Status post CABG. Single lead LEFT cardiac  defibrillator in situ. Low lung volumes with crowded vascular markings. Bibasilar strandy densities. Persistently elevated RIGHT hemidiaphragm. No pleural effusion or focal consolidation. Soft tissue planes and included osseous structures are nonsuspicious. Old LEFT rib fractures. IMPRESSION: Mild cardiomegaly.  Bibasilar atelectasis. Electronically Signed   By: Elon Alas M.D.   On: 07/16/2017 21:14   Ct Head Wo Contrast  Result Date: 07/16/2017 CLINICAL DATA:  Multiple falls over the past 2 days when using his walker. EXAM: CT HEAD WITHOUT CONTRAST TECHNIQUE: Contiguous axial images were obtained from the base of the skull through the vertex without intravenous contrast. COMPARISON:  Head CT 06/28/2015 FINDINGS: Brain: Generalized atrophy, normal for age. Mild chronic small vessel ischemia. Remote lacunar infarct in the left basal ganglia, unchanged. No intracranial hemorrhage, mass effect, or midline shift. No hydrocephalus. The basilar cisterns are patent. No evidence of territorial infarct or acute ischemia. No extra-axial or intracranial fluid collection. Vascular: Atherosclerosis of skullbase vasculature without hyperdense vessel or abnormal calcification. Skull: No skull fracture or focal lesion. Sinuses/Orbits: Paranasal sinuses and mastoid air cells are clear. The visualized orbits are unremarkable. Bilateral cataract resection. Other: None. IMPRESSION: 1. No acute intracranial abnormality. 2. Stable atrophy and chronic small vessel ischemia. Remote lacunar infarct in left basal ganglia. Electronically Signed   By: Jeb Levering M.D.   On: 07/16/2017 22:05   US Renal  Result Date: 07/17/2017 CLINICAL DATA:  Acute kidney injury. EXAM: RENAL / URINARY TRACT ULTRASOUND COMPLETE COMPARISON:  None. FINDINGS: Right Kidney: Length: 10.2 cm. Echogenicity within normal limits. No mass or hydronephrosis visualized. Left Kidney: Length: 10.4 cm. Echogenicity within normal limits. No mass or  hydronephrosis visualized. Bladder: Appears normal for degree of bladder distention. Other: Enlarged spleen measuring 14.2 cm in length, 13.2 cm in width and 8.0 cm in AP diameter with a calculated volume of 750 cc. IMPRESSION: 1. Normal appearing kidneys with no hydronephrosis. 2. Splenomegaly. Electronically Signed   By: Claudie Revering M.D.   On: 07/17/2017 11:45    Cardiac Studies   Echo just performed, formal report pending, preliminary review LVEF 35%, mild MR, moderate to severe TR with moderate pulmonary hypertension.  Patient Profile     81 y.o. male with history of ischemic cardiomyopathy (CAD with CABG 1990, PCI SVG-OM 3 2014, failed attempted PCI CTO-RCA 2015), LVEF 35%, history of VT status post ICD on background of diabetes mellitus, chronic kidney disease, hypertension, hyperlipidemia, carotid artery disease, PAD, iron deficiency anemia and chronic respiratory insufficiency on home oxygen  Assessment & Plan    1.  CHF: Improving with diuretics, still hypervolemic. Target BNP around 400, creatinine 1.3. Weight was 179 pounds in March, 157 pounds in July - he has lost a lot of true weight, and his "dry weight" is uncertain. Currently not on RAAS inhibitors due to volatile renal function, but these should be instituted when kidney function stabilizes. Replace potassium. 2. Ac on CKD stage 3: Rapid improvement in renal parameters during diuresis suggests that this was primarily a hemodynamic cause for acute renal insufficiency due to heart failure. 3. CAD: Currently without angina and no evidence of acute coronary syndrome. He has not had acute coronary events or attempts at revascularization in about 3 years. Clopidogrel is no longer mandatory and will discontinue at least temporarily until his anemia is fully diagnosed and resolved. 4. Anemia: is likely multifactorial but at least in part due to iron deficiency, source of blood loss unclear. Discontinue clopidogrel. Starting iron  supplements. 5. TR and PAH: Primarily secondary to left heart failure and volume overload. Review of the echo suggests that the defibrillator lead may be playing a role in the degree of TR, 6. Hx of VT s/p ICD: Continue beta blocker. He is on a low dose of bisoprolol due to low blood pressure. Keep potassium greater than 4 and magnesium greater than 2 to avoid recurrent ventricular arrhythmia.  Signed, Sanda Klein, MD  07/18/2017, 11:34 AM

## 2017-07-19 DIAGNOSIS — R079 Chest pain, unspecified: Secondary | ICD-10-CM

## 2017-07-19 DIAGNOSIS — I5022 Chronic systolic (congestive) heart failure: Secondary | ICD-10-CM

## 2017-07-19 LAB — GLUCOSE, CAPILLARY
GLUCOSE-CAPILLARY: 273 mg/dL — AB (ref 65–99)
Glucose-Capillary: 133 mg/dL — ABNORMAL HIGH (ref 65–99)
Glucose-Capillary: 242 mg/dL — ABNORMAL HIGH (ref 65–99)
Glucose-Capillary: 189 mg/dL — ABNORMAL HIGH (ref 65–99)

## 2017-07-19 LAB — BASIC METABOLIC PANEL
Anion gap: 9 (ref 5–15)
BUN: 38 mg/dL — AB (ref 6–20)
CO2: 37 mmol/L — ABNORMAL HIGH (ref 22–32)
CREATININE: 1.18 mg/dL (ref 0.61–1.24)
Calcium: 8.8 mg/dL — ABNORMAL LOW (ref 8.9–10.3)
Chloride: 94 mmol/L — ABNORMAL LOW (ref 101–111)
GFR calc Af Amer: >60 mL/min (ref >60)
GFR, EST NON AFRICAN AMERICAN: 57 mL/min — AB (ref >60)
GLUCOSE: 96 mg/dL (ref 65–99)
POTASSIUM: 3.2 mmol/L — AB (ref 3.5–5.1)
SODIUM: 140 mmol/L (ref 135–145)

## 2017-07-19 LAB — CBC
HCT: 28.7 % — ABNORMAL LOW (ref 39.0–52.0)
Hemoglobin: 8.7 g/dL — ABNORMAL LOW (ref 13.0–17.0)
MCH: 24 pg — ABNORMAL LOW (ref 26.0–34.0)
MCHC: 30.3 g/dL (ref 30.0–36.0)
MCV: 79.3 fL (ref 78.0–100.0)
PLATELETS: 144 10*3/uL — AB (ref 150–400)
RBC: 3.62 MIL/uL — AB (ref 4.22–5.81)
RDW: 15.2 % (ref 11.5–15.5)
WBC: 5.6 10*3/uL (ref 4.0–10.5)

## 2017-07-19 LAB — HEMOGLOBIN A1C
HEMOGLOBIN A1C: 7.1 % — AB (ref 4.8–5.6)
MEAN PLASMA GLUCOSE: 157.07 mg/dL

## 2017-07-19 LAB — MAGNESIUM: MAGNESIUM: 1.8 mg/dL (ref 1.7–2.4)

## 2017-07-19 MED ORDER — MAGNESIUM SULFATE 2 GM/50ML IV SOLN
2.0000 g | Freq: Once | INTRAVENOUS | Status: AC
  Administered 2017-07-19: 2 g via INTRAVENOUS
  Filled 2017-07-19: qty 50

## 2017-07-19 MED ORDER — LOSARTAN POTASSIUM 25 MG PO TABS
25.0000 mg | ORAL_TABLET | Freq: Every day | ORAL | Status: DC
  Administered 2017-07-19 – 2017-07-20 (×2): 25 mg via ORAL
  Filled 2017-07-19 (×2): qty 1

## 2017-07-19 MED ORDER — FUROSEMIDE 40 MG PO TABS
40.0000 mg | ORAL_TABLET | Freq: Two times a day (BID) | ORAL | Status: DC
  Administered 2017-07-19 – 2017-07-20 (×2): 40 mg via ORAL
  Filled 2017-07-19 (×2): qty 1

## 2017-07-19 MED ORDER — POTASSIUM CHLORIDE CRYS ER 20 MEQ PO TBCR
40.0000 meq | EXTENDED_RELEASE_TABLET | Freq: Once | ORAL | Status: AC
  Administered 2017-07-19: 40 meq via ORAL
  Filled 2017-07-19: qty 2

## 2017-07-19 MED ORDER — TRAZODONE HCL 50 MG PO TABS
50.0000 mg | ORAL_TABLET | Freq: Every day | ORAL | Status: DC
  Filled 2017-07-19: qty 1

## 2017-07-19 NOTE — Consult Note (Signed)
   [facility] CM Inpatient Consult   07/19/2017  MARISSA LOWREY 30-Dec-1935 [id number]   Patient assessed for Medicare ACO admission was hospitalized in June at another facility.  Admitted for chronic systolic HF with ICD and diuresing, IDDM, s/p CABG.  Met with the patient at bedside to follow up on any community care management needs.  Patient states he is seeking a new primary care provider and "should be at [facility] as we speak and that office is closer to my home."   Patient states that the therapy is suggesting therapy follow up at home and "thinks that's all needed will need right now."  General EMMI calls for follow up.  Patient did accept a brochure and contact information with the 24 hour nurse advise line.  For questions or changes, please contact:  Natividad Brood, RN BSN [facility] Liaison  [phone] business mobile phone Toll free office [phone]

## 2017-07-19 NOTE — Progress Notes (Signed)
PT refused CPAP. Will continue to monitor PT throughout the night.

## 2017-07-19 NOTE — Progress Notes (Signed)
Progress Note  Patient Name: Victor [name] Date of Encounter: 07/19/2017  Primary Cardiologist: Dr. Martinique   Subjective   Feeling well. No chest pain, sob or palpitations.   Inpatient Medications    Scheduled Meds: . aspirin EC  81 mg Oral Daily  . bisoprolol  2.5 mg Oral Daily  . DULoxetine  60 mg Oral Daily  . feeding supplement (ENSURE ENLIVE)  237 mL Oral BID BM  . furosemide  40 mg Oral BID  . insulin aspart  0-5 Units Subcutaneous QHS  . insulin aspart  0-9 Units Subcutaneous TID WC  . insulin detemir  20 Units Subcutaneous Daily  . losartan  25 mg Oral Daily  . potassium chloride  40 mEq Oral Daily  . pravastatin  40 mg Oral q1800  . pregabalin  150 mg Oral Daily  . sodium chloride flush  3 mL Intravenous Q12H  . traZODone  50 mg Oral QHS  . vitamin B-12  1,000 mcg Oral Daily   Continuous Infusions: . ferumoxytol     PRN Meds: acetaminophen **OR** acetaminophen, bisacodyl, nitroGLYCERIN, ondansetron **OR** ondansetron (ZOFRAN) IV, senna-docusate   Vital Signs    Vitals:   07/18/17 1645 07/18/17 1959 07/19/17 0430 07/19/17 0800  BP: (!) 123/56 (!) 119/36 (!) 141/52 (!) 141/52  Pulse: 68 60 66 70  Resp: 18 18 18 18   Temp: 98.2 F (36.8 C) (!) 97.4 F (36.3 C) 98.5 F (36.9 C) 98.1 F (36.7 C)  TempSrc: Oral Oral Oral Oral  SpO2: 99% 98% 100% 100%  Weight:   154 lb 6.4 oz (70 kg)   Height:        Intake/Output Summary (Last 24 hours) at 07/19/17 1054 Last data filed at 07/19/17 1000  Gross per 24 hour  Intake              912 ml  Output             2350 ml  Net            -1438 ml   Filed Weights   07/17/17 0042 07/18/17 0629 07/19/17 0430  Weight: 164 lb 4.8 oz (74.5 kg) 165 lb 3.2 oz (74.9 kg) 154 lb 6.4 oz (70 kg)    Telemetry    Sinus with PACs - Personally Reviewed  ECG    None today   Physical Exam   GEN: No acute distress.   Neck: + JVD Cardiac: Irregular , systolic murmurs, rubs, or gallops.  Respiratory: Clear to  auscultation bilaterally. GI: Soft, nontender, non-distended  MS: No edema; No deformity. Neuro:  Nonfocal  Psych: Normal affect   Labs    Chemistry Recent Labs Lab 07/17/17 0040 07/18/17 0503 07/19/17 0509  NA 130* 137 140  K 4.6 3.2* 3.2*  CL 90* 94* 94*  CO2 32 34* 37*  GLUCOSE 106* 139* 96  BUN 46* 43* 38*  CREATININE 2.32* 1.55* 1.18  CALCIUM 8.4* 8.4* 8.8*  GFRNONAA 25* 41* 57*  GFRAA 29* 47* >60  ANIONGAP 8 9 9      Hematology Recent Labs Lab 07/17/17 0040 07/18/17 0503 07/18/17 1649 07/19/17 0509  WBC 7.1 5.2  --  5.6  RBC 3.31*  3.33* 3.13*  --  3.62*  HGB 7.8* 7.4* 8.4* 8.7*  HCT 25.5* 24.2* 27.9* 28.7*  MCV 77.0* 77.3*  --  79.3  MCH 23.6* 23.6*  --  24.0*  MCHC 30.6 30.6  --  30.3  RDW 15.7* 15.3  --  15.2  PLT 152 122*  --  144*    Cardiac EnzymesNo results for input(s): TROPONINI in the last 168 hours.  Recent Labs Lab 07/16/17 2045  TROPIPOC 0.01     BNP Recent Labs Lab 07/16/17 2041  BNP 1,436.0*     DDimer No results for input(s): DDIMER in the last 168 hours.   Radiology    US Renal  Result Date: 07/17/2017 CLINICAL DATA:  Acute kidney injury. EXAM: RENAL / URINARY TRACT ULTRASOUND COMPLETE COMPARISON:  None. FINDINGS: Right Kidney: Length: 10.2 cm. Echogenicity within normal limits. No mass or hydronephrosis visualized. Left Kidney: Length: 10.4 cm. Echogenicity within normal limits. No mass or hydronephrosis visualized. Bladder: Appears normal for degree of bladder distention. Other: Enlarged spleen measuring 14.2 cm in length, 13.2 cm in width and 8.0 cm in AP diameter with a calculated volume of 750 cc. IMPRESSION: 1. Normal appearing kidneys with no hydronephrosis. 2. Splenomegaly. Electronically Signed   By: Claudie Revering M.D.   On: 07/17/2017 11:45    Cardiac Studies   Echo 07/18/17 Study Conclusions  - Left ventricle: The cavity size was normal. Wall thickness was   increased in a pattern of mild LVH. Systolic function  was   moderately reduced. The estimated ejection fraction was in the   range of 35% to 40%. There is akinesis of the basal-midinferior   myocardium. - Mitral valve: Calcified annulus. There was mild regurgitation. - Left atrium: The atrium was severely dilated. - Right ventricle: Pacer wire or catheter noted in right ventricle. - Tricuspid valve: There was moderate regurgitation. - Pulmonary arteries: Systolic pressure was mildly to moderately   increased. PA peak pressure: 49 mm Hg (S).  Impressions:  - Compared to the prior study, there has been no significant   interval change.  Patient Profile     81 y.o. male with history of ischemic cardiomyopathy (CAD with CABG 1990, PCI SVG-OM 3 2014, failed attempted PCI CTO-RCA 2015), LVEF 35%, history of VT status post ICD on background of diabetes mellitus, chronic kidney disease, hypertension, hyperlipidemia, carotid artery disease, PAD, iron deficiency anemia and chronic respiratory insufficiency on home oxygen.   Assessment & Plan    1. Acute on chronic systolic CHF - Uncertain dry weight. Net I & O negative 3.2L with weight loss of 11lb (165-->154) doubt accurate. Very close to euvolemic. Change lasix to 40mg  BID po today. Scr normal. Restart Losartan 25mg  qd. Check renal function tomorrow. If stable, DC tomorrow. Echo as above.   2. Hypokalemia - On daily dose of Kdur 40mg  qd. K of 3.2 today. Given extra Perry 28meq. Losartan restarted. Recheck BMET tomorrow> likely needs BID dose with diuretics.   3. CAD - he was taken PRN nitro prior to presentation due CP and SOB. Did not required here. Likely due to CHF. Ambulated this morning without angina. Follow closely.   4. Anemia - FOBT negative. Per primary team  5. Hx of VT s/p ICD - On BB. Keep K > 4 and Mg 2.   6. HTN - minimally elevated today. Continue low dose BB. Resumed Losartan today.   Signed, Leanor Kail, PA  07/19/2017, 10:54 AM    I have seen and examined  the patient along with Bhagat,Bhavinkumar, PA .  I have reviewed the chart, notes and new data.  I agree with PA's note.  Key new complaints: no dyspnea at rest or with exertion. Develops posterior cervical pain whenever he tries to walk longer distances, alleviated by lying  down. Describes 4 falls at home - a couple are clearly in a pattern c/w orthostatic hypotension when he first gets up in the morning. On other occasions, his legs "turn to jello beneath the knees" and buckle (deconditioning/weakness, spinal stenosis?) Key examination changes: normal JVP, no rales, no edema. Key new findings / data: K 3.2, creat much better after diuresis.  PLAN: Switch to PO diuretics, keep weight under 155 lb. Reviewed sodium restriction and daily weights. Restart losartan. Probable DC in 24 h. Get bedside commode to reduce fall risk. PT eval, home health PT appropriate? Review treatment options for spine disease w his orthopedic specialist.  Sanda Klein, MD, [place] [phone] 07/19/2017, 11:26 AM

## 2017-07-19 NOTE — Progress Notes (Signed)
PROGRESS NOTE   Victor [name]  GUR:[id number] DOB: 1936-02-26 DOA: 07/16/2017 PCP: Cyndi Bender, PA-C  Brief Narrative:  Victor [name] is a 81 y.o. male with medical history significant for coronary artery disease status post CABG, chronic systolic CHF with ICD in place, insulin dependent diabetes mellitus, hypertension, chronic pain, and anemia, and into the emergency department for evaluation of recurrent falls. Patient typically ambulates with a walker, but over the past couple days has fallen multiple times due to his legs "giving out." He was hospitalized for acute CHF in June, has been following with cardiology, and had his Lasix doubled and metolazone added approximately one week ago. He reports good improvement in his edema with the medication change, and had been pleased with the results, but over the past couple days has experienced recurring episodes of his legs "giving out." He denies headache, change in vision or hearing, or focal numbness or weakness. No fevers or chills. Denies orthopnea, dyspnea, or cough.  Patient admitted and noted to be volume overloaded on examination. Patient placed on IV Lasix with good urine output. Patient's renal function also improved. Cardiology was consulted due to patient's complex cardiac history.  Assessment & Plan:   Principal Problem:   AKI (acute kidney injury) ([place]) Active Problems:   Insulin dependent type 2 diabetes mellitus, controlled (HCC)   Chronic clinical systolic heart failure (HCC)   Microcytic anemia   Essential hypertension   Hx of CABG 1990   OSA (obstructive sleep apnea)   Emphysema of lung (HCC)   Hyponatremia   Chest pain   Acute on chronic combined systolic and diastolic CHF (congestive heart failure) (HCC)   Ischemic cardiomyopathy  #1 acute kidney injury Likely secondary to acute on chronic combined systolic and diastolic heart failure. Patient noted to be significantly volume overloaded on examination. Creatinine  was 0.87 in June 2018. Creatinine on admission was up to 2.37. Urine studies pending. Renal ultrasound negative for hydronephrosis. ACE inhibitor and ARB on hold. Patient placed on IV diuretics with good urine output. Renal function trending down.  Cards switching to orals today.     #2 acute on chronic combined systolic and diastolic heart failure Patient noted to be volume overloaded on examination with lower extremity edema and diffuse crackles on clinical examination. Saline lock IV fluids. 2-D echo with a EF of 35-40% with akinesis of basal midinferior myocardium. Mild mitral valvular regurgitation. Moderate tricuspid valvular regurgitation. Patient placed on IV Lasix with a urine output of 1.650 L.  Continues cardiac regimen zebeta, aspirin, statin and Plavix and IV Lasix. Cardiology following and appreciate input and recommendations.  Filed Weights   07/17/17 0042 07/18/17 0629 07/19/17 0430  Weight: 74.5 kg (164 lb 4.8 oz) 74.9 kg (165 lb 3.2 oz) 70 kg (154 lb 6.4 oz)  Weight change: -4.899 kg (-10 lb 12.8 oz)  #3 hypertension Continue current regimen of zebeta.  #4 insulin-dependent diabetes mellitus type 2 hemoglobin A1c 7.1%.   Continue Levemir and sliding scale insulin. CBG (last 3)   Recent Labs  07/18/17 2105 07/19/17 0718 07/19/17 1131  GLUCAP 282* 133* 242*   #5 mitral regurgitation/tricuspid regurgitation 2-D echo with a EF of 35-40% with akinesis of basal midinferior myocardium. Mild mitral valvular regurgitation. Left atrium severely dilated. Moderate tricuspid valvular regurgitation. No significant change from prior 2-D echo.   #6 history of ischemic cardiomyopathy with history of V. tach in 2012 status post ICD Continue beta blocker. Keep magnesium greater than 2. Keep potassium greater  than 4. Per cardiology.  Give additional magnesium today.   #7 hyperlipidemia Continue statin.  #8 hyponatremia Likely secondary to hypervolemic hyponatremia.  Improved with  diuretics. Continue Lasix.   #9 coronary artery disease status post CABG No current chest pain.  Continue cardiac regimen of aspirin, statin, Plavix,zebeta. On Lasix for diuresis.  #10 microcytic anemia  Patient with a hemoglobin down to 7.4. FOBT negative. No overt bleeding. S/p 1 unit packed red blood cells. Follow H&H. Monitor for volume overload. Will give a dose of IV Feraheme. May benefit from oral iron supplementation on discharge.  Hypokalemia - replace orally today, give additional magnesium today.   DVT prophylaxis: SCDs Code Status: Full Family Communication: Updated patient. No family at bedside. Disposition Plan: Home with [facility] services    Consultants:   Cardiology: Dr.Croitoru 07/17/2017  Procedures:   CT head 07/16/2017  Chest x-ray 26 2018  Renal ultrasound 07/17/2017  1 unit packed red blood cells 07/18/2017\  2-D echo 07/18/2017  Antimicrobials:   None   Subjective: Pt says that he didn't sleep well last night but his SOB is better today.   Objective: Vitals:   07/18/17 1959 07/19/17 0430 07/19/17 0800 07/19/17 1122  BP: (!) 119/36 (!) 141/52 (!) 141/52 (!) 136/52  Pulse: 60 66 70 75  Resp: 18 18 18 18   Temp: (!) 97.4 F (36.3 C) 98.5 F (36.9 C) 98.1 F (36.7 C) 97.8 F (36.6 C)  TempSrc: Oral Oral Oral Oral  SpO2: 98% 100% 100% 100%  Weight:  70 kg (154 lb 6.4 oz)    Height:        Intake/Output Summary (Last 24 hours) at 07/19/17 1212 Last data filed at 07/19/17 1000  Gross per 24 hour  Intake              912 ml  Output             2050 ml  Net            -1138 ml   Filed Weights   07/17/17 0042 07/18/17 0629 07/19/17 0430  Weight: 74.5 kg (164 lb 4.8 oz) 74.9 kg (165 lb 3.2 oz) 70 kg (154 lb 6.4 oz)    Examination:  General exam: Awake, alert, NAD. Cooperative.   Respiratory system: Respiratory effort normal. Cardiovascular system: Irregularly irregular with 3/6 SEM. + JVD, murmurs, rubs, gallops or clicks. Trace bilateral  lower extremity edema. Gastrointestinal system: Abdomen is nondistended, soft and nontender. No organomegaly or masses felt. Normal bowel sounds heard. Central nervous system: Alert and oriented. No focal neurological deficits. Extremities: Symmetric 5 x 5 power. Skin: No rashes, lesions or ulcers Psychiatry: Judgement and insight appear normal. Mood & affect appropriate.   Data Reviewed: I have personally reviewed following labs and imaging studies  CBC:  Recent Labs Lab 07/16/17 2041 07/17/17 0040 07/18/17 0503 07/18/17 1649 07/19/17 0509  WBC 8.4 7.1 5.2  --  5.6  HGB 7.9* 7.8* 7.4* 8.4* 8.7*  HCT 25.6* 25.5* 24.2* 27.9* 28.7*  MCV 77.1* 77.0* 77.3*  --  79.3  PLT 135* 152 122*  --  099*   Basic Metabolic Panel:  Recent Labs Lab 07/16/17 2041 07/17/17 0040 07/18/17 0503 07/19/17 0509  NA 126* 130* 137 140  K 4.9 4.6 3.2* 3.2*  CL 87* 90* 94* 94*  CO2 28 32 34* 37*  GLUCOSE 102* 106* 139* 96  BUN 45* 46* 43* 38*  CREATININE 2.37* 2.32* 1.55* 1.18  CALCIUM 8.3* 8.4* 8.4* 8.8*  MG  --   --  2.0 1.8   GFR: Estimated Creatinine Clearance: 47.5 mL/min (by C-G formula based on SCr of 1.18 mg/dL). Liver Function Tests: No results for input(s): AST, ALT, ALKPHOS, BILITOT, PROT, ALBUMIN in the last 168 hours. No results for input(s): LIPASE, AMYLASE in the last 168 hours. No results for input(s): AMMONIA in the last 168 hours. Coagulation Profile: No results for input(s): INR, PROTIME in the last 168 hours. Cardiac Enzymes: No results for input(s): CKTOTAL, CKMB, CKMBINDEX, TROPONINI in the last 168 hours. BNP (last 3 results) No results for input(s): PROBNP in the last 8760 hours. HbA1C:  Recent Labs  07/19/17 0509  HGBA1C 7.1*   CBG:  Recent Labs Lab 07/18/17 1156 07/18/17 1635 07/18/17 2105 07/19/17 0718 07/19/17 1131  GLUCAP 239* 171* 282* 133* 242*   Lipid Profile: No results for input(s): CHOL, HDL, LDLCALC, TRIG, CHOLHDL, LDLDIRECT in the last  72 hours. Thyroid Function Tests: No results for input(s): TSH, T4TOTAL, FREET4, T3FREE, THYROIDAB in the last 72 hours. Anemia Panel:  Recent Labs  07/17/17 0040  VITAMINB12 1,479*  FOLATE 9.8  FERRITIN 53  TIBC 322  IRON 16*  RETICCTPCT 1.6   Sepsis Labs: No results for input(s): PROCALCITON, LATICACIDVEN in the last 168 hours.  No results found for this or any previous visit (from the past 240 hour(s)).   Radiology Studies: No results found.  Scheduled Meds: . aspirin EC  81 mg Oral Daily  . bisoprolol  2.5 mg Oral Daily  . DULoxetine  60 mg Oral Daily  . feeding supplement (ENSURE ENLIVE)  237 mL Oral BID BM  . furosemide  40 mg Oral BID  . insulin aspart  0-5 Units Subcutaneous QHS  . insulin aspart  0-9 Units Subcutaneous TID WC  . insulin detemir  20 Units Subcutaneous Daily  . losartan  25 mg Oral Daily  . potassium chloride  40 mEq Oral Daily  . pravastatin  40 mg Oral q1800  . pregabalin  150 mg Oral Daily  . sodium chloride flush  3 mL Intravenous Q12H  . traZODone  50 mg Oral QHS  . vitamin B-12  1,000 mcg Oral Daily   Continuous Infusions:   LOS: 3 days    Time spent: 32 minutes  Irwin Brakeman, MD [facility] Pager [phone]  If 7PM-7AM, please contact night-coverage [email or web address] Password [email or web address] 07/19/2017, 12:12 PM

## 2017-07-19 NOTE — Progress Notes (Signed)
Physical Therapy Treatment Patient Details Name: Victor [name] MRN: [id number] DOB: May 27, 1936 Today's Date: 07/19/2017    History of Present Illness Victor [name] is a 81 y.o. male with medical history significant for coronary artery disease status post CABG, chronic systolic CHF with ICD in place, insulin dependent diabetes mellitus, hypertension, chronic pain, and anemia, and into the emergency department for evaluation of recurrent falls. Patient typically ambulates with a walker, but over the past couple days has fallen multiple times due to his legs "giving out." He was hospitalized for acute CHF in June, has been following with cardiology, and had his Lasix doubled and metolazone added approximately one week ago. He reports good improvement in his edema with the medication change, and had been pleased with the results, but over the past couple days has experienced recurring episodes of his legs "giving out."     PT Comments    Patient tolerated gait distance of 193ft with min A and +2 for chair follow. Pt a little more steady this session vs previously noted session however does continue to report pain originating in back of head and radiating down through bilat shoulders limiting his mobility. Pt with difficulty with cervical extension due to pain. Continue to progress as tolerated.    Follow Up Recommendations  Home health PT;Supervision/Assistance - 24 hour     Equipment Recommendations  None recommended by PT    Recommendations for Other Services       Precautions / Restrictions Precautions Precautions: Fall Restrictions Weight Bearing Restrictions: No    Mobility  Bed Mobility Overal bed mobility: Independent                Transfers Overall transfer level: Needs assistance Equipment used: Rolling walker (2 wheeled) Transfers: Sit to/from Stand Sit to Stand: Supervision         General transfer comment: supervision for safety from EOB and recliner; cues  for safety  Ambulation/Gait Ambulation/Gait assistance: Min assist;+2 safety/equipment Ambulation Distance (Feet): 150 Feet Assistive device: Rolling walker (2 wheeled) Gait Pattern/deviations: Step-through pattern;Decreased stride length;Wide base of support;Trunk flexed Gait velocity: decreased   General Gait Details: cues for posture and proximity of RW; pt with increased pain in back of head and beginning to radiate through bilat shoulders; pt required seated rest break due to pain   Stairs            Wheelchair Mobility    Modified Rankin (Stroke Patients Only)       Balance Overall balance assessment: Needs assistance;History of Falls Sitting-balance support: No upper extremity supported;Feet supported Sitting balance-Leahy Scale: Good     Standing balance support: Bilateral upper extremity supported;During functional activity Standing balance-Leahy Scale: Poor                              Cognition Arousal/Alertness: Awake/alert Behavior During Therapy: WFL for tasks assessed/performed;Flat affect Overall Cognitive Status: Within Functional Limits for tasks assessed                                        Exercises      General Comments        Pertinent Vitals/Pain Pain Assessment: Faces Faces Pain Scale: Hurts even more Pain Location: back of head and down through bilat shoulders Pain Descriptors / Indicators: Grimacing;Guarding;Sharp;Radiating Pain Intervention(s): Limited activity within patient's tolerance;Monitored  during session;Repositioned    Home Living                      Prior Function            PT Goals (current goals can now be found in the care plan section) Acute Rehab PT Goals Patient Stated Goal: to go home PT Goal Formulation: With patient Time For Goal Achievement: 07/31/17 Potential to Achieve Goals: Good Progress towards PT goals: Progressing toward goals    Frequency    Min  3X/week      PT Plan Current plan remains appropriate    Co-evaluation              AM-PAC PT "6 Clicks" Daily Activity  Outcome Measure  Difficulty turning over in bed (including adjusting bedclothes, sheets and blankets)?: None Difficulty moving from lying on back to sitting on the side of the bed? : None Difficulty sitting down on and standing up from a chair with arms (e.g., wheelchair, bedside commode, etc,.)?: A Little Help needed moving to and from a bed to chair (including a wheelchair)?: A Little Help needed walking in hospital room?: A Little Help needed climbing 3-5 steps with a railing? : A Lot 6 Click Score: 19    End of Session Equipment Utilized During Treatment: Gait belt;Oxygen Activity Tolerance: Patient limited by pain Patient left: in chair;with call bell/phone within reach;with family/visitor present Nurse Communication: Mobility status PT Visit Diagnosis: Unsteadiness on feet (R26.81);Muscle weakness (generalized) (M62.81);Repeated falls (R29.6);History of falling (Z91.81)     Time: 1000-1017 PT Time Calculation (min) (ACUTE ONLY): 17 min  Charges:  $Gait Training: 8-22 mins                    G Codes:       Earney Navy, PTA Pager: [phone]     Darliss Cheney 07/19/2017, 10:47 AM

## 2017-07-20 DIAGNOSIS — E871 Hypo-osmolality and hyponatremia: Secondary | ICD-10-CM

## 2017-07-20 DIAGNOSIS — I255 Ischemic cardiomyopathy: Secondary | ICD-10-CM

## 2017-07-20 DIAGNOSIS — D509 Iron deficiency anemia, unspecified: Secondary | ICD-10-CM

## 2017-07-20 DIAGNOSIS — G4733 Obstructive sleep apnea (adult) (pediatric): Secondary | ICD-10-CM

## 2017-07-20 DIAGNOSIS — E119 Type 2 diabetes mellitus without complications: Secondary | ICD-10-CM

## 2017-07-20 DIAGNOSIS — Z951 Presence of aortocoronary bypass graft: Secondary | ICD-10-CM

## 2017-07-20 DIAGNOSIS — Z794 Long term (current) use of insulin: Secondary | ICD-10-CM

## 2017-07-20 LAB — BASIC METABOLIC PANEL
ANION GAP: 8 (ref 5–15)
BUN: 26 mg/dL — ABNORMAL HIGH (ref 6–20)
CHLORIDE: 97 mmol/L — AB (ref 101–111)
CO2: 33 mmol/L — AB (ref 22–32)
Calcium: 9.1 mg/dL (ref 8.9–10.3)
Creatinine, Ser: 1.06 mg/dL (ref 0.61–1.24)
GFR calc non Af Amer: >60 mL/min (ref >60)
Glucose, Bld: 187 mg/dL — ABNORMAL HIGH (ref 65–99)
Potassium: 4.5 mmol/L (ref 3.5–5.1)
Sodium: 138 mmol/L (ref 135–145)

## 2017-07-20 LAB — GLUCOSE, CAPILLARY
Glucose-Capillary: 99 mg/dL (ref 65–99)
Glucose-Capillary: 197 mg/dL — ABNORMAL HIGH (ref 65–99)
Glucose-Capillary: 284 mg/dL — ABNORMAL HIGH (ref 65–99)

## 2017-07-20 MED ORDER — FUROSEMIDE 40 MG PO TABS: 40.0000 mg | ORAL_TABLET | Freq: Two times a day (BID) | ORAL | 0 refills | Status: DC

## 2017-07-20 MED ORDER — POTASSIUM CHLORIDE CRYS ER 20 MEQ PO TBCR: 40.0000 meq | EXTENDED_RELEASE_TABLET | Freq: Every day | ORAL | 0 refills | Status: DC

## 2017-07-20 NOTE — Progress Notes (Addendum)
CM attempted to talk to patient about [facility] services, he requested that I talk to his family member when they arrive to transport him home. CM informed patient to tell the family to bring a portable oxygen tank from home. Aneta Mins [phone]  CM called patient's grandson [name] to offer [place] choice, he chose [facility]; [place] with [facility] called for arrangements; 3:1 ordered and to be delivered to the room today prior to discharging home. Mindi Slicker [facility] [phone]

## 2017-07-20 NOTE — Care Management Important Message (Signed)
Important Message  Patient Details  Name: DAYRON ODLAND MRN: [id number] Date of Birth: 1935/12/17   Medicare Important Message Given:  Yes    Lyrik Buresh 07/20/2017, 12:02 PM

## 2017-07-20 NOTE — Discharge Summary (Addendum)
Physician Discharge Summary  AQEEL NORGAARD KGU:[id number] DOB: 04/20/1936 DOA: 07/16/2017  PCP: Cyndi Bender, PA-C  Admit date: 07/16/2017 Discharge date: 07/20/2017  Admitted From: HOME  Disposition: HOME  Recommendations for Outpatient Follow-up:  1. Follow up with PCP in 1 weeks 2. Follow up with cardiology in 1-2 weeks as scheduled 3. Please obtain BMP/CBC in 1 week  Discharge Condition: STABLE   CODE STATUS: FULL    Brief Hospitalization Summary: Please see all hospital notes, images, labs for full details of the hospitalization. HPI: Victor [name] is a 81 y.o. male with medical history significant for coronary artery disease status post CABG, chronic systolic CHF with ICD in place, insulin dependent diabetes mellitus, hypertension, chronic pain, and anemia, and into the emergency department for evaluation of recurrent falls. Patient typically ambulates with a walker, but over the past couple days has fallen multiple times due to his legs "giving out." He was hospitalized for acute CHF in June, has been following with cardiology, and had his Lasix doubled and metolazone added approximately one week ago. He reports good improvement in his edema with the medication change, and had been pleased with the results, but over the past couple days has experienced recurring episodes of his legs "giving out." He denies headache, change in vision or hearing, or focal numbness or weakness. No fevers or chills. Denies orthopnea, dyspnea, or cough.  ED Course: Upon arrival to the ED, patient is found to be afebrile, saturating well on his usual 4 L/m supplemental oxygen, and with vital signs otherwise stable. EKG features atrial fibrillation with low voltage QRS. Chest x-ray is notable for mild cardiomegaly but no acute process. Noncontrast head CT is negative for acute intracranial abnormality. Chemistry panel features a sodium of 126, chloride 87, BUN 45, and creatinine of 2.37, up from 0.87 in June of  this year. CBC is notable for microcytic anemia with hemoglobin of 7.9, down from 10.1 in June. Platelets are slightly low at 135,000 and MCV is a little low at 77.1. Troponin is within the normal limits and BNP is elevated to 1436. Fecal occult blood testing was negative. Urinalysis has been ordered and remained pending. Patient remained hemodynamically stable and in no apparent respiratory distress and the emergency department, and he will be admitted to the telemetry unit for ongoing evaluation and management of acute kidney injury and hyponatremia in a patient with chronic systolic heart failure on diuretics.   #1 acute kidney injury Likely secondary to acute on chronic combined systolic and diastolic heart failure. Patient noted to be significantly volume overloaded on examination. Creatinine was 0.87 in June 2018. Creatinine on admission was up to 2.37. Urine studies pending. Renal ultrasound negative for hydronephrosis. ACE inhibitor and ARB on hold. Patient placed on IV diuretics with good urine output. Renal function trending down.  Cards switched to orals and ok to discharge home.     #2 acute on chronic combined systolic and diastolic heart failure Patient noted to be volume overloaded on examination with lower extremity edema and diffuse crackles on clinical examination. Saline lock IV fluids. 2-D echo with a EF of 35-40% with akinesis of basal midinferior myocardium. Mild mitral valvular regurgitation. Moderate tricuspid valvular regurgitation. Patient placed on IV Lasix with a urine output of 1.650 L.  Continues cardiac regimen zebeta, aspirin, statin and Plavix and IV Lasix. Cardiology following and appreciate input and recommendations. Ok to discharge home.  See notes.       Filed Weights   07/17/17  5956 07/18/17 0629 07/19/17 0430  Weight: 74.5 kg (164 lb 4.8 oz) 74.9 kg (165 lb 3.2 oz) 70 kg (154 lb 6.4 oz)  Weight change: -4.899 kg (-10 lb 12.8 oz)  #3 hypertension Continue  current regimen of zebeta.  #4 insulin-dependent diabetes mellitus type 2 hemoglobin A1c 7.1%.   Continue Levemir and sliding scale insulin. CBG (last 3)  CBG (last 3)   Recent Labs  07/19/17 2053 07/20/17 0735 07/20/17 1132  GLUCAP 273* 99 197*   #5 mitral regurgitation/tricuspid regurgitation 2-D echo with a EF of 35-40% with akinesis of basal midinferior myocardium. Mild mitral valvular regurgitation. Left atrium severely dilated. Moderate tricuspid valvular regurgitation. No significant change from prior 2-D echo.   #6 history of ischemic cardiomyopathy with history of V. tach in 2012 status post ICD Continue beta blocker. Keep magnesium greater than 2. Keep potassium greater than 4. Per cardiology.  Give additional magnesium today.   #7 hyperlipidemia Continue statin.  #8 hyponatremia Likely secondary to hypervolemic hyponatremia.  Improved with diuretics. Continue Lasix.   #9 coronary artery disease status post CABG No current chest pain.  Continue cardiac regimen of aspirin, statin, Plavix,zebeta. On Lasix for diuresis.  #10 microcytic anemia  Patient with a hemoglobin down to 7.4. FOBT negative. No overt bleeding. S/p 1 unit packed red blood cells. Follow H&H. Monitor for volume overload. Will give a dose of IV Feraheme. May benefit from oral iron supplementation on discharge.  Chronic atrial fibrillation  Hypokalemia - replace orally today, give additional magnesium today.   DVT prophylaxis: SCDs Code Status: Full Family Communication: Updated patient. No family at bedside. Disposition Plan: Home with [facility] services  HHPT  Consultants:   Cardiology: Dr.Croitoru 07/17/2017  Procedures:   CT head 07/16/2017  Chest x-ray 26 2018  Renal ultrasound 07/17/2017  1 unit packed red blood cells 07/18/2017\  2-D echo 07/18/2017   Discharge Diagnoses:  Principal Problem:   AKI (acute kidney injury) ([place]) Active Problems:   Insulin dependent type 2  diabetes mellitus, controlled ([place])   Chronic clinical systolic heart failure ([facility])   Microcytic anemia   Essential hypertension   Hx of CABG 1990   OSA (obstructive sleep apnea)   Emphysema of lung (H[facility])   Hyponatremia   Chest pain   Acute on chronic combined systolic and diastolic CHF (congestive heart failure) ([place])   Ischemic cardiomyopathy  Discharge Instructions: Discharge Instructions    Call MD for:  difficulty breathing, headache or visual disturbances    Complete by:  As directed    Call MD for:  extreme fatigue    Complete by:  As directed    Call MD for:  hives    Complete by:  As directed    Call MD for:  persistant dizziness or light-headedness    Complete by:  As directed    Call MD for:  persistant nausea and vomiting    Complete by:  As directed    Call MD for:  severe uncontrolled pain    Complete by:  As directed    Diet - low sodium heart healthy    Complete by:  As directed    Increase activity slowly    Complete by:  As directed      Allergies as of 07/20/2017      Reactions   Coreg [carvedilol] Other (See Comments)   Shortness of breath ,fatigue ,dizzyness    Lovastatin Other (See Comments)   CK elevation, Myalgias   Ativan [lorazepam] Other (  See Comments)   Pt. had side effects      Medication List    TAKE these medications   ASPIR-81 81 MG EC tablet Generic drug:  aspirin Take 81 mg by mouth at bedtime.   bisoprolol 5 MG tablet Commonly known as:  ZEBETA Take 0.5 tablets (2.5 mg total) by mouth daily.   clopidogrel 75 MG tablet Commonly known as:  PLAVIX Take 1 tablet (75 mg total) by mouth daily.   DULoxetine 30 MG capsule Commonly known as:  CYMBALTA Take 60 mg by mouth daily. .   furosemide 40 MG tablet Commonly known as:  LASIX Take 1 tablet (40 mg total) by mouth 2 (two) times daily. What changed:  when to take this   LEVEMIR FLEXTOUCH 100 UNIT/ML Pen Generic drug:  Insulin Detemir INJECT 30 UNITS UNDER SKIN EACH  MORNING   losartan 50 MG tablet Commonly known as:  COZAAR Take 0.5 tablets (25 mg total) by mouth daily.   LYRICA 150 MG capsule Generic drug:  pregabalin Take 150 mg by mouth daily.   metFORMIN 1000 MG tablet Commonly known as:  GLUCOPHAGE Take 1,000 mg by mouth 2 (two) times daily with a meal.   mupirocin ointment 2 % Commonly known as:  BACTROBAN Apply 1 application topically 2 (two) times daily.   nitroGLYCERIN 0.4 MG SL tablet Commonly known as:  NITROSTAT Place 0.4 mg under the tongue every 5 (five) minutes as needed for chest pain.   omeprazole 20 MG capsule Commonly known as:  PRILOSEC TAKE 1 CAPSULE BY MOUTH ONCE DAILY   ONE TOUCH ULTRA TEST test strip Generic drug:  glucose blood CHECK SUGAR AS DIRECTED 3 TIMES DAILY   potassium chloride SA 20 MEQ tablet Commonly known as:  K-DUR,KLOR-CON Take 2 tablets (40 mEq total) by mouth daily.   pravastatin 40 MG tablet Commonly known as:  PRAVACHOL Take 1 tablet (40 mg total) by mouth at bedtime.   SANTYL ointment Generic drug:  collagenase Apply 1 application topically every other day.   traZODone 50 MG tablet Commonly known as:  DESYREL Take 1 tablet by mouth at bedtime.   vitamin B-12 1000 MCG tablet Commonly known as:  CYANOCOBALAMIN Take 1,000 mcg by mouth daily.            Durable Medical Equipment        Start     Ordered   07/20/17 1131  For home use only DME Bedside commode  Once    Question:  Patient needs a bedside commode to treat with the following condition  Answer:  At risk for falls   07/19/17 1132       Discharge Care Instructions        Start     Ordered   07/21/17 0000  potassium chloride SA (K-DUR,KLOR-CON) 20 MEQ tablet  Daily     07/20/17 1209   07/20/17 0000  furosemide (LASIX) 40 MG tablet  2 times daily     07/20/17 1209   07/20/17 0000  Increase activity slowly     07/20/17 1209   07/20/17 0000  Diet - low sodium heart healthy     07/20/17 1209   07/20/17 0000   Call MD for:  persistant nausea and vomiting     07/20/17 1209   07/20/17 0000  Call MD for:  severe uncontrolled pain     07/20/17 1209   07/20/17 0000  Call MD for:  difficulty breathing, headache or visual disturbances  07/20/17 1209   07/20/17 0000  Call MD for:  hives     07/20/17 1209   07/20/17 0000  Call MD for:  persistant dizziness or light-headedness     07/20/17 1209   07/20/17 0000  Call MD for:  extreme fatigue     07/20/17 1209     Follow-up Information    Almyra Deforest, PA. Go on 08/04/2017.   Specialties:  Cardiology, Radiology Why:  @2 :30pm for post hospital follow up Contact information: [address] [place] [address] [phone]        Cyndi Bender, PA-C. Schedule an appointment as soon as possible for a visit in 1 week(s).   Specialty:  Physician Assistant Why:  Hospital Follow Up  Contact information: [place] [address] [phone]          Allergies  Allergen Reactions  . Coreg [Carvedilol] Other (See Comments)    Shortness of breath ,fatigue ,dizzyness   . Lovastatin Other (See Comments)    CK elevation, Myalgias  . Ativan [Lorazepam] Other (See Comments)    Pt. had side effects   Current Discharge Medication List    START taking these medications   Details  potassium chloride SA (K-DUR,KLOR-CON) 20 MEQ tablet Take 2 tablets (40 mEq total) by mouth daily. Qty: 60 tablet, Refills: 0      CONTINUE these medications which have CHANGED   Details  furosemide (LASIX) 40 MG tablet Take 1 tablet (40 mg total) by mouth 2 (two) times daily. Qty: 60 tablet, Refills: 0      CONTINUE these medications which have NOT CHANGED   Details  aspirin (ASPIR-81) 81 MG EC tablet Take 81 mg by mouth at bedtime.     bisoprolol (ZEBETA) 5 MG tablet Take 0.5 tablets (2.5 mg total) by mouth daily. Qty: 90 tablet, Refills: 3   Associated Diagnoses: Cardiomyopathy, ischemic    clopidogrel (PLAVIX) 75 MG tablet Take  1 tablet (75 mg total) by mouth daily. Qty: 90 tablet, Refills: 3   Associated Diagnoses: Cardiomyopathy, ischemic    DULoxetine (CYMBALTA) 30 MG capsule Take 60 mg by mouth daily. Marland Kitchen    LEVEMIR FLEXTOUCH 100 UNIT/ML Pen INJECT 30 UNITS UNDER SKIN EACH MORNING Refills: 1    losartan (COZAAR) 50 MG tablet Take 0.5 tablets (25 mg total) by mouth daily. Qty: 90 tablet, Refills: 3    LYRICA 150 MG capsule Take 150 mg by mouth daily.  Refills: 1    metFORMIN (GLUCOPHAGE) 1000 MG tablet Take 1,000 mg by mouth 2 (two) times daily with a meal.    mupirocin ointment (BACTROBAN) 2 % Apply 1 application topically 2 (two) times daily. Refills: 3    nitroGLYCERIN (NITROSTAT) 0.4 MG SL tablet Place 0.4 mg under the tongue every 5 (five) minutes as needed for chest pain.    omeprazole (PRILOSEC) 20 MG capsule TAKE 1 CAPSULE BY MOUTH ONCE DAILY Qty: 60 capsule, Refills: 10    pravastatin (PRAVACHOL) 40 MG tablet Take 1 tablet (40 mg total) by mouth at bedtime. Qty: 90 tablet, Refills: 3    SANTYL ointment Apply 1 application topically every other day. Refills: 2    traZODone (DESYREL) 50 MG tablet Take 1 tablet by mouth at bedtime. Refills: 1    vitamin B-12 (CYANOCOBALAMIN) 1000 MCG tablet Take 1,000 mcg by mouth daily.    ONE TOUCH ULTRA TEST test strip CHECK SUGAR AS DIRECTED 3 TIMES DAILY Refills: 5  Procedures/Studies: Dg Chest 2 View  Result Date: 07/16/2017 CLINICAL DATA:  Multiple recent falls.  Pitting edema. EXAM: CHEST  2 VIEW COMPARISON:  Chest radiograph May 03, 2017 FINDINGS: Cardiac silhouette is mildly enlarged. Mediastinal silhouette is nonsuspicious. Calcified aortic arch. Status post CABG. Single lead LEFT cardiac defibrillator in situ. Low lung volumes with crowded vascular markings. Bibasilar strandy densities. Persistently elevated RIGHT hemidiaphragm. No pleural effusion or focal consolidation. Soft tissue planes and included osseous structures are  nonsuspicious. Old LEFT rib fractures. IMPRESSION: Mild cardiomegaly.  Bibasilar atelectasis. Electronically Signed   By: Elon Alas M.D.   On: 07/16/2017 21:14   Ct Head Wo Contrast  Result Date: 07/16/2017 CLINICAL DATA:  Multiple falls over the past 2 days when using his walker. EXAM: CT HEAD WITHOUT CONTRAST TECHNIQUE: Contiguous axial images were obtained from the base of the skull through the vertex without intravenous contrast. COMPARISON:  Head CT 06/28/2015 FINDINGS: Brain: Generalized atrophy, normal for age. Mild chronic small vessel ischemia. Remote lacunar infarct in the left basal ganglia, unchanged. No intracranial hemorrhage, mass effect, or midline shift. No hydrocephalus. The basilar cisterns are patent. No evidence of territorial infarct or acute ischemia. No extra-axial or intracranial fluid collection. Vascular: Atherosclerosis of skullbase vasculature without hyperdense vessel or abnormal calcification. Skull: No skull fracture or focal lesion. Sinuses/Orbits: Paranasal sinuses and mastoid air cells are clear. The visualized orbits are unremarkable. Bilateral cataract resection. Other: None. IMPRESSION: 1. No acute intracranial abnormality. 2. Stable atrophy and chronic small vessel ischemia. Remote lacunar infarct in left basal ganglia. Electronically Signed   By: Jeb Levering M.D.   On: 07/16/2017 22:05   US Renal  Result Date: 07/17/2017 CLINICAL DATA:  Acute kidney injury. EXAM: RENAL / URINARY TRACT ULTRASOUND COMPLETE COMPARISON:  None. FINDINGS: Right Kidney: Length: 10.2 cm. Echogenicity within normal limits. No mass or hydronephrosis visualized. Left Kidney: Length: 10.4 cm. Echogenicity within normal limits. No mass or hydronephrosis visualized. Bladder: Appears normal for degree of bladder distention. Other: Enlarged spleen measuring 14.2 cm in length, 13.2 cm in width and 8.0 cm in AP diameter with a calculated volume of 750 cc. IMPRESSION: 1. Normal appearing  kidneys with no hydronephrosis. 2. Splenomegaly. Electronically Signed   By: Claudie Revering M.D.   On: 07/17/2017 11:45      Subjective: Pt says he feels a lot better and ready to go home.    Discharge Exam: Vitals:   07/19/17 1951 07/20/17 0533  BP: (!) 124/40 132/60  Pulse: (!) 57 60  Resp: 18 18  Temp: 98 F (36.7 C) 98 F (36.7 C)  SpO2: 96% 96%   Vitals:   07/19/17 0800 07/19/17 1122 07/19/17 1951 07/20/17 0533  BP: (!) 141/52 (!) 136/52 (!) 124/40 132/60  Pulse: 70 75 (!) 57 60  Resp: 18 18 18 18   Temp: 98.1 F (36.7 C) 97.8 F (36.6 C) 98 F (36.7 C) 98 F (36.7 C)  TempSrc: Oral Oral Oral Oral  SpO2: 100% 100% 96% 96%  Weight:    69.3 kg (152 lb 12.8 oz)  Height:       General: Pt is alert, awake, not in acute distress Cardiovascular: RRR, S1/S2 +, no rubs, no gallops Respiratory: CTA bilaterally, no wheezing, no rhonchi Abdominal: Soft, NT, ND, bowel sounds + Extremities: no edema, no cyanosis   The results of significant diagnostics from this hospitalization (including imaging, microbiology, ancillary and laboratory) are listed below for reference.     Microbiology: No results found for  this or any previous visit (from the past 240 hour(s)).   Labs: BNP (last 3 results)  Recent Labs  07/16/17 2041  BNP 4,580.9*   Basic Metabolic Panel:  Recent Labs Lab 07/16/17 2041 07/17/17 0040 07/18/17 0503 07/19/17 0509 07/20/17 1107  NA 126* 130* 137 140 138  K 4.9 4.6 3.2* 3.2* 4.5  CL 87* 90* 94* 94* 97*  CO2 28 32 34* 37* 33*  GLUCOSE 102* 106* 139* 96 187*  BUN 45* 46* 43* 38* 26*  CREATININE 2.37* 2.32* 1.55* 1.18 1.06  CALCIUM 8.3* 8.4* 8.4* 8.8* 9.1  MG  --   --  2.0 1.8  --    Liver Function Tests: No results for input(s): AST, ALT, ALKPHOS, BILITOT, PROT, ALBUMIN in the last 168 hours. No results for input(s): LIPASE, AMYLASE in the last 168 hours. No results for input(s): AMMONIA in the last 168 hours. CBC:  Recent Labs Lab  07/16/17 2041 07/17/17 0040 07/18/17 0503 07/18/17 1649 07/19/17 0509  WBC 8.4 7.1 5.2  --  5.6  HGB 7.9* 7.8* 7.4* 8.4* 8.7*  HCT 25.6* 25.5* 24.2* 27.9* 28.7*  MCV 77.1* 77.0* 77.3*  --  79.3  PLT 135* 152 122*  --  144*   Cardiac Enzymes: No results for input(s): CKTOTAL, CKMB, CKMBINDEX, TROPONINI in the last 168 hours. BNP: Invalid input(s): POCBNP CBG:  Recent Labs Lab 07/19/17 1131 07/19/17 1639 07/19/17 2053 07/20/17 0735 07/20/17 1132  GLUCAP 242* 189* 273* 99 197*   D-Dimer No results for input(s): DDIMER in the last 72 hours. Hgb A1c  Recent Labs  07/19/17 0509  HGBA1C 7.1*   Lipid Profile No results for input(s): CHOL, HDL, LDLCALC, TRIG, CHOLHDL, LDLDIRECT in the last 72 hours. Thyroid function studies No results for input(s): TSH, T4TOTAL, T3FREE, THYROIDAB in the last 72 hours.  Invalid input(s): FREET3 Anemia work up No results for input(s): VITAMINB12, FOLATE, FERRITIN, TIBC, IRON, RETICCTPCT in the last 72 hours. Urinalysis    Component Value Date/Time   COLORURINE YELLOW 07/17/2017 0547   APPEARANCEUR CLEAR 07/17/2017 0547   LABSPEC 1.008 07/17/2017 0547   PHURINE 5.0 07/17/2017 0547   GLUCOSEU NEGATIVE 07/17/2017 0547   HGBUR NEGATIVE 07/17/2017 0547   BILIRUBINUR NEGATIVE 07/17/2017 0547   KETONESUR NEGATIVE 07/17/2017 0547   PROTEINUR NEGATIVE 07/17/2017 0547   NITRITE NEGATIVE 07/17/2017 0547   LEUKOCYTESUR SMALL (A) 07/17/2017 0547   Sepsis Labs Invalid input(s): PROCALCITONIN,  WBC,  LACTICIDVEN Microbiology No results found for this or any previous visit (from the past 240 hour(s)).  Time coordinating discharge: 38 mins   SIGNED:  Irwin Brakeman, MD  [facility] 07/20/2017, 12:10 PM Pager [phone]  If 7PM-7AM, please contact night-coverage [email or web address] Password [email or web address]

## 2017-07-20 NOTE — Discharge Instructions (Signed)
Follow with Primary MD  Cyndi Bender, PA-C  and other consultant's as instructed your Hospitalist MD  Please get a complete blood count and chemistry panel checked by your Primary MD at your next visit, and again as instructed by your Primary MD.  Get Medicines reviewed and adjusted: Please take all your medications with you for your next visit with your Primary MD  Laboratory/radiological data: Please request your Primary MD to go over all hospital tests and procedure/radiological results at the follow up, please ask your Primary MD to get all Hospital records sent to his/her office.  In some cases, they will be blood work, cultures and biopsy results pending at the time of your discharge. Please request that your primary care M.D. follows up on these results.  Also Note the following: If you experience worsening of your admission symptoms, develop shortness of breath, life threatening emergency, suicidal or homicidal thoughts you must seek medical attention immediately by calling 911 or calling your MD immediately  if symptoms less severe.  You must read complete instructions/literature along with all the possible adverse reactions/side effects for all the Medicines you take and that have been prescribed to you. Take any new Medicines after you have completely understood and accpet all the possible adverse reactions/side effects.   Do not drive when taking Pain medications or sleeping medications (Benzodaizepines)  Do not take more than prescribed Pain, Sleep and Anxiety Medications. It is not advisable to combine anxiety,sleep and pain medications without talking with your primary care practitioner  Special Instructions: If you have smoked or chewed Tobacco  in the last 2 yrs please stop smoking, stop any regular Alcohol  and or any Recreational drug use.  Wear Seat belts while driving.  Please note: You were cared for by a hospitalist during your hospital stay. Once you are  discharged, your primary care physician will handle any further medical issues. Please note that NO REFILLS for any discharge medications will be authorized once you are discharged, as it is imperative that you return to your primary care physician (or establish a relationship with a primary care physician if you do not have one) for your post hospital discharge needs so that they can reassess your need for medications and monitor your lab values.

## 2017-07-20 NOTE — Progress Notes (Signed)
Patient is alert and oriented has expressed the need for sleep, Called MD for an addition sleep aid, Vital stable, expecting to go home tomorrow.

## 2017-07-20 NOTE — Progress Notes (Signed)
Progress Note  Patient Name: Victor [name] Date of Encounter: 07/20/2017  Primary Cardiologist: Dr. Martinique   Subjective   Not feeling well due to "hot/warm room". Turned down tempeture. No chest pain, SOB or palpitations.   Inpatient Medications    Scheduled Meds: . aspirin EC  81 mg Oral Daily  . bisoprolol  2.5 mg Oral Daily  . DULoxetine  60 mg Oral Daily  . feeding supplement (ENSURE ENLIVE)  237 mL Oral BID BM  . furosemide  40 mg Oral BID  . insulin aspart  0-5 Units Subcutaneous QHS  . insulin aspart  0-9 Units Subcutaneous TID WC  . insulin detemir  20 Units Subcutaneous Daily  . losartan  25 mg Oral Daily  . potassium chloride  40 mEq Oral Daily  . pravastatin  40 mg Oral q1800  . pregabalin  150 mg Oral Daily  . sodium chloride flush  3 mL Intravenous Q12H  . traZODone  50-100 mg Oral QHS  . vitamin B-12  1,000 mcg Oral Daily   Continuous Infusions:  PRN Meds: acetaminophen **OR** acetaminophen, bisacodyl, nitroGLYCERIN, ondansetron **OR** ondansetron (ZOFRAN) IV, senna-docusate   Vital Signs    Vitals:   07/19/17 0800 07/19/17 1122 07/19/17 1951 07/20/17 0533  BP: (!) 141/52 (!) 136/52 (!) 124/40 132/60  Pulse: 70 75 (!) 57 60  Resp: 18 18 18 18   Temp: 98.1 F (36.7 C) 97.8 F (36.6 C) 98 F (36.7 C) 98 F (36.7 C)  TempSrc: Oral Oral Oral Oral  SpO2: 100% 100% 96% 96%  Weight:    152 lb 12.8 oz (69.3 kg)  Height:        Intake/Output Summary (Last 24 hours) at 07/20/17 1042 Last data filed at 07/19/17 1700  Gross per 24 hour  Intake              690 ml  Output              300 ml  Net              390 ml   Filed Weights   07/18/17 0629 07/19/17 0430 07/20/17 0533  Weight: 165 lb 3.2 oz (74.9 kg) 154 lb 6.4 oz (70 kg) 152 lb 12.8 oz (69.3 kg)    Telemetry    Sinus rhythm with frequent PACs and PVCs- Personally Reviewed  ECG    None  Physical Exam   GEN: No acute distress.   Neck: No JVD Cardiac: RRR, no murmurs, rubs, or  gallops.  Respiratory: Clear to auscultation bilaterally with course breath sound GI: Soft, nontender, non-distended  MS: No edema; No deformity. Neuro:  Nonfocal  Psych: Normal affect   Labs    Chemistry Recent Labs Lab 07/17/17 0040 07/18/17 0503 07/19/17 0509  NA 130* 137 140  K 4.6 3.2* 3.2*  CL 90* 94* 94*  CO2 32 34* 37*  GLUCOSE 106* 139* 96  BUN 46* 43* 38*  CREATININE 2.32* 1.55* 1.18  CALCIUM 8.4* 8.4* 8.8*  GFRNONAA 25* 41* 57*  GFRAA 29* 47* >60  ANIONGAP 8 9 9      Hematology Recent Labs Lab 07/17/17 0040 07/18/17 0503 07/18/17 1649 07/19/17 0509  WBC 7.1 5.2  --  5.6  RBC 3.31*  3.33* 3.13*  --  3.62*  HGB 7.8* 7.4* 8.4* 8.7*  HCT 25.5* 24.2* 27.9* 28.7*  MCV 77.0* 77.3*  --  79.3  MCH 23.6* 23.6*  --  24.0*  MCHC 30.6 30.6  --  30.3  RDW 15.7* 15.3  --  15.2  PLT 152 122*  --  144*    Cardiac EnzymesNo results for input(s): TROPONINI in the last 168 hours.  Recent Labs Lab 07/16/17 2045  TROPIPOC 0.01     BNP Recent Labs Lab 07/16/17 2041  BNP 1,436.0*     DDimer No results for input(s): DDIMER in the last 168 hours.   Radiology    No results found.  Cardiac Studies   Echo 07/18/17 Study Conclusions  - Left ventricle: The cavity size was normal. Wall thickness was increased in a pattern of mild LVH. Systolic function was moderately reduced. The estimated ejection fraction was in the range of 35% to 40%. There is akinesis of the basal-midinferior myocardium. - Mitral valve: Calcified annulus. There was mild regurgitation. - Left atrium: The atrium was severely dilated. - Right ventricle: Pacer wire or catheter noted in right ventricle. - Tricuspid valve: There was moderate regurgitation. - Pulmonary arteries: Systolic pressure was mildly to moderately increased. PA peak pressure: 49 mm Hg (S).  Impressions:  - Compared to the prior study, there has been no significant interval change.  Patient Profile       81 y.o.malewith history of ischemic cardiomyopathy (CAD with CABG 1990, PCI SVG-OM 3 2014, failed attempted PCI CTO-RCA 2015), LVEF 35%, history of VT status post ICD on background of diabetes mellitus, chronic kidney disease, hypertension, hyperlipidemia, carotid artery disease, PAD, iron deficiency anemia and chronic respiratory insufficiency on home oxygen.   Assessment & Plan    1. Acute on chronic systolic CHF - Uncertain dry weight. Net I & O negative 2.8L with weight loss of 13lb (165-->152). Euvolemic. 152lb wound be his dry weight.  - Continue lasix 40mg  BID, Bisoprolol 2.5mg  qd losartan 25mg  and Kdur 40mg  qd. OK to discharge later today pending BEMT. IF low K, add additional Kdur 40 vs 11meq at PM and recheck BMET in 1 week with follow up with PCP. Cardiology follow up 08/04/17.   2. Hypokalemia - Pending BMET. AS above.    3. CAD - he was taken PRN nitro prior to presentation due CP and SOB. Did not required here. Likely due to CHF. Ambulating without angina.  4. Anemia - FOBT negative. Per primary team  5. Hx of VT s/p ICD - On BB. Keep K > 4 and Mg 2. Pending BMET.  As above.   6. HTN - stable on current regimen.   Signed, Leanor Kail, PA  07/20/2017, 10:42 AM    I have seen and examined the patient along with Bhagat,Bhavinkumar, PA.  I have reviewed the chart, notes and new data.  I agree with PA/NP's note.  Key new complaints: even in/out on current PO diuretic dose. No dyspnea. Key examination changes: no JVD or edema, clear lungs, lying flat. Low DBP, but not dizzy when standing/walking. Key new findings / data: K 3.2, being replaced  PLAN: DC home on current diuretic dose, keep weight 150-152 lb. Bring back for labs and early appointment. Recommend HH PT and close supervision to avoid falls.  Sanda Klein, MD, [place] [phone] 07/20/2017, 12:46 PM

## 2017-07-21 LAB — BPAM RBC
BLOOD PRODUCT EXPIRATION DATE: 201809202359
BLOOD PRODUCT EXPIRATION DATE: 201809282359
ISSUE DATE / TIME: 201808281206
UNIT TYPE AND RH: 9500
UNIT TYPE AND RH: 9500

## 2017-07-21 LAB — TYPE AND SCREEN
ABO/RH(D): O NEG
ANTIBODY SCREEN: POSITIVE
DAT, IgG: NEGATIVE
DONOR AG TYPE: NEGATIVE
Donor AG Type: NEGATIVE
UNIT DIVISION: 0
UNIT DIVISION: 0

## 2017-07-21 NOTE — Progress Notes (Signed)
Received message from [place] with [facility] that patient is active with [facility] ; [facility] called and made aware of discharge home yesterday; resumption of orders faxed as requested, fax # [phone]; Mindi Slicker [facility] [phone]

## 2017-08-04 ENCOUNTER — Ambulatory Visit: Payer: Medicare Other | Admitting: Physician Assistant

## 2017-08-21 ENCOUNTER — Ambulatory Visit: Payer: Medicare Other | Admitting: Physician Assistant

## 2017-08-21 NOTE — Progress Notes (Deleted)
Cardiology Office Note    Date:  08/21/2017   ID:  Victor [name], DOB 01-22-1936, MRN [id number]  PCP:  Cyndi Bender, PA-C  Cardiologist:  Dr. Peter Martinique   Electrophysiologist:  Dr. Cristopher Peru    No chief complaint on file.   History of Present Illness:  Victor [name] is a 81 y.o. male with PMH of CKD stage III, HTN, HLD, PAD, carotid disease, DM, chronic combined systolic and diastolic heart failure, CAD s/p CABG 1990 with known occlusion of both vein grafts, PCI to SVG to OM3 in 2014 and attempted CTO PCI of RCA in 09/2014 and ICM with h/o VT in 2012 s/p BS ICD. He was admitted in 02/2015 with acute HF, there was new EKG changes. He underwent LHC with unsuccessful repeat attempt at PCI of CTO RCA. Medical therapy was continued. He was admitted in Aug 2016 with acute on chronic combined systolic and diastolic HF. He has a h/o orthostatic hypotension. Followup echo showed no significant change with EF 35-40%. His wife was killed in a MVA in 2017. He has tried Entresto in the past, but unable to tolerate due to hypotension.   He was admitted to [facility] in June 2018 with acute CHF. Echo showed EF 35%, akinesis of inferior and posterior wall, mild to moderate MR, pulmonary HTN with RV systolic pressure of 44 mmHg. He was diuresed with IV lasix. He had a fall and was admitted in June 2018 in [place]. He most recently admitted on 07/16/2017 for evaluation of multiple falls. Sodium was 126 on arrival. Cr 2.37, baseline 1.06. BNP was 1436, baseline >3000. Hemoglobin 7.9. Lasix, ARB/ACEI were held to avoid nephrotoxicity. He was seen by Dr. Sallyanne Kuster on 07/17/2017, lasix was restarted. Due to anemia, plavix was stopped. Repeat echo 07/18/2017 showed EF 35-40%, akinesis of basal-midinferior myocardium, severe LAE, mild MR, moderate TR, PA peak pressure 49 mmHg. His discharge weight was 152 lbs. He was discharged on lasix 40mg  BID.   No EKG Repeat BMET    Past Medical History:    Diagnosis Date  . Anemia   . Arthritis   . Automatic implantable cardioverter-defibrillator in situ   . Cardiomyopathy, ischemic    a. s/p ICD placement  . Chronic combined systolic and diastolic CHF (congestive heart failure) ([place])   . Chronic lower back pain   . CKD (chronic kidney disease), stage III   . Coronary artery disease    a. s/p CABG 1990. b. multiple PCIs since that time including CTO angioplasty (without stenting) PCI of RCA 09/2014, with last cath 02/2015 done for worsening CHF with unsuccessful attempt at PCI of the RCA due to inability to cross occlusion - medical therapy advised at that time.  Marland Kitchen GERD (gastroesophageal reflux disease)   . HLD (hyperlipidemia)   . Hypertension   . Occlusion and stenosis of carotid artery without mention of cerebral infarction   . Orthostatic hypotension   . Paroxysmal ventricular tachycardia ([place])   . Peripheral vascular disease ([place])   . Raynaud's syndrome   . Stroke [facility])    a. CT head 06/2015: Small old lacunar infarct LEFT basal ganglia.  . Thrombocytopenia ([place])   . TIA (transient ischemic attack)    a. s/p LHC on 10/01/14   . Type II diabetes mellitus ([place])     Past Surgical History:  Procedure Laterality Date  . ANAL FISTULECTOMY  2000's  . CARDIAC CATHETERIZATION  07/2014  . CARDIAC CATHETERIZATION  10/01/2014  Procedure: CORONARY BALLOON ANGIOPLASTY;  Surgeon: Peter M Martinique, MD;  Location: [facility] CATH LAB;  Service: Cardiovascular;;  . CARDIAC DEFIBRILLATOR PLACEMENT  02/1989-12/2011   "I've had a total of 6 or 7 defibrillators put in" (09/30/2014)  . CATARACT EXTRACTION, BILATERAL Bilateral ~ 2009  . CORONARY ANGIOPLASTY  10/01/14   PTVA to CTO with restoration of TIMI 3  . CORONARY ANGIOPLASTY WITH STENT PLACEMENT  04/2013   "1"  . CORONARY ARTERY BYPASS GRAFT  01/1989   "CABG X3"   . FINGER FRACTURE SURGERY Left ~ 2000   "pointer"  . FRACTURE SURGERY    . IMPLANTABLE CARDIOVERTER DEFIBRILLATOR (ICD) GENERATOR CHANGE  Left 10/05/2011   Procedure: ICD GENERATOR CHANGE;  Surgeon: Evans Lance, MD;  Location: [facility] CATH LAB;  Service: Cardiovascular;  Laterality: Left;  . LEFT AND RIGHT HEART CATHETERIZATION WITH CORONARY/GRAFT ANGIOGRAM N/A 04/26/2013   Procedure: LEFT AND RIGHT HEART CATHETERIZATION WITH Beatrix Fetters;  Surgeon: Sherren Mocha, MD;  Location: [facility] CATH LAB;  Service: Cardiovascular;  Laterality: N/A;  . LEFT HEART CATHETERIZATION WITH CORONARY ANGIOGRAM N/A 08/01/2014   Procedure: LEFT HEART CATHETERIZATION WITH CORONARY ANGIOGRAM;  Surgeon: Sinclair Grooms, MD;  Location: [facility] CATH LAB;  Service: Cardiovascular;  Laterality: N/A;  . LEFT HEART CATHETERIZATION WITH CORONARY ANGIOGRAM N/A 03/20/2015   Procedure: LEFT HEART CATHETERIZATION WITH CORONARY ANGIOGRAM;  Surgeon: Peter M Martinique, MD;  Location: [facility] CATH LAB;  Service: Cardiovascular;  Laterality: N/A;  . PERCUTANEOUS STENT INTERVENTION  04/26/2013   Procedure: PERCUTANEOUS STENT INTERVENTION;  Surgeon: Sherren Mocha, MD;  Location: [facility] CATH LAB;  Service: Cardiovascular;;  . RIGHT HEART CATHETERIZATION  10/01/2014   Procedure: RIGHT HEART CATH;  Surgeon: Peter M Martinique, MD;  Location: [facility] CATH LAB;  Service: Cardiovascular;;    Current Medications: Outpatient Medications Prior to Visit  Medication Sig Dispense Refill  . aspirin (ASPIR-81) 81 MG EC tablet Take 81 mg by mouth at bedtime.     . bisoprolol (ZEBETA) 5 MG tablet Take 0.5 tablets (2.5 mg total) by mouth daily. 90 tablet 3  . clopidogrel (PLAVIX) 75 MG tablet Take 1 tablet (75 mg total) by mouth daily. 90 tablet 3  . DULoxetine (CYMBALTA) 30 MG capsule Take 60 mg by mouth daily. .    . furosemide (LASIX) 40 MG tablet Take 1 tablet (40 mg total) by mouth 2 (two) times daily. 60 tablet 0  . LEVEMIR FLEXTOUCH 100 UNIT/ML Pen INJECT 30 UNITS UNDER SKIN EACH MORNING  1  . losartan (COZAAR) 50 MG tablet Take 0.5 tablets (25 mg total) by mouth daily. 90 tablet 3  . LYRICA 150 MG  capsule Take 150 mg by mouth daily.   1  . metFORMIN (GLUCOPHAGE) 1000 MG tablet Take 1,000 mg by mouth 2 (two) times daily with a meal.    . mupirocin ointment (BACTROBAN) 2 % Apply 1 application topically 2 (two) times daily.  3  . nitroGLYCERIN (NITROSTAT) 0.4 MG SL tablet Place 0.4 mg under the tongue every 5 (five) minutes as needed for chest pain.    Marland Kitchen omeprazole (PRILOSEC) 20 MG capsule TAKE 1 CAPSULE BY MOUTH ONCE DAILY 60 capsule 10  . ONE TOUCH ULTRA TEST test strip CHECK SUGAR AS DIRECTED 3 TIMES DAILY  5  . potassium chloride SA (K-DUR,KLOR-CON) 20 MEQ tablet Take 2 tablets (40 mEq total) by mouth daily. 60 tablet 0  . pravastatin (PRAVACHOL) 40 MG tablet Take 1 tablet (40 mg total) by mouth at bedtime. [place]  tablet 3  . SANTYL ointment Apply 1 application topically every other day.  2  . traZODone (DESYREL) 50 MG tablet Take 1 tablet by mouth at bedtime.  1  . vitamin B-12 (CYANOCOBALAMIN) 1000 MCG tablet Take 1,000 mcg by mouth daily.     No facility-administered medications prior to visit.      Allergies:   Coreg [carvedilol]; Lovastatin; and Ativan [lorazepam]   Social History   Social History  . Marital status: Married    Spouse name: N/A  . Number of children: N/A  . Years of education: N/A   Social History Main Topics  . Smoking status: Former Smoker    Years: 3.00    Types: Pipe  . Smokeless tobacco: Never Used     Comment: "quit smoking pipe in early 1970's"  . Alcohol use Yes     Comment: 09/30/2014 "wine w/dinner maybe once/yr"  . Drug use: No  . Sexual activity: No   Other Topics Concern  . 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      Family History:  The patient's ***family history includes Heart attack in his father; Heart failure (age of onset: 31) in his mother; Leukemia in his sister; Stomach  cancer in his maternal grandfather.   ROS:   Please see the history of present illness.    ROS All other systems reviewed and are negative.   PHYSICAL EXAM:   VS:  There were no vitals taken for this visit.   GEN: Well nourished, well developed, in no acute distress  HEENT: normal  Neck: no JVD, carotid bruits, or masses Cardiac: ***RRR; no murmurs, rubs, or gallops,no edema  Respiratory:  clear to auscultation bilaterally, normal work of breathing GI: soft, nontender, nondistended, + BS MS: no deformity or atrophy  Skin: warm and dry, no rash Neuro:  Alert and Oriented x 3, Strength and sensation are intact Psych: euthymic mood, full affect  Wt Readings from Last 3 Encounters:  07/20/17 152 lb 12.8 oz (69.3 kg)  05/23/17 157 lb (71.2 kg)  01/27/17 179 lb 9.6 oz (81.5 kg)      Studies/Labs Reviewed:   EKG:  EKG is*** ordered today.  The ekg ordered today demonstrates ***  Recent Labs: 07/16/2017: B Natriuretic Peptide 1,436.0 07/19/2017: Hemoglobin 8.7; Magnesium 1.8; Platelets 144 07/20/2017: BUN 26; Creatinine, Ser 1.06; Potassium 4.5; Sodium 138   Lipid Panel    Component Value Date/Time   CHOL 127 06/29/2015 0508   TRIG 111 06/29/2015 0508   HDL 36 (L) 06/29/2015 0508   CHOLHDL 3.5 06/29/2015 0508   VLDL 22 06/29/2015 0508   LDLCALC 69 06/29/2015 0508    Additional studies/ records that were reviewed today include:   Echo 07/18/2017 LV EF: 35% -   40%  Study Conclusions  - Left ventricle: The cavity size was normal. Wall thickness was   increased in a pattern of mild LVH. Systolic function was   moderately reduced. The estimated ejection fraction was in the   range of 35% to 40%. There is akinesis of the basal-midinferior   myocardium. - Mitral valve: Calcified annulus. There was mild regurgitation. - Left atrium: The atrium was severely dilated. - Right ventricle: Pacer wire or catheter noted in right ventricle. - Tricuspid valve: There was moderate  regurgitation. - Pulmonary arteries:  Systolic pressure was mildly to moderately   increased. PA peak pressure: 49 mm Hg (S).  Impressions:  - Compared to the prior study, there has been no significant   interval change.   ASSESSMENT:    No diagnosis found.   PLAN:  In order of problems listed above:  1. ***    Medication Adjustments/Labs and Tests Ordered: Current medicines are reviewed at length with the patient today.  Concerns regarding medicines are outlined above.  Medication changes, Labs and Tests ordered today are listed in the Patient Instructions below. There are no Patient Instructions on file for this visit.   Hilbert Corrigan, [place]  08/21/2017 7:29 AM    [address], [place], [place]  [address] Phone: [phone]; Fax: [phone]

## 2017-08-22 ENCOUNTER — Ambulatory Visit: Payer: Medicare Other | Admitting: Physician Assistant

## 2017-08-22 ENCOUNTER — Encounter: Payer: Self-pay | Admitting: *Deleted

## 2017-08-24 DIAGNOSIS — Z79899 Other long term (current) drug therapy: Secondary | ICD-10-CM | POA: Diagnosis not present

## 2017-08-24 DIAGNOSIS — D649 Anemia, unspecified: Secondary | ICD-10-CM | POA: Diagnosis not present

## 2017-08-24 DIAGNOSIS — J9611 Chronic respiratory failure with hypoxia: Secondary | ICD-10-CM | POA: Diagnosis not present

## 2017-08-24 DIAGNOSIS — I2510 Atherosclerotic heart disease of native coronary artery without angina pectoris: Secondary | ICD-10-CM | POA: Diagnosis not present

## 2017-08-24 DIAGNOSIS — Z6824 Body mass index (BMI) 24.0-24.9, adult: Secondary | ICD-10-CM | POA: Diagnosis not present

## 2017-08-24 DIAGNOSIS — E1149 Type 2 diabetes mellitus with other diabetic neurological complication: Secondary | ICD-10-CM | POA: Diagnosis not present

## 2017-08-24 DIAGNOSIS — N179 Acute kidney failure, unspecified: Secondary | ICD-10-CM | POA: Diagnosis not present

## 2017-08-24 DIAGNOSIS — E876 Hypokalemia: Secondary | ICD-10-CM | POA: Diagnosis not present

## 2017-08-24 DIAGNOSIS — M5136 Other intervertebral disc degeneration, lumbar region: Secondary | ICD-10-CM | POA: Diagnosis not present

## 2017-09-04 ENCOUNTER — Encounter: Payer: Medicare Other | Admitting: *Deleted

## 2017-09-04 NOTE — Progress Notes (Signed)
Cardiology Office Note   Date:  09/05/2017   ID:  TORREZ RENFROE, DOB 31-Mar-1936, MRN [id number]  PCP:  Cyndi Bender, PA-C  Cardiologist:  Dr. Peter Martinique   Electrophysiologist:  Dr. Cristopher Peru    Chief Complaint  Patient presents with  . Follow-up    6 weeks  . Headache  . Chest Pain    Preessure.     History of Present Illness: Victor [name] is a 81 y.o. male is seen for follow up CAD and CHF. He has a hx of CAD, status post CABG in 1990 with known occlusion of both vein grafts, PCI to the SVG-OM3 in 2014 and attempted CTO PCI of the RCA 09/2014. Chronic combined systolic and diastolic CHF, ischemic cardiomyopathy with history of VT in 2012, status post ICD, diabetes, CKD, HTN, HL, PAD, carotid stenosis.  Admitted 02/2015 with acute CHF exacerbation. He had new EKG changes and underwent LHC with unsuccessful repeat attempt at PCI of CTO RCA. Medical therapy was continued.  Admitted 8/7-06/30/15 with acute on chronic combined systolic and diastolic CHF. Patient has a history of orthostatic hypotension.   Follow-up echo demonstrated no significant change with an EF of 35-40%.   Seen by Kerin Ransom PA in November and December 2017 for titration of medications. 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 At one time they had 20 horses,  now down to 5.  The pt's wife was  killed in a MVA last year. He now lives alone but has family help. Attempted to get him on Entresto  09/23/16 but unable to tolerate due to hypotension. He also has chronic combined systolic + diastolic CHF with EF of 86% by echo Aug 2016. He had VT in 2012 and is S/p BS ICD followed by Dr Lovena Le.  When last seen his aldactone and lisinopril were held due to hypotension. Later started on low dose lisinopril.  He was  admitted to [facility] on 6/13-6/14/18 with CHF exacerbation. CXR showed interstitial edema. Pro BNP > 6000. Echo showed stable EF 35%- akinesis of the inferior and posterior wall,  mild to mod MR. Pulmonary HTN with estimated RV systolic pressure of 44 mm Hg. He was treated with IV lasix with improvement and discharged the following day on prior regimen. Hbg was 10.1.  He was then admitted to the hospital in [place] in June after falling at home. Hit his head hard on the floor. Had a scalp hematoma and abrasion left face and ear. Injured his arm and foot but no fracture. Reports CT of head, neck and spine were OK. Discharged on Sunday. Doesn't recall events prior to fall. Remembers getting up to go to [place]. Next thing he knew son found him on sofa with blood all around. In hospital his insulin was stopped. Other meds continued. He was transitioned to lasix 40 mg po bid. Losartan 25 mg daily resumed at DC. DC weight was 152 lbs.  He was admitted here from 8/26-8/30/18 for acute CHF exacerbation, multiple falls, hyponatremia, and ARF. Creatinine rose to 2.37. Hgb was down to 7.4. He was transfused 1 unit PRBC. Diuresed with IV lasix. Echo was unchanged. ACEi/ARB were held. He was transitioned to lasix 40 mg bid po and losartan 25 mg daily resumed at DC. Creatinine improved to 1.06. Hgb to 8.7. Sodium improved from 126>>138.  On follow up today he states he is now living with his grandson in [place]. His breathing is doing  well. He has gained 6-7 lbs since DC. Reports he loves eating hot dogs with relish and mustard- his favorite food. Denies any increase in edema. Appetite has improved. Energy level is much better and leg weakness improved. No falls. Compliant with medication.   Past Medical History:  Diagnosis Date  . Anemia   . Arthritis   . Automatic implantable cardioverter-defibrillator in situ   . Cardiomyopathy, ischemic    a. s/p ICD placement  . Chronic combined systolic and diastolic CHF (congestive heart failure) ([place])   . Chronic lower back pain   . CKD (chronic kidney disease), stage III ([place])   . Coronary artery disease    a. s/p CABG 1990. b. multiple PCIs  since that time including CTO angioplasty (without stenting) PCI of RCA 09/2014, with last cath 02/2015 done for worsening CHF with unsuccessful attempt at PCI of the RCA due to inability to cross occlusion - medical therapy advised at that time.  Marland Kitchen GERD (gastroesophageal reflux disease)   . HLD (hyperlipidemia)   . Hypertension   . Occlusion and stenosis of carotid artery without mention of cerebral infarction   . Orthostatic hypotension   . Paroxysmal ventricular tachycardia ([place])   . Peripheral vascular disease ([place])   . Raynaud's syndrome   . Stroke [facility])    a. CT head 06/2015: Small old lacunar infarct LEFT basal ganglia.  . Thrombocytopenia ([place])   . TIA (transient ischemic attack)    a. s/p LHC on 10/01/14   . Type II diabetes mellitus ([place])     Past Surgical History:  Procedure Laterality Date  . ANAL FISTULECTOMY  2000's  . CARDIAC CATHETERIZATION  07/2014  . CARDIAC CATHETERIZATION  10/01/2014   Procedure: CORONARY BALLOON ANGIOPLASTY;  Surgeon: Peter M Martinique, MD;  Location: [facility] CATH LAB;  Service: Cardiovascular;;  . CARDIAC DEFIBRILLATOR PLACEMENT  02/1989-12/2011   "I've had a total of 6 or 7 defibrillators put in" (09/30/2014)  . CATARACT EXTRACTION, BILATERAL Bilateral ~ 2009  . CORONARY ANGIOPLASTY  10/01/14   PTVA to CTO with restoration of TIMI 3  . CORONARY ANGIOPLASTY WITH STENT PLACEMENT  04/2013   "1"  . CORONARY ARTERY BYPASS GRAFT  01/1989   "CABG X3"   . FINGER FRACTURE SURGERY Left ~ 2000   "pointer"  . FRACTURE SURGERY    . IMPLANTABLE CARDIOVERTER DEFIBRILLATOR (ICD) GENERATOR CHANGE Left 10/05/2011   Procedure: ICD GENERATOR CHANGE;  Surgeon: Evans Lance, MD;  Location: [facility] CATH LAB;  Service: Cardiovascular;  Laterality: Left;  . LEFT AND RIGHT HEART CATHETERIZATION WITH CORONARY/GRAFT ANGIOGRAM N/A 04/26/2013   Procedure: LEFT AND RIGHT HEART CATHETERIZATION WITH Beatrix Fetters;  Surgeon: Sherren Mocha, MD;  Location: [facility] CATH LAB;  Service:  Cardiovascular;  Laterality: N/A;  . LEFT HEART CATHETERIZATION WITH CORONARY ANGIOGRAM N/A 08/01/2014   Procedure: LEFT HEART CATHETERIZATION WITH CORONARY ANGIOGRAM;  Surgeon: Sinclair Grooms, MD;  Location: [facility] CATH LAB;  Service: Cardiovascular;  Laterality: N/A;  . LEFT HEART CATHETERIZATION WITH CORONARY ANGIOGRAM N/A 03/20/2015   Procedure: LEFT HEART CATHETERIZATION WITH CORONARY ANGIOGRAM;  Surgeon: Peter M Martinique, MD;  Location: [facility] CATH LAB;  Service: Cardiovascular;  Laterality: N/A;  . PERCUTANEOUS STENT INTERVENTION  04/26/2013   Procedure: PERCUTANEOUS STENT INTERVENTION;  Surgeon: Sherren Mocha, MD;  Location: [facility] CATH LAB;  Service: Cardiovascular;;  . RIGHT HEART CATHETERIZATION  10/01/2014   Procedure: RIGHT HEART CATH;  Surgeon: Peter M Martinique, MD;  Location: [facility] CATH LAB;  Service: Cardiovascular;;  Current Outpatient Prescriptions  Medication Sig Dispense Refill  . aspirin (ASPIR-81) 81 MG EC tablet Take 81 mg by mouth at bedtime.     . bisoprolol (ZEBETA) 5 MG tablet Take 0.5 tablets (2.5 mg total) by mouth daily. 90 tablet 3  . clopidogrel (PLAVIX) 75 MG tablet Take 1 tablet (75 mg total) by mouth daily. 90 tablet 3  . DULoxetine (CYMBALTA) 30 MG capsule Take 60 mg by mouth daily. .    Ernest Mallick FLEXTOUCH 100 UNIT/ML Pen INJECT 30 UNITS UNDER SKIN EACH MORNING  1  . losartan (COZAAR) 50 MG tablet Take 0.5 tablets (25 mg total) by mouth daily. 90 tablet 3  . LYRICA 150 MG capsule Take 150 mg by mouth daily.   1  . mupirocin ointment (BACTROBAN) 2 % Apply 1 application topically 2 (two) times daily.  3  . nitroGLYCERIN (NITROSTAT) 0.4 MG SL tablet Place 0.4 mg under the tongue every 5 (five) minutes as needed for chest pain.    Marland Kitchen omeprazole (PRILOSEC) 20 MG capsule TAKE 1 CAPSULE BY MOUTH ONCE DAILY 60 capsule 10  . ONE TOUCH ULTRA TEST test strip CHECK SUGAR AS DIRECTED 3 TIMES DAILY  5  . SANTYL ointment Apply 1 application topically every other day.  2  . traZODone  (DESYREL) 50 MG tablet Take 1 tablet by mouth at bedtime.  1  . vitamin B-12 (CYANOCOBALAMIN) 1000 MCG tablet Take 1,000 mcg by mouth daily.    . furosemide (LASIX) 40 MG tablet Take 1 tablet (40 mg total) by mouth 2 (two) times daily. 60 tablet 0  . potassium chloride SA (K-DUR,KLOR-CON) 20 MEQ tablet Take 2 tablets (40 mEq total) by mouth daily. 60 tablet 0   No current facility-administered medications for this visit.     Allergies:   Coreg [carvedilol]; Lovastatin; and Ativan [lorazepam]    Social History:  The patient  reports that he has quit smoking. His smoking use included Pipe. He quit after 3.00 years of use. He has never used smokeless tobacco. He reports that he drinks alcohol. He reports that he does not use drugs.   Family History:  The patient's family history includes Heart attack in his father; Heart failure (age of onset: 55) in his mother; Leukemia in his sister; Stomach cancer in his maternal grandfather.    ROS:   Please see the history of present illness.   Review of Systems  Constitution: Positive for weight gain.  Cardiovascular: Positive for dyspnea on exertion.  Respiratory: Positive for shortness of breath. Negative for wheezing.   Musculoskeletal: Positive for neck pain. Negative for muscle weakness.  Gastrointestinal: Negative for bloating.  Neurological: Negative for loss of balance.  All other systems reviewed and are negative.    PHYSICAL EXAM: VS:  BP (!) 130/52   Pulse 72   Ht 5\' 8"  (1.727 m)   Wt 162 lb (73.5 kg)   BMI 24.63 kg/m     Wt Readings from Last 3 Encounters:  09/05/17 162 lb (73.5 kg)  07/20/17 152 lb 12.8 oz (69.3 kg)  05/23/17 157 lb (71.2 kg)     GEN: chronically ill appearing, in no acute distress  HEENT:  PERRL, EOMI, sclera are clear. Oropharynx is clear. NECK:  No jugular venous distention, carotid upstroke brisk and symmetric, no bruits, no thyromegaly or adenopathy LUNGS:  Clear to auscultation bilaterally CHEST:   Unremarkable HEART:  RRR,  PMI not displaced or sustained,S1 and S2 within normal limits, no S3, no  S4: no clicks, no rubs, no murmurs ABD:  Soft, nontender. BS +, no masses or bruits. No hepatomegaly, no splenomegaly EXT:  2 + pulses throughout, no edema, no cyanosis no clubbing SKIN:  Warm and dry.  No rashes NEURO:  Alert and oriented x 3. Cranial nerves II through XII intact. PSYCH:  Cognitively intact  EKG:  EKG is  ordered today. NSR with PVCs. First degree AV block. Old inferior infarct. I have personally reviewed and interpreted this study.   Recent Labs: 07/16/2017: B Natriuretic Peptide 1,436.0 07/19/2017: Hemoglobin 8.7; Magnesium 1.8; Platelets 144 07/20/2017: BUN 26; Creatinine, Ser 1.06; Potassium 4.5; Sodium 138  05/18/17: A1c 10.5. CMET normal.   Lipid Panel    Component Value Date/Time   CHOL 127 06/29/2015 0508   TRIG 111 06/29/2015 0508   HDL 36 (L) 06/29/2015 0508   CHOLHDL 3.5 06/29/2015 0508   VLDL 22 06/29/2015 0508   LDLCALC 69 06/29/2015 0508   Labs dated 05/12/16: cholesterol 127, triglycerides 166, HDL 48, LDL 46.  Dated 08/10/16: A1c 7.7%.   Echo: 07/18/17: Study Conclusions  - Left ventricle: The cavity size was normal. Wall thickness was   increased in a pattern of mild LVH. Systolic function was   moderately reduced. The estimated ejection fraction was in the   range of 35% to 40%. There is akinesis of the basal-midinferior   myocardium. - Mitral valve: Calcified annulus. There was mild regurgitation. - Left atrium: The atrium was severely dilated. - Right ventricle: Pacer wire or catheter noted in right ventricle. - Tricuspid valve: There was moderate regurgitation. - Pulmonary arteries: Systolic pressure was mildly to moderately   increased. PA peak pressure: 49 mm Hg (S).  Impressions:  - Compared to the prior study, there has been no significant   interval change.  ASSESSMENT AND PLAN:  1. Chronic combined systolic and diastolic CHF:   I am concerned about weight gain since hospital DC but he has no edema on exam and lungs are clear.  Will continue lasix at current dose. Continue losartan. Will check BMET and BNP today. Stressed importance of dietary compliance with sodium restriction. Will arrange follow up in 2 months.  2. Cardiomyopathy, ischemic:  Last EF 35-40% by echocardiogram. Continue beta blocker, ARB. Intolerant of Entresto due to hypotension.  3. Coronary artery disease involving native coronary artery of native heart without angina pectoris:   He had a  heart catheterization demonstrating severe 2 vessel CAD with chronic occlusion of the obtuse marginal and RCA with unsuccessful attempt at CTO  PCI of the RCA. Continue beta blocker, ASA, statin. He is not a candidate for further revascularization. I have recommended stopping Plavix with recent findings of significant anemia to minimize risk of bleeding.  4. Essential hypertension:  Controlled.  5. Orthostatic hypotension:  Continue  compression stockings to help with his orthostatic hypotension. Adjustment in meds as noted above.   6. Hyperlipidemia:    continue statin.  7. ICD- BS Nov 2012 for VT:   8. Type 2 diabetes mellitus with other circulatory complications:  Follow-up with primary care.  9. CKD (chronic kidney disease), class 3.  BMET today.   10. Back pain/cervical pain. He is a poor candidate for surgery with multiple co-morbidities and general poor health. Recommend conservative therapy.  11. Anemia secondary to chronic disease. Check CBC today.   12. Hyponatremia. Improved with diuresis. Check today.    Labs/ tests ordered today include:   Orders Placed This Encounter  Procedures  .  CBC w/Diff/Platelet  . Basic metabolic panel  . B Nat Peptide     Signed, Peter Martinique MD, [facility]    09/05/2017 2:41 PM

## 2017-09-05 ENCOUNTER — Encounter: Payer: Self-pay | Admitting: Cardiology

## 2017-09-05 ENCOUNTER — Ambulatory Visit (INDEPENDENT_AMBULATORY_CARE_PROVIDER_SITE_OTHER): Payer: Medicare Other | Admitting: Cardiology

## 2017-09-05 VITALS — BP 130/52 | HR 72 | Ht 68.0 in | Wt 162.0 lb

## 2017-09-05 DIAGNOSIS — I209 Angina pectoris, unspecified: Secondary | ICD-10-CM

## 2017-09-05 DIAGNOSIS — Z951 Presence of aortocoronary bypass graft: Secondary | ICD-10-CM

## 2017-09-05 DIAGNOSIS — D638 Anemia in other chronic diseases classified elsewhere: Secondary | ICD-10-CM | POA: Diagnosis not present

## 2017-09-05 DIAGNOSIS — I25118 Atherosclerotic heart disease of native coronary artery with other forms of angina pectoris: Secondary | ICD-10-CM | POA: Diagnosis not present

## 2017-09-05 DIAGNOSIS — I255 Ischemic cardiomyopathy: Secondary | ICD-10-CM | POA: Diagnosis not present

## 2017-09-05 DIAGNOSIS — I5022 Chronic systolic (congestive) heart failure: Secondary | ICD-10-CM

## 2017-09-05 DIAGNOSIS — N183 Chronic kidney disease, stage 3 unspecified: Secondary | ICD-10-CM

## 2017-09-05 NOTE — Patient Instructions (Addendum)
Stop Plavix  Continue your other therapy  We will check blood work today and call with the results.  You need to restrict your sodium intake-- NO hotdogs   DASH Eating Plan DASH stands for "Dietary Approaches to Stop Hypertension." The DASH eating plan is a healthy eating plan that has been shown to reduce high blood pressure (hypertension). It may also reduce your risk for type 2 diabetes, heart disease, and stroke. The DASH eating plan may also help with weight loss. What are tips for following this plan? General guidelines  Avoid eating more than 2,000 mg (milligrams) of salt (sodium) a day. If you have hypertension, you may need to reduce your sodium intake to 1,500 mg a day.  Limit alcohol intake to no more than 1 drink a day for nonpregnant women and 2 drinks a day for men. One drink equals 12 oz of beer, 5 oz of wine, or 1 oz of hard liquor.  Work with your health care provider to maintain a healthy body weight or to lose weight. Ask what an ideal weight is for you.  Get at least 30 minutes of exercise that causes your heart to beat faster (aerobic exercise) most days of the week. Activities may include walking, swimming, or biking.  Work with your health care provider or diet and nutrition specialist (dietitian) to adjust your eating plan to your individual calorie needs. Reading food labels  Check food labels for the amount of sodium per serving. Choose foods with less than 5 percent of the Daily Value of sodium. Generally, foods with less than 300 mg of sodium per serving fit into this eating plan.  To find whole grains, look for the word "whole" as the first word in the ingredient list. Shopping  Buy products labeled as "low-sodium" or "no salt added."  Buy fresh foods. Avoid canned foods and premade or frozen meals. Cooking  Avoid adding salt when cooking. Use salt-free seasonings or herbs instead of table salt or sea salt. Check with your health care provider or  pharmacist before using salt substitutes.  Do not fry foods. Cook foods using healthy methods such as baking, boiling, grilling, and broiling instead.  Cook with heart-healthy oils, such as olive, canola, soybean, or sunflower oil. Meal planning   Eat a balanced diet that includes: ? 5 or more servings of fruits and vegetables each day. At each meal, try to fill half of your plate with fruits and vegetables. ? Up to 6-8 servings of whole grains each day. ? Less than 6 oz of lean meat, poultry, or fish each day. A 3-oz serving of meat is about the same size as a deck of cards. One egg equals 1 oz. ? 2 servings of low-fat dairy each day. ? A serving of nuts, seeds, or beans 5 times each week. ? Heart-healthy fats. Healthy fats called Omega-3 fatty acids are found in foods such as flaxseeds and coldwater fish, like sardines, salmon, and mackerel.  Limit how much you eat of the following: ? Canned or prepackaged foods. ? Food that is high in trans fat, such as fried foods. ? Food that is high in saturated fat, such as fatty meat. ? Sweets, desserts, sugary drinks, and other foods with added sugar. ? Full-fat dairy products.  Do not salt foods before eating.  Try to eat at least 2 vegetarian meals each week.  Eat more home-cooked food and less restaurant, buffet, and fast food.  When eating at a restaurant, ask that  your food be prepared with less salt or no salt, if possible. What foods are recommended? The items listed may not be a complete list. Talk with your dietitian about what dietary choices are best for you. Grains Whole-grain or whole-wheat bread. Whole-grain or whole-wheat pasta. Brown rice. Modena Morrow. Bulgur. Whole-grain and low-sodium cereals. Pita bread. Low-fat, low-sodium crackers. Whole-wheat flour tortillas. Vegetables Fresh or frozen vegetables (raw, steamed, roasted, or grilled). Low-sodium or reduced-sodium tomato and vegetable juice. Low-sodium or  reduced-sodium tomato sauce and tomato paste. Low-sodium or reduced-sodium canned vegetables. Fruits All fresh, dried, or frozen fruit. Canned fruit in natural juice (without added sugar). Meat and other protein foods Skinless chicken or [place]. Ground chicken or [place]. Pork with fat trimmed off. Fish and seafood. Egg whites. Dried beans, peas, or lentils. Unsalted nuts, nut butters, and seeds. Unsalted canned beans. Lean cuts of beef with fat trimmed off. Low-sodium, lean deli meat. Dairy Low-fat (1%) or fat-free (skim) milk. Fat-free, low-fat, or reduced-fat cheeses. Nonfat, low-sodium ricotta or cottage cheese. Low-fat or nonfat yogurt. Low-fat, low-sodium cheese. Fats and oils Soft margarine without trans fats. Vegetable oil. Low-fat, reduced-fat, or light mayonnaise and salad dressings (reduced-sodium). Canola, safflower, olive, soybean, and sunflower oils. Avocado. Seasoning and other foods Herbs. Spices. Seasoning mixes without salt. Unsalted popcorn and pretzels. Fat-free sweets. What foods are not recommended? The items listed may not be a complete list. Talk with your dietitian about what dietary choices are best for you. Grains Baked goods made with fat, such as croissants, muffins, or some breads. Dry pasta or rice meal packs. Vegetables Creamed or fried vegetables. Vegetables in a cheese sauce. Regular canned vegetables (not low-sodium or reduced-sodium). Regular canned tomato sauce and paste (not low-sodium or reduced-sodium). Regular tomato and vegetable juice (not low-sodium or reduced-sodium). Angie Fava. Olives. Fruits Canned fruit in a light or heavy syrup. Fried fruit. Fruit in cream or butter sauce. Meat and other protein foods Fatty cuts of meat. Ribs. Fried meat. Berniece Salines. Sausage. Bologna and other processed lunch meats. Salami. Fatback. Hotdogs. Bratwurst. Salted nuts and seeds. Canned beans with added salt. Canned or smoked fish. Whole eggs or egg yolks. Chicken or [place]  with skin. Dairy Whole or 2% milk, cream, and half-and-half. Whole or full-fat cream cheese. Whole-fat or sweetened yogurt. Full-fat cheese. Nondairy creamers. Whipped toppings. Processed cheese and cheese spreads. Fats and oils Butter. Stick margarine. Lard. Shortening. Ghee. Bacon fat. Tropical oils, such as coconut, palm kernel, or palm oil. Seasoning and other foods Salted popcorn and pretzels. Onion salt, garlic salt, seasoned salt, table salt, and sea salt. Worcestershire sauce. Tartar sauce. Barbecue sauce. Teriyaki sauce. Soy sauce, including reduced-sodium. Steak sauce. Canned and packaged gravies. Fish sauce. Oyster sauce. Cocktail sauce. Horseradish that you find on the shelf. Ketchup. Mustard. Meat flavorings and tenderizers. Bouillon cubes. Hot sauce and Tabasco sauce. Premade or packaged marinades. Premade or packaged taco seasonings. Relishes. Regular salad dressings. Where to find more information:  National Heart, Lung, and [place]: https://wilson-eaton.com/  American Heart Association: www.heart.org Summary  The DASH eating plan is a healthy eating plan that has been shown to reduce high blood pressure (hypertension). It may also reduce your risk for type 2 diabetes, heart disease, and stroke.  With the DASH eating plan, you should limit salt (sodium) intake to 2,300 mg a day. If you have hypertension, you may need to reduce your sodium intake to 1,500 mg a day.  When on the DASH eating plan, aim to eat more fresh fruits and  vegetables, whole grains, lean proteins, low-fat dairy, and heart-healthy fats.  Work with your health care provider or diet and nutrition specialist (dietitian) to adjust your eating plan to your individual calorie needs. This information is not intended to replace advice given to you by your health care provider. Make sure you discuss any questions you have with your health care provider. Document Released: 10/27/2011 Document Revised: 10/31/2016  Document Reviewed: 10/31/2016 Elsevier Interactive Patient Education  2017 [employer].

## 2017-09-05 NOTE — Addendum Note (Signed)
Addended by: Kathyrn Lass on: 09/05/2017 03:01 PM   Modules accepted: Orders

## 2017-09-06 LAB — BASIC METABOLIC PANEL
BUN/Creatinine Ratio: 37 — ABNORMAL HIGH (ref 10–24)
BUN: 34 mg/dL — AB (ref 8–27)
CO2: 30 mmol/L — ABNORMAL HIGH (ref 20–29)
CREATININE: 0.92 mg/dL (ref 0.76–1.27)
Calcium: 9.5 mg/dL (ref 8.6–10.2)
Chloride: 93 mmol/L — ABNORMAL LOW (ref 96–106)
GFR, EST AFRICAN AMERICAN: 91 mL/min/{1.73_m2} (ref >59)
GFR, EST NON AFRICAN AMERICAN: 79 mL/min/{1.73_m2} (ref >59)
GLUCOSE: 405 mg/dL — AB (ref 65–99)
POTASSIUM: 4.8 mmol/L (ref 3.5–5.2)
Sodium: 139 mmol/L (ref 134–144)

## 2017-09-06 LAB — CBC WITH DIFFERENTIAL/PLATELET
BASOS ABS: 0 10*3/uL (ref 0.0–0.2)
BASOS: 0 %
EOS (ABSOLUTE): 0.1 10*3/uL (ref 0.0–0.4)
EOS: 1 %
HEMOGLOBIN: 11.6 g/dL — AB (ref 13.0–17.7)
Hematocrit: 38 % (ref 37.5–51.0)
IMMATURE GRANS (ABS): 0.2 10*3/uL — AB (ref 0.0–0.1)
Immature Granulocytes: 2 %
LYMPHS: 19 %
Lymphocytes Absolute: 2.4 10*3/uL (ref 0.7–3.1)
MCH: 25.3 pg — AB (ref 26.6–33.0)
MCHC: 30.5 g/dL — ABNORMAL LOW (ref 31.5–35.7)
MCV: 83 fL (ref 79–97)
MONOCYTES: 5 %
Monocytes Absolute: 0.6 10*3/uL (ref 0.1–0.9)
NEUTROS ABS: 9.4 10*3/uL — AB (ref 1.4–7.0)
Neutrophils: 73 %
Platelets: 226 10*3/uL (ref 150–379)
RBC: 4.58 x10E6/uL (ref 4.14–5.80)
RDW: 18.6 % — ABNORMAL HIGH (ref 12.3–15.4)
WBC: 12.8 10*3/uL — ABNORMAL HIGH (ref 3.4–10.8)

## 2017-09-06 LAB — BRAIN NATRIURETIC PEPTIDE: BNP: 1195.5 pg/mL — ABNORMAL HIGH (ref 0.0–100.0)

## 2017-09-07 ENCOUNTER — Telehealth: Payer: Self-pay | Admitting: Cardiology

## 2017-09-07 ENCOUNTER — Ambulatory Visit (INDEPENDENT_AMBULATORY_CARE_PROVIDER_SITE_OTHER): Payer: Medicare Other | Admitting: *Deleted

## 2017-09-07 DIAGNOSIS — I255 Ischemic cardiomyopathy: Secondary | ICD-10-CM

## 2017-09-07 NOTE — Telephone Encounter (Signed)
Resent as requested. Left message to call back if not received or readable

## 2017-09-07 NOTE — Telephone Encounter (Signed)
New message     Victor [name] is calling asking if pt labs can be refaxed she said she received them but they aren't readable. [phone]

## 2017-09-08 NOTE — Progress Notes (Signed)
Remote ICD transmission.   

## 2017-09-13 NOTE — Progress Notes (Signed)
Electrophysiology Office Note Date: 09/14/2017  ID:  Victor [name], DOB [birth year], MRN [id number]  PCP: Cyndi Bender, PA-C Primary Cardiologist: [place] Electrophysiologist: Lovena Le  CC: Routine ICD follow-up  Victor [name] is a 80 y.o. male seen today for Dr Lovena Le.  He presents today for routine electrophysiology followup.  Since last being seen in our clinic, the patient reports doing relatively well.  He is now living with his grandson which he does not like.  He states that his medications have been adjusted and he has had significantly less falls and shortness of breath.  He denies chest pain, palpitations, dyspnea, PND, orthopnea, nausea, vomiting, dizziness, syncope, edema, weight gain, or early satiety.  He has not had ICD shocks.   Device History: BSX abdominal ICD implanted 1997 for VT; abdominal system abandoned 2005 with single chamber transvenous system placed; gen change 2012 History of appropriate therapy: Yes History of AAD therapy: No   Past Medical History:  Diagnosis Date  . Anemia   . Arthritis   . Automatic implantable cardioverter-defibrillator in situ   . Cardiomyopathy, ischemic    a. s/p ICD placement  . Chronic combined systolic and diastolic CHF (congestive heart failure) ([place])   . Chronic lower back pain   . CKD (chronic kidney disease), stage III ([place])   . Coronary artery disease    a. s/p CABG 1990. b. multiple PCIs since that time including CTO angioplasty (without stenting) PCI of RCA 09/2014, with last cath 02/2015 done for worsening CHF with unsuccessful attempt at PCI of the RCA due to inability to cross occlusion - medical therapy advised at that time.  Marland Kitchen GERD (gastroesophageal reflux disease)   . HLD (hyperlipidemia)   . Hypertension   . Occlusion and stenosis of carotid artery without mention of cerebral infarction   . Orthostatic hypotension   . Paroxysmal ventricular tachycardia ([place])   . Peripheral vascular disease ([place])   .  Raynaud's syndrome   . Stroke [facility])    a. CT head 06/2015: Small old lacunar infarct LEFT basal ganglia.  . Thrombocytopenia ([place])   . TIA (transient ischemic attack)    a. s/p LHC on 10/01/14   . Type II diabetes mellitus ([place])    Past Surgical History:  Procedure Laterality Date  . ANAL FISTULECTOMY  2000's  . CARDIAC CATHETERIZATION  07/2014  . CARDIAC CATHETERIZATION  10/01/2014   Procedure: CORONARY BALLOON ANGIOPLASTY;  Surgeon: Peter M Martinique, MD;  Location: [facility] CATH LAB;  Service: Cardiovascular;;  . CARDIAC DEFIBRILLATOR PLACEMENT  02/1989-12/2011   "I've had a total of 6 or 7 defibrillators put in" (09/30/2014)  . CATARACT EXTRACTION, BILATERAL Bilateral ~ 2009  . CORONARY ANGIOPLASTY  10/01/14   PTVA to CTO with restoration of TIMI 3  . CORONARY ANGIOPLASTY WITH STENT PLACEMENT  04/2013   "1"  . CORONARY ARTERY BYPASS GRAFT  01/1989   "CABG X3"   . FINGER FRACTURE SURGERY Left ~ 2000   "pointer"  . FRACTURE SURGERY    . IMPLANTABLE CARDIOVERTER DEFIBRILLATOR (ICD) GENERATOR CHANGE Left 10/05/2011   Procedure: ICD GENERATOR CHANGE;  Surgeon: Evans Lance, MD;  Location: [facility] CATH LAB;  Service: Cardiovascular;  Laterality: Left;  . LEFT AND RIGHT HEART CATHETERIZATION WITH CORONARY/GRAFT ANGIOGRAM N/A 04/26/2013   Procedure: LEFT AND RIGHT HEART CATHETERIZATION WITH Beatrix Fetters;  Surgeon: Sherren Mocha, MD;  Location: [facility] CATH LAB;  Service: Cardiovascular;  Laterality: N/A;  . LEFT HEART CATHETERIZATION WITH CORONARY ANGIOGRAM N/A 08/01/2014  Procedure: LEFT HEART CATHETERIZATION WITH CORONARY ANGIOGRAM;  Surgeon: Sinclair Grooms, MD;  Location: [facility] CATH LAB;  Service: Cardiovascular;  Laterality: N/A;  . LEFT HEART CATHETERIZATION WITH CORONARY ANGIOGRAM N/A 03/20/2015   Procedure: LEFT HEART CATHETERIZATION WITH CORONARY ANGIOGRAM;  Surgeon: Peter M Martinique, MD;  Location[facility] CATH LAB;  Service: Cardiovascular;  Laterality: N/A;  . PERCUTANEOUS STENT INTERVENTION   04/26/2013   Procedure: PERCUTANEOUS STENT INTERVENTION;  Surgeon: Sherren Mocha, MD;  Location: [facility] CATH LAB;  Service: Cardiovascular;;  . RIGHT HEART CATHETERIZATION  10/01/2014   Procedure: RIGHT HEART CATH;  Surgeon: Peter M Martinique, MD;  Location: [facility] CATH LAB;  Service: Cardiovascular;;    Current Outpatient Prescriptions  Medication Sig Dispense Refill  . aspirin (ASPIR-81) 81 MG EC tablet Take 81 mg by mouth at bedtime.     . bisoprolol (ZEBETA) 5 MG tablet Take 0.5 tablets (2.5 mg total) by mouth daily. 90 tablet 3  . clopidogrel (PLAVIX) 75 MG tablet Take 1 tablet (75 mg total) by mouth daily. 90 tablet 3  . DULoxetine (CYMBALTA) 30 MG capsule Take 60 mg by mouth daily. .    . furosemide (LASIX) 40 MG tablet Take 1 tablet (40 mg total) by mouth 2 (two) times daily. 60 tablet 0  . LEVEMIR FLEXTOUCH 100 UNIT/ML Pen INJECT 30 UNITS UNDER SKIN EACH MORNING  1  . LYRICA 150 MG capsule Take 150 mg by mouth daily.   1  . mupirocin ointment (BACTROBAN) 2 % Apply 1 application topically 2 (two) times daily.  3  . nitroGLYCERIN (NITROSTAT) 0.4 MG SL tablet Place 0.4 mg under the tongue every 5 (five) minutes as needed for chest pain.    . ONE TOUCH ULTRA TEST test strip CHECK SUGAR AS DIRECTED 3 TIMES DAILY  5  . potassium chloride SA (K-DUR,KLOR-CON) 20 MEQ tablet Take 2 tablets (40 mEq total) by mouth daily. 60 tablet 0  . SANTYL ointment Apply 1 application topically every other day.  2  . traZODone (DESYREL) 50 MG tablet Take 1 tablet by mouth at bedtime.  1  . vitamin B-12 (CYANOCOBALAMIN) 1000 MCG tablet Take 1,000 mcg by mouth daily.    Marland Kitchen losartan (COZAAR) 50 MG tablet Take 0.5 tablets (25 mg total) by mouth daily. (Patient not taking: Reported on 09/14/2017) 90 tablet 3   No current facility-administered medications for this visit.     Allergies:   Coreg [carvedilol]; Lovastatin; and Ativan [lorazepam]   Social History: Social History   Social History  . Marital status: Married      Spouse name: N/A  . Number of children: N/A  . Years of education: N/A   Occupational History  . Not on file.   Social History Main Topics  . Smoking status: Former Smoker    Years: 3.00    Types: Pipe  . Smokeless tobacco: Never Used     Comment: "quit smoking pipe in early 1970's"  . Alcohol use Yes     Comment: 09/30/2014 "wine w/dinner maybe once/yr"  . Drug use: No  . Sexual activity: No   Other Topics Concern  . 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     Family History: Family History  Problem Relation Age of Onset  . Heart failure Mother 39  . Heart attack  Father   . Leukemia Sister   . Stomach cancer Maternal Grandfather     Review of Systems: All other systems reviewed and are otherwise negative except as noted above.   Physical Exam: VS:  BP 140/82   Pulse 66   Ht 5\' 6"  (1.676 m)   Wt 164 lb 3.2 oz (74.5 kg)   SpO2 97%   BMI 26.50 kg/m  , BMI Body mass index is 26.5 kg/m.  GEN- The patient is elderly appearing, alert and oriented x 3 today.   HEENT: normocephalic, atraumatic; sclera clear, conjunctiva pink; hearing intact; oropharynx clear; neck supple  Lungs- Clear to ausculation bilaterally, normal work of breathing.  No wheezes, rales, rhonchi Heart- Regular rate and rhythm  GI- soft, non-tender, non-distended, bowel sounds present  Extremities- no clubbing, cyanosis, or edema  MS- no significant deformity or atrophy Skin- warm and dry, no rash or lesion; ICD pocket well healed Psych- euthymic mood, full affect Neuro- strength and sensation are intact  ICD interrogation- reviewed in detail today,  See PACEART report  EKG:  EKG is not ordered today.  Recent Labs: 07/19/2017: Magnesium 1.8 09/05/2017: BNP 1,195.5; BUN 34; Creatinine, Ser 0.92; Hemoglobin 11.6; Platelets 226;  Potassium 4.8; Sodium 139   Wt Readings from Last 3 Encounters:  09/14/17 164 lb 3.2 oz (74.5 kg)  09/05/17 162 lb (73.5 kg)  07/20/17 152 lb 12.8 oz (69.3 kg)     Other studies Reviewed: Additional studies/ records that were reviewed today include: Dr Lovena Le and Dr Doug Sou office notes   Assessment and Plan:  1.  Chronic systolic dysfunction euvolemic today Stable on an appropriate medical regimen Normal ICD function See Pace Art report No changes today  2.  VT No recent recurrence  3.  ICM No recent ischemic symptoms Continue current therapy   Current medicines are reviewed at length with the patient today.   The patient does not have concerns regarding his medicines.  The following changes were made today:  none  Labs/ tests ordered today include: none Orders Placed This Encounter  Procedures  . CUP PACEART INCLINIC DEVICE CHECK     Disposition:   Follow up with [facility], Dr Lovena Le 1 year, Dr Martinique as scheduled     Signed, Chanetta Marshall, NP 09/14/2017 10:15 AM  [place] [address] [phone] (office) [phone] (fax)

## 2017-09-14 ENCOUNTER — Encounter: Payer: Self-pay | Admitting: Nurse Practitioner

## 2017-09-14 ENCOUNTER — Ambulatory Visit (INDEPENDENT_AMBULATORY_CARE_PROVIDER_SITE_OTHER): Payer: Medicare Other | Admitting: Nurse Practitioner

## 2017-09-14 ENCOUNTER — Encounter: Payer: Self-pay | Admitting: Cardiology

## 2017-09-14 VITALS — BP 140/82 | HR 66 | Ht 66.0 in | Wt 164.2 lb

## 2017-09-14 DIAGNOSIS — I2589 Other forms of chronic ischemic heart disease: Secondary | ICD-10-CM

## 2017-09-14 DIAGNOSIS — I472 Ventricular tachycardia: Secondary | ICD-10-CM | POA: Diagnosis not present

## 2017-09-14 DIAGNOSIS — I255 Ischemic cardiomyopathy: Secondary | ICD-10-CM | POA: Diagnosis not present

## 2017-09-14 DIAGNOSIS — I5022 Chronic systolic (congestive) heart failure: Secondary | ICD-10-CM | POA: Diagnosis not present

## 2017-09-14 DIAGNOSIS — I4729 Other ventricular tachycardia: Secondary | ICD-10-CM

## 2017-09-14 LAB — CUP PACEART INCLINIC DEVICE CHECK
Date Time Interrogation Session: 20181025100336
Implantable Lead Implant Date: 20050923
Implantable Lead Location: 753860
Implantable Lead Model: 158
Implantable Lead Serial Number: 154635
MDC IDC PG IMPLANT DT: 20121114
MDC IDC PG SERIAL: 118170

## 2017-09-14 NOTE — Patient Instructions (Addendum)
Medication Instructions:   Your physician recommends that you continue on your current medications as directed. Please refer to the Current Medication list given to you today.   If you need a refill on your cardiac medications before your next appointment, please call your pharmacy.  Labwork: NONE ORDERED  TODAY     Testing/Procedures: NONE ORDERED  TODAY    Follow-Up:  Your physician wants you to follow-up in: ONE YEAR WITH  TAYLOR You will receive a reminder letter in the mail two months in advance. If you don't receive a letter, please call our office to schedule the follow-up appointment.     Any Other Special Instructions Will Be Listed Below (If Applicable).                                                                                                                                                   

## 2017-09-28 LAB — CUP PACEART REMOTE DEVICE CHECK
Battery Remaining Longevity: 102 mo
Battery Remaining Percentage: 100 %
Brady Statistic RV Percent Paced: 0 %
Date Time Interrogation Session: 20181020105400
HighPow Impedance: 59 Ohm
Implantable Lead Implant Date: 20050923
Implantable Lead Location: 753860
Implantable Lead Model: 158
Implantable Lead Serial Number: 154635
Implantable Pulse Generator Implant Date: 20121114
Lead Channel Impedance Value: 547 Ohm
Lead Channel Pacing Threshold Amplitude: 0.9 V
Lead Channel Pacing Threshold Pulse Width: 0.4 ms
Lead Channel Setting Pacing Amplitude: 2.4 V
Lead Channel Setting Pacing Pulse Width: 0.4 ms
Lead Channel Setting Sensing Sensitivity: 0.4 mV
Pulse Gen Serial Number: 118170

## 2017-11-02 ENCOUNTER — Telehealth: Payer: Self-pay | Admitting: Cardiology

## 2017-11-02 NOTE — Telephone Encounter (Signed)
Message has been left with [name] that Dr. Doug Sou nurse is currently out of the office. Will route this message to her for her knowledge.

## 2017-11-02 NOTE — Telephone Encounter (Signed)
She wants to know if you received the order that she faxed over yesterday for his Oxygen?

## 2017-11-05 DIAGNOSIS — R1084 Generalized abdominal pain: Secondary | ICD-10-CM | POA: Diagnosis not present

## 2017-11-05 DIAGNOSIS — R778 Other specified abnormalities of plasma proteins: Secondary | ICD-10-CM | POA: Diagnosis not present

## 2017-11-05 DIAGNOSIS — I509 Heart failure, unspecified: Secondary | ICD-10-CM | POA: Diagnosis not present

## 2017-11-05 DIAGNOSIS — R918 Other nonspecific abnormal finding of lung field: Secondary | ICD-10-CM | POA: Diagnosis not present

## 2017-11-06 DIAGNOSIS — R7989 Other specified abnormal findings of blood chemistry: Secondary | ICD-10-CM | POA: Diagnosis present

## 2017-11-06 DIAGNOSIS — M549 Dorsalgia, unspecified: Secondary | ICD-10-CM | POA: Diagnosis present

## 2017-11-06 DIAGNOSIS — I951 Orthostatic hypotension: Secondary | ICD-10-CM | POA: Diagnosis present

## 2017-11-06 DIAGNOSIS — N183 Chronic kidney disease, stage 3 (moderate): Secondary | ICD-10-CM | POA: Diagnosis present

## 2017-11-06 DIAGNOSIS — I255 Ischemic cardiomyopathy: Secondary | ICD-10-CM | POA: Diagnosis present

## 2017-11-06 DIAGNOSIS — I509 Heart failure, unspecified: Secondary | ICD-10-CM | POA: Diagnosis not present

## 2017-11-06 DIAGNOSIS — F17210 Nicotine dependence, cigarettes, uncomplicated: Secondary | ICD-10-CM | POA: Diagnosis present

## 2017-11-06 DIAGNOSIS — J9610 Chronic respiratory failure, unspecified whether with hypoxia or hypercapnia: Secondary | ICD-10-CM | POA: Diagnosis not present

## 2017-11-06 DIAGNOSIS — E1122 Type 2 diabetes mellitus with diabetic chronic kidney disease: Secondary | ICD-10-CM | POA: Diagnosis present

## 2017-11-06 DIAGNOSIS — I5040 Unspecified combined systolic (congestive) and diastolic (congestive) heart failure: Secondary | ICD-10-CM | POA: Diagnosis not present

## 2017-11-06 DIAGNOSIS — I252 Old myocardial infarction: Secondary | ICD-10-CM | POA: Diagnosis not present

## 2017-11-06 DIAGNOSIS — E875 Hyperkalemia: Secondary | ICD-10-CM | POA: Diagnosis present

## 2017-11-06 DIAGNOSIS — E1140 Type 2 diabetes mellitus with diabetic neuropathy, unspecified: Secondary | ICD-10-CM | POA: Diagnosis not present

## 2017-11-06 DIAGNOSIS — Z7984 Long term (current) use of oral hypoglycemic drugs: Secondary | ICD-10-CM | POA: Diagnosis not present

## 2017-11-06 DIAGNOSIS — Z7982 Long term (current) use of aspirin: Secondary | ICD-10-CM | POA: Diagnosis not present

## 2017-11-06 DIAGNOSIS — I5043 Acute on chronic combined systolic (congestive) and diastolic (congestive) heart failure: Secondary | ICD-10-CM | POA: Diagnosis not present

## 2017-11-06 DIAGNOSIS — R748 Abnormal levels of other serum enzymes: Secondary | ICD-10-CM | POA: Diagnosis not present

## 2017-11-06 DIAGNOSIS — Z9181 History of falling: Secondary | ICD-10-CM | POA: Diagnosis not present

## 2017-11-06 DIAGNOSIS — Z7902 Long term (current) use of antithrombotics/antiplatelets: Secondary | ICD-10-CM | POA: Diagnosis not present

## 2017-11-06 DIAGNOSIS — Z794 Long term (current) use of insulin: Secondary | ICD-10-CM | POA: Diagnosis not present

## 2017-11-06 DIAGNOSIS — E785 Hyperlipidemia, unspecified: Secondary | ICD-10-CM | POA: Diagnosis present

## 2017-11-06 DIAGNOSIS — Z95810 Presence of automatic (implantable) cardiac defibrillator: Secondary | ICD-10-CM | POA: Diagnosis not present

## 2017-11-06 DIAGNOSIS — Z951 Presence of aortocoronary bypass graft: Secondary | ICD-10-CM | POA: Diagnosis not present

## 2017-11-06 DIAGNOSIS — I5023 Acute on chronic systolic (congestive) heart failure: Secondary | ICD-10-CM | POA: Diagnosis not present

## 2017-11-06 DIAGNOSIS — R778 Other specified abnormalities of plasma proteins: Secondary | ICD-10-CM | POA: Diagnosis not present

## 2017-11-06 DIAGNOSIS — R918 Other nonspecific abnormal finding of lung field: Secondary | ICD-10-CM | POA: Diagnosis not present

## 2017-11-06 DIAGNOSIS — E11649 Type 2 diabetes mellitus with hypoglycemia without coma: Secondary | ICD-10-CM | POA: Diagnosis not present

## 2017-11-06 DIAGNOSIS — Z79899 Other long term (current) drug therapy: Secondary | ICD-10-CM | POA: Diagnosis not present

## 2017-11-06 DIAGNOSIS — M542 Cervicalgia: Secondary | ICD-10-CM | POA: Diagnosis present

## 2017-11-06 DIAGNOSIS — I214 Non-ST elevation (NSTEMI) myocardial infarction: Secondary | ICD-10-CM | POA: Diagnosis not present

## 2017-11-06 DIAGNOSIS — R0602 Shortness of breath: Secondary | ICD-10-CM | POA: Diagnosis not present

## 2017-11-06 DIAGNOSIS — I2510 Atherosclerotic heart disease of native coronary artery without angina pectoris: Secondary | ICD-10-CM | POA: Diagnosis not present

## 2017-11-06 DIAGNOSIS — N182 Chronic kidney disease, stage 2 (mild): Secondary | ICD-10-CM | POA: Diagnosis not present

## 2017-11-06 NOTE — Telephone Encounter (Signed)
Left message for Victor [name], aware orders are here and are waiting for dr jordan's return 11-27-17 to sign.

## 2017-11-06 NOTE — Telephone Encounter (Signed)
Jelena ( [facility]) is calling to follow up on a oxygen prescription that needs to be signed by Dr. Martinique . Please call at [phone] and it is ok to leave a voicemail if need be . Thanks

## 2017-11-07 DIAGNOSIS — R748 Abnormal levels of other serum enzymes: Secondary | ICD-10-CM

## 2017-11-07 DIAGNOSIS — I5040 Unspecified combined systolic (congestive) and diastolic (congestive) heart failure: Secondary | ICD-10-CM

## 2017-11-07 DIAGNOSIS — N182 Chronic kidney disease, stage 2 (mild): Secondary | ICD-10-CM

## 2017-11-07 DIAGNOSIS — R0602 Shortness of breath: Secondary | ICD-10-CM

## 2017-11-08 ENCOUNTER — Other Ambulatory Visit: Payer: Self-pay

## 2017-11-08 ENCOUNTER — Encounter (HOSPITAL_COMMUNITY): Payer: Self-pay | Admitting: General Practice

## 2017-11-08 ENCOUNTER — Inpatient Hospital Stay (HOSPITAL_COMMUNITY)
Admission: AD | Admit: 2017-11-08 | Discharge: 2017-11-09 | DRG: 280 | Disposition: A | Discharge status: Home Health | Payer: Medicare Other | Source: Other Acute Inpatient Hospital | Attending: Internal Medicine | Admitting: Internal Medicine

## 2017-11-08 ENCOUNTER — Ambulatory Visit: Payer: Medicare Other | Admitting: Physician Assistant

## 2017-11-08 DIAGNOSIS — E1122 Type 2 diabetes mellitus with diabetic chronic kidney disease: Secondary | ICD-10-CM | POA: Diagnosis present

## 2017-11-08 DIAGNOSIS — E1151 Type 2 diabetes mellitus with diabetic peripheral angiopathy without gangrene: Secondary | ICD-10-CM | POA: Diagnosis present

## 2017-11-08 DIAGNOSIS — Z7984 Long term (current) use of oral hypoglycemic drugs: Secondary | ICD-10-CM | POA: Diagnosis not present

## 2017-11-08 DIAGNOSIS — K219 Gastro-esophageal reflux disease without esophagitis: Secondary | ICD-10-CM | POA: Diagnosis present

## 2017-11-08 DIAGNOSIS — Z87891 Personal history of nicotine dependence: Secondary | ICD-10-CM | POA: Diagnosis not present

## 2017-11-08 DIAGNOSIS — J9610 Chronic respiratory failure, unspecified whether with hypoxia or hypercapnia: Secondary | ICD-10-CM | POA: Diagnosis not present

## 2017-11-08 DIAGNOSIS — Z955 Presence of coronary angioplasty implant and graft: Secondary | ICD-10-CM

## 2017-11-08 DIAGNOSIS — R7989 Other specified abnormal findings of blood chemistry: Secondary | ICD-10-CM | POA: Diagnosis not present

## 2017-11-08 DIAGNOSIS — N183 Chronic kidney disease, stage 3 unspecified: Secondary | ICD-10-CM

## 2017-11-08 DIAGNOSIS — I739 Peripheral vascular disease, unspecified: Secondary | ICD-10-CM | POA: Diagnosis present

## 2017-11-08 DIAGNOSIS — I2510 Atherosclerotic heart disease of native coronary artery without angina pectoris: Secondary | ICD-10-CM | POA: Diagnosis present

## 2017-11-08 DIAGNOSIS — R748 Abnormal levels of other serum enzymes: Secondary | ICD-10-CM | POA: Diagnosis not present

## 2017-11-08 DIAGNOSIS — I7300 Raynaud's syndrome without gangrene: Secondary | ICD-10-CM | POA: Diagnosis present

## 2017-11-08 DIAGNOSIS — I5022 Chronic systolic (congestive) heart failure: Secondary | ICD-10-CM

## 2017-11-08 DIAGNOSIS — I248 Other forms of acute ischemic heart disease: Secondary | ICD-10-CM | POA: Diagnosis present

## 2017-11-08 DIAGNOSIS — Z8673 Personal history of transient ischemic attack (TIA), and cerebral infarction without residual deficits: Secondary | ICD-10-CM

## 2017-11-08 DIAGNOSIS — Z951 Presence of aortocoronary bypass graft: Secondary | ICD-10-CM | POA: Diagnosis not present

## 2017-11-08 DIAGNOSIS — Z95810 Presence of automatic (implantable) cardiac defibrillator: Secondary | ICD-10-CM

## 2017-11-08 DIAGNOSIS — I083 Combined rheumatic disorders of mitral, aortic and tricuspid valves: Secondary | ICD-10-CM | POA: Diagnosis present

## 2017-11-08 DIAGNOSIS — Z7982 Long term (current) use of aspirin: Secondary | ICD-10-CM

## 2017-11-08 DIAGNOSIS — E11649 Type 2 diabetes mellitus with hypoglycemia without coma: Secondary | ICD-10-CM | POA: Diagnosis not present

## 2017-11-08 DIAGNOSIS — I130 Hypertensive heart and chronic kidney disease with heart failure and stage 1 through stage 4 chronic kidney disease, or unspecified chronic kidney disease: Secondary | ICD-10-CM | POA: Diagnosis present

## 2017-11-08 DIAGNOSIS — I252 Old myocardial infarction: Secondary | ICD-10-CM | POA: Diagnosis not present

## 2017-11-08 DIAGNOSIS — Z888 Allergy status to other drugs, medicaments and biological substances status: Secondary | ICD-10-CM | POA: Diagnosis not present

## 2017-11-08 DIAGNOSIS — Z794 Long term (current) use of insulin: Secondary | ICD-10-CM

## 2017-11-08 DIAGNOSIS — I5023 Acute on chronic systolic (congestive) heart failure: Secondary | ICD-10-CM | POA: Diagnosis present

## 2017-11-08 DIAGNOSIS — Z7902 Long term (current) use of antithrombotics/antiplatelets: Secondary | ICD-10-CM

## 2017-11-08 DIAGNOSIS — I255 Ischemic cardiomyopathy: Secondary | ICD-10-CM | POA: Diagnosis present

## 2017-11-08 DIAGNOSIS — Z79899 Other long term (current) drug therapy: Secondary | ICD-10-CM

## 2017-11-08 DIAGNOSIS — I951 Orthostatic hypotension: Secondary | ICD-10-CM | POA: Diagnosis not present

## 2017-11-08 DIAGNOSIS — E1140 Type 2 diabetes mellitus with diabetic neuropathy, unspecified: Secondary | ICD-10-CM | POA: Diagnosis not present

## 2017-11-08 DIAGNOSIS — E875 Hyperkalemia: Secondary | ICD-10-CM | POA: Diagnosis not present

## 2017-11-08 DIAGNOSIS — R778 Other specified abnormalities of plasma proteins: Secondary | ICD-10-CM

## 2017-11-08 DIAGNOSIS — M542 Cervicalgia: Secondary | ICD-10-CM | POA: Diagnosis not present

## 2017-11-08 DIAGNOSIS — M549 Dorsalgia, unspecified: Secondary | ICD-10-CM | POA: Diagnosis not present

## 2017-11-08 DIAGNOSIS — I5043 Acute on chronic combined systolic (congestive) and diastolic (congestive) heart failure: Secondary | ICD-10-CM

## 2017-11-08 DIAGNOSIS — J439 Emphysema, unspecified: Secondary | ICD-10-CM | POA: Diagnosis present

## 2017-11-08 DIAGNOSIS — Z9181 History of falling: Secondary | ICD-10-CM | POA: Diagnosis not present

## 2017-11-08 DIAGNOSIS — Z23 Encounter for immunization: Secondary | ICD-10-CM | POA: Diagnosis not present

## 2017-11-08 DIAGNOSIS — I214 Non-ST elevation (NSTEMI) myocardial infarction: Secondary | ICD-10-CM | POA: Diagnosis not present

## 2017-11-08 DIAGNOSIS — E785 Hyperlipidemia, unspecified: Secondary | ICD-10-CM | POA: Diagnosis present

## 2017-11-08 DIAGNOSIS — F17210 Nicotine dependence, cigarettes, uncomplicated: Secondary | ICD-10-CM | POA: Diagnosis not present

## 2017-11-08 HISTORY — DX: Chronic obstructive pulmonary disease, unspecified: J44.9

## 2017-11-08 HISTORY — DX: Dependence on supplemental oxygen: Z99.81

## 2017-11-08 LAB — CBC WITH DIFFERENTIAL/PLATELET
Basophils Absolute: 0 10*3/uL (ref 0.0–0.1)
Basophils Relative: 0 %
Eosinophils Absolute: 0.2 10*3/uL (ref 0.0–0.7)
Eosinophils Relative: 2 %
HCT: 34.3 % — ABNORMAL LOW (ref 39.0–52.0)
Hemoglobin: 11 g/dL — ABNORMAL LOW (ref 13.0–17.0)
Lymphocytes Relative: 15 %
Lymphs Abs: 1.4 10*3/uL (ref 0.7–4.0)
MCH: 27.4 pg (ref 26.0–34.0)
MCHC: 32.1 g/dL (ref 30.0–36.0)
MCV: 85.3 fL (ref 78.0–100.0)
Monocytes Absolute: 0.7 10*3/uL (ref 0.1–1.0)
Monocytes Relative: 7 %
Neutro Abs: 7.5 10*3/uL (ref 1.7–7.7)
Neutrophils Relative %: 76 %
Platelets: 156 10*3/uL (ref 150–400)
RBC: 4.02 MIL/uL — ABNORMAL LOW (ref 4.22–5.81)
RDW: 16.9 % — ABNORMAL HIGH (ref 11.5–15.5)
WBC: 9.7 10*3/uL (ref 4.0–10.5)

## 2017-11-08 LAB — PHOSPHORUS: Phosphorus: 2.3 mg/dL — ABNORMAL LOW (ref 2.5–4.6)

## 2017-11-08 LAB — COMPREHENSIVE METABOLIC PANEL
ALT: 22 U/L (ref 17–63)
AST: 28 U/L (ref 15–41)
Albumin: 3.1 g/dL — ABNORMAL LOW (ref 3.5–5.0)
Alkaline Phosphatase: 95 U/L (ref 38–126)
Anion gap: 11 (ref 5–15)
BUN: 19 mg/dL (ref 6–20)
CO2: 32 mmol/L (ref 22–32)
Calcium: 8.5 mg/dL — ABNORMAL LOW (ref 8.9–10.3)
Chloride: 95 mmol/L — ABNORMAL LOW (ref 101–111)
Creatinine, Ser: 1.23 mg/dL (ref 0.61–1.24)
GFR calc Af Amer: >60 mL/min (ref >60)
GFR calc non Af Amer: 54 mL/min — ABNORMAL LOW (ref >60)
Glucose, Bld: 190 mg/dL — ABNORMAL HIGH (ref 65–99)
Potassium: 3.9 mmol/L (ref 3.5–5.1)
Sodium: 138 mmol/L (ref 135–145)
Total Bilirubin: 1.1 mg/dL (ref 0.3–1.2)
Total Protein: 7.1 g/dL (ref 6.5–8.1)

## 2017-11-08 LAB — GLUCOSE, CAPILLARY
Glucose-Capillary: 186 mg/dL — ABNORMAL HIGH (ref 65–99)
Glucose-Capillary: 181 mg/dL — ABNORMAL HIGH (ref 65–99)
Glucose-Capillary: 173 mg/dL — ABNORMAL HIGH (ref 65–99)
Glucose-Capillary: 149 mg/dL — ABNORMAL HIGH (ref 65–99)
Glucose-Capillary: 295 mg/dL — ABNORMAL HIGH (ref 65–99)

## 2017-11-08 LAB — BRAIN NATRIURETIC PEPTIDE: B Natriuretic Peptide: 796.6 pg/mL — ABNORMAL HIGH (ref 0.0–100.0)

## 2017-11-08 LAB — HEPARIN LEVEL (UNFRACTIONATED): Heparin Unfractionated: 0.12 IU/mL — ABNORMAL LOW (ref 0.30–0.70)

## 2017-11-08 LAB — MAGNESIUM: Magnesium: 1.8 mg/dL (ref 1.7–2.4)

## 2017-11-08 MED ORDER — DOCUSATE SODIUM 100 MG PO CAPS
100.0000 mg | ORAL_CAPSULE | Freq: Every day | ORAL | Status: DC
  Administered 2017-11-08: 100 mg via ORAL
  Filled 2017-11-08: qty 1

## 2017-11-08 MED ORDER — CLOPIDOGREL BISULFATE 75 MG PO TABS
75.0000 mg | ORAL_TABLET | Freq: Every day | ORAL | Status: DC
  Administered 2017-11-08 – 2017-11-09 (×2): 75 mg via ORAL
  Filled 2017-11-08 (×2): qty 1

## 2017-11-08 MED ORDER — BISOPROLOL FUMARATE 5 MG PO TABS
2.5000 mg | ORAL_TABLET | Freq: Every day | ORAL | Status: DC
  Administered 2017-11-08 – 2017-11-09 (×2): 2.5 mg via ORAL
  Filled 2017-11-08 (×2): qty 1

## 2017-11-08 MED ORDER — SENNOSIDES-DOCUSATE SODIUM 8.6-50 MG PO TABS
2.0000 | ORAL_TABLET | Freq: Every evening | ORAL | Status: DC | PRN
  Administered 2017-11-09: 2 via ORAL
  Filled 2017-11-08: qty 2

## 2017-11-08 MED ORDER — DULOXETINE HCL 60 MG PO CPEP
60.0000 mg | ORAL_CAPSULE | Freq: Every day | ORAL | Status: DC
  Administered 2017-11-08 – 2017-11-09 (×2): 60 mg via ORAL
  Filled 2017-11-08 (×2): qty 1

## 2017-11-08 MED ORDER — INSULIN ASPART 100 UNIT/ML ~~LOC~~ SOLN
4.0000 [IU] | Freq: Once | SUBCUTANEOUS | Status: AC
  Administered 2017-11-08: 4 [IU] via SUBCUTANEOUS

## 2017-11-08 MED ORDER — PREGABALIN 50 MG PO CAPS
150.0000 mg | ORAL_CAPSULE | Freq: Every day | ORAL | Status: DC
  Administered 2017-11-08 – 2017-11-09 (×2): 150 mg via ORAL
  Filled 2017-11-08 (×2): qty 1

## 2017-11-08 MED ORDER — POLYETHYLENE GLYCOL 3350 17 G PO PACK: 17.0000 g | PACK | Freq: Every day | ORAL | Status: DC | PRN

## 2017-11-08 MED ORDER — ASPIRIN EC 81 MG PO TBEC
81.0000 mg | DELAYED_RELEASE_TABLET | Freq: Every day | ORAL | Status: DC
  Administered 2017-11-08: 81 mg via ORAL
  Filled 2017-11-08: qty 1

## 2017-11-08 MED ORDER — POTASSIUM CHLORIDE CRYS ER 10 MEQ PO TBCR
10.0000 meq | EXTENDED_RELEASE_TABLET | Freq: Every day | ORAL | Status: DC
  Administered 2017-11-08: 10 meq via ORAL
  Filled 2017-11-08: qty 1

## 2017-11-08 MED ORDER — HYDROCODONE-ACETAMINOPHEN 5-325 MG PO TABS
1.0000 | ORAL_TABLET | Freq: Four times a day (QID) | ORAL | Status: DC | PRN
  Administered 2017-11-08 – 2017-11-09 (×2): 1 via ORAL
  Filled 2017-11-08 (×2): qty 1

## 2017-11-08 MED ORDER — INSULIN ASPART 100 UNIT/ML ~~LOC~~ SOLN
0.0000 [IU] | Freq: Three times a day (TID) | SUBCUTANEOUS | Status: DC
  Administered 2017-11-08: 1 [IU] via SUBCUTANEOUS
  Administered 2017-11-08: 2 [IU] via SUBCUTANEOUS
  Administered 2017-11-09: 3 [IU] via SUBCUTANEOUS
  Administered 2017-11-09: 1 [IU] via SUBCUTANEOUS

## 2017-11-08 MED ORDER — SPIRONOLACTONE 12.5 MG HALF TABLET
12.5000 mg | ORAL_TABLET | Freq: Every day | ORAL | Status: DC
  Administered 2017-11-08 – 2017-11-09 (×2): 12.5 mg via ORAL
  Filled 2017-11-08 (×3): qty 1

## 2017-11-08 MED ORDER — HEPARIN (PORCINE) IN NACL 100-0.45 UNIT/ML-% IJ SOLN
1250.0000 [IU]/h | INTRAMUSCULAR | Status: DC
  Administered 2017-11-08: 1100 [IU]/h via INTRAVENOUS
  Filled 2017-11-08: qty 250

## 2017-11-08 MED ORDER — FUROSEMIDE 10 MG/ML IJ SOLN
40.0000 mg | Freq: Once | INTRAMUSCULAR | Status: AC
  Administered 2017-11-08: 40 mg via INTRAVENOUS
  Filled 2017-11-08: qty 4

## 2017-11-08 MED ORDER — INSULIN DETEMIR 100 UNIT/ML ~~LOC~~ SOLN
30.0000 [IU] | Freq: Every day | SUBCUTANEOUS | Status: DC
  Administered 2017-11-08 – 2017-11-09 (×2): 30 [IU] via SUBCUTANEOUS
  Filled 2017-11-08 (×3): qty 0.3

## 2017-11-08 MED ORDER — PRAVASTATIN SODIUM 40 MG PO TABS
40.0000 mg | ORAL_TABLET | Freq: Every day | ORAL | Status: DC
  Administered 2017-11-08: 40 mg via ORAL
  Filled 2017-11-08: qty 1

## 2017-11-08 MED ORDER — TRAZODONE HCL 50 MG PO TABS
50.0000 mg | ORAL_TABLET | Freq: Every day | ORAL | Status: DC
  Administered 2017-11-08: 50 mg via ORAL
  Filled 2017-11-08: qty 1

## 2017-11-08 MED ORDER — VITAMIN B-12 1000 MCG PO TABS
1000.0000 ug | ORAL_TABLET | Freq: Every day | ORAL | Status: DC
  Administered 2017-11-08 – 2017-11-09 (×2): 1000 ug via ORAL
  Filled 2017-11-08 (×2): qty 1

## 2017-11-08 MED ORDER — ASPIRIN 81 MG PO TBEC: 81.0000 mg | DELAYED_RELEASE_TABLET | Freq: Every day | ORAL | Status: DC

## 2017-11-08 NOTE — Progress Notes (Signed)
ANTICOAGULATION CONSULT NOTE - Initial Consult  Pharmacy Consult for Heparin  Indication: chest pain/ACS  Allergies  Allergen Reactions  . Coreg [Carvedilol] Other (See Comments)    Shortness of breath ,fatigue ,dizzyness   . Lovastatin Other (See Comments)    CK elevation, Myalgias  . Ativan [Lorazepam] Other (See Comments)    Pt. had side effects    Patient Measurements: Height: 5\' 6"  (167.6 cm) Weight: 162 lb 6.4 oz (73.7 kg) IBW/kg (Calculated) : 63.8   Vital Signs: Temp: 98.4 F (36.9 C) (12/19 0900) Temp Source: Oral (12/19 0900) BP: 121/70 (12/19 0900)  Labs: Recent Labs    11/08/17 0928 11/08/17 1027  HGB 11.0*  --   HCT 34.3*  --   PLT 156  --   HEPARINUNFRC  --  0.12*  CREATININE 1.23  --     Estimated Creatinine Clearance: 43.9 mL/min (by C-G formula based on SCr of 1.23 mg/dL).   Medical History: Past Medical History:  Diagnosis Date  . Anemia   . Arthritis   . Automatic implantable cardioverter-defibrillator in situ   . Cardiomyopathy, ischemic    a. s/p ICD placement  . Chronic combined systolic and diastolic CHF (congestive heart failure) ([place])   . Chronic lower back pain   . CKD (chronic kidney disease), stage III ([place])   . Coronary artery disease    a. s/p CABG 1990. b. multiple PCIs since that time including CTO angioplasty (without stenting) PCI of RCA 09/2014, with last cath 02/2015 done for worsening CHF with unsuccessful attempt at PCI of the RCA due to inability to cross occlusion - medical therapy advised at that time.  Marland Kitchen GERD (gastroesophageal reflux disease)   . HLD (hyperlipidemia)   . Hypertension   . Occlusion and stenosis of carotid artery without mention of cerebral infarction   . Orthostatic hypotension   . Paroxysmal ventricular tachycardia ([place])   . Peripheral vascular disease ([place])   . Raynaud's syndrome   . Stroke [facility])    a. CT head 06/2015: Small old lacunar infarct LEFT basal ganglia.  . Thrombocytopenia ([place])   .  TIA (transient ischemic attack)    a. s/p LHC on 10/01/14   . Type II diabetes mellitus ([place])     Assessment: 81yom transferred from [facility] on heparin drip 1100 uts/hr for USAP.   HL 0.12 < goal.  No noted bleeding, h/h ok, pltc ok.  Goal of Therapy:  Heparin level 0.3-0.7 units/ml Monitor platelets by anticoagulation protocol: Yes   Plan:  Increase heparin drip 1250 uts/hr  Check HL in 6hr Adjust to goal Daily HL, CBC Monitor s/s bleeding   Bonnita Nasuti Pharm.D. CPP, BCPS Clinical Pharmacist [phone] 11/08/2017 12:41 PM

## 2017-11-08 NOTE — Progress Notes (Signed)
Patient is here from transfer with care links. Currently has heparin drip infusing at 11 ml/hr. Placed page to cardiologist at [phone] for continued orders and for orders to be placed. CHG bath given along with body audit verifed with [place]. Patient VSS. On 02 at 2.5L via nasal cannula. Urinal at bedside. Patient is alert and oriented x 3. Oriented to room and call bell. Bed alarm placed on. No acute distress is noted at this time. Will page cardiologist again. No call back yet.

## 2017-11-08 NOTE — H&P (Signed)
History and Physical  Victor [name] CHE:[id number] DOB: 1936-04-21 DOA: 11/08/2017  Referring physician: Transferred from [facility] PCP: Cyndi Bender, PA-C  Outpatient Specialists: Cardiology   Patient coming from: [facility]  Chief Complaint: Concerns for NSTEMI. Continues to report chest pain going to the Neck  HPI: 81 year old Caucasian male with past medical history significant for CHF, MI, HL, AICD, emphysema and S/P CABG. Patient was transferred from [facility]. Apparently, patient was admitted to [facility] with SOB, chest pain and elevated troponin. SOB had improved. Patient continues to have intermittent chest pain that is reported to go to the jaws. Patient was said to have cardiac cath about a year ago but angioplasty was not feasible. EF was 35-40% but now improved to 45-50%. Patient has been on heparin drip. The cardiology team at [facility] discussed with the cardiology team at [facility] and it was advised that the patient should be transferred to [facility]. No headache, no fever or chills, no GI symptoms and no urinary symptoms.   ED Course: Transferred from [facility]  Pertinent labs: Elevated troponin EKG: Independently reviewed.  Imaging: independently reviewed.   Review of Systems:  As in HPI. 10 systems reviewed. Negative for fever, visual changes, sore throat, rash, new muscle aches, dysuria, bleeding, n/v/abdominal pain.  Past Medical History:  Diagnosis Date  . Anemia   . Arthritis   . Automatic implantable cardioverter-defibrillator in situ   . Cardiomyopathy, ischemic    a. s/p ICD placement  . Chronic combined systolic and diastolic CHF (congestive heart failure) ([place])   . Chronic lower back pain   . CKD (chronic kidney disease), stage III ([place])   . Coronary artery disease    a. s/p CABG 1990. b. multiple PCIs since that time including CTO angioplasty (without stenting) PCI of RCA 09/2014, with  last cath 02/2015 done for worsening CHF with unsuccessful attempt at PCI of the RCA due to inability to cross occlusion - medical therapy advised at that time.  Marland Kitchen GERD (gastroesophageal reflux disease)   . HLD (hyperlipidemia)   . Hypertension   . Occlusion and stenosis of carotid artery without mention of cerebral infarction   . Orthostatic hypotension   . Paroxysmal ventricular tachycardia ([place])   . Peripheral vascular disease ([place])   . Raynaud's syndrome   . Stroke [facility])    a. CT head 06/2015: Small old lacunar infarct LEFT basal ganglia.  . Thrombocytopenia ([place])   . TIA (transient ischemic attack)    a. s/p LHC on 10/01/14   . Type II diabetes mellitus ([place])     Past Surgical History:  Procedure Laterality Date  . ANAL FISTULECTOMY  2000's  . CARDIAC CATHETERIZATION  07/2014  . CARDIAC CATHETERIZATION  10/01/2014   Procedure: CORONARY BALLOON ANGIOPLASTY;  Surgeon: Peter M Martinique, MD;  Location: [facility] CATH LAB;  Service: Cardiovascular;;  . CARDIAC DEFIBRILLATOR PLACEMENT  02/1989-12/2011   "I've had a total of 6 or 7 defibrillators put in" (09/30/2014)  . CATARACT EXTRACTION, BILATERAL Bilateral ~ 2009  . CORONARY ANGIOPLASTY  10/01/14   PTVA to CTO with restoration of TIMI 3  . CORONARY ANGIOPLASTY WITH STENT PLACEMENT  04/2013   "1"  . CORONARY ARTERY BYPASS GRAFT  01/1989   "CABG X3"   . FINGER FRACTURE SURGERY Left ~ 2000   "pointer"  . FRACTURE SURGERY    . IMPLANTABLE CARDIOVERTER DEFIBRILLATOR (ICD) GENERATOR CHANGE Left 10/05/2011   Procedure: ICD GENERATOR CHANGE;  Surgeon:  Evans Lance, MD;  Location: [facility] CATH LAB;  Service: Cardiovascular;  Laterality: Left;  . LEFT AND RIGHT HEART CATHETERIZATION WITH CORONARY/GRAFT ANGIOGRAM N/A 04/26/2013   Procedure: LEFT AND RIGHT HEART CATHETERIZATION WITH Beatrix Fetters;  Surgeon: Sherren Mocha, MD;  Location: [facility] CATH LAB;  Service: Cardiovascular;  Laterality: N/A;  . LEFT HEART CATHETERIZATION WITH CORONARY ANGIOGRAM  N/A 08/01/2014   Procedure: LEFT HEART CATHETERIZATION WITH CORONARY ANGIOGRAM;  Surgeon: Sinclair Grooms, MD;  Location: [facility] CATH LAB;  Service: Cardiovascular;  Laterality: N/A;  . LEFT HEART CATHETERIZATION WITH CORONARY ANGIOGRAM N/A 03/20/2015   Procedure: LEFT HEART CATHETERIZATION WITH CORONARY ANGIOGRAM;  Surgeon: Peter M Martinique, MD;  Location: [facility] CATH LAB;  Service: Cardiovascular;  Laterality: N/A;  . PERCUTANEOUS STENT INTERVENTION  04/26/2013   Procedure: PERCUTANEOUS STENT INTERVENTION;  Surgeon: Sherren Mocha, MD;  Location: [facility] CATH LAB;  Service: Cardiovascular;;  . RIGHT HEART CATHETERIZATION  10/01/2014   Procedure: RIGHT HEART CATH;  Surgeon: Peter M Martinique, MD;  Location: [facility] CATH LAB;  Service: Cardiovascular;;     reports that he has quit smoking. His smoking use included pipe. He quit after 3.00 years of use. he has never used smokeless tobacco. He reports that he drinks alcohol. He reports that he does not use drugs.  Allergies  Allergen Reactions  . Coreg [Carvedilol] Other (See Comments)    Shortness of breath ,fatigue ,dizzyness   . Lovastatin Other (See Comments)    CK elevation, Myalgias  . Ativan [Lorazepam] Other (See Comments)    Pt. had side effects    Family History  Problem Relation Age of Onset  . Heart failure Mother 74  . Heart attack Father   . Leukemia Sister   . Stomach cancer Maternal Grandfather      Prior to Admission medications   Medication Sig Start Date End Date Taking? Authorizing Provider  aspirin (ASPIR-81) 81 MG EC tablet Take 81 mg by mouth at bedtime.     [provider]  bisoprolol (ZEBETA) 5 MG tablet Take 0.5 tablets (2.5 mg total) by mouth daily. 01/27/17   Martinique, Peter M, MD  clopidogrel (PLAVIX) 75 MG tablet Take 1 tablet (75 mg total) by mouth daily. 01/27/17   Martinique, Peter M, MD  DULoxetine (CYMBALTA) 30 MG capsule Take 60 mg by mouth daily. .    [provider]  furosemide (LASIX) 40 MG tablet Take 1  tablet (40 mg total) by mouth 2 (two) times daily. 07/20/17 09/14/17  Johnson, Clanford L, MD  LEVEMIR FLEXTOUCH 100 UNIT/ML Pen INJECT 30 UNITS UNDER SKIN [facility] MORNING 04/15/16   [provider]  losartan (COZAAR) 50 MG tablet Take 0.5 tablets (25 mg total) by mouth daily. Patient not taking: Reported on 09/14/2017 05/23/17 05/13/19  Martinique, Peter M, MD  LYRICA 150 MG capsule Take 150 mg by mouth daily.  02/03/15   [provider]  mupirocin ointment (BACTROBAN) 2 % Apply 1 application topically 2 (two) times daily. 06/24/15   [provider]  nitroGLYCERIN (NITROSTAT) 0.4 MG SL tablet Place 0.4 mg under the tongue every 5 (five) minutes as needed for chest pain.    [provider]  ONE TOUCH ULTRA TEST test strip CHECK SUGAR AS DIRECTED 3 TIMES DAILY 05/24/15   [provider]  potassium chloride SA (K-DUR,KLOR-CON) 20 MEQ tablet Take 2 tablets (40 mEq total) by mouth daily. 07/21/17 09/14/17  Murlean Iba, MD  SANTYL ointment Apply 1 application topically every other  day. 03/04/15   [provider]  traZODone (DESYREL) 50 MG tablet Take 1 tablet by mouth at bedtime. 08/10/16   [provider]  vitamin B-12 (CYANOCOBALAMIN) 1000 MCG tablet Take 1,000 mcg by mouth daily.    [provider]    Physical Exam: Vitals:   11/08/17 0635  BP: (!) 158/73  Temp: 98 F (36.7 C)  TempSrc: Oral  Weight: 73.7 kg (162 lb 6.4 oz)  Height: 5\' 6"  (1.676 m)     Constitutional:  . Appears calm and comfortable. Obese Eyes:  . Pallor. No jaundice.  ENMT:  . external ears, nose appear normal Neck:  . Neck is supple. JVD is equivocal Respiratory:  . Decreased air entry globally Cardiovascular:  . S1S2, ESM . No LE extremity edema   Abdomen:  . Abdomen is soft and non tender. Organs are difficult to assess. Obese. Neurologic:  . Awake and alert. . Moves all limbs.  Wt Readings from Last 3 Encounters:  11/08/17 73.7 kg (162 lb  6.4 oz)  09/14/17 74.5 kg (164 lb 3.2 oz)  09/05/17 73.5 kg (162 lb)    I have personally reviewed following labs and imaging studies  Labs on Admission:  CBC: No results for input(s): WBC, NEUTROABS, HGB, HCT, MCV, PLT in the last 168 hours. Basic Metabolic Panel: No results for input(s): NA, K, CL, CO2, GLUCOSE, BUN, CREATININE, CALCIUM, MG, PHOS in the last 168 hours. Liver Function Tests: No results for input(s): AST, ALT, ALKPHOS, BILITOT, PROT, ALBUMIN in the last 168 hours. No results for input(s): LIPASE, AMYLASE in the last 168 hours. No results for input(s): AMMONIA in the last 168 hours. Coagulation Profile: No results for input(s): INR, PROTIME in the last 168 hours. Cardiac Enzymes: No results for input(s): CKTOTAL, CKMB, CKMBINDEX, TROPONINI in the last 168 hours. BNP (last 3 results) No results for input(s): PROBNP in the last 8760 hours. HbA1C: No results for input(s): HGBA1C in the last 72 hours. CBG: Recent Labs  Lab 11/08/17 0639  GLUCAP 186*   Lipid Profile: No results for input(s): CHOL, HDL, LDLCALC, TRIG, CHOLHDL, LDLDIRECT in the last 72 hours. Thyroid Function Tests: No results for input(s): TSH, T4TOTAL, FREET4, T3FREE, THYROIDAB in the last 72 hours. Anemia Panel: No results for input(s): VITAMINB12, FOLATE, FERRITIN, TIBC, IRON, RETICCTPCT in the last 72 hours. Urine analysis:    Component Value Date/Time   COLORURINE YELLOW 07/17/2017 [place] 07/17/2017 0547   LABSPEC 1.008 07/17/2017 0547   PHURINE 5.0 07/17/2017 0547   GLUCOSEU NEGATIVE 07/17/2017 0547   HGBUR NEGATIVE 07/17/2017 0547   BILIRUBINUR NEGATIVE 07/17/2017 0547   [place] 07/17/2017 0547   PROTEINUR NEGATIVE 07/17/2017 0547   NITRITE NEGATIVE 07/17/2017 0547   LEUKOCYTESUR SMALL (A) 07/17/2017 0547   Sepsis Labs: @LABRCNTIP (procalcitonin:4,lacticidven:4) )No results found for this or any previous visit (from the past 240 hour(s)).     Radiological Exams on Admission: No results found.  EKG: Independently reviewed.   Active Problems:   Acute on chronic systolic CHF (congestive heart failure) (HCC)   NSTEMI (non-ST elevated myocardial infarction) (HCC)   Elevated troponin   Assessment/Plan 1. NSTEMI 2. Acute on chronic systolic CHF 3. DM 4. Elevated troponin   Admit to telemetry  Continue Heparin drip  Continue treatment for NSTEMI  Cardiology already consulted for possible cardiac cath  Optimize blood sugar control  Monitor BP, renal function and electrolytes closely  DVT prophylaxis:On Heparin drip Code Status: Full Family Communication:  Disposition Plan: To be determined   Consults called: Cardiology already consulted   Admission status: Inpatient    Time spent: 63 minutes  Dana Allan, MD  [facility] Pager #: [phone] 7PM-7AM contact night coverage as above   11/08/2017, 9:26 AM

## 2017-11-08 NOTE — Consult Note (Signed)
Cardiology Consultation:   Patient ID: Victor [name]; [id number]; [birth year]   Admit date: 11/08/2017 Date of Consult: 11/08/2017  Primary Care Provider: Cyndi Bender Primary Cardiologist: Dr. Martinique   Primary Electrophysiologist:  Dr. Lovena Le   Patient Profile:   Victor [name] is a 81 y.o. 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 Marthenia Rolling.  History of Present Illness:   Mr. Maye was recently seen in clinic by Dr. Martinique on 09/05/17 and by Chanetta Marshall NP on 09/14/17. He was in his usual state of health during both visits with no mediation changes. It was noted that he was previously intolerant to entresto due to hypotension. Has been on zebeta and losartan.   He presented to OSH with complaints of shortness of breath, but no chest pain. Troponin found to be elevated to 1.5, repeat was 1.7. Echo with some improvement of LFEV to 45-50%. Also noted moderate MR, mild AS, and mild-to-moderate tricuspid regurgitation. OSH communicated with Dr. Stanford Breed and he was transferred to [facility] for further workup.   On admission, he was on ASA, plavix, and heparin drip. He reports shortness of breath that may be related to a heart failure exacerbation. CXR consistent with mild CHF. K on admission was 3.9, noted to be high-normal at OSH. Serum creatinine 1.23.   On my interview, he reports that his pills may have gotten mixed up and he may have been taking either too many or not enough of his medicines, he doesn't know which. He reports receiving IV lasix at OSH, but he has not voided yet today. He reports swelling in his abdomen and shortness of breath that have improved since  receiving lasix.   Weight on admission 162 lbs, which is his dry weight. He   Past Medical History:  Diagnosis Date  . Anemia   . Arthritis   . Automatic implantable cardioverter-defibrillator in situ   . Cardiomyopathy, ischemic    a. s/p ICD placement  . Chronic combined systolic and diastolic CHF (congestive heart failure) ([place])   . Chronic lower back pain   . CKD (chronic kidney disease), stage III ([place])   . Coronary artery disease    a. s/p CABG 1990. b. multiple PCIs since that time including CTO angioplasty (without stenting) PCI of RCA 09/2014, with last cath 02/2015 done for worsening CHF with unsuccessful attempt at PCI of the RCA due to inability to cross occlusion - medical therapy advised at that time.  Marland Kitchen GERD (gastroesophageal reflux disease)   . HLD (hyperlipidemia)   . Hypertension   . Occlusion and stenosis of carotid artery without mention of cerebral infarction   . Orthostatic hypotension   . Paroxysmal ventricular tachycardia ([place])   . Peripheral vascular disease ([place])   . Raynaud's syndrome   . Stroke [facility])    a. CT head 06/2015: Small old lacunar infarct LEFT basal ganglia.  . Thrombocytopenia ([place])   . TIA (transient ischemic attack)    a. s/p LHC on 10/01/14   . Type II diabetes mellitus ([place])     Past Surgical History:  Procedure Laterality Date  . ANAL FISTULECTOMY  2000's  .  CARDIAC CATHETERIZATION  07/2014  . CARDIAC CATHETERIZATION  10/01/2014   Procedure: CORONARY BALLOON ANGIOPLASTY;  Surgeon: Darcey Cardy M Martinique, MD;  Location: [facility] CATH LAB;  Service: Cardiovascular;;  . CARDIAC DEFIBRILLATOR PLACEMENT  02/1989-12/2011   "I've had a total of 6 or 7 defibrillators put in" (09/30/2014)  . CATARACT EXTRACTION, BILATERAL Bilateral ~ 2009  . CORONARY ANGIOPLASTY  10/01/14   PTVA to CTO with restoration of TIMI 3  . CORONARY ANGIOPLASTY WITH STENT PLACEMENT  04/2013   "1"  . CORONARY ARTERY BYPASS GRAFT  01/1989   "CABG X3"   . FINGER FRACTURE SURGERY Left  ~ 2000   "pointer"  . FRACTURE SURGERY    . IMPLANTABLE CARDIOVERTER DEFIBRILLATOR (ICD) GENERATOR CHANGE Left 10/05/2011   Procedure: ICD GENERATOR CHANGE;  Surgeon: Evans Lance, MD;  Location: [facility] CATH LAB;  Service: Cardiovascular;  Laterality: Left;  . LEFT AND RIGHT HEART CATHETERIZATION WITH CORONARY/GRAFT ANGIOGRAM N/A 04/26/2013   Procedure: LEFT AND RIGHT HEART CATHETERIZATION WITH Beatrix Fetters;  Surgeon: Sherren Mocha, MD;  Location: [facility] CATH LAB;  Service: Cardiovascular;  Laterality: N/A;  . LEFT HEART CATHETERIZATION WITH CORONARY ANGIOGRAM N/A 08/01/2014   Procedure: LEFT HEART CATHETERIZATION WITH CORONARY ANGIOGRAM;  Surgeon: Sinclair Grooms, MD;  Location: [facility] CATH LAB;  Service: Cardiovascular;  Laterality: N/A;  . LEFT HEART CATHETERIZATION WITH CORONARY ANGIOGRAM N/A 03/20/2015   Procedure: LEFT HEART CATHETERIZATION WITH CORONARY ANGIOGRAM;  Surgeon: Paulette Lynch M Martinique, MD;  Location: [facility] CATH LAB;  Service: Cardiovascular;  Laterality: N/A;  . PERCUTANEOUS STENT INTERVENTION  04/26/2013   Procedure: PERCUTANEOUS STENT INTERVENTION;  Surgeon: Sherren Mocha, MD;  Location: [facility] CATH LAB;  Service: Cardiovascular;;  . RIGHT HEART CATHETERIZATION  10/01/2014   Procedure: RIGHT HEART CATH;  Surgeon: Joss Mcdill M Martinique, MD;  Location: [facility] CATH LAB;  Service: Cardiovascular;;     Home Medications:  Prior to Admission medications   Medication Sig Start Date End Date Taking? Authorizing Provider  aspirin (ASPIR-81) 81 MG EC tablet Take 81 mg by mouth at bedtime.    Yes [provider]  bisoprolol (ZEBETA) 5 MG tablet Take 0.5 tablets (2.5 mg total) by mouth daily. 01/27/17  Yes Martinique, Nkosi Cortright M, MD  clopidogrel (PLAVIX) 75 MG tablet Take 1 tablet (75 mg total) by mouth daily. 01/27/17  Yes Martinique, Rosana Farnell M, MD  DULoxetine (CYMBALTA) 30 MG capsule Take 60 mg by mouth daily. .   Yes [provider]  LEVEMIR FLEXTOUCH 100 UNIT/ML Pen INJECT 30 UNITS UNDER SKIN EACH MORNING  04/15/16  Yes [provider]  LYRICA 150 MG capsule Take 150 mg by mouth daily.  02/03/15  Yes [provider]  nitroGLYCERIN (NITROSTAT) 0.4 MG SL tablet Place 0.4 mg under the tongue every 5 (five) minutes as needed for chest pain.   Yes [provider]  traZODone (DESYREL) 50 MG tablet Take 1 tablet by mouth at bedtime. 08/10/16  Yes [provider]  vitamin B-12 (CYANOCOBALAMIN) 1000 MCG tablet Take 1,000 mcg by mouth daily.   Yes [provider]  furosemide (LASIX) 40 MG tablet Take 1 tablet (40 mg total) by mouth 2 (two) times daily. 07/20/17 09/14/17  Johnson, Clanford L, MD  losartan (COZAAR) 50 MG tablet Take 0.5 tablets (25 mg total) by mouth daily. Patient not taking: Reported on 09/14/2017 05/23/17 05/13/19  Martinique, Gurshan Settlemire M, MD  mupirocin ointment (BACTROBAN) 2 % Apply 1 application topically 2 (two) times daily. 06/24/15   [provider]  ONE TOUCH ULTRA TEST test strip CHECK SUGAR AS DIRECTED 3 TIMES DAILY 05/24/15   [provider]  potassium chloride SA (K-DUR,KLOR-CON) 20 MEQ tablet Take 2 tablets (40 mEq total) by mouth daily. 07/21/17 09/14/17  Murlean Iba, MD  SANTYL ointment Apply 1 application topically every other day. 03/04/15   [provider]    Inpatient Medications: Scheduled Meds: . aspirin EC  81 mg Oral QHS  . bisoprolol  2.5 mg Oral Daily  . clopidogrel  75 mg Oral Daily  . DULoxetine  60 mg Oral Daily  . insulin aspart  0-9 Units Subcutaneous TID WC  . potassium chloride  10 mEq Oral Daily  . pravastatin  40 mg Oral q1800  . pregabalin  150 mg Oral Daily  . spironolactone  12.5 mg Oral Daily  . vitamin B-12  1,000 mcg Oral Daily   Continuous Infusions: . heparin 1,250 Units/hr (11/08/17 1316)   PRN Meds: HYDROcodone-acetaminophen  Allergies:    Allergies  Allergen Reactions  . Coreg [Carvedilol] Other (See Comments)    Shortness of breath ,fatigue ,dizzyness   . Lovastatin Other  (See Comments)    CK elevation, Myalgias  . Ativan [Lorazepam] Other (See Comments)    Pt. had side effects    Social History:   Social History   Socioeconomic History  . Marital status: Married    Spouse name: Not on file  . Number of children: Not on file  . Years of education: Not on file  . Highest education level: Not on file  Social Needs  . Financial resource strain: Not on file  . Food insecurity - worry: Not on file  . Food insecurity - inability: Not on file  . Transportation needs - medical: Not on file  . Transportation needs - non-medical: Not on file  Occupational History  . Not on file  Tobacco Use  . Smoking status: Former Smoker    Years: 3.00    Types: Pipe  . Smokeless tobacco: Never Used  . Tobacco comment: "quit smoking pipe in early 1970's"  Substance and Sexual Activity  . Alcohol use: Yes    Comment: 09/30/2014 "wine w/dinner maybe once/yr"  . Drug use: No  . Sexual activity: No  Other Topics Concern  . 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 Has 3 daughters. 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     Family History:    Family History  Problem Relation Age of Onset  . Heart failure Mother 47  . Heart attack Father   . Leukemia Sister   . Stomach cancer Maternal Grandfather      ROS:  Please see the history of present illness.  ROS  All other ROS reviewed and negative.     Physical Exam/Data:   Vitals:   11/08/17 0635 11/08/17 0900 11/08/17 1258 11/08/17 1300  BP: (!) 158/73 121/70 (!) 159/53   Pulse:   78 79  Resp:   18 17  Temp: 98 F (36.7 C) 98.4 F (36.9 C)    TempSrc: Oral Oral Oral   SpO2:   98% 98%  Weight: 162 lb 6.4 oz (73.7 kg)     Height: 5\' 6"  (1.676 m)       Intake/Output Summary (Last 24 hours) at 11/08/2017 1450 Last data filed at 11/08/2017 1400 Gross per 24 hour  Intake 0 ml  Output -  Net 0 ml   Filed  Weights   11/08/17 0635  Weight: 162 lb 6.4 oz (73.7 kg)   Body mass index is 26.21 kg/m.  General:  Well nourished, well developed, in no acute distress HEENT: normal Neck: no JVD Vascular: No carotid bruits Cardiac:  normal S1, S2; RRR; no murmur  Lungs:  clear to auscultation bilaterally, no wheezing, rhonchi or rales  Abd: soft, nontender, no hepatomegaly  Ext: trace edema Musculoskeletal:  No deformities, BUE and BLE strength normal and equal Skin: warm and dry  Neuro:  CNs 2-12 intact, no focal abnormalities noted Psych:  Normal affect   EKG:  The EKG was personally reviewed and demonstrates:  penidng Telemetry:  Telemetry was personally reviewed and demonstrates:  sinus  Relevant CV Studies:  Echo 07/18/17: Study Conclusions - Left ventricle: The cavity size was normal. Wall thickness was   increased in a pattern of mild LVH. Systolic function was   moderately reduced. The estimated ejection fraction was in the   range of 35% to 40%. There is akinesis of the basal-midinferior   myocardium. - Mitral valve: Calcified annulus. There was mild regurgitation. - Left atrium: The atrium was severely dilated. - Right ventricle: Pacer wire or catheter noted in right ventricle. - Tricuspid valve: There was moderate regurgitation. - Pulmonary arteries: Systolic pressure was mildly to moderately   increased. PA peak pressure: 49 mm Hg (S).  Impressions: - Compared to the prior study, there has been no significant   interval change.   Cath 03/20/15: Final Conclusions:   1. Severe 2 vessel obstructive CAD with chronic occlusion of OMs and RCA 2. Known occlusion of SVGs from prior caths. 3. Normal right heart and LV filling pressures. 4. Unsuccessful attempt at PCI of the RCA due to inability to cross occlusion in the mid vessel after the RV marginal branch.  Recommendations: Continue medical therapy. Will review with Dr. Irish Lack to see if there are any further options for  CTO PCI. If stable patient can be DC this weekend.   Laboratory Data:  Chemistry Recent Labs  Lab 11/08/17 0928  NA 138  K 3.9  CL 95*  CO2 32  GLUCOSE 190*  BUN 19  CREATININE 1.23  CALCIUM 8.5*  GFRNONAA 54*  GFRAA >60  ANIONGAP 11    Recent Labs  Lab 11/08/17 0928  PROT 7.1  ALBUMIN 3.1*  AST 28  ALT 22  ALKPHOS 95  BILITOT 1.1   Hematology Recent Labs  Lab 11/08/17 0928  WBC 9.7  RBC 4.02*  HGB 11.0*  HCT 34.3*  MCV 85.3  MCH 27.4  MCHC 32.1  RDW 16.9*  PLT 156   Cardiac EnzymesNo results for input(s): TROPONINI in the last 168 hours. No results for input(s): TROPIPOC in the last 168 hours.  BNPNo results for input(s): BNP, PROBNP in the last 168 hours.  DDimer No results for input(s): DDIMER in the last 168 hours.  Radiology/Studies:  No results found.  Assessment and Plan:   1. Elevated troponin - troponin at OSH peaked at 1.7 with sCr 1.10 - troponin pending - EKG pending, EKG from OSH without acute ischemic changes - continue ASA and plavix - heparin drip running - D/C this - this elevated troponin is likely related to demand ischemia in the setting of CHF exacerbation - no further ischemic evaluation at this time  2. Acute on chronic Combined systolic and diastolic heart failure, ischemic cardiomyopathy - pt does not appear  extremely volume overloaded on exam - weight is consistent with previous clinic dry weight of 162 lbs - pro-BNP was 13000, per OSH record - repeat BNP here pending - will give one dose of 40 mg IV lasix today - hold potassium tomorrow and reassess per daily labs - agree with low dose of aldactone - has been intolerant to entresto in the past due to hypotension  3. HTN - continue home meds: zebeta, losartan - monitor on new spiro  4. HLD - continue statin  5. CKD stage III - per primary - follow daily labs with medication adjustments and diuresis    For questions or updates, please contact [place] Please consult [email or web address] for contact info under Cardiology/STEMI.   Signed, Ledora Bottcher, PA  11/08/2017 2:50 PM  Patient seen and examined and history reviewed. Agree with above findings and plan. Mr. Fogg is well known to me. He presented to [facility] with complaints of increased dyspnea, PND and orthopnea along with increased abdominal girth. No chest pain. On Admission CXR showed mild edema. Pro BNP > 13,000. He was diuresed with IV lasix with improvement in symptoms. Echo showed EF 45-50%. Troponin peaked at 1.7.   On exam he is pleasant. Has diffuse bruises on arms. He has some JVD Lungs with mild basilar rales. CV RRR with gr 2/6 systolic murmur LSB. No LE edema. Abdomen soft NT.  Ecg reviewed and shows no new ischemic changes. I have personally reviewed and interpreted this study.  Impression:  1. Acute on chronic combined systolic/diastolic CHF. I think this is the primary issue. He has improved with IV lasix. Will give additional dose of IV lasix today. Can probably switch to po tomorrow. There may have been a mix up in his meds one week ago- his son measures it out for him. Will continue losartan and beta blocker. Try low dose aldactone. Need to watch potassium and renal function closely. Previously intolerant of Entresto due to hypotension. Hold potassium 2. Elevated troponin secondary to demand ischemia with CHF. Known CTO of OM and RCA. I would not recommend invasive evaluation at this point without clear cut angina.  3. CKD stage 2-3 . 4. HTN- controlled.  5. HLD.   Karee Christopherson Martinique, [place] 11/08/2017 4:37 PM

## 2017-11-09 DIAGNOSIS — I5023 Acute on chronic systolic (congestive) heart failure: Secondary | ICD-10-CM

## 2017-11-09 LAB — CBC
HCT: 32.3 % — ABNORMAL LOW (ref 39.0–52.0)
Hemoglobin: 10.4 g/dL — ABNORMAL LOW (ref 13.0–17.0)
MCH: 27.4 pg (ref 26.0–34.0)
MCHC: 32.2 g/dL (ref 30.0–36.0)
MCV: 85.2 fL (ref 78.0–100.0)
Platelets: 166 10*3/uL (ref 150–400)
RBC: 3.79 MIL/uL — ABNORMAL LOW (ref 4.22–5.81)
RDW: 17 % — ABNORMAL HIGH (ref 11.5–15.5)
WBC: 7.2 10*3/uL (ref 4.0–10.5)

## 2017-11-09 LAB — BASIC METABOLIC PANEL
Anion gap: 5 (ref 5–15)
BUN: 16 mg/dL (ref 6–20)
CO2: 35 mmol/L — ABNORMAL HIGH (ref 22–32)
Calcium: 8.4 mg/dL — ABNORMAL LOW (ref 8.9–10.3)
Chloride: 94 mmol/L — ABNORMAL LOW (ref 101–111)
Creatinine, Ser: 1.09 mg/dL (ref 0.61–1.24)
GFR calc Af Amer: >60 mL/min (ref >60)
GFR calc non Af Amer: >60 mL/min (ref >60)
Glucose, Bld: 185 mg/dL — ABNORMAL HIGH (ref 65–99)
Potassium: 3.6 mmol/L (ref 3.5–5.1)
Sodium: 134 mmol/L — ABNORMAL LOW (ref 135–145)

## 2017-11-09 LAB — HEPARIN LEVEL (UNFRACTIONATED): Heparin Unfractionated: <0.1 IU/mL — ABNORMAL LOW (ref 0.30–0.70)

## 2017-11-09 LAB — GLUCOSE, CAPILLARY
Glucose-Capillary: 146 mg/dL — ABNORMAL HIGH (ref 65–99)
Glucose-Capillary: 203 mg/dL — ABNORMAL HIGH (ref 65–99)

## 2017-11-09 MED ORDER — DOCUSATE SODIUM 100 MG PO CAPS: 100.0000 mg | ORAL_CAPSULE | Freq: Every day | ORAL | 0 refills | Status: AC

## 2017-11-09 MED ORDER — FUROSEMIDE 40 MG PO TABS: 40.0000 mg | ORAL_TABLET | Freq: Every day | ORAL | Status: DC

## 2017-11-09 MED ORDER — FUROSEMIDE 40 MG PO TABS: 40.0000 mg | ORAL_TABLET | Freq: Two times a day (BID) | ORAL | 0 refills | Status: DC

## 2017-11-09 MED ORDER — PRAVASTATIN SODIUM 40 MG PO TABS: 40.0000 mg | ORAL_TABLET | Freq: Every day | ORAL | 0 refills | Status: AC

## 2017-11-09 MED ORDER — SPIRONOLACTONE 25 MG PO TABS: 12.5000 mg | ORAL_TABLET | Freq: Every day | ORAL | 0 refills | Status: DC

## 2017-11-09 MED ORDER — LOSARTAN POTASSIUM 25 MG PO TABS
25.0000 mg | ORAL_TABLET | Freq: Every day | ORAL | Status: DC
  Administered 2017-11-09: 25 mg via ORAL
  Filled 2017-11-09: qty 1

## 2017-11-09 MED ORDER — DOCUSATE SODIUM 100 MG PO CAPS: 100.0000 mg | ORAL_CAPSULE | Freq: Every day | ORAL | 0 refills | Status: DC

## 2017-11-09 MED ORDER — PRAVASTATIN SODIUM 40 MG PO TABS: 40.0000 mg | ORAL_TABLET | Freq: Every day | ORAL | 0 refills | Status: DC

## 2017-11-09 MED ORDER — FUROSEMIDE 40 MG PO TABS
40.0000 mg | ORAL_TABLET | Freq: Two times a day (BID) | ORAL | Status: DC
Start: 2017-11-09 — End: 2017-11-09
  Administered 2017-11-09: 40 mg via ORAL
  Filled 2017-11-09: qty 1

## 2017-11-09 MED ORDER — LOSARTAN POTASSIUM 50 MG PO TABS: 25.0000 mg | ORAL_TABLET | Freq: Every day | ORAL | 3 refills | Status: DC

## 2017-11-09 NOTE — Progress Notes (Signed)
Progress Note  Patient Name: Victor [name] Date of Encounter: 11/09/2017  Primary Cardiologist: Martinique  Subjective   Feeling much better today, breathing has improved.   Inpatient Medications    Scheduled Meds: . aspirin EC  81 mg Oral QHS  . bisoprolol  2.5 mg Oral Daily  . clopidogrel  75 mg Oral Daily  . docusate sodium  100 mg Oral QHS  . DULoxetine  60 mg Oral Daily  . furosemide  40 mg Oral BID  . insulin aspart  0-9 Units Subcutaneous TID WC  . insulin detemir  30 Units Subcutaneous Daily  . losartan  25 mg Oral Daily  . pravastatin  40 mg Oral q1800  . pregabalin  150 mg Oral Daily  . spironolactone  12.5 mg Oral Daily  . traZODone  50 mg Oral QHS  . vitamin B-12  1,000 mcg Oral Daily   Continuous Infusions:  PRN Meds: HYDROcodone-acetaminophen, polyethylene glycol, senna-docusate   Vital Signs    Vitals:   11/09/17 0400 11/09/17 0500 11/09/17 0600 11/09/17 0700  BP:      Pulse: 71 74 78 (!) 49  Resp: (!) 23 18 18 20   Temp:      TempSrc:      SpO2: 100% 98% 99% (!) 87%  Weight:      Height:        Intake/Output Summary (Last 24 hours) at 11/09/2017 1037 Last data filed at 11/09/2017 0800 Gross per 24 hour  Intake 940 ml  Output 1680 ml  Net -740 ml   Filed Weights   11/08/17 0635  Weight: 162 lb 6.4 oz (73.7 kg)    Telemetry    SR with PACs, PVCs - Personally Reviewed  Physical Exam   General: Pleasant frail older male appearing in no acute distress. Wearing [place] Head: Normocephalic, atraumatic.  Neck: Supple, no JVD. Lungs:  Resp regular and unlabored, CTA. Heart: RRR, S1, S2, no S3, S4, soft systolic murmur; no rub. Abdomen: Soft, non-tender, non-distended with normoactive bowel sounds. Extremities: No clubbing, cyanosis, edema. Distal pedal pulses are 2+ bilaterally. Lots of bruising to upper extremities  Neuro: Alert and oriented X 3. Moves all extremities spontaneously. Psych: Normal affect.  Labs    Chemistry Recent Labs    Lab 11/08/17 0928 11/09/17 0555  NA 138 134*  K 3.9 3.6  CL 95* 94*  CO2 32 35*  GLUCOSE 190* 185*  BUN 19 16  CREATININE 1.23 1.09  CALCIUM 8.5* 8.4*  PROT 7.1  --   ALBUMIN 3.1*  --   AST 28  --   ALT 22  --   ALKPHOS 95  --   BILITOT 1.1  --   GFRNONAA 54* >60  GFRAA >60 >60  ANIONGAP 11 5     Hematology Recent Labs  Lab 11/08/17 0928 11/09/17 0555  WBC 9.7 7.2  RBC 4.02* 3.79*  HGB 11.0* 10.4*  HCT 34.3* 32.3*  MCV 85.3 85.2  MCH 27.4 27.4  MCHC 32.1 32.2  RDW 16.9* 17.0*  PLT 156 166    Cardiac EnzymesNo results for input(s): TROPONINI in the last 168 hours. No results for input(s): TROPIPOC in the last 168 hours.   BNP Recent Labs  Lab 11/08/17 0928  BNP 796.6*     DDimer No results for input(s): DDIMER in the last 168 hours.    Radiology    No results found.  Cardiac Studies   N/a   Patient Profile  81 y.o. male with PMH of CAD s/p CABG in 1990 with known occlusion of both vein grafts, PCI to SVG-OM3 in 2014 and attempted CTO PCI of RCA 09/2014, chronic combined systoic and diastolic heart failure, ischemic cardiomyopathy with history of VT in 2012 s/p ICD, DM, CKD, HTN, HLD, PAD, and carotid stenosis who presented with shortness of breath and positive troponin.   Assessment & Plan    1. Acute on chronic combined HF: was given IV lasix with improvement. Reports he was originally on 40mg  oral Lasix BID, then reduced back to 20mg  BID. Question whether there was mix up of his medications. Has been transitioned to oral 40mg  BID today. Appears volume stable, would continue with this dose. Also added low dose spironolactone with stable labs this morning.  -- on good medical therapy with BB, ARB, spironolactone. Will need f/u labs as outpatient to monitor K+. Will arrange follow up appt.   2. Elevated troponin: 1.5>>1.7. No chest pain. Suspect demand ischemia in the setting of known CTO. No chest pain. No plans for invasive work up at this time.    3. CKD: Cr stable.   4. HTN: Generally controlled with current therapy  5. HL: on statin therapy   Signed, Reino Bellis, NP  11/09/2017, 10:37 AM  Pager # [phone]   For questions or updates, please contact [place] Please consult [email or web address] for contact info under Cardiology/STEMI.   Patient seen and examined and history reviewed. Agree with above findings and plan. Patient is doing better today and is back to baseline volume status. Renal function is good. Agree with lasix 40 mg bid. We added aldactone 12.5 mg daily. Will need close follow up of BMET as outpatient. He is stable for DC today from our standpoint and we will arrange follow up in our office.   Alayshia Marini Martinique, [place] 11/09/2017 11:00 AM

## 2017-11-09 NOTE — Evaluation (Addendum)
Physical Therapy Evaluation Patient Details Name: Victor [name] MRN: [id number] DOB: 1935/12/25 Today's Date: 11/09/2017   History of Present Illness  Pt is a 81 y.o. male transferred from [facility] for evaluation of NSTEMI. ECG showed no new ischemic changes. Determined to have elevated troponin likely related to CHF exacerbation. Pertinent PMH includes CKD, HTN, HLD, CAD< COPD, chronic neck/back pain, s/p AICD.     Clinical Impression  Patient evaluated by Physical Therapy with no further acute PT needs identified. PTA, pt mod indep with RW and electric scooter; has family available for 24/7 support. Today, mod indep with RW for mobility. All education has been completed and the patient has no further questions. Main complaint is regarding chronic neck and back pain, for which I recommend outpatient PT; pt in agreement with this and will discuss with his PCP. Do not feel pt necessarily requires HHPT services at this point; pt also stating he would refuse HHPT. Acute PT is signing off. Thank you for this referral.    Follow Up Recommendations Outpatient PT    Equipment Recommendations  None recommended by PT    Recommendations for Other Services OT consult     Precautions / Restrictions Precautions Precautions: Fall Restrictions Weight Bearing Restrictions: No      Mobility  Bed Mobility Overal bed mobility: Independent                Transfers Overall transfer level: Independent Equipment used: None                Ambulation/Gait Ambulation/Gait assistance: Modified independent (Device/Increase time) Ambulation Distance (Feet): 150 Feet Assistive device: Rolling walker (2 wheeled)   Gait velocity: Decreased Gait velocity interpretation: <1.8 ft/sec, indicative of risk for recurrent falls General Gait Details: Mod indep with RW. Slow, controlled amb with significant forward flexed posture and pt c/o constant chronic neck pain  Stairs             Wheelchair Mobility    Modified Rankin (Stroke Patients Only)       Balance Overall balance assessment: Needs assistance   Sitting balance-Leahy Scale: Good       Standing balance-Leahy Scale: Fair                               Pertinent Vitals/Pain Pain Assessment: Faces Faces Pain Scale: Hurts little more Pain Location: Neck Pain Descriptors / Indicators: Aching;Sore;Constant Pain Intervention(s): Monitored during session    Home Living Family/patient expects to be discharged to:: Private residence Living Arrangements: Other relatives Available Help at Discharge: Family;Available 24 hours/day Type of Home: House Home Access: Stairs to enter Entrance Stairs-Rails: Psychiatric nurse of Steps: 3 Home Layout: One level Home Equipment: Walker - 2 wheels;Walker - 4 wheels;Bedside commode;Shower seat;Electric scooter Additional Comments: If pt stays at his own home, family stays with him. Normally he stays at grandson's home with family available for assist    Prior Function Level of Independence: Independent with assistive device(s)         Comments: Mod indep with rollator or SPC inside home. Uses electric scooter for longer distances outside     Hand Dominance   Dominant Hand: Right    Extremity/Trunk Assessment   Upper Extremity Assessment Upper Extremity Assessment: Overall WFL for tasks assessed    Lower Extremity Assessment Lower Extremity Assessment: Overall WFL for tasks assessed    Cervical / Trunk Assessment Cervical / Trunk Assessment: Kyphotic  Communication   Communication: No difficulties  Cognition Arousal/Alertness: Awake/alert Behavior During Therapy: WFL for tasks assessed/performed Overall Cognitive Status: Within Functional Limits for tasks assessed                                        General Comments      Exercises     Assessment/Plan    PT Assessment All further PT needs  can be met in the next venue of care  PT Problem List Decreased strength;Decreased range of motion;Pain       PT Treatment Interventions      PT Goals (Current goals can be found in the Care Plan section)  Acute Rehab PT Goals PT Goal Formulation: All assessment and education complete, DC therapy    Frequency     Barriers to discharge        Co-evaluation               AM-PAC PT "6 Clicks" Daily Activity  Outcome Measure Difficulty turning over in bed (including adjusting bedclothes, sheets and blankets)?: None Difficulty moving from lying on back to sitting on the side of the bed? : None Difficulty sitting down on and standing up from a chair with arms (e.g., wheelchair, bedside commode, etc,.)?: None Help needed moving to and from a bed to chair (including a wheelchair)?: None Help needed walking in hospital room?: None Help needed climbing 3-5 steps with a railing? : A Little 6 Click Score: 23    End of Session Equipment Utilized During Treatment: Gait belt Activity Tolerance: Patient tolerated treatment well Patient left: in bed;with call bell/phone within reach Nurse Communication: Mobility status PT Visit Diagnosis: Other (comment)(Neck/back pain, decreased ROM and weakness)    Time: 5361-4431 PT Time Calculation (min) (ACUTE ONLY): 19 min   Charges:   PT Evaluation $PT Eval Low Complexity: 1 Low     PT G Codes:   PT G-Codes **NOT FOR INPATIENT CLASS** Functional Assessment Tool Used: AM-PAC 6 Clicks Basic Mobility Functional Limitation: Mobility: Walking and moving around Mobility: Walking and Moving Around Current Status (V4008): At least 1 percent but less than 20 percent impaired, limited or restricted Mobility: Walking and Moving Around Goal Status [phone]): At least 1 percent but less than 20 percent impaired, limited or restricted Mobility: Walking and Moving Around Discharge Status [phone]): At least 1 percent but less than 20 percent impaired,  limited or restricted   Mabeline Caras, PT, DPT Acute Rehab Services  Pager: [place] 11/09/2017, 1:16 PM

## 2017-11-09 NOTE — Care Management Note (Signed)
Case Management Note  Patient Details  Name: XHAIDEN COOMBS MRN: [id number] Date of Birth: 1936/01/12  Subjective/Objective:  CHF                 Action/Plan: CM talked to patient's grandson Josh for [facility] choice; Josh chose [facility]; Tim with [facility] called for arrangements.  Expected Discharge Date:  07/20/17               Expected Discharge Plan:  [place]  In-House Referral:     Discharge planning Services  CM Consult  Post Acute Care Choice:    Choice offered to:  Adult Children  DME Arranged:    DME Agency:  [facility] (formerly [employer])  [place]:  RN, Disease Management, PT [place] Agency:  [facility] (now [facility])  Status of Service:  In process, will continue to follow  If discussed at Long Length of Stay Meetings, dates discussed:    Additional Comments:  Royston Bake, RN 11/09/2017, 12:24 PM

## 2017-11-09 NOTE — Evaluation (Signed)
Occupational Therapy Evaluation Patient Details Name: Victor [name] MRN: [id number] DOB: March 11, 1936 Today's Date: 11/09/2017    History of Present Illness Pt is a 81 y.o. male transferred from [facility] for evaluation of NSTEMI. ECG showed no new ischemic changes. Determined to have elevated troponin likely related to CHF exacerbation. Pertinent PMH includes CKD, HTN, HLD, CAD< COPD, chronic neck/back pain, s/p AICD.    Clinical Impression   Patient evaluated by Occupational Therapy with no further acute OT needs identified. All education has been completed and the patient has no further questions. All education completed.  See below for any follow-up Occupational Therapy or equipment needs. OT is signing off. Thank you for this referral. Recommend OPPT       Follow Up Recommendations  No OT follow up;Supervision/Assistance - 24 hour    Equipment Recommendations  None recommended by OT    Recommendations for Other Services       Precautions / Restrictions Precautions Precautions: Fall      Mobility Bed Mobility Overal bed mobility: Independent                Transfers Overall transfer level: Modified independent                    Balance Overall balance assessment: Needs assistance Sitting-balance support: Feet supported Sitting balance-Leahy Scale: Good       Standing balance-Leahy Scale: Fair                             ADL either performed or assessed with clinical judgement   ADL Overall ADL's : Needs assistance/impaired Eating/Feeding: Independent   Grooming: Wash/dry hands;Wash/dry face;Oral care;Supervision/safety;Standing   Upper Body Bathing: Set up;Sitting   Lower Body Bathing: Supervison/ safety;Sit to/from stand   Upper Body Dressing : Set up;Sitting   Lower Body Dressing: Supervision/safety;Sit to/from stand   Toilet Transfer: Supervision/safety;Ambulation;Comfort height toilet;RW   Toileting- Clothing  Manipulation and Hygiene: Supervision/safety;Sit to/from stand       Functional mobility during ADLs: Supervision/safety;Rolling walker       Vision         Perception     Praxis      Pertinent Vitals/Pain Pain Assessment: Faces Faces Pain Scale: Hurts little more Pain Location: Neck Pain Descriptors / Indicators: Aching;Sore;Constant Pain Intervention(s): Other (comment)(kinesiotape applied )     Hand Dominance Right   Extremity/Trunk Assessment Upper Extremity Assessment Upper Extremity Assessment: Overall WFL for tasks assessed   Lower Extremity Assessment Lower Extremity Assessment: Defer to PT evaluation   Cervical / Trunk Assessment Cervical / Trunk Assessment: Kyphotic;Other exceptions(capital extension of head/neck)   Communication Communication Communication: No difficulties   Cognition Arousal/Alertness: Awake/alert Behavior During Therapy: WFL for tasks assessed/performed Overall Cognitive Status: Within Functional Limits for tasks assessed                                     General Comments  Kinesiotape applied to paraspinous area of cervical spine on 50% stretch and third strip across shoulders on 80% stretch.  Pt insructed in kinesiotape - signs of irritation, how to remove safely and was able to verbalize this info back     Exercises Exercises: Other exercises Other Exercises Other Exercises: Pt instructed to lie on rolled hand towel placed along the length of spine to self mobilize thoracic spine into extension.  He was able to demonstrate understanding and reports improvement in pain     Shoulder Instructions      Home Living Family/patient expects to be discharged to:: Private residence Living Arrangements: Other relatives Available Help at Discharge: Family;Available 24 hours/day Type of Home: House Home Access: Stairs to enter [employer] of Steps: 3 Entrance Stairs-Rails: Right;Left Home Layout: One level      Bathroom Shower/Tub: Occupational psychologist: Standard     Home Equipment: Environmental consultant - 2 wheels;Walker - 4 wheels;Bedside commode;Shower seat;Electric scooter   Additional Comments: If pt stays at his own home, family stays with him. Normally he stays at grandson's home with family available for assist      Prior Functioning/Environment Level of Independence: Independent with assistive device(s)        Comments: Mod indep with rollator or SPC inside home. Uses electric scooter for longer distances outside        OT Problem List: Decreased activity tolerance;Pain      OT Treatment/Interventions:      OT Goals(Current goals can be found in the care plan section) Acute Rehab OT Goals Patient Stated Goal: to go home  OT Goal Formulation: All assessment and education complete, DC therapy  OT Frequency:     Barriers to D/C:            Co-evaluation              AM-PAC PT "6 Clicks" Daily Activity     Outcome Measure Help from another person eating meals?: None Help from another person taking care of personal grooming?: A Little Help from another person toileting, which includes using toliet, bedpan, or urinal?: A Little Help from another person bathing (including washing, rinsing, drying)?: A Little Help from another person to put on and taking off regular upper body clothing?: A Little Help from another person to put on and taking off regular lower body clothing?: A Little 6 Click Score: 19   End of Session Equipment Utilized During Treatment: Oxygen Nurse Communication: Mobility status  Activity Tolerance: Patient tolerated treatment well Patient left: in bed;with call bell/phone within reach;with family/visitor present  OT Visit Diagnosis: Pain Pain - part of body: (neck )                Time: 5638-7564 OT Time Calculation (min): 36 min Charges:  OT General Charges $OT Visit: 1 Visit OT Evaluation $OT Eval Moderate Complexity: 1 Mod OT  Treatments $Therapeutic Activity: 8-22 mins G-Codes:     [employer], OTR/L [phone]   Lucille Passy M 11/09/2017, 6:37 PM

## 2017-11-09 NOTE — Progress Notes (Signed)
Reviewed/educated patient on D/C medications, instructions, follow-up appointments, Rx, when to call the MD. Answered all questions. Pt has all belongings, instructions, and  Rx. D/C with friend home via wheelchair on his home oxygen. Removed all lines before D/C.  Pt much improved. Pola Corn, RN

## 2017-11-09 NOTE — Discharge Summary (Signed)
Physician Discharge Summary  Victor [name] PYK:[id number] DOB: December 10, 1935 DOA: 11/08/2017  PCP: Cyndi Bender, PA-C  Admit date: 11/08/2017 Discharge date: 11/09/2017  Admitted From: Home  Disposition:  Home   Recommendations for Outpatient Follow-up:  1. Follow up with PCP in 1-2 weeks 2. Please obtain BMP/CBC in one week 3. Please follow up on the following pending results: adjust lasix as needed.  4. Needs b-met to follow k and renal function.   Home Health: yes.   Discharge Condition; Stable.  CODE STATUS: full code.  Diet recommendation: Heart Healthy  Brief/Interim Summary:  81 year old Caucasian male with past medical history significant for CHF, MI, HL, AICD, emphysema and S/P CABG. Patient was transferred from [facility]. Apparently, patient was admitted to [facility] with SOB, chest pain and elevated troponin. SOB had improved. Patient continues to have intermittent chest pain that is reported to go to the jaws. Patient was said to have cardiac cath about a year ago but angioplasty was not feasible. EF was 35-40% but now improved to 45-50%. Patient has been on heparin drip. The cardiology team at [facility] discussed with the cardiology team at [facility] and it was advised that the patient should be transferred to [facility]. No headache, no fever or chills, no GI symptoms and no urinary symptoms.   1-Elevation of troponin. Related to heart failure exacerbation. No further cardiac evaluation per Dr Martinique.   2-Acute on chronic Systolic HF exacerbation;  Treated with IV lasix.  Weight 162--161 Urine out put 1.6 l.  Cardiology recommend oral lasix 40 mg BID and ok to discharge home/   3-DM; continue with levemir.    Discharge Diagnoses:  Active Problems:   Acute on chronic systolic CHF (congestive heart failure) (HCC)   Stage 3 chronic kidney disease (HCC)   NSTEMI (non-ST elevated myocardial infarction) (HCC)   Elevated  troponin   Demand ischemia [facility])    Discharge Instructions  Discharge Instructions    Diet - low sodium heart healthy   Complete by:  As directed    Increase activity slowly   Complete by:  As directed      Allergies as of 11/09/2017      Reactions   Coreg [carvedilol] Other (See Comments)   Shortness of breath ,fatigue ,dizzyness    Lovastatin Other (See Comments)   CK elevation, Myalgias   Ativan [lorazepam] Other (See Comments)   Pt. had side effects      Medication List    STOP taking these medications   potassium chloride SA 20 MEQ tablet Commonly known as:  K-DUR,KLOR-CON     TAKE these medications   ASPIR-81 81 MG EC tablet Generic drug:  aspirin Take 81 mg by mouth at bedtime.   bisoprolol 5 MG tablet Commonly known as:  ZEBETA Take 0.5 tablets (2.5 mg total) by mouth daily.   clopidogrel 75 MG tablet Commonly known as:  PLAVIX Take 1 tablet (75 mg total) by mouth daily.   docusate sodium 100 MG capsule Commonly known as:  COLACE Take 1 capsule (100 mg total) by mouth at bedtime.   DULoxetine 60 MG capsule Commonly known as:  CYMBALTA Take 60 mg by mouth daily.   furosemide 40 MG tablet Commonly known as:  LASIX Take 1 tablet (40 mg total) by mouth 2 (two) times daily.   HUMALOG KWIKPEN 100 UNIT/ML KiwkPen Generic drug:  insulin lispro   LEVEMIR FLEXTOUCH 100 UNIT/ML Pen Generic drug:  Insulin Detemir  INJECT 30 UNITS UNDER SKIN EACH MORNING   losartan 50 MG tablet Commonly known as:  COZAAR Take 0.5 tablets (25 mg total) by mouth daily.   LYRICA 150 MG capsule Generic drug:  pregabalin Take 150 mg by mouth daily.   mupirocin ointment 2 % Commonly known as:  BACTROBAN Apply 1 application topically 2 (two) times daily.   nitroGLYCERIN 0.4 MG SL tablet Commonly known as:  NITROSTAT Place 0.4 mg under the tongue every 5 (five) minutes as needed for chest pain.   pravastatin 40 MG tablet Commonly known as:  PRAVACHOL Take 1 tablet  (40 mg total) by mouth daily at 6 PM.   spironolactone 25 MG tablet Commonly known as:  ALDACTONE Take 0.5 tablets (12.5 mg total) by mouth daily. Start taking on:  11/10/2017   traMADol 50 MG tablet Commonly known as:  ULTRAM Take 50 mg by mouth 3 (three) times daily as needed for pain.   traZODone 50 MG tablet Commonly known as:  DESYREL Take 1 tablet by mouth at bedtime.   vitamin B-12 1000 MCG tablet Commonly known as:  CYANOCOBALAMIN Take 1,000 mcg by mouth daily.       Allergies  Allergen Reactions  . Coreg [Carvedilol] Other (See Comments)    Shortness of breath ,fatigue ,dizzyness   . Lovastatin Other (See Comments)    CK elevation, Myalgias  . Ativan [Lorazepam] Other (See Comments)    Pt. had side effects    Consultations:  Cardiology    Procedures/Studies:  No results found.    Subjective: Report breathing back at baseline.  Denies chest pain.   Discharge Exam: Vitals:   11/09/17 0600 11/09/17 0700  BP:    Pulse: 78 (!) 49  Resp: 18 20  Temp:    SpO2: 99% (!) 87%   Vitals:   11/09/17 0400 11/09/17 0500 11/09/17 0600 11/09/17 0700  BP:      Pulse: 71 74 78 (!) 49  Resp: (!) _0 Temp:      TempSrc:      SpO2: 100% 98% 99% (!) 87%  Weight:      Height:        General: Pt is alert, awake, not in acute distress Cardiovascular: RRR, S1/S2 +, no rubs, no gallops Respiratory: CTA bilaterally, no wheezing, no rhonchi Abdominal: Soft, NT, ND, bowel sounds + Extremities: no edema, no cyanosis    The results of significant diagnostics from this hospitalization (including imaging, microbiology, ancillary and laboratory) are listed below for reference.     Microbiology: No results found for this or any previous visit (from the past 240 hour(s)).   Labs: BNP (last 3 results) Recent Labs    07/16/17 2041 09/05/17 1434 11/08/17 0928  BNP 1,436.0* 1,195.5* 762.2*   Basic Metabolic Panel: Recent Labs  Lab 11/08/17 0928  11/09/17 0555  NA 138 134*  K 3.9 3.6  CL 95* 94*  CO2 32 35*  GLUCOSE 190* 185*  BUN 19 16  CREATININE 1.23 1.09  CALCIUM 8.5* 8.4*  MG 1.8  --   PHOS 2.3*  --    Liver Function Tests: Recent Labs  Lab 11/08/17 0928  AST 28  ALT 22  ALKPHOS 95  BILITOT 1.1  PROT 7.1  ALBUMIN 3.1*   No results for input(s): LIPASE, AMYLASE in the last 168 hours. No results for input(s): AMMONIA in the last 168 hours. CBC: Recent Labs  Lab 11/08/17 0928 11/09/17 0555  WBC 9.7 7.2  NEUTROABS 7.5  --   HGB 11.0* 10.4*  HCT 34.3* 32.3*  MCV 85.3 85.2  PLT 156 166   Cardiac Enzymes: No results for input(s): CKTOTAL, CKMB, CKMBINDEX, TROPONINI in the last 168 hours. BNP: Invalid input(s): POCBNP CBG: Recent Labs  Lab 11/08/17 1112 11/08/17 1137 11/08/17 1623 11/08/17 2107 11/09/17 0722  GLUCAP 181* 173* 149* 295* 146*   D-Dimer No results for input(s): DDIMER in the last 72 hours. Hgb A1c No results for input(s): HGBA1C in the last 72 hours. Lipid Profile No results for input(s): CHOL, HDL, LDLCALC, TRIG, CHOLHDL, LDLDIRECT in the last 72 hours. Thyroid function studies No results for input(s): TSH, T4TOTAL, T3FREE, THYROIDAB in the last 72 hours.  Invalid input(s): FREET3 Anemia work up No results for input(s): VITAMINB12, FOLATE, FERRITIN, TIBC, IRON, RETICCTPCT in the last 72 hours. Urinalysis    Component Value Date/Time   COLORURINE YELLOW 07/17/2017 0547   APPEARANCEUR CLEAR 07/17/2017 0547   LABSPEC 1.008 07/17/2017 0547   PHURINE 5.0 07/17/2017 0547   GLUCOSEU NEGATIVE 07/17/2017 0547   HGBUR NEGATIVE 07/17/2017 0547   BILIRUBINUR NEGATIVE 07/17/2017 0547   KETONESUR NEGATIVE 07/17/2017 0547   PROTEINUR NEGATIVE 07/17/2017 0547   NITRITE NEGATIVE 07/17/2017 0547   LEUKOCYTESUR SMALL (A) 07/17/2017 0547   Sepsis Labs Invalid input(s): PROCALCITONIN,  WBC,  LACTICIDVEN Microbiology No results found for this or any previous visit (from the past 240  hour(s)).   Time coordinating discharge: Over 30 minutes  SIGNED:   Elmarie Shiley, MD  [facility] 11/09/2017, 11:26 AM Pager   If 7PM-7AM, please contact night-coverage [email or web address] Password [email or web address]

## 2017-11-09 NOTE — Care Management Note (Signed)
Case Management Note  Patient Details  Name: HATTIE AGUINALDO MRN: [id number] Date of Birth: 08-08-1936  Subjective/Objective:     CHF              Action/Plan: CM talked to patient's grandson Josh for [facility] choice; Josh chose [facility]; Tim with [facility] called for arrangements.  Expected Discharge Date:  11/09/17               Expected Discharge Plan:  [place]  In-House Referral:   [facility]  Discharge planning Services  CM Consult  Choice offered to:  Adult Children  HH Arranged:  RN, Disease Management, PT [place] Agency:  [facility] ([facility])  Status of Service:  In process, will continue to follow  Sherrilyn Rist [phone] 11/09/2017, 12:26 PM

## 2017-11-16 ENCOUNTER — Ambulatory Visit: Payer: Medicare Other | Admitting: Physician Assistant

## 2017-11-16 NOTE — Progress Notes (Deleted)
Cardiology Office Note    Date:  11/16/2017   ID:  Victor [name], DOB 1936-03-11, MRN [id number]  PCP:  Cyndi Bender, PA-C  Cardiologist:   Dr. Peter Martinique   Electrophysiologist:  Dr. Cristopher Peru  No chief complaint on file.   History of Present Illness:  Victor [name] is a 81 y.o. male with PMH of CAD s/p CABG 1990 (known occlusion of both vein grafts, PCI to SVG-OM3 2014, attempted CTO PCI of RCA 09/2014), ICM with h/o VT s/p ICD, chronic combined systolic and diastolic heart failure, HTN, HLD, PAD, carotid artery disease and DM II. patient was admitted in April 2016 with acute heart failure exacerbation.  He had new EKG changes and underwent cardiac catheterization with unsuccessful repeat attempt at PCI of CTO RCA.  Medical therapy was continued.  There was attempt to put him on Entresto in the past, however he was unable to tolerate due to hypotension.  His last episode of VT was in 2012 and underwent [place] by Dr. Lovena Le afterward.  He was admitted at [facility] hospital in June 2018 for CHF exacerbation.  ProBNP was greater than 6000 at the time.  Echo showed a stable EF 35%.  He had a fall and was admitted in the hospital in [facility].  He was admitted for acute CHF exacerbation in August, multiple falls and hyponatremia with acute kidney injury.  Creatinine rose to 2.37.  Myoglobin was down to 7.4.  He was transfused 1 unit of blood.  He underwent IV diuresis.  Echo was unchanged.  ARB/ACE inhibitor was held.  He was eventually transitioned to 40 mg twice daily of Lasix with losartan 25 mg daily on discharge.  Most recently he came to the hospital on 11/08/2017 with shortness of breath, but no chest pain.  Troponin was found to be elevated at 1.5.  Echo showed EF was improved to 45-50%, moderate MR and mild AS.  He initially went to outside hospital but was later transferred to [facility].  He underwent IV diuresis with improvement of his  symptoms.  Although in previously Aldactone was held due to hypotension, it was restarted during this admission along with 40 mg twice daily of Lasix.  No EKG   Past Medical History:  Diagnosis Date  . Anemia   . Arthritis   . Automatic implantable cardioverter-defibrillator in situ 2012  . Cardiomyopathy, ischemic    a. s/p ICD placement  . Chronic combined systolic and diastolic CHF (congestive heart failure) ([place])   . Chronic lower back pain   . Chronic neck pain   . CKD (chronic kidney disease), stage III ([place])   . COPD (chronic obstructive pulmonary disease) ([place])   . Coronary artery disease    a. s/p CABG 1990. b. multiple PCIs since that time including CTO angioplasty (without stenting) PCI of RCA 09/2014, with last cath 02/2015 done for worsening CHF with unsuccessful attempt at PCI of the RCA due to inability to cross occlusion - medical therapy advised at that time.  Marland Kitchen GERD (gastroesophageal reflux disease)   . History of blood transfusion ~ 05/2017   "got 4 units"  . HLD (hyperlipidemia)   . Hypertension   . NSTEMI (non-ST elevated myocardial infarction) ([place]) 10/2017   Archie Endo 11/08/2017  . Occlusion and stenosis of carotid artery without mention of cerebral infarction   . On home oxygen therapy    "2L; 24/7" (11/08/2017)  . Orthostatic hypotension   .  Paroxysmal ventricular tachycardia ([place])   . Peripheral vascular disease ([place])   . Raynaud's syndrome   . Stroke [facility])    a. CT head 06/2015: Small old lacunar infarct LEFT basal ganglia.  . Thrombocytopenia ([place])   . TIA (transient ischemic attack)    a. s/p LHC on 10/01/14   . Type II diabetes mellitus ([place])     Past Surgical History:  Procedure Laterality Date  . ANAL FISTULECTOMY  2000's  . CARDIAC CATHETERIZATION  07/2014  . CARDIAC CATHETERIZATION  10/01/2014   Procedure: CORONARY BALLOON ANGIOPLASTY;  Surgeon: Peter M Martinique, MD;  Location: [facility] CATH LAB;  Service: Cardiovascular;;  . CARDIAC DEFIBRILLATOR  PLACEMENT  02/1989-12/2011   "I've had a total of 6 or 7 defibrillators put in" (09/30/2014)  . CATARACT EXTRACTION, BILATERAL Bilateral ~ 2009  . CORONARY ANGIOPLASTY  10/01/14   PTVA to CTO with restoration of TIMI 3  . CORONARY ANGIOPLASTY WITH STENT PLACEMENT  04/2013   "1"  . CORONARY ARTERY BYPASS GRAFT  01/1989   "CABG X3"   . FINGER FRACTURE SURGERY Left ~ 2000   "pointer"  . FRACTURE SURGERY    . IMPLANTABLE CARDIOVERTER DEFIBRILLATOR (ICD) GENERATOR CHANGE Left 10/05/2011   Procedure: ICD GENERATOR CHANGE;  Surgeon: Evans Lance, MD;  Location: [facility] CATH LAB;  Service: Cardiovascular;  Laterality: Left;  . LEFT AND RIGHT HEART CATHETERIZATION WITH CORONARY/GRAFT ANGIOGRAM N/A 04/26/2013   Procedure: LEFT AND RIGHT HEART CATHETERIZATION WITH Beatrix Fetters;  Surgeon: Sherren Mocha, MD;  Location: [facility] CATH LAB;  Service: Cardiovascular;  Laterality: N/A;  . LEFT HEART CATHETERIZATION WITH CORONARY ANGIOGRAM N/A 08/01/2014   Procedure: LEFT HEART CATHETERIZATION WITH CORONARY ANGIOGRAM;  Surgeon: Sinclair Grooms, MD;  Location: [facility] CATH LAB;  Service: Cardiovascular;  Laterality: N/A;  . LEFT HEART CATHETERIZATION WITH CORONARY ANGIOGRAM N/A 03/20/2015   Procedure: LEFT HEART CATHETERIZATION WITH CORONARY ANGIOGRAM;  Surgeon: Peter M Martinique, MD;  Location: [facility] CATH LAB;  Service: Cardiovascular;  Laterality: N/A;  . PERCUTANEOUS STENT INTERVENTION  04/26/2013   Procedure: PERCUTANEOUS STENT INTERVENTION;  Surgeon: Sherren Mocha, MD;  Location: [facility] CATH LAB;  Service: Cardiovascular;;  . RIGHT HEART CATHETERIZATION  10/01/2014   Procedure: RIGHT HEART CATH;  Surgeon: Peter M Martinique, MD;  Location: [facility] CATH LAB;  Service: Cardiovascular;;    Current Medications: Outpatient Medications Prior to Visit  Medication Sig Dispense Refill  . aspirin (ASPIR-81) 81 MG EC tablet Take 81 mg by mouth at bedtime.     . bisoprolol (ZEBETA) 5 MG tablet Take 0.5 tablets (2.5 mg total) by mouth  daily. 90 tablet 3  . clopidogrel (PLAVIX) 75 MG tablet Take 1 tablet (75 mg total) by mouth daily. 90 tablet 3  . docusate sodium (COLACE) 100 MG capsule Take 1 capsule (100 mg total) by mouth at bedtime. 10 capsule 0  . DULoxetine (CYMBALTA) 60 MG capsule Take 60 mg by mouth daily.  3  . furosemide (LASIX) 40 MG tablet Take 1 tablet (40 mg total) by mouth 2 (two) times daily. 60 tablet 0  . HUMALOG KWIKPEN 100 UNIT/ML KiwkPen   5  . LEVEMIR FLEXTOUCH 100 UNIT/ML Pen INJECT 30 UNITS UNDER SKIN EACH MORNING  1  . losartan (COZAAR) 50 MG tablet Take 0.5 tablets (25 mg total) by mouth daily. 90 tablet 3  . LYRICA 150 MG capsule Take 150 mg by mouth daily.   1  . mupirocin ointment (BACTROBAN) 2 % Apply 1 application topically 2 (two)  times daily.  3  . nitroGLYCERIN (NITROSTAT) 0.4 MG SL tablet Place 0.4 mg under the tongue every 5 (five) minutes as needed for chest pain.    . pravastatin (PRAVACHOL) 40 MG tablet Take 1 tablet (40 mg total) by mouth daily at 6 PM. 30 tablet 0  . spironolactone (ALDACTONE) 25 MG tablet Take 0.5 tablets (12.5 mg total) by mouth daily. 30 tablet 0  . traMADol (ULTRAM) 50 MG tablet Take 50 mg by mouth 3 (three) times daily as needed for pain.  2  . traZODone (DESYREL) 50 MG tablet Take 1 tablet by mouth at bedtime.  1  . vitamin B-12 (CYANOCOBALAMIN) 1000 MCG tablet Take 1,000 mcg by mouth daily.     No facility-administered medications prior to visit.      Allergies:   Coreg [carvedilol]; Lovastatin; and Ativan [lorazepam]   Social History   Socioeconomic History  . Marital status: Widowed    Spouse name: Not on file  . Number of children: Not on file  . Years of education: Not on file  . Highest education level: Not on file  Social Needs  . Financial resource strain: Not on file  . Food insecurity - worry: Not on file  . Food insecurity - inability: Not on file  . Transportation needs - medical: Not on file  . Transportation needs - non-medical: Not on  file  Occupational History  . Not on file  Tobacco Use  . Smoking status: Former Smoker    Years: 3.00    Types: Pipe  . Smokeless tobacco: Never Used  . Tobacco comment: "quit smoking pipe in early 1970's"  Substance and Sexual Activity  . Alcohol use: Yes    Comment: 11/08/2017 "wine w/dinner maybe once/yr; or less"  . Drug use: No  . Sexual activity: No  Other Topics Concern  . 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 Has 3 daughters. 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      Family History:  The patient's ***family history includes Heart attack in his father; Heart failure (age of onset: 2) in his mother; Leukemia in his sister; Stomach cancer in his maternal grandfather.   ROS:   Please see the history of present illness.    ROS All other systems reviewed and are negative.   PHYSICAL EXAM:   VS:  There were no vitals taken for this visit.   GEN: Well nourished, well developed, in no acute distress  HEENT: normal  Neck: no JVD, carotid bruits, or masses Cardiac: ***RRR; no murmurs, rubs, or gallops,no edema  Respiratory:  clear to auscultation bilaterally, normal work of breathing GI: soft, nontender, nondistended, + BS MS: no deformity or atrophy  Skin: warm and dry, no rash Neuro:  Alert and Oriented x 3, Strength and sensation are intact Psych: euthymic mood, full affect  Wt Readings from Last 3 Encounters:  11/09/17 161 lb 9.6 oz (73.3 kg)  09/14/17 164 lb 3.2 oz (74.5 kg)  09/05/17 162 lb (73.5 kg)      Studies/Labs Reviewed:   EKG:  EKG is*** ordered today.  The ekg ordered today demonstrates ***  Recent Labs: 11/08/2017: ALT 22; B Natriuretic Peptide 796.6; Magnesium 1.8 11/09/2017: BUN 16; Creatinine, Ser 1.09; Hemoglobin 10.4; Platelets 166; Potassium 3.6; Sodium 134   Lipid Panel    Component Value Date/Time   CHOL 127 06/29/2015  5056   TRIG  111 06/29/2015 0508   HDL 36 (L) 06/29/2015 0508   CHOLHDL 3.5 06/29/2015 0508   VLDL 22 06/29/2015 0508   LDLCALC 69 06/29/2015 0508    Additional studies/ records that were reviewed today include:  ***    ASSESSMENT:    No diagnosis found.   PLAN:  In order of problems listed above:  1. ***    Medication Adjustments/Labs and Tests Ordered: Current medicines are reviewed at length with the patient today.  Concerns regarding medicines are outlined above.  Medication changes, Labs and Tests ordered today are listed in the Patient Instructions below. There are no Patient Instructions on file for this visit.   Hilbert Corrigan, [place]  11/16/2017 8:08 AM    [address], [place], [place]  [address] Phone: [phone]; Fax: [phone]

## 2017-11-17 ENCOUNTER — Encounter: Payer: Self-pay | Admitting: *Deleted

## 2017-11-27 NOTE — Telephone Encounter (Signed)
O2 orders faxed to [employer] at fax # [phone].

## 2017-12-02 DIAGNOSIS — Z888 Allergy status to other drugs, medicaments and biological substances status: Secondary | ICD-10-CM | POA: Diagnosis not present

## 2017-12-02 DIAGNOSIS — I2510 Atherosclerotic heart disease of native coronary artery without angina pectoris: Secondary | ICD-10-CM | POA: Diagnosis not present

## 2017-12-02 DIAGNOSIS — I491 Atrial premature depolarization: Secondary | ICD-10-CM | POA: Diagnosis not present

## 2017-12-02 DIAGNOSIS — I440 Atrioventricular block, first degree: Secondary | ICD-10-CM | POA: Diagnosis not present

## 2017-12-02 DIAGNOSIS — Z7982 Long term (current) use of aspirin: Secondary | ICD-10-CM | POA: Diagnosis not present

## 2017-12-02 DIAGNOSIS — I252 Old myocardial infarction: Secondary | ICD-10-CM | POA: Diagnosis not present

## 2017-12-02 DIAGNOSIS — E1165 Type 2 diabetes mellitus with hyperglycemia: Secondary | ICD-10-CM | POA: Diagnosis not present

## 2017-12-02 DIAGNOSIS — Z95810 Presence of automatic (implantable) cardiac defibrillator: Secondary | ICD-10-CM | POA: Diagnosis not present

## 2017-12-02 DIAGNOSIS — R079 Chest pain, unspecified: Secondary | ICD-10-CM | POA: Diagnosis not present

## 2017-12-02 DIAGNOSIS — I110 Hypertensive heart disease with heart failure: Secondary | ICD-10-CM | POA: Diagnosis not present

## 2017-12-02 DIAGNOSIS — I509 Heart failure, unspecified: Secondary | ICD-10-CM | POA: Diagnosis not present

## 2017-12-02 DIAGNOSIS — R748 Abnormal levels of other serum enzymes: Secondary | ICD-10-CM | POA: Diagnosis not present

## 2017-12-03 ENCOUNTER — Inpatient Hospital Stay (HOSPITAL_COMMUNITY)
Admission: AD | Admit: 2017-12-03 | Discharge: 2017-12-16 | DRG: 579 | Disposition: A | Discharge status: Home Health | Payer: Medicare Other | Source: Other Acute Inpatient Hospital | Attending: Cardiology | Admitting: Cardiology

## 2017-12-03 ENCOUNTER — Other Ambulatory Visit: Payer: Self-pay

## 2017-12-03 ENCOUNTER — Inpatient Hospital Stay (HOSPITAL_COMMUNITY): Payer: Medicare Other

## 2017-12-03 ENCOUNTER — Encounter (HOSPITAL_COMMUNITY): Payer: Self-pay

## 2017-12-03 DIAGNOSIS — I481 Persistent atrial fibrillation: Secondary | ICD-10-CM | POA: Diagnosis present

## 2017-12-03 DIAGNOSIS — R748 Abnormal levels of other serum enzymes: Secondary | ICD-10-CM | POA: Diagnosis not present

## 2017-12-03 DIAGNOSIS — M62838 Other muscle spasm: Secondary | ICD-10-CM | POA: Diagnosis not present

## 2017-12-03 DIAGNOSIS — J9621 Acute and chronic respiratory failure with hypoxia: Secondary | ICD-10-CM | POA: Diagnosis present

## 2017-12-03 DIAGNOSIS — Z79899 Other long term (current) drug therapy: Secondary | ICD-10-CM

## 2017-12-03 DIAGNOSIS — I10 Essential (primary) hypertension: Secondary | ICD-10-CM | POA: Diagnosis present

## 2017-12-03 DIAGNOSIS — R52 Pain, unspecified: Secondary | ICD-10-CM

## 2017-12-03 DIAGNOSIS — I4891 Unspecified atrial fibrillation: Secondary | ICD-10-CM | POA: Diagnosis not present

## 2017-12-03 DIAGNOSIS — G9341 Metabolic encephalopathy: Secondary | ICD-10-CM | POA: Diagnosis not present

## 2017-12-03 DIAGNOSIS — R0789 Other chest pain: Secondary | ICD-10-CM | POA: Diagnosis not present

## 2017-12-03 DIAGNOSIS — R072 Precordial pain: Secondary | ICD-10-CM

## 2017-12-03 DIAGNOSIS — W541XXA Struck by dog, initial encounter: Secondary | ICD-10-CM | POA: Diagnosis not present

## 2017-12-03 DIAGNOSIS — N183 Chronic kidney disease, stage 3 unspecified: Secondary | ICD-10-CM | POA: Diagnosis present

## 2017-12-03 DIAGNOSIS — N179 Acute kidney failure, unspecified: Secondary | ICD-10-CM | POA: Diagnosis not present

## 2017-12-03 DIAGNOSIS — I4819 Other persistent atrial fibrillation: Secondary | ICD-10-CM | POA: Diagnosis not present

## 2017-12-03 DIAGNOSIS — Z8249 Family history of ischemic heart disease and other diseases of the circulatory system: Secondary | ICD-10-CM

## 2017-12-03 DIAGNOSIS — Z79891 Long term (current) use of opiate analgesic: Secondary | ICD-10-CM | POA: Diagnosis not present

## 2017-12-03 DIAGNOSIS — K219 Gastro-esophageal reflux disease without esophagitis: Secondary | ICD-10-CM | POA: Diagnosis present

## 2017-12-03 DIAGNOSIS — R278 Other lack of coordination: Secondary | ICD-10-CM | POA: Diagnosis present

## 2017-12-03 DIAGNOSIS — R079 Chest pain, unspecified: Secondary | ICD-10-CM | POA: Diagnosis present

## 2017-12-03 DIAGNOSIS — J449 Chronic obstructive pulmonary disease, unspecified: Secondary | ICD-10-CM | POA: Diagnosis present

## 2017-12-03 DIAGNOSIS — I959 Hypotension, unspecified: Secondary | ICD-10-CM | POA: Diagnosis not present

## 2017-12-03 DIAGNOSIS — I110 Hypertensive heart disease with heart failure: Secondary | ICD-10-CM | POA: Diagnosis not present

## 2017-12-03 DIAGNOSIS — Z95810 Presence of automatic (implantable) cardiac defibrillator: Secondary | ICD-10-CM

## 2017-12-03 DIAGNOSIS — I6529 Occlusion and stenosis of unspecified carotid artery: Secondary | ICD-10-CM | POA: Diagnosis present

## 2017-12-03 DIAGNOSIS — I5043 Acute on chronic combined systolic (congestive) and diastolic (congestive) heart failure: Secondary | ICD-10-CM | POA: Diagnosis not present

## 2017-12-03 DIAGNOSIS — S20219A Contusion of unspecified front wall of thorax, initial encounter: Secondary | ICD-10-CM | POA: Diagnosis not present

## 2017-12-03 DIAGNOSIS — Z87891 Personal history of nicotine dependence: Secondary | ICD-10-CM | POA: Diagnosis not present

## 2017-12-03 DIAGNOSIS — R509 Fever, unspecified: Secondary | ICD-10-CM | POA: Diagnosis not present

## 2017-12-03 DIAGNOSIS — S3662XD Contusion of rectum, subsequent encounter: Secondary | ICD-10-CM | POA: Diagnosis not present

## 2017-12-03 DIAGNOSIS — I252 Old myocardial infarction: Secondary | ICD-10-CM | POA: Diagnosis not present

## 2017-12-03 DIAGNOSIS — E860 Dehydration: Secondary | ICD-10-CM | POA: Diagnosis present

## 2017-12-03 DIAGNOSIS — E11649 Type 2 diabetes mellitus with hypoglycemia without coma: Secondary | ICD-10-CM | POA: Diagnosis not present

## 2017-12-03 DIAGNOSIS — G4733 Obstructive sleep apnea (adult) (pediatric): Secondary | ICD-10-CM | POA: Diagnosis present

## 2017-12-03 DIAGNOSIS — I472 Ventricular tachycardia: Secondary | ICD-10-CM | POA: Diagnosis not present

## 2017-12-03 DIAGNOSIS — I272 Pulmonary hypertension, unspecified: Secondary | ICD-10-CM | POA: Diagnosis present

## 2017-12-03 DIAGNOSIS — R109 Unspecified abdominal pain: Secondary | ICD-10-CM | POA: Diagnosis not present

## 2017-12-03 DIAGNOSIS — I361 Nonrheumatic tricuspid (valve) insufficiency: Secondary | ICD-10-CM | POA: Diagnosis not present

## 2017-12-03 DIAGNOSIS — Z7982 Long term (current) use of aspirin: Secondary | ICD-10-CM

## 2017-12-03 DIAGNOSIS — I2510 Atherosclerotic heart disease of native coronary artery without angina pectoris: Secondary | ICD-10-CM | POA: Diagnosis present

## 2017-12-03 DIAGNOSIS — Z951 Presence of aortocoronary bypass graft: Secondary | ICD-10-CM

## 2017-12-03 DIAGNOSIS — R188 Other ascites: Secondary | ICD-10-CM | POA: Diagnosis present

## 2017-12-03 DIAGNOSIS — G8929 Other chronic pain: Secondary | ICD-10-CM | POA: Diagnosis present

## 2017-12-03 DIAGNOSIS — X58XXXD Exposure to other specified factors, subsequent encounter: Secondary | ICD-10-CM | POA: Diagnosis not present

## 2017-12-03 DIAGNOSIS — I471 Supraventricular tachycardia: Secondary | ICD-10-CM | POA: Diagnosis present

## 2017-12-03 DIAGNOSIS — Z66 Do not resuscitate: Secondary | ICD-10-CM | POA: Diagnosis not present

## 2017-12-03 DIAGNOSIS — Z794 Long term (current) use of insulin: Secondary | ICD-10-CM | POA: Diagnosis not present

## 2017-12-03 DIAGNOSIS — I351 Nonrheumatic aortic (valve) insufficiency: Secondary | ICD-10-CM | POA: Diagnosis not present

## 2017-12-03 DIAGNOSIS — Z7901 Long term (current) use of anticoagulants: Secondary | ICD-10-CM

## 2017-12-03 DIAGNOSIS — I071 Rheumatic tricuspid insufficiency: Secondary | ICD-10-CM | POA: Diagnosis present

## 2017-12-03 DIAGNOSIS — R5381 Other malaise: Secondary | ICD-10-CM | POA: Diagnosis not present

## 2017-12-03 DIAGNOSIS — I491 Atrial premature depolarization: Secondary | ICD-10-CM | POA: Diagnosis not present

## 2017-12-03 DIAGNOSIS — K8020 Calculus of gallbladder without cholecystitis without obstruction: Secondary | ICD-10-CM | POA: Diagnosis present

## 2017-12-03 DIAGNOSIS — I4892 Unspecified atrial flutter: Secondary | ICD-10-CM | POA: Diagnosis not present

## 2017-12-03 DIAGNOSIS — R7989 Other specified abnormal findings of blood chemistry: Secondary | ICD-10-CM | POA: Diagnosis not present

## 2017-12-03 DIAGNOSIS — I440 Atrioventricular block, first degree: Secondary | ICD-10-CM | POA: Diagnosis present

## 2017-12-03 DIAGNOSIS — I5022 Chronic systolic (congestive) heart failure: Secondary | ICD-10-CM | POA: Diagnosis present

## 2017-12-03 DIAGNOSIS — M1990 Unspecified osteoarthritis, unspecified site: Secondary | ICD-10-CM | POA: Diagnosis present

## 2017-12-03 DIAGNOSIS — I7300 Raynaud's syndrome without gangrene: Secondary | ICD-10-CM | POA: Diagnosis present

## 2017-12-03 DIAGNOSIS — E1122 Type 2 diabetes mellitus with diabetic chronic kidney disease: Secondary | ICD-10-CM | POA: Diagnosis present

## 2017-12-03 DIAGNOSIS — Z888 Allergy status to other drugs, medicaments and biological substances status: Secondary | ICD-10-CM

## 2017-12-03 DIAGNOSIS — E119 Type 2 diabetes mellitus without complications: Secondary | ICD-10-CM | POA: Diagnosis not present

## 2017-12-03 DIAGNOSIS — Z9981 Dependence on supplemental oxygen: Secondary | ICD-10-CM

## 2017-12-03 DIAGNOSIS — E1165 Type 2 diabetes mellitus with hyperglycemia: Secondary | ICD-10-CM | POA: Diagnosis not present

## 2017-12-03 DIAGNOSIS — R0602 Shortness of breath: Secondary | ICD-10-CM

## 2017-12-03 DIAGNOSIS — L02211 Cutaneous abscess of abdominal wall: Secondary | ICD-10-CM | POA: Diagnosis not present

## 2017-12-03 DIAGNOSIS — I255 Ischemic cardiomyopathy: Secondary | ICD-10-CM | POA: Diagnosis present

## 2017-12-03 DIAGNOSIS — I130 Hypertensive heart and chronic kidney disease with heart failure and stage 1 through stage 4 chronic kidney disease, or unspecified chronic kidney disease: Secondary | ICD-10-CM | POA: Diagnosis not present

## 2017-12-03 DIAGNOSIS — Z7902 Long term (current) use of antithrombotics/antiplatelets: Secondary | ICD-10-CM

## 2017-12-03 DIAGNOSIS — S301XXA Contusion of abdominal wall, initial encounter: Secondary | ICD-10-CM | POA: Diagnosis not present

## 2017-12-03 DIAGNOSIS — E785 Hyperlipidemia, unspecified: Secondary | ICD-10-CM | POA: Diagnosis present

## 2017-12-03 DIAGNOSIS — I509 Heart failure, unspecified: Secondary | ICD-10-CM | POA: Diagnosis not present

## 2017-12-03 DIAGNOSIS — I5023 Acute on chronic systolic (congestive) heart failure: Secondary | ICD-10-CM | POA: Diagnosis not present

## 2017-12-03 DIAGNOSIS — E1129 Type 2 diabetes mellitus with other diabetic kidney complication: Secondary | ICD-10-CM

## 2017-12-03 DIAGNOSIS — I454 Nonspecific intraventricular block: Secondary | ICD-10-CM | POA: Diagnosis present

## 2017-12-03 DIAGNOSIS — D72829 Elevated white blood cell count, unspecified: Secondary | ICD-10-CM | POA: Diagnosis present

## 2017-12-03 DIAGNOSIS — I5042 Chronic combined systolic (congestive) and diastolic (congestive) heart failure: Secondary | ICD-10-CM | POA: Diagnosis not present

## 2017-12-03 DIAGNOSIS — Z8673 Personal history of transient ischemic attack (TIA), and cerebral infarction without residual deficits: Secondary | ICD-10-CM

## 2017-12-03 DIAGNOSIS — R410 Disorientation, unspecified: Secondary | ICD-10-CM | POA: Diagnosis not present

## 2017-12-03 HISTORY — DX: Other malaise: R53.81

## 2017-12-03 HISTORY — DX: Other persistent atrial fibrillation: I48.19

## 2017-12-03 LAB — COMPREHENSIVE METABOLIC PANEL
ALT: 16 U/L — AB (ref 17–63)
AST: 19 U/L (ref 15–41)
Albumin: 3.6 g/dL (ref 3.5–5.0)
Alkaline Phosphatase: 79 U/L (ref 38–126)
Anion gap: 13 (ref 5–15)
BUN: 29 mg/dL — ABNORMAL HIGH (ref 6–20)
CHLORIDE: 93 mmol/L — AB (ref 101–111)
CO2: 27 mmol/L (ref 22–32)
CREATININE: 1.21 mg/dL (ref 0.61–1.24)
Calcium: 9.1 mg/dL (ref 8.9–10.3)
GFR calc Af Amer: >60 mL/min (ref >60)
GFR, EST NON AFRICAN AMERICAN: 55 mL/min — AB (ref >60)
Glucose, Bld: 288 mg/dL — ABNORMAL HIGH (ref 65–99)
POTASSIUM: 4 mmol/L (ref 3.5–5.1)
SODIUM: 133 mmol/L — AB (ref 135–145)
Total Bilirubin: 0.8 mg/dL (ref 0.3–1.2)
Total Protein: 6.6 g/dL (ref 6.5–8.1)

## 2017-12-03 LAB — HEPATIC FUNCTION PANEL
ALBUMIN: 3.1 g/dL — AB (ref 3.5–5.0)
ALT: 13 U/L — ABNORMAL LOW (ref 17–63)
AST: 15 U/L (ref 15–41)
Alkaline Phosphatase: 69 U/L (ref 38–126)
Bilirubin, Direct: 0.2 mg/dL (ref 0.1–0.5)
Indirect Bilirubin: 0.7 mg/dL (ref 0.3–0.9)
Total Bilirubin: 0.9 mg/dL (ref 0.3–1.2)
Total Protein: 6.1 g/dL — ABNORMAL LOW (ref 6.5–8.1)

## 2017-12-03 LAB — CBC
HCT: 37.7 % — ABNORMAL LOW (ref 39.0–52.0)
Hemoglobin: 12.7 g/dL — ABNORMAL LOW (ref 13.0–17.0)
MCH: 28 pg (ref 26.0–34.0)
MCHC: 33.7 g/dL (ref 30.0–36.0)
MCV: 83 fL (ref 78.0–100.0)
PLATELETS: 164 10*3/uL (ref 150–400)
RBC: 4.54 MIL/uL (ref 4.22–5.81)
RDW: 15.1 % (ref 11.5–15.5)
WBC: 24.1 10*3/uL — ABNORMAL HIGH (ref 4.0–10.5)

## 2017-12-03 LAB — GLUCOSE, CAPILLARY
GLUCOSE-CAPILLARY: 179 mg/dL — AB (ref 65–99)
GLUCOSE-CAPILLARY: 274 mg/dL — AB (ref 65–99)
GLUCOSE-CAPILLARY: 330 mg/dL — AB (ref 65–99)
Glucose-Capillary: 225 mg/dL — ABNORMAL HIGH (ref 65–99)
Glucose-Capillary: 339 mg/dL — ABNORMAL HIGH (ref 65–99)

## 2017-12-03 LAB — TROPONIN I
TROPONIN I: 0.06 ng/mL — AB (ref <0.03)
Troponin I: 0.07 ng/mL (ref <0.03)

## 2017-12-03 LAB — LIPASE, BLOOD: LIPASE: 23 U/L (ref 11–51)

## 2017-12-03 LAB — AMYLASE: AMYLASE: 27 U/L — AB (ref 28–100)

## 2017-12-03 MED ORDER — INSULIN ASPART 100 UNIT/ML ~~LOC~~ SOLN
0.0000 [IU] | Freq: Three times a day (TID) | SUBCUTANEOUS | Status: DC
  Administered 2017-12-03: 11 [IU] via SUBCUTANEOUS
  Administered 2017-12-03: 8 [IU] via SUBCUTANEOUS
  Administered 2017-12-03: 5 [IU] via SUBCUTANEOUS
  Administered 2017-12-04 (×2): 8 [IU] via SUBCUTANEOUS
  Administered 2017-12-04: 15 [IU] via SUBCUTANEOUS
  Administered 2017-12-05: 8 [IU] via SUBCUTANEOUS
  Administered 2017-12-05 (×2): 5 [IU] via SUBCUTANEOUS
  Administered 2017-12-06: 8 [IU] via SUBCUTANEOUS
  Administered 2017-12-06: 3 [IU] via SUBCUTANEOUS

## 2017-12-03 MED ORDER — TRAMADOL HCL 50 MG PO TABS
50.0000 mg | ORAL_TABLET | Freq: Three times a day (TID) | ORAL | Status: DC | PRN
  Administered 2017-12-03 – 2017-12-06 (×6): 50 mg via ORAL
  Filled 2017-12-03 (×6): qty 1

## 2017-12-03 MED ORDER — CLOPIDOGREL BISULFATE 75 MG PO TABS
75.0000 mg | ORAL_TABLET | Freq: Every day | ORAL | Status: DC
  Administered 2017-12-03 – 2017-12-10 (×8): 75 mg via ORAL
  Filled 2017-12-03 (×8): qty 1

## 2017-12-03 MED ORDER — DULOXETINE HCL 60 MG PO CPEP
60.0000 mg | ORAL_CAPSULE | Freq: Every day | ORAL | Status: DC
  Administered 2017-12-03 – 2017-12-06 (×4): 60 mg via ORAL
  Filled 2017-12-03 (×4): qty 1

## 2017-12-03 MED ORDER — INSULIN DETEMIR 100 UNIT/ML ~~LOC~~ SOLN
30.0000 [IU] | Freq: Every morning | SUBCUTANEOUS | Status: DC
  Administered 2017-12-04 – 2017-12-06 (×3): 30 [IU] via SUBCUTANEOUS
  Filled 2017-12-03 (×4): qty 0.3

## 2017-12-03 MED ORDER — HEPARIN (PORCINE) IN NACL 100-0.45 UNIT/ML-% IJ SOLN
12.00 | INTRAMUSCULAR | Status: DC
Start: ? — End: 2017-12-03

## 2017-12-03 MED ORDER — HEPARIN SODIUM (PORCINE) 1000 UNIT/ML IJ SOLN
2000.00 | INTRAMUSCULAR | Status: DC
Start: ? — End: 2017-12-03

## 2017-12-03 MED ORDER — FUROSEMIDE 40 MG PO TABS
40.0000 mg | ORAL_TABLET | Freq: Two times a day (BID) | ORAL | Status: DC
  Administered 2017-12-03 – 2017-12-06 (×7): 40 mg via ORAL
  Filled 2017-12-03 (×7): qty 1

## 2017-12-03 MED ORDER — NITROGLYCERIN 0.4 MG SL SUBL
0.4000 mg | SUBLINGUAL_TABLET | SUBLINGUAL | Status: DC | PRN
  Administered 2017-12-07 – 2017-12-09 (×3): 0.4 mg via SUBLINGUAL
  Filled 2017-12-03 (×2): qty 1

## 2017-12-03 MED ORDER — LOSARTAN POTASSIUM 25 MG PO TABS
25.0000 mg | ORAL_TABLET | Freq: Every day | ORAL | Status: DC
  Administered 2017-12-03 – 2017-12-06 (×4): 25 mg via ORAL
  Filled 2017-12-03 (×4): qty 1

## 2017-12-03 MED ORDER — SPIRONOLACTONE 12.5 MG HALF TABLET
12.5000 mg | ORAL_TABLET | Freq: Every day | ORAL | Status: DC
  Administered 2017-12-03 – 2017-12-06 (×4): 12.5 mg via ORAL
  Filled 2017-12-03 (×4): qty 1

## 2017-12-03 MED ORDER — INSULIN ASPART 100 UNIT/ML ~~LOC~~ SOLN
0.0000 [IU] | Freq: Every day | SUBCUTANEOUS | Status: DC
  Administered 2017-12-03: 4 [IU] via SUBCUTANEOUS
  Administered 2017-12-04: 2 [IU] via SUBCUTANEOUS
  Administered 2017-12-05: 5 [IU] via SUBCUTANEOUS

## 2017-12-03 MED ORDER — DOCUSATE SODIUM 100 MG PO CAPS
100.0000 mg | ORAL_CAPSULE | Freq: Every day | ORAL | Status: DC
  Administered 2017-12-03 – 2017-12-05 (×3): 100 mg via ORAL
  Filled 2017-12-03 (×3): qty 1

## 2017-12-03 MED ORDER — NITROGLYCERIN 0.4 MG SL SUBL
0.40 mg | SUBLINGUAL_TABLET | SUBLINGUAL | Status: DC
Start: ? — End: 2017-12-03

## 2017-12-03 MED ORDER — ONDANSETRON HCL 4 MG/2ML IJ SOLN: 4.0000 mg | Freq: Four times a day (QID) | INTRAMUSCULAR | Status: DC | PRN

## 2017-12-03 MED ORDER — BISOPROLOL FUMARATE 5 MG PO TABS
2.5000 mg | ORAL_TABLET | Freq: Every day | ORAL | Status: DC
  Administered 2017-12-04 – 2017-12-06 (×3): 2.5 mg via ORAL
  Filled 2017-12-03 (×3): qty 1

## 2017-12-03 MED ORDER — PREGABALIN 75 MG PO CAPS
150.0000 mg | ORAL_CAPSULE | Freq: Every day | ORAL | Status: DC
  Administered 2017-12-03 – 2017-12-06 (×4): 150 mg via ORAL
  Filled 2017-12-03 (×4): qty 2

## 2017-12-03 MED ORDER — ASPIRIN EC 81 MG PO TBEC
81.0000 mg | DELAYED_RELEASE_TABLET | Freq: Every day | ORAL | Status: DC
  Administered 2017-12-03 – 2017-12-07 (×4): 81 mg via ORAL
  Filled 2017-12-03 (×4): qty 1

## 2017-12-03 MED ORDER — PRAVASTATIN SODIUM 40 MG PO TABS
40.0000 mg | ORAL_TABLET | Freq: Every day | ORAL | Status: DC
  Administered 2017-12-03 – 2017-12-15 (×12): 40 mg via ORAL
  Filled 2017-12-03 (×12): qty 1

## 2017-12-03 MED ORDER — ACETAMINOPHEN 325 MG PO TABS
650.0000 mg | ORAL_TABLET | ORAL | Status: DC | PRN
  Administered 2017-12-07 – 2017-12-15 (×7): 650 mg via ORAL
  Filled 2017-12-03 (×7): qty 2

## 2017-12-03 NOTE — Progress Notes (Signed)
Call placed to grandson multiple times today, in efforts to confirm patients baseline mentation.   Patient's pocket knife is in envelope at desk, awaiting pick up by family, or will be sent to security this evening.

## 2017-12-03 NOTE — Progress Notes (Signed)
CRITICAL VALUE ALERT  Critical Value:  0.06  Date & Time Notied:  1115  Provider Notified: Crenshaw,MD  Orders Received/Actions taken: None. See MD note regarding no concern/consistency with ACS.

## 2017-12-03 NOTE — Progress Notes (Signed)
Call placed to patient's grandson to get a better idea of patient's mentation. No answer, voicemail left.

## 2017-12-03 NOTE — Progress Notes (Signed)
Patient belongings (bags in room) include: pants, shoes, belt, shirt and coat.  Medium sized blue sharp pocket kniofe sent to SECURITY.  White iphone in black OtterBox case in camouflage belt attacher at bedside.

## 2017-12-03 NOTE — Progress Notes (Signed)
CRITICAL VALUE STICKER  CRITICAL VALUE: Troponin 0.07  RECEIVER (on-site recipient of call): NO CALL RECEIVED discovered by RN.  DATE & TIME NOTIFIED: 1730  MESSENGER (representative from lab): NULL  MD NOTIFIED: Carncelli,MD (Paged)    Callback   TIME OF NOTIFICATION: 1743  RESPONSE: noted.

## 2017-12-03 NOTE — Progress Notes (Addendum)
Per CCMD, pts rhythm changed to atrial flutter at 1419, pts grandson happened to arrive just before this occurred.  Pt denies concerns/new sx, EKG requested.

## 2017-12-03 NOTE — Progress Notes (Signed)
Page to [facility] NP on call to notify of EKG/Rhythm Changes. Otherwise pt reports the same.

## 2017-12-03 NOTE — Progress Notes (Signed)
Pt is sleepy and easily awakened with stable vitals. 3 l of o2. incontinent condom cath applied. From home with grandson plan to continue diurese and need clarification and beta blocker and tolerance.

## 2017-12-03 NOTE — Progress Notes (Signed)
Progress Note  Patient Name: Victor [name] Date of Encounter: 12/03/2017  Primary Cardiologist: Peter Martinique, MD   Subjective   Pt continues to complain of chest pain and diffuse abdominal pain (continuous since Wed); mild dyspnea.  Inpatient Medications    Scheduled Meds: . aspirin EC  81 mg Oral QHS  . bisoprolol  2.5 mg Oral Daily  . clopidogrel  75 mg Oral Daily  . docusate sodium  100 mg Oral QHS  . DULoxetine  60 mg Oral Daily  . furosemide  40 mg Oral BID  . insulin aspart  0-15 Units Subcutaneous TID WC  . insulin aspart  0-5 Units Subcutaneous QHS  . Insulin Detemir  30 Units Subcutaneous q morning - 10a  . losartan  25 mg Oral Daily  . pravastatin  40 mg Oral q1800  . pregabalin  150 mg Oral Daily  . spironolactone  12.5 mg Oral Daily   Continuous Infusions:  PRN Meds: acetaminophen, nitroGLYCERIN, ondansetron (ZOFRAN) IV, traMADol   Vital Signs    Vitals:   12/03/17 0321 12/03/17 0326 12/03/17 0328  BP: (!) 146/79 (!) 146/79   Pulse: (!) 108 (!) 108   Resp:  18   Temp: 98.4 F (36.9 C) 98.4 F (36.9 C)   TempSrc: Oral Oral   SpO2: 96% 92% 96%  Weight: 159 lb 13.3 oz (72.5 kg) 158 lb 15.2 oz (72.1 kg)   Height: 5\' 8"  (1.727 m) 5\' 8"  (1.727 m)     Intake/Output Summary (Last 24 hours) at 12/03/2017 1145 Last data filed at 12/03/2017 0915 Gross per 24 hour  Intake 0 ml  Output -  Net 0 ml   Filed Weights   12/03/17 0321 12/03/17 0326  Weight: 159 lb 13.3 oz (72.5 kg) 158 lb 15.2 oz (72.1 kg)    Telemetry    sinus- Personally Reviewed  Physical Exam   GEN: Elderly, complains of chest and abdominal pain Neck: No JVD Cardiac: Mildly tachycardic Respiratory: Clear to auscultation bilaterally. Tender to palpation over left chest GI: soft, mild diffuse tenderness; no rebound MS: No edema Neuro:  Nonfocal    Labs    Chemistry Recent Labs  Lab 12/03/17 0902  NA 133*  K 4.0  CL 93*  CO2 27  GLUCOSE 288*  BUN 29*  CREATININE 1.21   CALCIUM 9.1  PROT 6.6  ALBUMIN 3.6  AST 19  ALT 16*  ALKPHOS 79  BILITOT 0.8  GFRNONAA 55*  GFRAA >60  ANIONGAP 13     Hematology Recent Labs  Lab 12/03/17 0902  WBC 24.1*  RBC 4.54  HGB 12.7*  HCT 37.7*  MCV 83.0  MCH 28.0  MCHC 33.7  RDW 15.1  PLT 164    Cardiac Enzymes Recent Labs  Lab 12/03/17 0902  TROPONINI 0.06*    Radiology    Dg Chest 2 View  Result Date: 12/03/2017 CLINICAL DATA:  Left upper chest pain.  Previous rib fractures EXAM: CHEST  2 VIEW COMPARISON:  12/02/2017 FINDINGS: CABG. Left AICD remains in place, unchanged. There is cardiomegaly. Low lung volumes. Left base atelectasis. No effusion or pneumothorax. IMPRESSION: Low lung volumes.  Left base atelectasis. Cardiomegaly. Electronically Signed   By: Rolm Baptise M.D.   On: 12/03/2017 07:13   US Abdomen Limited  Result Date: 12/03/2017 CLINICAL DATA:  Left anterior abdominal wall fluid collection seen on CT EXAM: ULTRASOUND ABDOMEN LIMITED COMPARISON:  Chest CT 12/02/2017 FINDINGS: As noted on CT, there is a fluid collection  in the anterior abdominal wall surrounding the pacer wires. The fluid collection measures 9.1 x 5.0 x 2.4 cm. Internal echoes noted. IMPRESSION: Complex fluid collection noted around the pacer wires in the anterior abdominal wall measuring up to 9 cm. Electronically Signed   By: Rolm Baptise M.D.   On: 12/03/2017 07:12    Patient Profile     82 y.o. male with past medical history of coronary artery disease status post coronary artery bypass graft, chronic combined systolic/diastolic congestive heart failure, ischemic cardiomyopathy, history of ventricular tachycardia status post ICD, hypertension, diabetes mellitus, chronic stage III kidney disease, peripheral arterial disease admitted with continuous chest pain and abdominal pain.  Troponin minimally elevated.  Assessment & Plan    1 chest pain-pain has been persistent for 5 days.  It increases with certain movements.  He  is tender to palpation over his chest.  Possibly musculoskeletal.  Troponin is 0.06 and I do not think consistent with acute coronary syndrome.   2 abdominal pain-etiology unclear.  Apparently outside CT showed no significant pathology.  Will check liver functions, amylase and lipase.  Note patient's white blood cell count is 24,000.  He is not febrile.  If he develops fever will need to culture.  He is not complaining of nausea, vomiting, melena, hematochezia or diarrhea.  Will need to follow closely.  May need GI evaluation. Repeat CBC in AM.  3 coronary artery disease-plan continue medical therapy with aspirin, Plavix and statin.  4 ischemic cardiomyopathy-continue ARB and beta-blocker.  5 fluid collection surrounding wires of epicardial pacer noted on CT scan-not convinced this is the cause of symptoms at this point.  For questions or updates, please contact [place] Please consult [email or web address] for contact info under Cardiology/STEMI.      Signed, Kirk Ruths, MD  12/03/2017, 11:45 AM

## 2017-12-03 NOTE — Progress Notes (Signed)
Patient's grandson called back, reports baseline confusion. Pt has a fluctuation in confusion, over the last year.   He also reports pt having a cough at home, that the cough he is presenting with today is likely not new.   Victor [name], should be here in one hour to visit.

## 2017-12-03 NOTE — Progress Notes (Signed)
Notified Dr. Lamona Curl of ekg result.

## 2017-12-03 NOTE — H&P (Signed)
History and Physical  Primary Cardiologist:[place] PCP: Cyndi Bender, PA-C  Chief Complaint: Chest pain, + trop transfer from [place] is a 82 y.o. male with a hx of CAD s/p CABG in 1990 with known occlusion of both vein grafts, PCI to SVG-OM3 in 2014 and attempted CTO PCI of RCA 09/2014, chronic combined systoic and diastolic heart failure, ischemic cardiomyopathy with history of VT in 2012 s/p ICD, DM, CKD, HTN, HLD, PAD, and carotid stenosis transferred from [facility] ED for chest pain and + troponin.  Patient lives in [place], alone.  He was recently admitted due to shortness of breath WITHOUT chest pain 12/19-12/20 and treated for CHF.  At that time, Troponin elevated to 1.7, EF 45-50%.  He was managed for CHF, no ischemic eval per Dr. Martinique.  Today, presents with near constant chest pressure 7/10 to [place].  There is MSK component to his symptoms- better laying forward and when walking.  Not A/W N/V, diaphoresis.  He took 4 SL NTG without benefit.     At [facility], got CT C/A/P without dissection/PE.  Did note fluid collection or seroma (2.6 x 8.7 cm loculated collection-- see below for screen-grab) surrounding the wires of the epicardial pacer in the left anterior abdominal wall anterior to the rectus sheath.    Troponin returned elevated x2 (0.049 ULN <.034) and given continued chest pain and mild elevated trop with cardiac history, transferred to [facility] for further consideration of symptoms.  Already on ASA/clopidogrel and on heparin gtt.    On interview, drowsy, complains of continued abdominal chest pain which is very tender to palpation in the area of the above noted fluid collection.  Denies SOB, bleeding episodes, recent issues with edema more than usual. ______________________________ CT Chest Abd Pelvis 1/12 1. No acute intrathoracic, abdominal, or pelvic pathology. No aortic dissection or aneurysm. No pulmonary embolism identified.  2.  Cardiomegaly with coronary vascular calcification.  3. Advanced Aortic Atherosclerosis (ICD10-I70.0).  4. Severe atherosclerotic calcification of the origin and proximal segment of the SMA with presence of flow . Atherosclerotic calcification of the origin of the celiac axis and renal arteries.  5. Cholelithiasis.  6. No bowel obstruction or active inflammation. Normal appendix.  7. Fluid collection or seroma (2.6 x 8.7 cm loculated collection) surrounding the wires of the epicardial pacer in the left anterior abdominal wall anterior to the rectus sheath:   ECHO 07/18/17 Study Conclusions - Left ventricle: The cavity size was normal. Wall thickness was   increased in a pattern of mild LVH. Systolic function was   moderately reduced. The estimated ejection fraction was in the   range of 35% to 40%. There is akinesis of the basal-midinferior   myocardium. - Mitral valve: Calcified annulus. There was mild regurgitation. - Left atrium: The atrium was severely dilated. - Right ventricle: Pacer wire or catheter noted in right ventricle. - Tricuspid valve: There was moderate regurgitation. - Pulmonary arteries: Systolic pressure was mildly to moderately   increased. PA peak pressure: 49 mm Hg (S).  Past Medical History:  Diagnosis Date  . Anemia   . Arthritis   . Automatic implantable cardioverter-defibrillator in situ 2012  . Cardiomyopathy, ischemic    a. s/p ICD placement  . Chronic combined systolic and diastolic CHF (congestive heart failure) ([place])   . Chronic lower back pain   . Chronic neck pain   . CKD (chronic kidney disease), stage III ([place])   . COPD (chronic obstructive pulmonary disease) (  [place])   . Coronary artery disease    a. s/p CABG 1990. b. multiple PCIs since that time including CTO angioplasty (without stenting) PCI of RCA 09/2014, with last cath 02/2015 done for worsening CHF with unsuccessful attempt at PCI of the RCA due to inability to cross occlusion - medical therapy  advised at that time.  Marland Kitchen GERD (gastroesophageal reflux disease)   . History of blood transfusion ~ 05/2017   "got 4 units"  . HLD (hyperlipidemia)   . Hypertension   . NSTEMI (non-ST elevated myocardial infarction) ([place]) 10/2017   Archie Endo 11/08/2017  . Occlusion and stenosis of carotid artery without mention of cerebral infarction   . On home oxygen therapy    "2L; 24/7" (11/08/2017)  . Orthostatic hypotension   . Paroxysmal ventricular tachycardia ([place])   . Peripheral vascular disease ([place])   . Raynaud's syndrome   . Stroke [facility])    a. CT head 06/2015: Small old lacunar infarct LEFT basal ganglia.  . Thrombocytopenia ([place])   . TIA (transient ischemic attack)    a. s/p LHC on 10/01/14   . Type II diabetes mellitus ([place])     Past Surgical History:  Procedure Laterality Date  . ANAL FISTULECTOMY  2000's  . CARDIAC CATHETERIZATION  07/2014  . CARDIAC CATHETERIZATION  10/01/2014   Procedure: CORONARY BALLOON ANGIOPLASTY;  Surgeon: Peter M Martinique, MD;  Location: [facility] CATH LAB;  Service: Cardiovascular;;  . CARDIAC DEFIBRILLATOR PLACEMENT  02/1989-12/2011   "I've had a total of 6 or 7 defibrillators put in" (09/30/2014)  . CATARACT EXTRACTION, BILATERAL Bilateral ~ 2009  . CORONARY ANGIOPLASTY  10/01/14   PTVA to CTO with restoration of TIMI 3  . CORONARY ANGIOPLASTY WITH STENT PLACEMENT  04/2013   "1"  . CORONARY ARTERY BYPASS GRAFT  01/1989   "CABG X3"   . FINGER FRACTURE SURGERY Left ~ 2000   "pointer"  . FRACTURE SURGERY    . IMPLANTABLE CARDIOVERTER DEFIBRILLATOR (ICD) GENERATOR CHANGE Left 10/05/2011   Procedure: ICD GENERATOR CHANGE;  Surgeon: Evans Lance, MD;  Location: [facility] CATH LAB;  Service: Cardiovascular;  Laterality: Left;  . LEFT AND RIGHT HEART CATHETERIZATION WITH CORONARY/GRAFT ANGIOGRAM N/A 04/26/2013   Procedure: LEFT AND RIGHT HEART CATHETERIZATION WITH Beatrix Fetters;  Surgeon: Sherren Mocha, MD;  Location: [facility] CATH LAB;  Service: Cardiovascular;   Laterality: N/A;  . LEFT HEART CATHETERIZATION WITH CORONARY ANGIOGRAM N/A 08/01/2014   Procedure: LEFT HEART CATHETERIZATION WITH CORONARY ANGIOGRAM;  Surgeon: Sinclair Grooms, MD;  Location: [facility] CATH LAB;  Service: Cardiovascular;  Laterality: N/A;  . LEFT HEART CATHETERIZATION WITH CORONARY ANGIOGRAM N/A 03/20/2015   Procedure: LEFT HEART CATHETERIZATION WITH CORONARY ANGIOGRAM;  Surgeon: Peter M Martinique, MD;  Location: [facility] CATH LAB;  Service: Cardiovascular;  Laterality: N/A;  . PERCUTANEOUS STENT INTERVENTION  04/26/2013   Procedure: PERCUTANEOUS STENT INTERVENTION;  Surgeon: Sherren Mocha, MD;  Location: [facility] CATH LAB;  Service: Cardiovascular;;  . RIGHT HEART CATHETERIZATION  10/01/2014   Procedure: RIGHT HEART CATH;  Surgeon: Peter M Martinique, MD;  Location: [facility] CATH LAB;  Service: Cardiovascular;;    Family History  Problem Relation Age of Onset  . Heart failure Mother 22  . Heart attack Father   . Leukemia Sister   . Stomach cancer Maternal Grandfather    Social History:  reports that he has quit smoking. His smoking use included pipe. He quit after 3.00 years of use. he has never used smokeless tobacco. He reports that he drinks  alcohol. He reports that he does not use drugs.  Allergies:  Allergies  Allergen Reactions  . Coreg [Carvedilol] Other (See Comments)    Shortness of breath ,fatigue ,dizzyness   . Lovastatin Other (See Comments)    CK elevation, Myalgias  . Ativan [Lorazepam] Other (See Comments)    Pt. had side effects    No current facility-administered medications on file prior to encounter.    Current Outpatient Medications on File Prior to Encounter  Medication Sig Dispense Refill  . aspirin (ASPIR-81) 81 MG EC tablet Take 81 mg by mouth at bedtime.     . bisoprolol (ZEBETA) 5 MG tablet Take 0.5 tablets (2.5 mg total) by mouth daily. 90 tablet 3  . clopidogrel (PLAVIX) 75 MG tablet Take 1 tablet (75 mg total) by mouth daily. 90 tablet 3  . docusate sodium  (COLACE) 100 MG capsule Take 1 capsule (100 mg total) by mouth at bedtime. 10 capsule 0  . DULoxetine (CYMBALTA) 60 MG capsule Take 60 mg by mouth daily.  3  . furosemide (LASIX) 40 MG tablet Take 1 tablet (40 mg total) by mouth 2 (two) times daily. 60 tablet 0  . HUMALOG KWIKPEN 100 UNIT/ML KiwkPen   5  . LEVEMIR FLEXTOUCH 100 UNIT/ML Pen INJECT 30 UNITS UNDER SKIN EACH MORNING  1  . losartan (COZAAR) 50 MG tablet Take 0.5 tablets (25 mg total) by mouth daily. 90 tablet 3  . LYRICA 150 MG capsule Take 150 mg by mouth daily.   1  . mupirocin ointment (BACTROBAN) 2 % Apply 1 application topically 2 (two) times daily.  3  . nitroGLYCERIN (NITROSTAT) 0.4 MG SL tablet Place 0.4 mg under the tongue every 5 (five) minutes as needed for chest pain.    . pravastatin (PRAVACHOL) 40 MG tablet Take 1 tablet (40 mg total) by mouth daily at 6 PM. 30 tablet 0  . spironolactone (ALDACTONE) 25 MG tablet Take 0.5 tablets (12.5 mg total) by mouth daily. 30 tablet 0  . traMADol (ULTRAM) 50 MG tablet Take 50 mg by mouth 3 (three) times daily as needed for pain.  2  . traZODone (DESYREL) 50 MG tablet Take 1 tablet by mouth at bedtime.  1  . vitamin B-12 (CYANOCOBALAMIN) 1000 MCG tablet Take 1,000 mcg by mouth daily.      Results for orders placed or performed during the hospital encounter of 12/03/17 (from the past 48 hour(s))  Glucose, capillary     Status: Abnormal   Collection Time: 12/03/17  3:25 AM  Result Value Ref Range   Glucose-Capillary 179 (H) 65 - 99 mg/dL   No results found.  ECG/Tele: pending here, but at [place], NSR with inferiorQW  ROS: As above. Otherwise, review of systems is negative unless per above HPI  Vitals:   12/03/17 0326  BP: (!) 146/79  Pulse: (!) 108  Resp: 18  Temp: 98.4 F (36.9 C)  TempSrc: Oral  SpO2: 92%  Weight: 72.1 kg (158 lb 15.2 oz)  Height: 5\' 8"  (1.727 m)   Wt Readings from Last 10 Encounters:  12/03/17 72.1 kg (158 lb 15.2 oz)  11/09/17 73.3 kg (161 lb  9.6 oz)  09/14/17 74.5 kg (164 lb 3.2 oz)  09/05/17 73.5 kg (162 lb)  07/20/17 69.3 kg (152 lb 12.8 oz)  05/23/17 71.2 kg (157 lb)  01/27/17 81.5 kg (179 lb 9.6 oz)  10/27/16 81.4 kg (179 lb 6.4 oz)  09/23/16 83.6 kg (184 lb 3.2 oz)  09/16/16  81.8 kg (180 lb 6.4 oz)    PE:  General: No acute distress, drowsy HEENT: Atraumatic, EOMI, mucous membranes moist. No JVD at 45 degrees. No HJR. CV: RRR, 2/6 SEM, distant, no appreciable gallops.  Respiratory: Clear, no crackles. Normal work of breathing ABD: tender LUQ to palpation.  Extremities: 2+ radial pulses bilaterally. 0 edema. Neuro/Psych: CN grossly intact, drowsy Assessment/Plan LUQ fluid collection + Troponin/Chest Pain CAD s/p CABG in 1990 with known occlusion of both vein grafts, PCI to SVG-OM3 in 2014 and attempted CTO PCI of RCA 09/2014 Chronic combined systoic and diastolic heart failure Ischemic cardiomyopathy with history of VT in 2012 s/p ICD DM CKD HTN HLD PAD  LUQ/Chest pain with fluid collection around old epicardial wires; mildly elevated troponin at OSH - I do not think this represents ACS and have held his heparin drip given the fluid collection-  This area very tender to palpation - Abdominal ultrasound ordered; I've asked nursing to upload the CD with the CT scan for our review - trend troponin - Continue home ASA/clopdiogrel  History of ischemic cardiomyopathy, systolic and diastolic CHF not in acute exacerbation - Continue home bisoprolol, lasix, losartan, spironolactone  DM - Levemir 30 U QD, ACHS SSI  HLD - Home pravastatin  Full Code  Lolita Cram Means  MD 12/03/2017, 4:18 AM

## 2017-12-03 NOTE — Progress Notes (Signed)
Per pt request, grandson Karle Starch [phone] was called and left message that grandfather was on 3E and left both [place] and room phone numbers.

## 2017-12-04 ENCOUNTER — Encounter (HOSPITAL_COMMUNITY): Payer: Self-pay | Admitting: Nurse Practitioner

## 2017-12-04 DIAGNOSIS — R079 Chest pain, unspecified: Secondary | ICD-10-CM

## 2017-12-04 DIAGNOSIS — I130 Hypertensive heart and chronic kidney disease with heart failure and stage 1 through stage 4 chronic kidney disease, or unspecified chronic kidney disease: Secondary | ICD-10-CM

## 2017-12-04 DIAGNOSIS — Z79891 Long term (current) use of opiate analgesic: Secondary | ICD-10-CM

## 2017-12-04 DIAGNOSIS — Z951 Presence of aortocoronary bypass graft: Secondary | ICD-10-CM

## 2017-12-04 DIAGNOSIS — Z87891 Personal history of nicotine dependence: Secondary | ICD-10-CM

## 2017-12-04 DIAGNOSIS — R188 Other ascites: Secondary | ICD-10-CM

## 2017-12-04 DIAGNOSIS — J449 Chronic obstructive pulmonary disease, unspecified: Secondary | ICD-10-CM

## 2017-12-04 DIAGNOSIS — Z8673 Personal history of transient ischemic attack (TIA), and cerebral infarction without residual deficits: Secondary | ICD-10-CM

## 2017-12-04 DIAGNOSIS — Z9981 Dependence on supplemental oxygen: Secondary | ICD-10-CM

## 2017-12-04 DIAGNOSIS — E1122 Type 2 diabetes mellitus with diabetic chronic kidney disease: Secondary | ICD-10-CM

## 2017-12-04 DIAGNOSIS — E785 Hyperlipidemia, unspecified: Secondary | ICD-10-CM

## 2017-12-04 DIAGNOSIS — N183 Chronic kidney disease, stage 3 (moderate): Secondary | ICD-10-CM

## 2017-12-04 DIAGNOSIS — I471 Supraventricular tachycardia: Secondary | ICD-10-CM

## 2017-12-04 DIAGNOSIS — I2510 Atherosclerotic heart disease of native coronary artery without angina pectoris: Secondary | ICD-10-CM

## 2017-12-04 DIAGNOSIS — I5042 Chronic combined systolic (congestive) and diastolic (congestive) heart failure: Secondary | ICD-10-CM

## 2017-12-04 LAB — SEDIMENTATION RATE: SED RATE: 66 mm/h — AB (ref 0–16)

## 2017-12-04 LAB — BASIC METABOLIC PANEL
Anion gap: 13 (ref 5–15)
BUN: 33 mg/dL — ABNORMAL HIGH (ref 6–20)
CALCIUM: 9 mg/dL (ref 8.9–10.3)
CO2: 25 mmol/L (ref 22–32)
CREATININE: 1.51 mg/dL — AB (ref 0.61–1.24)
Chloride: 93 mmol/L — ABNORMAL LOW (ref 101–111)
GFR calc Af Amer: 49 mL/min — ABNORMAL LOW (ref >60)
GFR calc non Af Amer: 42 mL/min — ABNORMAL LOW (ref >60)
GLUCOSE: 281 mg/dL — AB (ref 65–99)
Potassium: 3.8 mmol/L (ref 3.5–5.1)
Sodium: 131 mmol/L — ABNORMAL LOW (ref 135–145)

## 2017-12-04 LAB — GLUCOSE, CAPILLARY
GLUCOSE-CAPILLARY: 283 mg/dL — AB (ref 65–99)
GLUCOSE-CAPILLARY: 201 mg/dL — AB (ref 65–99)
Glucose-Capillary: 279 mg/dL — ABNORMAL HIGH (ref 65–99)
Glucose-Capillary: 396 mg/dL — ABNORMAL HIGH (ref 65–99)
Glucose-Capillary: 205 mg/dL — ABNORMAL HIGH (ref 65–99)

## 2017-12-04 LAB — CBC
HEMATOCRIT: 35 % — AB (ref 39.0–52.0)
Hemoglobin: 11.2 g/dL — ABNORMAL LOW (ref 13.0–17.0)
MCH: 26.9 pg (ref 26.0–34.0)
MCHC: 32 g/dL (ref 30.0–36.0)
MCV: 83.9 fL (ref 78.0–100.0)
Platelets: 129 10*3/uL — ABNORMAL LOW (ref 150–400)
RBC: 4.17 MIL/uL — ABNORMAL LOW (ref 4.22–5.81)
RDW: 15 % (ref 11.5–15.5)
WBC: 25.2 10*3/uL — ABNORMAL HIGH (ref 4.0–10.5)

## 2017-12-04 MED ORDER — HYDROCOD POLST-CPM POLST ER 10-8 MG/5ML PO SUER: 5.0000 mL | Freq: Four times a day (QID) | ORAL | Status: DC | PRN

## 2017-12-04 NOTE — Progress Notes (Signed)
Pt was give Prn tramadol for left lower chest wall pain that is on going and was noted by MD cardiologist yesterday during morning rounding.Marland Kitchen

## 2017-12-04 NOTE — Progress Notes (Signed)
Pt is sleepy and drowsy but easily arousal, alert and oriented times 3, forgetful at times, vitals stable, denies SOB and distress, oxygen continue via [place] @3l /min (chronic), will continue to monitor the patient

## 2017-12-04 NOTE — Progress Notes (Signed)
  Subjective: Patient examined, CT scan of chest and abdomen images personally reviewed and discussed with patient  82 year old debilitated male status post CABG in 1990 at [place] with a combined epicardial AICD system. The generator was placed in left upper quadrant. Since that time the AICD has been transitioned to transvenous with the generator in the left infraclavicular region.  The patient presents with pain in his left lower chest and upper abdomen and white count without fever. He states proximally a week before the onset of symptoms he sustained trauma to that same area when his 40 pound dog took running jump and hit him at full force. The patient is on Plavix chronically for his CAD. He states his bypass grafts are mostly occluded and he has had stents placed in the past.  On exam there is no evidence of tenderness or infection of the epicardial AICD system. There is no evidence of rib fractures. The patient states his pain is significantly worse when he coughs.  Patient may have a rectus hematoma from the impact sustained by the jumping dog.  I agree with holding on antibiotics. I have ordered a CT guided aspirate of the fluid pocket for culture.  Objective: Vital signs in last 24 hours: Temp:  [98.1 F (36.7 C)-98.4 F (36.9 C)] 98.1 F (36.7 C) (01/14 0532) Pulse Rate:  [96-120] 120 (01/14 0856) Cardiac Rhythm: Heart block;Bundle branch block (01/14 0700) Resp:  [18] 18 (01/14 0532) BP: (120-127)/(51-54) 120/54 (01/14 0856) SpO2:  [96 %-99 %] 96 % (01/14 0856) Weight:  [158 lb 8.2 oz (71.9 kg)] 158 lb 8.2 oz (71.9 kg) (01/14 0217)  Hemodynamic parameters for last 24 hours:    Intake/Output from previous day: 01/13 0701 - 01/14 0700 In: 120 [P.O.:120] Out: 1000 [Urine:1000] Intake/Output this shift: No intake/output data recorded.  Elderly debilitated male no acute distress Neck without JVD or adenopathy Lungs clear No chest deformity, well-healed sternal incision.  No evidence of AICD pocket infection Abdomen soft Extremities warm with mild edema No focal neuro deficit  Lab Results: Recent Labs    12/03/17 0902 12/04/17 0637  WBC 24.1* 25.2*  HGB 12.7* 11.2*  HCT 37.7* 35.0*  PLT 164 129*   BMET:  Recent Labs    12/03/17 0902 12/04/17 0637  NA 133* 131*  K 4.0 3.8  CL 93* 93*  CO2 27 25  GLUCOSE 288* 281*  BUN 29* 33*  CREATININE 1.21 1.51*  CALCIUM 9.1 9.0    PT/INR: No results for input(s): LABPROT, INR in the last 72 hours. ABG    Component Value Date/Time   PHART 7.415 03/20/2015 1635   HCO3 30.0 (H) 03/20/2015 1638   TCO2 32 03/20/2015 1638   O2SAT 59.0 03/20/2015 1638   CBG (last 3)  Recent Labs    12/04/17 0751 12/04/17 1135 12/04/17 1630  GLUCAP 279* 396* 283*    Assessment/Plan: Probable traumatic etiology of the patient's pain Will see what CT-guided aspirate  reveals.    LOS: 1 day    Tharon Aquas Trigt III 12/04/2017

## 2017-12-04 NOTE — Consult Note (Signed)
   [facility] CM Inpatient Consult   12/04/2017  Victor [name] 12-28-1935 [id number]  Patient discussed in the progression meeting this morning.  Patient is in the Medicare ACO.  Assessed for re-admission. Patient admitted with chest pain and with a HX of and not limited to coronary artery disease with s/p CABG, chronic dialstolic and systolic HF, also HX of ICD for ventricular tachycardia. Patient's primary care provider is Cyndi Bender, PA-C and from [place].  Will follow for disposition and needs.  Inpatient RNCM is aware of [facility].   For questions, please contact:  Natividad Brood, RN BSN [place] Liaison  [phone] business mobile phone Toll free office [phone]

## 2017-12-04 NOTE — Progress Notes (Addendum)
Progress Note  Patient Name: Victor [name] Date of Encounter: 12/04/2017  Primary Cardiologist: Peter Martinique, MD   Subjective   Breathing is OK  CP L   Worst if push   Inpatient Medications    Scheduled Meds: . aspirin EC  81 mg Oral QHS  . bisoprolol  2.5 mg Oral Daily  . clopidogrel  75 mg Oral Daily  . docusate sodium  100 mg Oral QHS  . DULoxetine  60 mg Oral Daily  . furosemide  40 mg Oral BID  . insulin aspart  0-15 Units Subcutaneous TID WC  . insulin aspart  0-5 Units Subcutaneous QHS  . insulin detemir  30 Units Subcutaneous q morning - 10a  . losartan  25 mg Oral Daily  . pravastatin  40 mg Oral q1800  . pregabalin  150 mg Oral Daily  . spironolactone  12.5 mg Oral Daily   Continuous Infusions:  PRN Meds: acetaminophen, nitroGLYCERIN, ondansetron (ZOFRAN) IV, traMADol   Vital Signs    Vitals:   12/03/17 1926 12/04/17 0217 12/04/17 0532 12/04/17 0856  BP: (!) 127/51  (!) 126/52 (!) 120/54  Pulse: 96  (!) 102 (!) 120  Resp: 18  18   Temp: 98.4 F (36.9 C)  98.1 F (36.7 C)   TempSrc: Oral  Oral   SpO2: 99%  97% 96%  Weight:  158 lb 8.2 oz (71.9 kg)    Height:        Intake/Output Summary (Last 24 hours) at 12/04/2017 0926 Last data filed at 12/03/2017 1751 Gross per 24 hour  Intake 120 ml  Output 1000 ml  Net -880 ml   Filed Weights   12/03/17 0321 12/03/17 0326 12/04/17 0217  Weight: 159 lb 13.3 oz (72.5 kg) 158 lb 15.2 oz (72.1 kg) 158 lb 8.2 oz (71.9 kg)    Telemetry    Sinus rhythm with 1 min SVT   Personally Reviewed  Physical Exam   GEN: Elderly, complains of chest and abdominal pain Neck: No JVD Cardiac: Mildly tachycardic Respiratory: Clear to auscultation bilaterally. Chest :   Tender to palpation over left chest  Pacer site OK   GI: soft, mild diffuse tenderness; no rebound MS: No edema Neuro:  Nonfocal    Labs    Chemistry Recent Labs  Lab 12/03/17 0902 12/03/17 1538 12/04/17 0637  NA 133*  --  131*  K 4.0  --   3.8  CL 93*  --  93*  CO2 27  --  25  GLUCOSE 288*  --  281*  BUN 29*  --  33*  CREATININE 1.21  --  1.51*  CALCIUM 9.1  --  9.0  PROT 6.6 6.1*  --   ALBUMIN 3.6 3.1*  --   AST 19 15  --   ALT 16* 13*  --   ALKPHOS 79 69  --   BILITOT 0.8 0.9  --   GFRNONAA 55*  --  42*  GFRAA >60  --  49*  ANIONGAP 13  --  13     Hematology Recent Labs  Lab 12/03/17 0902 12/04/17 0637  WBC 24.1* 25.2*  RBC 4.54 4.17*  HGB 12.7* 11.2*  HCT 37.7* 35.0*  MCV 83.0 83.9  MCH 28.0 26.9  MCHC 33.7 32.0  RDW 15.1 15.0  PLT 164 129*    Cardiac Enzymes Recent Labs  Lab 12/03/17 0902 12/03/17 1538  TROPONINI 0.06* 0.07*    Radiology    Dg Chest 2  View  Result Date: 12/03/2017 CLINICAL DATA:  Left upper chest pain.  Previous rib fractures EXAM: CHEST  2 VIEW COMPARISON:  12/02/2017 FINDINGS: CABG. Left AICD remains in place, unchanged. There is cardiomegaly. Low lung volumes. Left base atelectasis. No effusion or pneumothorax. IMPRESSION: Low lung volumes.  Left base atelectasis. Cardiomegaly. Electronically Signed   By: Rolm Baptise M.D.   On: 12/03/2017 07:13   US Abdomen Limited  Result Date: 12/03/2017 CLINICAL DATA:  Left anterior abdominal wall fluid collection seen on CT EXAM: ULTRASOUND ABDOMEN LIMITED COMPARISON:  Chest CT 12/02/2017 FINDINGS: As noted on CT, there is a fluid collection in the anterior abdominal wall surrounding the pacer wires. The fluid collection measures 9.1 x 5.0 x 2.4 cm. Internal echoes noted. IMPRESSION: Complex fluid collection noted around the pacer wires in the anterior abdominal wall measuring up to 9 cm. Electronically Signed   By: Rolm Baptise M.D.   On: 12/03/2017 07:12    Patient Profile     82 y.o. male with past medical history of coronary artery disease status post coronary artery bypass graft, chronic combined systolic/diastolic congestive heart failure, ischemic cardiomyopathy, history of ventricular tachycardia status post ICD, hypertension,  diabetes mellitus, chronic stage III kidney disease, peripheral arterial disease admitted with continuous chest pain and abdominal pain.  Troponin minimally elevated.  Assessment & Plan    1 chest pain-Atypical for ischemia  More muscular  Worse with pushing on chest   Says his dog jumped on him (small dog)  Not sure how it is in relation to fluid collection   2 abdominal pain-etiology unclear.  Deneis now    3 coronary artery disease-plan continue medical therapy with aspirin, Plavix and statin.  4 ischemic cardiomyopathy-continue ARB and beta-blocker.  Volume status is not bad    5  Rhyhtm  No VT  Did have short burst of SVT  WIll review with EP  Has ICD    5 fluid collection surrounding wires of epicardial pacer noted on CT scan-This was not present in CT from 2017   Would be associated with old leads from abdominal /epicardial system   I6  Increased WBG  25.2  Well get ESR  Check UA  Review with EP    For questions or updates, please contact [facility] Please consult [email or web address] for contact info under Cardiology/STEMI.      Signed, Dorris Carnes, MD  12/04/2017, 9:26 AM

## 2017-12-04 NOTE — Consult Note (Signed)
[facility]    Date of Admission:  12/03/2017          Reason for Consult: Abdominal wall fluid collection around old epicardial pacing leads    Referring Provider: Dr. Allegra Lai  Assessment: I cannot be certain what is causing the abdominal wall fluid collection around his old epicardial leads.  Certainly infection is a possibility.  Other than leukocytosis he is not showing any other signs of local or systemic inflammation and I do not favor starting empiric antibiotics at this time.  If he becomes clinically unstable with more signs of IV vancomycin.  Cardiothoracic surgery consultation is pending.  If possible I would consider aspiration of the fluid with specimen sent for Gram stain and culture.  Plan: 1. Observe off of antibiotics for now  Principal Problem:   Abdominal wall fluid collections Active Problems:   Chest pain   Scheduled Meds: . aspirin EC  81 mg Oral QHS  . bisoprolol  2.5 mg Oral Daily  . clopidogrel  75 mg Oral Daily  . docusate sodium  100 mg Oral QHS  . DULoxetine  60 mg Oral Daily  . furosemide  40 mg Oral BID  . insulin aspart  0-15 Units Subcutaneous TID WC  . insulin aspart  0-5 Units Subcutaneous QHS  . insulin detemir  30 Units Subcutaneous q morning - 10a  . losartan  25 mg Oral Daily  . pravastatin  40 mg Oral q1800  . pregabalin  150 mg Oral Daily  . spironolactone  12.5 mg Oral Daily   Continuous Infusions: PRN Meds:.acetaminophen, nitroGLYCERIN, ondansetron (ZOFRAN) IV, traMADol  HPI: Victor [name] is a 82 y.o. male with a history of extensive atherosclerosis and coronary artery disease.  He has undergone bypass grafting.  He had an ICD placed in 1997.  That was converted to a transvenous device in 2005 and the abdominal site was abandoned with the epicardial pacing leads in place.  He was recently taken to [facility] with chest pain and transferred here after he had positive troponins.  A CT  scan done before transfer had the incidental finding of fluid in the abdominal wall surrounding the old epicardial pacing leads.   Review of Systems: Review of Systems  Unable to perform ROS: Mental acuity    Past Medical History:  Diagnosis Date  . Anemia   . Arthritis   . Cardiomyopathy, ischemic    a. s/p ICD placement  . Chronic combined systolic and diastolic CHF (congestive heart failure) ([place])   . CKD (chronic kidney disease), stage III ([place])   . COPD (chronic obstructive pulmonary disease) ([place])   . Coronary artery disease    a. s/p CABG 1990. b. multiple PCIs since that time including CTO angioplasty (without stenting) PCI of RCA 09/2014, with last cath 02/2015 done for worsening CHF with unsuccessful attempt at PCI of the RCA due to inability to cross occlusion - medical therapy advised at that time.  Marland Kitchen GERD (gastroesophageal reflux disease)   . HLD (hyperlipidemia)   . Hypertension   . Occlusion and stenosis of carotid artery without mention of cerebral infarction   . On home oxygen therapy    "2L; 24/7" (11/08/2017)  . Orthostatic hypotension   . Paroxysmal ventricular tachycardia ([place])   . Peripheral vascular disease ([place])   . Raynaud's syndrome   . Stroke [facility])    a. CT head 06/2015: Small old lacunar infarct  LEFT basal ganglia.  . Thrombocytopenia ([place])   . TIA (transient ischemic attack)    a. s/p LHC on 10/01/14   . Type II diabetes mellitus ([facility])     Social History   Tobacco Use  . Smoking status: Former Smoker    Years: 3.00    Types: Pipe  . Smokeless tobacco: Never Used  . Tobacco comment: "quit smoking pipe in early 1970's"  Substance Use Topics  . Alcohol use: Yes    Comment: 11/08/2017 "wine w/dinner maybe once/yr; or less"  . Drug use: No    Family History  Problem Relation Age of Onset  . Heart failure Mother 51  . Heart attack Father   . Leukemia Sister   . Stomach cancer Maternal Grandfather    Allergies  Allergen Reactions  .  Coreg [Carvedilol] Other (See Comments)    Shortness of breath ,fatigue ,dizzyness   . Lovastatin Other (See Comments)    CK elevation, Myalgias  . Ativan [Lorazepam] Other (See Comments)    Pt. had side effects    OBJECTIVE: Blood pressure (!) 120/54, pulse (!) 120, temperature 98.1 F (36.7 C), temperature source Oral, resp. rate 18, height 5\' 8"  (1.727 m), weight 158 lb 8.2 oz (71.9 kg), SpO2 96 %.  Physical Exam  Constitutional:  He is very lethargic.  He is very slow to respond to questions he will point to his left chest when I ask where he is hurting.  He does not answer questions.  Cardiovascular:  No murmur heard. Very distant heart sounds.  Pulmonary/Chest: Effort normal. He has no wheezes. He has no rales.  Left anterior chest ICD site looks normal.  Abdominal: Soft. He exhibits no distension. There is no tenderness.  He has a healed incision in his left upper quadrant.  There is some fluctuance above the incision but no erythema, warmth or apparent tenderness.    Lab Results Lab Results  Component Value Date   WBC 25.2 (H) 12/04/2017   HGB 11.2 (L) 12/04/2017   HCT 35.0 (L) 12/04/2017   MCV 83.9 12/04/2017   PLT 129 (L) 12/04/2017    Lab Results  Component Value Date   CREATININE 1.51 (H) 12/04/2017   BUN 33 (H) 12/04/2017   NA 131 (L) 12/04/2017   K 3.8 12/04/2017   CL 93 (L) 12/04/2017   CO2 25 12/04/2017    Lab Results  Component Value Date   ALT 13 (L) 12/03/2017   AST 15 12/03/2017   ALKPHOS 69 12/03/2017   BILITOT 0.9 12/03/2017     Microbiology: No results found for this or any previous visit (from the past 240 hour(s)).  Michel Bickers, MD [facility] [phone] pager   [phone] cell 12/04/2017, 5:01 PM

## 2017-12-04 NOTE — Consult Note (Signed)
ELECTROPHYSIOLOGY CONSULT NOTE    Patient ID: Victor [name] MRN: [id number], DOB/AGE: May 14, 1936 82 y.o.  Admit date: 12/03/2017 Date of Consult: 12/04/2017  Primary Physician: Cyndi Bender, PA-C Primary Cardiologist: [place] Electrophysiologist: Lovena Le  Patient Profile: Victor [name] is a 82 y.o. male with a history of CAD, ICM s/p BSX single chamber ICD, VT, PVD, diabetes, and prior stroke who is being seen today for the evaluation of fluid accumulation around epicardial wires at the request of Dr Harrington Challenger.  HPI:  Victor [name] is a 82 y.o. male with the above past medical history. He has a complicated device history (see below) with original abdominal epicardial ICD system implanted in 1997. He has subsequently had abdominal system abandoned and transvenous ICD system placed in 2005 with last gen change in 2012. I saw him in clinic last fall at which time he was doing relatively well.  He presented to [facility] with complaints of chest pressure. His troponin was elevated and he was transferred to [facility] for further evaluation.  His chest pressure is not exertional, worse with movement, and worse with palpation over left lateral chest.  CT scan at [facility] demonstrated fluid collection or seroma surrounding epicardial wires from abandoned epicardial system. Ultrasound done here confirmed fluid collection around epicardial pacing wires.  WBC is elevated. ESR is also elevated.  He has not had fevers or chills.  He states that he was at home switching out his O2 tanks when his dog jumped on his chest and he thinks that is when pain began.  His pain today is clearly related to movement and palpation. He has also had periods of NCT on telemetry that have been asymptomatic.   Echo 06/2017 demonstrated EF 35-40%, akinesis of basal-midinferior myocardium, LA severely dilated.   He denies chest pain, palpitations, dyspnea, PND, orthopnea, nausea, vomiting, dizziness, syncope, edema, weight gain, or  early satiety.  Device History: BSX abdominal ICD implanted 1997 for VT; abdominal system abandoned 2005 with single chamber transvenous system placed; gen change 2012 History of appropriate therapy: Yes History of AAD therapy: No  Past Medical History:  Diagnosis Date  . Anemia   . Arthritis   . Cardiomyopathy, ischemic    a. s/p ICD placement  . Chronic combined systolic and diastolic CHF (congestive heart failure) ([place])   . CKD (chronic kidney disease), stage III ([place])   . COPD (chronic obstructive pulmonary disease) ([place])   . Coronary artery disease    a. s/p CABG 1990. b. multiple PCIs since that time including CTO angioplasty (without stenting) PCI of RCA 09/2014, with last cath 02/2015 done for worsening CHF with unsuccessful attempt at PCI of the RCA due to inability to cross occlusion - medical therapy advised at that time.  Marland Kitchen GERD (gastroesophageal reflux disease)   . HLD (hyperlipidemia)   . Hypertension   . Occlusion and stenosis of carotid artery without mention of cerebral infarction   . On home oxygen therapy    "2L; 24/7" (11/08/2017)  . Orthostatic hypotension   . Paroxysmal ventricular tachycardia ([place])   . Peripheral vascular disease ([place])   . Raynaud's syndrome   . Stroke [facility])    a. CT head 06/2015: Small old lacunar infarct LEFT basal ganglia.  . Thrombocytopenia ([place])   . TIA (transient ischemic attack)    a. s/p LHC on 10/01/14   . Type II diabetes mellitus ([place])      Surgical History:  Past Surgical History:  Procedure Laterality Date  .  ANAL FISTULECTOMY  2000's  . CARDIAC CATHETERIZATION  10/01/2014   PTVA to CTO with restoration of TIMI 3  . CATARACT EXTRACTION, BILATERAL Bilateral ~ 2009  . CORONARY ARTERY BYPASS GRAFT  01/1989   "CABG X3"   . FINGER FRACTURE SURGERY Left ~ 2000   "pointer"  . IMPLANTABLE CARDIOVERTER DEFIBRILLATOR (ICD) GENERATOR CHANGE Left 10/05/2011   Procedure: ICD GENERATOR CHANGE;  Surgeon: Evans Lance, MD;   Location: [facility] CATH LAB;  Service: Cardiovascular;  Laterality: Left;  . LEFT AND RIGHT HEART CATHETERIZATION WITH CORONARY/GRAFT ANGIOGRAM N/A 04/26/2013   Procedure: LEFT AND RIGHT HEART CATHETERIZATION WITH Beatrix Fetters;  Surgeon: Sherren Mocha, MD;  Location: [facility] CATH LAB;  Service: Cardiovascular;  Laterality: N/A;  . LEFT HEART CATHETERIZATION WITH CORONARY ANGIOGRAM N/A 08/01/2014   Procedure: LEFT HEART CATHETERIZATION WITH CORONARY ANGIOGRAM;  Surgeon: Sinclair Grooms, MD;  Location: [facility] CATH LAB;  Service: Cardiovascular;  Laterality: N/A;  . LEFT HEART CATHETERIZATION WITH CORONARY ANGIOGRAM N/A 03/20/2015   Procedure: LEFT HEART CATHETERIZATION WITH CORONARY ANGIOGRAM;  Surgeon: Peter M Martinique, MD;  Location: [facility] CATH LAB;  Service: Cardiovascular;  Laterality: N/A;  . PERCUTANEOUS STENT INTERVENTION  04/26/2013   Procedure: PERCUTANEOUS STENT INTERVENTION;  Surgeon: Sherren Mocha, MD;  Location[facility] CATH LAB;  Service: Cardiovascular;;  . RIGHT HEART CATHETERIZATION  10/01/2014   Procedure: RIGHT HEART CATH;  Surgeon: Peter M Martinique, MD;  Location: [facility] CATH LAB;  Service: Cardiovascular;;     Medications Prior to Admission  Medication Sig Dispense Refill Last Dose  . aspirin (ASPIR-81) 81 MG EC tablet Take 81 mg by mouth at bedtime.    12/02/2017 at Unknown time  . bisoprolol (ZEBETA) 5 MG tablet Take 0.5 tablets (2.5 mg total) by mouth daily. 90 tablet 3 12/02/2017 at 0900  . CALCIUM PO Take 1 tablet by mouth at bedtime.   12/02/2017 at Unknown time  . clopidogrel (PLAVIX) 75 MG tablet Take 1 tablet (75 mg total) by mouth daily. 90 tablet 3 12/02/2017 at 0900  . Cyanocobalamin (VITAMIN B-12 PO) Take 1 tablet by mouth daily.    12/02/2017 at Unknown time  . dexamethasone (DECADRON) 4 MG tablet Take 4 mg by mouth See admin instructions. Take on Thursdays, Saturdays, and Mondays  2 12/02/2017 at Unknown time  . DULoxetine (CYMBALTA) 30 MG capsule Take 30 mg by mouth daily.   3 12/02/2017  at Unknown time  . furosemide (LASIX) 40 MG tablet Take 1 tablet (40 mg total) by mouth 2 (two) times daily. 60 tablet 0 12/02/2017 at 1900  . HUMALOG KWIKPEN 100 UNIT/ML KiwkPen Inject into the skin 3 (three) times daily. Per sliding scale  5 12/02/2017 at Unknown time  . LEVEMIR FLEXTOUCH 100 UNIT/ML Pen INJECT 30 UNITS UNDER SKIN EACH MORNING  1 12/02/2017 at Unknown time  . losartan (COZAAR) 50 MG tablet Take 0.5 tablets (25 mg total) by mouth daily. 90 tablet 3 12/02/2017 at Unknown time  . LYRICA 150 MG capsule Take 150 mg by mouth daily.   1 12/02/2017 at Unknown time  . nitroGLYCERIN (NITROSTAT) 0.4 MG SL tablet Place 0.4 mg under the tongue every 5 (five) minutes as needed for chest pain.   12/02/2017 at Unknown time  . omeprazole (PRILOSEC) 20 MG capsule Take 20 mg by mouth every morning.  7 12/02/2017 at Unknown time  . pravastatin (PRAVACHOL) 40 MG tablet Take 1 tablet (40 mg total) by mouth daily at 6 PM. 30 tablet 0 12/02/2017 at  Unknown time  . PRESCRIPTION MEDICATION Apply 1 application topically as needed (Neck Pain).   12/02/2017 at Unknown time  . spironolactone (ALDACTONE) 25 MG tablet Take 0.5 tablets (12.5 mg total) by mouth daily. (Patient taking differently: Take 25 mg by mouth daily. ) 30 tablet 0 12/02/2017 at Unknown time  . traMADol (ULTRAM) 50 MG tablet Take 50 mg by mouth 3 (three) times daily as needed for pain.  2 12/02/2017 at Unknown time  . docusate sodium (COLACE) 100 MG capsule Take 1 capsule (100 mg total) by mouth at bedtime. (Patient not taking: Reported on 12/03/2017) 10 capsule 0 Not Taking at Unknown time  . mupirocin ointment (BACTROBAN) 2 % Apply 1 application topically 2 (two) times daily.  3 Not Taking at Unknown time  . traZODone (DESYREL) 50 MG tablet Take 1 tablet by mouth at bedtime.  1 Not Taking at Unknown time    Inpatient Medications:  . aspirin EC  81 mg Oral QHS  . bisoprolol  2.5 mg Oral Daily  . clopidogrel  75 mg Oral Daily  . docusate sodium  100  mg Oral QHS  . DULoxetine  60 mg Oral Daily  . furosemide  40 mg Oral BID  . insulin aspart  0-15 Units Subcutaneous TID WC  . insulin aspart  0-5 Units Subcutaneous QHS  . insulin detemir  30 Units Subcutaneous q morning - 10a  . losartan  25 mg Oral Daily  . pravastatin  40 mg Oral q1800  . pregabalin  150 mg Oral Daily  . spironolactone  12.5 mg Oral Daily    Allergies:  Allergies  Allergen Reactions  . Coreg [Carvedilol] Other (See Comments)    Shortness of breath ,fatigue ,dizzyness   . Lovastatin Other (See Comments)    CK elevation, Myalgias  . Ativan [Lorazepam] Other (See Comments)    Pt. had side effects    Social History   Socioeconomic History  . Marital status: Widowed    Spouse name: Not on file  . Number of children: Not on file  . Years of education: Not on file  . Highest education level: Not on file  Social Needs  . Financial resource strain: Not on file  . Food insecurity - worry: Not on file  . Food insecurity - inability: Not on file  . Transportation needs - medical: Not on file  . Transportation needs - non-medical: Not on file  Occupational History  . Not on file  Tobacco Use  . Smoking status: Former Smoker    Years: 3.00    Types: Pipe  . Smokeless tobacco: Never Used  . Tobacco comment: "quit smoking pipe in early 1970's"  Substance and Sexual Activity  . Alcohol use: Yes    Comment: 11/08/2017 "wine w/dinner maybe once/yr; or less"  . Drug use: No  . Sexual activity: No  Other Topics Concern  . 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 Has 3 daughters. 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      Family History  Problem Relation Age of Onset  . Heart failure Mother 53  . Heart attack Father   . Leukemia Sister   . Stomach cancer Maternal Grandfather      Review of Systems: All other systems reviewed and are otherwise negative  except as noted above.  Physical Exam: Vitals:   12/03/17 1926 12/04/17  0217 12/04/17 0532 12/04/17 0856  BP: (!) 127/51  (!) 126/52 (!) 120/54  Pulse: 96  (!) 102 (!) 120  Resp: 18  18   Temp: 98.4 F (36.9 C)  98.1 F (36.7 C)   TempSrc: Oral  Oral   SpO2: 99%  97% 96%  Weight:  158 lb 8.2 oz (71.9 kg)    Height:        GEN- The patient is chronically ill appearing, alert and oriented x 3 today.   HEENT: normocephalic, atraumatic; sclera clear, conjunctiva pink; hearing intact; oropharynx clear; neck supple Lungs- Clear to ausculation bilaterally, normal work of breathing.  No wheezes, rales, rhonchi Heart- Regular rate and rhythm  GI- soft, non-tender, non-distended, bowel sounds present Extremities- no clubbing, cyanosis, or edema MS- no significant deformity or atrophy Skin- warm and dry, no rash or lesion Psych- euthymic mood, full affect Neuro- strength and sensation are intact  Labs:   Lab Results  Component Value Date   WBC 25.2 (H) 12/04/2017   HGB 11.2 (L) 12/04/2017   HCT 35.0 (L) 12/04/2017   MCV 83.9 12/04/2017   PLT 129 (L) 12/04/2017    Recent Labs  Lab 12/03/17 1538 12/04/17 0637  NA  --  131*  K  --  3.8  CL  --  93*  CO2  --  25  BUN  --  33*  CREATININE  --  1.51*  CALCIUM  --  9.0  PROT 6.1*  --   BILITOT 0.9  --   ALKPHOS 69  --   ALT 13*  --   AST 15  --   GLUCOSE  --  281*      Radiology/Studies: Dg Chest 2 View  Result Date: 12/03/2017 CLINICAL DATA:  Left upper chest pain.  Previous rib fractures EXAM: CHEST  2 VIEW COMPARISON:  12/02/2017 FINDINGS: CABG. Left AICD remains in place, unchanged. There is cardiomegaly. Low lung volumes. Left base atelectasis. No effusion or pneumothorax. IMPRESSION: Low lung volumes.  Left base atelectasis. Cardiomegaly. Electronically Signed   By: Rolm Baptise M.D.   On: 12/03/2017 07:13   US Abdomen Limited  Result Date: 12/03/2017 CLINICAL DATA:  Left anterior abdominal wall fluid collection  seen on CT EXAM: ULTRASOUND ABDOMEN LIMITED COMPARISON:  Chest CT 12/02/2017 FINDINGS: As noted on CT, there is a fluid collection in the anterior abdominal wall surrounding the pacer wires. The fluid collection measures 9.1 x 5.0 x 2.4 cm. Internal echoes noted. IMPRESSION: Complex fluid collection noted around the pacer wires in the anterior abdominal wall measuring up to 9 cm. Electronically Signed   By: Rolm Baptise M.D.   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  Chest wall pain/fluid accumulation around abandoned epicardial pacing wires Fluid accumulation was not present on CT scan from 2017 WBC elevated, ESR also elevated Does not appear to involve current transvenous ICD system No recent subjective fevers or chills I discussed with Dr Lovena Le. Concern is for infection around epicardial wires although would be very unusual to occur this long after implant. We Takeru Bose ask for ID as well as CT surgery to see as well.  May need to consider TEE to rule out infection of transvenous system.  Chancy Claros await input from ID. Blood cultures drawn today  2.  SVT Noted on telemetry Asymptomatic Current device is single chamber  ICD with discriminators programmed on in VT zone Chloris Marcoux follow  Dr Curt Bears to see later today  Signed, Chanetta Marshall 12/04/2017 2:18 PM  I have seen and examined this patient with Chanetta Marshall.  Agree with above, note added to reflect my findings.  On exam, RRR, no murmurs, lungs clear.  Presented to the hospital with chest pain.  Was found on CT scan to have a fluid accumulation around his abandon epicardial pacing wires.  With his elevated white count it is likely that this fluid accumulation is infected.  Would plan to consult ID as well as cardiac surgery for possible drainage.  It is unclear at this point whether or not his  transvenous system is also infected.  Blood cultures are currently pending.  He may require a TEE in the future to further evaluate his leads.  Rankin Coolman M. Cereniti Curb MD 12/04/2017 2:41 PM

## 2017-12-04 NOTE — Progress Notes (Signed)
Inpatient Diabetes Program Recommendations  AACE/ADA: New Consensus Statement on Inpatient Glycemic Control (2015)  Target Ranges:  Prepandial:   less than 140 mg/dL      Peak postprandial:   less than 180 mg/dL (1-2 hours)      Critically ill patients:  140 - 180 mg/dL   Lab Results  Component Value Date   GLUCAP 396 (H) 12/04/2017   HGBA1C 7.1 (H) 07/19/2017    Review of Glycemic Control  Diabetes history: DM2 Outpatient Diabetes medications: Levemir 30 units QAM, Humalog S/S Current orders for Inpatient glycemic control: Levemir 30 units QAM, Novolog 0-15 units tidwc and hs  Started basal insulin this am.  Inpatient Diabetes Program Recommendations:     Need updated HgbA1C Add CHO mod med to heart healthy diet  Will assess glycemic control after insulin given today in am.   Thank you. Lorenda Peck, RD, LDN, CDE Inpatient Diabetes Coordinator [phone]

## 2017-12-05 ENCOUNTER — Inpatient Hospital Stay (HOSPITAL_COMMUNITY): Payer: Medicare Other

## 2017-12-05 LAB — URINALYSIS, COMPLETE (UACMP) WITH MICROSCOPIC
Bilirubin Urine: NEGATIVE
GLUCOSE, UA: 150 mg/dL — AB
HGB URINE DIPSTICK: NEGATIVE
Ketones, ur: NEGATIVE mg/dL
LEUKOCYTES UA: NEGATIVE
NITRITE: NEGATIVE
Protein, ur: 30 mg/dL — AB
SPECIFIC GRAVITY, URINE: 1.012 (ref 1.005–1.030)
pH: 5 (ref 5.0–8.0)

## 2017-12-05 LAB — GLUCOSE, CAPILLARY
GLUCOSE-CAPILLARY: 211 mg/dL — AB (ref 65–99)
Glucose-Capillary: 221 mg/dL — ABNORMAL HIGH (ref 65–99)
Glucose-Capillary: 285 mg/dL — ABNORMAL HIGH (ref 65–99)

## 2017-12-05 LAB — PROTIME-INR
INR: 1.21
Prothrombin Time: 15.2 seconds (ref 11.4–15.2)

## 2017-12-05 MED ORDER — LIDOCAINE HCL (PF) 1 % IJ SOLN
INTRAMUSCULAR | Status: AC
  Filled 2017-12-05: qty 30

## 2017-12-05 NOTE — Progress Notes (Signed)
Progress Note  Patient Name: Victor [name] Date of Encounter: 12/05/2017  Primary Cardiologist: Peter Martinique, MD   Subjective   No sob. C/o pain on left side of chest and upper abdomen  Inpatient Medications    Scheduled Meds: . aspirin EC  81 mg Oral QHS  . bisoprolol  2.5 mg Oral Daily  . clopidogrel  75 mg Oral Daily  . docusate sodium  100 mg Oral QHS  . DULoxetine  60 mg Oral Daily  . furosemide  40 mg Oral BID  . insulin aspart  0-15 Units Subcutaneous TID WC  . insulin aspart  0-5 Units Subcutaneous QHS  . insulin detemir  30 Units Subcutaneous q morning - 10a  . losartan  25 mg Oral Daily  . pravastatin  40 mg Oral q1800  . pregabalin  150 mg Oral Daily  . spironolactone  12.5 mg Oral Daily   Continuous Infusions:  PRN Meds: acetaminophen, chlorpheniramine-HYDROcodone, nitroGLYCERIN, ondansetron (ZOFRAN) IV, traMADol   Vital Signs    Vitals:   12/04/17 0532 12/04/17 0856 12/04/17 2039 12/05/17 0546  BP: (!) 126/52 (!) 120/54 139/69 122/61  Pulse: (!) 102 (!) 120 90 88  Resp: 18  18 18   Temp: 98.1 F (36.7 C)  98.7 F (37.1 C) 98.5 F (36.9 C)  TempSrc: Oral  Oral Oral  SpO2: 97% 96% 99% 97%  Weight:    157 lb 3 oz (71.3 kg)  Height:        Intake/Output Summary (Last 24 hours) at 12/05/2017 0832 Last data filed at 12/05/2017 0531 Gross per 24 hour  Intake 50 ml  Output 1200 ml  Net -1150 ml   Filed Weights   12/03/17 0326 12/04/17 0217 12/05/17 0546  Weight: 158 lb 15.2 oz (72.1 kg) 158 lb 8.2 oz (71.9 kg) 157 lb 3 oz (71.3 kg)    Telemetry    nsr - Personally Reviewed  ECG    none - Personally Reviewed  Physical Exam   GEN: chronically ill appearing, no acute distress.   Neck: 6 cm JVD Cardiac: RRR, no murmurs, rubs, or gallops.  Respiratory: Clear to auscultation bilaterally. GI: Soft, nontender, non-distended  MS: No edema; No deformity. Neuro:  Nonfocal  Psych: Normal affect   Labs    Chemistry Recent Labs  Lab  12/03/17 0902 12/03/17 1538 12/04/17 0637  NA 133*  --  131*  K 4.0  --  3.8  CL 93*  --  93*  CO2 27  --  25  GLUCOSE 288*  --  281*  BUN 29*  --  33*  CREATININE 1.21  --  1.51*  CALCIUM 9.1  --  9.0  PROT 6.6 6.1*  --   ALBUMIN 3.6 3.1*  --   AST 19 15  --   ALT 16* 13*  --   ALKPHOS 79 69  --   BILITOT 0.8 0.9  --   GFRNONAA 55*  --  42*  GFRAA >60  --  49*  ANIONGAP 13  --  13     Hematology Recent Labs  Lab 12/03/17 0902 12/04/17 0637  WBC 24.1* 25.2*  RBC 4.54 4.17*  HGB 12.7* 11.2*  HCT 37.7* 35.0*  MCV 83.0 83.9  MCH 28.0 26.9  MCHC 33.7 32.0  RDW 15.1 15.0  PLT 164 129*    Cardiac Enzymes Recent Labs  Lab 12/03/17 0902 12/03/17 1538  TROPONINI 0.06* 0.07*   No results for input(s): TROPIPOC in the last  168 hours.   BNPNo results for input(s): BNP, PROBNP in the last 168 hours.   DDimer No results for input(s): DDIMER in the last 168 hours.   Radiology    No results found.  Cardiac Studies   none  Patient Profile     82 y.o. male admitted with chest wall pain and found to have a fluid collection along his epicardial ICD lead.   Assessment & Plan    1. Chest wall pain - I have reviewed the CT scan and findings as noted by Dr. PVT. The fluid collection is concerning though agree that hematoma is a possibility. CT guided removal of fluid for culture is indicated. Prognosis dependent on the fluid. Infected epicardial hardware that has been in place almost 30 years would be a challenge to remove. Also, if his epicardial system is involved, it would be likely that the endocardial system also involved.  2. SVT - he has not had any for 2 days. Will follow. 3. VT - he has a h/o VT and has been on amiodarone in the past but this was stopped due to pulmonary issues. 4. Chronic systolic heart failure - his symptoms have been reasonably well controlled lately.  For questions or updates, please contact [place] Please consult [email or web address] for  contact info under Cardiology/STEMI.      Signed, Cristopher Peru, MD  12/05/2017, 8:32 AM  Patient ID: Victor [name], male   DOB: October 03, 1936, 82 y.o.   MRN: [id number]

## 2017-12-05 NOTE — Progress Notes (Signed)
Patient ID: Victor [name], male   DOB: [birth year], 82 y.o.   MRN: [id number]          [facility]    Date of Admission:  12/03/2017     He remains afebrile.  Interventional radiology is planning aspiration of abdominal wall fluid collection.  There is no indication to start empiric antibiotics at this time.         Michel Bickers, MD [facility] [place] [facility] [phone] pager   [phone] cell 12/05/2017, 1:54 PM

## 2017-12-05 NOTE — Procedures (Signed)
US guided aspiration of the complex collection surrounding the old epicardial wires in the left anterior abdomen.  Very thick brown fluid was aspirated with difficulty.  30 ml of fluid removed but there is still a large amount of residual fluid.  Fluid was sent for culture.  No blood loss and no immediate complication.

## 2017-12-05 NOTE — Progress Notes (Signed)
Patient having intermittent left axillary pain.  Gave tramadol prior to, but patient describes 10 - 10 sharp shooting that pain comes and goes.  Telemetry monitor does not show any changes in rhythm.  Paged doctor.   Doctor returned call-  Continue to monitor and give tramadol as needed.

## 2017-12-05 NOTE — Progress Notes (Signed)
Progress Note  Patient Name: Victor [name] Date of Encounter: 12/05/2017  Primary Cardiologist: Peter Martinique, MD   Subjective   Severe CP whenever he moves or takes a deep breath, denies SOB or CP  Inpatient Medications    Scheduled Meds: . aspirin EC  81 mg Oral QHS  . bisoprolol  2.5 mg Oral Daily  . clopidogrel  75 mg Oral Daily  . docusate sodium  100 mg Oral QHS  . DULoxetine  60 mg Oral Daily  . furosemide  40 mg Oral BID  . insulin aspart  0-15 Units Subcutaneous TID WC  . insulin aspart  0-5 Units Subcutaneous QHS  . insulin detemir  30 Units Subcutaneous q morning - 10a  . losartan  25 mg Oral Daily  . pravastatin  40 mg Oral q1800  . pregabalin  150 mg Oral Daily  . spironolactone  12.5 mg Oral Daily   Continuous Infusions:  PRN Meds: acetaminophen, chlorpheniramine-HYDROcodone, nitroGLYCERIN, ondansetron (ZOFRAN) IV, traMADol   Vital Signs    Vitals:   12/04/17 0532 12/04/17 0856 12/04/17 2039 12/05/17 0546  BP: (!) 126/52 (!) 120/54 139/69 122/61  Pulse: (!) 102 (!) 120 90 88  Resp: _0 Temp: 98.1 F (36.7 C)  98.7 F (37.1 C) 98.5 F (36.9 C)  TempSrc: Oral  Oral Oral  SpO2: 97% 96% 99% 97%  Weight:    157 lb 3 oz (71.3 kg)  Height:        Intake/Output Summary (Last 24 hours) at 12/05/2017 0825 Last data filed at 12/05/2017 0531 Gross per 24 hour  Intake 50 ml  Output 1200 ml  Net -1150 ml   Filed Weights   12/03/17 0326 12/04/17 0217 12/05/17 0546  Weight: 158 lb 15.2 oz (72.1 kg) 158 lb 8.2 oz (71.9 kg) 157 lb 3 oz (71.3 kg)    Telemetry    SR with PVCs and 2 episodes of SVT Personally Reviewed  Physical Exam   GEN: slender, elderly male, uncomfortable w/ movement Neck: no JVD seen Cardiac: Reg R&R Respiratory: Few rales bases. Chest : Chest wall tender, espec Left lower chest and abd GI: + BS, soft, tender on L MS: No edema Neuro: no focal deficits    Labs    Chemistry Recent Labs  Lab 12/03/17 0902  12/03/17 1538 12/04/17 0637  NA 133*  --  131*  K 4.0  --  3.8  CL 93*  --  93*  CO2 27  --  25  GLUCOSE 288*  --  281*  BUN 29*  --  33*  CREATININE 1.21  --  1.51*  CALCIUM 9.1  --  9.0  PROT 6.6 6.1*  --   ALBUMIN 3.6 3.1*  --   AST 19 15  --   ALT 16* 13*  --   ALKPHOS 79 69  --   BILITOT 0.8 0.9  --   GFRNONAA 55*  --  42*  GFRAA >60  --  49*  ANIONGAP 13  --  13     Hematology Recent Labs  Lab 12/03/17 0902 12/04/17 0637  WBC 24.1* 25.2*  RBC 4.54 4.17*  HGB 12.7* 11.2*  HCT 37.7* 35.0*  MCV 83.0 83.9  MCH 28.0 26.9  MCHC 33.7 32.0  RDW 15.1 15.0  PLT 164 129*    Cardiac Enzymes Recent Labs  Lab 12/03/17 0902 12/03/17 1538  TROPONINI 0.06* 0.07*   Lab Results  Component Value Date  ESRSEDRATE 66 (H) 12/04/2017   Urinalysis    Component Value Date/Time   COLORURINE YELLOW 07/17/2017 [place] 07/17/2017 0547   LABSPEC 1.008 07/17/2017 0547   PHURINE 5.0 07/17/2017 [place] 07/17/2017 0547   HGBUR NEGATIVE 07/17/2017 0547   BILIRUBINUR NEGATIVE 07/17/2017 0547   KETONESUR NEGATIVE 07/17/2017 0547   PROTEINUR NEGATIVE 07/17/2017 0547   NITRITE NEGATIVE 07/17/2017 0547   LEUKOCYTESUR SMALL (A) 07/17/2017 0547      Radiology    Dg Chest 2 View  Result Date: 12/03/2017 CLINICAL DATA:  Left upper chest pain.  Previous rib fractures EXAM: CHEST  2 VIEW COMPARISON:  12/02/2017 FINDINGS: CABG. Left AICD remains in place, unchanged. There is cardiomegaly. Low lung volumes. Left base atelectasis. No effusion or pneumothorax. IMPRESSION: Low lung volumes.  Left base atelectasis. Cardiomegaly. Electronically Signed   By: Rolm Baptise M.D.   On: 12/03/2017 07:13   US Abdomen Limited  Result Date: 12/03/2017 CLINICAL DATA:  Left anterior abdominal wall fluid collection seen on CT EXAM: ULTRASOUND ABDOMEN LIMITED COMPARISON:  Chest CT 12/02/2017 FINDINGS: As noted on CT, there is a fluid collection in the anterior  abdominal wall surrounding the pacer wires. The fluid collection measures 9.1 x 5.0 x 2.4 cm. Internal echoes noted. IMPRESSION: Complex fluid collection noted around the pacer wires in the anterior abdominal wall measuring up to 9 cm. Electronically Signed   By: Rolm Baptise M.D.   On: 12/03/2017 07:12    Patient Profile     82 y.o. male with past medical history of coronary artery disease status post coronary artery bypass graft, chronic combined systolic/diastolic congestive heart failure, ischemic cardiomyopathy, history of ventricular tachycardia status post ICD, hypertension, diabetes mellitus, chronic stage III kidney disease, peripheral arterial disease admitted with continuous chest pain and abdominal pain.  Troponin minimally elevated.  Assessment & Plan    1 chest pain- - pleuritic in nature, worse w/ deep inspiration or palpation lower L chest - pt hit in the chest by 40 lb (enthusiastic) dog - Dr Prescott Gum has seen, CT guided drainage of abd abscess/hematoma ordered     2 abdominal pain- - improved, prn rx - see abd [place] report, fluid collection noted - IR is aware, they will do procedure this pm with sedation, keep NPO  3 coronary artery disease-  No evid for active ischemia   - on ASA, Plavix, statin, BB  4 ischemic cardiomyopathy-  Volume status OK   - on ARB and BB - wt is stable - I/O reported neg 2 L but only minimal intake recorded yesterday  5  Rhyhtm   - no VT, 2 short runs SVT on telemetry and PVCs seen last 24 hr. - s/p BSCi ICD - has appt for remote check 01/17, ?do here?  6.  fluid collection surrounding wires of epicardial pacer noted on CT scan- - Dr Curt Bears saw 01/14  - not present on 2017 CT - WBC and ESR elevated but infection this long after implant is unusual - Dr Curt Bears requested ID and TCTS to see>>PVT saw - may need TEE, blood cx done 01/14, no results yet   7. Leukocytosis - slight trend up since admit, recheck in am  Repeat today 25.2  New  from December  - no fevers - ck UA>>ordered - ABX per EP, not yet needed  For questions or updates, please contact [facility] Please consult [email or web address] for contact info under Cardiology/STEMI.  Signed, Rosaria Ferries, PA-C  12/05/2017, 8:25 AM    Pt seen and examined  I agree with findings as noted by R Barrett above  Pt comfortable laying flat  Lungs CTA   Cardiac RRR  No signif murmurs   Chest tender  Ext without edema   Plan for drainage of fluid collection today  Around old pacer wires.   Further Rx based on findings   Dorris Carnes

## 2017-12-05 NOTE — Progress Notes (Signed)
Spoke to son [name]).  Son is concerned over sudden cognitive change in dad.  He reports patient has had some fluctuation cognition but never to this extreme.  Patient is A &O x1 upon reassessment.  Paged Doctor to let him know.  [name] (son) would also like follow up.  Let oncoming night nurse know.

## 2017-12-05 NOTE — Progress Notes (Signed)
IR aware of request for aspiration of abdominal fluid collection around pacer wires.  Consent has been signed and is in the chart.  He is on plavix which should not effect just an aspiration.  INR ordered as no recent INR in system.  Will plan to send aspirate for CX.  Henreitta Cea 10:50 AM 12/05/2017

## 2017-12-06 ENCOUNTER — Inpatient Hospital Stay (HOSPITAL_COMMUNITY): Payer: Medicare Other

## 2017-12-06 DIAGNOSIS — D72829 Elevated white blood cell count, unspecified: Secondary | ICD-10-CM | POA: Diagnosis present

## 2017-12-06 DIAGNOSIS — M62838 Other muscle spasm: Secondary | ICD-10-CM

## 2017-12-06 DIAGNOSIS — R410 Disorientation, unspecified: Secondary | ICD-10-CM

## 2017-12-06 DIAGNOSIS — R0789 Other chest pain: Secondary | ICD-10-CM

## 2017-12-06 DIAGNOSIS — R079 Chest pain, unspecified: Secondary | ICD-10-CM

## 2017-12-06 LAB — LACTIC ACID, PLASMA: LACTIC ACID, VENOUS: 1.4 mmol/L (ref 0.5–1.9)

## 2017-12-06 LAB — BLOOD GAS, ARTERIAL
Acid-Base Excess: 3.9 mmol/L — ABNORMAL HIGH (ref 0.0–2.0)
Bicarbonate: 27.4 mmol/L (ref 20.0–28.0)
Drawn by: 246101
O2 Content: 4 L/min
O2 Saturation: 98.1 %
Patient temperature: 98.6
pCO2 arterial: 37.6 mmHg (ref 32.0–48.0)
pH, Arterial: 7.475 — ABNORMAL HIGH (ref 7.350–7.450)
pO2, Arterial: 101 mmHg (ref 83.0–108.0)

## 2017-12-06 LAB — COMPREHENSIVE METABOLIC PANEL
ALK PHOS: 99 U/L (ref 38–126)
ALT: 33 U/L (ref 17–63)
ANION GAP: 13 (ref 5–15)
AST: 50 U/L — ABNORMAL HIGH (ref 15–41)
Albumin: 2.3 g/dL — ABNORMAL LOW (ref 3.5–5.0)
BUN: 52 mg/dL — ABNORMAL HIGH (ref 6–20)
CALCIUM: 8.3 mg/dL — AB (ref 8.9–10.3)
CO2: 26 mmol/L (ref 22–32)
CREATININE: 2.62 mg/dL — AB (ref 0.61–1.24)
Chloride: 96 mmol/L — ABNORMAL LOW (ref 101–111)
GFR, EST AFRICAN AMERICAN: 25 mL/min — AB (ref >60)
GFR, EST NON AFRICAN AMERICAN: 22 mL/min — AB (ref >60)
Glucose, Bld: 100 mg/dL — ABNORMAL HIGH (ref 65–99)
Potassium: 3.6 mmol/L (ref 3.5–5.1)
SODIUM: 135 mmol/L (ref 135–145)
TOTAL PROTEIN: 5.3 g/dL — AB (ref 6.5–8.1)
Total Bilirubin: 0.6 mg/dL (ref 0.3–1.2)

## 2017-12-06 LAB — CBC WITH DIFFERENTIAL/PLATELET
Basophils Absolute: 0 10*3/uL (ref 0.0–0.1)
Basophils Relative: 0 %
EOS ABS: 0.1 10*3/uL (ref 0.0–0.7)
EOS PCT: 1 %
HCT: 30.5 % — ABNORMAL LOW (ref 39.0–52.0)
Hemoglobin: 10.3 g/dL — ABNORMAL LOW (ref 13.0–17.0)
LYMPHS ABS: 1.2 10*3/uL (ref 0.7–4.0)
LYMPHS PCT: 8 %
MCH: 28.1 pg (ref 26.0–34.0)
MCHC: 33.8 g/dL (ref 30.0–36.0)
MCV: 83.1 fL (ref 78.0–100.0)
MONOS PCT: 7 %
Monocytes Absolute: 1.1 10*3/uL — ABNORMAL HIGH (ref 0.1–1.0)
NEUTROS PCT: 84 %
Neutro Abs: 12.6 10*3/uL — ABNORMAL HIGH (ref 1.7–7.7)
Platelets: 158 10*3/uL (ref 150–400)
RBC: 3.67 MIL/uL — ABNORMAL LOW (ref 4.22–5.81)
RDW: 14.5 % (ref 11.5–15.5)
WBC: 15 10*3/uL — ABNORMAL HIGH (ref 4.0–10.5)

## 2017-12-06 LAB — PROTIME-INR
INR: 1.26
Prothrombin Time: 15.7 seconds — ABNORMAL HIGH (ref 11.4–15.2)

## 2017-12-06 LAB — GLUCOSE, CAPILLARY
GLUCOSE-CAPILLARY: 261 mg/dL — AB (ref 65–99)
GLUCOSE-CAPILLARY: 177 mg/dL — AB (ref 65–99)
GLUCOSE-CAPILLARY: 79 mg/dL (ref 65–99)
Glucose-Capillary: 118 mg/dL — ABNORMAL HIGH (ref 65–99)
Glucose-Capillary: 138 mg/dL — ABNORMAL HIGH (ref 65–99)
Glucose-Capillary: 355 mg/dL — ABNORMAL HIGH (ref 65–99)

## 2017-12-06 LAB — TROPONIN I: Troponin I: 0.13 ng/mL (ref <0.03)

## 2017-12-06 LAB — AMMONIA: AMMONIA: 13 umol/L (ref 9–35)

## 2017-12-06 LAB — PROCALCITONIN: Procalcitonin: 1.33 ng/mL

## 2017-12-06 LAB — CK TOTAL AND CKMB (NOT AT ARMC)
CK TOTAL: 20 U/L — AB (ref 49–397)
CK, MB: 1 ng/mL (ref 0.5–5.0)
RELATIVE INDEX: INVALID (ref 0.0–2.5)

## 2017-12-06 LAB — T4, FREE: Free T4: 1.24 ng/dL — ABNORMAL HIGH (ref 0.61–1.12)

## 2017-12-06 LAB — TSH: TSH: 1.2 u[IU]/mL (ref 0.350–4.500)

## 2017-12-06 MED ORDER — SODIUM CHLORIDE 0.9 % IV BOLUS (SEPSIS): 500.0000 mL | Freq: Once | INTRAVENOUS | Status: DC

## 2017-12-06 MED ORDER — VANCOMYCIN HCL IN DEXTROSE 1-5 GM/200ML-% IV SOLN: 1000.0000 mg | INTRAVENOUS | Status: DC

## 2017-12-06 MED ORDER — PIPERACILLIN-TAZOBACTAM 3.375 G IVPB
3.3750 g | Freq: Three times a day (TID) | INTRAVENOUS | Status: DC
  Administered 2017-12-07: 3.375 g via INTRAVENOUS
  Filled 2017-12-06 (×3): qty 50

## 2017-12-06 MED ORDER — INSULIN GLARGINE 100 UNIT/ML ~~LOC~~ SOLN
15.0000 [IU] | Freq: Every day | SUBCUTANEOUS | Status: DC
  Administered 2017-12-07: 15 [IU] via SUBCUTANEOUS
  Filled 2017-12-06: qty 0.15

## 2017-12-06 MED ORDER — NOREPINEPHRINE BITARTRATE 1 MG/ML IV SOLN
0.0000 ug/min | INTRAVENOUS | Status: DC
  Administered 2017-12-06: 2 ug/min via INTRAVENOUS
  Filled 2017-12-06: qty 4

## 2017-12-06 MED ORDER — SODIUM CHLORIDE 0.9 % IV SOLN
INTRAVENOUS | Status: DC
  Administered 2017-12-07: via INTRAVENOUS

## 2017-12-06 MED ORDER — INSULIN ASPART 100 UNIT/ML ~~LOC~~ SOLN
0.0000 [IU] | SUBCUTANEOUS | Status: DC
  Administered 2017-12-07: 1 [IU] via SUBCUTANEOUS

## 2017-12-06 MED ORDER — VANCOMYCIN HCL IN DEXTROSE 1-5 GM/200ML-% IV SOLN
1000.0000 mg | Freq: Once | INTRAVENOUS | Status: AC
  Administered 2017-12-06: 1000 mg via INTRAVENOUS
  Filled 2017-12-06: qty 200

## 2017-12-06 MED ORDER — SODIUM CHLORIDE 0.9 % IV BOLUS (SEPSIS): 500.0000 mL | Freq: Once | INTRAVENOUS | Status: DC | PRN

## 2017-12-06 MED ORDER — TIZANIDINE HCL 4 MG PO TABS
2.0000 mg | ORAL_TABLET | Freq: Three times a day (TID) | ORAL | Status: DC | PRN
  Administered 2017-12-06: 2 mg via ORAL
  Filled 2017-12-06: qty 1

## 2017-12-06 MED ORDER — GLUCAGON HCL RDNA (DIAGNOSTIC) 1 MG IJ SOLR
5.0000 mg | Freq: Once | INTRAVENOUS | Status: DC
  Filled 2017-12-06: qty 5

## 2017-12-06 MED ORDER — NALOXONE HCL 0.4 MG/ML IJ SOLN
INTRAMUSCULAR | Status: AC
  Administered 2017-12-06: 17:00:00
  Filled 2017-12-06: qty 1

## 2017-12-06 MED ORDER — NALOXONE HCL 0.4 MG/ML IJ SOLN
0.4000 mg | Freq: Once | INTRAMUSCULAR | Status: AC
  Administered 2017-12-06: 0.4 mg via INTRAVENOUS

## 2017-12-06 MED ORDER — NALOXONE HCL 0.4 MG/ML IJ SOLN
INTRAMUSCULAR | Status: AC
  Filled 2017-12-06: qty 1

## 2017-12-06 MED ORDER — PIPERACILLIN-TAZOBACTAM 3.375 G IVPB 30 MIN
3.3750 g | Freq: Once | INTRAVENOUS | Status: AC
  Administered 2017-12-06: 3.375 g via INTRAVENOUS
  Filled 2017-12-06: qty 50

## 2017-12-06 MED ORDER — SODIUM CHLORIDE 0.9 % IV BOLUS (SEPSIS)
1000.0000 mL | Freq: Once | INTRAVENOUS | Status: AC
  Administered 2017-12-06: 1000 mL via INTRAVENOUS

## 2017-12-06 MED ORDER — NALOXONE HCL 0.4 MG/ML IJ SOLN: 0.4000 mg | Freq: Once | INTRAMUSCULAR | Status: DC

## 2017-12-06 MED ORDER — FUROSEMIDE 40 MG PO TABS: 40.0000 mg | ORAL_TABLET | Freq: Every day | ORAL | Status: DC

## 2017-12-06 NOTE — Progress Notes (Signed)
Progress Note  Patient Name: Victor [name] Date of Encounter: 12/06/2017  Primary Cardiologist: Peter Martinique, MD   Subjective   Intermittent spasms of Lt sided chest pain  Inpatient Medications    Scheduled Meds: . aspirin EC  81 mg Oral QHS  . bisoprolol  2.5 mg Oral Daily  . clopidogrel  75 mg Oral Daily  . docusate sodium  100 mg Oral QHS  . DULoxetine  60 mg Oral Daily  . furosemide  40 mg Oral BID  . insulin aspart  0-15 Units Subcutaneous TID WC  . insulin aspart  0-5 Units Subcutaneous QHS  . insulin detemir  30 Units Subcutaneous q morning - 10a  . losartan  25 mg Oral Daily  . pravastatin  40 mg Oral q1800  . pregabalin  150 mg Oral Daily  . spironolactone  12.5 mg Oral Daily   Continuous Infusions:  PRN Meds: acetaminophen, chlorpheniramine-HYDROcodone, nitroGLYCERIN, ondansetron (ZOFRAN) IV, traMADol   Vital Signs    Vitals:   12/05/17 2038 12/06/17 0357 12/06/17 0500 12/06/17 0900  BP: (!) 124/47  (!) 113/54 (!) 113/59  Pulse: 93  95 94  Resp: 20  20   Temp: 97.6 F (36.4 C)  (!) 97.5 F (36.4 C)   TempSrc: Axillary  Axillary   SpO2: 100%  96% 94%  Weight:  152 lb 8 oz (69.2 kg)    Height:        Intake/Output Summary (Last 24 hours) at 12/06/2017 1020 Last data filed at 12/06/2017 0500 Gross per 24 hour  Intake 240 ml  Output 920 ml  Net -680 ml   Filed Weights   12/04/17 0217 12/05/17 0546 12/06/17 0357  Weight: 158 lb 8.2 oz (71.9 kg) 157 lb 3 oz (71.3 kg) 152 lb 8 oz (69.2 kg)    Telemetry    Will review with MD- NSR with 1st degree AVB  Physical Exam   GEN: slender, pale, elderly male, comfortable between spasms of Lt sided localized chest pain Neck: no JVD seen Cardiac: Reg R&R Respiratory: Few rales bases. Chest : Chest wall tender, espec Left lower chest and abd GI: + BS, soft, tender on L MS: No edema Neuro: no focal deficits    Labs    Chemistry Recent Labs  Lab 12/03/17 0902 12/03/17 1538 12/04/17 0637  NA  133*  --  131*  K 4.0  --  3.8  CL 93*  --  93*  CO2 27  --  25  GLUCOSE 288*  --  281*  BUN 29*  --  33*  CREATININE 1.21  --  1.51*  CALCIUM 9.1  --  9.0  PROT 6.6 6.1*  --   ALBUMIN 3.6 3.1*  --   AST 19 15  --   ALT 16* 13*  --   ALKPHOS 79 69  --   BILITOT 0.8 0.9  --   GFRNONAA 55*  --  42*  GFRAA >60  --  49*  ANIONGAP 13  --  13     Hematology Recent Labs  Lab 12/03/17 0902 12/04/17 0637  WBC 24.1* 25.2*  RBC 4.54 4.17*  HGB 12.7* 11.2*  HCT 37.7* 35.0*  MCV 83.0 83.9  MCH 28.0 26.9  MCHC 33.7 32.0  RDW 15.1 15.0  PLT 164 129*    Cardiac Enzymes Recent Labs  Lab 12/03/17 0902 12/03/17 1538  TROPONINI 0.06* 0.07*   Lab Results  Component Value Date   ESRSEDRATE 66 (H) 12/04/2017  Urinalysis    Component Value Date/Time   COLORURINE YELLOW 12/05/2017 1200   APPEARANCEUR HAZY (A) 12/05/2017 1200   LABSPEC 1.012 12/05/2017 1200   PHURINE 5.0 12/05/2017 1200   GLUCOSEU 150 (A) 12/05/2017 1200   HGBUR NEGATIVE 12/05/2017 1200   BILIRUBINUR NEGATIVE 12/05/2017 1200   KETONESUR NEGATIVE 12/05/2017 1200   PROTEINUR 30 (A) 12/05/2017 1200   NITRITE NEGATIVE 12/05/2017 1200   LEUKOCYTESUR NEGATIVE 12/05/2017 1200      Radiology    Dg Chest 2 View  Result Date: 12/03/2017 CLINICAL DATA:  Left upper chest pain.  Previous rib fractures EXAM: CHEST  2 VIEW COMPARISON:  12/02/2017 FINDINGS: CABG. Left AICD remains in place, unchanged. There is cardiomegaly. Low lung volumes. Left base atelectasis. No effusion or pneumothorax. IMPRESSION: Low lung volumes.  Left base atelectasis. Cardiomegaly. Electronically Signed   By: Rolm Baptise M.D.   On: 12/03/2017 07:13   US Abdomen Limited  Result Date: 12/03/2017 CLINICAL DATA:  Left anterior abdominal wall fluid collection seen on CT EXAM: ULTRASOUND ABDOMEN LIMITED COMPARISON:  Chest CT 12/02/2017 FINDINGS: As noted on CT, there is a fluid collection in the anterior abdominal wall surrounding the pacer  wires. The fluid collection measures 9.1 x 5.0 x 2.4 cm. Internal echoes noted. IMPRESSION: Complex fluid collection noted around the pacer wires in the anterior abdominal wall measuring up to 9 cm. Electronically Signed   By: Rolm Baptise M.D.   On: 12/03/2017 07:12   US Aspiration  Result Date: 12/05/2017 INDICATION: 82 year old with a fluid collection surrounding epicardial pacer wires in left anterior abdominal wall. EXAM: ULTRASOUND-GUIDED ASPIRATION OF LEFT ABDOMINAL WALL FLUID COLLECTION MEDICATIONS: None ANESTHESIA/SEDATION: None COMPLICATIONS: None immediate. PROCEDURE: Informed written consent was obtained from the patient after a thorough discussion of the procedural risks, benefits and alternatives. All questions were addressed. A timeout was performed prior to the initiation of the procedure. The fluid collection in the left anterior abdominal wall was identified with ultrasound. This area was prepped with chlorhexidine and sterile field was created. Skin was anesthetized with 1% lidocaine. Using ultrasound guidance, an 18 gauge spinal needle was directed into the collection. Very thick brown fluid was aspirated. Very little fluid could be aspirated from this needle. Therefore, a 5 [place] Yueh catheter was directed into the collection with ultrasound guidance. Again, very thick brown fluid was removed. Approximately 30 mL of fluid was able to be removed. The entire collection could not be decompressed. Yueh catheter was removed. Bandage placed over the puncture site. FINDINGS: Complex fluid collection surrounding epicardial pacer wires in left anterior abdominal wall. Yueh catheter was successfully advanced into the collection and very thick brown fluid was aspirated. 30 mL of fluid was removed. There was still a large amount of fluid remaining in this collection at the end of the procedure. IMPRESSION: Ultrasound-guided aspiration of the complex fluid collection in the left anterior abdominal wall  surrounding epicardial pacer wires. Very thick brown fluid was removed from this collection and sent for culture. Based on the fluid consistency, the patient will likely need surgical intervention to remove all the fluid. Electronically Signed   By: Markus Daft M.D.   On: 12/05/2017 20:24    Patient Profile     82 y.o. male with past medical history of CAD, s/p CABG, chronic combined systolic/diastolic congestive heart failure, ischemic cardiomyopathy-last EF 35-40%, history of VT status post ICD, hypertension, diabetes mellitus, chronic stage III kidney disease, and PVD admitted with continuous chest pain and abdominal  pain. CT scan at an outside hospital showed fluid collection around ICD wires. Troponin minimally elevated.  Assessment & Plan    1 chest pain- -  lower L chest- ? Muscle spasm - pt hit in the chest by 40 lb (enthusiastic) dog a week ago- possible     [facility] - Dr Prescott Gum and ID have seen, CT guided aspiration done 12/05/16   2 coronary artery disease-  No evid for active ischemia   - on ASA, Plavix, statin, BB  3 ischemic cardiomyopathy-  Volume status OK   - on ARB and BB - wt is stable - I/O reported neg 2.7 L  4  Rhythm   - NSR-1st degree AVB - will review with MD  5  fluid collection surrounding wires of epicardial pacer noted on CT scan-awaiting aspiration results but it appears this may be old blood  6 Leukocytosis - slight trend today  WBC 25K   - no fevers - ck UA>>ordered - ABX per ID, not yet needed  7 Acute kidney injury - SCr up to 1.5 from 1.2  Hold Lasix and Cozaar  Plan: I spoke with family. They are appropriately concerned as pt is having frequent spasms of pain. Pain medication not helping and has made him confused at times. I will try a muscle relaxer.   Hold Cozaar and decrease Lasix to 40 mg daily secondary to poor PO intake and bump in SCr. Follow BMP.    For questions or updates, please contact [place] Please consult [email or web address]  for contact info under Cardiology/STEMI.      Signed, Kerin Ransom, PA-C  12/06/2017, 10:20 AM    Patient seen and examined  I agree with findings as noted above  When I came to see pt he was sleeping  Family said he was confused.   Waking up he was a bit confused  Chst sore   Otherwise exam: Lungs CTA  Cardiac RRR  No signif murmurs  Chest L side tender  Ext without edema Neuro  Confused, sleepy  Otherwise nonfocal Was OK earlier   Plan: Follow confusion  Hold all pain meds/ muscle relaxants     Await results from fluid collections. ID following  Dorris Carnes

## 2017-12-06 NOTE — Progress Notes (Addendum)
Shift event: At shift change, RN paged NP that pt's BP was in the 70s with a MAP in the 50s. Also, pt very lethargic and difficult to arouse. Bolus ordered and NP reviewed chart and went to the bedside. Brief HPI: Pt admitted on 1/13 by cardiology for CP and + trop from [facility]. CT chest without dissection nor PE. No exacerbation of CHF. CT abd pelvis showed cholelithiasis and aortic sclerosis. Found to have accumulation of fluid/seroma at pacer wires. Fluid was aspirated and had no growth. ID said to follow without abx. Pt has extensive coronary artery disease with CABG, stents, and MI in past, most recent NSTEMI in Dec 2018.  Today, pt spiked fever of 160f, had increased lethargy, and hypotension, and [facility] were consulted for encephalopathy. Per our doc's notes, pt received a lot of sedative type meds today along with anti hypertensives and Lasix. Pt received narcan without reversal of condition. Dr. Posey Pronto ordered blood work and boluses which were pending at shift change. NP briefly discussed pt's condition with Dr. Posey Pronto before coming to the bedside. S: Upon arrival, pt can not participate in ROS due to mental status.  O: Pale, poor, chronically ill appearing elderly WM in NAD. Eyes closed. Only reaction is to tactile sternal rub and pain. Will not open eyes or follow any commands. Non verbal. MOE x 4 when stimulated. PERRL. Strength is good when he moves around. ST elevation on tele which is chronic per Dr. Ellyn Hack, cards, in room. Respiratory effort is normal. Not in distress. O2 sat normal.  Skin is pale, warm, very dry. Mucus membranes are dry. Skin tenting noted.  No edema noted to LEs.  A/P: 1. Hypotension-ongoing even with/after boluses. Will start Levophed, discussed with cardiology. Called PCCM for line placement and suggested we get IV team to start peripheral and/or midline for Levophed, since we may not need it for long and they were doing other consults.  After  Levophed, BP up to 1 teens, MAP 70s on 7.5 mics of Levophed. Foley placed for urine output monitoring. Had 500cc on bladder scan. Maintenance IVF at 100cc after boluses finished. Hold Lasix.  2. Lethargy/encephalopathy-multifactorial-polypharmacy and hypotension. ABG without hypercarbia. Ammonia 13. TSH normal. Na and K normal.  Doubt stroke, so CT head placed on hold for now in light of BP issues. He is moving everything and talking and awake after BP up, so doubt stroke. He is already on Plavix and ASA anyway. If no further encephalopathic issues by am, will cancel CT head. Hold all sedative meds.  3. AKI-worsened-has had boluses-2L. Lasix and Cozaar on hold. Maintenance IVF increased and r/p BMP in am.  4. ? Sepsis-given hypotension and lethargy and fever spike, this is possibility. No tachycardia or hypoxia. No obvious source of infection. Sent urine cx when foley placed. WBCC is trending down. Fever down. Trend curve. LA nomal, continue to cycle. Procalc is 1.33. On empiric abx, Vanc and Zosyn. Blood cxs sent. 5. Elevated troponin-no chest pain now. ST elevation on EKG "normal" per cardiology. Cards is following and are actually attendings. Cont cycle CEs.   As of 2130, pt's BP better and he is awake and talking, joking with the nurses.   Total critical care time: 170 minutes Critical care time was exclusive of separately billable procedures and treating other patients. Critical care was necessary to treat or prevent imminent or life-threatening deterioration. Critical care was time spent personally by me on the following activities: development of treatment plan with patient  and/or surrogate as well as nursing, discussions with consultants, evaluation of patient's response to treatment, examination of patient, obtaining history from patient or surrogate, ordering and performing treatments and interventions, ordering and review of laboratory studies, ordering and review of radiographic studies, pulse  oximetry and re-evaluation of patient's condition.  Had 30 min conversation with family members about pt's condition and tx plan. They are in aggreement.  Later, attempted to call grandson with improvement in pt's condition, no answer.  KJKG, NP [facility] Update: 4am. BP 1 teens. HR stable. Levophed down to 2 mics. LA still neg and trop down to .10 from .13. Baltazar Najjar, NP

## 2017-12-06 NOTE — Progress Notes (Signed)
  Subjective: Aspirate of abdominal fluid collection shows no white cells no growth. Brown fluid removed consistent with liquefied rectus hematoma. Would not add any other anticoagulation to his Plavix Objective: Vital signs in last 24 hours: Temp:  [97.5 F (36.4 C)-97.6 F (36.4 C)] 97.5 F (36.4 C) (01/16 0500) Pulse Rate:  [78-95] 88 (01/16 1628) Cardiac Rhythm: Atrial fibrillation;Bundle branch block (01/16 0700) Resp:  [20] 20 (01/16 0500) BP: (81-140)/(44-108) 90/50 (01/16 1700) SpO2:  [94 %-100 %] 96 % (01/16 1628) Weight:  [152 lb 8 oz (69.2 kg)] 152 lb 8 oz (69.2 kg) (01/16 0357)  Hemodynamic parameters for last 24 hours:  stable  Intake/Output from previous day: 01/15 0701 - 01/16 0700 In: 240 [P.O.:240] Out: 920 [Urine:920] Intake/Output this shift: No intake/output data recorded.    Lab Results: Recent Labs    12/04/17 0637  WBC 25.2*  HGB 11.2*  HCT 35.0*  PLT 129*   BMET:  Recent Labs    12/04/17 0637  NA 131*  K 3.8  CL 93*  CO2 25  GLUCOSE 281*  BUN 33*  CREATININE 1.51*  CALCIUM 9.0    PT/INR:  Recent Labs    12/05/17 1043  LABPROT 15.2  INR 1.21   ABG    Component Value Date/Time   PHART 7.475 (H) 12/06/2017 1730   HCO3 27.4 12/06/2017 1730   TCO2 32 03/20/2015 1638   O2SAT 98.1 12/06/2017 1730   CBG (last 3)  Recent Labs    12/06/17 0726 12/06/17 1123 12/06/17 1626  GLUCAP 261* 177* 138*    Assessment/Plan: S/P  Doubt infected epicardial pacing leads from old abdominal AICD implant site. Suspect rectus hematoma from trauma.   LOS: 3 days    Victor [name] 12/06/2017

## 2017-12-06 NOTE — Plan of Care (Signed)
  Progressing Skin Integrity: Risk for impaired skin integrity will decrease 12/06/2017 2221 - Progressing by Netta Corrigan, RN Note Sacral foam placed on patient to prevent skin breakdown/pressure ulcers.

## 2017-12-06 NOTE — Progress Notes (Signed)
Pt was alert and verbal during the morning and early afternoon, complained of pain in am gave him PRN tramadol, and muscle relaxant as order by MD, rechecked after an hour and pt was asleep but arrousable to touch and voice. Late this evening tech went in to take vitals and notice pt was un arrousable to touch and B/P was low, reported to nurse, nurse went in and did mannual B/P and was 90/ 60, tried arrousing pt with touch and voice but was very somolent, rapid was called to assess pt and PA was present on unit and ordered to gave  pt a dose of narcan, and  transfer to a step down unit for a more aggressive medical care.  B/P still low in rapid presence, bolus fluid was given per MD order and another dose of narcan was given. Family was present at bedside when this occurred, were inform  About all the treatments and plans that was taking place

## 2017-12-06 NOTE — Significant Event (Addendum)
Rapid Response Event Note  Overview:  Called to assist with patient with decreased LOC Time Called: [id number] Arrival Time: 1962 Event Type: Hypotension, Neurologic  Initial Focused Assessment:  Patient supine in bed - warm and dry - pale - responds to sternal rub with moaning - moves all 4 extremities to DPS - oral mucosa very dry - bil BS = clear - resps shallow - regular not labored - abd soft - left lower abd area with bandaid intact from tap yesterday - small area of slight bruising noted - no hematoma felt - soft - patient does not seem painful there.  HS clear - irregular - in afib on monitor - normal for patient.  Condom cath patent - small amount urine noted.  No JVD - no pedal edema.  Staff reports patient had one Ultram this am for left shoulder/lateral chest pain - still with lot of pain around 11am got oral dose of Zanaflex - family in room report patient has been sleeping all day since medicated - prior to that was sitting up eating breakfast.   HR 78 RR 16 - shallow - O2 sats 100% on 4 liters nasal cannula - patient wears 2 liters nasal cannula at home.     Interventions:  RN administering Narcan on my arrival per order Kerin Ransom - total of .04 mg IV Narcan given.  Patient with some response - will open eyes to sternal rub - grips bilateral to command - mumbled speech - then falls back to sleep.  BP soft - 74/53 - checked with manual - correlates.  Stat ABG done.   12 lead EKG - Dr. Ellyn Hack reviewing.  NS bolus started - lungs clear.  Dr. Ellyn Hack to bedside - consulted with hospitalist Dr. Posey Pronto - to bedside.  BP remains soft - second dose of Narcan 0.4 mg IV given.  500 cc NS infused - BP remains low - 65/38 to 70/42 - again confirmed with manual cuff.  PCXR done - patient now presenting with diaphoresis and hot to touch - rectal temp 102 - Dr. Posey Pronto and Dr. Ellyn Hack notified.  Labs drawn.  Concerned with persistent hypotension - MD's notified.  Will transfer to ICU - NS continued to run -  second IV started right forearm #20 angio to NSL.  Family at bedside - continued to be updated by myself and MD staff.  Transferred to 2H04 - report given to Altha Harm and Lattie Haw RN - questions answered. Needs CT scan - will stabilize BP first - question need for CT abdomen also.  Patient with some periods of agitation - asking "what are we doing" then becomes somulent again.  BP remains 75/40 range with MAP in the 50's.    Plan of Care (if not transferred):  Event Summary: Name of Physician Notified: Dr. Ellyn Hack - Kerin Ransom PA at (PTA RRT )  Name of Consulting Physician Notified: Dr. Posey Pronto at (by Dr. Ellyn Hack)  Outcome: Transferred (Comment)  Event End Time: 2297(9G92)  Quin Hoop

## 2017-12-06 NOTE — Progress Notes (Signed)
Pt is having severe pain rate it at a 9 on a 0- 10 pain scale to his left axiliary, gave pain med as order, daughter is at bedside with pt.

## 2017-12-06 NOTE — Progress Notes (Signed)
[facility]   Reason for visit: Follow up on abdominal fluid collection  Interval History: aspirated yesterday and no WBCs noted, no organisms, no growth to date; no WBC today, 1/13 was 25.    Remains off of antibiotics Chest pain spasm wittnessed   Physical Exam: Constitutional:  Vitals:   12/06/17 0500 12/06/17 0900  BP: (!) 113/54 (!) 113/59  Pulse: 95 94  Resp: 20   Temp: (!) 97.5 F (36.4 C)   SpO2: 96% 94%   patient appears in NAD Eyes: anicteric HENT: no thrush Respiratory: Normal respiratory effort; CTA B Cardiovascular: RRR GI: soft, nt, nd  Review of Systems: Constitutional: negative for fevers and chills Gastrointestinal: negative for diarrhea MS: having spasm chest pain on left side that lasts about 5-10 seconds and severe in pain level  Lab Results  Component Value Date   WBC 25.2 (H) 12/04/2017   HGB 11.2 (L) 12/04/2017   HCT 35.0 (L) 12/04/2017   MCV 83.9 12/04/2017   PLT 129 (L) 12/04/2017    Lab Results  Component Value Date   CREATININE 1.51 (H) 12/04/2017   BUN 33 (H) 12/04/2017   NA 131 (L) 12/04/2017   K 3.8 12/04/2017   CL 93 (L) 12/04/2017   CO2 25 12/04/2017    Lab Results  Component Value Date   ALT 13 (L) 12/03/2017   AST 15 12/03/2017   ALKPHOS 69 12/03/2017     Microbiology: Recent Results (from the past 240 hour(s))  Culture, blood (Routine X 2) w Reflex to ID Panel     Status: None (Preliminary result)   Collection Time: 12/04/17 12:40 PM  Result Value Ref Range Status   Specimen Description BLOOD RIGHT ANTECUBITAL  Final   Special Requests IN PEDIATRIC BOTTLE Blood Culture adequate volume  Final   Culture NO GROWTH < 24 HOURS  Final   Report Status PENDING  Incomplete  Culture, blood (Routine X 2) w Reflex to ID Panel     Status: None (Preliminary result)   Collection Time: 12/04/17 12:44 PM  Result Value Ref Range Status   Specimen Description BLOOD RIGHT HAND  Final   Special Requests  IN PEDIATRIC BOTTLE Blood Culture adequate volume  Final   Culture NO GROWTH < 24 HOURS  Final   Report Status PENDING  Incomplete  Body fluid culture     Status: None (Preliminary result)   Collection Time: 12/05/17  3:35 PM  Result Value Ref Range Status   Specimen Description FLUID ABDOMEN  Final   Special Requests Normal  Final   Gram Stain NO WBC SEEN NO ORGANISMS SEEN   Final   Culture PENDING  Incomplete   Report Status PENDING  Incomplete    Impression/Plan:  1. Fluid collection - no signs of infection on aspiration with no WBCs and no organism. Will continue to watch the culture.   I most suspect hematoma  2.  Chest pain/spasm - is episodic and witnessed by me. I would not think cardiac in origin.  I most suspect MS with muscle spasm.  I am not sure of etiology.  Not relieved by Tramadol. ? A dose of Toradol would be beneficial as an antiinflammatory ?possibly a muscle relaxant  3.  Intermittent confusion - he is getting more Tramadol than typically gets at home and may be the cause of his confusion.  Consider decreasing the frequency.  4.  Leukocytosis - does not appear to be infectious in origin with  no signs or symptoms of infection.  I will repeat in the am.

## 2017-12-06 NOTE — Consult Note (Addendum)
[facility] Initial Consultation Note   Patient Name: Victor [name]    QTM:[id number] PCP: Cyndi Bender, PA-C     DOB: 06-Dec-1935  DOA: 12/03/2017 DOS: the patient was seen and examined on 12/06/2017   Referring physician: Dr Ellyn Hack Reason for consult: acute encephalopathy  HPI: Victor [name] is a 82 y.o. male with Past medical history of CAD S/P CABG, ischemic cardiomyopathy with chronic combined CHF, chronic kidney disease stage III, arthritis, HTN, chronic respiratory failure on 2 L of oxygen, CVA, PVD, type II DM on chronic insulin. Patient is coming from [facility]. Patient presented with complaints of chest pain prior to be musculoskeletal without any benefit from nitroglycerin.  CT PE in the other facility was negative for dissection or PE.  Patient was transferred to [facility] for further workup for his chest pain as well as the finding of fluid surrounding epicardial pacer. During the stay in the hospital patient continues to have chest pain as well as diffuse abdominal pain.  Interventional radiology as well as cardio thoracic surgery was consulted for fluid collection. Patient underwent ultrasound-guided fluid aspiration on 12/05/2017 30 mL fluid brown thick was removed.  Still large amount of fluid remains in the pocket which will require surgical removal. Patient's Cymbalta was increased from 30 mg daily to 60 mg daily.  50 mg 3 times daily as needed, last dose was on 9.11 on 12/06/2017.  Zanaflex was also started last dose was on 11.22 on 12/06/2017. On 12/06/2017 after receiving Zanaflex and tramadol the patient become more lethargic and sleepy and drowsy.  Later during the day the patient was not able to stay awake.  Rapid response was called for further assistance.  Hospitalist was called for further management of acute encephalopathy. At the time of my evaluation rapid response nurse was at bedside, patient was drowsy and lethargic but arousable on verbal  command.  Nonverbal following commands and falling asleep during the conversation.  Blood glucose was 138, blood pressure was 76/42 without any tachycardia. Patient received beta-blocker this morning.  Review of Systems: as mentioned in the history of present illness.  All other systems reviewed and are negative.  Past Medical History:  Diagnosis Date  . Anemia   . Arthritis   . Cardiomyopathy, ischemic    a. s/p ICD placement  . Chronic combined systolic and diastolic CHF (congestive heart failure) ([place])   . CKD (chronic kidney disease), stage III ([place])   . COPD (chronic obstructive pulmonary disease) ([place])   . Coronary artery disease    a. s/p CABG 1990. b. multiple PCIs since that time including CTO angioplasty (without stenting) PCI of RCA 09/2014, with last cath 02/2015 done for worsening CHF with unsuccessful attempt at PCI of the RCA due to inability to cross occlusion - medical therapy advised at that time.  Marland Kitchen GERD (gastroesophageal reflux disease)   . HLD (hyperlipidemia)   . Hypertension   . Occlusion and stenosis of carotid artery without mention of cerebral infarction   . On home oxygen therapy    "2L; 24/7" (11/08/2017)  . Orthostatic hypotension   . Paroxysmal ventricular tachycardia ([place])   . Peripheral vascular disease ([place])   . Raynaud's syndrome   . Stroke [facility])    a. CT head 06/2015: Small old lacunar infarct LEFT basal ganglia.  . Thrombocytopenia ([place])   . TIA (transient ischemic attack)    a. s/p LHC on 10/01/14   . Type II diabetes mellitus ([place])  Past Surgical History:  Procedure Laterality Date  . ANAL FISTULECTOMY  2000's  . CARDIAC CATHETERIZATION  10/01/2014   PTVA to CTO with restoration of TIMI 3  . CATARACT EXTRACTION, BILATERAL Bilateral ~ 2009  . CORONARY ARTERY BYPASS GRAFT  01/1989   "CABG X3"   . FINGER FRACTURE SURGERY Left ~ 2000   "pointer"  . IMPLANTABLE CARDIOVERTER DEFIBRILLATOR (ICD) GENERATOR CHANGE Left 10/05/2011   Procedure:  ICD GENERATOR CHANGE;  Surgeon: Evans Lance, MD;  Location: [facility] CATH LAB;  Service: Cardiovascular;  Laterality: Left;  . LEFT AND RIGHT HEART CATHETERIZATION WITH CORONARY/GRAFT ANGIOGRAM N/A 04/26/2013   Procedure: LEFT AND RIGHT HEART CATHETERIZATION WITH Beatrix Fetters;  Surgeon: Sherren Mocha, MD;  Location: [facility] CATH LAB;  Service: Cardiovascular;  Laterality: N/A;  . LEFT HEART CATHETERIZATION WITH CORONARY ANGIOGRAM N/A 08/01/2014   Procedure: LEFT HEART CATHETERIZATION WITH CORONARY ANGIOGRAM;  Surgeon: Sinclair Grooms, MD;  Location: [facility] CATH LAB;  Service: Cardiovascular;  Laterality: N/A;  . LEFT HEART CATHETERIZATION WITH CORONARY ANGIOGRAM N/A 03/20/2015   Procedure: LEFT HEART CATHETERIZATION WITH CORONARY ANGIOGRAM;  Surgeon: Peter M Martinique, MD;  Location: [facility] CATH LAB;  Service: Cardiovascular;  Laterality: N/A;  . PERCUTANEOUS STENT INTERVENTION  04/26/2013   Procedure: PERCUTANEOUS STENT INTERVENTION;  Surgeon: Sherren Mocha, MD;  Location: [facility] CATH LAB;  Service: Cardiovascular;;  . RIGHT HEART CATHETERIZATION  10/01/2014   Procedure: RIGHT HEART CATH;  Surgeon: Peter M Martinique, MD;  Location: [facility] CATH LAB;  Service: Cardiovascular;;   Social History:  reports that he has quit smoking. His smoking use included pipe. He quit after 3.00 years of use. he has never used smokeless tobacco. He reports that he drinks alcohol. He reports that he does not use drugs.  Allergies  Allergen Reactions  . Coreg [Carvedilol] Other (See Comments)    Shortness of breath ,fatigue ,dizzyness   . Lovastatin Other (See Comments)    CK elevation, Myalgias  . Ativan [Lorazepam] Other (See Comments)    Pt. had side effects    Family History  Problem Relation Age of Onset  . Heart failure Mother 44  . Heart attack Father   . Leukemia Sister   . Stomach cancer Maternal Grandfather     Prior to Admission medications   Medication Sig Start Date End Date Taking? Authorizing Provider    aspirin (ASPIR-81) 81 MG EC tablet Take 81 mg by mouth at bedtime.    Yes [provider]  bisoprolol (ZEBETA) 5 MG tablet Take 0.5 tablets (2.5 mg total) by mouth daily. 01/27/17  Yes Martinique, Peter M, MD  CALCIUM PO Take 1 tablet by mouth at bedtime.   Yes [provider]  clopidogrel (PLAVIX) 75 MG tablet Take 1 tablet (75 mg total) by mouth daily. 01/27/17  Yes Martinique, Peter M, MD  Cyanocobalamin (VITAMIN B-12 PO) Take 1 tablet by mouth daily.    Yes [provider]  dexamethasone (DECADRON) 4 MG tablet Take 4 mg by mouth See admin instructions. Take on Thursdays, Saturdays, and Mondays 11/28/17  Yes [provider]  DULoxetine (CYMBALTA) 30 MG capsule Take 30 mg by mouth daily.  10/13/17  Yes [provider]  furosemide (LASIX) 40 MG tablet Take 1 tablet (40 mg total) by mouth 2 (two) times daily. 11/09/17 12/09/17 Yes Regalado, Belkys A, MD  HUMALOG KWIKPEN 100 UNIT/ML KiwkPen Inject into the skin 3 (three) times daily. Per sliding scale 10/13/17  Yes [provider]  [place]  100 UNIT/ML Pen INJECT 30 UNITS UNDER SKIN EACH MORNING 04/15/16  Yes [provider]  losartan (COZAAR) 50 MG tablet Take 0.5 tablets (25 mg total) by mouth daily. 11/09/17 10/30/19 Yes Regalado, Belkys A, MD  LYRICA 150 MG capsule Take 150 mg by mouth daily.  02/03/15  Yes [provider]  nitroGLYCERIN (NITROSTAT) 0.4 MG SL tablet Place 0.4 mg under the tongue every 5 (five) minutes as needed for chest pain.   Yes [provider]  omeprazole (PRILOSEC) 20 MG capsule Take 20 mg by mouth every morning. 11/29/17  Yes [provider]  pravastatin (PRAVACHOL) 40 MG tablet Take 1 tablet (40 mg total) by mouth daily at 6 PM. 11/09/17  Yes Regalado, Belkys A, MD  PRESCRIPTION MEDICATION Apply 1 application topically as needed (Neck Pain).   Yes [provider]  spironolactone (ALDACTONE) 25 MG tablet Take 0.5 tablets (12.5 mg  total) by mouth daily. Patient taking differently: Take 25 mg by mouth daily.  11/10/17  Yes Regalado, Belkys A, MD  traMADol (ULTRAM) 50 MG tablet Take 50 mg by mouth 3 (three) times daily as needed for pain. 10/16/17  Yes [provider]  docusate sodium (COLACE) 100 MG capsule Take 1 capsule (100 mg total) by mouth at bedtime. Patient not taking: Reported on 12/03/2017 11/09/17   Regalado, Jerald Kief A, MD  mupirocin ointment (BACTROBAN) 2 % Apply 1 application topically 2 (two) times daily. 06/24/15   [provider]  traZODone (DESYREL) 50 MG tablet Take 1 tablet by mouth at bedtime. 08/10/16   [provider]    Physical Exam: Vitals:   12/06/17 1124 12/06/17 1252 12/06/17 1628 12/06/17 1700  BP: (!) 86/44 (!) 140/108 (!) 81/53 (!) 90/50  Pulse: 93 78 88   Resp:      Temp:      TempSrc:      SpO2: 98% 97% 96%   Weight:      Height:        General: Drowsy and lethargic, nonverbal but following commands.  Appear in moderate distress, affect appropriate Eyes: PERRL, Conjunctiva normal ENT: Oral Mucosa clear moist. Neck: difficult to assess JVD, no Abnormal Mass Or lumps Cardiovascular: S1 and S2 Present, no Murmur, Peripheral Pulses Present Respiratory: Bilateral Air entry equal and Decreased, no use of accessory muscle, faint basal Crackles, no wheezes Abdomen: Bowel Sound present, Soft and no tenderness Skin: redness no, no Rash, no induration Extremities: no Pedal edema, no calf tenderness Neurologic: Mental status drowsy and lethargic, nonverbal Cranial Nerves PERRL, EOM normal and present, Motor strength spontaneously moving all extremities Sensation present to painful stimuli Reflexes difficult to elicit Gait not checked due to patient safety concerns.    Labs:  CBC: Recent Labs  Lab 12/03/17 0902 12/04/17 0637  WBC 24.1* 25.2*  HGB 12.7* 11.2*  HCT 37.7* 35.0*  MCV 83.0 83.9  PLT 164 854*   Basic Metabolic Panel: Recent Labs  Lab  12/03/17 0902 12/04/17 0637  NA 133* 131*  K 4.0 3.8  CL 93* 93*  CO2 27 25  GLUCOSE 288* 281*  BUN 29* 33*  CREATININE 1.21 1.51*  CALCIUM 9.1 9.0   Liver Function Tests: Recent Labs  Lab 12/03/17 0902 12/03/17 1538  AST 19 15  ALT 16* 13*  ALKPHOS 79 69  BILITOT 0.8 0.9  PROT 6.6 6.1*  ALBUMIN 3.6 3.1*   Recent Labs  Lab 12/03/17 1538  LIPASE 23  AMYLASE 27*   No results for input(s):  AMMONIA in the last 168 hours.  Cardiac Enzymes: Recent Labs  Lab 12/03/17 0902 12/03/17 1538  TROPONINI 0.06* 0.07*   No results for input(s): PROBNP in the last 8760 hours.  CBG: Recent Labs  Lab 12/05/17 1624 12/05/17 2130 12/06/17 0726 12/06/17 1123 12/06/17 1626  GLUCAP 285* 355* 261* 177* 138*    Radiological Exams: US Aspiration  Result Date: 12/05/2017 INDICATION: 82 year old with a fluid collection surrounding epicardial pacer wires in left anterior abdominal wall. EXAM: ULTRASOUND-GUIDED ASPIRATION OF LEFT ABDOMINAL WALL FLUID COLLECTION MEDICATIONS: None ANESTHESIA/SEDATION: None COMPLICATIONS: None immediate. PROCEDURE: Informed written consent was obtained from the patient after a thorough discussion of the procedural risks, benefits and alternatives. All questions were addressed. A timeout was performed prior to the initiation of the procedure. The fluid collection in the left anterior abdominal wall was identified with ultrasound. This area was prepped with chlorhexidine and sterile field was created. Skin was anesthetized with 1% lidocaine. Using ultrasound guidance, an 18 gauge spinal needle was directed into the collection. Very thick brown fluid was aspirated. Very little fluid could be aspirated from this needle. Therefore, a 5 [place] Yueh catheter was directed into the collection with ultrasound guidance. Again, very thick brown fluid was removed. Approximately 30 mL of fluid was able to be removed. The entire collection could not be decompressed. Yueh  catheter was removed. Bandage placed over the puncture site. FINDINGS: Complex fluid collection surrounding epicardial pacer wires in left anterior abdominal wall. Yueh catheter was successfully advanced into the collection and very thick brown fluid was aspirated. 30 mL of fluid was removed. There was still a large amount of fluid remaining in this collection at the end of the procedure. IMPRESSION: Ultrasound-guided aspiration of the complex fluid collection in the left anterior abdominal wall surrounding epicardial pacer wires. Very thick brown fluid was removed from this collection and sent for culture. Based on the fluid consistency, the patient will likely need surgical intervention to remove all the fluid. Electronically Signed   By: Markus Daft M.D.   On: 12/05/2017 20:24    EKG: Independently reviewed. unchanged from previous tracings, paced rhythm.  Assessment/Plan 1.  Acute metabolic encephalopathy. Most likely toxic in the setting of polypharmacy. Patient received increased dose of Cymbalta, Lyrica, new medication tramadol as well as Zanaflex. Currently hypotensive. Remained lethargic throughout the day. ABG shows no hypercarbia mild hypoxia. Examination at bedside shows present breath sounds, no focal deficit, lethargy and drowsiness. With this recommend the patient transferred to [place]. Give IV fluid 500 cc normal saline bolus. Another 500 cc normal saline bolus of the blood pressure remains less than 90 systolic. 1 more dose of IV Narcan. 1 dose of IV glucagon for beta-blocker reversal. Chest x-ray. CT head. Checking stat CBC, BMP, lactic acid, ammonia, TSH, free T4. Troponin and CK-MB ordered after discussion with the cardiology. Also ordered continuous normal saline infusion at 100 cc/h.  2.  Acute on chronic systolic CHF. Patient was diuresed with 2.7 L negative so far during the stay based on ins and out. Because of patient's hypotension along with use of  beta-blocker. Patient is receiving IV hydration but need to be mindful about volume overload as well.  3.  acute kidney injury on chronic kidney disease stage III. Renal function on admission was showing GFR 55, serum creatinine 1.21.  On 12/04/2017 it was 1.51. Lab work pending this moment. Likely suspect that the patient has acute on chronic kidney disease from diuresis. Avoid nephrotoxic medications. Lasix and  Cozaar has been on hold since 12/06/2017  4.  Leukocytosis. Epicardial system pocket wall hematoma. Rectus sheath hematoma. Suspected epicardial pocket infection. Hb appears relatively stable. Will monitor. Ultrasound-guided aspiration was performed which shows no organisms so far. Blood cultures are also negative. Infectious disease consulted recommend no antibiotic right now. Cardio thoracic surgery was consulted, patient may require surgical removal of all the fluid.  5.  Type 2 diabetes mellitus. Uncontrolled with hyperglycemia Patient received 30 units of home regular insulin dose this morning for Lantus. Given that the patient will be n.p.o. for now will be switching to half dose of the Lantus to 15 units. Also recommend sensitive sliding scale every 4 hours. Patient may require D5 D10 infusion if becomes hypoglycemic  Addendum: Temp 102 rectally At present empiric Antibiotics is recommended.  Start vancomycin and zosyn.   Berle Mull 6:39 PM 12/06/2017    Family Communication: family at bedside, all questions answered satisfactorily. Primary team communication: Discussed with primary team as well.  Thank you very much for involving [place] in care of your patient.  We will continue to follow the patient.   The patient is critically ill with multiple organ systems failure and requires high complexity decision making for assessment and support, frequent evaluation and titration of therapies. Critical Care Time devoted to patient care services described in this note is  45 minutes   Author: Berle Mull, MD [facility] Pager: [phone] 12/06/2017 5:59 PM    If 7PM-7AM, please contact night-coverage [email or web address] Password [email or web address]

## 2017-12-06 NOTE — Progress Notes (Signed)
Pharmacy Antibiotic Note  Victor [name] is a 82 y.o. male admitted on 12/03/2017 with sepsis.  Pharmacy has been consulted for Vancomycin / Zosyn dosing.  1st doses ordered  Plan: Zosyn 3.375 grams iv Q 8 hours - 4 hr infusion Vancomycin 1 grams iv Q 24 hours  Height: 5\' 8"  (172.7 cm) Weight: 152 lb 8 oz (69.2 kg) IBW/kg (Calculated) : 68.4  Temp (24hrs), Avg:97.6 F (36.4 C), Min:97.5 F (36.4 C), Max:97.6 F (36.4 C)  Recent Labs  Lab 12/03/17 0902 12/04/17 0637  WBC 24.1* 25.2*  CREATININE 1.21 1.51*    Estimated Creatinine Clearance: 38.4 mL/min (A) (by C-G formula based on SCr of 1.51 mg/dL (H)).    Allergies  Allergen Reactions  . Coreg [Carvedilol] Other (See Comments)    Shortness of breath ,fatigue ,dizzyness   . Lovastatin Other (See Comments)    CK elevation, Myalgias  . Ativan [Lorazepam] Other (See Comments)    Pt. had side effects    Thank you for allowing pharmacy to be a part of this patient's care. Anette Guarneri, PharmD [phone] 12/06/2017 6:41 PM

## 2017-12-06 NOTE — Progress Notes (Signed)
Checked in on pt. He is sleeping, no obvious distress. He was confused earlier when he got up to BR. I spoke with the family and explained we have stopped all pain medication and muscle relaxer medication. For now we will keep him on this floor, the family was quite complimentary of the care provided by the staff here, especially his RN. If he become combative or hard to handle we may need to move him to step down. Hopefully he will be mentally clearer tomorrow as medications wear off.  Kerin Ransom PA-C 12/06/2017 4:25 PM

## 2017-12-06 NOTE — Progress Notes (Addendum)
Called to see pt because his family could not wake him up. B/P 90 systolic, O2 948% on 4L, BS WNL. The pt did respond and open his eyes and looked at me with vigorous stimulation but quickly appeared to go back to sleep. I have ordered an amp of Narcan and an ABG and will transfer him to step down. Diuretics stopped with low B/P. Rapid response called an on the scene. Discussed with Melina Copa PA who is on call- will ask internal medicine to see.   Kerin Ransom PA-C 12/06/2017 4:54 PM   I came to the floor shortly after the patient was seen by Kerin Ransom.  Upon my evaluation the patient was essentially obtunded but responsive to stimuli.  Intermittently respond.  He was hypotensive with blood pressure in the 70s.  He has responded somewhat to Narcan.  Currently getting IV fluid boluses. Per my recommendations we have consulted internal medicine for assistance as he is now febrile to 102 with this white count of 25.2.  I am concerned that he may very well have become septic, however my most leading diagnosis is this is related to overmedication.  I think he is somewhat dehydrated from diuresis and gentle hydration is probably not unreasonable.  At this point with blood pressures staying in the 70s-80s, I am concerned that he very well may need vasopressor support.  I have recommended that instead of going to the stepdown unit, he is transferred to the CCU.  That way, [facility] M is readily available if necessary we can potentially run pressors (would preferentially use Levophed).  As for his EKG, it clearly reads A. fib with possible anterior injury pattern, this although it looks clearly like a STEMI, it is also relatively similar to his EKG from January 13.  We will check troponin levels, but at this point given his level of responsiveness and concern for possible sepsis, he is not a candidate for urgent catheterization..  I would not treat with IV heparin unless he shows signs of troponin elevation.  There is  concern of possible rectus sheath hematoma from possible epicardial pacer wires, I would be reluctant to place on anticoagulation which would make this worse.  I would hold any antihypertensive agents.  We will give glucagon to potentially reverse bisoprolol from this morning.  Low threshold for considering antibiotics given fever.  This is being done by internal medicine service.  Appreciate their rapid consult.  Low threshold to escalate care to be CCM if he requires pressors etc.   Glenetta Hew, M.D., M.S. Interventional Cardiologist   Pager # [phone] Phone # [phone] [address] [place], [place] [address]

## 2017-12-07 ENCOUNTER — Inpatient Hospital Stay (HOSPITAL_COMMUNITY): Payer: Medicare Other

## 2017-12-07 ENCOUNTER — Encounter: Payer: Medicare Other | Admitting: *Deleted

## 2017-12-07 ENCOUNTER — Telehealth: Payer: Self-pay | Admitting: Cardiology

## 2017-12-07 DIAGNOSIS — S3662XD Contusion of rectum, subsequent encounter: Secondary | ICD-10-CM

## 2017-12-07 DIAGNOSIS — R509 Fever, unspecified: Secondary | ICD-10-CM

## 2017-12-07 DIAGNOSIS — I361 Nonrheumatic tricuspid (valve) insufficiency: Secondary | ICD-10-CM

## 2017-12-07 DIAGNOSIS — X58XXXD Exposure to other specified factors, subsequent encounter: Secondary | ICD-10-CM

## 2017-12-07 DIAGNOSIS — I351 Nonrheumatic aortic (valve) insufficiency: Secondary | ICD-10-CM

## 2017-12-07 DIAGNOSIS — I959 Hypotension, unspecified: Secondary | ICD-10-CM

## 2017-12-07 LAB — CBC WITH DIFFERENTIAL/PLATELET
BASOS ABS: 0 10*3/uL (ref 0.0–0.1)
Basophils Relative: 0 %
Eosinophils Absolute: 0.2 10*3/uL (ref 0.0–0.7)
Eosinophils Relative: 1 %
HEMATOCRIT: 34.3 % — AB (ref 39.0–52.0)
Hemoglobin: 11.1 g/dL — ABNORMAL LOW (ref 13.0–17.0)
LYMPHS PCT: 13 %
Lymphs Abs: 2.2 10*3/uL (ref 0.7–4.0)
MCH: 27.2 pg (ref 26.0–34.0)
MCHC: 32.4 g/dL (ref 30.0–36.0)
MCV: 84.1 fL (ref 78.0–100.0)
Monocytes Absolute: 1.4 10*3/uL — ABNORMAL HIGH (ref 0.1–1.0)
Monocytes Relative: 8 %
NEUTROS ABS: 13.7 10*3/uL — AB (ref 1.7–7.7)
Neutrophils Relative %: 78 %
PLATELETS: 170 10*3/uL (ref 150–400)
RBC: 4.08 MIL/uL — ABNORMAL LOW (ref 4.22–5.81)
RDW: 14.8 % (ref 11.5–15.5)
WBC: 17.4 10*3/uL — AB (ref 4.0–10.5)

## 2017-12-07 LAB — LACTIC ACID, PLASMA
LACTIC ACID, VENOUS: 1.4 mmol/L (ref 0.5–1.9)
Lactic Acid, Venous: 1.4 mmol/L (ref 0.5–1.9)

## 2017-12-07 LAB — ECHOCARDIOGRAM LIMITED
Area-P 1/2: 4.31 cm2
CHL CUP MV DEC (S): 173
CHL CUP TV REG PEAK VELOCITY: 297 cm/s
EWDT: 173 ms
FS: 21 % — AB (ref 28–44)
Height: 68 in
IVS/LV PW RATIO, ED: 0.81
LA ID, A-P, ES: 39 mm
LA diam end sys: 39 mm
LADIAMINDEX: 2.14 cm/m2
LDCA: 3.8 cm2
LVOTD: 22 mm
MV Peak grad: 4 mmHg
MV pk A vel: 42.2 m/s
MVPKEVEL: 105 m/s
P 1/2 time: 51 ms
PW: 11.4 mm — AB (ref 0.6–1.1)
RV sys press: 50 mmHg
TRMAXVEL: 297 cm/s
Weight: 2440 oz

## 2017-12-07 LAB — PROTEIN ELECTROPHORESIS, SERUM
A/G Ratio: 0.8 (ref 0.7–1.7)
Albumin ELP: 2.5 g/dL — ABNORMAL LOW (ref 2.9–4.4)
Alpha-1-Globulin: 0.5 g/dL — ABNORMAL HIGH (ref 0.0–0.4)
Alpha-2-Globulin: 1 g/dL (ref 0.4–1.0)
Beta Globulin: 0.8 g/dL (ref 0.7–1.3)
Gamma Globulin: 0.7 g/dL (ref 0.4–1.8)
Globulin, Total: 3 g/dL (ref 2.2–3.9)
M-Spike, %: 0.2 g/dL — ABNORMAL HIGH
Total Protein ELP: 5.5 g/dL — ABNORMAL LOW (ref 6.0–8.5)

## 2017-12-07 LAB — GLUCOSE, CAPILLARY
GLUCOSE-CAPILLARY: 123 mg/dL — AB (ref 65–99)
GLUCOSE-CAPILLARY: 111 mg/dL — AB (ref 65–99)
GLUCOSE-CAPILLARY: 304 mg/dL — AB (ref 65–99)
GLUCOSE-CAPILLARY: 347 mg/dL — AB (ref 65–99)
Glucose-Capillary: 196 mg/dL — ABNORMAL HIGH (ref 65–99)

## 2017-12-07 LAB — BASIC METABOLIC PANEL
Anion gap: 14 (ref 5–15)
BUN: 57 mg/dL — ABNORMAL HIGH (ref 6–20)
CO2: 22 mmol/L (ref 22–32)
Calcium: 8.3 mg/dL — ABNORMAL LOW (ref 8.9–10.3)
Chloride: 101 mmol/L (ref 101–111)
Creatinine, Ser: 2.51 mg/dL — ABNORMAL HIGH (ref 0.61–1.24)
GFR calc Af Amer: 26 mL/min — ABNORMAL LOW (ref >60)
GFR calc non Af Amer: 23 mL/min — ABNORMAL LOW (ref >60)
Glucose, Bld: 114 mg/dL — ABNORMAL HIGH (ref 65–99)
Potassium: 3.9 mmol/L (ref 3.5–5.1)
Sodium: 137 mmol/L (ref 135–145)

## 2017-12-07 LAB — TROPONIN I
TROPONIN I: 0.07 ng/mL — AB (ref <0.03)
Troponin I: 0.08 ng/mL (ref <0.03)
Troponin I: 0.1 ng/mL (ref <0.03)
Troponin I: 0.39 ng/mL (ref <0.03)

## 2017-12-07 LAB — MAGNESIUM: MAGNESIUM: 2.1 mg/dL (ref 1.7–2.4)

## 2017-12-07 LAB — MRSA PCR SCREENING: MRSA by PCR: NEGATIVE

## 2017-12-07 LAB — TSH: TSH: 0.411 u[IU]/mL (ref 0.350–4.500)

## 2017-12-07 MED ORDER — TRAZODONE HCL 50 MG PO TABS
50.0000 mg | ORAL_TABLET | Freq: Every evening | ORAL | Status: DC | PRN
  Administered 2017-12-07 – 2017-12-16 (×8): 50 mg via ORAL
  Filled 2017-12-07 (×8): qty 1

## 2017-12-07 MED ORDER — HEPARIN (PORCINE) IN NACL 100-0.45 UNIT/ML-% IJ SOLN
1100.0000 [IU]/h | INTRAMUSCULAR | Status: DC
Start: 2017-12-07 — End: 2017-12-08
  Administered 2017-12-07: 900 [IU]/h via INTRAVENOUS
  Filled 2017-12-07: qty 250

## 2017-12-07 MED ORDER — PERFLUTREN LIPID MICROSPHERE
1.0000 mL | INTRAVENOUS | Status: AC | PRN
  Administered 2017-12-07: 2 mL via INTRAVENOUS
  Filled 2017-12-07 (×2): qty 10

## 2017-12-07 MED ORDER — INSULIN ASPART 100 UNIT/ML ~~LOC~~ SOLN
0.0000 [IU] | Freq: Every day | SUBCUTANEOUS | Status: DC
  Administered 2017-12-07: 4 [IU] via SUBCUTANEOUS

## 2017-12-07 MED ORDER — SODIUM CHLORIDE 0.9 % IV SOLN: INTRAVENOUS | Status: DC

## 2017-12-07 MED ORDER — INSULIN ASPART 100 UNIT/ML ~~LOC~~ SOLN
0.0000 [IU] | Freq: Three times a day (TID) | SUBCUTANEOUS | Status: DC
  Administered 2017-12-07: 3 [IU] via SUBCUTANEOUS
  Administered 2017-12-07: 11 [IU] via SUBCUTANEOUS
  Administered 2017-12-08: 15 [IU] via SUBCUTANEOUS
  Administered 2017-12-08: 11 [IU] via SUBCUTANEOUS

## 2017-12-07 MED ORDER — HEPARIN BOLUS VIA INFUSION
3000.0000 [IU] | Freq: Once | INTRAVENOUS | Status: AC
  Administered 2017-12-07: 3000 [IU] via INTRAVENOUS
  Filled 2017-12-07: qty 3000

## 2017-12-07 MED ORDER — LIDOCAINE 5 % EX PTCH
2.0000 | MEDICATED_PATCH | CUTANEOUS | Status: DC
  Administered 2017-12-07 – 2017-12-16 (×9): 2 via TRANSDERMAL
  Filled 2017-12-07 (×13): qty 2

## 2017-12-07 MED ORDER — INSULIN DETEMIR 100 UNIT/ML ~~LOC~~ SOLN
10.0000 [IU] | Freq: Every day | SUBCUTANEOUS | Status: DC
  Administered 2017-12-07: 10 [IU] via SUBCUTANEOUS
  Filled 2017-12-07: qty 0.1

## 2017-12-07 NOTE — Progress Notes (Signed)
EKG CRITICAL VALUE     12 lead EKG performed.  Critical value noted.  Crystal, RN notified.   Genia Plants, CCT 12/07/2017 12:07 PM

## 2017-12-07 NOTE — Progress Notes (Signed)
Pt awake, alert, a lttle sleepy Remains in atrial fibrillation  Rates controlled EKG with inferolateral ST elevation Pt with CP that persists  Worse with pushing chest (as he has had) Troponin trivially elevated    Pt with diffuse CAD and occlusions to RCA and LCx   Echo today without signif change from previous   Etiology for events of yesterday  Still question medicine reaction (Ultram, muscle relaxant)  WOuld avoid in future  WIll plan to add heparin now for atrial fib.  Dorris Carnes

## 2017-12-07 NOTE — Progress Notes (Signed)
ANTICOAGULATION CONSULT NOTE - Initial Consult  Pharmacy Consult for Heparin Indication: atrial fibrillation  Allergies  Allergen Reactions  . Coreg [Carvedilol] Other (See Comments)    Shortness of breath ,fatigue ,dizzyness   . Lovastatin Other (See Comments)    CK elevation, Myalgias  . Ativan [Lorazepam] Other (See Comments)    Pt. had side effects    Patient Measurements: Height: 5\' 8"  (172.7 cm) Weight: 152 lb 8 oz (69.2 kg) IBW/kg (Calculated) : 68.4  Vital Signs: Temp: 97.6 F (36.4 C) (01/17 1651) Temp Source: Oral (01/17 1651) BP: 108/47 (01/17 1722) Pulse Rate: 101 (01/17 1722)  Labs: Recent Labs    12/05/17 1043  12/06/17 1806 12/06/17 1920 12/06/17 2334 12/07/17 0237 12/07/17 1106  HGB  --   --  10.3*  --   --  11.1*  --   HCT  --   --  30.5*  --   --  34.3*  --   PLT  --   --  158  --   --  170  --   LABPROT 15.2  --   --  15.7*  --   --   --   INR 1.21  --   --  1.26  --   --   --   CREATININE  --   --  2.62*  --   --  2.51*  --   CKTOTAL  --   --  20*  --   --   --   --   CKMB  --   --  1.0  --   --   --   --   TROPONINI  --    < > 0.13*  --  0.10* 0.39* 0.08*   < > = values in this interval not displayed.    Estimated Creatinine Clearance: 23.1 mL/min (A) (by C-G formula based on SCr of 2.51 mg/dL (H)).   Medical History: Past Medical History:  Diagnosis Date  . Anemia   . Arthritis   . Cardiomyopathy, ischemic    a. s/p ICD placement  . Chronic combined systolic and diastolic CHF (congestive heart failure) ([place])   . CKD (chronic kidney disease), stage III ([place])   . COPD (chronic obstructive pulmonary disease) ([place])   . Coronary artery disease    a. s/p CABG 1990. b. multiple PCIs since that time including CTO angioplasty (without stenting) PCI of RCA 09/2014, with last cath 02/2015 done for worsening CHF with unsuccessful attempt at PCI of the RCA due to inability to cross occlusion - medical therapy advised at that time.  Marland Kitchen GERD  (gastroesophageal reflux disease)   . HLD (hyperlipidemia)   . Hypertension   . Occlusion and stenosis of carotid artery without mention of cerebral infarction   . On home oxygen therapy    "2L; 24/7" (11/08/2017)  . Orthostatic hypotension   . Paroxysmal ventricular tachycardia ([place])   . Peripheral vascular disease ([place])   . Raynaud's syndrome   . Stroke [facility])    a. CT head 06/2015: Small old lacunar infarct LEFT basal ganglia.  . Thrombocytopenia ([place])   . TIA (transient ischemic attack)    a. s/p LHC on 10/01/14   . Type II diabetes mellitus ([facility])     Assessment: 82 year old male to begin heparin for Afib  Goal of Therapy:  Heparin level 0.3-0.7 units/ml Monitor platelets by anticoagulation protocol: Yes   Plan:  Heparin 3000 units iv bolus x 1 Heparin drip at  900 units / hr Daily heparin level, CBC  Thank you Anette Guarneri, PharmD [phone]  12/07/2017,6:00 PM

## 2017-12-07 NOTE — Progress Notes (Signed)
[place] [facility]   Reason for visit: Follow up on rectus hematoma  Interval History: now in cardiac ICU with episode of hypotension, confusion, fever following muscle relaxant; WBC down to 17; fever reported to be 102 but not recorded.  Physical Exam: Constitutional:  Vitals:   12/07/17 0702 12/07/17 0742  BP: 125/79   Pulse: (!) 109   Resp: (!) 25   Temp:  (!) 97.3 F (36.3 C)  SpO2: 100%    patient appears in NAD, alert, some confusion Eyes: anicteric HENT: no thrush Respiratory: Normal respiratory effort; CTA B Cardiovascular: RRR GI: soft, nt, nd  Review of Systems: Constitutional: negative for chills and anorexia Gastrointestinal: negative for diarrhea Integument/breast: negative for rash  Lab Results  Component Value Date   WBC 17.4 (H) 12/07/2017   HGB 11.1 (L) 12/07/2017   HCT 34.3 (L) 12/07/2017   MCV 84.1 12/07/2017   PLT 170 12/07/2017    Lab Results  Component Value Date   CREATININE 2.51 (H) 12/07/2017   BUN 57 (H) 12/07/2017   NA 137 12/07/2017   K 3.9 12/07/2017   CL 101 12/07/2017   CO2 22 12/07/2017    Lab Results  Component Value Date   ALT 33 12/06/2017   AST 50 (H) 12/06/2017   ALKPHOS 99 12/06/2017     Microbiology: Recent Results (from the past 240 hour(s))  Culture, blood (Routine X 2) w Reflex to ID Panel     Status: None (Preliminary result)   Collection Time: 12/04/17 12:40 PM  Result Value Ref Range Status   Specimen Description BLOOD RIGHT ANTECUBITAL  Final   Special Requests IN PEDIATRIC BOTTLE Blood Culture adequate volume  Final   Culture NO GROWTH 3 DAYS  Final   Report Status PENDING  Incomplete  Culture, blood (Routine X 2) w Reflex to ID Panel     Status: None (Preliminary result)   Collection Time: 12/04/17 12:44 PM  Result Value Ref Range Status   Specimen Description BLOOD RIGHT HAND  Final   Special Requests IN PEDIATRIC BOTTLE Blood Culture adequate volume  Final   Culture NO GROWTH 3  DAYS  Final   Report Status PENDING  Incomplete  Body fluid culture     Status: None (Preliminary result)   Collection Time: 12/05/17  3:35 PM  Result Value Ref Range Status   Specimen Description FLUID ABDOMEN  Final   Special Requests Normal  Final   Gram Stain NO WBC SEEN NO ORGANISMS SEEN   Final   Culture NO GROWTH < 24 HOURS  Final   Report Status PENDING  Incomplete  Culture, blood (x 2)     Status: None (Preliminary result)   Collection Time: 12/06/17  8:21 PM  Result Value Ref Range Status   Specimen Description BLOOD HAND LEFT  Final   Special Requests IN PEDIATRIC BOTTLE Blood Culture adequate volume  Final   Culture NO GROWTH < 12 HOURS  Final   Report Status PENDING  Incomplete  Culture, blood (x 2)     Status: None (Preliminary result)   Collection Time: 12/06/17  8:22 PM  Result Value Ref Range Status   Specimen Description BLOOD ARM RIGHT  Final   Special Requests IN PEDIATRIC BOTTLE Blood Culture adequate volume  Final   Culture NO GROWTH < 12 HOURS  Final   Report Status PENDING  Incomplete  MRSA PCR Screening     Status: None   Collection Time: 12/06/17  8:50 PM  Result Value Ref Range Status   MRSA by PCR NEGATIVE NEGATIVE Final    Comment:        The GeneXpert MRSA Assay (FDA approved for NASAL specimens only), is one component of a comprehensive MRSA colonization surveillance program. It is not intended to diagnose MRSA infection nor to guide or monitor treatment for MRSA infections.     Impression/Plan:  1. Fever - no signs of infection.  May be medication effect.  No indication for antibiotics without a source of infection.  I will stop antibiotics.    2.  Fluid collection - no growth to date. C/w hematoma.    3.  Leukocytosis - improved.    4.  Hypotension - resolved.   5.  Confusion - most likely from medications.    6.  Chest pain - intermittent and spasmodic.  Most c/w muscle spasm.

## 2017-12-07 NOTE — Progress Notes (Addendum)
[facility] Consultation Progress Note  Patient: Victor [name] AOZ:[id number]   PCP: Victor Bender, PA-C DOB: [birth year]   DOA: 12/03/2017   DOS: 12/07/2017   Date of Service: the patient was seen and examined on 12/07/2017 Primary service: Martinique, Peter M, MD   Subjective: Patient was unresponsive last night, overnight with improvement in mentation as well as blood pressure requiring IV Levophed. Currently Levophed is off, IV fluid is off per cardiology. Patient is awake and only complaint has chest pain. No nausea no vomiting. No abdominal pain as well.  Passing gas.  Feels thirsty.  Still has asterixis.  Brief hospital course: Victor [name] is a 82 y.o. male with Past medical history of CAD S/P CABG, ischemic cardiomyopathy with chronic combined CHF, chronic kidney disease stage III, arthritis, HTN, chronic respiratory failure on 2 L of oxygen, CVA, PVD, type II DM on chronic insulin. Patient is coming from [facility]. Patient presented with complaints of chest pain prior to be musculoskeletal without any benefit from nitroglycerin.  CT PE in the other facility was negative for dissection or PE.  Patient was transferred to [facility] for further workup for his chest pain as well as the finding of fluid surrounding epicardial pacer. During the stay in the hospital patient continues to have chest pain as well as diffuse abdominal pain.  Interventional radiology as well as cardio thoracic surgery was consulted for fluid collection. Patient underwent ultrasound-guided fluid aspiration on 12/05/2017 30 mL fluid brown thick was removed.  Still large amount of fluid remains in the pocket which will require surgical removal. Patient's Cymbalta was increased from 30 mg daily to 60 mg daily.  50 mg 3 times daily as needed, last dose was on 9.11 on 12/06/2017.  Zanaflex was also started last dose was on 11.22 on 12/06/2017. On 12/06/2017 after receiving Zanaflex and tramadol the patient  become more lethargic and sleepy and drowsy.  Later during the day the patient was not able to stay awake.  Rapid response was called for further assistance.  Hospitalist was called for further management of acute encephalopathy. At the time of my evaluation rapid response nurse was at bedside, patient was drowsy and lethargic but arousable on verbal command.  Nonverbal following commands and falling asleep during the conversation.  Blood glucose was 138, blood pressure was 76/42 without any tachycardia. Transferred to ICU on 12/06/2017, started on levo fed and IV fluid with improvement in mentation as well as blood pressure.  Currently further plan is [name] supportive care.  Assessment and Plan: 1.  Acute metabolic encephalopathy. toxic in the setting of polypharmacy. Patient received increased dose of Cymbalta, Lyrica, new medication tramadol as well as Zanaflex. Remained lethargic throughout the day on 12/06/2017. On 12/07/2017, improved mentation still has asterixis. ABG shows no hypercarbia mild hypoxia. Patient received IV Narcan without any significant benefit, also received 1 dose of IV glucagon for beta-blocker reversal. Persistent mild encephalopathy now is more likely secondary to acute kidney injury with azotemia.  2.  Acute on chronic systolic CHF. Patient was diuresed with 2.7 L negative so far during the stay based on ins and out. Received 2.7 L IV fluid on 12/06/2017, still negative for hospital stay. I recommended to hold diuresis as well as ACE inhibitor and ARB until renal function normalizes.  3.  acute kidney injury on chronic kidney disease stage III. Renal function on admission was showing GFR 55, serum creatinine 1.21.  On 12/04/2017 it was 1.51. Creatinine yesterday  was 2.62, today 2.51.  Adequate urine output. Likely prerenal etiology with over diuresis.  There is a potential for it to further worsen due to hypotensive episode yesterday with possible ATN. Continue to  Avoid nephrotoxic medications. Lasix and Cozaar has been on hold since 12/06/2017 Recommend continue gentle IV hydration at 50 cc/h at least.  4.  Leukocytosis. Epicardial system pocket wall hematoma. Rectus sheath hematoma. Suspected epicardial pocket infection. Chest wall pain Hb appears relatively stable. Will monitor. Ultrasound-guided aspiration was performed which shows no organisms so far. Blood cultures are also negative. Infectious disease consulted recommend no antibiotic right now. Cardio thoracic surgery was consulted, patient may require surgical removal of all the fluid. With rectal temp of 102 patient was empirically started on IV antibiotics with suspicion for sepsis. Significant improvement overnight with supportive measures, less likely sepsis.  Discontinue antibiotics.  Blood culture negative for 12 hours. Pain control with k pad and lidocaine patch. Avoid psychotropic meds, still has asterixis.   5.  Type 2 diabetes mellitus. Uncontrolled with hyperglycemia Patient's home dose 30 units of Lantus. Dose was reduced to Lantus to 15 units with sensitive sliding scale every 4 hours. Now that the patient is more awake, recommend to advance diet as tolerated. Will change the sliding scale from every 4 hours to before meals at bedtime.  Continue 15 units of Lantus right now.   Noted that the patient was hyperglycemic before all this happened.  5. ST elevation on EKG H/O CAD Management per cardiology,.  EKG changes not consistent with STEMI  Bowel regimen: last BM 12/06/2016 Diet: advance ad tolerated to cardiac and carb modified diet DVT Prophylaxis: subcutaneous Heparin  Family Communication: no family was present at bedside, at the time of interview.   Disposition: We will continue to follow the patient.   Recommendation on discharge: Pending further improvement  Other Consultants: ID, CCM Procedures:  Antibiotics: Anti-infectives (From admission, onward)    Start     Dose/Rate Route Frequency Ordered Stop   12/07/17 1830  vancomycin (VANCOCIN) IVPB 1000 mg/200 mL premix     1,000 mg 200 mL/hr over 60 Minutes Intravenous Every 24 hours 12/06/17 1843     12/07/17 0400  piperacillin-tazobactam (ZOSYN) IVPB 3.375 g     3.375 g 12.5 mL/hr over 240 Minutes Intravenous Every 8 hours 12/06/17 1843     12/06/17 1900  piperacillin-tazobactam (ZOSYN) IVPB 3.375 g     3.375 g 100 mL/hr over 30 Minutes Intravenous  Once 12/06/17 1839 12/06/17 2030   12/06/17 1900  vancomycin (VANCOCIN) IVPB 1000 mg/200 mL premix     1,000 mg 200 mL/hr over 60 Minutes Intravenous  Once 12/06/17 1839 12/06/17 2140      Objective: Physical Exam: Vitals:   12/07/17 0615 12/07/17 0630 12/07/17 0702 12/07/17 0742  BP: 128/69 122/68 125/79   Pulse: 95 94 (!) 109   Resp: (!) 24 (!) 24 (!) 25   Temp:    (!) 97.3 F (36.3 C)  TempSrc:    Oral  SpO2: 100% 100% 100%   Weight:      Height:        Intake/Output Summary (Last 24 hours) at 12/07/2017 0907 Last data filed at 12/07/2017 0700 Gross per 24 hour  Intake 2759.69 ml  Output 1035 ml  Net 1724.69 ml   Filed Weights   12/04/17 0217 12/05/17 0546 12/06/17 0357  Weight: 71.9 kg (158 lb 8.2 oz) 71.3 kg (157 lb 3 oz) 69.2 kg (152 lb 8  oz)   General: Alert, Awake and Oriented to Time, Place and Person. Appear in moderate distress, affect appropriate, joking with staff, although does not remember events of yesterday. Eyes: PERRL, Conjunctiva normal ENT: Oral Mucosa clear dry. Neck: difficult to assess JVD, no Abnormal Mass Or lumps Cardiovascular: S1 and S2 Present, no Murmur, Peripheral Pulses Present Respiratory: normal respiratory effort, Bilateral Air entry equal and Decreased, no use of accessory muscle, Clear to Auscultation, no Crackles, no wheezes Abdomen: Bowel Sound present, Soft and no tenderness, no hernia Skin: no redness, no Rash, no induration Extremities: no Pedal edema, no calf  tenderness Neurologic: Grossly no focal neuro deficit. Bilaterally Equal motor strength Asterixis present bilaterally.  Data Reviewed: CBC: Recent Labs  Lab 12/03/17 0902 12/04/17 0637 12/06/17 1806 12/07/17 0237  WBC 24.1* 25.2* 15.0* 17.4*  NEUTROABS  --   --  12.6* 13.7*  HGB 12.7* 11.2* 10.3* 11.1*  HCT 37.7* 35.0* 30.5* 34.3*  MCV 83.0 83.9 83.1 84.1  PLT 164 129* 158 258   Basic Metabolic Panel: Recent Labs  Lab 12/03/17 0902 12/04/17 0637 12/06/17 1806 12/07/17 0237  NA 133* 131* 135 137  K 4.0 3.8 3.6 3.9  CL 93* 93* 96* 101  CO2 27 25 26 22   GLUCOSE 288* 281* 100* 114*  BUN 29* 33* 52* 57*  CREATININE 1.21 1.51* 2.62* 2.51*  CALCIUM 9.1 9.0 8.3* 8.3*  MG  --   --   --  2.1   Liver Function Tests: Recent Labs  Lab 12/03/17 0902 12/03/17 1538 12/06/17 1806  AST 19 15 50*  ALT 16* 13* 33  ALKPHOS 79 69 99  BILITOT 0.8 0.9 0.6  PROT 6.6 6.1* 5.3*  ALBUMIN 3.6 3.1* 2.3*   Recent Labs  Lab 12/03/17 1538  LIPASE 23  AMYLASE 27*   Recent Labs  Lab 12/06/17 1806  AMMONIA 13   Cardiac Enzymes: Recent Labs  Lab 12/03/17 0902 12/03/17 1538 12/06/17 1806 12/06/17 2334 12/07/17 0237  CKTOTAL  --   --  20*  --   --   CKMB  --   --  1.0  --   --   TROPONINI 0.06* 0.07* 0.13* 0.10* 0.39*   BNP (last 3 results) Recent Labs    07/16/17 2041 09/05/17 1434 11/08/17 0928  BNP 1,436.0* 1,195.5* 796.6*   CBG: Recent Labs  Lab 12/06/17 1626 12/06/17 1957 12/06/17 2346 12/07/17 0349 12/07/17 0737  GLUCAP 138* 79 118* 123* 111*   Recent Results (from the past 240 hour(s))  Culture, blood (Routine X 2) w Reflex to ID Panel     Status: None (Preliminary result)   Collection Time: 12/04/17 12:40 PM  Result Value Ref Range Status   Specimen Description BLOOD RIGHT ANTECUBITAL  Final   Special Requests IN PEDIATRIC BOTTLE Blood Culture adequate volume  Final   Culture NO GROWTH 3 DAYS  Final   Report Status PENDING  Incomplete  Culture, blood  (Routine X 2) w Reflex to ID Panel     Status: None (Preliminary result)   Collection Time: 12/04/17 12:44 PM  Result Value Ref Range Status   Specimen Description BLOOD RIGHT HAND  Final   Special Requests IN PEDIATRIC BOTTLE Blood Culture adequate volume  Final   Culture NO GROWTH 3 DAYS  Final   Report Status PENDING  Incomplete  Body fluid culture     Status: None (Preliminary result)   Collection Time: 12/05/17  3:35 PM  Result Value Ref Range Status  Specimen Description FLUID ABDOMEN  Final   Special Requests Normal  Final   Gram Stain NO WBC SEEN NO ORGANISMS SEEN   Final   Culture NO GROWTH < 24 HOURS  Final   Report Status PENDING  Incomplete  Culture, blood (x 2)     Status: None (Preliminary result)   Collection Time: 12/06/17  8:21 PM  Result Value Ref Range Status   Specimen Description BLOOD HAND LEFT  Final   Special Requests IN PEDIATRIC BOTTLE Blood Culture adequate volume  Final   Culture NO GROWTH < 12 HOURS  Final   Report Status PENDING  Incomplete  Culture, blood (x 2)     Status: None (Preliminary result)   Collection Time: 12/06/17  8:22 PM  Result Value Ref Range Status   Specimen Description BLOOD ARM RIGHT  Final   Special Requests IN PEDIATRIC BOTTLE Blood Culture adequate volume  Final   Culture NO GROWTH < 12 HOURS  Final   Report Status PENDING  Incomplete  MRSA PCR Screening     Status: None   Collection Time: 12/06/17  8:50 PM  Result Value Ref Range Status   MRSA by PCR NEGATIVE NEGATIVE Final    Comment:        The GeneXpert MRSA Assay (FDA approved for NASAL specimens only), is one component of a comprehensive MRSA colonization surveillance program. It is not intended to diagnose MRSA infection nor to guide or monitor treatment for MRSA infections.     Studies: Dg Chest Port 1 View  Result Date: 12/06/2017 CLINICAL DATA:  Shortness of breath EXAM: PORTABLE CHEST 1 VIEW COMPARISON:  12/03/2017 FINDINGS: Bilateral diffuse mild  interstitial thickening. No pleural effusion or pneumothorax. No focal consolidation. Stable cardiomegaly. Prior CABG. Single lead cardiac pacemaker. No acute osseous abnormality. IMPRESSION: Cardiomegaly with pulmonary vascular congestion. Electronically Signed   By: Kathreen Devoid   On: 12/06/2017 20:18     Scheduled Meds: . aspirin EC  81 mg Oral QHS  . clopidogrel  75 mg Oral Daily  . insulin aspart  0-9 Units Subcutaneous Q4H  . insulin glargine  15 Units Subcutaneous Daily  . pravastatin  40 mg Oral q1800    Continuous Infusions: . sodium chloride 100 mL/hr at 12/07/17 0028  . piperacillin-tazobactam (ZOSYN)  IV 3.375 g (12/07/17 0436)  . vancomycin     PRN Meds: acetaminophen, nitroGLYCERIN, ondansetron (ZOFRAN) IV  Time spent: 35 minutes  Author: Berle Mull, MD [facility] Pager: [phone] 12/07/2017 9:07 AM  If 7PM-7AM, please contact night-coverage at [email or web address], password [facility]

## 2017-12-07 NOTE — Progress Notes (Signed)
EKG CRITICAL VALUE     12 lead EKG performed.  Critical value noted.hyiu RN notified.   Delfin Edis, [name] 12/07/2017 12:07 PM

## 2017-12-07 NOTE — Telephone Encounter (Signed)
LMOVM reminding pt to send remote transmission.   

## 2017-12-07 NOTE — Progress Notes (Addendum)
Progress Note  Patient Name: Victor [name] Date of Encounter: 12/07/2017  Primary Cardiologist: Peter Martinique, MD   Subjective   Breathing OK  Chest hurts (chest wall, worse with breath) "I want a ginger ale"  Inpatient Medications    Scheduled Meds: . aspirin EC  81 mg Oral QHS  . clopidogrel  75 mg Oral Daily  . insulin aspart  0-9 Units Subcutaneous Q4H  . insulin glargine  15 Units Subcutaneous Daily  . pravastatin  40 mg Oral q1800   Continuous Infusions: . sodium chloride 100 mL/hr at 12/07/17 0028  . norepinephrine (LEVOPHED) Adult infusion Stopped (12/07/17 0415)  . piperacillin-tazobactam (ZOSYN)  IV 3.375 g (12/07/17 0436)  . sodium chloride    . sodium chloride    . vancomycin     PRN Meds: acetaminophen, nitroGLYCERIN, ondansetron (ZOFRAN) IV, sodium chloride   Vital Signs    Vitals:   12/07/17 0615 12/07/17 0630 12/07/17 0702 12/07/17 0742  BP: 128/69 122/68 125/79   Pulse: 95 94 (!) 109   Resp: (!) 24 (!) 24 (!) 25   Temp:    (!) 97.3 F (36.3 C)  TempSrc:    Oral  SpO2: 100% 100% 100%   Weight:      Height:        Intake/Output Summary (Last 24 hours) at 12/07/2017 0849 Last data filed at 12/07/2017 0700 Gross per 24 hour  Intake 2759.69 ml  Output 1035 ml  Net 1724.69 ml   Filed Weights   12/04/17 0217 12/05/17 0546 12/06/17 0357  Weight: 158 lb 8.2 oz (71.9 kg) 157 lb 3 oz (71.3 kg) 152 lb 8 oz (69.2 kg)    Telemetry      ECG      Physical Exam   GEN: No acute distress.   Neck: No JVD Cardiac: RRR, no murmurs, rubs, or gallops.  Chest  Tender on L lateral wall   Respiratory: Clear to auscultation bilaterally.anteriorly   GI: Soft, mild diffuse tendernes (very mild), non-distended  MS: No edema; No deformity. Neuro:  Nonfocal  Psych: Normal affect   Labs    Chemistry Recent Labs  Lab 12/03/17 0902 12/03/17 1538 12/04/17 0637 12/06/17 1806 12/07/17 0237  NA 133*  --  131* 135 137  K 4.0  --  3.8 3.6 3.9  CL 93*   --  93* 96* 101  CO2 27  --  25 26 22   GLUCOSE 288*  --  281* 100* 114*  BUN 29*  --  33* 52* 57*  CREATININE 1.21  --  1.51* 2.62* 2.51*  CALCIUM 9.1  --  9.0 8.3* 8.3*  PROT 6.6 6.1*  --  5.3*  --   ALBUMIN 3.6 3.1*  --  2.3*  --   AST 19 15  --  50*  --   ALT 16* 13*  --  33  --   ALKPHOS 79 69  --  99  --   BILITOT 0.8 0.9  --  0.6  --   GFRNONAA 55*  --  42* 22* 23*  GFRAA >60  --  49* 25* 26*  ANIONGAP 13  --  13 13 14      Hematology Recent Labs  Lab 12/04/17 0637 12/06/17 1806 12/07/17 0237  WBC 25.2* 15.0* 17.4*  RBC 4.17* 3.67* 4.08*  HGB 11.2* 10.3* 11.1*  HCT 35.0* 30.5* 34.3*  MCV 83.9 83.1 84.1  MCH 26.9 28.1 27.2  MCHC 32.0 33.8 32.4  RDW 15.0  14.5 14.8  PLT 129* 158 170    Cardiac Enzymes Recent Labs  Lab 12/03/17 1538 12/06/17 1806 12/06/17 2334 12/07/17 0237  TROPONINI 0.07* 0.13* 0.10* 0.39*   No results for input(s): TROPIPOC in the last 168 hours.   BNPNo results for input(s): BNP, PROBNP in the last 168 hours.   DDimer No results for input(s): DDIMER in the last 168 hours.   Radiology    US Aspiration  Result Date: 12/05/2017 INDICATION: 82 year old with a fluid collection surrounding epicardial pacer wires in left anterior abdominal wall. EXAM: ULTRASOUND-GUIDED ASPIRATION OF LEFT ABDOMINAL WALL FLUID COLLECTION MEDICATIONS: None ANESTHESIA/SEDATION: None COMPLICATIONS: None immediate. PROCEDURE: Informed written consent was obtained from the patient after a thorough discussion of the procedural risks, benefits and alternatives. All questions were addressed. A timeout was performed prior to the initiation of the procedure. The fluid collection in the left anterior abdominal wall was identified with ultrasound. This area was prepped with chlorhexidine and sterile field was created. Skin was anesthetized with 1% lidocaine. Using ultrasound guidance, an 18 gauge spinal needle was directed into the collection. Very thick brown fluid was  aspirated. Very little fluid could be aspirated from this needle. Therefore, a 5 [place] Yueh catheter was directed into the collection with ultrasound guidance. Again, very thick brown fluid was removed. Approximately 30 mL of fluid was able to be removed. The entire collection could not be decompressed. Yueh catheter was removed. Bandage placed over the puncture site. FINDINGS: Complex fluid collection surrounding epicardial pacer wires in left anterior abdominal wall. Yueh catheter was successfully advanced into the collection and very thick brown fluid was aspirated. 30 mL of fluid was removed. There was still a large amount of fluid remaining in this collection at the end of the procedure. IMPRESSION: Ultrasound-guided aspiration of the complex fluid collection in the left anterior abdominal wall surrounding epicardial pacer wires. Very thick brown fluid was removed from this collection and sent for culture. Based on the fluid consistency, the patient will likely need surgical intervention to remove all the fluid. Electronically Signed   By: Markus Daft M.D.   On: 12/05/2017 20:24   Dg Chest Port 1 View  Result Date: 12/06/2017 CLINICAL DATA:  Shortness of breath EXAM: PORTABLE CHEST 1 VIEW COMPARISON:  12/03/2017 FINDINGS: Bilateral diffuse mild interstitial thickening. No pleural effusion or pneumothorax. No focal consolidation. Stable cardiomegaly. Prior CABG. Single lead cardiac pacemaker. No acute osseous abnormality. IMPRESSION: Cardiomegaly with pulmonary vascular congestion. Electronically Signed   By: Kathreen Devoid   On: 12/06/2017 20:18    Cardiac Studies     Patient Profile     82 y.o. male past medical history of CAD, s/p CABG, chronic combined systolic/diastolic congestive heart failure, ischemic cardiomyopathy-last EF 35-40%, history of VT status post ICD, hypertension, diabetes mellitus, chronic stage III kidney disease, and PVD admitted with continuous chest pain and abdominal pain. CT  scan at an outside hospital showed fluid collection around ICD wires. Troponin minimally elevated.    Assessment & Plan    1  Neuro  Events of yesterday PM noted  Pt markedly improved now from yesterday   Etiology not clear though he was transiently febrile  WBC up  Now coming down  On ABX now.  Has gotten IV fluids  No pain meds Plan to follow in ICU for at least part of day  2  Chest  Appears musculoskel. Pain  Spasm witnessed by a couple providers  Need to review with pharmacy options  3  Abnormal EKG   EKG from yesterday pm markedly different  With ST elevation inferior and laterally   Now without new chest pains (otherthan focal chest wall pain)  No rub  Triv elevation in trop  Also atrial fibrillation is new REcom:   Check troponins Limited echo to compare wall motion to previous (was abnormal before.  EKG changes may reflect strain in location of previous infarct) Stop plavix  Start heparin  4  Atrial fib  New that I can see   Follow for right now  On ASA / plavix  Review with EP anticoag with hematoma Echo TSH     4  ID  Now on Zosyn and Vanco empirically  WBC down from yesterday   Hematoma ID is following    5  REnal  Marked bump in Cr yesterday  ACE and diuretic held yesterday  Cr 2.62 yesterday   Now it is 2.52   ? Related to hypotension.  Will follow   See how he does today with eating, drinking  Recheck in PM  Avoid drugs that are nephrotoxic  For questions or updates, please contact [place] Please consult [email or web address] for contact info under Cardiology/STEMI.      Signed, Dorris Carnes, MD  12/07/2017, 8:49 AM

## 2017-12-07 NOTE — Progress Notes (Signed)
  Echocardiogram 2D Echocardiogram has been performed.  Randa Lynn Tyrica Afzal 12/07/2017, 4:24 PM

## 2017-12-08 ENCOUNTER — Encounter: Payer: Self-pay | Admitting: Cardiology

## 2017-12-08 ENCOUNTER — Encounter (HOSPITAL_COMMUNITY): Payer: Self-pay

## 2017-12-08 LAB — CBC
HEMATOCRIT: 31 % — AB (ref 39.0–52.0)
HEMOGLOBIN: 10.2 g/dL — AB (ref 13.0–17.0)
MCH: 27.2 pg (ref 26.0–34.0)
MCHC: 32.9 g/dL (ref 30.0–36.0)
MCV: 82.7 fL (ref 78.0–100.0)
Platelets: 145 10*3/uL — ABNORMAL LOW (ref 150–400)
RBC: 3.75 MIL/uL — ABNORMAL LOW (ref 4.22–5.81)
RDW: 14.5 % (ref 11.5–15.5)
WBC: 12.1 10*3/uL — AB (ref 4.0–10.5)

## 2017-12-08 LAB — BASIC METABOLIC PANEL
Anion gap: 12 (ref 5–15)
BUN: 43 mg/dL — ABNORMAL HIGH (ref 6–20)
CO2: 24 mmol/L (ref 22–32)
Calcium: 8.2 mg/dL — ABNORMAL LOW (ref 8.9–10.3)
Chloride: 97 mmol/L — ABNORMAL LOW (ref 101–111)
Creatinine, Ser: 1.62 mg/dL — ABNORMAL HIGH (ref 0.61–1.24)
GFR calc Af Amer: 45 mL/min — ABNORMAL LOW (ref >60)
GFR calc non Af Amer: 39 mL/min — ABNORMAL LOW (ref >60)
Glucose, Bld: 309 mg/dL — ABNORMAL HIGH (ref 65–99)
Potassium: 3.7 mmol/L (ref 3.5–5.1)
Sodium: 133 mmol/L — ABNORMAL LOW (ref 135–145)

## 2017-12-08 LAB — GLUCOSE, CAPILLARY
GLUCOSE-CAPILLARY: 361 mg/dL — AB (ref 65–99)
GLUCOSE-CAPILLARY: 303 mg/dL — AB (ref 65–99)
Glucose-Capillary: 222 mg/dL — ABNORMAL HIGH (ref 65–99)
Glucose-Capillary: 46 mg/dL — ABNORMAL LOW (ref 65–99)
Glucose-Capillary: 62 mg/dL — ABNORMAL LOW (ref 65–99)

## 2017-12-08 LAB — URINE CULTURE: Culture: NO GROWTH

## 2017-12-08 LAB — HEMOGLOBIN A1C
Hgb A1c MFr Bld: 8.8 % — ABNORMAL HIGH (ref 4.8–5.6)
Mean Plasma Glucose: 205.86 mg/dL

## 2017-12-08 LAB — HEPARIN LEVEL (UNFRACTIONATED)

## 2017-12-08 MED ORDER — INSULIN ASPART 100 UNIT/ML ~~LOC~~ SOLN: 0.0000 [IU] | Freq: Every day | SUBCUTANEOUS | Status: DC

## 2017-12-08 MED ORDER — INSULIN ASPART 100 UNIT/ML ~~LOC~~ SOLN
5.0000 [IU] | Freq: Three times a day (TID) | SUBCUTANEOUS | Status: DC
  Administered 2017-12-08: 5 [IU] via SUBCUTANEOUS

## 2017-12-08 MED ORDER — FUROSEMIDE 10 MG/ML IJ SOLN
40.0000 mg | Freq: Once | INTRAMUSCULAR | Status: AC
  Administered 2017-12-08: 40 mg via INTRAVENOUS
  Filled 2017-12-08: qty 4

## 2017-12-08 MED ORDER — APIXABAN 2.5 MG PO TABS
2.5000 mg | ORAL_TABLET | Freq: Two times a day (BID) | ORAL | Status: DC
  Administered 2017-12-08 – 2017-12-09 (×3): 2.5 mg via ORAL
  Filled 2017-12-08 (×3): qty 1

## 2017-12-08 MED ORDER — HEPARIN BOLUS VIA INFUSION
2000.0000 [IU] | Freq: Once | INTRAVENOUS | Status: AC
  Administered 2017-12-08: 2000 [IU] via INTRAVENOUS
  Filled 2017-12-08: qty 2000

## 2017-12-08 MED ORDER — WHITE PETROLATUM EX OINT
TOPICAL_OINTMENT | CUTANEOUS | Status: AC
  Filled 2017-12-08: qty 28.35

## 2017-12-08 MED ORDER — POTASSIUM CHLORIDE CRYS ER 20 MEQ PO TBCR
20.0000 meq | EXTENDED_RELEASE_TABLET | Freq: Two times a day (BID) | ORAL | Status: DC
  Administered 2017-12-08: 20 meq via ORAL
  Filled 2017-12-08: qty 1

## 2017-12-08 MED ORDER — HYDROMORPHONE HCL 1 MG/ML IJ SOLN
0.5000 mg | Freq: Once | INTRAMUSCULAR | Status: AC
  Administered 2017-12-08: 0.5 mg via INTRAVENOUS
  Filled 2017-12-08: qty 1

## 2017-12-08 MED ORDER — HYDROMORPHONE HCL 1 MG/ML IJ SOLN
0.5000 mg | INTRAMUSCULAR | Status: DC | PRN
  Administered 2017-12-08 – 2017-12-11 (×12): 0.5 mg via INTRAVENOUS
  Filled 2017-12-08 (×2): qty 0.5
  Filled 2017-12-08 (×2): qty 1
  Filled 2017-12-08 (×2): qty 0.5
  Filled 2017-12-08: qty 1
  Filled 2017-12-08: qty 0.5
  Filled 2017-12-08: qty 1
  Filled 2017-12-08 (×2): qty 0.5

## 2017-12-08 MED ORDER — INSULIN DETEMIR 100 UNIT/ML ~~LOC~~ SOLN
30.0000 [IU] | Freq: Every day | SUBCUTANEOUS | Status: DC
  Filled 2017-12-08: qty 0.3

## 2017-12-08 MED ORDER — METOPROLOL TARTRATE 12.5 MG HALF TABLET
12.5000 mg | ORAL_TABLET | Freq: Four times a day (QID) | ORAL | Status: DC
  Administered 2017-12-08 – 2017-12-10 (×9): 12.5 mg via ORAL
  Filled 2017-12-08 (×7): qty 1

## 2017-12-08 MED ORDER — INSULIN ASPART 100 UNIT/ML ~~LOC~~ SOLN: 0.0000 [IU] | Freq: Three times a day (TID) | SUBCUTANEOUS | Status: DC

## 2017-12-08 MED ORDER — INSULIN ASPART 100 UNIT/ML ~~LOC~~ SOLN
0.0000 [IU] | Freq: Three times a day (TID) | SUBCUTANEOUS | Status: DC
  Administered 2017-12-08: 7 [IU] via SUBCUTANEOUS

## 2017-12-08 NOTE — Progress Notes (Signed)
His culture of the abdomina wall fluid has remained negative, his WBC has nearly normalized without further treatment and no other signs of infection.  At this time I will sign off.  Thanks for consultation.   Thayer Headings, MD

## 2017-12-08 NOTE — Evaluation (Signed)
Physical Therapy Evaluation Patient Details Name: Victor [name] MRN: [id number] DOB: [birth year] Today's Date: 12/08/2017   History of Present Illness  Pt is a 82 y.o. male admitted from [facility] on 12/03/17 for workup for chest pain and fluid surrounding epicardial pacer. CT negative for dissection or PE. Underwent ultrasound-guided fluid aspiration 1/15. Rapid response called 1/16 due to decreased responsiveness with hypotension. Pt also with AKI on CKD III. PMH includes CHF, DMII, CAD, PVD, COPD, cardiomyopathy s/p ICD placement.     Clinical Impression  Pt presents with an overall decrease in functional mobility secondary to above. PTA, pt mod indep with SPC/RW and lives with family available for 24/7 support. Today, able to transfer and amb with RW and supervision for safety. Educ on energy conservation, activity tolerance, and fall risk reduction. Pt would benefit from continued acute PT services to maximize functional mobility and independence prior to d/c with HHPT services.     Follow Up Recommendations Home health PT;Supervision/Assistance - 24 hour    Equipment Recommendations  None recommended by PT    Recommendations for Other Services       Precautions / Restrictions Precautions Precautions: Fall Restrictions Weight Bearing Restrictions: No      Mobility  Bed Mobility Overal bed mobility: Independent                Transfers Overall transfer level: Needs assistance Equipment used: Rolling walker (2 wheeled) Transfers: Sit to/from Stand Sit to Stand: Supervision            Ambulation/Gait Ambulation/Gait assistance: Supervision Ambulation Distance (Feet): 300 Feet Assistive device: Rolling walker (2 wheeled) Gait Pattern/deviations: Step-through pattern;Decreased stride length;Trunk flexed Gait velocity: Decreased Gait velocity interpretation: <1.8 ft/sec, indicative of risk for recurrent falls General Gait Details: 1x seated rest break  secondary to fatigue and c/o chest soreness. Supervision for safety. Cues to look forward   Stairs            Wheelchair Mobility    Modified Rankin (Stroke Patients Only)       Balance Overall balance assessment: Needs assistance   Sitting balance-Leahy Scale: Good Sitting balance - Comments: Don socks/shoes indep sitting EOB     Standing balance-Leahy Scale: Fair Standing balance comment: Can static stand with no UE support; reliant on UE support for dynamic balance                             Pertinent Vitals/Pain Pain Assessment: Faces Faces Pain Scale: Hurts little more Pain Location: Chest Pain Descriptors / Indicators: Sore Pain Intervention(s): Monitored during session    Home Living Family/patient expects to be discharged to:: Private residence Living Arrangements: Other relatives Available Help at Discharge: Family;Available 24 hours/day Type of Home: House Home Access: Stairs to enter Entrance Stairs-Rails: Psychiatric nurse of Steps: 3 Home Layout: One level Home Equipment: Walker - 2 wheels;Walker - 4 wheels;Bedside commode;Shower seat;Electric scooter      Prior Function Level of Independence: Independent with assistive device(s)         Comments: Mod indep with rollator or SPC inside home. Uses electric scooter for longer distances outside     Hand Dominance        Extremity/Trunk Assessment   Upper Extremity Assessment Upper Extremity Assessment: Overall WFL for tasks assessed    Lower Extremity Assessment Lower Extremity Assessment: Generalized weakness       Communication   Communication: No difficulties  Cognition Arousal/Alertness:  Awake/alert Behavior During Therapy: WFL for tasks assessed/performed Overall Cognitive Status: Within Functional Limits for tasks assessed                                        General Comments General comments (skin integrity, edema, etc.):  Daughter present throughout session    Exercises     Assessment/Plan    PT Assessment Patient needs continued PT services  PT Problem List Decreased strength;Decreased balance;Decreased mobility       PT Treatment Interventions DME instruction;Gait training;Stair training;Functional mobility training;Therapeutic activities;Therapeutic exercise;Balance training;Patient/family education    PT Goals (Current goals can be found in the Care Plan section)  Acute Rehab PT Goals Patient Stated Goal: Return home asap PT Goal Formulation: With patient Time For Goal Achievement: 12/22/17 Potential to Achieve Goals: Good    Frequency Min 3X/week   Barriers to discharge        Co-evaluation               AM-PAC PT "6 Clicks" Daily Activity  Outcome Measure Difficulty turning over in bed (including adjusting bedclothes, sheets and blankets)?: None Difficulty moving from lying on back to sitting on the side of the bed? : None Difficulty sitting down on and standing up from a chair with arms (e.g., wheelchair, bedside commode, etc,.)?: A Little Help needed moving to and from a bed to chair (including a wheelchair)?: A Little Help needed walking in hospital room?: A Little Help needed climbing 3-5 steps with a railing? : A Little 6 Click Score: 20    End of Session Equipment Utilized During Treatment: Gait belt;Oxygen Activity Tolerance: Patient tolerated treatment well Patient left: in bed;with call bell/phone within reach;with bed alarm set;with family/visitor present Nurse Communication: Mobility status PT Visit Diagnosis: Other abnormalities of gait and mobility (R26.89)    Time: 8546-2703 PT Time Calculation (min) (ACUTE ONLY): 37 min   Charges:   PT Evaluation $PT Eval Moderate Complexity: 1 Mod PT Treatments $Gait Training: 8-22 mins   PT G Codes:       Mabeline Caras, PT, DPT Acute Rehab Services  Pager: [place] 12/08/2017, 5:07 PM

## 2017-12-08 NOTE — Progress Notes (Signed)
[facility] Consultation Progress Note  Patient: Victor [name] VZC:[id number]   PCP: Cyndi Bender, PA-C DOB: 12/21/1935   DOA: 12/03/2017   DOS: 12/08/2017   Date of Service: the patient was seen and examined on 12/08/2017 Primary service: [place], Peter M, MD   Subjective: No acute events overnight.  No acute complaints.  No nausea no vomiting.  Oral intake stable.  Foley catheter removed, no urine output yet.  Brief hospital course: Victor [name] is a 82 y.o. male with Past medical history of CAD S/P CABG, ischemic cardiomyopathy with chronic combined CHF, chronic kidney disease stage III, arthritis, HTN, chronic respiratory failure on 2 L of oxygen, CVA, PVD, type II DM on chronic insulin. Patient is coming from [facility]. Patient presented with complaints of chest pain prior to be musculoskeletal without any benefit from nitroglycerin.  CT PE in the other facility was negative for dissection or PE.  Patient was transferred to [facility] for further workup for his chest pain as well as the finding of fluid surrounding epicardial pacer. During the stay in the hospital patient continues to have chest pain as well as diffuse abdominal pain.  Interventional radiology as well as cardio thoracic surgery was consulted for fluid collection. Patient underwent ultrasound-guided fluid aspiration on 12/05/2017 30 mL fluid brown thick was removed.  Still large amount of fluid remains in the pocket which will require surgical removal. Patient's Cymbalta was increased from 30 mg daily to 60 mg daily.  50 mg 3 times daily as needed, last dose was on 9.11 on 12/06/2017.  Zanaflex was also started last dose was on 11.22 on 12/06/2017. On 12/06/2017 after receiving Zanaflex and tramadol the patient become more lethargic and sleepy and drowsy.  Later during the day the patient was not able to stay awake.  Rapid response was called for further assistance.  Hospitalist was called for further management  of acute encephalopathy. At the time of my evaluation rapid response nurse was at bedside, patient was drowsy and lethargic but arousable on verbal command.  Nonverbal following commands and falling asleep during the conversation.  Blood glucose was 138, blood pressure was 76/42 without any tachycardia. Transferred to ICU on 12/06/2017, started on levo fed and IV fluid with improvement in mentation as well as blood pressure.  Currently further plan is continue supportive care.  Assessment and Plan: 1.  Acute metabolic encephalopathy. toxic in the setting of polypharmacy. Patient received increased dose of Cymbalta, Lyrica, new medication tramadol as well as Zanaflex. Remained lethargic throughout the day on 12/06/2017.  Significant improvement on 12/07/2017,  Asterixis is currently resolved since 12/08/2017. ABG shows no hypercarbia mild hypoxia. Patient received IV Narcan without any significant benefit, also received 1 dose of IV glucagon for beta-blocker reversal. Continue to avoid psychotropic medications.  2.  Acute on chronic systolic CHF. Patient was diuresed with 2.7 L negative so far during the stay based on ins and out. Received 2.7 L IV fluid on 12/06/2017, still negative for hospital stay. Cardiology give the patient IV Lasix.  Monitor renal function.  3.  acute kidney injury on chronic kidney disease stage III. Renal function on admission was showing GFR 55, serum creatinine 1.21.  On 12/04/2017 it was 1.51. Creatinine almost back to baseline. Adequate urine output. Likely prerenal etiology with over diuresis.  There is a potential for it to further worsen due to hypotensive episode yesterday with possible ATN. Continue to Avoid nephrotoxic medications. Lasix and Cozaar has been on  hold since 12/06/2017 Received gentle hydration until 12/08/2017.  Received Lasix starting again on 12/08/2017.  Renal function with reintroduction of diuresis.  4.  Leukocytosis. Epicardial system  pocket wall hematoma. Rectus sheath hematoma. Suspected epicardial pocket infection. Chest wall pain Hb appears relatively stable. Will monitor. Ultrasound-guided aspiration was performed which shows no organisms so far. Blood cultures are also negative. Infectious disease consulted recommend no antibiotic right now. Cardio thoracic surgery was consulted, patient may require surgical removal of all the fluid. With rectal temp of 102 patient was empirically started on IV antibiotics with suspicion for sepsis. Significant improvement overnight with supportive measures, less likely sepsis.  Discontinue antibiotics.  Blood culture negative till now. Pain control with k pad and lidocaine patch. Avoid psychotropic meds, no more asterixis.   5.  Type 2 diabetes mellitus. Uncontrolled with hyperglycemia Patient's home dose 30 units of Lantus. Dose was reduced to Lantus to 15 units with sensitive sliding scale every 4 hours. Now that the patient is more awake, will advance the sliding scale. Given the prior hyperglycemia, changing to resistant sliding scale. Going back on home 30 units of Levemir. Monitor CBG before meals at bedtime.  Daily BMP.  5. ST elevation on EKG H/O CAD Management per cardiology,.  EKG changes not consistent with STEMI  6.  Goals of care discussion. Patient wanted to change his CODE STATUS from full code to DNR/DNI. Patient's daughter was at bedside as well as son was aware of this decision per family. Patient was also given a copy of MOST form to review.  Bowel regimen: last BM 12/06/2016 Diet: advance ad tolerated to cardiac and carb modified diet DVT Prophylaxis: subcutaneous Heparin  Family Communication: family was present at bedside, at the time of interview.   Disposition: We will continue to follow the patient.   Recommendation on discharge: Pending further improvement  Other Consultants: ID, CCM Procedures:  Antibiotics: Anti-infectives (From  admission, onward)   Start     Dose/Rate Route Frequency Ordered Stop   12/07/17 1830  vancomycin (VANCOCIN) IVPB 1000 mg/200 mL premix  Status:  Discontinued     1,000 mg 200 mL/hr over 60 Minutes Intravenous Every 24 hours 12/06/17 1843 12/07/17 0917   12/07/17 0400  piperacillin-tazobactam (ZOSYN) IVPB 3.375 g  Status:  Discontinued     3.375 g 12.5 mL/hr over 240 Minutes Intravenous Every 8 hours 12/06/17 1843 12/07/17 0917   12/06/17 1900  piperacillin-tazobactam (ZOSYN) IVPB 3.375 g     3.375 g 100 mL/hr over 30 Minutes Intravenous  Once 12/06/17 1839 12/06/17 2030   12/06/17 1900  vancomycin (VANCOCIN) IVPB 1000 mg/200 mL premix     1,000 mg 200 mL/hr over 60 Minutes Intravenous  Once 12/06/17 1839 12/06/17 2140      Objective: Physical Exam: Vitals:   12/08/17 1400 12/08/17 1500 12/08/17 1512 12/08/17 1608  BP:   127/83   Pulse: 97 95 (!) 104   Resp: 14 20 (!) 21   Temp:    97.9 F (36.6 C)  TempSrc:    Oral  SpO2: 100% 100% 90%   Weight:      Height:        Intake/Output Summary (Last 24 hours) at 12/08/2017 1617 Last data filed at 12/08/2017 1252 Gross per 24 hour  Intake 1206.75 ml  Output 2300 ml  Net -1093.25 ml   Filed Weights   12/04/17 0217 12/05/17 0546 12/06/17 0357  Weight: 71.9 kg (158 lb 8.2 oz) 71.3 kg (157 lb 3  oz) 69.2 kg (152 lb 8 oz)   General: Alert, Awake and Oriented to Time, Place and Person. Appear in moderate distress, affect appropriate, joking with staff, although does not remember events of yesterday. Eyes: PERRL, Conjunctiva normal ENT: Oral Mucosa clear dry. Neck: difficult to assess JVD, no Abnormal Mass Or lumps Cardiovascular: S1 and S2 Present, no Murmur, Peripheral Pulses Present Respiratory: normal respiratory effort, Bilateral Air entry equal and Decreased, no use of accessory muscle, Clear to Auscultation, no Crackles, no wheezes Abdomen: Bowel Sound present, Soft and no tenderness, no hernia Skin: no redness, no Rash, no  induration Extremities: no Pedal edema, no calf tenderness Neurologic: Grossly no focal neuro deficit. Bilaterally Equal motor strength Asterixis resolved  Data Reviewed: CBC: Recent Labs  Lab 12/03/17 0902 12/04/17 0637 12/06/17 1806 12/07/17 0237 12/08/17 0407  WBC 24.1* 25.2* 15.0* 17.4* 12.1*  NEUTROABS  --   --  12.6* 13.7*  --   HGB 12.7* 11.2* 10.3* 11.1* 10.2*  HCT 37.7* 35.0* 30.5* 34.3* 31.0*  MCV 83.0 83.9 83.1 84.1 82.7  PLT 164 129* 158 170 989*   Basic Metabolic Panel: Recent Labs  Lab 12/03/17 0902 12/04/17 0637 12/06/17 1806 12/07/17 0237 12/08/17 0407  NA 133* 131* 135 137 133*  K 4.0 3.8 3.6 3.9 3.7  CL 93* 93* 96* 101 97*  CO2 27 25 26 22 24   GLUCOSE 288* 281* 100* 114* 309*  BUN 29* 33* 52* 57* 43*  CREATININE 1.21 1.51* 2.62* 2.51* 1.62*  CALCIUM 9.1 9.0 8.3* 8.3* 8.2*  MG  --   --   --  2.1  --    Liver Function Tests: Recent Labs  Lab 12/03/17 0902 12/03/17 1538 12/06/17 1806  AST 19 15 50*  ALT 16* 13* 33  ALKPHOS 79 69 99  BILITOT 0.8 0.9 0.6  PROT 6.6 6.1* 5.3*  ALBUMIN 3.6 3.1* 2.3*   Recent Labs  Lab 12/03/17 1538  LIPASE 23  AMYLASE 27*   Recent Labs  Lab 12/06/17 1806  AMMONIA 13   Cardiac Enzymes: Recent Labs  Lab 12/06/17 1806 12/06/17 2334 12/07/17 0237 12/07/17 1106 12/07/17 1828  CKTOTAL 20*  --   --   --   --   CKMB 1.0  --   --   --   --   TROPONINI 0.13* 0.10* 0.39* 0.08* 0.07*   BNP (last 3 results) Recent Labs    07/16/17 2041 09/05/17 1434 11/08/17 0928  BNP 1,436.0* 1,195.5* 796.6*   CBG: Recent Labs  Lab 12/07/17 1157 12/07/17 1649 12/07/17 2103 12/08/17 0754 12/08/17 1143  GLUCAP 196* 304* 347* 361* 303*   Recent Results (from the past 240 hour(s))  Culture, blood (Routine X 2) w Reflex to ID Panel     Status: None (Preliminary result)   Collection Time: 12/04/17 12:40 PM  Result Value Ref Range Status   Specimen Description BLOOD RIGHT ANTECUBITAL  Final   Special Requests IN  PEDIATRIC BOTTLE Blood Culture adequate volume  Final   Culture NO GROWTH 4 DAYS  Final   Report Status PENDING  Incomplete  Culture, blood (Routine X 2) w Reflex to ID Panel     Status: None (Preliminary result)   Collection Time: 12/04/17 12:44 PM  Result Value Ref Range Status   Specimen Description BLOOD RIGHT HAND  Final   Special Requests IN PEDIATRIC BOTTLE Blood Culture adequate volume  Final   Culture NO GROWTH 4 DAYS  Final   Report Status PENDING  Incomplete  Body fluid culture     Status: None (Preliminary result)   Collection Time: 12/05/17  3:35 PM  Result Value Ref Range Status   Specimen Description FLUID ABDOMEN  Final   Special Requests Normal  Final   Gram Stain NO WBC SEEN NO ORGANISMS SEEN   Final   Culture NO GROWTH 3 DAYS  Final   Report Status PENDING  Incomplete  Culture, blood (x 2)     Status: None (Preliminary result)   Collection Time: 12/06/17  8:21 PM  Result Value Ref Range Status   Specimen Description BLOOD HAND LEFT  Final   Special Requests IN PEDIATRIC BOTTLE Blood Culture adequate volume  Final   Culture NO GROWTH 2 DAYS  Final   Report Status PENDING  Incomplete  Culture, blood (x 2)     Status: None (Preliminary result)   Collection Time: 12/06/17  8:22 PM  Result Value Ref Range Status   Specimen Description BLOOD ARM RIGHT  Final   Special Requests IN PEDIATRIC BOTTLE Blood Culture adequate volume  Final   Culture NO GROWTH 2 DAYS  Final   Report Status PENDING  Incomplete  MRSA PCR Screening     Status: None   Collection Time: 12/06/17  8:50 PM  Result Value Ref Range Status   MRSA by PCR NEGATIVE NEGATIVE Final    Comment:        The GeneXpert MRSA Assay (FDA approved for NASAL specimens only), is one component of a comprehensive MRSA colonization surveillance program. It is not intended to diagnose MRSA infection nor to guide or monitor treatment for MRSA infections.   Culture, Urine     Status: None   Collection Time:  12/06/17  9:43 PM  Result Value Ref Range Status   Specimen Description URINE, RANDOM  Final   Special Requests NONE  Final   Culture NO GROWTH  Final   Report Status 12/08/2017 FINAL  Final    Studies: No results found.   Scheduled Meds: . apixaban  2.5 mg Oral BID  . clopidogrel  75 mg Oral Daily  . insulin aspart  0-20 Units Subcutaneous TID WC  . insulin aspart  0-5 Units Subcutaneous QHS  . insulin detemir  30 Units Subcutaneous Daily  . lidocaine  2 patch Transdermal Q24H  . metoprolol tartrate  12.5 mg Oral QID  . pravastatin  40 mg Oral q1800    Continuous Infusions:  PRN Meds: acetaminophen, HYDROmorphone (DILAUDID) injection, nitroGLYCERIN, ondansetron (ZOFRAN) IV, traZODone  Time spent: 35 minutes  Author: Berle Mull, MD [facility] Pager: [phone] 12/08/2017 4:17 PM  If 7PM-7AM, please contact night-coverage at [email or web address], password [facility]

## 2017-12-08 NOTE — Care Management Note (Signed)
Case Management Note  Patient Details  Name: Victor [name] MRN: [id number] Date of Birth: [birth year]  Subjective/Objective:    From home with grandson, patient has two or three canes at home and three walkers per patien. Daughter , Tammy at bedside.  He has PCP and medication coverage.  He is here  with  Afib, hematoma  evacuation, cad, chf, will be on eliquis,  NCM gave patient the 30 day free card for eliquis.  Awaiting benefit check.  NCM will call daughter Lynelle Smoke to give her this information when received at [phone].                Action/Plan: NCM will follow for dc needs.   Expected Discharge Date:  12/05/17               Expected Discharge Plan:  Home/Self Care  In-House Referral:     Discharge planning Services  CM Consult, Medication Assistance  Post Acute Care Choice:    Choice offered to:     DME Arranged:    DME Agency:     HH Arranged:    HH Agency:     Status of Service:  In process, will continue to follow  If discussed at Long Length of Stay Meetings, dates discussed:    Additional Comments:  Zenon Mayo, RN 12/08/2017, 3:08 PM

## 2017-12-08 NOTE — Care Management Important Message (Signed)
Important Message  Patient Details  Name: Victor [name] MRN: [id number] Date of Birth: September 28, 1936   Medicare Important Message Given:  Yes    Kare Dado 12/08/2017, 2:16 PM

## 2017-12-08 NOTE — Progress Notes (Signed)
ANTICOAGULATION CONSULT NOTE - Follow Up Consult  Pharmacy Consult for Heparin  Indication: atrial fibrillation   Patient Measurements: Height: 5\' 8"  (172.7 cm) Weight: 152 lb 8 oz (69.2 kg) IBW/kg (Calculated) : 68.4  Vital Signs: Temp: 97.6 F (36.4 C) (01/18 0300) Temp Source: Oral (01/18 0300) BP: 126/67 (01/18 0500) Pulse Rate: 104 (01/18 0500)  Labs: Recent Labs    12/05/17 1043  12/06/17 1806 12/06/17 1920  12/07/17 0237 12/07/17 1106 12/07/17 1828 12/08/17 0407  HGB  --    < > 10.3*  --   --  11.1*  --   --  10.2*  HCT  --   --  30.5*  --   --  34.3*  --   --  31.0*  PLT  --   --  158  --   --  170  --   --  145*  LABPROT 15.2  --   --  15.7*  --   --   --   --   --   INR 1.21  --   --  1.26  --   --   --   --   --   HEPARINUNFRC  --   --   --   --   --   --   --   --  <0.10*  CREATININE  --   --  2.62*  --   --  2.51*  --   --  1.62*  CKTOTAL  --   --  20*  --   --   --   --   --   --   CKMB  --   --  1.0  --   --   --   --   --   --   TROPONINI  --   --  0.13*  --    < > 0.39* 0.08* 0.07*  --    < > = values in this interval not displayed.    Estimated Creatinine Clearance: 35.8 mL/min (A) (by C-G formula based on SCr of 1.62 mg/dL (H)).  Assessment: 82 y/o M on heparin for atrial fibrillation. Hgb 10.2. Initial heparin level this AM is undetectable. No issues per RN.   Goal of Therapy:  Heparin level 0.3-0.7 units/ml Monitor platelets by anticoagulation protocol: Yes   Plan:  Heparin 2000 units [employer] heparin drip to 1100 units/hr 1400 HL  [place] Latulippe 12/08/2017,5:39 AM

## 2017-12-08 NOTE — Progress Notes (Signed)
Progress Note  Patient Name: Victor [name] Date of Encounter: 12/08/2017  Primary Cardiologist: Peter Martinique, MD   Subjective   Abdomen a little tight  Chest still hurts  L side    Didn't sleep well   Dilaudid helped with pain    Inpatient Medications    Scheduled Meds: . aspirin EC  81 mg Oral QHS  . clopidogrel  75 mg Oral Daily  . insulin aspart  0-15 Units Subcutaneous TID WC  . insulin aspart  0-5 Units Subcutaneous QHS  . insulin detemir  10 Units Subcutaneous Daily  . lidocaine  2 patch Transdermal Q24H  . pravastatin  40 mg Oral q1800   Continuous Infusions: . heparin 1,100 Units/hr (12/08/17 0621)   PRN Meds: acetaminophen, nitroGLYCERIN, ondansetron (ZOFRAN) IV, traZODone   Vital Signs    Vitals:   12/08/17 0600 12/08/17 0700 12/08/17 0742 12/08/17 0800  BP: (!) 148/71 131/68  140/75  Pulse: (!) 107 93  (!) 106  Resp: (!) 30 (!) 31  (!) 25  Temp:   98.6 F (37 C)   TempSrc:   Oral   SpO2: 100% 100%  98%  Weight:      Height:        Intake/Output Summary (Last 24 hours) at 12/08/2017 0815 Last data filed at 12/08/2017 0800 Gross per 24 hour  Intake 1566.75 ml  Output 2300 ml  Net -733.25 ml   Filed Weights   12/04/17 0217 12/05/17 0546 12/06/17 0357  Weight: 158 lb 8.2 oz (71.9 kg) 157 lb 3 oz (71.3 kg) 152 lb 8 oz (69.2 kg)    Telemetry    Afib   100s    ECG      Physical Exam   GEN: No acute distress.   Neck: JVP increased Cardiac: Irreg irreg , no murmurs, rubs, or gallops.  Chest  Tender on L lateral wall   Respiratory: Clear to auscultation bilaterally.anteriorly   Chest  Tender   GI: Soft, mild diffuse tendernes (very mild), non-distended  MS: No edema; No deformity. Neuro:  Nonfocal  Psych: Normal affect   Labs    Chemistry Recent Labs  Lab 12/03/17 0902 12/03/17 1538  12/06/17 1806 12/07/17 0237 12/08/17 0407  NA 133*  --    < > 135 137 133*  K 4.0  --    < > 3.6 3.9 3.7  CL 93*  --    < > 96* 101 97*  CO2  27  --    < > 26 22 24   GLUCOSE 288*  --    < > 100* 114* 309*  BUN 29*  --    < > 52* 57* 43*  CREATININE 1.21  --    < > 2.62* 2.51* 1.62*  CALCIUM 9.1  --    < > 8.3* 8.3* 8.2*  PROT 6.6 6.1*  --  5.3*  --   --   ALBUMIN 3.6 3.1*  --  2.3*  --   --   AST 19 15  --  50*  --   --   ALT 16* 13*  --  33  --   --   ALKPHOS 79 69  --  99  --   --   BILITOT 0.8 0.9  --  0.6  --   --   GFRNONAA 55*  --    < > 22* 23* 39*  GFRAA >60  --    < > 25* 26* 45*  ANIONGAP 13  --    < > 13 14 12    < > = values in this interval not displayed.     Hematology Recent Labs  Lab 12/06/17 1806 12/07/17 0237 12/08/17 0407  WBC 15.0* 17.4* 12.1*  RBC 3.67* 4.08* 3.75*  HGB 10.3* 11.1* 10.2*  HCT 30.5* 34.3* 31.0*  MCV 83.1 84.1 82.7  MCH 28.1 27.2 27.2  MCHC 33.8 32.4 32.9  RDW 14.5 14.8 14.5  PLT 158 170 145*    Cardiac Enzymes Recent Labs  Lab 12/06/17 2334 12/07/17 0237 12/07/17 1106 12/07/17 1828  TROPONINI 0.10* 0.39* 0.08* 0.07*   No results for input(s): TROPIPOC in the last 168 hours.   BNPNo results for input(s): BNP, PROBNP in the last 168 hours.   DDimer No results for input(s): DDIMER in the last 168 hours.   Radiology    Dg Chest Port 1 View  Result Date: 12/06/2017 CLINICAL DATA:  Shortness of breath EXAM: PORTABLE CHEST 1 VIEW COMPARISON:  12/03/2017 FINDINGS: Bilateral diffuse mild interstitial thickening. No pleural effusion or pneumothorax. No focal consolidation. Stable cardiomegaly. Prior CABG. Single lead cardiac pacemaker. No acute osseous abnormality. IMPRESSION: Cardiomegaly with pulmonary vascular congestion. Electronically Signed   By: Kathreen Devoid   On: 12/06/2017 20:18    Cardiac Studies     Patient Profile     82 y.o. male past medical history of CAD, s/p CABG, chronic combined systolic/diastolic congestive heart failure, ischemic cardiomyopathy-last EF 35-40%, history of VT status post ICD, hypertension, diabetes mellitus, chronic stage III kidney  disease, and PVD admitted with continuous chest pain and abdominal pain. CT scan at an outside hospital showed fluid collection around ICD wires. Troponin minimally elevated.    Assessment & Plan    1  Neuro  Back to baseline   Follow esp with meds for pain   Tolerated dilaudid  2  Chest  Does not appear cardiac  Point tenderness  Brings on pain  Dog had jumped on him.  He does have diffuse severe dz (not revascularizable)  Triv bump in trop may relfect effect of hypotension the previous day  Chest wall tender  Unfort options for Rx limited  (confusion day prior, on anticoag) Tolerated dilaudid  WIll go ahead and use    3  Atrial fib  Will add short acting lopressor for now  FOllow and switch to long acting once HR /BP analyzed  Does not tolerate coreg  I worry bisoprolol may bring BP down too much   WIll place on protonix prophylactically with anticoag   4  Hematoma  Evacuated  Cultures negative  ID has followed  Does not think infected   Got one or 2 doses of abx 2 days ago when MS changes   Off  WBC still coming down.  5  CAD  Diffuse  Severe     6  Acute on chronic systolic CHF   Volume is up mildly  Give one dose lasix   Follow  7  Renal  Cr has gone down to 1.6  Again, bump may be related to hypotension 2 days ago   8  HL  ON statin    l  For questions or updates, please contact [facility] Please consult [email or web address] for contact info under Cardiology/STEMI.      Signed, Dorris Carnes, MD  12/08/2017, 8:15 AM

## 2017-12-08 NOTE — Progress Notes (Signed)
Hypoglycemic Event  CBG: 47  Treatment: 15 GM carbohydrate snack  Symptoms: None  Follow-up CBG: Time:0930 CBG Result:62  Possible Reasons for Event: Inadequate meal intake  Comments/MD notified:Cards MD on call made aware. Holding Levemir until further notice. Gave the patient some more juice to drink to help raise blood sugar some more. Will continue to monitor patient.     [place]

## 2017-12-08 NOTE — Significant Event (Signed)
Notified primary team, Dr. Harrington Challenger, that patient wishes to become a DNR. Patient is alert and oriented X 4, family at bedside. Daughter Lynelle Smoke Alroy Dust) is in agreement with patient. Patient's grandson (Josh Stanfield, [place]) is in agreement with patient.    Dr. Posey Pronto came to bedside this afternoon to round and made aware of patient's wishes. Dr. Posey Pronto discuss patient's wishes with patient and his daughter Lynelle Smoke. Dr. Posey Pronto plans to make fill out paperwork (yellow DNR and MOST forms) tomorrow upon rounding. However, patient and family want their wishes documented DNR.

## 2017-12-09 ENCOUNTER — Inpatient Hospital Stay (HOSPITAL_COMMUNITY): Payer: Medicare Other

## 2017-12-09 DIAGNOSIS — I5023 Acute on chronic systolic (congestive) heart failure: Secondary | ICD-10-CM

## 2017-12-09 DIAGNOSIS — N179 Acute kidney failure, unspecified: Secondary | ICD-10-CM

## 2017-12-09 LAB — GLUCOSE, CAPILLARY
GLUCOSE-CAPILLARY: 277 mg/dL — AB (ref 65–99)
GLUCOSE-CAPILLARY: 498 mg/dL — AB (ref 65–99)
GLUCOSE-CAPILLARY: 122 mg/dL — AB (ref 65–99)
Glucose-Capillary: 181 mg/dL — ABNORMAL HIGH (ref 65–99)
Glucose-Capillary: 199 mg/dL — ABNORMAL HIGH (ref 65–99)
Glucose-Capillary: 418 mg/dL — ABNORMAL HIGH (ref 65–99)

## 2017-12-09 LAB — MAGNESIUM: MAGNESIUM: 1.8 mg/dL (ref 1.7–2.4)

## 2017-12-09 LAB — BASIC METABOLIC PANEL
ANION GAP: 13 (ref 5–15)
BUN: 21 mg/dL — ABNORMAL HIGH (ref 6–20)
CALCIUM: 8.1 mg/dL — AB (ref 8.9–10.3)
CO2: 24 mmol/L (ref 22–32)
Chloride: 93 mmol/L — ABNORMAL LOW (ref 101–111)
Creatinine, Ser: 1.29 mg/dL — ABNORMAL HIGH (ref 0.61–1.24)
GFR calc non Af Amer: 51 mL/min — ABNORMAL LOW (ref >60)
GFR, EST AFRICAN AMERICAN: 59 mL/min — AB (ref >60)
Glucose, Bld: 485 mg/dL — ABNORMAL HIGH (ref 65–99)
Potassium: 4 mmol/L (ref 3.5–5.1)
Sodium: 130 mmol/L — ABNORMAL LOW (ref 135–145)

## 2017-12-09 LAB — CULTURE, BLOOD (ROUTINE X 2)
CULTURE: NO GROWTH
Culture: NO GROWTH
SPECIAL REQUESTS: ADEQUATE
Special Requests: ADEQUATE

## 2017-12-09 LAB — BODY FLUID CULTURE
Culture: NO GROWTH
Gram Stain: NONE SEEN
Special Requests: NORMAL

## 2017-12-09 MED ORDER — INSULIN DETEMIR 100 UNIT/ML ~~LOC~~ SOLN
20.0000 [IU] | Freq: Every day | SUBCUTANEOUS | Status: DC
  Administered 2017-12-09 – 2017-12-10 (×2): 20 [IU] via SUBCUTANEOUS
  Filled 2017-12-09 (×2): qty 0.2

## 2017-12-09 MED ORDER — INSULIN ASPART 100 UNIT/ML ~~LOC~~ SOLN: 0.0000 [IU] | Freq: Every day | SUBCUTANEOUS | Status: DC

## 2017-12-09 MED ORDER — INSULIN DETEMIR 100 UNIT/ML ~~LOC~~ SOLN
20.0000 [IU] | Freq: Every day | SUBCUTANEOUS | Status: DC
  Filled 2017-12-09: qty 0.2

## 2017-12-09 MED ORDER — HALOPERIDOL LACTATE 5 MG/ML IJ SOLN
2.5000 mg | Freq: Once | INTRAMUSCULAR | Status: AC
  Administered 2017-12-09: 2.5 mg via INTRAVENOUS

## 2017-12-09 MED ORDER — AMIODARONE HCL IN DEXTROSE 360-4.14 MG/200ML-% IV SOLN
30.0000 mg/h | INTRAVENOUS | Status: DC
  Administered 2017-12-09: 30 mg/h via INTRAVENOUS
  Filled 2017-12-09 (×2): qty 200

## 2017-12-09 MED ORDER — FUROSEMIDE 80 MG PO TABS
80.0000 mg | ORAL_TABLET | Freq: Every day | ORAL | Status: DC
  Administered 2017-12-10 – 2017-12-12 (×2): 80 mg via ORAL
  Filled 2017-12-09 (×3): qty 1

## 2017-12-09 MED ORDER — INSULIN ASPART 100 UNIT/ML ~~LOC~~ SOLN
18.0000 [IU] | Freq: Once | SUBCUTANEOUS | Status: AC
  Administered 2017-12-09: 18 [IU] via SUBCUTANEOUS

## 2017-12-09 MED ORDER — AMIODARONE HCL IN DEXTROSE 360-4.14 MG/200ML-% IV SOLN
60.0000 mg/h | INTRAVENOUS | Status: AC
  Administered 2017-12-09 (×2): 60 mg/h via INTRAVENOUS
  Filled 2017-12-09 (×2): qty 200

## 2017-12-09 MED ORDER — HEPARIN (PORCINE) IN NACL 100-0.45 UNIT/ML-% IJ SOLN
1100.0000 [IU]/h | INTRAMUSCULAR | Status: DC
  Administered 2017-12-09: 1100 [IU]/h via INTRAVENOUS
  Filled 2017-12-09: qty 250

## 2017-12-09 MED ORDER — INSULIN ASPART 100 UNIT/ML ~~LOC~~ SOLN
0.0000 [IU] | Freq: Three times a day (TID) | SUBCUTANEOUS | Status: DC
  Administered 2017-12-09: 2 [IU] via SUBCUTANEOUS
  Administered 2017-12-09: 5 [IU] via SUBCUTANEOUS
  Administered 2017-12-10 (×2): 3 [IU] via SUBCUTANEOUS

## 2017-12-09 MED ORDER — AMIODARONE LOAD VIA INFUSION
150.0000 mg | Freq: Once | INTRAVENOUS | Status: AC
  Administered 2017-12-09: 150 mg via INTRAVENOUS
  Filled 2017-12-09: qty 83.34

## 2017-12-09 MED ORDER — INSULIN ASPART 100 UNIT/ML ~~LOC~~ SOLN
3.0000 [IU] | Freq: Three times a day (TID) | SUBCUTANEOUS | Status: DC
  Administered 2017-12-09 – 2017-12-10 (×4): 3 [IU] via SUBCUTANEOUS

## 2017-12-09 NOTE — Progress Notes (Signed)
[facility] Consultation Progress Note  Patient: Victor [name] UTM:[id number]   PCP: Cyndi Bender, PA-C DOB: 1935/12/18   DOA: 12/03/2017   DOS: 12/09/2017   Date of Service: the patient was seen and examined on 12/09/2017 Primary service: Victor [name], Victor M, MD   Subjective: Remains tachycardic overnight.  Has intermittent shortness of breath as well as continuous chest pain as well as abdominal wall pain.  No nausea no vomiting.  No fever no chills.  Mentation remains stable.  Brief hospital course: Victor [name] is a 82 y.o. male with Past medical history of CAD S/P CABG, ischemic cardiomyopathy with chronic combined CHF, chronic kidney disease stage III, arthritis, HTN, chronic respiratory failure on 2 L of oxygen, CVA, PVD, type II DM on chronic insulin. Patient is coming from [facility]. Patient presented with complaints of chest pain prior to be musculoskeletal without any benefit from nitroglycerin.  CT PE in the other facility was negative for dissection or PE.  Patient was transferred to [facility] for further workup for his chest pain as well as the finding of fluid surrounding epicardial pacer. During the stay in the hospital patient continues to have chest pain as well as diffuse abdominal pain.  Interventional radiology as well as cardio thoracic surgery was consulted for fluid collection. Patient underwent ultrasound-guided fluid aspiration on 12/05/2017 30 mL fluid brown thick was removed.  Still large amount of fluid remains in the pocket which will require surgical removal. Patient's Cymbalta was increased from 30 mg daily to 60 mg daily.  50 mg 3 times daily as needed, last dose was on 9.11 on 12/06/2017.  Zanaflex was also started last dose was on 11.22 on 12/06/2017. On 12/06/2017 after receiving Zanaflex and tramadol the patient become more lethargic and sleepy and drowsy.  Later during the day the patient was not able to stay awake.  Rapid response was called for  further assistance.  Hospitalist was called for further management of acute encephalopathy. At the time of my evaluation rapid response nurse was at bedside, patient was drowsy and lethargic but arousable on verbal command.  Nonverbal following commands and falling asleep during the conversation.  Blood glucose was 138, blood pressure was 76/42 without any tachycardia. Transferred to ICU on 12/06/2017, started on levo fed and IV fluid with improvement in mentation as well as blood pressure.  Currently further plan is continue supportive care, DC cardioversion as well as removal of the pocket wall hematoma on Monday.  Assessment and Plan: 1.  Acute metabolic encephalopathy. Now resolved toxic in the setting of polypharmacy. Patient received increased dose of Cymbalta, Lyrica, new medication tramadol as well as Zanaflex. Remained lethargic throughout the day on 12/06/2017.  Significant improvement on 12/07/2017,  Asterixis is currently resolved since 12/08/2017. ABG shows no hypercarbia mild hypoxia. Patient received IV Narcan without any significant benefit, also received 1 dose of IV glucagon for beta-blocker reversal. Continue to avoid psychotropic medications.  2. acute kidney injury on chronic kidney disease stage III. Renal function on admission was showing GFR 55, serum creatinine 1.21.  On 12/04/2017 it was 1.51. Creatinine almost back to baseline. Adequate urine output. Likely prerenal etiology with over diuresis.  There is a potential for it to further worsen due to hypotensive episode yesterday with possible ATN. Continue to Avoid nephrotoxic medications. Lasix and Cozaar has been on hold since 12/06/2017 Received gentle hydration until 12/08/2017.  Received Lasix starting again on 12/08/2017.  Monitor Renal function with reintroduction of diuresis.  3.  Type 2 diabetes mellitus. Uncontrolled with hyperglycemia and hypoglycemia Patient's home dose 30 units of Lantus. Dose was reduced  to Lantus to 15 units with sensitive sliding scale every 4 hours. Now that the patient is more awake, will advance the sliding scale. Skipped his dinner therefore become hypoglycemic in night Currently on 20 units of long-acting insulin, sensitive sliding scale as well as 3 units of meal coverage.  4.  Leukocytosis. Epicardial system pocket wall hematoma. Rectus sheath hematoma. Suspected epicardial pocket infection. Chest wall pain Hb appears relatively stable. Will monitor. Ultrasound-guided aspiration was performed which shows no organisms so far. Blood cultures are also negative. Infectious disease consulted recommend no antibiotic right now. Cardio thoracic surgery was consulted, patient may require surgical removal of all the fluid. With rectal temp of 102 patient was empirically started on IV antibiotics with suspicion for sepsis. Significant improvement overnight with supportive measures, less likely sepsis.  Discontinue antibiotics.  Blood culture negative till now. Pain control with k pad and lidocaine patch. Avoid psychotropic meds, no more asterixis.   5.  Acute on chronic systolic CHF. Patient was diuresed with 2.7 L negative so far during the stay based on ins and out. Received 2.7 L IV fluid on 12/06/2017, still negative for hospital stay. Cardiology give the patient IV Lasix.  Monitor renal function. Management per cardiology   5. ST elevation on EKG H/O CAD Management per cardiology,.  EKG changes not consistent with STEMI  6.  Goals of care discussion. Patient wanted to change his CODE STATUS from full code to DNR/DNI. Patient's daughter was at bedside as well as son was aware of this decision per family. Patient was also given a copy of MOST form to review.  7.  A. fib with RVR. Management per cardiology. Plan is to perform cardioversion on Monday. On heparin for anticoagulation  Bowel regimen: last BM 12/06/2016 Diet: advance ad tolerated to cardiac and  carb modified diet DVT Prophylaxis: subcutaneous Heparin  Family Communication: family was present at bedside, at the time of interview.   Disposition: We will continue to follow the patient.   Recommendation on discharge: Pending further improvement  Other Consultants: ID, CCM Procedures:  Antibiotics: Anti-infectives (From admission, onward)   Start     Dose/Rate Route Frequency Ordered Stop   12/07/17 1830  vancomycin (VANCOCIN) IVPB 1000 mg/200 mL premix  Status:  Discontinued     1,000 mg 200 mL/hr over 60 Minutes Intravenous Every 24 hours 12/06/17 1843 12/07/17 0917   12/07/17 0400  piperacillin-tazobactam (ZOSYN) IVPB 3.375 g  Status:  Discontinued     3.375 g 12.5 mL/hr over 240 Minutes Intravenous Every 8 hours 12/06/17 1843 12/07/17 0917   12/06/17 1900  piperacillin-tazobactam (ZOSYN) IVPB 3.375 g     3.375 g 100 mL/hr over 30 Minutes Intravenous  Once 12/06/17 1839 12/06/17 2030   12/06/17 1900  vancomycin (VANCOCIN) IVPB 1000 mg/200 mL premix     1,000 mg 200 mL/hr over 60 Minutes Intravenous  Once 12/06/17 1839 12/06/17 2140      Objective: Physical Exam: Vitals:   12/09/17 0900 12/09/17 1000 12/09/17 1100 12/09/17 1519  BP: 136/73 129/78 (!) 149/67 133/66  Pulse: (!) 115 (!) 134 (!) 139 (!) 102  Resp: (!) 32 (!) 33 (!) 22 19  Temp:   (!) 97.4 F (36.3 C) 98 F (36.7 C)  TempSrc:   Oral Oral  SpO2: 100% 97% 92% 100%  Weight:      Height:  Intake/Output Summary (Last 24 hours) at 12/09/2017 1652 Last data filed at 12/09/2017 1300 Gross per 24 hour  Intake 820 ml  Output 675 ml  Net 145 ml   Filed Weights   12/04/17 0217 12/05/17 0546 12/06/17 0357  Weight: 71.9 kg (158 lb 8.2 oz) 71.3 kg (157 lb 3 oz) 69.2 kg (152 lb 8 oz)   General: Alert, Awake and Oriented to Time, Place and Person. Appear in moderate distress, affect appropriate, joking with staff, although does not remember events of yesterday. Eyes: PERRL, Conjunctiva normal ENT: Oral  Mucosa clear dry. Neck: difficult to assess JVD, no Abnormal Mass Or lumps Cardiovascular: S1 and S2 Present, no Murmur, Peripheral Pulses Present Respiratory: normal respiratory effort, Bilateral Air entry equal and Decreased, no use of accessory muscle, Clear to Auscultation, no Crackles, no wheezes Abdomen: Bowel Sound present, Soft and no tenderness, no hernia Skin: no redness, no Rash, no induration Extremities: no Pedal edema, no calf tenderness Neurologic: Grossly no focal neuro deficit. Bilaterally Equal motor strength Asterixis resolved  Data Reviewed: CBC: Recent Labs  Lab 12/03/17 0902 12/04/17 0637 12/06/17 1806 12/07/17 0237 12/08/17 0407  WBC 24.1* 25.2* 15.0* 17.4* 12.1*  NEUTROABS  --   --  12.6* 13.7*  --   HGB 12.7* 11.2* 10.3* 11.1* 10.2*  HCT 37.7* 35.0* 30.5* 34.3* 31.0*  MCV 83.0 83.9 83.1 84.1 82.7  PLT 164 129* 158 170 193*   Basic Metabolic Panel: Recent Labs  Lab 12/04/17 0637 12/06/17 1806 12/07/17 0237 12/08/17 0407 12/09/17 1111  NA 131* 135 137 133* 130*  K 3.8 3.6 3.9 3.7 4.0  CL 93* 96* 101 97* 93*  CO2 25 26 22 24 24   GLUCOSE 281* 100* 114* 309* 485*  BUN 33* 52* 57* 43* 21*  CREATININE 1.51* 2.62* 2.51* 1.62* 1.29*  CALCIUM 9.0 8.3* 8.3* 8.2* 8.1*  MG  --   --  2.1  --  1.8   Liver Function Tests: Recent Labs  Lab 12/03/17 0902 12/03/17 1538 12/06/17 1806  AST 19 15 50*  ALT 16* 13* 33  ALKPHOS 79 69 99  BILITOT 0.8 0.9 0.6  PROT 6.6 6.1* 5.3*  ALBUMIN 3.6 3.1* 2.3*   Recent Labs  Lab 12/03/17 1538  LIPASE 23  AMYLASE 27*   Recent Labs  Lab 12/06/17 1806  AMMONIA 13   Cardiac Enzymes: Recent Labs  Lab 12/06/17 1806 12/06/17 2334 12/07/17 0237 12/07/17 1106 12/07/17 1828  CKTOTAL 20*  --   --   --   --   CKMB 1.0  --   --   --   --   TROPONINI 0.13* 0.10* 0.39* 0.08* 0.07*   BNP (last 3 results) Recent Labs    07/16/17 2041 09/05/17 1434 11/08/17 0928  BNP 1,436.0* 1,195.5* 796.6*   CBG: Recent  Labs  Lab 12/08/17 2142 12/08/17 2356 12/09/17 0805 12/09/17 1139 12/09/17 1221  GLUCAP 62* 181* 277* 498* 418*   Recent Results (from the past 240 hour(s))  Culture, blood (Routine X 2) w Reflex to ID Panel     Status: None   Collection Time: 12/04/17 12:40 PM  Result Value Ref Range Status   Specimen Description BLOOD RIGHT ANTECUBITAL  Final   Special Requests IN PEDIATRIC BOTTLE Blood Culture adequate volume  Final   Culture NO GROWTH 5 DAYS  Final   Report Status 12/09/2017 FINAL  Final  Culture, blood (Routine X 2) w Reflex to ID Panel     Status: None  Collection Time: 12/04/17 12:44 PM  Result Value Ref Range Status   Specimen Description BLOOD RIGHT HAND  Final   Special Requests IN PEDIATRIC BOTTLE Blood Culture adequate volume  Final   Culture NO GROWTH 5 DAYS  Final   Report Status 12/09/2017 FINAL  Final  Body fluid culture     Status: None   Collection Time: 12/05/17  3:35 PM  Result Value Ref Range Status   Specimen Description FLUID ABDOMEN  Final   Special Requests Normal  Final   Gram Stain NO WBC SEEN NO ORGANISMS SEEN   Final   Culture No growth aerobically or anaerobically.  Final   Report Status 12/09/2017 FINAL  Final  Culture, blood (x 2)     Status: None (Preliminary result)   Collection Time: 12/06/17  8:21 PM  Result Value Ref Range Status   Specimen Description BLOOD HAND LEFT  Final   Special Requests IN PEDIATRIC BOTTLE Blood Culture adequate volume  Final   Culture NO GROWTH 3 DAYS  Final   Report Status PENDING  Incomplete  Culture, blood (x 2)     Status: None (Preliminary result)   Collection Time: 12/06/17  8:22 PM  Result Value Ref Range Status   Specimen Description BLOOD ARM RIGHT  Final   Special Requests IN PEDIATRIC BOTTLE Blood Culture adequate volume  Final   Culture NO GROWTH 3 DAYS  Final   Report Status PENDING  Incomplete  MRSA PCR Screening     Status: None   Collection Time: 12/06/17  8:50 PM  Result Value Ref Range  Status   MRSA by PCR NEGATIVE NEGATIVE Final    Comment:        The GeneXpert MRSA Assay (FDA approved for NASAL specimens only), is one component of a comprehensive MRSA colonization surveillance program. It is not intended to diagnose MRSA infection nor to guide or monitor treatment for MRSA infections.   Culture, Urine     Status: None   Collection Time: 12/06/17  9:43 PM  Result Value Ref Range Status   Specimen Description URINE, RANDOM  Final   Special Requests NONE  Final   Culture NO GROWTH  Final   Report Status 12/08/2017 FINAL  Final    Studies: No results found.   Scheduled Meds: . clopidogrel  75 mg Oral Daily  . [START ON 12/10/2017] furosemide  80 mg Oral Daily  . insulin aspart  0-5 Units Subcutaneous QHS  . insulin aspart  0-9 Units Subcutaneous TID WC  . insulin aspart  3 Units Subcutaneous TID WC  . insulin detemir  20 Units Subcutaneous QHS  . lidocaine  2 patch Transdermal Q24H  . metoprolol tartrate  12.5 mg Oral QID  . pravastatin  40 mg Oral q1800    Continuous Infusions: . amiodarone 60 mg/hr (12/09/17 1601)   Followed by  . amiodarone    . heparin     PRN Meds: acetaminophen, HYDROmorphone (DILAUDID) injection, nitroGLYCERIN, ondansetron (ZOFRAN) IV, traZODone  Time spent: 35 minutes  Author: Berle Mull, MD [facility] Pager: [phone] 12/09/2017 4:52 PM  If 7PM-7AM, please contact night-coverage at [email or web address], password [facility]

## 2017-12-09 NOTE — Progress Notes (Signed)
ANTICOAGULATION CONSULT NOTE - Follow Up Consult  Pharmacy Consult for apixaban>> Heparin  Indication: atrial fibrillation   Patient Measurements: Height: 5\' 8"  (172.7 cm) Weight: 152 lb 8 oz (69.2 kg) IBW/kg (Calculated) : 68.4  Vital Signs: Temp: 97.4 F (36.3 C) (01/19 1100) Temp Source: Oral (01/19 1100) BP: 149/67 (01/19 1100) Pulse Rate: 139 (01/19 1100)  Labs: Recent Labs    12/06/17 1806 12/06/17 1920  12/07/17 0237 12/07/17 1106 12/07/17 1828 12/08/17 0407  HGB 10.3*  --   --  11.1*  --   --  10.2*  HCT 30.5*  --   --  34.3*  --   --  31.0*  PLT 158  --   --  170  --   --  145*  LABPROT  --  15.7*  --   --   --   --   --   INR  --  1.26  --   --   --   --   --   HEPARINUNFRC  --   --   --   --   --   --  <0.10*  CREATININE 2.62*  --   --  2.51*  --   --  1.62*  CKTOTAL 20*  --   --   --   --   --   --   CKMB 1.0  --   --   --   --   --   --   TROPONINI 0.13*  --    < > 0.39* 0.08* 0.07*  --    < > = values in this interval not displayed.    Estimated Creatinine Clearance: 35.8 mL/min (A) (by C-G formula based on SCr of 1.62 mg/dL (H)).  Assessment: 82 y/o M on heparin for atrial fibrillation.   Plan for evacuation of hematoma Monday. Patient was started on apixaban yesterday by cardiology will transition to heparin in preparation for surgery. Apixaban given this morning so will not start heparin until tonight. Heparin levels will be inaccurate given 3 dose of apixaban so will monitor based on aptt's for now.   Plan to restart heparin at 1100 units/hr tonight around 8pm. Coags with am labs.  Goal of Therapy:  Heparin level 0.3-0.7 units/ml Monitor platelets by anticoagulation protocol: Yes   Plan:  Apixaban stopped Start heparin tonight at 1100 units/hr CBC, heparin level and aptt with am labs  Erin Hearing PharmD., BCPS Clinical Pharmacist 12/09/2017 11:54 AM

## 2017-12-09 NOTE — Progress Notes (Signed)
Patient now in [place] MD notified. Patient is resting with HR 92 and BP 129/77. Currently on Amio & Heparin gtt. No new orders received at this time I will continue to monitor.

## 2017-12-09 NOTE — Progress Notes (Signed)
Progress Note  Patient Name: Victor [name] Date of Encounter: 12/09/2017  Primary Cardiologist: Peter Martinique, MD   Subjective   Cultures from abdominal wall fluid collection remain negative. ID has signed off. TCTS has evaluated and feels that it is non-infected hematoma around epicardial wires from previous PPM in abdominal cavity. Remains AF with RVR on Eliquis.   Still having ab pain but otherwise feeling better. Wants to go home. No SOB.   Wants to be DNR.   Inpatient Medications    Scheduled Meds: . apixaban  2.5 mg Oral BID  . clopidogrel  75 mg Oral Daily  . insulin aspart  0-5 Units Subcutaneous QHS  . insulin aspart  0-9 Units Subcutaneous TID WC  . insulin aspart  3 Units Subcutaneous TID WC  . insulin detemir  20 Units Subcutaneous QHS  . lidocaine  2 patch Transdermal Q24H  . metoprolol tartrate  12.5 mg Oral QID  . pravastatin  40 mg Oral q1800   Continuous Infusions:  PRN Meds: acetaminophen, HYDROmorphone (DILAUDID) injection, nitroGLYCERIN, ondansetron (ZOFRAN) IV, traZODone   Vital Signs    Vitals:   12/09/17 0800 12/09/17 0900 12/09/17 1000 12/09/17 1100  BP: 127/81 136/73 129/78 (!) 149/67  Pulse: (!) 116 (!) 115 (!) 134 (!) 139  Resp: (!) 28 (!) 32 (!) 33 (!) 22  Temp:      TempSrc:      SpO2: 100% 100% 97% 92%  Weight:      Height:        Intake/Output Summary (Last 24 hours) at 12/09/2017 1108 Last data filed at 12/09/2017 1000 Gross per 24 hour  Intake 820 ml  Output 485 ml  Net 335 ml   Filed Weights   12/04/17 0217 12/05/17 0546 12/06/17 0357  Weight: 71.9 kg (158 lb 8.2 oz) 71.3 kg (157 lb 3 oz) 69.2 kg (152 lb 8 oz)    Telemetry    Afib   1001-120s Personally reviewed    Physical Exam   General:  Elderly male lying in bed NAD. No resp difficulty HEENT: normal Neck: supple. JVP 8-9. Carotids 2+ bilat; no bruits. No lymphadenopathy or thryomegaly appreciated. Cor: PMI nondisplaced. IRR tachy No rubs, gallops or  murmurs. Lungs: clear Abdomen: soft, nontender, nondistended.  Hematoma in LUQ tender to palpation No hepatosplenomegaly. No bruits or masses. Good bowel sounds. Extremities: no cyanosis, clubbing, rash, edema Neuro: alert & orientedx3, cranial nerves grossly intact. moves all 4 extremities w/o difficulty. Affect pleasant   Labs    Chemistry Recent Labs  Lab 12/03/17 0902 12/03/17 1538  12/06/17 1806 12/07/17 0237 12/08/17 0407  NA 133*  --    < > 135 137 133*  K 4.0  --    < > 3.6 3.9 3.7  CL 93*  --    < > 96* 101 97*  CO2 27  --    < > 26 22 24   GLUCOSE 288*  --    < > 100* 114* 309*  BUN 29*  --    < > 52* 57* 43*  CREATININE 1.21  --    < > 2.62* 2.51* 1.62*  CALCIUM 9.1  --    < > 8.3* 8.3* 8.2*  PROT 6.6 6.1*  --  5.3*  --   --   ALBUMIN 3.6 3.1*  --  2.3*  --   --   AST 19 15  --  50*  --   --   ALT 16* 13*  --  33  --   --   ALKPHOS 79 69  --  99  --   --   BILITOT 0.8 0.9  --  0.6  --   --   GFRNONAA 55*  --    < > 22* 23* 39*  GFRAA >60  --    < > 25* 26* 45*  ANIONGAP 13  --    < > 13 14 12    < > = values in this interval not displayed.     Hematology Recent Labs  Lab 12/06/17 1806 12/07/17 0237 12/08/17 0407  WBC 15.0* 17.4* 12.1*  RBC 3.67* 4.08* 3.75*  HGB 10.3* 11.1* 10.2*  HCT 30.5* 34.3* 31.0*  MCV 83.1 84.1 82.7  MCH 28.1 27.2 27.2  MCHC 33.8 32.4 32.9  RDW 14.5 14.8 14.5  PLT 158 170 145*    Cardiac Enzymes Recent Labs  Lab 12/06/17 2334 12/07/17 0237 12/07/17 1106 12/07/17 1828  TROPONINI 0.10* 0.39* 0.08* 0.07*   No results for input(s): TROPIPOC in the last 168 hours.   BNPNo results for input(s): BNP, PROBNP in the last 168 hours.   DDimer No results for input(s): DDIMER in the last 168 hours.   Radiology    No results found.  Cardiac Studies     Patient Profile     82 y.o. male past medical history of CAD, s/p CABG, chronic combined systolic/diastolic congestive heart failure, ischemic cardiomyopathy-last EF  35-40%, history of VT status post ICD, hypertension, diabetes mellitus, chronic stage III kidney disease, and PVD admitted with continuous chest pain and abdominal pain. CT scan at an outside hospital showed fluid collection around ICD wires. Troponin minimally elevated.    Assessment & Plan    1. Abdominal wall hematoma (around old epicardial PM wires from abdominal PM) - Fluid cultures negative. Off abx.  - ID has seen and signed off. Remains tender  - I d/w Dr. Prescott Gum. Given size of hematoma and discomfort. Will plan surgical drainage on Monday (can likely due under conscious sedation) - Stop Eliquis for now. Start heparin/   2  Chest pain/CAD - H/o CAD s/p CABG in 1990 with known occlusion of both vein grafts, PCI to SVG-OM3 in 2014 and attempted CTO PCI of RCA 09/2014, - CP does not appear cardiac . Peak trop 0.39. Otherwise flat - medical management  3  Atrial fib  - Appears new this admit. Rate fast. Will add IV amio for now - Continue AC (switch Eliquis to heparin for now) - Consider TEE & DC-CV prior to d/c  4  Acute on chronic systolic CHF   - Echo 03/1760 demonstrated EF 35-40%, akinesis of basal-midinferior myocardium, LA severely dilated.  - Echo 1/19 EF 30-35%.  - ha/s h/o VT now with [employer] ICD - Volume status mildly elevated. (likely exacerbated by AF). Will add back oral diuretics. Watch renal function closely with several episodes of recent AKI  5. Acute on CKD III - Peak creatinine 2.6 due to ATN - Now back to baseline 1.5-1.6  6. Code status -Requests DNR/DNI. Family agrees  Can go to [employer], MD  12/09/2017, 11:08 AM

## 2017-12-10 ENCOUNTER — Encounter (HOSPITAL_COMMUNITY): Payer: Self-pay | Admitting: *Deleted

## 2017-12-10 DIAGNOSIS — I255 Ischemic cardiomyopathy: Secondary | ICD-10-CM

## 2017-12-10 DIAGNOSIS — I481 Persistent atrial fibrillation: Secondary | ICD-10-CM

## 2017-12-10 LAB — BASIC METABOLIC PANEL
ANION GAP: 12 (ref 5–15)
BUN: 17 mg/dL (ref 6–20)
CHLORIDE: 96 mmol/L — AB (ref 101–111)
CO2: 27 mmol/L (ref 22–32)
CREATININE: 1.13 mg/dL (ref 0.61–1.24)
Calcium: 8.5 mg/dL — ABNORMAL LOW (ref 8.9–10.3)
GFR calc non Af Amer: 60 mL/min — ABNORMAL LOW (ref >60)
Glucose, Bld: 148 mg/dL — ABNORMAL HIGH (ref 65–99)
Potassium: 3.7 mmol/L (ref 3.5–5.1)
SODIUM: 135 mmol/L (ref 135–145)

## 2017-12-10 LAB — GLUCOSE, CAPILLARY
GLUCOSE-CAPILLARY: 136 mg/dL — AB (ref 65–99)
GLUCOSE-CAPILLARY: 204 mg/dL — AB (ref 65–99)
GLUCOSE-CAPILLARY: 185 mg/dL — AB (ref 65–99)
Glucose-Capillary: 104 mg/dL — ABNORMAL HIGH (ref 65–99)
Glucose-Capillary: 62 mg/dL — ABNORMAL LOW (ref 65–99)

## 2017-12-10 LAB — CBC
HCT: 30.4 % — ABNORMAL LOW (ref 39.0–52.0)
Hemoglobin: 9.7 g/dL — ABNORMAL LOW (ref 13.0–17.0)
MCH: 26.8 pg (ref 26.0–34.0)
MCHC: 31.9 g/dL (ref 30.0–36.0)
MCV: 84 fL (ref 78.0–100.0)
PLATELETS: 182 10*3/uL (ref 150–400)
RBC: 3.62 MIL/uL — AB (ref 4.22–5.81)
RDW: 14.1 % (ref 11.5–15.5)
WBC: 11.2 10*3/uL — AB (ref 4.0–10.5)

## 2017-12-10 LAB — HEPARIN LEVEL (UNFRACTIONATED): Heparin Unfractionated: 1.22 IU/mL — ABNORMAL HIGH (ref 0.30–0.70)

## 2017-12-10 LAB — APTT
APTT: 62 s — AB (ref 24–36)
aPTT: 56 seconds — ABNORMAL HIGH (ref 24–36)

## 2017-12-10 MED ORDER — METOPROLOL TARTRATE 25 MG PO TABS
25.0000 mg | ORAL_TABLET | Freq: Four times a day (QID) | ORAL | Status: DC
  Administered 2017-12-10 – 2017-12-16 (×23): 25 mg via ORAL
  Filled 2017-12-10 (×22): qty 1

## 2017-12-10 MED ORDER — LEVALBUTEROL HCL 0.63 MG/3ML IN NEBU
0.6300 mg | INHALATION_SOLUTION | Freq: Four times a day (QID) | RESPIRATORY_TRACT | Status: DC
  Administered 2017-12-10: 0.63 mg via RESPIRATORY_TRACT
  Filled 2017-12-10: qty 3

## 2017-12-10 MED ORDER — HEPARIN (PORCINE) IN NACL 100-0.45 UNIT/ML-% IJ SOLN: 1250.0000 [IU]/h | INTRAMUSCULAR | Status: DC

## 2017-12-10 MED ORDER — CEFUROXIME SODIUM 1.5 G IV SOLR
1.5000 g | INTRAVENOUS | Status: DC
  Filled 2017-12-10: qty 1.5

## 2017-12-10 MED ORDER — AMIODARONE HCL 200 MG PO TABS
200.0000 mg | ORAL_TABLET | Freq: Two times a day (BID) | ORAL | Status: DC
  Administered 2017-12-10 – 2017-12-16 (×12): 200 mg via ORAL
  Filled 2017-12-10 (×12): qty 1

## 2017-12-10 MED ORDER — OXYCODONE HCL 5 MG PO TABS
5.0000 mg | ORAL_TABLET | ORAL | Status: DC | PRN
  Administered 2017-12-10 – 2017-12-14 (×10): 5 mg via ORAL
  Filled 2017-12-10 (×10): qty 1

## 2017-12-10 MED ORDER — LEVALBUTEROL HCL 0.63 MG/3ML IN NEBU: 0.6300 mg | INHALATION_SOLUTION | RESPIRATORY_TRACT | Status: DC | PRN

## 2017-12-10 MED ORDER — HEPARIN (PORCINE) IN NACL 100-0.45 UNIT/ML-% IJ SOLN
1350.0000 [IU]/h | INTRAMUSCULAR | Status: DC
  Filled 2017-12-10: qty 250

## 2017-12-10 MED ORDER — VANCOMYCIN HCL 1000 MG IV SOLR
INTRAVENOUS | Status: DC
  Filled 2017-12-10: qty 1000

## 2017-12-10 NOTE — Progress Notes (Signed)
ANTICOAGULATION CONSULT NOTE - Follow Up Consult  Pharmacy Consult for Apixaban>> Heparin  Indication: atrial fibrillation   Patient Measurements: Height: 5\' 8"  (172.7 cm) Weight: 157 lb 10.1 oz (71.5 kg) IBW/kg (Calculated) : 68.4  Vital Signs: Temp: 97.9 F (36.6 C) (01/20 0814) Temp Source: Oral (01/20 0814) BP: 136/60 (01/20 0814) Pulse Rate: 97 (01/20 0814)  Labs: Recent Labs    12/07/17 1828 12/08/17 0407 12/09/17 1111 12/10/17 0321 12/10/17 1134  HGB  --  10.2*  --  9.7*  --   HCT  --  31.0*  --  30.4*  --   PLT  --  145*  --  182  --   APTT  --   --   --  56* 62*  HEPARINUNFRC  --  <0.10*  --  1.22*  --   CREATININE  --  1.62* 1.29* 1.13  --   TROPONINI 0.07*  --   --   --   --     Estimated Creatinine Clearance: 51.3 mL/min (by C-G formula based on SCr of 1.13 mg/dL).  Assessment: 82 y/o M on heparin for atrial fibrillation.   Plan for evacuation of hematoma Monday. Patient was started on apixaban 1/18 by cardiology will transition to heparin in preparation for surgery.  1/20 : aPTT this AM is just below goal, using aPTT to dose for now given Apixaban influence on anti-Xa levels. No issues per RN.   Surgery planned for tomorrow morning, stop date for heparin in place.  Goal of Therapy:  Aptt goal 66-102 Heparin level 0.3-0.7 units/ml Monitor platelets by anticoagulation protocol: Yes   Plan:  Inc heparin to 1350 units/hr - off at 0500 1/21   Erin Hearing PharmD., BCPS Clinical Pharmacist 12/10/2017 1:06 PM

## 2017-12-10 NOTE — Progress Notes (Signed)
Procedure(s) (LRB): IRRIGATION AND DEBRIDEMENT OF CHEST HEMATOMA (N/A) Subjective: Patient examined Patient discussed with his cardiologist Dr. Haroldine Laws for coordination of care He has a posttraumatic hematoma of his lower chest upper abdomen close to an old AICD pocket. It was aspirated and is not infected. He continues to be painful and the patient will need more anticoagulation with Eliquis for his atrial flutter. Evacuation of hematoma and placement of a small drain for a few days will be optimal therapy for his problem and should prevent further expansion of the hematoma and/or infection. I discussed the procedure with the patient which will be scheduled for tomorrow under general anesthesia. Heparin will be stopped in a.m.  Objective: Vital signs in last 24 hours: Temp:  [97.7 F (36.5 C)-98.1 F (36.7 C)] 97.9 F (36.6 C) (01/20 0814) Pulse Rate:  [86-102] 97 (01/20 0814) Cardiac Rhythm: Atrial fibrillation;Bundle branch block (01/20 0800) Resp:  [15-26] 15 (01/20 0814) BP: (117-136)/(60-80) 136/60 (01/20 0814) SpO2:  [98 %-100 %] 99 % (01/20 0817) Weight:  [157 lb 10.1 oz (71.5 kg)] 157 lb 10.1 oz (71.5 kg) (01/20 0646)  Hemodynamic parameters for last 24 hours:    Intake/Output from previous day: 01/19 0701 - 01/20 0700 In: 1215.7 [P.O.:960; I.V.:255.7] Out: 850 [Urine:850] Intake/Output this shift: Total I/O In: 298.4 [P.O.:240; I.V.:58.4] Out: 200 [Urine:200]  Chronically ill-appearing gentleman No acute distress Breath sounds clear Heart rate atrial flutter 70-80/m Swollen area at the left costal margin without erythema but tender representing a hematoma  Lab Results: Recent Labs    12/08/17 0407 12/10/17 0321  WBC 12.1* 11.2*  HGB 10.2* 9.7*  HCT 31.0* 30.4*  PLT 145* 182   BMET:  Recent Labs    12/09/17 1111 12/10/17 0321  NA 130* 135  K 4.0 3.7  CL 93* 96*  CO2 24 27  GLUCOSE 485* 148*  BUN 21* 17  CREATININE 1.29* 1.13  CALCIUM 8.1* 8.5*     PT/INR: No results for input(s): LABPROT, INR in the last 72 hours. ABG    Component Value Date/Time   PHART 7.475 (H) 12/06/2017 1730   HCO3 27.4 12/06/2017 1730   TCO2 32 03/20/2015 1638   O2SAT 98.1 12/06/2017 1730   CBG (last 3)  Recent Labs    12/09/17 2134 12/10/17 0809 12/10/17 1212  GLUCAP 122* 136* 204*    Assessment/Plan: S/P Procedure(s) (LRB): IRRIGATION AND DEBRIDEMENT OF CHEST HEMATOMA (N/A) Drainage and evacuation of hematoma at the site of a previous AICD pocket tomorrow in OR   LOS: 7 days    Tharon Aquas Trigt III 12/10/2017

## 2017-12-10 NOTE — Progress Notes (Signed)
[facility] Consultation Progress Note  Patient: Victor [name] NTI:[id number]   PCP: Cyndi Bender, PA-C DOB: 01/19/1936   DOA: 12/03/2017   DOS: 12/10/2017   Date of Service: the patient was seen and examined on 12/10/2017 Primary service: Martinique, Peter M, MD   Subjective: hear rate improved, still in flutter on tele, has shortnes of breath and chest wall pain, no nausea or vomiting. Mentation still better.   Brief hospital course: THORIN STARNER is a 82 y.o. male with Past medical history of CAD S/P CABG, ischemic cardiomyopathy with chronic combined CHF, chronic kidney disease stage III, arthritis, HTN, chronic respiratory failure on 2 L of oxygen, CVA, PVD, type II DM on chronic insulin. Patient is coming from [facility]. Patient presented with complaints of chest pain prior to be musculoskeletal without any benefit from nitroglycerin.  CT PE in the other facility was negative for dissection or PE.  Patient was transferred to [facility] for further workup for his chest pain as well as the finding of fluid surrounding epicardial pacer. During the stay in the hospital patient continues to have chest pain as well as diffuse abdominal pain.  Interventional radiology as well as cardio thoracic surgery was consulted for fluid collection. Patient underwent ultrasound-guided fluid aspiration on 12/05/2017 30 mL fluid brown thick was removed.  Still large amount of fluid remains in the pocket which will require surgical removal. Patient's Cymbalta was increased from 30 mg daily to 60 mg daily.  50 mg 3 times daily as needed, last dose was on 9.11 on 12/06/2017.  Zanaflex was also started last dose was on 11.22 on 12/06/2017. On 12/06/2017 after receiving Zanaflex and tramadol the patient become more lethargic and sleepy and drowsy.  Later during the day the patient was not able to stay awake.  Rapid response was called for further assistance.  Hospitalist was called for further management  of acute encephalopathy. At the time of my evaluation rapid response nurse was at bedside, patient was drowsy and lethargic but arousable on verbal command.  Nonverbal following commands and falling asleep during the conversation.  Blood glucose was 138, blood pressure was 76/42 without any tachycardia. Transferred to ICU on 12/06/2017, started on levo fed and IV fluid with improvement in mentation as well as blood pressure.  Currently further plan is continue supportive care, DC cardioversion as well as removal of the pocket wall hematoma on Monday.  Assessment and Plan: 1.  respiratory distress Acute hypoxic respiratory failure Acute on chronic systolic CHF Likely combination for acute on chronic systolic CHF and bronchitis xopenex added. CHF exacerbation in last 48 hours due to tachycardia Received diuresis in beginning of the stay with worsening renal function and needed fluids, now back on lasix.  Management of CHF per cardiology   2. acute kidney injury on chronic kidney disease stage III. Renal function on admission was showing GFR 55, serum creatinine 1.21.  On 12/04/2017 it was 1.51. Creatinine almost back to baseline. Adequate urine output. Likely prerenal etiology with over diuresis.  There is a potential for it to further worsen due to hypotensive episode yesterday with possible ATN. Continue to Avoid nephrotoxic medications. Lasix and Cozaar has been on hold since 12/06/2017 Received gentle hydration until 12/08/2017.   Lasix starting on 12/08/2017.  Monitor Renal function with reintroduction of diuresis.   3.  Type 2 diabetes mellitus. Uncontrolled with hyperglycemia and hypoglycemia Patient's home dose 30 units of Lantus. Dose was reduced to Lantus to 15 units  with sensitive sliding scale every 4 hours. Now that the patient is more awake, will advance the sliding scale. Skipped his dinner therefore become hypoglycemic in night Currently on 20 units of long-acting insulin,  sensitive sliding scale as well as 3 units of meal coverage. Well controlled for now  4. Acute metabolic encephalopathy. Now resolved toxic in the setting of polypharmacy. Patient received increased dose of Cymbalta, Lyrica, new medication tramadol as well as Zanaflex. Remained lethargic throughout the day on 12/06/2017.  Significant improvement on 12/07/2017,  Asterixis is currently resolved since 12/08/2017. ABG shows no hypercarbia mild hypoxia. Patient received IV Narcan without any significant benefit, also received 1 dose of IV glucagon for beta-blocker reversal. Continue to avoid psychotropic medications.  5.  Leukocytosis. Epicardial system pocket wall hematoma. Rectus sheath hematoma. Suspected epicardial pocket infection. Chest wall pain Hb appears relatively stable. Will monitor. Ultrasound-guided aspiration was performed which shows no organisms so far. Blood cultures are also negative. Infectious disease consulted recommend no antibiotic right now. Cardio thoracic surgery was consulted, patient may require surgical removal of all the fluid. With rectal temp of 102 patient was empirically started on IV antibiotics with suspicion for sepsis. Significant improvement overnight with supportive measures, less likely sepsis.  Discontinue antibiotics.  Blood culture negative till now. Pain control with k pad and lidocaine patch. Avoid psychotropic meds, no more asterixis.   5. ST elevation on EKG H/O CAD Management per cardiology,.  EKG changes not consistent with STEMI  6.  Goals of care discussion. Patient wanted to change his CODE STATUS from full code to DNR/DNI. Patient's daughter was at bedside as well as son was aware of this decision per family. Patient was also given a copy of MOST form to review.  7.  A. fib with RVR. Management per cardiology. Plan is to perform cardioversion on Monday. On heparin for anticoagulation  Bowel regimen: last BM 12/06/2016 Diet:  advance ad tolerated to cardiac and carb modified diet DVT Prophylaxis: subcutaneous Heparin  Family Communication: family was present at bedside, at the time of interview.   Disposition: We will continue to follow the patient.  Family informed me that they don't think that they have enough help at home to manage patient's care.  Recommendation on discharge: Pending further improvement  Other Consultants: ID, CCM Procedures:  Antibiotics: Anti-infectives (From admission, onward)   Start     Dose/Rate Route Frequency Ordered Stop   12/07/17 1830  vancomycin (VANCOCIN) IVPB 1000 mg/200 mL premix  Status:  Discontinued     1,000 mg 200 mL/hr over 60 Minutes Intravenous Every 24 hours 12/06/17 1843 12/07/17 0917   12/07/17 0400  piperacillin-tazobactam (ZOSYN) IVPB 3.375 g  Status:  Discontinued     3.375 g 12.5 mL/hr over 240 Minutes Intravenous Every 8 hours 12/06/17 1843 12/07/17 0917   12/06/17 1900  piperacillin-tazobactam (ZOSYN) IVPB 3.375 g     3.375 g 100 mL/hr over 30 Minutes Intravenous  Once 12/06/17 1839 12/06/17 2030   12/06/17 1900  vancomycin (VANCOCIN) IVPB 1000 mg/200 mL premix     1,000 mg 200 mL/hr over 60 Minutes Intravenous  Once 12/06/17 1839 12/06/17 2140      Objective: Physical Exam: Vitals:   12/09/17 1933 12/09/17 2250 12/10/17 0339 12/10/17 0646  BP: 121/80 129/77 117/64   Pulse: 86 100 96   Resp: (!) 26 (!) 23 18   Temp: 98.1 F (36.7 C) 97.7 F (36.5 C) 97.9 F (36.6 C)   TempSrc: Oral Oral Oral  SpO2: 100% 100% 100%   Weight:    71.5 kg (157 lb 10.1 oz)  Height:        Intake/Output Summary (Last 24 hours) at 12/10/2017 0801 Last data filed at 12/10/2017 0600 Gross per 24 hour  Intake 1095.65 ml  Output 850 ml  Net 245.65 ml   Filed Weights   12/05/17 0546 12/06/17 0357 12/10/17 0646  Weight: 71.3 kg (157 lb 3 oz) 69.2 kg (152 lb 8 oz) 71.5 kg (157 lb 10.1 oz)   General: Alert, Awake and Oriented to Time, Place and Person. Appear in  moderate distress, affect appropriate, joking with staff, although does not remember events of yesterday. Eyes: PERRL, Conjunctiva normal ENT: Oral Mucosa clear dry. Neck: difficult to assess JVD, no Abnormal Mass Or lumps Cardiovascular: S1 and S2 Present, no Murmur, Peripheral Pulses Present Respiratory: normal respiratory effort, Bilateral Air entry equal and Decreased, no use of accessory muscle, Clear to Auscultation, no Crackles, no wheezes Abdomen: Bowel Sound present, Soft and no tenderness, no hernia Skin: no redness, no Rash, no induration Extremities: no Pedal edema, no calf tenderness Neurologic: Grossly no focal neuro deficit. Bilaterally Equal motor strength Asterixis resolved  Data Reviewed: CBC: Recent Labs  Lab 12/04/17 0637 12/06/17 1806 12/07/17 0237 12/08/17 0407 12/10/17 0321  WBC 25.2* 15.0* 17.4* 12.1* 11.2*  NEUTROABS  --  12.6* 13.7*  --   --   HGB 11.2* 10.3* 11.1* 10.2* 9.7*  HCT 35.0* 30.5* 34.3* 31.0* 30.4*  MCV 83.9 83.1 84.1 82.7 84.0  PLT 129* 158 170 145* 353   Basic Metabolic Panel: Recent Labs  Lab 12/06/17 1806 12/07/17 0237 12/08/17 0407 12/09/17 1111 12/10/17 0321  NA 135 137 133* 130* 135  K 3.6 3.9 3.7 4.0 3.7  CL 96* 101 97* 93* 96*  CO2 26 22 24 24 27   GLUCOSE 100* 114* 309* 485* 148*  BUN 52* 57* 43* 21* 17  CREATININE 2.62* 2.51* 1.62* 1.29* 1.13  CALCIUM 8.3* 8.3* 8.2* 8.1* 8.5*  MG  --  2.1  --  1.8  --    Liver Function Tests: Recent Labs  Lab 12/03/17 0902 12/03/17 1538 12/06/17 1806  AST 19 15 50*  ALT 16* 13* 33  ALKPHOS 79 69 99  BILITOT 0.8 0.9 0.6  PROT 6.6 6.1* 5.3*  ALBUMIN 3.6 3.1* 2.3*   Recent Labs  Lab 12/03/17 1538  LIPASE 23  AMYLASE 27*   Recent Labs  Lab 12/06/17 1806  AMMONIA 13   Cardiac Enzymes: Recent Labs  Lab 12/06/17 1806 12/06/17 2334 12/07/17 0237 12/07/17 1106 12/07/17 1828  CKTOTAL 20*  --   --   --   --   CKMB 1.0  --   --   --   --   TROPONINI 0.13* 0.10* 0.39*  0.08* 0.07*   BNP (last 3 results) Recent Labs    07/16/17 2041 09/05/17 1434 11/08/17 0928  BNP 1,436.0* 1,195.5* 796.6*   CBG: Recent Labs  Lab 12/09/17 0805 12/09/17 1139 12/09/17 1221 12/09/17 1702 12/09/17 2134  GLUCAP 277* 498* 418* 199* 122*   Recent Results (from the past 240 hour(s))  Culture, blood (Routine X 2) w Reflex to ID Panel     Status: None   Collection Time: 12/04/17 12:40 PM  Result Value Ref Range Status   Specimen Description BLOOD RIGHT ANTECUBITAL  Final   Special Requests IN PEDIATRIC BOTTLE Blood Culture adequate volume  Final   Culture NO GROWTH 5 DAYS  Final  Report Status 12/09/2017 FINAL  Final  Culture, blood (Routine X 2) w Reflex to ID Panel     Status: None   Collection Time: 12/04/17 12:44 PM  Result Value Ref Range Status   Specimen Description BLOOD RIGHT HAND  Final   Special Requests IN PEDIATRIC BOTTLE Blood Culture adequate volume  Final   Culture NO GROWTH 5 DAYS  Final   Report Status 12/09/2017 FINAL  Final  Body fluid culture     Status: None   Collection Time: 12/05/17  3:35 PM  Result Value Ref Range Status   Specimen Description FLUID ABDOMEN  Final   Special Requests Normal  Final   Gram Stain NO WBC SEEN NO ORGANISMS SEEN   Final   Culture No growth aerobically or anaerobically.  Final   Report Status 12/09/2017 FINAL  Final  Culture, blood (x 2)     Status: None (Preliminary result)   Collection Time: 12/06/17  8:21 PM  Result Value Ref Range Status   Specimen Description BLOOD HAND LEFT  Final   Special Requests IN PEDIATRIC BOTTLE Blood Culture adequate volume  Final   Culture NO GROWTH 3 DAYS  Final   Report Status PENDING  Incomplete  Culture, blood (x 2)     Status: None (Preliminary result)   Collection Time: 12/06/17  8:22 PM  Result Value Ref Range Status   Specimen Description BLOOD ARM RIGHT  Final   Special Requests IN PEDIATRIC BOTTLE Blood Culture adequate volume  Final   Culture NO GROWTH 3  DAYS  Final   Report Status PENDING  Incomplete  MRSA PCR Screening     Status: None   Collection Time: 12/06/17  8:50 PM  Result Value Ref Range Status   MRSA by PCR NEGATIVE NEGATIVE Final    Comment:        The GeneXpert MRSA Assay (FDA approved for NASAL specimens only), is one component of a comprehensive MRSA colonization surveillance program. It is not intended to diagnose MRSA infection nor to guide or monitor treatment for MRSA infections.   Culture, Urine     Status: None   Collection Time: 12/06/17  9:43 PM  Result Value Ref Range Status   Specimen Description URINE, RANDOM  Final   Special Requests NONE  Final   Culture NO GROWTH  Final   Report Status 12/08/2017 FINAL  Final    Studies: Dg Chest Port 1 View  Result Date: 12/09/2017 CLINICAL DATA:  Shortness of breath EXAM: PORTABLE CHEST 1 VIEW COMPARISON:  12/06/2017 FINDINGS: Chronic cardiomegaly. Status post CABG. There is trans venous and direct epicardial pacer leads in stable position. Low volume chest with interstitial crowding and asymmetric streaky retrocardiac density. There is no edema, air bronchogram, effusion, or pneumothorax. IMPRESSION: Stable low lung volumes with probable retrocardiac atelectasis. Electronically Signed   By: Monte Fantasia M.D.   On: 12/09/2017 19:48     Scheduled Meds: . clopidogrel  75 mg Oral Daily  . furosemide  80 mg Oral Daily  . insulin aspart  0-5 Units Subcutaneous QHS  . insulin aspart  0-9 Units Subcutaneous TID WC  . insulin aspart  3 Units Subcutaneous TID WC  . insulin detemir  20 Units Subcutaneous QHS  . levalbuterol  0.63 mg Nebulization Q6H  . lidocaine  2 patch Transdermal Q24H  . metoprolol tartrate  12.5 mg Oral QID  . pravastatin  40 mg Oral q1800    Continuous Infusions: . amiodarone 30 mg/hr (12/10/17  0600)  . heparin 1,250 Units/hr (12/10/17 0600)   PRN Meds: acetaminophen, HYDROmorphone (DILAUDID) injection, nitroGLYCERIN, ondansetron (ZOFRAN)  IV, oxyCODONE, traZODone  Time spent: 35 minutes  Author: Berle Mull, MD [facility] Pager: [phone] 12/10/2017 8:01 AM  If 7PM-7AM, please contact night-coverage at [email or web address], password [facility]

## 2017-12-10 NOTE — Progress Notes (Addendum)
Progress Note   Subjective   Doing well today.  Chest wall soreness persists.  Inpatient Medications    Scheduled Meds: . [START ON 12/11/2017] cefUROXime  1.5 g Intravenous To SS-Surg  . clopidogrel  75 mg Oral Daily  . furosemide  80 mg Oral Daily  . insulin aspart  0-5 Units Subcutaneous QHS  . insulin aspart  0-9 Units Subcutaneous TID WC  . insulin aspart  3 Units Subcutaneous TID WC  . insulin detemir  20 Units Subcutaneous QHS  . levalbuterol  0.63 mg Nebulization Q6H  . lidocaine  2 patch Transdermal Q24H  . metoprolol tartrate  12.5 mg Oral QID  . pravastatin  40 mg Oral q1800   Continuous Infusions: . amiodarone 30 mg/hr (12/10/17 0800)  . heparin     PRN Meds: acetaminophen, HYDROmorphone (DILAUDID) injection, nitroGLYCERIN, ondansetron (ZOFRAN) IV, oxyCODONE, traZODone   Vital Signs    Vitals:   12/10/17 0339 12/10/17 0646 12/10/17 0814 12/10/17 0817  BP: 117/64  136/60   Pulse: 96  97   Resp: 18  15   Temp: 97.9 F (36.6 C)  97.9 F (36.6 C)   TempSrc: Oral  Oral   SpO2: 100%  98% 99%  Weight:  157 lb 10.1 oz (71.5 kg)    Height:        Intake/Output Summary (Last 24 hours) at 12/10/2017 1159 Last data filed at 12/10/2017 0800 Gross per 24 hour  Intake 1034.05 ml  Output 1050 ml  Net -15.95 ml   Filed Weights   12/05/17 0546 12/06/17 0357 12/10/17 0646  Weight: 157 lb 3 oz (71.3 kg) 152 lb 8 oz (69.2 kg) 157 lb 10.1 oz (71.5 kg)    Telemetry    Rate controlled afib - Personally Reviewed  Physical Exam   GEN- The patient is el;derly appearing, alert and oriented x 3 today.   Head- normocephalic, atraumatic Eyes-  Sclera clear, conjunctiva pink Ears- hearing intact Oropharynx- clear Neck- supple, Lungs- Clear to ausculation bilaterally, normal work of breathing Heart- irregular rate and rhythm  GI- soft, NT, ND, + BS Extremities- no clubbing, cyanosis, or edema  MS- no significant deformity or atrophy Skin- moderate tenderness  along left  Upper abdomen at site of his prior epicardial ICD Psych- euthymic mood, full affect Neuro- strength and sensation are intact   Labs    Chemistry Recent Labs  Lab 12/03/17 1538  12/06/17 1806  12/08/17 0407 12/09/17 1111 12/10/17 0321  NA  --    < > 135   < > 133* 130* 135  K  --    < > 3.6   < > 3.7 4.0 3.7  CL  --    < > 96*   < > 97* 93* 96*  CO2  --    < > 26   < > 24 24 27   GLUCOSE  --    < > 100*   < > 309* 485* 148*  BUN  --    < > 52*   < > 43* 21* 17  CREATININE  --    < > 2.62*   < > 1.62* 1.29* 1.13  CALCIUM  --    < > 8.3*   < > 8.2* 8.1* 8.5*  PROT 6.1*  --  5.3*  --   --   --   --   ALBUMIN 3.1*  --  2.3*  --   --   --   --  AST 15  --  50*  --   --   --   --   ALT 13*  --  33  --   --   --   --   ALKPHOS 69  --  99  --   --   --   --   BILITOT 0.9  --  0.6  --   --   --   --   GFRNONAA  --    < > 22*   < > 39* 51* 60*  GFRAA  --    < > 25*   < > 45* 59* >60  ANIONGAP  --    < > 13   < > 12 13 12    < > = values in this interval not displayed.     Hematology Recent Labs  Lab 12/07/17 0237 12/08/17 0407 12/10/17 0321  WBC 17.4* 12.1* 11.2*  RBC 4.08* 3.75* 3.62*  HGB 11.1* 10.2* 9.7*  HCT 34.3* 31.0* 30.4*  MCV 84.1 82.7 84.0  MCH 27.2 27.2 26.8  MCHC 32.4 32.9 31.9  RDW 14.8 14.5 14.1  PLT 170 145* 182    Cardiac Enzymes Recent Labs  Lab 12/06/17 2334 12/07/17 0237 12/07/17 1106 12/07/17 1828  TROPONINI 0.10* 0.39* 0.08* 0.07*   No results for input(s): TROPIPOC in the last 168 hours.      Assessment & Plan    1.  Abdominal wall hematoma Prior notes, including ID and EP notes reviewed.  Also discussed with Dr Prescott Gum this am.  Likely abdominal wound hematoma secondary to his dog jumping on his chest recently.  He was on ASA and plavix at that time.  He has since been started on eliquis also for afib. On review of Dr Doug Sou notes from 10/18, he advised stopping plavix at that time however the patient did not stop it. I  will therefore stop plavix today. Upon discussion with Dr Prescott Gum this am, plan is for evacuation tomorrow mid day. Restart eliquis when surgically able afterwards  2. Persistent afib New diagnosis Prior echos have demonstrated severe LA enlargement, however most recent echo suggests normal LA size (unlikely).  I worry that our ability to maintain sinus rhythm long term may be low. Resume eliquis post operatively Will convert amiodarone to oral at this time. Consider cardioversion in 3-4 weeks (after resumption of anticoagulation).  Ultimately, rate control may be his optoin. Continue to titrate metoprolol as tolerated  3. CAD No ischemic symptoms Stop plavix (as per Dr Morrison Old note in October)  4. Acute on chronic systolic dysfunction Stable No change required today He has a transvenous ICD in addition to his abandoned abdominal system  DNR/DNI To telemetry after hematoma evacuation tomorrow Hopefully home in the next few days  Thompson Grayer MD, [facility] 12/10/2017 11:59 AM

## 2017-12-10 NOTE — Progress Notes (Signed)
ANTICOAGULATION CONSULT NOTE - Follow Up Consult  Pharmacy Consult for Apixaban>> Heparin  Indication: atrial fibrillation   Patient Measurements: Height: 5\' 8"  (172.7 cm) Weight: 152 lb 8 oz (69.2 kg) IBW/kg (Calculated) : 68.4  Vital Signs: Temp: 97.9 F (36.6 C) (01/20 0339) Temp Source: Oral (01/20 0339) BP: 117/64 (01/20 0339) Pulse Rate: 96 (01/20 0339)  Labs: Recent Labs    12/07/17 1106 12/07/17 1828 12/08/17 0407 12/09/17 1111 12/10/17 0321  HGB  --   --  10.2*  --  9.7*  HCT  --   --  31.0*  --  30.4*  PLT  --   --  145*  --  182  APTT  --   --   --   --  56*  HEPARINUNFRC  --   --  <0.10*  --  1.22*  CREATININE  --   --  1.62* 1.29* 1.13  TROPONINI 0.08* 0.07*  --   --   --     Estimated Creatinine Clearance: 51.3 mL/min (by C-G formula based on SCr of 1.13 mg/dL).  Assessment: 82 y/o M on heparin for atrial fibrillation.   Plan for evacuation of hematoma Monday. Patient was started on apixaban 1/18 by cardiology will transition to heparin in preparation for surgery. Apixaban given this morning so will not start heparin until tonight  1/20 AM: aPTT this AM is sub-therapeutic, using aPTT to dose for now given Apixaban influence on anti-Xa levels. No issues per RN.   Goal of Therapy:  Heparin level 0.3-0.7 units/ml Monitor platelets by anticoagulation protocol: Yes   Plan:  Inc heparin to 1250 units/hr 1400 aPTT  Narda Bonds, PharmD, BCPS Clinical Pharmacist Phone: [phone]

## 2017-12-11 ENCOUNTER — Inpatient Hospital Stay (HOSPITAL_COMMUNITY): Payer: Medicare Other | Admitting: Anesthesiology

## 2017-12-11 ENCOUNTER — Encounter: Payer: Self-pay | Admitting: Surgery

## 2017-12-11 ENCOUNTER — Encounter (HOSPITAL_COMMUNITY)
Admission: AD | Disposition: A | Discharge status: Home Health | Payer: Self-pay | Source: Other Acute Inpatient Hospital | Attending: Cardiology

## 2017-12-11 HISTORY — PX: CHEST EXPLORATION: SHX5104

## 2017-12-11 LAB — BASIC METABOLIC PANEL
Anion gap: 12 (ref 5–15)
BUN: 16 mg/dL (ref 6–20)
CHLORIDE: 96 mmol/L — AB (ref 101–111)
CO2: 29 mmol/L (ref 22–32)
CREATININE: 1.24 mg/dL (ref 0.61–1.24)
Calcium: 8.5 mg/dL — ABNORMAL LOW (ref 8.9–10.3)
GFR calc Af Amer: >60 mL/min (ref >60)
GFR calc non Af Amer: 54 mL/min — ABNORMAL LOW (ref >60)
GLUCOSE: 104 mg/dL — AB (ref 65–99)
POTASSIUM: 3.8 mmol/L (ref 3.5–5.1)
SODIUM: 137 mmol/L (ref 135–145)

## 2017-12-11 LAB — CULTURE, BLOOD (ROUTINE X 2)
Culture: NO GROWTH
Culture: NO GROWTH
SPECIAL REQUESTS: ADEQUATE
Special Requests: ADEQUATE

## 2017-12-11 LAB — CBC
HEMATOCRIT: 32.1 % — AB (ref 39.0–52.0)
HEMOGLOBIN: 10.3 g/dL — AB (ref 13.0–17.0)
MCH: 27.2 pg (ref 26.0–34.0)
MCHC: 32.1 g/dL (ref 30.0–36.0)
MCV: 84.9 fL (ref 78.0–100.0)
Platelets: 237 10*3/uL (ref 150–400)
RBC: 3.78 MIL/uL — AB (ref 4.22–5.81)
RDW: 14.4 % (ref 11.5–15.5)
WBC: 10.6 10*3/uL — ABNORMAL HIGH (ref 4.0–10.5)

## 2017-12-11 LAB — GLUCOSE, CAPILLARY
GLUCOSE-CAPILLARY: 94 mg/dL (ref 65–99)
GLUCOSE-CAPILLARY: 507 mg/dL — AB (ref 65–99)
GLUCOSE-CAPILLARY: 411 mg/dL — AB (ref 65–99)
Glucose-Capillary: 56 mg/dL — ABNORMAL LOW (ref 65–99)
Glucose-Capillary: 98 mg/dL (ref 65–99)
Glucose-Capillary: 132 mg/dL — ABNORMAL HIGH (ref 65–99)
Glucose-Capillary: 321 mg/dL — ABNORMAL HIGH (ref 65–99)

## 2017-12-11 LAB — APTT: aPTT: 64 seconds — ABNORMAL HIGH (ref 24–36)

## 2017-12-11 LAB — HEPARIN LEVEL (UNFRACTIONATED): Heparin Unfractionated: 0.76 IU/mL — ABNORMAL HIGH (ref 0.30–0.70)

## 2017-12-11 SURGERY — EXPLORATION, CHEST
Anesthesia: General

## 2017-12-11 MED ORDER — FENTANYL CITRATE (PF) 250 MCG/5ML IJ SOLN
INTRAMUSCULAR | Status: AC
  Filled 2017-12-11: qty 5

## 2017-12-11 MED ORDER — DEXTROSE 5 % IV SOLN
INTRAVENOUS | Status: DC | PRN
  Administered 2017-12-11: 1.5 g via INTRAVENOUS

## 2017-12-11 MED ORDER — INSULIN ASPART 100 UNIT/ML ~~LOC~~ SOLN
0.0000 [IU] | SUBCUTANEOUS | Status: DC
  Administered 2017-12-11: 7 [IU] via SUBCUTANEOUS
  Administered 2017-12-11: 9 [IU] via SUBCUTANEOUS

## 2017-12-11 MED ORDER — PHENYLEPHRINE HCL 10 MG/ML IJ SOLN
INTRAMUSCULAR | Status: DC | PRN
  Administered 2017-12-11: 25 ug/min via INTRAVENOUS

## 2017-12-11 MED ORDER — DEXTROSE 5 % IV SOLN: INTRAVENOUS | Status: DC

## 2017-12-11 MED ORDER — FENTANYL CITRATE (PF) 100 MCG/2ML IJ SOLN
INTRAMUSCULAR | Status: DC | PRN
  Administered 2017-12-11: 50 ug via INTRAVENOUS

## 2017-12-11 MED ORDER — SODIUM CHLORIDE 0.9 % IR SOLN
Status: DC | PRN
  Administered 2017-12-11: 3000 mL

## 2017-12-11 MED ORDER — GLUCOSE 40 % PO GEL
ORAL | Status: AC
  Filled 2017-12-11: qty 1

## 2017-12-11 MED ORDER — INSULIN ASPART 100 UNIT/ML ~~LOC~~ SOLN
0.0000 [IU] | Freq: Every day | SUBCUTANEOUS | Status: DC
  Administered 2017-12-11: 5 [IU] via SUBCUTANEOUS
  Administered 2017-12-12: 2 [IU] via SUBCUTANEOUS

## 2017-12-11 MED ORDER — INSULIN ASPART 100 UNIT/ML ~~LOC~~ SOLN: 0.0000 [IU] | Freq: Every day | SUBCUTANEOUS | Status: DC

## 2017-12-11 MED ORDER — PROPOFOL 10 MG/ML IV BOLUS
INTRAVENOUS | Status: AC
  Filled 2017-12-11: qty 20

## 2017-12-11 MED ORDER — LACTATED RINGERS IV SOLN
INTRAVENOUS | Status: DC | PRN
  Administered 2017-12-11: 12:00:00 via INTRAVENOUS

## 2017-12-11 MED ORDER — LIDOCAINE HCL 1 % IJ SOLN
INTRAMUSCULAR | Status: DC | PRN
  Administered 2017-12-11: 30 mL

## 2017-12-11 MED ORDER — LIDOCAINE HCL (CARDIAC) 20 MG/ML IV SOLN
INTRAVENOUS | Status: DC | PRN
  Administered 2017-12-11: 100 mg via INTRAVENOUS

## 2017-12-11 MED ORDER — SODIUM CHLORIDE 0.9 % IV SOLN
INTRAVENOUS | Status: DC
  Administered 2017-12-11 – 2017-12-12 (×2): via INTRAVENOUS

## 2017-12-11 MED ORDER — ONDANSETRON HCL 4 MG/2ML IJ SOLN
INTRAMUSCULAR | Status: DC | PRN
  Administered 2017-12-11: 4 mg via INTRAVENOUS

## 2017-12-11 MED ORDER — LIDOCAINE HCL (PF) 1 % IJ SOLN
INTRAMUSCULAR | Status: AC
  Filled 2017-12-11: qty 30

## 2017-12-11 MED ORDER — DEXTROSE 50 % IV SOLN
INTRAVENOUS | Status: AC
  Administered 2017-12-11: 25 mL
  Filled 2017-12-11: qty 50

## 2017-12-11 MED ORDER — PHENYLEPHRINE HCL 10 MG/ML IJ SOLN
INTRAMUSCULAR | Status: DC | PRN
  Administered 2017-12-11 (×2): 120 ug via INTRAVENOUS
  Administered 2017-12-11 (×2): 80 ug via INTRAVENOUS

## 2017-12-11 MED ORDER — INSULIN ASPART 100 UNIT/ML ~~LOC~~ SOLN
0.0000 [IU] | Freq: Three times a day (TID) | SUBCUTANEOUS | Status: DC
  Administered 2017-12-12: 4 [IU] via SUBCUTANEOUS
  Administered 2017-12-12 (×2): 11 [IU] via SUBCUTANEOUS
  Administered 2017-12-13: 4 [IU] via SUBCUTANEOUS
  Administered 2017-12-14: 11 [IU] via SUBCUTANEOUS
  Administered 2017-12-14: 4 [IU] via SUBCUTANEOUS

## 2017-12-11 MED ORDER — ETOMIDATE 2 MG/ML IV SOLN
INTRAVENOUS | Status: DC | PRN
  Administered 2017-12-11: 14 mg via INTRAVENOUS

## 2017-12-11 MED ORDER — METOPROLOL TARTRATE 12.5 MG HALF TABLET
ORAL_TABLET | ORAL | Status: AC
  Filled 2017-12-11: qty 2

## 2017-12-11 MED ORDER — EPHEDRINE SULFATE 50 MG/ML IJ SOLN
INTRAMUSCULAR | Status: DC | PRN
  Administered 2017-12-11 (×3): 10 mg via INTRAVENOUS

## 2017-12-11 MED ORDER — DEXAMETHASONE SODIUM PHOSPHATE 10 MG/ML IJ SOLN
INTRAMUSCULAR | Status: AC
  Filled 2017-12-11: qty 1

## 2017-12-11 MED ORDER — DEXTROSE 5 % IV SOLN
1.5000 g | INTRAVENOUS | Status: AC
  Filled 2017-12-11 (×2): qty 1.5

## 2017-12-11 MED ORDER — ONDANSETRON HCL 4 MG/2ML IJ SOLN
INTRAMUSCULAR | Status: AC
  Filled 2017-12-11: qty 2

## 2017-12-11 MED ORDER — ALBUMIN HUMAN 5 % IV SOLN
INTRAVENOUS | Status: DC | PRN
  Administered 2017-12-11: 13:00:00 via INTRAVENOUS

## 2017-12-11 MED ORDER — INSULIN DETEMIR 100 UNIT/ML ~~LOC~~ SOLN
10.0000 [IU] | Freq: Two times a day (BID) | SUBCUTANEOUS | Status: DC
  Administered 2017-12-11 – 2017-12-12 (×2): 10 [IU] via SUBCUTANEOUS
  Filled 2017-12-11 (×2): qty 0.1

## 2017-12-11 MED ORDER — DEXTROSE-NACL 5-0.9 % IV SOLN
INTRAVENOUS | Status: DC
  Administered 2017-12-11 (×2): via INTRAVENOUS

## 2017-12-11 MED ORDER — 0.9 % SODIUM CHLORIDE (POUR BTL) OPTIME
TOPICAL | Status: DC | PRN
  Administered 2017-12-11: 1000 mL

## 2017-12-11 MED ORDER — ETOMIDATE 2 MG/ML IV SOLN
INTRAVENOUS | Status: AC
  Filled 2017-12-11: qty 10

## 2017-12-11 SURGICAL SUPPLY — 31 items
APL SKNCLS STERI-STRIP NONHPOA (GAUZE/BANDAGES/DRESSINGS) ×1
BENZOIN TINCTURE PRP APPL 2/3 (GAUZE/BANDAGES/DRESSINGS) ×2 IMPLANT
CANISTER SUCT 3000ML PPV (MISCELLANEOUS) ×3 IMPLANT
DRSG AQUACEL AG ADV 3.5X14 (GAUZE/BANDAGES/DRESSINGS) ×1 IMPLANT
DRSG PAD ABDOMINAL 8X10 ST (GAUZE/BANDAGES/DRESSINGS) ×1 IMPLANT
ELECT REM PT RETURN 9FT ADLT (ELECTROSURGICAL) ×3
ELECTRODE REM PT RTRN 9FT ADLT (ELECTROSURGICAL) ×1 IMPLANT
EVACUATOR SILICONE 100CC (DRAIN) ×2 IMPLANT
GAUZE SPONGE 4X4 12PLY STRL (GAUZE/BANDAGES/DRESSINGS) ×3 IMPLANT
GAUZE SPONGE 4X4 12PLY STRL LF (GAUZE/BANDAGES/DRESSINGS) ×2 IMPLANT
GLOVE BIO SURGEON STRL SZ7.5 (GLOVE) ×2 IMPLANT
GOWN BRE IMP PREV XXLGXLNG (GOWN DISPOSABLE) ×3 IMPLANT
GOWN STRL REUS W/ TWL LRG LVL3 (GOWN DISPOSABLE) ×2 IMPLANT
GOWN STRL REUS W/TWL LRG LVL3 (GOWN DISPOSABLE) ×9
HANDPIECE INTERPULSE COAX TIP (DISPOSABLE) ×3
KIT BASIN OR (CUSTOM PROCEDURE TRAY) ×3 IMPLANT
KIT ROOM TURNOVER OR (KITS) ×3 IMPLANT
NS IRRIG 1000ML POUR BTL (IV SOLUTION) ×3 IMPLANT
PACK GENERAL/GYN (CUSTOM PROCEDURE TRAY) ×3 IMPLANT
PAD ARMBOARD 7.5X6 YLW CONV (MISCELLANEOUS) ×6 IMPLANT
SET HNDPC FAN SPRY TIP SCT (DISPOSABLE) IMPLANT
SUT ETHILON 3 0 FSL (SUTURE) ×2 IMPLANT
SUT VIC AB 2-0 CT1 18 (SUTURE) ×2 IMPLANT
SUT VIC AB 3-0 SH 8-18 (SUTURE) ×2 IMPLANT
SWAB COLLECTION DEVICE MRSA (MISCELLANEOUS) ×3 IMPLANT
SWAB CULTURE ESWAB REG 1ML (MISCELLANEOUS) ×3 IMPLANT
SYR CONTROL 10ML LL (SYRINGE) ×2 IMPLANT
TAPE CLOTH SURG 4X10 WHT LF (GAUZE/BANDAGES/DRESSINGS) ×2 IMPLANT
TAPE STRIPS DRAPE STRL (GAUZE/BANDAGES/DRESSINGS) ×2 IMPLANT
TOWEL GREEN STERILE (TOWEL DISPOSABLE) ×3 IMPLANT
WATER STERILE IRR 1000ML POUR (IV SOLUTION) ×3 IMPLANT

## 2017-12-11 NOTE — Progress Notes (Signed)
Spoke to [place] from [employer]. States she will come to interrogate patient's ICD.

## 2017-12-11 NOTE — Progress Notes (Signed)
Multiple calls to [employer], first to page rep., then more calls made to coordinate care for the pt.'s ICD. Understood that the magnet will be used, communicated the same with  Rep. Daleen Snook.

## 2017-12-11 NOTE — Anesthesia Procedure Notes (Addendum)
Procedure Name: LMA Insertion Date/Time: 12/11/2017 12:17 PM Performed by: Izora Gala, CRNA Pre-anesthesia Checklist: Patient identified, Emergency Drugs available, Suction available, Patient being monitored and Timeout performed Patient Re-evaluated:Patient Re-evaluated prior to induction Oxygen Delivery Method: Circle system utilized Preoxygenation: Pre-oxygenation with 100% oxygen Induction Type: IV induction Ventilation: Mask ventilation without difficulty LMA: LMA inserted LMA Size: 5.0 Number of attempts: 1 Tube secured with: Tape Dental Injury: Teeth and Oropharynx as per pre-operative assessment

## 2017-12-11 NOTE — Progress Notes (Signed)
Pre Procedure note for inpatients:   Victor [name] has been scheduled for Procedure(s): IRRIGATION AND DEBRIDEMENT OF CHEST HEMATOMA (N/A) today. The various methods of treatment have been discussed with the patient. After consideration of the risks, benefits and treatment options the patient has consented to the planned procedure.   The patient has been seen and labs reviewed. There are no changes in the patient's condition to prevent proceeding with the planned procedure today.  Recent labs:  Lab Results  Component Value Date   WBC 10.6 (H) 12/11/2017   HGB 10.3 (L) 12/11/2017   HCT 32.1 (L) 12/11/2017   PLT 237 12/11/2017   GLUCOSE 104 (H) 12/11/2017   CHOL 127 06/29/2015   TRIG 111 06/29/2015   HDL 36 (L) 06/29/2015   LDLCALC 69 06/29/2015   ALT 33 12/06/2017   AST 50 (H) 12/06/2017   NA 137 12/11/2017   K 3.8 12/11/2017   CL 96 (L) 12/11/2017   CREATININE 1.24 12/11/2017   BUN 16 12/11/2017   CO2 29 12/11/2017   TSH 0.411 12/07/2017   INR 1.26 12/06/2017   HGBA1C 8.8 (H) 12/08/2017    Len Childs, MD 12/11/2017 7:28 AM

## 2017-12-11 NOTE — Care Management (Signed)
Benefit check submitted for Eliquis 12/11/17

## 2017-12-11 NOTE — Anesthesia Preprocedure Evaluation (Addendum)
Anesthesia Evaluation  Patient identified by MRN, date of birth, ID band Patient awake    Reviewed: Allergy & Precautions, NPO status , Patient's Chart, lab work & pertinent test results  History of Anesthesia Complications Negative for: history of anesthetic complications  Airway Mallampati: II  TM Distance: >3 FB Neck ROM: Full    Dental no notable dental hx. (+) Dental Advisory Given   Pulmonary COPD,  oxygen dependent, former smoker,    Pulmonary exam normal        Cardiovascular hypertension, + CAD, + Past MI, + Peripheral Vascular Disease and +CHF  Normal cardiovascular exam+ Cardiac Defibrillator    Impressions:  - Moderate to severe LV systolic dysfunction with diffuse   hypokinesis, akinesis of the basal and mid inferior and   inferolateral walls and paradoxical septal motion. LVEF 30-35%.   RVEF normal, moderate pulmonary hypertension.    Neuro/Psych TIACVA negative psych ROS   GI/Hepatic Neg liver ROS, GERD  ,  Endo/Other  diabetes  Renal/GU Renal InsufficiencyRenal disease     Musculoskeletal   Abdominal   Peds  Hematology   Anesthesia Other Findings   Reproductive/Obstetrics                            Anesthesia Physical Anesthesia Plan  ASA: IV  Anesthesia Plan: General   Post-op Pain Management:    Induction: Intravenous  PONV Risk Score and Plan: 2 and Ondansetron and Dexamethasone  Airway Management Planned: LMA  Additional Equipment:   Intra-op Plan:   Post-operative Plan: Extubation in OR  Informed Consent: I have reviewed the patients History and Physical, chart, labs and discussed the procedure including the risks, benefits and alternatives for the proposed anesthesia with the patient or authorized representative who has indicated his/her understanding and acceptance.   Dental advisory given  Plan Discussed with: CRNA, Anesthesiologist and  Surgeon  Anesthesia Plan Comments:         Anesthesia Quick Evaluation

## 2017-12-11 NOTE — Brief Op Note (Signed)
12/03/2017 - 12/11/2017  1:35 PM  PATIENT:  Victor [name]  82 y.o. male  PRE-OPERATIVE DIAGNOSIS:  Left rectus sheath hematoma  nearold AICD pocket  POST-OPERATIVE DIAGNOSIS: same- rectus sheath hematoma  Near old AICD pocket  PROCEDURE:  Procedure(s): IRRIGATION AND DEBRIDEMENT OF CHEST HEMATOMA (N/A)  SURGEON:  Surgeon(s) and Role:    Ivin Poot, MD - Primary  PHYSICIAN ASSISTANT: 0  ASSISTANTS: none   ANESTHESIA:   general  EBL:  5 mL   BLOOD ADMINISTERED:none  DRAINS: (15 F) Jackson-Pratt drain(s) with closed bulb suction in the rectus hematoma space  LOCAL MEDICATIONS USED:  LIDOCAINE  and Amount: 5 ml  SPECIMEN:  Aspirate  DISPOSITION OF SPECIMEN:  microbiology  COUNTS:  YES  TOURNIQUET:  * No tourniquets in log *  DICTATION: .Dragon Dictation  PLAN OF CARE: return to 2 C bed  PATIENT DISPOSITION:  PACU - hemodynamically stable.   Delay start of Pharmacological VTE agent (>24hrs) due to surgical blood loss or risk of bleeding: yes

## 2017-12-11 NOTE — Progress Notes (Signed)
[facility] Consultation Progress Note  Patient: Victor [name] JME:[id number]   PCP: Cyndi Bender, PA-C DOB: May 01, 1936   DOA: 12/03/2017   DOS: 12/11/2017   Date of Service: the patient was seen and examined on 12/11/2017 Primary service: [place], Peter M, MD   Subjective: Denies any acute complaint.  Chest wall pain is better.  More lethargic as compared to yesterday.  Had an episode of hypoglycemia this morning.  Brief hospital course: Victor [name] is a 82 y.o. male with Past medical history of CAD S/P CABG, ischemic cardiomyopathy with chronic combined CHF, chronic kidney disease stage III, arthritis, HTN, chronic respiratory failure on 2 L of oxygen, CVA, PVD, type II DM on chronic insulin. Patient is coming from [facility]. Patient presented with complaints of chest pain prior to be musculoskeletal without any benefit from nitroglycerin.  CT PE in the other facility was negative for dissection or PE.  Patient was transferred to [facility] for further workup for his chest pain as well as the finding of fluid surrounding epicardial pacer. During the stay in the hospital patient continues to have chest pain as well as diffuse abdominal pain.  Interventional radiology as well as cardio thoracic surgery was consulted for fluid collection. Patient underwent ultrasound-guided fluid aspiration on 12/05/2017 30 mL fluid brown thick was removed.  Still large amount of fluid remains in the pocket which will require surgical removal. Patient's Cymbalta was increased from 30 mg daily to 60 mg daily.  50 mg 3 times daily as needed, last dose was on 9.11 on 12/06/2017.  Zanaflex was also started last dose was on 11.22 on 12/06/2017. On 12/06/2017 after receiving Zanaflex and tramadol the patient become more lethargic and sleepy and drowsy.  Later during the day the patient was not able to stay awake.  Rapid response was called for further assistance.  Hospitalist was called for further  management of acute encephalopathy. At the time of my evaluation rapid response nurse was at bedside, patient was drowsy and lethargic but arousable on verbal command.  Nonverbal following commands and falling asleep during the conversation.  Blood glucose was 138, blood pressure was 76/42 without any tachycardia. Transferred to ICU on 12/06/2017, started on levo fed and IV fluid with improvement in mentation as well as blood pressure.  Currently further plan is continue supportive care, DC cardioversion as well as removal of the pocket wall hematoma on Monday.  Assessment and Plan: 1.  respiratory distress Acute hypoxic respiratory failure Acute on chronic systolic CHF Likely combination for acute on chronic systolic CHF and bronchitis xopenex added. CHF exacerbation in last 48 hours due to tachycardia Received diuresis in beginning of the stay with worsening renal function and needed fluids, now back on lasix.  Management of CHF per cardiology   2. acute kidney injury on chronic kidney disease stage III. Renal function on admission was showing GFR 55, serum creatinine 1.21.  On 12/04/2017 it was 1.51. Creatinine almost back to baseline. Adequate urine output. Likely prerenal etiology with over diuresis.  There is a potential for it to further worsen due to hypotensive episode yesterday with possible ATN. Continue to Avoid nephrotoxic medications. Lasix and Cozaar has been on hold since 12/06/2017 Received gentle hydration until 12/08/2017.   Lasix starting on 12/08/2017.  Monitor Renal function with reintroduction of diuresis.   3.  Type 2 diabetes mellitus. Uncontrolled with hyperglycemia and hypoglycemia Patient's home dose 30 units of Lantus. Dose was reduced to Lantus to 15  units with sensitive sliding scale every 4 hours. Now that the patient is more awake, will advance the sliding scale. Skipped his dinner therefore become hypoglycemic in night Yesterday was on 20 units of  long-acting insulin, sensitive sliding scale as well as 3 units of meal coverage.  The patient is n.p.o. after midnight, had an episode of hypoglycemia this morning.  We will start on D5 at 30 cc/h and transition to sensitive sliding scale every 4 hours per hour. Switch back to 20 units of Lantus with sensitive sliding scale and 3 units of meal coverage once patient is taking oral.  4. Acute metabolic encephalopathy. Now resolved toxic in the setting of polypharmacy. Patient received increased dose of Cymbalta, Lyrica, new medication tramadol as well as Zanaflex. Remained lethargic throughout the day on 12/06/2017.  Significant improvement on 12/07/2017,  Asterixis is currently resolved since 12/08/2017. ABG shows no hypercarbia mild hypoxia. Patient received IV Narcan without any significant benefit, also received 1 dose of IV glucagon for beta-blocker reversal. Continue to avoid psychotropic medications.  5.  Leukocytosis. Epicardial system pocket wall hematoma. Rectus sheath hematoma. Suspected epicardial pocket infection. Chest wall pain Hb appears relatively stable. Will monitor. Ultrasound-guided aspiration was performed which shows no organisms so far. Blood cultures are also negative. Infectious disease consulted recommend no antibiotic right now. Cardio thoracic surgery was consulted, patient may require surgical removal of all the fluid. With rectal temp of 102 patient was empirically started on IV antibiotics with suspicion for sepsis. Significant improvement overnight with supportive measures, less likely sepsis.  Discontinue antibiotics.  Blood culture negative till now. Pain control with k pad and lidocaine patch. Avoid psychotropic meds, no more asterixis.   5. ST elevation on EKG H/O CAD Management per cardiology,.  EKG changes not consistent with STEMI  6.  Goals of care discussion. Patient wanted to change his CODE STATUS from full code to DNR/DNI. Patient's daughter  was at bedside as well as son was aware of this decision per family. Patient was also given a copy of MOST form to review.  7.  A. fib with RVR. Management per cardiology. Plan is to perform cardioversion on Monday. On heparin for anticoagulation  Bowel regimen: last BM 12/06/2016 Diet: advance ad tolerated to cardiac and carb modified diet DVT Prophylaxis: subcutaneous Heparin  Family Communication: family was present at bedside, at the time of interview.   Disposition: We will continue to follow the patient.  Family informed me that they don't think that they have enough help at home to manage patient's care.  Recommendation on discharge: Pending further improvement  Other Consultants: ID, CCM Procedures:  Antibiotics: Anti-infectives (From admission, onward)   Start     Dose/Rate Route Frequency Ordered Stop   12/11/17 1030  vancomycin (VANCOCIN) 1,000 mg in sodium chloride 0.9 % 1,000 mL irrigation      Irrigation To Surgery 12/10/17 1616 12/12/17 1030   12/11/17 0900  cefUROXime (ZINACEF) injection 1.5 g  Status:  Discontinued     1.5 g Intravenous To ShortStay Surgical 12/10/17 1111 12/11/17 0409   12/11/17 0900  [MAR Hold]  cefUROXime (ZINACEF) 1.5 g in dextrose 5 % 50 mL IVPB     (MAR Hold since 12/11/17 1039)   1.5 g 100 mL/hr over 30 Minutes Intravenous To Short Stay 12/11/17 0409 12/12/17 0900   12/07/17 1830  vancomycin (VANCOCIN) IVPB 1000 mg/200 mL premix  Status:  Discontinued     1,000 mg 200 mL/hr over 60 Minutes Intravenous Every  24 hours 12/06/17 1843 12/07/17 0917   12/07/17 0400  piperacillin-tazobactam (ZOSYN) IVPB 3.375 g  Status:  Discontinued     3.375 g 12.5 mL/hr over 240 Minutes Intravenous Every 8 hours 12/06/17 1843 12/07/17 0917   12/06/17 1900  piperacillin-tazobactam (ZOSYN) IVPB 3.375 g     3.375 g 100 mL/hr over 30 Minutes Intravenous  Once 12/06/17 1839 12/06/17 2030   12/06/17 1900  vancomycin (VANCOCIN) IVPB 1000 mg/200 mL premix      1,000 mg 200 mL/hr over 60 Minutes Intravenous  Once 12/06/17 1839 12/06/17 2140      Objective: Physical Exam: Vitals:   12/10/17 2339 12/11/17 0321 12/11/17 0440 12/11/17 0855  BP: 98/64 102/63  (!) 121/58  Pulse: (!) 111   92  Resp: (!) 21 20  (!) 22  Temp: 98.3 F (36.8 C) 97.7 F (36.5 C)  97.9 F (36.6 C)  TempSrc: Oral Oral  Oral  SpO2: 100%   97%  Weight:   71.2 kg (156 lb 15.5 oz)   Height:        Intake/Output Summary (Last 24 hours) at 12/11/2017 1251 Last data filed at 12/11/2017 1030 Gross per 24 hour  Intake 600.83 ml  Output 750 ml  Net -149.17 ml   Filed Weights   12/06/17 0357 12/10/17 0646 12/11/17 0440  Weight: 69.2 kg (152 lb 8 oz) 71.5 kg (157 lb 10.1 oz) 71.2 kg (156 lb 15.5 oz)   General: Alert, Awake and Oriented to Time, Place and Person. Appear in moderate distress, affect appropriate, joking with staff, although does not remember events of yesterday. Eyes: PERRL, Conjunctiva normal ENT: Oral Mucosa clear dry. Neck: difficult to assess JVD, no Abnormal Mass Or lumps Cardiovascular: S1 and S2 Present, no Murmur, Peripheral Pulses Present Respiratory: normal respiratory effort, Bilateral Air entry equal and Decreased, no use of accessory muscle, Clear to Auscultation, no Crackles, no wheezes Abdomen: Bowel Sound present, Soft and no tenderness, no hernia Skin: no redness, no Rash, no induration Extremities: no Pedal edema, no calf tenderness Neurologic: Grossly no focal neuro deficit. Bilaterally Equal motor strength Asterixis resolved  Data Reviewed: CBC: Recent Labs  Lab 12/06/17 1806 12/07/17 0237 12/08/17 0407 12/10/17 0321 12/11/17 0305  WBC 15.0* 17.4* 12.1* 11.2* 10.6*  NEUTROABS 12.6* 13.7*  --   --   --   HGB 10.3* 11.1* 10.2* 9.7* 10.3*  HCT 30.5* 34.3* 31.0* 30.4* 32.1*  MCV 83.1 84.1 82.7 84.0 84.9  PLT 158 170 145* 182 638   Basic Metabolic Panel: Recent Labs  Lab 12/07/17 0237 12/08/17 0407 12/09/17 1111  12/10/17 0321 12/11/17 0305  NA 137 133* 130* 135 137  K 3.9 3.7 4.0 3.7 3.8  CL 101 97* 93* 96* 96*  CO2 22 24 24 27 29   GLUCOSE 114* 309* 485* 148* 104*  BUN 57* 43* 21* 17 16  CREATININE 2.51* 1.62* 1.29* 1.13 1.24  CALCIUM 8.3* 8.2* 8.1* 8.5* 8.5*  MG 2.1  --  1.8  --   --    Liver Function Tests: Recent Labs  Lab 12/06/17 1806  AST 50*  ALT 33  ALKPHOS 99  BILITOT 0.6  PROT 5.3*  ALBUMIN 2.3*   No results for input(s): LIPASE, AMYLASE in the last 168 hours. Recent Labs  Lab 12/06/17 1806  AMMONIA 13   Cardiac Enzymes: Recent Labs  Lab 12/06/17 1806 12/06/17 2334 12/07/17 0237 12/07/17 1106 12/07/17 1828  CKTOTAL 20*  --   --   --   --  CKMB 1.0  --   --   --   --   TROPONINI 0.13* 0.10* 0.39* 0.08* 0.07*   BNP (last 3 results) Recent Labs    07/16/17 2041 09/05/17 1434 11/08/17 0928  BNP 1,436.0* 1,195.5* 796.6*   CBG: Recent Labs  Lab 12/10/17 2019 12/10/17 2124 12/11/17 0854 12/11/17 0937 12/11/17 1159  GLUCAP 62* 185* 56* 98 94   Recent Results (from the past 240 hour(s))  Culture, blood (Routine X 2) w Reflex to ID Panel     Status: None   Collection Time: 12/04/17 12:40 PM  Result Value Ref Range Status   Specimen Description BLOOD RIGHT ANTECUBITAL  Final   Special Requests IN PEDIATRIC BOTTLE Blood Culture adequate volume  Final   Culture NO GROWTH 5 DAYS  Final   Report Status 12/09/2017 FINAL  Final  Culture, blood (Routine X 2) w Reflex to ID Panel     Status: None   Collection Time: 12/04/17 12:44 PM  Result Value Ref Range Status   Specimen Description BLOOD RIGHT HAND  Final   Special Requests IN PEDIATRIC BOTTLE Blood Culture adequate volume  Final   Culture NO GROWTH 5 DAYS  Final   Report Status 12/09/2017 FINAL  Final  Body fluid culture     Status: None   Collection Time: 12/05/17  3:35 PM  Result Value Ref Range Status   Specimen Description FLUID ABDOMEN  Final   Special Requests Normal  Final   Gram Stain NO  WBC SEEN NO ORGANISMS SEEN   Final   Culture No growth aerobically or anaerobically.  Final   Report Status 12/09/2017 FINAL  Final  Culture, blood (x 2)     Status: None (Preliminary result)   Collection Time: 12/06/17  8:21 PM  Result Value Ref Range Status   Specimen Description BLOOD HAND LEFT  Final   Special Requests IN PEDIATRIC BOTTLE Blood Culture adequate volume  Final   Culture NO GROWTH 4 DAYS  Final   Report Status PENDING  Incomplete  Culture, blood (x 2)     Status: None (Preliminary result)   Collection Time: 12/06/17  8:22 PM  Result Value Ref Range Status   Specimen Description BLOOD ARM RIGHT  Final   Special Requests IN PEDIATRIC BOTTLE Blood Culture adequate volume  Final   Culture NO GROWTH 4 DAYS  Final   Report Status PENDING  Incomplete  MRSA PCR Screening     Status: None   Collection Time: 12/06/17  8:50 PM  Result Value Ref Range Status   MRSA by PCR NEGATIVE NEGATIVE Final    Comment:        The GeneXpert MRSA Assay (FDA approved for NASAL specimens only), is one component of a comprehensive MRSA colonization surveillance program. It is not intended to diagnose MRSA infection nor to guide or monitor treatment for MRSA infections.   Culture, Urine     Status: None   Collection Time: 12/06/17  9:43 PM  Result Value Ref Range Status   Specimen Description URINE, RANDOM  Final   Special Requests NONE  Final   Culture NO GROWTH  Final   Report Status 12/08/2017 FINAL  Final    Studies: No results found.   Scheduled Meds: . [MAR Hold] amiodarone  200 mg Oral BID  . [MAR Hold] furosemide  80 mg Oral Daily  . [MAR Hold] insulin aspart  0-9 Units Subcutaneous Q4H  . [MAR Hold] lidocaine  2 patch Transdermal Q24H  .  metoprolol tartrate      . [MAR Hold] metoprolol tartrate  25 mg Oral QID  . [MAR Hold] pravastatin  40 mg Oral q1800  . vancomycin 1000 mg in NS (1000 ml) irrigation for Dr. Roxy Manns case   Irrigation To OR    Continuous Infusions: .  [MAR Hold] cefUROXime (ZINACEF)  IV    . dextrose    . dextrose 5 % and 0.9% NaCl 30 mL/hr at 12/11/17 0945   PRN Meds: [MAR Hold] acetaminophen, [MAR Hold]  HYDROmorphone (DILAUDID) injection, [MAR Hold] levalbuterol, [MAR Hold] nitroGLYCERIN, [MAR Hold] ondansetron (ZOFRAN) IV, [MAR Hold] oxyCODONE, [MAR Hold] traZODone  Time spent: 35 minutes  Author: Berle Mull, MD [facility] Pager: [phone] 12/11/2017 12:51 PM  If 7PM-7AM, please contact night-coverage at [email or web address], password [facility]

## 2017-12-11 NOTE — Transfer of Care (Signed)
Immediate Anesthesia Transfer of Care Note  Patient: Victor [name]  Procedure(s) Performed: IRRIGATION AND DEBRIDEMENT OF CHEST HEMATOMA (N/A )  Patient Location: PACU  Anesthesia Type:General  Level of Consciousness: awake, alert , oriented and drowsy  Airway & Oxygen Therapy: Patient Spontanous Breathing and Patient connected to nasal cannula oxygen  Post-op Assessment: Report given to RN, Post -op Vital signs reviewed and stable, Patient moving all extremities and Patient moving all extremities X 4  Post vital signs: Reviewed and stable  Last Vitals:  Vitals:   12/11/17 0321 12/11/17 0855  BP: 102/63 (!) 121/58  Pulse:  92  Resp: 20 (!) 22  Temp: 36.5 C 36.6 C  SpO2:  97%    Last Pain:  Vitals:   12/11/17 0855  TempSrc: Oral  PainSc:       Patients Stated Pain Goal: 0 (58/09/98 3382)  Complications: No apparent anesthesia complications

## 2017-12-11 NOTE — Progress Notes (Signed)
PT Cancellation Note  Patient Details Name: CASS VANDERMEULEN MRN: [id number] DOB: [birth year]   Cancelled Treatment:    Reason Eval/Treat Not Completed: Patient at procedure or test/unavailable. Pt leaving floor for procedure. Will follow-up for PT treatment tomorrow.  Mabeline Caras, PT, DPT Acute Rehab Services  Pager: [place] 12/11/2017, 10:32 AM

## 2017-12-11 NOTE — Progress Notes (Signed)
OT Cancellation Note  Patient Details Name: Victor [name] MRN: [id number] DOB: Oct 20, 1936   Cancelled Treatment:    Reason Eval/Treat Not Completed: Patient at procedure or test/ unavailable.  Will reattempt.  Ketra Duchesne Altha, OTR/L [phone]   Lucille Passy M 12/11/2017, 10:49 AM

## 2017-12-11 NOTE — Care Management (Addendum)
.   ELIQUIS 2.5 MG BID  COVER- YES  CO-PAY- $ 40.00 Q/L TWO PILL PER DAY  TIER- 3 DRUG  PRIOR APPROVAL- NO   2. ELIQUIS 50 MG BID  COVER- YES  CO-PAY- $ 40.00  Q/L 1 PILL PER DAY  TIER- 3 DRUG  PRIOR APPROVAL- NO   PREFERRED PHARMACY : [facility] AND [facility]

## 2017-12-11 NOTE — Anesthesia Postprocedure Evaluation (Signed)
Anesthesia Post Note  Patient: Victor [name]  Procedure(s) Performed: IRRIGATION AND DEBRIDEMENT OF CHEST HEMATOMA (N/A )     Patient location during evaluation: PACU Anesthesia Type: General Level of consciousness: sedated Pain management: pain level controlled Vital Signs Assessment: post-procedure vital signs reviewed and stable Respiratory status: spontaneous breathing and respiratory function stable Cardiovascular status: stable Postop Assessment: no apparent nausea or vomiting Anesthetic complications: no    Last Vitals:  Vitals:   12/11/17 1519 12/11/17 1523  BP: 92/62 92/62  Pulse: 75 83  Resp: 18 20  Temp: 36.4 C   SpO2: 96% 97%    Last Pain:  Vitals:   12/11/17 1523  TempSrc:   PainSc: 0-No pain                 Keila Turan DANIEL

## 2017-12-12 ENCOUNTER — Encounter (HOSPITAL_COMMUNITY): Payer: Self-pay | Admitting: *Deleted

## 2017-12-12 LAB — BASIC METABOLIC PANEL
ANION GAP: 12 (ref 5–15)
BUN: 19 mg/dL (ref 6–20)
CHLORIDE: 98 mmol/L — AB (ref 101–111)
CO2: 23 mmol/L (ref 22–32)
Calcium: 8.1 mg/dL — ABNORMAL LOW (ref 8.9–10.3)
Creatinine, Ser: 1.19 mg/dL (ref 0.61–1.24)
GFR calc non Af Amer: 56 mL/min — ABNORMAL LOW (ref >60)
GLUCOSE: 322 mg/dL — AB (ref 65–99)
POTASSIUM: 5 mmol/L (ref 3.5–5.1)
Sodium: 133 mmol/L — ABNORMAL LOW (ref 135–145)

## 2017-12-12 LAB — GLUCOSE, CAPILLARY
GLUCOSE-CAPILLARY: 292 mg/dL — AB (ref 65–99)
GLUCOSE-CAPILLARY: 174 mg/dL — AB (ref 65–99)
GLUCOSE-CAPILLARY: 245 mg/dL — AB (ref 65–99)
Glucose-Capillary: 318 mg/dL — ABNORMAL HIGH (ref 65–99)
Glucose-Capillary: 260 mg/dL — ABNORMAL HIGH (ref 65–99)

## 2017-12-12 LAB — CBC
HEMATOCRIT: 29.2 % — AB (ref 39.0–52.0)
HEMOGLOBIN: 9.1 g/dL — AB (ref 13.0–17.0)
MCH: 26.4 pg (ref 26.0–34.0)
MCHC: 31.2 g/dL (ref 30.0–36.0)
MCV: 84.6 fL (ref 78.0–100.0)
Platelets: 206 10*3/uL (ref 150–400)
RBC: 3.45 MIL/uL — ABNORMAL LOW (ref 4.22–5.81)
RDW: 14.5 % (ref 11.5–15.5)
WBC: 7.8 10*3/uL (ref 4.0–10.5)

## 2017-12-12 MED ORDER — INSULIN DETEMIR 100 UNIT/ML ~~LOC~~ SOLN
10.0000 [IU] | Freq: Two times a day (BID) | SUBCUTANEOUS | Status: AC
  Administered 2017-12-13: 10 [IU] via SUBCUTANEOUS
  Filled 2017-12-12 (×2): qty 0.1

## 2017-12-12 MED ORDER — CEFUROXIME SODIUM 1.5 G IV SOLR
1.5000 g | Freq: Two times a day (BID) | INTRAVENOUS | Status: DC
  Filled 2017-12-12: qty 1.5

## 2017-12-12 MED ORDER — INSULIN DETEMIR 100 UNIT/ML ~~LOC~~ SOLN
20.0000 [IU] | Freq: Every day | SUBCUTANEOUS | Status: DC
  Filled 2017-12-12: qty 0.2

## 2017-12-12 MED ORDER — DEXTROSE 5 % IV SOLN
1.5000 g | Freq: Two times a day (BID) | INTRAVENOUS | Status: AC
  Administered 2017-12-12 – 2017-12-15 (×8): 1.5 g via INTRAVENOUS
  Filled 2017-12-12 (×9): qty 1.5

## 2017-12-12 NOTE — Progress Notes (Signed)
Physical Therapy Treatment Patient Details Name: Victor [name] MRN: [id number] DOB: 05-14-1936 Today's Date: 12/12/2017    History of Present Illness Pt is a 82 y.o. male admitted from [facility] on 12/03/17 for workup for chest pain and fluid surrounding epicardial pacer. CT negative for dissection or PE. Underwent ultrasound-guided fluid aspiration 1/15. Rapid response called 1/16 due to decreased responsiveness with hypotension. Pt also with AKI on CKD III. S/p I&D for rectus sheath hematoma on 1/21. PMH includes CHF, DMII, CAD, PVD, COPD, cardiomyopathy s/p ICD placement.    PT Comments    Pt progressing well with mobility. Supervision for safety with all mobility using RW, but does not require any physical assist. Continue to recommend HHPT services to maximize functional mobility, strength, and activity tolerance upon return home. Will benefit from 24/7 supervision from family, but should not require any physical assist. Pt anxious to return home soon. Will follow acutely.   Follow Up Recommendations  Home health PT;Supervision/Assistance - 24 hour     Equipment Recommendations  None recommended by PT    Recommendations for Other Services       Precautions / Restrictions Precautions Precautions: Fall Precaution Comments: JP drain and abdominal incision Restrictions Weight Bearing Restrictions: No    Mobility  Bed Mobility Overal bed mobility: Independent                Transfers Overall transfer level: Needs assistance Equipment used: Rolling walker (2 wheeled) Transfers: Sit to/from Stand Sit to Stand: Supervision            Ambulation/Gait Ambulation/Gait assistance: Supervision Ambulation Distance (Feet): 350 Feet Assistive device: Rolling walker (2 wheeled) Gait Pattern/deviations: Step-through pattern;Decreased stride length;Trunk flexed Gait velocity: Decreased Gait velocity interpretation: <1.8 ft/sec, indicative of risk for recurrent  falls General Gait Details: Slow, controlled ambulation with RW and supervision for safety.    Stairs Stairs: Yes   Stair Management: Two rails   General stair comments: Simulated ascending steps by having pt perform high marching with BUE support (pt has bilat rail support at home); pt able to do so well with supervision for safety  Wheelchair Mobility    Modified Rankin (Stroke Patients Only)       Balance Overall balance assessment: Needs assistance Sitting-balance support: No upper extremity supported;Feet supported Sitting balance-Leahy Scale: Good     Standing balance support: Single extremity supported;During functional activity Standing balance-Leahy Scale: Fair Standing balance comment: Can static stand with no UE support; reliant on UE support for dynamic balance                            Cognition Arousal/Alertness: Awake/alert Behavior During Therapy: WFL for tasks assessed/performed Overall Cognitive Status: Within Functional Limits for tasks assessed                                        Exercises      General Comments        Pertinent Vitals/Pain Pain Assessment: Faces Faces Pain Scale: Hurts little more Pain Location: incision site Pain Descriptors / Indicators: Sore Pain Intervention(s): Monitored during session    Home Living                      Prior Function            PT Goals (current goals  can now be found in the care plan section) Acute Rehab PT Goals Patient Stated Goal: to go home PT Goal Formulation: With patient Time For Goal Achievement: 12/22/17 Potential to Achieve Goals: Good Progress towards PT goals: Progressing toward goals    Frequency    Min 3X/week      PT Plan Current plan remains appropriate    Co-evaluation              AM-PAC PT "6 Clicks" Daily Activity  Outcome Measure  Difficulty turning over in bed (including adjusting bedclothes, sheets and  blankets)?: None Difficulty moving from lying on back to sitting on the side of the bed? : None Difficulty sitting down on and standing up from a chair with arms (e.g., wheelchair, bedside commode, etc,.)?: A Little Help needed moving to and from a bed to chair (including a wheelchair)?: A Little Help needed walking in hospital room?: A Little Help needed climbing 3-5 steps with a railing? : A Little 6 Click Score: 20    End of Session Equipment Utilized During Treatment: Gait belt;Oxygen Activity Tolerance: Patient tolerated treatment well Patient left: in bed;with call bell/phone within reach;with bed alarm set Nurse Communication: Mobility status PT Visit Diagnosis: Other abnormalities of gait and mobility (R26.89)     Time: 3329-5188 PT Time Calculation (min) (ACUTE ONLY): 25 min  Charges:  $Gait Training: 23-37 mins                    G Codes:      Mabeline Caras, PT, DPT Acute Rehab Services  Pager: [place] 12/12/2017, 4:10 PM

## 2017-12-12 NOTE — Progress Notes (Signed)
Progress Note  Patient Name: Victor [name] Date of Encounter: 12/12/2017  Primary Cardiologist:   Peter Martinique, MD   Subjective   Feels well.  No pain.   Inpatient Medications    Scheduled Meds: . amiodarone  200 mg Oral BID  . cefUROXime  1.5 g Intravenous Q12H  . furosemide  80 mg Oral Daily  . insulin aspart  0-20 Units Subcutaneous TID WC  . insulin aspart  0-5 Units Subcutaneous QHS  . insulin detemir  10 Units Subcutaneous BID  . lidocaine  2 patch Transdermal Q24H  . metoprolol tartrate  25 mg Oral QID  . pravastatin  40 mg Oral q1800   Continuous Infusions: . sodium chloride 50 mL/hr at 12/11/17 2046  . cefUROXime (ZINACEF)  IV     PRN Meds: acetaminophen, HYDROmorphone (DILAUDID) injection, levalbuterol, nitroGLYCERIN, ondansetron (ZOFRAN) IV, oxyCODONE, traZODone   Vital Signs    Vitals:   12/11/17 2200 12/12/17 0100 12/12/17 0351 12/12/17 0421  BP: 113/79 115/71 127/60   Pulse: 84 93 86   Resp: (!) 28 (!) 22 18   Temp:   (!) 97.4 F (36.3 C)   TempSrc:   Oral   SpO2: 97% 99% 100%   Weight:    162 lb 11.2 oz (73.8 kg)  Height:        Intake/Output Summary (Last 24 hours) at 12/12/2017 0743 Last data filed at 12/12/2017 0429 Gross per 24 hour  Intake 2257.17 ml  Output 665 ml  Net 1592.17 ml   Filed Weights   12/10/17 0646 12/11/17 0440 12/12/17 0421  Weight: 157 lb 10.1 oz (71.5 kg) 156 lb 15.5 oz (71.2 kg) 162 lb 11.2 oz (73.8 kg)    Telemetry    Atrial fib.  Rare ectopy.  - Personally Reviewed  ECG    NA - Personally Reviewed  Physical Exam   GEN: No acute distress.   Neck: No  JVD Cardiac: Irregular RR, no murmurs, rubs, or gallops.  Respiratory: Clear  to auscultation bilaterally. GI: Soft, nontender, non-distended  MS: No  edema; No deformity. Neuro:  Nonfocal  Psych: Normal affect   Labs    Chemistry Recent Labs  Lab 12/06/17 1806  12/10/17 0321 12/11/17 0305 12/12/17 0330  NA 135   < > 135 137 133*  K 3.6   < >  3.7 3.8 5.0  CL 96*   < > 96* 96* 98*  CO2 26   < > 27 29 23   GLUCOSE 100*   < > 148* 104* 322*  BUN 52*   < > 17 16 19   CREATININE 2.62*   < > 1.13 1.24 1.19  CALCIUM 8.3*   < > 8.5* 8.5* 8.1*  PROT 5.3*  --   --   --   --   ALBUMIN 2.3*  --   --   --   --   AST 50*  --   --   --   --   ALT 33  --   --   --   --   ALKPHOS 99  --   --   --   --   BILITOT 0.6  --   --   --   --   GFRNONAA 22*   < > 60* 54* 56*  GFRAA 25*   < > >60 >60 >60  ANIONGAP 13   < > 12 12 12    < > = values in this interval not displayed.  Hematology Recent Labs  Lab 12/10/17 0321 12/11/17 0305 12/12/17 0330  WBC 11.2* 10.6* 7.8  RBC 3.62* 3.78* 3.45*  HGB 9.7* 10.3* 9.1*  HCT 30.4* 32.1* 29.2*  MCV 84.0 84.9 84.6  MCH 26.8 27.2 26.4  MCHC 31.9 32.1 31.2  RDW 14.1 14.4 14.5  PLT 182 237 206    Cardiac Enzymes Recent Labs  Lab 12/06/17 2334 12/07/17 0237 12/07/17 1106 12/07/17 1828  TROPONINI 0.10* 0.39* 0.08* 0.07*   No results for input(s): TROPIPOC in the last 168 hours.   BNPNo results for input(s): BNP, PROBNP in the last 168 hours.   DDimer No results for input(s): DDIMER in the last 168 hours.   Radiology    No results found.  Cardiac Studies   ECHO 12/07/17  Study Conclusions  - Left ventricle: The cavity size was normal. Systolic function was   moderately to severely reduced. The estimated ejection fraction   was in the range of 30% to 35%. Diffuse hypokinesis. - Ventricular septum: Septal motion showed paradox. - Aortic valve: There was no regurgitation. - Mitral valve: Calcified annulus. Mildly thickened leaflets .   There was mild regurgitation. - Left atrium: The atrium was normal in size. - Right atrium: The atrium was mildly dilated. - Tricuspid valve: There was moderate regurgitation. - Pulmonic valve: There was no regurgitation. - Pulmonary arteries: Systolic pressure was moderately increased.   PA peak pressure: 50 mm Hg (S). - Inferior vena cava: The  vessel was dilated. The respirophasic   diameter changes were blunted (< 50%), consistent with elevated   central venous pressure. - Pericardium, extracardiac: There was no pericardial effusion.  Patient Profile     82 y.o. male with past medical history of CAD, s/p CABG, chronic combined systolic/diastolic congestive heart failure, ischemic cardiomyopathy-last EF 35-40%, history ofVTstatus post ICD, hypertension, diabetes mellitus, chronic stage III kidney disease, and PVDadmitted with continuous chest pain and abdominal pain. CT scan at an outside hospital showed fluid collection around ICD wires.Troponin minimally elevated.   Assessment & Plan    AICD POCKET  HEMATOMA:  This was debrided yesterday.  No organisms seem and cultures pending. Not on antibiotics as no sign of infection.    RECTUS SHEATH HEMATOMA:    Off of Eliquis until OK with surgical team.    ATRIAL FIB:   On oral amiodarone.  Need to resume Eliquis.  Plan is to consider cardioversion in 3 - 4 weeks.  Restart Eliquis when OK with surgery.   CAD:  No acute ischemic symptoms.  Continue medical management.  CKD III:   Creat is improved.     ACUTE ON CHRONIC CHF:  Seems to be euvolemic.   DM:  On scheduled insulin and SSI.     SVT:  No further events.   ACUTE ENCEPHALOPATHY:  Improved and avoiding psychotropic meds.  He has resolved.  OK to transfer to tele.   DNR/DNI:    For questions or updates, please contact [facility] Please consult [email or web address] for contact info under Cardiology/STEMI.   Signed, Minus Breeding, MD  12/12/2017, 7:43 AM

## 2017-12-12 NOTE — Evaluation (Signed)
Occupational Therapy Evaluation Patient Details Name: Victor [name] MRN: [id number] DOB: Oct 16, 1936 Today's Date: 12/12/2017    History of Present Illness Pt is a 82 y.o. male admitted from [facility] on 12/03/17 for workup for chest pain and fluid surrounding epicardial pacer. CT negative for dissection or PE. Underwent ultrasound-guided fluid aspiration 1/15. Rapid response called 1/16 due to decreased responsiveness with hypotension. Pt also with AKI on CKD III. PMH includes CHF, DMII, CAD, PVD, COPD, cardiomyopathy s/p ICD placement. S/P I&D 1/21 for rectus sheath hematoma   Clinical Impression   This 82 yo male admitted with above presents to acute OT with mild decreased balance when up on his feet and mild pain from incisional site, but still affecting his safety and independence with basic ADLs. He will benefit from acute OT without need for follow up--only 24 hour S and prn A from family.    Follow Up Recommendations  No OT follow up;Supervision/Assistance - 24 hour    Equipment Recommendations  None recommended by OT       Precautions / Restrictions Precautions Precautions: Fall Precaution Comments: JP drain and abdominal incision Restrictions Weight Bearing Restrictions: No      Mobility Bed Mobility Overal bed mobility: Modified Independent             General bed mobility comments: increased time due to new abdominal incision  Transfers Overall transfer level: Needs assistance Equipment used: Rolling walker (2 wheeled) Transfers: Sit to/from Stand Sit to Stand: Min guard              Balance Overall balance assessment: Needs assistance Sitting-balance support: No upper extremity supported;Feet supported Sitting balance-Leahy Scale: Good     Standing balance support: Single extremity supported;During functional activity Standing balance-Leahy Scale: Poor Standing balance comment: Can static stand with no UE support; reliant on UE support for  dynamic balance                           ADL either performed or assessed with clinical judgement   ADL Overall ADL's : Needs assistance/impaired Eating/Feeding: Independent;Sitting   Grooming: Min guard;Standing   Upper Body Bathing: Set up;Sitting   Lower Body Bathing: Min guard;Sit to/from stand   Upper Body Dressing : Set up;Sitting   Lower Body Dressing: Min guard;Sit to/from stand   Toilet Transfer: Min guard;Ambulation;Regular Toilet;Grab bars   Toileting- Clothing Manipulation and Hygiene: Min guard;Sit to/from stand         General ADL Comments: I did recommend to pt that he use shower seat to sit on to shower intially for safety and endurance--he verbalized understanding     Vision Patient Visual Report: No change from baseline              Pertinent Vitals/Pain Pain Assessment: 0-10 Pain Score: 2  Pain Location: incisional site Pain Descriptors / Indicators: Sore Pain Intervention(s): Limited activity within patient's tolerance     Hand Dominance Right   Extremity/Trunk Assessment Upper Extremity Assessment Upper Extremity Assessment: Overall WFL for tasks assessed           Communication Communication Communication: No difficulties   Cognition Arousal/Alertness: Awake/alert Behavior During Therapy: WFL for tasks assessed/performed Overall Cognitive Status: Within Functional Limits for tasks assessed  Home Living Family/patient expects to be discharged to:: Private residence Living Arrangements: Other relatives(grandson and his wife) Available Help at Discharge: Family;Available 24 hours/day Type of Home: House Home Access: Stairs to enter [employer] of Steps: 3 Entrance Stairs-Rails: Right;Left Home Layout: One level     Bathroom Shower/Tub: Occupational psychologist: Standard     Home Equipment: Environmental consultant - 2 wheels;Walker - 4  wheels;Bedside commode;Shower seat;Electric scooter   Additional Comments: If pt stays at his own home, family stays with him. Normally he stays at grandson's home with family available for assist      Prior Functioning/Environment Level of Independence: Independent with assistive device(s)        Comments: Mod indep with rollator or SPC inside home. Uses electric scooter for longer distances outside; O2 at 2 liters all the time        OT Problem List: Pain;Impaired balance (sitting and/or standing)      OT Treatment/Interventions: Self-care/ADL training;DME and/or AE instruction;Patient/family education;Balance training    OT Goals(Current goals can be found in the care plan section) Acute Rehab OT Goals Patient Stated Goal: to go home  OT Frequency: Min 2X/week              AM-PAC PT "6 Clicks" Daily Activity     Outcome Measure Help from another person eating meals?: None Help from another person taking care of personal grooming?: A Little Help from another person toileting, which includes using toliet, bedpan, or urinal?: A Little Help from another person bathing (including washing, rinsing, drying)?: A Little Help from another person to put on and taking off regular upper body clothing?: A Little Help from another person to put on and taking off regular lower body clothing?: A Little 6 Click Score: 19   End of Session Equipment Utilized During Treatment: Gait belt;Rolling walker;Oxygen(2 liters) Nurse Communication: (asked RN about chair alarm and she did not feel pt needed one)  Activity Tolerance: Patient tolerated treatment well Patient left: in chair;with call bell/phone within reach  OT Visit Diagnosis: Unsteadiness on feet (R26.81);Pain Pain - part of body: (abdomen--incision)                Time: 0071-2197 OT Time Calculation (min): 30 min Charges:  OT General Charges $OT Visit: 1 Visit OT Evaluation $OT Eval Moderate Complexity: 1 Mod OT  Treatments $Self Care/Home Management : 8-22 mins Golden Circle, OTR/L [phone] 12/12/2017

## 2017-12-12 NOTE — Progress Notes (Addendum)
      [address]       [place] [address]             [phone]      1 Day Post-Op Procedure(s) (LRB): IRRIGATION AND DEBRIDEMENT OF CHEST HEMATOMA (N/A) Subjective: Minor discomfort from surgical site   Objective: Vital signs in last 24 hours: Temp:  [97.4 F (36.3 C)-98 F (36.7 C)] 98 F (36.7 C) (01/22 0712) Pulse Rate:  [75-98] 86 (01/22 0351) Cardiac Rhythm: Atrial fibrillation;Atrial flutter (01/22 0700) Resp:  [13-28] 18 (01/22 0351) BP: (65-129)/(35-91) 127/60 (01/22 0351) SpO2:  [92 %-100 %] 100 % (01/22 0351) Weight:  [162 lb 11.2 oz (73.8 kg)] 162 lb 11.2 oz (73.8 kg) (01/22 0421)  Hemodynamic parameters for last 24 hours:    Intake/Output from previous day: 01/21 0701 - 01/22 0700 In: 2257.2 [P.O.:642; I.V.:1290.2; IV Piggyback:250] Out: 665 [Urine:650; Drains:10; Blood:5] Intake/Output this shift: No intake/output data recorded.  General appearance: alert, cooperative and no distress Heart: regular rate and rhythm Lungs: mildly dim in bases Wound: incis healing well  Lab Results: Recent Labs    12/11/17 0305 12/12/17 0330  WBC 10.6* 7.8  HGB 10.3* 9.1*  HCT 32.1* 29.2*  PLT 237 206   BMET:  Recent Labs    12/11/17 0305 12/12/17 0330  NA 137 133*  K 3.8 5.0  CL 96* 98*  CO2 29 23  GLUCOSE 104* 322*  BUN 16 19  CREATININE 1.24 1.19  CALCIUM 8.5* 8.1*    PT/INR: No results for input(s): LABPROT, INR in the last 72 hours. ABG    Component Value Date/Time   PHART 7.475 (H) 12/06/2017 1730   HCO3 27.4 12/06/2017 1730   TCO2 32 03/20/2015 1638   O2SAT 98.1 12/06/2017 1730   CBG (last 3)  Recent Labs    12/11/17 2011 12/11/17 2302 12/12/17 0346  GLUCAP 507* 411* 318*    Meds Scheduled Meds: . amiodarone  200 mg Oral BID  . furosemide  80 mg Oral Daily  . insulin aspart  0-20 Units Subcutaneous TID WC  . insulin aspart  0-5 Units Subcutaneous QHS  . insulin detemir  10 Units Subcutaneous BID  .  lidocaine  2 patch Transdermal Q24H  . metoprolol tartrate  25 mg Oral QID  . pravastatin  40 mg Oral q1800   Continuous Infusions: . sodium chloride 50 mL/hr at 12/11/17 2046  . cefUROXime (ZINACEF)  IV    . cefUROXime (ZINACEF)  IV     PRN Meds:.acetaminophen, HYDROmorphone (DILAUDID) injection, levalbuterol, nitroGLYCERIN, ondansetron (ZOFRAN) IV, oxyCODONE, traZODone  Xrays No results found.  Assessment/Plan: S/P Procedure(s) (LRB): IRRIGATION AND DEBRIDEMENT OF CHEST HEMATOMA (N/A)  1 stable with dressing CDI, only 5 cc drainage- poss d/c drain soon 2 routine pulm toilet and mobilize as able 3 other care per primary   LOS: 9 days    [name] 12/12/2017 Keep JP drain to suction until am 1/25 to obliterate the encapsulated space No bacteria or WBCs on smear- prob liquified hematoma  patient examined and medical record reviewed,agree with above note. Tharon Aquas Trigt III 12/12/2017

## 2017-12-12 NOTE — Progress Notes (Signed)
[facility] Consultation Progress Note  Patient: Victor [name]   PCP: Cyndi Bender, PA-C DOB: 11-04-1936   DOA: 12/03/2017   DOS: 12/12/2017   Date of Service: the patient was seen and examined on 12/12/2017 Primary service: Martin[place], Peter M, MD   Subjective: Awake, feels back to baseline.  No nausea no vomiting.  No chest pain no abdominal pain.  No fever no chills.  Tolerating oral diet.  Brief hospital course: Victor [name] is a 82 y.o. male with Past medical history of CAD S/P CABG, ischemic cardiomyopathy with chronic combined CHF, chronic kidney disease stage III, arthritis, HTN, chronic respiratory failure on 2 L of oxygen, CVA, PVD, type II DM on chronic insulin. Patient is coming from [facility]. Patient presented with complaints of chest pain prior to be musculoskeletal without any benefit from nitroglycerin.  CT PE in the other facility was negative for dissection or PE.  Patient was transferred to [facility] for further workup for his chest pain as well as the finding of fluid surrounding epicardial pacer. During the stay in the hospital patient continues to have chest pain as well as diffuse abdominal pain.  Interventional radiology as well as cardio thoracic surgery was consulted for fluid collection. Patient underwent ultrasound-guided fluid aspiration on 12/05/2017 30 mL fluid brown thick was removed.  Still large amount of fluid remains in the pocket which will require surgical removal. Patient's Cymbalta was increased from 30 mg daily to 60 mg daily.  50 mg 3 times daily as needed, last dose was on 9.11 on 12/06/2017.  Zanaflex was also started last dose was on 11.22 on 12/06/2017. On 12/06/2017 after receiving Zanaflex and tramadol the patient become more lethargic and sleepy and drowsy.  Later during the day the patient was not able to stay awake.  Rapid response was called for further assistance.  Hospitalist was called for further management of  acute encephalopathy. At the time of my evaluation rapid response nurse was at bedside, patient was drowsy and lethargic but arousable on verbal command.  Nonverbal following commands and falling asleep during the conversation.  Blood glucose was 138, blood pressure was 76/42 without any tachycardia. Transferred to ICU on 12/06/2017, started on levo fed and IV fluid with improvement in mentation as well as blood pressure.  Currently further plan is continue supportive care. Assessment and Plan: 1.  respiratory distress Acute hypoxic respiratory failure Acute on chronic systolic CHF Likely combination for acute on chronic systolic CHF and bronchitis xopenex added. CHF exacerbation in last 48 hours due to tachycardia Received diuresis in beginning of the stay with worsening renal function and needed fluids, now back on lasix.  Management of CHF per cardiology   2. acute kidney injury on chronic kidney disease stage III. Renal function on admission was showing GFR 55, serum creatinine 1.21.  On 12/04/2017 it was 1.51. Creatinine almost back to baseline. Adequate urine output. Likely prerenal etiology with over diuresis.  There is a potential for it to further worsen due to hypotensive episode yesterday with possible ATN. Continue to Avoid nephrotoxic medications. Lasix and Cozaar has been on hold since 12/06/2017 Received gentle hydration until 12/08/2017.   Lasix starting on 12/08/2017.  Monitor Renal function with reintroduction of diuresis.   3.  Type 2 diabetes mellitus. Uncontrolled with hyperglycemia and hypoglycemia Patient's home dose 30 units of Lantus. With patient undergoing procedures, being n.p.o., also skipping voluntarily his dinner it has been difficult to manage patient's sugar. Last  24 hours CBG range from 56 to 507. Patient was given 10 units of Lantus due to anticipation of poor p.o. intake at night. Although the patient appears back to his baseline we will resume his  Lantus 20 units daily, currently on resistant sliding scale although tomorrow would be able to change to moderate sliding scale along with 3 units of pre-meal coverage.  4. Acute metabolic encephalopathy. Now resolved toxic in the setting of polypharmacy. Patient received increased dose of Cymbalta, Lyrica, new medication tramadol as well as Zanaflex. Remained lethargic throughout the day on 12/06/2017.  Significant improvement on 12/07/2017,  Asterixis is currently resolved since 12/08/2017. ABG shows no hypercarbia mild hypoxia. Patient received IV Narcan without any significant benefit, also received 1 dose of IV glucagon for beta-blocker reversal. Continue to avoid psychotropic medications.  5.  Leukocytosis. Epicardial system pocket wall hematoma. Rectus sheath hematoma. Suspected epicardial pocket infection. Chest wall pain Hb appears relatively stable. Will monitor. Ultrasound-guided aspiration was performed which shows no organisms so far. Blood cultures are also negative. Infectious disease consulted recommend no antibiotic right now. Cardio thoracic surgery was consulted, patient may require surgical removal of all the fluid. With rectal temp of 102 patient was empirically started on IV antibiotics with suspicion for sepsis. Significant improvement overnight with supportive measures, less likely sepsis.  Discontinue antibiotics.  Blood culture negative till now. Pain control with k pad and lidocaine patch. Avoid psychotropic meds, no more asterixis.   5. ST elevation on EKG H/O CAD Management per cardiology,.  EKG changes not consistent with STEMI  6.  Goals of care discussion. Patient wanted to change his CODE STATUS from full code to DNR/DNI. Patient's daughter was at bedside as well as son was aware of this decision per family. Patient was also given a copy of MOST form to review.  7.  A. fib with RVR. Management per cardiology. Plan is to perform cardioversion on  Monday. On heparin for anticoagulation  Bowel regimen: last BM 12/07/2016 Diet: advance ad tolerated to cardiac and carb modified diet DVT Prophylaxis: subcutaneous Heparin  Family Communication: no family was present at bedside, at the time of interview.   Disposition: We will continue to follow the patient.  Family informed me that they don't think that they have enough help at home to manage patient's care.  Recommendation on discharge: Pending further improvement  Other Consultants: ID, CCM Procedures:  Antibiotics: Anti-infectives (From admission, onward)   Start     Dose/Rate Route Frequency Ordered Stop   12/12/17 1000  cefUROXime (ZINACEF) injection 1.5 g  Status:  Discontinued     1.5 g Intravenous Every 12 hours 12/12/17 0734 12/12/17 0747   12/12/17 1000  cefUROXime (ZINACEF) 1.5 g in dextrose 5 % 50 mL IVPB     1.5 g 100 mL/hr over 30 Minutes Intravenous Every 12 hours 12/12/17 0748     12/11/17 1030  vancomycin (VANCOCIN) 1,000 mg in sodium chloride 0.9 % 1,000 mL irrigation  Status:  Discontinued      Irrigation To Surgery 12/10/17 1616 12/11/17 1541   12/11/17 0900  cefUROXime (ZINACEF) injection 1.5 g  Status:  Discontinued     1.5 g Intravenous To ShortStay Surgical 12/10/17 1111 12/11/17 0409   12/11/17 0900  cefUROXime (ZINACEF) 1.5 g in dextrose 5 % 50 mL IVPB     1.5 g 100 mL/hr over 30 Minutes Intravenous To Short Stay 12/11/17 0409 12/12/17 0900   12/07/17 1830  vancomycin (VANCOCIN) IVPB 1000 mg/200 mL  premix  Status:  Discontinued     1,000 mg 200 mL/hr over 60 Minutes Intravenous Every 24 hours 12/06/17 1843 12/07/17 0917   12/07/17 0400  piperacillin-tazobactam (ZOSYN) IVPB 3.375 g  Status:  Discontinued     3.375 g 12.5 mL/hr over 240 Minutes Intravenous Every 8 hours 12/06/17 1843 12/07/17 0917   12/06/17 1900  piperacillin-tazobactam (ZOSYN) IVPB 3.375 g     3.375 g 100 mL/hr over 30 Minutes Intravenous  Once 12/06/17 1839 12/06/17 2030    12/06/17 1900  vancomycin (VANCOCIN) IVPB 1000 mg/200 mL premix     1,000 mg 200 mL/hr over 60 Minutes Intravenous  Once 12/06/17 1839 12/06/17 2140      Objective: Physical Exam: Vitals:   12/12/17 0351 12/12/17 0421 12/12/17 0712 12/12/17 0713  BP: 127/60   127/69  Pulse: 86   88  Resp: 18   (!) 23  Temp: (!) 97.4 F (36.3 C)  98 F (36.7 C)   TempSrc: Oral  Oral   SpO2: 100%   97%  Weight:  73.8 kg (162 lb 11.2 oz)    Height:        Intake/Output Summary (Last 24 hours) at 12/12/2017 1223 Last data filed at 12/12/2017 0429 Gross per 24 hour  Intake 2234.67 ml  Output 540 ml  Net 1694.67 ml   Filed Weights   12/10/17 0646 12/11/17 0440 12/12/17 0421  Weight: 71.5 kg (157 lb 10.1 oz) 71.2 kg (156 lb 15.5 oz) 73.8 kg (162 lb 11.2 oz)   General: Alert, Awake and Oriented to Time, Place and Person. Appear in moderate distress, affect appropriate, joking with staff, although does not remember events of yesterday. Eyes: PERRL, Conjunctiva normal ENT: Oral Mucosa clear dry. Neck: difficult to assess JVD, no Abnormal Mass Or lumps Cardiovascular: S1 and S2 Present, no Murmur, Peripheral Pulses Present Respiratory: normal respiratory effort, Bilateral Air entry equal and Decreased, no use of accessory muscle, Clear to Auscultation, no Crackles, no wheezes Abdomen: Bowel Sound present, Soft and no tenderness, no hernia Skin: no redness, no Rash, no induration Extremities: no Pedal edema, no calf tenderness Neurologic: Grossly no focal neuro deficit. Bilaterally Equal motor strength Asterixis resolved  Data Reviewed: CBC: Recent Labs  Lab 12/06/17 1806 12/07/17 0237 12/08/17 0407 12/10/17 0321 12/11/17 0305 12/12/17 0330  WBC 15.0* 17.4* 12.1* 11.2* 10.6* 7.8  NEUTROABS 12.6* 13.7*  --   --   --   --   HGB 10.3* 11.1* 10.2* 9.7* 10.3* 9.1*  HCT 30.5* 34.3* 31.0* 30.4* 32.1* 29.2*  MCV 83.1 84.1 82.7 84.0 84.9 84.6  PLT 158 170 145* 182 237 098   Basic Metabolic  Panel: Recent Labs  Lab 12/07/17 0237 12/08/17 0407 12/09/17 1111 12/10/17 0321 12/11/17 0305 12/12/17 0330  NA 137 133* 130* 135 137 133*  K 3.9 3.7 4.0 3.7 3.8 5.0  CL 101 97* 93* 96* 96* 98*  CO2 22 24 24 27 29 23   GLUCOSE 114* 309* 485* 148* 104* 322*  BUN 57* 43* 21* 17 16 19   CREATININE 2.51* 1.62* 1.29* 1.13 1.24 1.19  CALCIUM 8.3* 8.2* 8.1* 8.5* 8.5* 8.1*  MG 2.1  --  1.8  --   --   --    Liver Function Tests: Recent Labs  Lab 12/06/17 1806  AST 50*  ALT 33  ALKPHOS 99  BILITOT 0.6  PROT 5.3*  ALBUMIN 2.3*   No results for input(s): LIPASE, AMYLASE in the last 168 hours. Recent Labs  Lab  12/06/17 1806  AMMONIA 13   Cardiac Enzymes: Recent Labs  Lab 12/06/17 1806 12/06/17 2334 12/07/17 0237 12/07/17 1106 12/07/17 1828  CKTOTAL 20*  --   --   --   --   CKMB 1.0  --   --   --   --   TROPONINI 0.13* 0.10* 0.39* 0.08* 0.07*   BNP (last 3 results) Recent Labs    07/16/17 2041 09/05/17 1434 11/08/17 0928  BNP 1,436.0* 1,195.5* 796.6*   CBG: Recent Labs  Lab 12/11/17 2003 12/11/17 2011 12/11/17 2302 12/12/17 0346 12/12/17 0756  GLUCAP 501* 507* 411* 318* 260*   Recent Results (from the past 240 hour(s))  Culture, blood (Routine X 2) w Reflex to ID Panel     Status: None   Collection Time: 12/04/17 12:40 PM  Result Value Ref Range Status   Specimen Description BLOOD RIGHT ANTECUBITAL  Final   Special Requests IN PEDIATRIC BOTTLE Blood Culture adequate volume  Final   Culture NO GROWTH 5 DAYS  Final   Report Status 12/09/2017 FINAL  Final  Culture, blood (Routine X 2) w Reflex to ID Panel     Status: None   Collection Time: 12/04/17 12:44 PM  Result Value Ref Range Status   Specimen Description BLOOD RIGHT HAND  Final   Special Requests IN PEDIATRIC BOTTLE Blood Culture adequate volume  Final   Culture NO GROWTH 5 DAYS  Final   Report Status 12/09/2017 FINAL  Final  Body fluid culture     Status: None   Collection Time: 12/05/17  3:35 PM   Result Value Ref Range Status   Specimen Description FLUID ABDOMEN  Final   Special Requests Normal  Final   Gram Stain NO WBC SEEN NO ORGANISMS SEEN   Final   Culture No growth aerobically or anaerobically.  Final   Report Status 12/09/2017 FINAL  Final  Culture, blood (x 2)     Status: None   Collection Time: 12/06/17  8:21 PM  Result Value Ref Range Status   Specimen Description BLOOD HAND LEFT  Final   Special Requests IN PEDIATRIC BOTTLE Blood Culture adequate volume  Final   Culture NO GROWTH 5 DAYS  Final   Report Status 12/11/2017 FINAL  Final  Culture, blood (x 2)     Status: None   Collection Time: 12/06/17  8:22 PM  Result Value Ref Range Status   Specimen Description BLOOD ARM RIGHT  Final   Special Requests IN PEDIATRIC BOTTLE Blood Culture adequate volume  Final   Culture NO GROWTH 5 DAYS  Final   Report Status 12/11/2017 FINAL  Final  MRSA PCR Screening     Status: None   Collection Time: 12/06/17  8:50 PM  Result Value Ref Range Status   MRSA by PCR NEGATIVE NEGATIVE Final    Comment:        The GeneXpert MRSA Assay (FDA approved for NASAL specimens only), is one component of a comprehensive MRSA colonization surveillance program. It is not intended to diagnose MRSA infection nor to guide or monitor treatment for MRSA infections.   Culture, Urine     Status: None   Collection Time: 12/06/17  9:43 PM  Result Value Ref Range Status   Specimen Description URINE, RANDOM  Final   Special Requests NONE  Final   Culture NO GROWTH  Final   Report Status 12/08/2017 FINAL  Final  Aerobic Culture (superficial specimen)     Status: None (Preliminary result)  Collection Time: 12/11/17 12:47 PM  Result Value Ref Range Status   Specimen Description WOUND LEFT RECTUS HEMATOMA  Final   Special Requests PATIENT ON FOLLOWING ZINACEF  Final   Gram Stain NO WBC SEEN NO ORGANISMS SEEN   Final   Culture PENDING  Incomplete   Report Status PENDING  Incomplete      Studies: No results found.   Scheduled Meds: . amiodarone  200 mg Oral BID  . furosemide  80 mg Oral Daily  . insulin aspart  0-20 Units Subcutaneous TID WC  . insulin aspart  0-5 Units Subcutaneous QHS  . insulin detemir  10 Units Subcutaneous BID  . [START ON 12/13/2017] insulin detemir  20 Units Subcutaneous Daily  . lidocaine  2 patch Transdermal Q24H  . metoprolol tartrate  25 mg Oral QID  . pravastatin  40 mg Oral q1800    Continuous Infusions: . sodium chloride 50 mL/hr at 12/11/17 2046  . cefUROXime (ZINACEF)  IV Stopped (12/12/17 1042)   PRN Meds: acetaminophen, HYDROmorphone (DILAUDID) injection, levalbuterol, nitroGLYCERIN, ondansetron (ZOFRAN) IV, oxyCODONE, traZODone  Time spent: 35 minutes  Author: Berle Mull, MD [facility] Pager: [phone] 12/12/2017 12:23 PM  If 7PM-7AM, please contact night-coverage at [email or web address], password [facility]

## 2017-12-12 NOTE — Progress Notes (Signed)
Inpatient Diabetes Program Recommendations  AACE/ADA: New Consensus Statement on Inpatient Glycemic Control (2015)  Target Ranges:  Prepandial:   less than 140 mg/dL      Peak postprandial:   less than 180 mg/dL (1-2 hours)      Critically ill patients:  140 - 180 mg/dL   Lab Results  Component Value Date   GLUCAP 318 (H) 12/12/2017   HGBA1C 8.8 (H) 12/08/2017    Review of Glycemic Control  Inpatient Diabetes Program Recommendations:   -Add Novolog 3 units tid meal coverage if eats 50% -Decrease Novolog correction to moderate tid + hs  Thank you, Nani Gasser. Cherish Runde, RN, MSN, CDE  Diabetes Coordinator Inpatient Glycemic Control Team Team Pager [phone] (8am-5pm) 12/12/2017 8:19 AM

## 2017-12-12 NOTE — Progress Notes (Signed)
Pt transferred from 2C07 to 3East.  Pt arrived at apprx 0656.  VS completed.  Cardiac monitor placed and CCMD notified with 2 verifiers.

## 2017-12-13 LAB — CBC
HEMATOCRIT: 30.8 % — AB (ref 39.0–52.0)
HEMOGLOBIN: 9.6 g/dL — AB (ref 13.0–17.0)
MCH: 26.5 pg (ref 26.0–34.0)
MCHC: 31.2 g/dL (ref 30.0–36.0)
MCV: 85.1 fL (ref 78.0–100.0)
Platelets: 301 10*3/uL (ref 150–400)
RBC: 3.62 MIL/uL — AB (ref 4.22–5.81)
RDW: 14.3 % (ref 11.5–15.5)
WBC: 14.4 10*3/uL — ABNORMAL HIGH (ref 4.0–10.5)

## 2017-12-13 LAB — BASIC METABOLIC PANEL
ANION GAP: 10 (ref 5–15)
BUN: 21 mg/dL — ABNORMAL HIGH (ref 6–20)
CALCIUM: 8.5 mg/dL — AB (ref 8.9–10.3)
CHLORIDE: 102 mmol/L (ref 101–111)
CO2: 29 mmol/L (ref 22–32)
Creatinine, Ser: 1.25 mg/dL — ABNORMAL HIGH (ref 0.61–1.24)
GFR calc non Af Amer: 53 mL/min — ABNORMAL LOW (ref >60)
GLUCOSE: 73 mg/dL (ref 65–99)
POTASSIUM: 4 mmol/L (ref 3.5–5.1)
Sodium: 141 mmol/L (ref 135–145)

## 2017-12-13 LAB — GLUCOSE, CAPILLARY
GLUCOSE-CAPILLARY: 501 mg/dL — AB (ref 65–99)
GLUCOSE-CAPILLARY: 121 mg/dL — AB (ref 65–99)
GLUCOSE-CAPILLARY: 170 mg/dL — AB (ref 65–99)
GLUCOSE-CAPILLARY: 122 mg/dL — AB (ref 65–99)
GLUCOSE-CAPILLARY: 188 mg/dL — AB (ref 65–99)
Glucose-Capillary: 205 mg/dL — ABNORMAL HIGH (ref 65–99)
Glucose-Capillary: 55 mg/dL — ABNORMAL LOW (ref 65–99)
Glucose-Capillary: 57 mg/dL — ABNORMAL LOW (ref 65–99)

## 2017-12-13 MED ORDER — FUROSEMIDE 40 MG PO TABS
40.0000 mg | ORAL_TABLET | Freq: Every day | ORAL | Status: DC
  Administered 2017-12-13 – 2017-12-15 (×3): 40 mg via ORAL
  Filled 2017-12-13 (×3): qty 1

## 2017-12-13 NOTE — Consult Note (Signed)
   [facility] CM Inpatient Consult   12/13/2017  LINDBERG ZENON [birth year] [id number]  Follow up:  Met with the patient at the bedside.  Patient states his wife passed away and now his grandson [name] helps him out and Josh's wife.  Patient lives in [place] but [name] lives in [place] only 6 miles away but limits his services.  Patient's main concern is his daughter who is in [place] is in serious medical condition and he is wanting to see her.  He states he needs the small portable oxygen to take on the plane but concern he will have difficulty getting it due to regulations.  Encouraged patient to speak with his oxygen supplier for details and qualifications.  Patient states, "I don't know what all will be needed when I get ready to go home but I will take the information you have."  Patient given a brochure, 24 hour nurse advise line and contact information.  States he will look over it and call if he needs more than home health can do.  For questions, please contact:   Natividad Brood, RN BSN [facility] Liaison  [phone] business mobile phone Toll free office [phone]

## 2017-12-13 NOTE — Progress Notes (Signed)
Occupational Therapy Treatment Patient Details Name: Victor [name] MRN: [id number] DOB: September 30, 1936 Today's Date: 12/13/2017    History of present illness Pt is a 82 y.o. male admitted from [facility] on 12/03/17 for workup for chest pain and fluid surrounding epicardial pacer. CT negative for dissection or PE. Underwent ultrasound-guided fluid aspiration 1/15. Rapid response called 1/16 due to decreased responsiveness with hypotension. Pt also with AKI on CKD III. S/p I&D for rectus sheath hematoma on 1/21. PMH includes CHF, DMII, CAD, PVD, COPD, cardiomyopathy s/p ICD placement.    OT comments  Pt performed toileting, standing grooming and dressing with supervision for safety. Pt eager to go home.  Follow Up Recommendations  No OT follow up;Supervision/Assistance - 24 hour    Equipment Recommendations  None recommended by OT    Recommendations for Other Services      Precautions / Restrictions Precautions Precautions: Fall Precaution Comments: JP drain and abdominal incision       Mobility Bed Mobility Overal bed mobility: Independent                Transfers Overall transfer level: Needs assistance   Transfers: Sit to/from Stand Sit to Stand: Supervision              Balance Overall balance assessment: Needs assistance   Sitting balance-Leahy Scale: Good       Standing balance-Leahy Scale: Fair                             ADL either performed or assessed with clinical judgement   ADL Overall ADL's : Needs assistance/impaired     Grooming: Wash/dry hands;Wash/dry face;Oral care;Brushing hair;Standing;Supervision/safety           Upper Body Dressing : Set up;Sitting   Lower Body Dressing: Supervision/safety;Sit to/from stand   Toilet Transfer: Supervision/safety;Ambulation           Functional mobility during ADLs: Supervision/safety       Vision       Perception     Praxis      Cognition Arousal/Alertness:  Awake/alert Behavior During Therapy: WFL for tasks assessed/performed Overall Cognitive Status: Within Functional Limits for tasks assessed                                          Exercises     Shoulder Instructions       General Comments      Pertinent Vitals/ Pain       Pain Assessment: No/denies pain  Home Living                                          Prior Functioning/Environment              Frequency  Min 2X/week        Progress Toward Goals  OT Goals(current goals can now be found in the care plan section)  Progress towards OT goals: Progressing toward goals  Acute Rehab OT Goals Patient Stated Goal: to go home  Plan Discharge plan remains appropriate    Co-evaluation                 AM-PAC PT "6 Clicks" Daily Activity     Outcome Measure  Help from another person eating meals?: None Help from another person taking care of personal grooming?: A Little Help from another person toileting, which includes using toliet, bedpan, or urinal?: A Little Help from another person bathing (including washing, rinsing, drying)?: A Little Help from another person to put on and taking off regular upper body clothing?: None Help from another person to put on and taking off regular lower body clothing?: A Little 6 Click Score: 20    End of Session Equipment Utilized During Treatment: Gait belt;Oxygen  OT Visit Diagnosis: Unsteadiness on feet (R26.81);Pain   Activity Tolerance Patient tolerated treatment well   Patient Left in bed;with call bell/phone within reach   Nurse Communication          Time: 1254-1310 OT Time Calculation (min): 16 min  Charges: OT General Charges $OT Visit: 1 Visit OT Treatments $Self Care/Home Management : 8-22 mins  12/13/2017 Victor [name], OTR/L Pager: [phone]   [name], [name] 12/13/2017, 1:13 PM

## 2017-12-13 NOTE — Progress Notes (Signed)
Progress Note  Patient Name: Victor [name] Date of Encounter: 12/13/2017  Primary Cardiologist:   Peter Martinique, MD   Subjective   Chest pain early this AM.  Atypical and right sided.  He was aggravated because he found out that he would not be going home.   EKG without acute changes.    Inpatient Medications    Scheduled Meds: . amiodarone  200 mg Oral BID  . furosemide  80 mg Oral Daily  . insulin aspart  0-20 Units Subcutaneous TID WC  . insulin aspart  0-5 Units Subcutaneous QHS  . lidocaine  2 patch Transdermal Q24H  . metoprolol tartrate  25 mg Oral QID  . pravastatin  40 mg Oral q1800   Continuous Infusions: . sodium chloride 50 mL/hr at 12/12/17 1710  . cefUROXime (ZINACEF)  IV Stopped (12/13/17 0038)   PRN Meds: acetaminophen, HYDROmorphone (DILAUDID) injection, levalbuterol, nitroGLYCERIN, ondansetron (ZOFRAN) IV, oxyCODONE, traZODone   Vital Signs    Vitals:   12/13/17 0028 12/13/17 0539 12/13/17 0806 12/13/17 0851  BP: 130/89 (!) 163/69 113/65 (!) 152/66  Pulse:  94 97 79  Resp: 18     Temp: (!) 97.5 F (36.4 C) (!) 97.2 F (36.2 C) (!) 97.4 F (36.3 C)   TempSrc: Oral Oral Oral   SpO2: 100% 100% 99% 100%  Weight:  161 lb (73 kg)    Height:        Intake/Output Summary (Last 24 hours) at 12/13/2017 0902 Last data filed at 12/13/2017 8315 Gross per 24 hour  Intake 2331.66 ml  Output 665 ml  Net 1666.66 ml   Filed Weights   12/12/17 0421 12/12/17 1900 12/13/17 0539  Weight: 162 lb 11.2 oz (73.8 kg) 162 lb 3.2 oz (73.6 kg) 161 lb (73 kg)    Telemetry    Atrial fib with PVCs - Personally Reviewed  ECG    Atrial fib with PVCs.  No acute ST T wave changes - Personally Reviewed  Physical Exam   GEN: No acute distress.   Neck: No  JVD Cardiac: Irregular RR, no murmurs, rubs, or gallops.  Respiratory:  Decreased breath sounds left base. GI: Soft, nontender, non-distended  MS: No  edema; No deformity. Neuro:  Nonfocal  Psych: Normal  affect   Labs    Chemistry Recent Labs  Lab 12/06/17 1806  12/11/17 0305 12/12/17 0330 12/13/17 0639  NA 135   < > 137 133* 141  K 3.6   < > 3.8 5.0 4.0  CL 96*   < > 96* 98* 102  CO2 26   < > 29 23 29   GLUCOSE 100*   < > 104* 322* 73  BUN 52*   < > 16 19 21*  CREATININE 2.62*   < > 1.24 1.19 1.25*  CALCIUM 8.3*   < > 8.5* 8.1* 8.5*  PROT 5.3*  --   --   --   --   ALBUMIN 2.3*  --   --   --   --   AST 50*  --   --   --   --   ALT 33  --   --   --   --   ALKPHOS 99  --   --   --   --   BILITOT 0.6  --   --   --   --   GFRNONAA 22*   < > 54* 56* 53*  GFRAA 25*   < > >60 >60 >  60  ANIONGAP 13   < > 12 12 10    < > = values in this interval not displayed.     Hematology Recent Labs  Lab 12/11/17 0305 12/12/17 0330 12/13/17 0639  WBC 10.6* 7.8 14.4*  RBC 3.78* 3.45* 3.62*  HGB 10.3* 9.1* 9.6*  HCT 32.1* 29.2* 30.8*  MCV 84.9 84.6 85.1  MCH 27.2 26.4 26.5  MCHC 32.1 31.2 31.2  RDW 14.4 14.5 14.3  PLT 237 206 301    Cardiac Enzymes Recent Labs  Lab 12/06/17 2334 12/07/17 0237 12/07/17 1106 12/07/17 1828  TROPONINI 0.10* 0.39* 0.08* 0.07*   No results for input(s): TROPIPOC in the last 168 hours.   BNPNo results for input(s): BNP, PROBNP in the last 168 hours.   DDimer No results for input(s): DDIMER in the last 168 hours.   Radiology    No results found.  Cardiac Studies   ECHO 12/07/17  Study Conclusions  - Left ventricle: The cavity size was normal. Systolic function was moderately to severely reduced. The estimated ejection fraction was in the range of 30% to 35%. Diffuse hypokinesis. - Ventricular septum: Septal motion showed paradox. - Aortic valve: There was no regurgitation. - Mitral valve: Calcified annulus. Mildly thickened leaflets . There was mild regurgitation. - Left atrium: The atrium was normal in size. - Right atrium: The atrium was mildly dilated. - Tricuspid valve: There was moderate regurgitation. - Pulmonic valve:  There was no regurgitation. - Pulmonary arteries: Systolic pressure was moderately increased. PA peak pressure: 50 mm Hg (S). - Inferior vena cava: The vessel was dilated. The respirophasic diameter changes were blunted (<50%), consistent with elevated central venous pressure. - Pericardium, extracardiac: There was no pericardial effusion.    Patient Profile     82 y.o. male with past medical history of CAD, s/p CABG, chronic combined systolic/diastolic congestive heart failure, ischemic cardiomyopathy-last EF 35-40%, history ofVTstatus post ICD, hypertension, diabetes mellitus, chronic stage III kidney disease, and PVDadmitted with continuous chest pain and abdominal pain. CT scan at an outside hospital showed fluid collection around ICD wires.Troponin minimally elevated.   Assessment & Plan    AICD POCKET  HEMATOMA:  This was debrided two days ago.  No organisms thus far.   Drain to stay in place until 1/25.    RECTUS SHEATH HEMATOMA:    Off of Eliquis.  Possibly restart when drain removed.  Surgery to comment.    ATRIAL FIB:   On oral amiodarone.  Plan is to consider cardioversion in 3 - 4 weeks after Eliquis restarted.   CAD:  The patient has no new sypmtoms.  No further cardiovascular testing is indicated. Continue medical management.  CKD III:   Creat is stable .     ACUTE ON CHRONIC CHF:  Seems to be euvolemic. Creat heading up slightly and I will decrease Lasix to 40 mg daily.   DM:  On scheduled insulin and SSI.   Continue current therapy.   IM helping with this and appreciated.  SVT:  No further events.   ACUTE ENCEPHALOPATHY:  Improved and avoiding psychotropic meds.  No change in therapy.   DNR/DNI recorded.     For questions or updates, please contact [place] Please consult [email or web address] for contact info under Cardiology/STEMI.   Signed, Minus Breeding, MD  12/13/2017, 9:02 AM

## 2017-12-13 NOTE — Progress Notes (Addendum)
      [address]       [place] [address]             [phone]      2 Days Post-Op Procedure(s) (LRB): IRRIGATION AND DEBRIDEMENT OF CHEST HEMATOMA (N/A) Subjective:  feels fine, minimal discomfort from surgical site, 25 ml from jp in last 1.5 days  Objective: Vital signs in last 24 hours: Temp:  [97.2 F (36.2 C)-98.2 F (36.8 C)] 97.2 F (36.2 C) (01/23 0539) Pulse Rate:  [64-94] 94 (01/23 0539) Cardiac Rhythm: Atrial fibrillation (01/22 1903) Resp:  [18] 18 (01/23 0028) BP: (130-163)/(47-92) 163/69 (01/23 0539) SpO2:  [95 %-100 %] 100 % (01/23 0539) Weight:  [161 lb (73 kg)-162 lb 3.2 oz (73.6 kg)] 161 lb (73 kg) (01/23 0539)  Hemodynamic parameters for last 24 hours:    Intake/Output from previous day: 01/22 0701 - 01/23 0700 In: 2091.7 [P.O.:480; I.V.:1511.7; IV Piggyback:100] Out: 565 [Urine:550; Drains:15] Intake/Output this shift: No intake/output data recorded.  General appearance: alert, cooperative and no distress Heart: regular rate and rhythm Lungs: min dim in left base dressing CDI , no hematoma  Lab Results: Recent Labs    12/11/17 0305 12/12/17 0330  WBC 10.6* 7.8  HGB 10.3* 9.1*  HCT 32.1* 29.2*  PLT 237 206   BMET:  Recent Labs    12/11/17 0305 12/12/17 0330  NA 137 133*  K 3.8 5.0  CL 96* 98*  CO2 29 23  GLUCOSE 104* 322*  BUN 16 19  CREATININE 1.24 1.19  CALCIUM 8.5* 8.1*    PT/INR: No results for input(s): LABPROT, INR in the last 72 hours. ABG    Component Value Date/Time   PHART 7.475 (H) 12/06/2017 1730   HCO3 27.4 12/06/2017 1730   TCO2 32 03/20/2015 1638   O2SAT 98.1 12/06/2017 1730   CBG (last 3)  Recent Labs    12/12/17 2200 12/13/17 0022 12/13/17 0739  GLUCAP 245* 205* 55*    Meds Scheduled Meds: . amiodarone  200 mg Oral BID  . furosemide  80 mg Oral Daily  . insulin aspart  0-20 Units Subcutaneous TID WC  . insulin aspart  0-5 Units Subcutaneous QHS  . insulin detemir  20  Units Subcutaneous Daily  . lidocaine  2 patch Transdermal Q24H  . metoprolol tartrate  25 mg Oral QID  . pravastatin  40 mg Oral q1800   Continuous Infusions: . sodium chloride 50 mL/hr at 12/12/17 1710  . cefUROXime (ZINACEF)  IV Stopped (12/13/17 0038)   PRN Meds:.acetaminophen, HYDROmorphone (DILAUDID) injection, levalbuterol, nitroGLYCERIN, ondansetron (ZOFRAN) IV, oxyCODONE, traZODone  Xrays No results found.  Assessment/Plan: S/P Procedure(s) (LRB): IRRIGATION AND DEBRIDEMENT OF CHEST HEMATOMA (N/A)  1 doing well. Plan as outlined to keep drain till 1/25 to help obliterate space. Prob be able to start Eliquis with removal of drain - will discuss with surgeon   LOS: 10 days    [name] 12/13/2017 DC drain 1-25 and start eliquis later that pm Cultures remain negative patient examined and medical record reviewed,agree with above note. Tharon Aquas Trigt III 12/13/2017

## 2017-12-13 NOTE — Op Note (Signed)
[name], Victor [name] NO.:  [id number]  MEDICAL RECORD NO.:  [id number]  LOCATION:  [id number]                        FACILITY:  [facility]  PHYSICIAN:  Ivin Poot, M.D.  DATE OF BIRTH:  22-Mar-1936  DATE OF PROCEDURE:  12/11/2017 DATE OF DISCHARGE:                              OPERATIVE REPORT   OPERATIONS: 1. Drainage of left upper abdominal wall subrectus hematoma. 2. Placement of 15-French Jackson-Pratt drain.  SURGEON:  Ivin Poot, MD.  ANESTHESIA:  General with LMA and airway.  PREOPERATIVE DIAGNOSIS:  Trauma to the abdomen with collection of a painful hematoma because of the patient's long-term anticoagulation with Eliquis.  POSTOPERATIVE DIAGNOSIS:  Trauma to the abdomen with collection of a painful hematoma because of the patient's long-term anticoagulation with Eliquis.  DESCRIPTION OF PROCEDURE:  The patient was brought from the preop holding area where informed consent had been obtained and the procedure had been previously discussed in detail with the patient and family. The patient understood the risks and benefits of the procedure.  The patient was placed supine on the operating room table and general anesthesia was induced.  The left chest and upper abdomen were prepped and draped as a sterile field.  A proper time-out was performed.  An incision was made based over the site of the hematoma.  After the rectus sheath was opened, a large amount of chocolate-appearing fluid was removed with some clot.  There was no pus.  Blood actually accumulated at an old pocket for an AICD generator, over 32 years old.  This was irrigated with vancomycin irrigation pulse lavage.  Hemostasis was achieved.  A 15-French Jackson-Pratt drain was then placed into the cavity and brought out through separate incision.  The incision was then closed in layers using Vicryl and sterile dressing was applied. Approximately 200 mL of old blood was removed from this  pocket.  The patient was reversed from anesthesia and taken to the recovery room in stable condition.     Ivin Poot, M.D.     PV/MEDQ  D:  12/13/2017  T:  12/13/2017  Job:  [id number]

## 2017-12-13 NOTE — Progress Notes (Signed)
[facility] Consultation Progress Note   ONEIL [name]   RJJ:[id number] DOB: 10-Mar-1936  DOA: 12/03/2017 PCP: Cyndi Bender, PA-C   Brief Narrative:  Victor [name] is a 82 y.o.malewith Past medical history ofCAD S/P CABG, ischemic cardiomyopathy with chronic combined CHF, chronic kidney disease stage III, arthritis, HTN, chronic respiratory failure on 2 L of oxygen, CVA, PVD, type II DM on chronic insulin. Patient is coming [facility]. Patient presented with complaints of chest pain prior to be musculoskeletal without any benefit from nitroglycerin.CT PE in the other facility was negative for dissection or PE. Patient was transferred to [facility] for further workup for his chest pain as well as the finding of fluid surrounding epicardial pacer. During the stay in the hospital patient continues to have chest pain as well as diffuse abdominal pain.Interventional radiology as well as cardio thoracic surgery was consulted for fluid collection. Patient underwent ultrasound-guided fluid aspiration on 12/05/2017 30 mL fluid brown thick was removed. Still large amount of fluid remains in the pocket which will require surgical removal.  On 12/06/17 patient became lethargic and hospitalist was consulted for acute encephalopathy, felt to be medication related after receiving Zanaflex and Cymbalta, which has already resolved. Now on working on labile glucose.   Subjective: Patient seen and examined, report feeling well. Wants to go home. Patient report that normally his glucose is well controlled at home.   Assessment & Plan: Acute resp failure with hypoxia due to acute on chronic systolic CHF  Cardiology managing CHF  On Diuresis  Seems to be stable   AKI on CKD  Baseline Cr 1-1.2 Cr currently at 1.25 Continue to monitor as patient is on Lasix   DM Type II uncontrolled with labile CBGs This could be multifactorial, stress and poor oral intake  Patient was  hypoglycemic again this morning. Will stop long acting insulin for now. Will monitor 24 hr insulin requirement and adjust Lantus.  Continue SSI and Novolog with meals  A1C 8.8, probably would be ok with oral hypoglycemic agent  Continue to monitor   Epicardial pocket wall hematoma  Rectus sheath hematoma  Chest wall pain  Per CTTS and Gen surgery  Abx per primary   A fib with RVR  Per primary  ? Cardioversion on Monday     Objective: Vitals:   12/13/17 0806 12/13/17 0851 12/13/17 1129 12/13/17 1501  BP: 113/65 (!) 152/66 (!) 104/56   Pulse: 97 79 (!) 106 90  Resp:      Temp: (!) 97.4 F (36.3 C)  97.6 F (36.4 C)   TempSrc: Oral  Oral   SpO2: 99% 100% 100%   Weight:      Height:        Intake/Output Summary (Last 24 hours) at 12/13/2017 1548 Last data filed at 12/13/2017 1418 Gross per 24 hour  Intake 2021.66 ml  Output 1365 ml  Net 656.66 ml   Filed Weights   12/12/17 0421 12/12/17 1900 12/13/17 0539  Weight: 73.8 kg (162 lb 11.2 oz) 73.6 kg (162 lb 3.2 oz) 73 kg (161 lb)    Examination:  General exam: Appears calm and comfortable  Respiratory system: Decrease breath sounds on the left  Cardiovascular system: S1 & S2 heard, Irr Irr. No JVD, murmurs, rubs or gallops Gastrointestinal system: Abdomen is nondistended, soft and nontender Central nervous system: Alert and oriented Extremities: No pedal edema.  Skin: No rashes Psychiatry: Judgement and insight appear normal.     Data Reviewed: I  have personally reviewed following labs and imaging studies  CBC: Recent Labs  Lab 12/06/17 1806 12/07/17 0237 12/08/17 0407 12/10/17 0321 12/11/17 0305 12/12/17 0330 12/13/17 0639  WBC 15.0* 17.4* 12.1* 11.2* 10.6* 7.8 14.4*  NEUTROABS 12.6* 13.7*  --   --   --   --   --   HGB 10.3* 11.1* 10.2* 9.7* 10.3* 9.1* 9.6*  HCT 30.5* 34.3* 31.0* 30.4* 32.1* 29.2* 30.8*  MCV 83.1 84.1 82.7 84.0 84.9 84.6 85.1  PLT 158 170 145* 182 237 206 947   Basic Metabolic  Panel: Recent Labs  Lab 12/07/17 0237  12/09/17 1111 12/10/17 0321 12/11/17 0305 12/12/17 0330 12/13/17 0639  NA 137   < > 130* 135 137 133* 141  K 3.9   < > 4.0 3.7 3.8 5.0 4.0  CL 101   < > 93* 96* 96* 98* 102  CO2 22   < > 24 27 29 23 29   GLUCOSE 114*   < > 485* 148* 104* 322* 73  BUN 57*   < > 21* 17 16 19  21*  CREATININE 2.51*   < > 1.29* 1.13 1.24 1.19 1.25*  CALCIUM 8.3*   < > 8.1* 8.5* 8.5* 8.1* 8.5*  MG 2.1  --  1.8  --   --   --   --    < > = values in this interval not displayed.   GFR: Estimated Creatinine Clearance: 46.4 mL/min (A) (by C-G formula based on SCr of 1.25 mg/dL (H)). Liver Function Tests: Recent Labs  Lab 12/06/17 1806  AST 50*  ALT 33  ALKPHOS 99  BILITOT 0.6  PROT 5.3*  ALBUMIN 2.3*   No results for input(s): LIPASE, AMYLASE in the last 168 hours. Recent Labs  Lab 12/06/17 1806  AMMONIA 13   Coagulation Profile: Recent Labs  Lab 12/06/17 1920  INR 1.26   Cardiac Enzymes: Recent Labs  Lab 12/06/17 1806 12/06/17 2334 12/07/17 0237 12/07/17 1106 12/07/17 1828  CKTOTAL 20*  --   --   --   --   CKMB 1.0  --   --   --   --   TROPONINI 0.13* 0.10* 0.39* 0.08* 0.07*   BNP (last 3 results) No results for input(s): PROBNP in the last 8760 hours. HbA1C: No results for input(s): HGBA1C in the last 72 hours. CBG: Recent Labs  Lab 12/12/17 2200 12/13/17 0022 12/13/17 0739 12/13/17 0818 12/13/17 1132  GLUCAP 245* 205* 55* 121* 170*   Lipid Profile: No results for input(s): CHOL, HDL, LDLCALC, TRIG, CHOLHDL, LDLDIRECT in the last 72 hours. Thyroid Function Tests: No results for input(s): TSH, T4TOTAL, FREET4, T3FREE, THYROIDAB in the last 72 hours. Anemia Panel: No results for input(s): VITAMINB12, FOLATE, FERRITIN, TIBC, IRON, RETICCTPCT in the last 72 hours. Sepsis Labs: Recent Labs  Lab 12/06/17 1806 12/06/17 2006 12/06/17 2334 12/07/17 0237  PROCALCITON  --  1.33  --   --   LATICACIDVEN 1.4  --  1.4 1.4    Recent  Results (from the past 240 hour(s))  Culture, blood (Routine X 2) w Reflex to ID Panel     Status: None   Collection Time: 12/04/17 12:40 PM  Result Value Ref Range Status   Specimen Description BLOOD RIGHT ANTECUBITAL  Final   Special Requests IN PEDIATRIC BOTTLE Blood Culture adequate volume  Final   Culture NO GROWTH 5 DAYS  Final   Report Status 12/09/2017 FINAL  Final  Culture, blood (Routine X 2) w  Reflex to ID Panel     Status: None   Collection Time: 12/04/17 12:44 PM  Result Value Ref Range Status   Specimen Description BLOOD RIGHT HAND  Final   Special Requests IN PEDIATRIC BOTTLE Blood Culture adequate volume  Final   Culture NO GROWTH 5 DAYS  Final   Report Status 12/09/2017 FINAL  Final  Body fluid culture     Status: None   Collection Time: 12/05/17  3:35 PM  Result Value Ref Range Status   Specimen Description FLUID ABDOMEN  Final   Special Requests Normal  Final   Gram Stain NO WBC SEEN NO ORGANISMS SEEN   Final   Culture No growth aerobically or anaerobically.  Final   Report Status 12/09/2017 FINAL  Final  Culture, blood (x 2)     Status: None   Collection Time: 12/06/17  8:21 PM  Result Value Ref Range Status   Specimen Description BLOOD HAND LEFT  Final   Special Requests IN PEDIATRIC BOTTLE Blood Culture adequate volume  Final   Culture NO GROWTH 5 DAYS  Final   Report Status 12/11/2017 FINAL  Final  Culture, blood (x 2)     Status: None   Collection Time: 12/06/17  8:22 PM  Result Value Ref Range Status   Specimen Description BLOOD ARM RIGHT  Final   Special Requests IN PEDIATRIC BOTTLE Blood Culture adequate volume  Final   Culture NO GROWTH 5 DAYS  Final   Report Status 12/11/2017 FINAL  Final  MRSA PCR Screening     Status: None   Collection Time: 12/06/17  8:50 PM  Result Value Ref Range Status   MRSA by PCR NEGATIVE NEGATIVE Final    Comment:        The GeneXpert MRSA Assay (FDA approved for NASAL specimens only), is one component of  a comprehensive MRSA colonization surveillance program. It is not intended to diagnose MRSA infection nor to guide or monitor treatment for MRSA infections.   Culture, Urine     Status: None   Collection Time: 12/06/17  9:43 PM  Result Value Ref Range Status   Specimen Description URINE, RANDOM  Final   Special Requests NONE  Final   Culture NO GROWTH  Final   Report Status 12/08/2017 FINAL  Final  Anaerobic culture     Status: None (Preliminary result)   Collection Time: 12/11/17 12:47 PM  Result Value Ref Range Status   Specimen Description WOUND LEFT RECTUS HEMATOMA  Final   Special Requests PATIENT ON FOLLOWING ZINACEF  Final   Culture CULTURE REINCUBATED FOR BETTER GROWTH  Final   Report Status PENDING  Incomplete  Aerobic Culture (superficial specimen)     Status: None (Preliminary result)   Collection Time: 12/11/17 12:47 PM  Result Value Ref Range Status   Specimen Description WOUND LEFT RECTUS HEMATOMA  Final   Special Requests PATIENT ON FOLLOWING ZINACEF  Final   Gram Stain NO WBC SEEN NO ORGANISMS SEEN   Final   Culture NO GROWTH 2 DAYS  Final   Report Status PENDING  Incomplete      Radiology Studies: No results found.    Scheduled Meds: . amiodarone  200 mg Oral BID  . furosemide  40 mg Oral Daily  . insulin aspart  0-20 Units Subcutaneous TID WC  . insulin aspart  0-5 Units Subcutaneous QHS  . lidocaine  2 patch Transdermal Q24H  . metoprolol tartrate  25 mg Oral QID  . pravastatin  40 mg Oral q1800   Continuous Infusions: . sodium chloride 50 mL/hr at 12/12/17 1710  . cefUROXime (ZINACEF)  IV Stopped (12/13/17 0955)     LOS: 10 days    Time spent: Total of 25 minutes spent with pt, greater than 50% of which was spent in discussion of  treatment, counseling and coordination of care   Chipper Oman, MD Pager: Text Page via [email or web address]   If 7PM-7AM, please contact night-coverage [email or web address] 12/13/2017, 3:49 PM

## 2017-12-13 NOTE — Care Management Important Message (Signed)
Important Message  Patient Details  Name: Victor [name] MRN: [id number] Date of Birth: 1936/03/22   Medicare Important Message Given:  Yes    Reginae Wolfrey 12/13/2017, 11:46 AM

## 2017-12-13 NOTE — Progress Notes (Signed)
Pt reports 4 out of 10 chest pain located on right side around right nipple.  Paged doctor- ok to to EKG.

## 2017-12-13 NOTE — Plan of Care (Signed)
  Health Behavior/Discharge Planning: Ability to manage health-related needs will improve 12/13/2017 0146 - Adequate for Discharge by Tristan Schroeder, RN   Clinical Measurements: Ability to maintain clinical measurements within normal limits will improve 12/13/2017 0146 - Adequate for Discharge by Tristan Schroeder, RN

## 2017-12-14 LAB — AEROBIC CULTURE W GRAM STAIN (SUPERFICIAL SPECIMEN)
Culture: NO GROWTH
Gram Stain: NONE SEEN

## 2017-12-14 LAB — GLUCOSE, CAPILLARY
GLUCOSE-CAPILLARY: 170 mg/dL — AB (ref 65–99)
Glucose-Capillary: 269 mg/dL — ABNORMAL HIGH (ref 65–99)
Glucose-Capillary: 57 mg/dL — ABNORMAL LOW (ref 65–99)
Glucose-Capillary: 42 mg/dL — CL (ref 65–99)
Glucose-Capillary: 80 mg/dL (ref 65–99)
Glucose-Capillary: 255 mg/dL — ABNORMAL HIGH (ref 65–99)

## 2017-12-14 LAB — BPAM RBC
Blood Product Expiration Date: 201901302359
Blood Product Expiration Date: 201902222359
Unit Type and Rh: 9500
Unit Type and Rh: 9500

## 2017-12-14 LAB — BASIC METABOLIC PANEL
Anion gap: 11 (ref 5–15)
BUN: 22 mg/dL — AB (ref 6–20)
CALCIUM: 8.1 mg/dL — AB (ref 8.9–10.3)
CO2: 26 mmol/L (ref 22–32)
CREATININE: 1.17 mg/dL (ref 0.61–1.24)
Chloride: 99 mmol/L — ABNORMAL LOW (ref 101–111)
GFR calc non Af Amer: 57 mL/min — ABNORMAL LOW (ref >60)
Glucose, Bld: 312 mg/dL — ABNORMAL HIGH (ref 65–99)
Potassium: 5.3 mmol/L — ABNORMAL HIGH (ref 3.5–5.1)
SODIUM: 136 mmol/L (ref 135–145)

## 2017-12-14 LAB — TYPE AND SCREEN
ABO/RH(D): O NEG
Antibody Screen: POSITIVE
Donor AG Type: NEGATIVE
Donor AG Type: NEGATIVE
Unit division: 0
Unit division: 0

## 2017-12-14 LAB — CBC
HEMATOCRIT: 30.6 % — AB (ref 39.0–52.0)
Hemoglobin: 9.5 g/dL — ABNORMAL LOW (ref 13.0–17.0)
MCH: 26.8 pg (ref 26.0–34.0)
MCHC: 31 g/dL (ref 30.0–36.0)
MCV: 86.4 fL (ref 78.0–100.0)
PLATELETS: 282 10*3/uL (ref 150–400)
RBC: 3.54 MIL/uL — ABNORMAL LOW (ref 4.22–5.81)
RDW: 14.8 % (ref 11.5–15.5)
WBC: 10.6 10*3/uL — AB (ref 4.0–10.5)

## 2017-12-14 MED ORDER — LINAGLIPTIN 5 MG PO TABS
5.0000 mg | ORAL_TABLET | Freq: Every day | ORAL | Status: DC
  Administered 2017-12-14 – 2017-12-16 (×3): 5 mg via ORAL
  Filled 2017-12-14 (×3): qty 1

## 2017-12-14 MED ORDER — INSULIN GLARGINE 100 UNIT/ML ~~LOC~~ SOLN
10.0000 [IU] | Freq: Every day | SUBCUTANEOUS | Status: DC
  Administered 2017-12-14 – 2017-12-16 (×3): 10 [IU] via SUBCUTANEOUS
  Filled 2017-12-14 (×4): qty 0.1

## 2017-12-14 MED ORDER — INSULIN ASPART 100 UNIT/ML ~~LOC~~ SOLN: 0.0000 [IU] | Freq: Three times a day (TID) | SUBCUTANEOUS | Status: DC

## 2017-12-14 MED ORDER — TRAMADOL HCL 50 MG PO TABS
50.0000 mg | ORAL_TABLET | Freq: Three times a day (TID) | ORAL | Status: DC | PRN
  Administered 2017-12-14 – 2017-12-15 (×3): 50 mg via ORAL
  Filled 2017-12-14 (×3): qty 1

## 2017-12-14 MED ORDER — METHOCARBAMOL 500 MG PO TABS
750.0000 mg | ORAL_TABLET | Freq: Three times a day (TID) | ORAL | Status: DC | PRN
  Administered 2017-12-16: 750 mg via ORAL
  Filled 2017-12-14: qty 2

## 2017-12-14 NOTE — Consult Note (Signed)
[facility] Consultation Progress Note   Victor [name] PPI:[id number] DOB: Apr 20, 1936 DOA: 12/03/2017 PCP: Cyndi Bender, PA-C   LOS: 11 days   Brief Narrative / Interim history: Victor [name] is a 82 y.o.malewith Past medical history ofCAD S/P CABG, ischemic cardiomyopathy with chronic combined CHF, chronic kidney disease stage III, arthritis, HTN, chronic respiratory failure on 2 L of oxygen, CVA, PVD, type II DM on chronic insulin. Patient is coming [facility]. Patient presented with complaints of chest pain prior to be musculoskeletal without any benefit from nitroglycerin.CT PE in the other facility was negative for dissection or PE. Patient was transferred to [facility] for further workup for his chest pain as well as the finding of fluid surrounding epicardial pacer. During the stay in the hospital patient continues to have chest pain as well as diffuse abdominal pain.Interventional radiology as well as cardio thoracic surgery was consulted for fluid collection. Patient underwent ultrasound-guided fluid aspiration on 12/05/2017 30 mL fluid brown thick was removed. Minor amount of fluid remains in the pocket which will require surgical removal.  On 12/06/17 patient became lethargic and hospitalist was consulted for acute encephalopathy, felt to be medication related after receiving Zanaflex and Cymbalta, which has already resolved. Now on working on labile glucose.    Assessment & Plan: Principal Problem:   Abdominal wall fluid collections Active Problems:   Insulin dependent type 2 diabetes mellitus, controlled (HCC)   Cardiomyopathy, ischemic   Automatic implantable cardioverter-defibrillator in situ   Essential hypertension   Hx of CABG 1990   OSA (obstructive sleep apnea)   Stage 3 chronic kidney disease (HCC)   Chest pain   Leukocytosis  Acute resp failure with hypoxia due to acute on chronic systolic CHF  Managed by  Cardiology Continue diuresis Stable with minor SOB, no leg edema  AKI on CKD  Baseline Cr 1-1.2 Cr currently 1.17 Continue IV lasix, monitor  DM Type II uncontrolled with labile CBGs Per 24 hour insulin monitoring patient required 14 units of insulin/day. Will start patient on Lantus 10 units once in the morning and tradjenta 5mg  QD. Continue sliding scale Continue to Monitor   Epicardial pocket wall hematoma  Rectus sheath hematoma  Chest wall pain  Followed by CTTS and Gen surgery Continue abx per primary  A fib with RVR  Management per Primary  Arthritis, neck and back Patient reports sharp neck pain and dull back pain daily, with numbness and tingling in bilateral lower extremities Will start patient on Robaxin Resume tramadol. Discontinue Dilaudid Monitor sedative effects  Subjective: Patient was alert sitting in bed. He had no chest pain, fever, abdominal pain. Stated he is uncomfortable due to neck and back pain.  Objective: Vitals:   12/13/17 1501 12/13/17 2008 12/14/17 0500 12/14/17 1100  BP:  124/61 (!) 122/54   Pulse: 90 78 86   Resp:  20 20   Temp:  97.8 F (36.6 C) (!) 97.5 F (36.4 C)   TempSrc:  Oral Oral   SpO2:  100% 100% 95%  Weight:   73.1 kg (161 lb 3.2 oz)   Height:        Intake/Output Summary (Last 24 hours) at 12/14/2017 1157 Last data filed at 12/14/2017 1100 Gross per 24 hour  Intake 1885 ml  Output 1650 ml  Net 235 ml   Filed Weights   12/12/17 1900 12/13/17 0539 12/14/17 0500  Weight: 73.6 kg (162 lb 3.2 oz) 73 kg (161 lb) 73.1 kg (161 lb  3.2 oz)    Examination:  Constitutional: NAD Eyes:  lids and conjunctivae normal ENMT: Mucous membranes are moist. No oropharyngeal exudates Neck: normal, supple; tenderness on palpation, full ROM Respiratory: clear to auscultation bilaterally, no wheezing, no crackles. Normal respiratory effort. No accessory muscle use.  Cardiovascular: Regular rate and rhythm, no murmurs / rubs / gallops.  No LE edema. 2+ pedal pulses.   Abdomen: no tenderness. Bowel sounds positive.  Musculoskeletal:No joint deformity upper and lower extremities. No contractures. Normal muscle tone.  Skin: no rashes, lesions, ulcers.  Neurologic: CN 2-12 grossly intact. Strength 5/5 in all 4.  Psychiatric: Normal judgment and insight. Alert and oriented x 3. Normal mood.    Data Reviewed: I have independently reviewed following labs and imaging studies   CBC: Recent Labs  Lab 12/10/17 0321 12/11/17 0305 12/12/17 0330 12/13/17 0639 12/14/17 0511  WBC 11.2* 10.6* 7.8 14.4* 10.6*  HGB 9.7* 10.3* 9.1* 9.6* 9.5*  HCT 30.4* 32.1* 29.2* 30.8* 30.6*  MCV 84.0 84.9 84.6 85.1 86.4  PLT 182 237 206 301 676   Basic Metabolic Panel: Recent Labs  Lab 12/09/17 1111 12/10/17 0321 12/11/17 0305 12/12/17 0330 12/13/17 0639 12/14/17 0511  NA 130* 135 137 133* 141 136  K 4.0 3.7 3.8 5.0 4.0 5.3*  CL 93* 96* 96* 98* 102 99*  CO2 24 27 29 23 29 26   GLUCOSE 485* 148* 104* 322* 73 312*  BUN 21* 17 16 19  21* 22*  CREATININE 1.29* 1.13 1.24 1.19 1.25* 1.17  CALCIUM 8.1* 8.5* 8.5* 8.1* 8.5* 8.1*  MG 1.8  --   --   --   --   --    GFR: Estimated Creatinine Clearance: 49.5 mL/min (by C-G formula based on SCr of 1.17 mg/dL). Liver Function Tests: No results for input(s): AST, ALT, ALKPHOS, BILITOT, PROT, ALBUMIN in the last 168 hours. No results for input(s): LIPASE, AMYLASE in the last 168 hours. No results for input(s): AMMONIA in the last 168 hours. Coagulation Profile: No results for input(s): INR, PROTIME in the last 168 hours. Cardiac Enzymes: Recent Labs  Lab 12/07/17 1828  TROPONINI 0.07*   BNP (last 3 results) No results for input(s): PROBNP in the last 8760 hours. HbA1C: No results for input(s): HGBA1C in the last 72 hours. CBG: Recent Labs  Lab 12/13/17 1559 12/13/17 1808 12/13/17 2102 12/14/17 0727 12/14/17 1117  GLUCAP 57* 122* 188* 269* 170*   Lipid Profile: No results for  input(s): CHOL, HDL, LDLCALC, TRIG, CHOLHDL, LDLDIRECT in the last 72 hours. Thyroid Function Tests: No results for input(s): TSH, T4TOTAL, FREET4, T3FREE, THYROIDAB in the last 72 hours. Anemia Panel: No results for input(s): VITAMINB12, FOLATE, FERRITIN, TIBC, IRON, RETICCTPCT in the last 72 hours. Urine analysis:    Component Value Date/Time   COLORURINE YELLOW 12/05/2017 1200   APPEARANCEUR HAZY (A) 12/05/2017 1200   LABSPEC 1.012 12/05/2017 1200   PHURINE 5.0 12/05/2017 1200   GLUCOSEU 150 (A) 12/05/2017 1200   HGBUR NEGATIVE 12/05/2017 1200   BILIRUBINUR NEGATIVE 12/05/2017 1200   KETONESUR NEGATIVE 12/05/2017 1200   PROTEINUR 30 (A) 12/05/2017 1200   NITRITE NEGATIVE 12/05/2017 1200   LEUKOCYTESUR NEGATIVE 12/05/2017 1200   Sepsis Labs: Invalid input(s): PROCALCITONIN, LACTICIDVEN  Recent Results (from the past 240 hour(s))  Culture, blood (Routine X 2) w Reflex to ID Panel     Status: None   Collection Time: 12/04/17 12:40 PM  Result Value Ref Range Status   Specimen Description BLOOD RIGHT ANTECUBITAL  Final   Special Requests IN PEDIATRIC BOTTLE Blood Culture adequate volume  Final   Culture NO GROWTH 5 DAYS  Final   Report Status 12/09/2017 FINAL  Final  Culture, blood (Routine X 2) w Reflex to ID Panel     Status: None   Collection Time: 12/04/17 12:44 PM  Result Value Ref Range Status   Specimen Description BLOOD RIGHT HAND  Final   Special Requests IN PEDIATRIC BOTTLE Blood Culture adequate volume  Final   Culture NO GROWTH 5 DAYS  Final   Report Status 12/09/2017 FINAL  Final  Body fluid culture     Status: None   Collection Time: 12/05/17  3:35 PM  Result Value Ref Range Status   Specimen Description FLUID ABDOMEN  Final   Special Requests Normal  Final   Gram Stain NO WBC SEEN NO ORGANISMS SEEN   Final   Culture No growth aerobically or anaerobically.  Final   Report Status 12/09/2017 FINAL  Final  Culture, blood (x 2)     Status: None   Collection  Time: 12/06/17  8:21 PM  Result Value Ref Range Status   Specimen Description BLOOD HAND LEFT  Final   Special Requests IN PEDIATRIC BOTTLE Blood Culture adequate volume  Final   Culture NO GROWTH 5 DAYS  Final   Report Status 12/11/2017 FINAL  Final  Culture, blood (x 2)     Status: None   Collection Time: 12/06/17  8:22 PM  Result Value Ref Range Status   Specimen Description BLOOD ARM RIGHT  Final   Special Requests IN PEDIATRIC BOTTLE Blood Culture adequate volume  Final   Culture NO GROWTH 5 DAYS  Final   Report Status 12/11/2017 FINAL  Final  MRSA PCR Screening     Status: None   Collection Time: 12/06/17  8:50 PM  Result Value Ref Range Status   MRSA by PCR NEGATIVE NEGATIVE Final    Comment:        The GeneXpert MRSA Assay (FDA approved for NASAL specimens only), is one component of a comprehensive MRSA colonization surveillance program. It is not intended to diagnose MRSA infection nor to guide or monitor treatment for MRSA infections.   Culture, Urine     Status: None   Collection Time: 12/06/17  9:43 PM  Result Value Ref Range Status   Specimen Description URINE, RANDOM  Final   Special Requests NONE  Final   Culture NO GROWTH  Final   Report Status 12/08/2017 FINAL  Final  Anaerobic culture     Status: None (Preliminary result)   Collection Time: 12/11/17 12:47 PM  Result Value Ref Range Status   Specimen Description WOUND LEFT RECTUS HEMATOMA  Final   Special Requests PATIENT ON FOLLOWING ZINACEF  Final   Culture   Final    NO ANAEROBES ISOLATED; CULTURE IN PROGRESS FOR 5 DAYS   Report Status PENDING  Incomplete  Aerobic Culture (superficial specimen)     Status: None   Collection Time: 12/11/17 12:47 PM  Result Value Ref Range Status   Specimen Description WOUND LEFT RECTUS HEMATOMA  Final   Special Requests PATIENT ON FOLLOWING ZINACEF  Final   Gram Stain NO WBC SEEN NO ORGANISMS SEEN   Final   Culture NO GROWTH 3 DAYS  Final   Report Status  12/14/2017 FINAL  Final      Radiology Studies: No results found.   Scheduled Meds: . amiodarone  200 mg Oral BID  .  furosemide  40 mg Oral Daily  . insulin aspart  0-20 Units Subcutaneous TID WC  . insulin glargine  10 Units Subcutaneous Daily  . lidocaine  2 patch Transdermal Q24H  . linagliptin  5 mg Oral Daily  . metoprolol tartrate  25 mg Oral QID  . pravastatin  40 mg Oral q1800   Continuous Infusions: . sodium chloride 50 mL/hr at 12/14/17 1100  . cefUROXime (ZINACEF)  IV 1.5 g (12/14/17 1100)       Time spent:   Haleema Vanderheyden, PA-S

## 2017-12-14 NOTE — Progress Notes (Signed)
Physical Therapy Treatment Patient Details Name: Victor [name] MRN: [id number] DOB: March 19, 1936 Today's Date: 12/14/2017    History of Present Illness Pt is a 82 y.o. male admitted from [facility] on 12/03/17 for workup for chest pain and fluid surrounding epicardial pacer. CT negative for dissection or PE. Underwent ultrasound-guided fluid aspiration 1/15. Rapid response called 1/16 due to decreased responsiveness with hypotension. Pt also with AKI on CKD III. S/p I&D for rectus sheath hematoma on 1/21. PMH includes CHF, DMII, CAD, PVD, COPD, cardiomyopathy s/p ICD placement.     PT Comments    Pt performed gait training but  Limited due to neck and upper back pain.  Plan for stair training next session as he has only simulated this activity.  Pt resting in bed post session.      Follow Up Recommendations  Home health PT;Supervision/Assistance - 24 hour     Equipment Recommendations  None recommended by PT    Recommendations for Other Services       Precautions / Restrictions Precautions Precautions: Fall Precaution Comments: JP drain and abdominal incision Restrictions Weight Bearing Restrictions: No    Mobility  Bed Mobility Overal bed mobility: Independent             General bed mobility comments: No assistance needed   Transfers Overall transfer level: Modified independent Equipment used: Rolling walker (2 wheeled) Transfers: Sit to/from Stand Sit to Stand: Modified independent (Device/Increase time)         General transfer comment: No assistance needed and good technique observed.    Ambulation/Gait Ambulation/Gait assistance: Supervision Ambulation Distance (Feet): 160 Feet Assistive device: Rolling walker (2 wheeled) Gait Pattern/deviations: Step-through pattern;Decreased stride length;Trunk flexed Gait velocity: Decreased   General Gait Details: Slow, controlled ambulation with RW and supervision for safety. Cues for forward gaze.      Stairs            Wheelchair Mobility    Modified Rankin (Stroke Patients Only)       Balance Overall balance assessment: Needs assistance   Sitting balance-Leahy Scale: Good       Standing balance-Leahy Scale: Fair                              Cognition Arousal/Alertness: Awake/alert Behavior During Therapy: WFL for tasks assessed/performed Overall Cognitive Status: Within Functional Limits for tasks assessed                                        Exercises      General Comments        Pertinent Vitals/Pain Pain Assessment: 0-10 Pain Score: 7  Pain Location: upper back and neck Pain Descriptors / Indicators: Sore Pain Intervention(s): Monitored during session;Repositioned    Home Living                      Prior Function            PT Goals (current goals can now be found in the care plan section) Acute Rehab PT Goals Patient Stated Goal: to go home Potential to Achieve Goals: Good Progress towards PT goals: Progressing toward goals    Frequency    Min 3X/week      PT Plan Current plan remains appropriate    Co-evaluation  AM-PAC PT "6 Clicks" Daily Activity  Outcome Measure  Difficulty turning over in bed (including adjusting bedclothes, sheets and blankets)?: None Difficulty moving from lying on back to sitting on the side of the bed? : None Difficulty sitting down on and standing up from a chair with arms (e.g., wheelchair, bedside commode, etc,.)?: None Help needed moving to and from a bed to chair (including a wheelchair)?: None Help needed walking in hospital room?: A Little Help needed climbing 3-5 steps with a railing? : A Little 6 Click Score: 22    End of Session Equipment Utilized During Treatment: Gait belt;Oxygen Activity Tolerance: Patient tolerated treatment well Patient left: in bed;with call bell/phone within reach;with bed alarm set Nurse Communication:  Mobility status PT Visit Diagnosis: Other abnormalities of gait and mobility (R26.89)     Time: 8546-2703 PT Time Calculation (min) (ACUTE ONLY): 14 min  Charges:  $Gait Training: 8-22 mins                    G Codes:       Governor Rooks, PTA pager [place] 12/14/2017, 1:47 PM

## 2017-12-14 NOTE — Progress Notes (Signed)
[address]       [place] [id number]             [phone]      3 Days Post-Op Procedure(s) (LRB): IRRIGATION AND DEBRIDEMENT OF CHEST HEMATOMA (N/A) Subjective: Surgical site with minimal discomfort, some neck and back pain  Objective: Vital signs in last 24 hours: Temp:  [97.5 F (36.4 C)-97.8 F (36.6 C)] 97.5 F (36.4 C) (01/24 0500) Pulse Rate:  [78-106] 86 (01/24 0500) Cardiac Rhythm: Atrial flutter (01/23 2052) Resp:  [20] 20 (01/24 0500) BP: (104-124)/(54-61) 122/54 (01/24 0500) SpO2:  [100 %] 100 % (01/24 0500) Weight:  [161 lb 3.2 oz (73.1 kg)] 161 lb 3.2 oz (73.1 kg) (01/24 0500)  Hemodynamic parameters for last 24 hours:    Intake/Output from previous day: 01/23 0701 - 01/24 0700 In: 1615 [P.O.:960; I.V.:600; IV Piggyback:50] Out: 1950 [Urine:1950] Intake/Output this shift: No intake/output data recorded.  General appearance: alert, cooperative, fatigued and no distress Wound: dressing CDI  Lab Results: Recent Labs    12/13/17 0639 12/14/17 0511  WBC 14.4* 10.6*  HGB 9.6* 9.5*  HCT 30.8* 30.6*  PLT 301 282   BMET:  Recent Labs    12/13/17 0639 12/14/17 0511  NA 141 136  K 4.0 5.3*  CL 102 99*  CO2 29 26  GLUCOSE 73 312*  BUN 21* 22*  CREATININE 1.25* 1.17  CALCIUM 8.5* 8.1*    PT/INR: No results for input(s): LABPROT, INR in the last 72 hours. ABG    Component Value Date/Time   PHART 7.475 (H) 12/06/2017 1730   HCO3 27.4 12/06/2017 1730   TCO2 32 03/20/2015 1638   O2SAT 98.1 12/06/2017 1730   CBG (last 3)  Recent Labs    12/13/17 1808 12/13/17 2102 12/14/17 0727  GLUCAP 122* 188* 269*   Results for orders placed or performed during the hospital encounter of 12/03/17  Culture, blood (Routine X 2) w Reflex to ID Panel     Status: None   Collection Time: 12/04/17 12:40 PM  Result Value Ref Range Status   Specimen Description BLOOD RIGHT ANTECUBITAL  Final   Special Requests IN PEDIATRIC BOTTLE  Blood Culture adequate volume  Final   Culture NO GROWTH 5 DAYS  Final   Report Status 12/09/2017 FINAL  Final  Culture, blood (Routine X 2) w Reflex to ID Panel     Status: None   Collection Time: 12/04/17 12:44 PM  Result Value Ref Range Status   Specimen Description BLOOD RIGHT HAND  Final   Special Requests IN PEDIATRIC BOTTLE Blood Culture adequate volume  Final   Culture NO GROWTH 5 DAYS  Final   Report Status 12/09/2017 FINAL  Final  Body fluid culture     Status: None   Collection Time: 12/05/17  3:35 PM  Result Value Ref Range Status   Specimen Description FLUID ABDOMEN  Final   Special Requests Normal  Final   Gram Stain NO WBC SEEN NO ORGANISMS SEEN   Final   Culture No growth aerobically or anaerobically.  Final   Report Status 12/09/2017 FINAL  Final  Culture, blood (x 2)     Status: None   Collection Time: 12/06/17  8:21 PM  Result Value Ref Range Status   Specimen Description BLOOD HAND LEFT  Final   Special Requests IN PEDIATRIC BOTTLE Blood Culture adequate volume  Final   Culture NO GROWTH 5 DAYS  Final   Report  Status 12/11/2017 FINAL  Final  Culture, blood (x 2)     Status: None   Collection Time: 12/06/17  8:22 PM  Result Value Ref Range Status   Specimen Description BLOOD ARM RIGHT  Final   Special Requests IN PEDIATRIC BOTTLE Blood Culture adequate volume  Final   Culture NO GROWTH 5 DAYS  Final   Report Status 12/11/2017 FINAL  Final  MRSA PCR Screening     Status: None   Collection Time: 12/06/17  8:50 PM  Result Value Ref Range Status   MRSA by PCR NEGATIVE NEGATIVE Final    Comment:        The GeneXpert MRSA Assay (FDA approved for NASAL specimens only), is one component of a comprehensive MRSA colonization surveillance program. It is not intended to diagnose MRSA infection nor to guide or monitor treatment for MRSA infections.   Culture, Urine     Status: None   Collection Time: 12/06/17  9:43 PM  Result Value Ref Range Status   Specimen  Description URINE, RANDOM  Final   Special Requests NONE  Final   Culture NO GROWTH  Final   Report Status 12/08/2017 FINAL  Final  Anaerobic culture     Status: None (Preliminary result)   Collection Time: 12/11/17 12:47 PM  Result Value Ref Range Status   Specimen Description WOUND LEFT RECTUS HEMATOMA  Final   Special Requests PATIENT ON FOLLOWING ZINACEF  Final   Culture CULTURE REINCUBATED FOR BETTER GROWTH  Final   Report Status PENDING  Incomplete  Aerobic Culture (superficial specimen)     Status: None (Preliminary result)   Collection Time: 12/11/17 12:47 PM  Result Value Ref Range Status   Specimen Description WOUND LEFT RECTUS HEMATOMA  Final   Special Requests PATIENT ON FOLLOWING ZINACEF  Final   Gram Stain NO WBC SEEN NO ORGANISMS SEEN   Final   Culture NO GROWTH 2 DAYS  Final   Report Status PENDING  Incomplete   Meds Scheduled Meds: . amiodarone  200 mg Oral BID  . furosemide  40 mg Oral Daily  . insulin aspart  0-20 Units Subcutaneous TID WC  . insulin glargine  10 Units Subcutaneous Daily  . lidocaine  2 patch Transdermal Q24H  . linagliptin  5 mg Oral Daily  . metoprolol tartrate  25 mg Oral QID  . pravastatin  40 mg Oral q1800   Continuous Infusions: . sodium chloride 50 mL/hr at 12/12/17 1710  . cefUROXime (ZINACEF)  IV 1.5 g (12/14/17 0847)   PRN Meds:.acetaminophen, HYDROmorphone (DILAUDID) injection, levalbuterol, nitroGLYCERIN, ondansetron (ZOFRAN) IV, oxyCODONE, traZODone  Xrays No results found.  Assessment/Plan: S/P Procedure(s) (LRB): IRRIGATION AND DEBRIDEMENT OF CHEST HEMATOMA (N/A)  1 stable, min drainage, cont current plan for removal tomorrow  LOS: 11 days    Victor [name] 12/14/2017

## 2017-12-14 NOTE — Progress Notes (Signed)
Progress Note  Patient Name: Victor [name] Date of Encounter: 12/14/2017  Primary Cardiologist:   Peter Martinique, MD   Subjective   No acute complaints.  He continues to have DOE at baseline.  Anxious to go home.   Inpatient Medications    Scheduled Meds: . amiodarone  200 mg Oral BID  . furosemide  40 mg Oral Daily  . insulin aspart  0-20 Units Subcutaneous TID WC  . insulin glargine  10 Units Subcutaneous Daily  . lidocaine  2 patch Transdermal Q24H  . linagliptin  5 mg Oral Daily  . metoprolol tartrate  25 mg Oral QID  . pravastatin  40 mg Oral q1800   Continuous Infusions: . sodium chloride 50 mL/hr at 12/12/17 1710  . cefUROXime (ZINACEF)  IV Stopped (12/13/17 2128)   PRN Meds: acetaminophen, HYDROmorphone (DILAUDID) injection, levalbuterol, nitroGLYCERIN, ondansetron (ZOFRAN) IV, oxyCODONE, traZODone   Vital Signs    Vitals:   12/13/17 1129 12/13/17 1501 12/13/17 2008 12/14/17 0500  BP: (!) 104/56  124/61 (!) 122/54  Pulse: (!) 106 90 78 86  Resp:   20 20  Temp: 97.6 F (36.4 C)  97.8 F (36.6 C) (!) 97.5 F (36.4 C)  TempSrc: Oral  Oral Oral  SpO2: 100%  100% 100%  Weight:    161 lb 3.2 oz (73.1 kg)  Height:        Intake/Output Summary (Last 24 hours) at 12/14/2017 0844 Last data filed at 12/14/2017 0641 Gross per 24 hour  Intake 1375 ml  Output 1850 ml  Net -475 ml   Filed Weights   12/12/17 1900 12/13/17 0539 12/14/17 0500  Weight: 162 lb 3.2 oz (73.6 kg) 161 lb (73 kg) 161 lb 3.2 oz (73.1 kg)    Telemetry    Atrial fib - Personally Reviewed  ECG    NA - Personally Reviewed  Physical Exam   GEN: No  acute distress.   Neck: No  JVD Cardiac: Irregular RR, no murmurs, rubs, or gallops.  Respiratory: Clear   to auscultation bilaterally. GI: Soft, nontender, non-distended, normal bowel sounds, drain in place MS:  No edema; No deformity. Neuro:   Nonfocal  Psych: Oriented and appropriate    Labs    Chemistry Recent Labs  Lab  12/12/17 0330 12/13/17 0639 12/14/17 0511  NA 133* 141 136  K 5.0 4.0 5.3*  CL 98* 102 99*  CO2 23 29 26   GLUCOSE 322* 73 312*  BUN 19 21* 22*  CREATININE 1.19 1.25* 1.17  CALCIUM 8.1* 8.5* 8.1*  GFRNONAA 56* 53* 57*  GFRAA >60 >60 >60  ANIONGAP 12 10 11      Hematology Recent Labs  Lab 12/12/17 0330 12/13/17 0639 12/14/17 0511  WBC 7.8 14.4* 10.6*  RBC 3.45* 3.62* 3.54*  HGB 9.1* 9.6* 9.5*  HCT 29.2* 30.8* 30.6*  MCV 84.6 85.1 86.4  MCH 26.4 26.5 26.8  MCHC 31.2 31.2 31.0  RDW 14.5 14.3 14.8  PLT 206 301 282    Cardiac Enzymes Recent Labs  Lab 12/07/17 1106 12/07/17 1828  TROPONINI 0.08* 0.07*   No results for input(s): TROPIPOC in the last 168 hours.   BNPNo results for input(s): BNP, PROBNP in the last 168 hours.   DDimer No results for input(s): DDIMER in the last 168 hours.   Radiology    No results found.  Cardiac Studies   ECHO 12/07/17  Study Conclusions  - Left ventricle: The cavity size was normal. Systolic function was moderately  to severely reduced. The estimated ejection fraction was in the range of 30% to 35%. Diffuse hypokinesis. - Ventricular septum: Septal motion showed paradox. - Aortic valve: There was no regurgitation. - Mitral valve: Calcified annulus. Mildly thickened leaflets . There was mild regurgitation. - Left atrium: The atrium was normal in size. - Right atrium: The atrium was mildly dilated. - Tricuspid valve: There was moderate regurgitation. - Pulmonic valve: There was no regurgitation. - Pulmonary arteries: Systolic pressure was moderately increased. PA peak pressure: 50 mm Hg (S). - Inferior vena cava: The vessel was dilated. The respirophasic diameter changes were blunted (<50%), consistent with elevated central venous pressure. - Pericardium, extracardiac: There was no pericardial effusion.    Patient Profile     82 y.o. male with past medical history of CAD, s/p CABG, chronic combined  systolic/diastolic congestive heart failure, ischemic cardiomyopathy-last EF 35-40%, history ofVTstatus post ICD, hypertension, diabetes mellitus, chronic stage III kidney disease, and PVDadmitted with continuous chest pain and abdominal pain. CT scan at an outside hospital showed fluid collection around ICD wires.Troponin minimally elevated.   Assessment & Plan     RECTUS SHEATH HEMATOMA:    Status post drainage of the left upper abdominal wall subrectus hematoma.  Off of Eliquis.  Restart when drain removed possibly 1/25.    ATRIAL FIB:   On oral amiodarone.  Plan is to consider cardioversion in 3 - 4 weeks after Eliquis restarted.  Continue current therapy.    CAD:  The patient has no new sypmtoms.  No change in therapy.    CKD III:   Creat is improved.  Potassium is slightly elevated.  He is not on supplement.  Repeat in AM.   ACUTE ON CHRONIC CHF:  Lasix decreased to 40 mg yesterday.  I will follow for final adjustment before discharge.   DM:  On scheduled insulin and SSI.    ACUTE ENCEPHALOPATHY:  He had this earlier this admission.  We are avoiding psychotropic meds.    DNR/DNI     For questions or updates, please contact [place] Please consult [email or web address] for contact info under Cardiology/STEMI.   Signed, Minus Breeding, MD  12/14/2017, 8:44 AM

## 2017-12-14 NOTE — Plan of Care (Signed)
  Health Behavior/Discharge Planning: Ability to manage health-related needs will improve 12/14/2017 1041 - Progressing by Lavonna Rua, RN   Clinical Measurements: Ability to maintain clinical measurements within normal limits will improve 12/14/2017 1041 - Progressing by Lavonna Rua, RN Will remain free from infection 12/14/2017 1041 - Progressing by Lavonna Rua, RN Diagnostic test results will improve 12/14/2017 1041 - Progressing by Lavonna Rua, RN Respiratory complications will improve 12/14/2017 1041 - Progressing by Lavonna Rua, RN Cardiovascular complication will be avoided 12/14/2017 1041 - Progressing by Lavonna Rua, RN   Activity: Risk for activity intolerance will decrease 12/14/2017 1041 - Progressing by Lavonna Rua, RN   Elimination: Will not experience complications related to bowel motility 12/14/2017 1041 - Progressing by Lavonna Rua, RN Will not experience complications related to urinary retention 12/14/2017 1041 - Progressing by Lavonna Rua, RN   Pain Managment: General experience of comfort will improve 12/14/2017 1041 - Progressing by Lavonna Rua, RN   Safety: Ability to remain free from injury will improve 12/14/2017 1041 - Progressing by Lavonna Rua, RN   Skin Integrity: Risk for impaired skin integrity will decrease 12/14/2017 1041 - Progressing by Lavonna Rua, RN

## 2017-12-15 ENCOUNTER — Telehealth: Payer: Self-pay | Admitting: Physician Assistant

## 2017-12-15 ENCOUNTER — Inpatient Hospital Stay (HOSPITAL_COMMUNITY): Payer: Medicare Other

## 2017-12-15 ENCOUNTER — Encounter (HOSPITAL_COMMUNITY): Payer: Self-pay | Admitting: Cardiology

## 2017-12-15 DIAGNOSIS — E119 Type 2 diabetes mellitus without complications: Secondary | ICD-10-CM

## 2017-12-15 DIAGNOSIS — Z794 Long term (current) use of insulin: Secondary | ICD-10-CM

## 2017-12-15 DIAGNOSIS — I10 Essential (primary) hypertension: Secondary | ICD-10-CM

## 2017-12-15 DIAGNOSIS — Z66 Do not resuscitate: Secondary | ICD-10-CM | POA: Diagnosis not present

## 2017-12-15 DIAGNOSIS — I4891 Unspecified atrial fibrillation: Secondary | ICD-10-CM

## 2017-12-15 DIAGNOSIS — I4819 Other persistent atrial fibrillation: Secondary | ICD-10-CM

## 2017-12-15 DIAGNOSIS — R5381 Other malaise: Secondary | ICD-10-CM

## 2017-12-15 DIAGNOSIS — J449 Chronic obstructive pulmonary disease, unspecified: Secondary | ICD-10-CM | POA: Diagnosis present

## 2017-12-15 DIAGNOSIS — I509 Heart failure, unspecified: Secondary | ICD-10-CM

## 2017-12-15 HISTORY — DX: Other persistent atrial fibrillation: I48.19

## 2017-12-15 HISTORY — DX: Other malaise: R53.81

## 2017-12-15 LAB — BASIC METABOLIC PANEL
Anion gap: 12 (ref 5–15)
BUN: 24 mg/dL — AB (ref 6–20)
CO2: 27 mmol/L (ref 22–32)
CREATININE: 1.16 mg/dL (ref 0.61–1.24)
Calcium: 8.5 mg/dL — ABNORMAL LOW (ref 8.9–10.3)
Chloride: 97 mmol/L — ABNORMAL LOW (ref 101–111)
GFR calc Af Amer: >60 mL/min (ref >60)
GFR, EST NON AFRICAN AMERICAN: 58 mL/min — AB (ref >60)
GLUCOSE: 247 mg/dL — AB (ref 65–99)
Potassium: 4.8 mmol/L (ref 3.5–5.1)
SODIUM: 136 mmol/L (ref 135–145)

## 2017-12-15 LAB — GLUCOSE, CAPILLARY
GLUCOSE-CAPILLARY: 250 mg/dL — AB (ref 65–99)
GLUCOSE-CAPILLARY: 238 mg/dL — AB (ref 65–99)
GLUCOSE-CAPILLARY: 236 mg/dL — AB (ref 65–99)
Glucose-Capillary: 218 mg/dL — ABNORMAL HIGH (ref 65–99)

## 2017-12-15 MED ORDER — METHOCARBAMOL 750 MG PO TABS: 750.0000 mg | ORAL_TABLET | Freq: Three times a day (TID) | ORAL | 1 refills | Status: AC | PRN

## 2017-12-15 MED ORDER — LINAGLIPTIN 5 MG PO TABS: 5.0000 mg | ORAL_TABLET | Freq: Every day | ORAL | 1 refills | Status: DC

## 2017-12-15 MED ORDER — APIXABAN 5 MG PO TABS: 5.0000 mg | ORAL_TABLET | Freq: Two times a day (BID) | ORAL | 6 refills | Status: DC

## 2017-12-15 MED ORDER — FUROSEMIDE 40 MG PO TABS: 40.0000 mg | ORAL_TABLET | Freq: Two times a day (BID) | ORAL | 6 refills | Status: DC

## 2017-12-15 MED ORDER — ACETAMINOPHEN 325 MG PO TABS: 650.0000 mg | ORAL_TABLET | ORAL | Status: AC | PRN

## 2017-12-15 MED ORDER — INSULIN GLARGINE 100 UNIT/ML ~~LOC~~ SOLN: 10.0000 [IU] | Freq: Every day | SUBCUTANEOUS | 11 refills | Status: DC

## 2017-12-15 MED ORDER — AMIODARONE HCL 200 MG PO TABS: 200.0000 mg | ORAL_TABLET | Freq: Every day | ORAL | 6 refills | Status: AC

## 2017-12-15 MED ORDER — FUROSEMIDE 10 MG/ML IJ SOLN
40.0000 mg | Freq: Once | INTRAMUSCULAR | Status: AC
  Administered 2017-12-15: 40 mg via INTRAVENOUS
  Filled 2017-12-15: qty 4

## 2017-12-15 MED ORDER — APIXABAN 5 MG PO TABS
5.0000 mg | ORAL_TABLET | Freq: Two times a day (BID) | ORAL | Status: DC
  Administered 2017-12-15 – 2017-12-16 (×2): 5 mg via ORAL
  Filled 2017-12-15 (×2): qty 1

## 2017-12-15 MED ORDER — LIDOCAINE 5 % EX PTCH: 2.0000 | MEDICATED_PATCH | CUTANEOUS | 0 refills | Status: DC

## 2017-12-15 NOTE — Progress Notes (Signed)
Pt discharge instructions reviewed with pt. Pt verbalizes understanding. Pt belongings with pt. Pt is not in distress. RN called pt's grandson when discharge orders were placed and grandson Audiological scientist) states he will arrive around 6:30 pm to take pt home.

## 2017-12-15 NOTE — Discharge Summary (Signed)
Discharge Summary    Patient ID: Victor [name],  MRN: [id number], DOB/AGE: 1936/06/13 82 y.o.  Admit date: 12/03/2017 Discharge date: 12/15/2017  Primary Care Provider: Cyndi Bender Primary Cardiologist: Peter Martinique, MD  Discharge Diagnoses    Principal Problem:   Abdominal wall fluid collections Active Problems:   Atrial fibrillation, persistent (HCC)   Insulin dependent type 2 diabetes mellitus, controlled ([place])   Cardiomyopathy, ischemic   Automatic implantable cardioverter-defibrillator in situ   Acute on chronic systolic CHF (congestive heart failure) (HCC)   Essential hypertension   Hx of CABG 1990   OSA (obstructive sleep apnea)   Stage 3 chronic kidney disease ([place])   Chest pain   Leukocytosis   DNR (do not resuscitate)   Debilitated   COPD (chronic obstructive pulmonary disease) ([place])   Allergies Allergies  Allergen Reactions  . Coreg [Carvedilol] Other (See Comments)    Shortness of breath ,fatigue ,dizzyness   . Lovastatin Other (See Comments)    CK elevation, Myalgias  . Ativan [Lorazepam] Other (See Comments)    Pt. had side effects    Diagnostic Studies/Procedures    12/07/17 Echo  Study Conclusions  - Left ventricle: The cavity size was normal. Systolic function was   moderately to severely reduced. The estimated ejection fraction   was in the range of 30% to 35%. Diffuse hypokinesis. - Ventricular septum: Septal motion showed paradox. - Aortic valve: There was no regurgitation. - Mitral valve: Calcified annulus. Mildly thickened leaflets .   There was mild regurgitation. - Left atrium: The atrium was normal in size. - Right atrium: The atrium was mildly dilated. - Tricuspid valve: There was moderate regurgitation. - Pulmonic valve: There was no regurgitation. - Pulmonary arteries: Systolic pressure was moderately increased.   PA peak pressure: 50 mm Hg (S). - Inferior vena cava: The vessel was dilated. The respirophasic   diameter  changes were blunted (< 50%), consistent with elevated   central venous pressure. - Pericardium, extracardiac: There was no pericardial effusion.  Impressions:  - Moderate to severe LV systolic dysfunction with diffuse   hypokinesis, akinesis of the basal and mid inferior and   inferolateral walls and paradoxical septal motion. LVEF 30-35%.   RVEF normal, moderate pulmonary hypertension.  _____________   History of Present Illness     82 y.o. male with a hx of CAD s/p CABG in 1990 with known occlusion of both vein grafts, PCI to SVG-OM3 in 2014 and attempted CTO PCI of RCA 09/2014, chronic combined systoic and diastolic heart failure, ischemic cardiomyopathy with history of VT in 2012 s/p ICD, DM, CKD, HTN, HLD, PAD, and carotid stenosis transferred from [facility] ED for chest pain and + troponin.  Patient lives in [place], alone.  Previous recent admission due to shortness of breath WITHOUT chest pain 12/19-12/20 and treated for CHF.  At that time, Troponin elevated to 1.7, EF 45-50%.  He was managed for CHF, no ischemic eval per Dr. Martinique.  At [facility], got CT C/A/P without dissection/PE.  Did note fluid collection or seroma surrounding the wires of the epicardial pacer in the left anterior abdominal wall anterior to the rectus sheath.    He was admitted for further eval. .   Hospital Course     Consultants: Dr. Fernande Boyden and ID-Dr. Megan Salon, [facility].   Pt's troponin was 0.06 and not felt to be acute coronary syndrome.  Possible musculoskeletal.   EP was consulted -CT scan at [facility] demonstrated fluid collection  or seroma surrounding epicardial wires from abandoned epicardial system. Ultrasound done here confirmed fluid collection around epicardial pacing wires.  WBC was elevated. ESR was also elevated.  Blood cultures were drawn.  Dr. Prescott Gum and ID-Dr. Megan Salon  were consulted as well.  ABXs were held.   Felt this was non-infected hematoma around epicardial wires  from previous PPM in abdominal cavity.  Surgical drainage was planned and his Eliquis was stopped and IV heparin started.  On 12/11/17 pt underwent irrigation and debridement of chest hematoma.  Pt had JP drain until 12/15/17 and plans to resume the Eliquis this PM the 25th.   He did develop AKI with pk Cr to 2.6 due to ATN,  Cr did decrease with gentle hydration and holding the lasix and cozaar. 12/15/17 Cr is 1.16.  Also with some hyperkalemia and this has improved to 4.8   Diabetes, we managed with reducing long acting insulin and then once he began eating more [facility] was consulted to manage the diabetes. Discharged with 15 units in the AM and Tradjenta daily.   Along with acute metabolic encephalopathy which resolved by discharge. Felt this was due to increased dose of cymbalta, lyrica, tramadol and zananflex.     He developed a fib on the 16th with abnormal EKG - was not STEMI though EKG read as such, but the a fib distorted the T wave.   He was placed on amiodarone with plan for DCCV in 3-4 weeks after back on amiodarone.  Acute on chronic systolic HF -pt was diuresed until Cr elevated then held and IV fluids.  12/15/17 pt with SOB and wt up 5 lbs.  40 mg IV lasix given.  CXR done. With mild pulmonary congestion. This afternoon he feels much better and would like to go home.  Dr. Percival Spanish agreed.   Debilitation with recommendation for [facility] RN, PT, OT, nurse's aide.  Through [facility].  This was arranged by care manager.    He is on chronic home oxygen for COPD and continued during hospitalization.    Per Dr. Percival Spanish: "Lasix decreased to 40 mg two days ago.  This should be increased to 40 BID when he goes home.  He is not on Cozaar but this can be considered at home.  He is not on spironolactone.  Still hold this at discharge.  I will start a low dose of Zebeta and this can be titrated as an out patient.  He will need TOC follow up for med adjustment and labs next week."     _____________  Discharge Vitals Blood pressure (!) 147/61, pulse 75, temperature 97.8 F (36.6 C), temperature source Oral, resp. rate 14, height '5\' 8"'$  (1.727 m), weight 165 lb 4.8 oz (75 kg), SpO2 100 %.  Filed Weights   12/13/17 0539 12/14/17 0500 12/15/17 0500  Weight: 161 lb (73 kg) 161 lb 3.2 oz (73.1 kg) 165 lb 4.8 oz (75 kg)    Labs & Radiologic Studies    CBC Recent Labs    12/13/17 0639 12/14/17 0511  WBC 14.4* 10.6*  HGB 9.6* 9.5*  HCT 30.8* 30.6*  MCV 85.1 86.4  PLT 301 751   Basic Metabolic Panel Recent Labs    12/14/17 0511 12/15/17 0640  NA 136 136  K 5.3* 4.8  CL 99* 97*  CO2 26 27  GLUCOSE 312* 247*  BUN 22* 24*  CREATININE 1.17 1.16  CALCIUM 8.1* 8.5*   Liver Function Tests No results for input(s): AST,  ALT, ALKPHOS, BILITOT, PROT, ALBUMIN in the last 72 hours. No results for input(s): LIPASE, AMYLASE in the last 72 hours. Cardiac Enzymes No results for input(s): CKTOTAL, CKMB, CKMBINDEX, TROPONINI in the last 72 hours. BNP Invalid input(s): POCBNP D-Dimer No results for input(s): DDIMER in the last 72 hours. Hemoglobin A1C No results for input(s): HGBA1C in the last 72 hours. Fasting Lipid Panel No results for input(s): CHOL, HDL, LDLCALC, TRIG, CHOLHDL, LDLDIRECT in the last 72 hours. Thyroid Function Tests No results for input(s): TSH, T4TOTAL, T3FREE, THYROIDAB in the last 72 hours.  Invalid input(s): FREET3 _____________  Dg Chest 2 View  Result Date: 12/03/2017 CLINICAL DATA:  Left upper chest pain.  Previous rib fractures EXAM: CHEST  2 VIEW COMPARISON:  12/02/2017 FINDINGS: CABG. Left AICD remains in place, unchanged. There is cardiomegaly. Low lung volumes. Left base atelectasis. No effusion or pneumothorax. IMPRESSION: Low lung volumes.  Left base atelectasis. Cardiomegaly. Electronically Signed   By: Rolm Baptise M.D.   On: 12/03/2017 07:13   US Abdomen Limited  Result Date: 12/03/2017 CLINICAL DATA:  Left anterior  abdominal wall fluid collection seen on CT EXAM: ULTRASOUND ABDOMEN LIMITED COMPARISON:  Chest CT 12/02/2017 FINDINGS: As noted on CT, there is a fluid collection in the anterior abdominal wall surrounding the pacer wires. The fluid collection measures 9.1 x 5.0 x 2.4 cm. Internal echoes noted. IMPRESSION: Complex fluid collection noted around the pacer wires in the anterior abdominal wall measuring up to 9 cm. Electronically Signed   By: Rolm Baptise M.D.   On: 12/03/2017 07:12   US Aspiration  Result Date: 12/05/2017 INDICATION: 82 year old with a fluid collection surrounding epicardial pacer wires in left anterior abdominal wall. EXAM: ULTRASOUND-GUIDED ASPIRATION OF LEFT ABDOMINAL WALL FLUID COLLECTION MEDICATIONS: None ANESTHESIA/SEDATION: None COMPLICATIONS: None immediate. PROCEDURE: Informed written consent was obtained from the patient after a thorough discussion of the procedural risks, benefits and alternatives. All questions were addressed. A timeout was performed prior to the initiation of the procedure. The fluid collection in the left anterior abdominal wall was identified with ultrasound. This area was prepped with chlorhexidine and sterile field was created. Skin was anesthetized with 1% lidocaine. Using ultrasound guidance, an 18 gauge spinal needle was directed into the collection. Very thick brown fluid was aspirated. Very little fluid could be aspirated from this needle. Therefore, a 5 [place] Yueh catheter was directed into the collection with ultrasound guidance. Again, very thick brown fluid was removed. Approximately 30 mL of fluid was able to be removed. The entire collection could not be decompressed. Yueh catheter was removed. Bandage placed over the puncture site. FINDINGS: Complex fluid collection surrounding epicardial pacer wires in left anterior abdominal wall. Yueh catheter was successfully advanced into the collection and very thick brown fluid was aspirated. 30 mL of fluid was  removed. There was still a large amount of fluid remaining in this collection at the end of the procedure. IMPRESSION: Ultrasound-guided aspiration of the complex fluid collection in the left anterior abdominal wall surrounding epicardial pacer wires. Very thick brown fluid was removed from this collection and sent for culture. Based on the fluid consistency, the patient will likely need surgical intervention to remove all the fluid. Electronically Signed   By: Markus Daft M.D.   On: 12/05/2017 20:24   Dg Chest Port 1 View  Result Date: 12/15/2017 CLINICAL DATA:  Shortness of breath.  History of AFib. EXAM: PORTABLE CHEST 1 VIEW COMPARISON:  12/09/2017 FINDINGS: Stable postsurgical changes from CABG.  Stable transvenous and direct epicardial pacer leads. Enlarged cardiac silhouette. Mediastinal contours appear intact. Calcific atherosclerotic disease of the aorta. There is no evidence of pneumothorax. No frank pulmonary edema. Low lung volumes with mild prominence of the interstitium. Persistent peribronchial airspace opacities in the left lower lobe. Possible small left pleural effusion. Osseous structures are without acute abnormality. Soft tissues are grossly normal. IMPRESSION: Low lung volumes with mild pulmonary vascular congestion. Enlarged cardiac silhouette. Persistent left lower lobe peribronchial airspace consolidation versus atelectasis. Possible small left pleural effusion. Electronically Signed   By: Fidela Salisbury M.D.   On: 12/15/2017 12:01   Dg Chest Port 1 View  Result Date: 12/09/2017 CLINICAL DATA:  Shortness of breath EXAM: PORTABLE CHEST 1 VIEW COMPARISON:  12/06/2017 FINDINGS: Chronic cardiomegaly. Status post CABG. There is trans venous and direct epicardial pacer leads in stable position. Low volume chest with interstitial crowding and asymmetric streaky retrocardiac density. There is no edema, air bronchogram, effusion, or pneumothorax. IMPRESSION: Stable low lung volumes with  probable retrocardiac atelectasis. Electronically Signed   By: Monte Fantasia M.D.   On: 12/09/2017 19:48   Dg Chest Port 1 View  Result Date: 12/06/2017 CLINICAL DATA:  Shortness of breath EXAM: PORTABLE CHEST 1 VIEW COMPARISON:  12/03/2017 FINDINGS: Bilateral diffuse mild interstitial thickening. No pleural effusion or pneumothorax. No focal consolidation. Stable cardiomegaly. Prior CABG. Single lead cardiac pacemaker. No acute osseous abnormality. IMPRESSION: Cardiomegaly with pulmonary vascular congestion. Electronically Signed   By: Kathreen Devoid   On: 12/06/2017 20:18   Disposition   Pt is being discharged home today in good condition.  Follow-up Plans & Appointments    Follow-up Information    Ivin Poot, MD Follow up.   Specialty:  Cardiothoracic Surgery Why:  His office will call you for follow up appt.   Contact information: [address] [place] [address] [phone]        Martinique, Peter M, MD Follow up on 12/22/2017.   Specialty:  Cardiology Why:  at 9:45am WITH his PA [facility] information: [address] [place] [address] [phone]        Cyndi Bender, PA-C Follow up.   Specialty:  Physician Assistant Why:  Call Monday to be seen next week for your diabetes. Contact information: [place] [address] [phone]           Important to have a night time snack.    Heart healthy low salt diabetic diet  Home oxygen as before.  Weigh daily call if wt increases 3 lbs in a day or 5 lbs in a week.  We adjusted your medications, please follow the instructions of when you leave the hospital    Discharge Medications   Allergies as of 12/15/2017      Reactions   Coreg [carvedilol] Other (See Comments)   Shortness of breath ,fatigue ,dizzyness    Lovastatin Other (See Comments)   CK elevation, Myalgias   Ativan [lorazepam] Other (See Comments)   Pt. had side effects       Medication List    STOP taking these medications   ASPIR-81 81 MG EC tablet Generic drug:  aspirin   clopidogrel 75 MG tablet Commonly known as:  PLAVIX   dexamethasone 4 MG tablet Commonly known as:  DECADRON   HUMALOG KWIKPEN 100 UNIT/ML KiwkPen Generic drug:  insulin lispro   LEVEMIR FLEXTOUCH 100 UNIT/ML Pen Generic drug:  Insulin Detemir  losartan 50 MG tablet Commonly known as:  COZAAR   LYRICA 150 MG capsule Generic drug:  pregabalin   mupirocin ointment 2 % Commonly known as:  BACTROBAN   spironolactone 25 MG tablet Commonly known as:  ALDACTONE     TAKE these medications   acetaminophen 325 MG tablet Commonly known as:  TYLENOL Take 2 tablets (650 mg total) by mouth every 4 (four) hours as needed for headache or mild pain.   amiodarone 200 MG tablet Commonly known as:  PACERONE Take 1 tablet (200 mg total) by mouth daily.   apixaban 5 MG Tabs tablet Commonly known as:  ELIQUIS Take 1 tablet (5 mg total) by mouth 2 (two) times daily.   bisoprolol 5 MG tablet Commonly known as:  ZEBETA Take 0.5 tablets (2.5 mg total) by mouth daily.   CALCIUM PO Take 1 tablet by mouth at bedtime.   docusate sodium 100 MG capsule Commonly known as:  COLACE Take 1 capsule (100 mg total) by mouth at bedtime.   DULoxetine 30 MG capsule Commonly known as:  CYMBALTA Take 30 mg by mouth daily.   furosemide 40 MG tablet Commonly known as:  LASIX Take 1 tablet (40 mg total) by mouth 2 (two) times daily.   insulin glargine 100 UNIT/ML injection Commonly known as:  LANTUS Inject 0.1 mLs (10 Units total) into the skin daily. Start taking on:  12/16/2017   lidocaine 5 % Commonly known as:  LIDODERM Place 2 patches onto the skin daily. Remove & Discard patch within 12 hours or as directed by MD Start taking on:  12/16/2017   linagliptin 5 MG Tabs tablet Commonly known as:  TRADJENTA Take 1 tablet (5 mg total) by mouth daily. Start taking on:  12/16/2017    methocarbamol 750 MG tablet Commonly known as:  ROBAXIN Take 1 tablet (750 mg total) by mouth every 8 (eight) hours as needed for muscle spasms.   nitroGLYCERIN 0.4 MG SL tablet Commonly known as:  NITROSTAT Place 0.4 mg under the tongue every 5 (five) minutes as needed for chest pain.   omeprazole 20 MG capsule Commonly known as:  PRILOSEC Take 20 mg by mouth every morning.   pravastatin 40 MG tablet Commonly known as:  PRAVACHOL Take 1 tablet (40 mg total) by mouth daily at 6 PM.   PRESCRIPTION MEDICATION Apply 1 application topically as needed (Neck Pain).   traMADol 50 MG tablet Commonly known as:  ULTRAM Take 50 mg by mouth 3 (three) times daily as needed for pain.   traZODone 50 MG tablet Commonly known as:  DESYREL Take 1 tablet by mouth at bedtime.   VITAMIN B-12 PO Take 1 tablet by mouth daily.          Outstanding Labs/Studies   BMP   Duration of Discharge Encounter   Greater than 30 minutes including physician time.  Signed, Cecilie Kicks NP 12/15/2017, 4:08 PM

## 2017-12-15 NOTE — Progress Notes (Signed)
Pt's grandson states he did not bring pt's oxygen and that it is in his truck at a workshop that is closed. Pt's grandson states he is unable to get oxygen until tomorrow. RN called on-call case manager. CM states there is nothing that can be done until tomorrow. RN notified MD.

## 2017-12-15 NOTE — Care Management Note (Signed)
Case Management Note  Patient Details  Name: Victor [name] MRN: [id number] Date of Birth: 31-Dec-1935  Subjective/Objective:   Abdominal Wall Fluid Abscess               Action/Plan: Patient lives at home with his grandson; PCP is in [place], he cant remember his name at this time; has private insurance with Medicare with prescription drug coverage; pharmacy of choice is [facility]; DME - home oxygen, cane,walker and scooter at home; his grandson's wife takes him to his apts; [place] choice offered, pt chose [facility]; [place] with [facility] called for arrangements.  Expected Discharge Date:  1/25/20198           Expected Discharge Plan:  [place]  Discharge planning Services  CM Consult, Medication Assistance  Choice offered to:  Patient  HH Arranged:  RN, PT, OT, Nurse's Aide [place] Agency:  [facility] (formerly [facility])  Status of Service:  In process, will continue to follow  Sherrilyn Rist [phone] 12/15/2017, 11:01 AM

## 2017-12-15 NOTE — Progress Notes (Signed)
      [address]       [place] [id number]             [phone]      4 Days Post-Op Procedure(s) (LRB): IRRIGATION AND DEBRIDEMENT OF CHEST HEMATOMA (N/A) Subjective: Didn't sleep well, min discomfort from JP/incision  Objective: Vital signs in last 24 hours: Temp:  [97.8 F (36.6 C)] 97.8 F (36.6 C) (01/25 0500) Pulse Rate:  [79-96] 79 (01/25 0500) Cardiac Rhythm: Atrial fibrillation (01/25 0732) Resp:  [18-20] 18 (01/25 0500) BP: (118-143)/(55-83) 118/83 (01/25 0500) SpO2:  [95 %-100 %] 95 % (01/25 0500) Weight:  [165 lb 4.8 oz (75 kg)] 165 lb 4.8 oz (75 kg) (01/25 0500)  Hemodynamic parameters for last 24 hours:    Intake/Output from previous day: 01/24 0701 - 01/25 0700 In: 2350 [P.O.:1100; I.V.:1150; IV Piggyback:100] Out: 1210 [Urine:1200; Drains:10] Intake/Output this shift: No intake/output data recorded.  General appearance: alert, cooperative and no distress Wound: healing well  Lab Results: Recent Labs    12/13/17 0639 12/14/17 0511  WBC 14.4* 10.6*  HGB 9.6* 9.5*  HCT 30.8* 30.6*  PLT 301 282   BMET:  Recent Labs    12/14/17 0511 12/15/17 0640  NA 136 136  K 5.3* 4.8  CL 99* 97*  CO2 26 27  GLUCOSE 312* 247*  BUN 22* 24*  CREATININE 1.17 1.16  CALCIUM 8.1* 8.5*    PT/INR: No results for input(s): LABPROT, INR in the last 72 hours. ABG    Component Value Date/Time   PHART 7.475 (H) 12/06/2017 1730   HCO3 27.4 12/06/2017 1730   TCO2 32 03/20/2015 1638   O2SAT 98.1 12/06/2017 1730   CBG (last 3)  Recent Labs    12/14/17 1745 12/14/17 2102 12/15/17 0713  GLUCAP 80 255* 250*    Meds Scheduled Meds: . amiodarone  200 mg Oral BID  . furosemide  40 mg Oral Daily  . insulin glargine  10 Units Subcutaneous Daily  . lidocaine  2 patch Transdermal Q24H  . linagliptin  5 mg Oral Daily  . metoprolol tartrate  25 mg Oral QID  . pravastatin  40 mg Oral q1800   Continuous Infusions: . sodium chloride 50 mL/hr  at 12/14/17 1805  . cefUROXime (ZINACEF)  IV Stopped (12/14/17 2245)   PRN Meds:.acetaminophen, levalbuterol, methocarbamol, nitroGLYCERIN, ondansetron (ZOFRAN) IV, traMADol, traZODone  Xrays No results found.  Assessment/Plan: S/P Procedure(s) (LRB): IRRIGATION AND DEBRIDEMENT OF CHEST HEMATOMA (N/A)   1 doing well post op  2 I removed the JP drain 3 can restart Eliquis this evening 4 home when medically ready per primary and cardiology 5 our office will contact patient with f/u appt with surgeon   LOS: 12 days    [name] 12/15/2017

## 2017-12-15 NOTE — Discharge Instructions (Addendum)
Important to have a night time snack.    Heart healthy low salt diabetic diet  Home oxygen as before.  Weigh daily call if wt increases 3 lbs in a day or 5 lbs in a week.  We adjusted your medications, please follow the instructions of when you leave the hospital  If you have any trouble getting your meds please call  See your primary doctor about your diabetes.    Information on my medicine - ELIQUIS (apixaban)  This medication education was reviewed with me or my healthcare representative as part of my discharge preparation.  The pharmacist that spoke with me during my hospital stay was:  Tad Moore, [facility]  Why was Eliquis prescribed for you? Eliquis was prescribed for you to reduce the risk of forming blood clots that can cause a stroke if you have a medical condition called atrial fibrillation (a type of irregular heartbeat) OR to reduce the risk of a blood clots forming after orthopedic surgery.  What do You need to know about Eliquis ? Take your Eliquis TWICE DAILY - one tablet in the morning and one tablet in the evening with or without food.  It would be best to take the doses about the same time each day.  If you have difficulty swallowing the tablet whole please discuss with your pharmacist how to take the medication safely.  Take Eliquis exactly as prescribed by your doctor and DO NOT stop taking Eliquis without talking to the doctor who prescribed the medication.  Stopping may increase your risk of developing a new clot or stroke.  Refill your prescription before you run out.  After discharge, you should have regular check-up appointments with your healthcare provider that is prescribing your Eliquis.  In the future your dose may need to be changed if your kidney function or weight changes by a significant amount or as you get older.  What do you do if you miss a dose? If you miss a dose, take it as soon as you remember on the same day and resume taking twice  daily.  Do not take more than one dose of ELIQUIS at the same time.  Important Safety Information A possible side effect of Eliquis is bleeding. You should call your healthcare provider right away if you experience any of the following: ? Bleeding from an injury or your nose that does not stop. ? Unusual colored urine (red or dark brown) or unusual colored stools (red or black). ? Unusual bruising for unknown reasons. ? A serious fall or if you hit your head (even if there is no bleeding).  Some medicines may interact with Eliquis and might increase your risk of bleeding or clotting while on Eliquis. To help avoid this, consult your healthcare provider or pharmacist prior to using any new prescription or non-prescription medications, including herbals, vitamins, non-steroidal anti-inflammatory drugs (NSAIDs) and supplements.  This website has more information on Eliquis (apixaban): [email or web address].

## 2017-12-15 NOTE — Progress Notes (Signed)
Progress Note  Patient Name: Victor [name] Date of Encounter: 12/15/2017  Primary Cardiologist:   Peter Martinique, MD   Subjective   He feels well and wants to go home.   Inpatient Medications    Scheduled Meds: . amiodarone  200 mg Oral BID  . furosemide  40 mg Oral Daily  . insulin glargine  10 Units Subcutaneous Daily  . lidocaine  2 patch Transdermal Q24H  . linagliptin  5 mg Oral Daily  . metoprolol tartrate  25 mg Oral QID  . pravastatin  40 mg Oral q1800   Continuous Infusions: . sodium chloride 50 mL/hr at 12/14/17 1805  . cefUROXime (ZINACEF)  IV Stopped (12/14/17 2245)   PRN Meds: acetaminophen, levalbuterol, methocarbamol, nitroGLYCERIN, ondansetron (ZOFRAN) IV, traMADol, traZODone   Vital Signs    Vitals:   12/14/17 0500 12/14/17 1100 12/14/17 2022 12/15/17 0500  BP: (!) 122/54  (!) 143/55 118/83  Pulse: 86  96 79  Resp: 20  20 18   Temp: (!) 97.5 F (36.4 C)  97.8 F (36.6 C) 97.8 F (36.6 C)  TempSrc: Oral  Oral Oral  SpO2: 100% 95% 100% 95%  Weight: 161 lb 3.2 oz (73.1 kg)   165 lb 4.8 oz (75 kg)  Height:        Intake/Output Summary (Last 24 hours) at 12/15/2017 1517 Last data filed at 12/15/2017 0600 Gross per 24 hour  Intake 2350 ml  Output 1210 ml  Net 1140 ml   Filed Weights   12/13/17 0539 12/14/17 0500 12/15/17 0500  Weight: 161 lb (73 kg) 161 lb 3.2 oz (73.1 kg) 165 lb 4.8 oz (75 kg)    Telemetry    Atrial fib with controlled rate - Personally Reviewed  ECG    NA - Personally Reviewed  Physical Exam   GEN: No  acute distress.   Neck: No  JVD Cardiac: IrregularRR, no murmurs, rubs, or gallops.  Respiratory: Clear   to auscultation bilaterally. GI: Soft, nontender, non-distended, normal bowel sounds.  No drain in place MS:  No edema; No deformity. Neuro:   Nonfocal  Psych: Oriented and appropriate    Labs    Chemistry Recent Labs  Lab 12/13/17 0639 12/14/17 0511 12/15/17 0640  NA 141 136 136  K 4.0 5.3* 4.8    CL 102 99* 97*  CO2 29 26 27   GLUCOSE 73 312* 247*  BUN 21* 22* 24*  CREATININE 1.25* 1.17 1.16  CALCIUM 8.5* 8.1* 8.5*  GFRNONAA 53* 57* 58*  GFRAA >60 >60 >60  ANIONGAP 10 11 12      Hematology Recent Labs  Lab 12/12/17 0330 12/13/17 0639 12/14/17 0511  WBC 7.8 14.4* 10.6*  RBC 3.45* 3.62* 3.54*  HGB 9.1* 9.6* 9.5*  HCT 29.2* 30.8* 30.6*  MCV 84.6 85.1 86.4  MCH 26.4 26.5 26.8  MCHC 31.2 31.2 31.0  RDW 14.5 14.3 14.8  PLT 206 301 282    Cardiac Enzymes No results for input(s): TROPONINI in the last 168 hours. No results for input(s): TROPIPOC in the last 168 hours.   BNPNo results for input(s): BNP, PROBNP in the last 168 hours.   DDimer No results for input(s): DDIMER in the last 168 hours.   Radiology    No results found.  Cardiac Studies   ECHO 12/07/17  Study Conclusions  - Left ventricle: The cavity size was normal. Systolic function was moderately to severely reduced. The estimated ejection fraction was in the range of 30% to 35%.  Diffuse hypokinesis. - Ventricular septum: Septal motion showed paradox. - Aortic valve: There was no regurgitation. - Mitral valve: Calcified annulus. Mildly thickened leaflets . There was mild regurgitation. - Left atrium: The atrium was normal in size. - Right atrium: The atrium was mildly dilated. - Tricuspid valve: There was moderate regurgitation. - Pulmonic valve: There was no regurgitation. - Pulmonary arteries: Systolic pressure was moderately increased. PA peak pressure: 50 mm Hg (S). - Inferior vena cava: The vessel was dilated. The respirophasic diameter changes were blunted (<50%), consistent with elevated central venous pressure. - Pericardium, extracardiac: There was no pericardial effusion.    Patient Profile     82 y.o. male with past medical history of CAD, s/p CABG, chronic combined systolic/diastolic congestive heart failure, ischemic cardiomyopathy-last EF 35-40%, history  ofVTstatus post ICD, hypertension, diabetes mellitus, chronic stage III kidney disease, and PVDadmitted with continuous chest pain and abdominal pain. CT scan at an outside hospital showed fluid collection around ICD wires.Troponin minimally elevated.   Assessment & Plan     RECTUS SHEATH HEMATOMA:    Status post drainage of the left upper abdominal wall subrectus hematoma.   Drain out.  OK to start Eliquis tonight.  ATRIAL FIB:    This was a new diagnosis this admission but persistent.   On oral amiodarone.  Would send home on 200 mg daily amio.  Plan is to consider cardioversion in 3 - 4 weeks after Eliquis restarted.    CAD:  The patient has no new sypmtoms.  There was a slight troponin elevation but plan to manage medically.    CKD III:   Creat is improved and stable.  Potassium is back down.  No change in therapy.   ACUTE ON CHRONIC CHF:  Lasix decreased to 40 mg two days ago.  This should be increased to 40 BID when he goes home.  He is not on Cozaar but this can be considered at home.  He is not on spironolactone.  Still hold this at discharge.  I will start a low dose of Zebeta and this can be titrated as an out patient.  He will need TOC follow up for med adjustment and labs next week.    DM:    Continue current therapy.     DNR/DNI     For questions or updates, please contact [place] Please consult [email or web address] for contact info under Cardiology/STEMI.   Signed, Minus Breeding, MD  12/15/2017, 8:33 AM

## 2017-12-15 NOTE — Telephone Encounter (Signed)
Pt currently admitted.

## 2017-12-15 NOTE — Progress Notes (Signed)
Pt's grandson Audiological scientist) called and states the pt does not live with him and that the pt lives alone. RN notified case Freight forwarder.

## 2017-12-15 NOTE — Consult Note (Signed)
[facility] Consultation Progress Note    ESTANISLADO SURGEON EQA:[id number] DOB: [birth year] DOA: 12/03/2017 PCP: Cyndi Bender, PA-C   LOS: 12 days   Brief Narrative / Interim history: Vena Victor Suttonis a 82 y.o.malewith Past medical history ofCAD S/P CABG, ischemic cardiomyopathy with chronic combined CHF, chronic kidney disease stage III, arthritis, HTN, chronic respiratory failure on 2 L of oxygen, CVA, PVD, type II DM on chronic insulin. Patient is coming [facility]. Patient presented with complaints of chest pain prior to be musculoskeletal without any benefit from nitroglycerin.CT PE in the other facility was negative for dissection or PE. Patient was transferred to [facility] for further workup for his chest pain as well as the finding of fluid surrounding epicardial pacer. During the stay in the hospital patient continues to have chest pain as well as diffuse abdominal pain.Interventional radiology as well as cardio thoracic surgery was consulted for fluid collection. Patient underwent ultrasound-guided fluid aspiration on 12/05/2017 30 mL fluid brown thick was removed.   On 12/06/17 patient became lethargic and hospitalist was consulted for acute encephalopathy, felt to be medication related after receiving Zanaflex and Cymbalta, which has already resolved. Now on working on labile glucose.  1/25 Drain removed by surgery.     Assessment & Plan: Principal Problem:   Abdominal wall fluid collections Active Problems:   Insulin dependent type 2 diabetes mellitus, controlled (HCC)   Cardiomyopathy, ischemic   Automatic implantable cardioverter-defibrillator in situ   Acute on chronic systolic CHF (congestive heart failure) (HCC)   Essential hypertension   Hx of CABG 1990   OSA (obstructive sleep apnea)   Stage 3 chronic kidney disease (HCC)   Chest pain   Leukocytosis   Atrial fibrillation, persistent ([place])   DNR (do not resuscitate)   Acute  resp failure with hypoxia due to acute on chronic systolic CHF Managed by Cardiology Stable, increased edema/congestion. Per Cardio patient needs Lasix 40 mg BID on discharge.  AKI on CKD  Baseline Cr 1-1.2 Creatine 1.16 Continue to monitor  DM Type II uncontrolled with labile CBGs Patient BG improved.  Increase Lantus 15 units in the am and Tradjenta once daily.  Follow up PCP for BG monitoring  Epicardial pocket wall hematoma  Rectus sheath hematoma  Chest wall pain Per CTTS and Gen Surgery Abx per primary 1/25 drain removed by surgery  A fib with RVR Per primary Cardioversion in 3-4 weeks after Eliquis is restarted  Subjective: Patient was alert sitting in his chair. He states he is ready to go home.   Objective: Vitals:   12/14/17 1100 12/14/17 2022 12/15/17 0500 12/15/17 0900  BP:  (!) 143/55 118/83 (!) 149/81  Pulse:  96 79 80  Resp:  20 18   Temp:  97.8 F (36.6 C) 97.8 F (36.6 C)   TempSrc:  Oral Oral   SpO2: 95% 100% 95%   Weight:   75 kg (165 lb 4.8 oz)   Height:        Intake/Output Summary (Last 24 hours) at 12/15/2017 1052 Last data filed at 12/15/2017 1005 Gross per 24 hour  Intake 2530 ml  Output 1260 ml  Net 1270 ml   Filed Weights   12/13/17 0539 12/14/17 0500 12/15/17 0500  Weight: 73 kg (161 lb) 73.1 kg (161 lb 3.2 oz) 75 kg (165 lb 4.8 oz)    Examination:  Constitutional: NAD Eyes: PERRL, lids and conjunctivae normal ENMT: Mucous membranes are moist. No oropharyngeal exudates Neck: normal, supple, no  masses, no thyromegaly Respiratory: Minor congestion bilateral lungs, no wheezing, +crackles. Normal respiratory effort. No accessory muscle use.  Cardiovascular: Regular rate and rhythm, no murmurs / rubs / gallops. 1+ LE edema. 2+ pedal pulses. Abdomen:  Tenderness at drainage site. Bowel sounds positive.  Musculoskeletal:  No joint deformity upper and lower extremities. No contractures. Normal muscle tone.  Skin: no rashes, lesions,  ulcers.  Neurologic: CN 2-12 grossly intact. Strength 5/5 in all 4.  Psychiatric: Normal judgment and insight. Alert and oriented x 3. Normal mood.    Data Reviewed: I have independently reviewed following labs and imaging studies   CBC: Recent Labs  Lab 12/10/17 0321 12/11/17 0305 12/12/17 0330 12/13/17 0639 12/14/17 0511  WBC 11.2* 10.6* 7.8 14.4* 10.6*  HGB 9.7* 10.3* 9.1* 9.6* 9.5*  HCT 30.4* 32.1* 29.2* 30.8* 30.6*  MCV 84.0 84.9 84.6 85.1 86.4  PLT 182 237 206 301 175   Basic Metabolic Panel: Recent Labs  Lab 12/09/17 1111  12/11/17 0305 12/12/17 0330 12/13/17 0639 12/14/17 0511 12/15/17 0640  NA 130*   < > 137 133* 141 136 136  K 4.0   < > 3.8 5.0 4.0 5.3* 4.8  CL 93*   < > 96* 98* 102 99* 97*  CO2 24   < > 29 23 29 26 27   GLUCOSE 485*   < > 104* 322* 73 312* 247*  BUN 21*   < > 16 19 21* 22* 24*  CREATININE 1.29*   < > 1.24 1.19 1.25* 1.17 1.16  CALCIUM 8.1*   < > 8.5* 8.1* 8.5* 8.1* 8.5*  MG 1.8  --   --   --   --   --   --    < > = values in this interval not displayed.   GFR: Estimated Creatinine Clearance: 50 mL/min (by C-G formula based on SCr of 1.16 mg/dL). Liver Function Tests: No results for input(s): AST, ALT, ALKPHOS, BILITOT, PROT, ALBUMIN in the last 168 hours. No results for input(s): LIPASE, AMYLASE in the last 168 hours. No results for input(s): AMMONIA in the last 168 hours. Coagulation Profile: No results for input(s): INR, PROTIME in the last 168 hours. Cardiac Enzymes: No results for input(s): CKTOTAL, CKMB, CKMBINDEX, TROPONINI in the last 168 hours. BNP (last 3 results) No results for input(s): PROBNP in the last 8760 hours. HbA1C: No results for input(s): HGBA1C in the last 72 hours. CBG: Recent Labs  Lab 12/14/17 1614 12/14/17 1658 12/14/17 1745 12/14/17 2102 12/15/17 0713  GLUCAP 57* 42* 80 255* 250*   Lipid Profile: No results for input(s): CHOL, HDL, LDLCALC, TRIG, CHOLHDL, LDLDIRECT in the last 72 hours. Thyroid  Function Tests: No results for input(s): TSH, T4TOTAL, FREET4, T3FREE, THYROIDAB in the last 72 hours. Anemia Panel: No results for input(s): VITAMINB12, FOLATE, FERRITIN, TIBC, IRON, RETICCTPCT in the last 72 hours. Urine analysis:    Component Value Date/Time   COLORURINE YELLOW 12/05/2017 1200   APPEARANCEUR HAZY (A) 12/05/2017 1200   LABSPEC 1.012 12/05/2017 1200   PHURINE 5.0 12/05/2017 1200   GLUCOSEU 150 (A) 12/05/2017 1200   HGBUR NEGATIVE 12/05/2017 1200   BILIRUBINUR NEGATIVE 12/05/2017 1200   KETONESUR NEGATIVE 12/05/2017 1200   PROTEINUR 30 (A) 12/05/2017 1200   NITRITE NEGATIVE 12/05/2017 1200   LEUKOCYTESUR NEGATIVE 12/05/2017 1200   Sepsis Labs: Invalid input(s): PROCALCITONIN, LACTICIDVEN  Recent Results (from the past 240 hour(s))  Body fluid culture     Status: None   Collection Time: 12/05/17  3:35 PM  Result Value Ref Range Status   Specimen Description FLUID ABDOMEN  Final   Special Requests Normal  Final   Gram Stain NO WBC SEEN NO ORGANISMS SEEN   Final   Culture No growth aerobically or anaerobically.  Final   Report Status 12/09/2017 FINAL  Final  Culture, blood (x 2)     Status: None   Collection Time: 12/06/17  8:21 PM  Result Value Ref Range Status   Specimen Description BLOOD HAND LEFT  Final   Special Requests IN PEDIATRIC BOTTLE Blood Culture adequate volume  Final   Culture NO GROWTH 5 DAYS  Final   Report Status 12/11/2017 FINAL  Final  Culture, blood (x 2)     Status: None   Collection Time: 12/06/17  8:22 PM  Result Value Ref Range Status   Specimen Description BLOOD ARM RIGHT  Final   Special Requests IN PEDIATRIC BOTTLE Blood Culture adequate volume  Final   Culture NO GROWTH 5 DAYS  Final   Report Status 12/11/2017 FINAL  Final  MRSA PCR Screening     Status: None   Collection Time: 12/06/17  8:50 PM  Result Value Ref Range Status   MRSA by PCR NEGATIVE NEGATIVE Final    Comment:        The GeneXpert MRSA Assay (FDA approved  for NASAL specimens only), is one component of a comprehensive MRSA colonization surveillance program. It is not intended to diagnose MRSA infection nor to guide or monitor treatment for MRSA infections.   Culture, Urine     Status: None   Collection Time: 12/06/17  9:43 PM  Result Value Ref Range Status   Specimen Description URINE, RANDOM  Final   Special Requests NONE  Final   Culture NO GROWTH  Final   Report Status 12/08/2017 FINAL  Final  Anaerobic culture     Status: None (Preliminary result)   Collection Time: 12/11/17 12:47 PM  Result Value Ref Range Status   Specimen Description WOUND LEFT RECTUS HEMATOMA  Final   Special Requests PATIENT ON FOLLOWING ZINACEF  Final   Culture   Final    NO ANAEROBES ISOLATED; CULTURE IN PROGRESS FOR 5 DAYS   Report Status PENDING  Incomplete  Aerobic Culture (superficial specimen)     Status: None   Collection Time: 12/11/17 12:47 PM  Result Value Ref Range Status   Specimen Description WOUND LEFT RECTUS HEMATOMA  Final   Special Requests PATIENT ON FOLLOWING ZINACEF  Final   Gram Stain NO WBC SEEN NO ORGANISMS SEEN   Final   Culture NO GROWTH 3 DAYS  Final   Report Status 12/14/2017 FINAL  Final      Radiology Studies: No results found.   Scheduled Meds: . amiodarone  200 mg Oral BID  . apixaban  5 mg Oral BID  . furosemide  40 mg Oral Daily  . insulin glargine  10 Units Subcutaneous Daily  . lidocaine  2 patch Transdermal Q24H  . linagliptin  5 mg Oral Daily  . metoprolol tartrate  25 mg Oral QID  . pravastatin  40 mg Oral q1800   Continuous Infusions: . sodium chloride 50 mL/hr at 12/14/17 1805  . cefUROXime (ZINACEF)  IV Stopped (12/15/17 1004)       Time spent:     Karmello Abercrombie Kathlen Mody, PA-S

## 2017-12-15 NOTE — Progress Notes (Signed)
MD states pt will stay the night and be discharged tomorrow.

## 2017-12-15 NOTE — Telephone Encounter (Signed)
TOC Patient-Please call Patient-Patient has an appointment on 12-22-17.

## 2017-12-15 NOTE — Progress Notes (Signed)
Pt complained of SOB and SP02 did decrease , once he sat up he did feel better.  He is on home 02.   Lungs with rales Heart RRR His wt is up 7 lbs since admit and he is + 3,706 ml  Will check CXR and I have ordered 40 mg IV lasix.  Will see how he does, he would still like to go home.

## 2017-12-15 NOTE — Care Management Important Message (Signed)
Important Message  Patient Details  Name: Victor [name] MRN: [id number] Date of Birth: Jun 25, 1936   Medicare Important Message Given:  Yes    Carles Collet, RN 12/15/2017, 11:32 AM

## 2017-12-15 NOTE — Progress Notes (Signed)
Pt states he is having difficulty breathing. Pt's SpO2= 89% on 2L nasal cannula. RN notified MD. MD states pt can be changed to 3L oxygen nasal cannula, but no higher than 3L. Pt's SpO2 on 3L nasal cannula= 92%. MD states pt can be saline locked.

## 2017-12-16 LAB — GLUCOSE, CAPILLARY: GLUCOSE-CAPILLARY: 164 mg/dL — AB (ref 65–99)

## 2017-12-16 NOTE — Progress Notes (Signed)
Breakfast tray ordered for patient.

## 2017-12-16 NOTE — Progress Notes (Addendum)
Per Deborah,CM  Rep from [facility] is en route with pt's home O2.   9:00AM   9:14 AM O2 at bedside.

## 2017-12-16 NOTE — Progress Notes (Addendum)
NCM notified Stanton Kidney with [facility] that patient is discharging today.  NCM notified Jermaine with [facility] to bring oxygen tank to patient's room and NCM gave patient eliquies 30 day coupon.

## 2017-12-16 NOTE — Progress Notes (Signed)
Progress Note  Patient Name: Victor [name] Date of Encounter: 12/16/2017  Primary Cardiologist: Peter Martinique, MD   Subjective   Patient was for discharge yesterday, had SOB and was kept overnight. Thought to be fluid overload. SOB resolved this AM  Inpatient Medications    Scheduled Meds: . amiodarone  200 mg Oral BID  . apixaban  5 mg Oral BID  . furosemide  40 mg Oral Daily  . insulin glargine  10 Units Subcutaneous Daily  . lidocaine  2 patch Transdermal Q24H  . linagliptin  5 mg Oral Daily  . metoprolol tartrate  25 mg Oral QID  . pravastatin  40 mg Oral q1800   Continuous Infusions:  PRN Meds: acetaminophen, levalbuterol, methocarbamol, nitroGLYCERIN, ondansetron (ZOFRAN) IV, traMADol, traZODone   Vital Signs    Vitals:   12/15/17 1814 12/15/17 2052 12/15/17 2119 12/16/17 0516  BP: (!) 126/55 121/60 115/75 (!) 127/93  Pulse: 80 61 72 70  Resp:   18 20  Temp:   97.8 F (36.6 C) 97.8 F (36.6 C)  TempSrc:   Oral Oral  SpO2:   100% 99%  Weight:    161 lb 6.4 oz (73.2 kg)  Height:        Intake/Output Summary (Last 24 hours) at 12/16/2017 0904 Last data filed at 12/16/2017 0700 Gross per 24 hour  Intake 720 ml  Output 1975 ml  Net -1255 ml   Filed Weights   12/14/17 0500 12/15/17 0500 12/16/17 0516  Weight: 161 lb 3.2 oz (73.1 kg) 165 lb 4.8 oz (75 kg) 161 lb 6.4 oz (73.2 kg)    Telemetry    afib - Personally Reviewed  ECG    n/a  Physical Exam   GEN: No acute distress.   Neck: No JVD Cardiac: irreg, 2/6 systolic murmur at apex  Respiratory: Clear to auscultation bilaterally. GI: Soft, nontender, non-distended  MS: No edema; No deformity. Neuro:  Nonfocal  Psych: Normal affect   Labs    Chemistry Recent Labs  Lab 12/13/17 0639 12/14/17 0511 12/15/17 0640  NA 141 136 136  K 4.0 5.3* 4.8  CL 102 99* 97*  CO2 29 26 27   GLUCOSE 73 312* 247*  BUN 21* 22* 24*  CREATININE 1.25* 1.17 1.16  CALCIUM 8.5* 8.1* 8.5*  GFRNONAA 53* 57*  58*  GFRAA >60 >60 >60  ANIONGAP 10 11 12      Hematology Recent Labs  Lab 12/12/17 0330 12/13/17 0639 12/14/17 0511  WBC 7.8 14.4* 10.6*  RBC 3.45* 3.62* 3.54*  HGB 9.1* 9.6* 9.5*  HCT 29.2* 30.8* 30.6*  MCV 84.6 85.1 86.4  MCH 26.4 26.5 26.8  MCHC 31.2 31.2 31.0  RDW 14.5 14.3 14.8  PLT 206 301 282    Cardiac EnzymesNo results for input(s): TROPONINI in the last 168 hours. No results for input(s): TROPIPOC in the last 168 hours.   BNPNo results for input(s): BNP, PROBNP in the last 168 hours.   DDimer No results for input(s): DDIMER in the last 168 hours.   Radiology    Dg Chest Port 1 View  Result Date: 12/15/2017 CLINICAL DATA:  Shortness of breath.  History of AFib. EXAM: PORTABLE CHEST 1 VIEW COMPARISON:  12/09/2017 FINDINGS: Stable postsurgical changes from CABG. Stable transvenous and direct epicardial pacer leads. Enlarged cardiac silhouette. Mediastinal contours appear intact. Calcific atherosclerotic disease of the aorta. There is no evidence of pneumothorax. No frank pulmonary edema. Low lung volumes with mild prominence of the interstitium. Persistent  peribronchial airspace opacities in the left lower lobe. Possible small left pleural effusion. Osseous structures are without acute abnormality. Soft tissues are grossly normal. IMPRESSION: Low lung volumes with mild pulmonary vascular congestion. Enlarged cardiac silhouette. Persistent left lower lobe peribronchial airspace consolidation versus atelectasis. Possible small left pleural effusion. Electronically Signed   By: Fidela Salisbury M.D.   On: 12/15/2017 12:01    Cardiac Studies     Patient Profile     82 y.o. male withpast medical history of CAD, s/p CABG, chronic combined systolic/diastolic congestive heart failure, ischemic cardiomyopathy-last EF 35-40%, history ofVTstatus post ICD, hypertension, diabetes mellitus, chronic stage III kidney disease, and PVDadmitted with continuous chest pain and  abdominal pain. CT scan at an outside hospital showed fluid collection around ICD wires.Troponin minimally elevated.    Assessment & Plan    1. Rectus sheath hematoma - s/p drainage, cleared to restart eliquis  2. Afib - new diagnosis this admission - on oral amiodarone, consider cardioversion at 3 weeks of anticoag  3. CAD - no acute issues  4. Acute on chronic systolic HF - Jan 0051 echo LVEF 30-35% - patient was planned for discharge yesterday by increased SOB. He had been on IVFs, +3.7 liters this admit. CXR showed pulm congestion. Was given IV lasix 40mg    For questions or updates, please contact [place] Please consult [email or web address] for contact info under Cardiology/STEMI.      [name], MD  12/16/2017, 9:04 AM

## 2017-12-16 NOTE — Progress Notes (Signed)
Call placed to [place], who is en route to [place].  She mentions working on [employer]; she is going to bedside to discuss oxygen needs .

## 2017-12-16 NOTE — Discharge Summary (Signed)
Discharge Summary    Patient ID: Victor [name],  MRN: [id number], DOB/AGE: 06-27-1936 82 y.o.  Admit date: 12/03/2017 Discharge date: 12/15/2017  Primary Care Provider: Cyndi Bender Primary Cardiologist: Peter Martinique, MD  Discharge Diagnoses    Principal Problem:   Abdominal wall fluid collections Active Problems:   Atrial fibrillation, persistent (HCC)   Insulin dependent type 2 diabetes mellitus, controlled ([place])   Cardiomyopathy, ischemic   Automatic implantable cardioverter-defibrillator in situ   Acute on chronic systolic CHF (congestive heart failure) (HCC)   Essential hypertension   Hx of CABG 1990   OSA (obstructive sleep apnea)   Stage 3 chronic kidney disease ([place])   Chest pain   Leukocytosis   DNR (do not resuscitate)   Debilitated   COPD (chronic obstructive pulmonary disease) ([place])   Allergies      Allergies  Allergen Reactions  . Coreg [Carvedilol] Other (See Comments)    Shortness of breath ,fatigue ,dizzyness   . Lovastatin Other (See Comments)    CK elevation, Myalgias  . Ativan [Lorazepam] Other (See Comments)    Pt. had side effects    Diagnostic Studies/Procedures    12/07/17 Echo  Study Conclusions  - Left ventricle: The cavity size was normal. Systolic function was moderately to severely reduced. The estimated ejection fraction was in the range of 30% to 35%. Diffuse hypokinesis. - Ventricular septum: Septal motion showed paradox. - Aortic valve: There was no regurgitation. - Mitral valve: Calcified annulus. Mildly thickened leaflets . There was mild regurgitation. - Left atrium: The atrium was normal in size. - Right atrium: The atrium was mildly dilated. - Tricuspid valve: There was moderate regurgitation. - Pulmonic valve: There was no regurgitation. - Pulmonary arteries: Systolic pressure was moderately increased. PA peak pressure: 50 mm Hg (S). - Inferior vena cava: The vessel was dilated. The  respirophasic diameter changes were blunted (<50%), consistent with elevated central venous pressure. - Pericardium, extracardiac: There was no pericardial effusion.  Impressions:  - Moderate to severe LV systolic dysfunction with diffuse hypokinesis, akinesis of the basal and mid inferior and inferolateral walls and paradoxical septal motion. LVEF 30-35%. RVEF normal, moderate pulmonary hypertension.   History of Present Illness     Victor [name] is a 82 y.o. male with past medical history of CAD (s/p CABG in 1990 with known occlusion of both vein grafts, PCI to SVG-OM3 in 2014 and attempted CTO PCI of RCA 09/2014), chronic combined systoic and diastolic heart failure, ischemic cardiomyopathy with history of VT in 2012 (s/p ICD placement), DM, CKD, HTN, HLD, PAD, and carotid stenosis transferred from [facility] ED on 12/03/2017 forfurther evaluation of chest pain and elevated cardiac enzymes.   Previous recent admission due to shortness of breath WITHOUT chest pain 12/19-12/20 and treated for CHF. At that time, Troponin elevated to 1.7, EF 45-50%. He was managed for CHF, no ischemic eval per Dr. Martinique.  At [facility], got CT C/A/P without dissection/PE. Did note fluid collection or seroma surrounding the wires of the epicardial pacer in the left anterior abdominal wall anterior to the rectus sheath. He was therefore transferred for further evaluation.   Hospital Course     Consultants: Dr. Fernande Boyden and ID-Dr. Megan Salon, [facility].   Pt's troponin was 0.06 and not felt to be acute coronary syndrome and possibly of a MSK etiology.  EP was consulted -CT scan at [facility] demonstrated fluid collection or seroma surrounding epicardial wires from abandoned epicardial system. Ultrasound done here confirmed  fluid collection around epicardial pacing wires. WBC was elevated. ESR was also elevated.  Blood cultures were drawn.  Dr. Prescott Gum and ID-Dr. Megan Salon  were  consulted as well.  ABXs were held.   Felt this was non-infected hematoma around epicardial wires from previous PPM in abdominal cavity.  Surgical drainage was planned and his Eliquis was stopped and IV heparin started.  On 12/11/17 pt underwent irrigation and debridement of chest hematoma.  Pt had JP drain until 12/15/17 and plans to resume the Eliquis this PM the 25th.   He did develop AKI with pk Cr to 2.6 due to ATN,  Cr did decrease with gentle hydration and holding the Lasix and Cozaar. 12/15/17 Cr is 1.16.  Also with some hyperkalemia and this has improved to 4.8.   In regards to his diabetes, his long acting insulin and initially held then resumed once he began eating more. [facility] was consulted to manage the diabetes. Discharged with 15 units in the AM and Tradjenta daily. Along with acute metabolic encephalopathy which resolved by discharge. Felt this was due to increased dose of Cymbalta, Lyrica, Tramadol and Zananflex.     He developed atrial fibrillation on the 16th with abnormal EKG - was not STEMI though EKG read as such, but the a fib distorted the T wave. He was placed on Amiodarone with plan for DCCV in 3-4 weeks after being restarted on Amiodarone.  Acute on chronic systolic HF -pt was diuresed until Cr elevated then held and IV fluids.  12/15/17 pt with SOB and wt up 5 lbs.  40 mg IV lasix given. CXR done. With mild pulmonary congestion. This afternoon he feels much better and would like to go home.  Dr. Percival Spanish agreed.   Debilitation with recommendation for [facility] RN, PT, OT, nurse's aide.  Through [facility]. This was arranged by care manager.   He is on chronic home oxygen for COPD and this was continued during his hospitalization.    Per Dr. Percival Spanish: "Lasix decreased to 40 mgtwo days ago. This should be increased to 40 BID when he goes home. He is not on Cozaar but this can be considered at home. He is not on spironolactone.Still hold this at  discharge.I will start a low dose of Zebeta and this can be titrated as an out patient. He will need TOC follow up for med adjustment and labs next week."    The patient was initially to be discharged on 12/15/2017 but developed worsening dyspnea prior to this. A repeat CXR was obtained and showed mild pulmonary vascular congestion and he was given addition IV Lasix. Breathing was back at baseline the following morning and he was deemed stable for discharge by Dr. Harl Bowie. He will continue on Lasix '40mg'$  BID as an outpatient with repeat BMET at his next follow-up visit. The above summary was completed by Cecilie Kicks, NP with addendums made as needed.    _____________  Discharge Vitals Blood pressure (!) 147/61, pulse 75, temperature 97.8 F (36.6 C), temperature source Oral, resp. rate 14, height '5\' 8"'$  (1.727 m), weight 165 lb 4.8 oz (75 kg), SpO2 100 %.       Filed Weights   12/13/17 0539 12/14/17 0500 12/15/17 0500  Weight: 161 lb (73 kg) 161 lb 3.2 oz (73.1 kg) 165 lb 4.8 oz (75 kg)    Labs & Radiologic Studies    CBC RecentLabs(last2labs)  Recent Labs    12/13/17 0639 12/14/17 0511  WBC 14.4*  10.6*  HGB 9.6* 9.5*  HCT 30.8* 30.6*  MCV 85.1 86.4  PLT 301 282     Basic Metabolic Panel [id number])  Recent Labs    12/14/17 0511 12/15/17 0640  NA 136 136  K 5.3* 4.8  CL 99* 97*  CO2 26 27  GLUCOSE 312* 247*  BUN 22* 24*  CREATININE 1.17 1.16  CALCIUM 8.1* 8.5*     Liver Function Tests RecentLabs(last2labs)  No results for input(s): AST, ALT, ALKPHOS, BILITOT, PROT, ALBUMIN in the last 72 hours.   RecentLabs(last2labs)  No results for input(s): LIPASE, AMYLASE in the last 72 hours.   Cardiac Enzymes RecentLabs(last2labs)  No results for input(s): CKTOTAL, CKMB, CKMBINDEX, TROPONINI in the last 72 hours.   BNP RecentLabs(last2labs)  Invalid input(s): POCBNP   D-Dimer RecentLabs(last2labs)  No results for  input(s): DDIMER in the last 72 hours.   Hemoglobin A1C RecentLabs(last2labs)  No results for input(s): HGBA1C in the last 72 hours.   Fasting Lipid Panel RecentLabs(last2labs)  No results for input(s): CHOL, HDL, LDLCALC, TRIG, CHOLHDL, LDLDIRECT in the last 72 hours.   Thyroid Function Tests  RecentLabs(last2labs)  No results for input(s): TSH, T4TOTAL, T3FREE, THYROIDAB in the last 72 hours.  Invalid input(s): FREET3   _____________   ImagingResults  Dg Chest 2 View  Result Date: 12/03/2017 CLINICAL DATA:  Left upper chest pain.  Previous rib fractures EXAM: CHEST  2 VIEW COMPARISON:  12/02/2017 FINDINGS: CABG. Left AICD remains in place, unchanged. There is cardiomegaly. Low lung volumes. Left base atelectasis. No effusion or pneumothorax. IMPRESSION: Low lung volumes.  Left base atelectasis. Cardiomegaly. Electronically Signed   By: Rolm Baptise M.D.   On: 12/03/2017 07:13   US Abdomen Limited  Result Date: 12/03/2017 CLINICAL DATA:  Left anterior abdominal wall fluid collection seen on CT EXAM: ULTRASOUND ABDOMEN LIMITED COMPARISON:  Chest CT 12/02/2017 FINDINGS: As noted on CT, there is a fluid collection in the anterior abdominal wall surrounding the pacer wires. The fluid collection measures 9.1 x 5.0 x 2.4 cm. Internal echoes noted. IMPRESSION: Complex fluid collection noted around the pacer wires in the anterior abdominal wall measuring up to 9 cm. Electronically Signed   By: Rolm Baptise M.D.   On: 12/03/2017 07:12   US Aspiration  Result Date: 12/05/2017 INDICATION: 82 year old with a fluid collection surrounding epicardial pacer wires in left anterior abdominal wall. EXAM: ULTRASOUND-GUIDED ASPIRATION OF LEFT ABDOMINAL WALL FLUID COLLECTION MEDICATIONS: None ANESTHESIA/SEDATION: None COMPLICATIONS: None immediate. PROCEDURE: Informed written consent was obtained from the patient after a thorough discussion of the procedural risks, benefits and  alternatives. All questions were addressed. A timeout was performed prior to the initiation of the procedure. The fluid collection in the left anterior abdominal wall was identified with ultrasound. This area was prepped with chlorhexidine and sterile field was created. Skin was anesthetized with 1% lidocaine. Using ultrasound guidance, an 18 gauge spinal needle was directed into the collection. Very thick brown fluid was aspirated. Very little fluid could be aspirated from this needle. Therefore, a 5 [place] Yueh catheter was directed into the collection with ultrasound guidance. Again, very thick brown fluid was removed. Approximately 30 mL of fluid was able to be removed. The entire collection could not be decompressed. Yueh catheter was removed. Bandage placed over the puncture site. FINDINGS: Complex fluid collection surrounding epicardial pacer wires in left anterior abdominal wall. Yueh catheter was successfully advanced into the collection and very thick brown fluid was aspirated. 30 mL of fluid was removed.  There was still a large amount of fluid remaining in this collection at the end of the procedure. IMPRESSION: Ultrasound-guided aspiration of the complex fluid collection in the left anterior abdominal wall surrounding epicardial pacer wires. Very thick brown fluid was removed from this collection and sent for culture. Based on the fluid consistency, the patient will likely need surgical intervention to remove all the fluid. Electronically Signed   By: Markus Daft M.D.   On: 12/05/2017 20:24   Dg Chest Port 1 View  Result Date: 12/15/2017 CLINICAL DATA:  Shortness of breath.  History of AFib. EXAM: PORTABLE CHEST 1 VIEW COMPARISON:  12/09/2017 FINDINGS: Stable postsurgical changes from CABG. Stable transvenous and direct epicardial pacer leads. Enlarged cardiac silhouette. Mediastinal contours appear intact. Calcific atherosclerotic disease of the aorta. There is no evidence of pneumothorax. No frank  pulmonary edema. Low lung volumes with mild prominence of the interstitium. Persistent peribronchial airspace opacities in the left lower lobe. Possible small left pleural effusion. Osseous structures are without acute abnormality. Soft tissues are grossly normal. IMPRESSION: Low lung volumes with mild pulmonary vascular congestion. Enlarged cardiac silhouette. Persistent left lower lobe peribronchial airspace consolidation versus atelectasis. Possible small left pleural effusion. Electronically Signed   By: Fidela Salisbury M.D.   On: 12/15/2017 12:01   Dg Chest Port 1 View  Result Date: 12/09/2017 CLINICAL DATA:  Shortness of breath EXAM: PORTABLE CHEST 1 VIEW COMPARISON:  12/06/2017 FINDINGS: Chronic cardiomegaly. Status post CABG. There is trans venous and direct epicardial pacer leads in stable position. Low volume chest with interstitial crowding and asymmetric streaky retrocardiac density. There is no edema, air bronchogram, effusion, or pneumothorax. IMPRESSION: Stable low lung volumes with probable retrocardiac atelectasis. Electronically Signed   By: Monte Fantasia M.D.   On: 12/09/2017 19:48   Dg Chest Port 1 View  Result Date: 12/06/2017 CLINICAL DATA:  Shortness of breath EXAM: PORTABLE CHEST 1 VIEW COMPARISON:  12/03/2017 FINDINGS: Bilateral diffuse mild interstitial thickening. No pleural effusion or pneumothorax. No focal consolidation. Stable cardiomegaly. Prior CABG. Single lead cardiac pacemaker. No acute osseous abnormality. IMPRESSION: Cardiomegaly with pulmonary vascular congestion. Electronically Signed   By: Kathreen Devoid   On: 12/06/2017 20:18    Disposition   Pt is being discharged home today in good condition.  Follow-up Plans & Appointments       Follow-up Information    Ivin Poot, MD Follow up.   Specialty:  Cardiothoracic Surgery Why:  His office will call you for follow up appt.   Contact information: [address] [place]  [place] [address] [phone]        Martinique, Peter M, MD Follow up on 12/22/2017.   Specialty:  Cardiology Why:  at 9:45am WITH his PA [facility] information: [address] [place] [address] [phone]        Cyndi Bender, PA-C Follow up.   Specialty:  Physician Assistant Why:  Call Monday to be seen next week for your diabetes. Contact information: [place] [address] [phone]           Heart healthy low salt diabetic diet  Home oxygen as before.  Weigh daily call if wt increases 3 lbs in a day or 5 lbs in a week.  We adjusted your medications, please follow the instructions of when you leave the hospital   Discharge Medications   Allergies as of 12/15/2017      Reactions   Coreg [carvedilol]  Other (See Comments)   Shortness of breath ,fatigue ,dizzyness    Lovastatin Other (See Comments)   CK elevation, Myalgias   Ativan [lorazepam] Other (See Comments)   Pt. had side effects              Medication List     STOP taking these medications   ASPIR-81 81 MG EC tablet Generic drug:  aspirin   clopidogrel 75 MG tablet Commonly known as:  PLAVIX   dexamethasone 4 MG tablet Commonly known as:  DECADRON   HUMALOG KWIKPEN 100 UNIT/ML KiwkPen Generic drug:  insulin lispro   LEVEMIR FLEXTOUCH 100 UNIT/ML Pen Generic drug:  Insulin Detemir   losartan 50 MG tablet Commonly known as:  COZAAR   LYRICA 150 MG capsule Generic drug:  pregabalin   mupirocin ointment 2 % Commonly known as:  BACTROBAN   spironolactone 25 MG tablet Commonly known as:  ALDACTONE     TAKE these medications   acetaminophen 325 MG tablet Commonly known as:  TYLENOL Take 2 tablets (650 mg total) by mouth every 4 (four) hours as needed for headache or mild pain.   amiodarone 200 MG tablet Commonly known as:  PACERONE Take 1 tablet (200 mg total) by mouth daily.   apixaban 5 MG Tabs  tablet Commonly known as:  ELIQUIS Take 1 tablet (5 mg total) by mouth 2 (two) times daily.   bisoprolol 5 MG tablet Commonly known as:  ZEBETA Take 0.5 tablets (2.5 mg total) by mouth daily.   CALCIUM PO Take 1 tablet by mouth at bedtime.   docusate sodium 100 MG capsule Commonly known as:  COLACE Take 1 capsule (100 mg total) by mouth at bedtime.   DULoxetine 30 MG capsule Commonly known as:  CYMBALTA Take 30 mg by mouth daily.   furosemide 40 MG tablet Commonly known as:  LASIX Take 1 tablet (40 mg total) by mouth 2 (two) times daily.   insulin glargine 100 UNIT/ML injection Commonly known as:  LANTUS Inject 0.1 mLs (10 Units total) into the skin daily. Start taking on:  12/16/2017   lidocaine 5 % Commonly known as:  LIDODERM Place 2 patches onto the skin daily. Remove & Discard patch within 12 hours or as directed by MD Start taking on:  12/16/2017   linagliptin 5 MG Tabs tablet Commonly known as:  TRADJENTA Take 1 tablet (5 mg total) by mouth daily. Start taking on:  12/16/2017   methocarbamol 750 MG tablet Commonly known as:  ROBAXIN Take 1 tablet (750 mg total) by mouth every 8 (eight) hours as needed for muscle spasms.   nitroGLYCERIN 0.4 MG SL tablet Commonly known as:  NITROSTAT Place 0.4 mg under the tongue every 5 (five) minutes as needed for chest pain.   omeprazole 20 MG capsule Commonly known as:  PRILOSEC Take 20 mg by mouth every morning.   pravastatin 40 MG tablet Commonly known as:  PRAVACHOL Take 1 tablet (40 mg total) by mouth daily at 6 PM.   PRESCRIPTION MEDICATION Apply 1 application topically as needed (Neck Pain).   traMADol 50 MG tablet Commonly known as:  ULTRAM Take 50 mg by mouth 3 (three) times daily as needed for pain.   traZODone 50 MG tablet Commonly known as:  DESYREL Take 1 tablet by mouth at bedtime.   VITAMIN B-12 PO Take 1 tablet by mouth daily.          Outstanding Labs/Studies   BMP at  follow-up visit  Duration of Discharge Encounter   Greater than 30 minutes including physician time.  Signed, Cecilie Kicks NP 12/15/2017, 4:08 PM

## 2017-12-18 DIAGNOSIS — N183 Chronic kidney disease, stage 3 (moderate): Secondary | ICD-10-CM | POA: Diagnosis not present

## 2017-12-18 DIAGNOSIS — E1151 Type 2 diabetes mellitus with diabetic peripheral angiopathy without gangrene: Secondary | ICD-10-CM | POA: Diagnosis not present

## 2017-12-18 DIAGNOSIS — E1122 Type 2 diabetes mellitus with diabetic chronic kidney disease: Secondary | ICD-10-CM | POA: Diagnosis not present

## 2017-12-18 DIAGNOSIS — I481 Persistent atrial fibrillation: Secondary | ICD-10-CM | POA: Diagnosis not present

## 2017-12-18 DIAGNOSIS — I5043 Acute on chronic combined systolic (congestive) and diastolic (congestive) heart failure: Secondary | ICD-10-CM | POA: Diagnosis not present

## 2017-12-18 DIAGNOSIS — I130 Hypertensive heart and chronic kidney disease with heart failure and stage 1 through stage 4 chronic kidney disease, or unspecified chronic kidney disease: Secondary | ICD-10-CM | POA: Diagnosis not present

## 2017-12-18 NOTE — Telephone Encounter (Signed)
Left message for pt to call.

## 2017-12-18 NOTE — Telephone Encounter (Signed)
No answer when called and no voicemail has been set up.

## 2017-12-19 LAB — ANAEROBIC CULTURE

## 2017-12-19 NOTE — Telephone Encounter (Signed)
Left message for pt to call.

## 2017-12-20 DIAGNOSIS — E1122 Type 2 diabetes mellitus with diabetic chronic kidney disease: Secondary | ICD-10-CM | POA: Diagnosis not present

## 2017-12-20 DIAGNOSIS — N183 Chronic kidney disease, stage 3 (moderate): Secondary | ICD-10-CM | POA: Diagnosis not present

## 2017-12-20 DIAGNOSIS — I5043 Acute on chronic combined systolic (congestive) and diastolic (congestive) heart failure: Secondary | ICD-10-CM | POA: Diagnosis not present

## 2017-12-20 DIAGNOSIS — I130 Hypertensive heart and chronic kidney disease with heart failure and stage 1 through stage 4 chronic kidney disease, or unspecified chronic kidney disease: Secondary | ICD-10-CM | POA: Diagnosis not present

## 2017-12-20 DIAGNOSIS — E1151 Type 2 diabetes mellitus with diabetic peripheral angiopathy without gangrene: Secondary | ICD-10-CM | POA: Diagnosis not present

## 2017-12-20 DIAGNOSIS — I481 Persistent atrial fibrillation: Secondary | ICD-10-CM | POA: Diagnosis not present

## 2017-12-20 NOTE — Telephone Encounter (Signed)
TOC attempt # 3 - no VM set up PAOV on Dec 22, 2017

## 2017-12-21 DIAGNOSIS — I130 Hypertensive heart and chronic kidney disease with heart failure and stage 1 through stage 4 chronic kidney disease, or unspecified chronic kidney disease: Secondary | ICD-10-CM | POA: Diagnosis not present

## 2017-12-21 DIAGNOSIS — I481 Persistent atrial fibrillation: Secondary | ICD-10-CM | POA: Diagnosis not present

## 2017-12-21 DIAGNOSIS — N183 Chronic kidney disease, stage 3 (moderate): Secondary | ICD-10-CM | POA: Diagnosis not present

## 2017-12-21 DIAGNOSIS — I5043 Acute on chronic combined systolic (congestive) and diastolic (congestive) heart failure: Secondary | ICD-10-CM | POA: Diagnosis not present

## 2017-12-21 DIAGNOSIS — E1122 Type 2 diabetes mellitus with diabetic chronic kidney disease: Secondary | ICD-10-CM | POA: Diagnosis not present

## 2017-12-21 DIAGNOSIS — E1151 Type 2 diabetes mellitus with diabetic peripheral angiopathy without gangrene: Secondary | ICD-10-CM | POA: Diagnosis not present

## 2017-12-22 ENCOUNTER — Ambulatory Visit: Payer: Medicare Other | Admitting: Physician Assistant

## 2017-12-22 NOTE — Progress Notes (Deleted)
Cardiology Office Note    Date:  12/22/2017   ID:  CHELSEY KIMBERLEY, DOB 1936/10/29, MRN [id number]  PCP:  Cyndi Bender, PA-C  Cardiologist:  ***   No chief complaint on file.   History of Present Illness:  Victor [name] is a 82 y.o. male with PMH of CAD s/p CABG 1990 (known occlusion of both vein grafts, PCI to SVG-OM3 2014, attempted CTO PCI of RCA 09/2014), ICM with h/o VT s/p ICD, chronic combined systolic and diastolic heart failure, HTN, HLD, PAD, carotid artery disease and DM II. Patient was admitted in April 2016 with acute heart failure exacerbation.  He had new EKG changes and underwent cardiac catheterization with unsuccessful repeat attempt at PCI of CTO RCA.  Medical therapy was continued.  There was attempt to put him on Entresto in the past, however he was unable to tolerate due to hypotension.  His last episode of VT was in 2012 and underwent [place] by Dr. Lovena Le afterward.  He was admitted at [facility] in June 2018 for CHF exacerbation.  ProBNP was greater than 6000 at the time.  Echo showed a stable EF 35%.  He had a fall and was admitted in the hospital in [facility].  He was admitted for acute CHF exacerbation in August, multiple falls and hyponatremia with acute kidney injury.  Creatinine rose to 2.37.  Myoglobin was down to 7.4.  He was transfused 1 unit of blood.  He underwent IV diuresis.  Echo was unchanged.  ARB/ACE inhibitor was held.  He was eventually transitioned to 40 mg twice daily of Lasix with losartan 25 mg daily on discharge.  Most recently he came to the hospital on 11/08/2017 with shortness of breath, but no chest pain.  Troponin was found to be elevated at 1.5.  Echo showed EF was improved to 45-50%, moderate MR and mild AS.  He initially went to outside hospital but was later transferred to [facility].  He underwent IV diuresis with improvement of his symptoms.  Although previously Aldactone was held due to  hypotension, it was restarted during this admission along with 40 mg twice daily of Lasix.  Most recently, patient was transferred from [facility] to [facility] on 12/03/2017 after he presented with chest pain and the found to have elevated troponin. His chest pain and elevated troponin was felt not to be ACS but possibly MSK etiology.  CT angiogram was negative for dissection/PE, however did notice fluid accumulation or seroma (2.6 x8.7 cm loculated collection) surrounding the wires of epicardial pacer.  White blood cell and ESR were elevated.  Dr. Darcey Nora and infectious disease were also consulted as well.  It was felt that this is likely noninfected hematoma around the epicardial wire from the previous PPM in the abdominal cavity.  Surgical drainage was planned.  He underwent irrigation and debridement of the chest hematoma on 12/11/2017 after temporarily holding Eliquis.  He had a JP drain placed until 12/15/2017 and Eliquis was resumed on the same night.  Hospital course was complicated by AK I secondary to ATN with peak creatinine of 2.6.  Renal function did improve with gentle hydration and holding of Lasix and Cozaar.  He did develop atrial fibrillation on 12/06/2016, he was placed on amiodarone with plan for DCCV in 3-4 weeks after restarting Eliquis.  He was discharged on Lasix 40 mg twice daily.  Yes EKG Consider add Cozaar BMET and CBC   Past  Medical History:  Diagnosis Date  . Anemia   . Arthritis   . Atrial fibrillation, persistent ([place]) 12/15/2017  . Cardiomyopathy, ischemic    a. s/p ICD placement  . Chronic combined systolic and diastolic CHF (congestive heart failure) ([place])   . CKD (chronic kidney disease), stage III ([place])   . COPD (chronic obstructive pulmonary disease) ([place])   . Coronary artery disease    a. s/p CABG 1990. b. multiple PCIs since that time including CTO angioplasty (without stenting) PCI of RCA 09/2014, with last cath 02/2015 done for worsening CHF  with unsuccessful attempt at PCI of the RCA due to inability to cross occlusion - medical therapy advised at that time.  . Debilitated 12/15/2017  . GERD (gastroesophageal reflux disease)   . HLD (hyperlipidemia)   . Hypertension   . Occlusion and stenosis of carotid artery without mention of cerebral infarction   . On home oxygen therapy    "2L; 24/7" (11/08/2017)  . Orthostatic hypotension   . Paroxysmal ventricular tachycardia ([place])   . Peripheral vascular disease ([place])   . Raynaud's syndrome   . Stroke [facility])    a. CT head 06/2015: Small old lacunar infarct LEFT basal ganglia.  . Thrombocytopenia ([place])   . TIA (transient ischemic attack)    a. s/p LHC on 10/01/14   . Type II diabetes mellitus ([place])     Past Surgical History:  Procedure Laterality Date  . ANAL FISTULECTOMY  2000's  . CARDIAC CATHETERIZATION  10/01/2014   PTVA to CTO with restoration of TIMI 3  . CATARACT EXTRACTION, BILATERAL Bilateral ~ 2009  . CHEST EXPLORATION N/A 12/11/2017   Procedure: IRRIGATION AND DEBRIDEMENT OF CHEST HEMATOMA;  Surgeon: Ivin Poot, MD;  Location: [place];  Service: Thoracic;  Laterality: N/A;  . CORONARY ARTERY BYPASS GRAFT  01/1989   "CABG X3"   . FINGER FRACTURE SURGERY Left ~ 2000   "pointer"  . IMPLANTABLE CARDIOVERTER DEFIBRILLATOR (ICD) GENERATOR CHANGE Left 10/05/2011   Procedure: ICD GENERATOR CHANGE;  Surgeon: Evans Lance, MD;  Location: [facility] CATH LAB;  Service: Cardiovascular;  Laterality: Left;  . LEFT AND RIGHT HEART CATHETERIZATION WITH CORONARY/GRAFT ANGIOGRAM N/A 04/26/2013   Procedure: LEFT AND RIGHT HEART CATHETERIZATION WITH Beatrix Fetters;  Surgeon: Sherren Mocha, MD;  Location: [facility] CATH LAB;  Service: Cardiovascular;  Laterality: N/A;  . LEFT HEART CATHETERIZATION WITH CORONARY ANGIOGRAM N/A 08/01/2014   Procedure: LEFT HEART CATHETERIZATION WITH CORONARY ANGIOGRAM;  Surgeon: Sinclair Grooms, MD;  Location: [facility] CATH LAB;  Service: Cardiovascular;   Laterality: N/A;  . LEFT HEART CATHETERIZATION WITH CORONARY ANGIOGRAM N/A 03/20/2015   Procedure: LEFT HEART CATHETERIZATION WITH CORONARY ANGIOGRAM;  Surgeon: Peter M Martinique, MD;  Location: [facility] CATH LAB;  Service: Cardiovascular;  Laterality: N/A;  . PERCUTANEOUS STENT INTERVENTION  04/26/2013   Procedure: PERCUTANEOUS STENT INTERVENTION;  Surgeon: Sherren Mocha, MD;  Location: [facility] CATH LAB;  Service: Cardiovascular;;  . RIGHT HEART CATHETERIZATION  10/01/2014   Procedure: RIGHT HEART CATH;  Surgeon: Peter M Martinique, MD;  Location: [facility] CATH LAB;  Service: Cardiovascular;;    Current Medications: Outpatient Medications Prior to Visit  Medication Sig Dispense Refill  . acetaminophen (TYLENOL) 325 MG tablet Take 2 tablets (650 mg total) by mouth every 4 (four) hours as needed for headache or mild pain.    Marland Kitchen amiodarone (PACERONE) 200 MG tablet Take 1 tablet (200 mg total) by mouth daily. 30 tablet 6  . apixaban (ELIQUIS) 5 MG TABS tablet  Take 1 tablet (5 mg total) by mouth 2 (two) times daily. 60 tablet 6  . bisoprolol (ZEBETA) 5 MG tablet Take 0.5 tablets (2.5 mg total) by mouth daily. 90 tablet 3  . CALCIUM PO Take 1 tablet by mouth at bedtime.    . Cyanocobalamin (VITAMIN B-12 PO) Take 1 tablet by mouth daily.     Marland Kitchen docusate sodium (COLACE) 100 MG capsule Take 1 capsule (100 mg total) by mouth at bedtime. (Patient not taking: Reported on 12/03/2017) 10 capsule 0  . DULoxetine (CYMBALTA) 30 MG capsule Take 30 mg by mouth daily.   3  . furosemide (LASIX) 40 MG tablet Take 1 tablet (40 mg total) by mouth 2 (two) times daily. 60 tablet 6  . insulin glargine (LANTUS) 100 UNIT/ML injection Inject 0.1 mLs (10 Units total) into the skin daily. 10 mL 11  . lidocaine (LIDODERM) 5 % Place 2 patches onto the skin daily. Remove & Discard patch within 12 hours or as directed by MD 30 patch 0  . linagliptin (TRADJENTA) 5 MG TABS tablet Take 1 tablet (5 mg total) by mouth daily. 30 tablet 1  . methocarbamol  (ROBAXIN) 750 MG tablet Take 1 tablet (750 mg total) by mouth every 8 (eight) hours as needed for muscle spasms. 30 tablet 1  . nitroGLYCERIN (NITROSTAT) 0.4 MG SL tablet Place 0.4 mg under the tongue every 5 (five) minutes as needed for chest pain.    Marland Kitchen omeprazole (PRILOSEC) 20 MG capsule Take 20 mg by mouth every morning.  7  . pravastatin (PRAVACHOL) 40 MG tablet Take 1 tablet (40 mg total) by mouth daily at 6 PM. 30 tablet 0  . PRESCRIPTION MEDICATION Apply 1 application topically as needed (Neck Pain).    . traMADol (ULTRAM) 50 MG tablet Take 50 mg by mouth 3 (three) times daily as needed for pain.  2  . traZODone (DESYREL) 50 MG tablet Take 1 tablet by mouth at bedtime.  1   No facility-administered medications prior to visit.      Allergies:   Coreg [carvedilol]; Lovastatin; and Ativan [lorazepam]   Social History   Socioeconomic History  . Marital status: Widowed    Spouse name: Not on file  . Number of children: Not on file  . Years of education: Not on file  . Highest education level: Not on file  Social Needs  . Financial resource strain: Not on file  . Food insecurity - worry: Not on file  . Food insecurity - inability: Not on file  . Transportation needs - medical: Not on file  . Transportation needs - non-medical: Not on file  Occupational History  . Not on file  Tobacco Use  . Smoking status: Former Smoker    Years: 3.00    Types: Pipe  . Smokeless tobacco: Never Used  . Tobacco comment: "quit smoking pipe in early 1970's"  Substance and Sexual Activity  . Alcohol use: Yes    Comment: 11/08/2017 "wine w/dinner maybe once/yr; or less"  . Drug use: No  . Sexual activity: No  Other Topics Concern  . 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 Has 3 daughters. 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      Family History:  The patient's ***family history includes Heart  attack in his father; Heart failure (age of onset: 61) in his mother; Leukemia in  his sister; Stomach cancer in his maternal grandfather.   ROS:   Please see the history of present illness.    ROS All other systems reviewed and are negative.   PHYSICAL EXAM:   VS:  There were no vitals taken for this visit.   GEN: Well nourished, well developed, in no acute distress  HEENT: normal  Neck: no JVD, carotid bruits, or masses Cardiac: ***RRR; no murmurs, rubs, or gallops,no edema  Respiratory:  clear to auscultation bilaterally, normal work of breathing GI: soft, nontender, nondistended, + BS MS: no deformity or atrophy  Skin: warm and dry, no rash Neuro:  Alert and Oriented x 3, Strength and sensation are intact Psych: euthymic mood, full affect  Wt Readings from Last 3 Encounters:  12/16/17 161 lb 6.4 oz (73.2 kg)  11/09/17 161 lb 9.6 oz (73.3 kg)  09/14/17 164 lb 3.2 oz (74.5 kg)      Studies/Labs Reviewed:   EKG:  EKG is*** ordered today.  The ekg ordered today demonstrates ***  Recent Labs: 11/08/2017: B Natriuretic Peptide 796.6 12/06/2017: ALT 33 12/07/2017: TSH 0.411 12/09/2017: Magnesium 1.8 12/14/2017: Hemoglobin 9.5; Platelets 282 12/15/2017: BUN 24; Creatinine, Ser 1.16; Potassium 4.8; Sodium 136   Lipid Panel    Component Value Date/Time   CHOL 127 06/29/2015 0508   TRIG 111 06/29/2015 0508   HDL 36 (L) 06/29/2015 0508   CHOLHDL 3.5 06/29/2015 0508   VLDL 22 06/29/2015 0508   LDLCALC 69 06/29/2015 0508    Additional studies/ records that were reviewed today include:  ***    ASSESSMENT:    No diagnosis found.   PLAN:  In order of problems listed above:  1. ***    Medication Adjustments/Labs and Tests Ordered: Current medicines are reviewed at length with the patient today.  Concerns regarding medicines are outlined above.  Medication changes, Labs and Tests ordered today are listed in the Patient Instructions below. There are no Patient  Instructions on file for this visit.   Hilbert Corrigan, [place]  12/22/2017 9:17 AM    [address], [place], [place]  [address] Phone: [phone]; Fax: [phone]

## 2017-12-25 DIAGNOSIS — I130 Hypertensive heart and chronic kidney disease with heart failure and stage 1 through stage 4 chronic kidney disease, or unspecified chronic kidney disease: Secondary | ICD-10-CM | POA: Diagnosis not present

## 2017-12-25 DIAGNOSIS — E1151 Type 2 diabetes mellitus with diabetic peripheral angiopathy without gangrene: Secondary | ICD-10-CM | POA: Diagnosis not present

## 2017-12-25 DIAGNOSIS — E1149 Type 2 diabetes mellitus with other diabetic neurological complication: Secondary | ICD-10-CM | POA: Diagnosis not present

## 2017-12-25 DIAGNOSIS — I481 Persistent atrial fibrillation: Secondary | ICD-10-CM | POA: Diagnosis not present

## 2017-12-25 DIAGNOSIS — S20219A Contusion of unspecified front wall of thorax, initial encounter: Secondary | ICD-10-CM | POA: Diagnosis not present

## 2017-12-25 DIAGNOSIS — J9611 Chronic respiratory failure with hypoxia: Secondary | ICD-10-CM | POA: Diagnosis not present

## 2017-12-25 DIAGNOSIS — R630 Anorexia: Secondary | ICD-10-CM | POA: Diagnosis not present

## 2017-12-25 DIAGNOSIS — N183 Chronic kidney disease, stage 3 (moderate): Secondary | ICD-10-CM | POA: Diagnosis not present

## 2017-12-25 DIAGNOSIS — I4891 Unspecified atrial fibrillation: Secondary | ICD-10-CM | POA: Diagnosis not present

## 2017-12-25 DIAGNOSIS — E1122 Type 2 diabetes mellitus with diabetic chronic kidney disease: Secondary | ICD-10-CM | POA: Diagnosis not present

## 2017-12-25 DIAGNOSIS — M5136 Other intervertebral disc degeneration, lumbar region: Secondary | ICD-10-CM | POA: Diagnosis not present

## 2017-12-25 DIAGNOSIS — L03311 Cellulitis of abdominal wall: Secondary | ICD-10-CM | POA: Diagnosis not present

## 2017-12-25 DIAGNOSIS — I509 Heart failure, unspecified: Secondary | ICD-10-CM | POA: Diagnosis not present

## 2017-12-25 DIAGNOSIS — Z79899 Other long term (current) drug therapy: Secondary | ICD-10-CM | POA: Diagnosis not present

## 2017-12-25 DIAGNOSIS — I5043 Acute on chronic combined systolic (congestive) and diastolic (congestive) heart failure: Secondary | ICD-10-CM | POA: Diagnosis not present

## 2017-12-26 ENCOUNTER — Emergency Department (HOSPITAL_COMMUNITY): Payer: Medicare Other

## 2017-12-26 ENCOUNTER — Other Ambulatory Visit: Payer: Self-pay

## 2017-12-26 ENCOUNTER — Inpatient Hospital Stay (HOSPITAL_COMMUNITY)
Admission: EM | Admit: 2017-12-26 | Discharge: 2018-01-10 | DRG: 260 | Disposition: A | Discharge status: Skilled Nursing Facility | Payer: Medicare Other | Attending: Internal Medicine | Admitting: Internal Medicine

## 2017-12-26 DIAGNOSIS — Z800 Family history of malignant neoplasm of digestive organs: Secondary | ICD-10-CM

## 2017-12-26 DIAGNOSIS — R0602 Shortness of breath: Secondary | ICD-10-CM | POA: Diagnosis not present

## 2017-12-26 DIAGNOSIS — I739 Peripheral vascular disease, unspecified: Secondary | ICD-10-CM | POA: Diagnosis present

## 2017-12-26 DIAGNOSIS — Z794 Long term (current) use of insulin: Secondary | ICD-10-CM | POA: Diagnosis not present

## 2017-12-26 DIAGNOSIS — D62 Acute posthemorrhagic anemia: Secondary | ICD-10-CM | POA: Diagnosis not present

## 2017-12-26 DIAGNOSIS — I10 Essential (primary) hypertension: Secondary | ICD-10-CM | POA: Diagnosis not present

## 2017-12-26 DIAGNOSIS — T827XXD Infection and inflammatory reaction due to other cardiac and vascular devices, implants and grafts, subsequent encounter: Secondary | ICD-10-CM | POA: Diagnosis not present

## 2017-12-26 DIAGNOSIS — Z9981 Dependence on supplemental oxygen: Secondary | ICD-10-CM

## 2017-12-26 DIAGNOSIS — K8020 Calculus of gallbladder without cholecystitis without obstruction: Secondary | ICD-10-CM | POA: Diagnosis not present

## 2017-12-26 DIAGNOSIS — Z95810 Presence of automatic (implantable) cardiac defibrillator: Secondary | ICD-10-CM

## 2017-12-26 DIAGNOSIS — S025XXA Fracture of tooth (traumatic), initial encounter for closed fracture: Secondary | ICD-10-CM | POA: Diagnosis present

## 2017-12-26 DIAGNOSIS — R296 Repeated falls: Secondary | ICD-10-CM | POA: Diagnosis not present

## 2017-12-26 DIAGNOSIS — G4733 Obstructive sleep apnea (adult) (pediatric): Secondary | ICD-10-CM | POA: Diagnosis not present

## 2017-12-26 DIAGNOSIS — Z8249 Family history of ischemic heart disease and other diseases of the circulatory system: Secondary | ICD-10-CM

## 2017-12-26 DIAGNOSIS — L03311 Cellulitis of abdominal wall: Secondary | ICD-10-CM | POA: Diagnosis not present

## 2017-12-26 DIAGNOSIS — I255 Ischemic cardiomyopathy: Secondary | ICD-10-CM | POA: Diagnosis not present

## 2017-12-26 DIAGNOSIS — T8140XA Infection following a procedure, unspecified, initial encounter: Secondary | ICD-10-CM | POA: Diagnosis not present

## 2017-12-26 DIAGNOSIS — E1122 Type 2 diabetes mellitus with diabetic chronic kidney disease: Secondary | ICD-10-CM | POA: Diagnosis present

## 2017-12-26 DIAGNOSIS — D631 Anemia in chronic kidney disease: Secondary | ICD-10-CM | POA: Diagnosis not present

## 2017-12-26 DIAGNOSIS — Z87891 Personal history of nicotine dependence: Secondary | ICD-10-CM

## 2017-12-26 DIAGNOSIS — B9561 Methicillin susceptible Staphylococcus aureus infection as the cause of diseases classified elsewhere: Secondary | ICD-10-CM | POA: Diagnosis present

## 2017-12-26 DIAGNOSIS — I340 Nonrheumatic mitral (valve) insufficiency: Secondary | ICD-10-CM | POA: Diagnosis not present

## 2017-12-26 DIAGNOSIS — N179 Acute kidney failure, unspecified: Secondary | ICD-10-CM | POA: Diagnosis present

## 2017-12-26 DIAGNOSIS — L0291 Cutaneous abscess, unspecified: Secondary | ICD-10-CM | POA: Diagnosis not present

## 2017-12-26 DIAGNOSIS — N183 Chronic kidney disease, stage 3 unspecified: Secondary | ICD-10-CM | POA: Diagnosis present

## 2017-12-26 DIAGNOSIS — K5900 Constipation, unspecified: Secondary | ICD-10-CM | POA: Diagnosis present

## 2017-12-26 DIAGNOSIS — D649 Anemia, unspecified: Secondary | ICD-10-CM | POA: Diagnosis present

## 2017-12-26 DIAGNOSIS — F339 Major depressive disorder, recurrent, unspecified: Secondary | ICD-10-CM | POA: Diagnosis not present

## 2017-12-26 DIAGNOSIS — T827XXA Infection and inflammatory reaction due to other cardiac and vascular devices, implants and grafts, initial encounter: Secondary | ICD-10-CM | POA: Diagnosis not present

## 2017-12-26 DIAGNOSIS — I7300 Raynaud's syndrome without gangrene: Secondary | ICD-10-CM | POA: Diagnosis present

## 2017-12-26 DIAGNOSIS — Z8673 Personal history of transient ischemic attack (TIA), and cerebral infarction without residual deficits: Secondary | ICD-10-CM

## 2017-12-26 DIAGNOSIS — I481 Persistent atrial fibrillation: Secondary | ICD-10-CM | POA: Diagnosis not present

## 2017-12-26 DIAGNOSIS — E119 Type 2 diabetes mellitus without complications: Secondary | ICD-10-CM | POA: Diagnosis not present

## 2017-12-26 DIAGNOSIS — D696 Thrombocytopenia, unspecified: Secondary | ICD-10-CM | POA: Diagnosis not present

## 2017-12-26 DIAGNOSIS — R100 Acute abdomen: Secondary | ICD-10-CM | POA: Diagnosis not present

## 2017-12-26 DIAGNOSIS — J439 Emphysema, unspecified: Secondary | ICD-10-CM | POA: Diagnosis not present

## 2017-12-26 DIAGNOSIS — I130 Hypertensive heart and chronic kidney disease with heart failure and stage 1 through stage 4 chronic kidney disease, or unspecified chronic kidney disease: Secondary | ICD-10-CM | POA: Diagnosis not present

## 2017-12-26 DIAGNOSIS — I252 Old myocardial infarction: Secondary | ICD-10-CM

## 2017-12-26 DIAGNOSIS — I482 Chronic atrial fibrillation: Secondary | ICD-10-CM | POA: Diagnosis not present

## 2017-12-26 DIAGNOSIS — M6281 Muscle weakness (generalized): Secondary | ICD-10-CM | POA: Diagnosis not present

## 2017-12-26 DIAGNOSIS — X58XXXA Exposure to other specified factors, initial encounter: Secondary | ICD-10-CM | POA: Diagnosis present

## 2017-12-26 DIAGNOSIS — K219 Gastro-esophageal reflux disease without esophagitis: Secondary | ICD-10-CM | POA: Diagnosis present

## 2017-12-26 DIAGNOSIS — I440 Atrioventricular block, first degree: Secondary | ICD-10-CM | POA: Diagnosis present

## 2017-12-26 DIAGNOSIS — R011 Cardiac murmur, unspecified: Secondary | ICD-10-CM | POA: Diagnosis not present

## 2017-12-26 DIAGNOSIS — Z951 Presence of aortocoronary bypass graft: Secondary | ICD-10-CM

## 2017-12-26 DIAGNOSIS — Z66 Do not resuscitate: Secondary | ICD-10-CM | POA: Diagnosis not present

## 2017-12-26 DIAGNOSIS — R7881 Bacteremia: Secondary | ICD-10-CM | POA: Diagnosis not present

## 2017-12-26 DIAGNOSIS — I2510 Atherosclerotic heart disease of native coronary artery without angina pectoris: Secondary | ICD-10-CM | POA: Diagnosis present

## 2017-12-26 DIAGNOSIS — Z7901 Long term (current) use of anticoagulants: Secondary | ICD-10-CM

## 2017-12-26 DIAGNOSIS — T827XXS Infection and inflammatory reaction due to other cardiac and vascular devices, implants and grafts, sequela: Secondary | ICD-10-CM | POA: Diagnosis not present

## 2017-12-26 DIAGNOSIS — Z9841 Cataract extraction status, right eye: Secondary | ICD-10-CM

## 2017-12-26 DIAGNOSIS — R1030 Lower abdominal pain, unspecified: Secondary | ICD-10-CM | POA: Diagnosis not present

## 2017-12-26 DIAGNOSIS — E785 Hyperlipidemia, unspecified: Secondary | ICD-10-CM | POA: Diagnosis present

## 2017-12-26 DIAGNOSIS — I110 Hypertensive heart disease with heart failure: Secondary | ICD-10-CM | POA: Diagnosis not present

## 2017-12-26 DIAGNOSIS — I5043 Acute on chronic combined systolic (congestive) and diastolic (congestive) heart failure: Secondary | ICD-10-CM | POA: Diagnosis not present

## 2017-12-26 DIAGNOSIS — Z4502 Encounter for adjustment and management of automatic implantable cardiac defibrillator: Secondary | ICD-10-CM | POA: Diagnosis not present

## 2017-12-26 DIAGNOSIS — J449 Chronic obstructive pulmonary disease, unspecified: Secondary | ICD-10-CM | POA: Diagnosis present

## 2017-12-26 DIAGNOSIS — R51 Headache: Secondary | ICD-10-CM | POA: Diagnosis not present

## 2017-12-26 DIAGNOSIS — E1129 Type 2 diabetes mellitus with other diabetic kidney complication: Secondary | ICD-10-CM

## 2017-12-26 DIAGNOSIS — Z806 Family history of leukemia: Secondary | ICD-10-CM

## 2017-12-26 DIAGNOSIS — I4819 Other persistent atrial fibrillation: Secondary | ICD-10-CM | POA: Diagnosis present

## 2017-12-26 DIAGNOSIS — L02211 Cutaneous abscess of abdominal wall: Secondary | ICD-10-CM | POA: Diagnosis present

## 2017-12-26 DIAGNOSIS — I7389 Other specified peripheral vascular diseases: Secondary | ICD-10-CM | POA: Diagnosis not present

## 2017-12-26 DIAGNOSIS — A4901 Methicillin susceptible Staphylococcus aureus infection, unspecified site: Secondary | ICD-10-CM | POA: Diagnosis not present

## 2017-12-26 DIAGNOSIS — I509 Heart failure, unspecified: Secondary | ICD-10-CM | POA: Diagnosis not present

## 2017-12-26 DIAGNOSIS — Z79899 Other long term (current) drug therapy: Secondary | ICD-10-CM

## 2017-12-26 DIAGNOSIS — Z978 Presence of other specified devices: Secondary | ICD-10-CM | POA: Diagnosis not present

## 2017-12-26 DIAGNOSIS — Z888 Allergy status to other drugs, medicaments and biological substances status: Secondary | ICD-10-CM

## 2017-12-26 DIAGNOSIS — Z9842 Cataract extraction status, left eye: Secondary | ICD-10-CM

## 2017-12-26 DIAGNOSIS — R001 Bradycardia, unspecified: Secondary | ICD-10-CM | POA: Diagnosis not present

## 2017-12-26 LAB — COMPREHENSIVE METABOLIC PANEL
ALK PHOS: 100 U/L (ref 38–126)
ALT: 14 U/L — ABNORMAL LOW (ref 17–63)
AST: 22 U/L (ref 15–41)
Albumin: 2.4 g/dL — ABNORMAL LOW (ref 3.5–5.0)
Anion gap: 15 (ref 5–15)
BUN: 25 mg/dL — AB (ref 6–20)
CALCIUM: 8.5 mg/dL — AB (ref 8.9–10.3)
CO2: 34 mmol/L — AB (ref 22–32)
CREATININE: 1.76 mg/dL — AB (ref 0.61–1.24)
Chloride: 88 mmol/L — ABNORMAL LOW (ref 101–111)
GFR calc non Af Amer: 35 mL/min — ABNORMAL LOW (ref >60)
GFR, EST AFRICAN AMERICAN: 41 mL/min — AB (ref >60)
Glucose, Bld: 234 mg/dL — ABNORMAL HIGH (ref 65–99)
Potassium: 3.6 mmol/L (ref 3.5–5.1)
SODIUM: 137 mmol/L (ref 135–145)
Total Bilirubin: 0.8 mg/dL (ref 0.3–1.2)
Total Protein: 6.1 g/dL — ABNORMAL LOW (ref 6.5–8.1)

## 2017-12-26 LAB — CBC WITH DIFFERENTIAL/PLATELET
BASOS PCT: 0 %
Basophils Absolute: 0 10*3/uL (ref 0.0–0.1)
EOS ABS: 0.1 10*3/uL (ref 0.0–0.7)
EOS PCT: 1 %
HCT: 29.6 % — ABNORMAL LOW (ref 39.0–52.0)
HEMOGLOBIN: 9 g/dL — AB (ref 13.0–17.0)
LYMPHS ABS: 0.9 10*3/uL (ref 0.7–4.0)
Lymphocytes Relative: 9 %
MCH: 25.2 pg — AB (ref 26.0–34.0)
MCHC: 30.4 g/dL (ref 30.0–36.0)
MCV: 82.9 fL (ref 78.0–100.0)
MONO ABS: 0.8 10*3/uL (ref 0.1–1.0)
MONOS PCT: 7 %
NEUTROS PCT: 83 %
Neutro Abs: 8.9 10*3/uL — ABNORMAL HIGH (ref 1.7–7.7)
PLATELETS: 181 10*3/uL (ref 150–400)
RBC: 3.57 MIL/uL — ABNORMAL LOW (ref 4.22–5.81)
RDW: 14.8 % (ref 11.5–15.5)
WBC: 10.8 10*3/uL — ABNORMAL HIGH (ref 4.0–10.5)

## 2017-12-26 LAB — I-STAT CG4 LACTIC ACID, ED
LACTIC ACID, VENOUS: 1.31 mmol/L (ref 0.5–1.9)
Lactic Acid, Venous: 0.91 mmol/L (ref 0.5–1.9)

## 2017-12-26 LAB — HEMOGLOBIN A1C
Hgb A1c MFr Bld: 8.6 % — ABNORMAL HIGH (ref 4.8–5.6)
Mean Plasma Glucose: 200.12 mg/dL

## 2017-12-26 LAB — CBG MONITORING, ED: GLUCOSE-CAPILLARY: 150 mg/dL — AB (ref 65–99)

## 2017-12-26 MED ORDER — PANTOPRAZOLE SODIUM 40 MG PO TBEC
40.0000 mg | DELAYED_RELEASE_TABLET | Freq: Every day | ORAL | Status: DC
  Administered 2017-12-26 – 2018-01-10 (×15): 40 mg via ORAL
  Filled 2017-12-26 (×15): qty 1

## 2017-12-26 MED ORDER — DULOXETINE HCL 30 MG PO CPEP
30.0000 mg | ORAL_CAPSULE | Freq: Every day | ORAL | Status: DC
  Administered 2017-12-26 – 2018-01-10 (×15): 30 mg via ORAL
  Filled 2017-12-26 (×15): qty 1

## 2017-12-26 MED ORDER — SODIUM CHLORIDE 0.9 % IV BOLUS (SEPSIS)
500.0000 mL | Freq: Once | INTRAVENOUS | Status: AC
  Administered 2017-12-26: 500 mL via INTRAVENOUS

## 2017-12-26 MED ORDER — PIPERACILLIN-TAZOBACTAM 3.375 G IVPB
3.3750 g | Freq: Three times a day (TID) | INTRAVENOUS | Status: DC
  Administered 2017-12-26 – 2017-12-30 (×13): 3.375 g via INTRAVENOUS
  Filled 2017-12-26 (×16): qty 50

## 2017-12-26 MED ORDER — INSULIN ASPART 100 UNIT/ML ~~LOC~~ SOLN
0.0000 [IU] | SUBCUTANEOUS | Status: DC
  Administered 2017-12-26: 1 [IU] via SUBCUTANEOUS
  Administered 2017-12-27 (×2): 2 [IU] via SUBCUTANEOUS
  Administered 2017-12-27 (×2): 1 [IU] via SUBCUTANEOUS
  Administered 2017-12-27: 2 [IU] via SUBCUTANEOUS
  Administered 2017-12-28 (×4): 1 [IU] via SUBCUTANEOUS
  Administered 2017-12-30: 5 [IU] via SUBCUTANEOUS
  Administered 2017-12-30: 2 [IU] via SUBCUTANEOUS
  Administered 2017-12-30: 5 [IU] via SUBCUTANEOUS
  Administered 2017-12-30: 3 [IU] via SUBCUTANEOUS
  Administered 2017-12-30: 5 [IU] via SUBCUTANEOUS
  Administered 2017-12-30: 9 [IU] via SUBCUTANEOUS
  Administered 2017-12-30: 2 [IU] via SUBCUTANEOUS
  Administered 2017-12-31: 1 [IU] via SUBCUTANEOUS
  Administered 2017-12-31: 5 [IU] via SUBCUTANEOUS
  Administered 2017-12-31: 3 [IU] via SUBCUTANEOUS
  Administered 2017-12-31: 1 [IU] via SUBCUTANEOUS

## 2017-12-26 MED ORDER — INSULIN GLARGINE 100 UNIT/ML ~~LOC~~ SOLN
5.0000 [IU] | Freq: Every day | SUBCUTANEOUS | Status: DC
  Administered 2017-12-27 – 2017-12-31 (×5): 5 [IU] via SUBCUTANEOUS
  Filled 2017-12-26 (×8): qty 0.05

## 2017-12-26 MED ORDER — PIPERACILLIN-TAZOBACTAM 3.375 G IVPB: 3.3750 g | Freq: Three times a day (TID) | INTRAVENOUS | Status: DC

## 2017-12-26 MED ORDER — PRAVASTATIN SODIUM 40 MG PO TABS
40.0000 mg | ORAL_TABLET | Freq: Every day | ORAL | Status: DC
  Administered 2017-12-26 – 2018-01-10 (×15): 40 mg via ORAL
  Filled 2017-12-26 (×15): qty 1

## 2017-12-26 MED ORDER — ONDANSETRON HCL 4 MG/2ML IJ SOLN
4.0000 mg | Freq: Four times a day (QID) | INTRAMUSCULAR | Status: DC | PRN
  Administered 2018-01-05: 4 mg via INTRAVENOUS
  Filled 2017-12-26: qty 2

## 2017-12-26 MED ORDER — VANCOMYCIN HCL IN DEXTROSE 750-5 MG/150ML-% IV SOLN
750.0000 mg | INTRAVENOUS | Status: DC
  Administered 2017-12-27 – 2017-12-28 (×2): 750 mg via INTRAVENOUS
  Filled 2017-12-26 (×3): qty 150

## 2017-12-26 MED ORDER — FLEET ENEMA 7-19 GM/118ML RE ENEM
1.0000 | ENEMA | Freq: Once | RECTAL | Status: AC
  Administered 2017-12-27: 1 via RECTAL
  Filled 2017-12-26: qty 1

## 2017-12-26 MED ORDER — ACETAMINOPHEN 325 MG PO TABS: 650.0000 mg | ORAL_TABLET | Freq: Four times a day (QID) | ORAL | Status: DC | PRN

## 2017-12-26 MED ORDER — METHOCARBAMOL 500 MG PO TABS
750.0000 mg | ORAL_TABLET | Freq: Three times a day (TID) | ORAL | Status: DC | PRN
  Administered 2017-12-29 – 2018-01-10 (×8): 750 mg via ORAL
  Filled 2017-12-26 (×8): qty 2

## 2017-12-26 MED ORDER — PIPERACILLIN-TAZOBACTAM 3.375 G IVPB 30 MIN
3.3750 g | Freq: Once | INTRAVENOUS | Status: DC
  Administered 2017-12-26: 3.375 g via INTRAVENOUS
  Filled 2017-12-26: qty 50

## 2017-12-26 MED ORDER — ACETAMINOPHEN 650 MG RE SUPP: 650.0000 mg | Freq: Four times a day (QID) | RECTAL | Status: DC | PRN

## 2017-12-26 MED ORDER — VANCOMYCIN HCL IN DEXTROSE 1-5 GM/200ML-% IV SOLN: 1000.0000 mg | Freq: Once | INTRAVENOUS | Status: DC

## 2017-12-26 MED ORDER — SODIUM CHLORIDE 0.9 % IV SOLN
1500.0000 mg | Freq: Once | INTRAVENOUS | Status: AC
  Administered 2017-12-26: 1500 mg via INTRAVENOUS
  Filled 2017-12-26: qty 1500

## 2017-12-26 MED ORDER — IOPAMIDOL (ISOVUE-300) INJECTION 61%
INTRAVENOUS | Status: AC
  Administered 2017-12-26: 75 mL
  Filled 2017-12-26: qty 75

## 2017-12-26 MED ORDER — BISOPROLOL FUMARATE 5 MG PO TABS
2.5000 mg | ORAL_TABLET | Freq: Every day | ORAL | Status: DC
  Administered 2017-12-26 – 2018-01-10 (×15): 2.5 mg via ORAL
  Filled 2017-12-26 (×4): qty 1
  Filled 2017-12-26: qty 0.5
  Filled 2017-12-26 (×5): qty 1
  Filled 2017-12-26: qty 0.5
  Filled 2017-12-26: qty 1
  Filled 2017-12-26 (×2): qty 0.5
  Filled 2017-12-26 (×2): qty 1

## 2017-12-26 MED ORDER — HYDROCODONE-ACETAMINOPHEN 5-325 MG PO TABS
1.0000 | ORAL_TABLET | ORAL | Status: DC | PRN
  Administered 2017-12-26: 1 via ORAL
  Administered 2017-12-27 – 2018-01-10 (×44): 2 via ORAL
  Filled 2017-12-26 (×18): qty 2
  Filled 2017-12-26: qty 1
  Filled 2017-12-26 (×9): qty 2
  Filled 2017-12-26: qty 1
  Filled 2017-12-26 (×5): qty 2
  Filled 2017-12-26: qty 1
  Filled 2017-12-26 (×11): qty 2

## 2017-12-26 MED ORDER — AMIODARONE HCL 200 MG PO TABS
200.0000 mg | ORAL_TABLET | Freq: Every day | ORAL | Status: DC
  Administered 2017-12-26 – 2018-01-10 (×15): 200 mg via ORAL
  Filled 2017-12-26 (×15): qty 1

## 2017-12-26 MED ORDER — NITROGLYCERIN 0.4 MG SL SUBL: 0.4000 mg | SUBLINGUAL_TABLET | SUBLINGUAL | Status: DC | PRN

## 2017-12-26 MED ORDER — ONDANSETRON HCL 4 MG PO TABS: 4.0000 mg | ORAL_TABLET | Freq: Four times a day (QID) | ORAL | Status: DC | PRN

## 2017-12-26 MED ORDER — SENNOSIDES-DOCUSATE SODIUM 8.6-50 MG PO TABS
1.0000 | ORAL_TABLET | Freq: Two times a day (BID) | ORAL | Status: DC
  Administered 2017-12-26 – 2018-01-10 (×22): 1 via ORAL
  Filled 2017-12-26 (×26): qty 1

## 2017-12-26 MED ORDER — POLYETHYLENE GLYCOL 3350 17 G PO PACK: 17.0000 g | PACK | Freq: Every day | ORAL | Status: DC | PRN

## 2017-12-26 NOTE — ED Notes (Signed)
[name] Combs is son and would like to be notified when a decision is made. Had to go back to work. Call at [phone] or [phone]

## 2017-12-26 NOTE — ED Notes (Signed)
Zosyn started on patient and then discontinued by MD after started. New order placed with no changes to old order. Per Pharmacy, continue with original infusion.

## 2017-12-26 NOTE — ED Triage Notes (Signed)
Patient states he had a left abd hematoma drained 10 days ago. 2 day ago patient noticed redness, pus, and pain and went to PCP. Antibiotics prescribed and wound cleaned. This am patient states pain woke him up with fresh redness and pus in wound area.

## 2017-12-26 NOTE — H&P (Signed)
History and Physical        Hospital Admission Note Date: 12/26/2017  Patient name: Victor [name] Medical record number: [id number] Date of birth: December 28, 1935 Age: 82 y.o. Gender: male  PCP: Cyndi Bender, PA-C    Patient coming from: Home  I have reviewed all records in the [facility].    Chief Complaint:  Pus draining from the abdominal wound  HPI: Patient is a 82 year old male with history of CAD status post CABG in 1990, chronic combined systolic and diastolic CHF, ischemic cardiomyopathy, history of VT in 2012, status post ICD, diabetes, insulin-dependent, CKD hypertension, hyperlipidemia who was recently discharged on 12/16/17 by cardiology service.  Patient was found to have fluid collection surrounding the epicardial wires from an abandoned epicardial system.  On 12/11/17, patient underwent irrigation and debridement of the hematoma, JP drain until 1/25 and subsequently Eliquis was resumed on discharge.  Patient also developed atrial fibrillation during hospitalization, was placed on amiodarone with plan for DCCV in 3-4 weeks.  Patient was diuresed for acute on chronic systolic CHF. Patient was not discharged on antibiotics as it was felt that it was a noninfected hematoma around the epicardial wires from previous PPM and abdominal cavity. Patient reported that he was in his baseline state of health once he got home however a couple of days later he started noticing increased redness and pain in the same left lower quadrant abdominal area which was drained earlier.  Patient went to his PCP, per patient the wound was cleaned and he received "shot of antibiotic".  Patient reports that the wound continued to progressively get larger in size, more tender and red.  This morning he woke up with pain 9/10 in the left lower quadrant area and it had "ruptured" and pus was coming out.   Per patient antibiotics were started on him yesterday, he had subjective fevers at home, loss of appetite.  He also reports constipation and has not had a BM in last 2 weeks.   ED work-up/course:  Respiratory rate 19, BP 125/72, O2 sats 98% on 3 L Sodium 137, potassium 3.6, BUN 25, creatinine 1.76.  Creatinine was 1.1 on 1/25 and patient was discharged. WBC count 10.8, hemoglobin 9.0, platelets 181 CT abdomen showed 8.8x 3.0 cm fluid collection in the subcutaneous tissue of the left lower quadrant abdomen surrounding the epicardial leads, inflammatory changes noted around this concerning for abscess.   Review of Systems: Positives marked in 'bold' Constitutional: + fever, chills, diaphoresis, poor appetite and fatigue.  HEENT: Denies photophobia, eye pain, redness, hearing loss, ear pain, congestion, sore throat, rhinorrhea, sneezing, mouth sores, trouble swallowing, neck pain, neck stiffness and tinnitus.   Respiratory: Denies SOB, DOE, cough, chest tightness,  and wheezing.   Cardiovascular: Denies chest pain, palpitations and leg swelling.  Gastrointestinal: Denies nausea, vomiting, abdominal pain, constipation, blood in stool and abdominal distention.  Genitourinary: Denies dysuria, urgency, frequency, hematuria, flank pain and difficulty urinating.  Musculoskeletal: Denies myalgias, back pain, joint swelling, arthralgias and gait problem.  Skin: Please see HPI Neurological: Denies dizziness, seizures, syncope, weakness, light-headedness, numbness and headaches.  Hematological: Denies adenopathy. Easy bruising, personal or family bleeding history  Psychiatric/Behavioral:  Denies suicidal ideation, mood changes, confusion, nervousness, sleep disturbance and agitation  Past Medical History: Past Medical History:  Diagnosis Date  . Anemia   . Arthritis   . Atrial fibrillation, persistent ([place]) 12/15/2017  . Cardiomyopathy, ischemic    a. s/p ICD placement  . Chronic combined systolic  and diastolic CHF (congestive heart failure) ([place])   . CKD (chronic kidney disease), stage III ([place])   . COPD (chronic obstructive pulmonary disease) ([place])   . Coronary artery disease    a. s/p CABG 1990. b. multiple PCIs since that time including CTO angioplasty (without stenting) PCI of RCA 09/2014, with last cath 02/2015 done for worsening CHF with unsuccessful attempt at PCI of the RCA due to inability to cross occlusion - medical therapy advised at that time.  . Debilitated 12/15/2017  . GERD (gastroesophageal reflux disease)   . HLD (hyperlipidemia)   . Hypertension   . Occlusion and stenosis of carotid artery without mention of cerebral infarction   . On home oxygen therapy    "2L; 24/7" (11/08/2017)  . Orthostatic hypotension   . Paroxysmal ventricular tachycardia ([place])   . Peripheral vascular disease ([place])   . Raynaud's syndrome   . Stroke [facility])    a. CT head 06/2015: Small old lacunar infarct LEFT basal ganglia.  . Thrombocytopenia ([place])   . TIA (transient ischemic attack)    a. s/p LHC on 10/01/14   . Type II diabetes mellitus ([place])     Past Surgical History:  Procedure Laterality Date  . ANAL FISTULECTOMY  2000's  . CARDIAC CATHETERIZATION  10/01/2014   PTVA to CTO with restoration of TIMI 3  . CATARACT EXTRACTION, BILATERAL Bilateral ~ 2009  . CHEST EXPLORATION N/A 12/11/2017   Procedure: IRRIGATION AND DEBRIDEMENT OF CHEST HEMATOMA;  Surgeon: Ivin Poot, MD;  Location: [place];  Service: Thoracic;  Laterality: N/A;  . CORONARY ARTERY BYPASS GRAFT  01/1989   "CABG X3"   . FINGER FRACTURE SURGERY Left ~ 2000   "pointer"  . IMPLANTABLE CARDIOVERTER DEFIBRILLATOR (ICD) GENERATOR CHANGE Left 10/05/2011   Procedure: ICD GENERATOR CHANGE;  Surgeon: Evans Lance, MD;  Location: [facility] CATH LAB;  Service: Cardiovascular;  Laterality: Left;  . LEFT AND RIGHT HEART CATHETERIZATION WITH CORONARY/GRAFT ANGIOGRAM N/A 04/26/2013   Procedure: LEFT AND RIGHT HEART CATHETERIZATION WITH  Beatrix Fetters;  Surgeon: Sherren Mocha, MD;  Location: [facility] CATH LAB;  Service: Cardiovascular;  Laterality: N/A;  . LEFT HEART CATHETERIZATION WITH CORONARY ANGIOGRAM N/A 08/01/2014   Procedure: LEFT HEART CATHETERIZATION WITH CORONARY ANGIOGRAM;  Surgeon: Sinclair Grooms, MD;  Location: [facility] CATH LAB;  Service: Cardiovascular;  Laterality: N/A;  . LEFT HEART CATHETERIZATION WITH CORONARY ANGIOGRAM N/A 03/20/2015   Procedure: LEFT HEART CATHETERIZATION WITH CORONARY ANGIOGRAM;  Surgeon: Peter M Martinique, MD;  Location: [facility] CATH LAB;  Service: Cardiovascular;  Laterality: N/A;  . PERCUTANEOUS STENT INTERVENTION  04/26/2013   Procedure: PERCUTANEOUS STENT INTERVENTION;  Surgeon: Sherren Mocha, MD;  Location: [facility] CATH LAB;  Service: Cardiovascular;;  . RIGHT HEART CATHETERIZATION  10/01/2014   Procedure: RIGHT HEART CATH;  Surgeon: Peter M Martinique, MD;  Location[facility] CATH LAB;  Service: Cardiovascular;;    Medications: Prior to Admission medications   Medication Sig Start Date End Date Taking? Authorizing Provider  acetaminophen (TYLENOL) 325 MG tablet Take 2 tablets (650 mg total) by mouth every 4 (four) hours as needed for headache or mild pain. 12/15/17  Yes Isaiah Serge, NP  amiodarone ([place])  200 MG tablet Take 1 tablet (200 mg total) by mouth daily. 12/15/17  Yes Isaiah Serge, NP  apixaban (ELIQUIS) 5 MG TABS tablet Take 1 tablet (5 mg total) by mouth 2 (two) times daily. 12/15/17  Yes Isaiah Serge, NP  bisoprolol (ZEBETA) 5 MG tablet Take 0.5 tablets (2.5 mg total) by mouth daily. 01/27/17  Yes Martinique, Peter M, MD  CALCIUM PO Take 1 tablet by mouth at bedtime.   Yes [provider]  Cyanocobalamin (VITAMIN B-12 PO) Take 1 tablet by mouth daily.    Yes [provider]  docusate sodium (COLACE) 100 MG capsule Take 1 capsule (100 mg total) by mouth at bedtime. 11/09/17  Yes Regalado, Belkys A, MD  DULoxetine (CYMBALTA) 30 MG capsule Take 30 mg by mouth daily.   10/13/17  Yes [provider]  furosemide (LASIX) 40 MG tablet Take 1 tablet (40 mg total) by mouth 2 (two) times daily. 12/15/17 01/14/18 Yes Isaiah Serge, NP  insulin detemir (LEVEMIR) 100 UNIT/ML injection Inject 10 Units into the skin See admin instructions. 10 units in the morning. May increase as needed to keep fasting BS < 130.   Yes [provider]  lidocaine (LIDODERM) 5 % Place 2 patches onto the skin daily. Remove & Discard patch within 12 hours or as directed by MD 12/16/17  Yes Isaiah Serge, NP  linagliptin (TRADJENTA) 5 MG TABS tablet Take 1 tablet (5 mg total) by mouth daily. 12/16/17  Yes Isaiah Serge, NP  methocarbamol (ROBAXIN) 750 MG tablet Take 1 tablet (750 mg total) by mouth every 8 (eight) hours as needed for muscle spasms. 12/15/17  Yes Isaiah Serge, NP  nitroGLYCERIN (NITROSTAT) 0.4 MG SL tablet Place 0.4 mg under the tongue every 5 (five) minutes as needed for chest pain.   Yes [provider]  omeprazole (PRILOSEC) 20 MG capsule Take 20 mg by mouth every morning. 11/29/17  Yes [provider]  pravastatin (PRAVACHOL) 40 MG tablet Take 1 tablet (40 mg total) by mouth daily at 6 PM. 11/09/17  Yes Regalado, Belkys A, MD  traMADol (ULTRAM) 50 MG tablet Take 50 mg by mouth 3 (three) times daily as needed for pain. 10/16/17  Yes [provider]  insulin glargine (LANTUS) 100 UNIT/ML injection Inject 0.1 mLs (10 Units total) into the skin daily. Patient not taking: Reported on 12/26/2017 12/16/17   Isaiah Serge, NP    Allergies:   Allergies  Allergen Reactions  . Coreg [Carvedilol] Other (See Comments)    Shortness of breath ,fatigue ,dizzyness   . Lovastatin Other (See Comments)    CK elevation, Myalgias  . Ativan [Lorazepam] Other (See Comments)    Pt. had side effects    Social History:  reports that he has quit smoking. His smoking use included pipe. He quit after 3.00 years of use. he has never used smokeless  tobacco. He reports that he drinks alcohol. He reports that he does not use drugs.  Family History: Family History  Problem Relation Age of Onset  . Heart failure Mother 15  . Heart attack Father   . Leukemia Sister   . Stomach cancer Maternal Grandfather     Physical Exam: Blood pressure (!) 126/42, pulse 73, resp. rate 13, height 5\' 7"  (1.702 m), weight 68.5 kg (151 lb), SpO2 100 %. General: Alert, awake, oriented x3, in no acute distress. Eyes: pink conjunctiva,anicteric sclera, pupils equal and reactive to light and accomodation, HEENT: normocephalic,  atraumatic, oropharynx clear Neck: supple, no masses or lymphadenopathy, no goiter, no bruits, no JVD CVS: Regular rate and rhythm, without murmurs, rubs or gallops. No lower extremity edema Resp : Clear to auscultation bilaterally, no wheezing, rales or rhonchi. GI : Soft, nondistended, positive bowel sounds, no masses.  Tender and indurated area in the left lower quadrant Musculoskeletal: No clubbing or cyanosis, positive pedal pulses. No contracture. ROM intact  Neuro: Grossly intact, no focal neurological deficits, strength 5/5 upper and lower extremities bilaterally Psych: alert and oriented x 3, normal mood and affect Skin: Tender, indurated area with erythema at least 10cm x 6cm with a thick whitish pus draining out   LABS on Admission: I have personally reviewed all the labs and imagings below    Basic Metabolic Panel: Recent Labs  Lab 12/26/17 1218  NA 137  K 3.6  CL 88*  CO2 34*  GLUCOSE 234*  BUN 25*  CREATININE 1.76*  CALCIUM 8.5*   Liver Function Tests: Recent Labs  Lab 12/26/17 1218  AST 22  ALT 14*  ALKPHOS 100  BILITOT 0.8  PROT 6.1*  ALBUMIN 2.4*   No results for input(s): LIPASE, AMYLASE in the last 168 hours. No results for input(s): AMMONIA in the last 168 hours. CBC: Recent Labs  Lab 12/26/17 1218  WBC 10.8*  NEUTROABS 8.9*  HGB 9.0*  HCT 29.6*  MCV 82.9  PLT 181   Cardiac  Enzymes: No results for input(s): CKTOTAL, CKMB, CKMBINDEX, TROPONINI in the last 168 hours. BNP: Invalid input(s): POCBNP CBG: No results for input(s): GLUCAP in the last 168 hours.  Radiological Exams on Admission:  Ct Abdomen Pelvis W Contrast  Result Date: 12/26/2017 CLINICAL DATA:  Abdominal pain, fever. EXAM: CT ABDOMEN AND PELVIS WITH CONTRAST TECHNIQUE: Multidetector CT imaging of the abdomen and pelvis was performed using the standard protocol following bolus administration of intravenous contrast. CONTRAST:  5mL ISOVUE-300 IOPAMIDOL (ISOVUE-300) INJECTION 61% COMPARISON:  CT scan of January 12, 2011. Ultrasound of December 05, 2017. FINDINGS: Lower chest: No acute abnormality. Hepatobiliary: Cholelithiasis is noted without inflammation. Liver is unremarkable. Pancreas: Unremarkable. No pancreatic ductal dilatation or surrounding inflammatory changes. Spleen: Normal in size without focal abnormality. Adrenals/Urinary Tract: Adrenal glands appear normal. Small right renal cyst is noted. No hydronephrosis or renal obstruction is noted. No renal or ureteral calculi are noted. Urinary bladder is unremarkable. Stomach/Bowel: Stomach is within normal limits. Appendix appears normal. No evidence of bowel wall thickening, distention, or inflammatory changes. Vascular/Lymphatic: Aortic atherosclerosis. No enlarged abdominal or pelvic lymph nodes. Reproductive: Prostate is unremarkable. Other: There is continued presence of fluid collection seen around epicardial leads in the left lower quadrant of the abdomen this fluid collection currently measures 8.8 x 3.0 cm. Inflammatory changes are now noted around it and this is concerning for abscess. Please refer to laboratory data from ultrasound-guided aspiration performed on January 15. Musculoskeletal: No acute or significant osseous findings. IMPRESSION: 8.8 x 3.0 cm fluid collection is seen in the subcutaneous tissues of the left lower quadrant the abdomen  surrounding epicardial leads. Inflammatory changes noted around this concerning for abscess. This fluid collection was recently aspirated, and correlation with laboratory data is recommended. Cholelithiasis is noted without inflammation. Aortic atherosclerosis. Electronically Signed   By: Marijo Conception, M.D.   On: 12/26/2017 15:04      EKG: Independently reviewed.  Rate 75, sinus rhythm   Assessment/Plan Principal Problem: Left rectus sheath abdominal wall abscess, left lower quadrant -CT abdomen showed 8.8x 3  cm fluid collection in the subcutaneous tissue of the left lower quadrant in the abdomen surrounding epicardial leads concerning for abscess -Patient had I&D of the presumed hematoma on 12/11/17 in the same area, she had a JP drain until 1/25 -Continue NPO status, EDP, Dr. Laverta Baltimore has consulted CT surgery who will evaluate the patient for further management regarding the abscess, previous abscess drained by Dr. Prescott Gum -Placed on IV vancomycin and Zosyn, antibiotics will be adjusted according to the wound cultures.  Previous wound cultures had shown MSSA -Hold Eliquis for possible OR  Active Problems:   Insulin dependent type 2 diabetes mellitus, controlled (HCC) -Continue Lantus, placed on sliding scale insulin -Hemoglobin A1c 8.8 on 1/18    Essential hypertension -Currently stable we will continue bisoprolol -Hold Lasix until creatinine function improves    GERD (gastroesophageal reflux disease) -Continue PPI    Hx of CAD status post CABG 1990, chronic combined systolic and diastolic CHF, ischemic cardiomyopathy -2D echo on 1/17 had shown EF of 30-35% with diffuse hypokinesis, moderate to severe LV systolic dysfunction. -Currently no chest pain -Hold Lasix, apixaban.  Continue bisoprolol, statin    OSA (obstructive sleep apnea) -Continue CPAP  Mild acute on chronic kidney disease stage 3 (HCC) -Hold Lasix until creatinine function improves, patient reports loss of  appetite and not eating much in the last 1 week, appears to be somewhat dry    Atrial fibrillation, persistent (HCC) -Continue amiodarone, bisoprolol -Hold Eliquis for possible OR, awaiting CT surgery recommendations    COPD (chronic obstructive pulmonary disease) (HCC) -Currently stable, no wheezing  Constipation -Per patient did not have BM in the last 2 weeks, will give Fleet enema in ED -Placed on Senokot S and MiraLAX  DVT prophylaxis: Eliquis held for potential OR, placed on SCDs  CODE STATUS: Discussed in detail with the patient opted to be DNR status  Consults called: CT surgery called by EDP  Family Communication: Admission, patients condition and plan of care including tests being ordered have been discussed with the patient who indicates understanding and agree with the plan and Code Status  Admission status: Inpatient telemetry  Disposition plan: Further plan will depend as patient's clinical course evolves and further radiologic and laboratory data become available.    At the time of admission, it appears that the appropriate admission status for this patient is INPATIENT . This is judged to be reasonable and necessary in order to provide the required intensity of service to ensure the patient's safety given the presenting symptoms, large abdominal abscess, draining pus with abdominal pain, physical exam findings, and initial radiographic data with CT abdomen showing large abscess and laboratory data in the context of their chronic comorbidities.  The medical decision making on this patient was of high complexity and the patient is at high risk for clinical deterioration, therefore this is a level 3 visit.   Time Spent on Admission: 70 minutes    Ripudeep Rai M.D. [facility] 12/26/2017, 5:05 PM Pager: [phone]  If 7PM-7AM, please contact night-coverage [email or web address] Password [email or web address]

## 2017-12-26 NOTE — Consult Note (Signed)
[address]       [place], [address]             [phone]        Dmarius C Menter [place] Medical Record #[id number] Date of Birth: 1936-01-12  Referring: No ref. provider found Primary Care: Cyndi Bender, PA-C Primary Cardiologist:Yeiden Frenkel [place], MD  Chief Complaint:    Chief Complaint  Patient presents with  . Abscess    History of Present Illness:    The patient is a 82 year old male known to T CTS having undergone recent procedure:  DATE OF PROCEDURE:  12/11/2017 DATE OF DISCHARGE:                              OPERATIVE REPORT   OPERATIONS: 1. Drainage of left upper abdominal wall subrectus hematoma. 2. Placement of 15-French Jackson-Pratt drain.  SURGEON:  Ivin Poot, MD.  ANESTHESIA:  General with LMA and airway.  PREOPERATIVE DIAGNOSIS:  Trauma to the abdomen with collection of a painful hematoma because of the patient's long-term anticoagulation with Eliquis.  POSTOPERATIVE DIAGNOSIS:  Trauma to the abdomen with collection of a painful hematoma because of the patient's long-term anticoagulation with Eliquis.  He presented to the emergency department today for further evaluation.  2 days ago the patient noticed increasing redness as well as purulent drainage and pain and was seen at his primary care physician's office at which time antibiotics were prescribed and the wound was cleaned.  On today's date he noted increasing redness and drainage in the wound area and came to the emergency department for further evaluation and treatment.  He does note a low-grade temperature of 99 degrees and some mild chills.  Otherwise he denies nausea, vomiting or diarrhea.  He does say he has not had a bowel movement in several weeks.  He denies abdominal pain with the exception of the area of the cellulitis which is tender to palpation superficially.  He will be admitted by the medical service and we will follow in consultation.  It does not  appear that the patient will require surgical intervention at this time but will require close observation.    Current Activity/ Functional Status: Patient is independent with mobility/ambulation, transfers, ADL's, IADL's.   Zubrod Score: At the time of surgery this patient's most appropriate activity status/level should be described as: []     0    Normal activity, no symptoms [x]     1    Restricted in physical strenuous activity but ambulatory, able to do out light work []     2    Ambulatory and capable of self care, unable to do work activities, up and about                 more than 50%  Of the time                            []     3    Only limited self care, in bed greater than 50% of waking hours []     4    Completely disabled, no self care, confined to bed or chair []     5    Moribund  Past Medical History:  Diagnosis Date  . Anemia   . Arthritis   . Atrial fibrillation, persistent ([place]) 12/15/2017  . Cardiomyopathy, ischemic    a.  s/p ICD placement  . Chronic combined systolic and diastolic CHF (congestive heart failure) ([place])   . CKD (chronic kidney disease), stage III ([place])   . COPD (chronic obstructive pulmonary disease) ([place])   . Coronary artery disease    a. s/p CABG 1990. b. multiple PCIs since that time including CTO angioplasty (without stenting) PCI of RCA 09/2014, with last cath 02/2015 done for worsening CHF with unsuccessful attempt at PCI of the RCA due to inability to cross occlusion - medical therapy advised at that time.  . Debilitated 12/15/2017  . GERD (gastroesophageal reflux disease)   . HLD (hyperlipidemia)   . Hypertension   . Occlusion and stenosis of carotid artery without mention of cerebral infarction   . On home oxygen therapy    "2L; 24/7" (11/08/2017)  . Orthostatic hypotension   . Paroxysmal ventricular tachycardia ([place])   . Peripheral vascular disease ([place])   . Raynaud's syndrome   . Stroke [facility])    a. CT head 06/2015: Small old lacunar  infarct LEFT basal ganglia.  . Thrombocytopenia ([place])   . TIA (transient ischemic attack)    a. s/p LHC on 10/01/14   . Type II diabetes mellitus ([place])     Past Surgical History:  Procedure Laterality Date  . ANAL FISTULECTOMY  2000's  . CARDIAC CATHETERIZATION  10/01/2014   PTVA to CTO with restoration of TIMI 3  . CATARACT EXTRACTION, BILATERAL Bilateral ~ 2009  . CHEST EXPLORATION N/A 12/11/2017   Procedure: IRRIGATION AND DEBRIDEMENT OF CHEST HEMATOMA;  Surgeon: Ivin Poot, MD;  Location: [facility];  Service: Thoracic;  Laterality: N/A;  . CORONARY ARTERY BYPASS GRAFT  01/1989   "CABG X3"   . FINGER FRACTURE SURGERY Left ~ 2000   "pointer"  . IMPLANTABLE CARDIOVERTER DEFIBRILLATOR (ICD) GENERATOR CHANGE Left 10/05/2011   Procedure: ICD GENERATOR CHANGE;  Surgeon: Evans Lance, MD;  [facility] C[facility] LAB;  Service: Cardiovascular;  Laterality: Left;  . LEFT AND RIGHT HEART CATHETERIZATION WITH CORONARY/GRAFT ANGIOGRAM N/A 04/26/2013   Procedure: LEFT AND RIGHT HEART CATHETERIZATION WITH Beatrix Fetters;  Surgeon: Sherren Mocha, MD;  Location[facility] C[facility] LAB;  Service: Cardiovascular;  Laterality: N/A;  . LEFT HEART CATHETERIZATION WITH CORONARY ANGIOGRAM N/A 08/01/2014   Procedure: LEFT HEART CATHETERIZATION WITH CORONARY ANGIOGRAM;  Surgeon: Sinclair Grooms, MD;  Location: [facility] CATH LAB;  Service: Cardiovascular;  Laterality: N/A;  . LEFT HEART CATHETERIZATION WITH CORONARY ANGIOGRAM N/A 03/20/2015   Procedure: LEFT HEART CATHETERIZATION WITH CORONARY ANGIOGRAM;  Surgeon: Omie Ferger M [place], MD;  Location: [facility] CATH LAB;  Service: Cardiovascular;  Laterality: N/A;  . PERCUTANEOUS STENT INTERVENTION  04/26/2013   Procedure: PERCUTANEOUS STENT INTERVENTION;  Surgeon: Sherren Mocha, MD;  Location: [facility] CATH LAB;  Service: Cardiovascular;;  . RIGHT HEART CATHETERIZATION  10/01/2014   Procedure: RIGHT HEART CATH;  Surgeon: Truth Barot M [place], MD;  Location: [facility] CATH LAB;  Service:  Cardiovascular;;    Social History   Tobacco Use  Smoking Status Former Smoker  . Years: 3.00  . Types: Pipe  Smokeless Tobacco Never Used  Tobacco Comment   "quit smoking pipe in early 1970's"   Social History   Substance and Sexual Activity  Alcohol Use Yes   Comment: 11/08/2017 "wine w/dinner maybe once/yr; or less"    Social History   Socioeconomic History  . Marital status: Widowed    Spouse name: Not on file  . Number of children: Not on file  . Years of education: Not  on file  . Highest education level: Not on file  Social Needs  . Financial resource strain: Not on file  . Food insecurity - worry: Not on file  . Food insecurity - inability: Not on file  . Transportation needs - medical: Not on file  . Transportation needs - non-medical: Not on file  Occupational History  . Not on file  Tobacco Use  . Smoking status: Former Smoker    Years: 3.00    Types: Pipe  . Smokeless tobacco: Never Used  . Tobacco comment: "quit smoking pipe in early 1970's"  Substance and Sexual Activity  . Alcohol use: Yes    Comment: 11/08/2017 "wine w/dinner maybe once/yr; or less"  . Drug use: No  . Sexual activity: No  Other Topics Concern  . 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 Has 3 daughters. 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     Allergies  Allergen Reactions  . Coreg [Carvedilol] Other (See Comments)    Shortness of breath ,fatigue ,dizzyness   . Lovastatin Other (See Comments)    CK elevation, Myalgias  . Ativan [Lorazepam] Other (See Comments)    Pt. had side effects    Current Facility-Administered Medications  Medication Dose Route Frequency Provider Last Rate Last Dose  . piperacillin-tazobactam (ZOSYN) IVPB 3.375 g  3.375 g Intravenous Q8H Patterson Hammersmith, RPH      . sodium phosphate (FLEET) 7-19 GM/118ML enema 1 enema  1 enema Rectal Once Rai,  Ripudeep K, MD      . vancomycin (VANCOCIN) 1,500 mg in sodium chloride 0.9 % 500 mL IVPB  1,500 mg Intravenous Once Patterson Hammersmith, RPH 250 mL/hr at 12/26/17 1606 1,500 mg at 12/26/17 1606  . [START ON 12/27/2017] vancomycin (VANCOCIN) IVPB 750 mg/150 ml premix  750 mg Intravenous Q24H Patterson Hammersmith, [facility]       Current Outpatient Medications  Medication Sig Dispense Refill  . acetaminophen (TYLENOL) 325 MG tablet Take 2 tablets (650 mg total) by mouth every 4 (four) hours as needed for headache or mild pain.    Marland Kitchen amiodarone (PACERONE) 200 MG tablet Take 1 tablet (200 mg total) by mouth daily. 30 tablet 6  . apixaban (ELIQUIS) 5 MG TABS tablet Take 1 tablet (5 mg total) by mouth 2 (two) times daily. 60 tablet 6  . bisoprolol (ZEBETA) 5 MG tablet Take 0.5 tablets (2.5 mg total) by mouth daily. 90 tablet 3  . CALCIUM PO Take 1 tablet by mouth at bedtime.    . Cyanocobalamin (VITAMIN B-12 PO) Take 1 tablet by mouth daily.     Marland Kitchen docusate sodium (COLACE) 100 MG capsule Take 1 capsule (100 mg total) by mouth at bedtime. 10 capsule 0  . DULoxetine (CYMBALTA) 30 MG capsule Take 30 mg by mouth daily.   3  . furosemide (LASIX) 40 MG tablet Take 1 tablet (40 mg total) by mouth 2 (two) times daily. 60 tablet 6  . insulin detemir (LEVEMIR) 100 UNIT/ML injection Inject 10 Units into the skin See admin instructions. 10 units in the morning. May increase as needed to keep fasting BS < 130.    . lidocaine (LIDODERM) 5 % Place 2 patches onto the skin daily. Remove & Discard patch within 12 hours or as directed by MD 30 patch 0  . linagliptin (TRADJENTA) 5 MG TABS tablet Take 1  tablet (5 mg total) by mouth daily. 30 tablet 1  . methocarbamol (ROBAXIN) 750 MG tablet Take 1 tablet (750 mg total) by mouth every 8 (eight) hours as needed for muscle spasms. 30 tablet 1  . nitroGLYCERIN (NITROSTAT) 0.4 MG SL tablet Place 0.4 mg under the tongue every 5 (five) minutes as needed for chest pain.    Marland Kitchen omeprazole (PRILOSEC)  20 MG capsule Take 20 mg by mouth every morning.  7  . pravastatin (PRAVACHOL) 40 MG tablet Take 1 tablet (40 mg total) by mouth daily at 6 PM. 30 tablet 0  . traMADol (ULTRAM) 50 MG tablet Take 50 mg by mouth 3 (three) times daily as needed for pain.  2  . insulin glargine (LANTUS) 100 UNIT/ML injection Inject 0.1 mLs (10 Units total) into the skin daily. (Patient not taking: Reported on 12/26/2017) 10 mL 11     (Not in a hospital admission)  Family History  Problem Relation Age of Onset  . Heart failure Mother 57  . Heart attack Father   . Leukemia Sister   . Stomach cancer Maternal Grandfather      Review of Systems:   Review of Systems  Constitutional: Positive for chills and fever.  HENT:       Nonproductive cough  Eyes: Negative.   Respiratory: Positive for cough.   Cardiovascular: Negative.   Gastrointestinal: Negative.   Genitourinary: Positive for flank pain.  Skin:       Increasing erythema of the left lower abdominal region  Neurological: Negative for dizziness, tingling, tremors, sensory change, speech change, focal weakness and headaches.  Psychiatric/Behavioral: Negative.      Physical Exam: BP (!) 126/42   Pulse 73   Resp 13   Ht 5\' 7"  (1.702 m)   Wt 151 lb (68.5 kg)   SpO2 100%   BMI 23.65 kg/m    General appearance: alert, cooperative, appears stated age and no distress Head: Normocephalic, without obvious abnormality, atraumatic Neck: no adenopathy, no carotid bruit, no JVD, supple, symmetrical, trachea midline and thyroid not enlarged, symmetric, no tenderness/mass/nodules Lymph nodes: Cervical, supraclavicular, and axillary nodes normal. Resp: Diminished in lower field Back: negative Cardio: regular rate and rhythm GI: soft, non-tender; bowel sounds normal; no masses,  no organomegaly Extremities: edema Ankle Neurologic: Grossly normal Skin: area od induration and cellulitie LLQ in location od precious surgical incision extending  laterally Diagnostic Studies & Laboratory data:     Recent Radiology Findings:   Ct Abdomen Pelvis W Contrast  Result Date: 12/26/2017 CLINICAL DATA:  Abdominal pain, fever. EXAM: CT ABDOMEN AND PELVIS WITH CONTRAST TECHNIQUE: Multidetector CT imaging of the abdomen and pelvis was performed using the standard protocol following bolus administration of intravenous contrast. CONTRAST:  71mL ISOVUE-300 IOPAMIDOL (ISOVUE-300) INJECTION 61% COMPARISON:  CT scan of January 12, 2011. Ultrasound of December 05, 2017. FINDINGS: Lower chest: No acute abnormality. Hepatobiliary: Cholelithiasis is noted without inflammation. Liver is unremarkable. Pancreas: Unremarkable. No pancreatic ductal dilatation or surrounding inflammatory changes. Spleen: Normal in size without focal abnormality. Adrenals/Urinary Tract: Adrenal glands appear normal. Small right renal cyst is noted. No hydronephrosis or renal obstruction is noted. No renal or ureteral calculi are noted. Urinary bladder is unremarkable. Stomach/Bowel: Stomach is within normal limits. Appendix appears normal. No evidence of bowel wall thickening, distention, or inflammatory changes. Vascular/Lymphatic: Aortic atherosclerosis. No enlarged abdominal or pelvic lymph nodes. Reproductive: Prostate is unremarkable. Other: There is continued presence of fluid collection seen around epicardial leads in the left lower  quadrant of the abdomen this fluid collection currently measures 8.8 x 3.0 cm. Inflammatory changes are now noted around it and this is concerning for abscess. Please refer to laboratory data from ultrasound-guided aspiration performed on January 15. Musculoskeletal: No acute or significant osseous findings. IMPRESSION: 8.8 x 3.0 cm fluid collection is seen in the subcutaneous tissues of the left lower quadrant the abdomen surrounding epicardial leads. Inflammatory changes noted around this concerning for abscess. This fluid collection was recently aspirated, and  correlation with laboratory data is recommended. Cholelithiasis is noted without inflammation. Aortic atherosclerosis. Electronically Signed   By: Marijo Conception, M.D.   On: 12/26/2017 15:04     I have independently reviewed the above radiologic studies.  Recent Lab Findings: Lab Results  Component Value Date   WBC 10.8 (H) 12/26/2017   HGB 9.0 (L) 12/26/2017   HCT 29.6 (L) 12/26/2017   PLT 181 12/26/2017   GLUCOSE 234 (H) 12/26/2017   CHOL 127 06/29/2015   TRIG 111 06/29/2015   HDL 36 (L) 06/29/2015   LDLCALC 69 06/29/2015   ALT 14 (L) 12/26/2017   AST 22 12/26/2017   NA 137 12/26/2017   K 3.6 12/26/2017   CL 88 (L) 12/26/2017   CREATININE 1.76 (H) 12/26/2017   BUN 25 (H) 12/26/2017   CO2 34 (H) 12/26/2017   TSH 0.411 12/07/2017   INR 1.26 12/06/2017   HGBA1C 8.8 (H) 12/08/2017      Assessment / Plan: Cellulitis of the left lower abdominal region after recent procedure described above.  He is being admitted to the medical service for intravenous antibiotics and close observation as he could potentially require further surgical debridement if findings worsen.        I  spent 25 minutes counseling the patient face to face.   [name], PA-C 12/26/2017 5:25 PM Agree with above assessment and documentation. Patient examined, left upper quadrant wound and in AICD pocket examined. The patient has a large indurated area involving the old AICD pocket. This developed after a large hematoma was opened and drained last month to provide comfort for this gentleman for a very painful rectus hematoma This needs to be opened under anesthesia drained and irrigated and packed. This will be performed in the operating room on February 8.

## 2017-12-26 NOTE — ED Provider Notes (Signed)
Emergency Department Provider Note   I have reviewed the triage vital signs and the nursing notes.   HISTORY  Chief Complaint Abscess   HPI Victor [name] is a 82 y.o. male with PMH of A-fib, CKD, COPD, CAD s/p CABG, HLD, and prior CVA presents to the emergency department for evaluation of left lower abdominal incision which has become warm, red, and began draining.  The patient has a recent history of seromas near epicardial wires explored by Cardiothoracic Surgery on last admission. Abx held due to not thinking this fluid collection was infectious.  The patient returned home after a course where he continued to improve.  2 days ago he developed redness, pain, warmth over the incision in his left lower abdomen.  He saw his primary care physician who started him on antibiotics yesterday.  He has had subjective fevers at home with worsening pain and thick discharge from the wound.  Denies any nausea, vomiting, diarrhea.  No issue with the chest wall incisions. Denies any CP or SOB.    Past Medical History:  Diagnosis Date  . Anemia   . Arthritis   . Atrial fibrillation, persistent ([place]) 12/15/2017  . Cardiomyopathy, ischemic    a. s/p ICD placement  . Chronic combined systolic and diastolic CHF (congestive heart failure) ([place])   . CKD (chronic kidney disease), stage III ([place])   . COPD (chronic obstructive pulmonary disease) ([place])   . Coronary artery disease    a. s/p CABG 1990. b. multiple PCIs since that time including CTO angioplasty (without stenting) PCI of RCA 09/2014, with last cath 02/2015 done for worsening CHF with unsuccessful attempt at PCI of the RCA due to inability to cross occlusion - medical therapy advised at that time.  . Debilitated 12/15/2017  . GERD (gastroesophageal reflux disease)   . HLD (hyperlipidemia)   . Hypertension   . Occlusion and stenosis of carotid artery without mention of cerebral infarction   . On home oxygen therapy    "2L; 24/7" (11/08/2017)    . Orthostatic hypotension   . Paroxysmal ventricular tachycardia ([place])   . Peripheral vascular disease ([place])   . Raynaud's syndrome   . Stroke [facility])    a. CT head 06/2015: Small old lacunar infarct LEFT basal ganglia.  . Thrombocytopenia ([place])   . TIA (transient ischemic attack)    a. s/p LHC on 10/01/14   . Type II diabetes mellitus [facility])     Patient Active Problem List   Diagnosis Date Noted  . Abdominal wall abscess 12/26/2017  . Atrial fibrillation, persistent ([place]) 12/15/2017  . DNR (do not resuscitate) 12/15/2017  . Debilitated 12/15/2017  . COPD (chronic obstructive pulmonary disease) ([place]) 12/15/2017  . Leukocytosis 12/06/2017  . Abdominal wall fluid collections 12/04/2017  . NSTEMI (non-ST elevated myocardial infarction) ([place]) 11/08/2017  . Elevated troponin 11/08/2017  . Demand ischemia ([place])   . Acute on chronic combined systolic and diastolic CHF (congestive heart failure) ([place])   . Chest pain   . AKI (acute kidney injury) ([place]) 07/16/2017  . Hyponatremia 07/16/2017  . Stage 3 chronic kidney disease ([place]) 10/27/2016  . Orthostatic hypotension 09/16/2016  . Emphysema of lung ([place]) 12/23/2015  . OSA (obstructive sleep apnea)   . Near syncope 06/28/2015  . GERD (gastroesophageal reflux disease)   . Hx of CABG 1990   . Essential hypertension 10/02/2014  . TIA (transient ischemic attack) 10/02/2014  . Microcytic anemia 04/25/2013  . Acute on chronic systolic CHF (congestive heart  failure) ([place]) 04/24/2013  . Insulin dependent type 2 diabetes mellitus, controlled ([place]) 07/25/2009  . Cardiomyopathy, ischemic 07/25/2009  . VENTRICULAR TACHYCARDIA 07/25/2009  . Peripheral vascular disease ([place]) 07/25/2009  . Osteoarthritis 07/25/2009  . Arthropathy 07/25/2009  . BACK PAIN 07/25/2009  . Automatic implantable cardioverter-defibrillator in situ 07/25/2009    Past Surgical History:  Procedure Laterality Date  . ANAL FISTULECTOMY  2000's  . CARDIAC CATHETERIZATION   10/01/2014   PTVA to CTO with restoration of TIMI 3  . CATARACT EXTRACTION, BILATERAL Bilateral ~ 2009  . CHEST EXPLORATION N/A 12/11/2017   Procedure: IRRIGATION AND DEBRIDEMENT OF CHEST HEMATOMA;  Surgeon: Ivin Poot, MD;  Location: [facility];  Service: Thoracic;  Laterality: N/A;  . CORONARY ARTERY BYPASS GRAFT  01/1989   "CABG X3"   . FINGER FRACTURE SURGERY Left ~ 2000   "pointer"  . IMPLANTABLE CARDIOVERTER DEFIBRILLATOR (ICD) GENERATOR CHANGE Left 10/05/2011   Procedure: ICD GENERATOR CHANGE;  Surgeon: Evans Lance, MD;  Location: [facility] CATH LAB;  Service: Cardiovascular;  Laterality: Left;  . LEFT AND RIGHT HEART CATHETERIZATION WITH CORONARY/GRAFT ANGIOGRAM N/A 04/26/2013   Procedure: LEFT AND RIGHT HEART CATHETERIZATION WITH Beatrix Fetters;  Surgeon: Sherren Mocha, MD;  Location: [facility] CATH LAB;  Service: Cardiovascular;  Laterality: N/A;  . LEFT HEART CATHETERIZATION WITH CORONARY ANGIOGRAM N/A 08/01/2014   Procedure: LEFT HEART CATHETERIZATION WITH CORONARY ANGIOGRAM;  Surgeon: Sinclair Grooms, MD;  Location: [facility] CATH LAB;  Service: Cardiovascular;  Laterality: N/A;  . LEFT HEART CATHETERIZATION WITH CORONARY ANGIOGRAM N/A 03/20/2015   Procedure: LEFT HEART CATHETERIZATION WITH CORONARY ANGIOGRAM;  Surgeon: Peter M Martinique, MD;  Location: [facility] CATH LAB;  Service: Cardiovascular;  Laterality: N/A;  . PERCUTANEOUS STENT INTERVENTION  04/26/2013   Procedure: PERCUTANEOUS STENT INTERVENTION;  Surgeon: Sherren Mocha, MD;  Location: [facility] CATH LAB;  Service: Cardiovascular;;  . RIGHT HEART CATHETERIZATION  10/01/2014   Procedure: RIGHT HEART CATH;  Surgeon: Peter M Martinique, MD;  Location: [facility] CATH LAB;  Service: Cardiovascular;;    Current Outpatient Rx  . Order #: [id number] Class: No Print  . Order #: [id number] Class: Normal  . Order #: [id number] Class: Normal  . Order #: [id number] Class: Normal  . Order #: [id number] Class: Historical Med  . Order #: [id number] Class: Historical Med  . Order  #: [id number] Class: Print  . Order #: [id number] Class: Historical Med  . Order #: [id number] Class: Print  . Order #: [id number] Class: Historical Med  . Order #: [id number] Class: Normal  . Order #: [id number] Class: Normal  . Order #: [id number] Class: Normal  . Order #: [id number] Class: Historical Med  . Order #: [id number] Class: Historical Med  . Order #: [id number] Class: Print  . Order #: [id number] Class: Historical Med  . Order #: [id number] Class: Normal    Allergies Coreg [carvedilol]; Lovastatin; and Ativan [lorazepam]  Family History  Problem Relation Age of Onset  . Heart failure Mother 27  . Heart attack Father   . Leukemia Sister   . Stomach cancer Maternal Grandfather     Social History Social History   Tobacco Use  . Smoking status: Former Smoker    Years: 3.00    Types: Pipe  . Smokeless tobacco: Never Used  . Tobacco comment: "quit smoking pipe in early 1970's"  Substance Use Topics  . Alcohol use: Yes    Comment: 11/08/2017 "wine w/dinner maybe once/yr; or less"  . Drug use: No    Review of Systems  Constitutional: Positive fever/chills Eyes: No visual changes. ENT: No sore throat. Cardiovascular: Denies  chest pain. Respiratory: Denies shortness of breath. Gastrointestinal: Positive abdominal pain.  No nausea, no vomiting.  No diarrhea.  No constipation. Genitourinary: Negative for dysuria. Musculoskeletal: Negative for back pain. Skin: Positive rash and drainage from abdominal incision.  Neurological: Negative for headaches, focal weakness or numbness.  10-point ROS otherwise negative.  ____________________________________________   PHYSICAL EXAM:  VITAL SIGNS: ED Triage Vitals  Enc Vitals Group     BP 12/26/17 1156 125/72     Pulse Rate 12/26/17 1156 78     Resp 12/26/17 1156 19     Temp --      Temp src --      SpO2 12/26/17 1156 98 %     Weight 12/26/17 1206 151 lb (68.5 kg)     Height 12/26/17 1206 5\' 7"  (1.702 m)     Pain Score 12/26/17  1206 8   Constitutional: Alert and oriented. Well appearing and in no acute distress. Eyes: Conjunctivae are normal.  Head: Atraumatic. Nose: No congestion/rhinnorhea. Mouth/Throat: Mucous membranes are moist.   Neck: No stridor.  Cardiovascular: Normal rate, regular rhythm. Good peripheral circulation. Grossly normal heart sounds.   Respiratory: Normal respiratory effort.  No retractions. Lungs CTAB. Gastrointestinal: Soft with left mid-abdominal incisions with surrounding induration and cellulitis. Purulent drainage coming from the abdomen. No distention.  Musculoskeletal: No lower extremity tenderness nor edema. No gross deformities of extremities. Neurologic:  Normal speech and language. No gross focal neurologic deficits are appreciated.  Skin:  Skin is warm, dry and intact. Cellulitis and discharge described above.    ____________________________________________   LABS (all labs ordered are listed, but only abnormal results are displayed)  Labs Reviewed  COMPREHENSIVE METABOLIC PANEL - Abnormal; Notable for the following components:      Result Value   Chloride 88 (*)    CO2 34 (*)    Glucose, Bld 234 (*)    BUN 25 (*)    Creatinine, Ser 1.76 (*)    Calcium 8.5 (*)    Total Protein 6.1 (*)    Albumin 2.4 (*)    ALT 14 (*)    GFR calc non Af Amer 35 (*)    GFR calc Af Amer 41 (*)    All other components within normal limits  CBC WITH DIFFERENTIAL/PLATELET - Abnormal; Notable for the following components:   WBC 10.8 (*)    RBC 3.57 (*)    Hemoglobin 9.0 (*)    HCT 29.6 (*)    MCH 25.2 (*)    Neutro Abs 8.9 (*)    All other components within normal limits  CULTURE, BLOOD (ROUTINE X 2)  CULTURE, BLOOD (ROUTINE X 2)  BASIC METABOLIC PANEL  CBC  HEMOGLOBIN A1C  HEMOGLOBIN A1C  I-STAT CG4 LACTIC ACID, ED  I-STAT CG4 LACTIC ACID, ED   ____________________________________________  EKG   EKG Interpretation  Date/Time:  Tuesday December 26 2017 12:35:20  EST Ventricular Rate:  75 PR Interval:    QRS Duration: 116 QT Interval:  452 QTC Calculation: 505 R Axis:   -14 Text Interpretation:  Sinus rhythm Multiple ventricular premature complexes Prolonged PR interval Probable left atrial enlargement Nonspecific intraventricular conduction delay Abnormal T, consider ischemia, diffuse leads Baseline wander in lead(s) V4 No STEMI  Confirmed by Nanda Quinton [phone]) on 12/26/2017 12:40:23 PM       ____________________________________________  RADIOLOGY  Ct Abdomen Pelvis W Contrast  Result Date: 12/26/2017 CLINICAL DATA:  Abdominal pain, fever. EXAM: CT ABDOMEN AND PELVIS WITH CONTRAST TECHNIQUE:  Multidetector CT imaging of the abdomen and pelvis was performed using the standard protocol following bolus administration of intravenous contrast. CONTRAST:  89mL ISOVUE-300 IOPAMIDOL (ISOVUE-300) INJECTION 61% COMPARISON:  CT scan of January 12, 2011. Ultrasound of December 05, 2017. FINDINGS: Lower chest: No acute abnormality. Hepatobiliary: Cholelithiasis is noted without inflammation. Liver is unremarkable. Pancreas: Unremarkable. No pancreatic ductal dilatation or surrounding inflammatory changes. Spleen: Normal in size without focal abnormality. Adrenals/Urinary Tract: Adrenal glands appear normal. Small right renal cyst is noted. No hydronephrosis or renal obstruction is noted. No renal or ureteral calculi are noted. Urinary bladder is unremarkable. Stomach/Bowel: Stomach is within normal limits. Appendix appears normal. No evidence of bowel wall thickening, distention, or inflammatory changes. Vascular/Lymphatic: Aortic atherosclerosis. No enlarged abdominal or pelvic lymph nodes. Reproductive: Prostate is unremarkable. Other: There is continued presence of fluid collection seen around epicardial leads in the left lower quadrant of the abdomen this fluid collection currently measures 8.8 x 3.0 cm. Inflammatory changes are now noted around it and this is  concerning for abscess. Please refer to laboratory data from ultrasound-guided aspiration performed on January 15. Musculoskeletal: No acute or significant osseous findings. IMPRESSION: 8.8 x 3.0 cm fluid collection is seen in the subcutaneous tissues of the left lower quadrant the abdomen surrounding epicardial leads. Inflammatory changes noted around this concerning for abscess. This fluid collection was recently aspirated, and correlation with laboratory data is recommended. Cholelithiasis is noted without inflammation. Aortic atherosclerosis. Electronically Signed   By: Marijo Conception, M.D.   On: 12/26/2017 15:04    ____________________________________________   PROCEDURES  Procedure(s) performed:   Procedures  None ____________________________________________   INITIAL IMPRESSION / ASSESSMENT AND PLAN / ED COURSE  Pertinent labs & imaging results that were available during my care of the patient were reviewed by me and considered in my medical decision making (see chart for details).  Patient presents to the ED with abdominal pain, fever at home, and purulent drainage from the abdominal wound. There is surrounding cellulitis and induration. Reviewed prior admit notes including op notes. Plan for labs, abx, CT, and discussion with cardiothoracic surgery team.   03:15 PM  Reviewed CT imaging and labs. Spoke with cardiothoracic surgery PA.  They will consult on the patient and leave a note.  Agree with starting antibiotics for now and they will advise further on repeat surgical procedures or drain placement.  Requesting hospitalist admission.  Will call to discuss the case with admission team.   Discussed patient's case with Hospitalist to request admission. Patient and family (if present) updated with plan. Care transferred to Hospitalist service.  I reviewed all nursing notes, vitals, pertinent old records, EKGs, labs, imaging (as  available).    ____________________________________________  FINAL CLINICAL IMPRESSION(S) / ED DIAGNOSES  Final diagnoses:  Abdominal wall abscess     MEDICATIONS GIVEN DURING THIS VISIT:  Medications  vancomycin (VANCOCIN) IVPB 750 mg/150 ml premix (not administered)  piperacillin-tazobactam (ZOSYN) IVPB 3.375 g (not administered)  sodium phosphate (FLEET) 7-19 GM/118ML enema 1 enema (not administered)  acetaminophen (TYLENOL) tablet 650 mg (not administered)    Or  acetaminophen (TYLENOL) suppository 650 mg (not administered)  HYDROcodone-acetaminophen (NORCO/VICODIN) 5-325 MG per tablet 1-2 tablet (not administered)  ondansetron (ZOFRAN) tablet 4 mg (not administered)    Or  ondansetron (ZOFRAN) injection 4 mg (not administered)  amiodarone (PACERONE) tablet 200 mg (not administered)  bisoprolol (ZEBETA) tablet 2.5 mg (not administered)  DULoxetine (CYMBALTA) DR capsule 30 mg (not administered)  methocarbamol (ROBAXIN) tablet  750 mg (not administered)  nitroGLYCERIN (NITROSTAT) SL tablet 0.4 mg (not administered)  pantoprazole (PROTONIX) EC tablet 40 mg (not administered)  pravastatin (PRAVACHOL) tablet 40 mg (not administered)  senna-docusate (Senokot-S) tablet 1 tablet (not administered)  polyethylene glycol (MIRALAX / GLYCOLAX) packet 17 g (not administered)  insulin aspart (novoLOG) injection 0-9 Units (not administered)  insulin glargine (LANTUS) injection 5 Units (not administered)  sodium chloride 0.9 % bolus 500 mL (0 mLs Intravenous Stopped 12/26/17 1604)  iopamidol (ISOVUE-300) 61 % injection (75 mLs  Contrast Given 12/26/17 1428)  vancomycin (VANCOCIN) 1,500 mg in sodium chloride 0.9 % 500 mL IVPB (0 mg Intravenous Stopped 12/26/17 1806)     Note:  This document was prepared using Dragon voice recognition software and may include unintentional dictation errors.  Nanda Quinton, MD Emergency Medicine    Long, [facility], MD 12/26/17 [place]

## 2017-12-26 NOTE — ED Notes (Signed)
Patient transported to CT 

## 2017-12-26 NOTE — Progress Notes (Signed)
Pharmacy Antibiotic Note  Victor [name] is a 82 y.o. male admitted on 12/26/2017 with cellulitis.  Pharmacy has been consulted for vancomycin and zosyn dosing. Recent admission in January where there was confirmed fluid collection around epicardial pacing wires, but ABX were not started as it was determined non-infectious.   He presents now however with redness, pain, and drainage around the incision. WBCs elevated at 10.8, LA 1.3, subjective fevers PTA, VSS, and Scr above baseline at 1.7.  Plan: Zosyn 3.375g Q8hrs Vancomycin 1500mg  x1 then 750mg  Q24hrs Vancomycin trough as clinically indicated Monitor renal function, cultures, LOT  Height: 5\' 7"  (170.2 cm) Weight: 151 lb (68.5 kg) IBW/kg (Calculated) : 66.1  No data recorded.  Recent Labs  Lab 12/26/17 1218 12/26/17 1232  WBC 10.8*  --   CREATININE 1.76*  --   LATICACIDVEN  --  1.31    Estimated Creatinine Clearance: 31.8 mL/min (A) (by C-G formula based on SCr of 1.76 mg/dL (H)).     Antimicrobials this admission: Zosyn 2/5>> Vanc 2/5>>  Dose adjustments this admission: n/a  Microbiology results: Blood 2/5>>  Thank you for allowing pharmacy to be a part of this patient's care.   Patterson Hammersmith PharmD PGY1 Pharmacy Practice Resident 12/26/2017 3:53 PM Pager: [phone]

## 2017-12-27 ENCOUNTER — Encounter (HOSPITAL_COMMUNITY): Payer: Self-pay

## 2017-12-27 LAB — CBC
HCT: 26.2 % — ABNORMAL LOW (ref 39.0–52.0)
Hemoglobin: 8.2 g/dL — ABNORMAL LOW (ref 13.0–17.0)
MCH: 26.1 pg (ref 26.0–34.0)
MCHC: 31.3 g/dL (ref 30.0–36.0)
MCV: 83.4 fL (ref 78.0–100.0)
Platelets: 155 10*3/uL (ref 150–400)
RBC: 3.14 MIL/uL — ABNORMAL LOW (ref 4.22–5.81)
RDW: 15.1 % (ref 11.5–15.5)
WBC: 8 10*3/uL (ref 4.0–10.5)

## 2017-12-27 LAB — BASIC METABOLIC PANEL
Anion gap: 12 (ref 5–15)
BUN: 20 mg/dL (ref 6–20)
CO2: 34 mmol/L — ABNORMAL HIGH (ref 22–32)
Calcium: 8.1 mg/dL — ABNORMAL LOW (ref 8.9–10.3)
Chloride: 91 mmol/L — ABNORMAL LOW (ref 101–111)
Creatinine, Ser: 1.47 mg/dL — ABNORMAL HIGH (ref 0.61–1.24)
GFR calc Af Amer: 51 mL/min — ABNORMAL LOW (ref >60)
GFR, EST NON AFRICAN AMERICAN: 44 mL/min — AB (ref >60)
GLUCOSE: 126 mg/dL — AB (ref 65–99)
Potassium: 3 mmol/L — ABNORMAL LOW (ref 3.5–5.1)
Sodium: 137 mmol/L (ref 135–145)

## 2017-12-27 LAB — GLUCOSE, CAPILLARY
GLUCOSE-CAPILLARY: 98 mg/dL (ref 65–99)
GLUCOSE-CAPILLARY: 127 mg/dL — AB (ref 65–99)
GLUCOSE-CAPILLARY: 156 mg/dL — AB (ref 65–99)
Glucose-Capillary: 151 mg/dL — ABNORMAL HIGH (ref 65–99)
Glucose-Capillary: 122 mg/dL — ABNORMAL HIGH (ref 65–99)
Glucose-Capillary: 151 mg/dL — ABNORMAL HIGH (ref 65–99)

## 2017-12-27 LAB — APTT: APTT: 99 s — AB (ref 24–36)

## 2017-12-27 LAB — HEPARIN LEVEL (UNFRACTIONATED)

## 2017-12-27 MED ORDER — ORAL CARE MOUTH RINSE
15.0000 mL | Freq: Two times a day (BID) | OROMUCOSAL | Status: DC
  Administered 2017-12-27 – 2018-01-10 (×20): 15 mL via OROMUCOSAL

## 2017-12-27 MED ORDER — HEPARIN (PORCINE) IN NACL 100-0.45 UNIT/ML-% IJ SOLN
800.0000 [IU]/h | INTRAMUSCULAR | Status: AC
  Administered 2017-12-27: 900 [IU]/h via INTRAVENOUS
  Administered 2017-12-28: 800 [IU]/h via INTRAVENOUS
  Filled 2017-12-27 (×2): qty 250

## 2017-12-27 NOTE — Progress Notes (Signed)
ANTICOAGULATION CONSULT NOTE - Initial Consult  Pharmacy Consult for heparin Indication: atrial fibrillation  Allergies  Allergen Reactions  . Coreg [Carvedilol] Other (See Comments)    Shortness of breath ,fatigue ,dizzyness   . Lovastatin Other (See Comments)    CK elevation, Myalgias  . Ativan [Lorazepam] Other (See Comments)    Pt. had side effects    Patient Measurements: Height: 5\' 7"  (170.2 cm) Weight: 151 lb (68.5 kg) IBW/kg (Calculated) : 66.1 Heparin Dosing Weight:68.5kg  Vital Signs: Temp: 97.6 F (36.4 C) (02/06 1414) BP: 107/90 (02/06 1414) Pulse Rate: 64 (02/06 1414)  Labs: Recent Labs    12/26/17 1218 12/27/17 0521 12/27/17 1940  HGB 9.0* 8.2*  --   HCT 29.6* 26.2*  --   PLT 181 155  --   APTT  --   --  99*  CREATININE 1.76* 1.47*  --     Estimated Creatinine Clearance: 38.1 mL/min (A) (by C-G formula based on SCr of 1.47 mg/dL (H)).   Assessment: 84 YOM with recently diagnoses AFib who was started on Eliquis. Eliquis is now on hold in anticipation of patient possibly needing I&D for abscess in abdominal wall. Last dose of Eliquis 2/4 (patient unsure of time).  Baseline Hgb 8.2, plts 155- no bleeding noted  Initial appt is within goal at 99s, heparin level >2.2 as expected due to recent apixaban. Will monitor using aptt for now.  Goal of Therapy:  Aptt goal 66-102s Heparin level 0.3-0.7 units/ml Monitor platelets by anticoagulation protocol: Yes   Plan:  Continue Heparin infusion at 900 units/hr Confirmatory aPTT with am labs  Daily CBC and watch for s/s bleeding Follow procedural plans and ability to resume PO anticoagulation  Erin Hearing PharmD., BCPS Clinical Pharmacist 12/27/2017 8:53 PM

## 2017-12-27 NOTE — Progress Notes (Addendum)
[facility]                                                                              Patient Demographics  Victor [name], is a 82 y.o. male, DOB - Nov 08, 1936, XLK:[id number]  Admit date - 12/26/2017   Admitting Physician Ripudeep Krystal Eaton, MD  Outpatient Primary MD for the patient is Cyndi Bender, PA-C  Outpatient specialists:   LOS - 1  days   Medical records reviewed and are as summarized below:    Chief Complaint  Patient presents with  . Abscess       Brief summary   Patient is a 81 year old male with history of CAD status post CABG in 1990, chronic combined systolic and diastolic CHF, ischemic cardiomyopathy, history of VT in 2012, status post ICD, diabetes, insulin-dependent, CKD hypertension, hyperlipidemia who was recently discharged on 12/16/17 by cardiology service.   Patient was found to have fluid collection surrounding the epicardial wires from an abandoned epicardial system.  On 12/11/17, patient underwent irrigation and debridement of the hematoma, JP drain until 1/25 and subsequently Eliquis was resumed on discharge.  Patient also developed atrial fibrillation during hospitalization, was placed on amiodarone with plan for DCCV in 3-4 weeks.  Patient was diuresed for acute on chronic systolic CHF. Patient was not discharged on antibiotics as it was felt that it was a noninfected hematoma around the epicardial wires from previous PPM and abdominal cavity. Patient reported that he was in his baseline state of health once he got home however a couple of days later he started noticing increased redness and pain in the same left lower quadrant abdominal area which was drained earlier.   Patient went to his PCP, per patient the wound was cleaned and he received "shot of antibiotic".  Patient reports that the wound continued to progressively get larger in size, more tender and red, on the morning of admission, he woke up with pain and pus was draining  out.   Assessment & Plan   Left rectus sheath abdominal wall abscess, left lower quadrant -CT abdomen showed 8.8x 3 cm fluid collection in the subcutaneous tissue of the left lower quadrant in the abdomen surrounding epicardial leads concerning for abscess -Patient had I&D of the presumed hematoma on 12/11/17 in the same area, she had a JP drain until 1/25 - CT surgery consulted, recommended IV antibiotics for now and possibly will need I&D - Continue IV vancomycin and Zosyn, previous wound cultures had shown MSSA - Held Eliquis  Active Problems:   Insulin dependent type 2 diabetes mellitus, controlled ([place])  -Continue Lantus, placed on sliding scale insulin -CABG stable. Hemoglobin A1c 8.8 on 1/18    Essential hypertension -Currently stable we will continue bisoprolol -Holding Lasix due to a TIA    GERD (gastroesophageal reflux disease) -Continue PPI    Hx of CAD status post CABG 1990, chronic combined systolic and diastolic CHF, ischemic cardiomyopathy -2D echo on 1/17 had shown EF of 30-35% with diffuse hypokinesis, moderate to severe LV systolic dysfunction. -Currently no chest pain -Hold Lasix, apixaban.  Continue bisoprolol, statin    OSA (obstructive sleep apnea) -Continue CPAP  Mild acute on chronic kidney disease stage 3 (HCC) -Hold Lasix until creatinine function improves, patient reports loss of appetite and not eating much in the last 1 week -Creatinine improving to 1.4, baseline 1.1-1.2     Atrial fibrillation, persistent (HCC) -Continue amiodarone, bisoprolol -Hold Eliquis for possible OR, placed on IV heparin drip while awaiting decision     COPD (chronic obstructive pulmonary disease) (HCC) -Currently stable, no wheezing  Constipation -Per patient did not have BM in the last 2 weeks, patient received enema  - Constipation resolved, continue scheduled Senokot S and Miralax as needed  Code Status DO NOT RESUSCITATE  DVT Prophylaxis: Heparin drip   Family Communication: Discussed in detail with the patient, all imaging results, lab results explained to the patient    Disposition Plan:   Time Spent in minutes  25 minutes  Procedures:  CT abdomen  Consultants:   CT surgery   Antimicrobials:   IV vancomycin 2/5  IV Zosyn 2/5   Medications  Scheduled Meds: . amiodarone  200 mg Oral Daily  . bisoprolol  2.5 mg Oral Daily  . DULoxetine  30 mg Oral Daily  . insulin aspart  0-9 Units Subcutaneous Q4H  . insulin glargine  5 Units Subcutaneous QHS  . mouth rinse  15 mL Mouth Rinse BID  . pantoprazole  40 mg Oral Daily  . pravastatin  40 mg Oral q1800  . senna-docusate  1 tablet Oral BID   Continuous Infusions: . heparin 900 Units/hr (12/27/17 1059)  . piperacillin-tazobactam (ZOSYN)  IV Stopped (12/27/17 0927)  . vancomycin     PRN Meds:.acetaminophen **OR** acetaminophen, HYDROcodone-acetaminophen, methocarbamol, nitroGLYCERIN, ondansetron **OR** ondansetron (ZOFRAN) IV, polyethylene glycol   Antibiotics   Anti-infectives (From admission, onward)   Start     Dose/Rate Route Frequency Ordered Stop   12/27/17 1600  vancomycin (VANCOCIN) IVPB 750 mg/150 ml premix     750 mg 150 mL/hr over 60 Minutes Intravenous Every 24 hours 12/26/17 1537     12/26/17 2200  piperacillin-tazobactam (ZOSYN) IVPB 3.375 g     3.375 g 12.5 mL/hr over 240 Minutes Intravenous Every 8 hours 12/26/17 1539     12/26/17 1545  vancomycin (VANCOCIN) 1,500 mg in sodium chloride 0.9 % 500 mL IVPB     1,500 mg 250 mL/hr over 120 Minutes Intravenous  Once 12/26/17 1537 12/26/17 1806   12/26/17 1545  piperacillin-tazobactam (ZOSYN) IVPB 3.375 g  Status:  Discontinued     3.375 g 12.5 mL/hr over 240 Minutes Intravenous Every 8 hours 12/26/17 1537 12/26/17 1539   12/26/17 1515  piperacillin-tazobactam (ZOSYN) IVPB 3.375 g  Status:  Discontinued     3.375 g 100 mL/hr over 30 Minutes Intravenous  Once 12/26/17 1512 12/26/17 1604   12/26/17 1515   vancomycin (VANCOCIN) IVPB 1000 mg/200 mL premix  Status:  Discontinued     1,000 mg 200 mL/hr over 60 Minutes Intravenous  Once 12/26/17 1512 12/26/17 1537        Subjective:   Victor [name] was seen and examined today.   no fevers, feels is slightly better this morning. Had a bowel movement earlier this morning. Patient denies dizziness, chest pain, shortness of breath, new weakness, numbess, tingling. No acute events overnight.    Objective:   Vitals:   12/26/17 1900 12/26/17 2000 12/26/17 2109 12/27/17 0544  BP: 131/61 (!) 119/44 (!) 155/58 118/90  Pulse: 75 75 82 63  Resp: 14 10 16 18   Temp:   97.8 F (36.6 C)  97.8 F (36.6 C)  TempSrc:   Oral Oral  SpO2: 99% 96% 100% 99%  Weight:      Height:        Intake/Output Summary (Last 24 hours) at 12/27/2017 1338 Last data filed at 12/27/2017 0304 Gross per 24 hour  Intake 550 ml  Output -  Net 550 ml     Wt Readings from Last 3 Encounters:  12/26/17 68.5 kg (151 lb)  12/16/17 73.2 kg (161 lb 6.4 oz)  11/09/17 73.3 kg (161 lb 9.6 oz)     Exam  General: Alert and oriented x 3, NAD  Eyes:   HEENT:  Atraumatic, normocephalic  Cardiovascular: S1 S2 clear, ireg ireg  Respiratory: Clear to auscultation bilaterally, no wheezing, rales or rhonchi  Gastrointestinal: Soft, nondistended, + bowel sounds, tender indurated area in the left lower quadrant   Ext: no pedal edema bilaterally  Neuro:no new deficits   Musculoskeletal: No digital cyanosis, clubbing  Skin:Cellulitis with erythematous area in the LLQ, indurated, some purulent drainage   Psych: Normal affect and demeanor, alert and oriented x3    Data Reviewed:  I have personally reviewed following labs and imaging studies  Micro Results No results found for this or any previous visit (from the past 240 hour(s)).  Radiology Reports Dg Chest 2 View  Result Date: 12/03/2017 CLINICAL DATA:  Left upper chest pain.  Previous rib fractures EXAM: CHEST  2 VIEW  COMPARISON:  12/02/2017 FINDINGS: CABG. Left AICD remains in place, unchanged. There is cardiomegaly. Low lung volumes. Left base atelectasis. No effusion or pneumothorax. IMPRESSION: Low lung volumes.  Left base atelectasis. Cardiomegaly. Electronically Signed   By: Rolm Baptise M.D.   On: 12/03/2017 07:13   Ct Abdomen Pelvis W Contrast  Result Date: 12/26/2017 CLINICAL DATA:  Abdominal pain, fever. EXAM: CT ABDOMEN AND PELVIS WITH CONTRAST TECHNIQUE: Multidetector CT imaging of the abdomen and pelvis was performed using the standard protocol following bolus administration of intravenous contrast. CONTRAST:  48mL ISOVUE-300 IOPAMIDOL (ISOVUE-300) INJECTION 61% COMPARISON:  CT scan of January 12, 2011. Ultrasound of December 05, 2017. FINDINGS: Lower chest: No acute abnormality. Hepatobiliary: Cholelithiasis is noted without inflammation. Liver is unremarkable. Pancreas: Unremarkable. No pancreatic ductal dilatation or surrounding inflammatory changes. Spleen: Normal in size without focal abnormality. Adrenals/Urinary Tract: Adrenal glands appear normal. Small right renal cyst is noted. No hydronephrosis or renal obstruction is noted. No renal or ureteral calculi are noted. Urinary bladder is unremarkable. Stomach/Bowel: Stomach is within normal limits. Appendix appears normal. No evidence of bowel wall thickening, distention, or inflammatory changes. Vascular/Lymphatic: Aortic atherosclerosis. No enlarged abdominal or pelvic lymph nodes. Reproductive: Prostate is unremarkable. Other: There is continued presence of fluid collection seen around epicardial leads in the left lower quadrant of the abdomen this fluid collection currently measures 8.8 x 3.0 cm. Inflammatory changes are now noted around it and this is concerning for abscess. Please refer to laboratory data from ultrasound-guided aspiration performed on January 15. Musculoskeletal: No acute or significant osseous findings. IMPRESSION: 8.8 x 3.0 cm fluid  collection is seen in the subcutaneous tissues of the left lower quadrant the abdomen surrounding epicardial leads. Inflammatory changes noted around this concerning for abscess. This fluid collection was recently aspirated, and correlation with laboratory data is recommended. Cholelithiasis is noted without inflammation. Aortic atherosclerosis. Electronically Signed   By: Marijo Conception, M.D.   On: 12/26/2017 15:04   US Abdomen Limited  Result Date: 12/03/2017 CLINICAL DATA:  Left anterior abdominal wall fluid  collection seen on CT EXAM: ULTRASOUND ABDOMEN LIMITED COMPARISON:  Chest CT 12/02/2017 FINDINGS: As noted on CT, there is a fluid collection in the anterior abdominal wall surrounding the pacer wires. The fluid collection measures 9.1 x 5.0 x 2.4 cm. Internal echoes noted. IMPRESSION: Complex fluid collection noted around the pacer wires in the anterior abdominal wall measuring up to 9 cm. Electronically Signed   By: Rolm Baptise M.D.   On: 12/03/2017 07:12   US Aspiration  Result Date: 12/05/2017 INDICATION: 82 year old with a fluid collection surrounding epicardial pacer wires in left anterior abdominal wall. EXAM: ULTRASOUND-GUIDED ASPIRATION OF LEFT ABDOMINAL WALL FLUID COLLECTION MEDICATIONS: None ANESTHESIA/SEDATION: None COMPLICATIONS: None immediate. PROCEDURE: Informed written consent was obtained from the patient after a thorough discussion of the procedural risks, benefits and alternatives. All questions were addressed. A timeout was performed prior to the initiation of the procedure. The fluid collection in the left anterior abdominal wall was identified with ultrasound. This area was prepped with chlorhexidine and sterile field was created. Skin was anesthetized with 1% lidocaine. Using ultrasound guidance, an 18 gauge spinal needle was directed into the collection. Very thick brown fluid was aspirated. Very little fluid could be aspirated from this needle. Therefore, a 5 [place] Yueh  catheter was directed into the collection with ultrasound guidance. Again, very thick brown fluid was removed. Approximately 30 mL of fluid was able to be removed. The entire collection could not be decompressed. Yueh catheter was removed. Bandage placed over the puncture site. FINDINGS: Complex fluid collection surrounding epicardial pacer wires in left anterior abdominal wall. Yueh catheter was successfully advanced into the collection and very thick brown fluid was aspirated. 30 mL of fluid was removed. There was still a large amount of fluid remaining in this collection at the end of the procedure. IMPRESSION: Ultrasound-guided aspiration of the complex fluid collection in the left anterior abdominal wall surrounding epicardial pacer wires. Very thick brown fluid was removed from this collection and sent for culture. Based on the fluid consistency, the patient will likely need surgical intervention to remove all the fluid. Electronically Signed   By: Markus Daft M.D.   On: 12/05/2017 20:24   Dg Chest Port 1 View  Result Date: 12/15/2017 CLINICAL DATA:  Shortness of breath.  History of AFib. EXAM: PORTABLE CHEST 1 VIEW COMPARISON:  12/09/2017 FINDINGS: Stable postsurgical changes from CABG. Stable transvenous and direct epicardial pacer leads. Enlarged cardiac silhouette. Mediastinal contours appear intact. Calcific atherosclerotic disease of the aorta. There is no evidence of pneumothorax. No frank pulmonary edema. Low lung volumes with mild prominence of the interstitium. Persistent peribronchial airspace opacities in the left lower lobe. Possible small left pleural effusion. Osseous structures are without acute abnormality. Soft tissues are grossly normal. IMPRESSION: Low lung volumes with mild pulmonary vascular congestion. Enlarged cardiac silhouette. Persistent left lower lobe peribronchial airspace consolidation versus atelectasis. Possible small left pleural effusion. Electronically Signed   By:  Fidela Salisbury M.D.   On: 12/15/2017 12:01   Dg Chest Port 1 View  Result Date: 12/09/2017 CLINICAL DATA:  Shortness of breath EXAM: PORTABLE CHEST 1 VIEW COMPARISON:  12/06/2017 FINDINGS: Chronic cardiomegaly. Status post CABG. There is trans venous and direct epicardial pacer leads in stable position. Low volume chest with interstitial crowding and asymmetric streaky retrocardiac density. There is no edema, air bronchogram, effusion, or pneumothorax. IMPRESSION: Stable low lung volumes with probable retrocardiac atelectasis. Electronically Signed   By: Monte Fantasia M.D.   On: 12/09/2017 19:48  Dg Chest Port 1 View  Result Date: 12/06/2017 CLINICAL DATA:  Shortness of breath EXAM: PORTABLE CHEST 1 VIEW COMPARISON:  12/03/2017 FINDINGS: Bilateral diffuse mild interstitial thickening. No pleural effusion or pneumothorax. No focal consolidation. Stable cardiomegaly. Prior CABG. Single lead cardiac pacemaker. No acute osseous abnormality. IMPRESSION: Cardiomegaly with pulmonary vascular congestion. Electronically Signed   By: Kathreen Devoid   On: 12/06/2017 20:18    Lab Data:  CBC: Recent Labs  Lab 12/26/17 1218 12/27/17 0521  WBC 10.8* 8.0  NEUTROABS 8.9*  --   HGB 9.0* 8.2*  HCT 29.6* 26.2*  MCV 82.9 83.4  PLT 181 191   Basic Metabolic Panel: Recent Labs  Lab 12/26/17 1218 12/27/17 0521  NA 137 137  K 3.6 3.0*  CL 88* 91*  CO2 34* 34*  GLUCOSE 234* 126*  BUN 25* 20  CREATININE 1.76* 1.47*  CALCIUM 8.5* 8.1*   GFR: Estimated Creatinine Clearance: 38.1 mL/min (A) (by C-G formula based on SCr of 1.47 mg/dL (H)). Liver Function Tests: Recent Labs  Lab 12/26/17 1218  AST 22  ALT 14*  ALKPHOS 100  BILITOT 0.8  PROT 6.1*  ALBUMIN 2.4*   No results for input(s): LIPASE, AMYLASE in the last 168 hours. No results for input(s): AMMONIA in the last 168 hours. Coagulation Profile: No results for input(s): INR, PROTIME in the last 168 hours. Cardiac Enzymes: No  results for input(s): CKTOTAL, CKMB, CKMBINDEX, TROPONINI in the last 168 hours. BNP (last 3 results) No results for input(s): PROBNP in the last 8760 hours. HbA1C: Recent Labs    12/26/17 1218  HGBA1C 8.6*   CBG: Recent Labs  Lab 12/26/17 1840 12/27/17 0013 12/27/17 0503 12/27/17 0812 12/27/17 1237  GLUCAP 150* 151* 122* 98 151*   Lipid Profile: No results for input(s): CHOL, HDL, LDLCALC, TRIG, CHOLHDL, LDLDIRECT in the last 72 hours. Thyroid Function Tests: No results for input(s): TSH, T4TOTAL, FREET4, T3FREE, THYROIDAB in the last 72 hours. Anemia Panel: No results for input(s): VITAMINB12, FOLATE, FERRITIN, TIBC, IRON, RETICCTPCT in the last 72 hours. Urine analysis:    Component Value Date/Time   COLORURINE YELLOW 12/05/2017 1200   APPEARANCEUR HAZY (A) 12/05/2017 1200   LABSPEC 1.012 12/05/2017 1200   PHURINE 5.0 12/05/2017 1200   GLUCOSEU 150 (A) 12/05/2017 1200   HGBUR NEGATIVE 12/05/2017 1200   BILIRUBINUR NEGATIVE 12/05/2017 1200   KETONESUR NEGATIVE 12/05/2017 1200   PROTEINUR 30 (A) 12/05/2017 1200   NITRITE NEGATIVE 12/05/2017 1200   LEUKOCYTESUR NEGATIVE 12/05/2017 1200     Ripudeep Rai M.D. [facility] 12/27/2017, 1:38 PM  Pager: [phone] Between 7am to 7pm - call Pager - [phone]  After 7pm go to [email or web address] - password [email or web address]  Call night coverage person covering after 7pm

## 2017-12-27 NOTE — Progress Notes (Signed)
ANTICOAGULATION CONSULT NOTE - Initial Consult  Pharmacy Consult for heparin Indication: atrial fibrillation  Allergies  Allergen Reactions  . Coreg [Carvedilol] Other (See Comments)    Shortness of breath ,fatigue ,dizzyness   . Lovastatin Other (See Comments)    CK elevation, Myalgias  . Ativan [Lorazepam] Other (See Comments)    Pt. had side effects    Patient Measurements: Height: 5\' 7"  (170.2 cm) Weight: 151 lb (68.5 kg) IBW/kg (Calculated) : 66.1 Heparin Dosing Weight:68.5kg  Vital Signs: Temp: 97.8 F (36.6 C) (02/06 0544) Temp Source: Oral (02/06 0544) BP: 118/90 (02/06 0544) Pulse Rate: 63 (02/06 0544)  Labs: Recent Labs    12/26/17 1218 12/27/17 0521  HGB 9.0* 8.2*  HCT 29.6* 26.2*  PLT 181 155  CREATININE 1.76* 1.47*    Estimated Creatinine Clearance: 38.1 mL/min (A) (by C-G formula based on SCr of 1.47 mg/dL (H)).   Assessment: 81 YOM with recently diagnoses AFib who was started on Eliquis. Eliquis is now on hold in anticipation of patient possibly needing I&D for abscess in abdominal wall. Last dose of Eliquis 2/4 (patient unsure of time).  Baseline Hgb 8.2, plts 155- no bleeding noted  Goal of Therapy:  Heparin level 0.3-0.7 units/ml Monitor platelets by anticoagulation protocol: Yes   Plan:  Start Heparin infusion at 900 units/hr Heparin level and aPTT in 8h Daily heparin level and aPTT until correlating Daily CBC and watch for s/s bleeding Follow procedural plans and ability to resume PO anticoagulation  Tarry Fountain D. Tonjua Rossetti, PharmD, BCPS Clinical Pharmacist Clinical Phone for 12/27/2017 until 3:30pm: [phone] If after 3:30pm, please call main pharmacy at [phone] 12/27/2017 11:02 AM

## 2017-12-27 NOTE — Progress Notes (Signed)
[address]       [place] [address]             [phone]         Subjective: Afebrile, no leukocytosis Feels better, less discomfort  Objective: Vital signs in last 24 hours: Temp:  [97.8 F (36.6 C)] 97.8 F (36.6 C) (02/06 0544) Pulse Rate:  [63-82] 63 (02/06 0544) Cardiac Rhythm: Normal sinus rhythm;Heart block;Bundle branch block (02/05 2244) Resp:  [10-28] 18 (02/06 0544) BP: (114-155)/(41-125) 118/90 (02/06 0544) SpO2:  [86 %-100 %] 99 % (02/06 0544) Weight:  [151 lb (68.5 kg)] 151 lb (68.5 kg) (02/05 1206)  Hemodynamic parameters for last 24 hours:    Intake/Output from previous day: 02/05 0701 - 02/06 0700 In: 1050 [I.V.:500; IV Piggyback:550] Out: -  Intake/Output this shift: No intake/output data recorded.  General appearance: alert, cooperative and no distress Wound: teh cellulitis is slightly less erethematous, there is some purulent drainage from the previous drain site currently  Lab Results: Recent Labs    12/26/17 1218 12/27/17 0521  WBC 10.8* 8.0  HGB 9.0* 8.2*  HCT 29.6* 26.2*  PLT 181 155   BMET:  Recent Labs    12/26/17 1218 12/27/17 0521  NA 137 137  K 3.6 3.0*  CL 88* 91*  CO2 34* 34*  GLUCOSE 234* 126*  BUN 25* 20  CREATININE 1.76* 1.47*  CALCIUM 8.5* 8.1*    PT/INR: No results for input(s): LABPROT, INR in the last 72 hours. ABG    Component Value Date/Time   PHART 7.475 (H) 12/06/2017 1730   HCO3 27.4 12/06/2017 1730   TCO2 32 03/20/2015 1638   O2SAT 98.1 12/06/2017 1730   CBG (last 3)  Recent Labs    12/27/17 0013 12/27/17 0503 12/27/17 0812  GLUCAP 151* 122* 98    Meds Scheduled Meds: . amiodarone  200 mg Oral Daily  . bisoprolol  2.5 mg Oral Daily  . DULoxetine  30 mg Oral Daily  . insulin aspart  0-9 Units Subcutaneous Q4H  . insulin glargine  5 Units Subcutaneous QHS  . mouth rinse  15 mL Mouth Rinse BID  . pantoprazole  40 mg Oral Daily  . pravastatin  40 mg Oral q1800    . senna-docusate  1 tablet Oral BID   Continuous Infusions: . piperacillin-tazobactam (ZOSYN)  IV 3.375 g (12/27/17 0512)  . vancomycin     PRN Meds:.acetaminophen **OR** acetaminophen, HYDROcodone-acetaminophen, methocarbamol, nitroGLYCERIN, ondansetron **OR** ondansetron (ZOFRAN) IV, polyethylene glycol  Xrays Ct Abdomen Pelvis W Contrast  Result Date: 12/26/2017 CLINICAL DATA:  Abdominal pain, fever. EXAM: CT ABDOMEN AND PELVIS WITH CONTRAST TECHNIQUE: Multidetector CT imaging of the abdomen and pelvis was performed using the standard protocol following bolus administration of intravenous contrast. CONTRAST:  65mL ISOVUE-300 IOPAMIDOL (ISOVUE-300) INJECTION 61% COMPARISON:  CT scan of January 12, 2011. Ultrasound of December 05, 2017. FINDINGS: Lower chest: No acute abnormality. Hepatobiliary: Cholelithiasis is noted without inflammation. Liver is unremarkable. Pancreas: Unremarkable. No pancreatic ductal dilatation or surrounding inflammatory changes. Spleen: Normal in size without focal abnormality. Adrenals/Urinary Tract: Adrenal glands appear normal. Small right renal cyst is noted. No hydronephrosis or renal obstruction is noted. No renal or ureteral calculi are noted. Urinary bladder is unremarkable. Stomach/Bowel: Stomach is within normal limits. Appendix appears normal. No evidence of bowel wall thickening, distention, or inflammatory changes. Vascular/Lymphatic: Aortic atherosclerosis. No enlarged abdominal or pelvic lymph nodes. Reproductive: Prostate is unremarkable. Other: There is continued presence  of fluid collection seen around epicardial leads in the left lower quadrant of the abdomen this fluid collection currently measures 8.8 x 3.0 cm. Inflammatory changes are now noted around it and this is concerning for abscess. Please refer to laboratory data from ultrasound-guided aspiration performed on January 15. Musculoskeletal: No acute or significant osseous findings. IMPRESSION: 8.8 x  3.0 cm fluid collection is seen in the subcutaneous tissues of the left lower quadrant the abdomen surrounding epicardial leads. Inflammatory changes noted around this concerning for abscess. This fluid collection was recently aspirated, and correlation with laboratory data is recommended. Cholelithiasis is noted without inflammation. Aortic atherosclerosis. Electronically Signed   By: Marijo Conception, M.D.   On: 12/26/2017 15:04    Assessment/Plan:  1 cellulitis improved, no leukocytosis or fevers, cont current abx- could potentially need I+ D   LOS: 1 day    Victor [name] 12/27/2017

## 2017-12-28 ENCOUNTER — Inpatient Hospital Stay (HOSPITAL_COMMUNITY): Payer: Medicare Other

## 2017-12-28 LAB — COMPREHENSIVE METABOLIC PANEL
ALT: 13 U/L — ABNORMAL LOW (ref 17–63)
ALT: 13 U/L — ABNORMAL LOW (ref 17–63)
AST: 22 U/L (ref 15–41)
AST: 21 U/L (ref 15–41)
Albumin: 2.1 g/dL — ABNORMAL LOW (ref 3.5–5.0)
Albumin: 2.2 g/dL — ABNORMAL LOW (ref 3.5–5.0)
Alkaline Phosphatase: 93 U/L (ref 38–126)
Alkaline Phosphatase: 87 U/L (ref 38–126)
Anion gap: 13 (ref 5–15)
Anion gap: 13 (ref 5–15)
BUN: 22 mg/dL — ABNORMAL HIGH (ref 6–20)
BUN: 23 mg/dL — ABNORMAL HIGH (ref 6–20)
CO2: 33 mmol/L — ABNORMAL HIGH (ref 22–32)
CO2: 31 mmol/L (ref 22–32)
Calcium: 7.9 mg/dL — ABNORMAL LOW (ref 8.9–10.3)
Calcium: 8 mg/dL — ABNORMAL LOW (ref 8.9–10.3)
Chloride: 91 mmol/L — ABNORMAL LOW (ref 101–111)
Chloride: 92 mmol/L — ABNORMAL LOW (ref 101–111)
Creatinine, Ser: 2.12 mg/dL — ABNORMAL HIGH (ref 0.61–1.24)
Creatinine, Ser: 2.15 mg/dL — ABNORMAL HIGH (ref 0.61–1.24)
GFR calc Af Amer: 32 mL/min — ABNORMAL LOW (ref >60)
GFR calc Af Amer: 32 mL/min — ABNORMAL LOW (ref >60)
GFR calc non Af Amer: 28 mL/min — ABNORMAL LOW (ref >60)
GFR calc non Af Amer: 28 mL/min — ABNORMAL LOW (ref >60)
Glucose, Bld: 98 mg/dL (ref 65–99)
Glucose, Bld: 113 mg/dL — ABNORMAL HIGH (ref 65–99)
Potassium: 3.1 mmol/L — ABNORMAL LOW (ref 3.5–5.1)
Potassium: 3.2 mmol/L — ABNORMAL LOW (ref 3.5–5.1)
Sodium: 137 mmol/L (ref 135–145)
Sodium: 136 mmol/L (ref 135–145)
Total Bilirubin: 0.6 mg/dL (ref 0.3–1.2)
Total Bilirubin: 0.6 mg/dL (ref 0.3–1.2)
Total Protein: 5.6 g/dL — ABNORMAL LOW (ref 6.5–8.1)
Total Protein: 5.6 g/dL — ABNORMAL LOW (ref 6.5–8.1)

## 2017-12-28 LAB — BASIC METABOLIC PANEL
Anion gap: 13 (ref 5–15)
BUN: 23 mg/dL — AB (ref 6–20)
CALCIUM: 7.9 mg/dL — AB (ref 8.9–10.3)
CO2: 31 mmol/L (ref 22–32)
CREATININE: 2.3 mg/dL — AB (ref 0.61–1.24)
Chloride: 93 mmol/L — ABNORMAL LOW (ref 101–111)
GFR calc Af Amer: 29 mL/min — ABNORMAL LOW (ref >60)
GFR calc non Af Amer: 25 mL/min — ABNORMAL LOW (ref >60)
GLUCOSE: 159 mg/dL — AB (ref 65–99)
POTASSIUM: 3.8 mmol/L (ref 3.5–5.1)
SODIUM: 137 mmol/L (ref 135–145)

## 2017-12-28 LAB — GLUCOSE, CAPILLARY
GLUCOSE-CAPILLARY: 113 mg/dL — AB (ref 65–99)
GLUCOSE-CAPILLARY: 144 mg/dL — AB (ref 65–99)
Glucose-Capillary: 127 mg/dL — ABNORMAL HIGH (ref 65–99)
Glucose-Capillary: 134 mg/dL — ABNORMAL HIGH (ref 65–99)
Glucose-Capillary: 135 mg/dL — ABNORMAL HIGH (ref 65–99)
Glucose-Capillary: 144 mg/dL — ABNORMAL HIGH (ref 65–99)
Glucose-Capillary: 91 mg/dL (ref 65–99)

## 2017-12-28 LAB — URINALYSIS, ROUTINE W REFLEX MICROSCOPIC
Bilirubin Urine: NEGATIVE
Glucose, UA: NEGATIVE mg/dL
Hgb urine dipstick: NEGATIVE
Ketones, ur: NEGATIVE mg/dL
Leukocytes, UA: NEGATIVE
Nitrite: NEGATIVE
Protein, ur: NEGATIVE mg/dL
Specific Gravity, Urine: 1.026 (ref 1.005–1.030)
pH: 5 (ref 5.0–8.0)

## 2017-12-28 LAB — CBC
HCT: 27.1 % — ABNORMAL LOW (ref 39.0–52.0)
HCT: 27.2 % — ABNORMAL LOW (ref 39.0–52.0)
Hemoglobin: 8.3 g/dL — ABNORMAL LOW (ref 13.0–17.0)
Hemoglobin: 8.9 g/dL — ABNORMAL LOW (ref 13.0–17.0)
MCH: 25.3 pg — ABNORMAL LOW (ref 26.0–34.0)
MCH: 27.1 pg (ref 26.0–34.0)
MCHC: 30.6 g/dL (ref 30.0–36.0)
MCHC: 32.7 g/dL (ref 30.0–36.0)
MCV: 82.6 fL (ref 78.0–100.0)
MCV: 82.9 fL (ref 78.0–100.0)
Platelets: 148 10*3/uL — ABNORMAL LOW (ref 150–400)
Platelets: 142 10*3/uL — ABNORMAL LOW (ref 150–400)
RBC: 3.28 MIL/uL — ABNORMAL LOW (ref 4.22–5.81)
RBC: 3.28 MIL/uL — ABNORMAL LOW (ref 4.22–5.81)
RDW: 14.7 % (ref 11.5–15.5)
RDW: 14.7 % (ref 11.5–15.5)
WBC: 7.2 10*3/uL (ref 4.0–10.5)
WBC: 6 10*3/uL (ref 4.0–10.5)

## 2017-12-28 LAB — PROTIME-INR
INR: 2.03
INR: 1.94
Prothrombin Time: 22.8 seconds — ABNORMAL HIGH (ref 11.4–15.2)
Prothrombin Time: 22 seconds — ABNORMAL HIGH (ref 11.4–15.2)

## 2017-12-28 LAB — SURGICAL PCR SCREEN
MRSA, PCR: NEGATIVE
Staphylococcus aureus: POSITIVE — AB

## 2017-12-28 LAB — APTT
APTT: 116 s — AB (ref 24–36)
aPTT: 79 seconds — ABNORMAL HIGH (ref 24–36)
aPTT: 84 seconds — ABNORMAL HIGH (ref 24–36)

## 2017-12-28 LAB — CREATININE, URINE, RANDOM: CREATININE, URINE: 121.48 mg/dL

## 2017-12-28 LAB — PREPARE RBC (CROSSMATCH)

## 2017-12-28 MED ORDER — DEXTROSE 5 % IV SOLN
1.5000 g | INTRAVENOUS | Status: AC
  Administered 2017-12-29: 1.5 g via INTRAVENOUS
  Filled 2017-12-28: qty 1.5

## 2017-12-28 MED ORDER — SODIUM CHLORIDE 0.9 % IV BOLUS (SEPSIS)
1000.0000 mL | Freq: Once | INTRAVENOUS | Status: AC
  Administered 2017-12-28: 1000 mL via INTRAVENOUS

## 2017-12-28 MED ORDER — POTASSIUM CHLORIDE CRYS ER 20 MEQ PO TBCR
40.0000 meq | EXTENDED_RELEASE_TABLET | Freq: Once | ORAL | Status: AC
  Administered 2017-12-28: 40 meq via ORAL
  Filled 2017-12-28: qty 2

## 2017-12-28 NOTE — Progress Notes (Signed)
[place] for heparin Indication: atrial fibrillation  Allergies  Allergen Reactions  . Coreg [Carvedilol] Other (See Comments)    Shortness of breath ,fatigue ,dizzyness   . Lovastatin Other (See Comments)    CK elevation, Myalgias  . Ativan [Lorazepam] Other (See Comments)    Pt. had side effects    Patient Measurements: Height: 5\' 7"  (170.2 cm) Weight: 151 lb (68.5 kg) IBW/kg (Calculated) : 66.1 Heparin Dosing Weight:68.5kg  Vital Signs: Temp: 97.7 F (36.5 C) (02/06 2227) Temp Source: Oral (02/06 2226) BP: 120/46 (02/06 2227) Pulse Rate: 62 (02/06 2227)  Labs: Recent Labs    12/26/17 1218 12/27/17 0521 12/27/17 1940 12/28/17 0215  HGB 9.0* 8.2*  --  8.3*  HCT 29.6* 26.2*  --  27.1*  PLT 181 155  --  148*  APTT  --   --  99* 116*  LABPROT  --   --   --  22.8*  INR  --   --   --  2.03  HEPARINUNFRC  --   --  >2.20*  --   CREATININE 1.76* 1.47*  --   --     Estimated Creatinine Clearance: 38.1 mL/min (A) (by C-G formula based on SCr of 1.47 mg/dL (H)).   Assessment: 85 YOM with recently diagnoses AFib who was started on Eliquis. Eliquis is now on hold in anticipation of patient possibly needing I&D for abscess in abdominal wall. Last dose of Eliquis 2/4 (patient unsure of time).  This morning aPTT is supratherapeutic.   Goal of Therapy:  Aptt goal 66-102s Heparin level 0.3-0.7 units/ml Monitor platelets by anticoagulation protocol: Yes   Plan:  Reduce heparin to 800 units/hr Confirmatory aPTT this afternoon Daily HL, aPTT, CBC Follow procedural plans   Harvel Quale 12/28/2017 4:37 AM

## 2017-12-28 NOTE — Progress Notes (Signed)
[place] for heparin Indication: atrial fibrillation  Allergies  Allergen Reactions  . Coreg [Carvedilol] Other (See Comments)    Shortness of breath ,fatigue ,dizzyness   . Lovastatin Other (See Comments)    CK elevation, Myalgias  . Ativan [Lorazepam] Other (See Comments)    Pt. had side effects    Patient Measurements: Height: 5\' 7"  (170.2 cm) Weight: 151 lb (68.5 kg) IBW/kg (Calculated) : 66.1 Heparin Dosing Weight:68.5kg  Vital Signs: Temp: 98 F (36.7 C) (02/07 1335) Temp Source: Oral (02/07 1335) BP: 116/54 (02/07 1335) Pulse Rate: 93 (02/07 1335)  Labs: Recent Labs    12/27/17 0521  12/27/17 1940 12/28/17 0215 12/28/17 0749 12/28/17 1326  HGB 8.2*  --   --  8.3* 8.9*  --   HCT 26.2*  --   --  27.1* 27.2*  --   PLT 155  --   --  148* 142*  --   APTT  --    < > 99* 116* 79* 84*  LABPROT  --   --   --  22.8* 22.0*  --   INR  --   --   --  2.03 1.94  --   HEPARINUNFRC  --   --  >2.20*  --   --   --   CREATININE 1.47*  --   --  2.12* 2.15*  --    < > = values in this interval not displayed.    Estimated Creatinine Clearance: 26 mL/min (A) (by C-G formula based on SCr of 2.15 mg/dL (H)).   Assessment: 27 YOM with recently diagnoses AFib who was started on Eliquis. Eliquis is now on hold in anticipation of patient possibly needing I&D for abscess in abdominal wall. Last dose of Eliquis 2/4 (patient unsure of time).  **aPTT is therapeutic this afternoon at 84   Goal of Therapy:  Aptt goal 66-102s Heparin level 0.3-0.7 units/ml Monitor platelets by anticoagulation protocol: Yes   Plan:  Continue heparin at 800 units/hr Daily HL, aPTT, CBC Follow procedural plans   Thank you for allowing [place] to participate in this patients care.  Jens Som, PharmD Clinical phone for 12/28/2017 from 7a-3:30p: [phone] If after 3:30p, please call main pharmacy at: [phone] 12/28/2017 2:13 PM

## 2017-12-28 NOTE — Consult Note (Signed)
   [facility] CM Inpatient Consult   12/28/2017  CASANOVA SCHURMAN 09-07-1936 [id number]    Patient for re-admission less than 30 days [facility]. Patient assessed for re-admission with abdominal abscess includes but not excluded with HX of HF, CABG, diabetes. Patient is in the [place] [facility] the [facility] under patient's Medicare plan.  Spoke with inpatient RNCM regarding re-admissions and in [facility]. Will follow up as patient is likely to have surgical intervention.  For questions contact:   Natividad Brood, RN BSN [facility] Liaison  [phone] business mobile phone Toll free office [phone]

## 2017-12-28 NOTE — Care Management Note (Incomplete)
Case Management Note  Patient Details  Name: PEARCE LITTLEFIELD MRN: [id number] Date of Birth: Jun 21, 1936  Subjective/Objective:          Adbominal wall abcess          Karle Starch (Grandson) [address] [phone]      PCP: Cyndi Bender  Action/Plan:  - I&D for abscess in abdominal wall    Expected Discharge Date:                  Expected Discharge Plan:  [facility]  In-House Referral:  [facility]  Discharge planning Services  CM Consult  Post Acute Care Choice:  Resumption of Svcs/PTA Provider, Home Health Choice offered to:  Patient  DME Arranged:    DME Agency:     HH Arranged:  RN, PT, OT, Nurse's Aide [place] Agency:  [facility]  Status of Service:  In process, will continue to follow  If discussed at Long Length of Stay Meetings, dates discussed:    Additional Comments:  Sharin Mons, RN 12/28/2017, 12:55 PM

## 2017-12-28 NOTE — Progress Notes (Signed)
[facility]                                                                              Patient Demographics  Victor [name], is a 82 y.o. male, DOB - 04/13/1936, JQB:[id number]  Admit date - 12/26/2017   Admitting Physician Ripudeep Krystal Eaton, MD  Outpatient Primary MD for the patient is Cyndi Bender, PA-C  Outpatient specialists:   LOS - 2  days   Medical records reviewed and are as summarized below:    Chief Complaint  Patient presents with  . Abscess       Brief summary   Patient is a 82 year old male with history of CAD status post CABG in 1990, chronic combined systolic and diastolic CHF, ischemic cardiomyopathy, history of VT in 2012, status post ICD, diabetes, insulin-dependent, CKD hypertension, hyperlipidemia who was recently discharged on 12/16/17 by cardiology service.   Patient was found to have fluid collection surrounding the epicardial wires from an abandoned epicardial system.  On 12/11/17, patient underwent irrigation and debridement of the hematoma, JP drain until 1/25 and subsequently Eliquis was resumed on discharge.  Patient also developed atrial fibrillation during hospitalization, was placed on amiodarone with plan for DCCV in 3-4 weeks.  Patient was diuresed for acute on chronic systolic CHF. Patient was not discharged on antibiotics as it was felt that it was a noninfected hematoma around the epicardial wires from previous PPM and abdominal cavity. Patient reported that he was in his baseline state of health once he got home however a couple of days later he started noticing increased redness and pain in the same left lower quadrant abdominal area which was drained earlier.   Patient went to his PCP, per patient the wound was cleaned and he received "shot of antibiotic".  Patient reports that the wound continued to progressively get larger in size, more tender and red, on the morning of admission, he woke up with pain and pus was draining  out.   Assessment & Plan   Left rectus sheath abdominal wall abscess, left lower quadrant -CT abdomen showed 8.8x 3 cm fluid collection in the subcutaneous tissue of the left lower quadrant in the abdomen surrounding epicardial leads concerning for abscess -Patient had I&D of the presumed hematoma on 12/11/17 in the same area, she had a JP drain until 1/25 - CT surgery consulted, recommended IV antibiotics for now and possibly will need I&D - Continue IV vancomycin and Zosyn, previous wound cultures had shown MSSA - Held Eliquis  Active Problems:   Insulin dependent type 2 diabetes mellitus, controlled ([place])  -Continue Lantus, placed on sliding scale insulin -CABG stable. Hemoglobin A1c 8.8 on 1/18    Essential hypertension -Currently stable we will continue bisoprolol -Holding Lasix due to a TIA    GERD (gastroesophageal reflux disease) -Continue PPI    Hx of CAD status post CABG 1990, chronic combined systolic and diastolic CHF, ischemic cardiomyopathy -2D echo on 1/17 had shown EF of 30-35% with diffuse hypokinesis, moderate to severe LV systolic dysfunction. -Currently no chest pain -Hold Lasix, apixaban.  Continue bisoprolol, statin    OSA (obstructive sleep apnea) -Continue CPAP  Mild acute on chronic kidney disease stage 3 (HCC) -Hold Lasix until creatinine function improves, patient reports loss of appetite and not eating much in the last 1 week -Creatinine worse to 2.15, baseline 1.1-1.2, may need to consider stopping vanc or renal consult if no improvement tomorrow - IVF bolus, encouraged PO - US renal showed medical renal dz on 2/7 - bladder scan - PVR    Atrial fibrillation, persistent (HCC) -Continue amiodarone, bisoprolol -Hold Eliquis for possible OR, placed on IV heparin drip while awaiting decision     COPD (chronic obstructive pulmonary disease) (HCC) -Currently stable, no wheezing  Constipation -Per patient did not have BM in the last 2 weeks,  patient received enema  - Constipation resolved, continue scheduled Senokot S and Miralax as needed  Code Status DO NOT RESUSCITATE  DVT Prophylaxis: Heparin drip  Family Communication: Discussed in detail with the patient, all imaging results, lab results explained to the patient    Disposition Plan:   Time Spent in minutes  25 minutes  Procedures:  CT abdomen  Consultants:   CT surgery   Antimicrobials:   IV vancomycin 2/5  IV Zosyn 2/5   Medications  Scheduled Meds: . amiodarone  200 mg Oral Daily  . bisoprolol  2.5 mg Oral Daily  . DULoxetine  30 mg Oral Daily  . insulin aspart  0-9 Units Subcutaneous Q4H  . insulin glargine  5 Units Subcutaneous QHS  . mouth rinse  15 mL Mouth Rinse BID  . pantoprazole  40 mg Oral Daily  . pravastatin  40 mg Oral q1800  . senna-docusate  1 tablet Oral BID   Continuous Infusions: . [START ON 12/29/2017] cefUROXime (ZINACEF)  IV    . heparin 800 Units/hr (12/28/17 0538)  . piperacillin-tazobactam (ZOSYN)  IV 3.375 g (12/28/17 1300)  . vancomycin Stopped (12/27/17 1828)   PRN Meds:.acetaminophen **OR** acetaminophen, HYDROcodone-acetaminophen, methocarbamol, nitroGLYCERIN, ondansetron **OR** ondansetron (ZOFRAN) IV, polyethylene glycol   Antibiotics   Anti-infectives (From admission, onward)   Start     Dose/Rate Route Frequency Ordered Stop   12/29/17 1430  cefUROXime (ZINACEF) 1.5 g in dextrose 5 % 50 mL IVPB     1.5 g 100 mL/hr over 30 Minutes Intravenous 60 min pre-op 12/28/17 0735     12/27/17 1600  vancomycin (VANCOCIN) IVPB 750 mg/150 ml premix     750 mg 150 mL/hr over 60 Minutes Intravenous Every 24 hours 12/26/17 1537     12/26/17 2200  piperacillin-tazobactam (ZOSYN) IVPB 3.375 g     3.375 g 12.5 mL/hr over 240 Minutes Intravenous Every 8 hours 12/26/17 1539     12/26/17 1545  vancomycin (VANCOCIN) 1,500 mg in sodium chloride 0.9 % 500 mL IVPB     1,500 mg 250 mL/hr over 120 Minutes Intravenous  Once 12/26/17  1537 12/26/17 1806   12/26/17 1545  piperacillin-tazobactam (ZOSYN) IVPB 3.375 g  Status:  Discontinued     3.375 g 12.5 mL/hr over 240 Minutes Intravenous Every 8 hours 12/26/17 1537 12/26/17 1539   12/26/17 1515  piperacillin-tazobactam (ZOSYN) IVPB 3.375 g  Status:  Discontinued     3.375 g 100 mL/hr over 30 Minutes Intravenous  Once 12/26/17 1512 12/26/17 1604   12/26/17 1515  vancomycin (VANCOCIN) IVPB 1000 mg/200 mL premix  Status:  Discontinued     1,000 mg 200 mL/hr over 60 Minutes Intravenous  Once 12/26/17 1512 12/26/17 1537        Subjective:   Victor [name] was seen and examined  today.   no fevers, feels is slightly better this morning. Had a bowel movement earlier this morning. Patient denies dizziness, chest pain, shortness of breath, new weakness, numbess, tingling. No acute events overnight.    Objective:   Vitals:   12/27/17 2226 12/27/17 2227 12/28/17 0612 12/28/17 1335  BP: (!) 120/46 (!) 120/46 (!) 126/48 (!) 116/54  Pulse: 61 62 60 93  Resp: 18 18 18 17   Temp: 97.7 F (36.5 C) 97.7 F (36.5 C) 97.7 F (36.5 C) 98 F (36.7 C)  TempSrc: Oral  Oral Oral  SpO2: 100% 100% 98% 94%  Weight:      Height:        Intake/Output Summary (Last 24 hours) at 12/28/2017 1450 Last data filed at 12/28/2017 1400 Gross per 24 hour  Intake 860.8 ml  Output 200 ml  Net 660.8 ml     Wt Readings from Last 3 Encounters:  12/26/17 68.5 kg (151 lb)  12/16/17 73.2 kg (161 lb 6.4 oz)  11/09/17 73.3 kg (161 lb 9.6 oz)     Exam  General: Alert and oriented x 3, NAD  Eyes: EOMI  HEENT:  NCAT  Cardiovascular: S1 S2 clear, ireg ireg  Respiratory: CTAB, no wheezing, rales or rhonchi  Gastrointestinal: Soft, nondistended, + bowel sounds, tender indurated area in the left lower quadrant   Ext: no pedal edema bilaterally  Neuro:no new deficits   Musculoskeletal: No digital cyanosis, clubbing  Skin:Cellulitis with erythematous area in the LLQ, indurated, some  purulent drainage   Psych: Normal affect and demeanor, alert and oriented x3    Data Reviewed:  I have personally reviewed following labs and imaging studies  Micro Results Recent Results (from the past 240 hour(s))  Culture, blood (routine x 2)     Status: None (Preliminary result)   Collection Time: 12/26/17 12:50 PM  Result Value Ref Range Status   Specimen Description LEFT ANTECUBITAL  Final   Special Requests   Final    BOTTLES DRAWN AEROBIC AND ANAEROBIC Blood Culture adequate volume   Culture   Final    NO GROWTH 1 DAY Performed at [facility], [address], [place], [place] [address]    Report Status PENDING  Incomplete  Culture, blood (routine x 2)     Status: None (Preliminary result)   Collection Time: 12/26/17  1:00 PM  Result Value Ref Range Status   Specimen Description BLOOD RIGHT ARM  Final   Special Requests   Final    BOTTLES DRAWN AEROBIC AND ANAEROBIC Blood Culture adequate volume   Culture   Final    NO GROWTH 1 DAY Performed at [facility], [place] [address], [place], [place] [address]    Report Status PENDING  Incomplete  Aerobic Culture (superficial specimen)     Status: None (Preliminary result)   Collection Time: 12/27/17 10:41 AM  Result Value Ref Range Status   Specimen Description ABSCESS ABDOMEN LEFT LOWER  Final   Special Requests NONE  Final   Gram Stain   Final    FEW WBC PRESENT, PREDOMINANTLY PMN FEW GRAM POSITIVE COCCI IN PAIRS Performed at [facility], [place] [address], [place], [place] [address]    Culture MODERATE STAPHYLOCOCCUS AUREUS  Final   Report Status PENDING  Incomplete  Surgical pcr screen     Status: Abnormal   Collection Time: 12/27/17  8:59 PM  Result Value Ref Range Status   MRSA, PCR NEGATIVE NEGATIVE Final  Staphylococcus aureus POSITIVE (A) NEGATIVE Final    Comment: (NOTE) The Xpert SA Assay (FDA approved for NASAL specimens in patients 16 years of age and older), is one component of a  comprehensive surveillance program. It is not intended to diagnose infection nor to guide or monitor treatment. Performed at [facility], [place] [address], [place], [place] [address]     Radiology Reports Dg Chest 2 View  Result Date: 12/28/2017 CLINICAL DATA:  Shortness of breath.  Ischemic cardiomyopathy. EXAM: CHEST  2 VIEW COMPARISON:  12/15/2017. FINDINGS: AICD unchanged. Enlarged cardiomediastinal silhouette. Previous median sternotomy for CABG. Mild vascular congestion without frank edema or consolidation. IMPRESSION: Stable chest. Electronically Signed   By: Staci Righter M.D.   On: 12/28/2017 09:36   Dg Chest 2 View  Result Date: 12/03/2017 CLINICAL DATA:  Left upper chest pain.  Previous rib fractures EXAM: CHEST  2 VIEW COMPARISON:  12/02/2017 FINDINGS: CABG. Left AICD remains in place, unchanged. There is cardiomegaly. Low lung volumes. Left base atelectasis. No effusion or pneumothorax. IMPRESSION: Low lung volumes.  Left base atelectasis. Cardiomegaly. Electronically Signed   By: Rolm Baptise M.D.   On: 12/03/2017 07:13   Ct Abdomen Pelvis W Contrast  Result Date: 12/26/2017 CLINICAL DATA:  Abdominal pain, fever. EXAM: CT ABDOMEN AND PELVIS WITH CONTRAST TECHNIQUE: Multidetector CT imaging of the abdomen and pelvis was performed using the standard protocol following bolus administration of intravenous contrast. CONTRAST:  81mL ISOVUE-300 IOPAMIDOL (ISOVUE-300) INJECTION 61% COMPARISON:  CT scan of January 12, 2011. Ultrasound of December 05, 2017. FINDINGS: Lower chest: No acute abnormality. Hepatobiliary: Cholelithiasis is noted without inflammation. Liver is unremarkable. Pancreas: Unremarkable. No pancreatic ductal dilatation or surrounding inflammatory changes. Spleen: Normal in size without focal abnormality. Adrenals/Urinary Tract: Adrenal glands appear normal. Small right renal cyst is noted. No hydronephrosis or renal obstruction is noted. No renal or ureteral calculi are  noted. Urinary bladder is unremarkable. Stomach/Bowel: Stomach is within normal limits. Appendix appears normal. No evidence of bowel wall thickening, distention, or inflammatory changes. Vascular/Lymphatic: Aortic atherosclerosis. No enlarged abdominal or pelvic lymph nodes. Reproductive: Prostate is unremarkable. Other: There is continued presence of fluid collection seen around epicardial leads in the left lower quadrant of the abdomen this fluid collection currently measures 8.8 x 3.0 cm. Inflammatory changes are now noted around it and this is concerning for abscess. Please refer to laboratory data from ultrasound-guided aspiration performed on January 15. Musculoskeletal: No acute or significant osseous findings. IMPRESSION: 8.8 x 3.0 cm fluid collection is seen in the subcutaneous tissues of the left lower quadrant the abdomen surrounding epicardial leads. Inflammatory changes noted around this concerning for abscess. This fluid collection was recently aspirated, and correlation with laboratory data is recommended. Cholelithiasis is noted without inflammation. Aortic atherosclerosis. Electronically Signed   By: Marijo Conception, M.D.   On: 12/26/2017 15:04   US Renal  Result Date: 12/28/2017 CLINICAL DATA:  Acute kidney injury EXAM: RENAL / URINARY TRACT ULTRASOUND COMPLETE COMPARISON:  CT abdomen 12/26/2017 FINDINGS: Right Kidney: Length: 10.9 cm. Increased renal cortical echogenicity. No mass or hydronephrosis visualized. Left Kidney: Length: 8.4 cm. Increased renal cortical echogenicity. No mass or hydronephrosis visualized. Bladder: Appears normal for degree of bladder distention. Other: Prostatic measures 3.4 x 2.9 x 3.8 cm. Mild splenomegaly measuring 12.6 cm in length. IMPRESSION: 1. No obstructive uropathy. 2. Increased renal cortical echogenicity bilaterally as can be seen with medical renal disease. Electronically Signed   By: Kathreen Devoid  On: 12/28/2017 13:08   US Abdomen Limited  Result  Date: 12/03/2017 CLINICAL DATA:  Left anterior abdominal wall fluid collection seen on CT EXAM: ULTRASOUND ABDOMEN LIMITED COMPARISON:  Chest CT 12/02/2017 FINDINGS: As noted on CT, there is a fluid collection in the anterior abdominal wall surrounding the pacer wires. The fluid collection measures 9.1 x 5.0 x 2.4 cm. Internal echoes noted. IMPRESSION: Complex fluid collection noted around the pacer wires in the anterior abdominal wall measuring up to 9 cm. Electronically Signed   By: Rolm Baptise M.D.   On: 12/03/2017 07:12   US Aspiration  Result Date: 12/05/2017 INDICATION: 82 year old with a fluid collection surrounding epicardial pacer wires in left anterior abdominal wall. EXAM: ULTRASOUND-GUIDED ASPIRATION OF LEFT ABDOMINAL WALL FLUID COLLECTION MEDICATIONS: None ANESTHESIA/SEDATION: None COMPLICATIONS: None immediate. PROCEDURE: Informed written consent was obtained from the patient after a thorough discussion of the procedural risks, benefits and alternatives. All questions were addressed. A timeout was performed prior to the initiation of the procedure. The fluid collection in the left anterior abdominal wall was identified with ultrasound. This area was prepped with chlorhexidine and sterile field was created. Skin was anesthetized with 1% lidocaine. Using ultrasound guidance, an 18 gauge spinal needle was directed into the collection. Very thick brown fluid was aspirated. Very little fluid could be aspirated from this needle. Therefore, a 5 [place] Yueh catheter was directed into the collection with ultrasound guidance. Again, very thick brown fluid was removed. Approximately 30 mL of fluid was able to be removed. The entire collection could not be decompressed. Yueh catheter was removed. Bandage placed over the puncture site. FINDINGS: Complex fluid collection surrounding epicardial pacer wires in left anterior abdominal wall. Yueh catheter was successfully advanced into the collection and very thick  brown fluid was aspirated. 30 mL of fluid was removed. There was still a large amount of fluid remaining in this collection at the end of the procedure. IMPRESSION: Ultrasound-guided aspiration of the complex fluid collection in the left anterior abdominal wall surrounding epicardial pacer wires. Very thick brown fluid was removed from this collection and sent for culture. Based on the fluid consistency, the patient will likely need surgical intervention to remove all the fluid. Electronically Signed   By: Markus Daft M.D.   On: 12/05/2017 20:24   Dg Chest Port 1 View  Result Date: 12/15/2017 CLINICAL DATA:  Shortness of breath.  History of AFib. EXAM: PORTABLE CHEST 1 VIEW COMPARISON:  12/09/2017 FINDINGS: Stable postsurgical changes from CABG. Stable transvenous and direct epicardial pacer leads. Enlarged cardiac silhouette. Mediastinal contours appear intact. Calcific atherosclerotic disease of the aorta. There is no evidence of pneumothorax. No frank pulmonary edema. Low lung volumes with mild prominence of the interstitium. Persistent peribronchial airspace opacities in the left lower lobe. Possible small left pleural effusion. Osseous structures are without acute abnormality. Soft tissues are grossly normal. IMPRESSION: Low lung volumes with mild pulmonary vascular congestion. Enlarged cardiac silhouette. Persistent left lower lobe peribronchial airspace consolidation versus atelectasis. Possible small left pleural effusion. Electronically Signed   By: Fidela Salisbury M.D.   On: 12/15/2017 12:01   Dg Chest Port 1 View  Result Date: 12/09/2017 CLINICAL DATA:  Shortness of breath EXAM: PORTABLE CHEST 1 VIEW COMPARISON:  12/06/2017 FINDINGS: Chronic cardiomegaly. Status post CABG. There is trans venous and direct epicardial pacer leads in stable position. Low volume chest with interstitial crowding and asymmetric streaky retrocardiac density. There is no edema, air bronchogram, effusion, or  pneumothorax. IMPRESSION: Stable low  lung volumes with probable retrocardiac atelectasis. Electronically Signed   By: Monte Fantasia M.D.   On: 12/09/2017 19:48   Dg Chest Port 1 View  Result Date: 12/06/2017 CLINICAL DATA:  Shortness of breath EXAM: PORTABLE CHEST 1 VIEW COMPARISON:  12/03/2017 FINDINGS: Bilateral diffuse mild interstitial thickening. No pleural effusion or pneumothorax. No focal consolidation. Stable cardiomegaly. Prior CABG. Single lead cardiac pacemaker. No acute osseous abnormality. IMPRESSION: Cardiomegaly with pulmonary vascular congestion. Electronically Signed   By: Kathreen Devoid   On: 12/06/2017 20:18    Lab Data:  CBC: Recent Labs  Lab 12/26/17 1218 12/27/17 0521 12/28/17 0215 12/28/17 0749  WBC 10.8* 8.0 7.2 6.0  NEUTROABS 8.9*  --   --   --   HGB 9.0* 8.2* 8.3* 8.9*  HCT 29.6* 26.2* 27.1* 27.2*  MCV 82.9 83.4 82.6 82.9  PLT 181 155 148* 299*   Basic Metabolic Panel: Recent Labs  Lab 12/26/17 1218 12/27/17 0521 12/28/17 0215 12/28/17 0749  NA 137 137 137 136  K 3.6 3.0* 3.1* 3.2*  CL 88* 91* 91* 92*  CO2 34* 34* 33* 31  GLUCOSE 234* 126* 98 113*  BUN 25* 20 22* 23*  CREATININE 1.76* 1.47* 2.12* 2.15*  CALCIUM 8.5* 8.1* 7.9* 8.0*   GFR: Estimated Creatinine Clearance: 26 mL/min (A) (by C-G formula based on SCr of 2.15 mg/dL (H)). Liver Function Tests: Recent Labs  Lab 12/26/17 1218 12/28/17 0215 12/28/17 0749  AST 22 22 21   ALT 14* 13* 13*  ALKPHOS 100 93 87  BILITOT 0.8 0.6 0.6  PROT 6.1* 5.6* 5.6*  ALBUMIN 2.4* 2.1* 2.2*   No results for input(s): LIPASE, AMYLASE in the last 168 hours. No results for input(s): AMMONIA in the last 168 hours. Coagulation Profile: Recent Labs  Lab 12/28/17 0215 12/28/17 0749  INR 2.03 1.94   Cardiac Enzymes: No results for input(s): CKTOTAL, CKMB, CKMBINDEX, TROPONINI in the last 168 hours. BNP (last 3 results) No results for input(s): PROBNP in the last 8760 hours. HbA1C: Recent Labs     12/26/17 1218  HGBA1C 8.6*   CBG: Recent Labs  Lab 12/27/17 2224 12/28/17 0047 12/28/17 0412 12/28/17 0750 12/28/17 1157  GLUCAP 156* 134* 91 113* 135*   Lipid Profile: No results for input(s): CHOL, HDL, LDLCALC, TRIG, CHOLHDL, LDLDIRECT in the last 72 hours. Thyroid Function Tests: No results for input(s): TSH, T4TOTAL, FREET4, T3FREE, THYROIDAB in the last 72 hours. Anemia Panel: No results for input(s): VITAMINB12, FOLATE, FERRITIN, TIBC, IRON, RETICCTPCT in the last 72 hours. Urine analysis:    Component Value Date/Time   COLORURINE YELLOW 12/28/2017 0016   APPEARANCEUR CLOUDY (A) 12/28/2017 0016   LABSPEC 1.026 12/28/2017 0016   PHURINE 5.0 12/28/2017 0016   GLUCOSEU NEGATIVE 12/28/2017 0016   HGBUR NEGATIVE 12/28/2017 0016   BILIRUBINUR NEGATIVE 12/28/2017 0016   KETONESUR NEGATIVE 12/28/2017 0016   PROTEINUR NEGATIVE 12/28/2017 0016   NITRITE NEGATIVE 12/28/2017 0016   LEUKOCYTESUR NEGATIVE 12/28/2017 0016     Elwin Mocha M.D. [facility] 12/28/2017, 2:50 PM  Pager: AMION Between 7am to 7pm - call Pager - [phone]  After 7pm go to [email or web address] - password [email or web address]  Call night coverage person covering after 7pm

## 2017-12-28 NOTE — Progress Notes (Addendum)
[address]       [place] [address]             [phone]        Procedure(s) (LRB): IRRIGATION AND DEBRIDEMENT ABDOMINAL WALL ABSCESS (N/A) Subjective: Feels less discomfort  Objective: Vital signs in last 24 hours: Temp:  [97.7 F (36.5 C)-98 F (36.7 C)] 98 F (36.7 C) (02/07 1335) Pulse Rate:  [60-93] 93 (02/07 1335) Cardiac Rhythm: Ventricular paced (02/07 0906) Resp:  [17-18] 17 (02/07 1335) BP: (116-126)/(46-54) 116/54 (02/07 1335) SpO2:  [94 %-100 %] 94 % (02/07 1335)  Hemodynamic parameters for last 24 hours:    Intake/Output from previous day: 02/06 0701 - 02/07 0700 In: 620.8 [P.O.:250; I.V.:70.8; IV Piggyback:300] Out: -  Intake/Output this shift: Total I/O In: 240 [P.O.:240] Out: -   General appearance: alert, cooperative and no distress Wound: dressing CDI  Lab Results: Recent Labs    12/28/17 0215 12/28/17 0749  WBC 7.2 6.0  HGB 8.3* 8.9*  HCT 27.1* 27.2*  PLT 148* 142*   BMET:  Recent Labs    12/28/17 0215 12/28/17 0749  NA 137 136  K 3.1* 3.2*  CL 91* 92*  CO2 33* 31  GLUCOSE 98 113*  BUN 22* 23*  CREATININE 2.12* 2.15*  CALCIUM 7.9* 8.0*    PT/INR:  Recent Labs    12/28/17 0749  LABPROT 22.0*  INR 1.94   ABG    Component Value Date/Time   PHART 7.475 (H) 12/06/2017 1730   HCO3 27.4 12/06/2017 1730   TCO2 32 03/20/2015 1638   O2SAT 98.1 12/06/2017 1730   CBG (last 3)  Recent Labs    12/28/17 0412 12/28/17 0750 12/28/17 1157  GLUCAP 91 113* 135*    Meds Scheduled Meds: . amiodarone  200 mg Oral Daily  . bisoprolol  2.5 mg Oral Daily  . DULoxetine  30 mg Oral Daily  . insulin aspart  0-9 Units Subcutaneous Q4H  . insulin glargine  5 Units Subcutaneous QHS  . mouth rinse  15 mL Mouth Rinse BID  . pantoprazole  40 mg Oral Daily  . pravastatin  40 mg Oral q1800  . senna-docusate  1 tablet Oral BID   Continuous Infusions: . [START ON 12/29/2017] cefUROXime (ZINACEF)  IV    .  heparin 800 Units/hr (12/28/17 0538)  . piperacillin-tazobactam (ZOSYN)  IV 3.375 g (12/28/17 1300)  . vancomycin Stopped (12/27/17 1828)   PRN Meds:.acetaminophen **OR** acetaminophen, HYDROcodone-acetaminophen, methocarbamol, nitroGLYCERIN, ondansetron **OR** ondansetron (ZOFRAN) IV, polyethylene glycol  Xrays Dg Chest 2 View  Result Date: 12/28/2017 CLINICAL DATA:  Shortness of breath.  Ischemic cardiomyopathy. EXAM: CHEST  2 VIEW COMPARISON:  12/15/2017. FINDINGS: AICD unchanged. Enlarged cardiomediastinal silhouette. Previous median sternotomy for CABG. Mild vascular congestion without frank edema or consolidation. IMPRESSION: Stable chest. Electronically Signed   By: Staci Righter M.D.   On: 12/28/2017 09:36   Ct Abdomen Pelvis W Contrast  Result Date: 12/26/2017 CLINICAL DATA:  Abdominal pain, fever. EXAM: CT ABDOMEN AND PELVIS WITH CONTRAST TECHNIQUE: Multidetector CT imaging of the abdomen and pelvis was performed using the standard protocol following bolus administration of intravenous contrast. CONTRAST:  55mL ISOVUE-300 IOPAMIDOL (ISOVUE-300) INJECTION 61% COMPARISON:  CT scan of January 12, 2011. Ultrasound of December 05, 2017. FINDINGS: Lower chest: No acute abnormality. Hepatobiliary: Cholelithiasis is noted without inflammation. Liver is unremarkable. Pancreas: Unremarkable. No pancreatic ductal dilatation or surrounding inflammatory changes. Spleen: Normal in size without focal abnormality. Adrenals/Urinary  Tract: Adrenal glands appear normal. Small right renal cyst is noted. No hydronephrosis or renal obstruction is noted. No renal or ureteral calculi are noted. Urinary bladder is unremarkable. Stomach/Bowel: Stomach is within normal limits. Appendix appears normal. No evidence of bowel wall thickening, distention, or inflammatory changes. Vascular/Lymphatic: Aortic atherosclerosis. No enlarged abdominal or pelvic lymph nodes. Reproductive: Prostate is unremarkable. Other: There is  continued presence of fluid collection seen around epicardial leads in the left lower quadrant of the abdomen this fluid collection currently measures 8.8 x 3.0 cm. Inflammatory changes are now noted around it and this is concerning for abscess. Please refer to laboratory data from ultrasound-guided aspiration performed on January 15. Musculoskeletal: No acute or significant osseous findings. IMPRESSION: 8.8 x 3.0 cm fluid collection is seen in the subcutaneous tissues of the left lower quadrant the abdomen surrounding epicardial leads. Inflammatory changes noted around this concerning for abscess. This fluid collection was recently aspirated, and correlation with laboratory data is recommended. Cholelithiasis is noted without inflammation. Aortic atherosclerosis. Electronically Signed   By: Marijo Conception, M.D.   On: 12/26/2017 15:04   US Renal  Result Date: 12/28/2017 CLINICAL DATA:  Acute kidney injury EXAM: RENAL / URINARY TRACT ULTRASOUND COMPLETE COMPARISON:  CT abdomen 12/26/2017 FINDINGS: Right Kidney: Length: 10.9 cm. Increased renal cortical echogenicity. No mass or hydronephrosis visualized. Left Kidney: Length: 8.4 cm. Increased renal cortical echogenicity. No mass or hydronephrosis visualized. Bladder: Appears normal for degree of bladder distention. Other: Prostatic measures 3.4 x 2.9 x 3.8 cm. Mild splenomegaly measuring 12.6 cm in length. IMPRESSION: 1. No obstructive uropathy. 2. Increased renal cortical echogenicity bilaterally as can be seen with medical renal disease. Electronically Signed   By: Kathreen Devoid   On: 12/28/2017 13:08    Assessment/Plan: S/P Procedure(s) (LRB): IRRIGATION AND DEBRIDEMENT ABDOMINAL WALL ABSCESS (N/A)  1 stable overall on current RX/TX, plans for I+ D, Vac placement tomorrow   LOS: 2 days    [name] 12/28/2017  Staph aureus growing from old AICD pocket which had been previously drained for painful hematoma Patient scheduled for drainage  irrigation and probable wound VAC packing of abdominal wound in OR tomorrow IV heparin to stop at midnight Continue IV antibiotics Procedure, benefits, expected postoperative course and potential risks discussed with patient.  patient examined and medical record reviewed,agree with above note. Tharon Aquas Trigt III 12/28/2017

## 2017-12-29 ENCOUNTER — Inpatient Hospital Stay (HOSPITAL_COMMUNITY): Payer: Medicare Other | Admitting: Certified Registered"

## 2017-12-29 ENCOUNTER — Encounter (HOSPITAL_COMMUNITY): Payer: Self-pay | Admitting: *Deleted

## 2017-12-29 ENCOUNTER — Other Ambulatory Visit: Payer: Self-pay | Admitting: Nurse Practitioner

## 2017-12-29 ENCOUNTER — Encounter (HOSPITAL_COMMUNITY)
Admission: EM | Disposition: A | Discharge status: Skilled Nursing Facility | Payer: Self-pay | Source: Home / Self Care | Attending: Internal Medicine

## 2017-12-29 DIAGNOSIS — L02211 Cutaneous abscess of abdominal wall: Secondary | ICD-10-CM

## 2017-12-29 HISTORY — PX: I & D EXTREMITY: SHX5045

## 2017-12-29 HISTORY — PX: APPLICATION OF WOUND VAC: SHX5189

## 2017-12-29 LAB — GLUCOSE, CAPILLARY
GLUCOSE-CAPILLARY: 88 mg/dL (ref 65–99)
GLUCOSE-CAPILLARY: 75 mg/dL (ref 65–99)
Glucose-Capillary: 73 mg/dL (ref 65–99)
Glucose-Capillary: 81 mg/dL (ref 65–99)
Glucose-Capillary: 88 mg/dL (ref 65–99)

## 2017-12-29 LAB — AEROBIC CULTURE W GRAM STAIN (SUPERFICIAL SPECIMEN)

## 2017-12-29 LAB — HEPARIN LEVEL (UNFRACTIONATED): Heparin Unfractionated: >2.2 IU/mL — ABNORMAL HIGH (ref 0.30–0.70)

## 2017-12-29 LAB — BASIC METABOLIC PANEL
ANION GAP: 15 (ref 5–15)
BUN: 20 mg/dL (ref 6–20)
CO2: 28 mmol/L (ref 22–32)
Calcium: 8.3 mg/dL — ABNORMAL LOW (ref 8.9–10.3)
Chloride: 96 mmol/L — ABNORMAL LOW (ref 101–111)
Creatinine, Ser: 1.97 mg/dL — ABNORMAL HIGH (ref 0.61–1.24)
GFR, EST AFRICAN AMERICAN: 35 mL/min — AB (ref >60)
GFR, EST NON AFRICAN AMERICAN: 31 mL/min — AB (ref >60)
GLUCOSE: 94 mg/dL (ref 65–99)
POTASSIUM: 3.6 mmol/L (ref 3.5–5.1)
Sodium: 139 mmol/L (ref 135–145)

## 2017-12-29 LAB — CBC WITH DIFFERENTIAL/PLATELET
Basophils Absolute: 0 10*3/uL (ref 0.0–0.1)
Basophils Relative: 1 %
EOS ABS: 0.2 10*3/uL (ref 0.0–0.7)
Eosinophils Relative: 3 %
HEMATOCRIT: 30.7 % — AB (ref 39.0–52.0)
HEMOGLOBIN: 9.4 g/dL — AB (ref 13.0–17.0)
LYMPHS ABS: 1 10*3/uL (ref 0.7–4.0)
Lymphocytes Relative: 15 %
MCH: 25.3 pg — AB (ref 26.0–34.0)
MCHC: 30.6 g/dL (ref 30.0–36.0)
MCV: 82.5 fL (ref 78.0–100.0)
Monocytes Absolute: 0.9 10*3/uL (ref 0.1–1.0)
Monocytes Relative: 13 %
NEUTROS ABS: 4.7 10*3/uL (ref 1.7–7.7)
NEUTROS PCT: 68 %
Platelets: 188 10*3/uL (ref 150–400)
RBC: 3.72 MIL/uL — AB (ref 4.22–5.81)
RDW: 14.6 % (ref 11.5–15.5)
WBC: 6.8 10*3/uL (ref 4.0–10.5)

## 2017-12-29 LAB — APTT: aPTT: 48 seconds — ABNORMAL HIGH (ref 24–36)

## 2017-12-29 LAB — VANCOMYCIN, TROUGH: Vancomycin Tr: 17 ug/mL (ref 15–20)

## 2017-12-29 LAB — AEROBIC CULTURE  (SUPERFICIAL SPECIMEN)

## 2017-12-29 LAB — UREA NITROGEN, URINE: Urea Nitrogen, Ur: 448 mg/dL

## 2017-12-29 SURGERY — IRRIGATION AND DEBRIDEMENT EXTREMITY
Anesthesia: General | Site: Abdomen | Laterality: Left

## 2017-12-29 MED ORDER — SODIUM CHLORIDE 0.9 % IV SOLN
Freq: Once | INTRAVENOUS | Status: DC
  Filled 2017-12-29: qty 1000

## 2017-12-29 MED ORDER — 0.9 % SODIUM CHLORIDE (POUR BTL) OPTIME
TOPICAL | Status: DC | PRN
  Administered 2017-12-29: 1000 mL

## 2017-12-29 MED ORDER — MUPIROCIN 2 % EX OINT
TOPICAL_OINTMENT | CUTANEOUS | Status: AC
  Administered 2017-12-29: 1 via TOPICAL
  Filled 2017-12-29: qty 22

## 2017-12-29 MED ORDER — SODIUM CHLORIDE 0.9 % IV SOLN
INTRAVENOUS | Status: DC | PRN
  Administered 2017-12-29: 18:00:00

## 2017-12-29 MED ORDER — FENTANYL CITRATE (PF) 100 MCG/2ML IJ SOLN
25.0000 ug | INTRAMUSCULAR | Status: DC | PRN
  Administered 2017-12-29 (×3): 50 ug via INTRAVENOUS

## 2017-12-29 MED ORDER — FENTANYL CITRATE (PF) 100 MCG/2ML IJ SOLN
INTRAMUSCULAR | Status: AC
  Administered 2017-12-29: 50 ug via INTRAVENOUS
  Filled 2017-12-29: qty 2

## 2017-12-29 MED ORDER — ONDANSETRON HCL 4 MG/2ML IJ SOLN: 4.0000 mg | Freq: Once | INTRAMUSCULAR | Status: DC | PRN

## 2017-12-29 MED ORDER — DEXAMETHASONE SODIUM PHOSPHATE 4 MG/ML IJ SOLN
INTRAMUSCULAR | Status: DC | PRN
  Administered 2017-12-29: 8 mg via INTRAVENOUS

## 2017-12-29 MED ORDER — PHENYLEPHRINE 40 MCG/ML (10ML) SYRINGE FOR IV PUSH (FOR BLOOD PRESSURE SUPPORT)
PREFILLED_SYRINGE | INTRAVENOUS | Status: AC
  Filled 2017-12-29: qty 10

## 2017-12-29 MED ORDER — MUPIROCIN 2 % EX OINT
1.0000 "application " | TOPICAL_OINTMENT | Freq: Two times a day (BID) | CUTANEOUS | Status: DC
  Administered 2017-12-29: 1 via TOPICAL

## 2017-12-29 MED ORDER — LIDOCAINE 2% (20 MG/ML) 5 ML SYRINGE
INTRAMUSCULAR | Status: DC | PRN
  Administered 2017-12-29: 50 mg via INTRAVENOUS

## 2017-12-29 MED ORDER — FENTANYL CITRATE (PF) 100 MCG/2ML IJ SOLN
INTRAMUSCULAR | Status: DC | PRN
  Administered 2017-12-29 (×5): 50 ug via INTRAVENOUS

## 2017-12-29 MED ORDER — EPHEDRINE SULFATE-NACL 50-0.9 MG/10ML-% IV SOSY
PREFILLED_SYRINGE | INTRAVENOUS | Status: DC | PRN
  Administered 2017-12-29 (×2): 5 mg via INTRAVENOUS

## 2017-12-29 MED ORDER — ONDANSETRON HCL 4 MG/2ML IJ SOLN
INTRAMUSCULAR | Status: AC
  Filled 2017-12-29: qty 2

## 2017-12-29 MED ORDER — FENTANYL CITRATE (PF) 250 MCG/5ML IJ SOLN
INTRAMUSCULAR | Status: AC
  Filled 2017-12-29: qty 5

## 2017-12-29 MED ORDER — LACTATED RINGERS IV SOLN
INTRAVENOUS | Status: DC | PRN
  Administered 2017-12-29: 17:00:00 via INTRAVENOUS

## 2017-12-29 MED ORDER — LIDOCAINE 2% (20 MG/ML) 5 ML SYRINGE
INTRAMUSCULAR | Status: AC
  Filled 2017-12-29: qty 5

## 2017-12-29 MED ORDER — ONDANSETRON HCL 4 MG/2ML IJ SOLN
INTRAMUSCULAR | Status: DC | PRN
  Administered 2017-12-29: 4 mg via INTRAVENOUS

## 2017-12-29 MED ORDER — PHENYLEPHRINE HCL 10 MG/ML IJ SOLN
INTRAVENOUS | Status: DC | PRN
  Administered 2017-12-29: 40 ug/min via INTRAVENOUS

## 2017-12-29 MED ORDER — VANCOMYCIN HCL IN DEXTROSE 1-5 GM/200ML-% IV SOLN
INTRAVENOUS | Status: AC
  Filled 2017-12-29: qty 200

## 2017-12-29 MED ORDER — PHENYLEPHRINE 40 MCG/ML (10ML) SYRINGE FOR IV PUSH (FOR BLOOD PRESSURE SUPPORT)
PREFILLED_SYRINGE | INTRAVENOUS | Status: DC | PRN
  Administered 2017-12-29 (×2): 80 ug via INTRAVENOUS

## 2017-12-29 MED ORDER — DEXAMETHASONE SODIUM PHOSPHATE 10 MG/ML IJ SOLN
INTRAMUSCULAR | Status: AC
  Filled 2017-12-29: qty 1

## 2017-12-29 MED ORDER — EPHEDRINE 5 MG/ML INJ
INTRAVENOUS | Status: AC
  Filled 2017-12-29: qty 10

## 2017-12-29 MED ORDER — PROPOFOL 10 MG/ML IV BOLUS
INTRAVENOUS | Status: DC | PRN
  Administered 2017-12-29: 120 mg via INTRAVENOUS

## 2017-12-29 MED ORDER — VANCOMYCIN HCL IN DEXTROSE 750-5 MG/150ML-% IV SOLN
750.0000 mg | INTRAVENOUS | Status: DC
  Administered 2017-12-29: 750 mg via INTRAVENOUS
  Filled 2017-12-29 (×2): qty 150

## 2017-12-29 MED ORDER — VANCOMYCIN HCL 1000 MG IV SOLR
INTRAVENOUS | Status: AC
  Filled 2017-12-29: qty 1000

## 2017-12-29 MED ORDER — SODIUM CHLORIDE 0.9 % IV SOLN
INTRAVENOUS | Status: DC
  Administered 2017-12-29 – 2018-01-09 (×4): via INTRAVENOUS
  Filled 2017-12-29: qty 1000

## 2017-12-29 SURGICAL SUPPLY — 28 items
BANDAGE ACE 4X5 VEL STRL LF (GAUZE/BANDAGES/DRESSINGS) IMPLANT
BANDAGE ACE 6X5 VEL STRL LF (GAUZE/BANDAGES/DRESSINGS) IMPLANT
BNDG GAUZE ELAST 4 BULKY (GAUZE/BANDAGES/DRESSINGS) IMPLANT
CANISTER SUCT 3000ML PPV (MISCELLANEOUS) ×3 IMPLANT
COVER SURGICAL LIGHT HANDLE (MISCELLANEOUS) ×6 IMPLANT
DRSG AQUACEL AG ADV 3.5X14 (GAUZE/BANDAGES/DRESSINGS) ×3 IMPLANT
DRSG VAC ATS MED SENSATRAC (GAUZE/BANDAGES/DRESSINGS) ×2 IMPLANT
ELECT REM PT RETURN 9FT ADLT (ELECTROSURGICAL) ×3
ELECTRODE REM PT RTRN 9FT ADLT (ELECTROSURGICAL) ×1 IMPLANT
GAUZE SPONGE 4X4 12PLY STRL (GAUZE/BANDAGES/DRESSINGS) ×3 IMPLANT
GLOVE BIO SURGEON STRL SZ7.5 (GLOVE) ×6 IMPLANT
GOWN STRL REUS W/ TWL LRG LVL3 (GOWN DISPOSABLE) ×2 IMPLANT
GOWN STRL REUS W/TWL LRG LVL3 (GOWN DISPOSABLE) ×6
KIT BASIN OR (CUSTOM PROCEDURE TRAY) ×3 IMPLANT
KIT ROOM TURNOVER OR (KITS) ×3 IMPLANT
NS IRRIG 1000ML POUR BTL (IV SOLUTION) ×3 IMPLANT
PACK GENERAL/GYN (CUSTOM PROCEDURE TRAY) ×3 IMPLANT
PAD ARMBOARD 7.5X6 YLW CONV (MISCELLANEOUS) ×6 IMPLANT
STAPLER VISISTAT 35W (STAPLE) IMPLANT
SUT VIC AB 2-0 CTX 36 (SUTURE) IMPLANT
SUT VIC AB 3-0 SH 27 (SUTURE)
SUT VIC AB 3-0 SH 27X BRD (SUTURE) IMPLANT
SUT VIC AB 3-0 X1 27 (SUTURE) ×3 IMPLANT
SYR BULB IRRIGATION 50ML (SYRINGE) ×2 IMPLANT
TOWEL GREEN STERILE (TOWEL DISPOSABLE) ×3 IMPLANT
TOWEL GREEN STERILE FF (TOWEL DISPOSABLE) ×3 IMPLANT
WATER STERILE IRR 1000ML POUR (IV SOLUTION) ×3 IMPLANT
WND VAC CANISTER 500ML (MISCELLANEOUS) ×2 IMPLANT

## 2017-12-29 NOTE — Anesthesia Procedure Notes (Signed)
Procedure Name: LMA Insertion Date/Time: 12/29/2017 5:43 PM Performed by: Orlie Dakin, CRNA Pre-anesthesia Checklist: Patient identified, Emergency Drugs available, Suction available, Patient being monitored and Timeout performed Patient Re-evaluated:Patient Re-evaluated prior to induction Oxygen Delivery Method: Circle system utilized Preoxygenation: Pre-oxygenation with 100% oxygen Induction Type: IV induction Ventilation: Mask ventilation without difficulty LMA: LMA inserted LMA Size: 5.0 Tube type: Oral Number of attempts: 1 Placement Confirmation: positive ETCO2 and breath sounds checked- equal and bilateral Tube secured with: Tape Dental Injury: Teeth and Oropharynx as per pre-operative assessment  Comments: Very very poor dentition noted pre-op, many missing, broken off and dark brown.  None loose per patient report.

## 2017-12-29 NOTE — Progress Notes (Signed)
PROGRESS NOTE  Victor [name] BJS:[id number] DOB: 18-Jan-1936 DOA: 12/26/2017 PCP: Cyndi Bender, PA-C  HPI/Recap of past 24 hours:  Victor [name] is a 82 y.o. year old male with medical history significant for atrial fibrillation jon eliquis, CAD s/p CABG ( 1990), chronic combined systolic/diastolic CHF s/p ICD, history of VT ( 2012), T2DM, CKD Stage3, HTN, HLD who presented on 12/26/2017 with subjective fevers, chills , and increased drainage, redness an pain of recently drained hematoma and was found to have left rectus sheath abdominal wall abscess.   Subjective Today no complaints. Ready to go to OR. No abdominal pain some tightness is what he describes. No cough, SOB, or chest pain.    Assessment/Plan: Principal Problem:   Abdominal wall abscess Active Problems:   Insulin dependent type 2 diabetes mellitus, controlled (HCC)   Essential hypertension   GERD (gastroesophageal reflux disease)   Hx of CABG 1990   OSA (obstructive sleep apnea)   Stage 3 chronic kidney disease (HCC)   AKI (acute kidney injury) ([place])   Atrial fibrillation, persistent (HCC)   COPD (chronic obstructive pulmonary disease) (HCC)  Left rectus sheath abdominal wall abscess S/p incision and drainage of hematoma for pain relief on 1/21 and JP drain until 1/25. CTS consulted on admission and plan for I&D today. Patient has been empirically on vancomycin and zosyn though culture data does not show MRSA ( no further need for vancomycin)  AKI on CKD Stage 3, worsening Creatinine elevated at 2.3 ( up from baseline of 1.4-1.7). Most likely related to vancomycin. This has since been discontinued as previous culture data would suggest likely MSSA. F/u vancomycin trough this afternoon. Expect improvement with discontinuation of vancomycin. Follow up BMP. If continues to increase will consult nephrology  Atrial Fibrillation, rate controlled New onset from previous hospitalization on 11/2017. Continue bisoprolol and  Amiodarone The previous plan was for DCCV in 3-4 weeks as an outpatient. Holding home eliquis in light of OR today  Combined systolic/diastolic CHF, stable Normal volume status. Will continue to monitor  COPD on home oxygen 3L, stable No wheezing or respiratory distress.   T2DM, controlled, A1c 8.6(12/2017) Lantus 5 unitis, monitor blood glucose, sliding scale  HLD, stable Continue home statin  GERD, stable Continue protonix  Code Status: DNR  Family Communication: NO family at bedside  Disposition Plan: plan for OR    Consultants:  CT surgery  Procedures: None  Antimicrobials:  IV vancomycin 2/5--  IV Zosyn 2/5--    Cultures:  Superficial culture 12/27/17: MSSA(pan sensitive)  DVT prophylaxis: On home eliquis, currently holding since going to OR   Objective: Vitals:   12/28/17 0612 12/28/17 1335 12/28/17 2148 12/29/17 0432  BP: (!) 126/48 (!) 116/54 (!) 133/52 (!) 114/48  Pulse: 60 93 (!) 58 (!) 56  Resp: 18 17 18 18   Temp: 97.7 F (36.5 C) 98 F (36.7 C) 97.9 F (36.6 C) (!) 97.4 F (36.3 C)  TempSrc: Oral Oral Oral Oral  SpO2: 98% 94% 96% 99%  Weight:      Height:        Intake/Output Summary (Last 24 hours) at 12/29/2017 1531 Last data filed at 12/29/2017 1132 Gross per 24 hour  Intake -  Output 275 ml  Net -275 ml   Filed Weights   12/26/17 1206  Weight: 68.5 kg (151 lb)    Exam:  Gen. Alert, lying in bed, no distress Resp. 3 L [place], no wheezing or crackles, no distress CV. Irregularly irregular rhythm,  normal rate, SEM 3/6 loudest in LUSB GI. Soft abdomen, dressing in place on llq of abdomen which is c/d/i, no tenderness Extremities. Trace pitting edema bilaterally  Data Reviewed: CBC: Recent Labs  Lab 12/26/17 1218 12/27/17 0521 12/28/17 0215 12/28/17 0749 12/29/17 0707  WBC 10.8* 8.0 7.2 6.0 6.8  NEUTROABS 8.9*  --   --   --  4.7  HGB 9.0* 8.2* 8.3* 8.9* 9.4*  HCT 29.6* 26.2* 27.1* 27.2* 30.7*  MCV 82.9 83.4 82.6 82.9  82.5  PLT 181 155 148* 142* 824   Basic Metabolic Panel: Recent Labs  Lab 12/26/17 1218 12/27/17 0521 12/28/17 0215 12/28/17 0749 12/28/17 1923  NA 137 137 137 136 137  K 3.6 3.0* 3.1* 3.2* 3.8  CL 88* 91* 91* 92* 93*  CO2 34* 34* 33* 31 31  GLUCOSE 234* 126* 98 113* 159*  BUN 25* 20 22* 23* 23*  CREATININE 1.76* 1.47* 2.12* 2.15* 2.30*  CALCIUM 8.5* 8.1* 7.9* 8.0* 7.9*   GFR: Estimated Creatinine Clearance: 24.3 mL/min (A) (by C-G formula based on SCr of 2.3 mg/dL (H)). Liver Function Tests: Recent Labs  Lab 12/26/17 1218 12/28/17 0215 12/28/17 0749  AST 22 22 21   ALT 14* 13* 13*  ALKPHOS 100 93 87  BILITOT 0.8 0.6 0.6  PROT 6.1* 5.6* 5.6*  ALBUMIN 2.4* 2.1* 2.2*   No results for input(s): LIPASE, AMYLASE in the last 168 hours. No results for input(s): AMMONIA in the last 168 hours. Coagulation Profile: Recent Labs  Lab 12/28/17 0215 12/28/17 0749  INR 2.03 1.94   Cardiac Enzymes: No results for input(s): CKTOTAL, CKMB, CKMBINDEX, TROPONINI in the last 168 hours. BNP (last 3 results) No results for input(s): PROBNP in the last 8760 hours. HbA1C: No results for input(s): HGBA1C in the last 72 hours. CBG: Recent Labs  Lab 12/28/17 1955 12/28/17 2319 12/29/17 0431 12/29/17 0835 12/29/17 1154  GLUCAP 144* 127* 88 73 75   Lipid Profile: No results for input(s): CHOL, HDL, LDLCALC, TRIG, CHOLHDL, LDLDIRECT in the last 72 hours. Thyroid Function Tests: No results for input(s): TSH, T4TOTAL, FREET4, T3FREE, THYROIDAB in the last 72 hours. Anemia Panel: No results for input(s): VITAMINB12, FOLATE, FERRITIN, TIBC, IRON, RETICCTPCT in the last 72 hours. Urine analysis:    Component Value Date/Time   COLORURINE YELLOW 12/28/2017 0016   APPEARANCEUR CLOUDY (A) 12/28/2017 0016   LABSPEC 1.026 12/28/2017 0016   PHURINE 5.0 12/28/2017 0016   GLUCOSEU NEGATIVE 12/28/2017 0016   HGBUR NEGATIVE 12/28/2017 0016   BILIRUBINUR NEGATIVE 12/28/2017 0016    KETONESUR NEGATIVE 12/28/2017 0016   PROTEINUR NEGATIVE 12/28/2017 0016   NITRITE NEGATIVE 12/28/2017 0016   LEUKOCYTESUR NEGATIVE 12/28/2017 0016   Sepsis Labs: @LABRCNTIP (procalcitonin:4,lacticidven:4)  ) Recent Results (from the past 240 hour(s))  Culture, blood (routine x 2)     Status: None (Preliminary result)   Collection Time: 12/26/17 12:50 PM  Result Value Ref Range Status   Specimen Description LEFT ANTECUBITAL  Final   Special Requests   Final    BOTTLES DRAWN AEROBIC AND ANAEROBIC Blood Culture adequate volume   Culture   Final    NO GROWTH 3 DAYS Performed at [facility], [place] [address] [place], [place] [address]    Report Status PENDING  Incomplete  Culture, blood (routine x 2)     Status: None (Preliminary result)   Collection Time: 12/26/17  1:00 PM  Result Value Ref Range Status   Specimen Description BLOOD RIGHT ARM  Final  Special Requests   Final    BOTTLES DRAWN AEROBIC AND ANAEROBIC Blood Culture adequate volume   Culture   Final    NO GROWTH 3 DAYS Performed at [facility], [place] [address] [place], [place] [address]    Report Status PENDING  Incomplete  Aerobic Culture (superficial specimen)     Status: None   Collection Time: 12/27/17 10:41 AM  Result Value Ref Range Status   Specimen Description ABSCESS ABDOMEN LEFT LOWER  Final   Special Requests NONE  Final   Gram Stain   Final    FEW WBC PRESENT, PREDOMINANTLY PMN FEW GRAM POSITIVE COCCI IN PAIRS Performed at [facility], [place] [address] [place], [place] [address]    Culture MODERATE STAPHYLOCOCCUS AUREUS  Final   Report Status 12/29/2017 FINAL  Final   Organism ID, Bacteria STAPHYLOCOCCUS AUREUS  Final      Susceptibility   Staphylococcus aureus - MIC*    CIPROFLOXACIN <=0.5 SENSITIVE Sensitive     ERYTHROMYCIN <=0.25 SENSITIVE Sensitive     GENTAMICIN <=0.5 SENSITIVE Sensitive     OXACILLIN <=0.25 SENSITIVE Sensitive     TETRACYCLINE <=1 SENSITIVE Sensitive      VANCOMYCIN 1 SENSITIVE Sensitive     TRIMETH/SULFA <=10 SENSITIVE Sensitive     CLINDAMYCIN <=0.25 SENSITIVE Sensitive     RIFAMPIN <=0.5 SENSITIVE Sensitive     Inducible Clindamycin NEGATIVE Sensitive     * MODERATE STAPHYLOCOCCUS AUREUS  Surgical pcr screen     Status: Abnormal   Collection Time: 12/27/17  8:59 PM  Result Value Ref Range Status   MRSA, PCR NEGATIVE NEGATIVE Final   Staphylococcus aureus POSITIVE (A) NEGATIVE Final    Comment: (NOTE) The Xpert SA Assay (FDA approved for NASAL specimens in patients 54 years of age and older), is one component of a comprehensive surveillance program. It is not intended to diagnose infection nor to guide or monitor treatment. Performed at [facility], [place] [address] [place], [place] [address]       Studies: No results found.  Scheduled Meds: . amiodarone  200 mg Oral Daily  . bisoprolol  2.5 mg Oral Daily  . DULoxetine  30 mg Oral Daily  . insulin aspart  0-9 Units Subcutaneous Q4H  . insulin glargine  5 Units Subcutaneous QHS  . mouth rinse  15 mL Mouth Rinse BID  . pantoprazole  40 mg Oral Daily  . pravastatin  40 mg Oral q1800  . senna-docusate  1 tablet Oral BID    Continuous Infusions: . cefUROXime (ZINACEF)  IV    . piperacillin-tazobactam (ZOSYN)  IV 3.375 g (12/29/17 1319)     LOS: 3 days     Desiree Hane, MD [facility] Pager [phone]  If 7PM-7AM, please contact night-coverage [email or web address] Password [facility] 12/29/2017, 3:31 PM

## 2017-12-29 NOTE — Progress Notes (Signed)
Pray with a patient and son.  Conard Novak, Chaplain

## 2017-12-29 NOTE — Care Management Important Message (Signed)
Important Message  Patient Details  Name: Victor [name] MRN: [id number] Date of Birth: [birth year]   Medicare Important Message Given:  Yes    Orbie Pyo 12/29/2017, 12:39 PM

## 2017-12-29 NOTE — Transfer of Care (Signed)
Immediate Anesthesia Transfer of Care Note  Patient: Victor [name]  Procedure(s) Performed: IRRIGATION AND DEBRIDEMENT ABDOMINAL WALL ABSCESS (Left Abdomen) APPLICATION OF WOUND VAC (Left Abdomen)  Patient Location: PACU  Anesthesia Type:General  Level of Consciousness: awake, oriented and patient cooperative  Airway & Oxygen Therapy: Patient Spontanous Breathing and Patient connected to face mask oxygen  Post-op Assessment: Report given to RN and Post -op Vital signs reviewed and stable  Post vital signs: Reviewed and stable  Last Vitals:  Vitals:   12/29/17 0432 12/29/17 1839  BP: (!) 114/48   Pulse: (!) 56   Resp: 18   Temp: (!) 36.3 C 36.4 C  SpO2: 99%     Last Pain:  Vitals:   12/29/17 1839  TempSrc:   PainSc: Asleep      Patients Stated Pain Goal: 3 (15/94/58 5929)  Complications: No apparent anesthesia complications

## 2017-12-29 NOTE — Progress Notes (Signed)
Patient left for surgery around 1620 today. Grandson at bedside.

## 2017-12-29 NOTE — Brief Op Note (Signed)
12/29/2017  6:31 PM  PATIENT:  Victor [name]  82 y.o. male  PRE-OPERATIVE DIAGNOSIS:  ABDOMINAL WALL ABSCESS  POST-OPERATIVE DIAGNOSIS:  Abdominal wall abscess  PROCEDURE:  Procedure(s): IRRIGATION AND DEBRIDEMENT ABDOMINAL WALL ABSCESS (Left) APPLICATION OF WOUND VAC (Left)  SURGEON:  Surgeon(s) and Role:    Ivin Poot, MD - Primary  PHYSICIAN ASSISTANT: NONE  ASSISTANTS: RN  ANESTHESIA:   general  EBL:  10cc  BLOOD ADMINISTERED:none  DRAINS: none   LOCAL MEDICATIONS USED:  NONE  SPECIMEN:  No Specimen  DISPOSITION OF SPECIMEN:  N/A  COUNTS:  YES  TOURNIQUET:  * No tourniquets in log *  DICTATION: .Dragon Dictation  PLAN OF CARE: transfer to 4 E  PATIENT DISPOSITION:  PACU - hemodynamically stable.   Delay start of Pharmacological VTE agent (48hrs) due to surgical blood loss or risk of bleeding: yes

## 2017-12-29 NOTE — Progress Notes (Signed)
Pre Procedure note for inpatients:   Victor [name] has been scheduled for Procedure(s): IRRIGATION AND DEBRIDEMENT ABDOMINAL WALL ABSCESS (N/A) today. The various methods of treatment have been discussed with the patient. After consideration of the risks, benefits and treatment options the patient has consented to the planned procedure.   The patient has been seen and labs reviewed. There are no changes in the patient's condition to prevent proceeding with the planned procedure today.  Recent labs:  Lab Results  Component Value Date   WBC 6.8 12/29/2017   HGB 9.4 (L) 12/29/2017   HCT 30.7 (L) 12/29/2017   PLT 188 12/29/2017   GLUCOSE 94 12/29/2017   CHOL 127 06/29/2015   TRIG 111 06/29/2015   HDL 36 (L) 06/29/2015   LDLCALC 69 06/29/2015   ALT 13 (L) 12/28/2017   AST 21 12/28/2017   NA 139 12/29/2017   K 3.6 12/29/2017   CL 96 (L) 12/29/2017   CREATININE 1.97 (H) 12/29/2017   BUN 20 12/29/2017   CO2 28 12/29/2017   TSH 0.411 12/07/2017   INR 1.94 12/28/2017   HGBA1C 8.6 (H) 12/26/2017    Len Childs, MD 12/29/2017 5:24 PM

## 2017-12-29 NOTE — Anesthesia Preprocedure Evaluation (Signed)
Anesthesia Evaluation  Patient identified by MRN, date of birth, ID band Patient awake    Reviewed: Allergy & Precautions, NPO status , Patient's Chart, lab work & pertinent test results  Airway Mallampati: II       Dental   Pulmonary former smoker,    breath sounds clear to auscultation       Cardiovascular hypertension,  Rhythm:Irregular Rate:Normal     Neuro/Psych    GI/Hepatic   Endo/Other  diabetes  Renal/GU      Musculoskeletal   Abdominal   Peds  Hematology   Anesthesia Other Findings   Reproductive/Obstetrics                             Anesthesia Physical Anesthesia Plan  ASA: III  Anesthesia Plan: General   Post-op Pain Management:    Induction: Intravenous  PONV Risk Score and Plan: Ondansetron  Airway Management Planned: LMA  Additional Equipment:   Intra-op Plan:   Post-operative Plan:   Informed Consent: I have reviewed the patients History and Physical, chart, labs and discussed the procedure including the risks, benefits and alternatives for the proposed anesthesia with the patient or authorized representative who has indicated his/her understanding and acceptance.   Dental advisory given  Plan Discussed with: CRNA and Anesthesiologist  Anesthesia Plan Comments: (Infected hematoma over old abdominal wall AICD site Persistent AFib, on eliquis, held x 2 days with heparin bridge, heparin off 4 hours Ischemic cardiomyopathy EF 30-35% CAD S/P CABG 1990 Type 2 DM glucose 94 Renal insufficiency Cr. 1.97  Plan GA with LMA  Roberts Gaudy)        Anesthesia Quick Evaluation

## 2017-12-29 NOTE — Progress Notes (Signed)
Pharmacy Antibiotic Note  Victor [name] is a 82 y.o. male  On vanc  Tr -50 within goal  Plan: Continue 750 q12h  Height: 5\' 7"  (170.2 cm) Weight: 151 lb (68.5 kg) IBW/kg (Calculated) : 66.1  Temp (24hrs), Avg:97.7 F (36.5 C), Min:97.4 F (36.3 C), Max:97.9 F (36.6 C)  Recent Labs  Lab 12/26/17 1218 12/26/17 1232 12/26/17 1558 12/27/17 0521 12/28/17 0215 12/28/17 0749 12/28/17 1923 12/29/17 0707 12/29/17 1528 12/29/17 1554  WBC 10.8*  --   --  8.0 7.2 6.0  --  6.8  --   --   CREATININE 1.76*  --   --  1.47* 2.12* 2.15* 2.30*  --   --  1.97*  LATICACIDVEN  --  1.31 0.91  --   --   --   --   --   --   --   VANCOTROUGH  --   --   --   --   --   --   --   --  17  --     Estimated Creatinine Clearance: 28.4 mL/min (A) (by C-G formula based on SCr of 1.97 mg/dL (H)).    Allergies  Allergen Reactions  . Coreg [Carvedilol] Other (See Comments)    Shortness of breath ,fatigue ,dizzyness   . Lovastatin Other (See Comments)    CK elevation, Myalgias  . Ativan [Lorazepam] Other (See Comments)    Pt. had side effects   Levester Fresh, PharmD, BCPS, BCCCP Clinical Pharmacist Clinical phone for 12/29/2017 from [phone]: [phone] If after 2300, please call main pharmacy at: [phone] 12/29/2017 5:35 PM

## 2017-12-30 ENCOUNTER — Encounter (HOSPITAL_COMMUNITY): Payer: Self-pay | Admitting: Cardiothoracic Surgery

## 2017-12-30 LAB — BASIC METABOLIC PANEL
Anion gap: 14 (ref 5–15)
BUN: 22 mg/dL — ABNORMAL HIGH (ref 6–20)
CO2: 26 mmol/L (ref 22–32)
Calcium: 7.9 mg/dL — ABNORMAL LOW (ref 8.9–10.3)
Chloride: 96 mmol/L — ABNORMAL LOW (ref 101–111)
Creatinine, Ser: 2.01 mg/dL — ABNORMAL HIGH (ref 0.61–1.24)
GFR calc Af Amer: 35 mL/min — ABNORMAL LOW (ref >60)
GFR calc non Af Amer: 30 mL/min — ABNORMAL LOW (ref >60)
Glucose, Bld: 330 mg/dL — ABNORMAL HIGH (ref 65–99)
Potassium: 4.3 mmol/L (ref 3.5–5.1)
Sodium: 136 mmol/L (ref 135–145)

## 2017-12-30 LAB — APTT: aPTT: 46 seconds — ABNORMAL HIGH (ref 24–36)

## 2017-12-30 LAB — GLUCOSE, CAPILLARY
GLUCOSE-CAPILLARY: 216 mg/dL — AB (ref 65–99)
GLUCOSE-CAPILLARY: 425 mg/dL — AB (ref 65–99)
Glucose-Capillary: 155 mg/dL — ABNORMAL HIGH (ref 65–99)
Glucose-Capillary: 280 mg/dL — ABNORMAL HIGH (ref 65–99)
Glucose-Capillary: 290 mg/dL — ABNORMAL HIGH (ref 65–99)
Glucose-Capillary: 300 mg/dL — ABNORMAL HIGH (ref 65–99)

## 2017-12-30 LAB — CBC
HCT: 30.7 % — ABNORMAL LOW (ref 39.0–52.0)
Hemoglobin: 9.3 g/dL — ABNORMAL LOW (ref 13.0–17.0)
MCH: 25.3 pg — ABNORMAL LOW (ref 26.0–34.0)
MCHC: 30.3 g/dL (ref 30.0–36.0)
MCV: 83.4 fL (ref 78.0–100.0)
Platelets: 194 10*3/uL (ref 150–400)
RBC: 3.68 MIL/uL — ABNORMAL LOW (ref 4.22–5.81)
RDW: 14.8 % (ref 11.5–15.5)
WBC: 6.1 10*3/uL (ref 4.0–10.5)

## 2017-12-30 MED ORDER — MUSCLE RUB 10-15 % EX CREA
TOPICAL_CREAM | CUTANEOUS | Status: AC | PRN
  Administered 2017-12-30: via TOPICAL
  Administered 2018-01-05: 1 via TOPICAL
  Administered 2018-01-06: 18:00:00 via TOPICAL
  Filled 2017-12-30: qty 85

## 2017-12-30 NOTE — Anesthesia Postprocedure Evaluation (Signed)
Anesthesia Post Note  Patient: Victor [name]  Procedure(s) Performed: IRRIGATION AND DEBRIDEMENT ABDOMINAL WALL ABSCESS (Left Abdomen) APPLICATION OF WOUND VAC (Left Abdomen)     Patient location during evaluation: PACU Anesthesia Type: General Level of consciousness: awake Pain management: pain level controlled Vital Signs Assessment: post-procedure vital signs reviewed and stable Respiratory status: spontaneous breathing Cardiovascular status: stable Anesthetic complications: no    Last Vitals:  Vitals:   12/29/17 2011 12/29/17 2020  BP:  (!) 142/55  Pulse:    Resp:  19  Temp: (!) 36.4 C (!) 36.4 C  SpO2:  100%    Last Pain:  Vitals:   12/29/17 2020  TempSrc: Oral  PainSc: [place]

## 2017-12-30 NOTE — Progress Notes (Signed)
Attempted to fix blockage/seal on wound vac. Reinforce with ioban. Still showing blockage alarm. Can not get the continuous to go above 25. Did not want to change out sponge d/t being a fresh wound vac placement. Reinforce was only option. New sponge/kit in room for rounding team to trouble shoot and change if necessary. Will continue to monitor.

## 2017-12-30 NOTE — Progress Notes (Addendum)
      [address]       [place] [address]             [phone]        1 Day Post-Op Procedure(s) (LRB): IRRIGATION AND DEBRIDEMENT ABDOMINAL WALL ABSCESS (Left) APPLICATION OF WOUND VAC (Left)  Subjective: Patient did not get much sleep as nursing was trying to "fix wound VAC"  Objective: Vital signs in last 24 hours: Temp:  [97.5 F (36.4 C)-98.6 F (37 C)] 98.6 F (37 C) (02/09 0411) Pulse Rate:  [63-69] 68 (02/09 0411) Cardiac Rhythm: Heart block (02/08 2001) Resp:  [16-21] 21 (02/09 0411) BP: (119-167)/(48-78) 119/50 (02/09 0411) SpO2:  [83 %-100 %] 100 % (02/09 0411) Weight:  [151 lb (68.5 kg)-218 lb 7.6 oz (99.1 kg)] 218 lb 7.6 oz (99.1 kg) (02/08 2011)   Current Weight  12/29/17 218 lb 7.6 oz (99.1 kg)        Intake/Output from previous day: 02/08 0701 - 02/09 0700 In: 1013.3 [I.V.:613.3; IV Piggyback:400] Out: 250 [Urine:200; Drains:50]   Physical Exam:  Cardiovascular: RRR Pulmonary: Clear to auscultation bilaterally Abdomen: Soft, non tender, bowel sounds present. Wound VAC in place but not operating properly. Extremities: Trace bilateral lower extremity edema.   Lab Results: CBC: Recent Labs    12/29/17 0707 12/30/17 0151  WBC 6.8 6.1  HGB 9.4* 9.3*  HCT 30.7* 30.7*  PLT 188 194   BMET:  Recent Labs    12/29/17 1554 12/30/17 0151  NA 139 136  K 3.6 4.3  CL 96* 96*  CO2 28 26  GLUCOSE 94 330*  BUN 20 22*  CREATININE 1.97* 2.01*  CALCIUM 8.3* 7.9*    PT/INR:  Lab Results  Component Value Date   INR 1.94 12/28/2017   INR 2.03 12/28/2017   INR 1.26 12/06/2017   ABG:  INR: Will add last result for INR, ABG once components are confirmed Will add last 4 CBG results once components are confirmed  Assessment/Plan:  1. CV - First degree heart block, SR. On Bisoprolol 2.5 mg daily and Amiodarone 200 mg daily. 2.  Pulmonary - On 3 liters of oxygen via [place] (was on oxygen prior to admission) 3. ID-on Zosyn  for abdominal wall abscess 4.  Acute blood loss anemia - H and H 9.3 and 30.7 5. DM-CBGs 88/155/290. On Insulin. Per primary 6. Nurse changed wound VAC and now working properly. Bleeding likely clogged apparatus. As discussed with Dr. Nils Pyle, NO heparin until Monday 02/11.  Donielle M ZimmermanPA-C 12/30/2017,7:12 AM  Vac now functional I have seen and examined [name] and agree with the above assessment  and plan.  Grace Isaac MD Beeper [phone] Office [phone] 12/30/2017 2:57 PM

## 2017-12-30 NOTE — Progress Notes (Signed)
Patient refused CPAP and stated he doesn't wear it and will be okay with Nasal O2

## 2017-12-30 NOTE — Progress Notes (Signed)
PROGRESS NOTE  Victor [name] WJX:[id number] DOB: 1936/10/31 DOA: 12/26/2017 PCP: Cyndi Bender, PA-C  HPI/Recap of past 24 hours:  Victor [name] is a 82 y.o. year old male with medical history significant for atrial fibrillation jon eliquis, CAD s/p CABG ( 1990), chronic combined systolic/diastolic CHF s/p ICD, history of VT ( 2012), T2DM, CKD Stage3, HTN, HLD who presented on 12/26/2017 with subjective fevers, chills , and increased drainage, redness an pain of recently drained hematoma and was found to have left rectus sheath abdominal wall abscess.   Subjective Underwent irrigation and debridement of abdominal wall abscess with placement of wound VAC Last night. No acute complaints this morning.  Wound VAC nurse evaluating for proper seal.   Assessment/Plan: Principal Problem:   Abdominal wall abscess Active Problems:   Insulin dependent type 2 diabetes mellitus, controlled (HCC)   Essential hypertension   GERD (gastroesophageal reflux disease)   Hx of CABG 1990   OSA (obstructive sleep apnea)   Stage 3 chronic kidney disease (HCC)   AKI (acute kidney injury) ([place])   Atrial fibrillation, persistent (HCC)   COPD (chronic obstructive pulmonary disease) (HCC)  Left rectus sheath abdominal wall abscess status post I&D on 2/8 and wound VAC placement, stable Concern for a proper seal wound VAC nurse surgical team.  Will await their recommendations.  Continue to monitor.  Discontinue vancomycin the setting of AK I (additionally no history of MRSA) s/p incision and drainage of hematoma for pain relief on 1/21 and JP drain until 1/25.  We will continue empiric Zosyn and follow intraoperative cultures from I&D.  AKI on CKD Stage 3, improving Creatinine 2 down from peak of 2.3 ( up from baseline of 1.4-1.7).  Suspect related to vancomycin toxicity (Vanc trough on 2/8:17) continue to monitor 1 discontinue vancomycin.  Expect continued improvement.    Atrial Fibrillation, rate controlled New  onset from previous hospitalization on 11/2017. Continue bisoprolol and Amiodarone The previous plan was for DCCV in 3-4 weeks as an outpatient. Holding home eliquis, heparin per pharmacy protocol  Combined systolic/diastolic CHF, stable Normal volume status. Will continue to monitor  COPD on home oxygen 3L, stable No wheezing or respiratory distress.  Currently on 4 L likely transient increase in oxygen requirement after recent I&D  T2DM, controlled, A1c 8.6(12/2017) Lantus 5 units, monitor blood glucose, sliding scale  HLD, stable Continue home statin  GERD, stable Continue protonix  Code Status: DNR  Family Communication: NO family at bedside  Disposition Plan: fix seal on wound vac, determine antibiotic regimen pending intraoperative culture   Consultants:  CT surgery  Procedures: None  Antimicrobials:  IV vancomycin 2/5--2/8  IV Zosyn 2/5--    Cultures:  Superficial culture 12/27/17: MSSA(pan sensitive)  DVT prophylaxis: On home eliquis, currently holding since going to OR   Objective: Vitals:   12/29/17 1956 12/29/17 2011 12/29/17 2020 12/30/17 0411  BP:   (!) 142/55 (!) 119/50  Pulse:    68  Resp:   19 (!) 21  Temp: 98 F (36.7 C) (!) 97.5 F (36.4 C) (!) 97.5 F (36.4 C) 98.6 F (37 C)  TempSrc:  Oral Oral Oral  SpO2:   100% 100%  Weight:  99.1 kg (218 lb 7.6 oz)    Height:  5\' 7"  (1.702 m)      Intake/Output Summary (Last 24 hours) at 12/30/2017 1150 Last data filed at 12/30/2017 0403 Gross per 24 hour  Intake 1013.33 ml  Output 50 ml  Net 963.33 ml  Filed Weights   12/26/17 1206 12/29/17 1637 12/29/17 2011  Weight: 68.5 kg (151 lb) 68.5 kg (151 lb) 99.1 kg (218 lb 7.6 oz)    Exam:  Gen. Alert, lying in bed, no distress Resp. 4 L , no wheezing or crackles, no distress CV. Irregularly irregular rhythm, normal rate, SEM 3/6 loudest in LUSB GI. Soft abdomen, wound vac in place on llq of abdomen but no drainage in cannister    Extremities. Trace pitting edema bilaterally  Data Reviewed: CBC: Recent Labs  Lab 12/26/17 1218 12/27/17 0521 12/28/17 0215 12/28/17 0749 12/29/17 0707 12/30/17 0151  WBC 10.8* 8.0 7.2 6.0 6.8 6.1  NEUTROABS 8.9*  --   --   --  4.7  --   HGB 9.0* 8.2* 8.3* 8.9* 9.4* 9.3*  HCT 29.6* 26.2* 27.1* 27.2* 30.7* 30.7*  MCV 82.9 83.4 82.6 82.9 82.5 83.4  PLT 181 155 148* 142* 188 941   Basic Metabolic Panel: Recent Labs  Lab 12/28/17 0215 12/28/17 0749 12/28/17 1923 12/29/17 1554 12/30/17 0151  NA 137 136 137 139 136  K 3.1* 3.2* 3.8 3.6 4.3  CL 91* 92* 93* 96* 96*  CO2 33* 31 31 28 26   GLUCOSE 98 113* 159* 94 330*  BUN 22* 23* 23* 20 22*  CREATININE 2.12* 2.15* 2.30* 1.97* 2.01*  CALCIUM 7.9* 8.0* 7.9* 8.3* 7.9*   GFR: Estimated Creatinine Clearance: 33.4 mL/min (A) (by C-G formula based on SCr of 2.01 mg/dL (H)). Liver Function Tests: Recent Labs  Lab 12/26/17 1218 12/28/17 0215 12/28/17 0749  AST 22 22 21   ALT 14* 13* 13*  ALKPHOS 100 93 87  BILITOT 0.8 0.6 0.6  PROT 6.1* 5.6* 5.6*  ALBUMIN 2.4* 2.1* 2.2*   No results for input(s): LIPASE, AMYLASE in the last 168 hours. No results for input(s): AMMONIA in the last 168 hours. Coagulation Profile: Recent Labs  Lab 12/28/17 0215 12/28/17 0749  INR 2.03 1.94   Cardiac Enzymes: No results for input(s): CKTOTAL, CKMB, CKMBINDEX, TROPONINI in the last 168 hours. BNP (last 3 results) No results for input(s): PROBNP in the last 8760 hours. HbA1C: No results for input(s): HGBA1C in the last 72 hours. CBG: Recent Labs  Lab 12/29/17 1154 12/29/17 1548 12/29/17 1839 12/30/17 0015 12/30/17 0414  GLUCAP 75 81 88 155* 290*   Lipid Profile: No results for input(s): CHOL, HDL, LDLCALC, TRIG, CHOLHDL, LDLDIRECT in the last 72 hours. Thyroid Function Tests: No results for input(s): TSH, T4TOTAL, FREET4, T3FREE, THYROIDAB in the last 72 hours. Anemia Panel: No results for input(s): VITAMINB12, FOLATE,  FERRITIN, TIBC, IRON, RETICCTPCT in the last 72 hours. Urine analysis:    Component Value Date/Time   COLORURINE YELLOW 12/28/2017 0016   APPEARANCEUR CLOUDY (A) 12/28/2017 0016   LABSPEC 1.026 12/28/2017 0016   PHURINE 5.0 12/28/2017 0016   GLUCOSEU NEGATIVE 12/28/2017 0016   HGBUR NEGATIVE 12/28/2017 0016   BILIRUBINUR NEGATIVE 12/28/2017 0016   KETONESUR NEGATIVE 12/28/2017 0016   PROTEINUR NEGATIVE 12/28/2017 0016   NITRITE NEGATIVE 12/28/2017 0016   LEUKOCYTESUR NEGATIVE 12/28/2017 0016   Sepsis Labs: @LABRCNTIP (procalcitonin:4,lacticidven:4)  ) Recent Results (from the past 240 hour(s))  Culture, blood (routine x 2)     Status: None (Preliminary result)   Collection Time: 12/26/17 12:50 PM  Result Value Ref Range Status   Specimen Description LEFT ANTECUBITAL  Final   Special Requests   Final    BOTTLES DRAWN AEROBIC AND ANAEROBIC Blood Culture adequate volume   Culture   Final  NO GROWTH 4 DAYS Performed at [facility], [place] [address] [place], [place] [address]    Report Status PENDING  Incomplete  Culture, blood (routine x 2)     Status: None (Preliminary result)   Collection Time: 12/26/17  1:00 PM  Result Value Ref Range Status   Specimen Description BLOOD RIGHT ARM  Final   Special Requests   Final    BOTTLES DRAWN AEROBIC AND ANAEROBIC Blood Culture adequate volume   Culture   Final    NO GROWTH 4 DAYS Performed at [facility], [place] [address] [place], [place] [address]    Report Status PENDING  Incomplete  Aerobic Culture (superficial specimen)     Status: None   Collection Time: 12/27/17 10:41 AM  Result Value Ref Range Status   Specimen Description ABSCESS ABDOMEN LEFT LOWER  Final   Special Requests NONE  Final   Gram Stain   Final    FEW WBC PRESENT, PREDOMINANTLY PMN FEW GRAM POSITIVE COCCI IN PAIRS Performed at [facility], [place] [address], [place], [place] [address]    Culture MODERATE STAPHYLOCOCCUS AUREUS  Final    Report Status 12/29/2017 FINAL  Final   Organism ID, Bacteria STAPHYLOCOCCUS AUREUS  Final      Susceptibility   Staphylococcus aureus - MIC*    CIPROFLOXACIN <=0.5 SENSITIVE Sensitive     ERYTHROMYCIN <=0.25 SENSITIVE Sensitive     GENTAMICIN <=0.5 SENSITIVE Sensitive     OXACILLIN <=0.25 SENSITIVE Sensitive     TETRACYCLINE <=1 SENSITIVE Sensitive     VANCOMYCIN 1 SENSITIVE Sensitive     TRIMETH/SULFA <=10 SENSITIVE Sensitive     CLINDAMYCIN <=0.25 SENSITIVE Sensitive     RIFAMPIN <=0.5 SENSITIVE Sensitive     Inducible Clindamycin NEGATIVE Sensitive     * MODERATE STAPHYLOCOCCUS AUREUS  Surgical pcr screen     Status: Abnormal   Collection Time: 12/27/17  8:59 PM  Result Value Ref Range Status   MRSA, PCR NEGATIVE NEGATIVE Final   Staphylococcus aureus POSITIVE (A) NEGATIVE Final    Comment: (NOTE) The Xpert SA Assay (FDA approved for NASAL specimens in patients 11 years of age and older), is one component of a comprehensive surveillance program. It is not intended to diagnose infection nor to guide or monitor treatment. Performed at [facility], [place] [address], [place], [place] [address]   Aerobic/Anaerobic Culture (surgical/deep wound)     Status: None (Preliminary result)   Collection Time: 12/29/17  6:11 PM  Result Value Ref Range Status   Specimen Description FLUID LEFT LOWER ABDOMEN  Final   Special Requests PATIENT ON FOLLOWING VANC AND ZOSYN  Final   Gram Stain   Final    MODERATE WBC PRESENT, PREDOMINANTLY PMN NO ORGANISMS SEEN    Culture   Final    CULTURE REINCUBATED FOR BETTER GROWTH Performed at [facility], [place] [address], [place],  [address]    Report Status PENDING  Incomplete      Studies: No results found.  Scheduled Meds: . amiodarone  200 mg Oral Daily  . bisoprolol  2.5 mg Oral Daily  . DULoxetine  30 mg Oral Daily  . insulin aspart  0-9 Units Subcutaneous Q4H  . insulin glargine  5 Units Subcutaneous QHS  . mouth  rinse  15 mL Mouth Rinse BID  . pantoprazole  40 mg Oral Daily  . pravastatin  40 mg Oral q1800  . senna-docusate  1  tablet Oral BID    Continuous Infusions: . sodium chloride 10 mL/hr at 12/29/17 1643  . piperacillin-tazobactam (ZOSYN)  IV 3.375 g (12/30/17 0640)  . vancomycin Stopped (12/30/17 0018)     LOS: 4 days     Desiree Hane, MD [facility] Pager [phone]  If 7PM-7AM, please contact night-coverage [email or web address] Password [email or web address] 12/30/2017, 11:50 AM

## 2017-12-30 NOTE — Progress Notes (Signed)
Wound VAC showing blockage, Canister changed, Dressing changed under sterile conditions.

## 2017-12-31 LAB — CBC
HCT: 27.4 % — ABNORMAL LOW (ref 39.0–52.0)
Hemoglobin: 8.4 g/dL — ABNORMAL LOW (ref 13.0–17.0)
MCH: 25.3 pg — ABNORMAL LOW (ref 26.0–34.0)
MCHC: 30.7 g/dL (ref 30.0–36.0)
MCV: 82.5 fL (ref 78.0–100.0)
Platelets: 208 10*3/uL (ref 150–400)
RBC: 3.32 MIL/uL — ABNORMAL LOW (ref 4.22–5.81)
RDW: 14.7 % (ref 11.5–15.5)
WBC: 8.3 10*3/uL (ref 4.0–10.5)

## 2017-12-31 LAB — GLUCOSE, CAPILLARY
GLUCOSE-CAPILLARY: 149 mg/dL — AB (ref 65–99)
GLUCOSE-CAPILLARY: 137 mg/dL — AB (ref 65–99)
GLUCOSE-CAPILLARY: 252 mg/dL — AB (ref 65–99)
Glucose-Capillary: 218 mg/dL — ABNORMAL HIGH (ref 65–99)
Glucose-Capillary: 249 mg/dL — ABNORMAL HIGH (ref 65–99)

## 2017-12-31 LAB — CULTURE, BLOOD (ROUTINE X 2)
CULTURE: NO GROWTH
Culture: NO GROWTH
SPECIAL REQUESTS: ADEQUATE
Special Requests: ADEQUATE

## 2017-12-31 LAB — BASIC METABOLIC PANEL
Anion gap: 10 (ref 5–15)
BUN: 28 mg/dL — ABNORMAL HIGH (ref 6–20)
CHLORIDE: 97 mmol/L — AB (ref 101–111)
CO2: 31 mmol/L (ref 22–32)
Calcium: 8.1 mg/dL — ABNORMAL LOW (ref 8.9–10.3)
Creatinine, Ser: 1.64 mg/dL — ABNORMAL HIGH (ref 0.61–1.24)
GFR calc non Af Amer: 38 mL/min — ABNORMAL LOW (ref >60)
GFR, EST AFRICAN AMERICAN: 44 mL/min — AB (ref >60)
Glucose, Bld: 177 mg/dL — ABNORMAL HIGH (ref 65–99)
POTASSIUM: 4.2 mmol/L (ref 3.5–5.1)
SODIUM: 138 mmol/L (ref 135–145)

## 2017-12-31 MED ORDER — INSULIN ASPART 100 UNIT/ML ~~LOC~~ SOLN
0.0000 [IU] | Freq: Every day | SUBCUTANEOUS | Status: DC
Start: 2017-12-31 — End: 2018-01-10
  Administered 2017-12-31 – 2018-01-07 (×5): 2 [IU] via SUBCUTANEOUS

## 2017-12-31 MED ORDER — CEFAZOLIN SODIUM-DEXTROSE 2-4 GM/100ML-% IV SOLN
2.0000 g | Freq: Three times a day (TID) | INTRAVENOUS | Status: DC
  Administered 2017-12-31 – 2018-01-10 (×31): 2 g via INTRAVENOUS
  Filled 2017-12-31 (×33): qty 100

## 2017-12-31 MED ORDER — INSULIN ASPART 100 UNIT/ML ~~LOC~~ SOLN
0.0000 [IU] | Freq: Three times a day (TID) | SUBCUTANEOUS | Status: DC
  Administered 2018-01-01 – 2018-01-02 (×5): 2 [IU] via SUBCUTANEOUS
  Administered 2018-01-02 – 2018-01-04 (×3): 1 [IU] via SUBCUTANEOUS
  Administered 2018-01-04: 2 [IU] via SUBCUTANEOUS
  Administered 2018-01-04: 1 [IU] via SUBCUTANEOUS
  Administered 2018-01-05: 3 [IU] via SUBCUTANEOUS
  Administered 2018-01-05 – 2018-01-06 (×3): 2 [IU] via SUBCUTANEOUS
  Administered 2018-01-06 – 2018-01-07 (×2): 1 [IU] via SUBCUTANEOUS
  Administered 2018-01-08: 3 [IU] via SUBCUTANEOUS
  Administered 2018-01-08: 5 [IU] via SUBCUTANEOUS
  Administered 2018-01-09: 2 [IU] via SUBCUTANEOUS
  Administered 2018-01-09: 1 [IU] via SUBCUTANEOUS
  Administered 2018-01-09 – 2018-01-10 (×3): 2 [IU] via SUBCUTANEOUS

## 2017-12-31 MED ORDER — HEPARIN (PORCINE) IN NACL 100-0.45 UNIT/ML-% IJ SOLN
1200.0000 [IU]/h | INTRAMUSCULAR | Status: DC
  Administered 2018-01-01: 800 [IU]/h via INTRAVENOUS
  Administered 2018-01-02 – 2018-01-04 (×3): 1050 [IU]/h via INTRAVENOUS
  Administered 2018-01-05: 1150 [IU]/h via INTRAVENOUS
  Administered 2018-01-06: 1200 [IU]/h via INTRAVENOUS
  Filled 2017-12-31 (×6): qty 250

## 2017-12-31 NOTE — Progress Notes (Signed)
PROGRESS NOTE  Victor [name] HQP:[id number] DOB: Feb 19, 1936 DOA: 12/26/2017 PCP: Cyndi Bender, PA-C  HPI/Recap of past 24 hours:  Victor [name] is a 82 y.o. year old male with medical history significant for atrial fibrillation jon eliquis, CAD s/p CABG ( 1990), chronic combined systolic/diastolic CHF s/p ICD, history of VT ( 2012), T2DM, CKD Stage3, HTN, HLD who presented on 12/26/2017 with subjective fevers, chills , and increased drainage, redness an pain of recently drained hematoma and was found to have left rectus sheath abdominal wall abscess.   Subjective Doing well this morning. No problems with breathing.    Assessment/Plan: Principal Problem:   Abdominal wall abscess Active Problems:   Insulin dependent type 2 diabetes mellitus, controlled (HCC)   Essential hypertension   GERD (gastroesophageal reflux disease)   Hx of CABG 1990   OSA (obstructive sleep apnea)   Stage 3 chronic kidney disease (HCC)   AKI (acute kidney injury) ([place])   Atrial fibrillation, persistent (HCC)   COPD (chronic obstructive pulmonary disease) (HCC)  Left rectus sheath abdominal wall abscess status post I&D on 2/8 and wound VAC placement, stable Remains stable, no signs of Sepsis. Previous cultures showed MSSA, intraoperative culture shows rare staph aureus, Awaiting sensitivities. Wound vac in place and functional.  Continue to monitor.  Vanc omycin was discontinued the setting of AK I (additionally no history of MRSA)  We will continue empiric Zosyn and follow intraoperative cultures from I&D.  AKI on CKD Stage 3, improving Creatinine 1.6 down from peak of 2.3 ( up from baseline of 1.4-1.7).  Suspect related to vancomycin toxicity (Vanc trough on 2/8:17) continue to monitor off vancomycin.  Expect continued improvement.    Atrial Fibrillation, rate controlled New onset from previous hospitalization on 11/2017. Continue bisoprolol and Amiodarone The previous plan was for DCCV in 3-4 weeks as an  outpatient. Holding home eliquis, Surgery instructs to hold heparin given concern for postoperative bleed until 2/11. We will monitor  Combined systolic/diastolic CHF, stable Some crackles at base of lungs, trace pitting edema. Currently on 4 L [place](up from home requirement of 3L) I suspect he will be able to come down back to 3 as his SpO2 has remained greater than 88% on 4L.  Only minimal edema in legs and trace crackles of lungs so will hold off on any diuresis given he has no subjective complaints. Continue to monitor   COPD on home oxygen 3L, stable No wheezing or respiratory distress.  Currently on 4 L likely transient increase in oxygen requirement after recent I&D, wean oxygen , goal SpO2 > 88%  T2DM, controlled, A1c 8.6(12/2017) Range 130-210 in last 24 hours Lantus 5 units, monitor blood glucose, sliding scale  HLD, stable Continue home statin  GERD, stable Continue protonix  Code Status: DNR  Family Communication: No family at bedside  Disposition Plan: determine antibiotic regimen pending intraoperative culture, holding heparin until 2/11   Consultants:  CT surgery  Procedures: None  Antimicrobials:  IV vancomycin 2/5--2/8  IV Zosyn 2/5--    Cultures:  Superficial culture 12/27/17: MSSA(pan sensitive)  DVT prophylaxis: On home eliquis, currently holding heparin gtt until 2/11 given recent procedure  Objective: Vitals:   12/30/17 0411 12/30/17 1355 12/30/17 1942 12/31/17 0351  BP: (!) 119/50 (!) 132/55 (!) 146/61 (!) 156/68  Pulse: 68 79 82 78  Resp: (!) 21 18 18 20   Temp: 98.6 F (37 C) 98 F (36.7 C) 97.6 F (36.4 C) 97.6 F (36.4 C)  TempSrc: Oral  Oral Oral Oral  SpO2: 100% 99% 95% 100%  Weight:      Height:        Intake/Output Summary (Last 24 hours) at 12/31/2017 0932 Last data filed at 12/31/2017 0810 Gross per 24 hour  Intake 859.5 ml  Output 1580 ml  Net -720.5 ml   Filed Weights   12/26/17 1206 12/29/17 1637 12/29/17 2011    Weight: 68.5 kg (151 lb) 68.5 kg (151 lb) 99.1 kg (218 lb 7.6 oz)    Exam:  Gen. Alert, lying in bed, no distress Resp. 4 L [place], minimal crackles at bases, no wheezing , no distress CV. Irregularly irregular rhythm, normal rate, SEM 3/6 loudest in LUSB GI. Soft abdomen, wound vac in place, draining appropriately in cannister  Extremities. Trace pitting edema bilaterally  Data Reviewed: CBC: Recent Labs  Lab 12/26/17 1218  12/28/17 0215 12/28/17 0749 12/29/17 0707 12/30/17 0151 12/31/17 0333  WBC 10.8*   < > 7.2 6.0 6.8 6.1 8.3  NEUTROABS 8.9*  --   --   --  4.7  --   --   HGB 9.0*   < > 8.3* 8.9* 9.4* 9.3* 8.4*  HCT 29.6*   < > 27.1* 27.2* 30.7* 30.7* 27.4*  MCV 82.9   < > 82.6 82.9 82.5 83.4 82.5  PLT 181   < > 148* 142* 188 194 208   < > = values in this interval not displayed.   Basic Metabolic Panel: Recent Labs  Lab 12/28/17 0749 12/28/17 1923 12/29/17 1554 12/30/17 0151 12/31/17 0333  NA 136 137 139 136 138  K 3.2* 3.8 3.6 4.3 4.2  CL 92* 93* 96* 96* 97*  CO2 31 31 28 26 31   GLUCOSE 113* 159* 94 330* 177*  BUN 23* 23* 20 22* 28*  CREATININE 2.15* 2.30* 1.97* 2.01* 1.64*  CALCIUM 8.0* 7.9* 8.3* 7.9* 8.1*   GFR: Estimated Creatinine Clearance: 41 mL/min (A) (by C-G formula based on SCr of 1.64 mg/dL (H)). Liver Function Tests: Recent Labs  Lab 12/26/17 1218 12/28/17 0215 12/28/17 0749  AST 22 22 21   ALT 14* 13* 13*  ALKPHOS 100 93 87  BILITOT 0.8 0.6 0.6  PROT 6.1* 5.6* 5.6*  ALBUMIN 2.4* 2.1* 2.2*   No results for input(s): LIPASE, AMYLASE in the last 168 hours. No results for input(s): AMMONIA in the last 168 hours. Coagulation Profile: Recent Labs  Lab 12/28/17 0215 12/28/17 0749  INR 2.03 1.94   Cardiac Enzymes: No results for input(s): CKTOTAL, CKMB, CKMBINDEX, TROPONINI in the last 168 hours. BNP (last 3 results) No results for input(s): PROBNP in the last 8760 hours. HbA1C: No results for input(s): HGBA1C in the last 72  hours. CBG: Recent Labs  Lab 12/30/17 1632 12/30/17 2005 12/30/17 2336 12/31/17 0348 12/31/17 0808  GLUCAP 300* 280* 216* 149* 137*   Lipid Profile: No results for input(s): CHOL, HDL, LDLCALC, TRIG, CHOLHDL, LDLDIRECT in the last 72 hours. Thyroid Function Tests: No results for input(s): TSH, T4TOTAL, FREET4, T3FREE, THYROIDAB in the last 72 hours. Anemia Panel: No results for input(s): VITAMINB12, FOLATE, FERRITIN, TIBC, IRON, RETICCTPCT in the last 72 hours. Urine analysis:    Component Value Date/Time   COLORURINE YELLOW 12/28/2017 0016   APPEARANCEUR CLOUDY (A) 12/28/2017 0016   LABSPEC 1.026 12/28/2017 0016   PHURINE 5.0 12/28/2017 0016   GLUCOSEU NEGATIVE 12/28/2017 0016   HGBUR NEGATIVE 12/28/2017 0016   BILIRUBINUR NEGATIVE 12/28/2017 0016   KETONESUR NEGATIVE 12/28/2017 0016   PROTEINUR  NEGATIVE 12/28/2017 0016   NITRITE NEGATIVE 12/28/2017 0016   LEUKOCYTESUR NEGATIVE 12/28/2017 0016   Sepsis Labs: @LABRCNTIP (procalcitonin:4,lacticidven:4)  ) Recent Results (from the past 240 hour(s))  Culture, blood (routine x 2)     Status: None   Collection Time: 12/26/17 12:50 PM  Result Value Ref Range Status   Specimen Description LEFT ANTECUBITAL  Final   Special Requests   Final    BOTTLES DRAWN AEROBIC AND ANAEROBIC Blood Culture adequate volume   Culture   Final    NO GROWTH 5 DAYS Performed at [facility], [place] [address] [place], [place] [address]    Report Status 12/31/2017 FINAL  Final  Culture, blood (routine x 2)     Status: None   Collection Time: 12/26/17  1:00 PM  Result Value Ref Range Status   Specimen Description BLOOD RIGHT ARM  Final   Special Requests   Final    BOTTLES DRAWN AEROBIC AND ANAEROBIC Blood Culture adequate volume   Culture   Final    NO GROWTH 5 DAYS Performed at [facility], [place] [address], [place], [place] [address]    Report Status 12/31/2017 FINAL  Final  Aerobic Culture (superficial specimen)     Status:  None   Collection Time: 12/27/17 10:41 AM  Result Value Ref Range Status   Specimen Description ABSCESS ABDOMEN LEFT LOWER  Final   Special Requests NONE  Final   Gram Stain   Final    FEW WBC PRESENT, PREDOMINANTLY PMN FEW GRAM POSITIVE COCCI IN PAIRS Performed at [facility], [place] [address] [place], [place] [address]    Culture MODERATE STAPHYLOCOCCUS AUREUS  Final   Report Status 12/29/2017 FINAL  Final   Organism ID, Bacteria STAPHYLOCOCCUS AUREUS  Final      Susceptibility   Staphylococcus aureus - MIC*    CIPROFLOXACIN <=0.5 SENSITIVE Sensitive     ERYTHROMYCIN <=0.25 SENSITIVE Sensitive     GENTAMICIN <=0.5 SENSITIVE Sensitive     OXACILLIN <=0.25 SENSITIVE Sensitive     TETRACYCLINE <=1 SENSITIVE Sensitive     VANCOMYCIN 1 SENSITIVE Sensitive     TRIMETH/SULFA <=10 SENSITIVE Sensitive     CLINDAMYCIN <=0.25 SENSITIVE Sensitive     RIFAMPIN <=0.5 SENSITIVE Sensitive     Inducible Clindamycin NEGATIVE Sensitive     * MODERATE STAPHYLOCOCCUS AUREUS  Surgical pcr screen     Status: Abnormal   Collection Time: 12/27/17  8:59 PM  Result Value Ref Range Status   MRSA, PCR NEGATIVE NEGATIVE Final   Staphylococcus aureus POSITIVE (A) NEGATIVE Final    Comment: (NOTE) The Xpert SA Assay (FDA approved for NASAL specimens in patients 20 years of age and older), is one component of a comprehensive surveillance program. It is not intended to diagnose infection nor to guide or monitor treatment. Performed at [facility], [place] [address] [place], [place] [address]   Aerobic/Anaerobic Culture (surgical/deep wound)     Status: None (Preliminary result)   Collection Time: 12/29/17  6:11 PM  Result Value Ref Range Status   Specimen Description FLUID LEFT LOWER ABDOMEN  Final   Special Requests PATIENT ON FOLLOWING VANC AND ZOSYN  Final   Gram Stain   Final    MODERATE WBC PRESENT, PREDOMINANTLY PMN NO ORGANISMS SEEN Performed at [facility], [place] [address], [place], [place] [address]    Culture RARE STAPHYLOCOCCUS AUREUS  Final   Report Status PENDING  Incomplete  Studies: No results found.  Scheduled Meds: . amiodarone  200 mg Oral Daily  . bisoprolol  2.5 mg Oral Daily  . DULoxetine  30 mg Oral Daily  . insulin aspart  0-9 Units Subcutaneous Q4H  . insulin glargine  5 Units Subcutaneous QHS  . mouth rinse  15 mL Mouth Rinse BID  . pantoprazole  40 mg Oral Daily  . pravastatin  40 mg Oral q1800  . senna-docusate  1 tablet Oral BID    Continuous Infusions: . sodium chloride 10 mL/hr at 12/31/17 0600  . piperacillin-tazobactam (ZOSYN)  IV Stopped (12/31/17 0127)     LOS: 5 days     Desiree Hane, MD [facility] Pager [phone]  If 7PM-7AM, please contact night-coverage [email or web address] Password [facility] 12/31/2017, 9:32 AM

## 2017-12-31 NOTE — Progress Notes (Signed)
ANTICOAGULATION + ANTIBIOTIC CONSULT NOTE - Follow Up Consult  Pharmacy Consult for Heparin to resume 2/11 at 7am:  Zosyn->Cefazolin Indication: atrial fibrillation; abdominal wall abscess  Allergies  Allergen Reactions  . Coreg [Carvedilol] Other (See Comments)    Shortness of breath ,fatigue ,dizzyness   . Lovastatin Other (See Comments)    CK elevation, Myalgias  . Ativan [Lorazepam] Other (See Comments)    Pt. had side effects    Patient Measurements: Height: 5\' 7"  (170.2 cm) Weight: 218 lb 7.6 oz (99.1 kg) IBW/kg (Calculated) : 66.1  Prior recorded weight 151 lbs (68.5 kg) on 2/8 is more consistent with recent weights Heparin Dosing Weight: 68 kg  Vital Signs: Temp: 97.6 F (36.4 C) (02/10 0351) Temp Source: Oral (02/10 0351) BP: 156/68 (02/10 0351) Pulse Rate: 78 (02/10 0351)  Labs: Recent Labs    12/28/17 1326  12/29/17 0707 12/29/17 1554 12/30/17 0151 12/31/17 0333  HGB  --    < > 9.4*  --  9.3* 8.4*  HCT  --   --  30.7*  --  30.7* 27.4*  PLT  --   --  188  --  194 208  APTT 84*  --  48*  --  46*  --   HEPARINUNFRC  --   --  >2.20*  --   --   --   CREATININE  --    < >  --  1.97* 2.01* 1.64*   < > = values in this interval not displayed.    Estimated Creatinine Clearance: 41 mL/min (A) (by C-G formula based on SCr of 1.64 mg/dL (H)).  Assessment:  58 YOM with recently diagnosis AFib who was started on Eliquis in January. Eliquis held for I&D for abdominal wall abscess on 12/29/17.  Last Eliquis dose 2/4 prior to admission.    Patient was on IV heparin pre-op 2/6>>2/7. Stopped at ~midnight on 2/7 for OR on 2/8 am, and to resume at 7am on 2/11.   Hgb 9.3>8.4.  Patient reports bleeding this am from R outer elbow IV site; site changed to right hand, bleeding stopped.  RN reports bloody VAC drainage but not excessive.      Day # 4 antibiotics.  Vanc and Zosyn initially; Vanc stopped on 2/8 with incr in Scr. Scr trending down.  Abdominal fluid and abscess cultures  grew Staph aureus, methicillin-sensitive.  Spoke with Dr. Lonny Prude.  To change Zosyn to Cefazolin to provider more narrow and better Staph coverage.  Afebrile.   Cefazolin 2/10>> Zosyn 2/5>>2/10 Vanc 2/5>>2/9 Cefuroxime x 1 on 2/8 pre-op  2/8: Vanc trough = 17 mcg/ml on 750 mg IV q24hrs - at goal  2/8 fluid left lower abdomen - few MSSA 2/5 Blood x 2 - negative 2/6 abscess left lower abdomen -  moderate MSSA 2/6 MRSA PCR - positive for Staph, negative for MRSA  Goal of Therapy:  Heparin level 0.3-0.7 units/ml Monitor platelets by anticoagulation protocol: Yes  Appropriate Cefazolin dose for renal function and infection   Plan:   Resume heparin drip at 7am on 2/11 per Dr. Lucianne Lei Trigt's prior orders.  Heparin to resume without bolus at 800 units/hr  Heparin level ~6 hours after drip resumes  Daily heparin level and CBC while on heparin.  Monitor wound VAC output and for any s/sx bleeding.  Eliquis on hold.  Change Zosyn to Cefazolin 2gm IV q8hrs.  Follow renal function, progress and antibiotic plans.  Arty Baumgartner, [place] Pager: [phone] 12/31/2017,12:06 PM

## 2017-12-31 NOTE — Progress Notes (Signed)
Patient sister in to visit. She is concerned about taking him home upon discharge. Family is unable to provide care and the living situation is complicated by family dynamics. Patient sister Lynelle Smoke is suggesting ' [facility]' upon DC.

## 2017-12-31 NOTE — Progress Notes (Addendum)
      [address]       ,[place] [address]             [phone]        2 Days Post-Op Procedure(s) (LRB): IRRIGATION AND DEBRIDEMENT ABDOMINAL WALL ABSCESS (Left) APPLICATION OF WOUND VAC (Left)  Subjective: Patient asleep. He was cold and asked me to turn up room temperature.  Objective: Vital signs in last 24 hours: Temp:  [97.6 F (36.4 C)-98 F (36.7 C)] 97.6 F (36.4 C) (02/10 0351) Pulse Rate:  [78-82] 78 (02/10 0351) Cardiac Rhythm: Normal sinus rhythm;Heart block (02/10 0700) Resp:  [18-20] 20 (02/10 0351) BP: (132-156)/(55-68) 156/68 (02/10 0351) SpO2:  [95 %-100 %] 100 % (02/10 0351)   Current Weight  12/29/17 218 lb 7.6 oz (99.1 kg)      Intake/Output from previous day: 02/09 0701 - 02/10 0700 In: 1099.5 [P.O.:840; I.V.:259.5] Out: 1280 [Urine:1230; Drains:50]   Physical Exam:  Cardiovascular: Tachycardic Pulmonary: Clear to auscultation bilaterally Abdomen: Soft, non tender, bowel sounds present. Wound VAC in place. Extremities: Trace bilateral lower extremity edema.   Lab Results: CBC: Recent Labs    12/30/17 0151 12/31/17 0333  WBC 6.1 8.3  HGB 9.3* 8.4*  HCT 30.7* 27.4*  PLT 194 208   BMET:  Recent Labs    12/30/17 0151 12/31/17 0333  NA 136 138  K 4.3 4.2  CL 96* 97*  CO2 26 31  GLUCOSE 330* 177*  BUN 22* 28*  CREATININE 2.01* 1.64*  CALCIUM 7.9* 8.1*    PT/INR:  Lab Results  Component Value Date   INR 1.94 12/28/2017   INR 2.03 12/28/2017   INR 1.26 12/06/2017   ABG:  INR: Will add last result for INR, ABG once components are confirmed Will add last 4 CBG results once components are confirmed  Assessment/Plan:  1. CV - First degree heart block, SR. On Bisoprolol 2.5 mg daily and Amiodarone 200 mg daily. 2.  Pulmonary - On 3 liters of oxygen via [place] (was on oxygen prior to admission) 3. ID-on Zosyn for abdominal wall abscess. Culture from 02/06 showed Staph Aureus and culture from OR showed  rare Staph Aureus. 4.  Acute blood loss anemia - H and H 9.3 and 30.7 5. DM-CBGs 280/216/149. On Insulin. Per primary 6.  Per Dr. Prescott Gum, NO heparin until Monday 02/11.  Donielle M ZimmermanPA-C 12/31/2017,7:09 AM  Vac functioning Poss change tomorrow and decide about home vac I have seen and examined Victor [name] and agree with the above assessment  and plan.  Grace Isaac MD [facility] [phone] Office [phone] 12/31/2017 12:38 PM

## 2018-01-01 DIAGNOSIS — E119 Type 2 diabetes mellitus without complications: Secondary | ICD-10-CM

## 2018-01-01 DIAGNOSIS — A4901 Methicillin susceptible Staphylococcus aureus infection, unspecified site: Secondary | ICD-10-CM

## 2018-01-01 DIAGNOSIS — Z794 Long term (current) use of insulin: Secondary | ICD-10-CM

## 2018-01-01 DIAGNOSIS — I110 Hypertensive heart disease with heart failure: Secondary | ICD-10-CM

## 2018-01-01 DIAGNOSIS — D649 Anemia, unspecified: Secondary | ICD-10-CM | POA: Diagnosis present

## 2018-01-01 DIAGNOSIS — B9561 Methicillin susceptible Staphylococcus aureus infection as the cause of diseases classified elsewhere: Secondary | ICD-10-CM

## 2018-01-01 DIAGNOSIS — T827XXA Infection and inflammatory reaction due to other cardiac and vascular devices, implants and grafts, initial encounter: Secondary | ICD-10-CM

## 2018-01-01 DIAGNOSIS — Z9981 Dependence on supplemental oxygen: Secondary | ICD-10-CM

## 2018-01-01 DIAGNOSIS — L02211 Cutaneous abscess of abdominal wall: Secondary | ICD-10-CM

## 2018-01-01 DIAGNOSIS — Z87891 Personal history of nicotine dependence: Secondary | ICD-10-CM

## 2018-01-01 DIAGNOSIS — I481 Persistent atrial fibrillation: Secondary | ICD-10-CM

## 2018-01-01 DIAGNOSIS — I5042 Chronic combined systolic (congestive) and diastolic (congestive) heart failure: Secondary | ICD-10-CM

## 2018-01-01 DIAGNOSIS — G4733 Obstructive sleep apnea (adult) (pediatric): Secondary | ICD-10-CM

## 2018-01-01 DIAGNOSIS — R001 Bradycardia, unspecified: Secondary | ICD-10-CM

## 2018-01-01 DIAGNOSIS — Z888 Allergy status to other drugs, medicaments and biological substances status: Secondary | ICD-10-CM

## 2018-01-01 DIAGNOSIS — R011 Cardiac murmur, unspecified: Secondary | ICD-10-CM

## 2018-01-01 DIAGNOSIS — Z8249 Family history of ischemic heart disease and other diseases of the circulatory system: Secondary | ICD-10-CM

## 2018-01-01 DIAGNOSIS — J449 Chronic obstructive pulmonary disease, unspecified: Secondary | ICD-10-CM

## 2018-01-01 LAB — BPAM RBC
Blood Product Expiration Date: 201902222359
Blood Product Expiration Date: 201903032359
Unit Type and Rh: 9500
Unit Type and Rh: 9500

## 2018-01-01 LAB — BASIC METABOLIC PANEL
Anion gap: 10 (ref 5–15)
BUN: 15 mg/dL (ref 6–20)
CHLORIDE: 99 mmol/L — AB (ref 101–111)
CO2: 29 mmol/L (ref 22–32)
Calcium: 8 mg/dL — ABNORMAL LOW (ref 8.9–10.3)
Creatinine, Ser: 1.21 mg/dL (ref 0.61–1.24)
GFR calc non Af Amer: 55 mL/min — ABNORMAL LOW (ref >60)
Glucose, Bld: 242 mg/dL — ABNORMAL HIGH (ref 65–99)
Potassium: 3.8 mmol/L (ref 3.5–5.1)
Sodium: 138 mmol/L (ref 135–145)

## 2018-01-01 LAB — CBC
HCT: 26.1 % — ABNORMAL LOW (ref 39.0–52.0)
Hemoglobin: 7.8 g/dL — ABNORMAL LOW (ref 13.0–17.0)
MCH: 25.1 pg — ABNORMAL LOW (ref 26.0–34.0)
MCHC: 29.9 g/dL — ABNORMAL LOW (ref 30.0–36.0)
MCV: 83.9 fL (ref 78.0–100.0)
Platelets: 168 10*3/uL (ref 150–400)
RBC: 3.11 MIL/uL — ABNORMAL LOW (ref 4.22–5.81)
RDW: 15 % (ref 11.5–15.5)
WBC: 6.3 10*3/uL (ref 4.0–10.5)

## 2018-01-01 LAB — TYPE AND SCREEN
ABO/RH(D): O NEG
Antibody Screen: POSITIVE
Donor AG Type: NEGATIVE
Donor AG Type: NEGATIVE
Unit division: 0
Unit division: 0

## 2018-01-01 LAB — GLUCOSE, CAPILLARY
GLUCOSE-CAPILLARY: 239 mg/dL — AB (ref 65–99)
Glucose-Capillary: 186 mg/dL — ABNORMAL HIGH (ref 65–99)
Glucose-Capillary: 189 mg/dL — ABNORMAL HIGH (ref 65–99)
Glucose-Capillary: 229 mg/dL — ABNORMAL HIGH (ref 65–99)

## 2018-01-01 LAB — HEPARIN LEVEL (UNFRACTIONATED): HEPARIN UNFRACTIONATED: 0.41 [IU]/mL (ref 0.30–0.70)

## 2018-01-01 MED ORDER — MUPIROCIN 2 % EX OINT
TOPICAL_OINTMENT | Freq: Two times a day (BID) | CUTANEOUS | Status: DC
  Administered 2018-01-01 – 2018-01-10 (×18): via NASAL
  Filled 2018-01-01 (×4): qty 22

## 2018-01-01 MED ORDER — INSULIN GLARGINE 100 UNIT/ML ~~LOC~~ SOLN
10.0000 [IU] | Freq: Every day | SUBCUTANEOUS | Status: DC
  Administered 2018-01-01 – 2018-01-09 (×9): 10 [IU] via SUBCUTANEOUS
  Filled 2018-01-01 (×10): qty 0.1

## 2018-01-01 NOTE — Clinical Social Work Note (Signed)
Clinical Social Work Assessment  Patient Details  Name: Victor [name] MRN: [id number] Date of Birth: 1936-11-03  Date of referral:  01/01/18               Reason for consult:  Discharge Planning, Facility Placement                Permission sought to share information with:  Family Supports Permission granted to share information::  Yes, Verbal Permission Granted  Name::     [place]::  snf  Relationship::  grandson  Contact Information:  [phone]  Housing/Transportation Living arrangements for the past 2 months:  [place] of Information:  Patient, Adult Children Patient Interpreter Needed:  None Criminal Activity/Legal Involvement Pertinent to Current Situation/Hospitalization:  No - Comment as needed Significant Relationships:  Adult Children, Other Family Members Lives with:  Self(per pt he lives with grandson but per grandson pt lives on his own) Do you feel safe going back to the place where you live?  Yes Need for family participation in patient care:  Yes (Comment)  Care giving concerns:  No family at bedside   Social Worker assessment / plan: CSW spoke with patient at bedside to discuss discharge plan. Patient stated he lives with his grandson and has support from family. Patient is aware that PT is recommending home with home health but stated he is open to SNF  CSW reached out to patients grandson [name] and daughter Lynelle Smoke via phone. Per [name] (pt grandson) he is patients POA. Josh stated that the past month patient has been living with him and his wife but it was never a long term thing. [name] stated that patient lives on his own and upon discharge he will be alone since [place] and wife will be working. Josh stated he is unable to care for patient and is unable to meet his needs. Josh stated patient will not have 24/hr supervision. Employment status:  Retired Health visitor PT Recommendations:  Home with [place] /  Referral to community resources:  [place]  Patient/Family's Response to care:  Patient agreeable to go to snf to do short term rehab  Patient/Family's Understanding of and Emotional Response to Diagnosis, Current Treatment, and Prognosis: CSW to follow up with family and patient once bed offers are available   Emotional Assessment Appearance:  Appears stated age Attitude/Demeanor/Rapport:  Other Affect (typically observed):  Accepting Orientation:  Oriented to Self, Oriented to Place, Oriented to  Time, Oriented to Situation Alcohol / Substance use:  Not Applicable Psych involvement (Current and /or in the community):  Outpatient Provider  Discharge Needs  Concerns to be addressed:  No discharge needs identified Readmission within the last 30 days:  Yes Current discharge risk:  Lives alone Barriers to Discharge:  No Barriers Identified   Wende Neighbors, LCSW 01/01/2018, 4:37 PM

## 2018-01-01 NOTE — Progress Notes (Addendum)
      [address]       [place] [address]             [phone]      3 Days Post-Op Procedure(s) (LRB): IRRIGATION AND DEBRIDEMENT ABDOMINAL WALL ABSCESS (Left) APPLICATION OF WOUND VAC (Left)   Subjective:  Some pain around wound vac site, but it manageable.  + BM  Objective: Vital signs in last 24 hours: Temp:  [97.5 F (36.4 C)] 97.5 F (36.4 C) (02/10 1956) Pulse Rate:  [65-66] 66 (02/11 1203) Cardiac Rhythm: Heart block (02/11 0815) Resp:  [15-17] 15 (02/11 1203) BP: (151)/(57-73) 151/57 (02/11 1203) SpO2:  [97 %-98 %] 98 % (02/11 1203)  Intake/Output from previous day: 02/10 0701 - 02/11 0700 In: 640 [P.O.:600; I.V.:40] Out: 951 [Urine:900; Drains:50; Stool:1] Intake/Output this shift: Total I/O In: 480 [P.O.:480] Out: -   General appearance: alert, cooperative and no distress Heart: irregularly irregular rhythm Lungs: clear to auscultation bilaterally Abdomen: soft, non-tender; bowel sounds normal; no masses,  no organomegaly Extremities: extremities normal, atraumatic, no cyanosis or edema Wound: wound vac in place, minimal erythema surrounding pocket  Lab Results: Recent Labs    12/31/17 0333 01/01/18 0219  WBC 8.3 6.3  HGB 8.4* 7.8*  HCT 27.4* 26.1*  PLT 208 168   BMET:  Recent Labs    12/30/17 0151 12/31/17 0333  NA 136 138  K 4.3 4.2  CL 96* 97*  CO2 26 31  GLUCOSE 330* 177*  BUN 22* 28*  CREATININE 2.01* 1.64*  CALCIUM 7.9* 8.1*    PT/INR: No results for input(s): LABPROT, INR in the last 72 hours. ABG    Component Value Date/Time   PHART 7.475 (H) 12/06/2017 1730   HCO3 27.4 12/06/2017 1730   TCO2 32 03/20/2015 1638   O2SAT 98.1 12/06/2017 1730   CBG (last 3)  Recent Labs    12/31/17 2139 01/01/18 0615 01/01/18 1106  GLUCAP 249* 186* 189*    Assessment/Plan: S/P Procedure(s) (LRB): IRRIGATION AND DEBRIDEMENT ABDOMINAL WALL ABSCESS (Left) APPLICATION OF WOUND VAC (Left)  1. CV- A. Fib, rate  controlled- on Amiodarone, Bisoprolol- was scheduled for cardioversion on outpatient basis for several weeks, on heparin per pharmacy 2. ID- wound vac in place, change every MWF, remains afebrile, on Ancef--- ID following, ABX per recommendations, OR culture growing Staph. 3. Dispo- care per primary, wound vac change today, on Heparin for A. Fib   LOS: 6 days    Ellwood Handler 01/01/2018  Induration and erythema around abdominal wound significantly improved Wound VAC sponge change today Heparin resumed with plan to transition to oral Eliquis for A. Fib Continue to change wound VAC on Monday Wednesday Friday schedule Patient will need to remain hospitalized this week to establish clean granulation base of wound Exposed AICD leads in abdominal wound were removed Patient not a candidate for removal of epicardial AICD leads    patient examined and medical record reviewed,agree with above note. Tharon Aquas Trigt III 01/01/2018

## 2018-01-01 NOTE — Evaluation (Signed)
Physical Therapy Evaluation Patient Details Name: Victor [name] MRN: [id number] DOB: 10-03-1936 Today's Date: 01/01/2018   History of Present Illness  82yo male with history of recent admission for I&D of hematoma and JP drain placement secondary to fluid surrounding epicardial wires of abandoned epicardial system, also found to have A-fib during this stay. JP drain was removed on 12/15/17. He then discharged home but noticed increased redness and pain at the drain site, wound continued to progress and eventually began to have puss flowing out of it. Diagnosed with L rectus sheath abdominal wall abscess. Received I&D, wound vac placement on 12/29/17. PMH A-fib, OA, cardiomyopathy, CHF, CKD, COPD, CAD, HTN, hx home O2 use, hx orthostatic hypotension, hx V-tach, PVD, CVA, TIA, DM, hx cardiac cath, hx CABG, ICD placement   Clinical Impression   Patient received in bed, sleeping but easily woken and willing to participate in PT this morning. He is able to complete functional bed mobility with S and cues for rolling, sidelying to sit transfers due to abdominal wound, otherwise able to complete functional transfers and gait also with S and assistance for line management today. Patient reports he does not feel that he is at his baseline at this point due to his recent hospital stays. He was left sitting up at EOB with all needs met and lunch set up, educated to call for nursing staff assistance for back to bed due to need for line management, all other needs met this morning. He will likely benefit from skilled HHPT services moving forward.     Follow Up Recommendations Home health PT;Supervision/Assistance - 24 hour    Equipment Recommendations  None recommended by PT    Recommendations for Other Services       Precautions / Restrictions Precautions Precautions: Fall;Other (comment) Precaution Comments: wound vac, abdominal wound  Restrictions Weight Bearing Restrictions: No      Mobility  Bed  Mobility Overal bed mobility: Needs Assistance Bed Mobility: Rolling;Sidelying to Sit Rolling: Supervision Sidelying to sit: Supervision       General bed mobility comments: S for safety, cues for rolling due to abdominal wound  Transfers Overall transfer level: Needs assistance Equipment used: Rolling walker (2 wheeled) Transfers: Sit to/from Stand Sit to Stand: Supervision         General transfer comment: S for safety, line management   Ambulation/Gait Ambulation/Gait assistance: Supervision Ambulation Distance (Feet): 30 Feet(in room ) Assistive device: Rolling walker (2 wheeled) Gait Pattern/deviations: Step-through pattern;Decreased stride length;Trunk flexed     General Gait Details: Slow, controlled ambulation with RW and supervision for safety. No significant unsteadiness or LOB noted.   Stairs            Wheelchair Mobility    Modified Rankin (Stroke Patients Only)       Balance Overall balance assessment: Needs assistance Sitting-balance support: Bilateral upper extremity supported;Feet supported Sitting balance-Leahy Scale: Normal     Standing balance support: Bilateral upper extremity supported;During functional activity Standing balance-Leahy Scale: Good Standing balance comment: UE support during dynamic balance is beneficial                              Pertinent Vitals/Pain Pain Assessment: No/denies pain Pain Score: 0-No pain Pain Intervention(s): Limited activity within patient's tolerance;Monitored during session    [place] expects to be discharged to:: Private residence Living Arrangements: Other relatives Available Help at Discharge: Family;Available 24 hours/day Type of Home: House  Home Access: Stairs to enter Entrance Stairs-Rails: Right;Left Entrance Stairs-Number of Steps: 3 Home Layout: One level Home Equipment: Walker - 2 wheels;Walker - 4 wheels;Bedside commode;Shower seat;Electric  scooter Additional Comments: If pt stays at his own home, family stays with him. Normally he stays at grandson's home with family available for assist    Prior Function Level of Independence: Independent with assistive device(s)         Comments: Mod indep with rollator or SPC inside home. Uses electric scooter for longer distances outside; O2 at 2 liters all the time     Hand Dominance   Dominant Hand: Right    Extremity/Trunk Assessment   Upper Extremity Assessment Upper Extremity Assessment: Overall WFL for tasks assessed    Lower Extremity Assessment Lower Extremity Assessment: Generalized weakness    Cervical / Trunk Assessment Cervical / Trunk Assessment: Kyphotic  Communication   Communication: No difficulties  Cognition Arousal/Alertness: Awake/alert Behavior During Therapy: WFL for tasks assessed/performed Overall Cognitive Status: Within Functional Limits for tasks assessed                                        General Comments      Exercises     Assessment/Plan    PT Assessment Patient needs continued PT services  PT Problem List Decreased strength;Decreased mobility;Decreased coordination;Decreased balance       PT Treatment Interventions DME instruction;Gait training;Stair training;Functional mobility training;Therapeutic activities;Therapeutic exercise;Balance training;Patient/family education;Neuromuscular re-education    PT Goals (Current goals can be found in the Care Plan section)  Acute Rehab PT Goals Patient Stated Goal: to go home PT Goal Formulation: With patient Time For Goal Achievement: 01/05/18 Potential to Achieve Goals: Good    Frequency Min 3X/week   Barriers to discharge        Co-evaluation               AM-PAC PT "6 Clicks" Daily Activity  Outcome Measure Difficulty turning over in bed (including adjusting bedclothes, sheets and blankets)?: None Difficulty moving from lying on back to sitting  on the side of the bed? : None Difficulty sitting down on and standing up from a chair with arms (e.g., wheelchair, bedside commode, etc,.)?: None Help needed moving to and from a bed to chair (including a wheelchair)?: None Help needed walking in hospital room?: A Little Help needed climbing 3-5 steps with a railing? : A Little 6 Click Score: 22    End of Session Equipment Utilized During Treatment: Gait belt;Oxygen Activity Tolerance: Patient tolerated treatment well Patient left: in bed;with call bell/phone within reach(seated at EOB per his request for lunch ) Nurse Communication: Other (comment)(CNA educated that patient will need help with line management for back to bed ) PT Visit Diagnosis: Other abnormalities of gait and mobility (R26.89);Muscle weakness (generalized) (M62.81)    Time: 1105-1130 PT Time Calculation (min) (ACUTE ONLY): 25 min   Charges:   PT Evaluation $PT Eval Moderate Complexity: 1 Mod PT Treatments $Gait Training: 8-22 mins   PT G Codes:        Deniece Ree PT, DPT, CBIS  Supplemental Physical Therapist [place]   Pager [phone]

## 2018-01-01 NOTE — Op Note (Signed)
NAME:  Victor [name], Victor [name] NO.:  MEDICAL RECORD NO.:  [id number]  LOCATION:                                 FACILITY:  PHYSICIAN:  Ivin Poot, M.D.  DATE OF BIRTH:  1936-11-20  DATE OF PROCEDURE:  12/29/2017 DATE OF DISCHARGE:                              OPERATIVE REPORT   OPERATIONS: 1. Incision and drainage with irrigation of left upper quadrant     abdominal wound old AICD pocket. 2. Placement of wound VAC drainage system.  SURGEON:  Ivin Poot, MD.  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  ANESTHESIA:  General.  INDICATIONS:  The patient was brought from the preoperative holding area to the operating room after documentation of informed consent had been obtained and the proper site marked.  I discussed the procedure of incision and drainage of the AICD pocket with the patient and his son including the expected benefits, alternatives, and risks of bleeding, recurrent infection, anesthetic reaction including hypertension and death.  He had given informed consent.  DESCRIPTION OF PROCEDURE:  The patient was placed supine on the operating table and general anesthesia was obtained.  The left chest and upper abdomen were prepped and draped.  A proper time-out was performed. The previously placed incision was then opened.  The tissue was indurated.  There was small amount of purulent material mainly liquid in the pocket.  This was completely drained.  All loculations in the pocket were opened and a half liter of vancomycin irrigation was then used to irrigate the pocket.  The old capsule was scraped with the curette and completely cleaned out.  Next, a small wound VAC sponge was cut to the appropriate size and shape.  The wound was approximately 3 cm  deep, 6 cm from top to bottom and 6 cm wide.  The wound VAC sponge was covered with a sterile drape and suction line was applied and connected to the Pleur-evac vacuum with good collapse of the sponge.  The patient was then reversed from anesthesia and returned to the recovery room in stable condition.     Ivin Poot, M.D.     PV/MEDQ  D:  12/31/2017  T:  12/31/2017  Job:  [id number]

## 2018-01-01 NOTE — Progress Notes (Signed)
Inpatient Diabetes Program Recommendations  AACE/ADA: New Consensus Statement on Inpatient Glycemic Control (2015)  Target Ranges:  Prepandial:   less than 140 mg/dL      Peak postprandial:   less than 180 mg/dL (1-2 hours)      Critically ill patients:  140 - 180 mg/dL   Lab Results  Component Value Date   GLUCAP 186 (H) 01/01/2018   HGBA1C 8.6 (H) 12/26/2017    Review of Glycemic ControlResults for DONTRELLE, MAZON (MRN [id number]) as of 01/01/2018 10:30  Ref. Range 12/31/2017 08:08 12/31/2017 11:52 12/31/2017 16:39 12/31/2017 21:39 01/01/2018 06:15  Glucose-Capillary Latest Ref Range: 65 - 99 mg/dL 137 (H) 252 (H) 218 (H) 249 (H) 186 (H)   Diabetes history: Type 2 DM Outpatient Diabetes medications: Levemir 10 units q AM, Tradjenta 5 mg daily Current orders for Inpatient glycemic control:  Novolog sensitive tid with meals and HS, Lantus 5 units q HS  Inpatient Diabetes Program Recommendations:   Please consider increasing Lantus to 10 units q HS. Also consider adding Novolog meal coverage 3 units tid with meals (hold if patient eats less than 50%).   Thanks,  Adah Perl, RN, BC-ADM Inpatient Diabetes Coordinator Pager [phone] (8a-5p)

## 2018-01-01 NOTE — Consult Note (Signed)
Date of Admission:  12/26/2017          Reason for Consult: old ICD site abscess (abdomen)    Referring Provider: Dr. Lonny Prude   Assessment: Victor [name] is a 82yo male with PMH of CHF s/p ICD, HTN, OSA, COPD on chronic oxygen, T2DM, A fib hospitalized for recurrent abdominal wall abscess at site of previous abdominal ICD which has been out for several years, however the leads remain. He is s/p repeat I&D on 2/8 with would culture growing pan sensitive Staph aureus. Blood cultures have remained negative, however possibly inaccurate results as patient had outpt ceftriaxone prior to last cultures being drawn. Since ICD leads are communicating to the abscess site there is definite concern for lead infection and endocarditis. Uncertain if abd leads are even able to be removed at this point as they were placed in 1970.   Plan: 1. Continue cefazolin for now; will plan on at least 2 week course after I&D (2/8) 2. Consult cardiology for possible TEE to eval for lead infection as old abd ICD leads still in place and communicate with abd abscess\ 3. Not candidate for rifampin due to inc risk of bleeding with blood thinners  Principal Problem:   Abdominal wall abscess Active Problems:   Insulin dependent type 2 diabetes mellitus, controlled (HCC)   Essential hypertension   GERD (gastroesophageal reflux disease)   Hx of CABG 1990   OSA (obstructive sleep apnea)   Stage 3 chronic kidney disease (HCC)   AKI (acute kidney injury) ([place])   Atrial fibrillation, persistent (HCC)   COPD (chronic obstructive pulmonary disease) (HCC)   Scheduled Meds: . amiodarone  200 mg Oral Daily  . bisoprolol  2.5 mg Oral Daily  . DULoxetine  30 mg Oral Daily  . insulin aspart  0-5 Units Subcutaneous QHS  . insulin aspart  0-9 Units Subcutaneous TID WC  . insulin glargine  10 Units Subcutaneous QHS  . mouth rinse  15 mL Mouth Rinse BID  . mupirocin ointment   Nasal BID  . pantoprazole  40 mg Oral Daily  .  pravastatin  40 mg Oral q1800  . senna-docusate  1 tablet Oral BID   Continuous Infusions: . sodium chloride 10 mL/hr at 12/31/17 0600  .  ceFAZolin (ANCEF) IV Stopped (01/01/18 1239)  . heparin 800 Units/hr (01/01/18 0618)   PRN Meds:.acetaminophen **OR** acetaminophen, HYDROcodone-acetaminophen, methocarbamol, MUSCLE RUB, nitroGLYCERIN, ondansetron **OR** ondansetron (ZOFRAN) IV, polyethylene glycol  HPI: Victor [name] is a 82 y.o. male w/ h/o CHF s/p ICD, T2DM, HTN, HLD, COPD on chronic oxygen who presented to [facility] with increasing redness and purulence at old ICD generator site. Patient had incidental finding of fluid collection around epicardial leads in LLQ (study obtained to eval for dissection); he underwent U/S guided aspiration on 1/15 which yielded brown, nonpurulent fluid; at that time, ID was consulted and did not recommend antibiotics as at that time he had no other significant signs of infection and fluid collection was thought to be 2/2 hematoma. As aspiration was incomplete, he underwent OR I&D on 1/24 which again noted hematoma in old generator pocket site which communicated to rectus sheath hematoma; a JP drain was placed at this time; fluid culture was positive for pan sensitive Staph aureus this was treated with cefuroxime. He returned to the ED on 2/5 with increasing redness and purulence of this site; he noted that he was seen by PCP 2 days prior for same and started on antibiotics,  however area had gotten worse. Patient underwent another I&D which yielded purulent fluid again positive for pan sensitive Staph aureus. He has had blood cultures from 1/14, 1/16, and 2/5 (after administration of rocephin outpatient) which have all been negative.    Review of Systems: Review of Systems  Constitutional: Negative for chills and fever.  Respiratory: Negative for cough and shortness of breath.   Cardiovascular: Negative for chest pain and leg swelling.  Gastrointestinal: Positive for  abdominal pain. Negative for nausea and vomiting.  Skin: Negative for rash.  Neurological: Negative for focal weakness.    Past Medical History:  Diagnosis Date  . Anemia   . Arthritis   . Atrial fibrillation, persistent ([place]) 12/15/2017  . Cardiomyopathy, ischemic    a. s/p ICD placement  . Chronic combined systolic and diastolic CHF (congestive heart failure) ([place])   . CKD (chronic kidney disease), stage III ([place])   . COPD (chronic obstructive pulmonary disease) ([place])   . Coronary artery disease    a. s/p CABG 1990. b. multiple PCIs since that time including CTO angioplasty (without stenting) PCI of RCA 09/2014, with last cath 02/2015 done for worsening CHF with unsuccessful attempt at PCI of the RCA due to inability to cross occlusion - medical therapy advised at that time.  . Debilitated 12/15/2017  . GERD (gastroesophageal reflux disease)   . HLD (hyperlipidemia)   . Hypertension   . Occlusion and stenosis of carotid artery without mention of cerebral infarction   . On home oxygen therapy    "2L; 24/7" (11/08/2017)  . Orthostatic hypotension   . Paroxysmal ventricular tachycardia ([place])   . Peripheral vascular disease ([place])   . Raynaud's syndrome   . Stroke [facility])    a. CT head 06/2015: Small old lacunar infarct LEFT basal ganglia.  . Thrombocytopenia ([place])   . TIA (transient ischemic attack)    a. s/p LHC on 10/01/14   . Type II diabetes mellitus (HCC)     Social History   Tobacco Use  . Smoking status: Former Smoker    Years: 3.00    Types: Pipe  . Smokeless tobacco: Never Used  . Tobacco comment: "quit smoking pipe in early 1970's"  Substance Use Topics  . Alcohol use: Yes    Comment: 11/08/2017 "wine w/dinner maybe once/yr; or less"  . Drug use: No    Family History  Problem Relation Age of Onset  . Heart failure Mother 9  . Heart attack Father   . Leukemia Sister   . Stomach cancer Maternal Grandfather    Allergies  Allergen Reactions  . Coreg  [Carvedilol] Other (See Comments)    Shortness of breath ,fatigue ,dizzyness   . Lovastatin Other (See Comments)    CK elevation, Myalgias  . Ativan [Lorazepam] Other (See Comments)    Pt. had side effects    OBJECTIVE: Blood pressure (!) 151/57, pulse 66, temperature (!) 97.5 F (36.4 C), temperature source Oral, resp. rate 15, height 5\' 7"  (1.702 m), weight 218 lb 7.6 oz (99.1 kg), SpO2 98 %.  Physical Exam  Constitutional: NAD, pleasant, lying in bed comfortably HEENT: anicteric CV: RRR, murmur appreciated by Dr. Tommy Medal; no rubs or gallops Resp: CTAB on anterior lung fields, no increased work of breathing Abd: Soft, not distended; wound vac in place on LLQ with surrounding induration with TTP (total area ~6x5cm), +BS Ext: moves all 4 freely; mild increased edema in LLE compared to R (but site of previous vein harvesting for CABG);  no increased warmth or erythema on LLE Skin: erythema and induration in LLQ; no rash; diffuse UE ecchymoses Psych: not depressed or anxious appearing Neuro: CN 2-12 grossly intact  Lab Results Lab Results  Component Value Date   WBC 6.3 01/01/2018   HGB 7.8 (L) 01/01/2018   HCT 26.1 (L) 01/01/2018   MCV 83.9 01/01/2018   PLT 168 01/01/2018    Lab Results  Component Value Date   CREATININE 1.64 (H) 12/31/2017   BUN 28 (H) 12/31/2017   NA 138 12/31/2017   K 4.2 12/31/2017   CL 97 (L) 12/31/2017   CO2 31 12/31/2017    Lab Results  Component Value Date   ALT 13 (L) 12/28/2017   AST 21 12/28/2017   ALKPHOS 87 12/28/2017   BILITOT 0.6 12/28/2017     Microbiology: Recent Results (from the past 240 hour(s))  Culture, blood (routine x 2)     Status: None   Collection Time: 12/26/17 12:50 PM  Result Value Ref Range Status   Specimen Description LEFT ANTECUBITAL  Final   Special Requests   Final    BOTTLES DRAWN AEROBIC AND ANAEROBIC Blood Culture adequate volume   Culture   Final    NO GROWTH 5 DAYS Performed at [facility],  [address] [place], [place] [address]    Report Status 12/31/2017 FINAL  Final  Culture, blood (routine x 2)     Status: None   Collection Time: 12/26/17  1:00 PM  Result Value Ref Range Status   Specimen Description BLOOD RIGHT ARM  Final   Special Requests   Final    BOTTLES DRAWN AEROBIC AND ANAEROBIC Blood Culture adequate volume   Culture   Final    NO GROWTH 5 DAYS Performed at [facility], [place] [address], [place], [place] [address]    Report Status 12/31/2017 FINAL  Final  Aerobic Culture (superficial specimen)     Status: None   Collection Time: 12/27/17 10:41 AM  Result Value Ref Range Status   Specimen Description ABSCESS ABDOMEN LEFT LOWER  Final   Special Requests NONE  Final   Gram Stain   Final    FEW WBC PRESENT, PREDOMINANTLY PMN FEW GRAM POSITIVE COCCI IN PAIRS Performed at [facility], [place] [address], [place], [place] [address]    Culture MODERATE STAPHYLOCOCCUS AUREUS  Final   Report Status 12/29/2017 FINAL  Final   Organism ID, Bacteria STAPHYLOCOCCUS AUREUS  Final      Susceptibility   Staphylococcus aureus - MIC*    CIPROFLOXACIN <=0.5 SENSITIVE Sensitive     ERYTHROMYCIN <=0.25 SENSITIVE Sensitive     GENTAMICIN <=0.5 SENSITIVE Sensitive     OXACILLIN <=0.25 SENSITIVE Sensitive     TETRACYCLINE <=1 SENSITIVE Sensitive     VANCOMYCIN 1 SENSITIVE Sensitive     TRIMETH/SULFA <=10 SENSITIVE Sensitive     CLINDAMYCIN <=0.25 SENSITIVE Sensitive     RIFAMPIN <=0.5 SENSITIVE Sensitive     Inducible Clindamycin NEGATIVE Sensitive     * MODERATE STAPHYLOCOCCUS AUREUS  Surgical pcr screen     Status: Abnormal   Collection Time: 12/27/17  8:59 PM  Result Value Ref Range Status   MRSA, PCR NEGATIVE NEGATIVE Final   Staphylococcus aureus POSITIVE (A) NEGATIVE Final    Comment: (NOTE) The Xpert SA Assay (FDA approved for NASAL specimens in patients 89 years of age and older), is one component of a comprehensive surveillance program. It is not  intended  to diagnose infection nor to guide or monitor treatment. Performed at [facility], [place] [address], [place], [place] [address]   Aerobic/Anaerobic Culture (surgical/deep wound)     Status: None (Preliminary result)   Collection Time: 12/29/17  6:11 PM  Result Value Ref Range Status   Specimen Description FLUID LEFT LOWER ABDOMEN  Final   Special Requests PATIENT ON FOLLOWING VANC AND ZOSYN  Final   Gram Stain   Final    MODERATE WBC PRESENT, PREDOMINANTLY PMN NO ORGANISMS SEEN Performed at [facility], [place] [address], [place], [place] [address]    Culture   Final    FEW STAPHYLOCOCCUS AUREUS NO ANAEROBES ISOLATED; CULTURE IN PROGRESS FOR 5 DAYS    Report Status PENDING  Incomplete   Organism ID, Bacteria STAPHYLOCOCCUS AUREUS  Final      Susceptibility   Staphylococcus aureus - MIC*    CIPROFLOXACIN <=0.5 SENSITIVE Sensitive     ERYTHROMYCIN <=0.25 SENSITIVE Sensitive     GENTAMICIN <=0.5 SENSITIVE Sensitive     OXACILLIN <=0.25 SENSITIVE Sensitive     TETRACYCLINE <=1 SENSITIVE Sensitive     VANCOMYCIN <=0.5 SENSITIVE Sensitive     TRIMETH/SULFA <=10 SENSITIVE Sensitive     CLINDAMYCIN <=0.25 SENSITIVE Sensitive     RIFAMPIN <=0.5 SENSITIVE Sensitive     Inducible Clindamycin NEGATIVE Sensitive     * FEW STAPHYLOCOCCUS AUREUS    Alphonzo Grieve, MD PGY - [facility] [place] [phone] pager  01/01/2018, 1:42 PM

## 2018-01-01 NOTE — Progress Notes (Signed)
PROGRESS NOTE  KONGMENG SANTORO TOI:[id number] DOB: February 05, 1936 DOA: 12/26/2017 PCP: Cyndi Bender, PA-C  HPI/Recap of past 24 hours:  Victor [name] is a 82 y.o. year old male with medical history significant for atrial fibrillation jon eliquis, CAD s/p CABG ( 1990), chronic combined systolic/diastolic CHF s/p ICD, history of VT ( 2012), T2DM, CKD Stage3, HTN, HLD who presented on 12/26/2017 with subjective fevers, chills , and increased pustular drainage, redness an pain of recently drained hematoma and was found to have left rectus sheath abdominal wall abscess. Of note patient was previous admitted from 1/13-1/25 for an infected abdominal wall hematoma.  There is initial concern at that time for potential infection however patient remained clinically stable with no signs or symptoms of infection.  Interestingly, superficial wound cultures of hematoma 1/21 grew MSSA.  Patient was discharged after Irrigation and debridement of hematoma with JP drain until 1/25.  Patient was not discharged on antibiotics as it was felt to be an uninfeted hematoma around epicardial wires from an abandoned epicardial system without antibiotics.    Patient underwent incision and drainage with irrigation of left upper quadrant abdominal of old AICD pocket and placement of wound VAC.  Since admission he was empirically treated with vancomycin and Zosyn.  Vancomycin was discontinued on 2/9 due to kidney insufficiency.  Zosyn was switched to cefazolin on 2/10 based on intraoperative sensitivities showing MSSA   Subjective Doing well this morning.Watching TV. No complaints overnight. Normal breathing. No cough   Assessment/Plan: Principal Problem:   Abdominal wall abscess Active Problems:   Insulin dependent type 2 diabetes mellitus, controlled (HCC)   Essential hypertension   GERD (gastroesophageal reflux disease)   Hx of CABG 1990   OSA (obstructive sleep apnea)   Stage 3 chronic kidney disease (HCC)   AKI (acute  kidney injury) (HCC)   Atrial fibrillation, persistent (HCC)   COPD (chronic obstructive pulmonary disease) (HCC)   Normocytic anemia  MSSA Left rectus sheath abdominal wall abscess related to old epicardial leads status post I&D on 2/8 and wound VAC placement, stable Remains stable, no signs of Sepsis. No bacteremia Previous admission on 1/13-1/25/19 believed to be uninfected abd wall hematoma.  Superficial Wound cultures from last hospitalization showed MSSA(12/11/17). Blood cultures were negative Intraoperative culture on 2/8 shows MSSA(resulted 2/10) Blood cultures during this admission have remained negative Switched from Zosyn to Cefazolin on 2/10. Empiric Vancomycin d/c in setting of AKI on 2/8 -Consult ID regarding duration, suspect will likely need a PICC, may need to discuss with EP given wires still present ( for needed ICD) -Continue Cefazolin -Wound vac in place and functional, CTS following. Continue to monitor.    AKI on CKD Stage 3, improving Creatinine 1.6 down from peak of 2.3 ( up from baseline of 1.4-1.7) on 2/10.  Suspect related to vancomycin toxicity (Vanc trough on 2/8:17)MSSA so no need to continue vancomycin    Atrial Fibrillation, rate controlled New onset from previous hospitalization on 11/2017. Continue bisoprolol and Amiodarone The previous plan was for DCCV in 3-4 weeks as an outpatient. Holding home eliquis, Resumed heparin per pharmacy protocol after surgery today Closely monitor CBC in light of recent surgery while on heparin gtt  Acute on Chronic Anemia, worsened due to blood loss from abdominal hematoma Remote iron panel shows iron deficiency anemia Baseline hgb 9-10 Presented with infected hematoma s/p ID so some blood loss since hospitalization Hgb currently 7.8 Type and screen Continue to monitor cbc, transfuse for goal hgb >  7  Combined systolic/diastolic CHF, stable Some crackles at base of lungs, trace pitting edema. Back on home regimen of 3 L  [place]. Only minimal edema in legs and trace crackles of lungs so will hold off on any diuresis given he has no subjective complaints.  Continue to monitor  COPD on home oxygen 3L, stable No wheezing or respiratory distress.  Currently on 4 L likely transient increase in oxygen requirement after recent I&D, wean oxygen , goal SpO2 > 88%  T2DM, controlled, A1c 8.6(12/2017) Range 130-210 in last 24 hours Increase lantus to 10 units, monitor blood glucose, sliding scale  HLD, stable Continue home statin  GERD, stable Continue protonix  Code Status: DNR  Family Communication: No family at bedside  Disposition Plan: Cefazolin ID consult to determine duration, CTS following   Consultants:  CT surgery  Procedures: None  Antimicrobials:  IV vancomycin 2/5--2/8  IV Zosyn 2/5--    Cultures:  Superficial culture 12/27/17: MSSA(pan sensitive)  DVT prophylaxis: On home eliquis, currently holding heparin gtt until 2/11 given recent procedure  Objective: Vitals:   12/31/17 0351 12/31/17 1419 12/31/17 1956 01/01/18 1203  BP: (!) 156/68 (!) 137/58 (!) 151/73 (!) 151/57  Pulse: 78 64 65 66  Resp: 20  17 15   Temp: 97.6 F (36.4 C) 97.8 F (36.6 C) (!) 97.5 F (36.4 C)   TempSrc: Oral Oral Oral Oral  SpO2: 100%  97% 98%  Weight:      Height:        Intake/Output Summary (Last 24 hours) at 01/01/2018 1410 Last data filed at 01/01/2018 1203 Gross per 24 hour  Intake 600 ml  Output 650 ml  Net -50 ml   Filed Weights   12/26/17 1206 12/29/17 1637 12/29/17 2011  Weight: 68.5 kg (151 lb) 68.5 kg (151 lb) 99.1 kg (218 lb 7.6 oz)    Exam:  Gen. Alert, lying in bed, no distress Resp. 3 L [place], minimal crackles at bases, no wheezing , no distress CV. Irregularly irregular rhythm, normal rate, SEM 3/6 loudest in LUSB GI. Soft abdomen, wound vac in place, draining appropriately in cannister  Extremities. Trace pitting edema bilaterally  Data Reviewed: CBC: Recent Labs  Lab  12/26/17 1218  12/28/17 0749 12/29/17 0707 12/30/17 0151 12/31/17 0333 01/01/18 0219  WBC 10.8*   < > 6.0 6.8 6.1 8.3 6.3  NEUTROABS 8.9*  --   --  4.7  --   --   --   HGB 9.0*   < > 8.9* 9.4* 9.3* 8.4* 7.8*  HCT 29.6*   < > 27.2* 30.7* 30.7* 27.4* 26.1*  MCV 82.9   < > 82.9 82.5 83.4 82.5 83.9  PLT 181   < > 142* 188 194 208 168   < > = values in this interval not displayed.   Basic Metabolic Panel: Recent Labs  Lab 12/28/17 0749 12/28/17 1923 12/29/17 1554 12/30/17 0151 12/31/17 0333  NA 136 137 139 136 138  K 3.2* 3.8 3.6 4.3 4.2  CL 92* 93* 96* 96* 97*  CO2 31 31 28 26 31   GLUCOSE 113* 159* 94 330* 177*  BUN 23* 23* 20 22* 28*  CREATININE 2.15* 2.30* 1.97* 2.01* 1.64*  CALCIUM 8.0* 7.9* 8.3* 7.9* 8.1*   GFR: Estimated Creatinine Clearance: 41 mL/min (A) (by C-G formula based on SCr of 1.64 mg/dL (H)). Liver Function Tests: Recent Labs  Lab 12/26/17 1218 12/28/17 0215 12/28/17 0749  AST 22 22 21   ALT 14* 13* 13*  ALKPHOS 100 93 87  BILITOT 0.8 0.6 0.6  PROT 6.1* 5.6* 5.6*  ALBUMIN 2.4* 2.1* 2.2*   No results for input(s): LIPASE, AMYLASE in the last 168 hours. No results for input(s): AMMONIA in the last 168 hours. Coagulation Profile: Recent Labs  Lab 12/28/17 0215 12/28/17 0749  INR 2.03 1.94   Cardiac Enzymes: No results for input(s): CKTOTAL, CKMB, CKMBINDEX, TROPONINI in the last 168 hours. BNP (last 3 results) No results for input(s): PROBNP in the last 8760 hours. HbA1C: No results for input(s): HGBA1C in the last 72 hours. CBG: Recent Labs  Lab 12/31/17 1152 12/31/17 1639 12/31/17 2139 01/01/18 0615 01/01/18 1106  GLUCAP 252* 218* 249* 186* 189*   Lipid Profile: No results for input(s): CHOL, HDL, LDLCALC, TRIG, CHOLHDL, LDLDIRECT in the last 72 hours. Thyroid Function Tests: No results for input(s): TSH, T4TOTAL, FREET4, T3FREE, THYROIDAB in the last 72 hours. Anemia Panel: No results for input(s): VITAMINB12, FOLATE, FERRITIN,  TIBC, IRON, RETICCTPCT in the last 72 hours. Urine analysis:    Component Value Date/Time   COLORURINE YELLOW 12/28/2017 0016   APPEARANCEUR CLOUDY (A) 12/28/2017 0016   LABSPEC 1.026 12/28/2017 0016   PHURINE 5.0 12/28/2017 0016   GLUCOSEU NEGATIVE 12/28/2017 0016   HGBUR NEGATIVE 12/28/2017 0016   BILIRUBINUR NEGATIVE 12/28/2017 0016   KETONESUR NEGATIVE 12/28/2017 0016   PROTEINUR NEGATIVE 12/28/2017 0016   NITRITE NEGATIVE 12/28/2017 0016   LEUKOCYTESUR NEGATIVE 12/28/2017 0016   Sepsis Labs: @LABRCNTIP (procalcitonin:4,lacticidven:4)  ) Recent Results (from the past 240 hour(s))  Culture, blood (routine x 2)     Status: None   Collection Time: 12/26/17 12:50 PM  Result Value Ref Range Status   Specimen Description LEFT ANTECUBITAL  Final   Special Requests   Final    BOTTLES DRAWN AEROBIC AND ANAEROBIC Blood Culture adequate volume   Culture   Final    NO GROWTH 5 DAYS Performed at [facility], [place] [address] [place], [place] [address]    Report Status 12/31/2017 FINAL  Final  Culture, blood (routine x 2)     Status: None   Collection Time: 12/26/17  1:00 PM  Result Value Ref Range Status   Specimen Description BLOOD RIGHT ARM  Final   Special Requests   Final    BOTTLES DRAWN AEROBIC AND ANAEROBIC Blood Culture adequate volume   Culture   Final    NO GROWTH 5 DAYS Performed at [facility], [place] [address], [place], [place] [address]    Report Status 12/31/2017 FINAL  Final  Aerobic Culture (superficial specimen)     Status: None   Collection Time: 12/27/17 10:41 AM  Result Value Ref Range Status   Specimen Description ABSCESS ABDOMEN LEFT LOWER  Final   Special Requests NONE  Final   Gram Stain   Final    FEW WBC PRESENT, PREDOMINANTLY PMN FEW GRAM POSITIVE COCCI IN PAIRS Performed at [facility], [place] [address], [place], [place] [address]    Culture MODERATE STAPHYLOCOCCUS AUREUS  Final   Report Status 12/29/2017 FINAL  Final    Organism ID, Bacteria STAPHYLOCOCCUS AUREUS  Final      Susceptibility   Staphylococcus aureus - MIC*    CIPROFLOXACIN <=0.5 SENSITIVE Sensitive     ERYTHROMYCIN <=0.25 SENSITIVE Sensitive     GENTAMICIN <=0.5 SENSITIVE Sensitive     OXACILLIN <=0.25 SENSITIVE Sensitive     TETRACYCLINE <=1 SENSITIVE Sensitive     VANCOMYCIN 1 SENSITIVE Sensitive  TRIMETH/SULFA <=10 SENSITIVE Sensitive     CLINDAMYCIN <=0.25 SENSITIVE Sensitive     RIFAMPIN <=0.5 SENSITIVE Sensitive     Inducible Clindamycin NEGATIVE Sensitive     * MODERATE STAPHYLOCOCCUS AUREUS  Surgical pcr screen     Status: Abnormal   Collection Time: 12/27/17  8:59 PM  Result Value Ref Range Status   MRSA, PCR NEGATIVE NEGATIVE Final   Staphylococcus aureus POSITIVE (A) NEGATIVE Final    Comment: (NOTE) The Xpert SA Assay (FDA approved for NASAL specimens in patients 2 years of age and older), is one component of a comprehensive surveillance program. It is not intended to diagnose infection nor to guide or monitor treatment. Performed at [facility], [place] [address] [place], [place] [address]   Aerobic/Anaerobic Culture (surgical/deep wound)     Status: None (Preliminary result)   Collection Time: 12/29/17  6:11 PM  Result Value Ref Range Status   Specimen Description FLUID LEFT LOWER ABDOMEN  Final   Special Requests PATIENT ON FOLLOWING VANC AND ZOSYN  Final   Gram Stain   Final    MODERATE WBC PRESENT, PREDOMINANTLY PMN NO ORGANISMS SEEN Performed at [facility], [place] [address], [place], [place] [address]    Culture   Final    FEW STAPHYLOCOCCUS AUREUS NO ANAEROBES ISOLATED; CULTURE IN PROGRESS FOR 5 DAYS    Report Status PENDING  Incomplete   Organism ID, Bacteria STAPHYLOCOCCUS AUREUS  Final      Susceptibility   Staphylococcus aureus - MIC*    CIPROFLOXACIN <=0.5 SENSITIVE Sensitive     ERYTHROMYCIN <=0.25 SENSITIVE Sensitive     GENTAMICIN <=0.5 SENSITIVE Sensitive     OXACILLIN <=0.25  SENSITIVE Sensitive     TETRACYCLINE <=1 SENSITIVE Sensitive     VANCOMYCIN <=0.5 SENSITIVE Sensitive     TRIMETH/SULFA <=10 SENSITIVE Sensitive     CLINDAMYCIN <=0.25 SENSITIVE Sensitive     RIFAMPIN <=0.5 SENSITIVE Sensitive     Inducible Clindamycin NEGATIVE Sensitive     * FEW STAPHYLOCOCCUS AUREUS      Studies: No results found.  Scheduled Meds: . amiodarone  200 mg Oral Daily  . bisoprolol  2.5 mg Oral Daily  . DULoxetine  30 mg Oral Daily  . insulin aspart  0-5 Units Subcutaneous QHS  . insulin aspart  0-9 Units Subcutaneous TID WC  . insulin glargine  10 Units Subcutaneous QHS  . mouth rinse  15 mL Mouth Rinse BID  . mupirocin ointment   Nasal BID  . pantoprazole  40 mg Oral Daily  . pravastatin  40 mg Oral q1800  . senna-docusate  1 tablet Oral BID    Continuous Infusions: . sodium chloride 10 mL/hr at 12/31/17 0600  .  ceFAZolin (ANCEF) IV Stopped (01/01/18 1239)  . heparin 800 Units/hr (01/01/18 0618)     LOS: 6 days     Desiree Hane, MD [facility] Pager [phone]  If 7PM-7AM, please contact night-coverage [email or web address] Password [email or web address] 01/01/2018, 2:10 PM

## 2018-01-01 NOTE — Progress Notes (Signed)
ANTICOAGULATION CONSULT NOTE - Follow Up Consult  Pharmacy Consult for Heparin Indication: atrial fibrillation  Allergies  Allergen Reactions  . Coreg [Carvedilol] Other (See Comments)    Shortness of breath ,fatigue ,dizzyness   . Lovastatin Other (See Comments)    CK elevation, Myalgias  . Ativan [Lorazepam] Other (See Comments)    Pt. had side effects    Patient Measurements: Height: 5\' 7"  (170.2 cm) Weight: 218 lb 7.6 oz (99.1 kg) IBW/kg (Calculated) : 66.1  Heparin Dosing Weight: 68 kg  Vital Signs: Temp Source: Oral (02/11 1203) BP: 151/57 (02/11 1203) Pulse Rate: 66 (02/11 1203)  Labs: Recent Labs    12/29/17 1554  12/30/17 0151 12/31/17 0333 01/01/18 0219 01/01/18 1238  HGB  --    < > 9.3* 8.4* 7.8*  --   HCT  --   --  30.7* 27.4* 26.1*  --   PLT  --   --  194 208 168  --   APTT  --   --  46*  --   --   --   HEPARINUNFRC  --   --   --   --   --  0.41  CREATININE 1.97*  --  2.01* 1.64*  --   --    < > = values in this interval not displayed.    Estimated Creatinine Clearance: 41 mL/min (A) (by C-G formula based on SCr of 1.64 mg/dL (H)).  Assessment: 37 YOM with recently diagnosis AFib who was started on Eliquis in January. Eliquis held for I&D for abdominal wall abscess on 12/29/17. Last Eliquis dose 2/4 prior to admission. Heparin was resumed post-op 2/11 AM per CVTS. Heparin level therapeutic. CBC stable. No overt bleeding issues documented.  Goal of Therapy:  Heparin level 0.3-0.7 units/ml Monitor platelets by anticoagulation protocol: Yes   Plan:  Continue IV heparin at 800 units/hr Monitor daily heparin level and CBC, s/sx bleeding Off Eliquis since admitted 2/5   Elicia Lamp, PharmD, BCPS Clinical Pharmacist Clinical phone for 01/01/2018 until 3:30pm: [phone] If after 3:30pm, please call main pharmacy at: [phone] 01/01/2018 2:38 PM

## 2018-01-02 ENCOUNTER — Other Ambulatory Visit: Payer: Self-pay | Admitting: Cardiothoracic Surgery

## 2018-01-02 DIAGNOSIS — T827XXS Infection and inflammatory reaction due to other cardiac and vascular devices, implants and grafts, sequela: Secondary | ICD-10-CM

## 2018-01-02 DIAGNOSIS — I509 Heart failure, unspecified: Secondary | ICD-10-CM

## 2018-01-02 DIAGNOSIS — Z978 Presence of other specified devices: Secondary | ICD-10-CM

## 2018-01-02 DIAGNOSIS — I4819 Other persistent atrial fibrillation: Secondary | ICD-10-CM

## 2018-01-02 LAB — GLUCOSE, CAPILLARY
GLUCOSE-CAPILLARY: 165 mg/dL — AB (ref 65–99)
GLUCOSE-CAPILLARY: 211 mg/dL — AB (ref 65–99)
Glucose-Capillary: 225 mg/dL — ABNORMAL HIGH (ref 65–99)
Glucose-Capillary: 146 mg/dL — ABNORMAL HIGH (ref 65–99)

## 2018-01-02 LAB — BASIC METABOLIC PANEL
ANION GAP: 12 (ref 5–15)
BUN: 13 mg/dL (ref 6–20)
CALCIUM: 8.2 mg/dL — AB (ref 8.9–10.3)
CO2: 27 mmol/L (ref 22–32)
Chloride: 102 mmol/L (ref 101–111)
Creatinine, Ser: 1.09 mg/dL (ref 0.61–1.24)
GFR calc Af Amer: >60 mL/min (ref >60)
GFR calc non Af Amer: >60 mL/min (ref >60)
GLUCOSE: 222 mg/dL — AB (ref 65–99)
Potassium: 4 mmol/L (ref 3.5–5.1)
Sodium: 141 mmol/L (ref 135–145)

## 2018-01-02 LAB — CBC
HCT: 26.4 % — ABNORMAL LOW (ref 39.0–52.0)
Hemoglobin: 7.8 g/dL — ABNORMAL LOW (ref 13.0–17.0)
MCH: 25 pg — ABNORMAL LOW (ref 26.0–34.0)
MCHC: 29.5 g/dL — ABNORMAL LOW (ref 30.0–36.0)
MCV: 84.6 fL (ref 78.0–100.0)
Platelets: 168 10*3/uL (ref 150–400)
RBC: 3.12 MIL/uL — ABNORMAL LOW (ref 4.22–5.81)
RDW: 15 % (ref 11.5–15.5)
WBC: 7.5 10*3/uL (ref 4.0–10.5)

## 2018-01-02 LAB — HEPARIN LEVEL (UNFRACTIONATED)
Heparin Unfractionated: 0.17 IU/mL — ABNORMAL LOW (ref 0.30–0.70)
Heparin Unfractionated: 0.3 IU/mL (ref 0.30–0.70)

## 2018-01-02 LAB — HIV ANTIBODY (ROUTINE TESTING W REFLEX): HIV SCREEN 4TH GENERATION: NONREACTIVE

## 2018-01-02 LAB — C-REACTIVE PROTEIN: CRP: 1.6 mg/dL — ABNORMAL HIGH (ref <1.0)

## 2018-01-02 LAB — SEDIMENTATION RATE: Sed Rate: 50 mm/hr — ABNORMAL HIGH (ref 0–16)

## 2018-01-02 MED ORDER — SODIUM CHLORIDE 0.9 % IV SOLN
INTRAVENOUS | Status: DC
  Administered 2018-01-03: 03:00:00 via INTRAVENOUS

## 2018-01-02 NOTE — Progress Notes (Signed)
Subjective: Patient endorses mild improvement in pain in abdomen.   Antibiotics:  Anti-infectives (From admission, onward)   Start     Dose/Rate Route Frequency Ordered Stop   12/31/17 1200  ceFAZolin (ANCEF) IVPB 2g/100 mL premix     2 g 200 mL/hr over 30 Minutes Intravenous Every 8 hours 12/31/17 1136     12/29/17 1813  vancomycin (VANCOCIN) 1,000 mg in sodium chloride 0.9 % 1,000 mL irrigation  Status:  Discontinued       As needed 12/29/17 1814 12/29/17 1836   12/29/17 1800  vancomycin (VANCOCIN) IVPB 750 mg/150 ml premix  Status:  Discontinued     750 mg 150 mL/hr over 60 Minutes Intravenous Every 24 hours 12/29/17 1733 12/30/17 1244   12/29/17 1730  vancomycin (VANCOCIN) 1,000 mg in sodium chloride 0.9 % 1,000 mL irrigation  Status:  Discontinued      Irrigation  Once 12/29/17 1723 12/29/17 2003   12/29/17 1430  cefUROXime (ZINACEF) 1.5 g in dextrose 5 % 50 mL IVPB     1.5 g 100 mL/hr over 30 Minutes Intravenous 60 min pre-op 12/28/17 0735 12/29/17 1807   12/27/17 1600  vancomycin (VANCOCIN) IVPB 750 mg/150 ml premix  Status:  Discontinued     750 mg 150 mL/hr over 60 Minutes Intravenous Every 24 hours 12/26/17 1537 12/29/17 1116   12/26/17 2200  piperacillin-tazobactam (ZOSYN) IVPB 3.375 g  Status:  Discontinued     3.375 g 12.5 mL/hr over 240 Minutes Intravenous Every 8 hours 12/26/17 1539 12/31/17 1136   12/26/17 1545  vancomycin (VANCOCIN) 1,500 mg in sodium chloride 0.9 % 500 mL IVPB     1,500 mg 250 mL/hr over 120 Minutes Intravenous  Once 12/26/17 1537 12/26/17 1806   12/26/17 1545  piperacillin-tazobactam (ZOSYN) IVPB 3.375 g  Status:  Discontinued     3.375 g 12.5 mL/hr over 240 Minutes Intravenous Every 8 hours 12/26/17 1537 12/26/17 1539   12/26/17 1515  piperacillin-tazobactam (ZOSYN) IVPB 3.375 g  Status:  Discontinued     3.375 g 100 mL/hr over 30 Minutes Intravenous  Once 12/26/17 1512 12/26/17 1604   12/26/17 1515  vancomycin (VANCOCIN) IVPB 1000  mg/200 mL premix  Status:  Discontinued     1,000 mg 200 mL/hr over 60 Minutes Intravenous  Once 12/26/17 1512 12/26/17 1537      Medications: Scheduled Meds: . amiodarone  200 mg Oral Daily  . bisoprolol  2.5 mg Oral Daily  . DULoxetine  30 mg Oral Daily  . insulin aspart  0-5 Units Subcutaneous QHS  . insulin aspart  0-9 Units Subcutaneous TID WC  . insulin glargine  10 Units Subcutaneous QHS  . mouth rinse  15 mL Mouth Rinse BID  . mupirocin ointment   Nasal BID  . pantoprazole  40 mg Oral Daily  . pravastatin  40 mg Oral q1800  . senna-docusate  1 tablet Oral BID   Continuous Infusions: . sodium chloride 10 mL/hr at 12/31/17 0600  .  ceFAZolin (ANCEF) IV Stopped (01/02/18 0341)  . heparin 1,000 Units/hr (01/02/18 0414)   PRN Meds:.acetaminophen **OR** acetaminophen, HYDROcodone-acetaminophen, methocarbamol, MUSCLE RUB, nitroGLYCERIN, ondansetron **OR** ondansetron (ZOFRAN) IV, polyethylene glycol  Objective: Weight change:   Intake/Output Summary (Last 24 hours) at 01/02/2018 0904 Last data filed at 01/02/2018 0500 Gross per 24 hour  Intake 2020 ml  Output 650 ml  Net 1370 ml   Blood pressure (!) 146/76, pulse 97, temperature 97.8 F (36.6 C),  temperature source Oral, resp. rate 18, height 5\' 7"  (1.702 m), weight 218 lb 7.6 oz (99.1 kg), SpO2 98 %. Temp:  [97.5 F (36.4 C)-97.8 F (36.6 C)] 97.8 F (36.6 C) (02/12 0801) Pulse Rate:  [63-97] 97 (02/12 0801) Resp:  [11-18] 18 (02/12 0801) BP: (121-151)/(57-76) 146/76 (02/12 0801) SpO2:  [94 %-98 %] 98 % (02/12 0801)  Physical Exam: General: Alert and awake, oriented x3, not in any acute distress. HEENT: anicteric sclera CVS: irregularly irregular rhythm, bradycardia, no murmur, rubs, or gallops Chest: Decreased breath sounds throughout esp at bases; no increased work of breathing, no rales or wheezes Abdomen: soft nontender, nondistended, normal bowel sounds; Wound vac in place in LLQ with surrounding induration  and erythema which appears stable from yesterday Extremities: mild bil LE pitting edema L>R Skin: no rashes  CBC: CBC Latest Ref Rng & Units 01/02/2018 01/01/2018 12/31/2017  WBC 4.0 - 10.5 K/uL 7.5 6.3 8.3  Hemoglobin 13.0 - 17.0 g/dL 7.8(L) 7.8(L) 8.4(L)  Hematocrit 39.0 - 52.0 % 26.4(L) 26.1(L) 27.4(L)  Platelets 150 - 400 K/uL 168 168 208    BMET Recent Labs    01/01/18 1424 01/02/18 0226  NA 138 141  K 3.8 4.0  CL 99* 102  CO2 29 27  GLUCOSE 242* 222*  BUN 15 13  CREATININE 1.21 1.09  CALCIUM 8.0* 8.2*   Liver Panel  No results for input(s): PROT, ALBUMIN, AST, ALT, ALKPHOS, BILITOT, BILIDIR, IBILI in the last 72 hours.  Sedimentation Rate Recent Labs    01/02/18 0226  ESRSEDRATE 50*   C-Reactive Protein Recent Labs    01/02/18 0226  CRP 1.6*    Micro Results: Recent Results (from the past 720 hour(s))  Culture, blood (Routine X 2) w Reflex to ID Panel     Status: None   Collection Time: 12/04/17 12:40 PM  Result Value Ref Range Status   Specimen Description BLOOD RIGHT ANTECUBITAL  Final   Special Requests IN PEDIATRIC BOTTLE Blood Culture adequate volume  Final   Culture NO GROWTH 5 DAYS  Final   Report Status 12/09/2017 FINAL  Final  Culture, blood (Routine X 2) w Reflex to ID Panel     Status: None   Collection Time: 12/04/17 12:44 PM  Result Value Ref Range Status   Specimen Description BLOOD RIGHT HAND  Final   Special Requests IN PEDIATRIC BOTTLE Blood Culture adequate volume  Final   Culture NO GROWTH 5 DAYS  Final   Report Status 12/09/2017 FINAL  Final  Body fluid culture     Status: None   Collection Time: 12/05/17  3:35 PM  Result Value Ref Range Status   Specimen Description FLUID ABDOMEN  Final   Special Requests Normal  Final   Gram Stain NO WBC SEEN NO ORGANISMS SEEN   Final   Culture No growth aerobically or anaerobically.  Final   Report Status 12/09/2017 FINAL  Final  Culture, blood (x 2)     Status: None   Collection Time:  12/06/17  8:21 PM  Result Value Ref Range Status   Specimen Description BLOOD HAND LEFT  Final   Special Requests IN PEDIATRIC BOTTLE Blood Culture adequate volume  Final   Culture NO GROWTH 5 DAYS  Final   Report Status 12/11/2017 FINAL  Final  Culture, blood (x 2)     Status: None   Collection Time: 12/06/17  8:22 PM  Result Value Ref Range Status   Specimen Description BLOOD ARM RIGHT  Final   Special Requests IN PEDIATRIC BOTTLE Blood Culture adequate volume  Final   Culture NO GROWTH 5 DAYS  Final   Report Status 12/11/2017 FINAL  Final  MRSA PCR Screening     Status: None   Collection Time: 12/06/17  8:50 PM  Result Value Ref Range Status   MRSA by PCR NEGATIVE NEGATIVE Final    Comment:        The GeneXpert MRSA Assay (FDA approved for NASAL specimens only), is one component of a comprehensive MRSA colonization surveillance program. It is not intended to diagnose MRSA infection nor to guide or monitor treatment for MRSA infections.   Culture, Urine     Status: None   Collection Time: 12/06/17  9:43 PM  Result Value Ref Range Status   Specimen Description URINE, RANDOM  Final   Special Requests NONE  Final   Culture NO GROWTH  Final   Report Status 12/08/2017 FINAL  Final  Anaerobic culture     Status: None   Collection Time: 12/11/17 12:47 PM  Result Value Ref Range Status   Specimen Description WOUND LEFT RECTUS HEMATOMA  Final   Special Requests PATIENT ON FOLLOWING ZINACEF  Final   Culture   Final    RARE STAPHYLOCOCCUS AUREUS NO ANAEROBES ISOLATED    Report Status 12/19/2017 FINAL  Final   Organism ID, Bacteria STAPHYLOCOCCUS AUREUS  Final      Susceptibility   Staphylococcus aureus - MIC*    CIPROFLOXACIN <=0.5 SENSITIVE Sensitive     ERYTHROMYCIN <=0.25 SENSITIVE Sensitive     GENTAMICIN <=0.5 SENSITIVE Sensitive     OXACILLIN <=0.25 SENSITIVE Sensitive     TETRACYCLINE <=1 SENSITIVE Sensitive     VANCOMYCIN 1 SENSITIVE Sensitive     TRIMETH/SULFA  <=10 SENSITIVE Sensitive     CLINDAMYCIN <=0.25 SENSITIVE Sensitive     RIFAMPIN <=0.5 SENSITIVE Sensitive     Inducible Clindamycin NEGATIVE Sensitive     * RARE STAPHYLOCOCCUS AUREUS  Aerobic Culture (superficial specimen)     Status: None   Collection Time: 12/11/17 12:47 PM  Result Value Ref Range Status   Specimen Description WOUND LEFT RECTUS HEMATOMA  Final   Special Requests PATIENT ON FOLLOWING ZINACEF  Final   Gram Stain NO WBC SEEN NO ORGANISMS SEEN   Final   Culture NO GROWTH 3 DAYS  Final   Report Status 12/14/2017 FINAL  Final  Culture, blood (routine x 2)     Status: None   Collection Time: 12/26/17 12:50 PM  Result Value Ref Range Status   Specimen Description LEFT ANTECUBITAL  Final   Special Requests   Final    BOTTLES DRAWN AEROBIC AND ANAEROBIC Blood Culture adequate volume   Culture   Final    NO GROWTH 5 DAYS Performed at [facility], [address], [place], [place] [address]    Report Status 12/31/2017 FINAL  Final  Culture, blood (routine x 2)     Status: None   Collection Time: 12/26/17  1:00 PM  Result Value Ref Range Status   Specimen Description BLOOD RIGHT ARM  Final   Special Requests   Final    BOTTLES DRAWN AEROBIC AND ANAEROBIC Blood Culture adequate volume   Culture   Final    NO GROWTH 5 DAYS Performed at [facility], [place] [address], [place], [place] [address]    Report Status 12/31/2017 FINAL  Final  Aerobic Culture (superficial specimen)     Status:  None   Collection Time: 12/27/17 10:41 AM  Result Value Ref Range Status   Specimen Description ABSCESS ABDOMEN LEFT LOWER  Final   Special Requests NONE  Final   Gram Stain   Final    FEW WBC PRESENT, PREDOMINANTLY PMN FEW GRAM POSITIVE COCCI IN PAIRS Performed at [facility], [place] [address] [place], [place] [address]    Culture MODERATE STAPHYLOCOCCUS AUREUS  Final   Report Status 12/29/2017 FINAL  Final   Organism ID, Bacteria STAPHYLOCOCCUS AUREUS  Final       Susceptibility   Staphylococcus aureus - MIC*    CIPROFLOXACIN <=0.5 SENSITIVE Sensitive     ERYTHROMYCIN <=0.25 SENSITIVE Sensitive     GENTAMICIN <=0.5 SENSITIVE Sensitive     OXACILLIN <=0.25 SENSITIVE Sensitive     TETRACYCLINE <=1 SENSITIVE Sensitive     VANCOMYCIN 1 SENSITIVE Sensitive     TRIMETH/SULFA <=10 SENSITIVE Sensitive     CLINDAMYCIN <=0.25 SENSITIVE Sensitive     RIFAMPIN <=0.5 SENSITIVE Sensitive     Inducible Clindamycin NEGATIVE Sensitive     * MODERATE STAPHYLOCOCCUS AUREUS  Surgical pcr screen     Status: Abnormal   Collection Time: 12/27/17  8:59 PM  Result Value Ref Range Status   MRSA, PCR NEGATIVE NEGATIVE Final   Staphylococcus aureus POSITIVE (A) NEGATIVE Final    Comment: (NOTE) The Xpert SA Assay (FDA approved for NASAL specimens in patients 48 years of age and older), is one component of a comprehensive surveillance program. It is not intended to diagnose infection nor to guide or monitor treatment. Performed at [facility], [place] [address] [place], [place] [address]   Aerobic/Anaerobic Culture (surgical/deep wound)     Status: None (Preliminary result)   Collection Time: 12/29/17  6:11 PM  Result Value Ref Range Status   Specimen Description FLUID LEFT LOWER ABDOMEN  Final   Special Requests PATIENT ON FOLLOWING VANC AND ZOSYN  Final   Gram Stain   Final    MODERATE WBC PRESENT, PREDOMINANTLY PMN NO ORGANISMS SEEN Performed at [facility], [place] [address], [place],  [address]    Culture   Final    FEW STAPHYLOCOCCUS AUREUS NO ANAEROBES ISOLATED; CULTURE IN PROGRESS FOR 5 DAYS    Report Status PENDING  Incomplete   Organism ID, Bacteria STAPHYLOCOCCUS AUREUS  Final      Susceptibility   Staphylococcus aureus - MIC*    CIPROFLOXACIN <=0.5 SENSITIVE Sensitive     ERYTHROMYCIN <=0.25 SENSITIVE Sensitive     GENTAMICIN <=0.5 SENSITIVE Sensitive     OXACILLIN <=0.25 SENSITIVE Sensitive     TETRACYCLINE <=1 SENSITIVE  Sensitive     VANCOMYCIN <=0.5 SENSITIVE Sensitive     TRIMETH/SULFA <=10 SENSITIVE Sensitive     CLINDAMYCIN <=0.25 SENSITIVE Sensitive     RIFAMPIN <=0.5 SENSITIVE Sensitive     Inducible Clindamycin NEGATIVE Sensitive     * FEW STAPHYLOCOCCUS AUREUS   Studies/Results: No results found.  Assessment/Plan:  INTERVAL HISTORY:  TEE scheduled for 2/13 - appreciate cardiology assistance  Principal Problem:   Abdominal wall abscess Active Problems:   Insulin dependent type 2 diabetes mellitus, controlled (HCC)   Essential hypertension   GERD (gastroesophageal reflux disease)   Hx of CABG 1990   OSA (obstructive sleep apnea)   Stage 3 chronic kidney disease (HCC)   AKI (acute kidney injury) ([place])   Atrial fibrillation, persistent (HCC)   COPD (chronic obstructive pulmonary disease) (HCC)   Normocytic  anemia   Infected defibrillator (HCC)   MSSA (methicillin susceptible Staphylococcus aureus) infection  Victor [name] is a 82 y.o. male with PMH of CHF s/p ICD, HTN, OSA, COPD on chronic oxygen, T2DM, A fib hospitalized for recurrent abdominal wall abscess at site of previous abdominal ICD which has been out for several years, however the leads remain. He is s/p repeat I&D and exposed lead removal on 2/8 with would culture growing MS Staph aureus. Blood cultures have remained negative, however possibly inaccurate results as patient had outpt ceftriaxone prior to last cultures being drawn.   Plan: 1. Continue cefazolin for now; will plan on at least 2 week course after I&D (2/8) 2. TEE scheduled for 2/13; if new limb leads appear infected will affect treatment duration and may necessitate "new" ICD replacement 3. F/u HIV ab, HCV ab and HBV sAg   LOS: 7 days   Victor [name]  PGY - 2 Infectious Disease [phone] 01/02/2018, 9:04 AM

## 2018-01-02 NOTE — Progress Notes (Signed)
Inpatient Diabetes Program Recommendations  AACE/ADA: New Consensus Statement on Inpatient Glycemic Control (2015)  Target Ranges:  Prepandial:   less than 140 mg/dL      Peak postprandial:   less than 180 mg/dL (1-2 hours)      Critically ill patients:  140 - 180 mg/dL   Lab Results  Component Value Date   GLUCAP 165 (H) 01/02/2018   HGBA1C 8.6 (H) 12/26/2017    Review of Glycemic ControlResults for TYSHEEM, ACCARDO (MRN [id number]) as of 01/02/2018 11:33  Ref. Range 01/01/2018 11:06 01/01/2018 16:01 01/01/2018 20:49 01/02/2018 05:30 01/02/2018 11:22  Glucose-Capillary Latest Ref Range: 65 - 99 mg/dL 189 (H) 229 (H) 239 (H) 225 (H) 165 (H)   Diabetes history: Type 2 DM Outpatient Diabetes medications: Levemir 10 units q AM, Tradjenta 5 mg daily Current orders for Inpatient glycemic control:  Novolog sensitive tid with meals and HS, Lantus 10 units q HS Inpatient Diabetes Program Recommendations:   May consider increasing Lantus to 14 units q HS.   Thanks, Adah Perl, RN, BC-ADM Inpatient Diabetes Coordinator Pager [phone] (8a-5p)

## 2018-01-02 NOTE — Progress Notes (Signed)
ANTICOAGULATION CONSULT NOTE - Follow Up Consult  Pharmacy Consult for heparin Indication: atrial fibrillation  Labs: Recent Labs    12/31/17 0333 01/01/18 0219 01/01/18 1238 01/01/18 1424 01/02/18 0226  HGB 8.4* 7.8*  --   --  7.8*  HCT 27.4* 26.1*  --   --  26.4*  PLT 208 168  --   --  168  HEPARINUNFRC  --   --  0.41  --  0.17*  CREATININE 1.64*  --   --  1.21 1.09    Assessment: 82yo male subtherapeutic on heparin after one level at goal likely 2/2 Eliquis clearing but RN also notes that IV line has been giving trouble and IVT is changing it now.  Goal of Therapy:  Heparin level 0.3-0.7 units/ml   Plan:  Will inccrease heparin gtt by 2 units/kg/hr to 1000 units/hr and check level in 8 hours.    Wynona Neat, PharmD, BCPS  01/02/2018,4:02 AM

## 2018-01-02 NOTE — Progress Notes (Signed)
    [facility] has been requested to perform a transesophageal echocardiogram on 01/03/2018 for rule out endocarditis.  After careful review of history and examination, the risks and benefits of transesophageal echocardiogram have been explained including risks of esophageal damage, perforation (1:10,000 risk), bleeding, pharyngeal hematoma as well as other potential complications associated with conscious sedation including aspiration, arrhythmia, respiratory failure and death. Alternatives to treatment were discussed, questions were answered. Patient plans to discuss with his son prior to giving written/oral consent. Will follow up with the patient prior to procedure.    Roby Lofts, PA-C 01/02/2018 2:06 PM

## 2018-01-02 NOTE — Progress Notes (Addendum)
      [address]       [place] [address]             [phone]      4 Days Post-Op Procedure(s) (LRB): IRRIGATION AND DEBRIDEMENT ABDOMINAL WALL ABSCESS (Left) APPLICATION OF WOUND VAC (Left) Subjective: Feels ok  Objective: Vital signs in last 24 hours: Temp:  [97.5 F (36.4 C)-97.7 F (36.5 C)] 97.7 F (36.5 C) (02/12 0455) Pulse Rate:  [63-70] 63 (02/12 0455) Cardiac Rhythm: Normal sinus rhythm;Heart block (02/11 1900) Resp:  [11-15] 14 (02/12 0455) BP: (121-151)/(57-73) 121/73 (02/12 0455) SpO2:  [94 %-98 %] 94 % (02/12 0455)  Hemodynamic parameters for last 24 hours:    Intake/Output from previous day: 02/11 0701 - 02/12 0700 In: 2260 [P.O.:960; I.V.:100; IV Piggyback:1200] Out: 750 [Urine:700; Drains:50] Intake/Output this shift: No intake/output data recorded.  General appearance: alert, cooperative and no distress Heart: irregularly irregular rhythm Lungs: dim in lower fields with faint crackles Wound: vac in place  Lab Results: Recent Labs    01/01/18 0219 01/02/18 0226  WBC 6.3 7.5  HGB 7.8* 7.8*  HCT 26.1* 26.4*  PLT 168 168   BMET:  Recent Labs    01/01/18 1424 01/02/18 0226  NA 138 141  K 3.8 4.0  CL 99* 102  CO2 29 27  GLUCOSE 242* 222*  BUN 15 13  CREATININE 1.21 1.09  CALCIUM 8.0* 8.2*    PT/INR: No results for input(s): LABPROT, INR in the last 72 hours. ABG    Component Value Date/Time   PHART 7.475 (H) 12/06/2017 1730   HCO3 27.4 12/06/2017 1730   TCO2 32 03/20/2015 1638   O2SAT 98.1 12/06/2017 1730   CBG (last 3)  Recent Labs    01/01/18 1601 01/01/18 2049 01/02/18 0530  GLUCAP 229* 239* 225*    Meds Scheduled Meds: . amiodarone  200 mg Oral Daily  . bisoprolol  2.5 mg Oral Daily  . DULoxetine  30 mg Oral Daily  . insulin aspart  0-5 Units Subcutaneous QHS  . insulin aspart  0-9 Units Subcutaneous TID WC  . insulin glargine  10 Units Subcutaneous QHS  . mouth rinse  15 mL Mouth Rinse  BID  . mupirocin ointment   Nasal BID  . pantoprazole  40 mg Oral Daily  . pravastatin  40 mg Oral q1800  . senna-docusate  1 tablet Oral BID   Continuous Infusions: . sodium chloride 10 mL/hr at 12/31/17 0600  .  ceFAZolin (ANCEF) IV Stopped (01/02/18 0341)  . heparin 1,000 Units/hr (01/02/18 0414)   PRN Meds:.acetaminophen **OR** acetaminophen, HYDROcodone-acetaminophen, methocarbamol, MUSCLE RUB, nitroGLYCERIN, ondansetron **OR** ondansetron (ZOFRAN) IV, polyethylene glycol  Xrays No results found.  Assessment/Plan: S/P Procedure(s) (LRB): IRRIGATION AND DEBRIDEMENT ABDOMINAL WALL ABSCESS (Left) APPLICATION OF WOUND VAC (Left)   1 doing well with wound vac 2 conts current dressing change schedule 3 rhythm management plans as outlined    LOS: 7 days    [name] 01/02/2018   patient examined and medical record reviewed,agree with above note. Tharon Aquas Trigt III 01/02/2018

## 2018-01-02 NOTE — Progress Notes (Signed)
PROGRESS NOTE  Victor [name] CHE:[id number] DOB: 1936/03/20 DOA: 12/26/2017 PCP: Cyndi Bender, PA-C  HPI/Recap of past 24 hours:  Victor [name] is a 82 y.o. year old male with medical history significant for atrial fibrillation Victor [name], CAD s/p CABG ( 1990), chronic combined systolic/diastolic CHF s/p ICD, history of VT ( 2012), T2DM, CKD Stage3, HTN, HLD who presented on 12/26/2017 with subjective fevers, chills , and increased pustular drainage, redness an pain of recently drained hematoma and was found to have left rectus sheath abdominal wall abscess. Of note patient was previous admitted from 1/13-1/25 for an infected abdominal wall hematoma.  There is initial concern at that time for potential infection however patient remained clinically stable with no signs or symptoms of infection.  Interestingly, superficial wound cultures of hematoma 1/21 grew MSSA.  Patient was discharged after Irrigation and debridement of hematoma with JP drain until 1/25.  Patient was not discharged on antibiotics as it was felt to be an uninfected hematoma around epicardial wires from an abandoned epicardial system without antibiotics.    Patient underwent incision and drainage with irrigation of left upper quadrant abdominal of old AICD pocket and placement of wound VAC during last hospitaliztion.  Since admission he was empirically treated with vancomycin and Zosyn.  Underwent I&D on 2/8 wound VAC placement by CT surgery vancomycin was discontinued on 2/9 due to kidney insufficiency.  Zosyn was switched to cefazolin on 2/10 based on intraoperative sensitivities showing MSSA.  Infectious disease consulted on 2/10 for guidance and antibiotic duration.  ID recommended consulting cardiology for possible TEE to evaluate leads in nature no associated vegetations/rule out endovascular infection related to the old device   Subjective No acute events overnight.  No complaints at this time.  Denies any cough, shortness of  breath, chest pain, fevers or chills   Assessment/Plan: Principal Problem:   Abdominal wall abscess Active Problems:   Insulin dependent type 2 diabetes mellitus, controlled (HCC)   Essential hypertension   GERD (gastroesophageal reflux disease)   Hx of CABG 1990   OSA (obstructive sleep apnea)   Stage 3 chronic kidney disease (HCC)   AKI (acute kidney injury) (HCC)   Atrial fibrillation, persistent (HCC)   COPD (chronic obstructive pulmonary disease) (HCC)   Normocytic anemia   Infected defibrillator (HCC)   MSSA (methicillin susceptible Staphylococcus aureus) infection  MSSA Left rectus sheath abdominal wall abscess related to old epicardial leads status post I&D on 2/8 and wound VAC placement, stable Remains stable, no signs of Sepsis. No bacteremia On cefazolin given MSSA on intraoperative and superficial wound cultures Blood cultures have remained negative -Appreciate ID recommendations, continue cefazolin,  -Awaiting cardiology recs for potential TTE given wires still present from abandoned system -Wound vac in place and functional, CTS following. Continue to monitor.    AKI on CKD Stage 3, resolved Creatinine back at baseline.  Peak of 2.3 ( up from baseline of 1.4-1.7)  Suspect related to vancomycin toxicity (Trough on 2/8:17), empiric has since been d/c'd -Monitor BMP -Avoid nephrotoxins  Atrial Fibrillation, rate controlled New onset from previous hospitalization on 11/2017. The previous cardiology plan was for DCCV in 3-4 weeks as an outpatient.  -Continue bisoprolol and Amiodarone -Holding home [name], Resumed heparin per pharmacy protocol  -Closely monitor CBC in light of recent surgery while on heparin gtt  Acute on Chronic Anemia, worsened due to blood loss from abdominal hematoma Remote iron panel shows iron deficiency anemia Baseline hgb 9-10 Presented with infected hematoma  s/p ID so some blood loss since hospitalization Hgb currently stable at7.8 Type  and screen -Continue to monitor cbc, transfuse for goal hgb > 7  Combined systolic/diastolic CHF (0/27/25 TTE: EF 30-35%), stable Worsening crackles now to midlung though patient still on home oxygen regimen Only trace peripheral edema Have been holding home Lasix since admission Some crackles at base of lungs, trace pitting edema. Back on home regimen of 3 L [place]. -Start Lasix 20 mg daily (home regimen: 40 mg twice daily) --Daily weights, low sodium diet  COPD on home oxygen 3L, stable No wheezing or respiratory distress.   Currently back on home oxygen regimen   T2DM, controlled, A1c 8.6(12/2017) Range 165-225 in last 24 hours Lantus to 10 units, monitor blood glucose, sliding scale  HLD, stable Continue home statin  GERD, stable Continue protonix  Code Status: DNR  Family Communication: No family at bedside  Disposition Plan: Cefazolin ID consult to determine duration, awaiting cardiology recommendations, CTS following   Consultants:  CT surgery  Procedures: 2/10: Incision and drainage with irrigation of abdominal wound old AICD pocket, placement of wound VAC  Antimicrobials:  IV vancomycin 2/5--2/8  IV Zosyn 2/5--2/9  Cefazolin 2/10>>>    Cultures:  Superficial culture 12/27/17: MSSA(pan sensitive)  DVT prophylaxis: Heparin gtt  Objective: Vitals:   01/01/18 1203 01/01/18 1929 01/02/18 0455 01/02/18 0801  BP: (!) 151/57 (!) 145/71 121/73 (!) 146/76  Pulse: 66 70 63 97  Resp: 15 11 14 18   Temp:  (!) 97.5 F (36.4 C) 97.7 F (36.5 C) 97.8 F (36.6 C)  TempSrc: Oral Oral Oral Oral  SpO2: 98% 98% 94% 98%  Weight:      Height:        Intake/Output Summary (Last 24 hours) at 01/02/2018 1314 Last data filed at 01/02/2018 0944 Gross per 24 hour  Intake 2020 ml  Output 650 ml  Net 1370 ml   Filed Weights   12/26/17 1206 12/29/17 1637 12/29/17 2011  Weight: 68.5 kg (151 lb) 68.5 kg (151 lb) 99.1 kg (218 lb 7.6 oz)    Exam:  Gen. Alert, lying in  bed, no distress Resp. 3 L [place],  crackles at bases and mid lung, no wheezing , no distress CV. Irregularly irregular rhythm, normal rate, SEM 3/6 loudest in LUSB GI. Soft abdomen, wound vac in place, draining appropriately in cannister  Extremities. Trace pitting edema bilaterally  Data Reviewed: CBC: Recent Labs  Lab 12/29/17 0707 12/30/17 0151 12/31/17 0333 01/01/18 0219 01/02/18 0226  WBC 6.8 6.1 8.3 6.3 7.5  NEUTROABS 4.7  --   --   --   --   HGB 9.4* 9.3* 8.4* 7.8* 7.8*  HCT 30.7* 30.7* 27.4* 26.1* 26.4*  MCV 82.5 83.4 82.5 83.9 84.6  PLT 188 194 208 168 366   Basic Metabolic Panel: Recent Labs  Lab 12/29/17 1554 12/30/17 0151 12/31/17 0333 01/01/18 1424 01/02/18 0226  NA 139 136 138 138 141  K 3.6 4.3 4.2 3.8 4.0  CL 96* 96* 97* 99* 102  CO2 28 26 31 29 27   GLUCOSE 94 330* 177* 242* 222*  BUN 20 22* 28* 15 13  CREATININE 1.97* 2.01* 1.64* 1.21 1.09  CALCIUM 8.3* 7.9* 8.1* 8.0* 8.2*   GFR: Estimated Creatinine Clearance: 61.6 mL/min (by C-G formula based on SCr of 1.09 mg/dL). Liver Function Tests: Recent Labs  Lab 12/28/17 0215 12/28/17 0749  AST 22 21  ALT 13* 13*  ALKPHOS 93 87  BILITOT 0.6 0.6  PROT 5.6* 5.6*  ALBUMIN 2.1* 2.2*   No results for input(s): LIPASE, AMYLASE in the last 168 hours. No results for input(s): AMMONIA in the last 168 hours. Coagulation Profile: Recent Labs  Lab 12/28/17 0215 12/28/17 0749  INR 2.03 1.94   Cardiac Enzymes: No results for input(s): CKTOTAL, CKMB, CKMBINDEX, TROPONINI in the last 168 hours. BNP (last 3 results) No results for input(s): PROBNP in the last 8760 hours. HbA1C: No results for input(s): HGBA1C in the last 72 hours. CBG: Recent Labs  Lab 01/01/18 1106 01/01/18 1601 01/01/18 2049 01/02/18 0530 01/02/18 1122  GLUCAP 189* 229* 239* 225* 165*   Lipid Profile: No results for input(s): CHOL, HDL, LDLCALC, TRIG, CHOLHDL, LDLDIRECT in the last 72 hours. Thyroid Function Tests: No results  for input(s): TSH, T4TOTAL, FREET4, T3FREE, THYROIDAB in the last 72 hours. Anemia Panel: No results for input(s): VITAMINB12, FOLATE, FERRITIN, TIBC, IRON, RETICCTPCT in the last 72 hours. Urine analysis:    Component Value Date/Time   COLORURINE YELLOW 12/28/2017 0016   APPEARANCEUR CLOUDY (A) 12/28/2017 0016   LABSPEC 1.026 12/28/2017 0016   PHURINE 5.0 12/28/2017 0016   GLUCOSEU NEGATIVE 12/28/2017 0016   HGBUR NEGATIVE 12/28/2017 0016   BILIRUBINUR NEGATIVE 12/28/2017 0016   KETONESUR NEGATIVE 12/28/2017 0016   PROTEINUR NEGATIVE 12/28/2017 0016   NITRITE NEGATIVE 12/28/2017 0016   LEUKOCYTESUR NEGATIVE 12/28/2017 0016   Sepsis Labs: @LABRCNTIP (procalcitonin:4,lacticidven:4)  ) Recent Results (from the past 240 hour(s))  Culture, blood (routine x 2)     Status: None   Collection Time: 12/26/17 12:50 PM  Result Value Ref Range Status   Specimen Description LEFT ANTECUBITAL  Final   Special Requests   Final    BOTTLES DRAWN AEROBIC AND ANAEROBIC Blood Culture adequate volume   Culture   Final    NO GROWTH 5 DAYS Performed at [facility], [place] [address], [place], [place] [address]    Report Status 12/31/2017 FINAL  Final  Culture, blood (routine x 2)     Status: None   Collection Time: 12/26/17  1:00 PM  Result Value Ref Range Status   Specimen Description BLOOD RIGHT ARM  Final   Special Requests   Final    BOTTLES DRAWN AEROBIC AND ANAEROBIC Blood Culture adequate volume   Culture   Final    NO GROWTH 5 DAYS Performed at [facility], [place] [address] [place], [place] [address]    Report Status 12/31/2017 FINAL  Final  Aerobic Culture (superficial specimen)     Status: None   Collection Time: 12/27/17 10:41 AM  Result Value Ref Range Status   Specimen Description ABSCESS ABDOMEN LEFT LOWER  Final   Special Requests NONE  Final   Gram Stain   Final    FEW WBC PRESENT, PREDOMINANTLY PMN FEW GRAM POSITIVE COCCI IN PAIRS Performed at [facility], [place] [address], [place],  [address]    Culture MODERATE STAPHYLOCOCCUS AUREUS  Final   Report Status 12/29/2017 FINAL  Final   Organism ID, Bacteria STAPHYLOCOCCUS AUREUS  Final      Susceptibility   Staphylococcus aureus - MIC*    CIPROFLOXACIN <=0.5 SENSITIVE Sensitive     ERYTHROMYCIN <=0.25 SENSITIVE Sensitive     GENTAMICIN <=0.5 SENSITIVE Sensitive     OXACILLIN <=0.25 SENSITIVE Sensitive     TETRACYCLINE <=1 SENSITIVE Sensitive     VANCOMYCIN 1 SENSITIVE Sensitive     TRIMETH/SULFA <=10 SENSITIVE Sensitive  CLINDAMYCIN <=0.25 SENSITIVE Sensitive     RIFAMPIN <=0.5 SENSITIVE Sensitive     Inducible Clindamycin NEGATIVE Sensitive     * MODERATE STAPHYLOCOCCUS AUREUS  Surgical pcr screen     Status: Abnormal   Collection Time: 12/27/17  8:59 PM  Result Value Ref Range Status   MRSA, PCR NEGATIVE NEGATIVE Final   Staphylococcus aureus POSITIVE (A) NEGATIVE Final    Comment: (NOTE) The Xpert SA Assay (FDA approved for NASAL specimens in patients 43 years of age and older), is one component of a comprehensive surveillance program. It is not intended to diagnose infection nor to guide or monitor treatment. Performed at [facility], [place] [address] [place], [place] [address]   Aerobic/Anaerobic Culture (surgical/deep wound)     Status: None (Preliminary result)   Collection Time: 12/29/17  6:11 PM  Result Value Ref Range Status   Specimen Description FLUID LEFT LOWER ABDOMEN  Final   Special Requests PATIENT ON FOLLOWING VANC AND ZOSYN  Final   Gram Stain   Final    MODERATE WBC PRESENT, PREDOMINANTLY PMN NO ORGANISMS SEEN Performed at [facility], [place] [address] [place], [place] [address]    Culture   Final    FEW STAPHYLOCOCCUS AUREUS NO ANAEROBES ISOLATED; CULTURE IN PROGRESS FOR 5 DAYS    Report Status PENDING  Incomplete   Organism ID, Bacteria STAPHYLOCOCCUS AUREUS  Final      Susceptibility   Staphylococcus aureus - MIC*     CIPROFLOXACIN <=0.5 SENSITIVE Sensitive     ERYTHROMYCIN <=0.25 SENSITIVE Sensitive     GENTAMICIN <=0.5 SENSITIVE Sensitive     OXACILLIN <=0.25 SENSITIVE Sensitive     TETRACYCLINE <=1 SENSITIVE Sensitive     VANCOMYCIN <=0.5 SENSITIVE Sensitive     TRIMETH/SULFA <=10 SENSITIVE Sensitive     CLINDAMYCIN <=0.25 SENSITIVE Sensitive     RIFAMPIN <=0.5 SENSITIVE Sensitive     Inducible Clindamycin NEGATIVE Sensitive     * FEW STAPHYLOCOCCUS AUREUS      Studies: No results found.  Scheduled Meds: . amiodarone  200 mg Oral Daily  . bisoprolol  2.5 mg Oral Daily  . DULoxetine  30 mg Oral Daily  . insulin aspart  0-5 Units Subcutaneous QHS  . insulin aspart  0-9 Units Subcutaneous TID WC  . insulin glargine  10 Units Subcutaneous QHS  . mouth rinse  15 mL Mouth Rinse BID  . mupirocin ointment   Nasal BID  . pantoprazole  40 mg Oral Daily  . pravastatin  40 mg Oral q1800  . senna-docusate  1 tablet Oral BID    Continuous Infusions: . sodium chloride 10 mL/hr at 12/31/17 0600  .  ceFAZolin (ANCEF) IV 2 g (01/02/18 1147)  . heparin 1,050 Units/hr (01/02/18 1210)     LOS: 7 days     Desiree Hane, MD [facility] Pager [phone]  If 7PM-7AM, please contact night-coverage [email or web address] Password [email or web address] 01/02/2018, 1:14 PM

## 2018-01-02 NOTE — Progress Notes (Signed)
CSW met patient and grandson Josh at bedside to discuss further the possibility of going to SNF. Patients grandson [name] stated that due to his work schedule he will be unable to give patient the care he needs and patient lives alone and is not safe to be by himself. CSW went over the process of SNF with family and patient stated he will "try it". CSW gave Josh a list of facility that has made a bed offer. Josh stated he will reach out to CSW with a decision.   Rhea Pink, MSW,  [place]

## 2018-01-02 NOTE — Progress Notes (Signed)
[facility] Coordinator will follow pt with the ID team to support pharmacy services for home IV ABX at DC as ordered.  If patient discharges after hours, please call [phone].   Larry Sierras 01/02/2018, 5:38 PM

## 2018-01-02 NOTE — Progress Notes (Signed)
ANTICOAGULATION CONSULT NOTE - Follow Up Consult  Pharmacy Consult for Heparin Indication: atrial fibrillation  Allergies  Allergen Reactions  . Coreg [Carvedilol] Other (See Comments)    Shortness of breath ,fatigue ,dizzyness   . Lovastatin Other (See Comments)    CK elevation, Myalgias  . Ativan [Lorazepam] Other (See Comments)    Pt. had side effects    Patient Measurements: Height: 5\' 7"  (170.2 cm) Weight: 218 lb 7.6 oz (99.1 kg) IBW/kg (Calculated) : 66.1  Heparin Dosing Weight: 68 kg  Vital Signs: Temp: 97.8 F (36.6 C) (02/12 0801) Temp Source: Oral (02/12 0801) BP: 146/76 (02/12 0801) Pulse Rate: 97 (02/12 0801)  Labs: Recent Labs    12/31/17 0333 01/01/18 0219 01/01/18 1238 01/01/18 1424 01/02/18 0226 01/02/18 1108  HGB 8.4* 7.8*  --   --  7.8*  --   HCT 27.4* 26.1*  --   --  26.4*  --   PLT 208 168  --   --  168  --   HEPARINUNFRC  --   --  0.41  --  0.17* 0.30  CREATININE 1.64*  --   --  1.21 1.09  --     Estimated Creatinine Clearance: 61.6 mL/min (by C-G formula based on SCr of 1.09 mg/dL).  Assessment: 30 YOM with recently diagnosis AFib who was started on Eliquis in January. Eliquis held for I&D for abdominal wall abscess on 12/29/17. Last Eliquis dose 2/4 prior to admission. Heparin was resumed post-op 2/11 AM per CVTS. Heparin level therapeutic but at bottom of range at 0.3. CBC stable. No bleeding or issues with the drip per discussion with RN.  Goal of Therapy:  Heparin level 0.3-0.7 units/ml Monitor platelets by anticoagulation protocol: Yes   Plan:  Increase IV heparin slightly to 1050 units/hr to ensure stays in range Monitor daily heparin level and CBC, s/sx bleeding Off Eliquis since admitted 2/5   Elicia Lamp, PharmD, BCPS Clinical Pharmacist Clinical phone for 01/02/2018 until 3:30pm: [phone] If after 3:30pm, please call main pharmacy at: [phone] 01/02/2018 12:06 PM

## 2018-01-02 NOTE — Plan of Care (Signed)
Spoke with patient and grandson at 7 pm.  Patient and grandson gave oral consent for TEE.    [facility]

## 2018-01-03 ENCOUNTER — Ambulatory Visit: Payer: Self-pay | Admitting: Cardiothoracic Surgery

## 2018-01-03 ENCOUNTER — Inpatient Hospital Stay (HOSPITAL_COMMUNITY): Payer: Medicare Other | Admitting: Anesthesiology

## 2018-01-03 ENCOUNTER — Inpatient Hospital Stay (HOSPITAL_COMMUNITY): Payer: Medicare Other

## 2018-01-03 ENCOUNTER — Encounter (HOSPITAL_COMMUNITY)
Admission: EM | Disposition: A | Discharge status: Skilled Nursing Facility | Payer: Self-pay | Source: Home / Self Care | Attending: Internal Medicine

## 2018-01-03 ENCOUNTER — Encounter (HOSPITAL_COMMUNITY): Payer: Self-pay

## 2018-01-03 DIAGNOSIS — Z95810 Presence of automatic (implantable) cardiac defibrillator: Secondary | ICD-10-CM

## 2018-01-03 DIAGNOSIS — I340 Nonrheumatic mitral (valve) insufficiency: Secondary | ICD-10-CM

## 2018-01-03 HISTORY — PX: TEE WITHOUT CARDIOVERSION: SHX5443

## 2018-01-03 LAB — AEROBIC/ANAEROBIC CULTURE W GRAM STAIN (SURGICAL/DEEP WOUND)

## 2018-01-03 LAB — GLUCOSE, CAPILLARY
Glucose-Capillary: 135 mg/dL — ABNORMAL HIGH (ref 65–99)
Glucose-Capillary: 112 mg/dL — ABNORMAL HIGH (ref 65–99)
Glucose-Capillary: 144 mg/dL — ABNORMAL HIGH (ref 65–99)
Glucose-Capillary: 221 mg/dL — ABNORMAL HIGH (ref 65–99)

## 2018-01-03 LAB — BASIC METABOLIC PANEL
Anion gap: 11 (ref 5–15)
BUN: 9 mg/dL (ref 6–20)
CHLORIDE: 102 mmol/L (ref 101–111)
CO2: 25 mmol/L (ref 22–32)
Calcium: 8.2 mg/dL — ABNORMAL LOW (ref 8.9–10.3)
Creatinine, Ser: 1.01 mg/dL (ref 0.61–1.24)
GFR calc Af Amer: >60 mL/min (ref >60)
GFR calc non Af Amer: >60 mL/min (ref >60)
GLUCOSE: 174 mg/dL — AB (ref 65–99)
Potassium: 3.9 mmol/L (ref 3.5–5.1)
Sodium: 138 mmol/L (ref 135–145)

## 2018-01-03 LAB — CBC
HCT: 28.3 % — ABNORMAL LOW (ref 39.0–52.0)
Hemoglobin: 8.5 g/dL — ABNORMAL LOW (ref 13.0–17.0)
MCH: 25.5 pg — ABNORMAL LOW (ref 26.0–34.0)
MCHC: 30 g/dL (ref 30.0–36.0)
MCV: 85 fL (ref 78.0–100.0)
Platelets: 175 10*3/uL (ref 150–400)
RBC: 3.33 MIL/uL — ABNORMAL LOW (ref 4.22–5.81)
RDW: 15.1 % (ref 11.5–15.5)
WBC: 8.9 10*3/uL (ref 4.0–10.5)

## 2018-01-03 LAB — HCV COMMENT:

## 2018-01-03 LAB — HEPATITIS C ANTIBODY (REFLEX): HCV Ab: <0.1 s/co ratio (ref 0.0–0.9)

## 2018-01-03 LAB — HEPATITIS B SURFACE ANTIGEN: Hepatitis B Surface Ag: NEGATIVE

## 2018-01-03 LAB — HEPARIN LEVEL (UNFRACTIONATED): Heparin Unfractionated: 0.46 IU/mL (ref 0.30–0.70)

## 2018-01-03 SURGERY — ECHOCARDIOGRAM, TRANSESOPHAGEAL
Anesthesia: Monitor Anesthesia Care

## 2018-01-03 MED ORDER — PROPOFOL 10 MG/ML IV BOLUS
INTRAVENOUS | Status: DC | PRN
  Administered 2018-01-03: 20 mg via INTRAVENOUS

## 2018-01-03 MED ORDER — SODIUM CHLORIDE 0.9 % IV SOLN: INTRAVENOUS | Status: DC

## 2018-01-03 MED ORDER — LIDOCAINE HCL (CARDIAC) 20 MG/ML IV SOLN
INTRAVENOUS | Status: DC | PRN
  Administered 2018-01-03: 50 mg via INTRATRACHEAL

## 2018-01-03 MED ORDER — FENTANYL CITRATE (PF) 100 MCG/2ML IJ SOLN: 25.0000 ug | INTRAMUSCULAR | Status: DC | PRN

## 2018-01-03 MED ORDER — PROPOFOL 500 MG/50ML IV EMUL
INTRAVENOUS | Status: DC | PRN
  Administered 2018-01-03: 50 ug/kg/min via INTRAVENOUS

## 2018-01-03 MED ORDER — MEPERIDINE HCL 100 MG/ML IJ SOLN: 6.2500 mg | INTRAMUSCULAR | Status: DC | PRN

## 2018-01-03 NOTE — Progress Notes (Signed)
PROGRESS NOTE  Victor [name] CWC:[id number] DOB: 1936/08/08 DOA: 12/26/2017 PCP: Cyndi Bender, PA-C  HPI/Recap of past 24 hours:  Victor [name] is a 82 y.o. year old male with medical history significant for atrial fibrillation jon eliquis, CAD s/p CABG ( 1990), chronic combined systolic/diastolic CHF s/p ICD, history of VT ( 2012), T2DM, CKD Stage3, HTN, HLD who presented on 12/26/2017 with subjective fevers, chills , and increased pustular drainage, redness an pain of recently drained hematoma and was found to have left rectus sheath abdominal wall abscess. Of note patient was previous admitted from 1/13-1/25 for an infected abdominal wall hematoma.  There is initial concern at that time for potential infection however patient remained clinically stable with no signs or symptoms of infection.  Interestingly, superficial wound cultures of hematoma 1/21 grew MSSA.  Patient was discharged after Irrigation and debridement of hematoma with JP drain until 1/25.  Patient was not discharged on antibiotics as it was felt to be an uninfected hematoma around epicardial wires from an abandoned epicardial system without antibiotics.    Patient underwent incision and drainage with irrigation of left upper quadrant abdominal of old AICD pocket and placement of wound VAC during last hospitaliztion.  Since admission he was empirically treated with vancomycin and Zosyn.  Underwent I&D on 2/8 wound VAC placement by CT surgery vancomycin was discontinued on 2/9 due to kidney insufficiency.  Zosyn was switched to cefazolin on 2/10 based on intraoperative sensitivities showing MSSA.  Infectious disease consulted on 2/10 for guidance and antibiotic duration.  ID recommended consulting cardiology for possible TEE to evaluate leads in nature no associated vegetations/rule out endovascular infection related to the old device   Subjective Report mild pain, abdomen.  Denies chest pain    Assessment/Plan: Principal  Problem:   Abdominal wall abscess Active Problems:   Insulin dependent type 2 diabetes mellitus, controlled (HCC)   Essential hypertension   GERD (gastroesophageal reflux disease)   Hx of CABG 1990   OSA (obstructive sleep apnea)   Stage 3 chronic kidney disease (HCC)   AKI (acute kidney injury) (HCC)   Atrial fibrillation, persistent (HCC)   COPD (chronic obstructive pulmonary disease) (HCC)   Normocytic anemia   Infected defibrillator (HCC)   MSSA (methicillin susceptible Staphylococcus aureus) infection  MSSA Left rectus sheath abdominal wall abscess related to old epicardial leads status post I&D on 2/8 and wound VAC placement, stable Remains stable, no signs of Sepsis. No bacteremia On cefazolin given MSSA on intraoperative and superficial wound cultures Blood cultures have remained negative -Appreciate ID recommendations, continue cefazolin, needs 2 weeks of antibiotics.  -Wound vac in place and functional, CTS following. Continue to monitor.   -TEE negative for pacer wires with no thrombus or vegetation.   AKI on CKD Stage 3, resolved Creatinine back at baseline.  Peak of 2.3 ( up from baseline of 1.4-1.7)  Suspect related to vancomycin toxicity (Trough on 2/8:17), empiric has since been d/c'd -Monitor BMP -Avoid nephrotoxins  Atrial Fibrillation, rate controlled New onset from previous hospitalization on 11/2017. The previous cardiology plan was for DCCV in 3-4 weeks as an outpatient.  -Continue bisoprolol and Amiodarone -Holding home eliquis, on heparin.  -await CVTS recommendation to resume oral anticoagulation.   Acute on Chronic Anemia, worsened due to blood loss from abdominal hematoma Remote iron panel shows iron deficiency anemia Baseline hgb 9-10 Presented with infected hematoma s/p ID so some blood loss since hospitalization Hb at 8.5. Monitor    Combined systolic/diastolic  CHF (12/07/17 TTE: EF 30-35%), stable -Started  Lasix 20 mg daily (home regimen: 40  mg twice daily) --monitor renal function.   COPD on home oxygen 3L, stable No wheezing or respiratory distress.   Currently back on home oxygen regimen   T2DM, controlled, A1c 8.6(12/2017) Range 165-225 in last 24 hours Lantus to 10 units, monitor blood glucose, sliding scale  HLD, stable Continue home statin  GERD, stable Continue protonix  Code Status: DNR  Family Communication: No family at bedside  Disposition Plan: Cefazolin ID consult to determine duration, awaiting cardiology recommendations, CTS following   Consultants:  CT surgery  Procedures: 2/10: Incision and drainage with irrigation of abdominal wound old AICD pocket, placement of wound VAC  Antimicrobials:  IV vancomycin 2/5--2/8  IV Zosyn 2/5--2/9  Cefazolin 2/10>>>    Cultures:  Superficial culture 12/27/17: MSSA(pan sensitive)  DVT prophylaxis: Heparin gtt  Objective: Vitals:   01/02/18 1525 01/02/18 1930 01/03/18 0446 01/03/18 0759  BP: (!) 154/70 136/63 (!) 133/53 133/65  Pulse:  62 61 85  Resp: 18 18 18 16   Temp:  97.7 F (36.5 C) 97.7 F (36.5 C) 97.8 F (36.6 C)  TempSrc:  Oral Oral Oral  SpO2: 99% 100% 100% 95%  Weight:      Height:        Intake/Output Summary (Last 24 hours) at 01/03/2018 1010 Last data filed at 01/03/2018 0406 Gross per 24 hour  Intake 883.43 ml  Output 425 ml  Net 458.43 ml   Filed Weights   12/26/17 1206 12/29/17 1637 12/29/17 2011  Weight: 68.5 kg (151 lb) 68.5 kg (151 lb) 99.1 kg (218 lb 7.6 oz)    Exam:  Gen. NAD Resp. Normal respiratory effort, few crackles.  CV. IRR GI. Soft, mild tenderness, wound vac in place.  Extremities. Trace pitting edema bilaterally  Data Reviewed: CBC: Recent Labs  Lab 12/29/17 0707 12/30/17 0151 12/31/17 0333 01/01/18 0219 01/02/18 0226 01/03/18 0238  WBC 6.8 6.1 8.3 6.3 7.5 8.9  NEUTROABS 4.7  --   --   --   --   --   HGB 9.4* 9.3* 8.4* 7.8* 7.8* 8.5*  HCT 30.7* 30.7* 27.4* 26.1* 26.4* 28.3*  MCV  82.5 83.4 82.5 83.9 84.6 85.0  PLT 188 194 208 168 168 382   Basic Metabolic Panel: Recent Labs  Lab 12/30/17 0151 12/31/17 0333 01/01/18 1424 01/02/18 0226 01/03/18 0238  NA 136 138 138 141 138  K 4.3 4.2 3.8 4.0 3.9  CL 96* 97* 99* 102 102  CO2 26 31 29 27 25   GLUCOSE 330* 177* 242* 222* 174*  BUN 22* 28* 15 13 9   CREATININE 2.01* 1.64* 1.21 1.09 1.01  CALCIUM 7.9* 8.1* 8.0* 8.2* 8.2*   GFR: Estimated Creatinine Clearance: 66.5 mL/min (by C-G formula based on SCr of 1.01 mg/dL). Liver Function Tests: Recent Labs  Lab 12/28/17 0215 12/28/17 0749  AST 22 21  ALT 13* 13*  ALKPHOS 93 87  BILITOT 0.6 0.6  PROT 5.6* 5.6*  ALBUMIN 2.1* 2.2*   No results for input(s): LIPASE, AMYLASE in the last 168 hours. No results for input(s): AMMONIA in the last 168 hours. Coagulation Profile: Recent Labs  Lab 12/28/17 0215 12/28/17 0749  INR 2.03 1.94   Cardiac Enzymes: No results for input(s): CKTOTAL, CKMB, CKMBINDEX, TROPONINI in the last 168 hours. BNP (last 3 results) No results for input(s): PROBNP in the last 8760 hours. HbA1C: No results for input(s): HGBA1C in the last 72 hours.  CBG: Recent Labs  Lab 01/02/18 0530 01/02/18 1122 01/02/18 1631 01/02/18 2119 01/03/18 0552  GLUCAP 225* 165* 146* 211* 135*   Lipid Profile: No results for input(s): CHOL, HDL, LDLCALC, TRIG, CHOLHDL, LDLDIRECT in the last 72 hours. Thyroid Function Tests: No results for input(s): TSH, T4TOTAL, FREET4, T3FREE, THYROIDAB in the last 72 hours. Anemia Panel: No results for input(s): VITAMINB12, FOLATE, FERRITIN, TIBC, IRON, RETICCTPCT in the last 72 hours. Urine analysis:    Component Value Date/Time   COLORURINE YELLOW 12/28/2017 0016   APPEARANCEUR CLOUDY (A) 12/28/2017 0016   LABSPEC 1.026 12/28/2017 0016   PHURINE 5.0 12/28/2017 0016   GLUCOSEU NEGATIVE 12/28/2017 0016   HGBUR NEGATIVE 12/28/2017 0016   BILIRUBINUR NEGATIVE 12/28/2017 0016   KETONESUR NEGATIVE 12/28/2017  0016   PROTEINUR NEGATIVE 12/28/2017 0016   NITRITE NEGATIVE 12/28/2017 0016   LEUKOCYTESUR NEGATIVE 12/28/2017 0016   Sepsis Labs: @LABRCNTIP (procalcitonin:4,lacticidven:4)  ) Recent Results (from the past 240 hour(s))  Culture, blood (routine x 2)     Status: None   Collection Time: 12/26/17 12:50 PM  Result Value Ref Range Status   Specimen Description LEFT ANTECUBITAL  Final   Special Requests   Final    BOTTLES DRAWN AEROBIC AND ANAEROBIC Blood Culture adequate volume   Culture   Final    NO GROWTH 5 DAYS Performed at [facility], [place] [address] [place], [place] [address]    Report Status 12/31/2017 FINAL  Final  Culture, blood (routine x 2)     Status: None   Collection Time: 12/26/17  1:00 PM  Result Value Ref Range Status   Specimen Description BLOOD RIGHT ARM  Final   Special Requests   Final    BOTTLES DRAWN AEROBIC AND ANAEROBIC Blood Culture adequate volume   Culture   Final    NO GROWTH 5 DAYS Performed at [facility], [place] [address] [place], [place] [address]    Report Status 12/31/2017 FINAL  Final  Aerobic Culture (superficial specimen)     Status: None   Collection Time: 12/27/17 10:41 AM  Result Value Ref Range Status   Specimen Description ABSCESS ABDOMEN LEFT LOWER  Final   Special Requests NONE  Final   Gram Stain   Final    FEW WBC PRESENT, PREDOMINANTLY PMN FEW GRAM POSITIVE COCCI IN PAIRS Performed at [facility], [place] [address], [place], [place] [address]    Culture MODERATE STAPHYLOCOCCUS AUREUS  Final   Report Status 12/29/2017 FINAL  Final   Organism ID, Bacteria STAPHYLOCOCCUS AUREUS  Final      Susceptibility   Staphylococcus aureus - MIC*    CIPROFLOXACIN <=0.5 SENSITIVE Sensitive     ERYTHROMYCIN <=0.25 SENSITIVE Sensitive     GENTAMICIN <=0.5 SENSITIVE Sensitive     OXACILLIN <=0.25 SENSITIVE Sensitive     TETRACYCLINE <=1 SENSITIVE Sensitive     VANCOMYCIN 1 SENSITIVE Sensitive     TRIMETH/SULFA <=10  SENSITIVE Sensitive     CLINDAMYCIN <=0.25 SENSITIVE Sensitive     RIFAMPIN <=0.5 SENSITIVE Sensitive     Inducible Clindamycin NEGATIVE Sensitive     * MODERATE STAPHYLOCOCCUS AUREUS  Surgical pcr screen     Status: Abnormal   Collection Time: 12/27/17  8:59 PM  Result Value Ref Range Status   MRSA, PCR NEGATIVE NEGATIVE Final   Staphylococcus aureus POSITIVE (A) NEGATIVE Final    Comment: (NOTE) The Xpert SA Assay (FDA approved for NASAL specimens in patients 22 years of  age and older), is one component of a comprehensive surveillance program. It is not intended to diagnose infection nor to guide or monitor treatment. Performed at [facility], [place] [address], [place], [place] [address]   Aerobic/Anaerobic Culture (surgical/deep wound)     Status: None (Preliminary result)   Collection Time: 12/29/17  6:11 PM  Result Value Ref Range Status   Specimen Description FLUID LEFT LOWER ABDOMEN  Final   Special Requests PATIENT ON FOLLOWING VANC AND ZOSYN  Final   Gram Stain   Final    MODERATE WBC PRESENT, PREDOMINANTLY PMN NO ORGANISMS SEEN Performed at [facility], [place] [address], [place],  [address]    Culture   Final    FEW STAPHYLOCOCCUS AUREUS NO ANAEROBES ISOLATED; CULTURE IN PROGRESS FOR 5 DAYS    Report Status PENDING  Incomplete   Organism ID, Bacteria STAPHYLOCOCCUS AUREUS  Final      Susceptibility   Staphylococcus aureus - MIC*    CIPROFLOXACIN <=0.5 SENSITIVE Sensitive     ERYTHROMYCIN <=0.25 SENSITIVE Sensitive     GENTAMICIN <=0.5 SENSITIVE Sensitive     OXACILLIN <=0.25 SENSITIVE Sensitive     TETRACYCLINE <=1 SENSITIVE Sensitive     VANCOMYCIN <=0.5 SENSITIVE Sensitive     TRIMETH/SULFA <=10 SENSITIVE Sensitive     CLINDAMYCIN <=0.25 SENSITIVE Sensitive     RIFAMPIN <=0.5 SENSITIVE Sensitive     Inducible Clindamycin NEGATIVE Sensitive     * FEW STAPHYLOCOCCUS AUREUS      Studies: No results found.  Scheduled Meds: . amiodarone   200 mg Oral Daily  . bisoprolol  2.5 mg Oral Daily  . DULoxetine  30 mg Oral Daily  . insulin aspart  0-5 Units Subcutaneous QHS  . insulin aspart  0-9 Units Subcutaneous TID WC  . insulin glargine  10 Units Subcutaneous QHS  . mouth rinse  15 mL Mouth Rinse BID  . mupirocin ointment   Nasal BID  . pantoprazole  40 mg Oral Daily  . pravastatin  40 mg Oral q1800  . senna-docusate  1 tablet Oral BID    Continuous Infusions: . sodium chloride 10 mL/hr at 12/31/17 0600  . sodium chloride 20 mL/hr at 01/03/18 0235  .  ceFAZolin (ANCEF) IV Stopped (01/03/18 0514)  . heparin 1,050 Units/hr (01/02/18 1325)     LOS: 8 days     Elmarie Shiley, MD [facility] Pager [phone]  If 7PM-7AM, please contact night-coverage [email or web address] Password [email or web address] 01/03/2018, 10:10 AM

## 2018-01-03 NOTE — CV Procedure (Signed)
    PROCEDURE NOTE:  Procedure:  Transesophageal echocardiogram Operator:  [name], MD Indications:  Infected epicardial leads Complications: None  During this procedure the patient is administered a total of Propofol 109 mg to achieve and maintain moderate conscious sedation.  The patient's heart rate, blood pressure, and oxygen saturation are monitored continuously during the procedure by anesthesia.   Results: Normal LV size with moderate to severely reduced LVF with EF 30%.   Normal RV size and function Mildly dilated RA Normal LA and large LA appendage with no evidence of thrombus Normal TV with moderate TR Normal PV with trivial PR Mildly thickened MV leaflets with mild to moderate central MR secondary to dilated MV annulus, MAC Normal trileaflet AV Hypermobile interatrial septum with no evidence of shunt by colorflow dopper  Pacer wires visualized in RA and RV with no evidence of thrombus or vegetation. Thoracic aorta not adequately visualized  The patient tolerated the procedure well and was transferred back to their room in stable condition.  Signed: [name], MD [facility]

## 2018-01-03 NOTE — Progress Notes (Signed)
PT Cancellation Note  Patient Details Name: CORDERIUS SARACENI MRN: [id number] DOB: 1936-09-02   Cancelled Treatment:    Reason Eval/Treat Not Completed: Patient declined, no reason specified Performed 2 separate attempts to work with patient today. On first attempt, patient refuses PT due to not having eaten/wanting to eat prior to PT; on second attempt, patient appears to have just gotten tray and is working on eating. Plan to attempt back if/as schedule allows, otherwise will triage patient to next day of service.    Deniece Ree PT, DPT, CBIS  Supplemental Physical Therapist [facility]   Pager [phone]

## 2018-01-03 NOTE — Progress Notes (Signed)
ANTICOAGULATION CONSULT NOTE - Follow Up Consult  Pharmacy Consult for Heparin Indication: atrial fibrillation  Allergies  Allergen Reactions  . Coreg [Carvedilol] Other (See Comments)    Shortness of breath ,fatigue ,dizzyness   . Lovastatin Other (See Comments)    CK elevation, Myalgias  . Ativan [Lorazepam] Other (See Comments)    Pt. had side effects    Patient Measurements: Height: 5\' 7"  (170.2 cm) Weight: 218 lb 7.6 oz (99.1 kg) IBW/kg (Calculated) : 66.1  Heparin Dosing Weight: 68 kg  Vital Signs: Temp: 97.8 F (36.6 C) (02/13 0759) Temp Source: Oral (02/13 0759) BP: 133/65 (02/13 0759) Pulse Rate: 85 (02/13 0759)  Labs: Recent Labs    01/01/18 0219  01/01/18 1424 01/02/18 0226 01/02/18 1108 01/03/18 0238  HGB 7.8*  --   --  7.8*  --  8.5*  HCT 26.1*  --   --  26.4*  --  28.3*  PLT 168  --   --  168  --  175  HEPARINUNFRC  --    < >  --  0.17* 0.30 0.46  CREATININE  --   --  1.21 1.09  --  1.01   < > = values in this interval not displayed.    Estimated Creatinine Clearance: 66.5 mL/min (by C-G formula based on SCr of 1.01 mg/dL).  Assessment: 63 YOM with recently diagnosis AFib who was started on Eliquis in January. Eliquis held for I&D for abdominal wall abscess on 12/29/17. Last Eliquis dose 2/4 prior to admission. Heparin was resumed post-op 2/11 AM per CVTS. Heparin level therapeutic. CBC stable. No bleeding documented.  Goal of Therapy:  Heparin level 0.3-0.7 units/ml Monitor platelets by anticoagulation protocol: Yes   Plan:  Continue IV heparin slightly at 1050 units/hr Monitor daily heparin level and CBC, s/sx bleeding Off Eliquis since admitted 2/5   Elicia Lamp, PharmD, BCPS Clinical Pharmacist Clinical phone for 01/03/2018 until 3:30pm: [phone] If after 3:30pm, please call main pharmacy at: [phone] 01/03/2018 8:55 AM

## 2018-01-03 NOTE — Anesthesia Postprocedure Evaluation (Signed)
Anesthesia Post Note  Patient: Victor [name]  Procedure(s) Performed: TRANSESOPHAGEAL ECHOCARDIOGRAM (TEE) (N/A )     Patient location during evaluation: PACU Anesthesia Type: MAC Level of consciousness: awake and alert Pain management: pain level controlled Vital Signs Assessment: post-procedure vital signs reviewed and stable Respiratory status: spontaneous breathing, nonlabored ventilation, respiratory function stable and patient connected to nasal cannula oxygen Cardiovascular status: stable and blood pressure returned to baseline Postop Assessment: no apparent nausea or vomiting Anesthetic complications: no    Last Vitals:  Vitals:   01/03/18 1137 01/03/18 1147  BP: (!) 139/49 (!) 149/48  Pulse: (!) 55 (!) 51  Resp: 18 14  Temp: 36.5 C   SpO2: 100% 100%    Last Pain:  Vitals:   01/03/18 1137  TempSrc: Oral  PainSc:                  Moe Graca

## 2018-01-03 NOTE — Anesthesia Preprocedure Evaluation (Addendum)
Anesthesia Evaluation  Patient identified by MRN, date of birth, ID band Patient awake    Reviewed: Allergy & Precautions, NPO status , Patient's Chart, lab work & pertinent test results  Airway Mallampati: II  TM Distance: >3 FB Neck ROM: Full    Dental no notable dental hx. (+) Poor Dentition, Chipped, Missing, Loose   Pulmonary former smoker,    Pulmonary exam normal breath sounds clear to auscultation       Cardiovascular hypertension, + CAD, + Past MI, + Peripheral Vascular Disease and +CHF  Normal cardiovascular exam+ Cardiac Defibrillator  Rhythm:Irregular Rate:Normal     Neuro/Psych    GI/Hepatic   Endo/Other  diabetes  Renal/GU      Musculoskeletal   Abdominal   Peds  Hematology   Anesthesia Other Findings extremal poor dentition with many broke lower teeth  Reproductive/Obstetrics                            Anesthesia Physical  Anesthesia Plan  ASA: IV  Anesthesia Plan: MAC   Post-op Pain Management:    Induction: Intravenous  PONV Risk Score and Plan: 1 and Treatment may vary due to age or medical condition  Airway Management Planned: Nasal Cannula, Natural Airway and Mask  Additional Equipment:   Intra-op Plan:   Post-operative Plan:   Informed Consent: I have reviewed the patients History and Physical, chart, labs and discussed the procedure including the risks, benefits and alternatives for the proposed anesthesia with the patient or authorized representative who has indicated his/her understanding and acceptance.   Dental advisory given  Plan Discussed with: CRNA and Anesthesiologist  Anesthesia Plan Comments: (IPersistent AFib, on eliquis, Ischemic cardiomyopathy EF 30-35% CAD S/P CABG 1990 Type 2 DM glucose  Renal insufficiency Cr.   )        Anesthesia Quick Evaluation

## 2018-01-03 NOTE — Progress Notes (Addendum)
Pt had 3 beats of V tach on tele with sinus bradycardia in the 50s.  Pt was watching television when observed. Did not complain of any symptoms. Will continue to closely monitor.

## 2018-01-03 NOTE — Progress Notes (Signed)
Subjective: Patient endorses stable pain in his abdomen at site of abscess. He denies fevers or chills.  Antibiotics:  Anti-infectives (From admission, onward)   Start     Dose/Rate Route Frequency Ordered Stop   12/31/17 1200  ceFAZolin (ANCEF) IVPB 2g/100 mL premix     2 g 200 mL/hr over 30 Minutes Intravenous Every 8 hours 12/31/17 1136     12/29/17 1813  vancomycin (VANCOCIN) 1,000 mg in sodium chloride 0.9 % 1,000 mL irrigation  Status:  Discontinued       As needed 12/29/17 1814 12/29/17 1836   12/29/17 1800  vancomycin (VANCOCIN) IVPB 750 mg/150 ml premix  Status:  Discontinued     750 mg 150 mL/hr over 60 Minutes Intravenous Every 24 hours 12/29/17 1733 12/30/17 1244   12/29/17 1730  vancomycin (VANCOCIN) 1,000 mg in sodium chloride 0.9 % 1,000 mL irrigation  Status:  Discontinued      Irrigation  Once 12/29/17 1723 12/29/17 2003   12/29/17 1430  cefUROXime (ZINACEF) 1.5 g in dextrose 5 % 50 mL IVPB     1.5 g 100 mL/hr over 30 Minutes Intravenous 60 min pre-op 12/28/17 0735 12/29/17 1807   12/27/17 1600  vancomycin (VANCOCIN) IVPB 750 mg/150 ml premix  Status:  Discontinued     750 mg 150 mL/hr over 60 Minutes Intravenous Every 24 hours 12/26/17 1537 12/29/17 1116   12/26/17 2200  piperacillin-tazobactam (ZOSYN) IVPB 3.375 g  Status:  Discontinued     3.375 g 12.5 mL/hr over 240 Minutes Intravenous Every 8 hours 12/26/17 1539 12/31/17 1136   12/26/17 1545  vancomycin (VANCOCIN) 1,500 mg in sodium chloride 0.9 % 500 mL IVPB     1,500 mg 250 mL/hr over 120 Minutes Intravenous  Once 12/26/17 1537 12/26/17 1806   12/26/17 1545  piperacillin-tazobactam (ZOSYN) IVPB 3.375 g  Status:  Discontinued     3.375 g 12.5 mL/hr over 240 Minutes Intravenous Every 8 hours 12/26/17 1537 12/26/17 1539   12/26/17 1515  piperacillin-tazobactam (ZOSYN) IVPB 3.375 g  Status:  Discontinued     3.375 g 100 mL/hr over 30 Minutes Intravenous  Once 12/26/17 1512 12/26/17 1604   12/26/17  1515  vancomycin (VANCOCIN) IVPB 1000 mg/200 mL premix  Status:  Discontinued     1,000 mg 200 mL/hr over 60 Minutes Intravenous  Once 12/26/17 1512 12/26/17 1537      Medications: Scheduled Meds: . amiodarone  200 mg Oral Daily  . bisoprolol  2.5 mg Oral Daily  . DULoxetine  30 mg Oral Daily  . insulin aspart  0-5 Units Subcutaneous QHS  . insulin aspart  0-9 Units Subcutaneous TID WC  . insulin glargine  10 Units Subcutaneous QHS  . mouth rinse  15 mL Mouth Rinse BID  . mupirocin ointment   Nasal BID  . pantoprazole  40 mg Oral Daily  . pravastatin  40 mg Oral q1800  . senna-docusate  1 tablet Oral BID   Continuous Infusions: . sodium chloride Stopped (01/03/18 1138)  .  ceFAZolin (ANCEF) IV 2 g (01/03/18 1227)  . heparin 1,050 Units/hr (01/02/18 1325)   PRN Meds:.acetaminophen **OR** acetaminophen, HYDROcodone-acetaminophen, methocarbamol, MUSCLE RUB, nitroGLYCERIN, ondansetron **OR** ondansetron (ZOFRAN) IV, polyethylene glycol  Objective: Weight change:   Intake/Output Summary (Last 24 hours) at 01/03/2018 1253 Last data filed at 01/03/2018 1138 Gross per 24 hour  Intake 1083.43 ml  Output 425 ml  Net 658.43 ml   Blood pressure (!) 136/58, pulse Marland Kitchen)  51, temperature 97.8 F (36.6 C), temperature source Oral, resp. rate 18, height 5\' 7"  (1.702 m), weight 218 lb 7.6 oz (99.1 kg), SpO2 100 %. Temp:  [97.5 F (36.4 C)-98 F (36.7 C)] 97.8 F (36.6 C) (02/13 1204) Pulse Rate:  [51-99] 51 (02/13 1147) Resp:  [14-18] 18 (02/13 1204) BP: (133-174)/(48-70) 136/58 (02/13 1204) SpO2:  [95 %-100 %] 100 % (02/13 1204)  Physical Exam: General: Alert and awake, oriented x3, not in any acute distress. HEENT: anicteric sclera CVS: irregularly irregular rhythm, bradycardia, systolic murmur best heard at LUSB, rubs, or gallops Chest: Decreased breath sounds throughout esp at bases; no increased work of breathing with mild bibasilar rales, no wheezes Abdomen: soft nontender,  nondistended, normal bowel sounds; Wound vac in place in LLQ with surrounding induration and erythema which appears stable from yesterday Extremities: increasing mild bil LE pitting edema L>R Skin: no rashes  CBC: CBC Latest Ref Rng & Units 01/03/2018 01/02/2018 01/01/2018  WBC 4.0 - 10.5 K/uL 8.9 7.5 6.3  Hemoglobin 13.0 - 17.0 g/dL 8.5(L) 7.8(L) 7.8(L)  Hematocrit 39.0 - 52.0 % 28.3(L) 26.4(L) 26.1(L)  Platelets 150 - 400 K/uL 175 168 168    BMET Recent Labs    01/02/18 0226 01/03/18 0238  NA 141 138  K 4.0 3.9  CL 102 102  CO2 27 25  GLUCOSE 222* 174*  BUN 13 9  CREATININE 1.09 1.01  CALCIUM 8.2* 8.2*   Liver Panel  No results for input(s): PROT, ALBUMIN, AST, ALT, ALKPHOS, BILITOT, BILIDIR, IBILI in the last 72 hours.  Sedimentation Rate Recent Labs    01/02/18 0226  ESRSEDRATE 50*   C-Reactive Protein Recent Labs    01/02/18 0226  CRP 1.6*    Micro Results: Recent Results (from the past 720 hour(s))  Body fluid culture     Status: None   Collection Time: 12/05/17  3:35 PM  Result Value Ref Range Status   Specimen Description FLUID ABDOMEN  Final   Special Requests Normal  Final   Gram Stain NO WBC SEEN NO ORGANISMS SEEN   Final   Culture No growth aerobically or anaerobically.  Final   Report Status 12/09/2017 FINAL  Final  Culture, blood (x 2)     Status: None   Collection Time: 12/06/17  8:21 PM  Result Value Ref Range Status   Specimen Description BLOOD HAND LEFT  Final   Special Requests IN PEDIATRIC BOTTLE Blood Culture adequate volume  Final   Culture NO GROWTH 5 DAYS  Final   Report Status 12/11/2017 FINAL  Final  Culture, blood (x 2)     Status: None   Collection Time: 12/06/17  8:22 PM  Result Value Ref Range Status   Specimen Description BLOOD ARM RIGHT  Final   Special Requests IN PEDIATRIC BOTTLE Blood Culture adequate volume  Final   Culture NO GROWTH 5 DAYS  Final   Report Status 12/11/2017 FINAL  Final  MRSA PCR Screening      Status: None   Collection Time: 12/06/17  8:50 PM  Result Value Ref Range Status   MRSA by PCR NEGATIVE NEGATIVE Final    Comment:        The GeneXpert MRSA Assay (FDA approved for NASAL specimens only), is one component of a comprehensive MRSA colonization surveillance program. It is not intended to diagnose MRSA infection nor to guide or monitor treatment for MRSA infections.   Culture, Urine     Status: None   Collection Time:  12/06/17  9:43 PM  Result Value Ref Range Status   Specimen Description URINE, RANDOM  Final   Special Requests NONE  Final   Culture NO GROWTH  Final   Report Status 12/08/2017 FINAL  Final  Anaerobic culture     Status: None   Collection Time: 12/11/17 12:47 PM  Result Value Ref Range Status   Specimen Description WOUND LEFT RECTUS HEMATOMA  Final   Special Requests PATIENT ON FOLLOWING ZINACEF  Final   Culture   Final    RARE STAPHYLOCOCCUS AUREUS NO ANAEROBES ISOLATED    Report Status 12/19/2017 FINAL  Final   Organism ID, Bacteria STAPHYLOCOCCUS AUREUS  Final      Susceptibility   Staphylococcus aureus - MIC*    CIPROFLOXACIN <=0.5 SENSITIVE Sensitive     ERYTHROMYCIN <=0.25 SENSITIVE Sensitive     GENTAMICIN <=0.5 SENSITIVE Sensitive     OXACILLIN <=0.25 SENSITIVE Sensitive     TETRACYCLINE <=1 SENSITIVE Sensitive     VANCOMYCIN 1 SENSITIVE Sensitive     TRIMETH/SULFA <=10 SENSITIVE Sensitive     CLINDAMYCIN <=0.25 SENSITIVE Sensitive     RIFAMPIN <=0.5 SENSITIVE Sensitive     Inducible Clindamycin NEGATIVE Sensitive     * RARE STAPHYLOCOCCUS AUREUS  Aerobic Culture (superficial specimen)     Status: None   Collection Time: 12/11/17 12:47 PM  Result Value Ref Range Status   Specimen Description WOUND LEFT RECTUS HEMATOMA  Final   Special Requests PATIENT ON FOLLOWING ZINACEF  Final   Gram Stain NO WBC SEEN NO ORGANISMS SEEN   Final   Culture NO GROWTH 3 DAYS  Final   Report Status 12/14/2017 FINAL  Final  Culture, blood (routine  x 2)     Status: None   Collection Time: 12/26/17 12:50 PM  Result Value Ref Range Status   Specimen Description LEFT ANTECUBITAL  Final   Special Requests   Final    BOTTLES DRAWN AEROBIC AND ANAEROBIC Blood Culture adequate volume   Culture   Final    NO GROWTH 5 DAYS Performed at [facility], [address] [place], [place] [address]    Report Status 12/31/2017 FINAL  Final  Culture, blood (routine x 2)     Status: None   Collection Time: 12/26/17  1:00 PM  Result Value Ref Range Status   Specimen Description BLOOD RIGHT ARM  Final   Special Requests   Final    BOTTLES DRAWN AEROBIC AND ANAEROBIC Blood Culture adequate volume   Culture   Final    NO GROWTH 5 DAYS Performed at [facility], [place] [address] [place], [place] [address]    Report Status 12/31/2017 FINAL  Final  Aerobic Culture (superficial specimen)     Status: None   Collection Time: 12/27/17 10:41 AM  Result Value Ref Range Status   Specimen Description ABSCESS ABDOMEN LEFT LOWER  Final   Special Requests NONE  Final   Gram Stain   Final    FEW WBC PRESENT, PREDOMINANTLY PMN FEW GRAM POSITIVE COCCI IN PAIRS Performed at [facility], [place] [address] [place], [place] [address]    Culture MODERATE STAPHYLOCOCCUS AUREUS  Final   Report Status 12/29/2017 FINAL  Final   Organism ID, Bacteria STAPHYLOCOCCUS AUREUS  Final      Susceptibility   Staphylococcus aureus - MIC*    CIPROFLOXACIN <=0.5 SENSITIVE Sensitive     ERYTHROMYCIN <=0.25 SENSITIVE Sensitive     GENTAMICIN <=0.5 SENSITIVE  Sensitive     OXACILLIN <=0.25 SENSITIVE Sensitive     TETRACYCLINE <=1 SENSITIVE Sensitive     VANCOMYCIN 1 SENSITIVE Sensitive     TRIMETH/SULFA <=10 SENSITIVE Sensitive     CLINDAMYCIN <=0.25 SENSITIVE Sensitive     RIFAMPIN <=0.5 SENSITIVE Sensitive     Inducible Clindamycin NEGATIVE Sensitive     * MODERATE STAPHYLOCOCCUS AUREUS  Surgical pcr screen     Status: Abnormal   Collection Time: 12/27/17   8:59 PM  Result Value Ref Range Status   MRSA, PCR NEGATIVE NEGATIVE Final   Staphylococcus aureus POSITIVE (A) NEGATIVE Final    Comment: (NOTE) The Xpert SA Assay (FDA approved for NASAL specimens in patients 61 years of age and older), is one component of a comprehensive surveillance program. It is not intended to diagnose infection nor to guide or monitor treatment. Performed at [facility], [place] [address] [place], [place] [address]   Aerobic/Anaerobic Culture (surgical/deep wound)     Status: None   Collection Time: 12/29/17  6:11 PM  Result Value Ref Range Status   Specimen Description FLUID LEFT LOWER ABDOMEN  Final   Special Requests PATIENT ON FOLLOWING VANC AND ZOSYN  Final   Gram Stain   Final    MODERATE WBC PRESENT, PREDOMINANTLY PMN NO ORGANISMS SEEN    Culture   Final    FEW STAPHYLOCOCCUS AUREUS NO ANAEROBES ISOLATED Performed at [facility], [place] [address], [place], [place] [address]    Report Status 01/03/2018 FINAL  Final   Organism ID, Bacteria STAPHYLOCOCCUS AUREUS  Final      Susceptibility   Staphylococcus aureus - MIC*    CIPROFLOXACIN <=0.5 SENSITIVE Sensitive     ERYTHROMYCIN <=0.25 SENSITIVE Sensitive     GENTAMICIN <=0.5 SENSITIVE Sensitive     OXACILLIN <=0.25 SENSITIVE Sensitive     TETRACYCLINE <=1 SENSITIVE Sensitive     VANCOMYCIN <=0.5 SENSITIVE Sensitive     TRIMETH/SULFA <=10 SENSITIVE Sensitive     CLINDAMYCIN <=0.25 SENSITIVE Sensitive     RIFAMPIN <=0.5 SENSITIVE Sensitive     Inducible Clindamycin NEGATIVE Sensitive     * FEW STAPHYLOCOCCUS AUREUS   Studies/Results: No results found.  Assessment/Plan:  INTERVAL HISTORY:  TEE scheduled for 2/13 - appreciate cardiology assistance Wound culture from 2/8 growing MSSA  Principal Problem:   Abdominal wall abscess Active Problems:   Insulin dependent type 2 diabetes mellitus, controlled (HCC)   Essential hypertension   GERD (gastroesophageal reflux disease)   Hx  of CABG 1990   OSA (obstructive sleep apnea)   Stage 3 chronic kidney disease (HCC)   AKI (acute kidney injury) ([place])   Atrial fibrillation, persistent (HCC)   COPD (chronic obstructive pulmonary disease) (HCC)   Normocytic anemia   Infected defibrillator (HCC)   MSSA (methicillin susceptible Staphylococcus aureus) infection  Victor [name] is a 82 y.o. male with PMH of CHF s/p ICD, HTN, OSA, COPD on chronic oxygen, T2DM, A fib hospitalized for recurrent abdominal wall abscess at site of previous abdominal ICD which has been out for several years, however the leads remain. He is s/p repeat I&D and exposed lead removal on 2/8 with wound culture growing MS Staph aureus. Blood cultures have remained negative, however possibly inaccurate results as patient had outpt ceftriaxone prior to last cultures being drawn.   HIV ab, HCV ab and HBV sAg - negative  Plan: 1. Continue cefazolin for now; will plan on at least 2 week course after  I&D (2/8) 2. TEE today 2/13; if new limb leads appear infected will affect treatment duration and may necessitate "new" ICD replacement   LOS: 8 days   Alphonzo Grieve  PGY - 2 Infectious Disease [phone] 01/03/2018, 12:53 PM

## 2018-01-03 NOTE — Interval H&P Note (Signed)
History and Physical Interval Note:  01/03/2018 10:34 AM  Victor [name]  has presented today for surgery, with the diagnosis of BACTEREMIA  The various methods of treatment have been discussed with the patient and family. After consideration of risks, benefits and other options for treatment, the patient has consented to  Procedure(s): TRANSESOPHAGEAL ECHOCARDIOGRAM (TEE) (N/A) as a surgical intervention .  The patient's history has been reviewed, patient examined, no change in status, stable for surgery.  I have reviewed the patient's chart and labs.  Questions were answered to the patient's satisfaction.     [name]

## 2018-01-03 NOTE — Anesthesia Procedure Notes (Signed)
Procedure Name: MAC Date/Time: 01/03/2018 11:07 AM Performed by: Teressa Lower., CRNA Pre-anesthesia Checklist: Patient identified, Emergency Drugs available, Suction available, Patient being monitored and Timeout performed Patient Re-evaluated:Patient Re-evaluated prior to induction Oxygen Delivery Method: Nasal cannula

## 2018-01-03 NOTE — Progress Notes (Signed)
      [phone]       [place] [address]             [phone]      5 Days Post-Op Procedure(s) (LRB): IRRIGATION AND DEBRIDEMENT ABDOMINAL WALL ABSCESS (Left) APPLICATION OF WOUND VAC (Left) Subjective: Feels ok  Objective: Vital signs in last 24 hours: Temp:  [97.5 F (36.4 C)-97.8 F (36.6 C)] 97.8 F (36.6 C) (02/13 0759) Pulse Rate:  [61-99] 85 (02/13 0759) Cardiac Rhythm: Bundle branch block;Heart block (02/13 0800) Resp:  [16-18] 16 (02/13 0759) BP: (133-174)/(53-70) 133/65 (02/13 0759) SpO2:  [95 %-100 %] 95 % (02/13 0759)  Hemodynamic parameters for last 24 hours:    Intake/Output from previous day: 02/12 0701 - 02/13 0700 In: 1123.4 [P.O.:240; I.V.:683.4; IV Piggyback:200] Out: 550 [Urine:550] Intake/Output this shift: No intake/output data recorded.  General appearance: alert, cooperative and no distress Heart: regular rate and rhythm and occas extrasystoles Lungs: some basilar crackles Abdomen: some tenderness near VAC Extremities: no edema Wound: vac inplace, surrounding erethema and induration conts to improve  Lab Results: Recent Labs    01/02/18 0226 01/03/18 0238  WBC 7.5 8.9  HGB 7.8* 8.5*  HCT 26.4* 28.3*  PLT 168 175   BMET:  Recent Labs    01/02/18 0226 01/03/18 0238  NA 141 138  K 4.0 3.9  CL 102 102  CO2 27 25  GLUCOSE 222* 174*  BUN 13 9  CREATININE 1.09 1.01  CALCIUM 8.2* 8.2*    PT/INR: No results for input(s): LABPROT, INR in the last 72 hours. ABG    Component Value Date/Time   PHART 7.475 (H) 12/06/2017 1730   HCO3 27.4 12/06/2017 1730   TCO2 32 03/20/2015 1638   O2SAT 98.1 12/06/2017 1730   CBG (last 3)  Recent Labs    01/02/18 1631 01/02/18 2119 01/03/18 0552  GLUCAP 146* 211* 135*    Meds Scheduled Meds: . amiodarone  200 mg Oral Daily  . bisoprolol  2.5 mg Oral Daily  . DULoxetine  30 mg Oral Daily  . insulin aspart  0-5 Units Subcutaneous QHS  . insulin aspart  0-9 Units  Subcutaneous TID WC  . insulin glargine  10 Units Subcutaneous QHS  . mouth rinse  15 mL Mouth Rinse BID  . mupirocin ointment   Nasal BID  . pantoprazole  40 mg Oral Daily  . pravastatin  40 mg Oral q1800  . senna-docusate  1 tablet Oral BID   Continuous Infusions: . sodium chloride 10 mL/hr at 12/31/17 0600  . sodium chloride 20 mL/hr at 01/03/18 0235  .  ceFAZolin (ANCEF) IV Stopped (01/03/18 0514)  . heparin 1,050 Units/hr (01/02/18 1325)   PRN Meds:.acetaminophen **OR** acetaminophen, HYDROcodone-acetaminophen, methocarbamol, MUSCLE RUB, nitroGLYCERIN, ondansetron **OR** ondansetron (ZOFRAN) IV, polyethylene glycol  Xrays No results found.  Assessment/Plan: S/P Procedure(s) (LRB): IRRIGATION AND DEBRIDEMENT ABDOMINAL WALL ABSCESS (Left) APPLICATION OF WOUND VAC (Left)  1 for VAC change today 2 no change in Rx/TX from surgical perspective, cont management of other issues per cardiology and primary service   LOS: 8 days    Victor [name] 01/03/2018

## 2018-01-03 NOTE — Transfer of Care (Signed)
Immediate Anesthesia Transfer of Care Note  Patient: Victor [name]  Procedure(s) Performed: TRANSESOPHAGEAL ECHOCARDIOGRAM (TEE) (N/A )  Patient Location: Endoscopy Unit  Anesthesia Type:MAC  Level of Consciousness: awake, alert  and oriented  Airway & Oxygen Therapy: Patient Spontanous Breathing and Patient connected to nasal cannula oxygen  Post-op Assessment: Report given to RN and Post -op Vital signs reviewed and stable  Post vital signs: Reviewed and stable  Last Vitals:  Vitals:   01/03/18 0759 01/03/18 1015  BP: 133/65 (!) 165/54  Pulse: 85 66  Resp: 16 16  Temp: 36.6 C 36.7 C  SpO2: 95% 95%    Last Pain:  Vitals:   01/03/18 1015  TempSrc: Oral  PainSc:       Patients Stated Pain Goal: 0 (84/69/62 9528)  Complications: No apparent anesthesia complications

## 2018-01-03 NOTE — Progress Notes (Signed)
  Echocardiogram Echocardiogram Transesophageal has been performed.  Victor [name] 01/03/2018, 12:00 PM

## 2018-01-04 ENCOUNTER — Encounter (HOSPITAL_COMMUNITY): Payer: Self-pay | Admitting: Cardiology

## 2018-01-04 LAB — GLUCOSE, CAPILLARY
GLUCOSE-CAPILLARY: 144 mg/dL — AB (ref 65–99)
Glucose-Capillary: 167 mg/dL — ABNORMAL HIGH (ref 65–99)
Glucose-Capillary: 145 mg/dL — ABNORMAL HIGH (ref 65–99)
Glucose-Capillary: 163 mg/dL — ABNORMAL HIGH (ref 65–99)

## 2018-01-04 LAB — BASIC METABOLIC PANEL
Anion gap: 10 (ref 5–15)
BUN: 10 mg/dL (ref 6–20)
CALCIUM: 8.4 mg/dL — AB (ref 8.9–10.3)
CO2: 26 mmol/L (ref 22–32)
Chloride: 104 mmol/L (ref 101–111)
Creatinine, Ser: 1.08 mg/dL (ref 0.61–1.24)
Glucose, Bld: 167 mg/dL — ABNORMAL HIGH (ref 65–99)
Potassium: 3.9 mmol/L (ref 3.5–5.1)
Sodium: 140 mmol/L (ref 135–145)

## 2018-01-04 LAB — HEPARIN LEVEL (UNFRACTIONATED): HEPARIN UNFRACTIONATED: 0.39 [IU]/mL (ref 0.30–0.70)

## 2018-01-04 LAB — CBC
HCT: 27.5 % — ABNORMAL LOW (ref 39.0–52.0)
Hemoglobin: 8.2 g/dL — ABNORMAL LOW (ref 13.0–17.0)
MCH: 25.4 pg — ABNORMAL LOW (ref 26.0–34.0)
MCHC: 29.8 g/dL — ABNORMAL LOW (ref 30.0–36.0)
MCV: 85.1 fL (ref 78.0–100.0)
Platelets: 192 10*3/uL (ref 150–400)
RBC: 3.23 MIL/uL — ABNORMAL LOW (ref 4.22–5.81)
RDW: 15.4 % (ref 11.5–15.5)
WBC: 8.6 10*3/uL (ref 4.0–10.5)

## 2018-01-04 NOTE — Progress Notes (Signed)
PROGRESS NOTE  Victor [name] FXT:[id number] DOB: Jun 22, 1936 DOA: 12/26/2017 PCP: Cyndi Bender, PA-C  HPI/Recap of past 24 hours:  Victor [name] is a 82 y.o. year old male with medical history significant for atrial fibrillation jon eliquis, CAD s/p CABG ( 1990), chronic combined systolic/diastolic CHF s/p ICD, history of VT ( 2012), T2DM, CKD Stage3, HTN, HLD who presented on 12/26/2017 with subjective fevers, chills , and increased pustular drainage, redness an pain of recently drained hematoma and was found to have left rectus sheath abdominal wall abscess. Of note patient was previous admitted from 1/13-1/25 for an infected abdominal wall hematoma.  There is initial concern at that time for potential infection however patient remained clinically stable with no signs or symptoms of infection.  Interestingly, superficial wound cultures of hematoma 1/21 grew MSSA.  Patient was discharged after Irrigation and debridement of hematoma with JP drain until 1/25.  Patient was not discharged on antibiotics as it was felt to be an uninfected hematoma around epicardial wires from an abandoned epicardial system without antibiotics.    Patient underwent incision and drainage with irrigation of left upper quadrant abdominal of old AICD pocket and placement of wound VAC during last hospitaliztion.  Since admission he was empirically treated with vancomycin and Zosyn.  Underwent I&D on 2/8 wound VAC placement by CT surgery vancomycin was discontinued on 2/9 due to kidney insufficiency.  Zosyn was switched to cefazolin on 2/10 based on intraoperative sensitivities showing MSSA.  Infectious disease consulted on 2/10 for guidance and antibiotic duration.  ID recommended consulting cardiology for possible TEE to evaluate leads in nature no associated vegetations/rule out endovascular infection related to the old device   Subjective He is taking a nap.  He wake up, denies dyspnea. Abdominal pain is controlled.     Assessment/Plan: Principal Problem:   Abdominal wall abscess Active Problems:   Insulin dependent type 2 diabetes mellitus, controlled (HCC)   Essential hypertension   GERD (gastroesophageal reflux disease)   Hx of CABG 1990   OSA (obstructive sleep apnea)   Stage 3 chronic kidney disease (HCC)   AKI (acute kidney injury) (HCC)   Atrial fibrillation, persistent (HCC)   COPD (chronic obstructive pulmonary disease) (HCC)   Normocytic anemia   Infected defibrillator (HCC)   MSSA (methicillin susceptible Staphylococcus aureus) infection  MSSA Left rectus sheath abdominal wall abscess related to old epicardial leads status post I&D on 2/8 and wound VAC placement, stable Remains stable, no signs of Sepsis. No bacteremia On cefazolin given MSSA on intraoperative and superficial wound cultures Blood cultures have remained negative -Appreciate ID recommendations, continue cefazolin, needs 2 weeks of antibiotics counting after sx.  -Wound vac in place and functional, CTS following. Continue to monitor.   -TEE negative for pacer wires with no thrombus or vegetation.  -Discharge when ok by CVTS> would he be discharge with wound vac ?Marland Kitchen   AKI on CKD Stage 3, resolved Creatinine back at baseline.  Peak of 2.3 ( up from baseline of 1.4-1.7)  Suspect related to vancomycin toxicity (Trough on 2/8:17), empiric has since been d/c'd -Monitor BMP -Avoid nephrotoxins  Atrial Fibrillation, rate controlled New onset from previous hospitalization on 11/2017. The previous cardiology plan was for DCCV in 3-4 weeks as an outpatient.  -Continue bisoprolol and Amiodarone -Holding home eliquis, on heparin.  -await CVTS recommendation to resume oral anticoagulation.   Acute on Chronic Anemia, worsened due to blood loss from abdominal hematoma Remote iron panel shows iron deficiency anemia  Baseline hgb 9-10 Presented with infected hematoma s/p ID so some blood loss since hospitalization Hb at 8.5.  Monitor    Combined systolic/diastolic CHF (01/14/81 TTE: EF 30-35%), stable -Started  Lasix 20 mg daily (home regimen: 40 mg twice daily) --monitor renal function.   COPD on home oxygen 3L, stable No wheezing or respiratory distress.   Currently back on home oxygen regimen   T2DM, controlled, A1c 8.6(12/2017) Range 165-225 in last 24 hours Lantus to 10 units, monitor blood glucose, sliding scale  HLD, stable Continue home statin  GERD, stable Continue protonix  Code Status: DNR  Family Communication: No family at bedside  Disposition Plan: Cefazolin ID consult to determine duration, awaiting cardiology recommendations, CTS following   Consultants:  CT surgery  Procedures: 2/10: Incision and drainage with irrigation of abdominal wound old AICD pocket, placement of wound VAC  Antimicrobials:  IV vancomycin 2/5--2/8  IV Zosyn 2/5--2/9  Cefazolin 2/10>>>    Cultures:  Superficial culture 12/27/17: MSSA(pan sensitive)  DVT prophylaxis: Heparin gtt  Objective: Vitals:   01/03/18 1204 01/03/18 1955 01/04/18 0606 01/04/18 0815  BP: (!) 136/58 (!) 148/59 (!) 122/49 (!) 151/71  Pulse:  64 61 60  Resp: 18 20 12 16   Temp: 97.8 F (36.6 C) 97.8 F (36.6 C) 97.8 F (36.6 C) 98 F (36.7 C)  TempSrc: Oral Oral Oral Oral  SpO2: 100% 100% 100% 100%  Weight:      Height:        Intake/Output Summary (Last 24 hours) at 01/04/2018 1312 Last data filed at 01/04/2018 0900 Gross per 24 hour  Intake 240 ml  Output 375 ml  Net -135 ml   Filed Weights   12/26/17 1206 12/29/17 1637 12/29/17 2011  Weight: 68.5 kg (151 lb) 68.5 kg (151 lb) 99.1 kg (218 lb 7.6 oz)    Exam:  Gen. NAD Resp. CTA CV. IRR, S 1, S 2  GI. BS present, soft, nt, wound vac in place.  Extremities. Trace edema   Data Reviewed: CBC: Recent Labs  Lab 12/29/17 0707  12/31/17 0333 01/01/18 0219 01/02/18 0226 01/03/18 0238 01/04/18 0323  WBC 6.8   < > 8.3 6.3 7.5 8.9 8.6  NEUTROABS 4.7   --   --   --   --   --   --   HGB 9.4*   < > 8.4* 7.8* 7.8* 8.5* 8.2*  HCT 30.7*   < > 27.4* 26.1* 26.4* 28.3* 27.5*  MCV 82.5   < > 82.5 83.9 84.6 85.0 85.1  PLT 188   < > 208 168 168 175 192   < > = values in this interval not displayed.   Basic Metabolic Panel: Recent Labs  Lab 12/31/17 0333 01/01/18 1424 01/02/18 0226 01/03/18 0238 01/04/18 0323  NA 138 138 141 138 140  K 4.2 3.8 4.0 3.9 3.9  CL 97* 99* 102 102 104  CO2 31 29 27 25 26   GLUCOSE 177* 242* 222* 174* 167*  BUN 28* 15 13 9 10   CREATININE 1.64* 1.21 1.09 1.01 1.08  CALCIUM 8.1* 8.0* 8.2* 8.2* 8.4*   GFR: Estimated Creatinine Clearance: 62.2 mL/min (by C-G formula based on SCr of 1.08 mg/dL). Liver Function Tests: No results for input(s): AST, ALT, ALKPHOS, BILITOT, PROT, ALBUMIN in the last 168 hours. No results for input(s): LIPASE, AMYLASE in the last 168 hours. No results for input(s): AMMONIA in the last 168 hours. Coagulation Profile: No results for input(s): INR, PROTIME in the last  168 hours. Cardiac Enzymes: No results for input(s): CKTOTAL, CKMB, CKMBINDEX, TROPONINI in the last 168 hours. BNP (last 3 results) No results for input(s): PROBNP in the last 8760 hours. HbA1C: No results for input(s): HGBA1C in the last 72 hours. CBG: Recent Labs  Lab 01/03/18 1216 01/03/18 1612 01/03/18 2142 01/04/18 0622 01/04/18 1135  GLUCAP 112* 144* 221* 167* 144*   Lipid Profile: No results for input(s): CHOL, HDL, LDLCALC, TRIG, CHOLHDL, LDLDIRECT in the last 72 hours. Thyroid Function Tests: No results for input(s): TSH, T4TOTAL, FREET4, T3FREE, THYROIDAB in the last 72 hours. Anemia Panel: No results for input(s): VITAMINB12, FOLATE, FERRITIN, TIBC, IRON, RETICCTPCT in the last 72 hours. Urine analysis:    Component Value Date/Time   COLORURINE YELLOW 12/28/2017 0016   APPEARANCEUR CLOUDY (A) 12/28/2017 0016   LABSPEC 1.026 12/28/2017 0016   PHURINE 5.0 12/28/2017 0016   GLUCOSEU NEGATIVE  12/28/2017 0016   HGBUR NEGATIVE 12/28/2017 0016   BILIRUBINUR NEGATIVE 12/28/2017 0016   KETONESUR NEGATIVE 12/28/2017 0016   PROTEINUR NEGATIVE 12/28/2017 0016   NITRITE NEGATIVE 12/28/2017 0016   LEUKOCYTESUR NEGATIVE 12/28/2017 0016   Sepsis Labs: @LABRCNTIP (procalcitonin:4,lacticidven:4)  ) Recent Results (from the past 240 hour(s))  Culture, blood (routine x 2)     Status: None   Collection Time: 12/26/17 12:50 PM  Result Value Ref Range Status   Specimen Description LEFT ANTECUBITAL  Final   Special Requests   Final    BOTTLES DRAWN AEROBIC AND ANAEROBIC Blood Culture adequate volume   Culture   Final    NO GROWTH 5 DAYS Performed at [facility], [place] [address], [place], [place] [address]    Report Status 12/31/2017 FINAL  Final  Culture, blood (routine x 2)     Status: None   Collection Time: 12/26/17  1:00 PM  Result Value Ref Range Status   Specimen Description BLOOD RIGHT ARM  Final   Special Requests   Final    BOTTLES DRAWN AEROBIC AND ANAEROBIC Blood Culture adequate volume   Culture   Final    NO GROWTH 5 DAYS Performed at [facility], [place] [address] [place], [place] [address]    Report Status 12/31/2017 FINAL  Final  Aerobic Culture (superficial specimen)     Status: None   Collection Time: 12/27/17 10:41 AM  Result Value Ref Range Status   Specimen Description ABSCESS ABDOMEN LEFT LOWER  Final   Special Requests NONE  Final   Gram Stain   Final    FEW WBC PRESENT, PREDOMINANTLY PMN FEW GRAM POSITIVE COCCI IN PAIRS Performed at [facility], [place] [address], [place], [place] [address]    Culture MODERATE STAPHYLOCOCCUS AUREUS  Final   Report Status 12/29/2017 FINAL  Final   Organism ID, Bacteria STAPHYLOCOCCUS AUREUS  Final      Susceptibility   Staphylococcus aureus - MIC*    CIPROFLOXACIN <=0.5 SENSITIVE Sensitive     ERYTHROMYCIN <=0.25 SENSITIVE Sensitive     GENTAMICIN <=0.5 SENSITIVE Sensitive     OXACILLIN <=0.25  SENSITIVE Sensitive     TETRACYCLINE <=1 SENSITIVE Sensitive     VANCOMYCIN 1 SENSITIVE Sensitive     TRIMETH/SULFA <=10 SENSITIVE Sensitive     CLINDAMYCIN <=0.25 SENSITIVE Sensitive     RIFAMPIN <=0.5 SENSITIVE Sensitive     Inducible Clindamycin NEGATIVE Sensitive     * MODERATE STAPHYLOCOCCUS AUREUS  Surgical pcr screen     Status: Abnormal   Collection Time: 12/27/17  8:59 PM  Result Value Ref Range Status   MRSA, PCR NEGATIVE NEGATIVE Final   Staphylococcus aureus POSITIVE (A) NEGATIVE Final    Comment: (NOTE) The Xpert SA Assay (FDA approved for NASAL specimens in patients 15 years of age and older), is one component of a comprehensive surveillance program. It is not intended to diagnose infection nor to guide or monitor treatment. Performed at [facility], [place] [address] [place], [place] [address]   Aerobic/Anaerobic Culture (surgical/deep wound)     Status: None   Collection Time: 12/29/17  6:11 PM  Result Value Ref Range Status   Specimen Description FLUID LEFT LOWER ABDOMEN  Final   Special Requests PATIENT ON FOLLOWING VANC AND ZOSYN  Final   Gram Stain   Final    MODERATE WBC PRESENT, PREDOMINANTLY PMN NO ORGANISMS SEEN    Culture   Final    FEW STAPHYLOCOCCUS AUREUS NO ANAEROBES ISOLATED Performed at [facility], [place] [address] [place], [place] [address]    Report Status 01/03/2018 FINAL  Final   Organism ID, Bacteria STAPHYLOCOCCUS AUREUS  Final      Susceptibility   Staphylococcus aureus - MIC*    CIPROFLOXACIN <=0.5 SENSITIVE Sensitive     ERYTHROMYCIN <=0.25 SENSITIVE Sensitive     GENTAMICIN <=0.5 SENSITIVE Sensitive     OXACILLIN <=0.25 SENSITIVE Sensitive     TETRACYCLINE <=1 SENSITIVE Sensitive     VANCOMYCIN <=0.5 SENSITIVE Sensitive     TRIMETH/SULFA <=10 SENSITIVE Sensitive     CLINDAMYCIN <=0.25 SENSITIVE Sensitive     RIFAMPIN <=0.5 SENSITIVE Sensitive     Inducible Clindamycin NEGATIVE Sensitive     * FEW STAPHYLOCOCCUS  AUREUS      Studies: No results found.  Scheduled Meds: . amiodarone  200 mg Oral Daily  . bisoprolol  2.5 mg Oral Daily  . DULoxetine  30 mg Oral Daily  . insulin aspart  0-5 Units Subcutaneous QHS  . insulin aspart  0-9 Units Subcutaneous TID WC  . insulin glargine  10 Units Subcutaneous QHS  . mouth rinse  15 mL Mouth Rinse BID  . mupirocin ointment   Nasal BID  . pantoprazole  40 mg Oral Daily  . pravastatin  40 mg Oral q1800  . senna-docusate  1 tablet Oral BID    Continuous Infusions: . sodium chloride Stopped (01/03/18 1138)  .  ceFAZolin (ANCEF) IV 2 g (01/04/18 1228)  . heparin 1,050 Units/hr (01/03/18 1445)     LOS: 9 days     Elmarie Shiley, MD [facility] Pager [phone]  If 7PM-7AM, please contact night-coverage [email or web address] Password [email or web address] 01/04/2018, 1:12 PM

## 2018-01-04 NOTE — Care Management Important Message (Signed)
Important Message  Patient Details  Name: Victor [name] MRN: [id number] Date of Birth: 09-23-1936   Medicare Important Message Given:  Yes    [name] 01/04/2018, 12:16 PM

## 2018-01-04 NOTE — Progress Notes (Signed)
Physical Therapy Treatment Patient Details Name: Victor [name] MRN: [id number] DOB: [birth year] Today's Date: 01/04/2018    History of Present Illness 82yo male with history of recent admission for I&D of hematoma and JP drain placement secondary to fluid surrounding epicardial wires of abandoned epicardial system, also found to have A-fib during this stay. JP drain was removed on 12/15/17. He then discharged home but noticed increased redness and pain at the drain site, wound continued to progress and eventually began to have puss flowing out of it. Diagnosed with L rectus sheath abdominal wall abscess. Received I&D, wound vac placement on 12/29/17. PMH A-fib, OA, cardiomyopathy, CHF, CKD, COPD, CAD, HTN, hx home O2 use, hx orthostatic hypotension, hx V-tach, PVD, CVA, TIA, DM, hx cardiac cath, hx CABG, ICD placement     PT Comments    Pt performed increased activity during session.  Pt requesting pain medicine post mobility due to abdominal discomfort.  Pt tolerated session well.  Plan next session for stair training.  HHPT remains appropriate for d/c.     Follow Up Recommendations  Home health PT;Supervision/Assistance - 24 hour     Equipment Recommendations  None recommended by PT    Recommendations for Other Services       Precautions / Restrictions Precautions Precautions: Fall;Other (comment) Precaution Comments: wound vac, abdominal wound  Restrictions Weight Bearing Restrictions: No    Mobility  Bed Mobility Overal bed mobility: Needs Assistance Bed Mobility: Rolling;Sit to Sidelying Rolling: Supervision       Sit to sidelying: Supervision General bed mobility comments: S for safety, cues for rolling due to abdominal wound  Transfers Overall transfer level: Needs assistance Equipment used: Rolling walker (2 wheeled) Transfers: Sit to/from Stand Sit to Stand: Supervision         General transfer comment: S for safety, line management    Ambulation/Gait Ambulation/Gait assistance: Supervision Ambulation Distance (Feet): 150 Feet Assistive device: Rolling walker (2 wheeled) Gait Pattern/deviations: Step-through pattern;Decreased stride length;Trunk flexed Gait velocity: Decreased Gait velocity interpretation: Below normal speed for age/gender General Gait Details: Slow, controlled ambulation with RW and supervision for safety. No significant unsteadiness or LOB noted. Cues for forward gaze to improve posture.     Stairs            Wheelchair Mobility    Modified Rankin (Stroke Patients Only)       Balance Overall balance assessment: Needs assistance Sitting-balance support: Bilateral upper extremity supported;Feet supported Sitting balance-Leahy Scale: Normal       Standing balance-Leahy Scale: Fair Standing balance comment: UE support during dynamic balance is beneficial                             Cognition Arousal/Alertness: Awake/alert Behavior During Therapy: WFL for tasks assessed/performed Overall Cognitive Status: Within Functional Limits for tasks assessed                                        Exercises      General Comments        Pertinent Vitals/Pain Pain Assessment: 0-10 Pain Score: 7  Pain Location: abdomen Pain Descriptors / Indicators: Sore Pain Intervention(s): Monitored during session;Repositioned    Home Living                      Prior Function  PT Goals (current goals can now be found in the care plan section) Acute Rehab PT Goals Patient Stated Goal: to go home Potential to Achieve Goals: Good Progress towards PT goals: Progressing toward goals    Frequency    Min 3X/week      PT Plan Current plan remains appropriate    Co-evaluation              AM-PAC PT "6 Clicks" Daily Activity  Outcome Measure  Difficulty turning over in bed (including adjusting bedclothes, sheets and blankets)?:  None Difficulty moving from lying on back to sitting on the side of the bed? : None Difficulty sitting down on and standing up from a chair with arms (e.g., wheelchair, bedside commode, etc,.)?: Unable Help needed moving to and from a bed to chair (including a wheelchair)?: A Little Help needed walking in hospital room?: A Little Help needed climbing 3-5 steps with a railing? : A Little 6 Click Score: 18    End of Session Equipment Utilized During Treatment: Oxygen(deferred use of gait belt due to abdominal incision.  ) Activity Tolerance: Patient tolerated treatment well Patient left: in bed;with call bell/phone within reach;with bed alarm set;with nursing/sitter in room Nurse Communication: Mobility status(informed nursing of increased pitting edema in B LEs L>R) PT Visit Diagnosis: Other abnormalities of gait and mobility (R26.89);Muscle weakness (generalized) (M62.81)     Time: 8828-0034 PT Time Calculation (min) (ACUTE ONLY): 25 min  Charges:  $Gait Training: 8-22 mins $Therapeutic Activity: 8-22 mins                    G Codes:       Governor Rooks, PTA pager [phone]    Cristela Blue 01/04/2018, 4:53 PM

## 2018-01-04 NOTE — Progress Notes (Addendum)
      [address]       [place] [address]             [phone]      1 Day Post-Op Procedure(s) (LRB): TRANSESOPHAGEAL ECHOCARDIOGRAM (TEE) (N/A) Subjective: Feels good this morning. Eating his breakfast in bed.   Objective: Vital signs in last 24 hours: Temp:  [97.7 F (36.5 C)-98 F (36.7 C)] 97.8 F (36.6 C) (02/14 0606) Pulse Rate:  [51-66] 61 (02/14 0606) Cardiac Rhythm: Heart block (02/14 0700) Resp:  [12-20] 12 (02/14 0606) BP: (122-165)/(48-59) 122/49 (02/14 0606) SpO2:  [95 %-100 %] 100 % (02/14 0606)     Intake/Output from previous day: 02/13 0701 - 02/14 0700 In: 200 [I.V.:200] Out: 175 [Urine:150; Drains:25] Intake/Output this shift: No intake/output data recorded.  General appearance: alert, cooperative and no distress Heart: regular rate and rhythm, S1, S2 normal, no murmur, click, rub or gallop Lungs: clear to auscultation bilaterally Abdomen: soft, non-tender; bowel sounds normal; no masses,  no organomegaly Extremities: 1+ pitting pedal edema on the left, no edema on the right Wound: wound vac in place with good suction  Lab Results: Recent Labs    01/03/18 0238 01/04/18 0323  WBC 8.9 8.6  HGB 8.5* 8.2*  HCT 28.3* 27.5*  PLT 175 192   BMET:  Recent Labs    01/03/18 0238 01/04/18 0323  NA 138 140  K 3.9 3.9  CL 102 104  CO2 25 26  GLUCOSE 174* 167*  BUN 9 10  CREATININE 1.01 1.08  CALCIUM 8.2* 8.4*    PT/INR: No results for input(s): LABPROT, INR in the last 72 hours. ABG    Component Value Date/Time   PHART 7.475 (H) 12/06/2017 1730   HCO3 27.4 12/06/2017 1730   TCO2 32 03/20/2015 1638   O2SAT 98.1 12/06/2017 1730   CBG (last 3)  Recent Labs    01/03/18 1612 01/03/18 2142 01/04/18 0622  GLUCAP 144* 221* 167*    Assessment/Plan: S/P Procedure(s) (LRB): TRANSESOPHAGEAL ECHOCARDIOGRAM (TEE) (N/A)  1. Wound vac changed yesterday at the bedside. Good suction on the vac.  2. Pain is well controlled  on Norco.  3. Continue medical management per cardiology and primary service.  4. TEE yesterday showed EF 30%, moderate TR, and mild to moderate MR. No evidence of thrombus or vegetation on the pacer wires.    LOS: 9 days    Victor [name] 01/04/2018 Cont vac until 2-18 then start daily wet/dry packing patient examined and medical record reviewed,agree with above note. Victor [name] 01/04/2018

## 2018-01-04 NOTE — Progress Notes (Signed)
Per insurance check on Eliquis  # 5.  S/W SYLVIA @ [employer] [facility] # [phone]    ELIQUIS 5 MG BID  COVER- YES  CO-PAY- $ 40.00  Q/L TWO PILL PER DAY  TIER- 3 DRUG  PRIOR APPROVAL- NO  PREFERRED PHARMACY : [facility]

## 2018-01-04 NOTE — Progress Notes (Signed)
      [address]       [place] [address]             [phone]     The plan from CT surgery perspective is VAC therapy till next Monday when he can be transitioned to daily packing ideally in a skilled [facility], PA-C

## 2018-01-04 NOTE — Progress Notes (Signed)
ANTICOAGULATION CONSULT NOTE - Follow Up Consult  Pharmacy Consult for Heparin Indication: atrial fibrillation  Allergies  Allergen Reactions  . Coreg [Carvedilol] Other (See Comments)    Shortness of breath ,fatigue ,dizzyness   . Lovastatin Other (See Comments)    CK elevation, Myalgias  . Ativan [Lorazepam] Other (See Comments)    Pt. had side effects    Patient Measurements: Height: 5\' 7"  (170.2 cm) Weight: 218 lb 7.6 oz (99.1 kg) IBW/kg (Calculated) : 66.1  Heparin Dosing Weight: 68 kg  Vital Signs: Temp: 97.8 F (36.6 C) (02/14 0606) Temp Source: Oral (02/14 0606) BP: 122/49 (02/14 0606) Pulse Rate: 61 (02/14 0606)  Labs: Recent Labs    01/02/18 0226 01/02/18 1108 01/03/18 0238 01/04/18 0323  HGB 7.8*  --  8.5* 8.2*  HCT 26.4*  --  28.3* 27.5*  PLT 168  --  175 192  HEPARINUNFRC 0.17* 0.30 0.46 0.39  CREATININE 1.09  --  1.01 1.08    Estimated Creatinine Clearance: 62.2 mL/min (by C-G formula based on SCr of 1.08 mg/dL).  Assessment: 64 YOM with recently diagnosis AFib who was started on Eliquis in January. Eliquis held for I&D for abdominal wall abscess on 12/29/17. Last Eliquis dose 2/4 prior to admission. Heparin was resumed post-op 2/11 AM per CVTS. Heparin level therapeutic. CBC stable. No bleeding documented.  Goal of Therapy:  Heparin level 0.3-0.7 units/ml Monitor platelets by anticoagulation protocol: Yes   Plan:  Continue IV heparin slightly at 1050 units/hr Monitor daily heparin level and CBC, s/sx bleeding Off Eliquis since admitted 2/5   Elicia Lamp, PharmD, BCPS Clinical Pharmacist Clinical phone for 01/04/2018 until 3:30pm: [phone] If after 3:30pm, please call main pharmacy at: [phone] 01/04/2018 8:19 AM

## 2018-01-05 LAB — HEPARIN LEVEL (UNFRACTIONATED)
HEPARIN UNFRACTIONATED: 0.36 [IU]/mL (ref 0.30–0.70)
Heparin Unfractionated: 0.21 IU/mL — ABNORMAL LOW (ref 0.30–0.70)

## 2018-01-05 LAB — CBC
HCT: 26.2 % — ABNORMAL LOW (ref 39.0–52.0)
Hemoglobin: 8 g/dL — ABNORMAL LOW (ref 13.0–17.0)
MCH: 26.2 pg (ref 26.0–34.0)
MCHC: 30.5 g/dL (ref 30.0–36.0)
MCV: 85.9 fL (ref 78.0–100.0)
Platelets: 204 10*3/uL (ref 150–400)
RBC: 3.05 MIL/uL — ABNORMAL LOW (ref 4.22–5.81)
RDW: 15.9 % — ABNORMAL HIGH (ref 11.5–15.5)
WBC: 8.3 10*3/uL (ref 4.0–10.5)

## 2018-01-05 LAB — GLUCOSE, CAPILLARY
GLUCOSE-CAPILLARY: 164 mg/dL — AB (ref 65–99)
Glucose-Capillary: 161 mg/dL — ABNORMAL HIGH (ref 65–99)
Glucose-Capillary: 229 mg/dL — ABNORMAL HIGH (ref 65–99)
Glucose-Capillary: 152 mg/dL — ABNORMAL HIGH (ref 65–99)

## 2018-01-05 NOTE — Progress Notes (Signed)
ANTICOAGULATION CONSULT NOTE - Follow Up Consult  Pharmacy Consult for Heparin Indication: atrial fibrillation  Allergies  Allergen Reactions  . Coreg [Carvedilol] Other (See Comments)    Shortness of breath ,fatigue ,dizzyness   . Lovastatin Other (See Comments)    CK elevation, Myalgias  . Ativan [Lorazepam] Other (See Comments)    Pt. had side effects    Patient Measurements: Height: 5\' 7"  (170.2 cm) Weight: 218 lb 7.6 oz (99.1 kg) IBW/kg (Calculated) : 66.1  Heparin Dosing Weight: 68 kg  Vital Signs: Temp: 98.2 F (36.8 C) (02/15 0800) Temp Source: Oral (02/15 0800) BP: 141/59 (02/15 0800) Pulse Rate: 68 (02/15 0341)  Labs: Recent Labs    01/03/18 0238 01/04/18 0323 01/05/18 0325 01/05/18 1301  HGB 8.5* 8.2* 8.0*  --   HCT 28.3* 27.5* 26.2*  --   PLT 175 192 204  --   HEPARINUNFRC 0.46 0.39 0.21* 0.36  CREATININE 1.01 1.08  --   --     Estimated Creatinine Clearance: 62.2 mL/min (by C-G formula based on SCr of 1.08 mg/dL).  Assessment: 9 YOM with recently diagnosis AFib who was started on Eliquis in January. Eliquis held for I&D for abdominal wall abscess on 12/29/17. Last Eliquis dose 2/4 prior to admission. Heparin was resumed post-op 2/11 AM per CVTS. Heparin level therapeutic. CBC stable. No bleeding documented.  Goal of Therapy:  Heparin level 0.3-0.7 units/ml Monitor platelets by anticoagulation protocol: Yes   Plan:  Continue heparin at 1150 units/hr Monitor daily heparin level and CBC, s/sx bleeding Off Eliquis since admitted 2/5   Elicia Lamp, PharmD, BCPS Clinical Pharmacist Clinical phone for 01/05/2018 until 3:30pm: [phone] If after 3:30pm, please call main pharmacy at: [phone] 01/05/2018 2:04 PM

## 2018-01-05 NOTE — Progress Notes (Addendum)
      [address]       [place] [address]             [phone]      2 Days Post-Op Procedure(s) (LRB): TRANSESOPHAGEAL ECHOCARDIOGRAM (TEE) (N/A) Subjective: Wound site pain well controlled  Objective: Vital signs in last 24 hours: Temp:  [97.8 F (36.6 C)-98.2 F (36.8 C)] 98.2 F (36.8 C) (02/15 0341) Pulse Rate:  [60-94] 68 (02/15 0341) Cardiac Rhythm: Bundle branch block;Heart block (02/14 1930) Resp:  [16-18] 18 (02/15 0341) BP: (143-158)/(53-71) 153/69 (02/15 0341) SpO2:  [99 %-100 %] 99 % (02/15 0341)  Hemodynamic parameters for last 24 hours:    Intake/Output from previous day: 02/14 0701 - 02/15 0700 In: 1884 [P.O.:1434; I.V.:250; IV Piggyback:200] Out: 1275 [Urine:1275] Intake/Output this shift: No intake/output data recorded.  General appearance: alert, cooperative and no distress Wound: Vac in place, induration and erethema cont to improve  Lab Results: Recent Labs    01/04/18 0323 01/05/18 0325  WBC 8.6 8.3  HGB 8.2* 8.0*  HCT 27.5* 26.2*  PLT 192 204   BMET:  Recent Labs    01/03/18 0238 01/04/18 0323  NA 138 140  K 3.9 3.9  CL 102 104  CO2 25 26  GLUCOSE 174* 167*  BUN 9 10  CREATININE 1.01 1.08  CALCIUM 8.2* 8.4*    PT/INR: No results for input(s): LABPROT, INR in the last 72 hours. ABG    Component Value Date/Time   PHART 7.475 (H) 12/06/2017 1730   HCO3 27.4 12/06/2017 1730   TCO2 32 03/20/2015 1638   O2SAT 98.1 12/06/2017 1730   CBG (last 3)  Recent Labs    01/04/18 1643 01/04/18 2134 01/05/18 0615  GLUCAP 145* 163* 161*    Meds Scheduled Meds: . amiodarone  200 mg Oral Daily  . bisoprolol  2.5 mg Oral Daily  . DULoxetine  30 mg Oral Daily  . insulin aspart  0-5 Units Subcutaneous QHS  . insulin aspart  0-9 Units Subcutaneous TID WC  . insulin glargine  10 Units Subcutaneous QHS  . mouth rinse  15 mL Mouth Rinse BID  . mupirocin ointment   Nasal BID  . pantoprazole  40 mg Oral Daily  .  pravastatin  40 mg Oral q1800  . senna-docusate  1 tablet Oral BID   Continuous Infusions: . sodium chloride Stopped (01/03/18 1138)  .  ceFAZolin (ANCEF) IV Stopped (01/05/18 0406)  . heparin 1,150 Units/hr (01/05/18 0435)   PRN Meds:.acetaminophen **OR** acetaminophen, HYDROcodone-acetaminophen, methocarbamol, MUSCLE RUB, nitroGLYCERIN, ondansetron **OR** ondansetron (ZOFRAN) IV, polyethylene glycol  Xrays No results found.  Assessment/Plan: S/P Procedure(s) (LRB): TRANSESOPHAGEAL ECHOCARDIOGRAM (TEE) (N/A)  1 stable from surgical perspective- patient wants home with HHN instead of SNF if possible Cont VAC until monday  LOS: 10 days    [name]  01/05/2018  Home with HHN to do daily wound packing/ care will be safe Cont po Keflex 2 weeks at home patient examined and medical record reviewed,agree with above note. Tharon Aquas Trigt III 01/06/2018

## 2018-01-05 NOTE — Progress Notes (Signed)
ANTICOAGULATION CONSULT NOTE - Follow Up Consult  Pharmacy Consult for Heparin  Indication: atrial fibrillation  Allergies  Allergen Reactions  . Coreg [Carvedilol] Other (See Comments)    Shortness of breath ,fatigue ,dizzyness   . Lovastatin Other (See Comments)    CK elevation, Myalgias  . Ativan [Lorazepam] Other (See Comments)    Pt. had side effects    Patient Measurements: Height: 5\' 7"  (170.2 cm) Weight: 218 lb 7.6 oz (99.1 kg) IBW/kg (Calculated) : 66.1  Heparin Dosing Weight: 68 kg  Vital Signs: Temp: 98.2 F (36.8 C) (02/15 0341) Temp Source: Oral (02/15 0341) BP: 153/69 (02/15 0341) Pulse Rate: 68 (02/15 0341)  Labs: Recent Labs    01/03/18 0238 01/04/18 0323 01/05/18 0325  HGB 8.5* 8.2* 8.0*  HCT 28.3* 27.5* 26.2*  PLT 175 192 204  HEPARINUNFRC 0.46 0.39 0.21*  CREATININE 1.01 1.08  --     Estimated Creatinine Clearance: 62.2 mL/min (by C-G formula based on SCr of 1.08 mg/dL).  Assessment: 5 YOM with recently diagnosis AFib who was started on Eliquis in January. Eliquis held for I&D for abdominal wall abscess on 12/29/17. Last Eliquis dose 2/4 prior to admission. Heparin was resumed post-op 2/11 AM per CVTS.   2/15 AM: heparin level sub-therapeutic this AM  Goal of Therapy:  Heparin level 0.3-0.7 units/ml Monitor platelets by anticoagulation protocol: Yes   Plan:  Inc heparin to 1150 units/hr 1300HL  Narda Bonds, PharmD, BCPS Clinical Pharmacist Phone: [phone]

## 2018-01-05 NOTE — Progress Notes (Signed)
PROGRESS NOTE  Victor [name] RCV:[id number] DOB: September 13, 1936 DOA: 12/26/2017 PCP: Cyndi Bender, PA-C  HPI/Recap of past 24 hours:  Victor [name] is a 82 y.o. year old male with medical history significant for atrial fibrillation jon eliquis, CAD s/p CABG ( 1990), chronic combined systolic/diastolic CHF s/p ICD, history of VT ( 2012), T2DM, CKD Stage3, HTN, HLD who presented on 12/26/2017 with subjective fevers, chills , and increased pustular drainage, redness an pain of recently drained hematoma and was found to have left rectus sheath abdominal wall abscess. Of note patient was previous admitted from 1/13-1/25 for an infected abdominal wall hematoma.  There is initial concern at that time for potential infection however patient remained clinically stable with no signs or symptoms of infection.  Interestingly, superficial wound cultures of hematoma 1/21 grew MSSA.  Patient was discharged after Irrigation and debridement of hematoma with JP drain until 1/25.  Patient was not discharged on antibiotics as it was felt to be an uninfected hematoma around epicardial wires from an abandoned epicardial system without antibiotics.    Patient underwent incision and drainage with irrigation of left upper quadrant abdominal of old AICD pocket and placement of wound VAC during last hospitaliztion.  Since admission he was empirically treated with vancomycin and Zosyn.  Underwent I&D on 2/8 wound VAC placement by CT surgery vancomycin was discontinued on 2/9 due to kidney insufficiency.  Zosyn was switched to cefazolin on 2/10 based on intraoperative sensitivities showing MSSA.  Infectious disease consulted on 2/10 for guidance and antibiotic duration.  ID recommended consulting cardiology for possible TEE to evaluate leads in nature no associated vegetations/rule out endovascular infection related to the old device   Subjective Sitting in bed. Denies dyspnea, abdominal pain controlled. He wants HH at discharge     Assessment/Plan: Principal Problem:   Abdominal wall abscess Active Problems:   Insulin dependent type 2 diabetes mellitus, controlled (HCC)   Essential hypertension   GERD (gastroesophageal reflux disease)   Hx of CABG 1990   OSA (obstructive sleep apnea)   Stage 3 chronic kidney disease (HCC)   AKI (acute kidney injury) (HCC)   Atrial fibrillation, persistent (HCC)   COPD (chronic obstructive pulmonary disease) (HCC)   Normocytic anemia   Infected defibrillator (HCC)   MSSA (methicillin susceptible Staphylococcus aureus) infection  MSSA Left rectus sheath abdominal wall abscess related to old epicardial leads status post I&D on 2/8 and wound VAC placement, stable Remains stable, no signs of Sepsis. No bacteremia On cefazolin given MSSA on intraoperative and superficial wound cultures Blood cultures have remained negative -Appreciate ID recommendations, continue cefazolin, needs 2 weeks of antibiotics counting after sx.  -Wound vac in place and functional, CTS following. Continue to monitor.   -TEE negative for pacer wires with no thrombus or vegetation.  -Discharge when ok by CVTS>  Per CVTS plan to discontinue wound Vac on Monday.  Continue with IV antibiotics.   AKI on CKD Stage 3, resolved Creatinine back at baseline.  Peak of 2.3 ( up from baseline of 1.4-1.7)  Suspect related to vancomycin toxicity (Trough on 2/8:17), empiric has since been d/c'd -Monitor BMP -Avoid nephrotoxins  Atrial Fibrillation, rate controlled New onset from previous hospitalization on 11/2017. The previous cardiology plan was for DCCV in 3-4 weeks as an outpatient.  -Continue bisoprolol and Amiodarone -Holding home eliquis, on heparin.  -await CVTS recommendation to resume oral anticoagulation.   Acute on Chronic Anemia, worsened due to blood loss from abdominal hematoma Remote iron  panel shows iron deficiency anemia Baseline hgb 9-10 Presented with infected hematoma s/p ID so some  blood loss since hospitalization Hb at 8.5. Monitor    Combined systolic/diastolic CHF (04/21/08 TTE: EF 30-35%), stable -Started  Lasix 20 mg daily (home regimen: 40 mg twice daily) --monitor renal function.   COPD on home oxygen 3L, stable No wheezing or respiratory distress.   Currently back on home oxygen regimen   T2DM, controlled, A1c 8.6(12/2017) Range 165-225 in last 24 hours Lantus to 10 units, monitor blood glucose, sliding scale  HLD, stable Continue home statin  GERD, stable Continue protonix  Code Status: DNR  Family Communication: No family at bedside  Disposition Plan: Cefazolin ID consult to determine duration, awaiting cardiology recommendations, CTS following   Consultants:  CT surgery  Procedures: 2/10: Incision and drainage with irrigation of abdominal wound old AICD pocket, placement of wound VAC  Antimicrobials:  IV vancomycin 2/5--2/8  IV Zosyn 2/5--2/9  Cefazolin 2/10>>>    Cultures:  Superficial culture 12/27/17: MSSA(pan sensitive)  DVT prophylaxis: Heparin gtt  Objective: Vitals:   01/04/18 1358 01/04/18 2001 01/05/18 0341 01/05/18 0800  BP: (!) 158/53 (!) 143/63 (!) 153/69 (!) 141/59  Pulse: 60 94 68   Resp: 18 16 18    Temp: 98.1 F (36.7 C) 97.8 F (36.6 C) 98.2 F (36.8 C) 98.2 F (36.8 C)  TempSrc: Oral Oral Oral Oral  SpO2: 100% 100% 99%   Weight:      Height:        Intake/Output Summary (Last 24 hours) at 01/05/2018 1329 Last data filed at 01/05/2018 0800 Gross per 24 hour  Intake 1884 ml  Output 1075 ml  Net 809 ml   Filed Weights   12/26/17 1206 12/29/17 1637 12/29/17 2011  Weight: 68.5 kg (151 lb) 68.5 kg (151 lb) 99.1 kg (218 lb 7.6 oz)    Exam:  Gen. NAD Resp. CTA CV. IRR, S 1, S 2 RRR GI. BS present, soft, wound vac in place.  Extremities. Trace edema   Data Reviewed: CBC: Recent Labs  Lab 01/01/18 0219 01/02/18 0226 01/03/18 0238 01/04/18 0323 01/05/18 0325  WBC 6.3 7.5 8.9 8.6 8.3   HGB 7.8* 7.8* 8.5* 8.2* 8.0*  HCT 26.1* 26.4* 28.3* 27.5* 26.2*  MCV 83.9 84.6 85.0 85.1 85.9  PLT 168 168 175 192 323   Basic Metabolic Panel: Recent Labs  Lab 12/31/17 0333 01/01/18 1424 01/02/18 0226 01/03/18 0238 01/04/18 0323  NA 138 138 141 138 140  K 4.2 3.8 4.0 3.9 3.9  CL 97* 99* 102 102 104  CO2 31 29 27 25 26   GLUCOSE 177* 242* 222* 174* 167*  BUN 28* 15 13 9 10   CREATININE 1.64* 1.21 1.09 1.01 1.08  CALCIUM 8.1* 8.0* 8.2* 8.2* 8.4*   GFR: Estimated Creatinine Clearance: 62.2 mL/min (by C-G formula based on SCr of 1.08 mg/dL). Liver Function Tests: No results for input(s): AST, ALT, ALKPHOS, BILITOT, PROT, ALBUMIN in the last 168 hours. No results for input(s): LIPASE, AMYLASE in the last 168 hours. No results for input(s): AMMONIA in the last 168 hours. Coagulation Profile: No results for input(s): INR, PROTIME in the last 168 hours. Cardiac Enzymes: No results for input(s): CKTOTAL, CKMB, CKMBINDEX, TROPONINI in the last 168 hours. BNP (last 3 results) No results for input(s): PROBNP in the last 8760 hours. HbA1C: No results for input(s): HGBA1C in the last 72 hours. CBG: Recent Labs  Lab 01/04/18 1135 01/04/18 1643 01/04/18 2134 01/05/18 0615 01/05/18  [place]   Lipid Profile: No results for input(s): CHOL, HDL, LDLCALC, TRIG, CHOLHDL, LDLDIRECT in the last 72 hours. Thyroid Function Tests: No results for input(s): TSH, T4TOTAL, FREET4, T3FREE, THYROIDAB in the last 72 hours. Anemia Panel: No results for input(s): VITAMINB12, FOLATE, FERRITIN, TIBC, IRON, RETICCTPCT in the last 72 hours. Urine analysis:    Component Value Date/Time   COLORURINE YELLOW 12/28/2017 0016   APPEARANCEUR CLOUDY (A) 12/28/2017 0016   LABSPEC 1.026 12/28/2017 0016   PHURINE 5.0 12/28/2017 0016   GLUCOSEU NEGATIVE 12/28/2017 0016   HGBUR NEGATIVE 12/28/2017 0016   BILIRUBINUR NEGATIVE 12/28/2017 0016   KETONESUR NEGATIVE 12/28/2017 0016    PROTEINUR NEGATIVE 12/28/2017 0016   NITRITE NEGATIVE 12/28/2017 0016   LEUKOCYTESUR NEGATIVE 12/28/2017 0016   Sepsis Labs: @LABRCNTIP (procalcitonin:4,lacticidven:4)  ) Recent Results (from the past 240 hour(s))  Aerobic Culture (superficial specimen)     Status: None   Collection Time: 12/27/17 10:41 AM  Result Value Ref Range Status   Specimen Description ABSCESS ABDOMEN LEFT LOWER  Final   Special Requests NONE  Final   Gram Stain   Final    FEW WBC PRESENT, PREDOMINANTLY PMN FEW GRAM POSITIVE COCCI IN PAIRS Performed at [facility], [place] [address], [place], [place] [address]    Culture MODERATE STAPHYLOCOCCUS AUREUS  Final   Report Status 12/29/2017 FINAL  Final   Organism ID, Bacteria STAPHYLOCOCCUS AUREUS  Final      Susceptibility   Staphylococcus aureus - MIC*    CIPROFLOXACIN <=0.5 SENSITIVE Sensitive     ERYTHROMYCIN <=0.25 SENSITIVE Sensitive     GENTAMICIN <=0.5 SENSITIVE Sensitive     OXACILLIN <=0.25 SENSITIVE Sensitive     TETRACYCLINE <=1 SENSITIVE Sensitive     VANCOMYCIN 1 SENSITIVE Sensitive     TRIMETH/SULFA <=10 SENSITIVE Sensitive     CLINDAMYCIN <=0.25 SENSITIVE Sensitive     RIFAMPIN <=0.5 SENSITIVE Sensitive     Inducible Clindamycin NEGATIVE Sensitive     * MODERATE STAPHYLOCOCCUS AUREUS  Surgical pcr screen     Status: Abnormal   Collection Time: 12/27/17  8:59 PM  Result Value Ref Range Status   MRSA, PCR NEGATIVE NEGATIVE Final   Staphylococcus aureus POSITIVE (A) NEGATIVE Final    Comment: (NOTE) The Xpert SA Assay (FDA approved for NASAL specimens in patients 97 years of age and older), is one component of a comprehensive surveillance program. It is not intended to diagnose infection nor to guide or monitor treatment. Performed at [facility], [place] [address] [place], [place] [address]   Aerobic/Anaerobic Culture (surgical/deep wound)     Status: None   Collection Time: 12/29/17  6:11 PM  Result Value Ref Range Status    Specimen Description FLUID LEFT LOWER ABDOMEN  Final   Special Requests PATIENT ON FOLLOWING VANC AND ZOSYN  Final   Gram Stain   Final    MODERATE WBC PRESENT, PREDOMINANTLY PMN NO ORGANISMS SEEN    Culture   Final    FEW STAPHYLOCOCCUS AUREUS NO ANAEROBES ISOLATED Performed at [facility], [place] [address] [place], [place] [address]    Report Status 01/03/2018 FINAL  Final   Organism ID, Bacteria STAPHYLOCOCCUS AUREUS  Final      Susceptibility   Staphylococcus aureus - MIC*    CIPROFLOXACIN <=0.5 SENSITIVE Sensitive     ERYTHROMYCIN <=0.25 SENSITIVE Sensitive     GENTAMICIN <=0.5 SENSITIVE Sensitive     OXACILLIN <=  0.25 SENSITIVE Sensitive     TETRACYCLINE <=1 SENSITIVE Sensitive     VANCOMYCIN <=0.5 SENSITIVE Sensitive     TRIMETH/SULFA <=10 SENSITIVE Sensitive     CLINDAMYCIN <=0.25 SENSITIVE Sensitive     RIFAMPIN <=0.5 SENSITIVE Sensitive     Inducible Clindamycin NEGATIVE Sensitive     * FEW STAPHYLOCOCCUS AUREUS      Studies: No results found.  Scheduled Meds: . amiodarone  200 mg Oral Daily  . bisoprolol  2.5 mg Oral Daily  . DULoxetine  30 mg Oral Daily  . insulin aspart  0-5 Units Subcutaneous QHS  . insulin aspart  0-9 Units Subcutaneous TID WC  . insulin glargine  10 Units Subcutaneous QHS  . mouth rinse  15 mL Mouth Rinse BID  . mupirocin ointment   Nasal BID  . pantoprazole  40 mg Oral Daily  . pravastatin  40 mg Oral q1800  . senna-docusate  1 tablet Oral BID    Continuous Infusions: . sodium chloride Stopped (01/03/18 1138)  .  ceFAZolin (ANCEF) IV 2 g (01/05/18 1250)  . heparin 1,150 Units/hr (01/05/18 0435)     LOS: 10 days     Elmarie Shiley, MD [facility] Pager [phone]  If 7PM-7AM, please contact night-coverage [email or web address] Password [facility] 01/05/2018, 1:29 PM

## 2018-01-05 NOTE — Consult Note (Signed)
   [facility] CM Inpatient Consult   01/05/2018  Victor [name] [birth year] [id number]    Patient screened for multiple hospitalizations and hospital length of stay.   Chart reviewed and noted patient Primary Care MD office [facility]) is listed as doing their own transition of care calls post hospital discharge.  Spoke with inpatient RNCM. Patient discharge plan likely SNF. No identifiable [facility] Care Management needs at this time.    Marthenia Rolling, MSN-Ed, RN,BSN [facility] Liaison [phone]

## 2018-01-05 NOTE — Progress Notes (Signed)
Changed wound vac dressing in left mid abdomen. Wound base with granulation and no bleeding.  Patient tolerated procedure well. Patient in bed but will sit in chair for a while until shift change. Pt resting with call bell within reach.  Will continue to monitor. Payton Emerald, RN

## 2018-01-05 NOTE — Progress Notes (Signed)
PT Cancellation Note  Patient Details Name: Victor [name] MRN: [id number] DOB: July 07, 1936   Cancelled Treatment:    Reason Eval/Treat Not Completed: Patient declined, no reason specified Patient declined due to awaiting abdominal dressing change per pt. PT will continue to follow acutely.   Earney Navy, PTA Pager: [phone]    01/05/2018, 3:45 PM

## 2018-01-06 LAB — GLUCOSE, CAPILLARY
GLUCOSE-CAPILLARY: 89 mg/dL (ref 65–99)
Glucose-Capillary: 159 mg/dL — ABNORMAL HIGH (ref 65–99)
Glucose-Capillary: 124 mg/dL — ABNORMAL HIGH (ref 65–99)
Glucose-Capillary: 137 mg/dL — ABNORMAL HIGH (ref 65–99)

## 2018-01-06 LAB — BASIC METABOLIC PANEL
Anion gap: 11 (ref 5–15)
BUN: 8 mg/dL (ref 6–20)
CHLORIDE: 104 mmol/L (ref 101–111)
CO2: 24 mmol/L (ref 22–32)
CREATININE: 1.07 mg/dL (ref 0.61–1.24)
Calcium: 8.5 mg/dL — ABNORMAL LOW (ref 8.9–10.3)
GFR calc Af Amer: >60 mL/min (ref >60)
GFR calc non Af Amer: >60 mL/min (ref >60)
Glucose, Bld: 132 mg/dL — ABNORMAL HIGH (ref 65–99)
Potassium: 4.2 mmol/L (ref 3.5–5.1)
Sodium: 139 mmol/L (ref 135–145)

## 2018-01-06 LAB — CBC
HCT: 27.8 % — ABNORMAL LOW (ref 39.0–52.0)
Hemoglobin: 8.3 g/dL — ABNORMAL LOW (ref 13.0–17.0)
MCH: 25.8 pg — ABNORMAL LOW (ref 26.0–34.0)
MCHC: 29.9 g/dL — ABNORMAL LOW (ref 30.0–36.0)
MCV: 86.3 fL (ref 78.0–100.0)
Platelets: 232 10*3/uL (ref 150–400)
RBC: 3.22 MIL/uL — ABNORMAL LOW (ref 4.22–5.81)
RDW: 16.4 % — ABNORMAL HIGH (ref 11.5–15.5)
WBC: 10.5 10*3/uL (ref 4.0–10.5)

## 2018-01-06 LAB — HEPARIN LEVEL (UNFRACTIONATED): HEPARIN UNFRACTIONATED: 0.32 [IU]/mL (ref 0.30–0.70)

## 2018-01-06 MED ORDER — POTASSIUM CHLORIDE CRYS ER 20 MEQ PO TBCR
40.0000 meq | EXTENDED_RELEASE_TABLET | Freq: Once | ORAL | Status: AC
  Administered 2018-01-06: 40 meq via ORAL
  Filled 2018-01-06: qty 2

## 2018-01-06 MED ORDER — APIXABAN 5 MG PO TABS
5.0000 mg | ORAL_TABLET | Freq: Two times a day (BID) | ORAL | Status: DC
  Administered 2018-01-06 – 2018-01-10 (×9): 5 mg via ORAL
  Filled 2018-01-06 (×9): qty 1

## 2018-01-06 MED ORDER — FUROSEMIDE 10 MG/ML IJ SOLN
40.0000 mg | Freq: Two times a day (BID) | INTRAMUSCULAR | Status: DC
  Administered 2018-01-06 – 2018-01-08 (×4): 40 mg via INTRAVENOUS
  Filled 2018-01-06 (×4): qty 4

## 2018-01-06 NOTE — Progress Notes (Addendum)
      [address]       [place] [address]             [phone]      3 Days Post-Op Procedure(s) (LRB): TRANSESOPHAGEAL ECHOCARDIOGRAM (TEE) (N/A) Subjective: Feels more SOB today and more swollen in lower extremeties  Objective: Vital signs in last 24 hours: Temp:  [97.6 F (36.4 C)-98.3 F (36.8 C)] 97.9 F (36.6 C) (02/16 0816) Pulse Rate:  [61-65] 61 (02/16 0816) Cardiac Rhythm: Heart block (02/16 0816) Resp:  [14-19] 19 (02/16 0816) BP: (92-139)/(50-100) 122/100 (02/16 0816) SpO2:  [94 %-100 %] 100 % (02/16 0816) Weight:  [173 lb 11.2 oz (78.8 kg)] 173 lb 11.2 oz (78.8 kg) (02/16 0521)  Hemodynamic parameters for last 24 hours:    Intake/Output from previous day: 02/15 0701 - 02/16 0700 In: 1810 [P.O.:1510; IV Piggyback:300] Out: 1100 [Urine:1100] Intake/Output this shift: Total I/O In: 120 [P.O.:120] Out: 210 [Urine:210]  General appearance: alert, cooperative and no distress Wound: vac in place, appears stable  Lab Results: Recent Labs    01/05/18 0325 01/06/18 0320  WBC 8.3 10.5  HGB 8.0* 8.3*  HCT 26.2* 27.8*  PLT 204 232   BMET:  Recent Labs    01/04/18 0323  NA 140  K 3.9  CL 104  CO2 26  GLUCOSE 167*  BUN 10  CREATININE 1.08  CALCIUM 8.4*    PT/INR: No results for input(s): LABPROT, INR in the last 72 hours. ABG    Component Value Date/Time   PHART 7.475 (H) 12/06/2017 1730   HCO3 27.4 12/06/2017 1730   TCO2 32 03/20/2015 1638   O2SAT 98.1 12/06/2017 1730   CBG (last 3)  Recent Labs    01/05/18 1637 01/05/18 2136 01/06/18 0617  GLUCAP 152* 164* 159*    Meds Scheduled Meds: . amiodarone  200 mg Oral Daily  . bisoprolol  2.5 mg Oral Daily  . DULoxetine  30 mg Oral Daily  . insulin aspart  0-5 Units Subcutaneous QHS  . insulin aspart  0-9 Units Subcutaneous TID WC  . insulin glargine  10 Units Subcutaneous QHS  . mouth rinse  15 mL Mouth Rinse BID  . mupirocin ointment   Nasal BID  . pantoprazole   40 mg Oral Daily  . pravastatin  40 mg Oral q1800  . senna-docusate  1 tablet Oral BID   Continuous Infusions: . sodium chloride Stopped (01/03/18 1138)  .  ceFAZolin (ANCEF) IV Stopped (01/06/18 0426)  . heparin 1,150 Units/hr (01/05/18 1712)   PRN Meds:.acetaminophen **OR** acetaminophen, HYDROcodone-acetaminophen, methocarbamol, MUSCLE RUB, nitroGLYCERIN, ondansetron **OR** ondansetron (ZOFRAN) IV, polyethylene glycol  Xrays No results found.  Assessment/Plan: S/P Procedure(s) (LRB): TRANSESOPHAGEAL ECHOCARDIOGRAM (TEE) (N/A)   1 conts current management as outlined for wound care abx 2 medical management as per primary service 3 will have office arrange f/u appointment with patient for one week   LOS: 11 days    [name] 01/06/2018  remove vac sponge Monday and pack with Saline wet to dry 4x4 guaze. Will follow as outpt Face to face completed for [facility] patient examined and medical record reviewed,agree with above note. Tharon Aquas Trigt III 01/06/2018

## 2018-01-06 NOTE — Progress Notes (Addendum)
ANTICOAGULATION CONSULT NOTE - Follow Up Consult  Pharmacy Consult for Heparin Indication: atrial fibrillation  Allergies  Allergen Reactions  . Coreg [Carvedilol] Other (See Comments)    Shortness of breath ,fatigue ,dizzyness   . Lovastatin Other (See Comments)    CK elevation, Myalgias  . Ativan [Lorazepam] Other (See Comments)    Pt. had side effects    Patient Measurements: Height: 5\' 7"  (170.2 cm) Weight: 173 lb 11.2 oz (78.8 kg) IBW/kg (Calculated) : 66.1  Heparin Dosing Weight: 68 kg  Vital Signs: Temp: 97.9 F (36.6 C) (02/16 0816) Temp Source: Oral (02/16 0816) BP: 122/100 (02/16 0816) Pulse Rate: 61 (02/16 0816)  Labs: Recent Labs    01/04/18 0323 01/05/18 0325 01/05/18 1301 01/06/18 0320  HGB 8.2* 8.0*  --  8.3*  HCT 27.5* 26.2*  --  27.8*  PLT 192 204  --  232  HEPARINUNFRC 0.39 0.21* 0.36 0.32  CREATININE 1.08  --   --   --     Estimated Creatinine Clearance: 51.9 mL/min (by C-G formula based on SCr of 1.08 mg/dL).  Assessment: 28 YOM with recently diagnosis AFib who was started on Eliquis in January. Eliquis held for I&D for abdominal wall abscess on 12/29/17. Last Eliquis dose 2/4 prior to admission. Heparin was resumed post-op 2/11 AM per CVTS.   Heparin level is therapeutic at 0.32, on 1150 units/hr. CBC stable. No signs/symptoms of bleeding. No bleeding documented.  Goal of Therapy:  Heparin level 0.3-0.7 units/ml Monitor platelets by anticoagulation protocol: Yes   Plan:  Increase heparin infusion slightly to 1200 units/hr to keep in goal range Monitor daily heparin level and CBC, s/sx bleeding Off Eliquis since admitted 2/5 - will defer to CTVS for timing of resuming  Doylene Canard, PharmD Clinical Pharmacist  Pager: [phone] Clinical Phone for 01/06/2018 until 3:30pm: [phone] If after 3:30pm, please call main pharmacy at [phone] 01/06/2018 12:04 PM   ADDENDUM Consult placed to change from heparin to apixaban. Was on 5 mg twice  daily PTA- dose still appropriate (age<80 yrs, wt>60 kg, Scr<1.5). Will discontinue heparin infusion and give apixaban at time of discontinuation. Will monitor Scr, CBC, and for s/sx of bleeding.   Doylene Canard, PharmD Clinical Pharmacist  Pager: [phone] Phone: [phone]

## 2018-01-06 NOTE — Progress Notes (Signed)
PROGRESS NOTE  Victor [name] OMV:[id number] DOB: 1936-03-13 DOA: 12/26/2017 PCP: Cyndi Bender, PA-C  HPI/Recap of past 24 hours:  Victor [name] is a 82 y.o. year old male with medical history significant for atrial fibrillation jon eliquis, CAD s/p CABG ( 1990), chronic combined systolic/diastolic CHF s/p ICD, history of VT ( 2012), T2DM, CKD Stage3, HTN, HLD who presented on 12/26/2017 with subjective fevers, chills , and increased pustular drainage, redness an pain of recently drained hematoma and was found to have left rectus sheath abdominal wall abscess. Of note patient was previous admitted from 1/13-1/25 for an infected abdominal wall hematoma.  There is initial concern at that time for potential infection however patient remained clinically stable with no signs or symptoms of infection.  Interestingly, superficial wound cultures of hematoma 1/21 grew MSSA.  Patient was discharged after Irrigation and debridement of hematoma with JP drain until 1/25.  Patient was not discharged on antibiotics as it was felt to be an uninfected hematoma around epicardial wires from an abandoned epicardial system without antibiotics.    Patient underwent incision and drainage with irrigation of left upper quadrant abdominal of old AICD pocket and placement of wound VAC during last hospitaliztion.  Since admission he was empirically treated with vancomycin and Zosyn.  Underwent I&D on 2/8 wound VAC placement by CT surgery vancomycin was discontinued on 2/9 due to kidney insufficiency.  Zosyn was switched to cefazolin on 2/10 based on intraoperative sensitivities showing MSSA.  Infectious disease consulted on 2/10 for guidance and antibiotic duration.  ID recommended consulting cardiology for possible TEE to evaluate leads in nature no associated vegetations/rule out endovascular infection related to the old device   Subjective He report SOB on exertion. Also notice worsening LE edema.  Abdominal pain controlled.    Plan is for SNF at discharge.    Assessment/Plan: Principal Problem:   Abdominal wall abscess Active Problems:   Insulin dependent type 2 diabetes mellitus, controlled (HCC)   Essential hypertension   GERD (gastroesophageal reflux disease)   Hx of CABG 1990   OSA (obstructive sleep apnea)   Stage 3 chronic kidney disease (HCC)   AKI (acute kidney injury) (HCC)   Atrial fibrillation, persistent (HCC)   COPD (chronic obstructive pulmonary disease) (HCC)   Normocytic anemia   Infected defibrillator (HCC)   MSSA (methicillin susceptible Staphylococcus aureus) infection  MSSA Left rectus sheath abdominal wall abscess related to old epicardial leads status post I&D on 2/8 and wound VAC placement, stable Remains stable, no signs of Sepsis. No bacteremia On cefazolin given MSSA on intraoperative and superficial wound cultures Blood cultures have remained negative -Appreciate ID recommendations, continue cefazolin, needs 2 weeks of antibiotics counting after sx.  -Wound vac in place and functional, CTS following. Continue to monitor.   -TEE negative for pacer wires with no thrombus or vegetation.  Per CVTS plan to discontinue wound Vac on Monday.  Continue with IV antibiotics.   AKI on CKD Stage 3, resolved Creatinine back at baseline.  Peak of 2.3 ( up from baseline of 1.4-1.7)  Suspect related to vancomycin toxicity (Trough on 2/8:17), empiric has since been d/c'd -Monitor BMP -Avoid nephrotoxins  Atrial Fibrillation, rate controlled New onset from previous hospitalization on 11/2017. The previous cardiology plan was for DCCV in 3-4 weeks as an outpatient.  -Continue bisoprolol and Amiodarone -Holding home eliquis, on heparin.  -discussed with PA, from CVTS, ok to transition form heparin to eliquis.   Acute on Chronic Anemia, worsened due  to blood loss from abdominal hematoma Remote iron panel shows iron deficiency anemia Baseline hgb 9-10 Presented with infected hematoma s/p  ID so some blood loss since hospitalization Hb at 8.5. Monitor    Acute Combined systolic/diastolic CHF (03/25/38 TTE: EF 30-35%), stable Worsening dyspnea on exertion, worsening LE edema.  Will start IV lasix 40 mg IV BID.  Monitor renal function.  Daily weight. Strict I and O.  173---  COPD on home oxygen 3L, stable No wheezing or respiratory distress.   Currently back on home oxygen regimen   T2DM, controlled, A1c 8.6(12/2017) Range 165-225 in last 24 hours Lantus to 10 units, monitor blood glucose, sliding scale  HLD, stable Continue home statin  GERD, stable Continue protonix  Code Status: DNR  Family Communication: No family at bedside  Disposition Plan: SNF on Monday if stable.    Consultants:  CT surgery  Procedures: 2/10: Incision and drainage with irrigation of abdominal wound old AICD pocket, placement of wound VAC  Antimicrobials:  IV vancomycin 2/5--2/8  IV Zosyn 2/5--2/9  Cefazolin 2/10>>>    Cultures:  Superficial culture 12/27/17: MSSA(pan sensitive)  DVT prophylaxis: Heparin gtt  Objective: Vitals:   01/05/18 1933 01/06/18 0521 01/06/18 0816 01/06/18 1215  BP: (!) 136/50 92/75 (!) 122/100 (!) 137/45  Pulse: 61 65 61 68  Resp: 14 18 19 19   Temp: 98.3 F (36.8 C) 98.3 F (36.8 C) 97.9 F (36.6 C) 98 F (36.7 C)  TempSrc: Oral Oral Oral Oral  SpO2: 99% 94% 100% 96%  Weight:  78.8 kg (173 lb 11.2 oz)    Height:        Intake/Output Summary (Last 24 hours) at 01/06/2018 1317 Last data filed at 01/06/2018 1215 Gross per 24 hour  Intake 2417.37 ml  Output 1310 ml  Net 1107.37 ml   Filed Weights   12/29/17 1637 12/29/17 2011 01/06/18 0521  Weight: 68.5 kg (151 lb) 99.1 kg (218 lb 7.6 oz) 78.8 kg (173 lb 11.2 oz)    Exam:  Gen. NAD Resp. Crackles bases.  CV. IRR, S 1, S 2 RRR GI. BS, present, soft, wound vac in place.  Extremities. Plus 2 edema  Data Reviewed: CBC: Recent Labs  Lab 01/02/18 0226 01/03/18 0238  01/04/18 0323 01/05/18 0325 01/06/18 0320  WBC 7.5 8.9 8.6 8.3 10.5  HGB 7.8* 8.5* 8.2* 8.0* 8.3*  HCT 26.4* 28.3* 27.5* 26.2* 27.8*  MCV 84.6 85.0 85.1 85.9 86.3  PLT 168 175 192 204 767   Basic Metabolic Panel: Recent Labs  Lab 12/31/17 0333 01/01/18 1424 01/02/18 0226 01/03/18 0238 01/04/18 0323  NA 138 138 141 138 140  K 4.2 3.8 4.0 3.9 3.9  CL 97* 99* 102 102 104  CO2 31 29 27 25 26   GLUCOSE 177* 242* 222* 174* 167*  BUN 28* 15 13 9 10   CREATININE 1.64* 1.21 1.09 1.01 1.08  CALCIUM 8.1* 8.0* 8.2* 8.2* 8.4*   GFR: Estimated Creatinine Clearance: 51.9 mL/min (by C-G formula based on SCr of 1.08 mg/dL). Liver Function Tests: No results for input(s): AST, ALT, ALKPHOS, BILITOT, PROT, ALBUMIN in the last 168 hours. No results for input(s): LIPASE, AMYLASE in the last 168 hours. No results for input(s): AMMONIA in the last 168 hours. Coagulation Profile: No results for input(s): INR, PROTIME in the last 168 hours. Cardiac Enzymes: No results for input(s): CKTOTAL, CKMB, CKMBINDEX, TROPONINI in the last 168 hours. BNP (last 3 results) No results for input(s): PROBNP in the last 8760 hours.  HbA1C: No results for input(s): HGBA1C in the last 72 hours. CBG: Recent Labs  Lab 01/05/18 1157 01/05/18 1637 01/05/18 2136 01/06/18 0617 01/06/18 1106  GLUCAP 229* 152* 164* 159* 89   Lipid Profile: No results for input(s): CHOL, HDL, LDLCALC, TRIG, CHOLHDL, LDLDIRECT in the last 72 hours. Thyroid Function Tests: No results for input(s): TSH, T4TOTAL, FREET4, T3FREE, THYROIDAB in the last 72 hours. Anemia Panel: No results for input(s): VITAMINB12, FOLATE, FERRITIN, TIBC, IRON, RETICCTPCT in the last 72 hours. Urine analysis:    Component Value Date/Time   COLORURINE YELLOW 12/28/2017 0016   APPEARANCEUR CLOUDY (A) 12/28/2017 0016   LABSPEC 1.026 12/28/2017 0016   PHURINE 5.0 12/28/2017 0016   GLUCOSEU NEGATIVE 12/28/2017 0016   HGBUR NEGATIVE 12/28/2017 0016    BILIRUBINUR NEGATIVE 12/28/2017 0016   KETONESUR NEGATIVE 12/28/2017 0016   PROTEINUR NEGATIVE 12/28/2017 0016   NITRITE NEGATIVE 12/28/2017 0016   LEUKOCYTESUR NEGATIVE 12/28/2017 0016   Sepsis Labs: @LABRCNTIP (procalcitonin:4,lacticidven:4)  ) Recent Results (from the past 240 hour(s))  Surgical pcr screen     Status: Abnormal   Collection Time: 12/27/17  8:59 PM  Result Value Ref Range Status   MRSA, PCR NEGATIVE NEGATIVE Final   Staphylococcus aureus POSITIVE (A) NEGATIVE Final    Comment: (NOTE) The Xpert SA Assay (FDA approved for NASAL specimens in patients 37 years of age and older), is one component of a comprehensive surveillance program. It is not intended to diagnose infection nor to guide or monitor treatment. Performed at [facility], [place] [address], [place], [place] [address]   Aerobic/Anaerobic Culture (surgical/deep wound)     Status: None   Collection Time: 12/29/17  6:11 PM  Result Value Ref Range Status   Specimen Description FLUID LEFT LOWER ABDOMEN  Final   Special Requests PATIENT ON FOLLOWING VANC AND ZOSYN  Final   Gram Stain   Final    MODERATE WBC PRESENT, PREDOMINANTLY PMN NO ORGANISMS SEEN    Culture   Final    FEW STAPHYLOCOCCUS AUREUS NO ANAEROBES ISOLATED Performed at [facility], [place] [address] [place], [place] [address]    Report Status 01/03/2018 FINAL  Final   Organism ID, Bacteria STAPHYLOCOCCUS AUREUS  Final      Susceptibility   Staphylococcus aureus - MIC*    CIPROFLOXACIN <=0.5 SENSITIVE Sensitive     ERYTHROMYCIN <=0.25 SENSITIVE Sensitive     GENTAMICIN <=0.5 SENSITIVE Sensitive     OXACILLIN <=0.25 SENSITIVE Sensitive     TETRACYCLINE <=1 SENSITIVE Sensitive     VANCOMYCIN <=0.5 SENSITIVE Sensitive     TRIMETH/SULFA <=10 SENSITIVE Sensitive     CLINDAMYCIN <=0.25 SENSITIVE Sensitive     RIFAMPIN <=0.5 SENSITIVE Sensitive     Inducible Clindamycin NEGATIVE Sensitive     * FEW STAPHYLOCOCCUS AUREUS       Studies: No results found.  Scheduled Meds: . amiodarone  200 mg Oral Daily  . apixaban  5 mg Oral BID  . bisoprolol  2.5 mg Oral Daily  . DULoxetine  30 mg Oral Daily  . furosemide  40 mg Intravenous Q12H  . insulin aspart  0-5 Units Subcutaneous QHS  . insulin aspart  0-9 Units Subcutaneous TID WC  . insulin glargine  10 Units Subcutaneous QHS  . mouth rinse  15 mL Mouth Rinse BID  . mupirocin ointment   Nasal BID  . pantoprazole  40 mg Oral Daily  . potassium chloride  40 mEq Oral Once  .  pravastatin  40 mg Oral q1800  . senna-docusate  1 tablet Oral BID    Continuous Infusions: . sodium chloride Stopped (01/03/18 1138)  .  ceFAZolin (ANCEF) IV 2 g (01/06/18 1207)     LOS: 11 days     Elmarie Shiley, MD [facility] Pager [phone]  If 7PM-7AM, please contact night-coverage [email or web address] Password [email or web address] 01/06/2018, 1:17 PM

## 2018-01-07 LAB — CBC
HCT: 26.6 % — ABNORMAL LOW (ref 39.0–52.0)
Hemoglobin: 8 g/dL — ABNORMAL LOW (ref 13.0–17.0)
MCH: 25.7 pg — ABNORMAL LOW (ref 26.0–34.0)
MCHC: 30.1 g/dL (ref 30.0–36.0)
MCV: 85.5 fL (ref 78.0–100.0)
Platelets: 222 10*3/uL (ref 150–400)
RBC: 3.11 MIL/uL — ABNORMAL LOW (ref 4.22–5.81)
RDW: 16.6 % — ABNORMAL HIGH (ref 11.5–15.5)
WBC: 9.3 10*3/uL (ref 4.0–10.5)

## 2018-01-07 LAB — BASIC METABOLIC PANEL
Anion gap: 10 (ref 5–15)
BUN: 9 mg/dL (ref 6–20)
CHLORIDE: 103 mmol/L (ref 101–111)
CO2: 27 mmol/L (ref 22–32)
Calcium: 8.6 mg/dL — ABNORMAL LOW (ref 8.9–10.3)
Creatinine, Ser: 1.05 mg/dL (ref 0.61–1.24)
GFR calc Af Amer: >60 mL/min (ref >60)
GFR calc non Af Amer: >60 mL/min (ref >60)
Glucose, Bld: 118 mg/dL — ABNORMAL HIGH (ref 65–99)
Potassium: 4.2 mmol/L (ref 3.5–5.1)
SODIUM: 140 mmol/L (ref 135–145)

## 2018-01-07 LAB — GLUCOSE, CAPILLARY
GLUCOSE-CAPILLARY: 234 mg/dL — AB (ref 65–99)
Glucose-Capillary: 106 mg/dL — ABNORMAL HIGH (ref 65–99)
Glucose-Capillary: 142 mg/dL — ABNORMAL HIGH (ref 65–99)
Glucose-Capillary: 107 mg/dL — ABNORMAL HIGH (ref 65–99)

## 2018-01-07 LAB — MAGNESIUM: MAGNESIUM: 1.5 mg/dL — AB (ref 1.7–2.4)

## 2018-01-07 MED ORDER — MAGNESIUM SULFATE 2 GM/50ML IV SOLN
2.0000 g | Freq: Once | INTRAVENOUS | Status: AC
  Administered 2018-01-07: 2 g via INTRAVENOUS
  Filled 2018-01-07: qty 50

## 2018-01-07 MED ORDER — FERROUS SULFATE 325 (65 FE) MG PO TABS
325.0000 mg | ORAL_TABLET | Freq: Two times a day (BID) | ORAL | Status: DC
  Administered 2018-01-07 – 2018-01-10 (×8): 325 mg via ORAL
  Filled 2018-01-07 (×7): qty 1

## 2018-01-07 MED ORDER — POLYETHYLENE GLYCOL 3350 17 G PO PACK
17.0000 g | PACK | Freq: Every day | ORAL | Status: DC
  Administered 2018-01-07 – 2018-01-10 (×4): 17 g via ORAL
  Filled 2018-01-07 (×4): qty 1

## 2018-01-07 NOTE — Progress Notes (Addendum)
      [address]       [place] [address]             [phone]      4 Days Post-Op Procedure(s) (LRB): TRANSESOPHAGEAL ECHOCARDIOGRAM (TEE) (N/A) Subjective: Feeling better with less SOB, periph edema is somewhat improved as well( diuresing well)  Objective: Vital signs in last 24 hours: Temp:  [97.4 F (36.3 C)-98.6 F (37 C)] 98.1 F (36.7 C) (02/17 0412) Pulse Rate:  [60-68] 62 (02/17 0412) Cardiac Rhythm: Normal sinus rhythm;Bundle branch block;Heart block (02/16 1900) Resp:  [15-19] 16 (02/17 0412) BP: (122-169)/(45-100) 131/65 (02/17 0412) SpO2:  [96 %-100 %] 100 % (02/17 0412) Weight:  [169 lb 4.8 oz (76.8 kg)] 169 lb 4.8 oz (76.8 kg) (02/17 0500)  Hemodynamic parameters for last 24 hours:    Intake/Output from previous day: 02/16 0701 - 02/17 0700 In: 1739.4 [P.O.:582; I.V.:957.4; IV Piggyback:200] Out: 2885 [Urine:2885] Intake/Output this shift: No intake/output data recorded.  General appearance: alert, cooperative and no distress Wound: vac in place- appears stable without erethema, min induration  Lab Results: Recent Labs    01/06/18 0320 01/07/18 0627  WBC 10.5 9.3  HGB 8.3* 8.0*  HCT 27.8* 26.6*  PLT 232 222   BMET:  Recent Labs    01/06/18 1246  NA 139  K 4.2  CL 104  CO2 24  GLUCOSE 132*  BUN 8  CREATININE 1.07  CALCIUM 8.5*    PT/INR: No results for input(s): LABPROT, INR in the last 72 hours. ABG    Component Value Date/Time   PHART 7.475 (H) 12/06/2017 1730   HCO3 27.4 12/06/2017 1730   TCO2 32 03/20/2015 1638   O2SAT 98.1 12/06/2017 1730   CBG (last 3)  Recent Labs    01/06/18 1615 01/06/18 2049 01/07/18 0615  GLUCAP 124* 137* 106*    Meds Scheduled Meds: . amiodarone  200 mg Oral Daily  . apixaban  5 mg Oral BID  . bisoprolol  2.5 mg Oral Daily  . DULoxetine  30 mg Oral Daily  . furosemide  40 mg Intravenous Q12H  . insulin aspart  0-5 Units Subcutaneous QHS  . insulin aspart  0-9 Units  Subcutaneous TID WC  . insulin glargine  10 Units Subcutaneous QHS  . mouth rinse  15 mL Mouth Rinse BID  . mupirocin ointment   Nasal BID  . pantoprazole  40 mg Oral Daily  . pravastatin  40 mg Oral q1800  . senna-docusate  1 tablet Oral BID   Continuous Infusions: . sodium chloride Stopped (01/03/18 1138)  .  ceFAZolin (ANCEF) IV Stopped (01/07/18 0437)   PRN Meds:.acetaminophen **OR** acetaminophen, HYDROcodone-acetaminophen, methocarbamol, MUSCLE RUB, nitroGLYCERIN, ondansetron **OR** ondansetron (ZOFRAN) IV, polyethylene glycol  Xrays No results found.  Assessment/Plan: S/P Procedure(s) (LRB): TRANSESOPHAGEAL ECHOCARDIOGRAM (TEE) (N/A)  1 plan as outlined for wound care/follow up 2 home soon, CHF better today with increase in lasix dose- conts medical management per hospitalist  LOS: 12 days    [name] 01/07/2018 patient examined and medical record reviewed,agree with above note. Tharon Aquas Trigt III 01/07/2018

## 2018-01-07 NOTE — Progress Notes (Signed)
PROGRESS NOTE  [name]:[id number] DOB: 04/30/1936 DOA: 12/26/2017 PCP: Cyndi Bender, PA-C  HPI/Recap of past 24 hours:  Victor [name] is a 82 y.o. year old male with medical history significant for atrial fibrillation jon eliquis, CAD s/p CABG ( 1990), chronic combined systolic/diastolic CHF s/p ICD, history of VT ( 2012), T2DM, CKD Stage3, HTN, HLD who presented on 12/26/2017 with subjective fevers, chills , and increased pustular drainage, redness an pain of recently drained hematoma and was found to have left rectus sheath abdominal wall abscess. Of note patient was previous admitted from 1/13-1/25 for an infected abdominal wall hematoma.  There is initial concern at that time for potential infection however patient remained clinically stable with no signs or symptoms of infection.  Interestingly, superficial wound cultures of hematoma 1/21 grew MSSA.  Patient was discharged after Irrigation and debridement of hematoma with JP drain until 1/25.  Patient was not discharged on antibiotics as it was felt to be an uninfected hematoma around epicardial wires from an abandoned epicardial system without antibiotics.    Patient underwent incision and drainage with irrigation of left upper quadrant abdominal of old AICD pocket and placement of wound VAC during last hospitaliztion.  Since admission he was empirically treated with vancomycin and Zosyn.  Underwent I&D on 2/8 wound VAC placement by CT surgery vancomycin was discontinued on 2/9 due to kidney insufficiency.  Zosyn was switched to cefazolin on 2/10 based on intraoperative sensitivities showing MSSA.  Infectious disease consulted on 2/10 for guidance and antibiotic duration.  ID recommended consulting cardiology for possible TEE to evaluate leads in nature no associated vegetations/rule out endovascular infection related to the old device   Subjective He report breathing is better, but he doesn't feel ready to be discharge.  Lower  extremity edema has improved.    Assessment/Plan: Principal Problem:   Abdominal wall abscess Active Problems:   Insulin dependent type 2 diabetes mellitus, controlled (HCC)   Essential hypertension   GERD (gastroesophageal reflux disease)   Hx of CABG 1990   OSA (obstructive sleep apnea)   Stage 3 chronic kidney disease (HCC)   AKI (acute kidney injury) (HCC)   Atrial fibrillation, persistent (HCC)   COPD (chronic obstructive pulmonary disease) (HCC)   Normocytic anemia   Infected defibrillator (HCC)   MSSA (methicillin susceptible Staphylococcus aureus) infection  MSSA Left rectus sheath abdominal wall abscess related to old epicardial leads status post I&D on 2/8 and wound VAC placement, stable Remains stable, no signs of Sepsis. No bacteremia On cefazolin given MSSA on intraoperative and superficial wound cultures Blood cultures have remained negative -Appreciate ID recommendations, continue cefazolin, needs 2 weeks of antibiotics counting after sx.  -Wound vac in place and functional, CTS following. Continue to monitor.   -TEE negative for pacer wires with no thrombus or vegetation.  Per CVTS plan to discontinue wound Vac on Monday.  Continue with IV antibiotics.   AKI on CKD Stage 3, resolved Creatinine back at baseline.  Peak of 2.3 ( up from baseline of 1.4-1.7)  Suspect related to vancomycin toxicity (Trough on 2/8:17), empiric has since been d/c'd -Monitor BMP -Avoid nephrotoxins  Atrial Fibrillation, rate controlled New onset from previous hospitalization on 11/2017. The previous cardiology plan was for DCCV in 3-4 weeks as an outpatient.  -Continue bisoprolol and Amiodarone -back on eliquis.  Hb stable.   Acute on Chronic Anemia, worsened due to blood loss from abdominal hematoma Remote iron panel shows iron deficiency anemia Baseline hgb  9-10 Presented with infected hematoma s/p ID so some blood loss since hospitalization Hb at 8.5. Monitor  Started oral  iron.   Acute Combined systolic/diastolic CHF (9/92/42 TTE: EF 30-35%), stable Worsening dyspnea on exertion, worsening LE edema.  Continue with  IV lasix 40 mg IV BID.  Monitor renal function.  Daily weight. Strict I and O.  173---169  COPD on home oxygen 3L, stable No wheezing or respiratory distress.   Currently back on home oxygen regimen   T2DM, controlled, A1c 8.6(12/2017) Range 165-225 in last 24 hours Lantus to 10 units, monitor blood glucose, sliding scale  HLD, stable Continue home statin  Hypomagnesemia; Replete IV.   GERD, stable Continue protonix  Code Status: DNR  Family Communication: No family at bedside  Disposition Plan: SNF in 28 hours.    Consultants:  CT surgery  Procedures: 2/10: Incision and drainage with irrigation of abdominal wound old AICD pocket, placement of wound VAC  Antimicrobials:  IV vancomycin 2/5--2/8  IV Zosyn 2/5--2/9  Cefazolin 2/10>>>    Cultures:  Superficial culture 12/27/17: MSSA(pan sensitive)  DVT prophylaxis: Heparin gtt  Objective: Vitals:   01/07/18 0412 01/07/18 0500 01/07/18 0803 01/07/18 0808  BP: 131/65     Pulse: 62     Resp: 16     Temp: 98.1 F (36.7 C)   98 F (36.7 C)  TempSrc: Oral  Oral Oral  SpO2: 100%     Weight:  76.8 kg (169 lb 4.8 oz)    Height:        Intake/Output Summary (Last 24 hours) at 01/07/2018 0851 Last data filed at 01/07/2018 0700 Gross per 24 hour  Intake 1739.37 ml  Output 2885 ml  Net -1145.63 ml   Filed Weights   12/29/17 2011 01/06/18 0521 01/07/18 0500  Weight: 99.1 kg (218 lb 7.6 oz) 78.8 kg (173 lb 11.2 oz) 76.8 kg (169 lb 4.8 oz)    Exam:  Gen. NAD Resp. Normal respiratory effort, Bilateral crackles.  CV. IRRR, S 1, S 2  GI. BS present, soft, mild tenderness, wound vac in place.  Extremities. Plus 2 edema.   Data Reviewed: CBC: Recent Labs  Lab 01/03/18 0238 01/04/18 0323 01/05/18 0325 01/06/18 0320 01/07/18 0627  WBC 8.9 8.6 8.3 10.5 9.3    HGB 8.5* 8.2* 8.0* 8.3* 8.0*  HCT 28.3* 27.5* 26.2* 27.8* 26.6*  MCV 85.0 85.1 85.9 86.3 85.5  PLT 175 192 204 232 683   Basic Metabolic Panel: Recent Labs  Lab 01/01/18 1424 01/02/18 0226 01/03/18 0238 01/04/18 0323 01/06/18 1246  NA 138 141 138 140 139  K 3.8 4.0 3.9 3.9 4.2  CL 99* 102 102 104 104  CO2 29 27 25 26 24   GLUCOSE 242* 222* 174* 167* 132*  BUN 15 13 9 10 8   CREATININE 1.21 1.09 1.01 1.08 1.07  CALCIUM 8.0* 8.2* 8.2* 8.4* 8.5*   GFR: Estimated Creatinine Clearance: 52.3 mL/min (by C-G formula based on SCr of 1.07 mg/dL). Liver Function Tests: No results for input(s): AST, ALT, ALKPHOS, BILITOT, PROT, ALBUMIN in the last 168 hours. No results for input(s): LIPASE, AMYLASE in the last 168 hours. No results for input(s): AMMONIA in the last 168 hours. Coagulation Profile: No results for input(s): INR, PROTIME in the last 168 hours. Cardiac Enzymes: No results for input(s): CKTOTAL, CKMB, CKMBINDEX, TROPONINI in the last 168 hours. BNP (last 3 results) No results for input(s): PROBNP in the last 8760 hours. HbA1C: No results for input(s): HGBA1C in  the last 72 hours. CBG: Recent Labs  Lab 01/06/18 0617 01/06/18 1106 01/06/18 1615 01/06/18 2049 01/07/18 0615  GLUCAP 159* 89 124* 137* 106*   Lipid Profile: No results for input(s): CHOL, HDL, LDLCALC, TRIG, CHOLHDL, LDLDIRECT in the last 72 hours. Thyroid Function Tests: No results for input(s): TSH, T4TOTAL, FREET4, T3FREE, THYROIDAB in the last 72 hours. Anemia Panel: No results for input(s): VITAMINB12, FOLATE, FERRITIN, TIBC, IRON, RETICCTPCT in the last 72 hours. Urine analysis:    Component Value Date/Time   COLORURINE YELLOW 12/28/2017 0016   APPEARANCEUR CLOUDY (A) 12/28/2017 0016   LABSPEC 1.026 12/28/2017 0016   PHURINE 5.0 12/28/2017 0016   GLUCOSEU NEGATIVE 12/28/2017 0016   HGBUR NEGATIVE 12/28/2017 0016   BILIRUBINUR NEGATIVE 12/28/2017 0016   KETONESUR NEGATIVE 12/28/2017 0016    PROTEINUR NEGATIVE 12/28/2017 0016   NITRITE NEGATIVE 12/28/2017 0016   LEUKOCYTESUR NEGATIVE 12/28/2017 0016   Sepsis Labs: @LABRCNTIP (procalcitonin:4,lacticidven:4)  ) Recent Results (from the past 240 hour(s))  Aerobic/Anaerobic Culture (surgical/deep wound)     Status: None   Collection Time: 12/29/17  6:11 PM  Result Value Ref Range Status   Specimen Description FLUID LEFT LOWER ABDOMEN  Final   Special Requests PATIENT ON FOLLOWING VANC AND ZOSYN  Final   Gram Stain   Final    MODERATE WBC PRESENT, PREDOMINANTLY PMN NO ORGANISMS SEEN    Culture   Final    FEW STAPHYLOCOCCUS AUREUS NO ANAEROBES ISOLATED Performed at [facility], [place] [address] [place], [place] [address]    Report Status 01/03/2018 FINAL  Final   Organism ID, Bacteria STAPHYLOCOCCUS AUREUS  Final      Susceptibility   Staphylococcus aureus - MIC*    CIPROFLOXACIN <=0.5 SENSITIVE Sensitive     ERYTHROMYCIN <=0.25 SENSITIVE Sensitive     GENTAMICIN <=0.5 SENSITIVE Sensitive     OXACILLIN <=0.25 SENSITIVE Sensitive     TETRACYCLINE <=1 SENSITIVE Sensitive     VANCOMYCIN <=0.5 SENSITIVE Sensitive     TRIMETH/SULFA <=10 SENSITIVE Sensitive     CLINDAMYCIN <=0.25 SENSITIVE Sensitive     RIFAMPIN <=0.5 SENSITIVE Sensitive     Inducible Clindamycin NEGATIVE Sensitive     * FEW STAPHYLOCOCCUS AUREUS      Studies: No results found.  Scheduled Meds: . amiodarone  200 mg Oral Daily  . apixaban  5 mg Oral BID  . bisoprolol  2.5 mg Oral Daily  . DULoxetine  30 mg Oral Daily  . ferrous sulfate  325 mg Oral BID WC  . furosemide  40 mg Intravenous Q12H  . insulin aspart  0-5 Units Subcutaneous QHS  . insulin aspart  0-9 Units Subcutaneous TID WC  . insulin glargine  10 Units Subcutaneous QHS  . mouth rinse  15 mL Mouth Rinse BID  . mupirocin ointment   Nasal BID  . pantoprazole  40 mg Oral Daily  . polyethylene glycol  17 g Oral Daily  . pravastatin  40 mg Oral q1800  . senna-docusate  1 tablet  Oral BID    Continuous Infusions: . sodium chloride Stopped (01/03/18 1138)  .  ceFAZolin (ANCEF) IV Stopped (01/07/18 0437)     LOS: 12 days     Elmarie Shiley, MD [facility] Pager [phone]  If 7PM-7AM, please contact night-coverage [email or web address] Password [facility] 01/07/2018, 8:51 AM

## 2018-01-08 LAB — GLUCOSE, CAPILLARY
GLUCOSE-CAPILLARY: 255 mg/dL — AB (ref 65–99)
GLUCOSE-CAPILLARY: 219 mg/dL — AB (ref 65–99)
Glucose-Capillary: 98 mg/dL (ref 65–99)
Glucose-Capillary: 121 mg/dL — ABNORMAL HIGH (ref 65–99)

## 2018-01-08 LAB — CBC
HCT: 28.1 % — ABNORMAL LOW (ref 39.0–52.0)
Hemoglobin: 8.5 g/dL — ABNORMAL LOW (ref 13.0–17.0)
MCH: 25.9 pg — ABNORMAL LOW (ref 26.0–34.0)
MCHC: 30.2 g/dL (ref 30.0–36.0)
MCV: 85.7 fL (ref 78.0–100.0)
Platelets: 258 10*3/uL (ref 150–400)
RBC: 3.28 MIL/uL — ABNORMAL LOW (ref 4.22–5.81)
RDW: 16.9 % — ABNORMAL HIGH (ref 11.5–15.5)
WBC: 9.8 10*3/uL (ref 4.0–10.5)

## 2018-01-08 MED ORDER — FUROSEMIDE 10 MG/ML IJ SOLN
40.0000 mg | Freq: Three times a day (TID) | INTRAMUSCULAR | Status: DC
  Administered 2018-01-08 – 2018-01-09 (×3): 40 mg via INTRAVENOUS
  Filled 2018-01-08 (×3): qty 4

## 2018-01-08 MED ORDER — FUROSEMIDE 10 MG/ML IJ SOLN: 40.0000 mg | Freq: Two times a day (BID) | INTRAMUSCULAR | Status: DC

## 2018-01-08 NOTE — Plan of Care (Signed)
Goal due date updated. Kept original goals as set by evaluating PT.   Victor [name], [name]  [phone] 01/08/2018  Acute Rehab PT Goals(only PT should resolve) Pt Will Go Supine/Side To Sit 01/08/2018 1107 by Melvern Banker, PT Flowsheets Taken 01/08/2018 1107  Pt will go Supine/Side to Sit with modified independence Pt Will Go Sit To Supine/Side 01/08/2018 1107 by Melvern Banker, PT Flowsheets Taken 01/08/2018 1107  Pt will go Sit to Supine/Side with modified independence Patient Will Transfer Sit To/From Stand 01/08/2018 1107 by Melvern Banker, PT Flowsheets Taken 01/08/2018 1107  Patient will transfer sit to/from stand with modified independence Pt Will Transfer Bed To Chair/Chair To Bed 01/08/2018 1107 by Melvern Banker, PT Flowsheets Taken 01/08/2018 1107  Pt will Transfer Bed to Chair/Chair to Bed with modified independence Pt Will Ambulate 01/08/2018 1107 by Melvern Banker, PT Flowsheets Taken 01/08/2018 1107  Pt will Ambulate 100 feet;with least restrictive assistive device;with supervision Pt Will Go Up/Down Stairs 01/08/2018 1107 by Melvern Banker, PT Flowsheets Taken 01/08/2018 1107  Pt will Go Up / Down Stairs 3-5 stairs;with supervision;with rail(s)

## 2018-01-08 NOTE — Progress Notes (Addendum)
CSW informed by pt grandson Josh that they would like Geophysicist/field seismologist at first choice- facility updated  Per MD pt not stable for DC today MD also stating that patient talking about going home  CSW met with pt to discuss discharge planning and family concerns about returning home with current weakness and wound care needs.  Patient not interested in SNF at this time states he is sick of meeting strangers and feels as if hospital should keep him till he gets his "sealegs" back- felt as if he was discharged too soon last time.  CSW explained limitations of how long hospital can keep him and purpose of SNF to increase rehab to allow for faster recovery.  Pt frustrated by limitations of home health services through insurance.  Pt motivated to return home to this 3 horses and 3 dogs who his neighbors are caring for at this time.  Pt states that grandson is coming to hospital today and they will have a conversation about discharge planning- is hopeful that he can mobilize today and have a better idea of what is current functioning level is like.  Update: 12pm- grandson called pt and discussed safety concerns with return home- per grandson patient now agreeable to rehab placement despite preference to return home  Jorge Ny, [place] Social Worker [phone]

## 2018-01-08 NOTE — Progress Notes (Signed)
PROGRESS NOTE  Victor [name]:[id number] DOB: [birth year] DOA: 12/26/2017 PCP: Cyndi Bender, PA-C  HPI/Recap of past 24 hours:  Victor [name] is a 82 y.o. year old male with medical history significant for atrial fibrillation jon eliquis, CAD s/p CABG ( 1990), chronic combined systolic/diastolic CHF s/p ICD, history of VT ( 2012), T2DM, CKD Stage3, HTN, HLD who presented on 12/26/2017 with subjective fevers, chills , and increased pustular drainage, redness an pain of recently drained hematoma and was found to have left rectus sheath abdominal wall abscess. Of note patient was previous admitted from 1/13-1/25 for an infected abdominal wall hematoma.  There is initial concern at that time for potential infection however patient remained clinically stable with no signs or symptoms of infection.  Interestingly, superficial wound cultures of hematoma 1/21 grew MSSA.  Patient was discharged after Irrigation and debridement of hematoma with JP drain until 1/25.  Patient was not discharged on antibiotics as it was felt to be an uninfected hematoma around epicardial wires from an abandoned epicardial system without antibiotics.    Patient underwent incision and drainage with irrigation of left upper quadrant abdominal of old AICD pocket and placement of wound VAC during last hospitaliztion.  Since admission he was empirically treated with vancomycin and Zosyn.  Underwent I&D on 2/8 wound VAC placement by CT surgery vancomycin was discontinued on 2/9 due to kidney insufficiency.  Zosyn was switched to cefazolin on 2/10 based on intraoperative sensitivities showing MSSA.  Infectious disease consulted on 2/10 for guidance and antibiotic duration.  ID recommended consulting cardiology for possible TEE to evaluate leads in nature no associated vegetations/rule out endovascular infection related to the old device   Subjective He does not wants to go to SNF.  LE edema improving.  Breathing better, still feels  tired.    Assessment/Plan: Principal Problem:   Abdominal wall abscess Active Problems:   Insulin dependent type 2 diabetes mellitus, controlled (HCC)   Essential hypertension   GERD (gastroesophageal reflux disease)   Hx of CABG 1990   OSA (obstructive sleep apnea)   Stage 3 chronic kidney disease (HCC)   AKI (acute kidney injury) (HCC)   Atrial fibrillation, persistent (HCC)   COPD (chronic obstructive pulmonary disease) (HCC)   Normocytic anemia   Infected defibrillator (HCC)   MSSA (methicillin susceptible Staphylococcus aureus) infection  MSSA Left rectus sheath abdominal wall abscess related to old epicardial leads status post I&D on 2/8 and wound VAC placement, stable. -Remains stable, no signs of Sepsis. No bacteremia. -On cefazolin given MSSA on intraoperative and superficial wound cultures -Blood cultures have remained negative. -Appreciate ID recommendations, continue cefazolin, needs 2 weeks of antibiotics counting after sx.  -TEE negative for pacer wires with no thrombus or vegetation.  -Per CVTS plan to discontinue wound Vac on Monday.  -Continue with IV antibiotics.  -plan to discontinue wound vac.   AKI on CKD Stage 3, resolved Creatinine back at baseline.  Peak of 2.3 ( up from baseline of 1.4-1.7)  Suspect related to vancomycin toxicity (Trough on 2/8:17), empiric has since been d/c'd -Monitor BMP -Avoid nephrotoxins  Atrial Fibrillation, rate controlled New onset from previous hospitalization on 11/2017. The previous cardiology plan was for DCCV in 3-4 weeks as an outpatient.  -Continue bisoprolol and Amiodarone -back on eliquis.  Hb stable.   Acute on Chronic Anemia, worsened due to blood loss from abdominal hematoma Remote iron panel shows iron deficiency anemia Baseline hgb 9-10 Presented with infected hematoma s/p ID so  some blood loss since hospitalization Hb at 8.5. Monitor  Continue with oral iron.   Acute Combined systolic/diastolic CHF  (3/55/73 TTE: EF 30-35%), stable Worsening dyspnea on exertion, worsening LE edema.  Continue with  IV lasix , change to TID Monitor renal function.  Daily weight. Strict I and O.  173---169--166  COPD on home oxygen 3L, stable No wheezing or respiratory distress.   Currently back on home oxygen regimen   T2DM, controlled, A1c 8.6(12/2017) Range 165-225 in last 24 hours Lantus to 10 units, monitor blood glucose, sliding scale  HLD, stable Continue home statin  Hypomagnesemia; Replete IV. Repeat lab in am.   GERD, stable Continue protonix  Code Status: DNR  Family Communication: No family at bedside  Disposition Plan: SNF in 28 hours.    Consultants:  CT surgery  Procedures: 2/10: Incision and drainage with irrigation of abdominal wound old AICD pocket, placement of wound VAC  Antimicrobials:  IV vancomycin 2/5--2/8  IV Zosyn 2/5--2/9  Cefazolin 2/10>>>    Cultures:  Superficial culture 12/27/17: MSSA(pan sensitive)  DVT prophylaxis: Heparin gtt  Objective: Vitals:   01/07/18 2002 01/08/18 0307 01/08/18 0532 01/08/18 0743  BP: (!) 145/61 (!) 142/60  (!) 143/41  Pulse: 63 60  66  Resp: (!) 21 17  17   Temp: 97.8 F (36.6 C) 97.7 F (36.5 C)  (!) 97.5 F (36.4 C)  TempSrc: Oral Oral  Oral  SpO2: 100% 100%  100%  Weight:   75.4 kg (166 lb 3.2 oz)   Height:        Intake/Output Summary (Last 24 hours) at 01/08/2018 0948 Last data filed at 01/08/2018 0747 Gross per 24 hour  Intake 1029 ml  Output 2100 ml  Net -1071 ml   Filed Weights   01/06/18 0521 01/07/18 0500 01/08/18 0532  Weight: 78.8 kg (173 lb 11.2 oz) 76.8 kg (169 lb 4.8 oz) 75.4 kg (166 lb 3.2 oz)    Exam:  Gen. NAD Resp. Normal Respiratory effort. Crackles bases.  CV: S 1, S 2 RRR GI. BS present, soft, wound cover with clean dressing.  Extremities. Plus 2 edema.   Data Reviewed: CBC: Recent Labs  Lab 01/04/18 0323 01/05/18 0325 01/06/18 0320 01/07/18 0627 01/08/18 0219    WBC 8.6 8.3 10.5 9.3 9.8  HGB 8.2* 8.0* 8.3* 8.0* 8.5*  HCT 27.5* 26.2* 27.8* 26.6* 28.1*  MCV 85.1 85.9 86.3 85.5 85.7  PLT 192 204 232 222 220   Basic Metabolic Panel: Recent Labs  Lab 01/02/18 0226 01/03/18 0238 01/04/18 0323 01/06/18 1246 01/07/18 0847  NA 141 138 140 139 140  K 4.0 3.9 3.9 4.2 4.2  CL 102 102 104 104 103  CO2 27 25 26 24 27   GLUCOSE 222* 174* 167* 132* 118*  BUN 13 9 10 8 9   CREATININE 1.09 1.01 1.08 1.07 1.05  CALCIUM 8.2* 8.2* 8.4* 8.5* 8.6*  MG  --   --   --   --  1.5*   GFR: Estimated Creatinine Clearance: 53.3 mL/min (by C-G formula based on SCr of 1.05 mg/dL). Liver Function Tests: No results for input(s): AST, ALT, ALKPHOS, BILITOT, PROT, ALBUMIN in the last 168 hours. No results for input(s): LIPASE, AMYLASE in the last 168 hours. No results for input(s): AMMONIA in the last 168 hours. Coagulation Profile: No results for input(s): INR, PROTIME in the last 168 hours. Cardiac Enzymes: No results for input(s): CKTOTAL, CKMB, CKMBINDEX, TROPONINI in the last 168 hours. BNP (last 3 results)  No results for input(s): PROBNP in the last 8760 hours. HbA1C: No results for input(s): HGBA1C in the last 72 hours. CBG: Recent Labs  Lab 01/07/18 0615 01/07/18 1132 01/07/18 1606 01/07/18 2117 01/08/18 0629  GLUCAP 106* 142* 107* 234* 255*   Lipid Profile: No results for input(s): CHOL, HDL, LDLCALC, TRIG, CHOLHDL, LDLDIRECT in the last 72 hours. Thyroid Function Tests: No results for input(s): TSH, T4TOTAL, FREET4, T3FREE, THYROIDAB in the last 72 hours. Anemia Panel: No results for input(s): VITAMINB12, FOLATE, FERRITIN, TIBC, IRON, RETICCTPCT in the last 72 hours. Urine analysis:    Component Value Date/Time   COLORURINE YELLOW 12/28/2017 0016   APPEARANCEUR CLOUDY (A) 12/28/2017 0016   LABSPEC 1.026 12/28/2017 0016   PHURINE 5.0 12/28/2017 0016   GLUCOSEU NEGATIVE 12/28/2017 0016   HGBUR NEGATIVE 12/28/2017 0016   BILIRUBINUR NEGATIVE  12/28/2017 0016   KETONESUR NEGATIVE 12/28/2017 0016   PROTEINUR NEGATIVE 12/28/2017 0016   NITRITE NEGATIVE 12/28/2017 0016   LEUKOCYTESUR NEGATIVE 12/28/2017 0016   Sepsis Labs: @LABRCNTIP (procalcitonin:4,lacticidven:4)  ) Recent Results (from the past 240 hour(s))  Aerobic/Anaerobic Culture (surgical/deep wound)     Status: None   Collection Time: 12/29/17  6:11 PM  Result Value Ref Range Status   Specimen Description FLUID LEFT LOWER ABDOMEN  Final   Special Requests PATIENT ON FOLLOWING VANC AND ZOSYN  Final   Gram Stain   Final    MODERATE WBC PRESENT, PREDOMINANTLY PMN NO ORGANISMS SEEN    Culture   Final    FEW STAPHYLOCOCCUS AUREUS NO ANAEROBES ISOLATED Performed at [facility], [place] [address] [place], [place] [address]    Report Status 01/03/2018 FINAL  Final   Organism ID, Bacteria STAPHYLOCOCCUS AUREUS  Final      Susceptibility   Staphylococcus aureus - MIC*    CIPROFLOXACIN <=0.5 SENSITIVE Sensitive     ERYTHROMYCIN <=0.25 SENSITIVE Sensitive     GENTAMICIN <=0.5 SENSITIVE Sensitive     OXACILLIN <=0.25 SENSITIVE Sensitive     TETRACYCLINE <=1 SENSITIVE Sensitive     VANCOMYCIN <=0.5 SENSITIVE Sensitive     TRIMETH/SULFA <=10 SENSITIVE Sensitive     CLINDAMYCIN <=0.25 SENSITIVE Sensitive     RIFAMPIN <=0.5 SENSITIVE Sensitive     Inducible Clindamycin NEGATIVE Sensitive     * FEW STAPHYLOCOCCUS AUREUS      Studies: No results found.  Scheduled Meds: . amiodarone  200 mg Oral Daily  . apixaban  5 mg Oral BID  . bisoprolol  2.5 mg Oral Daily  . DULoxetine  30 mg Oral Daily  . ferrous sulfate  325 mg Oral BID WC  . furosemide  40 mg Intravenous Q8H  . insulin aspart  0-5 Units Subcutaneous QHS  . insulin aspart  0-9 Units Subcutaneous TID WC  . insulin glargine  10 Units Subcutaneous QHS  . mouth rinse  15 mL Mouth Rinse BID  . mupirocin ointment   Nasal BID  . pantoprazole  40 mg Oral Daily  . polyethylene glycol  17 g Oral Daily  .  pravastatin  40 mg Oral q1800  . senna-docusate  1 tablet Oral BID    Continuous Infusions: . sodium chloride Stopped (01/03/18 1138)  .  ceFAZolin (ANCEF) IV Stopped (01/08/18 0545)     LOS: 13 days     Elmarie Shiley, MD [facility] Pager [phone]  If 7PM-7AM, please contact night-coverage [email or web address] Password [facility] 01/08/2018, 9:48 AM

## 2018-01-08 NOTE — Progress Notes (Addendum)
      [address]       [place] [address]             [phone]      5 Days Post-Op Procedure(s) (LRB): TRANSESOPHAGEAL ECHOCARDIOGRAM (TEE) (N/A)   Subjective:  Continues to have pain at abdominal wound site.  Also notices his swelling is somewhat improved.  He wants wound vac off so he can get up and moved around.  Objective: Vital signs in last 24 hours: Temp:  [97.5 F (36.4 C)-98 F (36.7 C)] 97.5 F (36.4 C) (02/18 0743) Pulse Rate:  [60-68] 66 (02/18 0743) Cardiac Rhythm: Normal sinus rhythm;Bundle branch block;Heart block (02/17 1900) Resp:  [17-21] 17 (02/18 0743) BP: (123-145)/(41-64) 143/41 (02/18 0743) SpO2:  [100 %] 100 % (02/18 0743) Weight:  [166 lb 3.2 oz (75.4 kg)] 166 lb 3.2 oz (75.4 kg) (02/18 0532)  Intake/Output from previous day: 02/17 0701 - 02/18 0700 In: 1149 [P.O.:819; I.V.:30; IV Piggyback:300] Out: 1825 [Urine:1800; Drains:25] Intake/Output this shift: Total I/O In: -  Out: 275 [Urine:275]  General appearance: alert, cooperative and no distress Heart: irregularly irregular rhythm Lungs: clear to auscultation bilaterally Extremities: edema 2+ pitting Wound: clean, yellow slough/grannulation tissue present in wound  Lab Results: Recent Labs    01/07/18 0627 01/08/18 0219  WBC 9.3 9.8  HGB 8.0* 8.5*  HCT 26.6* 28.1*  PLT 222 258   BMET:  Recent Labs    01/06/18 1246 01/07/18 0847  NA 139 140  K 4.2 4.2  CL 104 103  CO2 24 27  GLUCOSE 132* 118*  BUN 8 9  CREATININE 1.07 1.05  CALCIUM 8.5* 8.6*    PT/INR: No results for input(s): LABPROT, INR in the last 72 hours. ABG    Component Value Date/Time   PHART 7.475 (H) 12/06/2017 1730   HCO3 27.4 12/06/2017 1730   TCO2 32 03/20/2015 1638   O2SAT 98.1 12/06/2017 1730   CBG (last 3)  Recent Labs    01/07/18 1606 01/07/18 2117 01/08/18 0629  GLUCAP 107* 234* 255*    Assessment/Plan: S/P Procedure(s) (LRB): TRANSESOPHAGEAL ECHOCARDIOGRAM (TEE)  (N/A)  1. Abdominal wound- removed wound vac, packed with wet to dry dressings that will need to be changed daily 2. CV- CHF, lower extremity edema improving with diuretics, continue per medicine 3. Dispo- continue current care, orders placed for daily wet to dry dressing changes    LOS: 13 days   Ellwood Handler 01/08/2018 patient examined and medical record reviewed,agree with above note. Tharon Aquas Trigt III 01/08/2018

## 2018-01-08 NOTE — Progress Notes (Signed)
Physical Therapy Treatment Patient Details Name: Victor [name] MRN: [id number] DOB: 03-18-1936 Today's Date: 01/08/2018    History of Present Illness 82yo male with history of recent admission for I&D of hematoma and JP drain placement secondary to fluid surrounding epicardial wires of abandoned epicardial system, also found to have A-fib during this stay. JP drain was removed on 12/15/17. He then discharged home but noticed increased redness and pain at the drain site, wound continued to progress and eventually began to have puss flowing out of it. Diagnosed with L rectus sheath abdominal wall abscess. Received I&D, wound vac placement on 12/29/17. PMH A-fib, OA, cardiomyopathy, CHF, CKD, COPD, CAD, HTN, hx home O2 use, hx orthostatic hypotension, hx V-tach, PVD, CVA, TIA, DM, hx cardiac cath, hx CABG, ICD placement     PT Comments    Pt performed decreased activity and refused OOB mobility despite education and encouragement.  Pt did agree to supine therapeutic exercises.  Post session continued to educate on the importance of functional mobility and need to perform stair training next session to ensure safe entry into home.  Pt reports he will try next session.  Will f/u pending d/c plans for stair training.     Follow Up Recommendations  Home health PT;Supervision/Assistance - 24 hour     Equipment Recommendations  None recommended by PT    Recommendations for Other Services       Precautions / Restrictions Precautions Precautions: Fall;Other (comment) Precaution Comments: WOUND vac D/C 01-08-18 Restrictions Weight Bearing Restrictions: No    Mobility  Bed Mobility               General bed mobility comments: Pt refused to sit side of bed or move OOB and he reports he is having a "tough" day and having pain with urination.  Pt however was agreeable to supine exercises.    Transfers                    Ambulation/Gait                 Stairs             Wheelchair Mobility    Modified Rankin (Stroke Patients Only)       Balance                                            Cognition Arousal/Alertness: Awake/alert Behavior During Therapy: WFL for tasks assessed/performed Overall Cognitive Status: Within Functional Limits for tasks assessed                                        Exercises General Exercises - Lower Extremity Ankle Circles/Pumps: AROM;Both;20 reps;Supine Quad Sets: AROM;Both;10 reps;Supine Heel Slides: AROM;Both;10 reps;Supine Hip ABduction/ADduction: AROM;Both;10 reps;Supine Straight Leg Raises: AROM;Both;10 reps;Supine    General Comments        Pertinent Vitals/Pain Pain Assessment: No/denies pain Pain Location: Denies pain during session but reports he has been having pain when urinating and reported pain as sharp, informed RN of complaint.   Pain Intervention(s): Monitored during session;Repositioned    Home Living                      Prior Function  PT Goals (current goals can now be found in the care plan section) Acute Rehab PT Goals Patient Stated Goal: to go home Time For Goal Achievement: 01/22/18(spoke with supervising PT who updated goals and reported to add two weeks to goal achievement date.  ) Potential to Achieve Goals: Good Progress towards PT goals: Not progressing toward goals - comment(due to refusal of OOB mobility unable to assess.  )    Frequency    Min 3X/week      PT Plan Current plan remains appropriate    Co-evaluation              AM-PAC PT "6 Clicks" Daily Activity  Outcome Measure  Difficulty turning over in bed (including adjusting bedclothes, sheets and blankets)?: None Difficulty moving from lying on back to sitting on the side of the bed? : None Difficulty sitting down on and standing up from a chair with arms (e.g., wheelchair, bedside commode, etc,.)?: Unable Help needed moving to and from a  bed to chair (including a wheelchair)?: A Little Help needed walking in hospital room?: A Little Help needed climbing 3-5 steps with a railing? : A Little 6 Click Score: 18    End of Session Equipment Utilized During Treatment: Oxygen Activity Tolerance: Patient tolerated treatment well Patient left: in bed;with call bell/phone within reach;with bed alarm set;with nursing/sitter in room Nurse Communication: (informed nurse of c/o pain with urination and refusal of OOB mobility) PT Visit Diagnosis: Other abnormalities of gait and mobility (R26.89);Muscle weakness (generalized) (M62.81)     Time: 5784-6962 PT Time Calculation (min) (ACUTE ONLY): 10 min  Charges:  $Therapeutic Exercise: 8-22 mins                    G Codes:       Governor Rooks, PTA pager [phone]    Cristela Blue 01/08/2018, 2:54 PM

## 2018-01-08 NOTE — [place] FL2 (Signed)
[place] MEDICAID FL2 LEVEL OF CARE SCREENING TOOL     IDENTIFICATION  Patient Name: Victor [name] Birthdate: 22-Apr-1936 Sex: male Admission Date (Current Location): 12/26/2017  [facility] and [place] Number:  Herbalist and Address:  [facility] [place], [place] [address], [place], [place] [address]      Provider Number: [id number]  Attending Physician Name and Address:  Elmarie Shiley, MD  Relative Name and Phone Number:       Current Level of Care: Hospital Recommended Level of Care: [place] Prior Approval Number:    Date Approved/Denied:   PASRR Number: [id number]  Discharge Plan: Home    Current Diagnoses: Patient Active Problem List   Diagnosis Date Noted  . Normocytic anemia 01/01/2018  . Infected defibrillator ([place])   . MSSA (methicillin susceptible Staphylococcus aureus) infection   . Abdominal wall abscess 12/26/2017  . Atrial fibrillation, persistent ([place]) 12/15/2017  . DNR (do not resuscitate) 12/15/2017  . Debilitated 12/15/2017  . COPD (chronic obstructive pulmonary disease) ([place]) 12/15/2017  . Leukocytosis 12/06/2017  . Abdominal wall fluid collections 12/04/2017  . NSTEMI (non-ST elevated myocardial infarction) ([place]) 11/08/2017  . Elevated troponin 11/08/2017  . Demand ischemia ([place])   . Acute on chronic combined systolic and diastolic CHF (congestive heart failure) ([place])   . Chest pain   . AKI (acute kidney injury) ([place]) 07/16/2017  . Hyponatremia 07/16/2017  . Stage 3 chronic kidney disease ([place]) 10/27/2016  . Orthostatic hypotension 09/16/2016  . Emphysema of lung ([place]) 12/23/2015  . OSA (obstructive sleep apnea)   . Near syncope 06/28/2015  . GERD (gastroesophageal reflux disease)   . Hx of CABG 1990   . Essential hypertension 10/02/2014  . TIA (transient ischemic attack) 10/02/2014  . Microcytic anemia 04/25/2013  . Acute on chronic systolic CHF (congestive heart failure) ([place]) 04/24/2013  .  Insulin dependent type 2 diabetes mellitus, controlled ([place]) 07/25/2009  . Cardiomyopathy, ischemic 07/25/2009  . VENTRICULAR TACHYCARDIA 07/25/2009  . Peripheral vascular disease ([place]) 07/25/2009  . Osteoarthritis 07/25/2009  . Arthropathy 07/25/2009  . BACK PAIN 07/25/2009  . Automatic implantable cardioverter-defibrillator in situ 07/25/2009    Orientation RESPIRATION BLADDER Height & Weight     Time, Self, Situation, Place  O2(3L [place]) Continent Weight: 166 lb 3.2 oz (75.4 kg) Height:  5\' 7"  (170.2 cm)  BEHAVIORAL SYMPTOMS/MOOD NEUROLOGICAL BOWEL NUTRITION STATUS      Continent Diet(carb modified; cardiac)  AMBULATORY STATUS COMMUNICATION OF NEEDS Skin   Limited Assist Verbally Surgical wounds(daily wet to dry packing)                       Personal Care Assistance Level of Assistance  Bathing, Feeding, Dressing Bathing Assistance: Limited assistance Feeding assistance: Independent Dressing Assistance: Limited assistance     Functional Limitations Info             SPECIAL CARE FACTORS FREQUENCY  PT (By licensed PT), OT (By licensed OT)     PT Frequency: 5/wk OT Frequency: 5/wk            Contractures      Additional Factors Info  Code Status, Allergies Code Status Info: DNR Allergies Info: Coreg Carvedilol, Lovastatin, Ativan Lorazepam           Current Medications (01/08/2018):  This is the current hospital active medication list Current Facility-Administered Medications  Medication Dose Route Frequency Provider Last Rate Last Dose  . 0.9 %  sodium  chloride infusion   Intravenous Continuous Annye Asa, MD   Stopped at 01/03/18 1138  . acetaminophen (TYLENOL) tablet 650 mg  650 mg Oral Q6H PRN Rai, Ripudeep K, MD       Or  . acetaminophen (TYLENOL) suppository 650 mg  650 mg Rectal Q6H PRN Rai, Ripudeep K, MD      . amiodarone (PACERONE) tablet 200 mg  200 mg Oral Daily Rai, Ripudeep K, MD   200 mg at 01/08/18 0923  . apixaban (ELIQUIS)  tablet 5 mg  5 mg Oral BID Regalado, Belkys A, MD   5 mg at 01/08/18 0923  . bisoprolol (ZEBETA) tablet 2.5 mg  2.5 mg Oral Daily Rai, Ripudeep K, MD   2.5 mg at 01/08/18 0925  . ceFAZolin (ANCEF) IVPB 2g/100 mL premix  2 g Intravenous Q8H Desiree Hane, MD   Stopped at 01/08/18 0545  . DULoxetine (CYMBALTA) DR capsule 30 mg  30 mg Oral Daily Rai, Ripudeep K, MD   30 mg at 01/08/18 0923  . ferrous sulfate tablet 325 mg  325 mg Oral BID WC Regalado, Belkys A, MD   325 mg at 01/08/18 0924  . furosemide (LASIX) injection 40 mg  40 mg Intravenous Q8H Regalado, Belkys A, MD      . HYDROcodone-acetaminophen (NORCO/VICODIN) 5-325 MG per tablet 1-2 tablet  1-2 tablet Oral Q4H PRN Rai, Vernelle Emerald, MD   2 tablet at 01/08/18 0937  . insulin aspart (novoLOG) injection 0-5 Units  0-5 Units Subcutaneous QHS Oretha Milch D, MD   2 Units at 01/07/18 2130  . insulin aspart (novoLOG) injection 0-9 Units  0-9 Units Subcutaneous TID WC Oretha Milch D, MD   5 Units at 01/08/18 [phone]  . insulin glargine (LANTUS) injection 10 Units  10 Units Subcutaneous QHS Oretha Milch D, MD   10 Units at 01/07/18 2131  . MEDLINE mouth rinse  15 mL Mouth Rinse BID Alma Friendly, MD   15 mL at 01/08/18 0924  . methocarbamol (ROBAXIN) tablet 750 mg  750 mg Oral Q8H PRN Rai, Ripudeep K, MD   750 mg at 01/08/18 0937  . mupirocin ointment (BACTROBAN) 2 %   Nasal BID Oretha Milch D, MD      . nitroGLYCERIN (NITROSTAT) SL tablet 0.4 mg  0.4 mg Sublingual Q5 min PRN Rai, Ripudeep K, MD      . ondansetron (ZOFRAN) tablet 4 mg  4 mg Oral Q6H PRN Rai, Ripudeep K, MD       Or  . ondansetron (ZOFRAN) injection 4 mg  4 mg Intravenous Q6H PRN Rai, Ripudeep K, MD   4 mg at 01/05/18 1513  . pantoprazole (PROTONIX) EC tablet 40 mg  40 mg Oral Daily Rai, Ripudeep K, MD   40 mg at 01/08/18 0924  . polyethylene glycol (MIRALAX / GLYCOLAX) packet 17 g  17 g Oral Daily Regalado, Belkys A, MD   17 g at 01/08/18 0924  . pravastatin  (PRAVACHOL) tablet 40 mg  40 mg Oral q1800 Rai, Ripudeep K, MD   40 mg at 01/07/18 1719  . senna-docusate (Senokot-S) tablet 1 tablet  1 tablet Oral BID Rai, Ripudeep K, MD   1 tablet at 01/08/18 1829     Discharge Medications: Please see discharge summary for a list of discharge medications.  Relevant Imaging Results:  Relevant Lab Results:   Additional Information SS#: [id number]  Jorge Ny, LCSW

## 2018-01-09 LAB — BASIC METABOLIC PANEL
Anion gap: 13 (ref 5–15)
BUN: 11 mg/dL (ref 6–20)
CHLORIDE: 97 mmol/L — AB (ref 101–111)
CO2: 29 mmol/L (ref 22–32)
Calcium: 8.6 mg/dL — ABNORMAL LOW (ref 8.9–10.3)
Creatinine, Ser: 1.13 mg/dL (ref 0.61–1.24)
GFR calc Af Amer: >60 mL/min (ref >60)
GFR calc non Af Amer: 60 mL/min — ABNORMAL LOW (ref >60)
GLUCOSE: 132 mg/dL — AB (ref 65–99)
POTASSIUM: 4.6 mmol/L (ref 3.5–5.1)
Sodium: 139 mmol/L (ref 135–145)

## 2018-01-09 LAB — GLUCOSE, CAPILLARY
GLUCOSE-CAPILLARY: 156 mg/dL — AB (ref 65–99)
Glucose-Capillary: 175 mg/dL — ABNORMAL HIGH (ref 65–99)
Glucose-Capillary: 147 mg/dL — ABNORMAL HIGH (ref 65–99)
Glucose-Capillary: 144 mg/dL — ABNORMAL HIGH (ref 65–99)

## 2018-01-09 LAB — MAGNESIUM: MAGNESIUM: 1.6 mg/dL — AB (ref 1.7–2.4)

## 2018-01-09 LAB — CBC
HCT: 29.1 % — ABNORMAL LOW (ref 39.0–52.0)
Hemoglobin: 8.9 g/dL — ABNORMAL LOW (ref 13.0–17.0)
MCH: 25.9 pg — ABNORMAL LOW (ref 26.0–34.0)
MCHC: 30.6 g/dL (ref 30.0–36.0)
MCV: 84.8 fL (ref 78.0–100.0)
Platelets: 221 10*3/uL (ref 150–400)
RBC: 3.43 MIL/uL — ABNORMAL LOW (ref 4.22–5.81)
RDW: 17.3 % — ABNORMAL HIGH (ref 11.5–15.5)
WBC: 9.9 10*3/uL (ref 4.0–10.5)

## 2018-01-09 MED ORDER — MAGNESIUM SULFATE 2 GM/50ML IV SOLN
2.0000 g | Freq: Once | INTRAVENOUS | Status: AC
  Administered 2018-01-09: 2 g via INTRAVENOUS
  Filled 2018-01-09: qty 50

## 2018-01-09 MED ORDER — FUROSEMIDE 40 MG PO TABS
40.0000 mg | ORAL_TABLET | Freq: Two times a day (BID) | ORAL | Status: DC
  Administered 2018-01-09 – 2018-01-10 (×3): 40 mg via ORAL
  Filled 2018-01-09 (×3): qty 1

## 2018-01-09 NOTE — Progress Notes (Signed)
Physical Therapy Treatment Patient Details Name: Victor [name] MRN: [id number] DOB: 11-25-1935 Today's Date: 01/09/2018    History of Present Illness 82yo male with history of recent admission for I&D of hematoma and JP drain placement secondary to fluid surrounding epicardial wires of abandoned epicardial system, also found to have A-fib during this stay. JP drain was removed on 12/15/17. He then discharged home but noticed increased redness and pain at the drain site, wound continued to progress and eventually began to have puss flowing out of it. Diagnosed with L rectus sheath abdominal wall abscess. Received I&D, wound vac placement on 12/29/17. PMH A-fib, OA, cardiomyopathy, CHF, CKD, COPD, CAD, HTN, hx home O2 use, hx orthostatic hypotension, hx V-tach, PVD, CVA, TIA, DM, hx cardiac cath, hx CABG, ICD placement     PT Comments    Pt making slow progress with mobility and will not have needed support at home. Feel pt can benefit from ST-SNF prior to return home. Pt in agreement.   Follow Up Recommendations  SNF     Equipment Recommendations  None recommended by PT    Recommendations for Other Services       Precautions / Restrictions Precautions Precautions: Fall Restrictions Weight Bearing Restrictions: No    Mobility  Bed Mobility Overal bed mobility: Needs Assistance Bed Mobility: Sit to Supine         Sit to sidelying: Supervision General bed mobility comments: Incr time and effort  Transfers Overall transfer level: Needs assistance Equipment used: Rolling walker (2 wheeled) Transfers: Sit to/from Stand Sit to Stand: Supervision         General transfer comment: Supervision for safety and lines  Ambulation/Gait Ambulation/Gait assistance: Supervision Ambulation Distance (Feet): 130 Feet Assistive device: Rolling walker (2 wheeled) Gait Pattern/deviations: Step-through pattern;Decreased stride length;Trunk flexed Gait velocity: Decreased Gait velocity  interpretation: Below normal speed for age/gender General Gait Details: Supervision for safety. Verbal cues for posture   Stairs            Wheelchair Mobility    Modified Rankin (Stroke Patients Only)       Balance Overall balance assessment: Needs assistance Sitting-balance support: Bilateral upper extremity supported;Feet supported Sitting balance-Leahy Scale: Normal     Standing balance support: Bilateral upper extremity supported;During functional activity Standing balance-Leahy Scale: Poor Standing balance comment: UE support and supervision for static standing                            Cognition Arousal/Alertness: Awake/alert Behavior During Therapy: WFL for tasks assessed/performed Overall Cognitive Status: Within Functional Limits for tasks assessed                                        Exercises      General Comments        Pertinent Vitals/Pain Pain Assessment: No/denies pain Faces Pain Scale: Hurts even more Pain Location: neck and upper back Pain Descriptors / Indicators: Sore Pain Intervention(s): Monitored during session;Limited activity within patient's tolerance;Repositioned;Premedicated before session    Home Living                      Prior Function            PT Goals (current goals can now be found in the care plan section) Progress towards PT goals: Progressing toward goals  Frequency    Min 2X/week      PT Plan Discharge plan needs to be updated;Frequency needs to be updated    Co-evaluation              AM-PAC PT "6 Clicks" Daily Activity  Outcome Measure  Difficulty turning over in bed (including adjusting bedclothes, sheets and blankets)?: None Difficulty moving from lying on back to sitting on the side of the bed? : None Difficulty sitting down on and standing up from a chair with arms (e.g., wheelchair, bedside commode, etc,.)?: Unable Help needed moving to and from  a bed to chair (including a wheelchair)?: A Little Help needed walking in hospital room?: A Little Help needed climbing 3-5 steps with a railing? : A Little 6 Click Score: 18    End of Session Equipment Utilized During Treatment: Oxygen Activity Tolerance: Patient tolerated treatment well Patient left: in bed;with call bell/phone within reach;with nursing/sitter in room Nurse Communication: Mobility status PT Visit Diagnosis: Other abnormalities of gait and mobility (R26.89);Muscle weakness (generalized) (M62.81)     Time: 7001-7494 PT Time Calculation (min) (ACUTE ONLY): 21 min  Charges:  $Gait Training: 8-22 mins                    G Codes:       [facility] PT [place] 01/09/2018, 1:52 PM

## 2018-01-09 NOTE — Progress Notes (Addendum)
      [address]       ,[place] [address]             [phone]      6 Days Post-Op Procedure(s) (LRB): TRANSESOPHAGEAL ECHOCARDIOGRAM (TEE) (N/A) Subjective: No issues overnight. Patient states that the dressing changings are going well. No pain with the changes.   Objective: Vital signs in last 24 hours: Temp:  [97.6 F (36.4 C)-98.2 F (36.8 C)] 97.6 F (36.4 C) (02/19 0357) Pulse Rate:  [59-63] 63 (02/19 0357) Cardiac Rhythm: Heart block (02/19 0700) Resp:  [14-20] 20 (02/19 0357) BP: (147-162)/(44-68) 162/68 (02/19 0357) SpO2:  [100 %] 100 % (02/19 0357) Weight:  [161 lb 12.8 oz (73.4 kg)] 161 lb 12.8 oz (73.4 kg) (02/19 0357)     Intake/Output from previous day: 02/18 0701 - 02/19 0700 In: 775 [P.O.:465; I.V.:110; IV Piggyback:200] Out: 3150 [Urine:3150] Intake/Output this shift: No intake/output data recorded.  General appearance: alert, cooperative and no distress Heart: regular rate and rhythm, S1, S2 normal, no murmur, click, rub or gallop Lungs: clear to auscultation bilaterally Abdomen: soft, non-tender; bowel sounds normal; no masses,  no organomegaly Extremities: extremities normal, atraumatic, no cyanosis or edema Wound: packed wet to dry  Lab Results: Recent Labs    01/08/18 0219 01/09/18 0230  WBC 9.8 9.9  HGB 8.5* 8.9*  HCT 28.1* 29.1*  PLT 258 221   BMET:  Recent Labs    01/07/18 0847 01/09/18 0230  NA 140 139  K 4.2 4.6  CL 103 97*  CO2 27 29  GLUCOSE 118* 132*  BUN 9 11  CREATININE 1.05 1.13  CALCIUM 8.6* 8.6*    PT/INR: No results for input(s): LABPROT, INR in the last 72 hours. ABG    Component Value Date/Time   PHART 7.475 (H) 12/06/2017 1730   HCO3 27.4 12/06/2017 1730   TCO2 32 03/20/2015 1638   O2SAT 98.1 12/06/2017 1730   CBG (last 3)  Recent Labs    01/08/18 1624 01/08/18 2044 01/09/18 0555  GLUCAP 219* 121* 175*    Assessment/Plan: S/P Procedure(s) (LRB): TRANSESOPHAGEAL  ECHOCARDIOGRAM (TEE) (N/A)   1. Abdominal wound- packed with wet to dry dressings that will need to be changed daily 2. CV- CHF, lower extremity edema improving with diuretics, continue per medicine 3. Dispo- continue current care, orders placed for daily wet to dry dressing changes 4. Pain is well controlled    LOS: 14 days    Elgie Collard 01/09/2018 patient examined and medical record reviewed,agree with above note. Tharon Aquas Trigt III 01/09/2018

## 2018-01-09 NOTE — Progress Notes (Signed)
[name]:[id number] DOB: 1936-01-04 DOA: 12/26/2017 PCP: Cyndi Bender, PA-C  HPI/Recap of past 24 hours:  Victor [name] is a 82 y.o. year old male with medical history significant for atrial fibrillation jon eliquis, CAD s/p CABG ( 1990), chronic combined systolic/diastolic CHF s/p ICD, history of VT ( 2012), T2DM, CKD Stage3, HTN, HLD who presented on 12/26/2017 with subjective fevers, chills , and increased pustular drainage, redness an pain of recently drained hematoma and was found to have left rectus sheath abdominal wall abscess. Of note patient was previous admitted from 1/13-1/25 for an infected abdominal wall hematoma.  There is initial concern at that time for potential infection however patient remained clinically stable with no signs or symptoms of infection.  Interestingly, superficial wound cultures of hematoma 1/21 grew MSSA.  Patient was discharged after Irrigation and debridement of hematoma with JP drain until 1/25.  Patient was not discharged on antibiotics as it was felt to be an uninfected hematoma around epicardial wires from an abandoned epicardial system without antibiotics.    Patient underwent incision and drainage with irrigation of left upper quadrant abdominal of old AICD pocket and placement of wound VAC during last hospitaliztion.  Since admission he was empirically treated with vancomycin and Zosyn.  Underwent I&D on 2/8 wound VAC placement by CT surgery vancomycin was discontinued on 2/9 due to kidney insufficiency.  Zosyn was switched to cefazolin on 2/10 based on intraoperative sensitivities showing MSSA.  Infectious disease consulted on 2/10 for guidance and antibiotic duration.  ID recommended consulting cardiology for possible TEE to evaluate leads in nature no associated vegetations/rule out endovascular infection related to the old device. TEE negative for vegetation. Plan is for total 2 week of antibiotics after sx.  Patient develops  worsening dyspnea and LE edema. He was started on IV lasix. Diuresing well. Plan to change lasix to oral today.    Subjective He is not feeling well today. He is complaining of neck pain, feet pain. He doesn't feel ready today to be discharge.  Breathing ok.    Assessment/Plan: Principal Problem:   Abdominal wall abscess Active Problems:   Insulin dependent type 2 diabetes mellitus, controlled (HCC)   Essential hypertension   GERD (gastroesophageal reflux disease)   Hx of CABG 1990   OSA (obstructive sleep apnea)   Stage 3 chronic kidney disease (HCC)   AKI (acute kidney injury) (HCC)   Atrial fibrillation, persistent (HCC)   COPD (chronic obstructive pulmonary disease) (HCC)   Normocytic anemia   Infected defibrillator (HCC)   MSSA (methicillin susceptible Staphylococcus aureus) infection  MSSA Left rectus sheath abdominal wall abscess related to old epicardial leads status post I&D on 2/8 and wound VAC placement, stable. -Remains stable, no signs of Sepsis. No bacteremia. -On cefazolin given MSSA on intraoperative and superficial wound cultures -Blood cultures have remained negative. -Appreciate ID recommendations, continue cefazolin, needs 2 weeks of antibiotics counting after sx.  -TEE negative for pacer wires with no thrombus or vegetation.  -Per CVTS plan to discontinue wound Vac on Monday.  -Continue with IV antibiotics.  -plan to discontinue wound vac.  -Plan to be discharge on Keflex 500 mg QID. Need to complete 2 weeks post sx.   AKI on CKD Stage 3, resolved Creatinine back at baseline.  Peak of 2.3 ( up from baseline of 1.4-1.7)  Suspect related to vancomycin toxicity (Trough on 2/8:17), empiric has since been d/c'd -Monitor BMP -Avoid nephrotoxins  Atrial Fibrillation, rate controlled  New onset from previous hospitalization on 11/2017. The previous cardiology plan was for DCCV in 3-4 weeks as an outpatient.  -Continue bisoprolol and Amiodarone -back on  eliquis.  Hb stable.   Acute on Chronic Anemia, worsened due to blood loss from abdominal hematoma Remote iron panel shows iron deficiency anemia Baseline hgb 9-10 Presented with infected hematoma s/p ID so some blood loss since hospitalization Hb at 8.5. Monitor  Continue with oral iron.   Acute Combined systolic/diastolic CHF (6/57/84 TTE: EF 30-35%), stable Worsening dyspnea on exertion, worsening LE edema.  Will change IV lasix to oral 40 mg BID>  Monitor renal function.  Daily weight. Strict I and O.  173---169--166--161 Observed on oral lasix.   COPD on home oxygen 3L, stable No wheezing or respiratory distress.   Currently back on home oxygen regimen   T2DM, controlled, A1c 8.6(12/2017) Range 165-225 in last 24 hours Lantus to 10 units, monitor blood glucose, sliding scale  HLD, stable Continue home statin  Hypomagnesemia;  replete IV. Repeat labs in am   GERD, stable Continue protonix  Code Status: DNR  Family Communication: No family at bedside  Disposition Plan: SNF hopefully 2-20.   Consultants:  CT surgery  Procedures: 2/10: Incision and drainage with irrigation of abdominal wound old AICD pocket, placement of wound VAC  Antimicrobials:  IV vancomycin 2/5--2/8  IV Zosyn 2/5--2/9  Cefazolin 2/10>>>    Cultures:  Superficial culture 12/27/17: MSSA(pan sensitive)  DVT prophylaxis: Heparin gtt  Objective: Vitals:   01/08/18 0743 01/08/18 2037 01/09/18 0357 01/09/18 0900  BP: (!) 143/41 (!) 147/44 (!) 162/68   Pulse: 66 (!) 59 63   Resp: 17 14 20 19   Temp: (!) 97.5 F (36.4 C) 98.2 F (36.8 C) 97.6 F (36.4 C)   TempSrc: Oral Oral Oral   SpO2: 100% 100% 100% 100%  Weight:   73.4 kg (161 lb 12.8 oz)   Height:        Intake/Output Summary (Last 24 hours) at 01/09/2018 1053 Last data filed at 01/09/2018 1013 Gross per 24 hour  Intake 1015 ml  Output 3700 ml  Net -2685 ml   Filed Weights   01/07/18 0500 01/08/18 0532 01/09/18 0357   Weight: 76.8 kg (169 lb 4.8 oz) 75.4 kg (166 lb 3.2 oz) 73.4 kg (161 lb 12.8 oz)    Exam:  Gen. NAD Resp. Normal respiratory effort, CTA CV: S 1, S 2 RRR GI. Bs present, soft, wound with clean dressing.  Extremities. Plus 1 edema.   Data Reviewed: CBC: Recent Labs  Lab 01/05/18 0325 01/06/18 0320 01/07/18 0627 01/08/18 0219 01/09/18 0230  WBC 8.3 10.5 9.3 9.8 9.9  HGB 8.0* 8.3* 8.0* 8.5* 8.9*  HCT 26.2* 27.8* 26.6* 28.1* 29.1*  MCV 85.9 86.3 85.5 85.7 84.8  PLT 204 232 222 258 696   Basic Metabolic Panel: Recent Labs  Lab 01/03/18 0238 01/04/18 0323 01/06/18 1246 01/07/18 0847 01/09/18 0230  NA 138 140 139 140 139  K 3.9 3.9 4.2 4.2 4.6  CL 102 104 104 103 97*  CO2 25 26 24 27 29   GLUCOSE 174* 167* 132* 118* 132*  BUN 9 10 8 9 11   CREATININE 1.01 1.08 1.07 1.05 1.13  CALCIUM 8.2* 8.4* 8.5* 8.6* 8.6*  MG  --   --   --  1.5* 1.6*   GFR: Estimated Creatinine Clearance: 49.6 mL/min (by C-G formula based on SCr of 1.13 mg/dL). Liver Function Tests: No results for input(s): AST, ALT, ALKPHOS,  BILITOT, PROT, ALBUMIN in the last 168 hours. No results for input(s): LIPASE, AMYLASE in the last 168 hours. No results for input(s): AMMONIA in the last 168 hours. Coagulation Profile: No results for input(s): INR, PROTIME in the last 168 hours. Cardiac Enzymes: No results for input(s): CKTOTAL, CKMB, CKMBINDEX, TROPONINI in the last 168 hours. BNP (last 3 results) No results for input(s): PROBNP in the last 8760 hours. HbA1C: No results for input(s): HGBA1C in the last 72 hours. CBG: Recent Labs  Lab 01/08/18 0629 01/08/18 1113 01/08/18 1624 01/08/18 2044 01/09/18 0555  GLUCAP 255* 98 219* 121* 175*   Lipid Profile: No results for input(s): CHOL, HDL, LDLCALC, TRIG, CHOLHDL, LDLDIRECT in the last 72 hours. Thyroid Function Tests: No results for input(s): TSH, T4TOTAL, FREET4, T3FREE, THYROIDAB in the last 72 hours. Anemia Panel: No results for input(s):  VITAMINB12, FOLATE, FERRITIN, TIBC, IRON, RETICCTPCT in the last 72 hours. Urine analysis:    Component Value Date/Time   COLORURINE YELLOW 12/28/2017 0016   APPEARANCEUR CLOUDY (A) 12/28/2017 0016   LABSPEC 1.026 12/28/2017 0016   PHURINE 5.0 12/28/2017 0016   GLUCOSEU NEGATIVE 12/28/2017 0016   HGBUR NEGATIVE 12/28/2017 0016   BILIRUBINUR NEGATIVE 12/28/2017 0016   KETONESUR NEGATIVE 12/28/2017 0016   PROTEINUR NEGATIVE 12/28/2017 0016   NITRITE NEGATIVE 12/28/2017 0016   LEUKOCYTESUR NEGATIVE 12/28/2017 0016   Sepsis Labs: @LABRCNTIP (procalcitonin:4,lacticidven:4)  ) No results found for this or any previous visit (from the past 240 hour(s)).    Studies: No results found.  Scheduled Meds: . amiodarone  200 mg Oral Daily  . apixaban  5 mg Oral BID  . bisoprolol  2.5 mg Oral Daily  . DULoxetine  30 mg Oral Daily  . ferrous sulfate  325 mg Oral BID WC  . furosemide  40 mg Oral BID  . insulin aspart  0-5 Units Subcutaneous QHS  . insulin aspart  0-9 Units Subcutaneous TID WC  . insulin glargine  10 Units Subcutaneous QHS  . mouth rinse  15 mL Mouth Rinse BID  . mupirocin ointment   Nasal BID  . pantoprazole  40 mg Oral Daily  . polyethylene glycol  17 g Oral Daily  . pravastatin  40 mg Oral q1800  . senna-docusate  1 tablet Oral BID    Continuous Infusions: . sodium chloride Stopped (01/03/18 1138)  .  ceFAZolin (ANCEF) IV Stopped (01/09/18 0403)  . magnesium sulfate 1 - 4 g bolus IVPB       LOS: 14 days     Elmarie Shiley, MD [facility] Pager [phone]  If 7PM-7AM, please contact night-coverage [email or web address] Password [facility] 01/09/2018, 10:53 AM

## 2018-01-09 NOTE — Progress Notes (Addendum)
Wound care given to abdomen.  Wet to dry dressing applied, packed wound with one 4 x 4 saline gauze.  Covered with dry 4 x 4 and surgical tape. Wound approximately one inch deep, three inches long and one inch wide.  Red edges noted.  Small amount of yellow drainage noted on old dressing. Patient tolerated well.

## 2018-01-09 NOTE — Plan of Care (Signed)
Reviewed and progressing

## 2018-01-10 DIAGNOSIS — D649 Anemia, unspecified: Secondary | ICD-10-CM | POA: Diagnosis not present

## 2018-01-10 DIAGNOSIS — I130 Hypertensive heart and chronic kidney disease with heart failure and stage 1 through stage 4 chronic kidney disease, or unspecified chronic kidney disease: Secondary | ICD-10-CM | POA: Diagnosis not present

## 2018-01-10 DIAGNOSIS — N179 Acute kidney failure, unspecified: Secondary | ICD-10-CM

## 2018-01-10 DIAGNOSIS — R296 Repeated falls: Secondary | ICD-10-CM | POA: Diagnosis not present

## 2018-01-10 DIAGNOSIS — R100 Acute abdomen: Secondary | ICD-10-CM | POA: Diagnosis not present

## 2018-01-10 DIAGNOSIS — A4901 Methicillin susceptible Staphylococcus aureus infection, unspecified site: Secondary | ICD-10-CM

## 2018-01-10 DIAGNOSIS — Z111 Encounter for screening for respiratory tuberculosis: Secondary | ICD-10-CM | POA: Diagnosis not present

## 2018-01-10 DIAGNOSIS — I255 Ischemic cardiomyopathy: Secondary | ICD-10-CM | POA: Diagnosis not present

## 2018-01-10 DIAGNOSIS — E1151 Type 2 diabetes mellitus with diabetic peripheral angiopathy without gangrene: Secondary | ICD-10-CM | POA: Diagnosis not present

## 2018-01-10 DIAGNOSIS — I2510 Atherosclerotic heart disease of native coronary artery without angina pectoris: Secondary | ICD-10-CM | POA: Diagnosis not present

## 2018-01-10 DIAGNOSIS — J439 Emphysema, unspecified: Secondary | ICD-10-CM | POA: Diagnosis not present

## 2018-01-10 DIAGNOSIS — K219 Gastro-esophageal reflux disease without esophagitis: Secondary | ICD-10-CM

## 2018-01-10 DIAGNOSIS — E119 Type 2 diabetes mellitus without complications: Secondary | ICD-10-CM | POA: Diagnosis not present

## 2018-01-10 DIAGNOSIS — D631 Anemia in chronic kidney disease: Secondary | ICD-10-CM | POA: Diagnosis not present

## 2018-01-10 DIAGNOSIS — N183 Chronic kidney disease, stage 3 (moderate): Secondary | ICD-10-CM | POA: Diagnosis not present

## 2018-01-10 DIAGNOSIS — D696 Thrombocytopenia, unspecified: Secondary | ICD-10-CM | POA: Diagnosis not present

## 2018-01-10 DIAGNOSIS — I739 Peripheral vascular disease, unspecified: Secondary | ICD-10-CM | POA: Diagnosis not present

## 2018-01-10 DIAGNOSIS — E785 Hyperlipidemia, unspecified: Secondary | ICD-10-CM | POA: Diagnosis not present

## 2018-01-10 DIAGNOSIS — R5381 Other malaise: Secondary | ICD-10-CM | POA: Diagnosis not present

## 2018-01-10 DIAGNOSIS — L02211 Cutaneous abscess of abdominal wall: Secondary | ICD-10-CM | POA: Diagnosis not present

## 2018-01-10 DIAGNOSIS — J449 Chronic obstructive pulmonary disease, unspecified: Secondary | ICD-10-CM | POA: Diagnosis not present

## 2018-01-10 DIAGNOSIS — I482 Chronic atrial fibrillation: Secondary | ICD-10-CM | POA: Diagnosis not present

## 2018-01-10 DIAGNOSIS — I10 Essential (primary) hypertension: Secondary | ICD-10-CM

## 2018-01-10 DIAGNOSIS — S31109A Unspecified open wound of abdominal wall, unspecified quadrant without penetration into peritoneal cavity, initial encounter: Secondary | ICD-10-CM | POA: Diagnosis not present

## 2018-01-10 DIAGNOSIS — T827XXD Infection and inflammatory reaction due to other cardiac and vascular devices, implants and grafts, subsequent encounter: Secondary | ICD-10-CM | POA: Diagnosis not present

## 2018-01-10 DIAGNOSIS — G4733 Obstructive sleep apnea (adult) (pediatric): Secondary | ICD-10-CM | POA: Diagnosis not present

## 2018-01-10 DIAGNOSIS — L0291 Cutaneous abscess, unspecified: Secondary | ICD-10-CM | POA: Diagnosis not present

## 2018-01-10 DIAGNOSIS — I5043 Acute on chronic combined systolic (congestive) and diastolic (congestive) heart failure: Secondary | ICD-10-CM | POA: Diagnosis not present

## 2018-01-10 DIAGNOSIS — I7389 Other specified peripheral vascular diseases: Secondary | ICD-10-CM | POA: Diagnosis not present

## 2018-01-10 DIAGNOSIS — I481 Persistent atrial fibrillation: Secondary | ICD-10-CM | POA: Diagnosis not present

## 2018-01-10 DIAGNOSIS — B9561 Methicillin susceptible Staphylococcus aureus infection as the cause of diseases classified elsewhere: Secondary | ICD-10-CM | POA: Diagnosis not present

## 2018-01-10 DIAGNOSIS — I4891 Unspecified atrial fibrillation: Secondary | ICD-10-CM | POA: Diagnosis not present

## 2018-01-10 DIAGNOSIS — M6281 Muscle weakness (generalized): Secondary | ICD-10-CM | POA: Diagnosis not present

## 2018-01-10 DIAGNOSIS — Z95810 Presence of automatic (implantable) cardiac defibrillator: Secondary | ICD-10-CM | POA: Diagnosis not present

## 2018-01-10 DIAGNOSIS — F339 Major depressive disorder, recurrent, unspecified: Secondary | ICD-10-CM | POA: Diagnosis not present

## 2018-01-10 LAB — GLUCOSE, CAPILLARY
GLUCOSE-CAPILLARY: 158 mg/dL — AB (ref 65–99)
GLUCOSE-CAPILLARY: 117 mg/dL — AB (ref 65–99)
Glucose-Capillary: 179 mg/dL — ABNORMAL HIGH (ref 65–99)

## 2018-01-10 LAB — CBC
HCT: 26.7 % — ABNORMAL LOW (ref 39.0–52.0)
Hemoglobin: 8.3 g/dL — ABNORMAL LOW (ref 13.0–17.0)
MCH: 26.4 pg (ref 26.0–34.0)
MCHC: 31.1 g/dL (ref 30.0–36.0)
MCV: 85 fL (ref 78.0–100.0)
Platelets: 212 10*3/uL (ref 150–400)
RBC: 3.14 MIL/uL — ABNORMAL LOW (ref 4.22–5.81)
RDW: 17.7 % — ABNORMAL HIGH (ref 11.5–15.5)
WBC: 8.4 10*3/uL (ref 4.0–10.5)

## 2018-01-10 LAB — BASIC METABOLIC PANEL
ANION GAP: 10 (ref 5–15)
BUN: 12 mg/dL (ref 6–20)
CALCIUM: 8.4 mg/dL — AB (ref 8.9–10.3)
CO2: 31 mmol/L (ref 22–32)
CREATININE: 1.29 mg/dL — AB (ref 0.61–1.24)
Chloride: 97 mmol/L — ABNORMAL LOW (ref 101–111)
GFR, EST AFRICAN AMERICAN: 59 mL/min — AB (ref >60)
GFR, EST NON AFRICAN AMERICAN: 51 mL/min — AB (ref >60)
Glucose, Bld: 187 mg/dL — ABNORMAL HIGH (ref 65–99)
Potassium: 4.1 mmol/L (ref 3.5–5.1)
Sodium: 138 mmol/L (ref 135–145)

## 2018-01-10 LAB — MAGNESIUM: Magnesium: 1.9 mg/dL (ref 1.7–2.4)

## 2018-01-10 MED ORDER — SENNOSIDES-DOCUSATE SODIUM 8.6-50 MG PO TABS: 1.0000 | ORAL_TABLET | Freq: Two times a day (BID) | ORAL | Status: AC

## 2018-01-10 MED ORDER — POLYETHYLENE GLYCOL 3350 17 G PO PACK: 17.0000 g | PACK | Freq: Every day | ORAL | 0 refills | Status: AC

## 2018-01-10 MED ORDER — HYDROCODONE-ACETAMINOPHEN 5-325 MG PO TABS: 1.0000 | ORAL_TABLET | Freq: Four times a day (QID) | ORAL | 0 refills | Status: DC | PRN

## 2018-01-10 MED ORDER — CEPHALEXIN 500 MG PO CAPS: 500.0000 mg | ORAL_CAPSULE | Freq: Four times a day (QID) | ORAL | 0 refills | Status: AC

## 2018-01-10 MED ORDER — HYDROCODONE-ACETAMINOPHEN 5-325 MG PO TABS: 1.0000 | ORAL_TABLET | Freq: Four times a day (QID) | ORAL | 0 refills | Status: AC | PRN

## 2018-01-10 MED ORDER — FERROUS SULFATE 325 (65 FE) MG PO TABS: 325.0000 mg | ORAL_TABLET | Freq: Two times a day (BID) | ORAL | 0 refills | Status: AC

## 2018-01-10 NOTE — Discharge Summary (Signed)
Discharge Summary  Victor [name] ZHG:[id number] DOB: 1936/11/14  PCP: Cyndi Bender, PA-C  Admit date: 12/26/2017 Discharge date: 01/10/2018  Time spent: > 30 mins  Recommendations for Outpatient Follow-up:  1. PCP 2. Cardiothoracic surgery 3. Cardiology  Discharge Diagnoses:  Active Hospital Problems   Diagnosis Date Noted  . Abdominal wall abscess 12/26/2017  . Normocytic anemia 01/01/2018  . Infected defibrillator ([place])   . MSSA (methicillin susceptible Staphylococcus aureus) infection   . Atrial fibrillation, persistent ([place]) 12/15/2017  . COPD (chronic obstructive pulmonary disease) ([place]) 12/15/2017  . AKI (acute kidney injury) ([place]) 07/16/2017  . Stage 3 chronic kidney disease ([place]) 10/27/2016  . OSA (obstructive sleep apnea)   . GERD (gastroesophageal reflux disease)   . Hx of CABG 1990   . Essential hypertension 10/02/2014  . Insulin dependent type 2 diabetes mellitus, controlled ([place]) 07/25/2009    Resolved Hospital Problems  No resolved problems to display.    Discharge Condition: Stable  Diet recommendation: Heart healthy    Vitals:   01/10/18 0400 01/10/18 1428  BP: 118/61 (!) 139/57  Pulse:  60  Resp: (!) 22 17  Temp: 97.6 F (36.4 C) (!) 97.5 F (36.4 C)  SpO2: 94% 99%    History of present illness:  Victor [name] is a 82 y.o. year old male with medical history significant for atrial fibrillation jon eliquis, CAD s/p CABG ( 1990), chronic combined systolic/diastolic CHF s/p ICD, history of VT ( 2012), T2DM, CKD Stage3, HTN, HLD who presented on 12/26/2017 and has been managed for left rectus sheath abdominal wall abscess. Since admission he was empirically treated with broad spectrum IV AB. Underwent I&D on 2/8 with wound VAC placement by CT surgery. Intraoperative sensitivities showed MSSA. TEE was done, negative for vegetation. Plan is for total 2 week of antibiotics after I&D.  Today, pt reported feeling better, had a very good night sleep. Denies  any chest pain, worsening SOB, fever/chills, abdominal pain, N/V/D/C.  Hospital Course:  Principal Problem:   Abdominal wall abscess Active Problems:   Insulin dependent type 2 diabetes mellitus, controlled (HCC)   Essential hypertension   GERD (gastroesophageal reflux disease)   Hx of CABG 1990   OSA (obstructive sleep apnea)   Stage 3 chronic kidney disease (HCC)   AKI (acute kidney injury) (HCC)   Atrial fibrillation, persistent (HCC)   COPD (chronic obstructive pulmonary disease) (HCC)   Normocytic anemia   Infected defibrillator (HCC)   MSSA (methicillin susceptible Staphylococcus aureus) infection  MSSA Left rectus sheath abdominal wall abscess Afebrile, no leukocytosis Likely due to old epicardial leads  Status post I&D on 2/8 and wound VAC placement. Currently wound vac discontinued Blood cultures have remained negative TEE negative for pacer wires with no thrombus or vegetation ID recommendations, s/p cefazolin, discharge on Keflex 500 mg QID for 3 more days to complete 2 weeks of antibiotics  Cardiothoracic surgery on board: Daily wet to dry dressing changes at the SNF. Follow-up in our office next week  AKI on CKD Stage 3, resolved Creatinine back at baseline.  Peak of 2.3 ( up from baseline of 1.4-1.7)  Suspect related to vancomycin toxicity ([place] on 2/8:17)  Atrial Fibrillation, rate controlled New onset from previous hospitalization on 11/2017 Cardiology plan was for DCCV in 3-4 weeks as an outpatient.  Continue bisoprolol, Amiodarone, eliquis  Acute on Chronic Anemia, worsened due to blood loss from abdominal hematoma Stable, 8.3 Remote iron panel shows iron deficiency anemia Baseline hgb 9-10 Continue with  oral iron   Acute Combined systolic/diastolic CHF (1/47/82 TTE: EF 30-35%), stable Improved Continue PO lasix 40 mg BID Monitor renal function  COPD on home oxygen 3L, stable No wheezing or respiratory distress.   Currently back on home oxygen  regimen   T2DM, controlled, A1c 8.6(12/2017) Continue home meds  HLD, stable Continue home statin  GERD, stable Continue protonix     Procedures:  I&D  Consultations:  CTS  Discharge Exam: BP (!) 139/57 (BP Location: Right Arm)   Pulse 60   Temp (!) 97.5 F (36.4 C) (Oral)   Resp 17   Ht 5\' 7"  (1.702 m)   Wt 73.2 kg (161 lb 6.4 oz)   SpO2 99%   BMI 25.28 kg/m   General: Alert, awake, oriented Cardiovascular: S1, S2 present Respiratory: Chest clear bilaterally  Discharge Instructions You were cared for by a hospitalist during your hospital stay. If you have any questions about your discharge medications or the care you received while you were in the hospital after you are discharged, you can call the unit and asked to speak with the hospitalist on call if the hospitalist that took care of you is not available. Once you are discharged, your primary care physician will handle any further medical issues. Please note that NO REFILLS for any discharge medications will be authorized once you are discharged, as it is imperative that you return to your primary care physician (or establish a relationship with a primary care physician if you do not have one) for your aftercare needs so that they can reassess your need for medications and monitor your lab values.   Allergies as of 01/10/2018      Reactions   Coreg [carvedilol] Other (See Comments)   Shortness of breath ,fatigue ,dizzyness    Lovastatin Other (See Comments)   CK elevation, Myalgias   Ativan [lorazepam] Other (See Comments)   Pt. had side effects      Medication List    STOP taking these medications   insulin glargine 100 UNIT/ML injection Commonly known as:  LANTUS     TAKE these medications   acetaminophen 325 MG tablet Commonly known as:  TYLENOL Take 2 tablets (650 mg total) by mouth every 4 (four) hours as needed for headache or mild pain.   amiodarone 200 MG tablet Commonly known as:   PACERONE Take 1 tablet (200 mg total) by mouth daily.   apixaban 5 MG Tabs tablet Commonly known as:  ELIQUIS Take 1 tablet (5 mg total) by mouth 2 (two) times daily.   bisoprolol 5 MG tablet Commonly known as:  ZEBETA Take 0.5 tablets (2.5 mg total) by mouth daily.   CALCIUM PO Take 1 tablet by mouth at bedtime.   cephALEXin 500 MG capsule Commonly known as:  KEFLEX Take 1 capsule (500 mg total) by mouth 4 (four) times daily for 3 days.   docusate sodium 100 MG capsule Commonly known as:  COLACE Take 1 capsule (100 mg total) by mouth at bedtime.   DULoxetine 30 MG capsule Commonly known as:  CYMBALTA Take 30 mg by mouth daily.   ferrous sulfate 325 (65 FE) MG tablet Take 1 tablet (325 mg total) by mouth 2 (two) times daily with a meal.   furosemide 40 MG tablet Commonly known as:  LASIX Take 1 tablet (40 mg total) by mouth 2 (two) times daily.   HYDROcodone-acetaminophen 5-325 MG tablet Commonly known as:  NORCO/VICODIN Take 1 tablet by mouth every 6 (six)  hours as needed for moderate pain or severe pain.   insulin detemir 100 UNIT/ML injection Commonly known as:  LEVEMIR Inject 10 Units into the skin See admin instructions. 10 units in the morning. May increase as needed to keep fasting BS < 130.   lidocaine 5 % Commonly known as:  LIDODERM Place 2 patches onto the skin daily. Remove & Discard patch within 12 hours or as directed by MD   linagliptin 5 MG Tabs tablet Commonly known as:  TRADJENTA Take 1 tablet (5 mg total) by mouth daily.   methocarbamol 750 MG tablet Commonly known as:  ROBAXIN Take 1 tablet (750 mg total) by mouth every 8 (eight) hours as needed for muscle spasms.   nitroGLYCERIN 0.4 MG SL tablet Commonly known as:  NITROSTAT Place 0.4 mg under the tongue every 5 (five) minutes as needed for chest pain.   omeprazole 20 MG capsule Commonly known as:  PRILOSEC Take 20 mg by mouth every morning.   polyethylene glycol packet Commonly known  as:  MIRALAX / GLYCOLAX Take 17 g by mouth daily. Start taking on:  01/11/2018   pravastatin 40 MG tablet Commonly known as:  PRAVACHOL Take 1 tablet (40 mg total) by mouth daily at 6 PM.   senna-docusate 8.6-50 MG tablet Commonly known as:  Senokot-S Take 1 tablet by mouth 2 (two) times daily.   traMADol 50 MG tablet Commonly known as:  ULTRAM Take 50 mg by mouth 3 (three) times daily as needed for pain.   VITAMIN B-12 PO Take 1 tablet by mouth daily.      Allergies  Allergen Reactions  . Coreg [Carvedilol] Other (See Comments)    Shortness of breath ,fatigue ,dizzyness   . Lovastatin Other (See Comments)    CK elevation, Myalgias  . Ativan [Lorazepam] Other (See Comments)    Pt. had side effects    Contact information for follow-up providers    [facility] Follow up on 01/17/2018.   Specialty:  Cardiothoracic Surgery Why:  Appointment is at 11:45 for wound check Contact information: [address] [place], [place] [phone]       Cyndi Bender, [place]. Schedule an appointment as soon as possible for a visit in 1 week(s).   Specialty:  Physician Assistant Contact information: [place] [address] [phone]        [place], Peter M, MD Follow up.   Specialty:  Cardiology Contact information: [address] [place] [address] [phone]            Contact information for after-discharge care    [facility] Follow up.   Service:  Skilled Nursing Contact information: [place] [phone]                   The results of significant diagnostics from this hospitalization (including imaging, microbiology, ancillary and laboratory) are listed below for reference.    Significant Diagnostic Studies: Dg Chest 2 View  Result Date: 12/28/2017 CLINICAL DATA:   Shortness of breath.  Ischemic cardiomyopathy. EXAM: CHEST  2 VIEW COMPARISON:  12/15/2017. FINDINGS: AICD unchanged. Enlarged cardiomediastinal silhouette. Previous median sternotomy for CABG. Mild vascular congestion without frank edema or consolidation. IMPRESSION: Stable chest. Electronically Signed   By: Staci Righter M.D.   On: 12/28/2017 09:36   Ct Abdomen Pelvis W Contrast  Result Date: 12/26/2017 CLINICAL DATA:  Abdominal pain, fever. EXAM: CT ABDOMEN AND PELVIS WITH CONTRAST TECHNIQUE: Multidetector CT imaging of the abdomen and pelvis was performed using the standard protocol following bolus administration of intravenous contrast. CONTRAST:  42mL ISOVUE-300 IOPAMIDOL (ISOVUE-300) INJECTION 61% COMPARISON:  CT scan of January 12, 2011. Ultrasound of December 05, 2017. FINDINGS: Lower chest: No acute abnormality. Hepatobiliary: Cholelithiasis is noted without inflammation. Liver is unremarkable. Pancreas: Unremarkable. No pancreatic ductal dilatation or surrounding inflammatory changes. Spleen: Normal in size without focal abnormality. Adrenals/Urinary Tract: Adrenal glands appear normal. Small right renal cyst is noted. No hydronephrosis or renal obstruction is noted. No renal or ureteral calculi are noted. Urinary bladder is unremarkable. Stomach/Bowel: Stomach is within normal limits. Appendix appears normal. No evidence of bowel wall thickening, distention, or inflammatory changes. Vascular/Lymphatic: Aortic atherosclerosis. No enlarged abdominal or pelvic lymph nodes. Reproductive: Prostate is unremarkable. Other: There is continued presence of fluid collection seen around epicardial leads in the left lower quadrant of the abdomen this fluid collection currently measures 8.8 x 3.0 cm. Inflammatory changes are now noted around it and this is concerning for abscess. Please refer to laboratory data from ultrasound-guided aspiration performed on January 15. Musculoskeletal: No acute or significant  osseous findings. IMPRESSION: 8.8 x 3.0 cm fluid collection is seen in the subcutaneous tissues of the left lower quadrant the abdomen surrounding epicardial leads. Inflammatory changes noted around this concerning for abscess. This fluid collection was recently aspirated, and correlation with laboratory data is recommended. Cholelithiasis is noted without inflammation. Aortic atherosclerosis. Electronically Signed   By: Marijo Conception, M.D.   On: 12/26/2017 15:04   US Renal  Result Date: 12/28/2017 CLINICAL DATA:  Acute kidney injury EXAM: RENAL / URINARY TRACT ULTRASOUND COMPLETE COMPARISON:  CT abdomen 12/26/2017 FINDINGS: Right Kidney: Length: 10.9 cm. Increased renal cortical echogenicity. No mass or hydronephrosis visualized. Left Kidney: Length: 8.4 cm. Increased renal cortical echogenicity. No mass or hydronephrosis visualized. Bladder: Appears normal for degree of bladder distention. Other: Prostatic measures 3.4 x 2.9 x 3.8 cm. Mild splenomegaly measuring 12.6 cm in length. IMPRESSION: 1. No obstructive uropathy. 2. Increased renal cortical echogenicity bilaterally as can be seen with medical renal disease. Electronically Signed   By: Kathreen Devoid   On: 12/28/2017 13:08   Dg Chest Port 1 View  Result Date: 12/15/2017 CLINICAL DATA:  Shortness of breath.  History of AFib. EXAM: PORTABLE CHEST 1 VIEW COMPARISON:  12/09/2017 FINDINGS: Stable postsurgical changes from CABG. Stable transvenous and direct epicardial pacer leads. Enlarged cardiac silhouette. Mediastinal contours appear intact. Calcific atherosclerotic disease of the aorta. There is no evidence of pneumothorax. No frank pulmonary edema. Low lung volumes with mild prominence of the interstitium. Persistent peribronchial airspace opacities in the left lower lobe. Possible small left pleural effusion. Osseous structures are without acute abnormality. Soft tissues are grossly normal. IMPRESSION: Low lung volumes with mild pulmonary vascular  congestion. Enlarged cardiac silhouette. Persistent left lower lobe peribronchial airspace consolidation versus atelectasis. Possible small left pleural effusion. Electronically Signed   By: Fidela Salisbury M.D.   On: 12/15/2017 12:01    Microbiology: No results found for this or any previous visit (from the past 240 hour(s)).   Labs: Basic Metabolic Panel: Recent Labs  Lab 01/04/18 0323 01/06/18 1246 01/07/18 0847 01/09/18 0230 01/10/18 0332  NA 140 139 140 139 138  K 3.9 4.2 4.2 4.6 4.1  CL 104 104 103 97* 97*  CO2 26 24 27 29 31   GLUCOSE 167* 132* 118* 132* 187*  BUN 10 8 9 11 12   CREATININE 1.08 1.07 1.05 1.13 1.29*  CALCIUM 8.4* 8.5* 8.6* 8.6* 8.4*  MG  --   --  1.5* 1.6* 1.9   Liver Function Tests: No results for input(s): AST, ALT, ALKPHOS, BILITOT, PROT, ALBUMIN in the last 168 hours. No results for input(s): LIPASE, AMYLASE in the last 168 hours. No results for input(s): AMMONIA in the last 168 hours. CBC: Recent Labs  Lab 01/06/18 0320 01/07/18 0627 01/08/18 0219 01/09/18 0230 01/10/18 0332  WBC 10.5 9.3 9.8 9.9 8.4  HGB 8.3* 8.0* 8.5* 8.9* 8.3*  HCT 27.8* 26.6* 28.1* 29.1* 26.7*  MCV 86.3 85.5 85.7 84.8 85.0  PLT 232 222 258 221 212   Cardiac Enzymes: No results for input(s): CKTOTAL, CKMB, CKMBINDEX, TROPONINI in the last 168 hours. BNP: BNP (last 3 results) Recent Labs    07/16/17 2041 09/05/17 1434 11/08/17 0928  BNP 1,436.0* 1,195.5* 796.6*    ProBNP (last 3 results) No results for input(s): PROBNP in the last 8760 hours.  CBG: Recent Labs  Lab 01/09/18 1121 01/09/18 1615 01/09/18 2146 01/10/18 0616 01/10/18 1103  GLUCAP 147* 156* 144* 158* 117*       Signed:  Alma Friendly, MD [facility] 01/10/2018, 3:12 PM

## 2018-01-10 NOTE — Progress Notes (Addendum)
      [facility]       [place] [address]             [phone]      7 Days Post-Op Procedure(s) (LRB): TRANSESOPHAGEAL ECHOCARDIOGRAM (TEE) (N/A) Subjective: Sleeping this morning. He feels good. He is having limited pain from his abdominal wound.   Objective: Vital signs in last 24 hours: Temp:  [97.6 F (36.4 C)-97.7 F (36.5 C)] 97.6 F (36.4 C) (02/20 0400) Pulse Rate:  [60] 60 (02/19 1356) Cardiac Rhythm: Heart block (02/20 0700) Resp:  [17-22] 22 (02/20 0400) BP: (118-136)/(58-72) 118/61 (02/20 0400) SpO2:  [94 %-100 %] 94 % (02/20 0400) Weight:  [161 lb 6.4 oz (73.2 kg)] 161 lb 6.4 oz (73.2 kg) (02/20 0400)     Intake/Output from previous day: 02/19 0701 - 02/20 0700 In: 1240 [P.O.:840; I.V.:200; IV Piggyback:200] Out: 1625 [Urine:1625] Intake/Output this shift: No intake/output data recorded.  General appearance: alert, cooperative and no distress Heart: sinus bradycardia Lungs: clear to auscultation bilaterally Abdomen: soft, non-tender; bowel sounds normal; no masses,  no organomegaly Extremities: extremities normal, atraumatic, no cyanosis or edema Wound: wound is packed with wet to dry dressing  Lab Results: Recent Labs    01/09/18 0230 01/10/18 0332  WBC 9.9 8.4  HGB 8.9* 8.3*  HCT 29.1* 26.7*  PLT 221 212   BMET:  Recent Labs    01/09/18 0230 01/10/18 0332  NA 139 138  K 4.6 4.1  CL 97* 97*  CO2 29 31  GLUCOSE 132* 187*  BUN 11 12  CREATININE 1.13 1.29*  CALCIUM 8.6* 8.4*    PT/INR: No results for input(s): LABPROT, INR in the last 72 hours. ABG    Component Value Date/Time   PHART 7.475 (H) 12/06/2017 1730   HCO3 27.4 12/06/2017 1730   TCO2 32 03/20/2015 1638   O2SAT 98.1 12/06/2017 1730   CBG (last 3)  Recent Labs    01/09/18 1615 01/09/18 2146 01/10/18 0616  GLUCAP 156* 144* 158*    Assessment/Plan: S/P Procedure(s) (LRB): TRANSESOPHAGEAL ECHOCARDIOGRAM (TEE) (N/A)  1. Abdominal wound-  packed with wet to dry dressings that will need to be changed daily 2. CV- CHF, lower extremity edema improving with diuretics, continue per medicine 3. Dispo- continue current care, orders placed for daily wet to dry dressing changes 4. Pain from the wound is well controlled. He is having neck and back pain. Robaxin PRN.   Okay to discharge the patient from a cardiothoracic surgery perspective. Daily wet to dry dressing changes at the SNF. Follow-up in our office next week.      LOS: 15 days    Elgie Collard 01/10/2018 patient examined and medical record reviewed,agree with above note. Tharon Aquas Trigt III 01/10/2018

## 2018-01-10 NOTE — Care Management Note (Signed)
Case Management Note Original Note Created Zenon Mayo, RN 12/08/2017, 3:08 PM   Patient Details  Name: Victor [name] MRN: [id number] Date of Birth: [birth year]  Subjective/Objective:    From home with grandson, patient has two or three canes at home and three walkers per patien. Daughter , Tammy at bedside.  He has PCP and medication coverage.  He is here  with  Afib, hematoma  evacuation, cad, chf, will be on eliquis,  NCM gave patient the 30 day free card for eliquis.  Awaiting benefit check.  NCM will call daughter Lynelle Smoke to give her this information when received at [phone].                Action/Plan: NCM will follow for dc needs.   Additional CM follow up notes:  Sherrilyn Rist [phone] 12/15/2017, 11:01 AM---Patient lives at home with his grandson; PCP is in [place], he cant remember his name at this time; has private insurance with Medicare with prescription drug coverage; pharmacy of choice is [facility]; DME - home oxygen, cane,walker and scooter at home; his grandson's wife takes him to his apts; [place] choice offered, pt chose [facility]; [place] with [facility] called for arrangements.   Expected Discharge Date:  01/10/18               Expected Discharge Plan:  [place]  In-House Referral:  [facility], Clinical Social Work  Discharge planning Services  CM Consult  Post Acute Care Choice:  Resumption of Svcs/PTA Provider, Home Health Choice offered to:  Patient  DME Arranged:    DME Agency:     HH Arranged:  RN, PT, OT, Nurse's Aide [place] Agency:  [facility] [employer]  Status of Service:  Completed, signed off  If discussed at [employer], dates discussed:    Discharge Disposition: skilled facility   Additional Comments:  01/10/18- 1500- Marvetta Gibbons RN, CM- Pt from home alone- grandson assist pt but is unable to provide needed assistance at this time- pt for d/c today to SNF- wound  VAC removed 2/18 - drsg w-d now- pt will go out on po abx per ID. CSW following for STSNF placement- pt is agreeable at this time.   Marvetta Gibbons [place], RN 01/10/2018, 3:07 PM [phone] 4E Transition Care Coordinator

## 2018-01-10 NOTE — Progress Notes (Signed)
Attempted to call report to [facility].  No answer.

## 2018-01-10 NOTE — Progress Notes (Signed)
Clinical Social Worker facilitated patient discharge including contacting patient family and facility to confirm patient discharge plans.  Clinical information faxed to facility and family agreeable with plan.  CSW arranged ambulance transport via [facility] to [facility]  .  RN to call [phone] (pt will go in room 2)  for report prior to discharge.  Clinical Social Worker will sign off for now as social work intervention is no longer needed. Please consult [place] again if new need arises.  Rhea Pink, MSW, [place]

## 2018-01-10 NOTE — Progress Notes (Signed)
Patient discharged to [facility], transported by [facility], report called to receiving RN. Telemetry monitor removed and CCMD notified.  PIV access removed.

## 2018-01-10 NOTE — Progress Notes (Signed)
CSW following patient for discharge needs. Patient has bed available at [facility] once medically ready.  Rhea Pink, MSW,  [place]

## 2018-01-10 NOTE — Clinical Social Work Placement (Signed)
   CLINICAL SOCIAL WORK PLACEMENT  NOTE  Date:  01/10/2018  Patient Details  Name: Victor [name] MRN: [id number] Date of Birth: October 13, 1936  Clinical Social Work is seeking post-discharge placement for this patient at the [place] level of care (*CSW will initial, date and re-position this form in  chart as items are completed):  Yes   Patient/family provided with [place] Work Department's list of facilities offering this level of care within the geographic area requested by the patient (or if unable, by the patient's family).  Yes   Patient/family informed of their freedom to choose among providers that offer the needed level of care, that participate in Medicare, Medicaid or managed care program needed by the patient, have an available bed and are willing to accept the patient.  Yes   Patient/family informed of [facility]'s ownership interest in [facility] and [facility], as well as of the fact that they are under no obligation to receive care at these facilities.  PASRR submitted to EDS on       PASRR number received on       Existing PASRR number confirmed on 01/10/18     FL2 transmitted to all facilities in geographic area requested by pt/family on       FL2 transmitted to all facilities within larger geographic area on 01/10/18     Patient informed that his/her managed care company has contracts with or will negotiate with certain facilities, including the following:            Patient/family informed of bed offers received.  Patient chooses bed at [facility]     Physician recommends and patient chooses bed at      Patient to be transferred to [facility] on 01/10/18.  Patient to be transferred to facility by [facility]     Patient family notified on 01/10/18 of transfer.  Name of family member notified:  CSW spoke to pateints grandson via phone      PHYSICIAN       Additional Comment:     _______________________________________________ Wende Neighbors, LCSW 01/10/2018, 4:08 PM

## 2018-01-11 ENCOUNTER — Encounter: Payer: Medicare Other | Admitting: *Deleted

## 2018-01-11 ENCOUNTER — Telehealth: Payer: Self-pay | Admitting: Cardiology

## 2018-01-11 NOTE — Telephone Encounter (Signed)
LMOVM reminding pt to send remote transmission.   

## 2018-01-12 ENCOUNTER — Encounter: Payer: Self-pay | Admitting: Cardiology

## 2018-01-12 DIAGNOSIS — R5381 Other malaise: Secondary | ICD-10-CM | POA: Diagnosis not present

## 2018-01-12 DIAGNOSIS — S31109A Unspecified open wound of abdominal wall, unspecified quadrant without penetration into peritoneal cavity, initial encounter: Secondary | ICD-10-CM | POA: Diagnosis not present

## 2018-01-12 DIAGNOSIS — N183 Chronic kidney disease, stage 3 (moderate): Secondary | ICD-10-CM | POA: Diagnosis not present

## 2018-01-12 DIAGNOSIS — I130 Hypertensive heart and chronic kidney disease with heart failure and stage 1 through stage 4 chronic kidney disease, or unspecified chronic kidney disease: Secondary | ICD-10-CM | POA: Diagnosis not present

## 2018-01-17 ENCOUNTER — Encounter: Payer: Self-pay | Admitting: Cardiothoracic Surgery

## 2018-01-17 ENCOUNTER — Ambulatory Visit (INDEPENDENT_AMBULATORY_CARE_PROVIDER_SITE_OTHER): Payer: Self-pay | Admitting: Cardiothoracic Surgery

## 2018-01-17 ENCOUNTER — Ambulatory Visit: Payer: Medicare Other

## 2018-01-17 VITALS — BP 117/72 | HR 64 | Resp 20 | Ht 67.0 in | Wt 154.0 lb

## 2018-01-17 DIAGNOSIS — Z09 Encounter for follow-up examination after completed treatment for conditions other than malignant neoplasm: Secondary | ICD-10-CM

## 2018-01-17 NOTE — Progress Notes (Signed)
PCP is Cyndi Bender, PA-C Referring Provider is Minus Breeding, MD  Chief Complaint  Patient presents with  . Routine Post Op    Incision and drainage with irrigation, wound vac placement    HPI: Patient presents the office for follow-up and wound check. Patient is a chronically ill 82 year old gentleman with long-standing heart failure and atrial fibrillation status post previous CABG and AICD implantation.  Last month he underwent drainage of a left upper abdominal wall hematoma which resulted from external trauma and Eliquis.  The hematoma was located in the prior AICD generator pocket.  The hematoma was drained because of intense pain.  The encapsulated pocket subsequently became infected and required incision and drainage.  He grew out MSSA.  He is now at a skilled nursing facility at [facility] receiving daily dressing changes and oral Keflex.  On exam today the wound is contracting.  The sides of the wound have clean granulation tissue.  The depth of the wound which is only 1 cm deep is with some fibrinous exudate.  I personally examined and cleaned and packed the wound today in the office.  We will continue the antibiotics and daily dressing changes and follow-up wound care in office.  Past Medical History:  Diagnosis Date  . Anemia   . Arthritis   . Atrial fibrillation, persistent ([place]) 12/15/2017  . Cardiomyopathy, ischemic    a. s/p ICD placement  . Chronic combined systolic and diastolic CHF (congestive heart failure) ([place])   . CKD (chronic kidney disease), stage III ([place])   . COPD (chronic obstructive pulmonary disease) ([place])   . Coronary artery disease    a. s/p CABG 1990. b. multiple PCIs since that time including CTO angioplasty (without stenting) PCI of RCA 09/2014, with last cath 02/2015 done for worsening CHF with unsuccessful attempt at PCI of the RCA due to inability to cross occlusion - medical therapy advised at that time.  . Debilitated 12/15/2017  . GERD  (gastroesophageal reflux disease)   . HLD (hyperlipidemia)   . Hypertension   . Occlusion and stenosis of carotid artery without mention of cerebral infarction   . On home oxygen therapy    "2L; 24/7" (11/08/2017)  . Orthostatic hypotension   . Paroxysmal ventricular tachycardia ([place])   . Peripheral vascular disease ([place])   . Raynaud's syndrome   . Stroke [facility])    a. CT head 06/2015: Small old lacunar infarct LEFT basal ganglia.  . Thrombocytopenia ([place])   . TIA (transient ischemic attack)    a. s/p LHC on 10/01/14   . Type II diabetes mellitus ([place])     Past Surgical History:  Procedure Laterality Date  . ANAL FISTULECTOMY  2000's  . APPLICATION OF WOUND VAC Left 12/29/2017   Procedure: APPLICATION OF WOUND VAC;  Surgeon: Ivin Poot, MD;  Location: [place];  Service: Vascular;  Laterality: Left;  . CARDIAC CATHETERIZATION  10/01/2014   PTVA to CTO with restoration of TIMI 3  . CATARACT EXTRACTION, BILATERAL Bilateral ~ 2009  . CHEST EXPLORATION N/A 12/11/2017   Procedure: IRRIGATION AND DEBRIDEMENT OF CHEST HEMATOMA;  Surgeon: Ivin Poot, MD;  Location: [place];  Service: Thoracic;  Laterality: N/A;  . CORONARY ARTERY BYPASS GRAFT  01/1989   "CABG X3"   . FINGER FRACTURE SURGERY Left ~ 2000   "pointer"  . I&D EXTREMITY Left 12/29/2017   Procedure: IRRIGATION AND DEBRIDEMENT ABDOMINAL WALL ABSCESS;  Surgeon: Ivin Poot, MD;  Location: [place];  Service: Vascular;  Laterality: Left;  . IMPLANTABLE CARDIOVERTER DEFIBRILLATOR (ICD) GENERATOR CHANGE Left 10/05/2011   Procedure: ICD GENERATOR CHANGE;  Surgeon: Evans Lance, MD;  Location: [facility] CATH LAB;  Service: Cardiovascular;  Laterality: Left;  . LEFT AND RIGHT HEART CATHETERIZATION WITH CORONARY/GRAFT ANGIOGRAM N/A 04/26/2013   Procedure: LEFT AND RIGHT HEART CATHETERIZATION WITH Beatrix Fetters;  Surgeon: Sherren Mocha, MD;  Location: [facility] CATH LAB;  Service: Cardiovascular;  Laterality: N/A;  . LEFT HEART  CATHETERIZATION WITH CORONARY ANGIOGRAM N/A 08/01/2014   Procedure: LEFT HEART CATHETERIZATION WITH CORONARY ANGIOGRAM;  Surgeon: Sinclair Grooms, MD;  Location: [facility] CATH LAB;  Service: Cardiovascular;  Laterality: N/A;  . LEFT HEART CATHETERIZATION WITH CORONARY ANGIOGRAM N/A 03/20/2015   Procedure: LEFT HEART CATHETERIZATION WITH CORONARY ANGIOGRAM;  Surgeon: Kristle Wesch M Martinique, MD;  Location: [facility] CATH LAB;  Service: Cardiovascular;  Laterality: N/A;  . PERCUTANEOUS STENT INTERVENTION  04/26/2013   Procedure: PERCUTANEOUS STENT INTERVENTION;  Surgeon: Sherren Mocha, MD;  Location: [facility] CATH LAB;  Service: Cardiovascular;;  . RIGHT HEART CATHETERIZATION  10/01/2014   Procedure: RIGHT HEART CATH;  Surgeon: Lilia Letterman M Martinique, MD;  Location: [facility] CATH LAB;  Service: Cardiovascular;;  . TEE WITHOUT CARDIOVERSION N/A 01/03/2018   Procedure: TRANSESOPHAGEAL ECHOCARDIOGRAM (TEE);  Surgeon: Sueanne Margarita, MD;  Location: [facility] ENDOSCOPY;  Service: Cardiovascular;  Laterality: N/A;    Family History  Problem Relation Age of Onset  . Heart failure Mother 56  . Heart attack Father   . Leukemia Sister   . Stomach cancer Maternal Grandfather     Social History Social History   Tobacco Use  . Smoking status: Former Smoker    Years: 3.00    Types: Pipe  . Smokeless tobacco: Never Used  . Tobacco comment: "quit smoking pipe in early 1970's"  Substance Use Topics  . Alcohol use: Yes    Comment: 11/08/2017 "wine w/dinner maybe once/yr; or less"  . Drug use: No    Current Outpatient Medications  Medication Sig Dispense Refill  . acetaminophen (TYLENOL) 325 MG tablet Take 2 tablets (650 mg total) by mouth every 4 (four) hours as needed for headache or mild pain.    Marland Kitchen amiodarone (PACERONE) 200 MG tablet Take 1 tablet (200 mg total) by mouth daily. 30 tablet 6  . apixaban (ELIQUIS) 5 MG TABS tablet Take 1 tablet (5 mg total) by mouth 2 (two) times daily. 60 tablet 6  . bisoprolol (ZEBETA) 5 MG tablet Take 0.5  tablets (2.5 mg total) by mouth daily. 90 tablet 3  . CALCIUM PO Take 1 tablet by mouth at bedtime.    . Cyanocobalamin (VITAMIN B-12 PO) Take 1 tablet by mouth daily.     Marland Kitchen docusate sodium (COLACE) 100 MG capsule Take 1 capsule (100 mg total) by mouth at bedtime. 10 capsule 0  . DULoxetine (CYMBALTA) 30 MG capsule Take 30 mg by mouth daily.   3  . ferrous sulfate 325 (65 FE) MG tablet Take 1 tablet (325 mg total) by mouth 2 (two) times daily with a meal. 30 tablet 0  . furosemide (LASIX) 40 MG tablet Take 1 tablet (40 mg total) by mouth 2 (two) times daily. 60 tablet 6  . HYDROcodone-acetaminophen (NORCO/VICODIN) 5-325 MG tablet Take 1 tablet by mouth every 6 (six) hours as needed for moderate pain or severe pain. 20 tablet 0  . insulin detemir (LEVEMIR) 100 UNIT/ML injection Inject 10 Units into the skin See admin instructions. 10 units in the morning. May increase as  needed to keep fasting BS < 130.    . lidocaine (LIDODERM) 5 % Place 2 patches onto the skin daily. Remove & Discard patch within 12 hours or as directed by MD 30 patch 0  . linagliptin (TRADJENTA) 5 MG TABS tablet Take 1 tablet (5 mg total) by mouth daily. 30 tablet 1  . methocarbamol (ROBAXIN) 750 MG tablet Take 1 tablet (750 mg total) by mouth every 8 (eight) hours as needed for muscle spasms. 30 tablet 1  . nitroGLYCERIN (NITROSTAT) 0.4 MG SL tablet Place 0.4 mg under the tongue every 5 (five) minutes as needed for chest pain.    Marland Kitchen omeprazole (PRILOSEC) 20 MG capsule Take 20 mg by mouth every morning.  7  . polyethylene glycol (MIRALAX / GLYCOLAX) packet Take 17 g by mouth daily. 14 each 0  . pravastatin (PRAVACHOL) 40 MG tablet Take 1 tablet (40 mg total) by mouth daily at 6 PM. 30 tablet 0  . senna-docusate (SENOKOT-S) 8.6-50 MG tablet Take 1 tablet by mouth 2 (two) times daily.    . traMADol (ULTRAM) 50 MG tablet Take 50 mg by mouth 3 (three) times daily as needed for pain.  2   No current facility-administered medications  for this visit.     Allergies  Allergen Reactions  . Coreg [Carvedilol] Other (See Comments)    Shortness of breath ,fatigue ,dizzyness   . Lovastatin Other (See Comments)    CK elevation, Myalgias  . Ativan [Lorazepam] Other (See Comments)    Pt. had side effects    Review of Systems  Afebrile Patient ambulating independently at the nursing home He is on home oxygen  BP 117/72   Pulse 64   Resp 20   Ht 5\' 7"  (1.702 m)   Wt 154 lb (69.9 kg)   SpO2 94% Comment: 3L O2 per [place]  BMI 24.12 kg/m  Physical Exam Alert and appropriate Heart rate irregular Lungs clear Left upper quadrant wound 6 cm long by 1 cm deep-debrided and repacked with saline wet-to-dry after application of silver nitrate  Diagnostic Tests: None  Impression: Old AICD pocket wound infection slowly healing Continue current care and office follow-up  Plan: Patient return in 1 week for exam.  At that point he will probably be able to safely transition to his home and wound care and medical care by home health nursing.   Len Childs, MD [facility] [phone]

## 2018-01-24 ENCOUNTER — Ambulatory Visit (INDEPENDENT_AMBULATORY_CARE_PROVIDER_SITE_OTHER): Payer: Self-pay | Admitting: Cardiothoracic Surgery

## 2018-01-24 ENCOUNTER — Encounter: Payer: Self-pay | Admitting: Cardiothoracic Surgery

## 2018-01-24 VITALS — BP 140/62 | Resp 20 | Ht 67.0 in | Wt 154.0 lb

## 2018-01-24 DIAGNOSIS — Z5189 Encounter for other specified aftercare: Secondary | ICD-10-CM

## 2018-01-24 DIAGNOSIS — Z09 Encounter for follow-up examination after completed treatment for conditions other than malignant neoplasm: Secondary | ICD-10-CM

## 2018-01-24 NOTE — Progress Notes (Signed)
HPI Office visit for wound check of infected old AICD pocket left upper quadrant.  Culture grew out MSSA.  Patient has been treated with oral Keflex since discharge to skilled nursing facility.  The wound is being packed daily with wet-to-dry dressings and healing secondarily.  No fevers.  Patient feels strong ambulating well and is ready to be discharged from the skilled nursing facility.  We will order home health nursing for daily dressing changes.  He will continue 1 more week of oral Keflex when he returns home. Current Outpatient Medications  Medication Sig Dispense Refill  . acetaminophen (TYLENOL) 325 MG tablet Take 2 tablets (650 mg total) by mouth every 4 (four) hours as needed for headache or mild pain.    Marland Kitchen amiodarone (PACERONE) 200 MG tablet Take 1 tablet (200 mg total) by mouth daily. 30 tablet 6  . apixaban (ELIQUIS) 5 MG TABS tablet Take 1 tablet (5 mg total) by mouth 2 (two) times daily. 60 tablet 6  . bisoprolol (ZEBETA) 5 MG tablet Take 0.5 tablets (2.5 mg total) by mouth daily. 90 tablet 3  . CALCIUM PO Take 1 tablet by mouth at bedtime.    . Cyanocobalamin (VITAMIN B-12 PO) Take 1 tablet by mouth daily.     Marland Kitchen docusate sodium (COLACE) 100 MG capsule Take 1 capsule (100 mg total) by mouth at bedtime. 10 capsule 0  . DULoxetine (CYMBALTA) 30 MG capsule Take 30 mg by mouth daily.   3  . ferrous sulfate 325 (65 FE) MG tablet Take 1 tablet (325 mg total) by mouth 2 (two) times daily with a meal. 30 tablet 0  . furosemide (LASIX) 40 MG tablet Take 1 tablet (40 mg total) by mouth 2 (two) times daily. 60 tablet 6  . HYDROcodone-acetaminophen (NORCO/VICODIN) 5-325 MG tablet Take 1 tablet by mouth every 6 (six) hours as needed for moderate pain or severe pain. 20 tablet 0  . insulin detemir (LEVEMIR) 100 UNIT/ML injection Inject 10 Units into the skin See admin instructions. 10 units in the morning. May increase as needed to keep fasting BS < 130.    . lidocaine (LIDODERM) 5 % Place 2  patches onto the skin daily. Remove & Discard patch within 12 hours or as directed by MD 30 patch 0  . linagliptin (TRADJENTA) 5 MG TABS tablet Take 1 tablet (5 mg total) by mouth daily. 30 tablet 1  . methocarbamol (ROBAXIN) 750 MG tablet Take 1 tablet (750 mg total) by mouth every 8 (eight) hours as needed for muscle spasms. 30 tablet 1  . nitroGLYCERIN (NITROSTAT) 0.4 MG SL tablet Place 0.4 mg under the tongue every 5 (five) minutes as needed for chest pain.    Marland Kitchen omeprazole (PRILOSEC) 20 MG capsule Take 20 mg by mouth every morning.  7  . polyethylene glycol (MIRALAX / GLYCOLAX) packet Take 17 g by mouth daily. 14 each 0  . pravastatin (PRAVACHOL) 40 MG tablet Take 1 tablet (40 mg total) by mouth daily at 6 PM. 30 tablet 0  . senna-docusate (SENOKOT-S) 8.6-50 MG tablet Take 1 tablet by mouth 2 (two) times daily.    . traMADol (ULTRAM) 50 MG tablet Take 50 mg by mouth 3 (three) times daily as needed for pain.  2   No current facility-administered medications for this visit.      Review of Systems: No fever Good appetite No angina No bleeding problems from the Eliquis  Physical Exam      Exam  General- alert and comfortable    Neck- no JVD, no cervical adenopathy palpable, no carotid bruit   Lungs- clear without rales, wheezes   Cor- regular rate and rhythm, no murmur , gallop   Abdomen- soft, non-tender.  Open wound cleaned, debrided, packed with a small portion of a 4 x 4 wet-to-dry.  It is granulating in and almost healed.  He is weak                  Extremities - warm, non-tender, minimal edema   Neuro- oriented, appropriate, no focal weakness   Diagnostic Tests: None   Impression: Wound healing progressively by secondary intent Continue current wound care 1 more week of oral Keflex  Plan: Return for wound check in 3 weeks Home health nurse will be arranged for home dressing changes and monitoring   Len Childs, MD [facility] [phone]

## 2018-01-25 NOTE — Addendum Note (Signed)
Addended by: Charlena Cross F on: 01/25/2018 12:11 PM   Modules accepted: Orders

## 2018-01-26 DIAGNOSIS — I4891 Unspecified atrial fibrillation: Secondary | ICD-10-CM | POA: Diagnosis not present

## 2018-01-26 DIAGNOSIS — D696 Thrombocytopenia, unspecified: Secondary | ICD-10-CM | POA: Diagnosis not present

## 2018-01-26 DIAGNOSIS — E1151 Type 2 diabetes mellitus with diabetic peripheral angiopathy without gangrene: Secondary | ICD-10-CM | POA: Diagnosis not present

## 2018-01-26 DIAGNOSIS — I739 Peripheral vascular disease, unspecified: Secondary | ICD-10-CM | POA: Diagnosis not present

## 2018-01-30 DIAGNOSIS — T827XXA Infection and inflammatory reaction due to other cardiac and vascular devices, implants and grafts, initial encounter: Secondary | ICD-10-CM | POA: Diagnosis not present

## 2018-01-30 DIAGNOSIS — B9561 Methicillin susceptible Staphylococcus aureus infection as the cause of diseases classified elsewhere: Secondary | ICD-10-CM | POA: Diagnosis not present

## 2018-02-01 DIAGNOSIS — T827XXA Infection and inflammatory reaction due to other cardiac and vascular devices, implants and grafts, initial encounter: Secondary | ICD-10-CM | POA: Diagnosis not present

## 2018-02-01 DIAGNOSIS — B9561 Methicillin susceptible Staphylococcus aureus infection as the cause of diseases classified elsewhere: Secondary | ICD-10-CM | POA: Diagnosis not present

## 2018-02-06 DIAGNOSIS — B9561 Methicillin susceptible Staphylococcus aureus infection as the cause of diseases classified elsewhere: Secondary | ICD-10-CM | POA: Diagnosis not present

## 2018-02-06 DIAGNOSIS — T827XXA Infection and inflammatory reaction due to other cardiac and vascular devices, implants and grafts, initial encounter: Secondary | ICD-10-CM | POA: Diagnosis not present

## 2018-02-08 DIAGNOSIS — T827XXA Infection and inflammatory reaction due to other cardiac and vascular devices, implants and grafts, initial encounter: Secondary | ICD-10-CM | POA: Diagnosis not present

## 2018-02-08 DIAGNOSIS — B9561 Methicillin susceptible Staphylococcus aureus infection as the cause of diseases classified elsewhere: Secondary | ICD-10-CM | POA: Diagnosis not present

## 2018-02-13 DIAGNOSIS — T827XXA Infection and inflammatory reaction due to other cardiac and vascular devices, implants and grafts, initial encounter: Secondary | ICD-10-CM | POA: Diagnosis not present

## 2018-02-13 DIAGNOSIS — B9561 Methicillin susceptible Staphylococcus aureus infection as the cause of diseases classified elsewhere: Secondary | ICD-10-CM | POA: Diagnosis not present

## 2018-02-14 ENCOUNTER — Encounter: Payer: Self-pay | Admitting: Cardiothoracic Surgery

## 2018-02-14 ENCOUNTER — Ambulatory Visit (INDEPENDENT_AMBULATORY_CARE_PROVIDER_SITE_OTHER): Payer: Self-pay | Admitting: Cardiothoracic Surgery

## 2018-02-14 VITALS — BP 104/43 | HR 59 | Resp 20 | Ht 67.0 in | Wt 150.0 lb

## 2018-02-14 DIAGNOSIS — Z09 Encounter for follow-up examination after completed treatment for conditions other than malignant neoplasm: Secondary | ICD-10-CM

## 2018-02-14 DIAGNOSIS — Z5189 Encounter for other specified aftercare: Secondary | ICD-10-CM

## 2018-02-14 NOTE — Progress Notes (Signed)
PCP is Cyndi Bender, PA-C Referring Provider is Cyndi Bender, PA-C  Chief Complaint  Patient presents with  . Routine Post Op    4 week f/u    HPI: 82 year old gentleman returns for scheduled office visit.  He has a complicated and extensive past cardiac history including previous cardiac bypass surgeries, previous AICD placement and previous PCI.  I have been caring for a old AICD generator pocket in his left upper quadrant which became infected after he developed a hematoma from blunt trauma to the area and chronic Eliquis anticoagulation.  The old generator pocket is now healing by secondary intent.  The wound grew out MSSA and he was treated with a course of IV and oral antibiotics/cephalosporin.  Wound is now contracting in and almost entirely clean granulation tissue.  No further antibiotics are needed and daily wet-to-dry dressing changes with saline and small 2 x 2 gauze is being used.  I changed the wound today in the office myself and examined the wound.  It is very clean and almost healed.  We will continue the current wound care plan and home health nursing support.  Today the patient denied chest pain or shortness of breath but has had some dizzy spells.  He remains on a high dose of Lasix 40 mg twice daily and his peripheral edema is now almost resolved.  I told the patient does reduce the Lasix to 40 mg every morning and to stop taking the p.m. dose.  Past Medical History:  Diagnosis Date  . Anemia   . Arthritis   . Atrial fibrillation, persistent ([place]) 12/15/2017  . Cardiomyopathy, ischemic    a. s/p ICD placement  . Chronic combined systolic and diastolic CHF (congestive heart failure) ([place])   . CKD (chronic kidney disease), stage III ([place])   . COPD (chronic obstructive pulmonary disease) ([place])   . Coronary artery disease    a. s/p CABG 1990. b. multiple PCIs since that time including CTO angioplasty (without stenting) PCI of RCA 09/2014, with last cath 02/2015 done for  worsening CHF with unsuccessful attempt at PCI of the RCA due to inability to cross occlusion - medical therapy advised at that time.  . Debilitated 12/15/2017  . GERD (gastroesophageal reflux disease)   . HLD (hyperlipidemia)   . Hypertension   . Occlusion and stenosis of carotid artery without mention of cerebral infarction   . On home oxygen therapy    "2L; 24/7" (11/08/2017)  . Orthostatic hypotension   . Paroxysmal ventricular tachycardia ([place])   . Peripheral vascular disease ([place])   . Raynaud's syndrome   . Stroke [facility])    a. CT head 06/2015: Small old lacunar infarct LEFT basal ganglia.  . Thrombocytopenia ([place])   . TIA (transient ischemic attack)    a. s/p LHC on 10/01/14   . Type II diabetes mellitus ([place])     Past Surgical History:  Procedure Laterality Date  . ANAL FISTULECTOMY  2000's  . APPLICATION OF WOUND VAC Left 12/29/2017   Procedure: APPLICATION OF WOUND VAC;  Surgeon: Ivin Poot, MD;  Location: [place];  Service: Vascular;  Laterality: Left;  . CARDIAC CATHETERIZATION  10/01/2014   PTVA to CTO with restoration of TIMI 3  . CATARACT EXTRACTION, BILATERAL Bilateral ~ 2009  . CHEST EXPLORATION N/A 12/11/2017   Procedure: IRRIGATION AND DEBRIDEMENT OF CHEST HEMATOMA;  Surgeon: Ivin Poot, MD;  Location: [place];  Service: Thoracic;  Laterality: N/A;  . CORONARY ARTERY BYPASS GRAFT  01/1989   "CABG X3"   . FINGER FRACTURE SURGERY Left ~ 2000   "pointer"  . I&D EXTREMITY Left 12/29/2017   Procedure: IRRIGATION AND DEBRIDEMENT ABDOMINAL WALL ABSCESS;  Surgeon: Ivin Poot, MD;  Location: [place];  Service: Vascular;  Laterality: Left;  . IMPLANTABLE CARDIOVERTER DEFIBRILLATOR (ICD) GENERATOR CHANGE Left 10/05/2011   Procedure: ICD GENERATOR CHANGE;  Surgeon: Evans Lance, MD;  Location: [facility] CATH LAB;  Service: Cardiovascular;  Laterality: Left;  . LEFT AND RIGHT HEART CATHETERIZATION WITH CORONARY/GRAFT ANGIOGRAM N/A 04/26/2013   Procedure: LEFT AND RIGHT HEART  CATHETERIZATION WITH Beatrix Fetters;  Surgeon: Sherren Mocha, MD;  Location: [facility] CATH LAB;  Service: Cardiovascular;  Laterality: N/A;  . LEFT HEART CATHETERIZATION WITH CORONARY ANGIOGRAM N/A 08/01/2014   Procedure: LEFT HEART CATHETERIZATION WITH CORONARY ANGIOGRAM;  Surgeon: Sinclair Grooms, MD;  Location: [facility] CATH LAB;  Service: Cardiovascular;  Laterality: N/A;  . LEFT HEART CATHETERIZATION WITH CORONARY ANGIOGRAM N/A 03/20/2015   Procedure: LEFT HEART CATHETERIZATION WITH CORONARY ANGIOGRAM;  Surgeon: Peter M Martinique, MD;  Location: [facility] CATH LAB;  Service: Cardiovascular;  Laterality: N/A;  . PERCUTANEOUS STENT INTERVENTION  04/26/2013   Procedure: PERCUTANEOUS STENT INTERVENTION;  Surgeon: Sherren Mocha, MD;  Location: [facility] CATH LAB;  Service: Cardiovascular;;  . RIGHT HEART CATHETERIZATION  10/01/2014   Procedure: RIGHT HEART CATH;  Surgeon: Peter M Martinique, MD;  Location: [facility] CATH LAB;  Service: Cardiovascular;;  . TEE WITHOUT CARDIOVERSION N/A 01/03/2018   Procedure: TRANSESOPHAGEAL ECHOCARDIOGRAM (TEE);  Surgeon: Sueanne Margarita, MD;  Location: [facility] ENDOSCOPY;  Service: Cardiovascular;  Laterality: N/A;    Family History  Problem Relation Age of Onset  . Heart failure Mother 34  . Heart attack Father   . Leukemia Sister   . Stomach cancer Maternal Grandfather     Social History Social History   Tobacco Use  . Smoking status: Former Smoker    Years: 3.00    Types: Pipe  . Smokeless tobacco: Never Used  . Tobacco comment: "quit smoking pipe in early 1970's"  Substance Use Topics  . Alcohol use: Yes    Comment: 11/08/2017 "wine w/dinner maybe once/yr; or less"  . Drug use: No    Current Outpatient Medications  Medication Sig Dispense Refill  . acetaminophen (TYLENOL) 325 MG tablet Take 2 tablets (650 mg total) by mouth every 4 (four) hours as needed for headache or mild pain.    Marland Kitchen amiodarone (PACERONE) 200 MG tablet Take 1 tablet (200 mg total) by mouth daily. 30 tablet  6  . apixaban (ELIQUIS) 5 MG TABS tablet Take 1 tablet (5 mg total) by mouth 2 (two) times daily. 60 tablet 6  . bisoprolol (ZEBETA) 5 MG tablet Take 0.5 tablets (2.5 mg total) by mouth daily. 90 tablet 3  . CALCIUM PO Take 1 tablet by mouth at bedtime.    . Cyanocobalamin (VITAMIN B-12 PO) Take 1 tablet by mouth daily.     Marland Kitchen docusate sodium (COLACE) 100 MG capsule Take 1 capsule (100 mg total) by mouth at bedtime. 10 capsule 0  . DULoxetine (CYMBALTA) 30 MG capsule Take 30 mg by mouth daily.   3  . ferrous sulfate 325 (65 FE) MG tablet Take 1 tablet (325 mg total) by mouth 2 (two) times daily with a meal. 30 tablet 0  . furosemide (LASIX) 40 MG tablet Take 1 tablet (40 mg total) by mouth 2 (two) times daily. 60 tablet 6  . HYDROcodone-acetaminophen (NORCO/VICODIN) 5-325 MG  tablet Take 1 tablet by mouth every 6 (six) hours as needed for moderate pain or severe pain. 20 tablet 0  . insulin detemir (LEVEMIR) 100 UNIT/ML injection Inject 10 Units into the skin See admin instructions. 10 units in the morning. May increase as needed to keep fasting BS < 130.    . lidocaine (LIDODERM) 5 % Place 2 patches onto the skin daily. Remove & Discard patch within 12 hours or as directed by MD 30 patch 0  . linagliptin (TRADJENTA) 5 MG TABS tablet Take 1 tablet (5 mg total) by mouth daily. 30 tablet 1  . methocarbamol (ROBAXIN) 750 MG tablet Take 1 tablet (750 mg total) by mouth every 8 (eight) hours as needed for muscle spasms. 30 tablet 1  . nitroGLYCERIN (NITROSTAT) 0.4 MG SL tablet Place 0.4 mg under the tongue every 5 (five) minutes as needed for chest pain.    Marland Kitchen omeprazole (PRILOSEC) 20 MG capsule Take 20 mg by mouth every morning.  7  . polyethylene glycol (MIRALAX / GLYCOLAX) packet Take 17 g by mouth daily. 14 each 0  . pravastatin (PRAVACHOL) 40 MG tablet Take 1 tablet (40 mg total) by mouth daily at 6 PM. 30 tablet 0  . senna-docusate (SENOKOT-S) 8.6-50 MG tablet Take 1 tablet by mouth 2 (two) times  daily.    . traMADol (ULTRAM) 50 MG tablet Take 50 mg by mouth 3 (three) times daily as needed for pain.  2   No current facility-administered medications for this visit.     Allergies  Allergen Reactions  . Coreg [Carvedilol] Other (See Comments)    Shortness of breath ,fatigue ,dizzyness   . Lovastatin Other (See Comments)    CK elevation, Myalgias  . Ativan [Lorazepam] Other (See Comments)    Pt. had side effects    Review of Systems  Patient has been living at home now and transition from skilled nursing facility and has been doing well.  His weight is stable no fever.  No falls.  The wound is being changed daily.  BP (!) 104/43   Pulse (!) 59   Resp 20   Ht 5\' 7"  (1.702 m)   Wt 150 lb (68 kg)   SpO2 92% Comment: 3L O2 per [place]  BMI 23.49 kg/m  Physical Exam Elderly frail-appearing male in a wheelchair but in no acute distress Distant breath sounds bilaterally Irregular slow heart rate. No pedal edema. Neuro without focal deficit. Left upper abdomen with open wound which did not require debridement and was repacked with 2 x 2 gauze saline wet-to-dry.  Diagnostic Tests: None  Impression: Healing left upper quadrant old AICD generator pocket after he developed a hematoma than infection with MSSA.  Plan: Continue topical wound care and follow-up appointments for wound check in the office.  Return in 3-4 weeks.  Continue home health nursing.  Orthostatic symptoms from probable overdiuresis-patient will decrease Lasix to once a day.   Len Childs, MD [facility] [phone]

## 2018-02-15 ENCOUNTER — Telehealth: Payer: Self-pay

## 2018-02-15 NOTE — Telephone Encounter (Signed)
Estill Bamberg, nurse with [facility] was contacted and notified of continuation of wound care 2 days a week.

## 2018-02-16 DIAGNOSIS — B9561 Methicillin susceptible Staphylococcus aureus infection as the cause of diseases classified elsewhere: Secondary | ICD-10-CM | POA: Diagnosis not present

## 2018-02-16 DIAGNOSIS — T827XXA Infection and inflammatory reaction due to other cardiac and vascular devices, implants and grafts, initial encounter: Secondary | ICD-10-CM | POA: Diagnosis not present

## 2018-02-20 DIAGNOSIS — B9561 Methicillin susceptible Staphylococcus aureus infection as the cause of diseases classified elsewhere: Secondary | ICD-10-CM | POA: Diagnosis not present

## 2018-02-20 DIAGNOSIS — T827XXA Infection and inflammatory reaction due to other cardiac and vascular devices, implants and grafts, initial encounter: Secondary | ICD-10-CM | POA: Diagnosis not present

## 2018-02-21 DIAGNOSIS — I255 Ischemic cardiomyopathy: Secondary | ICD-10-CM | POA: Diagnosis not present

## 2018-02-21 DIAGNOSIS — J9611 Chronic respiratory failure with hypoxia: Secondary | ICD-10-CM | POA: Diagnosis not present

## 2018-02-21 DIAGNOSIS — M5030 Other cervical disc degeneration, unspecified cervical region: Secondary | ICD-10-CM | POA: Diagnosis not present

## 2018-02-21 DIAGNOSIS — Z79899 Other long term (current) drug therapy: Secondary | ICD-10-CM | POA: Diagnosis not present

## 2018-02-21 DIAGNOSIS — E1149 Type 2 diabetes mellitus with other diabetic neurological complication: Secondary | ICD-10-CM | POA: Diagnosis not present

## 2018-02-21 DIAGNOSIS — Z6824 Body mass index (BMI) 24.0-24.9, adult: Secondary | ICD-10-CM | POA: Diagnosis not present

## 2018-02-21 DIAGNOSIS — I4891 Unspecified atrial fibrillation: Secondary | ICD-10-CM | POA: Diagnosis not present

## 2018-02-21 DIAGNOSIS — M5136 Other intervertebral disc degeneration, lumbar region: Secondary | ICD-10-CM | POA: Diagnosis not present

## 2018-02-23 DIAGNOSIS — T827XXA Infection and inflammatory reaction due to other cardiac and vascular devices, implants and grafts, initial encounter: Secondary | ICD-10-CM | POA: Diagnosis not present

## 2018-02-23 DIAGNOSIS — B9561 Methicillin susceptible Staphylococcus aureus infection as the cause of diseases classified elsewhere: Secondary | ICD-10-CM | POA: Diagnosis not present

## 2018-02-26 DIAGNOSIS — T827XXA Infection and inflammatory reaction due to other cardiac and vascular devices, implants and grafts, initial encounter: Secondary | ICD-10-CM | POA: Diagnosis not present

## 2018-02-26 DIAGNOSIS — B9561 Methicillin susceptible Staphylococcus aureus infection as the cause of diseases classified elsewhere: Secondary | ICD-10-CM | POA: Diagnosis not present

## 2018-02-28 ENCOUNTER — Telehealth: Payer: Self-pay | Admitting: Cardiology

## 2018-02-28 ENCOUNTER — Other Ambulatory Visit: Payer: Self-pay | Admitting: Cardiology

## 2018-02-28 DIAGNOSIS — I255 Ischemic cardiomyopathy: Secondary | ICD-10-CM

## 2018-02-28 MED ORDER — BISOPROLOL FUMARATE 5 MG PO TABS: 2.5000 mg | ORAL_TABLET | Freq: Every day | ORAL | 0 refills | Status: AC

## 2018-02-28 MED ORDER — APIXABAN 5 MG PO TABS: 5.0000 mg | ORAL_TABLET | Freq: Two times a day (BID) | ORAL | 1 refills | Status: DC

## 2018-02-28 NOTE — Telephone Encounter (Signed)
Returned call to patient he stated he needed refills for Eliquis and Bisoprolol.Bisoprolol refill sent to [facility] in [place].Follow up appointment scheduled with Dr.Jordan 03/29/18 at 1:40 pm.Message sent to pharmacy for Eliquis refill.

## 2018-02-28 NOTE — Telephone Encounter (Signed)
New Message    Patient states that he is returning call. But he does not know what its about. Just a message to call the office. Please call to discuss. He said to call after 2pm.

## 2018-02-28 NOTE — Telephone Encounter (Signed)
Received fax for refill of pt's Bisoprolol 5mg  for [facility] in [facility]. Pt has had this filled at different pharmacies. Left vm to call back to verify pharmacy and to inform pt that he is due for a follow up appt.

## 2018-03-01 DIAGNOSIS — B9561 Methicillin susceptible Staphylococcus aureus infection as the cause of diseases classified elsewhere: Secondary | ICD-10-CM | POA: Diagnosis not present

## 2018-03-01 DIAGNOSIS — T827XXA Infection and inflammatory reaction due to other cardiac and vascular devices, implants and grafts, initial encounter: Secondary | ICD-10-CM | POA: Diagnosis not present

## 2018-03-06 ENCOUNTER — Telehealth: Payer: Self-pay

## 2018-03-06 DIAGNOSIS — B9561 Methicillin susceptible Staphylococcus aureus infection as the cause of diseases classified elsewhere: Secondary | ICD-10-CM | POA: Diagnosis not present

## 2018-03-06 DIAGNOSIS — T827XXA Infection and inflammatory reaction due to other cardiac and vascular devices, implants and grafts, initial encounter: Secondary | ICD-10-CM | POA: Diagnosis not present

## 2018-03-06 NOTE — Telephone Encounter (Signed)
Victor [name] [phone] the Home Health nurse with Victor [name] called to advise that Victor [name]'s wound has completely healed and is going to proceed with discharge from [facility].  She stated that she left the wound open to air.  She stated that his weight has been trending down and dependant edema is minimal.  She stated that his blood sugars have been within normal range.  He is due to come back to see Dr. Prescott Gum on 03/28/2018.

## 2018-03-15 ENCOUNTER — Other Ambulatory Visit: Payer: Self-pay

## 2018-03-15 ENCOUNTER — Encounter: Payer: Self-pay | Admitting: Cardiology

## 2018-03-15 MED ORDER — NITROGLYCERIN 0.4 MG SL SUBL: 0.4000 mg | SUBLINGUAL_TABLET | SUBLINGUAL | 11 refills | Status: AC | PRN

## 2018-03-16 ENCOUNTER — Telehealth: Payer: Self-pay | Admitting: Cardiology

## 2018-03-16 NOTE — Telephone Encounter (Signed)
Want to know if you received fax for portable oxygen concentrator aka POC please call to advise if fax was received.

## 2018-03-16 NOTE — Telephone Encounter (Signed)
Left message to call back  

## 2018-03-19 NOTE — Telephone Encounter (Signed)
Returned call to [place] with [employer] left message on personal voice mail I never received form for O2 concentrator.Advised to refax.

## 2018-03-22 NOTE — Telephone Encounter (Signed)
Returned call to [place] with [employer] no answer.[place].

## 2018-03-27 NOTE — Progress Notes (Deleted)
Cardiology Office Note   Date:  03/27/2018   ID:  Victor [name], DOB 07/23/1936, MRN [id number]  PCP:  Cyndi Bender, PA-C  Cardiologist:  Dr. Peter Martinique   Electrophysiologist:  Dr. Cristopher Peru    No chief complaint on file.    History of Present Illness: Victor [name] is a 82 y.o. male is seen for follow up CAD and CHF. He has a hx of CAD, status post CABG in 1990 with known occlusion of both vein grafts, PCI to the SVG-OM3 in 2014 and attempted CTO PCI of the RCA 09/2014. Chronic combined systolic and diastolic CHF, ischemic cardiomyopathy with history of VT in 2012, status post ICD, diabetes, CKD, HTN, HL, PAD, carotid stenosis.  Admitted 02/2015 with acute CHF exacerbation. He had new EKG changes and underwent LHC with unsuccessful repeat attempt at PCI of CTO RCA. Medical therapy was continued.  Admitted 8/7-06/30/15 with acute on chronic combined systolic and diastolic CHF. Patient has a history of orthostatic hypotension.   Follow-up echo demonstrated no significant change with an EF of 35-40%.   Seen by Kerin Ransom PA in November and December 2017 for titration of medications. 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 At one time they had 20 horses,  now down to 5.  The pt's wife was  killed in a MVA last year. He now lives alone but has family help. Attempted to get him on Entresto  09/23/16 but unable to tolerate due to hypotension. He also has chronic combined systolic + diastolic CHF with EF of 83% by echo Aug 2016. He had VT in 2012 and is S/p BS ICD followed by Dr Lovena Le.  When last seen his aldactone and lisinopril were held due to hypotension. Later started on low dose lisinopril.  He was  admitted to [facility] on 6/13-6/14/18 with CHF exacerbation. CXR showed interstitial edema. Pro BNP > 6000. Echo showed stable EF 35%- akinesis of the inferior and posterior wall, mild to mod MR. Pulmonary HTN with estimated RV systolic pressure of 44 mm Hg. He was  treated with IV lasix with improvement and discharged the following day on prior regimen. Hbg was 10.1.  He was then admitted to the hospital in [place] in June after falling at home. Hit his head hard on the floor. Had a scalp hematoma and abrasion left face and ear. Injured his arm and foot but no fracture. Reports CT of head, neck and spine were OK. Discharged on Sunday. Doesn't recall events prior to fall. Remembers getting up to go to [place]. Next thing he knew son found him on sofa with blood all around. In hospital his insulin was stopped. Other meds continued. He was transitioned to lasix 40 mg po bid. Losartan 25 mg daily resumed at DC. DC weight was 152 lbs.  He was admitted here from 8/26-8/30/18 for acute CHF exacerbation, multiple falls, hyponatremia, and ARF. Creatinine rose to 2.37. Hgb was down to 7.4. He was transfused 1 unit PRBC. Diuresed with IV lasix. Echo was unchanged. ACEi/ARB were held. He was transitioned to lasix 40 mg bid po and losartan 25 mg daily resumed at DC. Creatinine improved to 1.06. Hgb to 8.7. Sodium improved from 126>>138.  He was admitted in December 2018 with CHF exacerbation. Troponin mildly elevated but felt to be due to demand ischemia.   Readmitted in January 2019. Had chest pain and mildly elevated troponin. CT at that time showed fluid collection around  epicardial ICD site.  Dr. Prescott Gum and ID-Dr. Megan Salon  were consulted as well.  ABXs were held.   Felt this was non-infected hematoma around epicardial wires from previous PPM in abdominal cavity.  Surgical drainage was planned and his Eliquis was stopped and IV heparin started.  On 12/11/17 pt underwent irrigation and debridement of chest hematoma.  He was admitted 2/5-2/20/19 for management of rectus sheath abdominal wall abscess. This was an old AICD generator pocket in his left upper quadrant which became infected after he developed a hematoma from blunt trauma to the area and chronic Eliquis anticoagulation.   The old generator pocket is now healing by secondary intent.   He was empirically treated with broad spectrum IV AB. Underwent I&D on 2/8 with wound VAC placement by CT surgery. Intraoperative sensitivities showed MSSA. TEE was done, negative for vegetation. Plan was to continue outpatient antibiotics for another 2 weeks. Last seen by Dr. Prescott Gum on 02/16/18.   On follow up today he states he is now living with his grandson in [place]. His breathing is doing well. He has gained 6-7 lbs since DC. Reports he loves eating hot dogs with relish and mustard- his favorite food. Denies any increase in edema. Appetite has improved. Energy level is much better and leg weakness improved. No falls. Compliant with medication.   Past Medical History:  Diagnosis Date  . Anemia   . Arthritis   . Atrial fibrillation, persistent ([place]) 12/15/2017  . Cardiomyopathy, ischemic    a. s/p ICD placement  . Chronic combined systolic and diastolic CHF (congestive heart failure) ([place])   . CKD (chronic kidney disease), stage III ([place])   . COPD (chronic obstructive pulmonary disease) ([place])   . Coronary artery disease    a. s/p CABG 1990. b. multiple PCIs since that time including CTO angioplasty (without stenting) PCI of RCA 09/2014, with last cath 02/2015 done for worsening CHF with unsuccessful attempt at PCI of the RCA due to inability to cross occlusion - medical therapy advised at that time.  . Debilitated 12/15/2017  . GERD (gastroesophageal reflux disease)   . HLD (hyperlipidemia)   . Hypertension   . Occlusion and stenosis of carotid artery without mention of cerebral infarction   . On home oxygen therapy    "2L; 24/7" (11/08/2017)  . Orthostatic hypotension   . Paroxysmal ventricular tachycardia ([place])   . Peripheral vascular disease ([place])   . Raynaud's syndrome   . Stroke [facility])    a. CT head 06/2015: Small old lacunar infarct LEFT basal ganglia.  . Thrombocytopenia ([place])   . TIA (transient ischemic  attack)    a. s/p LHC on 10/01/14   . Type II diabetes mellitus ([place])     Past Surgical History:  Procedure Laterality Date  . ANAL FISTULECTOMY  2000's  . APPLICATION OF WOUND VAC Left 12/29/2017   Procedure: APPLICATION OF WOUND VAC;  Surgeon: Ivin Poot, MD;  Location: [place];  Service: Vascular;  Laterality: Left;  . CARDIAC CATHETERIZATION  10/01/2014   PTVA to CTO with restoration of TIMI 3  . CATARACT EXTRACTION, BILATERAL Bilateral ~ 2009  . CHEST EXPLORATION N/A 12/11/2017   Procedure: IRRIGATION AND DEBRIDEMENT OF CHEST HEMATOMA;  Surgeon: Ivin Poot, MD;  Location: [place];  Service: Thoracic;  Laterality: N/A;  . CORONARY ARTERY BYPASS GRAFT  01/1989   "CABG X3"   . FINGER FRACTURE SURGERY Left ~ 2000   "pointer"  . I&D EXTREMITY Left  12/29/2017   Procedure: IRRIGATION AND DEBRIDEMENT ABDOMINAL WALL ABSCESS;  Surgeon: Ivin Poot, MD;  Location: [place];  Service: Vascular;  Laterality: Left;  . IMPLANTABLE CARDIOVERTER DEFIBRILLATOR (ICD) GENERATOR CHANGE Left 10/05/2011   Procedure: ICD GENERATOR CHANGE;  Surgeon: Evans Lance, MD;  Location: [facility] CATH LAB;  Service: Cardiovascular;  Laterality: Left;  . LEFT AND RIGHT HEART CATHETERIZATION WITH CORONARY/GRAFT ANGIOGRAM N/A 04/26/2013   Procedure: LEFT AND RIGHT HEART CATHETERIZATION WITH Beatrix Fetters;  Surgeon: Sherren Mocha, MD;  Location: [facility] C[facility] LAB;  Service: Cardiovascular;  Laterality: N/A;  . LEFT HEART CATHETERIZATION WITH CORONARY ANGIOGRAM N/A 08/01/2014   Procedure: LEFT HEART CATHETERIZATION WITH CORONARY ANGIOGRAM;  Surgeon: Sinclair Grooms, MD;  Location: [facility] CATH LAB;  Service: Cardiovascular;  Laterality: N/A;  . LEFT HEART CATHETERIZATION WITH CORONARY ANGIOGRAM N/A 03/20/2015   Procedure: LEFT HEART CATHETERIZATION WITH CORONARY ANGIOGRAM;  Surgeon: Peter M Martinique, MD;  Location: [facility] CATH LAB;  Service: Cardiovascular;  Laterality: N/A;  . PERCUTANEOUS STENT INTERVENTION  04/26/2013    Procedure: PERCUTANEOUS STENT INTERVENTION;  Surgeon: Sherren Mocha, MD;  Location: [facility] CATH LAB;  Service: Cardiovascular;;  . RIGHT HEART CATHETERIZATION  10/01/2014   Procedure: RIGHT HEART CATH;  Surgeon: Peter M Martinique, MD;  Location: [facility] CATH LAB;  Service: Cardiovascular;;  . TEE WITHOUT CARDIOVERSION N/A 01/03/2018   Procedure: TRANSESOPHAGEAL ECHOCARDIOGRAM (TEE);  Surgeon: Sueanne Margarita, MD;  Location: [facility] ENDOSCOPY;  Service: Cardiovascular;  Laterality: N/A;     Current Outpatient Medications  Medication Sig Dispense Refill  . acetaminophen (TYLENOL) 325 MG tablet Take 2 tablets (650 mg total) by mouth every 4 (four) hours as needed for headache or mild pain.    Marland Kitchen amiodarone (PACERONE) 200 MG tablet Take 1 tablet (200 mg total) by mouth daily. 30 tablet 6  . apixaban (ELIQUIS) 5 MG TABS tablet Take 1 tablet (5 mg total) by mouth 2 (two) times daily. 180 tablet 1  . bisoprolol (ZEBETA) 5 MG tablet Take 0.5 tablets (2.5 mg total) by mouth daily. 45 tablet 0  . CALCIUM PO Take 1 tablet by mouth at bedtime.    . Cyanocobalamin (VITAMIN B-12 PO) Take 1 tablet by mouth daily.     Marland Kitchen docusate sodium (COLACE) 100 MG capsule Take 1 capsule (100 mg total) by mouth at bedtime. 10 capsule 0  . DULoxetine (CYMBALTA) 30 MG capsule Take 30 mg by mouth daily.   3  . ferrous sulfate 325 (65 FE) MG tablet Take 1 tablet (325 mg total) by mouth 2 (two) times daily with a meal. 30 tablet 0  . furosemide (LASIX) 40 MG tablet Take 1 tablet (40 mg total) by mouth 2 (two) times daily. 60 tablet 6  . HYDROcodone-acetaminophen (NORCO/VICODIN) 5-325 MG tablet Take 1 tablet by mouth every 6 (six) hours as needed for moderate pain or severe pain. 20 tablet 0  . insulin detemir (LEVEMIR) 100 UNIT/ML injection Inject 10 Units into the skin See admin instructions. 10 units in the morning. May increase as needed to keep fasting BS < 130.    . lidocaine (LIDODERM) 5 % Place 2 patches onto the skin daily. Remove &  Discard patch within 12 hours or as directed by MD 30 patch 0  . linagliptin (TRADJENTA) 5 MG TABS tablet Take 1 tablet (5 mg total) by mouth daily. 30 tablet 1  . methocarbamol (ROBAXIN) 750 MG tablet Take 1 tablet (750 mg total) by mouth every 8 (eight) hours as needed  for muscle spasms. 30 tablet 1  . nitroGLYCERIN (NITROSTAT) 0.4 MG SL tablet Place 1 tablet (0.4 mg total) under the tongue every 5 (five) minutes as needed for chest pain. 25 tablet 11  . omeprazole (PRILOSEC) 20 MG capsule Take 20 mg by mouth every morning.  7  . polyethylene glycol (MIRALAX / GLYCOLAX) packet Take 17 g by mouth daily. 14 each 0  . pravastatin (PRAVACHOL) 40 MG tablet Take 1 tablet (40 mg total) by mouth daily at 6 PM. 30 tablet 0  . senna-docusate (SENOKOT-S) 8.6-50 MG tablet Take 1 tablet by mouth 2 (two) times daily.    . traMADol (ULTRAM) 50 MG tablet Take 50 mg by mouth 3 (three) times daily as needed for pain.  2   No current facility-administered medications for this visit.     Allergies:   Coreg [carvedilol]; Lovastatin; and Ativan [lorazepam]    Social History:  The patient  reports that he has quit smoking. His smoking use included pipe. He quit after 3.00 years of use. He has never used smokeless tobacco. He reports that he drinks alcohol. He reports that he does not use drugs.   Family History:  The patient's family history includes Heart attack in his father; Heart failure (age of onset: 45) in his mother; Leukemia in his sister; Stomach cancer in his maternal grandfather.    ROS:   Please see the history of present illness.   Review of Systems  Constitution: Positive for weight gain.  Cardiovascular: Positive for dyspnea on exertion.  Respiratory: Positive for shortness of breath. Negative for wheezing.   Musculoskeletal: Positive for neck pain. Negative for muscle weakness.  Gastrointestinal: Negative for bloating.  Neurological: Negative for loss of balance.  All other systems reviewed  and are negative.    PHYSICAL EXAM: VS:  There were no vitals taken for this visit.    Wt Readings from Last 3 Encounters:  02/14/18 150 lb (68 kg)  01/24/18 154 lb (69.9 kg)  01/17/18 154 lb (69.9 kg)     GEN: chronically ill appearing, in no acute distress  HEENT:  PERRL, EOMI, sclera are clear. Oropharynx is clear. NECK:  No jugular venous distention, carotid upstroke brisk and symmetric, no bruits, no thyromegaly or adenopathy LUNGS:  Clear to auscultation bilaterally CHEST:  Unremarkable HEART:  RRR,  PMI not displaced or sustained,S1 and S2 within normal limits, no S3, no S4: no clicks, no rubs, no murmurs ABD:  Soft, nontender. BS +, no masses or bruits. No hepatomegaly, no splenomegaly EXT:  2 + pulses throughout, no edema, no cyanosis no clubbing SKIN:  Warm and dry.  No rashes NEURO:  Alert and oriented x 3. Cranial nerves II through XII intact. PSYCH:  Cognitively intact  EKG:  EKG is  ordered today. NSR with PVCs. First degree AV block. Old inferior infarct. I have personally reviewed and interpreted this study.   Recent Labs: 11/08/2017: B Natriuretic Peptide 796.6 12/07/2017: TSH 0.411 12/28/2017: ALT 13 01/10/2018: BUN 12; Creatinine, Ser 1.29; Hemoglobin 8.3; Magnesium 1.9; Platelets 212; Potassium 4.1; Sodium 138  05/18/17: A1c 10.5. CMET normal.   Lipid Panel    Component Value Date/Time   CHOL 127 06/29/2015 0508   TRIG 111 06/29/2015 0508   HDL 36 (L) 06/29/2015 0508   CHOLHDL 3.5 06/29/2015 0508   VLDL 22 06/29/2015 0508   LDLCALC 69 06/29/2015 0508   Labs dated 05/12/16: cholesterol 127, triglycerides 166, HDL 48, LDL 46.  Dated 08/10/16: A1c 7.7%.  Dated 02/21/18: A1c 6.5%. Hgb 9.6. Creatinine 1.24. Other chemistries normal.  Echo: 12/07/17: Study Conclusions  - Left ventricle: The cavity size was normal. Systolic function was   moderately to severely reduced. The estimated ejection fraction   was in the range of 30% to 35%. Diffuse hypokinesis. -  Ventricular septum: Septal motion showed paradox. - Aortic valve: There was no regurgitation. - Mitral valve: Calcified annulus. Mildly thickened leaflets .   There was mild regurgitation. - Left atrium: The atrium was normal in size. - Right atrium: The atrium was mildly dilated. - Tricuspid valve: There was moderate regurgitation. - Pulmonic valve: There was no regurgitation. - Pulmonary arteries: Systolic pressure was moderately increased.   PA peak pressure: 50 mm Hg (S). - Inferior vena cava: The vessel was dilated. The respirophasic   diameter changes were blunted (< 50%), consistent with elevated   central venous pressure. - Pericardium, extracardiac: There was no pericardial effusion.  Impressions:  - Moderate to severe LV systolic dysfunction with diffuse   hypokinesis, akinesis of the basal and mid inferior and   inferolateral walls and paradoxical septal motion. LVEF 30-35%.   RVEF normal, moderate pulmonary hypertension.  TEE 01/03/18: Study Conclusions  - Left ventricle: Systolic function was moderately to severely   reduced. The estimated ejection fraction was in the range of 30%   to 35%. Wall motion was normal; there were no regional wall   motion abnormalities. - Mitral valve: Mildly calcified annulus. Mildly thickened leaflets   . There was mild to moderate central regurgitation secondary to   mitral annular dilatation from LV dysfuction - Left atrium: No evidence of thrombus in the atrial cavity or   appendage. - Right atrium: The atrium was mildly dilated. No evidence of   thrombus in the atrial cavity or appendage. - Tricuspid valve: There was moderate regurgitation. - Line: A venous pacing wire was visualized in the right atrial   cavity and right ventricular cavity, with its tip at the RV apex.  Impressions:  - No cardiac source of emboli was indentified.   ASSESSMENT AND PLAN:  1. Chronic combined systolic and diastolic CHF:  I am concerned  about weight gain since hospital DC but he has no edema on exam and lungs are clear.  Will continue lasix at current dose. Continue losartan. Will check BMET and BNP today. Stressed importance of dietary compliance with sodium restriction. Will arrange follow up in 2 months.  2. Cardiomyopathy, ischemic:  Last EF 35-40% by echocardiogram. Continue beta blocker, ARB. Intolerant of Entresto due to hypotension.  3. Coronary artery disease involving native coronary artery of native heart without angina pectoris:   He had a  heart catheterization demonstrating severe 2 vessel CAD with chronic occlusion of the obtuse marginal and RCA with unsuccessful attempt at CTO  PCI of the RCA. Continue beta blocker, ASA, statin. He is not a candidate for further revascularization. I have recommended stopping Plavix with recent findings of significant anemia to minimize risk of bleeding.  4. ICD site hematoma and subsequent abscess. S/p surgical debridement.   5. Orthostatic hypotension:  Continue  compression stockings to help with his orthostatic hypotension. Adjustment in meds as noted above.   6. Hyperlipidemia:    continue statin.  7. ICD- BS Nov 2012 for VT:   8. Type 2 diabetes mellitus with other circulatory complications:  Follow-up with primary care.  9. CKD (chronic kidney disease), class 3.  BMET today.   10. Back pain/cervical pain. He  is a poor candidate for surgery with multiple co-morbidities and general poor health. Recommend conservative therapy.  11. Anemia secondary to chronic disease. Check CBC today.   12. Hyponatremia. Improved with diuresis. Check today.  13. HTN     Labs/ tests ordered today include:   No orders of the defined types were placed in this encounter.    Signed, Peter Martinique MD, [facility]    03/27/2018 8:41 AM

## 2018-03-28 ENCOUNTER — Encounter: Payer: Self-pay | Admitting: Cardiothoracic Surgery

## 2018-03-28 ENCOUNTER — Ambulatory Visit (INDEPENDENT_AMBULATORY_CARE_PROVIDER_SITE_OTHER): Payer: Self-pay | Admitting: Cardiothoracic Surgery

## 2018-03-28 VITALS — BP 104/55 | HR 62 | Resp 20 | Ht 67.0 in | Wt 140.0 lb

## 2018-03-28 DIAGNOSIS — Z09 Encounter for follow-up examination after completed treatment for conditions other than malignant neoplasm: Secondary | ICD-10-CM

## 2018-03-28 DIAGNOSIS — Z5189 Encounter for other specified aftercare: Secondary | ICD-10-CM

## 2018-03-28 MED ORDER — DOXYCYCLINE HYCLATE 100 MG PO TABS: 100.0000 mg | ORAL_TABLET | Freq: Two times a day (BID) | ORAL | 0 refills | Status: DC

## 2018-03-28 NOTE — Progress Notes (Signed)
PCP is Cyndi Bender, PA-C Referring Provider is Minus Breeding, MD  Chief Complaint  Patient presents with  . Routine Post Op    4 week f/u wound check,      HPI: Final postop check after drainage of left upper quadrant hematoma and old AICD generator site.  He developed a MSSA infection which was treated with debridement wound VAC and antibiotics.  This is now healed.  He denies pain at the site.  He denies drainage.  Past Medical History:  Diagnosis Date  . Anemia   . Arthritis   . Atrial fibrillation, persistent ([place]) 12/15/2017  . Cardiomyopathy, ischemic    a. s/p ICD placement  . Chronic combined systolic and diastolic CHF (congestive heart failure) ([place])   . CKD (chronic kidney disease), stage III ([place])   . COPD (chronic obstructive pulmonary disease) ([place])   . Coronary artery disease    a. s/p CABG 1990. b. multiple PCIs since that time including CTO angioplasty (without stenting) PCI of RCA 09/2014, with last cath 02/2015 done for worsening CHF with unsuccessful attempt at PCI of the RCA due to inability to cross occlusion - medical therapy advised at that time.  . Debilitated 12/15/2017  . GERD (gastroesophageal reflux disease)   . HLD (hyperlipidemia)   . Hypertension   . Occlusion and stenosis of carotid artery without mention of cerebral infarction   . On home oxygen therapy    "2L; 24/7" (11/08/2017)  . Orthostatic hypotension   . Paroxysmal ventricular tachycardia ([place])   . Peripheral vascular disease ([place])   . Raynaud's syndrome   . Stroke [facility])    a. CT head 06/2015: Small old lacunar infarct LEFT basal ganglia.  . Thrombocytopenia ([place])   . TIA (transient ischemic attack)    a. s/p LHC on 10/01/14   . Type II diabetes mellitus ([place])     Past Surgical History:  Procedure Laterality Date  . ANAL FISTULECTOMY  2000's  . APPLICATION OF WOUND VAC Left 12/29/2017   Procedure: APPLICATION OF WOUND VAC;  Surgeon: Ivin Poot, MD;  Location: [place];  Service:  Vascular;  Laterality: Left;  . CARDIAC CATHETERIZATION  10/01/2014   PTVA to CTO with restoration of TIMI 3  . CATARACT EXTRACTION, BILATERAL Bilateral ~ 2009  . CHEST EXPLORATION N/A 12/11/2017   Procedure: IRRIGATION AND DEBRIDEMENT OF CHEST HEMATOMA;  Surgeon: Ivin Poot, MD;  Location: [place];  Service: Thoracic;  Laterality: N/A;  . CORONARY ARTERY BYPASS GRAFT  01/1989   "CABG X3"   . FINGER FRACTURE SURGERY Left ~ 2000   "pointer"  . I&D EXTREMITY Left 12/29/2017   Procedure: IRRIGATION AND DEBRIDEMENT ABDOMINAL WALL ABSCESS;  Surgeon: Ivin Poot, MD;  Location: [place];  Service: Vascular;  Laterality: Left;  . IMPLANTABLE CARDIOVERTER DEFIBRILLATOR (ICD) GENERATOR CHANGE Left 10/05/2011   Procedure: ICD GENERATOR CHANGE;  Surgeon: Evans Lance, MD;  Location: [facility] CATH LAB;  Service: Cardiovascular;  Laterality: Left;  . LEFT AND RIGHT HEART CATHETERIZATION WITH CORONARY/GRAFT ANGIOGRAM N/A 04/26/2013   Procedure: LEFT AND RIGHT HEART CATHETERIZATION WITH Beatrix Fetters;  Surgeon: Sherren Mocha, MD;  Location: [facility] CATH LAB;  Service: Cardiovascular;  Laterality: N/A;  . LEFT HEART CATHETERIZATION WITH CORONARY ANGIOGRAM N/A 08/01/2014   Procedure: LEFT HEART CATHETERIZATION WITH CORONARY ANGIOGRAM;  Surgeon: Sinclair Grooms, MD;  Location: [facility] CATH LAB;  Service: Cardiovascular;  Laterality: N/A;  . LEFT HEART CATHETERIZATION WITH CORONARY ANGIOGRAM N/A 03/20/2015   Procedure:  LEFT HEART CATHETERIZATION WITH CORONARY ANGIOGRAM;  Surgeon: Peter M Martinique, MD;  Location: [facility] CATH LAB;  Service: Cardiovascular;  Laterality: N/A;  . PERCUTANEOUS STENT INTERVENTION  04/26/2013   Procedure: PERCUTANEOUS STENT INTERVENTION;  Surgeon: Sherren Mocha, MD;  Location: [facility] CATH LAB;  Service: Cardiovascular;;  . RIGHT HEART CATHETERIZATION  10/01/2014   Procedure: RIGHT HEART CATH;  Surgeon: Peter M Martinique, MD;  Location: [facility] CATH LAB;  Service: Cardiovascular;;  . TEE WITHOUT  CARDIOVERSION N/A 01/03/2018   Procedure: TRANSESOPHAGEAL ECHOCARDIOGRAM (TEE);  Surgeon: Sueanne Margarita, MD;  Location: [facility] ENDOSCOPY;  Service: Cardiovascular;  Laterality: N/A;    Family History  Problem Relation Age of Onset  . Heart failure Mother 60  . Heart attack Father   . Leukemia Sister   . Stomach cancer Maternal Grandfather     Social History Social History   Tobacco Use  . Smoking status: Former Smoker    Years: 3.00    Types: Pipe  . Smokeless tobacco: Never Used  . Tobacco comment: "quit smoking pipe in early 1970's"  Substance Use Topics  . Alcohol use: Yes    Comment: 11/08/2017 "wine w/dinner maybe once/yr; or less"  . Drug use: No    Current Outpatient Medications  Medication Sig Dispense Refill  . acetaminophen (TYLENOL) 325 MG tablet Take 2 tablets (650 mg total) by mouth every 4 (four) hours as needed for headache or mild pain.    Marland Kitchen amiodarone (PACERONE) 200 MG tablet Take 1 tablet (200 mg total) by mouth daily. 30 tablet 6  . apixaban (ELIQUIS) 5 MG TABS tablet Take 1 tablet (5 mg total) by mouth 2 (two) times daily. 180 tablet 1  . bisoprolol (ZEBETA) 5 MG tablet Take 0.5 tablets (2.5 mg total) by mouth daily. 45 tablet 0  . CALCIUM PO Take 1 tablet by mouth at bedtime.    . Cyanocobalamin (VITAMIN B-12 PO) Take 1 tablet by mouth daily.     Marland Kitchen docusate sodium (COLACE) 100 MG capsule Take 1 capsule (100 mg total) by mouth at bedtime. 10 capsule 0  . DULoxetine (CYMBALTA) 30 MG capsule Take 30 mg by mouth daily.   3  . ferrous sulfate 325 (65 FE) MG tablet Take 1 tablet (325 mg total) by mouth 2 (two) times daily with a meal. 30 tablet 0  . HYDROcodone-acetaminophen (NORCO/VICODIN) 5-325 MG tablet Take 1 tablet by mouth every 6 (six) hours as needed for moderate pain or severe pain. 20 tablet 0  . insulin detemir (LEVEMIR) 100 UNIT/ML injection Inject 10 Units into the skin See admin instructions. 10 units in the morning. May increase as needed to keep  fasting BS < 130.    . lidocaine (LIDODERM) 5 % Place 2 patches onto the skin daily. Remove & Discard patch within 12 hours or as directed by MD 30 patch 0  . linagliptin (TRADJENTA) 5 MG TABS tablet Take 1 tablet (5 mg total) by mouth daily. 30 tablet 1  . methocarbamol (ROBAXIN) 750 MG tablet Take 1 tablet (750 mg total) by mouth every 8 (eight) hours as needed for muscle spasms. 30 tablet 1  . nitroGLYCERIN (NITROSTAT) 0.4 MG SL tablet Place 1 tablet (0.4 mg total) under the tongue every 5 (five) minutes as needed for chest pain. 25 tablet 11  . omeprazole (PRILOSEC) 20 MG capsule Take 20 mg by mouth every morning.  7  . polyethylene glycol (MIRALAX / GLYCOLAX) packet Take 17 g by mouth daily. 14 each 0  .  pravastatin (PRAVACHOL) 40 MG tablet Take 1 tablet (40 mg total) by mouth daily at 6 PM. 30 tablet 0  . senna-docusate (SENOKOT-S) 8.6-50 MG tablet Take 1 tablet by mouth 2 (two) times daily.    . traMADol (ULTRAM) 50 MG tablet Take 50 mg by mouth 3 (three) times daily as needed for pain.  2  . doxycycline (VIBRA-TABS) 100 MG tablet Take 1 tablet (100 mg total) by mouth 2 (two) times daily. 14 tablet 0  . furosemide (LASIX) 40 MG tablet Take 1 tablet (40 mg total) by mouth 2 (two) times daily. 60 tablet 6   No current facility-administered medications for this visit.     Allergies  Allergen Reactions  . Coreg [Carvedilol] Other (See Comments)    Shortness of breath ,fatigue ,dizzyness   . Lovastatin Other (See Comments)    CK elevation, Myalgias  . Ativan [Lorazepam] Other (See Comments)    Pt. had side effects    Review of Systems  Weight stable at 140 pounds No fever No ankle edema 1 recent fall down the stairs where he bruised his left elbow without long-term symptoms Patient is on 3 L nasal cannula oxygen BP (!) 104/55   Pulse 62   Resp 20   Ht 5\' 7"  (1.702 m)   Wt 140 lb (63.5 kg)   SpO2 94% Comment: 3L O2 per [place]  BMI 21.93 kg/m  Physical Exam Wound site is healed  and skin completely closed.  There is some slight erythema without tenderness.  Patient will be given a course of oral doxycycline. Otherwise breath sounds are clear and vital signs stable. Diagnostic Tests: None  Impression: Healed hematoma at old AICD generator site which was complicated with MSSA infection now resolved.  He will take a one-week course of oral doxycycline for some mild erythema.  Plan: Patient can treat the wound as normal skin now with bathing etc.  He will report any change especially any drainage from the wound and return as needed.   Len Childs, MD [facility] [phone]

## 2018-03-29 ENCOUNTER — Ambulatory Visit: Payer: Medicare Other | Admitting: Cardiology

## 2018-03-30 ENCOUNTER — Encounter: Payer: Self-pay | Admitting: *Deleted

## 2018-04-02 ENCOUNTER — Inpatient Hospital Stay (HOSPITAL_COMMUNITY)
Admission: EM | Admit: 2018-04-02 | Discharge: 2018-04-13 | DRG: 264 | Disposition: A | Discharge status: Other Acute Inpatient Hospital | Payer: Medicare Other | Attending: Internal Medicine | Admitting: Internal Medicine

## 2018-04-02 ENCOUNTER — Other Ambulatory Visit: Payer: Self-pay

## 2018-04-02 ENCOUNTER — Emergency Department (HOSPITAL_COMMUNITY): Payer: Medicare Other

## 2018-04-02 ENCOUNTER — Encounter (HOSPITAL_COMMUNITY): Payer: Self-pay | Admitting: Pharmacy Technician

## 2018-04-02 DIAGNOSIS — Z794 Long term (current) use of insulin: Secondary | ICD-10-CM

## 2018-04-02 DIAGNOSIS — D638 Anemia in other chronic diseases classified elsewhere: Secondary | ICD-10-CM | POA: Diagnosis present

## 2018-04-02 DIAGNOSIS — M6008 Infective myositis, other site: Secondary | ICD-10-CM | POA: Diagnosis not present

## 2018-04-02 DIAGNOSIS — N179 Acute kidney failure, unspecified: Secondary | ICD-10-CM | POA: Diagnosis present

## 2018-04-02 DIAGNOSIS — E1122 Type 2 diabetes mellitus with diabetic chronic kidney disease: Secondary | ICD-10-CM | POA: Diagnosis present

## 2018-04-02 DIAGNOSIS — Z452 Encounter for adjustment and management of vascular access device: Secondary | ICD-10-CM | POA: Diagnosis not present

## 2018-04-02 DIAGNOSIS — Z7189 Other specified counseling: Secondary | ICD-10-CM | POA: Diagnosis not present

## 2018-04-02 DIAGNOSIS — J440 Chronic obstructive pulmonary disease with acute lower respiratory infection: Secondary | ICD-10-CM | POA: Diagnosis not present

## 2018-04-02 DIAGNOSIS — E785 Hyperlipidemia, unspecified: Secondary | ICD-10-CM | POA: Diagnosis present

## 2018-04-02 DIAGNOSIS — R700 Elevated erythrocyte sedimentation rate: Secondary | ICD-10-CM

## 2018-04-02 DIAGNOSIS — R0602 Shortness of breath: Secondary | ICD-10-CM

## 2018-04-02 DIAGNOSIS — J9610 Chronic respiratory failure, unspecified whether with hypoxia or hypercapnia: Secondary | ICD-10-CM | POA: Diagnosis not present

## 2018-04-02 DIAGNOSIS — I5023 Acute on chronic systolic (congestive) heart failure: Secondary | ICD-10-CM | POA: Diagnosis not present

## 2018-04-02 DIAGNOSIS — S0990XA Unspecified injury of head, initial encounter: Secondary | ICD-10-CM | POA: Diagnosis not present

## 2018-04-02 DIAGNOSIS — M25512 Pain in left shoulder: Secondary | ICD-10-CM | POA: Diagnosis not present

## 2018-04-02 DIAGNOSIS — A419 Sepsis, unspecified organism: Secondary | ICD-10-CM | POA: Diagnosis present

## 2018-04-02 DIAGNOSIS — J439 Emphysema, unspecified: Secondary | ICD-10-CM | POA: Diagnosis not present

## 2018-04-02 DIAGNOSIS — L02211 Cutaneous abscess of abdominal wall: Secondary | ICD-10-CM | POA: Diagnosis not present

## 2018-04-02 DIAGNOSIS — N183 Chronic kidney disease, stage 3 unspecified: Secondary | ICD-10-CM | POA: Diagnosis present

## 2018-04-02 DIAGNOSIS — D729 Disorder of white blood cells, unspecified: Secondary | ICD-10-CM

## 2018-04-02 DIAGNOSIS — R5381 Other malaise: Secondary | ICD-10-CM | POA: Diagnosis not present

## 2018-04-02 DIAGNOSIS — M542 Cervicalgia: Secondary | ICD-10-CM | POA: Diagnosis not present

## 2018-04-02 DIAGNOSIS — I130 Hypertensive heart and chronic kidney disease with heart failure and stage 1 through stage 4 chronic kidney disease, or unspecified chronic kidney disease: Secondary | ICD-10-CM | POA: Diagnosis not present

## 2018-04-02 DIAGNOSIS — M79602 Pain in left arm: Secondary | ICD-10-CM | POA: Diagnosis not present

## 2018-04-02 DIAGNOSIS — Z95828 Presence of other vascular implants and grafts: Secondary | ICD-10-CM

## 2018-04-02 DIAGNOSIS — R05 Cough: Secondary | ICD-10-CM | POA: Diagnosis not present

## 2018-04-02 DIAGNOSIS — K651 Peritoneal abscess: Secondary | ICD-10-CM | POA: Diagnosis not present

## 2018-04-02 DIAGNOSIS — I5043 Acute on chronic combined systolic (congestive) and diastolic (congestive) heart failure: Secondary | ICD-10-CM | POA: Diagnosis not present

## 2018-04-02 DIAGNOSIS — I482 Chronic atrial fibrillation, unspecified: Secondary | ICD-10-CM | POA: Diagnosis present

## 2018-04-02 DIAGNOSIS — I639 Cerebral infarction, unspecified: Secondary | ICD-10-CM | POA: Diagnosis present

## 2018-04-02 DIAGNOSIS — Z95810 Presence of automatic (implantable) cardiac defibrillator: Secondary | ICD-10-CM | POA: Diagnosis not present

## 2018-04-02 DIAGNOSIS — Z66 Do not resuscitate: Secondary | ICD-10-CM | POA: Diagnosis present

## 2018-04-02 DIAGNOSIS — R110 Nausea: Secondary | ICD-10-CM | POA: Diagnosis not present

## 2018-04-02 DIAGNOSIS — I129 Hypertensive chronic kidney disease with stage 1 through stage 4 chronic kidney disease, or unspecified chronic kidney disease: Secondary | ICD-10-CM | POA: Diagnosis not present

## 2018-04-02 DIAGNOSIS — D631 Anemia in chronic kidney disease: Secondary | ICD-10-CM | POA: Diagnosis not present

## 2018-04-02 DIAGNOSIS — K219 Gastro-esophageal reflux disease without esophagitis: Secondary | ICD-10-CM | POA: Diagnosis not present

## 2018-04-02 DIAGNOSIS — T827XXA Infection and inflammatory reaction due to other cardiac and vascular devices, implants and grafts, initial encounter: Secondary | ICD-10-CM | POA: Diagnosis not present

## 2018-04-02 DIAGNOSIS — J90 Pleural effusion, not elsewhere classified: Secondary | ICD-10-CM | POA: Diagnosis not present

## 2018-04-02 DIAGNOSIS — A4901 Methicillin susceptible Staphylococcus aureus infection, unspecified site: Secondary | ICD-10-CM | POA: Diagnosis not present

## 2018-04-02 DIAGNOSIS — Z951 Presence of aortocoronary bypass graft: Secondary | ICD-10-CM

## 2018-04-02 DIAGNOSIS — T8189XA Other complications of procedures, not elsewhere classified, initial encounter: Secondary | ICD-10-CM | POA: Diagnosis not present

## 2018-04-02 DIAGNOSIS — R7982 Elevated C-reactive protein (CRP): Secondary | ICD-10-CM | POA: Diagnosis present

## 2018-04-02 DIAGNOSIS — R011 Cardiac murmur, unspecified: Secondary | ICD-10-CM | POA: Diagnosis not present

## 2018-04-02 DIAGNOSIS — I120 Hypertensive chronic kidney disease with stage 5 chronic kidney disease or end stage renal disease: Secondary | ICD-10-CM | POA: Diagnosis not present

## 2018-04-02 DIAGNOSIS — R1900 Intra-abdominal and pelvic swelling, mass and lump, unspecified site: Secondary | ICD-10-CM | POA: Diagnosis not present

## 2018-04-02 DIAGNOSIS — S199XXA Unspecified injury of neck, initial encounter: Secondary | ICD-10-CM | POA: Diagnosis not present

## 2018-04-02 DIAGNOSIS — I5022 Chronic systolic (congestive) heart failure: Secondary | ICD-10-CM | POA: Diagnosis present

## 2018-04-02 DIAGNOSIS — E1165 Type 2 diabetes mellitus with hyperglycemia: Secondary | ICD-10-CM

## 2018-04-02 DIAGNOSIS — D649 Anemia, unspecified: Secondary | ICD-10-CM

## 2018-04-02 DIAGNOSIS — I10 Essential (primary) hypertension: Secondary | ICD-10-CM | POA: Diagnosis not present

## 2018-04-02 DIAGNOSIS — I440 Atrioventricular block, first degree: Secondary | ICD-10-CM | POA: Diagnosis present

## 2018-04-02 DIAGNOSIS — I110 Hypertensive heart disease with heart failure: Secondary | ICD-10-CM | POA: Diagnosis not present

## 2018-04-02 DIAGNOSIS — K029 Dental caries, unspecified: Secondary | ICD-10-CM | POA: Diagnosis not present

## 2018-04-02 DIAGNOSIS — R112 Nausea with vomiting, unspecified: Secondary | ICD-10-CM | POA: Diagnosis not present

## 2018-04-02 DIAGNOSIS — B9561 Methicillin susceptible Staphylococcus aureus infection as the cause of diseases classified elsewhere: Secondary | ICD-10-CM | POA: Diagnosis present

## 2018-04-02 DIAGNOSIS — Z978 Presence of other specified devices: Secondary | ICD-10-CM | POA: Diagnosis not present

## 2018-04-02 DIAGNOSIS — Z515 Encounter for palliative care: Secondary | ICD-10-CM | POA: Diagnosis not present

## 2018-04-02 DIAGNOSIS — K041 Necrosis of pulp: Secondary | ICD-10-CM | POA: Diagnosis not present

## 2018-04-02 DIAGNOSIS — E1151 Type 2 diabetes mellitus with diabetic peripheral angiopathy without gangrene: Secondary | ICD-10-CM | POA: Diagnosis not present

## 2018-04-02 DIAGNOSIS — S4992XA Unspecified injury of left shoulder and upper arm, initial encounter: Secondary | ICD-10-CM | POA: Diagnosis not present

## 2018-04-02 DIAGNOSIS — T8141XA Infection following a procedure, superficial incisional surgical site, initial encounter: Secondary | ICD-10-CM | POA: Diagnosis present

## 2018-04-02 DIAGNOSIS — J22 Unspecified acute lower respiratory infection: Secondary | ICD-10-CM | POA: Diagnosis not present

## 2018-04-02 DIAGNOSIS — E119 Type 2 diabetes mellitus without complications: Secondary | ICD-10-CM | POA: Diagnosis not present

## 2018-04-02 DIAGNOSIS — J449 Chronic obstructive pulmonary disease, unspecified: Secondary | ICD-10-CM | POA: Diagnosis present

## 2018-04-02 DIAGNOSIS — I2583 Coronary atherosclerosis due to lipid rich plaque: Secondary | ICD-10-CM | POA: Diagnosis not present

## 2018-04-02 DIAGNOSIS — Z888 Allergy status to other drugs, medicaments and biological substances status: Secondary | ICD-10-CM | POA: Diagnosis not present

## 2018-04-02 DIAGNOSIS — L02213 Cutaneous abscess of chest wall: Secondary | ICD-10-CM | POA: Diagnosis not present

## 2018-04-02 DIAGNOSIS — E43 Unspecified severe protein-calorie malnutrition: Secondary | ICD-10-CM | POA: Diagnosis not present

## 2018-04-02 DIAGNOSIS — R188 Other ascites: Secondary | ICD-10-CM | POA: Diagnosis present

## 2018-04-02 DIAGNOSIS — I2510 Atherosclerotic heart disease of native coronary artery without angina pectoris: Secondary | ICD-10-CM | POA: Diagnosis present

## 2018-04-02 DIAGNOSIS — Z9981 Dependence on supplemental oxygen: Secondary | ICD-10-CM | POA: Diagnosis not present

## 2018-04-02 DIAGNOSIS — Z01811 Encounter for preprocedural respiratory examination: Secondary | ICD-10-CM

## 2018-04-02 DIAGNOSIS — E1129 Type 2 diabetes mellitus with other diabetic kidney complication: Secondary | ICD-10-CM | POA: Diagnosis present

## 2018-04-02 DIAGNOSIS — I255 Ischemic cardiomyopathy: Secondary | ICD-10-CM | POA: Diagnosis present

## 2018-04-02 DIAGNOSIS — N186 End stage renal disease: Secondary | ICD-10-CM | POA: Diagnosis not present

## 2018-04-02 DIAGNOSIS — W19XXXA Unspecified fall, initial encounter: Secondary | ICD-10-CM | POA: Diagnosis present

## 2018-04-02 DIAGNOSIS — Z950 Presence of cardiac pacemaker: Secondary | ICD-10-CM | POA: Diagnosis not present

## 2018-04-02 LAB — CBC WITH DIFFERENTIAL/PLATELET
BASOS ABS: 0 10*3/uL (ref 0.0–0.1)
Basophils Relative: 0 %
EOS PCT: 1 %
Eosinophils Absolute: 0.1 10*3/uL (ref 0.0–0.7)
HCT: 32.4 % — ABNORMAL LOW (ref 39.0–52.0)
Hemoglobin: 10.1 g/dL — ABNORMAL LOW (ref 13.0–17.0)
LYMPHS ABS: 1.1 10*3/uL (ref 0.7–4.0)
LYMPHS PCT: 7 %
MCH: 25.3 pg — AB (ref 26.0–34.0)
MCHC: 31.2 g/dL (ref 30.0–36.0)
MCV: 81 fL (ref 78.0–100.0)
MONO ABS: 1.2 10*3/uL — AB (ref 0.1–1.0)
Monocytes Relative: 8 %
Neutro Abs: 12.9 10*3/uL — ABNORMAL HIGH (ref 1.7–7.7)
Neutrophils Relative %: 84 %
PLATELETS: 272 10*3/uL (ref 150–400)
RBC: 4 MIL/uL — ABNORMAL LOW (ref 4.22–5.81)
RDW: 14.9 % (ref 11.5–15.5)
WBC: 15.3 10*3/uL — ABNORMAL HIGH (ref 4.0–10.5)

## 2018-04-02 LAB — COMPREHENSIVE METABOLIC PANEL
ALT: 16 U/L — ABNORMAL LOW (ref 17–63)
AST: 23 U/L (ref 15–41)
Albumin: 3.1 g/dL — ABNORMAL LOW (ref 3.5–5.0)
Alkaline Phosphatase: 129 U/L — ABNORMAL HIGH (ref 38–126)
Anion gap: 13 (ref 5–15)
BUN: 22 mg/dL — ABNORMAL HIGH (ref 6–20)
CHLORIDE: 87 mmol/L — AB (ref 101–111)
CO2: 33 mmol/L — ABNORMAL HIGH (ref 22–32)
Calcium: 8.9 mg/dL (ref 8.9–10.3)
Creatinine, Ser: 1.16 mg/dL (ref 0.61–1.24)
GFR calc Af Amer: >60 mL/min (ref >60)
GFR, EST NON AFRICAN AMERICAN: 58 mL/min — AB (ref >60)
Glucose, Bld: 176 mg/dL — ABNORMAL HIGH (ref 65–99)
POTASSIUM: 4.1 mmol/L (ref 3.5–5.1)
Sodium: 133 mmol/L — ABNORMAL LOW (ref 135–145)
Total Bilirubin: 0.6 mg/dL (ref 0.3–1.2)
Total Protein: 7.7 g/dL (ref 6.5–8.1)

## 2018-04-02 LAB — C-REACTIVE PROTEIN: CRP: 23.4 mg/dL — ABNORMAL HIGH (ref <1.0)

## 2018-04-02 LAB — I-STAT CG4 LACTIC ACID, ED: LACTIC ACID, VENOUS: 1.84 mmol/L (ref 0.5–1.9)

## 2018-04-02 LAB — SEDIMENTATION RATE: SED RATE: 88 mm/h — AB (ref 0–16)

## 2018-04-02 MED ORDER — BISOPROLOL FUMARATE 5 MG PO TABS
2.5000 mg | ORAL_TABLET | Freq: Every day | ORAL | Status: DC
  Administered 2018-04-03 – 2018-04-13 (×11): 2.5 mg via ORAL
  Filled 2018-04-02 (×12): qty 1

## 2018-04-02 MED ORDER — POLYETHYLENE GLYCOL 3350 17 G PO PACK
17.0000 g | PACK | Freq: Every day | ORAL | Status: DC
  Administered 2018-04-03 – 2018-04-09 (×7): 17 g via ORAL
  Filled 2018-04-02 (×7): qty 1

## 2018-04-02 MED ORDER — CEFAZOLIN SODIUM-DEXTROSE 1-4 GM/50ML-% IV SOLN
1.0000 g | Freq: Once | INTRAVENOUS | Status: AC
  Administered 2018-04-02: 1 g via INTRAVENOUS
  Filled 2018-04-02: qty 50

## 2018-04-02 MED ORDER — DOCUSATE SODIUM 100 MG PO CAPS
100.0000 mg | ORAL_CAPSULE | Freq: Every day | ORAL | Status: DC
  Administered 2018-04-03 – 2018-04-12 (×11): 100 mg via ORAL
  Filled 2018-04-02 (×11): qty 1

## 2018-04-02 MED ORDER — VANCOMYCIN HCL 10 G IV SOLR
1250.0000 mg | Freq: Once | INTRAVENOUS | Status: DC
  Filled 2018-04-02: qty 1250

## 2018-04-02 MED ORDER — VANCOMYCIN HCL IN DEXTROSE 1-5 GM/200ML-% IV SOLN: 1000.0000 mg | Freq: Once | INTRAVENOUS | Status: DC

## 2018-04-02 MED ORDER — VITAMIN B-12 100 MCG PO TABS
100.0000 ug | ORAL_TABLET | Freq: Every day | ORAL | Status: DC
  Administered 2018-04-03 – 2018-04-13 (×10): 100 ug via ORAL
  Filled 2018-04-02 (×11): qty 1

## 2018-04-02 MED ORDER — NITROGLYCERIN 0.4 MG SL SUBL
0.4000 mg | SUBLINGUAL_TABLET | SUBLINGUAL | Status: DC | PRN
  Filled 2018-04-02: qty 1

## 2018-04-02 MED ORDER — HYDROCODONE-ACETAMINOPHEN 5-325 MG PO TABS: 1.0000 | ORAL_TABLET | Freq: Four times a day (QID) | ORAL | Status: DC | PRN

## 2018-04-02 MED ORDER — METHOCARBAMOL 500 MG PO TABS
750.0000 mg | ORAL_TABLET | Freq: Three times a day (TID) | ORAL | Status: DC | PRN
  Administered 2018-04-07 – 2018-04-13 (×7): 750 mg via ORAL
  Filled 2018-04-02 (×7): qty 2

## 2018-04-02 MED ORDER — MORPHINE SULFATE (PF) 4 MG/ML IV SOLN
4.0000 mg | Freq: Once | INTRAVENOUS | Status: AC
  Administered 2018-04-02: 4 mg via INTRAVENOUS
  Filled 2018-04-02: qty 1

## 2018-04-02 MED ORDER — PRAVASTATIN SODIUM 40 MG PO TABS
40.0000 mg | ORAL_TABLET | Freq: Every day | ORAL | Status: DC
  Administered 2018-04-03 – 2018-04-12 (×10): 40 mg via ORAL
  Filled 2018-04-02 (×10): qty 1

## 2018-04-02 MED ORDER — ONDANSETRON HCL 4 MG/2ML IJ SOLN
4.0000 mg | Freq: Three times a day (TID) | INTRAMUSCULAR | Status: DC | PRN
  Administered 2018-04-03 – 2018-04-09 (×6): 4 mg via INTRAVENOUS
  Filled 2018-04-02 (×6): qty 2

## 2018-04-02 MED ORDER — PANTOPRAZOLE SODIUM 40 MG PO TBEC
40.0000 mg | DELAYED_RELEASE_TABLET | Freq: Every day | ORAL | Status: DC
  Administered 2018-04-03 – 2018-04-13 (×11): 40 mg via ORAL
  Filled 2018-04-02 (×11): qty 1

## 2018-04-02 MED ORDER — HYDRALAZINE HCL 20 MG/ML IJ SOLN: 5.0000 mg | INTRAMUSCULAR | Status: DC | PRN

## 2018-04-02 MED ORDER — FUROSEMIDE 40 MG PO TABS
40.0000 mg | ORAL_TABLET | Freq: Two times a day (BID) | ORAL | Status: DC
  Administered 2018-04-03: 40 mg via ORAL
  Filled 2018-04-02: qty 1

## 2018-04-02 MED ORDER — DULOXETINE HCL 30 MG PO CPEP
30.0000 mg | ORAL_CAPSULE | Freq: Every day | ORAL | Status: DC
  Administered 2018-04-03 – 2018-04-13 (×11): 30 mg via ORAL
  Filled 2018-04-02 (×11): qty 1

## 2018-04-02 MED ORDER — ONDANSETRON HCL 4 MG/2ML IJ SOLN
4.0000 mg | Freq: Once | INTRAMUSCULAR | Status: AC
  Administered 2018-04-02: 4 mg via INTRAVENOUS
  Filled 2018-04-02: qty 2

## 2018-04-02 MED ORDER — INSULIN DETEMIR 100 UNIT/ML ~~LOC~~ SOLN
7.0000 [IU] | SUBCUTANEOUS | Status: DC
  Administered 2018-04-03 – 2018-04-11 (×7): 7 [IU] via SUBCUTANEOUS
  Filled 2018-04-02 (×12): qty 0.07

## 2018-04-02 MED ORDER — ZOLPIDEM TARTRATE 5 MG PO TABS
5.0000 mg | ORAL_TABLET | Freq: Every evening | ORAL | Status: DC | PRN
  Administered 2018-04-05 – 2018-04-06 (×2): 5 mg via ORAL
  Filled 2018-04-02 (×2): qty 1

## 2018-04-02 MED ORDER — VANCOMYCIN HCL 10 G IV SOLR
1250.0000 mg | INTRAVENOUS | Status: DC
  Administered 2018-04-02: 1250 mg via INTRAVENOUS
  Filled 2018-04-02: qty 1250

## 2018-04-02 MED ORDER — INSULIN ASPART 100 UNIT/ML ~~LOC~~ SOLN
0.0000 [IU] | Freq: Three times a day (TID) | SUBCUTANEOUS | Status: DC
  Administered 2018-04-03: 1 [IU] via SUBCUTANEOUS
  Administered 2018-04-03 – 2018-04-04 (×2): 2 [IU] via SUBCUTANEOUS
  Administered 2018-04-04 (×2): 1 [IU] via SUBCUTANEOUS
  Administered 2018-04-05 (×2): 2 [IU] via SUBCUTANEOUS
  Administered 2018-04-06 – 2018-04-07 (×3): 1 [IU] via SUBCUTANEOUS
  Administered 2018-04-08 (×2): 2 [IU] via SUBCUTANEOUS
  Administered 2018-04-08: 3 [IU] via SUBCUTANEOUS
  Administered 2018-04-09: 2 [IU] via SUBCUTANEOUS
  Administered 2018-04-09: 1 [IU] via SUBCUTANEOUS
  Administered 2018-04-10 – 2018-04-13 (×6): 2 [IU] via SUBCUTANEOUS

## 2018-04-02 MED ORDER — SENNOSIDES-DOCUSATE SODIUM 8.6-50 MG PO TABS
1.0000 | ORAL_TABLET | Freq: Two times a day (BID) | ORAL | Status: DC
  Administered 2018-04-03 – 2018-04-11 (×17): 1 via ORAL
  Filled 2018-04-02 (×18): qty 1

## 2018-04-02 MED ORDER — AMIODARONE HCL 200 MG PO TABS
200.0000 mg | ORAL_TABLET | Freq: Every day | ORAL | Status: DC
  Administered 2018-04-03 – 2018-04-13 (×11): 200 mg via ORAL
  Filled 2018-04-02 (×11): qty 1

## 2018-04-02 MED ORDER — CALCIUM CARBONATE-VITAMIN D 500-200 MG-UNIT PO TABS
1.0000 | ORAL_TABLET | Freq: Every day | ORAL | Status: DC
  Administered 2018-04-03 – 2018-04-12 (×10): 1 via ORAL
  Filled 2018-04-02 (×12): qty 1

## 2018-04-02 MED ORDER — FERROUS SULFATE 325 (65 FE) MG PO TABS
325.0000 mg | ORAL_TABLET | Freq: Two times a day (BID) | ORAL | Status: DC
  Administered 2018-04-03 – 2018-04-13 (×20): 325 mg via ORAL
  Filled 2018-04-02 (×20): qty 1

## 2018-04-02 MED ORDER — IOHEXOL 300 MG/ML  SOLN
100.0000 mL | Freq: Once | INTRAMUSCULAR | Status: AC | PRN
  Administered 2018-04-02: 100 mL via INTRAVENOUS

## 2018-04-02 MED ORDER — ACETAMINOPHEN 325 MG PO TABS: 650.0000 mg | ORAL_TABLET | Freq: Four times a day (QID) | ORAL | Status: DC | PRN

## 2018-04-02 NOTE — ED Provider Notes (Signed)
[facility] EMERGENCY DEPARTMENT Provider Note   CSN: [id number] Arrival date & time: 04/02/18  1820     History   Chief Complaint No chief complaint on file.   HPI Victor [name] is a 82 y.o. male with a PMHx of anemia, Afib on eliquis, ischemic cardiomyopathy s/p AICD placement, CKD3, COPD on 3L O2 via [place], CAD s/p CABG, HTN, HLD, PVD, DM2, thrombocytopenia, and other conditions listed below, who presents to the ED with complaints of erythema, warmth, swelling, and drainage from L abdominal prior surgical site that started 2 days ago, as well as erythema, warmth, swelling, and pain to his sternal area x5 days.  Per chart review, pt had an infected LUQ rectus sheath hematoma at site of prior AICD pocket for which he underwent I&D in 12/2017, wound vac and antibiotics were used to heal this site and it subsequently healed well; he was seen by Dr. Prescott Gum on 03/28/18 for his final post-op visit and his notes state that there was some mild erythema (presumably in the sternal area, per pt's recounting) so he was started on 1wk course of doxycycline '100mg'$  BID.  He states he's been compliant with the antibiotics but has noticed that the sternal area has worsened and gotten more swollen, and then 2 days ago the abdominal area wound started draining.  No known aggravating factors, and no other tx tried PTA aside from the antibiotics and his usual home medications.  He's also been having nausea with dry heaves but no vomiting, as well as lack of appetite.  Lastly he mentions having a fall about 3 weeks ago where he missed a step and fell down onto his left elbow and left head/face, and now complains of bilateral forearm and shoulder pain for the last several days.  He denies any red streaking from the wounds, fevers, chills, CP, SOB, abdominal pain, vomiting, diarrhea, constipation, dysuria, hematuria, numbness, tingling, focal weakness, or any other complaints at this time.  His PCP is at  [facility] in [place].  He has been n.p.o. since 10 AM.  The history is provided by the patient and medical records. No language interpreter was used.    Past Medical History:  Diagnosis Date  . Anemia   . Arthritis   . Atrial fibrillation, persistent ([place]) 12/15/2017  . Cardiomyopathy, ischemic    a. s/p ICD placement  . Chronic combined systolic and diastolic CHF (congestive heart failure) ([place])   . CKD (chronic kidney disease), stage III ([place])   . COPD (chronic obstructive pulmonary disease) ([place])   . Coronary artery disease    a. s/p CABG 1990. b. multiple PCIs since that time including CTO angioplasty (without stenting) PCI of RCA 09/2014, with last cath 02/2015 done for worsening CHF with unsuccessful attempt at PCI of the RCA due to inability to cross occlusion - medical therapy advised at that time.  . Debilitated 12/15/2017  . GERD (gastroesophageal reflux disease)   . HLD (hyperlipidemia)   . Hypertension   . Occlusion and stenosis of carotid artery without mention of cerebral infarction   . On home oxygen therapy    "2L; 24/7" (11/08/2017)  . Orthostatic hypotension   . Paroxysmal ventricular tachycardia ([place])   . Peripheral vascular disease ([place])   . Raynaud's syndrome   . Stroke [facility])    a. CT head 06/2015: Small old lacunar infarct LEFT basal ganglia.  . Thrombocytopenia ([place])   . TIA (transient ischemic attack)    a.  s/p LHC on 10/01/14   . Type II diabetes mellitus [facility])     Patient Active Problem List   Diagnosis Date Noted  . Normocytic anemia 01/01/2018  . Infected defibrillator ([place])   . MSSA (methicillin susceptible Staphylococcus aureus) infection   . Abdominal wall abscess 12/26/2017  . Atrial fibrillation, persistent ([place]) 12/15/2017  . DNR (do not resuscitate) 12/15/2017  . Debilitated 12/15/2017  . COPD (chronic obstructive pulmonary disease) ([place]) 12/15/2017  . Leukocytosis 12/06/2017  . Abdominal wall fluid collections 12/04/2017  . NSTEMI  (non-ST elevated myocardial infarction) ([place]) 11/08/2017  . Elevated troponin 11/08/2017  . Demand ischemia ([place])   . Acute on chronic combined systolic and diastolic CHF (congestive heart failure) ([place])   . Chest pain   . AKI (acute kidney injury) ([place]) 07/16/2017  . Hyponatremia 07/16/2017  . Stage 3 chronic kidney disease ([place]) 10/27/2016  . Orthostatic hypotension 09/16/2016  . Emphysema of lung ([place]) 12/23/2015  . OSA (obstructive sleep apnea)   . Near syncope 06/28/2015  . GERD (gastroesophageal reflux disease)   . Hx of CABG 1990   . Essential hypertension 10/02/2014  . TIA (transient ischemic attack) 10/02/2014  . Microcytic anemia 04/25/2013  . Acute on chronic systolic CHF (congestive heart failure) ([place]) 04/24/2013  . Insulin dependent type 2 diabetes mellitus, controlled ([place]) 07/25/2009  . Cardiomyopathy, ischemic 07/25/2009  . VENTRICULAR TACHYCARDIA 07/25/2009  . Peripheral vascular disease ([place]) 07/25/2009  . Osteoarthritis 07/25/2009  . Arthropathy 07/25/2009  . BACK PAIN 07/25/2009  . Automatic implantable cardioverter-defibrillator in situ 07/25/2009    Past Surgical History:  Procedure Laterality Date  . ANAL FISTULECTOMY  2000's  . APPLICATION OF WOUND VAC Left 12/29/2017   Procedure: APPLICATION OF WOUND VAC;  Surgeon: Ivin Poot, MD;  Location: [facility];  Service: Vascular;  Laterality: Left;  . CARDIAC CATHETERIZATION  10/01/2014   PTVA to CTO with restoration of TIMI 3  . CATARACT EXTRACTION, BILATERAL Bilateral ~ 2009  . CHEST EXPLORATION N/A 12/11/2017   Procedure: IRRIGATION AND DEBRIDEMENT OF CHEST HEMATOMA;  Surgeon: Ivin Poot, MD;  Location: [facility];  Service: Thoracic;  Laterality: N/A;  . CORONARY ARTERY BYPASS GRAFT  01/1989   "CABG X3"   . FINGER FRACTURE SURGERY Left ~ 2000   "pointer"  . I&D EXTREMITY Left 12/29/2017   Procedure: IRRIGATION AND DEBRIDEMENT ABDOMINAL WALL ABSCESS;  Surgeon: Ivin Poot, MD;  Location: [facility];   Service: Vascular;  Laterality: Left;  . IMPLANTABLE CARDIOVERTER DEFIBRILLATOR (ICD) GENERATOR CHANGE Left 10/05/2011   Procedure: ICD GENERATOR CHANGE;  Surgeon: Evans Lance, MD;  Location: [facility] CATH LAB;  Service: Cardiovascular;  Laterality: Left;  . LEFT AND RIGHT HEART CATHETERIZATION WITH CORONARY/GRAFT ANGIOGRAM N/A 04/26/2013   Procedure: LEFT AND RIGHT HEART CATHETERIZATION WITH Beatrix Fetters;  Surgeon: Sherren Mocha, MD;  Location[facility] C[facility] LAB;  Service: Cardiovascular;  Laterality: N/A;  . LEFT HEART CATHETERIZATION WITH CORONARY ANGIOGRAM N/A 08/01/2014   Procedure: LEFT HEART CATHETERIZATION WITH CORONARY ANGIOGRAM;  Surgeon: Sinclair Grooms, MD;  Location: [facility] CATH LAB;  Service: Cardiovascular;  Laterality: N/A;  . LEFT HEART CATHETERIZATION WITH CORONARY ANGIOGRAM N/A 03/20/2015   Procedure: LEFT HEART CATHETERIZATION WITH CORONARY ANGIOGRAM;  Surgeon: Peter M Martinique, MD;  Location: [facility] CATH LAB;  Service: Cardiovascular;  Laterality: N/A;  . PERCUTANEOUS STENT INTERVENTION  04/26/2013   Procedure: PERCUTANEOUS STENT INTERVENTION;  Surgeon: Sherren Mocha, MD;  Location: [facility] CATH LAB;  Service: Cardiovascular;;  . RIGHT HEART  CATHETERIZATION  10/01/2014   Procedure: RIGHT HEART CATH;  Surgeon: Peter M Martinique, MD;  Location: [facility] CATH LAB;  Service: Cardiovascular;;  . TEE WITHOUT CARDIOVERSION N/A 01/03/2018   Procedure: TRANSESOPHAGEAL ECHOCARDIOGRAM (TEE);  Surgeon: Sueanne Margarita, MD;  Location: [facility] ENDOSCOPY;  Service: Cardiovascular;  Laterality: N/A;        Home Medications    Prior to Admission medications   Medication Sig Start Date End Date Taking? Authorizing Provider  acetaminophen (TYLENOL) 325 MG tablet Take 2 tablets (650 mg total) by mouth every 4 (four) hours as needed for headache or mild pain. 12/15/17   Isaiah Serge, NP  amiodarone (PACERONE) 200 MG tablet Take 1 tablet (200 mg total) by mouth daily. 12/15/17   Isaiah Serge, NP  apixaban (ELIQUIS)  5 MG TABS tablet Take 1 tablet (5 mg total) by mouth 2 (two) times daily. 02/28/18   Martinique, Peter M, MD  bisoprolol (ZEBETA) 5 MG tablet Take 0.5 tablets (2.5 mg total) by mouth daily. 02/28/18   Martinique, Peter M, MD  CALCIUM PO Take 1 tablet by mouth at bedtime.    [provider]  Cyanocobalamin (VITAMIN B-12 PO) Take 1 tablet by mouth daily.     [provider]  docusate sodium (COLACE) 100 MG capsule Take 1 capsule (100 mg total) by mouth at bedtime. 11/09/17   Regalado, Belkys A, MD  doxycycline (VIBRA-TABS) 100 MG tablet Take 1 tablet (100 mg total) by mouth 2 (two) times daily. 03/28/18   Ivin Poot, MD  DULoxetine (CYMBALTA) 30 MG capsule Take 30 mg by mouth daily.  10/13/17   [provider]  ferrous sulfate 325 (65 FE) MG tablet Take 1 tablet (325 mg total) by mouth 2 (two) times daily with a meal. 01/10/18   Alma Friendly, MD  furosemide (LASIX) 40 MG tablet Take 1 tablet (40 mg total) by mouth 2 (two) times daily. 12/15/17 01/14/18  Isaiah Serge, NP  HYDROcodone-acetaminophen (NORCO/VICODIN) 5-325 MG tablet Take 1 tablet by mouth every 6 (six) hours as needed for moderate pain or severe pain. 01/10/18   Alma Friendly, MD  insulin detemir (LEVEMIR) 100 UNIT/ML injection Inject 10 Units into the skin See admin instructions. 10 units in the morning. May increase as needed to keep fasting BS < 130.    [provider]  lidocaine (LIDODERM) 5 % Place 2 patches onto the skin daily. Remove & Discard patch within 12 hours or as directed by MD 12/16/17   Isaiah Serge, NP  linagliptin (TRADJENTA) 5 MG TABS tablet Take 1 tablet (5 mg total) by mouth daily. 12/16/17   Isaiah Serge, NP  methocarbamol (ROBAXIN) 750 MG tablet Take 1 tablet (750 mg total) by mouth every 8 (eight) hours as needed for muscle spasms. 12/15/17   Isaiah Serge, NP  nitroGLYCERIN (NITROSTAT) 0.4 MG SL tablet Place 1 tablet (0.4 mg total) under the tongue every 5 (five)  minutes as needed for chest pain. 03/15/18   [place]ique, Peter M, MD  omeprazole (PRILOSEC) 20 MG capsule Take 20 mg by mouth every morning. 11/29/17   [provider]  polyethylene glycol (MIRALAX / GLYCOLAX) packet Take 17 g by mouth daily. 01/11/18   Alma Friendly, MD  pravastatin (PRAVACHOL) 40 MG tablet Take 1 tablet (40 mg total) by mouth daily at 6 PM. 11/09/17   Regalado, Belkys A, MD  senna-docusate (SENOKOT-S) 8.6-50 MG tablet Take 1 tablet by mouth 2 (two)  times daily. 01/10/18   Alma Friendly, MD  traMADol (ULTRAM) 50 MG tablet Take 50 mg by mouth 3 (three) times daily as needed for pain. 10/16/17   [provider]    Family History Family History  Problem Relation Age of Onset  . Heart failure Mother 4  . Heart attack Father   . Leukemia Sister   . Stomach cancer Maternal Grandfather     Social History Social History   Tobacco Use  . Smoking status: Former Smoker    Years: 3.00    Types: Pipe  . Smokeless tobacco: Never Used  . Tobacco comment: "quit smoking pipe in early 1970's"  Substance Use Topics  . Alcohol use: Yes    Comment: 11/08/2017 "wine w/dinner maybe once/yr; or less"  . Drug use: No     Allergies   Coreg [carvedilol]; Lovastatin; and Ativan [lorazepam]   Review of Systems Review of Systems  Constitutional: Positive for appetite change. Negative for chills and fever.  Respiratory: Negative for shortness of breath.   Cardiovascular: Negative for chest pain.  Gastrointestinal: Positive for nausea. Negative for abdominal pain, constipation, diarrhea and vomiting.  Genitourinary: Negative for dysuria and hematuria.  Musculoskeletal: Positive for myalgias (sternal area and b/l arms). Negative for arthralgias.  Skin: Positive for color change and wound.  Allergic/Immunologic: Positive for immunocompromised state (DM2).  Neurological: Negative for weakness and numbness.  Hematological: Bruises/bleeds easily (on eliquis).    All other systems reviewed and are negative for acute change except as noted in the HPI.    Physical Exam Updated Vital Signs BP (!) 158/71   Pulse 69   Temp 98 F (36.7 C) (Oral)   Resp 20   Ht '5\' 7"'$  (1.702 m)   Wt 65.8 kg (145 lb)   SpO2 100%   BMI 22.71 kg/m   Physical Exam  Constitutional: He is oriented to person, place, and time. Vital signs are normal. He appears well-developed and well-nourished.  Non-toxic appearance. No distress.  Afebrile, nontoxic, NAD  HENT:  Head: Normocephalic and atraumatic.  Mouth/Throat: Oropharynx is clear and moist and mucous membranes are normal.  Eyes: Conjunctivae and EOM are normal. Right eye exhibits no discharge. Left eye exhibits no discharge.  Neck: Normal range of motion. Neck supple.  Cardiovascular: Normal rate, regular rhythm, normal heart sounds and intact distal pulses. Exam reveals no gallop and no friction rub.  No murmur heard. RRR, nl s1/s2, no m/r/g, distal pulses intact, no appreciable pedal edema  Pulmonary/Chest: Effort normal and breath sounds normal. No respiratory distress. He has no decreased breath sounds. He has no wheezes. He has no rhonchi. He has no rales. He exhibits tenderness and swelling. He exhibits no crepitus, no deformity and no retraction.  CTAB in all lung fields, no w/r/r, no hypoxia or increased WOB, speaking in full sentences, SpO2 100% on 3L via [place] Healed midline sternotomy scar with ~5cm area of erythema (measuring superior to inferior) and central fluctuance ~2cm in diameter over lower portion of the sternum with warmth and TTP overlying the area as well as into the epigastric area. No red streaking away from this area. AICD in place in L upper anterior chest, no overlying erythema in this area. No retraction, deformity, or crepitus of the chest wall. SEE PICTURE BELOW     Abdominal: Soft. Normal appearance and bowel sounds are normal. He exhibits no distension. There is tenderness in the epigastric  area, left upper quadrant and left lower quadrant. There is  no rigidity, no rebound, no guarding, no CVA tenderness, no tenderness at McBurney's point and negative Murphy's sign.    Soft, nondistended, +BS throughout, with moderate sized area of induration and TTP to L lateral abdomen overlying prior surgical site which is erythematous and warm to touch, purulent drainage coming from the wound but no pocket of fluctuance appreciated. Mild epigastric TTP. No r/g/r, neg murphy's, neg mcburney's, no CVA TTP  SEE PICTURE BELOW  Musculoskeletal: Normal range of motion.  MAE x4 Strength and sensation grossly intact in all extremities Distal pulses intact No focal areas of tenderness to either shoulder or upper extremity. No swelling or effusion, no joint line TTP in all major joints. FROM intact in all joints.   Neurological: He is alert and oriented to person, place, and time. He has normal strength. No sensory deficit.  Skin: Skin is warm, dry and intact. No rash noted. There is erythema.  Erythema and warmth to sternum and L abdomen as well as wound to L abdomen, all as described above and pictured below  Psychiatric: He has a normal mood and affect.  Nursing note and vitals reviewed.      ED Treatments / Results  Labs (all labs ordered are listed, but only abnormal results are displayed) Labs Reviewed  CBC WITH DIFFERENTIAL/PLATELET - Abnormal; Notable for the following components:      Result Value   WBC 15.3 (*)    RBC 4.00 (*)    Hemoglobin 10.1 (*)    HCT 32.4 (*)    MCH 25.3 (*)    Neutro Abs 12.9 (*)    Monocytes Absolute 1.2 (*)    All other components within normal limits  COMPREHENSIVE METABOLIC PANEL - Abnormal; Notable for the following components:   Sodium 133 (*)    Chloride 87 (*)    CO2 33 (*)    Glucose, Bld 176 (*)    BUN 22 (*)    Albumin 3.1 (*)    ALT 16 (*)    Alkaline Phosphatase 129 (*)    GFR calc non Af Amer 58 (*)    All other components within  normal limits  SEDIMENTATION RATE - Abnormal; Notable for the following components:   Sed Rate 88 (*)    All other components within normal limits  C-REACTIVE PROTEIN - Abnormal; Notable for the following components:   CRP 23.4 (*)    All other components within normal limits  CULTURE, BLOOD (ROUTINE X 2)  CULTURE, BLOOD (ROUTINE X 2)  URINALYSIS, ROUTINE W REFLEX MICROSCOPIC  I-STAT CG4 LACTIC ACID, ED    EKG EKG Interpretation  Date/Time:  Monday Apr 02 2018 18:30:26 EDT Ventricular Rate:  71 PR Interval:    QRS Duration: 124 QT Interval:  455 QTC Calculation: 495 R Axis:   133 Text Interpretation:  Right and left arm electrode reversal, interpretation assumes no reversal Sinus or ectopic atrial rhythm Prolonged PR interval Nonspecific intraventricular conduction delay Nonspecific repol abnormality, diffuse leads Confirmed by Dene Gentry [id number]) on 04/02/2018 6:33:52 PM   Radiology Ct Chest W Contrast  Result Date: 04/02/2018 CLINICAL DATA:  Recent surgery to remove infected wires from previous AICD. Also history of anal fistula repair. EXAM: CT CHEST, ABDOMEN, AND PELVIS WITH CONTRAST TECHNIQUE: Multidetector CT imaging of the chest, abdomen and pelvis was performed following the standard protocol during bolus administration of intravenous contrast. CONTRAST:  143m OMNIPAQUE IOHEXOL 300 MG/ML  SOLN COMPARISON:  CT abdomen 12/26/2017.  CT chest 12/02/2017. FINDINGS: CT  CHEST FINDINGS Cardiovascular: Aortic atherosclerosis. Previous median sternotomy and CABG. Coronary artery calcification. Cardiomegaly. Pacemaker/AICD in place. Pericardial device on the left. Pericardial device anteriorly. Mediastinum/Nodes: Slight enlargement of AP window nodes compared to the previous exam, presumably reactive. Lungs/Pleura: Small effusions layering dependently. Dependent pulmonary atelectasis and or pneumonia, most pronounced in the left lower lobe. Non dependent lungs are clear. Musculoskeletal:  Small fluid density area anterior to the xiphoid process measures about 2.5 cm in diameter and could possibly represent a superficial abscess. CT ABDOMEN PELVIS FINDINGS Hepatobiliary: Liver parenchyma appears normal. Calcified gallstones as seen previously. Pancreas: Atrophic change.  No focal finding. Spleen: Normal Adrenals/Urinary Tract: Adrenal glands are normal. Kidneys are normal except for a few benign cysts. Bladder is normal. Stomach/Bowel: No acute bowel finding. Vascular/Lymphatic: Aortic atherosclerosis. Branch vessel atherosclerosis. No aneurysm. No retroperitoneal adenopathy. Reproductive: Normal Other: No free fluid or air. Musculoskeletal: Soft tissue swelling of the left anterior abdominal wall region including the rectus muscle and overlying sheath, surrounding leads, some of which have been removed since the study of February. The area of concern measures up to 2.8 cm in thickness and 8.4 cm right to left and extends over a length of about 8.3 cm. This may simply be postoperative sequela, but ongoing infection is not excluded. Ordinary degenerative changes affect the spine. IMPRESSION: Bilateral pleural effusions layering dependently with dependent atelectasis and or pneumonia, left lower lobe predominant. Enlargement of AP window lymph nodes, presumably reactive. Fluid collection anterior to the xiphoid process measuring up to 2.5 cm in diameter. This could be infected material. Fluid at the left anterior abdominal wall including around the previous anterior pericardial/epicardial leads, some of which have been removed. This area measures approximately 2.8 x 8.4 x 8.3 cm. This could simply be postoperative swelling and edema, but the possibility of ongoing or recurrent infection is not excluded. The appearance is better than on the study of 12/26/2017. Electronically Signed   By: Nelson Chimes M.D.   On: 04/02/2018 22:03   Ct Abdomen Pelvis W Contrast  Result Date: 04/02/2018 CLINICAL DATA:   Recent surgery to remove infected wires from previous AICD. Also history of anal fistula repair. EXAM: CT CHEST, ABDOMEN, AND PELVIS WITH CONTRAST TECHNIQUE: Multidetector CT imaging of the chest, abdomen and pelvis was performed following the standard protocol during bolus administration of intravenous contrast. CONTRAST:  156m OMNIPAQUE IOHEXOL 300 MG/ML  SOLN COMPARISON:  CT abdomen 12/26/2017.  CT chest 12/02/2017. FINDINGS: CT CHEST FINDINGS Cardiovascular: Aortic atherosclerosis. Previous median sternotomy and CABG. Coronary artery calcification. Cardiomegaly. Pacemaker/AICD in place. Pericardial device on the left. Pericardial device anteriorly. Mediastinum/Nodes: Slight enlargement of AP window nodes compared to the previous exam, presumably reactive. Lungs/Pleura: Small effusions layering dependently. Dependent pulmonary atelectasis and or pneumonia, most pronounced in the left lower lobe. Non dependent lungs are clear. Musculoskeletal: Small fluid density area anterior to the xiphoid process measures about 2.5 cm in diameter and could possibly represent a superficial abscess. CT ABDOMEN PELVIS FINDINGS Hepatobiliary: Liver parenchyma appears normal. Calcified gallstones as seen previously. Pancreas: Atrophic change.  No focal finding. Spleen: Normal Adrenals/Urinary Tract: Adrenal glands are normal. Kidneys are normal except for a few benign cysts. Bladder is normal. Stomach/Bowel: No acute bowel finding. Vascular/Lymphatic: Aortic atherosclerosis. Branch vessel atherosclerosis. No aneurysm. No retroperitoneal adenopathy. Reproductive: Normal Other: No free fluid or air. Musculoskeletal: Soft tissue swelling of the left anterior abdominal wall region including the rectus muscle and overlying sheath, surrounding leads, some of which have been removed since the  study of February. The area of concern measures up to 2.8 cm in thickness and 8.4 cm right to left and extends over a length of about 8.3 cm. This  may simply be postoperative sequela, but ongoing infection is not excluded. Ordinary degenerative changes affect the spine. IMPRESSION: Bilateral pleural effusions layering dependently with dependent atelectasis and or pneumonia, left lower lobe predominant. Enlargement of AP window lymph nodes, presumably reactive. Fluid collection anterior to the xiphoid process measuring up to 2.5 cm in diameter. This could be infected material. Fluid at the left anterior abdominal wall including around the previous anterior pericardial/epicardial leads, some of which have been removed. This area measures approximately 2.8 x 8.4 x 8.3 cm. This could simply be postoperative swelling and edema, but the possibility of ongoing or recurrent infection is not excluded. The appearance is better than on the study of 12/26/2017. Electronically Signed   By: Nelson Chimes M.D.   On: 04/02/2018 22:03    Procedures Procedures (including critical care time)  Medications Ordered in ED Medications  ceFAZolin (ANCEF) IVPB 1 g/50 mL premix (has no administration in time range)  vancomycin (VANCOCIN) 1,250 mg in sodium chloride 0.9 % 250 mL IVPB (has no administration in time range)  morphine 4 MG/ML injection 4 mg (4 mg Intravenous Given 04/02/18 1932)  ondansetron (ZOFRAN) injection 4 mg (4 mg Intravenous Given 04/02/18 1931)  iohexol (OMNIPAQUE) 300 MG/ML solution 100 mL (100 mLs Intravenous Contrast Given 04/02/18 2135)     Initial Impression / Assessment and Plan / ED Course  I have reviewed the triage vital signs and the nursing notes.  Pertinent labs & imaging results that were available during my care of the patient were reviewed by me and considered in my medical decision making (see chart for details).     82 y.o. male here with 2 days of erythema and drainage to L abdomen prior surgical site and 5 days of erythema and pain from sternal area overlying prior surgical site. He had I&D of L abdomen rectus sheath hematoma  and prior AICD location in 12/2017, was on abx and had wound vac, eventually the area healed; he was seen by his surgeon Dr. Prescott Gum on 03/28/18 and was given doxycycline for a small area of erythema on the chest wall, which he's been compliant with, but states that the area has worsened.  On exam, ~5cm area of erythema and central fluctuance over sternum with warmth and TTP overlying the area as well as into the epigastric area; L lateral abdomen surgical site with induration, erythema, warmth, and purulent drainage and TTP. Nonperitoneal abdominal exam, neg murphy's and mcburney's. Afebrile and otherwise nontoxic. Will get labs, CT chest and abd/pelv to further evaluate the extent of these abscess/wounds. Will give pain meds and nausea meds and reassess shortly. Anticipate likely admission and possible repeat surgical debridement. Discussed case with my attending Dr. Francia Greaves who agrees with plan.   9:28 PM CBC w/diff with neutrophilic leukocytosis WBC 15.3, also with chronic anemia. CMP with chronically low Cl 87, hyperglycemia 176 without acidosis or anion gap changes, marginally low Na 133, marginally elevated Alk Phos 129, and mildly low albumin 3.1, but otherwise fairly unremarkable. Lactic WNL. ESR elevated at 88. CRP elevated at 23.4. EKG unchanged from prior and without acute ischemic findings. Urine and CTs not yet done, but will proceed with consulting CT surgeon to discuss case as he likely will need admission for IV abx and possible repeat surgical I&D.   9:48  PM Dr. Cyndia Bent of CT surgery returning page, agrees with admission for IV abx, requests hospitalist admission; will consult on pt while he's here, and will decide on surgical intervention. He is ok with [place] proceeding with vanc/ancef at this time, not waiting on wound cultures from any operative intervention he may have later. Pt currently at CT. Will consult hospitalist for admission.   10:12 PM U/A not yet done. CT chest showing 2.5cm  fluid collection anterior to xyphoid process. CT abd/pelv showing fluid collection measuring 2.8x8.4x8.3cm at left anterior abd wall including previous anterior pericardial/epicardial leads some of which have been removed, both sites could be infected material (and likely are, given history of illness).  Dr. Blaine Hamper of [facility] returning page and will admit. Holding orders to be placed by admitting team. Please see their notes for further documentation of care. I appreciate their help with this pleasant pt's care. Pt stable at time of admission.    Final Clinical Impressions(s) / ED Diagnoses   Final diagnoses:  Abdominal wall abscess  Chest wall abscess  Neutrophilic leukocytosis  Nausea  Chronic anemia  ESR raised  Type 2 diabetes mellitus with hyperglycemia, with long-term current use of insulin [facility])    ED Discharge Orders    [address], [place], [place] 04/02/18 2214    Valarie Merino, MD 04/04/18 1718

## 2018-04-02 NOTE — Progress Notes (Signed)
Pharmacy Antibiotic Note  Victor [name] is a 82 y.o. male admitted on 04/02/2018 with wound infection. 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 Prescott Gum on 03/28/18 for final post-op visit and notes state some mild erythema and started on 1wk course of doxycycline.  Pharmacy has been consulted for vancomycin dosing. Also with cefazolin 1g IV x 1 ordered per EDP. Afebrile, WBC 15.3, LA 1.84. SCr 1.16 on amit, CrCl~45-50.  Plan: Vancomycin 1250mg  IV q24h Cefazolin 1g IV x 1 per EDP Monitor clinical progress, c/s, renal function F/u de-escalation plan/LOT, vancomycin trough as indicated F/u if cefazolin to continue?  Height: 5\' 7"  (170.2 cm) Weight: 145 lb (65.8 kg) IBW/kg (Calculated) : 66.1  Temp (24hrs), Avg:98 F (36.7 C), Min:98 F (36.7 C), Max:98 F (36.7 C)  Recent Labs  Lab 04/02/18 1916 04/02/18 1928  WBC 15.3*  --   CREATININE 1.16  --   LATICACIDVEN  --  1.84    Estimated Creatinine Clearance: 47.3 mL/min (by C-G formula based on SCr of 1.16 mg/dL).    Allergies  Allergen Reactions  . Coreg [Carvedilol] Other (See Comments)    Shortness of breath ,fatigue ,dizzyness   . Lovastatin Other (See Comments)    CK elevation, Myalgias  . Ativan [Lorazepam] Other (See Comments)    Pt. had side effects    Elicia Lamp, PharmD, BCPS Clinical Pharmacist 04/02/2018 9:56 PM

## 2018-04-02 NOTE — ED Notes (Signed)
Pt back from CT

## 2018-04-02 NOTE — ED Triage Notes (Signed)
Pt arrives via [facility] from home with reports of possible infected sugical site. Pt with recent surgery to remove infected wires from previous aicd. Pt with area or erythema to sternal area. Pt on 3L [place] contiuous. VSS with EMS. Pt with fall approx 1 week ago with pain to L shoulder and arm.

## 2018-04-02 NOTE — H&P (Signed)
History and Physical    PRINTICE HELLMER YCX:[id number] DOB: 10/07/1936 DOA: 04/02/2018  Referring MD/NP/PA:   PCP: Cyndi Bender, PA-C   Patient coming from:  The patient is coming from home.  At baseline, pt is independent for most of ADL.  Chief Complaint: pain, erythema in LUQ abdominal surgical site and sternal area.  HPI: Victor [name] is a 82 y.o. male with medical history significant of anemia, Afib on eliquis, ischemic cardiomyopathy s/p AICD placement, CKD-3, COPD on 3L O2 via [place], CAD, s/p CABG, HTN, HLD, PVD, DM2, stroke, gerd, depression, CHF with EF 30%, OSA not on CPAP, who presents with pain, erythema in LUQ abdominal surgical site and sternal area.  Pt states that has pain and redness in his sternal area for more than 5 days, which has been progressively getting worse. He also has pain, redness and drainage in LUQ abdominal surgical site. The pain is constant, severe, sharp, nonradiating. He has mild subjective fever, but no chills. Per EDP, " pt had an infected LUQ rectus sheath hematoma at site of prior AICD pocket for which he underwent I&D in 12/2017, wound vac and antibiotics were used to heal this site and it subsequently healed well; he was seen by Dr. Prescott Gum on 03/28/18 for his final post-op visit and his notes state that there was some mild erythema (presumably in the sternal area, per pt's recounting), so he was started on 1wk course of doxycycline '100mg'$  BID".  Patient states that he had mild chest pain yesterday, which has resolved.  Currently patient denies chest pain. He has chronic shortness of breath and dry cough, which has not changed.  Patient does not have nausea, vomiting, diarrhea or abdominal pain.  Denies symptoms of UTI or unilateral weakness. He states that he had fall about 3 weeks ago where he missed a step, and now complains of left shoulder pain and some pain in his posterior neck, which he attributed to "degenerative neuropathy".  ED Course: pt was found  to have WBC 15.3, lactic acid 1.84, stable renal function, temperature normal, no tachycardia, has tachypnea, oxygen saturation 81% on room air, 100% on 3 L nasal cannula oxygen patient is admitted to telemetry bed as inpatient. CT surgeon, Dr. Cyndia Bent was consulted  CT-abdomen/pelvis and Ct-chest: 1. Bilateral pleural effusions layering dependently with dependent atelectasis and or pneumonia, left lower lobe predominant. Enlargement of AP window lymph nodes, presumably reactive.  2. Fluid collection anterior to the xiphoid process measuring up to 2.5 cm in diameter. This could be infected material.  3. Fluid at the left anterior abdominal wall including around the previous anterior pericardial/epicardial leads, some of which have been removed. This area measures approximately 2.8 x 8.4 x 8.3 cm. This could simply be postoperative swelling and edema, but the possibility of ongoing or recurrent infection is not excluded. The appearance is better than on the study of 12/26/2017.  Review of Systems:   General: has subjective fever, no chills, no body weight gain, has fatigue HEENT: no blurry vision, hearing changes or sore throat Respiratory: has dyspnea, coughing, no wheezing CV: no chest pain, no palpitations GI: no nausea, vomiting, abdominal pain, diarrhea, constipation GU: no dysuria, burning on urination, increased urinary frequency, hematuria  Ext: no leg edema Neuro: no unilateral weakness, numbness, or tingling, no vision change or hearing loss Skin: pain, erythema in LUQ abdominal surgical site and sternal area. MSK: No muscle spasm, no deformity, no limitation of range of movement in spin Heme: No  easy bruising.  Travel history: No recent long distant travel.  Allergy:  Allergies  Allergen Reactions  . Coreg [Carvedilol] Other (See Comments)    Shortness of breath ,fatigue ,dizzyness   . Lovastatin Other (See Comments)    CK elevation, Myalgias  . Ativan [Lorazepam] Other (See  Comments)    Pt. had side effects    Past Medical History:  Diagnosis Date  . Anemia   . Arthritis   . Atrial fibrillation, persistent ([place]) 12/15/2017  . Cardiomyopathy, ischemic    a. s/p ICD placement  . Chronic combined systolic and diastolic CHF (congestive heart failure) ([place])   . CKD (chronic kidney disease), stage III ([place])   . COPD (chronic obstructive pulmonary disease) ([place])   . Coronary artery disease    a. s/p CABG 1990. b. multiple PCIs since that time including CTO angioplasty (without stenting) PCI of RCA 09/2014, with last cath 02/2015 done for worsening CHF with unsuccessful attempt at PCI of the RCA due to inability to cross occlusion - medical therapy advised at that time.  . Debilitated 12/15/2017  . GERD (gastroesophageal reflux disease)   . HLD (hyperlipidemia)   . Hypertension   . Occlusion and stenosis of carotid artery without mention of cerebral infarction   . On home oxygen therapy    "2L; 24/7" (11/08/2017)  . Orthostatic hypotension   . Paroxysmal ventricular tachycardia ([place])   . Peripheral vascular disease ([place])   . Raynaud's syndrome   . Stroke [facility])    a. CT head 06/2015: Small old lacunar infarct LEFT basal ganglia.  . Thrombocytopenia ([place])   . TIA (transient ischemic attack)    a. s/p LHC on 10/01/14   . Type II diabetes mellitus ([place])     Past Surgical History:  Procedure Laterality Date  . ANAL FISTULECTOMY  2000's  . APPLICATION OF WOUND VAC Left 12/29/2017   Procedure: APPLICATION OF WOUND VAC;  Surgeon: Ivin Poot, MD;  Location: [place];  Service: Vascular;  Laterality: Left;  . CARDIAC CATHETERIZATION  10/01/2014   PTVA to CTO with restoration of TIMI 3  . CATARACT EXTRACTION, BILATERAL Bilateral ~ 2009  . CHEST EXPLORATION N/A 12/11/2017   Procedure: IRRIGATION AND DEBRIDEMENT OF CHEST HEMATOMA;  Surgeon: Ivin Poot, MD;  Location: [place];  Service: Thoracic;  Laterality: N/A;  . CORONARY ARTERY BYPASS GRAFT  01/1989   "CABG  X3"   . FINGER FRACTURE SURGERY Left ~ 2000   "pointer"  . I&D EXTREMITY Left 12/29/2017   Procedure: IRRIGATION AND DEBRIDEMENT ABDOMINAL WALL ABSCESS;  Surgeon: Ivin Poot, MD;  Location: [place];  Service: Vascular;  Laterality: Left;  . IMPLANTABLE CARDIOVERTER DEFIBRILLATOR (ICD) GENERATOR CHANGE Left 10/05/2011   Procedure: ICD GENERATOR CHANGE;  Surgeon: Evans Lance, MD;  Location: [facility] CATH LAB;  Service: Cardiovascular;  Laterality: Left;  . LEFT AND RIGHT HEART CATHETERIZATION WITH CORONARY/GRAFT ANGIOGRAM N/A 04/26/2013   Procedure: LEFT AND RIGHT HEART CATHETERIZATION WITH Beatrix Fetters;  Surgeon: Sherren Mocha, MD;  Location: [facility] CATH LAB;  Service: Cardiovascular;  Laterality: N/A;  . LEFT HEART CATHETERIZATION WITH CORONARY ANGIOGRAM N/A 08/01/2014   Procedure: LEFT HEART CATHETERIZATION WITH CORONARY ANGIOGRAM;  Surgeon: Sinclair Grooms, MD;  Location: [facility] CATH LAB;  Service: Cardiovascular;  Laterality: N/A;  . LEFT HEART CATHETERIZATION WITH CORONARY ANGIOGRAM N/A 03/20/2015   Procedure: LEFT HEART CATHETERIZATION WITH CORONARY ANGIOGRAM;  Surgeon: Peter M Martinique, MD;  Location: [facility] CATH LAB;  Service: Cardiovascular;  Laterality: N/A;  . PERCUTANEOUS STENT INTERVENTION  04/26/2013   Procedure: PERCUTANEOUS STENT INTERVENTION;  Surgeon: Sherren Mocha, MD;  Location: [facility] CATH LAB;  Service: Cardiovascular;;  . RIGHT HEART CATHETERIZATION  10/01/2014   Procedure: RIGHT HEART CATH;  Surgeon: Peter M Martinique, MD;  Location: [facility] CATH LAB;  Service: Cardiovascular;;  . TEE WITHOUT CARDIOVERSION N/A 01/03/2018   Procedure: TRANSESOPHAGEAL ECHOCARDIOGRAM (TEE);  Surgeon: Sueanne Margarita, MD;  Location: [facility] ENDOSCOPY;  Service: Cardiovascular;  Laterality: N/A;    Social History:  reports that he has quit smoking. His smoking use included pipe. He quit after 3.00 years of use. He has never used smokeless tobacco. He reports that he drinks alcohol. He reports that he does not use  drugs.  Family History:  Family History  Problem Relation Age of Onset  . Heart failure Mother 75  . Heart attack Father   . Leukemia Sister   . Stomach cancer Maternal Grandfather      Prior to Admission medications   Medication Sig Start Date End Date Taking? Authorizing Provider  acetaminophen (TYLENOL) 325 MG tablet Take 2 tablets (650 mg total) by mouth every 4 (four) hours as needed for headache or mild pain. 12/15/17  Yes Isaiah Serge, NP  amiodarone (PACERONE) 200 MG tablet Take 1 tablet (200 mg total) by mouth daily. 12/15/17  Yes Isaiah Serge, NP  apixaban (ELIQUIS) 5 MG TABS tablet Take 1 tablet (5 mg total) by mouth 2 (two) times daily. 02/28/18  Yes Martinique, Peter M, MD  bisoprolol (ZEBETA) 5 MG tablet Take 0.5 tablets (2.5 mg total) by mouth daily. 02/28/18  Yes Martinique, Peter M, MD  CALCIUM PO Take 1 tablet by mouth at bedtime.   Yes [provider]  Cyanocobalamin (VITAMIN B-12 PO) Take 1 tablet by mouth daily.    Yes [provider]  docusate sodium (COLACE) 100 MG capsule Take 1 capsule (100 mg total) by mouth at bedtime. 11/09/17  Yes Regalado, Belkys A, MD  doxycycline (VIBRA-TABS) 100 MG tablet Take 1 tablet (100 mg total) by mouth 2 (two) times daily. 03/28/18  Yes Ivin Poot, MD  DULoxetine (CYMBALTA) 30 MG capsule Take 30 mg by mouth daily.  10/13/17  Yes [provider]  ferrous sulfate 325 (65 FE) MG tablet Take 1 tablet (325 mg total) by mouth 2 (two) times daily with a meal. 01/10/18  Yes Alma Friendly, MD  furosemide (LASIX) 40 MG tablet Take 1 tablet (40 mg total) by mouth 2 (two) times daily. 12/15/17 04/02/18 Yes Isaiah Serge, NP  insulin detemir (LEVEMIR) 100 UNIT/ML injection Inject 10 Units into the skin See admin instructions. 10 units in the morning. May increase as needed to keep fasting BS < 130.   Yes [provider]  linagliptin (TRADJENTA) 5 MG TABS tablet Take 1 tablet (5 mg total) by mouth daily.  12/16/17  Yes Isaiah Serge, NP  methocarbamol (ROBAXIN) 750 MG tablet Take 1 tablet (750 mg total) by mouth every 8 (eight) hours as needed for muscle spasms. 12/15/17  Yes Isaiah Serge, NP  nitroGLYCERIN (NITROSTAT) 0.4 MG SL tablet Place 1 tablet (0.4 mg total) under the tongue every 5 (five) minutes as needed for chest pain. 03/15/18  Yes Martinique, Peter M, MD  omeprazole (PRILOSEC) 20 MG capsule Take 20 mg by mouth every morning. 11/29/17  Yes [provider]  polyethylene glycol (MIRALAX / GLYCOLAX) packet Take 17 g by mouth daily. 01/11/18  Yes Alma Friendly, MD  pravastatin (PRAVACHOL) 40 MG tablet Take 1 tablet (40 mg total) by mouth daily at 6 PM. 11/09/17  Yes Regalado, Belkys A, MD  senna-docusate (SENOKOT-S) 8.6-50 MG tablet Take 1 tablet by mouth 2 (two) times daily. 01/10/18  Yes Alma Friendly, MD  traMADol (ULTRAM) 50 MG tablet Take 50 mg by mouth 3 (three) times daily as needed for pain. 10/16/17  Yes [provider]  HYDROcodone-acetaminophen (NORCO/VICODIN) 5-325 MG tablet Take 1 tablet by mouth every 6 (six) hours as needed for moderate pain or severe pain. Patient not taking: Reported on 04/02/2018 01/10/18   Alma Friendly, MD  lidocaine (LIDODERM) 5 % Place 2 patches onto the skin daily. Remove & Discard patch within 12 hours or as directed by MD Patient not taking: Reported on 04/02/2018 12/16/17   Isaiah Serge, NP    Physical Exam: Vitals:   04/02/18 2330 04/03/18 0011 04/03/18 0020 04/03/18 0315  BP: (!) 137/59  (!) 145/65 (!) 108/49  Pulse:   (!) 101 74  Resp: 20  17 (!) 22  Temp:  97.6 F (36.4 C)  98 F (36.7 C)  TempSrc:  Oral  Oral  SpO2:   94% 100%  Weight:  62.1 kg (136 lb 14.5 oz)    Height:  '5\' 7"'$  (1.702 m)     General: Not in acute distress HEENT:       Eyes: PERRL, EOMI, no scleral icterus.       ENT: No discharge from the ears and nose, no pharynx injection, no tonsillar enlargement.        Neck: No JVD, no bruit,  no mass felt. Heme: No neck lymph node enlargement. Cardiac: S1/S2, RRR, No murmurs, No gallops or rubs. Respiratory: No rales, wheezing, rhonchi or rubs. GI: Soft, nondistended, nontender, no rebound pain, no organomegaly, BS present. GU: No hematuria Ext: No pitting leg edema bilaterally. 2+DP/PT pulse bilaterally. Musculoskeletal: No joint deformities, No joint redness or warmth, no limitation of ROM in spin. Skin: : pain, erythema in LUQ abdominal surgical site and sternal area. Has pus drainage in LUQ abdominal surgical site Neuro: Alert, oriented X3, cranial nerves II-XII grossly intact, moves all extremities normally.  Psych: Patient is not psychotic, no suicidal or hemocidal ideation.  Labs on Admission: I have personally reviewed following labs and imaging studies  CBC: Recent Labs  Lab 04/02/18 1916 04/03/18 0039  WBC 15.3* 13.3*  NEUTROABS 12.9*  --   HGB 10.1* 9.5*  HCT 32.4* 30.3*  MCV 81.0 80.6  PLT 272 301   Basic Metabolic Panel: Recent Labs  Lab 04/02/18 1916  NA 133*  K 4.1  CL 87*  CO2 33*  GLUCOSE 176*  BUN 22*  CREATININE 1.16  CALCIUM 8.9   GFR: Estimated Creatinine Clearance: 44.6 mL/min (by C-G formula based on SCr of 1.16 mg/dL). Liver Function Tests: Recent Labs  Lab 04/02/18 1916  AST 23  ALT 16*  ALKPHOS 129*  BILITOT 0.6  PROT 7.7  ALBUMIN 3.1*   No results for input(s): LIPASE, AMYLASE in the last 168 hours. No results for input(s): AMMONIA in the last 168 hours. Coagulation Profile: Recent Labs  Lab 04/03/18 0039  INR 2.38   Cardiac Enzymes: No results for input(s): CKTOTAL, CKMB, CKMBINDEX, TROPONINI in the last 168 hours. BNP (last 3 results) No results for input(s): PROBNP in the last 8760 hours. HbA1C: No results for input(s): HGBA1C in the last 72 hours. CBG: No results  for input(s): GLUCAP in the last 168 hours. Lipid Profile: No results for input(s): CHOL, HDL, LDLCALC, TRIG, CHOLHDL, LDLDIRECT in the last 72  hours. Thyroid Function Tests: No results for input(s): TSH, T4TOTAL, FREET4, T3FREE, THYROIDAB in the last 72 hours. Anemia Panel: No results for input(s): VITAMINB12, FOLATE, FERRITIN, TIBC, IRON, RETICCTPCT in the last 72 hours. Urine analysis:    Component Value Date/Time   COLORURINE YELLOW 04/02/2018 1900   APPEARANCEUR CLEAR 04/02/2018 1900   LABSPEC 1.042 (H) 04/02/2018 1900   PHURINE 5.0 04/02/2018 1900   GLUCOSEU NEGATIVE 04/02/2018 1900   HGBUR NEGATIVE 04/02/2018 1900   BILIRUBINUR NEGATIVE 04/02/2018 1900   KETONESUR NEGATIVE 04/02/2018 1900   PROTEINUR NEGATIVE 04/02/2018 1900   NITRITE NEGATIVE 04/02/2018 1900   LEUKOCYTESUR NEGATIVE 04/02/2018 1900   Sepsis Labs: '@LABRCNTIP'$ (procalcitonin:4,lacticidven:4) ) Recent Results (from the past 240 hour(s))  MRSA PCR Screening     Status: None   Collection Time: 04/03/18 12:42 AM  Result Value Ref Range Status   MRSA by PCR NEGATIVE NEGATIVE Final    Comment:        The GeneXpert MRSA Assay (FDA approved for NASAL specimens only), is one component of a comprehensive MRSA colonization surveillance program. It is not intended to diagnose MRSA infection nor to guide or monitor treatment for MRSA infections. Performed at [facility], [place] [address], [place], [place] [address]      Radiological Exams on Admission: Ct Chest W Contrast  Result Date: 04/02/2018 CLINICAL DATA:  Recent surgery to remove infected wires from previous AICD. Also history of anal fistula repair. EXAM: CT CHEST, ABDOMEN, AND PELVIS WITH CONTRAST TECHNIQUE: Multidetector CT imaging of the chest, abdomen and pelvis was performed following the standard protocol during bolus administration of intravenous contrast. CONTRAST:  166m OMNIPAQUE IOHEXOL 300 MG/ML  SOLN COMPARISON:  CT abdomen 12/26/2017.  CT chest 12/02/2017. FINDINGS: CT CHEST FINDINGS Cardiovascular: Aortic atherosclerosis. Previous median sternotomy and CABG. Coronary artery  calcification. Cardiomegaly. Pacemaker/AICD in place. Pericardial device on the left. Pericardial device anteriorly. Mediastinum/Nodes: Slight enlargement of AP window nodes compared to the previous exam, presumably reactive. Lungs/Pleura: Small effusions layering dependently. Dependent pulmonary atelectasis and or pneumonia, most pronounced in the left lower lobe. Non dependent lungs are clear. Musculoskeletal: Small fluid density area anterior to the xiphoid process measures about 2.5 cm in diameter and could possibly represent a superficial abscess. CT ABDOMEN PELVIS FINDINGS Hepatobiliary: Liver parenchyma appears normal. Calcified gallstones as seen previously. Pancreas: Atrophic change.  No focal finding. Spleen: Normal Adrenals/Urinary Tract: Adrenal glands are normal. Kidneys are normal except for a few benign cysts. Bladder is normal. Stomach/Bowel: No acute bowel finding. Vascular/Lymphatic: Aortic atherosclerosis. Branch vessel atherosclerosis. No aneurysm. No retroperitoneal adenopathy. Reproductive: Normal Other: No free fluid or air. Musculoskeletal: Soft tissue swelling of the left anterior abdominal wall region including the rectus muscle and overlying sheath, surrounding leads, some of which have been removed since the study of February. The area of concern measures up to 2.8 cm in thickness and 8.4 cm right to left and extends over a length of about 8.3 cm. This may simply be postoperative sequela, but ongoing infection is not excluded. Ordinary degenerative changes affect the spine. IMPRESSION: Bilateral pleural effusions layering dependently with dependent atelectasis and or pneumonia, left lower lobe predominant. Enlargement of AP window lymph nodes, presumably reactive. Fluid collection anterior to the xiphoid process measuring up to 2.5 cm in diameter. This could be infected material. Fluid at the left anterior abdominal  wall including around the previous anterior pericardial/epicardial leads,  some of which have been removed. This area measures approximately 2.8 x 8.4 x 8.3 cm. This could simply be postoperative swelling and edema, but the possibility of ongoing or recurrent infection is not excluded. The appearance is better than on the study of 12/26/2017. Electronically Signed   By: Nelson Chimes M.D.   On: 04/02/2018 22:03   Ct Abdomen Pelvis W Contrast  Result Date: 04/02/2018 CLINICAL DATA:  Recent surgery to remove infected wires from previous AICD. Also history of anal fistula repair. EXAM: CT CHEST, ABDOMEN, AND PELVIS WITH CONTRAST TECHNIQUE: Multidetector CT imaging of the chest, abdomen and pelvis was performed following the standard protocol during bolus administration of intravenous contrast. CONTRAST:  125m OMNIPAQUE IOHEXOL 300 MG/ML  SOLN COMPARISON:  CT abdomen 12/26/2017.  CT chest 12/02/2017. FINDINGS: CT CHEST FINDINGS Cardiovascular: Aortic atherosclerosis. Previous median sternotomy and CABG. Coronary artery calcification. Cardiomegaly. Pacemaker/AICD in place. Pericardial device on the left. Pericardial device anteriorly. Mediastinum/Nodes: Slight enlargement of AP window nodes compared to the previous exam, presumably reactive. Lungs/Pleura: Small effusions layering dependently. Dependent pulmonary atelectasis and or pneumonia, most pronounced in the left lower lobe. Non dependent lungs are clear. Musculoskeletal: Small fluid density area anterior to the xiphoid process measures about 2.5 cm in diameter and could possibly represent a superficial abscess. CT ABDOMEN PELVIS FINDINGS Hepatobiliary: Liver parenchyma appears normal. Calcified gallstones as seen previously. Pancreas: Atrophic change.  No focal finding. Spleen: Normal Adrenals/Urinary Tract: Adrenal glands are normal. Kidneys are normal except for a few benign cysts. Bladder is normal. Stomach/Bowel: No acute bowel finding. Vascular/Lymphatic: Aortic atherosclerosis. Branch vessel atherosclerosis. No aneurysm. No  retroperitoneal adenopathy. Reproductive: Normal Other: No free fluid or air. Musculoskeletal: Soft tissue swelling of the left anterior abdominal wall region including the rectus muscle and overlying sheath, surrounding leads, some of which have been removed since the study of February. The area of concern measures up to 2.8 cm in thickness and 8.4 cm right to left and extends over a length of about 8.3 cm. This may simply be postoperative sequela, but ongoing infection is not excluded. Ordinary degenerative changes affect the spine. IMPRESSION: Bilateral pleural effusions layering dependently with dependent atelectasis and or pneumonia, left lower lobe predominant. Enlargement of AP window lymph nodes, presumably reactive. Fluid collection anterior to the xiphoid process measuring up to 2.5 cm in diameter. This could be infected material. Fluid at the left anterior abdominal wall including around the previous anterior pericardial/epicardial leads, some of which have been removed. This area measures approximately 2.8 x 8.4 x 8.3 cm. This could simply be postoperative swelling and edema, but the possibility of ongoing or recurrent infection is not excluded. The appearance is better than on the study of 12/26/2017. Electronically Signed   By: MNelson ChimesM.D.   On: 04/02/2018 22:03     EKG: Independently reviewed.  QTC 495, T wave inversion in lead III/aVF and V3-V6, RAD,   Ass essment/Plan Principal Problem:   Sepsis ([place] Active Problems:   Type II diabetes mellitus with renal manifestations (HCC)   Automatic implantable cardioverter-defibrillator in situ   Chronic systolic CHF (congestive heart failure) (HCC)   Essential hypertension   GERD (gastroesophageal reflux disease)   Hx of CABG 1990   Stage 3 chronic kidney disease (HCC)   COPD (chronic obstructive pulmonary disease) (HCC)   Abscess of abdominal wall   Abscess of chest wall   Atrial fibrillation, chronic ([place]   HLD  (  hyperlipidemia)   Stroke [facility])   CAD (coronary artery disease)   Fall   Sepsis due to possible abscess in abdominal wall and chest wall: Patient meets riteria for sepsis with leukocytosis and tachypnea.  Lactic acid is normal.  Hemodynamically stable currently. Thoracic surgeon, Dr. Cyndia Bent was consulted by EDP  - will admit to tele bed as inpt - Empiric antimicrobial treatment with vancomycin (pt received 1 dose of Ancef) - PRN Zofran for nausea, and Percocet for pain - Blood cultures x 2  - ESR and CRP - will get Procalcitonin and trend lactic acid levels per sepsis protocol. - IVF: will not give IVF due to low EF and normal lactic acid level  Fall: pt complains of now complains of left shoulder pain and some pain in his posterior neck. -f/u Ct-head and c spin -f/u x-ray of left shoulder -prn percoet for pain -pt/ot when able to (not ordered yet)  Type II diabetes mellitus with renal manifestations ([place]): Last A1c 8.6 on 12/26/17, poorly controled. Patient is taking Levemir, Linaglipitin at home -will decrease levemir dose from 10 to 7 U daily -SSI  HTN:  -Continue home medications: Zebeta -IV hydralazine prn  GERD: -Protonix  CAD: Hx of CABG 1990.  Currently no chest pain. -Continue Zebeta and pravastatin  Stage 3 chronic kidney disease ([place]): stable.  Baseline creatinine 1.0-1.2.  His creatinine is 1.16, BUN 22 -f/u by BMP  COPD (chronic obstructive pulmonary disease) ([place]): Stable. On 3L O2 at home -prn albuterol nebs  HLD: -Pravastatin  Atrial Fibrillation: CHA2DS2-VASc Score is 8, needs oral anticoagulation. Patient is on Eliquis at home. Heart rate is well controlled. -continue Zebeta and  Amiodarone-hold Eliqyuis due to possible proceture  Chronic systolic CHF: 2D echo on 2/55/2589 showed EF of 30-35%.  Patient has trace leg edema, no JVD.  No worsening shortness of breath.  CHF seems to be compensated now.  BNP is 1394, indicating that pt is at risk of CHF  exacerbation. -continue home dose of Lasix 40 mg twice a day  Hx of stroke ([place]): -on Eliquis at home (on hold now) -Pravastatin   DVT ppx: SCD Code Status: DNR (I discussed with patient and explained the meaning of CODE STATUS. Patient  wants to be DNR) Family Communication: None at bed side. Disposition Plan:  Anticipate discharge back to previous home environment Consults called: Thoracic surgeon, Dr. Cyndia Bent Admission status: Inpatient/tele      Date of Service 04/03/2018    Ivor Costa [facility] Pager [phone]  If 7PM-7AM, please contact night-coverage [email or web address] Password [facility] 04/03/2018, 5:17 AM

## 2018-04-03 ENCOUNTER — Inpatient Hospital Stay (HOSPITAL_COMMUNITY): Payer: Medicare Other

## 2018-04-03 ENCOUNTER — Other Ambulatory Visit: Payer: Self-pay

## 2018-04-03 ENCOUNTER — Encounter (HOSPITAL_COMMUNITY): Payer: Self-pay

## 2018-04-03 DIAGNOSIS — I2583 Coronary atherosclerosis due to lipid rich plaque: Secondary | ICD-10-CM

## 2018-04-03 DIAGNOSIS — I5023 Acute on chronic systolic (congestive) heart failure: Secondary | ICD-10-CM

## 2018-04-03 DIAGNOSIS — L02211 Cutaneous abscess of abdominal wall: Secondary | ICD-10-CM

## 2018-04-03 DIAGNOSIS — E43 Unspecified severe protein-calorie malnutrition: Secondary | ICD-10-CM

## 2018-04-03 LAB — URINALYSIS, ROUTINE W REFLEX MICROSCOPIC
BILIRUBIN URINE: NEGATIVE
Bilirubin Urine: NEGATIVE
GLUCOSE, UA: NEGATIVE mg/dL
Glucose, UA: NEGATIVE mg/dL
HGB URINE DIPSTICK: NEGATIVE
Hgb urine dipstick: NEGATIVE
Ketones, ur: NEGATIVE mg/dL
Ketones, ur: NEGATIVE mg/dL
Leukocytes, UA: NEGATIVE
Leukocytes, UA: NEGATIVE
Nitrite: NEGATIVE
Nitrite: NEGATIVE
PH: 5 (ref 5.0–8.0)
Protein, ur: NEGATIVE mg/dL
Protein, ur: NEGATIVE mg/dL
SPECIFIC GRAVITY, URINE: 1.042 — AB (ref 1.005–1.030)
Specific Gravity, Urine: 1.026 (ref 1.005–1.030)
pH: 5 (ref 5.0–8.0)

## 2018-04-03 LAB — PROTIME-INR
INR: 2.38
Prothrombin Time: 25.8 seconds — ABNORMAL HIGH (ref 11.4–15.2)

## 2018-04-03 LAB — BRAIN NATRIURETIC PEPTIDE: B Natriuretic Peptide: 1390.4 pg/mL — ABNORMAL HIGH (ref 0.0–100.0)

## 2018-04-03 LAB — GLUCOSE, CAPILLARY
GLUCOSE-CAPILLARY: 120 mg/dL — AB (ref 65–99)
GLUCOSE-CAPILLARY: 162 mg/dL — AB (ref 65–99)
Glucose-Capillary: 143 mg/dL — ABNORMAL HIGH (ref 65–99)
Glucose-Capillary: 199 mg/dL — ABNORMAL HIGH (ref 65–99)

## 2018-04-03 LAB — CBC
HCT: 30.3 % — ABNORMAL LOW (ref 39.0–52.0)
HEMOGLOBIN: 9.5 g/dL — AB (ref 13.0–17.0)
MCH: 25.3 pg — AB (ref 26.0–34.0)
MCHC: 31.4 g/dL (ref 30.0–36.0)
MCV: 80.6 fL (ref 78.0–100.0)
Platelets: 260 10*3/uL (ref 150–400)
RBC: 3.76 MIL/uL — AB (ref 4.22–5.81)
RDW: 15 % (ref 11.5–15.5)
WBC: 13.3 10*3/uL — ABNORMAL HIGH (ref 4.0–10.5)

## 2018-04-03 LAB — PROCALCITONIN: Procalcitonin: 0.28 ng/mL

## 2018-04-03 LAB — LACTIC ACID, PLASMA: Lactic Acid, Venous: 1.8 mmol/L (ref 0.5–1.9)

## 2018-04-03 LAB — MRSA PCR SCREENING: MRSA BY PCR: NEGATIVE

## 2018-04-03 MED ORDER — VANCOMYCIN HCL IN DEXTROSE 1-5 GM/200ML-% IV SOLN
1000.0000 mg | INTRAVENOUS | Status: DC
  Administered 2018-04-03: 1000 mg via INTRAVENOUS
  Filled 2018-04-03: qty 200

## 2018-04-03 MED ORDER — RIFAMPIN 300 MG PO CAPS
300.0000 mg | ORAL_CAPSULE | Freq: Two times a day (BID) | ORAL | Status: DC
  Administered 2018-04-03 – 2018-04-06 (×6): 300 mg via ORAL
  Filled 2018-04-03 (×6): qty 1

## 2018-04-03 MED ORDER — ENSURE ENLIVE PO LIQD
237.0000 mL | Freq: Three times a day (TID) | ORAL | Status: DC
  Administered 2018-04-03 – 2018-04-13 (×17): 237 mL via ORAL

## 2018-04-03 MED ORDER — GUAIFENESIN-DM 100-10 MG/5ML PO SYRP
5.0000 mL | ORAL_SOLUTION | ORAL | Status: DC | PRN
  Administered 2018-04-03 – 2018-04-06 (×5): 5 mL via ORAL
  Filled 2018-04-03 (×5): qty 5

## 2018-04-03 MED ORDER — ADULT MULTIVITAMIN W/MINERALS CH
1.0000 | ORAL_TABLET | Freq: Every day | ORAL | Status: DC
  Administered 2018-04-03 – 2018-04-13 (×10): 1 via ORAL
  Filled 2018-04-03 (×11): qty 1

## 2018-04-03 MED ORDER — FUROSEMIDE 10 MG/ML IJ SOLN
60.0000 mg | Freq: Once | INTRAMUSCULAR | Status: AC
  Administered 2018-04-03: 60 mg via INTRAVENOUS
  Filled 2018-04-03: qty 6

## 2018-04-03 MED ORDER — ALBUTEROL SULFATE (2.5 MG/3ML) 0.083% IN NEBU: 2.5000 mg | INHALATION_SOLUTION | Freq: Four times a day (QID) | RESPIRATORY_TRACT | Status: DC | PRN

## 2018-04-03 MED ORDER — ALBUMIN HUMAN 5 % IV SOLN
12.5000 g | Freq: Once | INTRAVENOUS | Status: AC
  Administered 2018-04-03: 12.5 g via INTRAVENOUS
  Filled 2018-04-03: qty 250

## 2018-04-03 MED ORDER — OXYCODONE-ACETAMINOPHEN 5-325 MG PO TABS
1.0000 | ORAL_TABLET | ORAL | Status: DC | PRN
  Administered 2018-04-03 – 2018-04-13 (×18): 1 via ORAL
  Filled 2018-04-03 (×19): qty 1

## 2018-04-03 NOTE — Progress Notes (Signed)
New low bed; fall mats still in place.   Dennette Faulconer, RN  

## 2018-04-03 NOTE — Progress Notes (Signed)
PROGRESS NOTE    [name]  PNT:[id number] DOB: 02-Aug-1936 DOA: 04/02/2018 PCP: Cyndi Bender, PA-C      Brief Narrative:  Victor [name] is a 82 y.o. M with COPD on 3L home O2, CAD s/p CABG, CHF EF 30% with ICD, HTN, PVD, DM, stroke, and COPD not on CPAP who presents with sternal redness for 4 days.   Assessment & Plan:  Sternal abscess Abdomen wall abscess Without sepsis.  The patient has no evidence of end organ damage or septic physiology.  He is comfortable, has had mild redness and pain of the sternum and a 2.5cm fluid collection there on CT.  He also has large fluid collection in the abdomen wall.  No fever, chills.  He is just tired, and anorexic. -Stop antibiotics until evaluated by CT surgery and plan is clear -Consult to CT surgery  Acute on chronic systolic and diastolic CHF Pleural effusions on CT chest, orthopnea, dyspnea on exertion.  BNP elevated.  JVP elevated. -IV Lasix -Strict ins and outs, daily weights  Type 2 diabetes -Continue home Levemir -Sliding scale with meals  Hypertension Coronary artery disease History of stroke -Continue beta-blocker, statin  COPD Chronic respiratory failure 3 L home O2 No active disease.  Atrial fibrillation, permanent Chads 2 vascular 8.  On Eliquis at home, currently held. -Continue Zebeta, amiodarone - Hold Eliquis for now until CT surgery plan clear  Other medications -Continue duloxetine  Anemia Unclear etiology -Continue iron, B12   Tonic kidney disease stage III Baseline creatinine 1.0-1.2.      DVT prophylaxis: SCDs Code Status: DO NOT RESUSICTATE Family Communication: None present MDM and disposition Plan: The below labs and imaging reports were reviewed and sumamrized above.  The patient's status is clinically stable.    He was admitted with redness of the chest, found to have fluid collection narrowing in his anterior abdomen, presumed to be abscess.  CT surgery has been following the patient  and will evaluate him this afternoon for possible operative management.  Antibiotics on hold until then.   Consultants:   CT surgery  Procedures:   None  Antimicrobials:   Vancomycin and cefazolin yesterday   Subjective: Feels fine, mild pain at the chest, discharge from his left abdomen, scant.  No fever, chills.  Poor appetite, feels tired overall.  Orthopneic, short of breath with any exertion.  Woke up with some dyspnea this morning, currently at rest he is asymptomatic.  Objective: Vitals:   04/03/18 0011 04/03/18 0020 04/03/18 0315 04/03/18 0800  BP:  (!) 145/65 (!) 108/49 118/79  Pulse:  (!) 101 74   Resp:  17 (!) 22   Temp: 97.6 F (36.4 C)  98 F (36.7 C) 98.4 F (36.9 C)  TempSrc: Oral  Oral Oral  SpO2:  94% 100%   Weight: 62.1 kg (136 lb 14.5 oz)     Height: 5\' 7"  (1.702 m)       Intake/Output Summary (Last 24 hours) at 04/03/2018 1054 Last data filed at 04/03/2018 0141 Gross per 24 hour  Intake 250 ml  Output 200 ml  Net 50 ml   Filed Weights   04/02/18 1829 04/03/18 0011  Weight: 65.8 kg (145 lb) 62.1 kg (136 lb 14.5 oz)    Examination: General appearance: Elderly adult male, alert and in no acute distress.  Lying in bed, interactive. HEENT: Anicteric, conjunctiva pink, lids and lashes normal. No nasal deformity, discharge, epistaxis.  Lips moist, teeth normal.  Oropharynx moist, no oral  lesions.   Skin: Warm and dry.  No jaundice.  Over the sternum there is some redness/pain, may be a little fluctuance, no induration.  Over the left lower abdomen, there is an old wound, which is draining thick yellowish-greenish material, scant, no surrounding redness or induration. Cardiac: Normal rate, regular rhythm, nl S1-S2, no murmurs appreciated.  Capillary refill is brisk.  JVP elevated.  No LE edema.  Radial pulses 2+ and symmetric. Respiratory: Normal respiratory rate and rhythm.  Bibasilar rales, no wheezes. Abdomen: Abdomen soft.  Mild TTP near the lower  abdomen fluid drainage. No ascites, distension, hepatosplenomegaly.   MSK: No deformities or effusions. Neuro: Awake and alert.  EOMI, moves all extremities. Speech fluent.    Psych: Sensorium intact and responding to questions, attention normal. Affect normal.  Judgment and insight appear normal.    Data Reviewed: I have personally reviewed following labs and imaging studies:  CBC: Recent Labs  Lab 04/02/18 1916 04/03/18 0039  WBC 15.3* 13.3*  NEUTROABS 12.9*  --   HGB 10.1* 9.5*  HCT 32.4* 30.3*  MCV 81.0 80.6  PLT 272 742   Basic Metabolic Panel: Recent Labs  Lab 04/02/18 1916  NA 133*  K 4.1  CL 87*  CO2 33*  GLUCOSE 176*  BUN 22*  CREATININE 1.16  CALCIUM 8.9   GFR: Estimated Creatinine Clearance: 44.6 mL/min (by C-G formula based on SCr of 1.16 mg/dL). Liver Function Tests: Recent Labs  Lab 04/02/18 1916  AST 23  ALT 16*  ALKPHOS 129*  BILITOT 0.6  PROT 7.7  ALBUMIN 3.1*   No results for input(s): LIPASE, AMYLASE in the last 168 hours. No results for input(s): AMMONIA in the last 168 hours. Coagulation Profile: Recent Labs  Lab 04/03/18 0039  INR 2.38   Cardiac Enzymes: No results for input(s): CKTOTAL, CKMB, CKMBINDEX, TROPONINI in the last 168 hours. BNP (last 3 results) No results for input(s): PROBNP in the last 8760 hours. HbA1C: No results for input(s): HGBA1C in the last 72 hours. CBG: Recent Labs  Lab 04/03/18 0840  GLUCAP 143*   Lipid Profile: No results for input(s): CHOL, HDL, LDLCALC, TRIG, CHOLHDL, LDLDIRECT in the last 72 hours. Thyroid Function Tests: No results for input(s): TSH, T4TOTAL, FREET4, T3FREE, THYROIDAB in the last 72 hours. Anemia Panel: No results for input(s): VITAMINB12, FOLATE, FERRITIN, TIBC, IRON, RETICCTPCT in the last 72 hours. Urine analysis:    Component Value Date/Time   COLORURINE YELLOW 04/02/2018 1900   APPEARANCEUR CLEAR 04/02/2018 1900   LABSPEC 1.042 (H) 04/02/2018 1900   PHURINE 5.0  04/02/2018 1900   GLUCOSEU NEGATIVE 04/02/2018 1900   HGBUR NEGATIVE 04/02/2018 1900   BILIRUBINUR NEGATIVE 04/02/2018 1900   KETONESUR NEGATIVE 04/02/2018 1900   PROTEINUR NEGATIVE 04/02/2018 1900   NITRITE NEGATIVE 04/02/2018 1900   LEUKOCYTESUR NEGATIVE 04/02/2018 1900   Sepsis Labs: @LABRCNTIP (procalcitonin:4,lacticacidven:4)  ) Recent Results (from the past 240 hour(s))  Culture, blood (Routine X 2) w Reflex to ID Panel     Status: None (Preliminary result)   Collection Time: 04/02/18  7:19 PM  Result Value Ref Range Status   Specimen Description BLOOD RIGHT ANTECUBITAL  Final   Special Requests   Final    BOTTLES DRAWN AEROBIC AND ANAEROBIC Blood Culture adequate volume   Culture   Final    NO GROWTH < 24 HOURS Performed at [facility], [place] [address] [place], [place] [address]    Report Status PENDING  Incomplete  MRSA PCR Screening  Status: None   Collection Time: 04/03/18 12:42 AM  Result Value Ref Range Status   MRSA by PCR NEGATIVE NEGATIVE Final    Comment:        The GeneXpert MRSA Assay (FDA approved for NASAL specimens only), is one component of a comprehensive MRSA colonization surveillance program. It is not intended to diagnose MRSA infection nor to guide or monitor treatment for MRSA infections. Performed at [facility], [place] [address] [place], [place] [address]          Radiology Studies: Ct Head Wo Contrast  Result Date: 04/03/2018 CLINICAL DATA:  81 year old male fell 1 week ago hurting left shoulder. Bilateral neck pain. Denies injury to head or loss of consciousness. Initial encounter. EXAM: CT HEAD WITHOUT CONTRAST CT CERVICAL SPINE WITHOUT CONTRAST TECHNIQUE: Multidetector CT imaging of the head and cervical spine was performed following the standard protocol without intravenous contrast. Multiplanar CT image reconstructions of the cervical spine were also generated. COMPARISON:  07/16/2017 head CT.  10/23/2015 cervical  spine CT. FINDINGS: CT HEAD FINDINGS Brain: No intracranial hemorrhage or CT evidence of large acute infarct. Chronic microvascular changes. Mild global atrophy. Left parietal 5 mm dural-based calcification may represent small meningioma without associated vasogenic edema and unchanged. Vascular: Vascular calcifications Skull: No skull fracture Sinuses/Orbits: No acute orbital abnormality. Visualized paranasal sinuses are clear. Other: Mastoid air cells and middle ear cavities are clear. CT CERVICAL SPINE FINDINGS Alignment: Alignment similar to prior cervical spine CT. Minimal anterior slip C3 and C4. Skull base and vertebrae: No cervical spine fracture. Soft tissues and spinal canal: No abnormal prevertebral soft tissue swelling. Disc levels: Transverse ligament hypertrophy. Multilevel cervical spondylotic changes C3-4 through C6-7 have progressed slightly since prior exam. Upper chest: Bilateral pleural effusions layering posteriorly. Other: Prominent caries. Pacemaker in place. Prominent carotid bifurcation calcifications. IMPRESSION: CT HEAD: No skull fracture or intracranial hemorrhage. Chronic microvascular changes. Mild global atrophy. Left parietal 5 mm dural-based calcification may represent small meningioma without associated vasogenic edema and unchanged. CT CERVICAL SPINE: Alignment similar to prior cervical spine CT. Minimal anterior slip C3 and C4. No cervical spine fracture or abnormal prevertebral soft tissue swelling. Multilevel cervical spondylotic changes C3-4 through C6-7 have progressed slightly since prior exam. Bilateral pleural effusions layering posteriorly. Prominent carotid bifurcation calcifications. Electronically Signed   By: Genia Del M.D.   On: 04/03/2018 10:12   Ct Chest W Contrast  Result Date: 04/02/2018 CLINICAL DATA:  Recent surgery to remove infected wires from previous AICD. Also history of anal fistula repair. EXAM: CT CHEST, ABDOMEN, AND PELVIS WITH CONTRAST  TECHNIQUE: Multidetector CT imaging of the chest, abdomen and pelvis was performed following the standard protocol during bolus administration of intravenous contrast. CONTRAST:  19mL OMNIPAQUE IOHEXOL 300 MG/ML  SOLN COMPARISON:  CT abdomen 12/26/2017.  CT chest 12/02/2017. FINDINGS: CT CHEST FINDINGS Cardiovascular: Aortic atherosclerosis. Previous median sternotomy and CABG. Coronary artery calcification. Cardiomegaly. Pacemaker/AICD in place. Pericardial device on the left. Pericardial device anteriorly. Mediastinum/Nodes: Slight enlargement of AP window nodes compared to the previous exam, presumably reactive. Lungs/Pleura: Small effusions layering dependently. Dependent pulmonary atelectasis and or pneumonia, most pronounced in the left lower lobe. Non dependent lungs are clear. Musculoskeletal: Small fluid density area anterior to the xiphoid process measures about 2.5 cm in diameter and could possibly represent a superficial abscess. CT ABDOMEN PELVIS FINDINGS Hepatobiliary: Liver parenchyma appears normal. Calcified gallstones as seen previously. Pancreas: Atrophic change.  No focal finding. Spleen: Normal Adrenals/Urinary Tract: Adrenal glands are  normal. Kidneys are normal except for a few benign cysts. Bladder is normal. Stomach/Bowel: No acute bowel finding. Vascular/Lymphatic: Aortic atherosclerosis. Branch vessel atherosclerosis. No aneurysm. No retroperitoneal adenopathy. Reproductive: Normal Other: No free fluid or air. Musculoskeletal: Soft tissue swelling of the left anterior abdominal wall region including the rectus muscle and overlying sheath, surrounding leads, some of which have been removed since the study of February. The area of concern measures up to 2.8 cm in thickness and 8.4 cm right to left and extends over a length of about 8.3 cm. This may simply be postoperative sequela, but ongoing infection is not excluded. Ordinary degenerative changes affect the spine. IMPRESSION: Bilateral  pleural effusions layering dependently with dependent atelectasis and or pneumonia, left lower lobe predominant. Enlargement of AP window lymph nodes, presumably reactive. Fluid collection anterior to the xiphoid process measuring up to 2.5 cm in diameter. This could be infected material. Fluid at the left anterior abdominal wall including around the previous anterior pericardial/epicardial leads, some of which have been removed. This area measures approximately 2.8 x 8.4 x 8.3 cm. This could simply be postoperative swelling and edema, but the possibility of ongoing or recurrent infection is not excluded. The appearance is better than on the study of 12/26/2017. Electronically Signed   By: Nelson Chimes M.D.   On: 04/02/2018 22:03   Ct Cervical Spine Wo Contrast  Result Date: 04/03/2018 CLINICAL DATA:  82 year old male fell 1 week ago hurting left shoulder. Bilateral neck pain. Denies injury to head or loss of consciousness. Initial encounter. EXAM: CT HEAD WITHOUT CONTRAST CT CERVICAL SPINE WITHOUT CONTRAST TECHNIQUE: Multidetector CT imaging of the head and cervical spine was performed following the standard protocol without intravenous contrast. Multiplanar CT image reconstructions of the cervical spine were also generated. COMPARISON:  07/16/2017 head CT.  10/23/2015 cervical spine CT. FINDINGS: CT HEAD FINDINGS Brain: No intracranial hemorrhage or CT evidence of large acute infarct. Chronic microvascular changes. Mild global atrophy. Left parietal 5 mm dural-based calcification may represent small meningioma without associated vasogenic edema and unchanged. Vascular: Vascular calcifications Skull: No skull fracture Sinuses/Orbits: No acute orbital abnormality. Visualized paranasal sinuses are clear. Other: Mastoid air cells and middle ear cavities are clear. CT CERVICAL SPINE FINDINGS Alignment: Alignment similar to prior cervical spine CT. Minimal anterior slip C3 and C4. Skull base and vertebrae: No  cervical spine fracture. Soft tissues and spinal canal: No abnormal prevertebral soft tissue swelling. Disc levels: Transverse ligament hypertrophy. Multilevel cervical spondylotic changes C3-4 through C6-7 have progressed slightly since prior exam. Upper chest: Bilateral pleural effusions layering posteriorly. Other: Prominent caries. Pacemaker in place. Prominent carotid bifurcation calcifications. IMPRESSION: CT HEAD: No skull fracture or intracranial hemorrhage. Chronic microvascular changes. Mild global atrophy. Left parietal 5 mm dural-based calcification may represent small meningioma without associated vasogenic edema and unchanged. CT CERVICAL SPINE: Alignment similar to prior cervical spine CT. Minimal anterior slip C3 and C4. No cervical spine fracture or abnormal prevertebral soft tissue swelling. Multilevel cervical spondylotic changes C3-4 through C6-7 have progressed slightly since prior exam. Bilateral pleural effusions layering posteriorly. Prominent carotid bifurcation calcifications. Electronically Signed   By: Genia Del M.D.   On: 04/03/2018 10:12   Ct Abdomen Pelvis W Contrast  Result Date: 04/02/2018 CLINICAL DATA:  Recent surgery to remove infected wires from previous AICD. Also history of anal fistula repair. EXAM: CT CHEST, ABDOMEN, AND PELVIS WITH CONTRAST TECHNIQUE: Multidetector CT imaging of the chest, abdomen and pelvis was performed following the standard protocol during bolus  administration of intravenous contrast. CONTRAST:  150mL OMNIPAQUE IOHEXOL 300 MG/ML  SOLN COMPARISON:  CT abdomen 12/26/2017.  CT chest 12/02/2017. FINDINGS: CT CHEST FINDINGS Cardiovascular: Aortic atherosclerosis. Previous median sternotomy and CABG. Coronary artery calcification. Cardiomegaly. Pacemaker/AICD in place. Pericardial device on the left. Pericardial device anteriorly. Mediastinum/Nodes: Slight enlargement of AP window nodes compared to the previous exam, presumably reactive. Lungs/Pleura:  Small effusions layering dependently. Dependent pulmonary atelectasis and or pneumonia, most pronounced in the left lower lobe. Non dependent lungs are clear. Musculoskeletal: Small fluid density area anterior to the xiphoid process measures about 2.5 cm in diameter and could possibly represent a superficial abscess. CT ABDOMEN PELVIS FINDINGS Hepatobiliary: Liver parenchyma appears normal. Calcified gallstones as seen previously. Pancreas: Atrophic change.  No focal finding. Spleen: Normal Adrenals/Urinary Tract: Adrenal glands are normal. Kidneys are normal except for a few benign cysts. Bladder is normal. Stomach/Bowel: No acute bowel finding. Vascular/Lymphatic: Aortic atherosclerosis. Branch vessel atherosclerosis. No aneurysm. No retroperitoneal adenopathy. Reproductive: Normal Other: No free fluid or air. Musculoskeletal: Soft tissue swelling of the left anterior abdominal wall region including the rectus muscle and overlying sheath, surrounding leads, some of which have been removed since the study of February. The area of concern measures up to 2.8 cm in thickness and 8.4 cm right to left and extends over a length of about 8.3 cm. This may simply be postoperative sequela, but ongoing infection is not excluded. Ordinary degenerative changes affect the spine. IMPRESSION: Bilateral pleural effusions layering dependently with dependent atelectasis and or pneumonia, left lower lobe predominant. Enlargement of AP window lymph nodes, presumably reactive. Fluid collection anterior to the xiphoid process measuring up to 2.5 cm in diameter. This could be infected material. Fluid at the left anterior abdominal wall including around the previous anterior pericardial/epicardial leads, some of which have been removed. This area measures approximately 2.8 x 8.4 x 8.3 cm. This could simply be postoperative swelling and edema, but the possibility of ongoing or recurrent infection is not excluded. The appearance is better  than on the study of 12/26/2017. Electronically Signed   By: Nelson Chimes M.D.   On: 04/02/2018 22:03   Dg Shoulder Left  Result Date: 04/03/2018 CLINICAL DATA:  Left shoulder pain after fall 2 weeks ago. EXAM: LEFT SHOULDER - 2+ VIEW COMPARISON:  Radiographs of February 17, 2006. FINDINGS: There is no evidence of fracture or dislocation. Mild degenerative changes seen involving the left acromioclavicular joint. Soft tissues are unremarkable. IMPRESSION: Mild degenerative joint disease of left acromioclavicular joint. No acute abnormality seen in the left shoulder. Electronically Signed   By: Marijo Conception, M.D.   On: 04/03/2018 09:27        Scheduled Meds: . amiodarone  200 mg Oral Daily  . bisoprolol  2.5 mg Oral Daily  . calcium-vitamin D  1 tablet Oral QHS  . docusate sodium  100 mg Oral QHS  . DULoxetine  30 mg Oral Daily  . ferrous sulfate  325 mg Oral BID WC  . insulin aspart  0-9 Units Subcutaneous TID WC  . insulin detemir  7 Units Subcutaneous BH-[id number]  . pantoprazole  40 mg Oral Daily  . polyethylene glycol  17 g Oral Daily  . pravastatin  40 mg Oral q1800  . senna-docusate  1 tablet Oral BID  . vitamin B-12  100 mcg Oral Daily   Continuous Infusions:   LOS: 1 day    Time spent: 25 minutes    Edwin Dada, MD [facility]  04/03/2018, 10:54 AM     Pager [phone] --- please page though AMION:  [email or web address] If 7PM-7AM, please contact night-coverage

## 2018-04-03 NOTE — Progress Notes (Addendum)
Initial Nutrition Assessment  DOCUMENTATION CODES:   Severe malnutrition in context of chronic illness  INTERVENTION:   -Ensure Enlive po TID, each supplement provides 350 kcal and 20 grams of protein -MVI daily  NUTRITION DIAGNOSIS:   Severe Malnutrition related to chronic illness(CHF) as evidenced by moderate fat depletion, severe fat depletion, moderate muscle depletion, severe muscle depletion, percent weight loss.  GOAL:   Patient will meet greater than or equal to 90% of their needs  MONITOR:   PO intake, Supplement acceptance, Labs, Weight trends, Skin, I & O's  REASON FOR ASSESSMENT:   Malnutrition Screening Tool    ASSESSMENT:   Victor [name] is a 82 y.o. male with medical history significant of anemia, Afib on eliquis, ischemic cardiomyopathy s/p AICD placement, CKD-3, COPD on 3L O2 via [place], CAD, s/p CABG, HTN, HLD, PVD, DM2, stroke, gerd, depression, CHF with EF 30%, OSA not on CPAP, who presents with pain, erythema in LUQ abdominal surgical site and sternal area.  Pt admitted with sternal abscess. He is awaiting CTS evaluation.   Reviewed I/O's: +50 ml since admission  Pt very lethargic at time of visit; states "I'm sleepy" when RD greeted him. He endorses a very poor appetite over the past month and estimates he has lost around 20# over the past 3 months, due to poor oral intake. Reviewed wt hx; noted pt has experienced a 17% wt loss over the past 6 months, which is significant for time frame.   Last Hgb A1c: 8.6 (12/26/17). PTA DM medications 5 mg linagliptin daily and 10 units insulin detemir daily.   Pt with poor oral intake and would benefit from nutrient dense supplement. One Ensure Enlive supplement provides 350 kcals, 20 grams protein, and 44-45 grams of carbohydrate vs one Glucerna shake supplement, which provides 220 kcals, 10 grams of protein, and 26 grams of carbohydrate. Given pt's hx of DM, RD will continue to monitor PO intake, CBGS, and adjust  supplement regimen as appropriate.   Labs reviewed: CBGS: 120-143 (inpatient orders for glycemic control are 0-9 units insulin aspart TID with meals and 7 units insulin detemir q AM).   NUTRITION - FOCUSED PHYSICAL EXAM:    Most Recent Value  Orbital Region  Moderate depletion  Upper Arm Region  Severe depletion  Thoracic and Lumbar Region  Moderate depletion  Buccal Region  Mild depletion  Temple Region  Mild depletion  Clavicle Bone Region  Moderate depletion  Clavicle and Acromion Bone Region  Moderate depletion  Scapular Bone Region  Moderate depletion  Dorsal Hand  Severe depletion  Patellar Region  Severe depletion  Anterior Thigh Region  Severe depletion  Posterior Calf Region  Severe depletion  Edema (RD Assessment)  None  Hair  Reviewed  Eyes  Reviewed  Mouth  Reviewed  Skin  Reviewed  Nails  Reviewed       Diet Order:   Diet Order           Diet heart healthy/carb modified Room service appropriate? Yes; Fluid consistency: Thin  Diet effective now          EDUCATION NEEDS:   Not appropriate for education at this time  Skin:  Skin Assessment: Skin Integrity Issues: Skin Integrity Issues:: Incisions Incisions: open abdomen  Last BM:  PTA  Height:   Ht Readings from Last 1 Encounters:  04/03/18 5\' 7"  (1.702 m)    Weight:   Wt Readings from Last 1 Encounters:  04/03/18 136 lb 14.5 oz (62.1 kg)  Ideal Body Weight:  67.3 kg  BMI:  Body mass index is 21.44 kg/m.  Estimated Nutritional Needs:   Kcal:  1700-1900  Protein:  85-100 grams  Fluid:  1.7-1.9 L    Sesilia Poucher A. Jimmye Norman, RD, LDN, CDE Pager: [phone] After hours Pager: [phone]

## 2018-04-03 NOTE — Progress Notes (Signed)
Pt. Does not have an appetite. Encouraged pt. To try to eat dinner bc pt. Didn't eat lunch today either.

## 2018-04-03 NOTE — Progress Notes (Addendum)
  Subjective: Patient examined, CT chest images personally reviewed Upper abdominal wound is much worse since office visit last month  with mucoid drainage Erythema and subcutaneous fluid at the lower sternal xiphoid region The patient previously was treated with antibiotics for MSSA of the left upper quadrant old AICD pocket-hematoma.  The patient has an original epicardial AICD patch with leads tunneled to the left upper quadrant which are still in place.  There is probably recurrent  infection in this tract which will need to be opened and debrided.  Is much hardware that can be safely removed will help control infection Eliquis has been stopped.  Vancomycin has been started. Patient will be scheduled for superficial surgical debridement of the infected areas under general anesthesia on May 16 after Eliquis washout.  The patient is too frail to survive  third time  sternotomy and complete removal of epicardial AICD patches.  He will need chronic suppressive antibiotic therapy going forward. Objective: Vital signs in last 24 hours: Temp:  [97.6 F (36.4 C)-98.4 F (36.9 C)] 97.6 F (36.4 C) (05/14 1700) Pulse Rate:  [62-101] 62 (05/14 1714) Cardiac Rhythm: Atrial fibrillation (05/14 1605) Resp:  [17-24] 17 (05/14 1714) BP: (108-158)/(49-104) 138/51 (05/14 1714) SpO2:  [81 %-100 %] 100 % (05/14 1714) Weight:  [136 lb 14.5 oz (62.1 kg)-145 lb (65.8 kg)] 136 lb 14.5 oz (62.1 kg) (05/14 0011)  Hemodynamic parameters for last 24 hours:    Intake/Output from previous day: 05/13 0701 - 05/14 0700 In: 250 [IV Piggyback:250] Out: 200 [Urine:200] Intake/Output this shift: Total I/O In: -  Out: 400 [Urine:400]  Elderly frail chronic appearing male no acute distress Breath sounds equal Heart rhythm regular Old sternal incision with erythema and subcutaneous fluid at the distal xiphoid area Left upper quadrant with indurated tissue and mucoid drainage Minimal peripheral edema No focal  neuro deficit  Lab Results: Recent Labs    04/02/18 1916 04/03/18 0039  WBC 15.3* 13.3*  HGB 10.1* 9.5*  HCT 32.4* 30.3*  PLT 272 260   BMET:  Recent Labs    04/02/18 1916  NA 133*  K 4.1  CL 87*  CO2 33*  GLUCOSE 176*  BUN 22*  CREATININE 1.16  CALCIUM 8.9    PT/INR:  Recent Labs    04/03/18 0039  LABPROT 25.8*  INR 2.38   ABG    Component Value Date/Time   PHART 7.475 (H) 12/06/2017 1730   HCO3 27.4 12/06/2017 1730   TCO2 32 03/20/2015 1638   O2SAT 98.1 12/06/2017 1730   CBG (last 3)  Recent Labs    04/03/18 0840 04/03/18 1231 04/03/18 1648  GLUCAP 143* 120* 162*    Assessment/Plan: S/P  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  The 2 areas need to be opened and debrided and further removal of AICD leads if possible. Surgery scheduled for May 16.  LOS: 1 day    Victor [name] 04/03/2018

## 2018-04-04 ENCOUNTER — Inpatient Hospital Stay (HOSPITAL_COMMUNITY): Payer: Medicare Other

## 2018-04-04 DIAGNOSIS — G4733 Obstructive sleep apnea (adult) (pediatric): Secondary | ICD-10-CM

## 2018-04-04 DIAGNOSIS — Z8619 Personal history of other infectious and parasitic diseases: Secondary | ICD-10-CM

## 2018-04-04 DIAGNOSIS — Z8673 Personal history of transient ischemic attack (TIA), and cerebral infarction without residual deficits: Secondary | ICD-10-CM

## 2018-04-04 DIAGNOSIS — I255 Ischemic cardiomyopathy: Secondary | ICD-10-CM

## 2018-04-04 DIAGNOSIS — I5022 Chronic systolic (congestive) heart failure: Secondary | ICD-10-CM

## 2018-04-04 DIAGNOSIS — L02213 Cutaneous abscess of chest wall: Secondary | ICD-10-CM

## 2018-04-04 DIAGNOSIS — Z9981 Dependence on supplemental oxygen: Secondary | ICD-10-CM

## 2018-04-04 DIAGNOSIS — E43 Unspecified severe protein-calorie malnutrition: Secondary | ICD-10-CM

## 2018-04-04 DIAGNOSIS — Z9889 Other specified postprocedural states: Secondary | ICD-10-CM

## 2018-04-04 DIAGNOSIS — E1122 Type 2 diabetes mellitus with diabetic chronic kidney disease: Secondary | ICD-10-CM

## 2018-04-04 DIAGNOSIS — Z8249 Family history of ischemic heart disease and other diseases of the circulatory system: Secondary | ICD-10-CM

## 2018-04-04 DIAGNOSIS — E785 Hyperlipidemia, unspecified: Secondary | ICD-10-CM

## 2018-04-04 DIAGNOSIS — L02211 Cutaneous abscess of abdominal wall: Secondary | ICD-10-CM

## 2018-04-04 DIAGNOSIS — F329 Major depressive disorder, single episode, unspecified: Secondary | ICD-10-CM

## 2018-04-04 DIAGNOSIS — Z95810 Presence of automatic (implantable) cardiac defibrillator: Secondary | ICD-10-CM

## 2018-04-04 DIAGNOSIS — J449 Chronic obstructive pulmonary disease, unspecified: Secondary | ICD-10-CM

## 2018-04-04 DIAGNOSIS — E1151 Type 2 diabetes mellitus with diabetic peripheral angiopathy without gangrene: Secondary | ICD-10-CM

## 2018-04-04 DIAGNOSIS — K219 Gastro-esophageal reflux disease without esophagitis: Secondary | ICD-10-CM

## 2018-04-04 DIAGNOSIS — Z888 Allergy status to other drugs, medicaments and biological substances status: Secondary | ICD-10-CM

## 2018-04-04 DIAGNOSIS — I130 Hypertensive heart and chronic kidney disease with heart failure and stage 1 through stage 4 chronic kidney disease, or unspecified chronic kidney disease: Secondary | ICD-10-CM

## 2018-04-04 DIAGNOSIS — T827XXA Infection and inflammatory reaction due to other cardiac and vascular devices, implants and grafts, initial encounter: Principal | ICD-10-CM

## 2018-04-04 DIAGNOSIS — Z862 Personal history of diseases of the blood and blood-forming organs and certain disorders involving the immune mechanism: Secondary | ICD-10-CM

## 2018-04-04 DIAGNOSIS — Z7901 Long term (current) use of anticoagulants: Secondary | ICD-10-CM

## 2018-04-04 DIAGNOSIS — Z951 Presence of aortocoronary bypass graft: Secondary | ICD-10-CM

## 2018-04-04 DIAGNOSIS — I482 Chronic atrial fibrillation: Secondary | ICD-10-CM

## 2018-04-04 DIAGNOSIS — Z87891 Personal history of nicotine dependence: Secondary | ICD-10-CM

## 2018-04-04 DIAGNOSIS — I2510 Atherosclerotic heart disease of native coronary artery without angina pectoris: Secondary | ICD-10-CM

## 2018-04-04 DIAGNOSIS — N183 Chronic kidney disease, stage 3 (moderate): Secondary | ICD-10-CM

## 2018-04-04 LAB — BASIC METABOLIC PANEL
Anion gap: 10 (ref 5–15)
BUN: 33 mg/dL — ABNORMAL HIGH (ref 6–20)
CO2: 35 mmol/L — ABNORMAL HIGH (ref 22–32)
Calcium: 8.7 mg/dL — ABNORMAL LOW (ref 8.9–10.3)
Chloride: 89 mmol/L — ABNORMAL LOW (ref 101–111)
Creatinine, Ser: 2.41 mg/dL — ABNORMAL HIGH (ref 0.61–1.24)
GFR calc Af Amer: 28 mL/min — ABNORMAL LOW (ref >60)
GFR calc non Af Amer: 24 mL/min — ABNORMAL LOW (ref >60)
Glucose, Bld: 194 mg/dL — ABNORMAL HIGH (ref 65–99)
Potassium: 4.8 mmol/L (ref 3.5–5.1)
Sodium: 134 mmol/L — ABNORMAL LOW (ref 135–145)

## 2018-04-04 LAB — COMPREHENSIVE METABOLIC PANEL
ALT: 21 U/L (ref 17–63)
AST: 35 U/L (ref 15–41)
Albumin: 2.9 g/dL — ABNORMAL LOW (ref 3.5–5.0)
Alkaline Phosphatase: 121 U/L (ref 38–126)
Anion gap: 14 (ref 5–15)
BUN: 35 mg/dL — ABNORMAL HIGH (ref 6–20)
CO2: 30 mmol/L (ref 22–32)
Calcium: 8.6 mg/dL — ABNORMAL LOW (ref 8.9–10.3)
Chloride: 91 mmol/L — ABNORMAL LOW (ref 101–111)
Creatinine, Ser: 2.37 mg/dL — ABNORMAL HIGH (ref 0.61–1.24)
GFR calc Af Amer: 28 mL/min — ABNORMAL LOW (ref >60)
GFR calc non Af Amer: 24 mL/min — ABNORMAL LOW (ref >60)
Glucose, Bld: 154 mg/dL — ABNORMAL HIGH (ref 65–99)
Potassium: 3.8 mmol/L (ref 3.5–5.1)
Sodium: 135 mmol/L (ref 135–145)
Total Bilirubin: 0.8 mg/dL (ref 0.3–1.2)
Total Protein: 7.1 g/dL (ref 6.5–8.1)

## 2018-04-04 LAB — CBC
HCT: 27.4 % — ABNORMAL LOW (ref 39.0–52.0)
HCT: 30.1 % — ABNORMAL LOW (ref 39.0–52.0)
Hemoglobin: 8.4 g/dL — ABNORMAL LOW (ref 13.0–17.0)
Hemoglobin: 9.4 g/dL — ABNORMAL LOW (ref 13.0–17.0)
MCH: 24.9 pg — ABNORMAL LOW (ref 26.0–34.0)
MCH: 25.2 pg — ABNORMAL LOW (ref 26.0–34.0)
MCHC: 30.7 g/dL (ref 30.0–36.0)
MCHC: 31.2 g/dL (ref 30.0–36.0)
MCV: 81.1 fL (ref 78.0–100.0)
MCV: 80.7 fL (ref 78.0–100.0)
Platelets: 220 10*3/uL (ref 150–400)
Platelets: 224 10*3/uL (ref 150–400)
RBC: 3.38 MIL/uL — ABNORMAL LOW (ref 4.22–5.81)
RBC: 3.73 MIL/uL — ABNORMAL LOW (ref 4.22–5.81)
RDW: 14.8 % (ref 11.5–15.5)
RDW: 14.7 % (ref 11.5–15.5)
WBC: 9.9 10*3/uL (ref 4.0–10.5)
WBC: 11.2 10*3/uL — ABNORMAL HIGH (ref 4.0–10.5)

## 2018-04-04 LAB — GLUCOSE, CAPILLARY
Glucose-Capillary: 146 mg/dL — ABNORMAL HIGH (ref 65–99)
Glucose-Capillary: 149 mg/dL — ABNORMAL HIGH (ref 65–99)
Glucose-Capillary: 157 mg/dL — ABNORMAL HIGH (ref 65–99)
Glucose-Capillary: 194 mg/dL — ABNORMAL HIGH (ref 65–99)

## 2018-04-04 LAB — PROTIME-INR
INR: 2.18
Prothrombin Time: 24.1 seconds — ABNORMAL HIGH (ref 11.4–15.2)

## 2018-04-04 LAB — PREPARE RBC (CROSSMATCH)

## 2018-04-04 LAB — APTT: aPTT: 46 seconds — ABNORMAL HIGH (ref 24–36)

## 2018-04-04 MED ORDER — CEFAZOLIN SODIUM-DEXTROSE 1-4 GM/50ML-% IV SOLN
1.0000 g | Freq: Two times a day (BID) | INTRAVENOUS | Status: DC
  Administered 2018-04-04: 1 g via INTRAVENOUS
  Filled 2018-04-04 (×3): qty 50

## 2018-04-04 MED ORDER — CEFAZOLIN SODIUM-DEXTROSE 2-4 GM/100ML-% IV SOLN
2.0000 g | INTRAVENOUS | Status: AC
  Administered 2018-04-05: 2 g via INTRAVENOUS
  Filled 2018-04-04: qty 100

## 2018-04-04 MED ORDER — VANCOMYCIN HCL 1000 MG IV SOLR
Freq: Once | INTRAVENOUS | Status: AC | PRN
  Administered 2018-04-05: 1000 mL
  Filled 2018-04-04: qty 1000

## 2018-04-04 MED ORDER — SODIUM CHLORIDE 0.9 % IV SOLN
INTRAVENOUS | Status: AC
  Administered 2018-04-04: 06:00:00 via INTRAVENOUS

## 2018-04-04 MED ORDER — CEFAZOLIN SODIUM-DEXTROSE 1-4 GM/50ML-% IV SOLN
1.0000 g | Freq: Two times a day (BID) | INTRAVENOUS | Status: DC
  Administered 2018-04-05 – 2018-04-10 (×10): 1 g via INTRAVENOUS
  Filled 2018-04-04 (×11): qty 50

## 2018-04-04 NOTE — Consult Note (Signed)
[facility] for Infectious Disease    Date of Admission:  04/02/2018     Total days of antibiotics 2  Day 1 Ancef  Day 2 Rifampin                Reason for Consult: MSSA generator pocket infection.      Referring Provider: Loleta Books Primary Care Provider: Cyndi Bender, PA-C   Assessment: 82 y.o. male admitted with worsening abdominal drainage, sternal erythema and malaise. History of multiple debridements to this site in the past and yielded MSSA in the past now with recurrent drainage to the left upper quadrant site. Lower 1/3rd of sternal incision with erythema/swelling. Not felt to be a candidate for redo-sternotomy for removal of remaining AICD components. Will plan on treatment and long-term suppression with renally adjusted keflex. Not likely he will tolerate rifampin long.    Of note - he was previously on long-term Apixaban prior to admission and now on rifampin for MSSA infection of old AICD patches. He will need warfarin if it is decided to keep rifampin longer-term.     Plan: 1. Continue cefazolin  2. Continue rifampin for now  3. Monitor LFTs on rifampin  4. Appreciate Dr. Prescott Gum help with debridement.   Principal Problem:   Sepsis ([place]) Active Problems:   Type II diabetes mellitus with renal manifestations (HCC)   Automatic implantable cardioverter-defibrillator in situ   Chronic systolic CHF (congestive heart failure) (HCC)   Essential hypertension   GERD (gastroesophageal reflux disease)   Hx of CABG 1990   Stage 3 chronic kidney disease (HCC)   COPD (chronic obstructive pulmonary disease) (HCC)   Abscess of abdominal wall   Abscess of chest wall   Atrial fibrillation, chronic (HCC)   HLD (hyperlipidemia)   Stroke (HCC)   CAD (coronary artery disease)   Fall   Protein-calorie malnutrition, severe   . amiodarone  200 mg Oral Daily  . bisoprolol  2.5 mg Oral Daily  . calcium-vitamin D  1 tablet Oral QHS  . docusate sodium  100 mg Oral  QHS  . DULoxetine  30 mg Oral Daily  . feeding supplement (ENSURE ENLIVE)  237 mL Oral TID BM  . ferrous sulfate  325 mg Oral BID WC  . insulin aspart  0-9 Units Subcutaneous TID WC  . insulin detemir  7 Units Subcutaneous BH-q7a  . multivitamin with minerals  1 tablet Oral Daily  . pantoprazole  40 mg Oral Daily  . polyethylene glycol  17 g Oral Daily  . pravastatin  40 mg Oral q1800  . rifampin  300 mg Oral Q12H  . senna-docusate  1 tablet Oral BID  . vitamin B-12  100 mcg Oral Daily    HPI: Victor [name] is a 82 y.o. male with past medical history anemia, Afib on eliquis, ischemic cardiomyopathy s/p AICD placement, CKD-3, COPD on 3L O2 via [place], CAD, s/p CABG, HTN, HLD, PVD, DM2, stroke, gerd, depression, CHF with EF 30%, OSA not on CPAP below but significant for left rectus sheath abdominal wall MSSA abscess 12-2017 requiring debridement; likely this was due to old epicardial leads. He received 2 weeks of cefazolin >> keflex for this infection.   He presented to ED here at [facility] on 04/02/18 with LUQ abdominal pain, erythema to the surgical site and sternal area for 5 days that has been getting worse. Also some associated subjective f/c. Was started on 1 week of Doxycycline 03/28/18.  CT of the chest/pelvis with fluid collection to xiphoid process measuring 2.5 cm in diameter. Fluid at the left anterior abdominal wall including around the previous anterior pericardial/epicardial leads, some of which have been removed. This area measures approximately 2.8 x 8.4 x 8.3 cm.   Dr. Prescott Gum has seen the patient and does not feel he would survive third sternotomy for removal of epicardial patches. He is scheduled for local debridement on May 16th.   Review of Systems: Review of Systems  Constitutional: Positive for chills and malaise/fatigue. Negative for fever.  HENT: Negative for tinnitus.   Eyes: Negative for blurred vision and photophobia.  Respiratory: Negative for cough and sputum  production.   Cardiovascular: Negative for chest pain.  Gastrointestinal: Negative for diarrhea, nausea and vomiting.       Upper abdominal/chest pain/drainage.   Genitourinary: Negative for dysuria.  Musculoskeletal: Negative for myalgias.  Skin: Negative for rash.  Neurological: Negative for headaches.    Past Medical History:  Diagnosis Date  . Anemia   . Arthritis   . Atrial fibrillation, persistent ([place]) 12/15/2017  . Cardiomyopathy, ischemic    a. s/p ICD placement  . Chronic combined systolic and diastolic CHF (congestive heart failure) ([place])   . CKD (chronic kidney disease), stage III ([place])   . COPD (chronic obstructive pulmonary disease) ([place])   . Coronary artery disease    a. s/p CABG 1990. b. multiple PCIs since that time including CTO angioplasty (without stenting) PCI of RCA 09/2014, with last cath 02/2015 done for worsening CHF with unsuccessful attempt at PCI of the RCA due to inability to cross occlusion - medical therapy advised at that time.  . Debilitated 12/15/2017  . GERD (gastroesophageal reflux disease)   . HLD (hyperlipidemia)   . Hypertension   . Occlusion and stenosis of carotid artery without mention of cerebral infarction   . On home oxygen therapy    "2L; 24/7" (11/08/2017)  . Orthostatic hypotension   . Paroxysmal ventricular tachycardia ([place])   . Peripheral vascular disease ([place])   . Raynaud's syndrome   . Stroke [facility])    a. CT head 06/2015: Small old lacunar infarct LEFT basal ganglia.  . Thrombocytopenia ([place])   . TIA (transient ischemic attack)    a. s/p LHC on 10/01/14   . Type II diabetes mellitus (HCC)     Social History   Tobacco Use  . Smoking status: Former Smoker    Years: 3.00    Types: Pipe  . Smokeless tobacco: Never Used  . Tobacco comment: "quit smoking pipe in early 1970's"  Substance Use Topics  . Alcohol use: Yes    Comment: 11/08/2017 "wine w/dinner maybe once/yr; or less"  . Drug use: No    Family History  Problem  Relation Age of Onset  . Heart failure Mother 50  . Heart attack Father   . Leukemia Sister   . Stomach cancer Maternal Grandfather    Allergies  Allergen Reactions  . Coreg [Carvedilol] Other (See Comments)    Shortness of breath ,fatigue ,dizzyness   . Lovastatin Other (See Comments)    CK elevation, Myalgias  . Ativan [Lorazepam] Other (See Comments)    Pt. had side effects    OBJECTIVE: Blood pressure 135/78, pulse 65, temperature 97.9 F (36.6 C), temperature source Oral, resp. rate 18, height 5\' 7"  (1.702 m), weight 137 lb 14.4 oz (62.6 kg), SpO2 100 %.  Physical Exam  Constitutional: He is oriented to person, place, and time.  Resting in bed. Uncomfortable.   HENT:  Mouth/Throat: No oral lesions. Normal dentition. No dental caries. No oropharyngeal exudate.  Eyes: Pupils are equal, round, and reactive to light. No scleral icterus.  Cardiovascular: Normal rate, regular rhythm and intact distal pulses.  Pulmonary/Chest: Effort normal and breath sounds normal. No respiratory distress.  Abdominal: Soft. Bowel sounds are normal. He exhibits no distension. There is no tenderness.  Musculoskeletal:  Old sternal incision well healed but with ~4cm circular area of erythema and swelling to lower 1/3rd of site.  LUQ Abdomen with sero-purulent drainage through previous open incision.   Lymphadenopathy:    He has no cervical adenopathy.  Neurological: He is alert and oriented to person, place, and time.  Skin: Skin is warm and dry. No rash noted.  Vitals reviewed.  Lab Results Lab Results  Component Value Date   WBC 11.2 (H) 04/04/2018   HGB 9.4 (L) 04/04/2018   HCT 30.1 (L) 04/04/2018   MCV 80.7 04/04/2018   PLT 224 04/04/2018    Lab Results  Component Value Date   CREATININE 2.41 (H) 04/04/2018   BUN 33 (H) 04/04/2018   NA 134 (L) 04/04/2018   K 4.8 04/04/2018   CL 89 (L) 04/04/2018   CO2 35 (H) 04/04/2018    Lab Results  Component Value Date   ALT 16 (L)  04/02/2018   AST 23 04/02/2018   ALKPHOS 129 (H) 04/02/2018   BILITOT 0.6 04/02/2018     Microbiology: Recent Results (from the past 240 hour(s))  Culture, blood (Routine X 2) w Reflex to ID Panel     Status: None (Preliminary result)   Collection Time: 04/02/18  7:19 PM  Result Value Ref Range Status   Specimen Description BLOOD RIGHT ANTECUBITAL  Final   Special Requests   Final    BOTTLES DRAWN AEROBIC AND ANAEROBIC Blood Culture adequate volume   Culture   Final    NO GROWTH < 24 HOURS Performed at [facility], [address] [place], [place] [address]    Report Status PENDING  Incomplete  MRSA PCR Screening     Status: None   Collection Time: 04/03/18 12:42 AM  Result Value Ref Range Status   MRSA by PCR NEGATIVE NEGATIVE Final    Comment:        The GeneXpert MRSA Assay (FDA approved for NASAL specimens only), is one component of a comprehensive MRSA colonization surveillance program. It is not intended to diagnose MRSA infection nor to guide or monitor treatment for MRSA infections. Performed at [facility], [place] [address] [place], [place] [address]     Janene Madeira, MSN, NP-C [facility] [place] Cell: [phone] Pager: [phone]  04/04/2018 10:25 AM

## 2018-04-04 NOTE — Progress Notes (Signed)
Pharmacy Antibiotic Note  Victor [name] is a 82 y.o. male admitted on 04/02/2018 with wound infection. 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   Pharmacy has been consulted to change to ancef. Afebrile, wbc 10. Scr still elevated at 2.4.   Plan: Cefazolin 1g IV q12 hours Monitor clinical progress, c/s, renal function   Height: 5\' 7"  (170.2 cm) Weight: 137 lb 14.4 oz (62.6 kg) IBW/kg (Calculated) : 66.1  Temp (24hrs), Avg:97.9 F (36.6 C), Min:97.6 F (36.4 C), Max:98.4 F (36.9 C)  Recent Labs  Lab 04/02/18 1916 04/02/18 1928 04/03/18 0039 04/04/18 0223  WBC 15.3*  --  13.3* 9.9  CREATININE 1.16  --   --  2.41*  LATICACIDVEN  --  1.84 1.8  --     Estimated Creatinine Clearance: 21.6 mL/min (A) (by C-G formula based on SCr of 2.41 mg/dL (H)).    Allergies  Allergen Reactions  . Coreg [Carvedilol] Other (See Comments)    Shortness of breath ,fatigue ,dizzyness   . Lovastatin Other (See Comments)    CK elevation, Myalgias  . Ativan [Lorazepam] Other (See Comments)    Pt. had side effects   Erin Hearing PharmD., BCPS Clinical Pharmacist 04/04/2018 7:55 AM

## 2018-04-04 NOTE — Care Management Note (Signed)
Case Management Note  Patient Details  Name: MARKAIL DIEKMAN MRN: [id number] Date of Birth: 1935-12-18  Subjective/Objective:    Pt admitted with sternal area wound - pt is s/p CABG                 Action/Plan:  PTA independent from home.  Plan is for pt to have I&D 5/17.  Pt may discharge with a wound vac - CM will continue to follow for discharge needs   Expected Discharge Date:                  Expected Discharge Plan:  [place]  In-House Referral:     Discharge planning Services  CM Consult  Post Acute Care Choice:    Choice offered to:     DME Arranged:    DME Agency:     HH Arranged:    [place] Agency:     Status of Service:     If discussed at [employer], dates discussed:    Additional Comments:  Maryclare Labrador, RN 04/04/2018, 4:53 PM

## 2018-04-04 NOTE — Anesthesia Preprocedure Evaluation (Addendum)
Anesthesia Evaluation    Reviewed: Allergy & Precautions, Patient's Chart, lab work & pertinent test results  Airway Mallampati: II       Dental   Pulmonary sleep apnea , COPD,  COPD inhaler and oxygen dependent, former smoker,    breath sounds clear to auscultation       Cardiovascular hypertension, + CAD, + Past MI and +CHF  + Cardiac Defibrillator  Rhythm:Irregular Rate:Normal  Study Conclusions  - Left ventricle: Systolic function was moderately to severely   reduced. The estimated ejection fraction was in the range of 30%   to 35%. Wall motion was normal; there were no regional wall   motion abnormalities. - Mitral valve: Mildly calcified annulus. Mildly thickened leaflets   . There was mild to moderate central regurgitation secondary to   mitral annular dilatation from LV dysfuction   Neuro/Psych TIA   GI/Hepatic Neg liver ROS, GERD  ,  Endo/Other  diabetes  Renal/GU Renal disease     Musculoskeletal   Abdominal   Peds  Hematology   Anesthesia Other Findings   Reproductive/Obstetrics                             Anesthesia Physical  Anesthesia Plan  ASA: III  Anesthesia Plan: General   Post-op Pain Management:    Induction: Intravenous  PONV Risk Score and Plan: 2 and Ondansetron and Diphenhydramine  Airway Management Planned: LMA  Additional Equipment:   Intra-op Plan:   Post-operative Plan:   Informed Consent: I have reviewed the patients History and Physical, chart, labs and discussed the procedure including the risks, benefits and alternatives for the proposed anesthesia with the patient or authorized representative who has indicated his/her understanding and acceptance.   Dental advisory given  Plan Discussed with: CRNA and Anesthesiologist  Anesthesia Plan Comments: ( )        Anesthesia Quick Evaluation

## 2018-04-04 NOTE — Evaluation (Signed)
Physical Therapy Evaluation Patient Details Name: Victor [name] MRN: [id number] DOB: [birth year] Today's Date: 04/04/2018   History of Present Illness   Victor [name] is a 82 y.o. male with medical history significant of anemia, Afib on eliquis, ischemic cardiomyopathy s/p AICD placement, CKD-3, COPD on 3L O2 via [place], CAD, s/p CABG, HTN, HLD, PVD, DM2, stroke, gerd, depression, CHF with EF 30%, OSA not on CPAP, who presents with pain, erythema in LUQ abdominal surgical site and sternal area. Pt found to have abdominal wall absess  Clinical Impression  Pt functioning near baseline. With limited activity at home due to quick onset of fatigue due to COPD and EF of 30% with CHF. Pt would benefit from home health visit to optimize home set up and work on energy conservation techniques in the home as pt completes all ADLs and IADLS. Acute PT to follow.    Follow Up Recommendations Home health PT;Supervision - Intermittent    Equipment Recommendations  None recommended by PT    Recommendations for Other Services       Precautions / Restrictions Precautions Precautions: Fall Precaution Comments: L UQ wound Restrictions Weight Bearing Restrictions: No      Mobility  Bed Mobility Overal bed mobility: Modified Independent             General bed mobility comments: hob flat, used bed rails to pull self up  Transfers Overall transfer level: Needs assistance Equipment used: None Transfers: Sit to/from Stand Sit to Stand: Supervision         General transfer comment: pt with good technique, slow/guarded, no instability, held onto bed rail upon standing  Ambulation/Gait Ambulation/Gait assistance: Supervision Ambulation Distance (Feet): 80 Feet Assistive device: Rolling walker (2 wheeled) Gait Pattern/deviations: Step-through pattern;Decreased stride length;Trunk flexed;Shuffle Gait velocity: slow Gait velocity interpretation: <1.31 ft/sec, indicative of household  ambulator General Gait Details: pt slow and guarded. pt reports "i don't walk much at home" pt unable to maintain trunk extension and [name] up. pt c/o bilat UE numbness during amb. Pt con't to have c/o neck pain as well and unable to hold it up for duration of walk  Stairs            Wheelchair Mobility    Modified Rankin (Stroke Patients Only)       Balance Overall balance assessment: Needs assistance Sitting-balance support: Feet supported;No upper extremity supported Sitting balance-Leahy Scale: Good     Standing balance support: Single extremity supported;During functional activity Standing balance-Leahy Scale: Poor Standing balance comment: pt held onto bed rail to stand and urinate                             Pertinent Vitals/Pain Pain Assessment: Faces Faces Pain Scale: Hurts whole lot Pain Location: L UQ and to left of sternum Pain Descriptors / Indicators: Sharp Pain Intervention(s): Monitored during session    Home Living Family/patient expects to be discharged to:: Private residence Living Arrangements: Alone Available Help at Discharge: Family;Friend(s);Neighbor;Available PRN/intermittently Type of Home: House Home Access: Stairs to enter Entrance Stairs-Rails: Psychiatric nurse of Steps: 3 Home Layout: One level Home Equipment: Walker - 2 wheels;Shower seat;Toilet riser;Hand held shower [name] Additional Comments: per patient grandson does the grocery shopping and manages his medications, comes by 1x/wk and on saturday. Pt reports his neighbors take care of everything outside. the only time he goes outside is for MD appts.     Prior Function Level of  Independence: Independent with assistive device(s)         Comments: uses 2WW, only amb inside home, doesn't go into community. mail is sent to grandson's address. grandson does grocery shopping. pt reports "i was my own floors, do my own laundry, make my own food until i stopped  being hungry a couple weeks ago"     Hand Dominance   Dominant Hand: Right    Extremity/Trunk Assessment   Upper Extremity Assessment Upper Extremity Assessment: Generalized weakness(pt reports bilat UE numbness during amb)    Lower Extremity Assessment Lower Extremity Assessment: Generalized weakness    Cervical / Trunk Assessment Cervical / Trunk Assessment: Kyphotic(forward [name])  Communication   Communication: No difficulties  Cognition Arousal/Alertness: Awake/alert Behavior During Therapy: Agitated(with "being tied down" refering to lines and low bed) Overall Cognitive Status: No family/caregiver present to determine baseline cognitive functioning                                 General Comments: appears pt is stubborn at baseline and can be impulsive. pt able to hold appropriate conversation but become irritated when discussing family      General Comments      Exercises     Assessment/Plan    PT Assessment Patient needs continued PT services  PT Problem List Decreased strength;Decreased balance;Decreased activity tolerance;Decreased mobility;Cardiopulmonary status limiting activity       PT Treatment Interventions DME instruction;Gait training;Stair training;Functional mobility training;Therapeutic activities;Therapeutic exercise;Balance training    PT Goals (Current goals can be found in the Care Plan section)  Acute Rehab PT Goals Patient Stated Goal: home asap PT Goal Formulation: With patient Time For Goal Achievement: 04/18/18 Potential to Achieve Goals: Good    Frequency Min 3X/week   Barriers to discharge Decreased caregiver support doesn't have 24/7    Co-evaluation               AM-PAC PT "6 Clicks" Daily Activity  Outcome Measure Difficulty turning over in bed (including adjusting bedclothes, sheets and blankets)?: A Little Difficulty moving from lying on back to sitting on the side of the bed? : A Little Difficulty  sitting down on and standing up from a chair with arms (e.g., wheelchair, bedside commode, etc,.)?: A Little Help needed moving to and from a bed to chair (including a wheelchair)?: A Little Help needed walking in hospital room?: A Little Help needed climbing 3-5 steps with a railing? : A Little 6 Click Score: 18    End of Session Equipment Utilized During Treatment: Gait belt;Oxygen(3LO2 via [place]) Activity Tolerance: Patient tolerated treatment well Patient left: in chair;with call bell/phone within reach Nurse Communication: Mobility status(pt draining blood from IV site) PT Visit Diagnosis: Unsteadiness on feet (R26.81)    Time: 0721-0802 PT Time Calculation (min) (ACUTE ONLY): 41 min   Charges:   PT Evaluation $PT Eval Moderate Complexity: 1 Mod PT Treatments $Gait Training: 8-22 mins   PT G Codes:        Kittie Plater, PT, DPT Pager #: [phone] Office #: [phone]   [place] 04/04/2018, 8:18 AM

## 2018-04-04 NOTE — Progress Notes (Addendum)
Another RT attempted x2 and obtained venous.   1548: Attempted to page Dr. Prescott Gum.

## 2018-04-04 NOTE — Progress Notes (Signed)
2 RT's attempted ABG at this time and unable to obtain. RT had to place patient back on oxygen due to Spo2 drop to 90%. Will try again at a later time for ABG.

## 2018-04-04 NOTE — Progress Notes (Signed)
PROGRESS NOTE    Victor [name]  IOE:[id number] DOB: 07-Jun-1936 DOA: 04/02/2018 PCP: Cyndi Bender, PA-C      Brief Narrative:  Victor [name] is a 82 y.o. M with COPD on 3L home O2, CAD s/p CABG, CHF EF 30% with ICD, HTN, PVD, DM, stroke, and COPD not on CPAP who presents with sternal redness for 4 days.   Assessment & Plan:  Sternal abscess Abdomen wall abscess Without sepsis.  The patient has no evidence of end organ damage or septic physiology.  He is comfortable, has had mild redness and pain of the sternum and a 2.5cm fluid collection there on CT.  He also has large fluid collection in the abdomen wall.  No fever, chills.  He is just tired, and anorexic. -Change vancomycin to cefazolin given kidney injury -Continue rifampin -Consult infectious disease re: current Abx regimen and recommendations regarding long-term suppressive therapy as recommended by CT surgery -Consult to CT surgery  Acute on chronic kidney disease stage III Baseline creatinine 1.0-1.2.  DOubled today. -Hold diuretics -Check urine studies -Stop vancomycin  Acute on chronic systolic and diastolic CHF Pleural effusions on CT chest, orthopnea, dyspnea on exertion.  BNP elevated.   AKI with diuresis yesteray. -Hold diuretics -Gentle IVF -Strict ins and outs, daily weights  Type 2 diabetes -Continue home Levemir and SSI  Hypertension Coronary artery disease History of stroke -Continue BB, statin  COPD Chronic respiratory failure 3 L home O2 No active disease.  Atrial fibrillation, permanent Chads 2 vascular 8.  On Eliquis at home, currently held. -Continue bisoprolol and amiodarone -Hold Eliquis, washout and then surgical debridement per CT surgery.   -CHADS2Vasc high, resume Eliquis post-surgery as soon as safe per Surgery   Other medications -Continue duloxetine  Anemia Unclear etiology -Continue iron, B12 supplements        DVT prophylaxis: SCDs Code Status: DO NOT  RESUSICTATE Family Communication: None present MDM and disposition Plan: The above labs and imaging reports were rviewed and summarized above.  His status is clinically worsening with new renal failure, doubling of creatinine. Above workup initiated.    He was admitted with redness of the chest, found to have fluid collection narrowing in his anterior abdomen, presumed to be abscess.  CT surgery has been following the patient and will surgically debride soon.  ID are consulted for antibiotic sterwardship.   Consultants:   CT surgery  ID  Procedures:   None  Antimicrobials:   Vancomycin 5/13 >> 5/15  Rifampin 5/14 >>  Cefazolin 5/13 >>  Culture data: BCx x2 5/13: NGTD    Subjective: Very tired today.  Weak all over.  No change to his anterior chest, no worsening around his left lower abdomen.  Anorexic.  No swelling, orthopnea.  No dyspnea, chest pain.  No palpitations.     Objective: Vitals:   04/03/18 1935 04/03/18 2330 04/04/18 0323 04/04/18 0700  BP: (!) 124/59 133/63 (!) 129/59 135/78  Pulse: 61 67 65 65  Resp: 20 19 18 18   Temp: 97.8 F (36.6 C) 97.8 F (36.6 C) 97.8 F (36.6 C) 97.9 F (36.6 C)  TempSrc: Oral Oral Oral Oral  SpO2: 100% 100% 99% 100%  Weight:   62.6 kg (137 lb 14.4 oz)   Height:        Intake/Output Summary (Last 24 hours) at 04/04/2018 1027 Last data filed at 04/03/2018 2122 Gross per 24 hour  Intake 240 ml  Output 500 ml  Net -260 ml   Filed  Weights   04/02/18 1829 04/03/18 0011 04/04/18 0323  Weight: 65.8 kg (145 lb) 62.1 kg (136 lb 14.5 oz) 62.6 kg (137 lb 14.4 oz)    Examination: General appearance: Thin elderly frail male, sleeping, easily roused.  No acute distress.  Tired. HEENT: Anicteric, conjunctiva pink, lids and lashes normal.  OP dry, greenish crust on posterior tongue, no other oral lesions.  Hearing normal.   Skin: Redness on sternum unchanged.  Unchanged left lower abdomen wound, no redness fluctuance, inudration  around it.  Skin otherwise warm and dry.    Cardiac: RRR, no murmurs, flat JVP with lying, no LE edema. Respiratory: Normal respiratory effort, no rales or wheezs. Abdomen: Abdomen soft, tender in lower left, mild, no rigidity, no distension, ascites. MSK: No deformities or effusions. Neuro: Sleeping, rouses easily.  Oriented.  Weak globally. Psych: Oriented to person, place and time.  Attention tired.  Normal affect.    Data Reviewed: I have personally reviewed following labs and imaging studies:  CBC: Recent Labs  Lab 04/02/18 1916 04/03/18 0039 04/04/18 0223 04/04/18 0847  WBC 15.3* 13.3* 9.9 11.2*  NEUTROABS 12.9*  --   --   --   HGB 10.1* 9.5* 8.4* 9.4*  HCT 32.4* 30.3* 27.4* 30.1*  MCV 81.0 80.6 81.1 80.7  PLT 272 260 220 409   Basic Metabolic Panel: Recent Labs  Lab 04/02/18 1916 04/04/18 0223  NA 133* 134*  K 4.1 4.8  CL 87* 89*  CO2 33* 35*  GLUCOSE 176* 194*  BUN 22* 33*  CREATININE 1.16 2.41*  CALCIUM 8.9 8.7*   GFR: Estimated Creatinine Clearance: 21.6 mL/min (A) (by C-G formula based on SCr of 2.41 mg/dL (H)). Liver Function Tests: Recent Labs  Lab 04/02/18 1916  AST 23  ALT 16*  ALKPHOS 129*  BILITOT 0.6  PROT 7.7  ALBUMIN 3.1*   No results for input(s): LIPASE, AMYLASE in the last 168 hours. No results for input(s): AMMONIA in the last 168 hours. Coagulation Profile: Recent Labs  Lab 04/03/18 0039 04/04/18 0223  INR 2.38 2.18   Cardiac Enzymes: No results for input(s): CKTOTAL, CKMB, CKMBINDEX, TROPONINI in the last 168 hours. BNP (last 3 results) No results for input(s): PROBNP in the last 8760 hours. HbA1C: No results for input(s): HGBA1C in the last 72 hours. CBG: Recent Labs  Lab 04/03/18 0840 04/03/18 1231 04/03/18 1648 04/03/18 2106 04/04/18 0813  GLUCAP 143* 120* 162* 199* 146*   Lipid Profile: No results for input(s): CHOL, HDL, LDLCALC, TRIG, CHOLHDL, LDLDIRECT in the last 72 hours. Thyroid Function Tests: No  results for input(s): TSH, T4TOTAL, FREET4, T3FREE, THYROIDAB in the last 72 hours. Anemia Panel: No results for input(s): VITAMINB12, FOLATE, FERRITIN, TIBC, IRON, RETICCTPCT in the last 72 hours. Urine analysis:    Component Value Date/Time   COLORURINE YELLOW 04/03/2018 1824   APPEARANCEUR HAZY (A) 04/03/2018 1824   LABSPEC 1.026 04/03/2018 1824   PHURINE 5.0 04/03/2018 1824   GLUCOSEU NEGATIVE 04/03/2018 1824   HGBUR NEGATIVE 04/03/2018 1824   BILIRUBINUR NEGATIVE 04/03/2018 1824   KETONESUR NEGATIVE 04/03/2018 1824   PROTEINUR NEGATIVE 04/03/2018 1824   NITRITE NEGATIVE 04/03/2018 1824   LEUKOCYTESUR NEGATIVE 04/03/2018 1824   Sepsis Labs: @LABRCNTIP (procalcitonin:4,lacticacidven:4)  ) Recent Results (from the past 240 hour(s))  Culture, blood (Routine X 2) w Reflex to ID Panel     Status: None (Preliminary result)   Collection Time: 04/02/18  7:19 PM  Result Value Ref Range Status   Specimen Description BLOOD  RIGHT ANTECUBITAL  Final   Special Requests   Final    BOTTLES DRAWN AEROBIC AND ANAEROBIC Blood Culture adequate volume   Culture   Final    NO GROWTH < 24 HOURS Performed at [facility], [address], [place], [place] [address]    Report Status PENDING  Incomplete  MRSA PCR Screening     Status: None   Collection Time: 04/03/18 12:42 AM  Result Value Ref Range Status   MRSA by PCR NEGATIVE NEGATIVE Final    Comment:        The GeneXpert MRSA Assay (FDA approved for NASAL specimens only), is one component of a comprehensive MRSA colonization surveillance program. It is not intended to diagnose MRSA infection nor to guide or monitor treatment for MRSA infections. Performed at [facility], [place] [address], [place], [place] [address]          Radiology Studies: Ct Head Wo Contrast  Result Date: 04/03/2018 CLINICAL DATA:  82 year old male fell 1 week ago hurting left shoulder. Bilateral neck pain. Denies injury to head or loss of  consciousness. Initial encounter. EXAM: CT HEAD WITHOUT CONTRAST CT CERVICAL SPINE WITHOUT CONTRAST TECHNIQUE: Multidetector CT imaging of the head and cervical spine was performed following the standard protocol without intravenous contrast. Multiplanar CT image reconstructions of the cervical spine were also generated. COMPARISON:  07/16/2017 head CT.  10/23/2015 cervical spine CT. FINDINGS: CT HEAD FINDINGS Brain: No intracranial hemorrhage or CT evidence of large acute infarct. Chronic microvascular changes. Mild global atrophy. Left parietal 5 mm dural-based calcification may represent small meningioma without associated vasogenic edema and unchanged. Vascular: Vascular calcifications Skull: No skull fracture Sinuses/Orbits: No acute orbital abnormality. Visualized paranasal sinuses are clear. Other: Mastoid air cells and middle ear cavities are clear. CT CERVICAL SPINE FINDINGS Alignment: Alignment similar to prior cervical spine CT. Minimal anterior slip C3 and C4. Skull base and vertebrae: No cervical spine fracture. Soft tissues and spinal canal: No abnormal prevertebral soft tissue swelling. Disc levels: Transverse ligament hypertrophy. Multilevel cervical spondylotic changes C3-4 through C6-7 have progressed slightly since prior exam. Upper chest: Bilateral pleural effusions layering posteriorly. Other: Prominent caries. Pacemaker in place. Prominent carotid bifurcation calcifications. IMPRESSION: CT HEAD: No skull fracture or intracranial hemorrhage. Chronic microvascular changes. Mild global atrophy. Left parietal 5 mm dural-based calcification may represent small meningioma without associated vasogenic edema and unchanged. CT CERVICAL SPINE: Alignment similar to prior cervical spine CT. Minimal anterior slip C3 and C4. No cervical spine fracture or abnormal prevertebral soft tissue swelling. Multilevel cervical spondylotic changes C3-4 through C6-7 have progressed slightly since prior exam. Bilateral  pleural effusions layering posteriorly. Prominent carotid bifurcation calcifications. Electronically Signed   By: Genia Del M.D.   On: 04/03/2018 10:12   Ct Chest W Contrast  Result Date: 04/02/2018 CLINICAL DATA:  Recent surgery to remove infected wires from previous AICD. Also history of anal fistula repair. EXAM: CT CHEST, ABDOMEN, AND PELVIS WITH CONTRAST TECHNIQUE: Multidetector CT imaging of the chest, abdomen and pelvis was performed following the standard protocol during bolus administration of intravenous contrast. CONTRAST:  147mL OMNIPAQUE IOHEXOL 300 MG/ML  SOLN COMPARISON:  CT abdomen 12/26/2017.  CT chest 12/02/2017. FINDINGS: CT CHEST FINDINGS Cardiovascular: Aortic atherosclerosis. Previous median sternotomy and CABG. Coronary artery calcification. Cardiomegaly. Pacemaker/AICD in place. Pericardial device on the left. Pericardial device anteriorly. Mediastinum/Nodes: Slight enlargement of AP window nodes compared to the previous exam, presumably reactive. Lungs/Pleura: Small effusions layering dependently. Dependent pulmonary atelectasis  and or pneumonia, most pronounced in the left lower lobe. Non dependent lungs are clear. Musculoskeletal: Small fluid density area anterior to the xiphoid process measures about 2.5 cm in diameter and could possibly represent a superficial abscess. CT ABDOMEN PELVIS FINDINGS Hepatobiliary: Liver parenchyma appears normal. Calcified gallstones as seen previously. Pancreas: Atrophic change.  No focal finding. Spleen: Normal Adrenals/Urinary Tract: Adrenal glands are normal. Kidneys are normal except for a few benign cysts. Bladder is normal. Stomach/Bowel: No acute bowel finding. Vascular/Lymphatic: Aortic atherosclerosis. Branch vessel atherosclerosis. No aneurysm. No retroperitoneal adenopathy. Reproductive: Normal Other: No free fluid or air. Musculoskeletal: Soft tissue swelling of the left anterior abdominal wall region including the rectus muscle and  overlying sheath, surrounding leads, some of which have been removed since the study of February. The area of concern measures up to 2.8 cm in thickness and 8.4 cm right to left and extends over a length of about 8.3 cm. This may simply be postoperative sequela, but ongoing infection is not excluded. Ordinary degenerative changes affect the spine. IMPRESSION: Bilateral pleural effusions layering dependently with dependent atelectasis and or pneumonia, left lower lobe predominant. Enlargement of AP window lymph nodes, presumably reactive. Fluid collection anterior to the xiphoid process measuring up to 2.5 cm in diameter. This could be infected material. Fluid at the left anterior abdominal wall including around the previous anterior pericardial/epicardial leads, some of which have been removed. This area measures approximately 2.8 x 8.4 x 8.3 cm. This could simply be postoperative swelling and edema, but the possibility of ongoing or recurrent infection is not excluded. The appearance is better than on the study of 12/26/2017. Electronically Signed   By: Nelson Chimes M.D.   On: 04/02/2018 22:03   Ct Cervical Spine Wo Contrast  Result Date: 04/03/2018 CLINICAL DATA:  82 year old male fell 1 week ago hurting left shoulder. Bilateral neck pain. Denies injury to head or loss of consciousness. Initial encounter. EXAM: CT HEAD WITHOUT CONTRAST CT CERVICAL SPINE WITHOUT CONTRAST TECHNIQUE: Multidetector CT imaging of the head and cervical spine was performed following the standard protocol without intravenous contrast. Multiplanar CT image reconstructions of the cervical spine were also generated. COMPARISON:  07/16/2017 head CT.  10/23/2015 cervical spine CT. FINDINGS: CT HEAD FINDINGS Brain: No intracranial hemorrhage or CT evidence of large acute infarct. Chronic microvascular changes. Mild global atrophy. Left parietal 5 mm dural-based calcification may represent small meningioma without associated vasogenic edema  and unchanged. Vascular: Vascular calcifications Skull: No skull fracture Sinuses/Orbits: No acute orbital abnormality. Visualized paranasal sinuses are clear. Other: Mastoid air cells and middle ear cavities are clear. CT CERVICAL SPINE FINDINGS Alignment: Alignment similar to prior cervical spine CT. Minimal anterior slip C3 and C4. Skull base and vertebrae: No cervical spine fracture. Soft tissues and spinal canal: No abnormal prevertebral soft tissue swelling. Disc levels: Transverse ligament hypertrophy. Multilevel cervical spondylotic changes C3-4 through C6-7 have progressed slightly since prior exam. Upper chest: Bilateral pleural effusions layering posteriorly. Other: Prominent caries. Pacemaker in place. Prominent carotid bifurcation calcifications. IMPRESSION: CT HEAD: No skull fracture or intracranial hemorrhage. Chronic microvascular changes. Mild global atrophy. Left parietal 5 mm dural-based calcification may represent small meningioma without associated vasogenic edema and unchanged. CT CERVICAL SPINE: Alignment similar to prior cervical spine CT. Minimal anterior slip C3 and C4. No cervical spine fracture or abnormal prevertebral soft tissue swelling. Multilevel cervical spondylotic changes C3-4 through C6-7 have progressed slightly since prior exam. Bilateral pleural effusions layering posteriorly. Prominent carotid bifurcation calcifications. Electronically Signed  By: Genia Del M.D.   On: 04/03/2018 10:12   Ct Abdomen Pelvis W Contrast  Result Date: 04/02/2018 CLINICAL DATA:  Recent surgery to remove infected wires from previous AICD. Also history of anal fistula repair. EXAM: CT CHEST, ABDOMEN, AND PELVIS WITH CONTRAST TECHNIQUE: Multidetector CT imaging of the chest, abdomen and pelvis was performed following the standard protocol during bolus administration of intravenous contrast. CONTRAST:  133mL OMNIPAQUE IOHEXOL 300 MG/ML  SOLN COMPARISON:  CT abdomen 12/26/2017.  CT chest  12/02/2017. FINDINGS: CT CHEST FINDINGS Cardiovascular: Aortic atherosclerosis. Previous median sternotomy and CABG. Coronary artery calcification. Cardiomegaly. Pacemaker/AICD in place. Pericardial device on the left. Pericardial device anteriorly. Mediastinum/Nodes: Slight enlargement of AP window nodes compared to the previous exam, presumably reactive. Lungs/Pleura: Small effusions layering dependently. Dependent pulmonary atelectasis and or pneumonia, most pronounced in the left lower lobe. Non dependent lungs are clear. Musculoskeletal: Small fluid density area anterior to the xiphoid process measures about 2.5 cm in diameter and could possibly represent a superficial abscess. CT ABDOMEN PELVIS FINDINGS Hepatobiliary: Liver parenchyma appears normal. Calcified gallstones as seen previously. Pancreas: Atrophic change.  No focal finding. Spleen: Normal Adrenals/Urinary Tract: Adrenal glands are normal. Kidneys are normal except for a few benign cysts. Bladder is normal. Stomach/Bowel: No acute bowel finding. Vascular/Lymphatic: Aortic atherosclerosis. Branch vessel atherosclerosis. No aneurysm. No retroperitoneal adenopathy. Reproductive: Normal Other: No free fluid or air. Musculoskeletal: Soft tissue swelling of the left anterior abdominal wall region including the rectus muscle and overlying sheath, surrounding leads, some of which have been removed since the study of February. The area of concern measures up to 2.8 cm in thickness and 8.4 cm right to left and extends over a length of about 8.3 cm. This may simply be postoperative sequela, but ongoing infection is not excluded. Ordinary degenerative changes affect the spine. IMPRESSION: Bilateral pleural effusions layering dependently with dependent atelectasis and or pneumonia, left lower lobe predominant. Enlargement of AP window lymph nodes, presumably reactive. Fluid collection anterior to the xiphoid process measuring up to 2.5 cm in diameter. This could  be infected material. Fluid at the left anterior abdominal wall including around the previous anterior pericardial/epicardial leads, some of which have been removed. This area measures approximately 2.8 x 8.4 x 8.3 cm. This could simply be postoperative swelling and edema, but the possibility of ongoing or recurrent infection is not excluded. The appearance is better than on the study of 12/26/2017. Electronically Signed   By: Nelson Chimes M.D.   On: 04/02/2018 22:03   Dg Shoulder Left  Result Date: 04/03/2018 CLINICAL DATA:  Left shoulder pain after fall 2 weeks ago. EXAM: LEFT SHOULDER - 2+ VIEW COMPARISON:  Radiographs of February 17, 2006. FINDINGS: There is no evidence of fracture or dislocation. Mild degenerative changes seen involving the left acromioclavicular joint. Soft tissues are unremarkable. IMPRESSION: Mild degenerative joint disease of left acromioclavicular joint. No acute abnormality seen in the left shoulder. Electronically Signed   By: Marijo Conception, M.D.   On: 04/03/2018 09:27        Scheduled Meds: . amiodarone  200 mg Oral Daily  . bisoprolol  2.5 mg Oral Daily  . calcium-vitamin D  1 tablet Oral QHS  . docusate sodium  100 mg Oral QHS  . DULoxetine  30 mg Oral Daily  . feeding supplement (ENSURE ENLIVE)  237 mL Oral TID BM  . ferrous sulfate  325 mg Oral BID WC  . insulin aspart  0-9 Units Subcutaneous TID  WC  . insulin detemir  7 Units Subcutaneous [id number]  . multivitamin with minerals  1 tablet Oral Daily  . pantoprazole  40 mg Oral Daily  . polyethylene glycol  17 g Oral Daily  . pravastatin  40 mg Oral q1800  . rifampin  300 mg Oral Q12H  . senna-docusate  1 tablet Oral BID  . vitamin B-12  100 mcg Oral Daily   Continuous Infusions: . sodium chloride 100 mL/hr at 04/04/18 0626  .  ceFAZolin (ANCEF) IV    . [START ON 04/05/2018]  ceFAZolin (ANCEF) IV       LOS: 2 days    Time spent: 25 minutes    Edwin Dada, MD [facility] 04/04/2018, 10:27 AM     Pager [phone] --- please page though AMION:  [email or web address] If 7PM-7AM, please contact night-coverage

## 2018-04-05 ENCOUNTER — Inpatient Hospital Stay (HOSPITAL_COMMUNITY): Payer: Medicare Other | Admitting: Certified Registered"

## 2018-04-05 ENCOUNTER — Encounter (HOSPITAL_COMMUNITY): Payer: Self-pay | Admitting: Certified Registered"

## 2018-04-05 ENCOUNTER — Inpatient Hospital Stay (HOSPITAL_COMMUNITY): Payer: Medicare Other

## 2018-04-05 ENCOUNTER — Encounter (HOSPITAL_COMMUNITY)
Admission: EM | Disposition: A | Discharge status: Nursing Home (Includes ICF) | Payer: Self-pay | Source: Home / Self Care | Attending: Internal Medicine

## 2018-04-05 DIAGNOSIS — K651 Peritoneal abscess: Secondary | ICD-10-CM

## 2018-04-05 DIAGNOSIS — R011 Cardiac murmur, unspecified: Secondary | ICD-10-CM

## 2018-04-05 DIAGNOSIS — M6008 Infective myositis, other site: Secondary | ICD-10-CM

## 2018-04-05 DIAGNOSIS — T827XXA Infection and inflammatory reaction due to other cardiac and vascular devices, implants and grafts, initial encounter: Secondary | ICD-10-CM

## 2018-04-05 DIAGNOSIS — K041 Necrosis of pulp: Secondary | ICD-10-CM

## 2018-04-05 DIAGNOSIS — Z978 Presence of other specified devices: Secondary | ICD-10-CM

## 2018-04-05 DIAGNOSIS — K029 Dental caries, unspecified: Secondary | ICD-10-CM

## 2018-04-05 DIAGNOSIS — R188 Other ascites: Secondary | ICD-10-CM

## 2018-04-05 DIAGNOSIS — B9561 Methicillin susceptible Staphylococcus aureus infection as the cause of diseases classified elsewhere: Secondary | ICD-10-CM

## 2018-04-05 HISTORY — PX: WOUND DEBRIDEMENT: SHX247

## 2018-04-05 HISTORY — PX: APPLICATION OF WOUND VAC: SHX5189

## 2018-04-05 LAB — BASIC METABOLIC PANEL
Anion gap: 14 (ref 5–15)
BUN: 40 mg/dL — ABNORMAL HIGH (ref 6–20)
CO2: 30 mmol/L (ref 22–32)
Calcium: 8.3 mg/dL — ABNORMAL LOW (ref 8.9–10.3)
Chloride: 90 mmol/L — ABNORMAL LOW (ref 101–111)
Creatinine, Ser: 3.34 mg/dL — ABNORMAL HIGH (ref 0.61–1.24)
GFR calc Af Amer: 19 mL/min — ABNORMAL LOW (ref >60)
GFR calc non Af Amer: 16 mL/min — ABNORMAL LOW (ref >60)
Glucose, Bld: 162 mg/dL — ABNORMAL HIGH (ref 65–99)
Potassium: 4.1 mmol/L (ref 3.5–5.1)
Sodium: 134 mmol/L — ABNORMAL LOW (ref 135–145)

## 2018-04-05 LAB — GLUCOSE, CAPILLARY
GLUCOSE-CAPILLARY: 146 mg/dL — AB (ref 65–99)
GLUCOSE-CAPILLARY: 177 mg/dL — AB (ref 65–99)
Glucose-Capillary: 177 mg/dL — ABNORMAL HIGH (ref 65–99)
Glucose-Capillary: 172 mg/dL — ABNORMAL HIGH (ref 65–99)
Glucose-Capillary: 168 mg/dL — ABNORMAL HIGH (ref 65–99)
Glucose-Capillary: 148 mg/dL — ABNORMAL HIGH (ref 65–99)

## 2018-04-05 SURGERY — DEBRIDEMENT, WOUND
Anesthesia: General

## 2018-04-05 MED ORDER — FENTANYL CITRATE (PF) 250 MCG/5ML IJ SOLN
INTRAMUSCULAR | Status: AC
Start: 2018-04-05 — End: ?
  Filled 2018-04-05: qty 5

## 2018-04-05 MED ORDER — LIDOCAINE 2% (20 MG/ML) 5 ML SYRINGE
INTRAMUSCULAR | Status: DC | PRN
  Administered 2018-04-05: 100 mg via INTRAVENOUS

## 2018-04-05 MED ORDER — SODIUM CHLORIDE 0.9 % IJ SOLN
INTRAMUSCULAR | Status: AC
  Filled 2018-04-05: qty 10

## 2018-04-05 MED ORDER — LIDOCAINE 2% (20 MG/ML) 5 ML SYRINGE
INTRAMUSCULAR | Status: AC
  Filled 2018-04-05: qty 5

## 2018-04-05 MED ORDER — DIPHENHYDRAMINE HCL 50 MG/ML IJ SOLN
INTRAMUSCULAR | Status: AC
  Filled 2018-04-05: qty 1

## 2018-04-05 MED ORDER — FENTANYL CITRATE (PF) 100 MCG/2ML IJ SOLN
25.0000 ug | INTRAMUSCULAR | Status: DC | PRN
  Administered 2018-04-05 (×2): 25 ug via INTRAVENOUS

## 2018-04-05 MED ORDER — DIPHENHYDRAMINE HCL 50 MG/ML IJ SOLN: INTRAMUSCULAR | Status: DC | PRN

## 2018-04-05 MED ORDER — FENTANYL CITRATE (PF) 100 MCG/2ML IJ SOLN
INTRAMUSCULAR | Status: AC
  Administered 2018-04-05: 25 ug via INTRAVENOUS
  Filled 2018-04-05: qty 2

## 2018-04-05 MED ORDER — SODIUM CHLORIDE 0.9 % IR SOLN
Status: DC | PRN
  Administered 2018-04-05: 3000 mL

## 2018-04-05 MED ORDER — PROMETHAZINE HCL 25 MG/ML IJ SOLN: 6.2500 mg | INTRAMUSCULAR | Status: DC | PRN

## 2018-04-05 MED ORDER — PHENYLEPHRINE HCL 10 MG/ML IJ SOLN
INTRAMUSCULAR | Status: DC | PRN
  Administered 2018-04-05: 25 ug/min via INTRAVENOUS

## 2018-04-05 MED ORDER — ACETAMINOPHEN 160 MG/5ML PO SOLN: 1000.0000 mg | Freq: Four times a day (QID) | ORAL | Status: AC

## 2018-04-05 MED ORDER — DIPHENHYDRAMINE HCL 50 MG/ML IJ SOLN
INTRAMUSCULAR | Status: DC | PRN
  Administered 2018-04-05: 12.5 mg via INTRAVENOUS

## 2018-04-05 MED ORDER — PROPOFOL 10 MG/ML IV BOLUS
INTRAVENOUS | Status: AC
  Filled 2018-04-05: qty 20

## 2018-04-05 MED ORDER — PHENYLEPHRINE 40 MCG/ML (10ML) SYRINGE FOR IV PUSH (FOR BLOOD PRESSURE SUPPORT)
PREFILLED_SYRINGE | INTRAVENOUS | Status: DC | PRN
  Administered 2018-04-05 (×2): 120 ug via INTRAVENOUS

## 2018-04-05 MED ORDER — BISACODYL 5 MG PO TBEC
10.0000 mg | DELAYED_RELEASE_TABLET | Freq: Every day | ORAL | Status: DC
  Administered 2018-04-05 – 2018-04-11 (×7): 10 mg via ORAL
  Filled 2018-04-05 (×8): qty 2

## 2018-04-05 MED ORDER — MORPHINE SULFATE (PF) 2 MG/ML IV SOLN
2.0000 mg | INTRAVENOUS | Status: AC | PRN
  Administered 2018-04-06 (×2): 2 mg via INTRAVENOUS
  Filled 2018-04-05 (×2): qty 1

## 2018-04-05 MED ORDER — LACTATED RINGERS IV SOLN
INTRAVENOUS | Status: DC | PRN
  Administered 2018-04-05: 07:00:00 via INTRAVENOUS

## 2018-04-05 MED ORDER — PROPOFOL 10 MG/ML IV BOLUS
INTRAVENOUS | Status: DC | PRN
  Administered 2018-04-05: 130 mg via INTRAVENOUS

## 2018-04-05 MED ORDER — FENTANYL CITRATE (PF) 250 MCG/5ML IJ SOLN
INTRAMUSCULAR | Status: DC | PRN
  Administered 2018-04-05 (×2): 50 ug via INTRAVENOUS

## 2018-04-05 MED ORDER — ACETAMINOPHEN 500 MG PO TABS
1000.0000 mg | ORAL_TABLET | Freq: Four times a day (QID) | ORAL | Status: AC
  Administered 2018-04-05 – 2018-04-10 (×9): 1000 mg via ORAL
  Filled 2018-04-05 (×14): qty 2

## 2018-04-05 MED ORDER — SODIUM CHLORIDE 0.45 % IV SOLN
INTRAVENOUS | Status: DC
  Administered 2018-04-05: 13:00:00 via INTRAVENOUS

## 2018-04-05 MED ORDER — EPHEDRINE SULFATE 50 MG/ML IJ SOLN
INTRAMUSCULAR | Status: AC
  Filled 2018-04-05: qty 1

## 2018-04-05 MED ORDER — ONDANSETRON HCL 4 MG/2ML IJ SOLN
INTRAMUSCULAR | Status: AC
Start: 2018-04-05 — End: ?
  Filled 2018-04-05: qty 2

## 2018-04-05 MED ORDER — 0.9 % SODIUM CHLORIDE (POUR BTL) OPTIME
TOPICAL | Status: DC | PRN
  Administered 2018-04-05: 1000 mL

## 2018-04-05 MED ORDER — ONDANSETRON HCL 4 MG/2ML IJ SOLN
INTRAMUSCULAR | Status: DC | PRN
  Administered 2018-04-05: 4 mg via INTRAVENOUS

## 2018-04-05 SURGICAL SUPPLY — 53 items
APL SKNCLS STERI-STRIP NONHPOA (GAUZE/BANDAGES/DRESSINGS)
BENZOIN TINCTURE PRP APPL 2/3 (GAUZE/BANDAGES/DRESSINGS) IMPLANT
BLADE SURG 10 STRL SS (BLADE) IMPLANT
BLADE SURG 15 STRL LF DISP TIS (BLADE) IMPLANT
BLADE SURG 15 STRL SS (BLADE)
BNDG GAUZE ELAST 4 BULKY (GAUZE/BANDAGES/DRESSINGS) IMPLANT
CANISTER SUCT 3000ML PPV (MISCELLANEOUS) ×3 IMPLANT
CLIP VESOCCLUDE SM WIDE 24/CT (CLIP) IMPLANT
CONNECTOR Y ATS VAC SYSTEM (MISCELLANEOUS) ×2 IMPLANT
CONT SPEC 4OZ CLIKSEAL STRL BL (MISCELLANEOUS) ×2 IMPLANT
COVER SURGICAL LIGHT HANDLE (MISCELLANEOUS) ×4 IMPLANT
DRAPE LAPAROSCOPIC ABDOMINAL (DRAPES) ×3 IMPLANT
DRAPE SLUSH/WARMER DISC (DRAPES) ×2 IMPLANT
DRSG PAD ABDOMINAL 8X10 ST (GAUZE/BANDAGES/DRESSINGS) IMPLANT
DRSG VAC ATS LRG SENSATRAC (GAUZE/BANDAGES/DRESSINGS) ×1 IMPLANT
DRSG VAC ATS MED SENSATRAC (GAUZE/BANDAGES/DRESSINGS) ×1 IMPLANT
DRSG VAC ATS SM SENSATRAC (GAUZE/BANDAGES/DRESSINGS) ×5 IMPLANT
ELECT REM PT RETURN 9FT ADLT (ELECTROSURGICAL) ×3
ELECTRODE REM PT RTRN 9FT ADLT (ELECTROSURGICAL) ×1 IMPLANT
GAUZE SPONGE 4X4 12PLY STRL (GAUZE/BANDAGES/DRESSINGS) IMPLANT
GAUZE XEROFORM 5X9 LF (GAUZE/BANDAGES/DRESSINGS) IMPLANT
GLOVE BIO SURGEON STRL SZ7.5 (GLOVE) ×6 IMPLANT
GLOVE BIOGEL M 6.5 STRL (GLOVE) ×2 IMPLANT
GLOVE BIOGEL PI IND STRL 6.5 (GLOVE) IMPLANT
GLOVE BIOGEL PI IND STRL 7.0 (GLOVE) IMPLANT
GLOVE BIOGEL PI INDICATOR 6.5 (GLOVE) ×2
GLOVE BIOGEL PI INDICATOR 7.0 (GLOVE) ×4
GOWN STRL REUS W/ TWL LRG LVL3 (GOWN DISPOSABLE) ×2 IMPLANT
GOWN STRL REUS W/TWL LRG LVL3 (GOWN DISPOSABLE) ×9
HANDPIECE INTERPULSE COAX TIP (DISPOSABLE) ×3
HEMOSTAT POWDER SURGIFOAM 1G (HEMOSTASIS) IMPLANT
HEMOSTAT SURGICEL 2X14 (HEMOSTASIS) IMPLANT
KIT BASIN OR (CUSTOM PROCEDURE TRAY) ×3 IMPLANT
KIT SUCTION CATH 14FR (SUCTIONS) IMPLANT
KIT TURNOVER KIT B (KITS) ×3 IMPLANT
NS IRRIG 1000ML POUR BTL (IV SOLUTION) ×5 IMPLANT
PACK GENERAL/GYN (CUSTOM PROCEDURE TRAY) ×3 IMPLANT
PAD ARMBOARD 7.5X6 YLW CONV (MISCELLANEOUS) ×6 IMPLANT
SET HNDPC FAN SPRY TIP SCT (DISPOSABLE) ×1 IMPLANT
STAPLER VISISTAT 35W (STAPLE) IMPLANT
STRAP MONTGOMERY 1.25X11-1/8 (MISCELLANEOUS) IMPLANT
SUT ETHILON 3 0 FSL (SUTURE) IMPLANT
SUT VIC AB 1 CTX 36 (SUTURE)
SUT VIC AB 1 CTX36XBRD ANBCTR (SUTURE) IMPLANT
SUT VIC AB 2-0 CTX 27 (SUTURE) IMPLANT
SUT VIC AB 3-0 X1 27 (SUTURE) IMPLANT
SWAB COLLECTION DEVICE MRSA (MISCELLANEOUS) ×2 IMPLANT
SWAB CULTURE ESWAB REG 1ML (MISCELLANEOUS) ×2 IMPLANT
TOWEL GREEN STERILE (TOWEL DISPOSABLE) ×3 IMPLANT
TOWEL GREEN STERILE FF (TOWEL DISPOSABLE) ×3 IMPLANT
TRAY FOLEY MTR SLVR 16FR STAT (SET/KITS/TRAYS/PACK) IMPLANT
WATER STERILE IRR 1000ML POUR (IV SOLUTION) ×3 IMPLANT
WND VAC CANISTER 500ML (MISCELLANEOUS) ×5 IMPLANT

## 2018-04-05 NOTE — Progress Notes (Signed)
Pre Procedure note for inpatients:   Victor [name] has been scheduled for Procedure(s): SUPERFICIAL STERNAL AND ABDOMINAL WOUND DEBRIDEMENT (N/A) APPLICATION OF WOUND VAC (N/A) today. The various methods of treatment have been discussed with the patient. After consideration of the risks, benefits and treatment options the patient has consented to the planned procedure.   The patient has been seen and labs reviewed. There are no changes in the patient's condition to prevent proceeding with the planned procedure today.  Recent labs:  Lab Results  Component Value Date   WBC 11.2 (H) 04/04/2018   HGB 9.4 (L) 04/04/2018   HCT 30.1 (L) 04/04/2018   PLT 224 04/04/2018   GLUCOSE 162 (H) 04/05/2018   CHOL 127 06/29/2015   TRIG 111 06/29/2015   HDL 36 (L) 06/29/2015   LDLCALC 69 06/29/2015   ALT 21 04/04/2018   AST 35 04/04/2018   NA 134 (L) 04/05/2018   K 4.1 04/05/2018   CL 90 (L) 04/05/2018   CREATININE 3.34 (H) 04/05/2018   BUN 40 (H) 04/05/2018   CO2 30 04/05/2018   TSH 0.411 12/07/2017   INR 2.18 04/04/2018   HGBA1C 8.6 (H) 12/26/2017    Len Childs, MD 04/05/2018 7:11 AM

## 2018-04-05 NOTE — Progress Notes (Signed)
PROGRESS NOTE    Victor [name]  OMB:[id number] DOB: Nov 25, 1935 DOA: 04/02/2018 PCP: Cyndi Bender, PA-C Brief Narrative:  82 y.o. M with COPD on 3L home O2, CAD s/p CABG, CHF EF 30% with ICD, HTN, PVD, DM, stroke, and COPD not on CPAP who presents with sternal redness for 4 days.   Assessment & Plan:   Principal Problem:   Abdominal wall fluid collections Active Problems:   Type II diabetes mellitus with renal manifestations (HCC)   Automatic implantable cardioverter-defibrillator in situ   Chronic systolic CHF (congestive heart failure) (HCC)   Essential hypertension   GERD (gastroesophageal reflux disease)   Hx of CABG 1990   Stage 3 chronic kidney disease (HCC)   COPD (chronic obstructive pulmonary disease) (HCC)   Abscess of chest wall   Sepsis (HCC)   Atrial fibrillation, chronic (HCC)   HLD (hyperlipidemia)   Stroke ([place])   CAD (coronary artery disease)   Fall   Protein-calorie malnutrition, severe  Sternal abscess Abdomen wall abscess s/p WOUND VAC TIMES 2.  -Change vancomycin to cefazolin given kidney injury -Continue rifampin -ID foll.CTS foll. Acute on chronic kidney disease stage III Baseline creatinine 1.0-1.2. trippled.-Hold diuretics -Check urine studies - vancomycin stopped.  Acute on chronic systolic and diastolic CHF Pleural effusions on CT chest, orthopnea, dyspnea on exertion.  BNP elevated.   AKI with diuresis yesteray. -Hold diuretics -Gentle IVF -Strict ins and outs, daily weights  Type 2 diabetes -Continue home Levemir and SSI  Hypertension Coronary artery disease History of stroke -Continue BB, statin  COPD Chronic respiratory failure 3 L home O2 No active disease.  Atrial fibrillation, permanent Chads 2 vascular 8.  On Eliquis at home, currently held. -Continue bisoprolol and amiodarone -Hold Eliquis, washout and then surgical debridement per CT surgery.   -CHADS2Vasc high, RESUME ELIQUIS WHEN OK WITH SURGERY. Other  medications -Continue duloxetine  Anemia Unclear etiology -Continue iron, B12 supplements     DVT prophylaxis:SCD Code Status: FULL Family CommunicationNONE Disposition Plan TBD  Consultants: VASCULAR,ID  Procedures: NONE Antimicrobials:  Subjective:Resting in bed in nad.  Objective: Vitals:   04/05/18 1011 04/05/18 1015 04/05/18 1030 04/05/18 1054  BP:  (!) 169/65 (!) 169/65 (!) 153/66  Pulse: 70 68 64 67  Resp: 17 15 20  (!) 21  Temp: (!) 97 F (36.1 C)   (!) 97.5 F (36.4 C)  TempSrc:    Oral  SpO2: 98% 95% 100% 97%  Weight:      Height:        Intake/Output Summary (Last 24 hours) at 04/05/2018 1149 Last data filed at 04/05/2018 0930 Gross per 24 hour  Intake 1000 ml  Output 400 ml  Net 600 ml   Filed Weights   04/03/18 0011 04/04/18 0323 04/05/18 0333  Weight: 62.1 kg (136 lb 14.5 oz) 62.6 kg (137 lb 14.4 oz) 63.8 kg (140 lb 10.5 oz)    Examination:  General exam: Appears calm and comfortable   wound vac in place chest and abdomen. Respiratory system: Clear to auscultation. Respiratory effort normal. Cardiovascular system: S1 & S2 heard, RRR. No JVD, murmurs, rubs, gallops or clicks. No pedal edema. Gastrointestinal system: Abdomen is nondistended, soft and nontender. No organomegaly or masses felt. Normal bowel sounds heard. Central nervous system: Alert and oriented. No focal neurological deficits. Extremities: Symmetric 5 x 5 power. Skin: No rashes, lesions or ulcers Psychiatry: Judgement and insight appear normal. Mood & affect appropriate.     Data Reviewed: I have personally reviewed following  labs and imaging studies  CBC: Recent Labs  Lab 04/02/18 1916 04/03/18 0039 04/04/18 0223 04/04/18 0847  WBC 15.3* 13.3* 9.9 11.2*  NEUTROABS 12.9*  --   --   --   HGB 10.1* 9.5* 8.4* 9.4*  HCT 32.4* 30.3* 27.4* 30.1*  MCV 81.0 80.6 81.1 80.7  PLT 272 260 220 161   Basic Metabolic Panel: Recent Labs  Lab 04/02/18 1916 04/04/18 0223  04/04/18 0847 04/05/18 0339  NA 133* 134* 135 134*  K 4.1 4.8 3.8 4.1  CL 87* 89* 91* 90*  CO2 33* 35* 30 30  GLUCOSE 176* 194* 154* 162*  BUN 22* 33* 35* 40*  CREATININE 1.16 2.41* 2.37* 3.34*  CALCIUM 8.9 8.7* 8.6* 8.3*   GFR: Estimated Creatinine Clearance: 15.9 mL/min (A) (by C-G formula based on SCr of 3.34 mg/dL (H)). Liver Function Tests: Recent Labs  Lab 04/02/18 1916 04/04/18 0847  AST 23 35  ALT 16* 21  ALKPHOS 129* 121  BILITOT 0.6 0.8  PROT 7.7 7.1  ALBUMIN 3.1* 2.9*   No results for input(s): LIPASE, AMYLASE in the last 168 hours. No results for input(s): AMMONIA in the last 168 hours. Coagulation Profile: Recent Labs  Lab 04/03/18 0039 04/04/18 0223  INR 2.38 2.18   Cardiac Enzymes: No results for input(s): CKTOTAL, CKMB, CKMBINDEX, TROPONINI in the last 168 hours. BNP (last 3 results) No results for input(s): PROBNP in the last 8760 hours. HbA1C: No results for input(s): HGBA1C in the last 72 hours. CBG: Recent Labs  Lab 04/04/18 1611 04/04/18 2129 04/05/18 0537 04/05/18 1006 04/05/18 1059  GLUCAP 157* 194* 146* 177* 177*   Lipid Profile: No results for input(s): CHOL, HDL, LDLCALC, TRIG, CHOLHDL, LDLDIRECT in the last 72 hours. Thyroid Function Tests: No results for input(s): TSH, T4TOTAL, FREET4, T3FREE, THYROIDAB in the last 72 hours. Anemia Panel: No results for input(s): VITAMINB12, FOLATE, FERRITIN, TIBC, IRON, RETICCTPCT in the last 72 hours. Sepsis Labs: Recent Labs  Lab 04/02/18 1928 04/03/18 0039  PROCALCITON  --  0.28  LATICACIDVEN 1.84 1.8    Recent Results (from the past 240 hour(s))  Culture, blood (Routine X 2) w Reflex to ID Panel     Status: None (Preliminary result)   Collection Time: 04/02/18  7:19 PM  Result Value Ref Range Status   Specimen Description BLOOD RIGHT ANTECUBITAL  Final   Special Requests   Final    BOTTLES DRAWN AEROBIC AND ANAEROBIC Blood Culture adequate volume   Culture   Final    NO GROWTH  3 DAYS Performed at [facility], [place] [address] [place], [place] [address]    Report Status PENDING  Incomplete  Culture, blood (Routine X 2) w Reflex to ID Panel     Status: None (Preliminary result)   Collection Time: 04/02/18 10:30 PM  Result Value Ref Range Status   Specimen Description BLOOD LEFT ANTECUBITAL  Final   Special Requests   Final    BOTTLES DRAWN AEROBIC AND ANAEROBIC Blood Culture results may not be optimal due to an inadequate volume of blood received in culture bottles   Culture   Final    NO GROWTH 2 DAYS Performed at [facility], [place] [address], [place],  [address]    Report Status PENDING  Incomplete  MRSA PCR Screening     Status: None   Collection Time: 04/03/18 12:42 AM  Result Value Ref Range Status   MRSA by PCR NEGATIVE NEGATIVE Final  Comment:        The GeneXpert MRSA Assay (FDA approved for NASAL specimens only), is one component of a comprehensive MRSA colonization surveillance program. It is not intended to diagnose MRSA infection nor to guide or monitor treatment for MRSA infections. Performed at [facility], [place] [address], [place], [place] [address]          Radiology Studies: Dg Chest 2 View  Result Date: 04/04/2018 CLINICAL DATA:  Pre op chest exam. Pt states he has an infection at the site of his previous pacemaker. He has a new pacemaker at his left shoulder. EXAM: CHEST - 2 VIEW COMPARISON:  Chest x-ray dated 12/28/2017. FINDINGS: Stable cardiomegaly. No confluent opacity to suggest a developing pneumonia. No pleural effusion or pneumothorax seen. No acute or suspicious osseous finding. LEFT chest wall pacemaker/ICD apparatus appears stable in position with intact lead. IMPRESSION: No active cardiopulmonary disease. No evidence of pneumonia or pulmonary edema. Stable cardiomegaly. Electronically Signed   By: Franki Cabot M.D.   On: 04/04/2018 22:36   Dg Chest Port 1 View  Result Date:  04/05/2018 CLINICAL DATA:  Status post sternal and abdominal wound debridement. Shortness of breath, cough EXAM: PORTABLE CHEST 1 VIEW COMPARISON:  04/04/2018 FINDINGS: Prior CABG. Left AICD remains in place, unchanged. Cardiomegaly. Low lung volumes without confluent opacity or effusion. No pneumothorax. IMPRESSION: Low lung volumes, cardiomegaly.  No active disease. Electronically Signed   By: Rolm Baptise M.D.   On: 04/05/2018 10:33        Scheduled Meds: . acetaminophen  1,000 mg Oral Q6H   Or  . acetaminophen (TYLENOL) oral liquid 160 mg/5 mL  1,000 mg Oral Q6H  . amiodarone  200 mg Oral Daily  . bisacodyl  10 mg Oral Daily  . bisoprolol  2.5 mg Oral Daily  . calcium-vitamin D  1 tablet Oral QHS  . docusate sodium  100 mg Oral QHS  . DULoxetine  30 mg Oral Daily  . feeding supplement (ENSURE ENLIVE)  237 mL Oral TID BM  . ferrous sulfate  325 mg Oral BID WC  . insulin aspart  0-9 Units Subcutaneous TID WC  . insulin detemir  7 Units Subcutaneous BH-q7a  . multivitamin with minerals  1 tablet Oral Daily  . pantoprazole  40 mg Oral Daily  . polyethylene glycol  17 g Oral Daily  . pravastatin  40 mg Oral q1800  . rifampin  300 mg Oral Q12H  . senna-docusate  1 tablet Oral BID  . vitamin B-12  100 mcg Oral Daily   Continuous Infusions: . sodium chloride    .  ceFAZolin (ANCEF) IV       LOS: 3 days     Georgette Shell, MD [facility]  If 7PM-7AM, please contact night-coverage [email or web address] Password [facility] 04/05/2018, 11:49 AM

## 2018-04-05 NOTE — Progress Notes (Signed)
Nutrition Follow-up  DOCUMENTATION CODES:   Severe malnutrition in context of chronic illness  INTERVENTION:   -Once diet is advanced, resume:  -Ensure Enlive po TID, each supplement provides 350 kcal and 20 grams of protein -MVI daily  NUTRITION DIAGNOSIS:   Severe Malnutrition related to chronic illness(CHF) as evidenced by moderate fat depletion, severe fat depletion, moderate muscle depletion, severe muscle depletion, percent weight loss.  Ongoing  GOAL:   Patient will meet greater than or equal to 90% of their needs  Progressing  MONITOR:   PO intake, Supplement acceptance, Labs, Weight trends, Skin, I & O's  REASON FOR ASSESSMENT:   Malnutrition Screening Tool    ASSESSMENT:   Victor [name] is a 81 y.o. male with medical history significant of anemia, Afib on eliquis, ischemic cardiomyopathy s/p AICD placement, CKD-3, COPD on 3L O2 via [place], CAD, s/p CABG, HTN, HLD, PVD, DM2, stroke, gerd, depression, CHF with EF 30%, OSA not on CPAP, who presents with pain, erythema in LUQ abdominal surgical site and sternal area.  Pt down in OR at time of visit. Pt undergoing superficial sternal and abdominal wound debridement with wound vac placement.   No intake data recorded. Observed tray table- pt consuming fluids well- noted a cup of water and a bottle of gingerale. Per MAR, pt is consuming Ensure supplements and MVI.   Labs reviewed: Na: 134, CBGS: 146-194 (inpatient orders for glycemic control are 0-9 units insulin aspart TID with meals and 7 units inulin determir q AM).   Diet Order:   Diet Order           Diet NPO time specified  Diet effective midnight          EDUCATION NEEDS:   Not appropriate for education at this time  Skin:  Skin Assessment: Skin Integrity Issues: Skin Integrity Issues:: Incisions Incisions: open abdomen  Last BM:  03/31/18  Height:   Ht Readings from Last 1 Encounters:  04/03/18 5\' 7"  (1.702 m)    Weight:   Wt Readings from  Last 1 Encounters:  04/05/18 140 lb 10.5 oz (63.8 kg)    Ideal Body Weight:  67.3 kg  BMI:  Body mass index is 22.03 kg/m.  Estimated Nutritional Needs:   Kcal:  1900-2100  Protein:  95-110 grams  Fluid:  > 1.9 L    Victor [name] A. Jimmye Norman, RD, LDN, CDE Pager: [phone] After hours Pager: [phone]

## 2018-04-05 NOTE — Progress Notes (Signed)
[facility] Infusion Coordinator will follow with ID Team to support long term IV ABX if needed at DC.  If patient discharges after hours, please call [phone].   Larry Sierras 04/05/2018, 9:16 AM

## 2018-04-05 NOTE — Progress Notes (Signed)
[facility] will provide home infusion services at DC if needed. [facility] can also support HH services based on patient's [facility] of choice.  [facility] Infusion Coordinator will follow pt and support DC plan for home.  If patient discharges after hours, please call [phone].   Larry Sierras 04/05/2018, 2:29 PM

## 2018-04-05 NOTE — Care Management Note (Addendum)
Case Management Note  Patient Details  Name: Victor [name] MRN: [id number] Date of Birth: [birth year]  Subjective/Objective:    Pt admitted with sternal area wound - pt is s/p CABG                 Action/Plan:  PTA independent from home.  Pt from home alone - states neighbors are support system.  Pt has walker, cane and scooter in the home.  Pt has home oxygen 3 liter continious supplied via [facility].  Plan is for pt to have I&D 5/17.  Pt may discharge with a wound vac - CM will continue to follow for discharge needs   Expected Discharge Date:                  Expected Discharge Plan:  [place]  In-House Referral:     Discharge planning Services  CM Consult  Post Acute Care Choice:    Choice offered to:     DME Arranged:    DME Agency:     HH Arranged:    [place] Agency:     Status of Service:     If discussed at [employer], dates discussed:    Additional Comments: 04/05/2018 Pt is now s/p I&D - 2 wound vacs applied.  Pt requiring IV antibiotic - may need long term - [facility] following.  Determination has not been made regarding wound vac being needed at discharge (CM requested consult if wound vac will be necessary at discharge).  CM offered choice for [facility] including HHRN - pt will discuss with son and inform CM of choice of agency.    Maryclare Labrador, RN 04/05/2018, 2:10 PM

## 2018-04-05 NOTE — Anesthesia Procedure Notes (Signed)
Procedure Name: LMA Insertion Date/Time: 04/05/2018 7:39 AM Performed by: Barrington Ellison, CRNA Pre-anesthesia Checklist: Patient identified, Emergency Drugs available, Suction available and Patient being monitored Patient Re-evaluated:Patient Re-evaluated prior to induction Oxygen Delivery Method: Circle System Utilized Preoxygenation: Pre-oxygenation with 100% oxygen Induction Type: IV induction Ventilation: Mask ventilation without difficulty LMA: LMA inserted LMA Size: 5.0 Number of attempts: 1 Placement Confirmation: positive ETCO2 Tube secured with: Tape Dental Injury: Teeth and Oropharynx as per pre-operative assessment

## 2018-04-05 NOTE — Progress Notes (Signed)
Lab called to request urine sample; will collect once pt back on unit & able to provide sample.   [place]  Lashawne Dura, RN

## 2018-04-05 NOTE — Transfer of Care (Signed)
Immediate Anesthesia Transfer of Care Note  Patient: Victor [name]  Procedure(s) Performed: SUPERFICIAL STERNAL AND ABDOMINAL WOUND DEBRIDEMENT (N/A ) APPLICATION OF WOUND VAC (N/A )  Patient Location: PACU  Anesthesia Type:General  Level of Consciousness: awake and patient cooperative  Airway & Oxygen Therapy: Patient Spontanous Breathing and Patient connected to nasal cannula oxygen  Post-op Assessment: Report given to RN  Post vital signs: Reviewed and stable  Last Vitals:  Vitals Value Taken Time  BP 135/83 04/05/2018  9:41 AM  Temp 36.1 C 04/05/2018  9:38 AM  Pulse 67 04/05/2018  9:41 AM  Resp 18 04/05/2018  9:41 AM  SpO2 91 % 04/05/2018  9:41 AM    Last Pain:  Vitals:   04/05/18 0320  TempSrc: Oral  PainSc: 0-No pain      Patients Stated Pain Goal: 0 (35/82/51 8984)  Complications: No apparent anesthesia complications

## 2018-04-05 NOTE — Progress Notes (Addendum)
[facility] for Infectious Disease  Date of Admission:  04/02/2018   Total days of antibiotics 3         Day 2 Ancef        Day 3 Rifampin           Patient ID: Victor [name] is a 82 y.o. male with   Principal Problem:   Abdominal wall fluid collections Active Problems:   Automatic implantable cardioverter-defibrillator in situ   Abscess of chest wall   Type II diabetes mellitus with renal manifestations (HCC)   Chronic systolic CHF (congestive heart failure) (HCC)   Essential hypertension   GERD (gastroesophageal reflux disease)   Hx of CABG 1990   Stage 3 chronic kidney disease (HCC)   COPD (chronic obstructive pulmonary disease) (HCC)   Sepsis (HCC)   Atrial fibrillation, chronic (HCC)   HLD (hyperlipidemia)   Stroke (HCC)   CAD (coronary artery disease)   Fall   Protein-calorie malnutrition, severe   . amiodarone  200 mg Oral Daily  . bisoprolol  2.5 mg Oral Daily  . calcium-vitamin D  1 tablet Oral QHS  . docusate sodium  100 mg Oral QHS  . DULoxetine  30 mg Oral Daily  . feeding supplement (ENSURE ENLIVE)  237 mL Oral TID BM  . ferrous sulfate  325 mg Oral BID WC  . insulin aspart  0-9 Units Subcutaneous TID WC  . insulin detemir  7 Units Subcutaneous BH-q7a  . multivitamin with minerals  1 tablet Oral Daily  . pantoprazole  40 mg Oral Daily  . polyethylene glycol  17 g Oral Daily  . pravastatin  40 mg Oral q1800  . rifampin  300 mg Oral Q12H  . senna-docusate  1 tablet Oral BID  . vitamin B-12  100 mcg Oral Daily    SUBJECTIVE: Went to OR today for debridement of sternal and abdominal wall sites. VAC in place and functioning well with minimal sanguinous drainage. Feeling poorly today and in pain.    Allergies  Allergen Reactions  . Coreg [Carvedilol] Other (See Comments)    Shortness of breath ,fatigue ,dizzyness   . Lovastatin Other (See Comments)    CK elevation, Myalgias  . Ativan [Lorazepam] Other (See Comments)    Pt. had side  effects    OBJECTIVE: Vitals:   04/05/18 1011 04/05/18 1015 04/05/18 1030 04/05/18 1054  BP:  (!) 169/65 (!) 169/65 (!) 153/66  Pulse: 70 68 64 67  Resp: 17 15 20  (!) 21  Temp: (!) 97 F (36.1 C)   (!) 97.5 F (36.4 C)  TempSrc:    Oral  SpO2: 98% 95% 100% 97%  Weight:      Height:       Body mass index is 22.03 kg/m.  Physical Exam  Constitutional: He is oriented to person, place, and time.  Chronically ill appearing.   HENT:  Mouth/Throat: No oral lesions. Normal dentition. No dental caries.  Poor dentition with multiple carries and necrotic teeth.   Eyes: No scleral icterus.  Right sclera red.   Cardiovascular: Normal rate, regular rhythm and intact distal pulses.  Murmur heard. Pulmonary/Chest: Effort normal and breath sounds normal. No respiratory distress.  Abdominal: Soft. Bowel sounds are normal. He exhibits no distension. There is no tenderness.  Wound vac in place with good seal to abdomen and lower chest.   Lymphadenopathy:    He has no cervical adenopathy.  Neurological: He is  alert and oriented to person, place, and time.  Skin: Skin is warm and dry. Capillary refill takes less than 2 seconds. No rash noted.  Psychiatric: He has a normal mood and affect. Judgment normal.  Vitals reviewed.   Lab Results Lab Results  Component Value Date   WBC 11.2 (H) 04/04/2018   HGB 9.4 (L) 04/04/2018   HCT 30.1 (L) 04/04/2018   MCV 80.7 04/04/2018   PLT 224 04/04/2018    Lab Results  Component Value Date   CREATININE 3.34 (H) 04/05/2018   BUN 40 (H) 04/05/2018   NA 134 (L) 04/05/2018   K 4.1 04/05/2018   CL 90 (L) 04/05/2018   CO2 30 04/05/2018    Lab Results  Component Value Date   ALT 21 04/04/2018   AST 35 04/04/2018   ALKPHOS 121 04/04/2018   BILITOT 0.8 04/04/2018     Microbiology: BCx 5/13 > NG  Surgical Culture 5/16 > pending   ASSESSMENT: 82 y.o. male with history of MSSA infection of rectus sheath muscle / intraabdominal abscess at the  site of previous AICD generator. He was taken to OR for superficial clean out today by Dr. Prescott Gum. Continues on Cefazolin and RIF combo. Will clarify with surgery about removal of components or not. Will continue Rifampin for now and check hepatic studies tomorrow. Kidney function increased noted - dose currently appropriate for cefazolin but will continue to follow.   Drug interactions with amiodarone and apixaban noted. Will follow while on rifampin inpatient to determine tolerability. Would try to aim for 6 weeks.   PLAN: 1. Continue cefazolin at current dose 2. Continue Rifampin  3. Check LFTs in AM 4. Will follow op cultures   Janene Madeira, MSN, NP-C [facility] for Infectious [place] Cell: [phone] Pager: [phone]  04/05/2018  11:25 AM

## 2018-04-05 NOTE — Progress Notes (Signed)
Received report from PACU nurse.   [place]  Victor Markov, RN

## 2018-04-05 NOTE — Op Note (Signed)
NAME: Victor [name], Victor [name] MEDICAL RECORD [id number] ACCOUNT [id number] DATE OF BIRTH:03-22-1936 FACILITY: [facility] LOCATION: [facility] PHYSICIAN:PETER VAN TRIGT III, MD  OPERATIVE REPORT  DATE OF PROCEDURE:  04/05/2018  PROCEDURE PERFORMED:  Incision, drainage, excisional debridement of lower sternal and upper abdominal wounds from old external AICD system placed at outside hospital 25 years ago.  SURGEON:  Ivin Poot, III, MD  ANESTHESIA:  General.  PREOPERATIVE DIAGNOSIS:  Infection of old automatic implantable cardioverter defibrillator pocket and automatic implantable cardioverter defibrillator wire tract of old external automatic implantable cardioverter defibrillator system placed over 25 years  ago at outside hospital.  POSTOPERATIVE DIAGNOSIS:  Infection of old automatic implantable cardioverter defibrillator pocket and automatic implantable cardioverter defibrillator wire tract of old external automatic implantable cardioverter defibrillator system placed over 25  years ago at outside hospital.  DESCRIPTION OF PROCEDURE:  After the patient was fully assessed and informed consent documented in the preop holding area, the patient was brought back to the operating room and placed supine on the operating table.  General anesthesia was induced and he  remained hemodynamically stable.  The chest and upper abdomen were prepped and draped as a sterile field.  A proper time-out was performed.  An incision was made at the lower sternal area, which was erythematous with fluid collection.  There was no pus, but there was some granulation tissue and some serous collection which was excised and sent for cultures.  Further debridement was required  and the xiphoid was removed.  Underneath the xiphoid, there were 4 AICD wires which tracked down to the left upper quadrant wound where the old generator had been in place.  The wires were pulled from the lower position out the wound, placed on tension   and then cut so that as much as possible of the exposed wire was removed.  There is still an external epicardial AICD system on the patient's heart.  The patient has had 2 previous sternotomy.  The wound was then sharply debrided.  It was irrigated with 2 liters of pulse lavage.  It was then packed with a wound VAC sponge and the sterile sheets applied.  Next, the lower abdominal wound was examined.  It was opened.  There were some mucopurulent secretions.  This had previously grown out MSSA.  These were cultured.  The wound was debrided with curettage and excisional debridement.  It was irrigated with  copious amounts of sterile saline.  It did not go deeper than the abdominal wall.  It was packed with a second wound VAC sponge, which was covered with sterile sheets.  Both wound VACs were then connected to the suction lines, which were wind into a  single line into the wound VAC unit.  Suction was set at -125.  The patient was extubated and returned to recovery room in stable condition.  GN/NUANCE  D:04/05/2018 T:04/05/2018 JOB:[id number]

## 2018-04-05 NOTE — Anesthesia Postprocedure Evaluation (Signed)
Anesthesia Post Note  Patient: Victor [name]  Procedure(s) Performed: SUPERFICIAL STERNAL AND ABDOMINAL WOUND DEBRIDEMENT (N/A ) APPLICATION OF WOUND VAC (N/A )     Patient location during evaluation: PACU Anesthesia Type: General Level of consciousness: sedated Pain management: pain level controlled Vital Signs Assessment: post-procedure vital signs reviewed and stable Respiratory status: spontaneous breathing and respiratory function stable Cardiovascular status: stable Postop Assessment: no apparent nausea or vomiting Anesthetic complications: no    Last Vitals:  Vitals:   04/05/18 1030 04/05/18 1054  BP: (!) 169/65 (!) 153/66  Pulse: 64 67  Resp: 20 (!) 21  Temp:  (!) 36.4 C  SpO2: 100% 97%    Last Pain:  Vitals:   04/05/18 1054  TempSrc: Oral  PainSc:                  Carleta Woodrow DANIEL

## 2018-04-05 NOTE — Progress Notes (Signed)
Cr elevated at 3.34 from 2.37, provider made aware. RN will continue to monitor

## 2018-04-05 NOTE — Consult Note (Signed)
Victor [name] is a nice 82 y.o. male with medical history significant of anemia, Afib on eliquis, ischemic cardiomyopathy s/p AICD placement, CKD-3, COPD on 3L O2 via [place], CAD, s/p CABG, HTN, HLD, PVD, DM2, stroke, gerd, depression, CHF with EF 30%, OSA not on CPAP, who presented with pain, erythema in LUQ abdominal surgical site and sternal area.  He had an old AICD in the LLQ that had previously had a pocket infection treated with antibiotics and prior removal.  On 5/16 he underwent surgical debridement of a sternal and abd wound and removal of the epicardial leads. Wound Vacs were placed. On 04/02/18 creat was 1.'16mg'$ /dl.  On 04/02/18 CT scan with contrast was performed. On 5/15 creat was 2.'41mg'$ /dl. On 5/16 creat is 3.'34mg'$ /dl.  On 5/14 there was hypotension to 108/49 down from 145/65.  Renal consultation requested for assistance.   Past Medical History:  Diagnosis Date  . Anemia   . Arthritis   . Atrial fibrillation, persistent ([place]) 12/15/2017  . Cardiomyopathy, ischemic    a. s/p ICD placement  . Chronic combined systolic and diastolic CHF (congestive heart failure) ([place])   . CKD (chronic kidney disease), stage III ([place])   . COPD (chronic obstructive pulmonary disease) ([place])   . Coronary artery disease    a. s/p CABG 1990. b. multiple PCIs since that time including CTO angioplasty (without stenting) PCI of RCA 09/2014, with last cath 02/2015 done for worsening CHF with unsuccessful attempt at PCI of the RCA due to inability to cross occlusion - medical therapy advised at that time.  . Debilitated 12/15/2017  . GERD (gastroesophageal reflux disease)   . HLD (hyperlipidemia)   . Hypertension   . Occlusion and stenosis of carotid artery without mention of cerebral infarction   . On home oxygen therapy    "2L; 24/7" (11/08/2017)  . Orthostatic hypotension   . Paroxysmal ventricular tachycardia ([place])   . Peripheral vascular disease ([place])   . Raynaud's syndrome   . Stroke [facility])    a. CT head  06/2015: Small old lacunar infarct LEFT basal ganglia.  . Thrombocytopenia ([place])   . TIA (transient ischemic attack)    a. s/p LHC on 10/01/14   . Type II diabetes mellitus ([place])    Past Surgical History:  Procedure Laterality Date  . ANAL FISTULECTOMY  2000's  . APPLICATION OF WOUND VAC Left 12/29/2017   Procedure: APPLICATION OF WOUND VAC;  Surgeon: Ivin Poot, MD;  Location: [employer];  Service: Vascular;  Laterality: Left;  . CARDIAC CATHETERIZATION  10/01/2014   PTVA to CTO with restoration of TIMI 3  . CATARACT EXTRACTION, BILATERAL Bilateral ~ 2009  . CHEST EXPLORATION N/A 12/11/2017   Procedure: IRRIGATION AND DEBRIDEMENT OF CHEST HEMATOMA;  Surgeon: Ivin Poot, MD;  Location: [facility];  Service: Thoracic;  Laterality: N/A;  . CORONARY ARTERY BYPASS GRAFT  01/1989   "CABG X3"   . FINGER FRACTURE SURGERY Left ~ 2000   "pointer"  . I&D EXTREMITY Left 12/29/2017   Procedure: IRRIGATION AND DEBRIDEMENT ABDOMINAL WALL ABSCESS;  Surgeon: Ivin Poot, MD;  Location: [facility];  Service: Vascular;  Laterality: Left;  . IMPLANTABLE CARDIOVERTER DEFIBRILLATOR (ICD) GENERATOR CHANGE Left 10/05/2011   Procedure: ICD GENERATOR CHANGE;  Surgeon: Evans Lance, MD;  Location: [facility] CATH LAB;  Service: Cardiovascular;  Laterality: Left;  . LEFT AND RIGHT HEART CATHETERIZATION WITH CORONARY/GRAFT ANGIOGRAM N/A 04/26/2013   Procedure: LEFT AND RIGHT HEART CATHETERIZATION WITH CORONARY/GRAFT ANGIOGRAM;  Surgeon:  Sherren Mocha, MD;  Location: [facility] CATH LAB;  Service: Cardiovascular;  Laterality: N/A;  . LEFT HEART CATHETERIZATION WITH CORONARY ANGIOGRAM N/A 08/01/2014   Procedure: LEFT HEART CATHETERIZATION WITH CORONARY ANGIOGRAM;  Surgeon: Sinclair Grooms, MD;  Location: [facility] CATH LAB;  Service: Cardiovascular;  Laterality: N/A;  . LEFT HEART CATHETERIZATION WITH CORONARY ANGIOGRAM N/A 03/20/2015   Procedure: LEFT HEART CATHETERIZATION WITH CORONARY ANGIOGRAM;  Surgeon: Peter M Martinique, MD;  Location: [facility]  CATH LAB;  Service: Cardiovascular;  Laterality: N/A;  . PERCUTANEOUS STENT INTERVENTION  04/26/2013   Procedure: PERCUTANEOUS STENT INTERVENTION;  Surgeon: Sherren Mocha, MD;  Location: [facility] CATH LAB;  Service: Cardiovascular;;  . RIGHT HEART CATHETERIZATION  10/01/2014   Procedure: RIGHT HEART CATH;  Surgeon: Peter M Martinique, MD;  Location: [facility] CATH LAB;  Service: Cardiovascular;;  . TEE WITHOUT CARDIOVERSION N/A 01/03/2018   Procedure: TRANSESOPHAGEAL ECHOCARDIOGRAM (TEE);  Surgeon: Sueanne Margarita, MD;  Location: [facility] ENDOSCOPY;  Service: Cardiovascular;  Laterality: N/A;   Social History:  reports that he has quit smoking. His smoking use included pipe. He quit after 3.00 years of use. He has never used smokeless tobacco. He reports that he drinks alcohol. He reports that he does not use drugs. Allergies:  Allergies  Allergen Reactions  . Coreg [Carvedilol] Other (See Comments)    Shortness of breath ,fatigue ,dizzyness   . Lovastatin Other (See Comments)    CK elevation, Myalgias  . Ativan [Lorazepam] Other (See Comments)    Pt. had side effects   Family History  Problem Relation Age of Onset  . Heart failure Mother 35  . Heart attack Father   . Leukemia Sister   . Stomach cancer Maternal Grandfather     Medications:  Scheduled: . acetaminophen  1,000 mg Oral Q6H   Or  . acetaminophen (TYLENOL) oral liquid 160 mg/5 mL  1,000 mg Oral Q6H  . amiodarone  200 mg Oral Daily  . bisacodyl  10 mg Oral Daily  . bisoprolol  2.5 mg Oral Daily  . calcium-vitamin D  1 tablet Oral QHS  . docusate sodium  100 mg Oral QHS  . DULoxetine  30 mg Oral Daily  . feeding supplement (ENSURE ENLIVE)  237 mL Oral TID BM  . ferrous sulfate  325 mg Oral BID WC  . insulin aspart  0-9 Units Subcutaneous TID WC  . insulin detemir  7 Units Subcutaneous BH-q7a  . multivitamin with minerals  1 tablet Oral Daily  . pantoprazole  40 mg Oral Daily  . polyethylene glycol  17 g Oral Daily  . pravastatin  40 mg  Oral q1800  . rifampin  300 mg Oral Q12H  . senna-docusate  1 tablet Oral BID  . vitamin B-12  100 mcg Oral Daily   Continuous: . sodium chloride 50 mL/hr at 04/05/18 1256  .  ceFAZolin (ANCEF) IV     ROS: as per HPI Blood pressure (!) 142/54, pulse 63, temperature (!) 97.5 F (36.4 C), temperature source Oral, resp. rate 18, height '5\' 7"'$  (1.702 m), weight 63.8 kg (140 lb 10.5 oz), SpO2 100 %.  General appearance: alert and cooperative Head: Normocephalic, without obvious abnormality, atraumatic Eyes: conjunctivae/corneas clear. PERRL, EOM's intact. Throat: lips, mucosa, and tongue normal; teeth and gums normal Resp: clear to auscultation bilaterally Chest wall: wound vac Cardio: regular rate and rhythm, S1, S2 normal, no murmur, click, rub or gallop GI: woud vac present Extremities: extremities normal, atraumatic, no cyanosis or edema Neurologic: Grossly  normal   Results for orders placed or performed during the hospital encounter of 04/02/18 (from the past 48 hour(s))  Urinalysis, Routine w reflex microscopic     Status: Abnormal   Collection Time: 04/03/18  6:24 PM  Result Value Ref Range   Color, Urine YELLOW YELLOW   APPearance HAZY (A) CLEAR   Specific Gravity, Urine 1.026 1.005 - 1.030   pH 5.0 5.0 - 8.0   Glucose, UA NEGATIVE NEGATIVE mg/dL   Hgb urine dipstick NEGATIVE NEGATIVE   Bilirubin Urine NEGATIVE NEGATIVE   Ketones, ur NEGATIVE NEGATIVE mg/dL   Protein, ur NEGATIVE NEGATIVE mg/dL   Nitrite NEGATIVE NEGATIVE   Leukocytes, UA NEGATIVE NEGATIVE    Comment: Performed at [facility] [address] [place], [place] [address]  Glucose, capillary     Status: Abnormal   Collection Time: 04/03/18  9:06 PM  Result Value Ref Range   Glucose-Capillary 199 (H) 65 - 99 mg/dL   Comment 1 Notify RN   APTT     Status: Abnormal   Collection Time: 04/04/18  2:23 AM  Result Value Ref Range   aPTT 46 (H) 24 - 36 seconds    Comment:        IF BASELINE aPTT IS  ELEVATED, SUGGEST PATIENT RISK ASSESSMENT BE USED TO DETERMINE APPROPRIATE ANTICOAGULANT THERAPY. Performed at [facility], [place] [address] [place], [place] [address]   Protime-INR     Status: Abnormal   Collection Time: 04/04/18  2:23 AM  Result Value Ref Range   Prothrombin Time 24.1 (H) 11.4 - 15.2 seconds   INR 2.18     Comment: Performed at [facility] [address], [place], [place] [address]  CBC     Status: Abnormal   Collection Time: 04/04/18  2:23 AM  Result Value Ref Range   WBC 9.9 4.0 - 10.5 K/uL   RBC 3.38 (L) 4.22 - 5.81 MIL/uL   Hemoglobin 8.4 (L) 13.0 - 17.0 g/dL   HCT 27.4 (L) 39.0 - 52.0 %   MCV 81.1 78.0 - 100.0 fL   MCH 24.9 (L) 26.0 - 34.0 pg   MCHC 30.7 30.0 - 36.0 g/dL   RDW 14.8 11.5 - 15.5 %   Platelets 220 150 - 400 K/uL    Comment: Performed at [facility], [place] [address] [place], [place] [address]  Basic metabolic panel     Status: Abnormal   Collection Time: 04/04/18  2:23 AM  Result Value Ref Range   Sodium 134 (L) 135 - 145 mmol/L   Potassium 4.8 3.5 - 5.1 mmol/L   Chloride 89 (L) 101 - 111 mmol/L   CO2 35 (H) 22 - 32 mmol/L   Glucose, Bld 194 (H) 65 - 99 mg/dL   BUN 33 (H) 6 - 20 mg/dL   Creatinine, Ser 2.41 (H) 0.61 - 1.24 mg/dL    Comment: DELTA CHECK NOTED   Calcium 8.7 (L) 8.9 - 10.3 mg/dL   GFR calc non Af Amer 24 (L) >60 mL/min   GFR calc Af Amer 28 (L) >60 mL/min    Comment: (NOTE) The eGFR has been calculated using the CKD EPI equation. This calculation has not been validated in all clinical situations. eGFR's persistently <60 mL/min signify possible Chronic Kidney Disease.    Anion gap 10 5 - 15    Comment: Performed at [facility] [address] [place] [address], [place], [place] [address]  Glucose, capillary  Status: Abnormal   Collection Time: 04/04/18  8:13 AM  Result Value Ref Range   Glucose-Capillary 146 (H) 65 - 99 mg/dL  CBC     Status: Abnormal   Collection Time: 04/04/18  8:47 AM  Result Value  Ref Range   WBC 11.2 (H) 4.0 - 10.5 K/uL   RBC 3.73 (L) 4.22 - 5.81 MIL/uL   Hemoglobin 9.4 (L) 13.0 - 17.0 g/dL   HCT 30.1 (L) 39.0 - 52.0 %   MCV 80.7 78.0 - 100.0 fL   MCH 25.2 (L) 26.0 - 34.0 pg   MCHC 31.2 30.0 - 36.0 g/dL   RDW 14.7 11.5 - 15.5 %   Platelets 224 150 - 400 K/uL    Comment: Performed at [facility], [place]. [address], [place], [place] [address]  Comprehensive metabolic panel     Status: Abnormal   Collection Time: 04/04/18  8:47 AM  Result Value Ref Range   Sodium 135 135 - 145 mmol/L   Potassium 3.8 3.5 - 5.1 mmol/L   Chloride 91 (L) 101 - 111 mmol/L   CO2 30 22 - 32 mmol/L   Glucose, Bld 154 (H) 65 - 99 mg/dL   BUN 35 (H) 6 - 20 mg/dL   Creatinine, Ser 2.37 (H) 0.61 - 1.24 mg/dL   Calcium 8.6 (L) 8.9 - 10.3 mg/dL   Total Protein 7.1 6.5 - 8.1 g/dL   Albumin 2.9 (L) 3.5 - 5.0 g/dL   AST 35 15 - 41 U/L   ALT 21 17 - 63 U/L   Alkaline Phosphatase 121 38 - 126 U/L   Total Bilirubin 0.8 0.3 - 1.2 mg/dL   GFR calc non Af Amer 24 (L) >60 mL/min   GFR calc Af Amer 28 (L) >60 mL/min    Comment: (NOTE) The eGFR has been calculated using the CKD EPI equation. This calculation has not been validated in all clinical situations. eGFR's persistently <60 mL/min signify possible Chronic Kidney Disease.    Anion gap 14 5 - 15    Comment: Performed at [facility] [address] [place], [place] [address]  Prepare RBC (crossmatch)     Status: None   Collection Time: 04/04/18  8:47 AM  Result Value Ref Range   Order Confirmation      ORDER PROCESSED BY BLOOD BANK Performed at [facility], [place] [address], [place], [place] [address]   Type and screen [place]     Status: None (Preliminary result)   Collection Time: 04/04/18  9:00 AM  Result Value Ref Range   ABO/RH(D) O NEG    Antibody Screen POS    Sample Expiration 04/07/2018    Antibody Identification      ANTI FYA ([place] a) Performed at [facility], [place]  [address], [place], [place] [address]    Unit Number [id number]    Blood Component Type RED CELLS,LR    Unit division 00    Status of Unit ALLOCATED    Donor AG Type NEGATIVE FOR DUFFY A ANTIGEN    Transfusion Status OK TO TRANSFUSE    Crossmatch Result COMPATIBLE    Unit Number [id number]    Blood Component Type RED CELLS,LR    Unit division 00    Status of Unit ALLOCATED    Donor AG Type NEGATIVE FOR DUFFY A ANTIGEN    Transfusion Status OK TO TRANSFUSE    Crossmatch Result COMPATIBLE   Glucose, capillary  Status: Abnormal   Collection Time: 04/04/18 12:12 PM  Result Value Ref Range   Glucose-Capillary 149 (H) 65 - 99 mg/dL  Glucose, capillary     Status: Abnormal   Collection Time: 04/04/18  4:11 PM  Result Value Ref Range   Glucose-Capillary 157 (H) 65 - 99 mg/dL  Glucose, capillary     Status: Abnormal   Collection Time: 04/04/18  9:29 PM  Result Value Ref Range   Glucose-Capillary 194 (H) 65 - 99 mg/dL  Basic metabolic panel     Status: Abnormal   Collection Time: 04/05/18  3:39 AM  Result Value Ref Range   Sodium 134 (L) 135 - 145 mmol/L   Potassium 4.1 3.5 - 5.1 mmol/L   Chloride 90 (L) 101 - 111 mmol/L   CO2 30 22 - 32 mmol/L   Glucose, Bld 162 (H) 65 - 99 mg/dL   BUN 40 (H) 6 - 20 mg/dL   Creatinine, Ser 3.34 (H) 0.61 - 1.24 mg/dL   Calcium 8.3 (L) 8.9 - 10.3 mg/dL   GFR calc non Af Amer 16 (L) >60 mL/min   GFR calc Af Amer 19 (L) >60 mL/min    Comment: (NOTE) The eGFR has been calculated using the CKD EPI equation. This calculation has not been validated in all clinical situations. eGFR's persistently <60 mL/min signify possible Chronic Kidney Disease.    Anion gap 14 5 - 15    Comment: Performed at [facility] [address], [place], [place] [address]  Glucose, capillary     Status: Abnormal   Collection Time: 04/05/18  5:37 AM  Result Value Ref Range   Glucose-Capillary 146 (H) 65 - 99 mg/dL   Comment 1 Notify RN    Comment 2 Document in  Chart   Aerobic/Anaerobic Culture (surgical/deep wound)     Status: None (Preliminary result)   Collection Time: 04/05/18  8:41 AM  Result Value Ref Range   Specimen Description WOUND    Special Requests STERNAL WOUND    Gram Stain      MODERATE WBC PRESENT, PREDOMINANTLY PMN RARE GRAM POSITIVE COCCI IN CLUSTERS Performed at [facility], [place] [address], [place], [place] [address]    Culture PENDING    Report Status PENDING   Glucose, capillary     Status: Abnormal   Collection Time: 04/05/18 10:06 AM  Result Value Ref Range   Glucose-Capillary 177 (H) 65 - 99 mg/dL   Comment 1 Notify RN    Comment 2 Document in Chart   Glucose, capillary     Status: Abnormal   Collection Time: 04/05/18 10:59 AM  Result Value Ref Range   Glucose-Capillary 177 (H) 65 - 99 mg/dL  Glucose, capillary     Status: Abnormal   Collection Time: 04/05/18 12:57 PM  Result Value Ref Range   Glucose-Capillary 172 (H) 65 - 99 mg/dL  Glucose, capillary     Status: Abnormal   Collection Time: 04/05/18  5:06 PM  Result Value Ref Range   Glucose-Capillary 168 (H) 65 - 99 mg/dL   Dg Chest 2 View  Result Date: 04/04/2018 CLINICAL DATA:  Pre op chest exam. Pt states he has an infection at the site of his previous pacemaker. He has a new pacemaker at his left shoulder. EXAM: CHEST - 2 VIEW COMPARISON:  Chest x-ray dated 12/28/2017. FINDINGS: Stable cardiomegaly. No confluent opacity to suggest a developing pneumonia. No pleural effusion or pneumothorax seen. No acute or suspicious osseous  finding. LEFT chest wall pacemaker/ICD apparatus appears stable in position with intact lead. IMPRESSION: No active cardiopulmonary disease. No evidence of pneumonia or pulmonary edema. Stable cardiomegaly. Electronically Signed   By: Franki Cabot M.D.   On: 04/04/2018 22:36   Dg Chest Port 1 View  Result Date: 04/05/2018 CLINICAL DATA:  Status post sternal and abdominal wound debridement. Shortness of breath, cough EXAM:  PORTABLE CHEST 1 VIEW COMPARISON:  04/04/2018 FINDINGS: Prior CABG. Left AICD remains in place, unchanged. Cardiomegaly. Low lung volumes without confluent opacity or effusion. No pneumothorax. IMPRESSION: Low lung volumes, cardiomegaly.  No active disease. Electronically Signed   By: Rolm Baptise M.D.   On: 04/05/2018 10:33    Assessment:  1 AKI ---contrast and hemodynamically mediated  Plan: 1 supportive  Estanislado Emms, MD 04/05/2018, 5:17 PM

## 2018-04-05 NOTE — Brief Op Note (Signed)
04/05/2018  9:35 AM  PATIENT:  Victor [name]  82 y.o. male  PRE-OPERATIVE DIAGNOSIS:  WOUND INFECTION  POST-OPERATIVE DIAGNOSIS:  WOUND INFECTION  PROCEDURE:  Procedure(s): SUPERFICIAL STERNAL AND ABDOMINAL WOUND DEBRIDEMENT (N/A) APPLICATION OF WOUND VAC (N/A)  Explantation of epicardial AICD leads  SURGEON:  Surgeon(s) and Role: Panel 1:    * Ivin Poot, MD - Primary Panel 2:    * Ivin Poot, MD - Primary  PHYSICIAN ASSISTANT: none  ASSISTANTS: Ara Kussmaul RNFA  ANESTHESIA:   general  EBL:  30  cc  BLOOD ADMINISTERED:none  DRAINS: 2 wound VACs   LOCAL MEDICATIONS USED:  NONE  SPECIMEN:  AICD leads  DISPOSITION OF SPECIMEN:  PATHOLOGY  COUNTS:  YES  TOURNIQUET:  * No tourniquets in log *  DICTATION: .Dragon Dictation  PLAN OF CARE: return to 2 C  PATIENT DISPOSITION:  PACU - hemodynamically stable.   Delay start of Pharmacological VTE agent (>24hrs) due to surgical blood loss or risk of bleeding: yes  No eliquis until may 18

## 2018-04-06 ENCOUNTER — Encounter (HOSPITAL_COMMUNITY): Payer: Self-pay | Admitting: Cardiothoracic Surgery

## 2018-04-06 DIAGNOSIS — N179 Acute kidney failure, unspecified: Secondary | ICD-10-CM

## 2018-04-06 DIAGNOSIS — R112 Nausea with vomiting, unspecified: Secondary | ICD-10-CM

## 2018-04-06 LAB — BASIC METABOLIC PANEL
Anion gap: 13 (ref 5–15)
BUN: 45 mg/dL — ABNORMAL HIGH (ref 6–20)
CO2: 32 mmol/L (ref 22–32)
Calcium: 8.3 mg/dL — ABNORMAL LOW (ref 8.9–10.3)
Chloride: 90 mmol/L — ABNORMAL LOW (ref 101–111)
Creatinine, Ser: 4.46 mg/dL — ABNORMAL HIGH (ref 0.61–1.24)
GFR calc Af Amer: 13 mL/min — ABNORMAL LOW (ref >60)
GFR calc non Af Amer: 11 mL/min — ABNORMAL LOW (ref >60)
Glucose, Bld: 167 mg/dL — ABNORMAL HIGH (ref 65–99)
Potassium: 4.2 mmol/L (ref 3.5–5.1)
Sodium: 135 mmol/L (ref 135–145)

## 2018-04-06 LAB — BLOOD GAS, ARTERIAL
Acid-Base Excess: 6.9 mmol/L — ABNORMAL HIGH (ref 0.0–2.0)
Bicarbonate: 31.4 mmol/L — ABNORMAL HIGH (ref 20.0–28.0)
Drawn by: 27021
O2 Content: 2 L/min
O2 Saturation: 97.9 %
Patient temperature: 98.6
pCO2 arterial: 49.3 mmHg — ABNORMAL HIGH (ref 32.0–48.0)
pH, Arterial: 7.42 (ref 7.350–7.450)
pO2, Arterial: 97 mmHg (ref 83.0–108.0)

## 2018-04-06 LAB — CBC
HCT: 26.4 % — ABNORMAL LOW (ref 39.0–52.0)
Hemoglobin: 8.3 g/dL — ABNORMAL LOW (ref 13.0–17.0)
MCH: 25.3 pg — ABNORMAL LOW (ref 26.0–34.0)
MCHC: 31.4 g/dL (ref 30.0–36.0)
MCV: 80.5 fL (ref 78.0–100.0)
Platelets: 237 10*3/uL (ref 150–400)
RBC: 3.28 MIL/uL — ABNORMAL LOW (ref 4.22–5.81)
RDW: 14.6 % (ref 11.5–15.5)
WBC: 9.4 10*3/uL (ref 4.0–10.5)

## 2018-04-06 LAB — HEPATIC FUNCTION PANEL
ALBUMIN: 2.6 g/dL — AB (ref 3.5–5.0)
ALT: 8 U/L — ABNORMAL LOW (ref 17–63)
AST: 21 U/L (ref 15–41)
Alkaline Phosphatase: 114 U/L (ref 38–126)
BILIRUBIN TOTAL: 1 mg/dL (ref 0.3–1.2)
Bilirubin, Direct: 0.4 mg/dL (ref 0.1–0.5)
Indirect Bilirubin: 0.6 mg/dL (ref 0.3–0.9)
TOTAL PROTEIN: 6.5 g/dL (ref 6.5–8.1)

## 2018-04-06 LAB — GLUCOSE, CAPILLARY
GLUCOSE-CAPILLARY: 128 mg/dL — AB (ref 65–99)
GLUCOSE-CAPILLARY: 86 mg/dL (ref 65–99)
Glucose-Capillary: 148 mg/dL — ABNORMAL HIGH (ref 65–99)
Glucose-Capillary: 85 mg/dL (ref 65–99)

## 2018-04-06 MED ORDER — DARBEPOETIN ALFA 100 MCG/0.5ML IJ SOSY
100.0000 ug | PREFILLED_SYRINGE | INTRAMUSCULAR | Status: DC
  Administered 2018-04-06: 100 ug via SUBCUTANEOUS
  Filled 2018-04-06 (×2): qty 0.5

## 2018-04-06 MED ORDER — SODIUM CHLORIDE 0.9 % IV SOLN
INTRAVENOUS | Status: DC
  Administered 2018-04-06 (×2): via INTRAVENOUS

## 2018-04-06 MED ORDER — PROMETHAZINE HCL 25 MG/ML IJ SOLN
12.5000 mg | Freq: Once | INTRAMUSCULAR | Status: AC
  Administered 2018-04-06: 12.5 mg via INTRAVENOUS
  Filled 2018-04-06: qty 1

## 2018-04-06 MED ORDER — RIFAMPIN 300 MG PO CAPS
300.0000 mg | ORAL_CAPSULE | Freq: Two times a day (BID) | ORAL | Status: DC
  Administered 2018-04-06 – 2018-04-09 (×7): 300 mg via ORAL
  Filled 2018-04-06 (×8): qty 1

## 2018-04-06 NOTE — Progress Notes (Signed)
PT Cancellation Note  Patient Details Name: Victor [name] MRN: [id number] DOB: [birth year]   Cancelled Treatment:    Reason Eval/Treat Not Completed: Medical issues which prohibited therapy. Pt with N&V this AM, actively vomiting when PT entered room. Pt declining PT, stating "I am not OK. I just can't right now."  RN aware.   Lorriane Shire 04/06/2018, 11:07 AM

## 2018-04-06 NOTE — Progress Notes (Addendum)
      [address]       [place] [address]             [phone]      1 Day Post-Op Procedure(s) (LRB): SUPERFICIAL STERNAL AND ABDOMINAL WOUND DEBRIDEMENT (N/A) APPLICATION OF WOUND VAC (N/A) Subjective: Coughing this morning. He is nauseated therefore he does not want to eat or drink anything.   Objective: Vital signs in last 24 hours: Temp:  [97 F (36.1 C)-97.7 F (36.5 C)] 97.7 F (36.5 C) (05/17 0436) Pulse Rate:  [55-70] 57 (05/17 0436) Cardiac Rhythm: Sinus bradycardia (05/17 0300) Resp:  [15-21] 16 (05/17 0436) BP: (119-169)/(54-84) 128/56 (05/17 0436) SpO2:  [91 %-100 %] 100 % (05/17 0436) Weight:  [152 lb 1.9 oz (69 kg)] 152 lb 1.9 oz (69 kg) (05/17 0500)     Intake/Output from previous day: 05/16 0701 - 05/17 0700 In: 1443.3 [P.O.:240; I.V.:1203.3] Out: 425 [Urine:375; Blood:50] Intake/Output this shift: No intake/output data recorded.  General appearance: alert, cooperative and mild distress Heart: regular rate and rhythm, S1, S2 normal, no murmur, click, rub or gallop Lungs: clear to auscultation bilaterally Abdomen: soft, non-tender; bowel sounds normal; no masses,  no organomegaly Extremities: extremities normal, atraumatic, no cyanosis or edema Wound: wound vac with good suction  Lab Results: Recent Labs    04/04/18 0847 04/06/18 0331  WBC 11.2* 9.4  HGB 9.4* 8.3*  HCT 30.1* 26.4*  PLT 224 237   BMET:  Recent Labs    04/05/18 0339 04/06/18 0331  NA 134* 135  K 4.1 4.2  CL 90* 90*  CO2 30 32  GLUCOSE 162* 167*  BUN 40* 45*  CREATININE 3.34* 4.46*  CALCIUM 8.3* 8.3*    PT/INR:  Recent Labs    04/04/18 0223  LABPROT 24.1*  INR 2.18   ABG    Component Value Date/Time   PHART 7.420 04/06/2018 0415   HCO3 31.4 (H) 04/06/2018 0415   TCO2 32 03/20/2015 1638   O2SAT 97.9 04/06/2018 0415   CBG (last 3)  Recent Labs    04/05/18 1257 04/05/18 1706 04/05/18 2108  GLUCAP 172* 168* 148*     Assessment/Plan: S/P Procedure(s) (LRB): SUPERFICIAL STERNAL AND ABDOMINAL WOUND DEBRIDEMENT (N/A) APPLICATION OF WOUND VAC (N/A)  1. CV-SB rate in the upper 50s, BP well controlled. Continue Amio 200 daily. Not on a BB. Continue statin.  2. Pulm-tolerating room air with excellent oxygenation 3. Renal-CKD creatinine 4.46, electrolytes okay. Medicine following and assisting. 4. Lower sternal and upper abdominal wound from an old external AICD system placed I and D yesterday. ID following and dosing abx. WBC 9.4.  5. Endo-blood glucose level with moderate control.  6. Nausea-on zofran, continue IV fluids until tolerating more PO.   Plan: Wound vac with good suction. Work on nausea and improving oral intake as tolerated.    LOS: 4 days    Elgie Collard 5/17/2019operative cultures -- staph aureus [MSSA]cont Ancef Change wound vac in room M-W-F  patient examined and medical record reviewed,agree with above note. Tharon Aquas Trigt III 04/06/2018

## 2018-04-06 NOTE — Progress Notes (Signed)
Subjective:  Hemodynamically stable- minimal UOP 375- crt up again.  "I threw up all over " very oriented and frustrated regarding situation Objective Vital signs in last 24 hours: Vitals:   04/05/18 2307 04/06/18 0300 04/06/18 0436 04/06/18 0500  BP: (!) 137/55  (!) 128/56   Pulse: 61 (!) 55 (!) 57   Resp: 20 16 16    Temp: 97.6 F (36.4 C)  97.7 F (36.5 C)   TempSrc: Oral  Oral   SpO2: 100% 100% 100%   Weight:    69 kg (152 lb 1.9 oz)  Height:       Weight change: 5.2 kg (11 lb 7.4 oz)  Intake/Output Summary (Last 24 hours) at 04/06/2018 8676 Last data filed at 04/06/2018 0700 Gross per 24 hour  Intake 1443.33 ml  Output 365 ml  Net 1078.33 ml    Assessment/ Plan: Pt is a 82 y.o. yo male multiple medical issues - EF30%- CKD 3 who was admitted on 04/02/2018 with infected wounds related to past ICD placement- s/p removal- I and D - AKI Assessment/Plan: 1. Renal- AKI- crt 1.16 prior- in the setting of contrast and surgery with some hemodynamic instability. UOP still marginal and crt up unfortunately - no acute indications for RRT at this time- cont to watch UOP and crt closely.  Mentions that his first wife was "killed with dialysis"  If it came to that I dont think he would do it but there is good possibility that it would only be short term 2. HTN/vol- if anything is dry, not wet- getting 1/2 NS with sodium of 135- will change to NS at 75 per hour 3. Anemia- hgb dropping - will add ESA- no iron in this sitation 4. [place]- K and calc OK     Elleah Hemsley A    Labs: Basic Metabolic Panel: Recent Labs  Lab 04/04/18 0847 04/05/18 0339 04/06/18 0331  NA 135 134* 135  K 3.8 4.1 4.2  CL 91* 90* 90*  CO2 30 30 32  GLUCOSE 154* 162* 167*  BUN 35* 40* 45*  CREATININE 2.37* 3.34* 4.46*  CALCIUM 8.6* 8.3* 8.3*   Liver Function Tests: Recent Labs  Lab 04/02/18 1916 04/04/18 0847 04/06/18 0331  AST 23 35 21  ALT 16* 21 8*  ALKPHOS 129* 121 114  BILITOT 0.6 0.8  1.0  PROT 7.7 7.1 6.5  ALBUMIN 3.1* 2.9* 2.6*   No results for input(s): LIPASE, AMYLASE in the last 168 hours. No results for input(s): AMMONIA in the last 168 hours. CBC: Recent Labs  Lab 04/02/18 1916 04/03/18 0039 04/04/18 0223 04/04/18 0847 04/06/18 0331  WBC 15.3* 13.3* 9.9 11.2* 9.4  NEUTROABS 12.9*  --   --   --   --   HGB 10.1* 9.5* 8.4* 9.4* 8.3*  HCT 32.4* 30.3* 27.4* 30.1* 26.4*  MCV 81.0 80.6 81.1 80.7 80.5  PLT 272 260 220 224 237   Cardiac Enzymes: No results for input(s): CKTOTAL, CKMB, CKMBINDEX, TROPONINI in the last 168 hours. CBG: Recent Labs  Lab 04/05/18 1006 04/05/18 1059 04/05/18 1257 04/05/18 1706 04/05/18 2108  GLUCAP 177* 177* 172* 168* 148*    Iron Studies: No results for input(s): IRON, TIBC, TRANSFERRIN, FERRITIN in the last 72 hours. Studies/Results: Dg Chest 2 View  Result Date: 04/04/2018 CLINICAL DATA:  Pre op chest exam. Pt states he has an infection at the site of his previous pacemaker. He has a new pacemaker at his left shoulder. EXAM: CHEST - 2 VIEW COMPARISON:  Chest x-ray dated 12/28/2017. FINDINGS: Stable cardiomegaly. No confluent opacity to suggest a developing pneumonia. No pleural effusion or pneumothorax seen. No acute or suspicious osseous finding. LEFT chest wall pacemaker/ICD apparatus appears stable in position with intact lead. IMPRESSION: No active cardiopulmonary disease. No evidence of pneumonia or pulmonary edema. Stable cardiomegaly. Electronically Signed   By: Franki Cabot M.D.   On: 04/04/2018 22:36   Dg Chest Port 1 View  Result Date: 04/05/2018 CLINICAL DATA:  Status post sternal and abdominal wound debridement. Shortness of breath, cough EXAM: PORTABLE CHEST 1 VIEW COMPARISON:  04/04/2018 FINDINGS: Prior CABG. Left AICD remains in place, unchanged. Cardiomegaly. Low lung volumes without confluent opacity or effusion. No pneumothorax. IMPRESSION: Low lung volumes, cardiomegaly.  No active disease. Electronically  Signed   By: Rolm Baptise M.D.   On: 04/05/2018 10:33   Medications: Infusions: . sodium chloride 50 mL/hr at 04/06/18 0500  .  ceFAZolin (ANCEF) IV 1 g (04/06/18 5038)    Scheduled Medications: . acetaminophen  1,000 mg Oral Q6H   Or  . acetaminophen (TYLENOL) oral liquid 160 mg/5 mL  1,000 mg Oral Q6H  . amiodarone  200 mg Oral Daily  . bisacodyl  10 mg Oral Daily  . bisoprolol  2.5 mg Oral Daily  . calcium-vitamin D  1 tablet Oral QHS  . docusate sodium  100 mg Oral QHS  . DULoxetine  30 mg Oral Daily  . feeding supplement (ENSURE ENLIVE)  237 mL Oral TID BM  . ferrous sulfate  325 mg Oral BID WC  . insulin aspart  0-9 Units Subcutaneous TID WC  . insulin detemir  7 Units Subcutaneous BH-q7a  . multivitamin with minerals  1 tablet Oral Daily  . pantoprazole  40 mg Oral Daily  . polyethylene glycol  17 g Oral Daily  . pravastatin  40 mg Oral q1800  . rifampin  300 mg Oral Q12H  . senna-docusate  1 tablet Oral BID  . vitamin B-12  100 mcg Oral Daily    have reviewed scheduled and prn medications.  Physical Exam: General: alert, frustrated Heart: RRR Lungs: mostly clear Abdomen: wound vac in sternum and LQ- otherwise belly soft Extremities: no edema     04/06/2018,9:06 AM  LOS: 4 days

## 2018-04-06 NOTE — Progress Notes (Signed)
[facility] for Infectious Disease  Date of Admission:  04/02/2018   Total days of antibiotics 4         Day 3 Ancef        Day 4 Rifampin           Patient ID: Victor [name] is a 82 y.o. male with   Principal Problem:   Abdominal wall fluid collections Active Problems:   Automatic implantable cardioverter-defibrillator in situ   Abscess of chest wall   Type II diabetes mellitus with renal manifestations (HCC)   Chronic systolic CHF (congestive heart failure) (HCC)   Essential hypertension   GERD (gastroesophageal reflux disease)   Hx of CABG 1990   Stage 3 chronic kidney disease (HCC)   COPD (chronic obstructive pulmonary disease) (HCC)   Sepsis (HCC)   Atrial fibrillation, chronic (HCC)   HLD (hyperlipidemia)   Stroke (HCC)   CAD (coronary artery disease)   Fall   Protein-calorie malnutrition, severe   . acetaminophen  1,000 mg Oral Q6H   Or  . acetaminophen (TYLENOL) oral liquid 160 mg/5 mL  1,000 mg Oral Q6H  . amiodarone  200 mg Oral Daily  . bisacodyl  10 mg Oral Daily  . bisoprolol  2.5 mg Oral Daily  . calcium-vitamin D  1 tablet Oral QHS  . darbepoetin (ARANESP) injection - NON-DIALYSIS  100 mcg Subcutaneous Q Fri-1800  . docusate sodium  100 mg Oral QHS  . DULoxetine  30 mg Oral Daily  . feeding supplement (ENSURE ENLIVE)  237 mL Oral TID BM  . ferrous sulfate  325 mg Oral BID WC  . insulin aspart  0-9 Units Subcutaneous TID WC  . insulin detemir  7 Units Subcutaneous BH-q7a  . multivitamin with minerals  1 tablet Oral Daily  . pantoprazole  40 mg Oral Daily  . polyethylene glycol  17 g Oral Daily  . pravastatin  40 mg Oral q1800  . rifampin  300 mg Oral BID WC  . senna-docusate  1 tablet Oral BID  . vitamin B-12  100 mcg Oral Daily    SUBJECTIVE: Not feeling well. Nauseated and vomited twice overnight through this morning. Has not eaten at all due to nausea since being here. Upset about moving rooms as he likes the nurses/team here on  2C. Afebrile. Leukocytosis resolved.    Allergies  Allergen Reactions  . Coreg [Carvedilol] Other (See Comments)    Shortness of breath ,fatigue ,dizzyness   . Lovastatin Other (See Comments)    CK elevation, Myalgias  . Ativan [Lorazepam] Other (See Comments)    Pt. had side effects    OBJECTIVE: Vitals:   04/06/18 0436 04/06/18 0500 04/06/18 0700 04/06/18 1100  BP: (!) 128/56  135/60 (!) 136/59  Pulse: (!) 57   (!) 58  Resp: 16   17  Temp: 97.7 F (36.5 C)   97.8 F (36.6 C)  TempSrc: Oral   Oral  SpO2: 100%   100%  Weight:  152 lb 1.9 oz (69 kg)    Height:       Body mass index is 23.82 kg/m.  Physical Exam  Constitutional: He is oriented to person, place, and time.  Chronically ill appearing.   HENT:  Mouth/Throat: No oral lesions. Normal dentition. No dental caries.  Poor dentition with multiple carries and necrotic teeth.   Eyes: No scleral icterus.  Right sclera red.   Cardiovascular: Normal rate, regular rhythm and  intact distal pulses.  Murmur heard. Pulmonary/Chest: Effort normal and breath sounds normal. No respiratory distress.  Abdominal: Soft. Bowel sounds are normal. He exhibits no distension. There is no tenderness.  Wound vac in place with good seal to abdomen and lower chest. Lower chest with slight surrounding erythema - improved since before debridement.   Lymphadenopathy:    He has no cervical adenopathy.  Neurological: He is alert and oriented to person, place, and time.  Skin: Skin is warm and dry. Capillary refill takes less than 2 seconds. No rash noted.  Psychiatric: He has a normal mood and affect. Judgment normal.  Vitals reviewed.   Lab Results Lab Results  Component Value Date   WBC 9.4 04/06/2018   HGB 8.3 (L) 04/06/2018   HCT 26.4 (L) 04/06/2018   MCV 80.5 04/06/2018   PLT 237 04/06/2018    Lab Results  Component Value Date   CREATININE 4.46 (H) 04/06/2018   BUN 45 (H) 04/06/2018   NA 135 04/06/2018   K 4.2 04/06/2018    CL 90 (L) 04/06/2018   CO2 32 04/06/2018    Lab Results  Component Value Date   ALT 8 (L) 04/06/2018   AST 21 04/06/2018   ALKPHOS 114 04/06/2018   BILITOT 1.0 04/06/2018     Microbiology: BCx 5/13 > NG  Surgical Culture 5/16 > pending   ASSESSMENT: 82 y.o. male with history of MSSA infection of rectus sheath muscle / intraabdominal abscess at the site of previous AICD generator. POD1 debridement of both sites. Preliminary report with staphylococcal aureus with sensitivities pending. He will need 6 weeks of dual therapy IV cefazolin + rifampin 300 mg BID (barring sensi's from recent surgery match with MSSA we are assuming). Will need central access however his current kidney function may limit to tunneled catheter only - has ICD to left chest.   He does have at least one remaining wire - will continue Rifampin. It is likely the reason for his GI upset. Will change timing to be taken twice daily with meals and pre-medicate with zofran to help with this.   His kidney function continues to worsen. Will need Q24h cefazolin dosing if this continues. OK for 1gm Q12h for now with continued follow up.   Drug interactions with amiodarone and apixaban noted. Will follow while on rifampin inpatient to determine tolerability. Will need to consider coumadin during this time frame of treatment if he can tolerate rifampin.   PLAN: 1. Continue cefazolin at current dose. If kidney function continues to worsen and CrCl drops < 10 will need to adjust to cefazolin 1 gm Q24h.  2. Continue Rifampin - pre-medicate with antiemetic  3. He will need central access via tunneled catheter vs PICC per primary team/nephrology.  4. Check LFTs again next week.    Dr. Johnnye Sima will follow up on culture/sensitivities this weekend via chart and is available for ID questions. Dr. Baxter Flattery and myself to see him back on Monday.   Janene Madeira, MSN, NP-C [facility] for [facility]ious [facility] Cell: [phone] Pager: [phone]  04/06/2018  11:30 AM

## 2018-04-06 NOTE — Progress Notes (Signed)
PROGRESS NOTE    Victor [name]  JIR:[id number] DOB: Dec 13, 1935 DOA: 04/02/2018 PCP: Cyndi Bender, PA-C   Brief Narrative:82 y.o.M with COPD on 3L home O2, CAD s/p CABG, CHF EF 30% with ICD, HTN, PVD, DM, stroke, and COPD not on CPAP who presents with sternal redness for 4 days.    Assessment & Plan:   Principal Problem:   Abdominal wall fluid collections Active Problems:   Type II diabetes mellitus with renal manifestations (HCC)   Automatic implantable cardioverter-defibrillator in situ   Chronic systolic CHF (congestive heart failure) (HCC)   Essential hypertension   GERD (gastroesophageal reflux disease)   Hx of CABG 1990   Stage 3 chronic kidney disease (HCC)   COPD (chronic obstructive pulmonary disease) (HCC)   Abscess of chest wall   Sepsis (HCC)   Atrial fibrillation, chronic (HCC)   HLD (hyperlipidemia)   Stroke ([place])   CAD (coronary artery disease)   Fall   Protein-calorie malnutrition, severe  Sternal abscess Abdomen wall abscess s/p WOUND VAC TIMES 2.  -Continue cefazolin -Continue rifampin -ID foll.CTS foll. Acute on chronickidney disease stage III?  Contrast-induced versus hemodynamic instability.  Slow IV hydration. Baseline creatinine 1.0-1.2.trippled.-Holddiuretics - vancomycin stopped.  Acute on chronic systolic and diastolic CHF Pleural effusions on CT chest, orthopnea, dyspnea on exertion. BNP elevated. AKI with diuresis yesteray. -Gentle IVF -Strict ins and outs, daily weights  Type 2 diabetes -Continuehome Levemir and SSI  Hypertension Coronary artery disease History of stroke -ContinueBB, statin  COPD Chronic respiratory failure 3 L home O2 No active disease.   Atrial fibrillation, permanent Chads 2 vascular 8. On Eliquis at home, currently held. -Continuebisoprolol and amiodarone -HoldEliquis, washout and then surgical debridement per CT surgery. -CHADS2Vasc high,  RESUME ANTI COAG WHEN OK WITH SURGERY.   Unfortunately we cannot start him back on DOAC s As it is contraindicated will have to start him on Coumadin.  Appreciate pharmacy input. Other medications -Continue duloxetine  Anemia Unclear etiology -Continueiron, B12 supplements      DVT prophylaxis SCD Code Status: Full code Family Communication: No family available patient reports that he only has a grandson here and a daughter in [place] who is been medically ill. Disposition Plan: Patient lives alone TBD Consultants infectious disease, nephrology, cardiothoracic surgery  Procedures status post wound VAC 04/05/2018 to the chest into the abdomen  Antimicrobials: Cefepime and rifampin  Subjective: Resting in bed very frustrated about the situation he appears not to want dialysis if that state overcomes.  He feels better if Reita Cliche takes him home to be with his wife.   Objective: Vitals:   04/06/18 0436 04/06/18 0500 04/06/18 0700 04/06/18 1100  BP: (!) 128/56  135/60 (!) 136/59  Pulse: (!) 57   (!) 58  Resp: 16   17  Temp: 97.7 F (36.5 C)   97.8 F (36.6 C)  TempSrc: Oral   Oral  SpO2: 100%   100%  Weight:  69 kg (152 lb 1.9 oz)    Height:        Intake/Output Summary (Last 24 hours) at 04/06/2018 1217 Last data filed at 04/06/2018 1200 Gross per 24 hour  Intake 1268.33 ml  Output 315 ml  Net 953.33 ml   Filed Weights   04/04/18 0323 04/05/18 0333 04/06/18 0500  Weight: 62.6 kg (137 lb 14.4 oz) 63.8 kg (140 lb 10.5 oz) 69 kg (152 lb 1.9 oz)    Examination:  General exam: Appears calm and comfortable .  Sternal and  abdominal VAC place.  Draining orange-colored urine. Respiratory system: Clear to auscultation. Respiratory effort normal. Cardiovascular system: S1 & S2 heard, RRR. No JVD, murmurs, rubs, gallops or clicks. No pedal edema. Gastrointestinal system: Abdomen is nondistended, soft and nontender. No organomegaly or masses felt. Normal bowel sounds heard. Central nervous system: Alert and oriented. No  focal neurological deficits. Extremities: Symmetric 5 x 5 power. Skin: No rashes, lesions or ulcers Psychiatry: Judgement and insight appear normal. Mood & affect appropriate.     Data Reviewed: I have personally reviewed following labs and imaging studies  CBC: Recent Labs  Lab 04/02/18 1916 04/03/18 0039 04/04/18 0223 04/04/18 0847 04/06/18 0331  WBC 15.3* 13.3* 9.9 11.2* 9.4  NEUTROABS 12.9*  --   --   --   --   HGB 10.1* 9.5* 8.4* 9.4* 8.3*  HCT 32.4* 30.3* 27.4* 30.1* 26.4*  MCV 81.0 80.6 81.1 80.7 80.5  PLT 272 260 220 224 557   Basic Metabolic Panel: Recent Labs  Lab 04/02/18 1916 04/04/18 0223 04/04/18 0847 04/05/18 0339 04/06/18 0331  NA 133* 134* 135 134* 135  K 4.1 4.8 3.8 4.1 4.2  CL 87* 89* 91* 90* 90*  CO2 33* 35* 30 30 32  GLUCOSE 176* 194* 154* 162* 167*  BUN 22* 33* 35* 40* 45*  CREATININE 1.16 2.41* 2.37* 3.34* 4.46*  CALCIUM 8.9 8.7* 8.6* 8.3* 8.3*   GFR: Estimated Creatinine Clearance: 12.4 mL/min (A) (by C-G formula based on SCr of 4.46 mg/dL (H)). Liver Function Tests: Recent Labs  Lab 04/02/18 1916 04/04/18 0847 04/06/18 0331  AST 23 35 21  ALT 16* 21 8*  ALKPHOS 129* 121 114  BILITOT 0.6 0.8 1.0  PROT 7.7 7.1 6.5  ALBUMIN 3.1* 2.9* 2.6*   No results for input(s): LIPASE, AMYLASE in the last 168 hours. No results for input(s): AMMONIA in the last 168 hours. Coagulation Profile: Recent Labs  Lab 04/03/18 0039 04/04/18 0223  INR 2.38 2.18   Cardiac Enzymes: No results for input(s): CKTOTAL, CKMB, CKMBINDEX, TROPONINI in the last 168 hours. BNP (last 3 results) No results for input(s): PROBNP in the last 8760 hours. HbA1C: No results for input(s): HGBA1C in the last 72 hours. CBG: Recent Labs  Lab 04/05/18 1059 04/05/18 1257 04/05/18 1706 04/05/18 2108 04/06/18 0813  GLUCAP 177* 172* 168* 148* 148*   Lipid Profile: No results for input(s): CHOL, HDL, LDLCALC, TRIG, CHOLHDL, LDLDIRECT in the last 72 hours. Thyroid  Function Tests: No results for input(s): TSH, T4TOTAL, FREET4, T3FREE, THYROIDAB in the last 72 hours. Anemia Panel: No results for input(s): VITAMINB12, FOLATE, FERRITIN, TIBC, IRON, RETICCTPCT in the last 72 hours. Sepsis Labs: Recent Labs  Lab 04/02/18 1928 04/03/18 0039  PROCALCITON  --  0.28  LATICACIDVEN 1.84 1.8    Recent Results (from the past 240 hour(s))  Culture, blood (Routine X 2) w Reflex to ID Panel     Status: None (Preliminary result)   Collection Time: 04/02/18  7:19 PM  Result Value Ref Range Status   Specimen Description BLOOD RIGHT ANTECUBITAL  Final   Special Requests   Final    BOTTLES DRAWN AEROBIC AND ANAEROBIC Blood Culture adequate volume   Culture   Final    NO GROWTH 4 DAYS Performed at [facility], [place] [address] [place], [place] [address]    Report Status PENDING  Incomplete  Culture, blood (Routine X 2) w Reflex to ID Panel     Status: None (Preliminary result)  Collection Time: 04/02/18 10:30 PM  Result Value Ref Range Status   Specimen Description BLOOD LEFT ANTECUBITAL  Final   Special Requests   Final    BOTTLES DRAWN AEROBIC AND ANAEROBIC Blood Culture results may not be optimal due to an inadequate volume of blood received in culture bottles   Culture   Final    NO GROWTH 3 DAYS Performed at [facility] [address], [place], [place] [address]    Report Status PENDING  Incomplete  MRSA PCR Screening     Status: None   Collection Time: 04/03/18 12:42 AM  Result Value Ref Range Status   MRSA by PCR NEGATIVE NEGATIVE Final    Comment:        The GeneXpert MRSA Assay (FDA approved for NASAL specimens only), is one component of a comprehensive MRSA colonization surveillance program. It is not intended to diagnose MRSA infection nor to guide or monitor treatment for MRSA infections. Performed at [facility], [place] [address] [place], [place] [address]   Aerobic/Anaerobic Culture (surgical/deep wound)      Status: None (Preliminary result)   Collection Time: 04/05/18  8:41 AM  Result Value Ref Range Status   Specimen Description WOUND  Final   Special Requests STERNAL WOUND  Final   Gram Stain   Final    MODERATE WBC PRESENT, PREDOMINANTLY PMN RARE GRAM POSITIVE COCCI IN CLUSTERS Performed at [facility], [place] [address] [place], [place] [address]    Culture MODERATE STAPHYLOCOCCUS AUREUS  Final   Report Status PENDING  Incomplete         Radiology Studies: Dg Chest 2 View  Result Date: 04/04/2018 CLINICAL DATA:  Pre op chest exam. Pt states he has an infection at the site of his previous pacemaker. He has a new pacemaker at his left shoulder. EXAM: CHEST - 2 VIEW COMPARISON:  Chest x-ray dated 12/28/2017. FINDINGS: Stable cardiomegaly. No confluent opacity to suggest a developing pneumonia. No pleural effusion or pneumothorax seen. No acute or suspicious osseous finding. LEFT chest wall pacemaker/ICD apparatus appears stable in position with intact lead. IMPRESSION: No active cardiopulmonary disease. No evidence of pneumonia or pulmonary edema. Stable cardiomegaly. Electronically Signed   By: Franki Cabot M.D.   On: 04/04/2018 22:36   Dg Chest Port 1 View  Result Date: 04/05/2018 CLINICAL DATA:  Status post sternal and abdominal wound debridement. Shortness of breath, cough EXAM: PORTABLE CHEST 1 VIEW COMPARISON:  04/04/2018 FINDINGS: Prior CABG. Left AICD remains in place, unchanged. Cardiomegaly. Low lung volumes without confluent opacity or effusion. No pneumothorax. IMPRESSION: Low lung volumes, cardiomegaly.  No active disease. Electronically Signed   By: Rolm Baptise M.D.   On: 04/05/2018 10:33        Scheduled Meds: . acetaminophen  1,000 mg Oral Q6H   Or  . acetaminophen (TYLENOL) oral liquid 160 mg/5 mL  1,000 mg Oral Q6H  . amiodarone  200 mg Oral Daily  . bisacodyl  10 mg Oral Daily  . bisoprolol  2.5 mg Oral Daily  . calcium-vitamin D  1 tablet Oral QHS  .  darbepoetin (ARANESP) injection - NON-DIALYSIS  100 mcg Subcutaneous Q Fri-1800  . docusate sodium  100 mg Oral QHS  . DULoxetine  30 mg Oral Daily  . feeding supplement (ENSURE ENLIVE)  237 mL Oral TID BM  . ferrous sulfate  325 mg Oral BID WC  . insulin aspart  0-9 Units Subcutaneous TID WC  .  insulin detemir  7 Units Subcutaneous BH-q7a  . multivitamin with minerals  1 tablet Oral Daily  . pantoprazole  40 mg Oral Daily  . polyethylene glycol  17 g Oral Daily  . pravastatin  40 mg Oral q1800  . rifampin  300 mg Oral BID WC  . senna-docusate  1 tablet Oral BID  . vitamin B-12  100 mcg Oral Daily   Continuous Infusions: . sodium chloride 75 mL/hr at 04/06/18 0957  .  ceFAZolin (ANCEF) IV Stopped (04/06/18 0801)     LOS: 4 days    Georgette Shell, MD [facility]  If 7PM-7AM, please contact night-coverage [email or web address] Password [facility] 04/06/2018, 12:17 PM

## 2018-04-06 NOTE — Progress Notes (Signed)
Unable to ambulate along the hallway due to feeling nauseous and pain in his back.

## 2018-04-06 NOTE — Care Management Important Message (Signed)
Important Message  Patient Details  Name: Victor [name] MRN: [id number] Date of Birth: 1936-09-23   Medicare Important Message Given:  Yes    Nayelli Inglis P Vergia Chea 04/06/2018, 2:55 PM

## 2018-04-06 NOTE — Progress Notes (Addendum)
MD paged due to patient continued feelings of nausea. Awaiting call back.  2003- Still continue to wait on MD to call back. No new orders noted. Patient continues to c/o nausea. No relief from 17:- dose that was given. Zofran ordered prn every 8 hours. Will cont to observe.   2007- New order noted,

## 2018-04-06 NOTE — Care Management Note (Addendum)
Case Management Note  Patient Details  Name: Victor [name] MRN: [id number] Date of Birth: 14-Nov-1936  Subjective/Objective:    Pt admitted with sternal area wound - pt is s/p CABG                 Action/Plan:  PTA independent from home.  Pt from home alone - states neighbors are support system.  Pt has walker, cane and scooter in the home.  Pt has home oxygen 3 liter continious supplied via [facility].  Plan is for pt to have I&D 5/17.  Pt may discharge with a wound vac - CM will continue to follow for discharge needs   Expected Discharge Date:                  Expected Discharge Plan:  [place]  In-House Referral:     Discharge planning Services  CM Consult  Post Acute Care Choice:    Choice offered to:     DME Arranged:    DME Agency:     HH Arranged:    [place] Agency:     Status of Service:     If discussed at [employer], dates discussed:    Additional Comments: 04/06/2018  Cardiothoracic PA informed CM that the plan will be for pt to return to OR for an additional debridement.  Per attending - service will revisit need for wound vac post upcoming I&D - CM requested that CM consult be written for DME when deemed appropriate.  Pt requested CM to return at another time to discuss [facility] agency  04/05/18 Pt is now s/p I&D - 2 wound vacs applied.  Pt requiring IV antibiotic - may need long term - [facility] following.  Determination has not been made regarding wound vac being needed at discharge (CM requested consult if wound vac will be necessary at discharge).  CM offered choice for [facility] including HHRN - pt will discuss with son and inform CM of choice of agency.    Maryclare Labrador, RN 04/06/2018, 9:07 AM

## 2018-04-07 LAB — GLUCOSE, CAPILLARY
GLUCOSE-CAPILLARY: 94 mg/dL (ref 65–99)
GLUCOSE-CAPILLARY: 124 mg/dL — AB (ref 65–99)
Glucose-Capillary: 84 mg/dL (ref 65–99)
Glucose-Capillary: 131 mg/dL — ABNORMAL HIGH (ref 65–99)

## 2018-04-07 LAB — CBC
HCT: 29.5 % — ABNORMAL LOW (ref 39.0–52.0)
Hemoglobin: 9.1 g/dL — ABNORMAL LOW (ref 13.0–17.0)
MCH: 25 pg — ABNORMAL LOW (ref 26.0–34.0)
MCHC: 30.8 g/dL (ref 30.0–36.0)
MCV: 81 fL (ref 78.0–100.0)
Platelets: 270 10*3/uL (ref 150–400)
RBC: 3.64 MIL/uL — ABNORMAL LOW (ref 4.22–5.81)
RDW: 15 % (ref 11.5–15.5)
WBC: 10.6 10*3/uL — ABNORMAL HIGH (ref 4.0–10.5)

## 2018-04-07 LAB — COMPREHENSIVE METABOLIC PANEL
ALT: <5 U/L — ABNORMAL LOW (ref 17–63)
AST: 20 U/L (ref 15–41)
Albumin: 2.7 g/dL — ABNORMAL LOW (ref 3.5–5.0)
Alkaline Phosphatase: 141 U/L — ABNORMAL HIGH (ref 38–126)
Anion gap: 13 (ref 5–15)
BUN: 43 mg/dL — ABNORMAL HIGH (ref 6–20)
CO2: 31 mmol/L (ref 22–32)
Calcium: 8.3 mg/dL — ABNORMAL LOW (ref 8.9–10.3)
Chloride: 95 mmol/L — ABNORMAL LOW (ref 101–111)
Creatinine, Ser: 4.38 mg/dL — ABNORMAL HIGH (ref 0.61–1.24)
GFR calc Af Amer: 13 mL/min — ABNORMAL LOW (ref >60)
GFR calc non Af Amer: 12 mL/min — ABNORMAL LOW (ref >60)
Glucose, Bld: 93 mg/dL (ref 65–99)
Potassium: 3.9 mmol/L (ref 3.5–5.1)
Sodium: 139 mmol/L (ref 135–145)
Total Bilirubin: 1 mg/dL (ref 0.3–1.2)
Total Protein: 6.7 g/dL (ref 6.5–8.1)

## 2018-04-07 LAB — CULTURE, BLOOD (ROUTINE X 2)
Culture: NO GROWTH
SPECIAL REQUESTS: ADEQUATE

## 2018-04-07 MED ORDER — PROMETHAZINE HCL 25 MG PO TABS: 12.5000 mg | ORAL_TABLET | Freq: Four times a day (QID) | ORAL | Status: DC | PRN

## 2018-04-07 MED ORDER — MIRTAZAPINE 7.5 MG PO TABS
7.5000 mg | ORAL_TABLET | Freq: Every day | ORAL | Status: DC
  Administered 2018-04-07 – 2018-04-12 (×6): 7.5 mg via ORAL
  Filled 2018-04-07 (×6): qty 1

## 2018-04-07 NOTE — Plan of Care (Signed)
Continue current care plan 

## 2018-04-07 NOTE — Progress Notes (Signed)
Subjective:  Hemodynamically stable- UOP inc at 700- crt stable. Some nausea still but was able to rest well last night.     Objective Vital signs in last 24 hours: Vitals:   04/06/18 2322 04/07/18 0339 04/07/18 0529 04/07/18 0740  BP: (!) 150/66 127/60  (!) 132/58  Pulse: 61 60  (!) 58  Resp: 20 15  12   Temp: 97.9 F (36.6 C) 98.1 F (36.7 C)  (!) 97.4 F (36.3 C)  TempSrc: Axillary Axillary  Oral  SpO2: 100% 100%  100%  Weight:   64.8 kg (142 lb 14.4 oz)   Height:       Weight change: -4.181 kg (-9 lb 3.5 oz)  Intake/Output Summary (Last 24 hours) at 04/07/2018 0856 Last data filed at 04/07/2018 0800 Gross per 24 hour  Intake 1498.75 ml  Output 995 ml  Net 503.75 ml    Assessment/ Plan: Pt is a 82 y.o. yo male multiple medical issues - EF30%- CKD 3 who was admitted on 04/02/2018 with infected wounds related to past ICD placement- s/p removal- I and D - c/b  AKI Assessment/Plan: 1. Renal- AKI- crt 1.16 prior- in the setting of contrast and surgery with some hemodynamic instability. UOP still marginal but improving and crt stable- hopefully plateauing - no acute indications for RRT at this time- cont to watch UOP and crt closely.  Mentions that his first wife was "killed with dialysis"  If it came to that I dont think he would do it but there is good possibility that it would only be short term if needed  2. HTN/vol- if anything is dry, not wet- getting 1/2 NS with sodium of 135- changed to NS at 75 per hour- will now stop as I think tank is full 3. Anemia- hgb dropping - have added  ESA- no iron in this sitation 4. Elytes- K and calc OK     Elena Cothern A    Labs: Basic Metabolic Panel: Recent Labs  Lab 04/05/18 0339 04/06/18 0331 04/07/18 0257  NA 134* 135 139  K 4.1 4.2 3.9  CL 90* 90* 95*  CO2 30 32 31  GLUCOSE 162* 167* 93  BUN 40* 45* 43*  CREATININE 3.34* 4.46* 4.38*  CALCIUM 8.3* 8.3* 8.3*   Liver Function Tests: Recent Labs  Lab 04/04/18 0847  04/06/18 0331 04/07/18 0257  AST 35 21 20  ALT 21 8* <5*  ALKPHOS 121 114 141*  BILITOT 0.8 1.0 1.0  PROT 7.1 6.5 6.7  ALBUMIN 2.9* 2.6* 2.7*   No results for input(s): LIPASE, AMYLASE in the last 168 hours. No results for input(s): AMMONIA in the last 168 hours. CBC: Recent Labs  Lab 04/02/18 1916 04/03/18 0039 04/04/18 0223 04/04/18 0847 04/06/18 0331 04/07/18 0257  WBC 15.3* 13.3* 9.9 11.2* 9.4 10.6*  NEUTROABS 12.9*  --   --   --   --   --   HGB 10.1* 9.5* 8.4* 9.4* 8.3* 9.1*  HCT 32.4* 30.3* 27.4* 30.1* 26.4* 29.5*  MCV 81.0 80.6 81.1 80.7 80.5 81.0  PLT 272 260 220 224 237 270   Cardiac Enzymes: No results for input(s): CKTOTAL, CKMB, CKMBINDEX, TROPONINI in the last 168 hours. CBG: Recent Labs  Lab 04/06/18 0813 04/06/18 1232 04/06/18 1655 04/06/18 2135 04/07/18 0742  GLUCAP 148* 128* 86 85 94    Iron Studies: No results for input(s): IRON, TIBC, TRANSFERRIN, FERRITIN in the last 72 hours. Studies/Results: Dg Chest Port 1 View  Result Date: 04/05/2018 CLINICAL DATA:  Status post sternal and abdominal wound debridement. Shortness of breath, cough EXAM: PORTABLE CHEST 1 VIEW COMPARISON:  04/04/2018 FINDINGS: Prior CABG. Left AICD remains in place, unchanged. Cardiomegaly. Low lung volumes without confluent opacity or effusion. No pneumothorax. IMPRESSION: Low lung volumes, cardiomegaly.  No active disease. Electronically Signed   By: Rolm Baptise M.D.   On: 04/05/2018 10:33   Medications: Infusions: . sodium chloride 75 mL/hr at 04/06/18 2218  .  ceFAZolin (ANCEF) IV Stopped (04/07/18 0535)    Scheduled Medications: . acetaminophen  1,000 mg Oral Q6H   Or  . acetaminophen (TYLENOL) oral liquid 160 mg/5 mL  1,000 mg Oral Q6H  . amiodarone  200 mg Oral Daily  . bisacodyl  10 mg Oral Daily  . bisoprolol  2.5 mg Oral Daily  . calcium-vitamin D  1 tablet Oral QHS  . darbepoetin (ARANESP) injection - NON-DIALYSIS  100 mcg Subcutaneous Q Fri-1800  .  docusate sodium  100 mg Oral QHS  . DULoxetine  30 mg Oral Daily  . feeding supplement (ENSURE ENLIVE)  237 mL Oral TID BM  . ferrous sulfate  325 mg Oral BID WC  . insulin aspart  0-9 Units Subcutaneous TID WC  . insulin detemir  7 Units Subcutaneous BH-q7a  . multivitamin with minerals  1 tablet Oral Daily  . pantoprazole  40 mg Oral Daily  . polyethylene glycol  17 g Oral Daily  . pravastatin  40 mg Oral q1800  . rifampin  300 mg Oral BID WC  . senna-docusate  1 tablet Oral BID  . vitamin B-12  100 mcg Oral Daily    have reviewed scheduled and prn medications.  Physical Exam: General: alert, frustrated Heart: RRR Lungs: mostly clear Abdomen: wound vac in sternum and LQ- otherwise belly soft Extremities: no edema     04/07/2018,8:56 AM  LOS: 5 days

## 2018-04-07 NOTE — Progress Notes (Signed)
Wound vac having alarm low pressure and vac blockage alert. Changed vac tubing (small round suction tubing) from top proximal sponge. Noted clot occluding tubing. Reapplied new tubing, did not remove black foam as just applied in OR yesterday by dr Lucianne Lei trigt. Wound vac now patent and function. No more alarms.

## 2018-04-07 NOTE — Progress Notes (Signed)
Patient ID: Victor [name], male   DOB: [birth year], 82 y.o.   MRN: [id number]      [address]       ,[place] [address]             [phone]                 2 Days Post-Op Procedure(s) (LRB): SUPERFICIAL STERNAL AND ABDOMINAL WOUND DEBRIDEMENT (N/A) APPLICATION OF WOUND VAC (N/A)  LOS: 5 days   Subjective: Patient not eating, says he has no appetite  Objective: Vital signs in last 24 hours: Patient Vitals for the past 24 hrs:  BP Temp Temp src Pulse Resp SpO2 Weight  04/07/18 0740 (!) 132/58 (!) 97.4 F (36.3 C) Oral (!) 58 12 100 % -  04/07/18 0529 - - - - - - 142 lb 14.4 oz (64.8 kg)  04/07/18 0339 127/60 98.1 F (36.7 C) Axillary 60 15 100 % -  04/06/18 2322 (!) 150/66 97.9 F (36.6 C) Axillary 61 20 100 % -  04/06/18 1942 133/73 97.7 F (36.5 C) Oral (!) 59 14 100 % -  04/06/18 1554 121/61 98.5 F (36.9 C) Oral (!) 57 17 100 % -  04/06/18 1100 (!) 136/59 97.8 F (36.6 C) Oral (!) 58 17 100 % -    Filed Weights   04/05/18 0333 04/06/18 0500 04/07/18 0529  Weight: 140 lb 10.5 oz (63.8 kg) 152 lb 1.9 oz (69 kg) 142 lb 14.4 oz (64.8 kg)    Hemodynamic parameters for last 24 hours:    Intake/Output from previous day: 05/17 0701 - 05/18 0700 In: 1448.8 [I.V.:1398.8; IV Piggyback:50] Out: 770 [Urine:700; Drains:70] Intake/Output this shift: Total I/O In: 50 [IV Piggyback:50] Out: 225 [Urine:225]  Scheduled Meds: . acetaminophen  1,000 mg Oral Q6H   Or  . acetaminophen (TYLENOL) oral liquid 160 mg/5 mL  1,000 mg Oral Q6H  . amiodarone  200 mg Oral Daily  . bisacodyl  10 mg Oral Daily  . bisoprolol  2.5 mg Oral Daily  . calcium-vitamin D  1 tablet Oral QHS  . darbepoetin (ARANESP) injection - NON-DIALYSIS  100 mcg Subcutaneous Q Fri-1800  . docusate sodium  100 mg Oral QHS  . DULoxetine  30 mg Oral Daily  . feeding supplement (ENSURE ENLIVE)  237 mL Oral TID BM  . ferrous sulfate  325 mg Oral BID WC  . insulin aspart  0-9 Units  Subcutaneous TID WC  . insulin detemir  7 Units Subcutaneous BH-q7a  . multivitamin with minerals  1 tablet Oral Daily  . pantoprazole  40 mg Oral Daily  . polyethylene glycol  17 g Oral Daily  . pravastatin  40 mg Oral q1800  . rifampin  300 mg Oral BID WC  . senna-docusate  1 tablet Oral BID  . vitamin B-12  100 mcg Oral Daily   Continuous Infusions: .  ceFAZolin (ANCEF) IV Stopped (04/07/18 0535)   PRN Meds:.acetaminophen, albuterol, guaiFENesin-dextromethorphan, hydrALAZINE, methocarbamol, nitroGLYCERIN, ondansetron (ZOFRAN) IV, oxyCODONE-acetaminophen, zolpidem  General appearance: alert, cooperative and appears older than stated age Heart: regular rate and rhythm, S1, S2 normal, no murmur, click, rub or gallop and Regular rhythm bradycardic Wound: Wound VAC's are in place and functioning properly  Lab Results: CBC: Recent Labs    04/06/18 0331 04/07/18 0257  WBC 9.4 10.6*  HGB 8.3* 9.1*  HCT 26.4* 29.5*  PLT 237 270   BMET:  Recent Labs    04/06/18 0331 04/07/18 0257  NA 135 139  K 4.2 3.9  CL 90* 95*  CO2 32 31  GLUCOSE 167* 93  BUN 45* 43*  CREATININE 4.46* 4.38*  CALCIUM 8.3* 8.3*    PT/INR: No results for input(s): LABPROT, INR in the last 72 hours.   Radiology No results found.   Assessment/Plan: S/P Procedure(s) (LRB): SUPERFICIAL STERNAL AND ABDOMINAL WOUND DEBRIDEMENT (N/A) APPLICATION OF WOUND VAC (N/A) Mobilize Wound care as addressed, patient has numerous medical problems including probable depression, poor nutritional status, chronic renal failure all hindrances to his progression Consider palliative care consultation  Grace Isaac MD 04/07/2018 10:04 AM

## 2018-04-07 NOTE — Progress Notes (Signed)
Pharmacist Heart Failure Core Measure Documentation  Assessment: Victor [name] has an EF documented as 30-35% on 01/03/18 by Echo.  Rationale: Heart failure patients with left ventricular systolic dysfunction (LVSD) and an EF < 40% should be prescribed an angiotensin converting enzyme inhibitor (ACEI) or angiotensin receptor blocker (ARB) at discharge unless a contraindication is documented in the medical record.  This patient is not currently on an ACEI or ARB for HF.  This note is being placed in the record in order to provide documentation that a contraindication to the use of these agents is present for this encounter.  ACE Inhibitor or Angiotensin Receptor Blocker is contraindicated (specify all that apply)  []   ACEI allergy AND ARB allergy []   Angioedema []   Moderate or severe aortic stenosis []   Hyperkalemia []   Hypotension []   Renal artery stenosis [x]   Worsening renal function, preexisting renal disease or dysfunction  Hildred Laser, PharmD Clinical Pharmacist 04/07/2018 11:25 AM

## 2018-04-07 NOTE — Progress Notes (Signed)
Pharmacy Antibiotic Note  Victor [name] is a 82 y.o. male admitted on 04/02/2018 with wound infection. 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   Pharmacy consulted to dose ancef (he is also on rifampin). Per ID plans for 6 weeks antibiotics  -SCr= 4.46>>4.38, CrCl ~ 12   Plan: Continue cefazolin 1g IV q12 hours Monitor clinical progress, c/s, renal function   Height: 5\' 7"  (170.2 cm) Weight: 142 lb 14.4 oz (64.8 kg) IBW/kg (Calculated) : 66.1  Temp (24hrs), Avg:97.9 F (36.6 C), Min:97.4 F (36.3 C), Max:98.5 F (36.9 C)  Recent Labs  Lab 04/02/18 1928 04/03/18 0039 04/04/18 0223 04/04/18 0847 04/05/18 0339 04/06/18 0331 04/07/18 0257  WBC  --  13.3* 9.9 11.2*  --  9.4 10.6*  CREATININE  --   --  2.41* 2.37* 3.34* 4.46* 4.38*  LATICACIDVEN 1.84 1.8  --   --   --   --   --     Estimated Creatinine Clearance: 12.3 mL/min (A) (by C-G formula based on SCr of 4.38 mg/dL (H)).    Allergies  Allergen Reactions  . Coreg [Carvedilol] Other (See Comments)    Shortness of breath ,fatigue ,dizzyness   . Lovastatin Other (See Comments)    CK elevation, Myalgias  . Ativan [Lorazepam] Other (See Comments)    Pt. had side effects   Hildred Laser, PharmD Clinical Pharmacist 04/07/2018 10:19 AM

## 2018-04-07 NOTE — Progress Notes (Signed)
PROGRESS NOTE    Victor [name]  VOH:[id number] DOB: [birth year] DOA: 04/02/2018 PCP: Cyndi Bender, PA-C    Brief Narrative:82 y.o.M with COPD on 3L home O2, CAD s/p CABG, CHF EF 30% with ICD, HTN, PVD, DM, stroke, and COPD not on CPAP who presents with sternal redness for 4 days.     Assessment & Plan:   Principal Problem:   Abdominal wall fluid collections Active Problems:   Type II diabetes mellitus with renal manifestations (HCC)   Automatic implantable cardioverter-defibrillator in situ   Chronic systolic CHF (congestive heart failure) (HCC)   Essential hypertension   GERD (gastroesophageal reflux disease)   Hx of CABG 1990   Stage 3 chronic kidney disease (HCC)   COPD (chronic obstructive pulmonary disease) (HCC)   Abscess of chest wall   Sepsis (HCC)   Atrial fibrillation, chronic (HCC)   HLD (hyperlipidemia)   Stroke (HCC)   CAD (coronary artery disease)   Fall   Protein-calorie malnutrition, severe  Sternal abscess/infected left upper quadrant rectus sheath hematoma at the site of prior AICD  Abdomen wall abscesss/p WOUND VAC TIMES 2. -Continue cefazolin -Continue rifampin -ID foll.CTS foll. Acute on chronickidney disease stage III?  Contrast-induced versus hemodynamic instability.  Slow IV hydration. Baseline creatinine 1.0-1.2.trippled.-Holddiuretics -vancomycin stopped. -Creatinine 4.38 today  Acute on chronic systolic and diastolic CHF-stable, he is not on ACE inhibitor or ARB due to AKI/ckd  Type 2 diabetes -Continuehome Levemir and SSI  Hypertension Coronary artery disease History of stroke -ContinueBB, statin  COPD Chronic respiratory failure 3 L home O2 No active disease.   Atrial fibrillation, permanent Chads 2 vascular 8. On Eliquis at home, currently held. -Continuebisoprolol and amiodarone -HoldEliquis, washout and then surgical debridement per CT surgery. -CHADS2Vasc high, RESUME ANTI COAG WHEN OK WITH SURGERY.   Unfortunately we cannot start him back on DOAC s As it is contraindicated with rifampin, will have to start him on Coumadin.  Appreciate pharmacy input. Other medications -Continue duloxetine  Anemia Unclear etiology -Continueiron, B12 supplements       DVT prophylaxis SCD Code Status: Full code Family Communication: No family available Disposition Plan:  Plan would be to DC to a skilled nursing facility when ready for discharge and patient lives alone will obtain PT evaluation Consultants:   Nephrology, cardiothoracic surgery  Procedures: Wound VAC x2 Antimicrobials: None, rifampin infectious disease following.  Subjective: Complains of nausea overnight no vomiting decreased appetite reported.   Objective: Vitals:   04/07/18 0339 04/07/18 0529 04/07/18 0740 04/07/18 1148  BP: 127/60  (!) 132/58 (!) 141/59  Pulse: 60  (!) 58 61  Resp: 15  12 14   Temp: 98.1 F (36.7 C)  (!) 97.4 F (36.3 C) (!) 97.5 F (36.4 C)  TempSrc: Axillary  Oral Axillary  SpO2: 100%  100% 100%  Weight:  64.8 kg (142 lb 14.4 oz)    Height:        Intake/Output Summary (Last 24 hours) at 04/07/2018 1240 Last data filed at 04/07/2018 1154 Gross per 24 hour  Intake 1273.75 ml  Output 1145 ml  Net 128.75 ml   Filed Weights   04/05/18 0333 04/06/18 0500 04/07/18 0529  Weight: 63.8 kg (140 lb 10.5 oz) 69 kg (152 lb 1.9 oz) 64.8 kg (142 lb 14.4 oz)    Examination:  General exam: Appears calm and comfortable  Respiratory system: Clear to auscultation. Respiratory effort normal. Cardiovascular system: S1 & S2 heard, RRR. No JVD, murmurs, rubs, gallops or clicks. No pedal  edema. Gastrointestinal system: Abdomen is nondistended, soft and nontender. No organomegaly or masses felt. Normal bowel sounds heard. Central nervous system: Alert and oriented. No focal neurological deficits. Extremities: Symmetric 5 x 5 power. Skin: No rashes, lesions or ulcers Psychiatry: Judgement and insight  appear normal. Mood & affect appropriate.     Data Reviewed: I have personally reviewed following labs and imaging studies  CBC: Recent Labs  Lab 04/02/18 1916 04/03/18 0039 04/04/18 0223 04/04/18 0847 04/06/18 0331 04/07/18 0257  WBC 15.3* 13.3* 9.9 11.2* 9.4 10.6*  NEUTROABS 12.9*  --   --   --   --   --   HGB 10.1* 9.5* 8.4* 9.4* 8.3* 9.1*  HCT 32.4* 30.3* 27.4* 30.1* 26.4* 29.5*  MCV 81.0 80.6 81.1 80.7 80.5 81.0  PLT 272 260 220 224 237 476   Basic Metabolic Panel: Recent Labs  Lab 04/04/18 0223 04/04/18 0847 04/05/18 0339 04/06/18 0331 04/07/18 0257  NA 134* 135 134* 135 139  K 4.8 3.8 4.1 4.2 3.9  CL 89* 91* 90* 90* 95*  CO2 35* 30 30 32 31  GLUCOSE 194* 154* 162* 167* 93  BUN 33* 35* 40* 45* 43*  CREATININE 2.41* 2.37* 3.34* 4.46* 4.38*  CALCIUM 8.7* 8.6* 8.3* 8.3* 8.3*   GFR: Estimated Creatinine Clearance: 12.3 mL/min (A) (by C-G formula based on SCr of 4.38 mg/dL (H)). Liver Function Tests: Recent Labs  Lab 04/02/18 1916 04/04/18 0847 04/06/18 0331 04/07/18 0257  AST 23 35 21 20  ALT 16* 21 8* <5*  ALKPHOS 129* 121 114 141*  BILITOT 0.6 0.8 1.0 1.0  PROT 7.7 7.1 6.5 6.7  ALBUMIN 3.1* 2.9* 2.6* 2.7*   No results for input(s): LIPASE, AMYLASE in the last 168 hours. No results for input(s): AMMONIA in the last 168 hours. Coagulation Profile: Recent Labs  Lab 04/03/18 0039 04/04/18 0223  INR 2.38 2.18   Cardiac Enzymes: No results for input(s): CKTOTAL, CKMB, CKMBINDEX, TROPONINI in the last 168 hours. BNP (last 3 results) No results for input(s): PROBNP in the last 8760 hours. HbA1C: No results for input(s): HGBA1C in the last 72 hours. CBG: Recent Labs  Lab 04/06/18 0813 04/06/18 1232 04/06/18 1655 04/06/18 2135 04/07/18 0742  GLUCAP 148* 128* 86 85 94   Lipid Profile: No results for input(s): CHOL, HDL, LDLCALC, TRIG, CHOLHDL, LDLDIRECT in the last 72 hours. Thyroid Function Tests: No results for input(s): TSH, T4TOTAL,  FREET4, T3FREE, THYROIDAB in the last 72 hours. Anemia Panel: No results for input(s): VITAMINB12, FOLATE, FERRITIN, TIBC, IRON, RETICCTPCT in the last 72 hours. Sepsis Labs: Recent Labs  Lab 04/02/18 1928 04/03/18 0039  PROCALCITON  --  0.28  LATICACIDVEN 1.84 1.8    Recent Results (from the past 240 hour(s))  Culture, blood (Routine X 2) w Reflex to ID Panel     Status: None   Collection Time: 04/02/18  7:19 PM  Result Value Ref Range Status   Specimen Description BLOOD RIGHT ANTECUBITAL  Final   Special Requests   Final    BOTTLES DRAWN AEROBIC AND ANAEROBIC Blood Culture adequate volume   Culture   Final    NO GROWTH 5 DAYS Performed at [facility], [address], [place], [place] [address]    Report Status 04/07/2018 FINAL  Final  Culture, blood (Routine X 2) w Reflex to ID Panel     Status: None (Preliminary result)   Collection Time: 04/02/18 10:30 PM  Result Value Ref Range Status  Specimen Description BLOOD LEFT ANTECUBITAL  Final   Special Requests   Final    BOTTLES DRAWN AEROBIC AND ANAEROBIC Blood Culture results may not be optimal due to an inadequate volume of blood received in culture bottles   Culture   Final    NO GROWTH 4 DAYS Performed at [facility], [place] [address], [place], [place] [address]    Report Status PENDING  Incomplete  MRSA PCR Screening     Status: None   Collection Time: 04/03/18 12:42 AM  Result Value Ref Range Status   MRSA by PCR NEGATIVE NEGATIVE Final    Comment:        The GeneXpert MRSA Assay (FDA approved for NASAL specimens only), is one component of a comprehensive MRSA colonization surveillance program. It is not intended to diagnose MRSA infection nor to guide or monitor treatment for MRSA infections. Performed at [facility], [place] [address] [place], [place] [address]   Aerobic/Anaerobic Culture (surgical/deep wound)     Status: None (Preliminary result)   Collection Time: 04/05/18  8:41 AM    Result Value Ref Range Status   Specimen Description WOUND  Final   Special Requests STERNAL WOUND  Final   Gram Stain   Final    MODERATE WBC PRESENT, PREDOMINANTLY PMN RARE GRAM POSITIVE COCCI IN CLUSTERS Performed at [facility], [place] [address], [place], [place] [address]    Culture   Final    MODERATE STAPHYLOCOCCUS AUREUS NO ANAEROBES ISOLATED; CULTURE IN PROGRESS FOR 5 DAYS    Report Status PENDING  Incomplete   Organism ID, Bacteria STAPHYLOCOCCUS AUREUS  Final      Susceptibility   Staphylococcus aureus - MIC*    CIPROFLOXACIN <=0.5 SENSITIVE Sensitive     ERYTHROMYCIN <=0.25 SENSITIVE Sensitive     GENTAMICIN <=0.5 SENSITIVE Sensitive     OXACILLIN <=0.25 SENSITIVE Sensitive     TETRACYCLINE <=1 SENSITIVE Sensitive     VANCOMYCIN 1 SENSITIVE Sensitive     TRIMETH/SULFA <=10 SENSITIVE Sensitive     CLINDAMYCIN <=0.25 SENSITIVE Sensitive     RIFAMPIN <=0.5 SENSITIVE Sensitive     Inducible Clindamycin NEGATIVE Sensitive     * MODERATE STAPHYLOCOCCUS AUREUS         Radiology Studies: No results found.      Scheduled Meds: . acetaminophen  1,000 mg Oral Q6H   Or  . acetaminophen (TYLENOL) oral liquid 160 mg/5 mL  1,000 mg Oral Q6H  . amiodarone  200 mg Oral Daily  . bisacodyl  10 mg Oral Daily  . bisoprolol  2.5 mg Oral Daily  . calcium-vitamin D  1 tablet Oral QHS  . darbepoetin (ARANESP) injection - NON-DIALYSIS  100 mcg Subcutaneous Q Fri-1800  . docusate sodium  100 mg Oral QHS  . DULoxetine  30 mg Oral Daily  . feeding supplement (ENSURE ENLIVE)  237 mL Oral TID BM  . ferrous sulfate  325 mg Oral BID WC  . insulin aspart  0-9 Units Subcutaneous TID WC  . insulin detemir  7 Units Subcutaneous BH-q7a  . mirtazapine  7.5 mg Oral QHS  . multivitamin with minerals  1 tablet Oral Daily  . pantoprazole  40 mg Oral Daily  . polyethylene glycol  17 g Oral Daily  . pravastatin  40 mg Oral q1800  . rifampin  300 mg Oral BID WC  . senna-docusate  1  tablet Oral BID  . vitamin B-12  100 mcg Oral  Daily   Continuous Infusions: .  ceFAZolin (ANCEF) IV Stopped (04/07/18 0535)     LOS: 5 days     Georgette Shell, MD [facility]  If 7PM-7AM, please contact night-coverage [email or web address] Password [email or web address] 04/07/2018, 12:40 PM

## 2018-04-08 LAB — TYPE AND SCREEN
ABO/RH(D): O NEG
Antibody Screen: POSITIVE
Donor AG Type: NEGATIVE
Donor AG Type: NEGATIVE
Unit division: 0
Unit division: 0

## 2018-04-08 LAB — GLUCOSE, CAPILLARY
GLUCOSE-CAPILLARY: 143 mg/dL — AB (ref 65–99)
Glucose-Capillary: 180 mg/dL — ABNORMAL HIGH (ref 65–99)
Glucose-Capillary: 155 mg/dL — ABNORMAL HIGH (ref 65–99)
Glucose-Capillary: 206 mg/dL — ABNORMAL HIGH (ref 65–99)

## 2018-04-08 LAB — RENAL FUNCTION PANEL
ANION GAP: 13 (ref 5–15)
Albumin: 2.5 g/dL — ABNORMAL LOW (ref 3.5–5.0)
BUN: 40 mg/dL — ABNORMAL HIGH (ref 6–20)
CHLORIDE: 97 mmol/L — AB (ref 101–111)
CO2: 28 mmol/L (ref 22–32)
Calcium: 8.1 mg/dL — ABNORMAL LOW (ref 8.9–10.3)
Creatinine, Ser: 3.82 mg/dL — ABNORMAL HIGH (ref 0.61–1.24)
GFR calc Af Amer: 16 mL/min — ABNORMAL LOW (ref >60)
GFR calc non Af Amer: 14 mL/min — ABNORMAL LOW (ref >60)
GLUCOSE: 161 mg/dL — AB (ref 65–99)
POTASSIUM: 3.6 mmol/L (ref 3.5–5.1)
Phosphorus: 3.9 mg/dL (ref 2.5–4.6)
Sodium: 138 mmol/L (ref 135–145)

## 2018-04-08 LAB — BPAM RBC
Blood Product Expiration Date: 201906112359
Blood Product Expiration Date: 201906112359
ISSUE DATE / TIME: 201905131550
ISSUE DATE / TIME: 201905131937
Unit Type and Rh: 9500
Unit Type and Rh: 9500

## 2018-04-08 LAB — CULTURE, BLOOD (ROUTINE X 2): CULTURE: NO GROWTH

## 2018-04-08 NOTE — Plan of Care (Signed)
  Problem: Education: Goal: Knowledge of General Education information will improve Outcome: Progressing   Problem: Health Behavior/Discharge Planning: Goal: Ability to manage health-related needs will improve Outcome: Progressing   Problem: Clinical Measurements: Goal: Ability to maintain clinical measurements within normal limits will improve Outcome: Progressing Goal: Will remain free from infection Outcome: Progressing   

## 2018-04-08 NOTE — Progress Notes (Signed)
Patient ID: Victor [name], male   DOB: [birth year], 82 y.o.   MRN: [id number]      [address]       [place] [address]             [phone]                 3 Days Post-Op Procedure(s) (LRB): SUPERFICIAL STERNAL AND ABDOMINAL WOUND DEBRIDEMENT (N/A) APPLICATION OF WOUND VAC (N/A)  LOS: 6 days   Subjective: Eating better today  Objective: Vital signs in last 24 hours: Patient Vitals for the past 24 hrs:  BP Temp Temp src Pulse Resp SpO2 Weight  04/08/18 0736 (!) 151/62 98.4 F (36.9 C) Oral 63 15 100 % -  04/08/18 0250 116/65 98.1 F (36.7 C) Oral 60 17 100 % 142 lb 6.7 oz (64.6 kg)  04/07/18 2308 (!) 136/56 97.8 F (36.6 C) Oral 63 17 100 % -  04/07/18 1937 (!) 133/55 97.8 F (36.6 C) Oral 62 18 100 % -  04/07/18 1556 (!) 119/38 98 F (36.7 C) Oral 64 18 100 % -  04/07/18 1148 (!) 141/59 (!) 97.5 F (36.4 C) Axillary 61 14 100 % -    Filed Weights   04/06/18 0500 04/07/18 0529 04/08/18 0250  Weight: 152 lb 1.9 oz (69 kg) 142 lb 14.4 oz (64.8 kg) 142 lb 6.7 oz (64.6 kg)    Hemodynamic parameters for last 24 hours:    Intake/Output from previous day: 05/18 0701 - 05/19 0700 In: 340 [P.O.:240; IV Piggyback:100] Out: 1375 [Urine:1375] Intake/Output this shift: Total I/O In: -  Out: 250 [Urine:250]  Scheduled Meds: . acetaminophen  1,000 mg Oral Q6H   Or  . acetaminophen (TYLENOL) oral liquid 160 mg/5 mL  1,000 mg Oral Q6H  . amiodarone  200 mg Oral Daily  . bisacodyl  10 mg Oral Daily  . bisoprolol  2.5 mg Oral Daily  . calcium-vitamin D  1 tablet Oral QHS  . darbepoetin (ARANESP) injection - NON-DIALYSIS  100 mcg Subcutaneous Q Fri-1800  . docusate sodium  100 mg Oral QHS  . DULoxetine  30 mg Oral Daily  . feeding supplement (ENSURE ENLIVE)  237 mL Oral TID BM  . ferrous sulfate  325 mg Oral BID WC  . insulin aspart  0-9 Units Subcutaneous TID WC  . insulin detemir  7 Units Subcutaneous BH-q7a  . mirtazapine  7.5 mg Oral QHS  .  multivitamin with minerals  1 tablet Oral Daily  . pantoprazole  40 mg Oral Daily  . polyethylene glycol  17 g Oral Daily  . pravastatin  40 mg Oral q1800  . rifampin  300 mg Oral BID WC  . senna-docusate  1 tablet Oral BID  . vitamin B-12  100 mcg Oral Daily   Continuous Infusions: .  ceFAZolin (ANCEF) IV 1 g (04/08/18 0528)   PRN Meds:.acetaminophen, albuterol, guaiFENesin-dextromethorphan, hydrALAZINE, methocarbamol, nitroGLYCERIN, ondansetron (ZOFRAN) IV, oxyCODONE-acetaminophen, promethazine  General appearance: alert and cooperative Wound: wound vac intact and functioning   Lab Results: CBC: Recent Labs    04/06/18 0331 04/07/18 0257  WBC 9.4 10.6*  HGB 8.3* 9.1*  HCT 26.4* 29.5*  PLT 237 270   BMET:  Recent Labs    04/07/18 0257 04/08/18 0314  NA 139 138  K 3.9 3.6  CL 95* 97*  CO2 31 28  GLUCOSE 93 161*  BUN 43* 40*  CREATININE 4.38* 3.82*  CALCIUM 8.3* 8.1*  PT/INR: No results for input(s): LABPROT, INR in the last 72 hours.   Radiology No results found.   Assessment/Plan: S/P Procedure(s) (LRB): SUPERFICIAL STERNAL AND ABDOMINAL WOUND DEBRIDEMENT (N/A) APPLICATION OF WOUND VAC (N/A) Mobilize Plan wound vc change tomorrow   Grace Isaac MD 04/08/2018 9:54 AM

## 2018-04-08 NOTE — Progress Notes (Addendum)
[facility] PROGRESS NOTE  Victor [name] JAS:[id number] DOB: [birth year] DOA: 04/02/2018  PCP: Cyndi Bender, PA-C  Brief History/Interval Summary: 82 year old Caucasian male with a past medical history of COPD on home oxygen, history of coronary artery disease status post CABG, chronic systolic CHF with a EF of 30% with ICD, hypertension, stroke presented with cellulitis over the sternal area.  He was seen by cardiothoracic surgery.  He also developed acute renal failure.  He was also seen by nephrology.  Reason for Visit: Acute on chronic kidney disease stage III  Consultants: Nephrology.  Cardiothoracic surgery.  Infectious disease.  Procedures:  Incision, drainage, excisional debridement of lower sternal and upper abdominal wounds from old external AICD system placed at outside hospital 25 years ago.  Wound VAC.  Antibiotics: Patient was on vancomycin which has been discontinued.  He is currently on cefazolin and rifampin.  Subjective/Interval History: Patient complains of pain over the wounds on his chest.  Denies any shortness of breath.  Reports poor appetite.  ROS: Denies any headaches.  Objective:  Vital Signs  Vitals:   04/07/18 1937 04/07/18 2308 04/08/18 0250 04/08/18 0736  BP: (!) 133/55 (!) 136/56 116/65 (!) 151/62  Pulse: 62 63 60 63  Resp: 18 17 17 15   Temp: 97.8 F (36.6 C) 97.8 F (36.6 C) 98.1 F (36.7 C) 98.4 F (36.9 C)  TempSrc: Oral Oral Oral Oral  SpO2: 100% 100% 100% 100%  Weight:   64.6 kg (142 lb 6.7 oz)   Height:        Intake/Output Summary (Last 24 hours) at 04/08/2018 1023 Last data filed at 04/08/2018 0741 Gross per 24 hour  Intake 290 ml  Output 1400 ml  Net -1110 ml   Filed Weights   04/06/18 0500 04/07/18 0529 04/08/18 0250  Weight: 69 kg (152 lb 1.9 oz) 64.8 kg (142 lb 14.4 oz) 64.6 kg (142 lb 6.7 oz)    General appearance: alert, cooperative, appears stated age and no distress Head: Normocephalic, without obvious  abnormality, atraumatic Resp: Diminished air entry at the bases.  Will effort at rest.  Few crackles at the bases.  No wheezing or rhonchi.  Cardio: regular rate and rhythm, S1, S2 normal, no murmur, click, rub or gallop GI: Wound VAC noted over the upper abdomen.  Bowel sounds are present.  Abdomen itself appears to be benign.  No masses organomegaly. Extremities: extremities normal, atraumatic, no cyanosis or edema Pulses: 2+ and symmetric Neurologic: Awake alert. No focal neurological deficits.  Lab Results:  Data Reviewed: I have personally reviewed following labs and imaging studies  CBC: Recent Labs  Lab 04/02/18 1916 04/03/18 0039 04/04/18 0223 04/04/18 0847 04/06/18 0331 04/07/18 0257  WBC 15.3* 13.3* 9.9 11.2* 9.4 10.6*  NEUTROABS 12.9*  --   --   --   --   --   HGB 10.1* 9.5* 8.4* 9.4* 8.3* 9.1*  HCT 32.4* 30.3* 27.4* 30.1* 26.4* 29.5*  MCV 81.0 80.6 81.1 80.7 80.5 81.0  PLT 272 260 220 224 237 419    Basic Metabolic Panel: Recent Labs  Lab 04/04/18 0847 04/05/18 0339 04/06/18 0331 04/07/18 0257 04/08/18 0314  NA 135 134* 135 139 138  K 3.8 4.1 4.2 3.9 3.6  CL 91* 90* 90* 95* 97*  CO2 30 30 32 31 28  GLUCOSE 154* 162* 167* 93 161*  BUN 35* 40* 45* 43* 40*  CREATININE 2.37* 3.34* 4.46* 4.38* 3.82*  CALCIUM 8.6* 8.3* 8.3* 8.3* 8.1*  PHOS  --   --   --   --  3.9    GFR: Estimated Creatinine Clearance: 14.1 mL/min (A) (by C-G formula based on SCr of 3.82 mg/dL (H)).  Liver Function Tests: Recent Labs  Lab 04/02/18 1916 04/04/18 0847 04/06/18 0331 04/07/18 0257 04/08/18 0314  AST 23 35 21 20  --   ALT 16* 21 8* <5*  --   ALKPHOS 129* 121 114 141*  --   BILITOT 0.6 0.8 1.0 1.0  --   PROT 7.7 7.1 6.5 6.7  --   ALBUMIN 3.1* 2.9* 2.6* 2.7* 2.5*     Coagulation Profile: Recent Labs  Lab 04/03/18 0039 04/04/18 0223  INR 2.38 2.18    CBG: Recent Labs  Lab 04/07/18 0742 04/07/18 1221 04/07/18 1729 04/07/18 2118 04/08/18 0737  GLUCAP 94  84 131* 124* 180*    Recent Results (from the past 240 hour(s))  Culture, blood (Routine X 2) w Reflex to ID Panel     Status: None   Collection Time: 04/02/18  7:19 PM  Result Value Ref Range Status   Specimen Description BLOOD RIGHT ANTECUBITAL  Final   Special Requests   Final    BOTTLES DRAWN AEROBIC AND ANAEROBIC Blood Culture adequate volume   Culture   Final    NO GROWTH 5 DAYS Performed at [facility], [place] [address], [place], [place] [address]    Report Status 04/07/2018 FINAL  Final  Culture, blood (Routine X 2) w Reflex to ID Panel     Status: None (Preliminary result)   Collection Time: 04/02/18 10:30 PM  Result Value Ref Range Status   Specimen Description BLOOD LEFT ANTECUBITAL  Final   Special Requests   Final    BOTTLES DRAWN AEROBIC AND ANAEROBIC Blood Culture results may not be optimal due to an inadequate volume of blood received in culture bottles   Culture   Final    NO GROWTH 4 DAYS Performed at [facility], [place] [address], [place], [place] [address]    Report Status PENDING  Incomplete  MRSA PCR Screening     Status: None   Collection Time: 04/03/18 12:42 AM  Result Value Ref Range Status   MRSA by PCR NEGATIVE NEGATIVE Final    Comment:        The GeneXpert MRSA Assay (FDA approved for NASAL specimens only), is one component of a comprehensive MRSA colonization surveillance program. It is not intended to diagnose MRSA infection nor to guide or monitor treatment for MRSA infections. Performed at [facility], [place] [address], [place], [place] [address]   Aerobic/Anaerobic Culture (surgical/deep wound)     Status: None (Preliminary result)   Collection Time: 04/05/18  8:41 AM  Result Value Ref Range Status   Specimen Description WOUND  Final   Special Requests STERNAL WOUND  Final   Gram Stain   Final    MODERATE WBC PRESENT, PREDOMINANTLY PMN RARE GRAM POSITIVE COCCI IN CLUSTERS Performed at [facility], [place]  [address] [place], [place] [address]    Culture   Final    MODERATE STAPHYLOCOCCUS AUREUS NO ANAEROBES ISOLATED; CULTURE IN PROGRESS FOR 5 DAYS    Report Status PENDING  Incomplete   Organism ID, Bacteria STAPHYLOCOCCUS AUREUS  Final      Susceptibility   Staphylococcus aureus - MIC*    CIPROFLOXACIN <=0.5 SENSITIVE Sensitive     ERYTHROMYCIN <=0.25 SENSITIVE Sensitive     GENTAMICIN <=0.5 SENSITIVE Sensitive     OXACILLIN <=0.25 SENSITIVE Sensitive  TETRACYCLINE <=1 SENSITIVE Sensitive     VANCOMYCIN 1 SENSITIVE Sensitive     TRIMETH/SULFA <=10 SENSITIVE Sensitive     CLINDAMYCIN <=0.25 SENSITIVE Sensitive     RIFAMPIN <=0.5 SENSITIVE Sensitive     Inducible Clindamycin NEGATIVE Sensitive     * MODERATE STAPHYLOCOCCUS AUREUS      Radiology Studies: No results found.   Medications:  Scheduled: . acetaminophen  1,000 mg Oral Q6H   Or  . acetaminophen (TYLENOL) oral liquid 160 mg/5 mL  1,000 mg Oral Q6H  . amiodarone  200 mg Oral Daily  . bisacodyl  10 mg Oral Daily  . bisoprolol  2.5 mg Oral Daily  . calcium-vitamin D  1 tablet Oral QHS  . darbepoetin (ARANESP) injection - NON-DIALYSIS  100 mcg Subcutaneous Q Fri-1800  . docusate sodium  100 mg Oral QHS  . DULoxetine  30 mg Oral Daily  . feeding supplement (ENSURE ENLIVE)  237 mL Oral TID BM  . ferrous sulfate  325 mg Oral BID WC  . insulin aspart  0-9 Units Subcutaneous TID WC  . insulin detemir  7 Units Subcutaneous BH-q7a  . mirtazapine  7.5 mg Oral QHS  . multivitamin with minerals  1 tablet Oral Daily  . pantoprazole  40 mg Oral Daily  . polyethylene glycol  17 g Oral Daily  . pravastatin  40 mg Oral q1800  . rifampin  300 mg Oral BID WC  . senna-docusate  1 tablet Oral BID  . vitamin B-12  100 mcg Oral Daily   Continuous: .  ceFAZolin (ANCEF) IV 1 g (04/08/18 0528)   JXB:[id number], albuterol, guaiFENesin-dextromethorphan, hydrALAZINE, methocarbamol, nitroGLYCERIN, ondansetron (ZOFRAN) IV,  oxyCODONE-acetaminophen, promethazine  Assessment/Plan:    Sternal and abdominal wall abscess/infected left upper quadrant rectus sheath hematoma at the site of prior AICD Patient seen by cardiothoracic surgery.  He was taken to the OR and underwent incision and drainage.  He has wound VAC in place.  Cultures positive for staph aureus.  Patient currently on cefazolin and rifampin.  Infectious disease following.  Patient also being followed by cardiothoracic surgery.  Acute on chronic kidney disease stage III Worsening renal function thought to be due to contrast versus hemodynamic instability.  Nephrology was consulted.  He was hydrated.  Urine output appears to have picked up in the last 24 hours.  Creatinine is slightly better today.  Baseline creatinine is around 1.2.  Continue to monitor for now.  Acute on chronic systolic and diastolic CHF EF is noted to be 30 to 35% based on echocardiogram from February.  Currently stable.  Not on ACE inhibitor or ARB due to acute kidney injury.  He is noted to be on bisoprolol.  Diabetes mellitus type 2 CBGs reasonably well controlled.  He is noted to be on sliding scale coverage and Levemir.  HbA1c was 8.6 in February.  History of coronary artery disease Stable.  Continue beta-blocker statin.  History of stroke Stable.  History of essential hypertension Monitor blood pressures closely.  History of COPD on home oxygen Not an active issue currently.  Continue to monitor closely.  History of permanent atrial fibrillation Patient is on Eliquis at home which is being held due to his renal failure.  Rate is reasonably well controlled.  Continue amiodarone and bisoprolol.  Chads 2 vascular score is 8.  He will need anticoagulation in the form of warfarin when cleared to do so by surgery.  Normocytic anemia Hemoglobin is stable.  Likely anemia of chronic  disease.  No overt blood loss is present.  Severe protein calorie malnutrition Encourage oral  intake.  Goals of care Patient with multiple medical issues.  Very slow to improve. Not been able to participate with physical therapy.  His appetite remains poor.  However his renal function is improving.  We will give another 24 to 48 hours to see how much he progresses.  And then it may be reasonable to involve palliative medicine to establish goals of care.  DVT Prophylaxis: SCDs    Code Status: Full code Family Communication: Family at bedside Disposition Plan: Continue management as outlined above.  PT evaluation.  Will likely need some form of rehab.     LOS: 6 days   [facility] Pager [phone] 04/08/2018, 10:23 AM  If 7PM-7AM, please contact night-coverage at [email or web address], password [facility]

## 2018-04-08 NOTE — Progress Notes (Signed)
Subjective:  Hemodynamically stable- UOP inc at 1300- crt improved. "they messed with that drain in my chest and it hurts"   Objective Vital signs in last 24 hours: Vitals:   04/07/18 1556 04/07/18 1937 04/07/18 2308 04/08/18 0250  BP: (!) 119/38 (!) 133/55 (!) 136/56 116/65  Pulse: 64 62 63 60  Resp: 18 18 17 17   Temp: 98 F (36.7 C) 97.8 F (36.6 C) 97.8 F (36.6 C) 98.1 F (36.7 C)  TempSrc: Oral Oral Oral Oral  SpO2: 100% 100% 100% 100%  Weight:    64.6 kg (142 lb 6.7 oz)  Height:       Weight change: -0.219 kg (-7.7 oz)  Intake/Output Summary (Last 24 hours) at 04/08/2018 0736 Last data filed at 04/08/2018 0250 Gross per 24 hour  Intake 340 ml  Output 1375 ml  Net -1035 ml    Assessment/ Plan: Pt is a 82 y.o. yo male multiple medical issues - EF30%- CKD 3 who was admitted on 04/02/2018 with infected wounds related to past ICD placement- s/p removal- I and D with wounds vacs - c/b  AKI Assessment/Plan: 1. Renal- AKI- crt 1.16 prior- in the setting of contrast /surgery with some hemodynamic instability. UOP improving and crt down so those are great signs - no indications for RRT at this time- cont to watch UOP and crt closely.  Mentions that his first wife was "killed with dialysis"  If it came to that I dont think he would do it but there is good possibility that it would only be short term if needed.  2. HTN/vol- if anything felt was dry-  getting hydration until I felt tank full- making good urine on no lasix  3. Anemia- hgb dropping - have added  ESA- no iron in this sitation 4. Elytes- K /calc/phos OK - may need k replacement soon but not today     Victor [name] A    Labs: Basic Metabolic Panel: Recent Labs  Lab 04/06/18 0331 04/07/18 0257 04/08/18 0314  NA 135 139 138  K 4.2 3.9 3.6  CL 90* 95* 97*  CO2 32 31 28  GLUCOSE 167* 93 161*  BUN 45* 43* 40*  CREATININE 4.46* 4.38* 3.82*  CALCIUM 8.3* 8.3* 8.1*  PHOS  --   --  3.9   Liver Function  Tests: Recent Labs  Lab 04/04/18 0847 04/06/18 0331 04/07/18 0257 04/08/18 0314  AST 35 21 20  --   ALT 21 8* <5*  --   ALKPHOS 121 114 141*  --   BILITOT 0.8 1.0 1.0  --   PROT 7.1 6.5 6.7  --   ALBUMIN 2.9* 2.6* 2.7* 2.5*   No results for input(s): LIPASE, AMYLASE in the last 168 hours. No results for input(s): AMMONIA in the last 168 hours. CBC: Recent Labs  Lab 04/02/18 1916 04/03/18 0039 04/04/18 0223 04/04/18 0847 04/06/18 0331 04/07/18 0257  WBC 15.3* 13.3* 9.9 11.2* 9.4 10.6*  NEUTROABS 12.9*  --   --   --   --   --   HGB 10.1* 9.5* 8.4* 9.4* 8.3* 9.1*  HCT 32.4* 30.3* 27.4* 30.1* 26.4* 29.5*  MCV 81.0 80.6 81.1 80.7 80.5 81.0  PLT 272 260 220 224 237 270   Cardiac Enzymes: No results for input(s): CKTOTAL, CKMB, CKMBINDEX, TROPONINI in the last 168 hours. CBG: Recent Labs  Lab 04/06/18 2135 04/07/18 0742 04/07/18 1221 04/07/18 1729 04/07/18 2118  GLUCAP 85 94 84 131* 124*    Iron Studies:  No results for input(s): IRON, TIBC, TRANSFERRIN, FERRITIN in the last 72 hours. Studies/Results: No results found. Medications: Infusions: .  ceFAZolin (ANCEF) IV 1 g (04/08/18 0528)    Scheduled Medications: . acetaminophen  1,000 mg Oral Q6H   Or  . acetaminophen (TYLENOL) oral liquid 160 mg/5 mL  1,000 mg Oral Q6H  . amiodarone  200 mg Oral Daily  . bisacodyl  10 mg Oral Daily  . bisoprolol  2.5 mg Oral Daily  . calcium-vitamin D  1 tablet Oral QHS  . darbepoetin (ARANESP) injection - NON-DIALYSIS  100 mcg Subcutaneous Q Fri-1800  . docusate sodium  100 mg Oral QHS  . DULoxetine  30 mg Oral Daily  . feeding supplement (ENSURE ENLIVE)  237 mL Oral TID BM  . ferrous sulfate  325 mg Oral BID WC  . insulin aspart  0-9 Units Subcutaneous TID WC  . insulin detemir  7 Units Subcutaneous BH-q7a  . mirtazapine  7.5 mg Oral QHS  . multivitamin with minerals  1 tablet Oral Daily  . pantoprazole  40 mg Oral Daily  . polyethylene glycol  17 g Oral Daily  .  pravastatin  40 mg Oral q1800  . rifampin  300 mg Oral BID WC  . senna-docusate  1 tablet Oral BID  . vitamin B-12  100 mcg Oral Daily    have reviewed scheduled and prn medications.  Physical Exam: General: alert- better  Heart: RRR Lungs: mostly clear Abdomen: wound vac in sternum and LQ- otherwise belly soft Extremities: no edema     04/08/2018,7:36 AM  LOS: 6 days

## 2018-04-09 DIAGNOSIS — E1165 Type 2 diabetes mellitus with hyperglycemia: Secondary | ICD-10-CM

## 2018-04-09 LAB — RENAL FUNCTION PANEL
ALBUMIN: 2.5 g/dL — AB (ref 3.5–5.0)
Anion gap: 10 (ref 5–15)
BUN: 31 mg/dL — ABNORMAL HIGH (ref 6–20)
CALCIUM: 8.5 mg/dL — AB (ref 8.9–10.3)
CO2: 32 mmol/L (ref 22–32)
Chloride: 99 mmol/L — ABNORMAL LOW (ref 101–111)
Creatinine, Ser: 2.89 mg/dL — ABNORMAL HIGH (ref 0.61–1.24)
GFR calc Af Amer: 22 mL/min — ABNORMAL LOW (ref >60)
GFR, EST NON AFRICAN AMERICAN: 19 mL/min — AB (ref >60)
Glucose, Bld: 91 mg/dL (ref 65–99)
Phosphorus: 3.5 mg/dL (ref 2.5–4.6)
Potassium: 3.8 mmol/L (ref 3.5–5.1)
Sodium: 141 mmol/L (ref 135–145)

## 2018-04-09 LAB — GLUCOSE, CAPILLARY
GLUCOSE-CAPILLARY: 179 mg/dL — AB (ref 65–99)
GLUCOSE-CAPILLARY: 146 mg/dL — AB (ref 65–99)
Glucose-Capillary: 103 mg/dL — ABNORMAL HIGH (ref 65–99)
Glucose-Capillary: 98 mg/dL (ref 65–99)

## 2018-04-09 MED ORDER — POLYETHYLENE GLYCOL 3350 17 G PO PACK
17.0000 g | PACK | Freq: Three times a day (TID) | ORAL | Status: DC
  Administered 2018-04-09 – 2018-04-11 (×6): 17 g via ORAL
  Filled 2018-04-09 (×7): qty 1

## 2018-04-09 MED ORDER — MORPHINE SULFATE (PF) 2 MG/ML IV SOLN
2.0000 mg | Freq: Once | INTRAVENOUS | Status: AC
  Administered 2018-04-09: 2 mg via INTRAVENOUS
  Filled 2018-04-09: qty 1

## 2018-04-09 MED ORDER — FLEET ENEMA 7-19 GM/118ML RE ENEM
1.0000 | ENEMA | Freq: Every day | RECTAL | Status: DC | PRN
  Administered 2018-04-09 – 2018-04-11 (×2): 1 via RECTAL
  Filled 2018-04-09 (×6): qty 1

## 2018-04-09 NOTE — Progress Notes (Signed)
Pt had a lot of pain due to changing dressing even though pt had morphine 2 mg IVP. Wound care nurse couldn't change dressing, TCTS team made aware of this matter. Pt will go to OR to change VAC dressing tomorrow. Pt didn't want to have lunch due to pain. Administered pain medication and pt was sleeping. Pt refused to have fleet enema today. Pt had dry heaves at 1550, administered zofran 4mg  IVP. Pt's son told that pt  c/o dyspnea, but SpO2 100% with O2 2L [place]. He was just draggy. Explained pt's son regarding this due to pain medications and antiemesis medications. Hold for pain medication. Pt's son concerned that pt's poor oral intake. Will address it tomorrow to doctor and will consult to dietician tomorrow. Removed F-cath at 1510 and hasn't voided yet. HS [employer]

## 2018-04-09 NOTE — Progress Notes (Addendum)
[facility]  Date of Admission:  04/02/2018   Total days of antibiotics 7         Day 6 Ancef        Day 6 Rifampin           Patient ID: Victor [name] is a 82 y.o. male with   Principal Problem:   MSSA (methicillin susceptible Staphylococcus aureus) infection Active Problems:   Automatic implantable cardioverter-defibrillator in situ   Abdominal wall fluid collections   Abscess of chest wall   Type II diabetes mellitus with renal manifestations (HCC)   Chronic systolic CHF (congestive heart failure) (HCC)   Essential hypertension   GERD (gastroesophageal reflux disease)   Hx of CABG 1990   Stage 3 chronic kidney disease (HCC)   COPD (chronic obstructive pulmonary disease) (HCC)   Sepsis (HCC)   Atrial fibrillation, chronic (HCC)   HLD (hyperlipidemia)   Stroke (HCC)   CAD (coronary artery disease)   Fall   Protein-calorie malnutrition, severe   . acetaminophen  1,000 mg Oral Q6H   Or  . acetaminophen (TYLENOL) oral liquid 160 mg/5 mL  1,000 mg Oral Q6H  . amiodarone  200 mg Oral Daily  . bisacodyl  10 mg Oral Daily  . bisoprolol  2.5 mg Oral Daily  . calcium-vitamin D  1 tablet Oral QHS  . darbepoetin (ARANESP) injection - NON-DIALYSIS  100 mcg Subcutaneous Q Fri-1800  . docusate sodium  100 mg Oral QHS  . DULoxetine  30 mg Oral Daily  . feeding supplement (ENSURE ENLIVE)  237 mL Oral TID BM  . ferrous sulfate  325 mg Oral BID WC  . insulin aspart  0-9 Units Subcutaneous TID WC  . insulin detemir  7 Units Subcutaneous BH-q7a  . mirtazapine  7.5 mg Oral QHS  .  morphine injection  2 mg Intravenous Once  . multivitamin with minerals  1 tablet Oral Daily  . pantoprazole  40 mg Oral Daily  . polyethylene glycol  17 g Oral TID  . pravastatin  40 mg Oral q1800  . rifampin  300 mg Oral BID WC  . senna-docusate  1 tablet Oral BID  . vitamin B-12  100 mcg Oral Daily    SUBJECTIVE: Feeling poorly still - abdominal  cramping/pains. No BM in "a week or two." Vomiting has resolved antiemetics on board. Eating better as well.   Allergies  Allergen Reactions  . Coreg [Carvedilol] Other (See Comments)    Shortness of breath ,fatigue ,dizzyness   . Lovastatin Other (See Comments)    CK elevation, Myalgias  . Ativan [Lorazepam] Other (See Comments)    Pt. had side effects    OBJECTIVE: Vitals:   04/09/18 0331 04/09/18 0806 04/09/18 0900 04/09/18 1123  BP: (!) 126/44 140/66  (!) 148/64  Pulse: (!) 58 65 65 65  Resp: 14 16 19  (!) 22  Temp: 97.7 F (36.5 C) (!) 97.4 F (36.3 C)  (!) 97.5 F (36.4 C)  TempSrc: Oral Oral  Oral  SpO2: 100% 100% 100% 100%  Weight:      Height:       Body mass index is 22.31 kg/m.  Physical Exam  Constitutional: He is oriented to person, place, and time.  Chronically ill appearing.   HENT:  Mouth/Throat: No oral lesions. Normal dentition. No dental caries.  Poor dentition with multiple carries and necrotic teeth.   Eyes: No scleral icterus.  Cardiovascular: Normal rate, regular rhythm and intact distal pulses.  Murmur heard. Pulmonary/Chest: Effort normal and breath sounds normal. No respiratory distress.  Abdominal: Soft. Bowel sounds are normal. He exhibits no distension. There is no tenderness. There is guarding (lower abdomen ).  Wound vac in place with good seal to abdomen and lower chest. Lower chest resolving surrounding erythema.   Lymphadenopathy:    He has no cervical adenopathy.  Neurological: He is alert and oriented to person, place, and time.  Skin: Skin is warm and dry. Capillary refill takes less than 2 seconds. No rash noted.  Psychiatric: He has a normal mood and affect. Judgment normal.  Vitals reviewed.   Lab Results Lab Results  Component Value Date   WBC 10.6 (H) 04/07/2018   HGB 9.1 (L) 04/07/2018   HCT 29.5 (L) 04/07/2018   MCV 81.0 04/07/2018   PLT 270 04/07/2018    Lab Results  Component Value Date   CREATININE 2.89 (H)  04/09/2018   BUN 31 (H) 04/09/2018   NA 141 04/09/2018   K 3.8 04/09/2018   CL 99 (L) 04/09/2018   CO2 32 04/09/2018    Lab Results  Component Value Date   ALT <5 (L) 04/07/2018   AST 20 04/07/2018   ALKPHOS 141 (H) 04/07/2018   BILITOT 1.0 04/07/2018     Microbiology: BCx 5/13 > NG  Surgical Culture 5/16 > pending   ASSESSMENT: 82 y.o. male with history of MSSA infection of rectus sheath muscle / intraabdominal abscess at the site of previous AICD generator. POD4 debridement of both sites. Due for a VAC change today. He will need 6 weeks of dual therapy IV cefazolin + rifampin 300 mg BID. Will need central access prior to D/C - would place PICC if OK with nephrology (seems as if he would not be willing to do longer term HD anyway per discussion with Victor [name]).    He does have at least one remaining wire - will continue Rifampin. GI upset improving with Phenergan.   Kidney function improving - will need to adjust Cefazolin dose.   Drug interactions with amiodarone and apixaban noted. Warfarin vs enoxaparin during 6 weeks on Rifampin therapy? Will defer to cardiology/surgery team.   Received bisacodyl today for bowel regimen. Also has been getting some miralax the last few days.   PLAN: 1. Would place PICC if OK with nephrology  2. OPAT orders as detailed below  3. Anticoagulation regimen for alternative to DOAC per cardiology/surgery team.    OPAT ORDERS:  Diagnosis: MSSA abscess / AICD retained wires   Culture Result: MSSA   Allergies  Allergen Reactions  . Coreg [Carvedilol] Other (See Comments)    Shortness of breath ,fatigue ,dizzyness   . Lovastatin Other (See Comments)    CK elevation, Myalgias  . Ativan [Lorazepam] Other (See Comments)    Pt. had side effects    Discharge antibiotics: Cefazolin 1gm Q12h   +  Rifampin PO 300 mg BID   Duration: 6 weeks   End Date: July 13th   Victor [name] and Maintenance Per Protocol _x_ Please pull PIC at completion  of IV antibiotics   Labs weekly while on IV antibiotics: _x_ CBC with differential _x_ CMP   Fax weekly labs to [phone]  Clinic Follow Up Appt: Dr. Baxter [name] / Victor Maryland, NP @ [facility] in 5 - 6 weeks  Victor Madeira, MSN, NP-C Victor [name] for [facility] Cell: [phone] Pager: [phone]  04/09/2018  11:39 AM

## 2018-04-09 NOTE — Consult Note (Signed)
[facility] Nurse wound consult note Reason for Consult: VAC change to sternal and abdominal wounds Wound type: I&D sites Patient had received IV pain medication prior to [facility] change attempt.  Patient tolerated removal of the drapes to the two sites and removal of the TRAC pads.  When I attempted to removal the sternal foam, the patient yelled out in pain and began to cry.  He stated that when he had his open heart surgery he didn't have as much pain as he is having with these wounds and my attempt to remove the pieces of sponge. I covered both wound sites with a piece of the VAC drape, but did not apply the sites to [facility] therapy.   I provided emotional support to the patient.  I talked via phone with Nicholes Rough, PA and explained the situation and the patient insisting he cannot bear the pain and that if they want the VAC dressings changed he wants to go to the OR.  I informed the PA that the sites are not to [facility] therapy, only covered with drape.  She will take the situation back to the medical team for a formulation of a plan for management. Val Riles, RN, MSN, CWOCN, CNS-BC, pager [phone]

## 2018-04-09 NOTE — Progress Notes (Signed)
[facility] PROGRESS NOTE  Victor [name] SNK:[id number] DOB: 01/18/1936 DOA: 04/02/2018  PCP: Cyndi Bender, PA-C  Brief History/Interval Summary: 82 year old Caucasian male with a past medical history of COPD on home oxygen, history of coronary artery disease status post CABG, chronic systolic CHF with a EF of 30% with ICD, hypertension, stroke presented with cellulitis over the sternal area.  He was seen by cardiothoracic surgery.  He also developed acute renal failure.  He was also seen by nephrology.  Reason for Visit: Acute on chronic kidney disease stage III  Consultants: Nephrology.  Cardiothoracic surgery.  Infectious disease.  Procedures:  Incision, drainage, excisional debridement of lower sternal and upper abdominal wounds from old external AICD system placed at outside hospital 25 years ago.  Wound VAC.  Antibiotics: Patient was on vancomycin which has been discontinued.  He is currently on cefazolin and rifampin.  Subjective/Interval History: Patient with on and off pain around the wound VAC sites on his chest and abdomen.  Denies any shortness of breath.  Appetite is improving.    ROS: Denies any nausea or vomiting  Objective:  Vital Signs  Vitals:   04/08/18 2333 04/09/18 0331 04/09/18 0806 04/09/18 0900  BP: 139/74 (!) 126/44 140/66   Pulse: (!) 59 (!) 58 65 65  Resp: 16 14 16 19   Temp: 98.2 F (36.8 C) 97.7 F (36.5 C) (!) 97.4 F (36.3 C)   TempSrc: Oral Oral Oral   SpO2: 100% 100% 100% 100%  Weight:      Height:        Intake/Output Summary (Last 24 hours) at 04/09/2018 1045 Last data filed at 04/09/2018 1042 Gross per 24 hour  Intake 100 ml  Output 1500 ml  Net -1400 ml   Filed Weights   04/06/18 0500 04/07/18 0529 04/08/18 0250  Weight: 69 kg (152 lb 1.9 oz) 64.8 kg (142 lb 14.4 oz) 64.6 kg (142 lb 6.7 oz)    General appearance: Awake alert.  In no distress. Resp: Improving air entry bilaterally.  Normal effort at rest.  Few  crackles of the bases.  No wheezing or rhonchi. Cardio: S1-S2 is normal and regular.  Systolic murmur appreciated over the precordium. GI: Abdomen is soft.  Wound VAC noted over the upper abdomen and chest.  Nontender.  No masses or organomegaly.   Extremities: No edema Neurologic: Alert.  Oriented to place a year.  No obvious focal neurological deficits..  Lab Results:  Data Reviewed: I have personally reviewed following labs and imaging studies  CBC: Recent Labs  Lab 04/02/18 1916 04/03/18 0039 04/04/18 0223 04/04/18 0847 04/06/18 0331 04/07/18 0257  WBC 15.3* 13.3* 9.9 11.2* 9.4 10.6*  NEUTROABS 12.9*  --   --   --   --   --   HGB 10.1* 9.5* 8.4* 9.4* 8.3* 9.1*  HCT 32.4* 30.3* 27.4* 30.1* 26.4* 29.5*  MCV 81.0 80.6 81.1 80.7 80.5 81.0  PLT 272 260 220 224 237 937    Basic Metabolic Panel: Recent Labs  Lab 04/05/18 0339 04/06/18 0331 04/07/18 0257 04/08/18 0314 04/09/18 0313  NA 134* 135 139 138 141  K 4.1 4.2 3.9 3.6 3.8  CL 90* 90* 95* 97* 99*  CO2 30 32 31 28 32  GLUCOSE 162* 167* 93 161* 91  BUN 40* 45* 43* 40* 31*  CREATININE 3.34* 4.46* 4.38* 3.82* 2.89*  CALCIUM 8.3* 8.3* 8.3* 8.1* 8.5*  PHOS  --   --   --  3.9 3.5  GFR: Estimated Creatinine Clearance: 18.6 mL/min (A) (by C-G formula based on SCr of 2.89 mg/dL (H)).  Liver Function Tests: Recent Labs  Lab 04/02/18 1916 04/04/18 0847 04/06/18 0331 04/07/18 0257 04/08/18 0314 04/09/18 0313  AST 23 35 21 20  --   --   ALT 16* 21 8* <5*  --   --   ALKPHOS 129* 121 114 141*  --   --   BILITOT 0.6 0.8 1.0 1.0  --   --   PROT 7.7 7.1 6.5 6.7  --   --   ALBUMIN 3.1* 2.9* 2.6* 2.7* 2.5* 2.5*     Coagulation Profile: Recent Labs  Lab 04/03/18 0039 04/04/18 0223  INR 2.38 2.18    CBG: Recent Labs  Lab 04/08/18 0737 04/08/18 1227 04/08/18 1712 04/08/18 2104 04/09/18 0812  GLUCAP 180* 155* 206* 143* 103*    Recent Results (from the past 240 hour(s))  Culture, blood (Routine X 2) w  Reflex to ID Panel     Status: None   Collection Time: 04/02/18  7:19 PM  Result Value Ref Range Status   Specimen Description BLOOD RIGHT ANTECUBITAL  Final   Special Requests   Final    BOTTLES DRAWN AEROBIC AND ANAEROBIC Blood Culture adequate volume   Culture   Final    NO GROWTH 5 DAYS Performed at [facility], [place] [address], [place], [place] [address]    Report Status 04/07/2018 FINAL  Final  Culture, blood (Routine X 2) w Reflex to ID Panel     Status: None   Collection Time: 04/02/18 10:30 PM  Result Value Ref Range Status   Specimen Description BLOOD LEFT ANTECUBITAL  Final   Special Requests   Final    BOTTLES DRAWN AEROBIC AND ANAEROBIC Blood Culture results may not be optimal due to an inadequate volume of blood received in culture bottles   Culture   Final    NO GROWTH 5 DAYS Performed at [facility], [place] [address] [place], [place] [address]    Report Status 04/08/2018 FINAL  Final  MRSA PCR Screening     Status: None   Collection Time: 04/03/18 12:42 AM  Result Value Ref Range Status   MRSA by PCR NEGATIVE NEGATIVE Final    Comment:        The GeneXpert MRSA Assay (FDA approved for NASAL specimens only), is one component of a comprehensive MRSA colonization surveillance program. It is not intended to diagnose MRSA infection nor to guide or monitor treatment for MRSA infections. Performed at [facility], [place] [address] [place], [place] [address]   Aerobic/Anaerobic Culture (surgical/deep wound)     Status: None (Preliminary result)   Collection Time: 04/05/18  8:41 AM  Result Value Ref Range Status   Specimen Description WOUND  Final   Special Requests STERNAL WOUND  Final   Gram Stain   Final    MODERATE WBC PRESENT, PREDOMINANTLY PMN RARE GRAM POSITIVE COCCI IN CLUSTERS Performed at [facility], [place] [address] [place], [place] [address]    Culture   Final    MODERATE STAPHYLOCOCCUS AUREUS NO ANAEROBES ISOLATED; CULTURE  IN PROGRESS FOR 5 DAYS    Report Status PENDING  Incomplete   Organism ID, Bacteria STAPHYLOCOCCUS AUREUS  Final      Susceptibility   Staphylococcus aureus - MIC*    CIPROFLOXACIN <=0.5 SENSITIVE Sensitive     ERYTHROMYCIN <=0.25 SENSITIVE Sensitive  GENTAMICIN <=0.5 SENSITIVE Sensitive     OXACILLIN <=0.25 SENSITIVE Sensitive     TETRACYCLINE <=1 SENSITIVE Sensitive     VANCOMYCIN 1 SENSITIVE Sensitive     TRIMETH/SULFA <=10 SENSITIVE Sensitive     CLINDAMYCIN <=0.25 SENSITIVE Sensitive     RIFAMPIN <=0.5 SENSITIVE Sensitive     Inducible Clindamycin NEGATIVE Sensitive     * MODERATE STAPHYLOCOCCUS AUREUS      Radiology Studies: No results found.   Medications:  Scheduled: . acetaminophen  1,000 mg Oral Q6H   Or  . acetaminophen (TYLENOL) oral liquid 160 mg/5 mL  1,000 mg Oral Q6H  . amiodarone  200 mg Oral Daily  . bisacodyl  10 mg Oral Daily  . bisoprolol  2.5 mg Oral Daily  . calcium-vitamin D  1 tablet Oral QHS  . darbepoetin (ARANESP) injection - NON-DIALYSIS  100 mcg Subcutaneous Q Fri-1800  . docusate sodium  100 mg Oral QHS  . DULoxetine  30 mg Oral Daily  . feeding supplement (ENSURE ENLIVE)  237 mL Oral TID BM  . ferrous sulfate  325 mg Oral BID WC  . insulin aspart  0-9 Units Subcutaneous TID WC  . insulin detemir  7 Units Subcutaneous BH-q7a  . mirtazapine  7.5 mg Oral QHS  . multivitamin with minerals  1 tablet Oral Daily  . pantoprazole  40 mg Oral Daily  . polyethylene glycol  17 g Oral Daily  . pravastatin  40 mg Oral q1800  . rifampin  300 mg Oral BID WC  . senna-docusate  1 tablet Oral BID  . vitamin B-12  100 mcg Oral Daily   Continuous: .  ceFAZolin (ANCEF) IV 1 g (04/09/18 7782)   UMP:[id number], albuterol, guaiFENesin-dextromethorphan, hydrALAZINE, methocarbamol, nitroGLYCERIN, ondansetron (ZOFRAN) IV, oxyCODONE-acetaminophen, promethazine  Assessment/Plan:     Sternal and abdominal wall abscess/infected left upper quadrant  rectus sheath hematoma at the site of prior AICD Patient seen by cardiothoracic surgery. He was taken to the OR and underwent incision and drainage. He has wound VAC in place. Cultures positive for staph aureus. Patient currently on cefazolin and rifampin. Infectious disease following. Patient also being followed by cardiothoracic surgery.  Acute on chronic kidney disease stage III Worsening renal function thought to be due to contrast versus hemodynamic instability. Nephrology was consulted. He was hydrated.  Urine output has improved in the last 2 days.  Creatinine is improving as well.  Baseline creatinine is around 1.2.  Continue to monitor for now.  Could remove his Foley catheter.  Acute on chronic systolic and diastolic CHF EF is noted to be 30 to 35% based on echocardiogram from February. Currently stable. Not on ACE inhibitor or ARB due to acute kidney injury. He is noted to be on bisoprolol.  Diabetes mellitus type 2 CBGs reasonably well controlled. He is noted to be on sliding scale coverage and Levemir. HbA1c was 8.6 in February.  History of coronary artery disease Stable. Continue beta-blocker statin.  History of stroke Stable.  History of essential hypertension Blood pressure is reasonably well controlled.  Continue to monitor closely.  History of COPD on home oxygen Not an active issue currently. Continue to monitor closely.  History of permanent atrial fibrillation Patient is on Eliquis at home which is being held due to his renal failure. Rate is reasonably well controlled. Continue amiodarone and bisoprolol. Chads 2 vascular score is 8. He will need anticoagulation in the form of warfarin when cleared to do so by surgery.  Normocytic anemia  Hemoglobin is stable. Likely anemia of chronic disease. No overt blood loss is present.  Severe protein calorie malnutrition Encourage oral intake.  Continues to have poor appetite slightly better today.  Goals of care Patient  with multiple medical issues.  Has been slow to improve.  Not been able to participate with physical therapy. His appetite remains poor. However his renal function is improving. We will give another 24 to 48 hours to see how much he progresses. And then it may be reasonable to involve palliative medicine to establish goals of care if he does not progress.  However patient noted to be in better spirits today.  He is willing to work with physical therapy.  DVT Prophylaxis: SCDs    Code Status: Full code Family Communication: Discussed with patient.  No family at bedside. Disposition Plan: Continue management as outlined above.  PT evaluation.  Will likely need some form of rehab.     LOS: 7 days   [facility] Pager [phone] 04/09/2018, 10:45 AM  If 7PM-7AM, please contact night-coverage at [email or web address], password [facility]

## 2018-04-09 NOTE — Progress Notes (Signed)
[facility] Rounding Note  Background: 82 y.o. yo male, PMH ICM (ICD), EF 30%,AF, COPD (chronic nocturnal O2), CAD, prior CABG, DM, CVA and baseline CKD3 (creatinine 1.16 04/02/18). Admitted with wound infection related to past ICD and required surgical debridement of sternal and abd wounds and VAC placement. Asked to see because of AKI (hypotension, IV contrast X2 on 5/13, surgery), ABLA requiring transfusion. Peak creatinine 4.46 on 5/17 with improvement since.   Assessment/Plan: 1. AKI on CKD3. Baseline 1.16 prior to contrast/surgery/hemodynamic instability. UOP picking up substantially and for 2nd day in a row significant improvement in creatinine. I believe has escaped the potential need for dialysis. Not on any lasix, UOP great. Electrolytes are stable. (Urine range from Rifampin) 1. I would have no opposition to foley removal, continue strict I and O 2. Continue to monitor daily renal function panel 3. Monitor Hb - rec'd a dose of Aranesp, Hb is 9.1, no IV Fe 2/2 infection, should not require outpt dosing at time of discharge 4. Renal will sign off at this time - please call if there are further issues  Jamal Maes, MD [place] Pager 04/09/2018, 9:57 AM  Subjective:   UOP good, creatinine falling Feels pretty good Says wound VAC changes "like to killed me" No SOB  Objective Vital signs in last 24 hours: Vitals:   04/08/18 1907 04/08/18 2333 04/09/18 0331 04/09/18 0806  BP: (!) 158/69 139/74 (!) 126/44 140/66  Pulse: 61 (!) 59 (!) 58 65  Resp: 16 16 14 16   Temp: (!) 97.5 F (36.4 C) 98.2 F (36.8 C) 97.7 F (36.5 C) (!) 97.4 F (36.3 C)  TempSrc: Oral Oral Oral Oral  SpO2: 100% 100% 100% 100%  Weight:      Height:       Weight change:   Intake/Output Summary (Last 24 hours) at 04/09/2018 0937 Last data filed at 04/09/2018 0600 Gross per 24 hour  Intake 100 ml  Output 1100 ml  Net -1000 ml   Physical Exam: Awake, alert, NAD 2 wound VACS in  place (sternal and abdominal) No JVD Lungs clear Regular S1S2 No S3 Other than VAC abd not tender No LE edema Urine in foley catheter is orange (I presume from Rifampin)   Recent Labs  Lab 04/07/18 0257 04/08/18 0314 04/09/18 0313  NA 139 138 141  K 3.9 3.6 3.8  CL 95* 97* 99*  CO2 31 28 32  GLUCOSE 93 161* 91  BUN 43* 40* 31*  CREATININE 4.38* 3.82* 2.89*  CALCIUM 8.3* 8.1* 8.5*  PHOS  --  3.9 3.5    Recent Labs  Lab 04/04/18 0847 04/06/18 0331 04/07/18 0257 04/08/18 0314 04/09/18 0313  AST 35 21 20  --   --   ALT 21 8* <5*  --   --   ALKPHOS 121 114 141*  --   --   BILITOT 0.8 1.0 1.0  --   --   PROT 7.1 6.5 6.7  --   --   ALBUMIN 2.9* 2.6* 2.7* 2.5* 2.5*    Recent Labs  Lab 04/02/18 1916 04/03/18 0039 04/04/18 0223 04/04/18 0847 04/06/18 0331 04/07/18 0257  WBC 15.3* 13.3* 9.9 11.2* 9.4 10.6*  NEUTROABS 12.9*  --   --   --   --   --   HGB 10.1* 9.5* 8.4* 9.4* 8.3* 9.1*  HCT 32.4* 30.3* 27.4* 30.1* 26.4* 29.5*  MCV 81.0 80.6 81.1 80.7 80.5 81.0  PLT 272 260 220 224 237 270  Recent Labs  Lab 04/08/18 0737 04/08/18 1227 04/08/18 1712 04/08/18 2104 04/09/18 0812  GLUCAP 180* 155* 206* 143* 103*   Medications: Infusions: .  ceFAZolin (ANCEF) IV 1 g (04/09/18 9935)    Scheduled Medications: . acetaminophen  1,000 mg Oral Q6H   Or  . acetaminophen (TYLENOL) oral liquid 160 mg/5 mL  1,000 mg Oral Q6H  . amiodarone  200 mg Oral Daily  . bisacodyl  10 mg Oral Daily  . bisoprolol  2.5 mg Oral Daily  . calcium-vitamin D  1 tablet Oral QHS  . darbepoetin (ARANESP) injection - NON-DIALYSIS  100 mcg Subcutaneous Q Fri-1800  . docusate sodium  100 mg Oral QHS  . DULoxetine  30 mg Oral Daily  . feeding supplement (ENSURE ENLIVE)  237 mL Oral TID BM  . ferrous sulfate  325 mg Oral BID WC  . insulin aspart  0-9 Units Subcutaneous TID WC  . insulin detemir  7 Units Subcutaneous BH-q7a  . mirtazapine  7.5 mg Oral QHS  . multivitamin with minerals   1 tablet Oral Daily  . pantoprazole  40 mg Oral Daily  . polyethylene glycol  17 g Oral Daily  . pravastatin  40 mg Oral q1800  . rifampin  300 mg Oral BID WC  . senna-docusate  1 tablet Oral BID  . vitamin B-12  100 mcg Oral Daily   Jamal Maes, MD [facility] [phone] Pager 04/09/2018, 9:47 AM

## 2018-04-09 NOTE — Progress Notes (Signed)
PT Cancellation Note  Patient Details Name: Victor [name] MRN: [id number] DOB: [birth year]   Cancelled Treatment:    Reason Eval/Treat Not Completed: Other (comment). Attempted around 10am, pt politely asked if PT could come back as he just got done bathing and has had a rough morning. Returned at 1200, wound RN coming to change dressing and pt asked to wait due to being in pain and knowing the wound RN is coming. Pt with noted depressed spirits compared to initial eval. Acute PT to return as able.   Kittie Plater, PT, DPT Pager #: [phone] Office #: [phone]    [place] 04/09/2018, 12:08 PM

## 2018-04-09 NOTE — Progress Notes (Signed)
      [address]       [place] [address]             [phone]    I had a conversation with wound care this morning who stated  that Mr. Clingerman cannot tolerate his wound vac being changed at the bedside. We tried IV pain medication but the patient still is refusing to have his wound vac changed due to pain. He is even refusing having it hooked back to suction. He is requesting to go back to the OR if we need to change the wound vac.   Discussed the patient with Dr. Prescott Gum who states that the patient would be changed to wet-to-dry dressing changes.   Nicholes Rough, PA-C

## 2018-04-09 NOTE — Anesthesia Preprocedure Evaluation (Addendum)
Anesthesia Evaluation  Patient identified by MRN, date of birth, ID band Patient awake    Reviewed: Allergy & Precautions, H&P , NPO status , Patient's Chart, lab work & pertinent test results  Airway Mallampati: III  TM Distance: >3 FB Neck ROM: Full    Dental no notable dental hx. (+) Edentulous Upper, Partial Lower, Dental Advisory Given   Pulmonary COPD, former smoker,    Pulmonary exam normal breath sounds clear to auscultation       Cardiovascular Exercise Tolerance: Good hypertension, Pt. on medications and Pt. on home beta blockers + CAD, + Past MI, + CABG, + Peripheral Vascular Disease and +CHF  + Cardiac Defibrillator  Rhythm:Regular Rate:Normal + Systolic murmurs    Neuro/Psych TIACVA negative psych ROS   GI/Hepatic Neg liver ROS, GERD  ,  Endo/Other  diabetes, Insulin Dependent  Renal/GU Renal disease  negative genitourinary   Musculoskeletal  (+) Arthritis ,   Abdominal   Peds  Hematology negative hematology ROS (+) anemia ,   Anesthesia Other Findings   Reproductive/Obstetrics negative OB ROS                            Anesthesia Physical Anesthesia Plan  ASA: IV  Anesthesia Plan: MAC   Post-op Pain Management:    Induction: Intravenous  PONV Risk Score and Plan: 2 and Ondansetron and Propofol infusion  Airway Management Planned: Simple Face Mask  Additional Equipment:   Intra-op Plan:   Post-operative Plan:   Informed Consent: I have reviewed the patients History and Physical, chart, labs and discussed the procedure including the risks, benefits and alternatives for the proposed anesthesia with the patient or authorized representative who has indicated his/her understanding and acceptance.   Dental advisory given  Plan Discussed with: CRNA  Anesthesia Plan Comments:         Anesthesia Quick Evaluation

## 2018-04-09 NOTE — Progress Notes (Addendum)
      [address]       [place] [address]             [phone]      4 Days Post-Op Procedure(s) (LRB): SUPERFICIAL STERNAL AND ABDOMINAL WOUND DEBRIDEMENT (N/A) APPLICATION OF WOUND VAC (N/A) Subjective: No issues overnight. He is having a lot of pain around the wound vac sites  Objective: Vital signs in last 24 hours: Temp:  [97.5 F (36.4 C)-98.2 F (36.8 C)] 97.7 F (36.5 C) (05/20 0331) Pulse Rate:  [58-61] 58 (05/20 0331) Cardiac Rhythm: Normal sinus rhythm;Bundle branch block;Heart block (05/20 0331) Resp:  [14-16] 14 (05/20 0331) BP: (126-158)/(44-74) 126/44 (05/20 0331) SpO2:  [100 %] 100 % (05/20 0331)     Intake/Output from previous day: 05/19 0701 - 05/20 0700 In: 240 [P.O.:120; IV Piggyback:100] Out: 1350 [Urine:1350] Intake/Output this shift: No intake/output data recorded.  General appearance: alert, cooperative and no distress Heart: regular rate and rhythm, S1, S2 normal, no murmur, click, rub or gallop Lungs: clear to auscultation bilaterally Abdomen: soft, non-tender; bowel sounds normal; no masses,  no organomegaly Extremities: extremities normal, atraumatic, no cyanosis or edema Wound: wound vac with good suction  Lab Results: Recent Labs    04/07/18 0257  WBC 10.6*  HGB 9.1*  HCT 29.5*  PLT 270   BMET:  Recent Labs    04/08/18 0314 04/09/18 0313  NA 138 141  K 3.6 3.8  CL 97* 99*  CO2 28 32  GLUCOSE 161* 91  BUN 40* 31*  CREATININE 3.82* 2.89*  CALCIUM 8.1* 8.5*    PT/INR: No results for input(s): LABPROT, INR in the last 72 hours. ABG    Component Value Date/Time   PHART 7.420 04/06/2018 0415   HCO3 31.4 (H) 04/06/2018 0415   TCO2 32 03/20/2015 1638   O2SAT 97.9 04/06/2018 0415   CBG (last 3)  Recent Labs    04/08/18 1227 04/08/18 1712 04/08/18 2104  GLUCAP 155* 206* 143*    Assessment/Plan: S/P Procedure(s) (LRB): SUPERFICIAL STERNAL AND ABDOMINAL WOUND DEBRIDEMENT (N/A) APPLICATION OF  WOUND VAC (N/A)  1. Sinus brady, BP stable 2. Wound vac change planned for today 3. Nausea has improved. No more vomiting  Plan: Will likely need medicated before wound vac change today.    LOS: 7 days    Elgie Collard 04/09/2018   because of pain and patient preference the wound VAC sponges will be changed in the OR tomorrow under MAC anesthesia Cont IV Ancef- wound growing MSSA  patient examined and medical record reviewed,agree with above note. Tharon Aquas Trigt III 04/09/2018

## 2018-04-10 ENCOUNTER — Encounter (HOSPITAL_COMMUNITY): Payer: Self-pay | Admitting: Certified Registered"

## 2018-04-10 ENCOUNTER — Encounter (HOSPITAL_COMMUNITY)
Admission: EM | Disposition: A | Discharge status: Nursing Home (Includes ICF) | Payer: Self-pay | Source: Home / Self Care | Attending: Internal Medicine

## 2018-04-10 ENCOUNTER — Inpatient Hospital Stay (HOSPITAL_COMMUNITY): Payer: Medicare Other | Admitting: Anesthesiology

## 2018-04-10 HISTORY — PX: APPLICATION OF WOUND VAC: SHX5189

## 2018-04-10 LAB — AEROBIC/ANAEROBIC CULTURE W GRAM STAIN (SURGICAL/DEEP WOUND)

## 2018-04-10 LAB — RENAL FUNCTION PANEL
ALBUMIN: 2.5 g/dL — AB (ref 3.5–5.0)
Anion gap: 11 (ref 5–15)
BUN: 21 mg/dL — AB (ref 6–20)
CALCIUM: 8.4 mg/dL — AB (ref 8.9–10.3)
CO2: 28 mmol/L (ref 22–32)
CREATININE: 1.92 mg/dL — AB (ref 0.61–1.24)
Chloride: 101 mmol/L (ref 101–111)
GFR calc Af Amer: 36 mL/min — ABNORMAL LOW (ref >60)
GFR calc non Af Amer: 31 mL/min — ABNORMAL LOW (ref >60)
GLUCOSE: 162 mg/dL — AB (ref 65–99)
PHOSPHORUS: 3.2 mg/dL (ref 2.5–4.6)
POTASSIUM: 4 mmol/L (ref 3.5–5.1)
Sodium: 140 mmol/L (ref 135–145)

## 2018-04-10 LAB — CBC
HCT: 31.6 % — ABNORMAL LOW (ref 39.0–52.0)
HEMOGLOBIN: 9.3 g/dL — AB (ref 13.0–17.0)
MCH: 25 pg — AB (ref 26.0–34.0)
MCHC: 29.4 g/dL — AB (ref 30.0–36.0)
MCV: 84.9 fL (ref 78.0–100.0)
PLATELETS: 267 10*3/uL (ref 150–400)
RBC: 3.72 MIL/uL — ABNORMAL LOW (ref 4.22–5.81)
RDW: 15.7 % — AB (ref 11.5–15.5)
WBC: 10.7 10*3/uL — ABNORMAL HIGH (ref 4.0–10.5)

## 2018-04-10 LAB — GLUCOSE, CAPILLARY
GLUCOSE-CAPILLARY: 118 mg/dL — AB (ref 65–99)
GLUCOSE-CAPILLARY: 158 mg/dL — AB (ref 65–99)
Glucose-Capillary: 159 mg/dL — ABNORMAL HIGH (ref 65–99)
Glucose-Capillary: 139 mg/dL — ABNORMAL HIGH (ref 65–99)
Glucose-Capillary: 260 mg/dL — ABNORMAL HIGH (ref 65–99)

## 2018-04-10 SURGERY — APPLICATION, WOUND VAC
Anesthesia: Monitor Anesthesia Care | Site: Chest

## 2018-04-10 MED ORDER — ONDANSETRON HCL 4 MG/2ML IJ SOLN
INTRAMUSCULAR | Status: DC | PRN
  Administered 2018-04-10: 4 mg via INTRAVENOUS

## 2018-04-10 MED ORDER — LIDOCAINE HCL (PF) 1 % IJ SOLN
INTRAMUSCULAR | Status: AC
  Filled 2018-04-10: qty 30

## 2018-04-10 MED ORDER — FENTANYL CITRATE (PF) 100 MCG/2ML IJ SOLN: 25.0000 ug | INTRAMUSCULAR | Status: DC | PRN

## 2018-04-10 MED ORDER — ONDANSETRON HCL 4 MG/2ML IJ SOLN
INTRAMUSCULAR | Status: AC
  Filled 2018-04-10: qty 2

## 2018-04-10 MED ORDER — VANCOMYCIN HCL 1000 MG IV SOLR
INTRAVENOUS | Status: AC
  Filled 2018-04-10: qty 1000

## 2018-04-10 MED ORDER — LIDOCAINE HCL 1 % IJ SOLN
INTRAMUSCULAR | Status: DC | PRN
  Administered 2018-04-10: 30 mL

## 2018-04-10 MED ORDER — VANCOMYCIN HCL 1000 MG IV SOLR
INTRAVENOUS | Status: AC
  Administered 2018-04-10: 1000 mL
  Filled 2018-04-10: qty 1000

## 2018-04-10 MED ORDER — FENTANYL CITRATE (PF) 250 MCG/5ML IJ SOLN
INTRAMUSCULAR | Status: DC | PRN
  Administered 2018-04-10: 25 ug via INTRAVENOUS

## 2018-04-10 MED ORDER — PROPOFOL 10 MG/ML IV BOLUS
INTRAVENOUS | Status: DC | PRN
  Administered 2018-04-10 (×5): 20 mg via INTRAVENOUS

## 2018-04-10 MED ORDER — PHENYLEPHRINE 40 MCG/ML (10ML) SYRINGE FOR IV PUSH (FOR BLOOD PRESSURE SUPPORT)
PREFILLED_SYRINGE | INTRAVENOUS | Status: AC
  Filled 2018-04-10: qty 10

## 2018-04-10 MED ORDER — FENTANYL CITRATE (PF) 250 MCG/5ML IJ SOLN
INTRAMUSCULAR | Status: AC
  Filled 2018-04-10: qty 5

## 2018-04-10 MED ORDER — CEFAZOLIN SODIUM-DEXTROSE 2-4 GM/100ML-% IV SOLN
2.0000 g | Freq: Two times a day (BID) | INTRAVENOUS | Status: DC
  Administered 2018-04-10 – 2018-04-12 (×4): 2 g via INTRAVENOUS
  Filled 2018-04-10 (×4): qty 100

## 2018-04-10 MED ORDER — PHENYLEPHRINE 40 MCG/ML (10ML) SYRINGE FOR IV PUSH (FOR BLOOD PRESSURE SUPPORT)
PREFILLED_SYRINGE | INTRAVENOUS | Status: DC | PRN
Start: 2018-04-10 — End: 2018-04-10
  Administered 2018-04-10 (×7): 80 ug via INTRAVENOUS

## 2018-04-10 MED ORDER — SODIUM CHLORIDE 0.9 % IR SOLN
Status: DC | PRN
  Administered 2018-04-10: 1000 mL

## 2018-04-10 MED ORDER — LACTATED RINGERS IV SOLN
INTRAVENOUS | Status: DC | PRN
  Administered 2018-04-10: 08:00:00 via INTRAVENOUS

## 2018-04-10 MED ORDER — SODIUM CHLORIDE 0.9 % IV SOLN
INTRAVENOUS | Status: AC
  Filled 2018-04-10: qty 500000

## 2018-04-10 MED ORDER — PROPOFOL 500 MG/50ML IV EMUL
INTRAVENOUS | Status: DC | PRN
Start: 2018-04-10 — End: 2018-04-10
  Administered 2018-04-10: 100 ug/kg/min via INTRAVENOUS

## 2018-04-10 SURGICAL SUPPLY — 62 items
APL SKNCLS STERI-STRIP NONHPOA (GAUZE/BANDAGES/DRESSINGS)
BENZOIN TINCTURE PRP APPL 2/3 (GAUZE/BANDAGES/DRESSINGS) IMPLANT
BLADE SURG 10 STRL SS (BLADE) IMPLANT
BLADE SURG 15 STRL LF DISP TIS (BLADE) IMPLANT
BLADE SURG 15 STRL SS (BLADE)
BNDG GAUZE ELAST 4 BULKY (GAUZE/BANDAGES/DRESSINGS) IMPLANT
CANISTER SUCT 3000ML PPV (MISCELLANEOUS) ×3 IMPLANT
CLIP VESOCCLUDE SM WIDE 24/CT (CLIP) IMPLANT
CONT SPEC 4OZ CLIKSEAL STRL BL (MISCELLANEOUS) IMPLANT
DRAPE LAPAROSCOPIC ABDOMINAL (DRAPES) ×3 IMPLANT
DRAPE SLUSH/WARMER DISC (DRAPES) IMPLANT
DRSG CUTIMED SORBACT 7X9 (GAUZE/BANDAGES/DRESSINGS) ×2 IMPLANT
DRSG PAD ABDOMINAL 8X10 ST (GAUZE/BANDAGES/DRESSINGS) IMPLANT
DRSG VAC ATS LRG SENSATRAC (GAUZE/BANDAGES/DRESSINGS) ×1 IMPLANT
DRSG VAC ATS MED SENSATRAC (GAUZE/BANDAGES/DRESSINGS) ×1 IMPLANT
DRSG VAC ATS SM SENSATRAC (GAUZE/BANDAGES/DRESSINGS) ×3 IMPLANT
ELECT REM PT RETURN 9FT ADLT (ELECTROSURGICAL) ×3
ELECTRODE REM PT RTRN 9FT ADLT (ELECTROSURGICAL) ×1 IMPLANT
GAUZE SPONGE 4X4 12PLY STRL (GAUZE/BANDAGES/DRESSINGS) IMPLANT
GAUZE SPONGE 4X4 12PLY STRL LF (GAUZE/BANDAGES/DRESSINGS) ×2 IMPLANT
GAUZE XEROFORM 5X9 LF (GAUZE/BANDAGES/DRESSINGS) IMPLANT
GLOVE BIO SURGEON STRL SZ7.5 (GLOVE) ×6 IMPLANT
GLOVE BIOGEL PI IND STRL 8 (GLOVE) IMPLANT
GLOVE BIOGEL PI INDICATOR 8 (GLOVE) ×4
GLOVE ECLIPSE 8.0 STRL XLNG CF (GLOVE) ×4 IMPLANT
GOWN STRL REUS W/ TWL LRG LVL3 (GOWN DISPOSABLE) ×2 IMPLANT
GOWN STRL REUS W/TWL 2XL LVL3 (GOWN DISPOSABLE) ×2 IMPLANT
GOWN STRL REUS W/TWL LRG LVL3 (GOWN DISPOSABLE) ×6
HANDPIECE INTERPULSE COAX TIP (DISPOSABLE) ×3
HEMOSTAT POWDER SURGIFOAM 1G (HEMOSTASIS) IMPLANT
HEMOSTAT SURGICEL 2X14 (HEMOSTASIS) IMPLANT
KIT BASIN OR (CUSTOM PROCEDURE TRAY) ×3 IMPLANT
KIT SUCTION CATH 14FR (SUCTIONS) IMPLANT
KIT TURNOVER KIT B (KITS) ×3 IMPLANT
MATRIX WOUND 3-LAYER 5X5 (Tissue) ×2 IMPLANT
MICROMATRIX 500MG (Tissue) ×6 IMPLANT
NDL 18GX1X1/2 (RX/OR ONLY) (NEEDLE) IMPLANT
NDL HYPO 25GX1X1/2 BEV (NEEDLE) IMPLANT
NEEDLE 18GX1X1/2 (RX/OR ONLY) (NEEDLE) ×3 IMPLANT
NEEDLE HYPO 25GX1X1/2 BEV (NEEDLE) ×3 IMPLANT
NS IRRIG 1000ML POUR BTL (IV SOLUTION) ×3 IMPLANT
PACK GENERAL/GYN (CUSTOM PROCEDURE TRAY) ×3 IMPLANT
PAD ARMBOARD 7.5X6 YLW CONV (MISCELLANEOUS) ×6 IMPLANT
SET HNDPC FAN SPRY TIP SCT (DISPOSABLE) ×1 IMPLANT
SOLUTION PARTIC MCRMTRX 500MG (Tissue) IMPLANT
STAPLER VISISTAT 35W (STAPLE) IMPLANT
STRAP MONTGOMERY 1.25X11-1/8 (MISCELLANEOUS) IMPLANT
SUT ETHILON 3 0 FSL (SUTURE) IMPLANT
SUT VIC AB 1 CTX 36 (SUTURE)
SUT VIC AB 1 CTX36XBRD ANBCTR (SUTURE) IMPLANT
SUT VIC AB 2-0 CTX 27 (SUTURE) IMPLANT
SUT VIC AB 3-0 SH 8-18 (SUTURE) ×2 IMPLANT
SUT VIC AB 3-0 X1 27 (SUTURE) IMPLANT
SWAB COLLECTION DEVICE MRSA (MISCELLANEOUS) IMPLANT
SWAB CULTURE ESWAB REG 1ML (MISCELLANEOUS) IMPLANT
SYR 20CC LL (SYRINGE) ×2 IMPLANT
TOWEL GREEN STERILE (TOWEL DISPOSABLE) ×3 IMPLANT
TOWEL GREEN STERILE FF (TOWEL DISPOSABLE) ×3 IMPLANT
TRAY FOLEY MTR SLVR 16FR STAT (SET/KITS/TRAYS/PACK) IMPLANT
WATER STERILE IRR 1000ML POUR (IV SOLUTION) ×3 IMPLANT
WND VAC CANISTER 500ML (MISCELLANEOUS) ×3 IMPLANT
WOUND MATRIX 3-LAYER 5X5 (Tissue) ×2 IMPLANT

## 2018-04-10 NOTE — Transfer of Care (Signed)
Immediate Anesthesia Transfer of Care Note  Patient: Victor [name]  Procedure(s) Performed: WOUND VAC CHANGE (N/A Chest)  Patient Location: PACU  Anesthesia Type:MAC  Level of Consciousness: drowsy  Airway & Oxygen Therapy: Patient Spontanous Breathing and Patient connected to nasal cannula oxygen  Post-op Assessment: Report given to RN and Post -op Vital signs reviewed and stable  Post vital signs: Reviewed and stable  Last Vitals:  Vitals Value Taken Time  BP 129/57 04/10/2018  9:15 AM  Temp    Pulse 55 04/10/2018  9:15 AM  Resp 13 04/10/2018  9:15 AM  SpO2 99 % 04/10/2018  9:15 AM  Vitals shown include unvalidated device data.  Last Pain:  Vitals:   04/10/18 0602  TempSrc: Oral  PainSc:       Patients Stated Pain Goal: 4 (73/53/29 9242)  Complications: No apparent anesthesia complications

## 2018-04-10 NOTE — Op Note (Signed)
NAME: Victor [name], Victor [name] MEDICAL RECORD [id number] ACCOUNT [id number] DATE OF BIRTH:12/28/1935 FACILITY: [facility] LOCATION: [facility] PHYSICIAN:Shelva Hetzer VAN TRIGT III, MD  OPERATIVE REPORT  DATE OF PROCEDURE:  04/10/2018  OPERATION:   1.  Wound vacuum assisted closure device change of lower sternal and upper abdominal wounds. 2.  Pulse lavage therapy to each wound. 3.  Application of ACell powder and sheet and wet-to-dry dressings on each wound.  SURGEON:  Tharon Aquas Tright III, MD  PREOPERATIVE DIAGNOSIS:    Superficial infections of a lower sternal and upper abdominal wound with methicillin sensitive Staphylococcus aureus and a previous automatic implantable cardiac defibrillator epicardial system site.  POSTOPERATIVE DIAGNOSES:  Superficial infections of a lower sternal and upper abdominal wound with methicillin sensitive Staphylococcus aureus and a previous automatic implantable cardiac defibrillator epicardial system site.  ANESTHESIA:  MAC with 1% local lidocaine.  SURGEON:  Ivin Poot III, M.D.  DESCRIPTION OF PROCEDURE:  The patient was brought from preop holding where the procedure was reviewed with the patient and informed consent documented.  The patient was placed on the operating table.  IV conscious sedation was provided and monitored by  the anesthesia team.  The previous VAC sponges were removed from each wound.  The chest and abdomen were prepped and draped as a sterile field.  A proper time-out was performed.  Lidocaine 1% was infiltrated into the margins of each wound.  Next, each wound was irrigated with 500 mL of vancomycin irrigation using the pulse lavage system.  Next excisional debridement of the edges was performed as needed, but overall the wound was noted to be with over 90% granulation tissue and no purulence.  Next ACell powder 500 mg was applied to the base of each wound.  This was then covered with a sterile sheet and a sheet of Sorbact.  The sheets were  tacked to the corners of the wounds with 3-0 Vicryl sutures.  Next, each wound was packed with saline wet to dry 4 x 4 gauzes and covered with dry gauze and sterile tape.  The patient was then taken back to recovery room by anesthesia after having tolerated the procedure well.  AN/NUANCE  D:04/10/2018 T:04/10/2018 JOB:[id number]

## 2018-04-10 NOTE — Progress Notes (Signed)
PHARMACY CONSULT NOTE FOR:  OUTPATIENT  PARENTERAL ANTIBIOTIC THERAPY (OPAT)  Indication: MSSA abscess / AICD retained wires  Regimen: Cefazolin 2g q12 hours End date: 06/02/18  IV antibiotic discharge orders are pended. To discharging provider:  please sign these orders via discharge navigator,  Select New Orders & click on the button choice - Manage This Unsigned Work.     Thank you for allowing pharmacy to be a part of this patient's care.  Erin Hearing PharmD., BCPS Clinical Pharmacist 04/10/2018 8:28 PM

## 2018-04-10 NOTE — Progress Notes (Signed)
Pre Procedure note for inpatients:   Victor [name] has been scheduled for Procedure(s): WOUND VAC CHANGE (N/A) today. The various methods of treatment have been discussed with the patient. After consideration of the risks, benefits and treatment options the patient has consented to the planned procedure.   The patient has been seen and labs reviewed. There are no changes in the patient's condition to prevent proceeding with the planned procedure today.  Recent labs:  Lab Results  Component Value Date   WBC 10.7 (H) 04/10/2018   HGB 9.3 (L) 04/10/2018   HCT 31.6 (L) 04/10/2018   PLT 267 04/10/2018   GLUCOSE 162 (H) 04/10/2018   CHOL 127 06/29/2015   TRIG 111 06/29/2015   HDL 36 (L) 06/29/2015   LDLCALC 69 06/29/2015   ALT <5 (L) 04/07/2018   AST 20 04/07/2018   NA 140 04/10/2018   K 4.0 04/10/2018   CL 101 04/10/2018   CREATININE 1.92 (H) 04/10/2018   BUN 21 (H) 04/10/2018   CO2 28 04/10/2018   TSH 0.411 12/07/2017   INR 2.18 04/04/2018   HGBA1C 8.6 (H) 12/26/2017    Len Childs, MD 04/10/2018 7:58 AM

## 2018-04-10 NOTE — Anesthesia Procedure Notes (Signed)
Procedure Name: MAC Date/Time: 04/10/2018 8:19 AM Performed by: Imagene Riches, CRNA Pre-anesthesia Checklist: Patient identified, Emergency Drugs available, Suction available and Patient being monitored Patient Re-evaluated:Patient Re-evaluated prior to induction Oxygen Delivery Method: Simple face mask Preoxygenation: Pre-oxygenation with 100% oxygen Induction Type: IV induction

## 2018-04-10 NOTE — Progress Notes (Signed)
CT surgery  Intractable nausea-w/ poor oral  intake Will need to stop rifampin, cont IV Ancef. Nutrition consult placed Will need SNF when wounds can be safely managed out of hospital If overall condition continues to decline would benefit from [place] Prescott Gum MD

## 2018-04-10 NOTE — Progress Notes (Signed)
[facility] PROGRESS NOTE  Victor [name] KDX:[id number] DOB: 05-Jan-1936 DOA: 04/02/2018  PCP: Cyndi Bender, PA-C  Brief History/Interval Summary: 82 year old Caucasian male with a past medical history of COPD on home oxygen, history of coronary artery disease status post CABG, chronic systolic CHF with a EF of 30% with ICD, hypertension, stroke presented with cellulitis over the sternal area.  He was seen by cardiothoracic surgery.  He also developed acute renal failure.  He was also seen by nephrology.  Reason for Visit: Acute on chronic kidney disease stage III  Consultants: Nephrology.  Cardiothoracic surgery.  Infectious disease.  Procedures:  Incision, drainage, excisional debridement of lower sternal and upper abdominal wounds from old external AICD system placed at outside hospital 25 years ago.  Wound VAC.  Antibiotics: Patient was on vancomycin which has been discontinued.  He is currently on cefazolin and rifampin.   Subjective/Interval History: Patient seen after he returned from the OR.  Still somewhat somnolent but easily arousable.  Denies any pain currently.  Calling the nursing staff he has been nauseated.  He has had very poor appetite.  ROS: Unable to do currently  Objective:  Vital Signs  Vitals:   04/10/18 0939 04/10/18 0945 04/10/18 1001 04/10/18 1100  BP: (!) 132/59 (!) 154/62 (!) 168/67 (!) 146/54  Pulse: (!) 53 (!) 59 60 (!) 59  Resp: 15 15 16 15   Temp: (!) 97.4 F (36.3 C)  (!) 97.5 F (36.4 C) (!) 97.4 F (36.3 C)  TempSrc:   Axillary Axillary  SpO2: 100% 100% 100% 100%  Weight:      Height:        Intake/Output Summary (Last 24 hours) at 04/10/2018 1359 Last data filed at 04/10/2018 9767 Gross per 24 hour  Intake 790 ml  Output 736 ml  Net 54 ml   Filed Weights   04/07/18 0529 04/08/18 0250 04/10/18 0332  Weight: 64.8 kg (142 lb 14.4 oz) 64.6 kg (142 lb 6.7 oz) 69.2 kg (152 lb 8 oz)    General appearance: cooperative,  appears stated age and no distress Head: Normocephalic, without obvious abnormality, atraumatic Resp: Normal effort.  Clear to auscultation bilaterally.  No wheezing rales or rhonchi. Cardio: regular rate and rhythm, S1, S2 normal, no murmur, click, rub or gallop GI: Dressings noted on the abdomen.  Nontender.  Bowel sounds present. Extremities: extremities normal, atraumatic, no cyanosis or edema Neurologic: Somewhat lethargic but no obvious focal deficits.  Lab Results:  Data Reviewed: I have personally reviewed following labs and imaging studies  CBC: Recent Labs  Lab 04/04/18 0223 04/04/18 0847 04/06/18 0331 04/07/18 0257 04/10/18 0235  WBC 9.9 11.2* 9.4 10.6* 10.7*  HGB 8.4* 9.4* 8.3* 9.1* 9.3*  HCT 27.4* 30.1* 26.4* 29.5* 31.6*  MCV 81.1 80.7 80.5 81.0 84.9  PLT 220 224 237 270 341    Basic Metabolic Panel: Recent Labs  Lab 04/06/18 0331 04/07/18 0257 04/08/18 0314 04/09/18 0313 04/10/18 0235  NA 135 139 138 141 140  K 4.2 3.9 3.6 3.8 4.0  CL 90* 95* 97* 99* 101  CO2 32 31 28 32 28  GLUCOSE 167* 93 161* 91 162*  BUN 45* 43* 40* 31* 21*  CREATININE 4.46* 4.38* 3.82* 2.89* 1.92*  CALCIUM 8.3* 8.3* 8.1* 8.5* 8.4*  PHOS  --   --  3.9 3.5 3.2    GFR: Estimated Creatinine Clearance: 28.7 mL/min (A) (by C-G formula based on SCr of 1.92 mg/dL (H)).  Liver Function Tests: Recent Labs  Lab 04/04/18 0847 04/06/18 0331 04/07/18 0257 04/08/18 0314 04/09/18 0313 04/10/18 0235  AST 35 21 20  --   --   --   ALT 21 8* <5*  --   --   --   ALKPHOS 121 114 141*  --   --   --   BILITOT 0.8 1.0 1.0  --   --   --   PROT 7.1 6.5 6.7  --   --   --   ALBUMIN 2.9* 2.6* 2.7* 2.5* 2.5* 2.5*     Coagulation Profile: Recent Labs  Lab 04/04/18 0223  INR 2.18    CBG: Recent Labs  Lab 04/09/18 1630 04/09/18 2116 04/10/18 0603 04/10/18 0918 04/10/18 1127  GLUCAP 146* 98 159* 139* 118*     Recent Results (from the past 240 hour(s))  Culture, blood (Routine X  2) w Reflex to ID Panel     Status: None   Collection Time: 04/02/18  7:19 PM  Result Value Ref Range Status   Specimen Description BLOOD RIGHT ANTECUBITAL  Final   Special Requests   Final    BOTTLES DRAWN AEROBIC AND ANAEROBIC Blood Culture adequate volume   Culture   Final    NO GROWTH 5 DAYS Performed at [facility], [place] [address] [place], [place] [address]    Report Status 04/07/2018 FINAL  Final  Culture, blood (Routine X 2) w Reflex to ID Panel     Status: None   Collection Time: 04/02/18 10:30 PM  Result Value Ref Range Status   Specimen Description BLOOD LEFT ANTECUBITAL  Final   Special Requests   Final    BOTTLES DRAWN AEROBIC AND ANAEROBIC Blood Culture results may not be optimal due to an inadequate volume of blood received in culture bottles   Culture   Final    NO GROWTH 5 DAYS Performed at [facility], [place] [address], [place], [place] [address]    Report Status 04/08/2018 FINAL  Final  MRSA PCR Screening     Status: None   Collection Time: 04/03/18 12:42 AM  Result Value Ref Range Status   MRSA by PCR NEGATIVE NEGATIVE Final    Comment:        The GeneXpert MRSA Assay (FDA approved for NASAL specimens only), is one component of a comprehensive MRSA colonization surveillance program. It is not intended to diagnose MRSA infection nor to guide or monitor treatment for MRSA infections. Performed at [facility], [place] [address], [place], [place] [address]   Aerobic/Anaerobic Culture (surgical/deep wound)     Status: None   Collection Time: 04/05/18  8:41 AM  Result Value Ref Range Status   Specimen Description WOUND  Final   Special Requests STERNAL WOUND  Final   Gram Stain   Final    MODERATE WBC PRESENT, PREDOMINANTLY PMN RARE GRAM POSITIVE COCCI IN CLUSTERS    Culture   Final    MODERATE STAPHYLOCOCCUS AUREUS NO ANAEROBES ISOLATED Performed at [facility], [place] [address] [place], [place] [address]    Report Status  04/10/2018 FINAL  Final   Organism ID, Bacteria STAPHYLOCOCCUS AUREUS  Final      Susceptibility   Staphylococcus aureus - MIC*    CIPROFLOXACIN <=0.5 SENSITIVE Sensitive     ERYTHROMYCIN <=0.25 SENSITIVE Sensitive     GENTAMICIN <=0.5 SENSITIVE Sensitive     OXACILLIN <=0.25 SENSITIVE Sensitive     TETRACYCLINE <=1 SENSITIVE Sensitive  VANCOMYCIN 1 SENSITIVE Sensitive     TRIMETH/SULFA <=10 SENSITIVE Sensitive     CLINDAMYCIN <=0.25 SENSITIVE Sensitive     RIFAMPIN <=0.5 SENSITIVE Sensitive     Inducible Clindamycin NEGATIVE Sensitive     * MODERATE STAPHYLOCOCCUS AUREUS      Radiology Studies: No results found.   Medications:  Scheduled: . amiodarone  200 mg Oral Daily  . bisacodyl  10 mg Oral Daily  . bisoprolol  2.5 mg Oral Daily  . calcium-vitamin D  1 tablet Oral QHS  . darbepoetin (ARANESP) injection - NON-DIALYSIS  100 mcg Subcutaneous Q Fri-1800  . docusate sodium  100 mg Oral QHS  . DULoxetine  30 mg Oral Daily  . feeding supplement (ENSURE ENLIVE)  237 mL Oral TID BM  . ferrous sulfate  325 mg Oral BID WC  . insulin aspart  0-9 Units Subcutaneous TID WC  . insulin detemir  7 Units Subcutaneous BH-q7a  . mirtazapine  7.5 mg Oral QHS  . multivitamin with minerals  1 tablet Oral Daily  . pantoprazole  40 mg Oral Daily  . polyethylene glycol  17 g Oral TID  . pravastatin  40 mg Oral q1800  . senna-docusate  1 tablet Oral BID  . vitamin B-12  100 mcg Oral Daily   Continuous: .  ceFAZolin (ANCEF) IV     OVF:IEPPIRJJOACZY, albuterol, guaiFENesin-dextromethorphan, hydrALAZINE, methocarbamol, nitroGLYCERIN, ondansetron (ZOFRAN) IV, oxyCODONE-acetaminophen, promethazine, sodium phosphate  Assessment/Plan:   Sternal and abdominal wall abscess/infected left upper quadrant rectus sheath hematoma at the site of prior AICD Patient seen by cardiothoracic surgery. He was taken to the OR and underwent incision and drainage. Wound VAC in place. Cultures positive for  staph aureus. Patient currentlyon cefazolin and rifampin. Infectious disease following. Patient also being followed by cardiothoracic surgery.  Patient could not tolerate bedside dressing changes.  So he was taken to the OR today.  Wound VAC has been removed.  Due to persistent nausea cardiothoracic surgery has discontinued rifampin.  Acute on chronic kidney disease stage III Worsening renal function thought to be due to contrast versus hemodynamic instability. Nephrology was consulted. He was hydrated.??Urine output has improved in the last 2 days. ?Creatinine is improving as well. ?Baseline creatinine is around 1.2. ?Continue to monitor for now.  Foley catheter was discontinued.  Acute on chronic systolic and diastolic CHF EF is noted to be 30 to 35% based on echocardiogram from February. Currently stable. Not on ACE inhibitor or ARB due to acute kidney injury. He is noted to be on bisoprolol.  Diabetes mellitus type 2 CBGs reasonably well controlled. He is noted to be on sliding scale coverage and Levemir. HbA1c was 8.6 in February.  History of coronary artery disease Stable. Continue beta-blocker statin.  History of stroke Stable.  History of essential hypertension Blood pressure is reasonably well controlled. ?Continue to monitor closely.  History of COPD on home oxygen Not an active issue currently. Continue to monitor closely.  History of permanent atrial fibrillation Patient is on Eliquis at home which was held due to his renal failure. Rate is reasonably well controlled. Continue amiodarone and bisoprolol. Chads 2 vascular score is 8.  If he is placed back on rifampin the only anticoagulation choice would be either to do Lovenox or warfarin with strict monitoring.  Will need to wait for surgery to clear him to be placed back on anticoagulation.  Normocytic anemia Hemoglobin is stable. Likely anemia of chronic disease. No overt blood loss is present.  Severe protein calorie  malnutrition Encourage oral intake. Continues to have poor appetite.  Goals of care Patient with multiple medical issues.  Patient has been very slow to improve.  He has not been participating with physical therapy.  He is slowly declining.  At this time it is reasonable to involve palliative medicine to establish goals of care.  Discussed with patient's grandson who is his power of attorney and he agrees.  Palliative medicine consulted.     DVT Prophylaxis: SCDs ?? Code Status: Full code Family Communication: Discussed with patient's grandson Disposition Plan:?Continue management as outlined above. ?PT evaluation. ?Will likely need some form of rehab.    LOS: 8 days   [facility] Pager [phone] 04/10/2018, 1:59 PM  If 7PM-7AM, please contact night-coverage at [email or web address], password [facility]

## 2018-04-10 NOTE — Care Management Note (Addendum)
Case Management Note  Patient Details  Name: Victor [name] MRN: [id number] Date of Birth: [birth year]  Subjective/Objective:    Pt admitted with sternal area wound - pt is s/p CABG                 Action/Plan:  PTA independent from home.  Pt from home alone - states neighbors are support system.  Pt has walker, cane and scooter in the home.  Pt has home oxygen 3 liter continious supplied via [facility].  Plan is for pt to have I&D 5/17.  Pt may discharge with a wound vac - CM will continue to follow for discharge needs   Expected Discharge Date:                  Expected Discharge Plan:  [place]  In-House Referral:     Discharge planning Services  CM Consult  Post Acute Care Choice:    Choice offered to:     DME Arranged:    DME Agency:     HH Arranged:    [place] Agency:     Status of Service:     If discussed at [employer], dates discussed:    Additional Comments: 04/10/2018  CM requested LTACH referral review with physician advisor. At this time, both wound vacs have been removed - may go to OR to have it replaced.  Pt remains on IV antibiotics.  CSW following for probable discharge to SNF - per cardiothoracic surgery  04/06/18 Cardiothoracic PA informed CM that the plan will be for pt to return to OR for an additional debridement.  Per attending - service will revisit need for wound vac post upcoming I&D - CM requested that CM consult be written for DME when deemed appropriate.  Pt requested CM to return at another time to discuss [facility] agency  04/05/18 Pt is now s/p I&D - 2 wound vacs applied.  Pt requiring IV antibiotic - may need long term - [facility] following.  Determination has not been made regarding wound vac being needed at discharge (CM requested consult if wound vac will be necessary at discharge).  CM offered choice for [facility] including HHRN - pt will discuss with son and inform CM of choice of agency.    Maryclare Labrador, RN 04/10/2018, 3:20 PM

## 2018-04-10 NOTE — Progress Notes (Signed)
Pt was back from PACU for changing wound VAC dressing to wet to dry dressing. Dr. Prescott Gum changed dressing at OR and change only dry gauze if dressing is saturated by drainage. Pt's sedated by medication and shake him to wake up. Denied pain at this time. He wanted to leave him alone. Mid sternum & abd wound dressing is clean. Monitor pt's condition. HS [employer]

## 2018-04-10 NOTE — Anesthesia Postprocedure Evaluation (Signed)
Anesthesia Post Note  Patient: Victor [name]  Procedure(s) Performed: WOUND VAC CHANGE (N/A Chest)     Patient location during evaluation: PACU Anesthesia Type: MAC Level of consciousness: awake and alert Pain management: pain level controlled Vital Signs Assessment: post-procedure vital signs reviewed and stable Respiratory status: spontaneous breathing, nonlabored ventilation, respiratory function stable and patient connected to nasal cannula oxygen Cardiovascular status: stable and blood pressure returned to baseline Postop Assessment: no apparent nausea or vomiting Anesthetic complications: no    Last Vitals:  Vitals:   04/10/18 0930 04/10/18 0939  BP: (!) 151/58 (!) 132/59  Pulse: (!) 56 (!) 53  Resp: 15 15  Temp:  (!) 36.3 C  SpO2: 100% 100%    Last Pain:  Vitals:   04/10/18 0930  TempSrc:   PainSc: Asleep                 Capone Schwinn,W. EDMOND

## 2018-04-10 NOTE — Progress Notes (Signed)
Pharmacy Antibiotic Note  Victor [name] is a 82 y.o. male admitted on 04/02/2018 with wound infection. Pharmacy has been consulted for cefazolin dosing.  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 However, on final post-op visit, noticed worsening upper abdominal wound with mucoid drainage. Per ID, will need 6wks of IV cefazolin + PO rifampin.  Today, patient is afebrile and WBC is stable. Renal function has been improving with Scr 2.41>>1.9 (BL 1.1). CrCl 28.7. Rifampin currently discontinued per MD due to N/V.  Plan: 1) Increase cefazolin 2 g IV q12h 2) Monitor clinical progress, c/s, renal function  Height: 5\' 7"  (170.2 cm) Weight: 152 lb 8 oz (69.2 kg) IBW/kg (Calculated) : 66.1  Temp (24hrs), Avg:97.6 F (36.4 C), Min:97.4 F (36.3 C), Max:97.9 F (36.6 C)  Recent Labs  Lab 04/04/18 0223 04/04/18 0847  04/06/18 0331 04/07/18 0257 04/08/18 0314 04/09/18 0313 04/10/18 0235  WBC 9.9 11.2*  --  9.4 10.6*  --   --  10.7*  CREATININE 2.41* 2.37*   < > 4.46* 4.38* 3.82* 2.89* 1.92*   < > = values in this interval not displayed.    Estimated Creatinine Clearance: 28.7 mL/min (A) (by C-G formula based on SCr of 1.92 mg/dL (H)).    Allergies  Allergen Reactions  . Coreg [Carvedilol] Other (See Comments)    Shortness of breath ,fatigue ,dizzyness   . Lovastatin Other (See Comments)    CK elevation, Myalgias  . Ativan [Lorazepam] Other (See Comments)    Pt. had side effects    Antimicrobials this admission: 5/15 Cefazolin >>   Dose adjustments this admission: N/A  Microbiology results: 5/13 BCx: neg 5/14 MRSA PCR: neg 5/16 Sternal wound: staph aureus (pan-S)   Thank you for allowing pharmacy to be a part of this patient's care.  Almira Bar, PharmD Student 04/10/2018 1:56 PM

## 2018-04-10 NOTE — Brief Op Note (Signed)
04/10/2018  9:07 AM  PATIENT:  Victor [name]  82 y.o. male  PRE-OPERATIVE DIAGNOSIS:  CHEST, Abdominal INFECTION  POST-OPERATIVE DIAGNOSIS:  CHEST, Abdominal INFECTION  PROCEDURE:  Procedure(s): WOUND VAC CHANGE (N/A)  Pulse lavage irrigation of wound Application of A-cell powder and sheet  SURGEON:  Surgeon(s) and Role:    Ivin Poot, MD - Primary  PHYSICIAN ASSISTANT:   ASSISTANTS: none   ANESTHESIA:   MAC  EBL:  10 mL   BLOOD ADMINISTERED:none  DRAINS: none   LOCAL MEDICATIONS USED:  LIDOCAINE  and Amount: 8 ml  SPECIMEN:  No Specimen  DISPOSITION OF SPECIMEN:  N/A  COUNTS:  YES  TOURNIQUET:  * No tourniquets in log *  DICTATION: .Dragon Dictation  PLAN OF CARE: return to 2 C  PATIENT DISPOSITION:  PACU - hemodynamically stable.   Delay start of Pharmacological VTE agent (>24hrs) due to surgical blood loss or risk of bleeding: yes

## 2018-04-11 ENCOUNTER — Inpatient Hospital Stay: Payer: Self-pay

## 2018-04-11 ENCOUNTER — Encounter (HOSPITAL_COMMUNITY): Payer: Self-pay | Admitting: Cardiothoracic Surgery

## 2018-04-11 DIAGNOSIS — A4901 Methicillin susceptible Staphylococcus aureus infection, unspecified site: Secondary | ICD-10-CM

## 2018-04-11 DIAGNOSIS — R5381 Other malaise: Secondary | ICD-10-CM

## 2018-04-11 DIAGNOSIS — Z515 Encounter for palliative care: Secondary | ICD-10-CM

## 2018-04-11 DIAGNOSIS — Z7189 Other specified counseling: Secondary | ICD-10-CM

## 2018-04-11 LAB — RENAL FUNCTION PANEL
Albumin: 2.5 g/dL — ABNORMAL LOW (ref 3.5–5.0)
Anion gap: 9 (ref 5–15)
BUN: 17 mg/dL (ref 6–20)
CHLORIDE: 100 mmol/L — AB (ref 101–111)
CO2: 32 mmol/L (ref 22–32)
CREATININE: 1.66 mg/dL — AB (ref 0.61–1.24)
Calcium: 8.5 mg/dL — ABNORMAL LOW (ref 8.9–10.3)
GFR calc Af Amer: 43 mL/min — ABNORMAL LOW (ref >60)
GFR calc non Af Amer: 37 mL/min — ABNORMAL LOW (ref >60)
GLUCOSE: 224 mg/dL — AB (ref 65–99)
POTASSIUM: 3.9 mmol/L (ref 3.5–5.1)
Phosphorus: 3.3 mg/dL (ref 2.5–4.6)
Sodium: 141 mmol/L (ref 135–145)

## 2018-04-11 LAB — URINALYSIS, ROUTINE W REFLEX MICROSCOPIC
Bilirubin Urine: NEGATIVE
Glucose, UA: >=500 mg/dL — AB
Hgb urine dipstick: NEGATIVE
Ketones, ur: NEGATIVE mg/dL
Leukocytes, UA: NEGATIVE
Nitrite: NEGATIVE
Protein, ur: 30 mg/dL — AB
Specific Gravity, Urine: 1.017 (ref 1.005–1.030)
pH: 5 (ref 5.0–8.0)

## 2018-04-11 LAB — GLUCOSE, CAPILLARY
GLUCOSE-CAPILLARY: 173 mg/dL — AB (ref 65–99)
GLUCOSE-CAPILLARY: 130 mg/dL — AB (ref 65–99)
Glucose-Capillary: 156 mg/dL — ABNORMAL HIGH (ref 65–99)
Glucose-Capillary: 231 mg/dL — ABNORMAL HIGH (ref 65–99)

## 2018-04-11 MED ORDER — URELLE 81 MG PO TABS
1.0000 | ORAL_TABLET | Freq: Three times a day (TID) | ORAL | Status: DC | PRN
  Administered 2018-04-11: 81 mg via ORAL
  Filled 2018-04-11: qty 1

## 2018-04-11 MED ORDER — SENNOSIDES-DOCUSATE SODIUM 8.6-50 MG PO TABS
2.0000 | ORAL_TABLET | Freq: Two times a day (BID) | ORAL | Status: DC
Start: 2018-04-11 — End: 2018-04-13
  Administered 2018-04-11 – 2018-04-13 (×3): 2 via ORAL
  Filled 2018-04-11 (×4): qty 2

## 2018-04-11 MED ORDER — FLEET ENEMA 7-19 GM/118ML RE ENEM
1.0000 | ENEMA | Freq: Once | RECTAL | Status: DC
  Filled 2018-04-11 (×2): qty 1

## 2018-04-11 NOTE — Progress Notes (Signed)
[facility]                                                                              Patient Demographics  Victor [name], is a 82 y.o. male, DOB - [birth year], ZSW:[id number]  Admit date - 04/02/2018   Admitting Physician Ivor Costa, MD  Outpatient Primary MD for the patient is Cyndi Bender, PA-C  Outpatient specialists:   LOS - 9  days   Medical records reviewed and are as summarized below:    No chief complaint on file.      Brief summary   82 year old Caucasian male with a past medical history of COPD on home oxygen, history of coronary artery disease status post CABG, chronic systolic CHF with a EF of 30% with ICD, hypertension, stroke presented with cellulitis over the sternal area. He was seen by cardiothoracic surgery. He also developed acute renal failure. He was also seen by nephrology.  Assessment & Plan    Principal Problem:   MSSA  infection, sternal and abdominal wall abscess/infected left upper quadrant rectus sheath hematoma at the site of primary ICD -Status post IND, lavage therapy, vacuum-assisted closure device change of lower sternal and upper abdominal wounds on 5/21 -CTVS following, continue wet-to-dry dressing changes -Will benefit from [facility] or skilled nursing facility -Infectious disease following, recommended continue cefazolin for 6-week, PICC line.  Rifampin was discontinued by CTVS due to intractable nausea.  Active Problems: Acute on chronic kidney disease stage III -Creatinine worsened likely due to contrast nephropathy versus hemodynamic instability. -Nephrology was consulted, hydrated, creatinine now improving baseline creatinine 1.2 - catheter was discontinued, creatinine improved to 1.6 today, peaked at 4.46     Type II diabetes mellitus with renal manifestations (HCC) -CBGs reasonably well controlled, continue sliding scale insulin, Levemir -HbA1c 8.6  Acute on Chronic systolic and diastolic CHF (congestive heart  failure) (HCC) -EF 30 to 35%, per 2D echo 2/19 - Continue beta-blocker, not on ACE inhibitor or ARB due to AKI  COPD on home O2, chronic respiratory failure -Currently stable, no wheezing  Permanent atrial fibrillation -Rate controlled, continue amiodarone, bisoprolol  - CHADS VASC 2, was on Eliquis, held due to renal failure - Creatinine now improving, will await for surgery to clear for anticoagulation    Protein-calorie malnutrition, severe Nutrition following  Code Status: DNR DVT Prophylaxis:  SCD's Family Communication: Discussed in detail with the patient, all imaging results, lab results explained to the patient    Disposition Plan: Discussed with case management for LTAC  Time Spent in minutes   35 minutes  Procedures:  Incision, drainage, excisional debridement of lower sternal and upper abdominal wounds from old external AICD system placed at outside hospital 25 years ago.   Consultants:   Nephrology Infectious disease CTVS  Antimicrobials:      Medications  Scheduled Meds: . amiodarone  200 mg Oral Daily  . bisacodyl  10 mg Oral Daily  . bisoprolol  2.5 mg Oral Daily  . calcium-vitamin D  1 tablet Oral QHS  . darbepoetin (ARANESP) injection - NON-DIALYSIS  100 mcg Subcutaneous Q Fri-1800  . docusate sodium  100 mg Oral QHS  .  DULoxetine  30 mg Oral Daily  . feeding supplement (ENSURE ENLIVE)  237 mL Oral TID BM  . ferrous sulfate  325 mg Oral BID WC  . insulin aspart  0-9 Units Subcutaneous TID WC  . insulin detemir  7 Units Subcutaneous BH-q7a  . mirtazapine  7.5 mg Oral QHS  . multivitamin with minerals  1 tablet Oral Daily  . pantoprazole  40 mg Oral Daily  . polyethylene glycol  17 g Oral TID  . pravastatin  40 mg Oral q1800  . senna-docusate  1 tablet Oral BID  . sodium phosphate  1 enema Rectal Once  . vitamin B-12  100 mcg Oral Daily   Continuous Infusions: .  ceFAZolin (ANCEF) IV Stopped (04/11/18 0550)   PRN Meds:.acetaminophen,  albuterol, guaiFENesin-dextromethorphan, hydrALAZINE, methocarbamol, nitroGLYCERIN, ondansetron (ZOFRAN) IV, oxyCODONE-acetaminophen, promethazine, sodium phosphate, URELLE   Antibiotics   Anti-infectives (From admission, onward)   Start     Dose/Rate Route Frequency Ordered Stop   04/10/18 1800  ceFAZolin (ANCEF) IVPB 2g/100 mL premix     2 g 200 mL/hr over 30 Minutes Intravenous Every 12 hours 04/10/18 1354     04/10/18 0815  vancomycin (VANCOCIN) 1,000 mg in sodium chloride 0.9 % 1,000 mL irrigation      Irrigation To Surgery 04/10/18 0811 04/10/18 0850   04/06/18 1700  rifampin (RIFADIN) capsule 300 mg  Status:  Discontinued     300 mg Oral 2 times daily with meals 04/06/18 1018 04/10/18 1037   04/05/18 1800  ceFAZolin (ANCEF) IVPB 1 g/50 mL premix  Status:  Discontinued     1 g 100 mL/hr over 30 Minutes Intravenous Every 12 hours 04/04/18 2056 04/10/18 1354   04/05/18 0800  ceFAZolin (ANCEF) IVPB 2g/100 mL premix     2 g 200 mL/hr over 30 Minutes Intravenous To ShortStay Procedural 04/04/18 0809 04/05/18 0740   04/05/18 0700  vancomycin (VANCOCIN) 1,000 mg in sodium chloride 0.9 % 1,000 mL irrigation      Irrigation Once PRN 04/04/18 1429 04/05/18 0834   04/04/18 0900  ceFAZolin (ANCEF) IVPB 1 g/50 mL premix  Status:  Discontinued     1 g 100 mL/hr over 30 Minutes Intravenous Every 12 hours 04/04/18 0754 04/04/18 2056   04/03/18 2200  rifampin (RIFADIN) capsule 300 mg  Status:  Discontinued     300 mg Oral Every 12 hours 04/03/18 1829 04/06/18 1018   04/03/18 1900  vancomycin (VANCOCIN) IVPB 1000 mg/200 mL premix  Status:  Discontinued     1,000 mg 200 mL/hr over 60 Minutes Intravenous Every 24 hours 04/03/18 1829 04/04/18 0505   04/02/18 2200  ceFAZolin (ANCEF) IVPB 1 g/50 mL premix     1 g 100 mL/hr over 30 Minutes Intravenous  Once 04/02/18 2148 04/02/18 2348   04/02/18 2200  vancomycin (VANCOCIN) IVPB 1000 mg/200 mL premix  Status:  Discontinued     1,000 mg 200 mL/hr over  60 Minutes Intravenous  Once 04/02/18 2148 04/02/18 2154   04/02/18 2200  vancomycin (VANCOCIN) 1,250 mg in sodium chloride 0.9 % 250 mL IVPB  Status:  Discontinued     1,250 mg 166.7 mL/hr over 90 Minutes Intravenous  Once 04/02/18 2154 04/02/18 2155   04/02/18 2200  vancomycin (VANCOCIN) 1,250 mg in sodium chloride 0.9 % 250 mL IVPB  Status:  Discontinued     1,250 mg 166.7 mL/hr over 90 Minutes Intravenous Every 24 hours 04/02/18 2155 04/03/18 1030        Subjective:  Victor [name] was seen and examined today.  Likely sleepy, denies any specific complaints.  Difficult to obtain review of system. No acute events overnight.    Objective:   Vitals:   04/10/18 1950 04/11/18 0001 04/11/18 0337 04/11/18 0841  BP: (!) 146/59 (!) 147/49 (!) 81/50 (!) 146/52  Pulse: 65 66 69   Resp: 19 (!) 23 20   Temp: 97.9 F (36.6 C) 97.9 F (36.6 C) 97.8 F (36.6 C) 97.7 F (36.5 C)  TempSrc: Oral Oral Oral Oral  SpO2: 97% 99% 100%   Weight:   68 kg (149 lb 14.6 oz)   Height:        Intake/Output Summary (Last 24 hours) at 04/11/2018 1059 Last data filed at 04/11/2018 1751 Gross per 24 hour  Intake 200 ml  Output 1075 ml  Net -875 ml     Wt Readings from Last 3 Encounters:  04/11/18 68 kg (149 lb 14.6 oz)  03/28/18 63.5 kg (140 lb)  02/14/18 68 kg (150 lb)     Exam  General: Somnolent but arousable NAD  Eyes:   HEENT:  Atraumatic, normocephalic  Cardiovascular: S1 S2 auscultated, Regular rate and rhythm.  Respiratory: Clear to auscultation bilaterally, no wheezing, rales or rhonchi  Gastrointestinal: Soft, NT, ND  + bowel sounds, dressing noted on the abdomen  Ext: no pedal edema bilaterally  Neuro: no new deficits  Musculoskeletal: No digital cyanosis, clubbing  Skin: Sternal dressing, abdominal dressing  Psych: somnolent   Data Reviewed:  I have personally reviewed following labs and imaging studies  Micro Results Recent Results (from the past 240 hour(s))    Culture, blood (Routine X 2) w Reflex to ID Panel     Status: None   Collection Time: 04/02/18  7:19 PM  Result Value Ref Range Status   Specimen Description BLOOD RIGHT ANTECUBITAL  Final   Special Requests   Final    BOTTLES DRAWN AEROBIC AND ANAEROBIC Blood Culture adequate volume   Culture   Final    NO GROWTH 5 DAYS Performed at [facility], [address] [place], [place] [address]    Report Status 04/07/2018 FINAL  Final  Culture, blood (Routine X 2) w Reflex to ID Panel     Status: None   Collection Time: 04/02/18 10:30 PM  Result Value Ref Range Status   Specimen Description BLOOD LEFT ANTECUBITAL  Final   Special Requests   Final    BOTTLES DRAWN AEROBIC AND ANAEROBIC Blood Culture results may not be optimal due to an inadequate volume of blood received in culture bottles   Culture   Final    NO GROWTH 5 DAYS Performed at [facility], [place] [address], [place], [place] [address]    Report Status 04/08/2018 FINAL  Final  MRSA PCR Screening     Status: None   Collection Time: 04/03/18 12:42 AM  Result Value Ref Range Status   MRSA by PCR NEGATIVE NEGATIVE Final    Comment:        The GeneXpert MRSA Assay (FDA approved for NASAL specimens only), is one component of a comprehensive MRSA colonization surveillance program. It is not intended to diagnose MRSA infection nor to guide or monitor treatment for MRSA infections. Performed at [facility], [place] [address] [place], [place] [address]   Aerobic/Anaerobic Culture (surgical/deep wound)     Status: None   Collection Time: 04/05/18  8:41 AM  Result Value Ref Range Status  Specimen Description WOUND  Final   Special Requests STERNAL WOUND  Final   Gram Stain   Final    MODERATE WBC PRESENT, PREDOMINANTLY PMN RARE GRAM POSITIVE COCCI IN CLUSTERS    Culture   Final    MODERATE STAPHYLOCOCCUS AUREUS NO ANAEROBES ISOLATED Performed at [facility], [address], [place], [place]  [address]    Report Status 04/10/2018 FINAL  Final   Organism ID, Bacteria STAPHYLOCOCCUS AUREUS  Final      Susceptibility   Staphylococcus aureus - MIC*    CIPROFLOXACIN <=0.5 SENSITIVE Sensitive     ERYTHROMYCIN <=0.25 SENSITIVE Sensitive     GENTAMICIN <=0.5 SENSITIVE Sensitive     OXACILLIN <=0.25 SENSITIVE Sensitive     TETRACYCLINE <=1 SENSITIVE Sensitive     VANCOMYCIN 1 SENSITIVE Sensitive     TRIMETH/SULFA <=10 SENSITIVE Sensitive     CLINDAMYCIN <=0.25 SENSITIVE Sensitive     RIFAMPIN <=0.5 SENSITIVE Sensitive     Inducible Clindamycin NEGATIVE Sensitive     * MODERATE STAPHYLOCOCCUS AUREUS    Radiology Reports Dg Chest 2 View  Result Date: 04/04/2018 CLINICAL DATA:  Pre op chest exam. Pt states he has an infection at the site of his previous pacemaker. He has a new pacemaker at his left shoulder. EXAM: CHEST - 2 VIEW COMPARISON:  Chest x-ray dated 12/28/2017. FINDINGS: Stable cardiomegaly. No confluent opacity to suggest a developing pneumonia. No pleural effusion or pneumothorax seen. No acute or suspicious osseous finding. LEFT chest wall pacemaker/ICD apparatus appears stable in position with intact lead. IMPRESSION: No active cardiopulmonary disease. No evidence of pneumonia or pulmonary edema. Stable cardiomegaly. Electronically Signed   By: Franki Cabot M.D.   On: 04/04/2018 22:36   Ct Head Wo Contrast  Result Date: 04/03/2018 CLINICAL DATA:  82 year old male fell 1 week ago hurting left shoulder. Bilateral neck pain. Denies injury to head or loss of consciousness. Initial encounter. EXAM: CT HEAD WITHOUT CONTRAST CT CERVICAL SPINE WITHOUT CONTRAST TECHNIQUE: Multidetector CT imaging of the head and cervical spine was performed following the standard protocol without intravenous contrast. Multiplanar CT image reconstructions of the cervical spine were also generated. COMPARISON:  07/16/2017 head CT.  10/23/2015 cervical spine CT. FINDINGS: CT HEAD FINDINGS Brain: No  intracranial hemorrhage or CT evidence of large acute infarct. Chronic microvascular changes. Mild global atrophy. Left parietal 5 mm dural-based calcification may represent small meningioma without associated vasogenic edema and unchanged. Vascular: Vascular calcifications Skull: No skull fracture Sinuses/Orbits: No acute orbital abnormality. Visualized paranasal sinuses are clear. Other: Mastoid air cells and middle ear cavities are clear. CT CERVICAL SPINE FINDINGS Alignment: Alignment similar to prior cervical spine CT. Minimal anterior slip C3 and C4. Skull base and vertebrae: No cervical spine fracture. Soft tissues and spinal canal: No abnormal prevertebral soft tissue swelling. Disc levels: Transverse ligament hypertrophy. Multilevel cervical spondylotic changes C3-4 through C6-7 have progressed slightly since prior exam. Upper chest: Bilateral pleural effusions layering posteriorly. Other: Prominent caries. Pacemaker in place. Prominent carotid bifurcation calcifications. IMPRESSION: CT HEAD: No skull fracture or intracranial hemorrhage. Chronic microvascular changes. Mild global atrophy. Left parietal 5 mm dural-based calcification may represent small meningioma without associated vasogenic edema and unchanged. CT CERVICAL SPINE: Alignment similar to prior cervical spine CT. Minimal anterior slip C3 and C4. No cervical spine fracture or abnormal prevertebral soft tissue swelling. Multilevel cervical spondylotic changes C3-4 through C6-7 have progressed slightly since prior exam. Bilateral pleural effusions layering posteriorly. Prominent carotid bifurcation calcifications. Electronically Signed  By: Genia Del M.D.   On: 04/03/2018 10:12   Ct Chest W Contrast  Result Date: 04/02/2018 CLINICAL DATA:  Recent surgery to remove infected wires from previous AICD. Also history of anal fistula repair. EXAM: CT CHEST, ABDOMEN, AND PELVIS WITH CONTRAST TECHNIQUE: Multidetector CT imaging of the chest,  abdomen and pelvis was performed following the standard protocol during bolus administration of intravenous contrast. CONTRAST:  171mL OMNIPAQUE IOHEXOL 300 MG/ML  SOLN COMPARISON:  CT abdomen 12/26/2017.  CT chest 12/02/2017. FINDINGS: CT CHEST FINDINGS Cardiovascular: Aortic atherosclerosis. Previous median sternotomy and CABG. Coronary artery calcification. Cardiomegaly. Pacemaker/AICD in place. Pericardial device on the left. Pericardial device anteriorly. Mediastinum/Nodes: Slight enlargement of AP window nodes compared to the previous exam, presumably reactive. Lungs/Pleura: Small effusions layering dependently. Dependent pulmonary atelectasis and or pneumonia, most pronounced in the left lower lobe. Non dependent lungs are clear. Musculoskeletal: Small fluid density area anterior to the xiphoid process measures about 2.5 cm in diameter and could possibly represent a superficial abscess. CT ABDOMEN PELVIS FINDINGS Hepatobiliary: Liver parenchyma appears normal. Calcified gallstones as seen previously. Pancreas: Atrophic change.  No focal finding. Spleen: Normal Adrenals/Urinary Tract: Adrenal glands are normal. Kidneys are normal except for a few benign cysts. Bladder is normal. Stomach/Bowel: No acute bowel finding. Vascular/Lymphatic: Aortic atherosclerosis. Branch vessel atherosclerosis. No aneurysm. No retroperitoneal adenopathy. Reproductive: Normal Other: No free fluid or air. Musculoskeletal: Soft tissue swelling of the left anterior abdominal wall region including the rectus muscle and overlying sheath, surrounding leads, some of which have been removed since the study of February. The area of concern measures up to 2.8 cm in thickness and 8.4 cm right to left and extends over a length of about 8.3 cm. This may simply be postoperative sequela, but ongoing infection is not excluded. Ordinary degenerative changes affect the spine. IMPRESSION: Bilateral pleural effusions layering dependently with  dependent atelectasis and or pneumonia, left lower lobe predominant. Enlargement of AP window lymph nodes, presumably reactive. Fluid collection anterior to the xiphoid process measuring up to 2.5 cm in diameter. This could be infected material. Fluid at the left anterior abdominal wall including around the previous anterior pericardial/epicardial leads, some of which have been removed. This area measures approximately 2.8 x 8.4 x 8.3 cm. This could simply be postoperative swelling and edema, but the possibility of ongoing or recurrent infection is not excluded. The appearance is better than on the study of 12/26/2017. Electronically Signed   By: Nelson Chimes M.D.   On: 04/02/2018 22:03   Ct Cervical Spine Wo Contrast  Result Date: 04/03/2018 CLINICAL DATA:  82 year old male fell 1 week ago hurting left shoulder. Bilateral neck pain. Denies injury to head or loss of consciousness. Initial encounter. EXAM: CT HEAD WITHOUT CONTRAST CT CERVICAL SPINE WITHOUT CONTRAST TECHNIQUE: Multidetector CT imaging of the head and cervical spine was performed following the standard protocol without intravenous contrast. Multiplanar CT image reconstructions of the cervical spine were also generated. COMPARISON:  07/16/2017 head CT.  10/23/2015 cervical spine CT. FINDINGS: CT HEAD FINDINGS Brain: No intracranial hemorrhage or CT evidence of large acute infarct. Chronic microvascular changes. Mild global atrophy. Left parietal 5 mm dural-based calcification may represent small meningioma without associated vasogenic edema and unchanged. Vascular: Vascular calcifications Skull: No skull fracture Sinuses/Orbits: No acute orbital abnormality. Visualized paranasal sinuses are clear. Other: Mastoid air cells and middle ear cavities are clear. CT CERVICAL SPINE FINDINGS Alignment: Alignment similar to prior cervical spine CT. Minimal anterior slip C3 and C4. Skull  base and vertebrae: No cervical spine fracture. Soft tissues and spinal  canal: No abnormal prevertebral soft tissue swelling. Disc levels: Transverse ligament hypertrophy. Multilevel cervical spondylotic changes C3-4 through C6-7 have progressed slightly since prior exam. Upper chest: Bilateral pleural effusions layering posteriorly. Other: Prominent caries. Pacemaker in place. Prominent carotid bifurcation calcifications. IMPRESSION: CT HEAD: No skull fracture or intracranial hemorrhage. Chronic microvascular changes. Mild global atrophy. Left parietal 5 mm dural-based calcification may represent small meningioma without associated vasogenic edema and unchanged. CT CERVICAL SPINE: Alignment similar to prior cervical spine CT. Minimal anterior slip C3 and C4. No cervical spine fracture or abnormal prevertebral soft tissue swelling. Multilevel cervical spondylotic changes C3-4 through C6-7 have progressed slightly since prior exam. Bilateral pleural effusions layering posteriorly. Prominent carotid bifurcation calcifications. Electronically Signed   By: Genia Del M.D.   On: 04/03/2018 10:12   Ct Abdomen Pelvis W Contrast  Result Date: 04/02/2018 CLINICAL DATA:  Recent surgery to remove infected wires from previous AICD. Also history of anal fistula repair. EXAM: CT CHEST, ABDOMEN, AND PELVIS WITH CONTRAST TECHNIQUE: Multidetector CT imaging of the chest, abdomen and pelvis was performed following the standard protocol during bolus administration of intravenous contrast. CONTRAST:  155mL OMNIPAQUE IOHEXOL 300 MG/ML  SOLN COMPARISON:  CT abdomen 12/26/2017.  CT chest 12/02/2017. FINDINGS: CT CHEST FINDINGS Cardiovascular: Aortic atherosclerosis. Previous median sternotomy and CABG. Coronary artery calcification. Cardiomegaly. Pacemaker/AICD in place. Pericardial device on the left. Pericardial device anteriorly. Mediastinum/Nodes: Slight enlargement of AP window nodes compared to the previous exam, presumably reactive. Lungs/Pleura: Small effusions layering dependently. Dependent  pulmonary atelectasis and or pneumonia, most pronounced in the left lower lobe. Non dependent lungs are clear. Musculoskeletal: Small fluid density area anterior to the xiphoid process measures about 2.5 cm in diameter and could possibly represent a superficial abscess. CT ABDOMEN PELVIS FINDINGS Hepatobiliary: Liver parenchyma appears normal. Calcified gallstones as seen previously. Pancreas: Atrophic change.  No focal finding. Spleen: Normal Adrenals/Urinary Tract: Adrenal glands are normal. Kidneys are normal except for a few benign cysts. Bladder is normal. Stomach/Bowel: No acute bowel finding. Vascular/Lymphatic: Aortic atherosclerosis. Branch vessel atherosclerosis. No aneurysm. No retroperitoneal adenopathy. Reproductive: Normal Other: No free fluid or air. Musculoskeletal: Soft tissue swelling of the left anterior abdominal wall region including the rectus muscle and overlying sheath, surrounding leads, some of which have been removed since the study of February. The area of concern measures up to 2.8 cm in thickness and 8.4 cm right to left and extends over a length of about 8.3 cm. This may simply be postoperative sequela, but ongoing infection is not excluded. Ordinary degenerative changes affect the spine. IMPRESSION: Bilateral pleural effusions layering dependently with dependent atelectasis and or pneumonia, left lower lobe predominant. Enlargement of AP window lymph nodes, presumably reactive. Fluid collection anterior to the xiphoid process measuring up to 2.5 cm in diameter. This could be infected material. Fluid at the left anterior abdominal wall including around the previous anterior pericardial/epicardial leads, some of which have been removed. This area measures approximately 2.8 x 8.4 x 8.3 cm. This could simply be postoperative swelling and edema, but the possibility of ongoing or recurrent infection is not excluded. The appearance is better than on the study of 12/26/2017. Electronically  Signed   By: Nelson Chimes M.D.   On: 04/02/2018 22:03   Dg Chest Port 1 View  Result Date: 04/05/2018 CLINICAL DATA:  Status post sternal and abdominal wound debridement. Shortness of breath, cough EXAM: PORTABLE CHEST 1 VIEW COMPARISON:  04/04/2018 FINDINGS: Prior CABG. Left AICD remains in place, unchanged. Cardiomegaly. Low lung volumes without confluent opacity or effusion. No pneumothorax. IMPRESSION: Low lung volumes, cardiomegaly.  No active disease. Electronically Signed   By: Rolm Baptise M.D.   On: 04/05/2018 10:33   Dg Shoulder Left  Result Date: 04/03/2018 CLINICAL DATA:  Left shoulder pain after fall 2 weeks ago. EXAM: LEFT SHOULDER - 2+ VIEW COMPARISON:  Radiographs of February 17, 2006. FINDINGS: There is no evidence of fracture or dislocation. Mild degenerative changes seen involving the left acromioclavicular joint. Soft tissues are unremarkable. IMPRESSION: Mild degenerative joint disease of left acromioclavicular joint. No acute abnormality seen in the left shoulder. Electronically Signed   By: Marijo Conception, M.D.   On: 04/03/2018 09:27   [place] Ekg Site Rite  Result Date: 04/11/2018 If Site Rite image not attached, placement could not be confirmed due to current cardiac rhythm.   Lab Data:  CBC: Recent Labs  Lab 04/06/18 0331 04/07/18 0257 04/10/18 0235  WBC 9.4 10.6* 10.7*  HGB 8.3* 9.1* 9.3*  HCT 26.4* 29.5* 31.6*  MCV 80.5 81.0 84.9  PLT 237 270 992   Basic Metabolic Panel: Recent Labs  Lab 04/07/18 0257 04/08/18 0314 04/09/18 0313 04/10/18 0235 04/11/18 0232  NA 139 138 141 140 141  K 3.9 3.6 3.8 4.0 3.9  CL 95* 97* 99* 101 100*  CO2 31 28 32 28 32  GLUCOSE 93 161* 91 162* 224*  BUN 43* 40* 31* 21* 17  CREATININE 4.38* 3.82* 2.89* 1.92* 1.66*  CALCIUM 8.3* 8.1* 8.5* 8.4* 8.5*  PHOS  --  3.9 3.5 3.2 3.3   GFR: Estimated Creatinine Clearance: 33.2 mL/min (A) (by C-G formula based on SCr of 1.66 mg/dL (H)). Liver Function Tests: Recent Labs  Lab  04/06/18 0331 04/07/18 0257 04/08/18 0314 04/09/18 0313 04/10/18 0235 04/11/18 0232  AST 21 20  --   --   --   --   ALT 8* <5*  --   --   --   --   ALKPHOS 114 141*  --   --   --   --   BILITOT 1.0 1.0  --   --   --   --   PROT 6.5 6.7  --   --   --   --   ALBUMIN 2.6* 2.7* 2.5* 2.5* 2.5* 2.5*   No results for input(s): LIPASE, AMYLASE in the last 168 hours. No results for input(s): AMMONIA in the last 168 hours. Coagulation Profile: No results for input(s): INR, PROTIME in the last 168 hours. Cardiac Enzymes: No results for input(s): CKTOTAL, CKMB, CKMBINDEX, TROPONINI in the last 168 hours. BNP (last 3 results) No results for input(s): PROBNP in the last 8760 hours. HbA1C: No results for input(s): HGBA1C in the last 72 hours. CBG: Recent Labs  Lab 04/10/18 0918 04/10/18 1127 04/10/18 1653 04/10/18 2116 04/11/18 0757  GLUCAP 139* 118* 158* 260* 173*   Lipid Profile: No results for input(s): CHOL, HDL, LDLCALC, TRIG, CHOLHDL, LDLDIRECT in the last 72 hours. Thyroid Function Tests: No results for input(s): TSH, T4TOTAL, FREET4, T3FREE, THYROIDAB in the last 72 hours. Anemia Panel: No results for input(s): VITAMINB12, FOLATE, FERRITIN, TIBC, IRON, RETICCTPCT in the last 72 hours. Urine analysis:    Component Value Date/Time   COLORURINE AMBER (A) 04/11/2018 0610   APPEARANCEUR CLEAR 04/11/2018 0610   LABSPEC 1.017 04/11/2018 0610   PHURINE 5.0 04/11/2018 0610   GLUCOSEU >=500 (A) 04/11/2018 4268  HGBUR NEGATIVE 04/11/2018 0610   BILIRUBINUR NEGATIVE 04/11/2018 0610   KETONESUR NEGATIVE 04/11/2018 0610   PROTEINUR 30 (A) 04/11/2018 0610   NITRITE NEGATIVE 04/11/2018 0610   LEUKOCYTESUR NEGATIVE 04/11/2018 0610     Ripudeep Rai M.D. [facility] 04/11/2018, 10:59 AM  Pager: [phone] Between 7am to 7pm - call Pager - [phone]  After 7pm go to [email or web address] - password [email or web address]  Call night coverage person covering after 7pm

## 2018-04-11 NOTE — Care Management Note (Signed)
Case Management Note  Patient Details  Name: Victor [name] MRN: [id number] Date of Birth: 1936-05-25  Subjective/Objective:    Pt admitted with sternal area wound - pt is s/p CABG                 Action/Plan:  PTA independent from home.  Pt from home alone - states neighbors are support system.  Pt has walker, cane and scooter in the home.  Pt has home oxygen 3 liter continious supplied via [facility].  Plan is for pt to have I&D 5/17.  Pt may discharge with a wound vac - CM will continue to follow for discharge needs   Expected Discharge Date:                  Expected Discharge Plan:  [place]  In-House Referral:     Discharge planning Services  CM Consult  Post Acute Care Choice:    Choice offered to:     DME Arranged:    DME Agency:     HH Arranged:    [place] Agency:     Status of Service:     If discussed at [employer], dates discussed:    Additional Comments: 04/11/2018  LTACH referral approved by both physician advisor and attending - verbal [facility] referral given.  CM discussed discharging to [facility] - pt is in agreement.  CM offered choice and allowed both facilities to meet with pt and discuss options.    04/10/18 CM requested LTACH referral review with physician advisor. At this time, both wound vacs have been removed - may go to OR to have it replaced.  Pt remains on IV antibiotics.  CSW following for probable discharge to SNF - per cardiothoracic surgery  04/06/18 Cardiothoracic PA informed CM that the plan will be for pt to return to OR for an additional debridement.  Per attending - service will revisit need for wound vac post upcoming I&D - CM requested that CM consult be written for DME when deemed appropriate.  Pt requested CM to return at another time to discuss [facility] agency  04/05/18 Pt is now s/p I&D - 2 wound vacs applied.  Pt requiring IV antibiotic - may need long term - [facility] following.  Determination has not been made regarding wound  vac being needed at discharge (CM requested consult if wound vac will be necessary at discharge).  CM offered choice for [facility] including HHRN - pt will discuss with son and inform CM of choice of agency.    Maryclare Labrador, RN 04/11/2018, 2:18 PM

## 2018-04-11 NOTE — Progress Notes (Signed)
Visited with [name] briefly.  He states he had a rough night and does want to sleep.  He states that he appreciated me coming by and would love a visit later.  Just for the brief moment we spoke, it was so pleasant.  So thankful and hopefull will get to return to visit.  Thank you to the medical team caring for him.    04/11/18 1546  Clinical Encounter Type  Visited With Patient  Visit Type Follow-up;Spiritual support

## 2018-04-11 NOTE — Progress Notes (Signed)
At bedside with the primary nurse to place PICC.Pt refused ,does not want PICC at this time.RN aware and will notify MD.

## 2018-04-11 NOTE — Progress Notes (Addendum)
      [address]       [place] [address]             [phone]      1 Day Post-Op Procedure(s) (LRB): WOUND VAC CHANGE (N/A) Subjective: Patient is sleepy this morning. Responded by nodding and not opening eyes.   Objective: Vital signs in last 24 hours: Temp:  [97.4 F (36.3 C)-97.9 F (36.6 C)] 97.8 F (36.6 C) (05/22 0337) Pulse Rate:  [53-69] 69 (05/22 0337) Cardiac Rhythm: Normal sinus rhythm;Heart block (05/22 0700) Resp:  [12-23] 20 (05/22 0337) BP: (81-181)/(47-69) 81/50 (05/22 0337) SpO2:  [93 %-100 %] 100 % (05/22 0337) Weight:  [149 lb 14.6 oz (68 kg)] 149 lb 14.6 oz (68 kg) (05/22 0337)     Intake/Output from previous day: 05/21 0701 - 05/22 0700 In: 650 [P.O.:100; I.V.:400; IV Piggyback:100] Out: 1085 [Urine:1075; Blood:10] Intake/Output this shift: No intake/output data recorded.  General appearance:  cooperative and no distress Heart: regular rate and rhythm, S1, S2 normal, no murmur, click, rub or gallop Lungs: clear to auscultation bilaterally Abdomen: soft, non-tender; bowel sounds normal; no masses,  no organomegaly Extremities: extremities normal, atraumatic, no cyanosis or edema Wound: dressed wet-to-dry  Lab Results: Recent Labs    04/10/18 0235  WBC 10.7*  HGB 9.3*  HCT 31.6*  PLT 267   BMET:  Recent Labs    04/10/18 0235 04/11/18 0232  NA 140 141  K 4.0 3.9  CL 101 100*  CO2 28 32  GLUCOSE 162* 224*  BUN 21* 17  CREATININE 1.92* 1.66*  CALCIUM 8.4* 8.5*    PT/INR: No results for input(s): LABPROT, INR in the last 72 hours. ABG    Component Value Date/Time   PHART 7.420 04/06/2018 0415   HCO3 31.4 (H) 04/06/2018 0415   TCO2 32 03/20/2015 1638   O2SAT 97.9 04/06/2018 0415   CBG (last 3)  Recent Labs    04/10/18 1127 04/10/18 1653 04/10/18 2116  GLUCAP 118* 158* 260*    Assessment/Plan: S/P Procedure(s) (LRB): WOUND VAC CHANGE (N/A)  1. CV-NSR in the 60s, 1st degree heart block. BP low  and high at times.  2. Pulm-tolerating room air with good oxygen saturation 3. Renal-creatinine 1.66, electrolytes well controlled 4. Continue wet to dry dressing changes  Plan: Continue medical care per primary.    LOS: 9 days    Elgie Collard 04/11/2018 Plan is to make sure wounds are responding to therapy then SNF with PICC for IV ancef next week  patient examined and medical record reviewed,agree with above note. Tharon Aquas Trigt III 04/11/2018

## 2018-04-11 NOTE — Progress Notes (Signed)
Nutrition Consult/Follow Up  DOCUMENTATION CODES:   Severe malnutrition in context of chronic illness  INTERVENTION:    Ensure Enlive po TID, each supplement provides 350 kcal and 20 grams of protein  NUTRITION DIAGNOSIS:   Severe Malnutrition related to chronic illness(CHF) as evidenced by moderate fat depletion, severe fat depletion, moderate muscle depletion, severe muscle depletion, percent weight loss, ongoing  GOAL:   Patient will meet greater than or equal to 90% of their needs, progressing   MONITOR:   PO intake, Supplement acceptance, Labs, Weight trends, Skin, I & O's  ASSESSMENT:   Victor [name] is a 82 y.o. male with medical history significant of anemia, Afib on eliquis, ischemic cardiomyopathy s/p AICD placement, CKD-3, COPD on 3L O2 via [place], CAD, s/p CABG, HTN, HLD, PVD, DM2, stroke, gerd, depression, CHF with EF 30%, OSA not on CPAP, who presents with pain, erythema in LUQ abdominal surgical site and sternal area.  Initial nutrition assessment 04/04/18.  RD spoke with pt at bedside.  Reports his appetite is fair. Feels better after having a large BM last night. Ate all his oatmeal but not his eggs & pancakes b/c they were cold.  He is drinking his Ensure Enlive nutrition supplements TID. Medications include MVI, ferrous sulfate, Vitamin B12 and Colace. Labs reviewed. CBG's [phone]  Diet Order:   Diet Order           Diet renal/carb modified with fluid restriction Diet-HS Snack? Nothing; Fluid restriction: 1200 mL Fluid; Room service appropriate? Yes; Fluid consistency: Thin  Diet effective now         EDUCATION NEEDS:   Not appropriate for education at this time  Skin:  Skin Assessment: Skin Integrity Issues: Skin Integrity Issues:: Incisions Incisions: open abdomen  Last BM:  5/20  Intake/Output Summary (Last 24 hours) at 04/11/2018 1502 Last data filed at 04/11/2018 0609 Gross per 24 hour  Intake 200 ml  Output 1075 ml  Net -875 ml    Height:   Ht Readings from Last 1 Encounters:  04/03/18 5\' 7"  (1.702 m)   Weight:   Wt Readings from Last 1 Encounters:  04/11/18 149 lb 14.6 oz (68 kg)   Ideal Body Weight:  67.3 kg  BMI:  Body mass index is 23.48 kg/m.  Estimated Nutritional Needs:   Kcal:  1900-2100  Protein:  95-110 grams  Fluid:  > 1.9 L  Arthur Holms, RD, LDN Pager #: [phone] After-Hours Pager #: [phone]

## 2018-04-11 NOTE — Consult Note (Signed)
Consultation Note Date: 04/11/2018   Patient Name: Victor [name]  DOB: 1936-05-06  MRN: [id number]  Age / Sex: 82 y.o., male  PCP: Cyndi Bender, PA-C Referring Physician: Mendel Corning, MD  Reason for Consultation: Establishing goals of care  HPI/Patient Profile: 82 y.o. male  with past medical history of anemia, Afib on eliquis, ischemic cardiomyopathy s/p AICD placement, CKD 3, COPD on 3L at home, CAD s/p CABG, HTN, HLD, PVD, T2DM, CVA, GERD, depression, CHF (EF 30%), and OSA not on CPAP admitted on 04/02/2018 with pain and erythema in LUQ abdominal surgical site and sternum. Patient has been taken to the OR twice for sternal and abdominal wall abscess, had wound vac for short time - now d/c'd as patient unable to tolerate dressing changes. Also, experienced AKI during admission - improving. Patient has been slow to improve and not participating in physical therapy. PMT consulted for [place].  Clinical Assessment and Goals of Care: I have reviewed medical records including EPIC notes, labs and imaging, assessed the patient and then met at the bedside with the patient  to discuss diagnosis prognosis, GOC, EOL wishes, disposition and options.  I introduced Palliative Medicine as specialized medical care for people living with serious illness. It focuses on providing relief from the symptoms and stress of a serious illness. The goal is to improve quality of life for both the patient and the family.  We discussed a brief life review of the patient. 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 He owns horses and other pets that are important to him. His first wife passed away d/t renal failure and his second died tragically in a vehicle accident 2 years ago. The patient has 3 daughters. One passed away d/t medical issues, one is very ill (on TPN?) and lives in [place], and the third lives at the [place] but he does  not see her often. He tells me his grandson is his 32 but his grandson does not live close (in [place]). His neighbors and friends are his biggest sources of support. He tells me "please don't make decisions with my grandson while I'm still able to make them for myself". Assured him he was his own decision maker as long as he is able. Did not schedule family meeting per patient request/family not available during day d/t work.  As far as functional and nutritional status, he tells me he was independent prior to admission. He current status is much different than baseline. He has required rehab admissions before after hospitalizations.    We discussed his current illness and what it means in the larger context of his on-going co-morbidities.  Natural disease trajectory was discussed. We discussed his physical deconditioning and need for PT/rehab depending on his goals.   I attempted to elicit values and goals of care important to the patient. Quality of life is important to him. He wants to participate in rehab to get as strong as possible.   The difference between aggressive medical intervention and comfort care was considered in light of the patient's goals of care.   Advanced directives, concepts specific to code status, artifical feeding and hydration, and rehospitalization were considered and discussed. Confirmed that grandson, Victor [name] is HCPOA, however, patient would not like him involved in current decisions as he is able to make them for himself. Discussed code status, he elects DNR.  Hospice and Palliative Care services outpatient were explained and offered. Patient would like to  be followed by palliative outpatient.   Questions and concerns were addressed. The family was encouraged to call with questions or concerns.   Primary Decision Maker PATIENT   Victor [name] is HCPOA if patient unable to make decisions  SUMMARY OF RECOMMENDATIONS   -code status changed to DNR -palliative care  to see in SNF rehab - **MD please write for outpatient palliative in discharge summary** - patient agrees to participate in therapy, agrees to rehab placement  - today patient is eating and ready to get out of bed, he is hopeful for improvement and wants to continue current treatment - wants to get as strong as possible prior to discharge - please call PMT if needed for further assistance, will shadow chart  Code Status/Advance Care Planning:  DNR  Symptom Management:   Tells me his pain is controlled with current regimen, denies nausea currently - controlled by zofran and phenergan  C/o constipation - changed senna to 2 tabs BID  Palliative Prophylaxis:   Aspiration, Bowel Regimen, Delirium Protocol, Frequent Pain Assessment, Oral Care and Turn Reposition  Additional Recommendations (Limitations, Scope, Preferences):  Full Scope Treatment, DNR  Psycho-social/Spiritual:   Desire for further Chaplaincy support:yes  Additional Recommendations: N/A  Prognosis:   Unable to determine  Discharge Planning: [place] for rehab with Palliative care service follow-up      Primary Diagnoses: Present on Admission: . COPD (chronic obstructive pulmonary disease) ([place]) . Abscess of chest wall . Sepsis ([place]) . Type II diabetes mellitus with renal manifestations ([place]) . Automatic implantable cardioverter-defibrillator in situ . Essential hypertension . GERD (gastroesophageal reflux disease) . Stage 3 chronic kidney disease ([place]) . Atrial fibrillation, chronic ([place]) . HLD (hyperlipidemia) . Stroke ([place]) . CAD (coronary artery disease) . Fall . Chronic systolic CHF (congestive heart failure) ([place]) . Abdominal wall fluid collections . MSSA (methicillin susceptible Staphylococcus aureus) infection   I have reviewed the medical record, interviewed the patient and family, and examined the patient. The following aspects are pertinent.  Past Medical History:    Diagnosis Date  . Anemia   . Arthritis   . Atrial fibrillation, persistent ([place]) 12/15/2017  . Cardiomyopathy, ischemic    a. s/p ICD placement  . Chronic combined systolic and diastolic CHF (congestive heart failure) ([place])   . CKD (chronic kidney disease), stage III ([place])   . COPD (chronic obstructive pulmonary disease) ([place])   . Coronary artery disease    a. s/p CABG 1990. b. multiple PCIs since that time including CTO angioplasty (without stenting) PCI of RCA 09/2014, with last cath 02/2015 done for worsening CHF with unsuccessful attempt at PCI of the RCA due to inability to cross occlusion - medical therapy advised at that time.  . Debilitated 12/15/2017  . GERD (gastroesophageal reflux disease)   . HLD (hyperlipidemia)   . Hypertension   . Occlusion and stenosis of carotid artery without mention of cerebral infarction   . On home oxygen therapy    "2L; 24/7" (11/08/2017)  . Orthostatic hypotension   . Paroxysmal ventricular tachycardia ([place])   . Peripheral vascular disease ([place])   . Raynaud's syndrome   . Stroke [facility])    a. CT head 06/2015: Small old lacunar infarct LEFT basal ganglia.  . Thrombocytopenia ([place])   . TIA (transient ischemic attack)    a. s/p LHC on 10/01/14   . Type II diabetes mellitus ([place])    Social History   Socioeconomic History  . Marital status: Widowed    Spouse  name: Not on file  . Number of children: Not on file  . Years of education: Not on file  . Highest education level: Not on file  Occupational History  . Not on file  Social Needs  . Financial resource strain: Not on file  . Food insecurity:    Worry: Not on file    Inability: Not on file  . Transportation needs:    Medical: Not on file    Non-medical: Not on file  Tobacco Use  . Smoking status: Former Smoker    Years: 3.00    Types: Pipe  . Smokeless tobacco: Never Used  . Tobacco comment: "quit smoking pipe in early 1970's"  Substance and Sexual Activity  . Alcohol use: Yes     Comment: 11/08/2017 "wine w/dinner maybe once/yr; or less"  . Drug use: No  . Sexual activity: Never  Lifestyle  . Physical activity:    Days per week: Not on file    Minutes per session: Not on file  . Stress: Not on file  Relationships  . Social connections:    Talks on phone: Not on file    Gets together: Not on file    Attends religious service: Not on file    Active member of club or organization: Not on file    Attends meetings of clubs or organizations: Not on file    Relationship status: Not on file  Other Topics Concern  . 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 Has 3 daughters. 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    Family History  Problem Relation Age of Onset  . Heart failure Mother 55  . Heart attack Father   . Leukemia Sister   . Stomach cancer Maternal Grandfather    Scheduled Meds: . amiodarone  200 mg Oral Daily  . bisacodyl  10 mg Oral Daily  . bisoprolol  2.5 mg Oral Daily  . calcium-vitamin D  1 tablet Oral QHS  . darbepoetin (ARANESP) injection - NON-DIALYSIS  100 mcg Subcutaneous Q Fri-1800  . docusate sodium  100 mg Oral QHS  . DULoxetine  30 mg Oral Daily  . feeding supplement (ENSURE ENLIVE)  237 mL Oral TID BM  . ferrous sulfate  325 mg Oral BID WC  . insulin aspart  0-9 Units Subcutaneous TID WC  . insulin detemir  7 Units Subcutaneous BH-q7a  . mirtazapine  7.5 mg Oral QHS  . multivitamin with minerals  1 tablet Oral Daily  . pantoprazole  40 mg Oral Daily  . polyethylene glycol  17 g Oral TID  . pravastatin  40 mg Oral q1800  . senna-docusate  1 tablet Oral BID  . sodium phosphate  1 enema Rectal Once  . vitamin B-12  100 mcg Oral Daily   Continuous Infusions: .  ceFAZolin (ANCEF) IV Stopped (04/11/18 0550)   PRN Meds:.acetaminophen, albuterol, guaiFENesin-dextromethorphan, hydrALAZINE, methocarbamol, nitroGLYCERIN, ondansetron  (ZOFRAN) IV, oxyCODONE-acetaminophen, promethazine, sodium phosphate, URELLE Allergies  Allergen Reactions  . Coreg [Carvedilol] Other (See Comments)    Shortness of breath ,fatigue ,dizzyness   . Lovastatin Other (See Comments)    CK elevation, Myalgias  . Ativan [Lorazepam] Other (See Comments)    Pt. had side effects   Review of Systems  Constitutional: Positive for activity change.  Gastrointestinal: Positive for constipation.  All other systems reviewed and are negative.  Physical Exam  Constitutional: He is oriented to person, place, and time. He appears well-developed and well-nourished. No distress.  HENT:  Head: Normocephalic and atraumatic.  Cardiovascular: Normal rate and regular rhythm.  Pulmonary/Chest: Effort normal. No respiratory distress.  Abdominal: Soft.  Neurological: He is alert and oriented to person, place, and time.  Skin: Skin is warm and dry. He is not diaphoretic.  Psychiatric: He has a normal mood and affect. His behavior is normal.    Vital Signs: BP (!) 146/52 (BP Location: Right Wrist)   Pulse 69   Temp 97.7 F (36.5 C) (Oral)   Resp 20   Ht '5\' 7"'$  (1.702 m)   Wt 68 kg (149 lb 14.6 oz)   SpO2 100%   BMI 23.48 kg/m  Pain Scale: 0-10 POSS *See Group Information*: S-Acceptable,Sleep, easy to arouse Pain Score: Asleep   SpO2: SpO2: 100 % O2 Device:SpO2: 100 % O2 Flow Rate: .O2 Flow Rate (L/min): 2 L/min  IO: Intake/output summary:   Intake/Output Summary (Last 24 hours) at 04/11/2018 1011 Last data filed at 04/11/2018 6825 Gross per 24 hour  Intake 200 ml  Output 1075 ml  Net -875 ml    LBM: Last BM Date: 04/09/18 Baseline Weight: Weight: 65.8 kg (145 lb) Most recent weight: Weight: 68 kg (149 lb 14.6 oz)     Palliative Assessment/Data: PPS 50%    Time Total: 70 minutes Greater than 50%  of this time was spent counseling and coordinating care related to the above assessment and plan.  Juel Burrow, DNP, AGNP-C Palliative  Medicine Team [phone]

## 2018-04-11 NOTE — Progress Notes (Signed)
PT Cancellation Note  Patient Details Name: RODGERS LIKES MRN: [id number] DOB: 1936/06/04   Cancelled Treatment:     Pt visiting with grandson right now, refusing therapy but agreeable to work tomorrow. "I don't get to see this man very much and i'd rather spend time with him right now".   Reinaldo Berber, PT, DPT Acute Rehab Services Pager: [phone]     Reinaldo Berber 04/11/2018, 6:30 PM

## 2018-04-11 NOTE — Progress Notes (Signed)
Attempted to visit with the patient this morning.  Patient is asleep and according to the tech, wanted to sleep and just fell asleep.  I noticed from the notes that the patient does want to see a Chaplain so I will check back with him this afternoon and see if he would like a visit.    04/11/18 1234  Clinical Encounter Type  Visited With Health care provider;Patient not available  Visit Type Initial;Spiritual support

## 2018-04-11 NOTE — Care Management Important Message (Signed)
Important Message  Patient Details  Name: Victor [name] MRN: [id number] Date of Birth: 1936-10-04   Medicare Important Message Given:  Yes    Clea Dubach P [place] 04/11/2018, 3:20 PM

## 2018-04-11 NOTE — Progress Notes (Signed)
[facility] for Infectious Disease  Date of Admission:  04/02/2018   Total days of antibiotics 9         Day 9 Ancef                 Patient ID: Victor [name] is a 82 y.o. male POD #6 following sternal/abdominal I&D for recurrent MSSA infection involving AICD pocket/retained wire components.   Principal Problem:   MSSA (methicillin susceptible Staphylococcus aureus) infection Active Problems:   Automatic implantable cardioverter-defibrillator in situ   Abdominal wall fluid collections   Abscess of chest wall   Type II diabetes mellitus with renal manifestations (HCC)   Chronic systolic CHF (congestive heart failure) (HCC)   Essential hypertension   GERD (gastroesophageal reflux disease)   Hx of CABG 1990   Stage 3 chronic kidney disease (HCC)   COPD (chronic obstructive pulmonary disease) (HCC)   Sepsis (HCC)   Atrial fibrillation, chronic (HCC)   HLD (hyperlipidemia)   Stroke (HCC)   CAD (coronary artery disease)   Fall   Protein-calorie malnutrition, severe   . amiodarone  200 mg Oral Daily  . bisacodyl  10 mg Oral Daily  . bisoprolol  2.5 mg Oral Daily  . calcium-vitamin D  1 tablet Oral QHS  . darbepoetin (ARANESP) injection - NON-DIALYSIS  100 mcg Subcutaneous Q Fri-1800  . docusate sodium  100 mg Oral QHS  . DULoxetine  30 mg Oral Daily  . feeding supplement (ENSURE ENLIVE)  237 mL Oral TID BM  . ferrous sulfate  325 mg Oral BID WC  . insulin aspart  0-9 Units Subcutaneous TID WC  . insulin detemir  7 Units Subcutaneous BH-q7a  . mirtazapine  7.5 mg Oral QHS  . multivitamin with minerals  1 tablet Oral Daily  . pantoprazole  40 mg Oral Daily  . polyethylene glycol  17 g Oral TID  . pravastatin  40 mg Oral q1800  . senna-docusate  2 tablet Oral BID  . sodium phosphate  1 enema Rectal Once  . vitamin B-12  100 mcg Oral Daily    SUBJECTIVE: Feeling better. Had a BM last night and abdominal pain/nausea much improved as well as since stopping  Rifampin. Feels he will need SNF/Rehab after hospitalization to help with wound care needs, IV medications and rehabilitation.     Allergies  Allergen Reactions  . Coreg [Carvedilol] Other (See Comments)    Shortness of breath ,fatigue ,dizzyness   . Lovastatin Other (See Comments)    CK elevation, Myalgias  . Ativan [Lorazepam] Other (See Comments)    Pt. had side effects    OBJECTIVE: Vitals:   04/10/18 1950 04/11/18 0001 04/11/18 0337 04/11/18 0841  BP: (!) 146/59 (!) 147/49 (!) 81/50 (!) 146/52  Pulse: 65 66 69   Resp: 19 (!) 23 20   Temp: 97.9 F (36.6 C) 97.9 F (36.6 C) 97.8 F (36.6 C) 97.7 F (36.5 C)  TempSrc: Oral Oral Oral Oral  SpO2: 97% 99% 100%   Weight:   149 lb 14.6 oz (68 kg)   Height:       Body mass index is 23.48 kg/m.  Physical Exam  Constitutional: He is oriented to person, place, and time.  Resting comfortably in bed.   HENT:  Mouth/Throat: No oral lesions. Normal dentition. No dental caries.  Poor dentition with multiple carries and necrotic teeth.   Eyes: No scleral icterus.  Cardiovascular: Normal rate, regular  rhythm and intact distal pulses.  Murmur heard. Pulmonary/Chest: Effort normal and breath sounds normal. No respiratory distress.  Abdominal: Soft. Bowel sounds are normal. He exhibits no distension. There is no tenderness. There is no guarding.  Dressing to upper abdomen clean and dry.  Lower sternal dressing with some red drainage marked   Lymphadenopathy:    He has no cervical adenopathy.  Neurological: He is alert and oriented to person, place, and time.  Skin: Skin is warm and dry. Capillary refill takes less than 2 seconds. No rash noted.  Psychiatric: He has a normal mood and affect. Judgment normal.  Vitals reviewed.   Lab Results Lab Results  Component Value Date   WBC 10.7 (H) 04/10/2018   HGB 9.3 (L) 04/10/2018   HCT 31.6 (L) 04/10/2018   MCV 84.9 04/10/2018   PLT 267 04/10/2018    Lab Results  Component Value  Date   CREATININE 1.66 (H) 04/11/2018   BUN 17 04/11/2018   NA 141 04/11/2018   K 3.9 04/11/2018   CL 100 (L) 04/11/2018   CO2 32 04/11/2018    Lab Results  Component Value Date   ALT <5 (L) 04/07/2018   AST 20 04/07/2018   ALKPHOS 141 (H) 04/07/2018   BILITOT 1.0 04/07/2018     Microbiology: BCx 5/13 > NG  Surgical Culture 5/16 > pending   ASSESSMENT: 82 y.o. male with history of MSSA infection of rectus sheath muscle / intraabdominal abscess at the site of previous AICD generator. POD6 debridement of both sites. Wet to dry dressings following VAC therapy now in place for wound care. Unfortunately he was not able to tolerate Rifampin therapy. Will plan to continue cefazolin IV renally adjusted for 6 weeks with plans for long term oral keflex afterward. With improving creatinine and nephrology signed off would anticipate conventional PICC placement OK - discussed with Dr. Tana Coast.   Acute Kidney Injury - Ancef 2gm Q12h. Will alert our OPAT pharmacist to monitor for continued improvement/dose adjustment.    PLAN: 1. To place PICC - discussed with patient  2. Agree with SNF for discharge to help manage multiple health needs  3. OPAT orders as detailed below    OPAT ORDERS:  Diagnosis: MSSA abscess / AICD retained wires   Culture Result: MSSA   Allergies  Allergen Reactions  . Coreg [Carvedilol] Other (See Comments)    Shortness of breath ,fatigue ,dizzyness   . Lovastatin Other (See Comments)    CK elevation, Myalgias  . Ativan [Lorazepam] Other (See Comments)    Pt. had side effects    Discharge antibiotics: Cefazolin 2gm Q12h (AKI improving to baseline)  *unable to tolerate rifampin therapy   Duration: 6 weeks   End Date: July 13th   [place] and Maintenance Per Protocol _x_ Please pull PIC at completion of IV antibiotics   Labs weekly while on IV antibiotics: _x_ CBC with differential _x_ BMP   Fax weekly labs to [phone]  Clinic Follow Up  Appt: Dr. Baxter Flattery July 10th @ 10:30 am    Janene Madeira, MSN, NP-C [facility] for Infectious [place] Cell: [phone] Pager: [phone]  04/11/2018  11:06 AM

## 2018-04-12 ENCOUNTER — Inpatient Hospital Stay (HOSPITAL_COMMUNITY): Payer: Medicare Other

## 2018-04-12 DIAGNOSIS — R110 Nausea: Secondary | ICD-10-CM

## 2018-04-12 LAB — GLUCOSE, CAPILLARY
GLUCOSE-CAPILLARY: 129 mg/dL — AB (ref 65–99)
GLUCOSE-CAPILLARY: 146 mg/dL — AB (ref 65–99)
Glucose-Capillary: 135 mg/dL — ABNORMAL HIGH (ref 65–99)
Glucose-Capillary: 71 mg/dL (ref 65–99)

## 2018-04-12 LAB — RENAL FUNCTION PANEL
ALBUMIN: 2.5 g/dL — AB (ref 3.5–5.0)
Anion gap: 7 (ref 5–15)
BUN: 13 mg/dL (ref 6–20)
CALCIUM: 8.5 mg/dL — AB (ref 8.9–10.3)
CO2: 33 mmol/L — AB (ref 22–32)
Chloride: 100 mmol/L — ABNORMAL LOW (ref 101–111)
Creatinine, Ser: 1.29 mg/dL — ABNORMAL HIGH (ref 0.61–1.24)
GFR calc Af Amer: 59 mL/min — ABNORMAL LOW (ref >60)
GFR calc non Af Amer: 51 mL/min — ABNORMAL LOW (ref >60)
GLUCOSE: 198 mg/dL — AB (ref 65–99)
PHOSPHORUS: 2.8 mg/dL (ref 2.5–4.6)
Potassium: 3.9 mmol/L (ref 3.5–5.1)
SODIUM: 140 mmol/L (ref 135–145)

## 2018-04-12 MED ORDER — MIRTAZAPINE 7.5 MG PO TABS: 7.5000 mg | ORAL_TABLET | Freq: Every day | ORAL | Status: AC

## 2018-04-12 MED ORDER — CEFAZOLIN IV (FOR PTA / DISCHARGE USE ONLY): 2.0000 g | Freq: Two times a day (BID) | INTRAVENOUS | 0 refills | Status: DC

## 2018-04-12 MED ORDER — ASPIRIN 81 MG PO TBEC: 81.0000 mg | DELAYED_RELEASE_TABLET | Freq: Every day | ORAL | Status: DC

## 2018-04-12 MED ORDER — CEFAZOLIN IV (FOR PTA / DISCHARGE USE ONLY): 2.0000 g | Freq: Three times a day (TID) | INTRAVENOUS | Status: AC

## 2018-04-12 MED ORDER — PROMETHAZINE HCL 12.5 MG PO TABS: 12.5000 mg | ORAL_TABLET | Freq: Four times a day (QID) | ORAL | 0 refills | Status: AC | PRN

## 2018-04-12 MED ORDER — INSULIN DETEMIR 100 UNIT/ML ~~LOC~~ SOLN
10.0000 [IU] | Freq: Every day | SUBCUTANEOUS | Status: DC
Start: 2018-04-12 — End: 2018-04-13
  Administered 2018-04-12 – 2018-04-13 (×2): 10 [IU] via SUBCUTANEOUS
  Filled 2018-04-12 (×2): qty 0.1

## 2018-04-12 MED ORDER — INSULIN ASPART 100 UNIT/ML ~~LOC~~ SOLN: 0.0000 [IU] | Freq: Three times a day (TID) | SUBCUTANEOUS | 11 refills | Status: AC

## 2018-04-12 MED ORDER — URELLE 81 MG PO TABS: 1.0000 | ORAL_TABLET | Freq: Three times a day (TID) | ORAL | Status: AC | PRN

## 2018-04-12 MED ORDER — SODIUM CHLORIDE 0.9% FLUSH: 10.0000 mL | INTRAVENOUS | Status: DC | PRN

## 2018-04-12 MED ORDER — ENSURE ENLIVE PO LIQD: 237.0000 mL | Freq: Three times a day (TID) | ORAL | 12 refills | Status: AC

## 2018-04-12 MED ORDER — ASPIRIN EC 81 MG PO TBEC
81.0000 mg | DELAYED_RELEASE_TABLET | Freq: Every day | ORAL | Status: DC
  Administered 2018-04-12 – 2018-04-13 (×2): 81 mg via ORAL
  Filled 2018-04-12 (×2): qty 1

## 2018-04-12 MED ORDER — CEFAZOLIN SODIUM-DEXTROSE 2-4 GM/100ML-% IV SOLN
2.0000 g | Freq: Three times a day (TID) | INTRAVENOUS | Status: DC
Start: 2018-04-12 — End: 2018-04-13
  Administered 2018-04-12 – 2018-04-13 (×3): 2 g via INTRAVENOUS
  Filled 2018-04-12 (×4): qty 100

## 2018-04-12 MED ORDER — ALBUTEROL SULFATE (2.5 MG/3ML) 0.083% IN NEBU: 2.5000 mg | INHALATION_SOLUTION | Freq: Four times a day (QID) | RESPIRATORY_TRACT | 12 refills | Status: AC | PRN

## 2018-04-12 MED ORDER — GUAIFENESIN-DM 100-10 MG/5ML PO SYRP: 5.0000 mL | ORAL_SOLUTION | ORAL | 0 refills | Status: AC | PRN

## 2018-04-12 NOTE — Progress Notes (Addendum)
      [address]       [place] [address]             [phone]      2 Days Post-Op Procedure(s) (LRB): WOUND VAC CHANGE (N/A) Subjective: States that he is having pain in his feet which is pulsating.   Objective: Vital signs in last 24 hours: Temp:  [97.4 F (36.3 C)-98.1 F (36.7 C)] 98.1 F (36.7 C) (05/23 0741) Pulse Rate:  [65-73] 71 (05/23 0741) Cardiac Rhythm: Normal sinus rhythm (05/23 0753) Resp:  [16-21] 18 (05/23 0741) BP: (112-171)/(54-98) 164/66 (05/23 0741) SpO2:  [100 %] 100 % (05/23 0741) Weight:  [134 lb 7.7 oz (61 kg)] 134 lb 7.7 oz (61 kg) (05/23 0254)     Intake/Output from previous day: 05/22 0701 - 05/23 0700 In: 440 [P.O.:240; IV Piggyback:200] Out: 1100 [Urine:1100] Intake/Output this shift: No intake/output data recorded.  General appearance: alert, cooperative and no distress Heart: regular rate and rhythm, S1, S2 normal, no murmur, click, rub or gallop Lungs: clear to auscultation bilaterally Abdomen: soft, non-tender; bowel sounds normal; no masses,  no organomegaly Extremities: good distal pulses Wound: covered with wet-to-dry dressings. They were just changed per patient  Lab Results: Recent Labs    04/10/18 0235  WBC 10.7*  HGB 9.3*  HCT 31.6*  PLT 267   BMET:  Recent Labs    04/11/18 0232 04/12/18 0246  NA 141 140  K 3.9 3.9  CL 100* 100*  CO2 32 33*  GLUCOSE 224* 198*  BUN 17 13  CREATININE 1.66* 1.29*  CALCIUM 8.5* 8.5*    PT/INR: No results for input(s): LABPROT, INR in the last 72 hours. ABG    Component Value Date/Time   PHART 7.420 04/06/2018 0415   HCO3 31.4 (H) 04/06/2018 0415   TCO2 32 03/20/2015 1638   O2SAT 97.9 04/06/2018 0415   CBG (last 3)  Recent Labs    04/11/18 1718 04/11/18 2124 04/12/18 0818  GLUCAP 156* 231* 129*    Assessment/Plan: S/P Procedure(s) (LRB): WOUND VAC CHANGE (N/A)   1. CV-NSR in the 70s, 1st degree heart block. BP slightly high this morning.    2. Pulm-tolerating room air with good oxygen saturation 3. Renal-creatinine 1.29, electrolytes well controlled 4. Continue wet to dry dressing changes  5. Foot pain-work up per primary. Patient is a diabetic but blood glucose has been pretty well controlled.  6. Was on anticoagulation on admission, Apixaban then coumadin for a. Fib. I do not think he needs it at this moment since he is in NSR. I will add back baby aspirin since he is post-op CABG.   Plan: PICC line placement today for IV Ancef.     LOS: 10 days    Victor [name] 04/12/2018  continue to hold Eliquis while fresh wounds are being packed- resume at DC to SNF Lovenox prophylaxis now  patient examined and medical record reviewed,agree with above note. Victor [name] 03/21/2018

## 2018-04-12 NOTE — Progress Notes (Signed)
Peripherally Inserted Central Catheter/Midline Placement  The IV Nurse has discussed with the patient and/or persons authorized to consent for the patient, the purpose of this procedure and the potential benefits and risks involved with this procedure.  The benefits include less needle sticks, lab draws from the catheter, and the patient may be discharged home with the catheter. Risks include, but not limited to, infection, bleeding, blood clot (thrombus formation), and puncture of an artery; nerve damage and irregular heartbeat and possibility to perform a PICC exchange if needed/ordered by physician.  Alternatives to this procedure were also discussed.  Bard Power PICC patient education guide, fact sheet on infection prevention and patient information card has been provided to patient /or left at bedside.    PICC/Midline Placement Documentation  PICC Single Lumen 49/70/26 PICC Right Basilic 34 cm 1 cm (Active)  Indication for Insertion or Continuance of Line Prolonged intravenous therapies 04/12/2018  9:20 AM  Exposed Catheter (cm) 1 cm 04/12/2018  9:20 AM  Site Assessment Clean;Dry;Intact 04/12/2018  9:20 AM  Line Status Flushed;Blood return noted;Saline locked 04/12/2018  9:20 AM  Dressing Type Transparent 04/12/2018  9:20 AM  Dressing Status Clean;Dry;Intact 04/12/2018  9:20 AM  Dressing Intervention New dressing 04/12/2018  9:20 AM  Dressing Change Due 04/19/18 04/12/2018  9:20 AM       [address] 04/12/2018, 10:44 AM

## 2018-04-12 NOTE — Care Management Note (Signed)
Case Management Note  Patient Details  Name: Victor [name] MRN: [id number] Date of Birth: 08/26/1936  Subjective/Objective:    Pt admitted with sternal area wound - pt is s/p CABG                 Action/Plan:  PTA independent from home.  Pt from home alone - states neighbors are support system.  Pt has walker, cane and scooter in the home.  Pt has home oxygen 3 liter continious supplied via [facility].  Plan is for pt to have I&D 5/17.  Pt may discharge with a wound vac - CM will continue to follow for discharge needs   Expected Discharge Date:  04/12/18               Expected Discharge Plan:  [place]  In-House Referral:     Discharge planning Services  CM Consult  Post Acute Care Choice:    Choice offered to:     DME Arranged:    DME Agency:     HH Arranged:    HH Agency:     Status of Service:     If discussed at [employer], dates discussed:    Additional Comments: 04/12/2018  Pt will discharge to [facility] today.  Pt chose [facility] - agency aware and can offer pt a bed today.  [facility] also aware of pts choice   04/11/18 LTACH referral approved by both physician advisor and attending - verbal [facility] referral given.  CM discussed discharging to [facility] - pt is in agreement.  CM offered choice and allowed both facilities to meet with pt and discuss options.    04/10/18 CM requested LTACH referral review with physician advisor. At this time, both wound vacs have been removed - may go to OR to have it replaced.  Pt remains on IV antibiotics.  CSW following for probable discharge to SNF - per cardiothoracic surgery  04/06/18 Cardiothoracic PA informed CM that the plan will be for pt to return to OR for an additional debridement.  Per attending - service will revisit need for wound vac post upcoming I&D - CM requested that CM consult be written for DME when deemed appropriate.  Pt requested CM to return at another time to discuss [facility] agency  04/05/18 Pt is now  s/p I&D - 2 wound vacs applied.  Pt requiring IV antibiotic - may need long term - [facility] following.  Determination has not been made regarding wound vac being needed at discharge (CM requested consult if wound vac will be necessary at discharge).  CM offered choice for [facility] including HHRN - pt will discuss with son and inform CM of choice of agency.    Maryclare Labrador, RN 04/12/2018, 11:11 AM

## 2018-04-12 NOTE — Consult Note (Signed)
   [facility] CM Inpatient Consult   04/12/2018  LATHANIEL LEGATE [birth year] [phone]  Patient screened for review of  high risk score for unplanned readmission (45% Extreme) and multiple hospitalizations in the past 6 months in [place] Management plan with Medicare.  Chart review of inpatient care management is the patient to go to [facility].  No current [facility] Care Management needs for community follow up at this time, as current plan is for long term hospital.  Primary Care Provider listed as Cyndi Bender, PA-C of [facility], which is listed to provide the transition of care follow up.  Ffor questions contact:   Natividad Brood, RN BSN [facility] Liaison  [phone] business mobile phone Toll free office [phone]

## 2018-04-12 NOTE — Progress Notes (Signed)
Physical Therapy Treatment Patient Details Name: Victor [name] MRN: [id number] DOB: 04/16/1936 Today's Date: 04/12/2018    History of Present Illness  Victor [name] is a 82 y.o. male with sternal and abdominal wall abscess s/p I&D and had wound VAC but removed due to pain. PMHx: anemia, Afib on eliquis, ICM s/p AICD placement, CKD-3, COPD on 3L O2 via [place], CAD, s/p CABG, HTN, HLD, PVD, DM2, stroke, gerd, depression, CHF with EF 30%, OSA not on CPAP    PT Comments    Pt reports that he does not feel positive today but that he is willing to participate with therapy. Session focused on transfers and gait to improve endurance and activity tolerance. Pt reports neck pain with 100 ft ambulation with SpO2% remaining at 100% throughout session. Pt likely to be D/C to [facility]. Pt decline HEP.  Follow Up Recommendations  LTACH     Equipment Recommendations  None recommended by PT    Recommendations for Other Services       Precautions / Restrictions Precautions Precautions: Fall Precaution Comments: L UQ wound Restrictions Weight Bearing Restrictions: No    Mobility  Bed Mobility Overal bed mobility: Modified Independent             General bed mobility comments: HOB flat  Transfers Overall transfer level: Needs assistance Equipment used: Rolling walker (2 wheeled) Transfers: Sit to/from Stand Sit to Stand: Supervision         General transfer comment: supervision for lines  Ambulation/Gait Ambulation/Gait assistance: Supervision Ambulation Distance (Feet): 140 Feet Assistive device: Rolling walker (2 wheeled) Gait Pattern/deviations: Step-through pattern;Decreased stride length;Trunk flexed Gait velocity: slow   General Gait Details: Pt ambulates slowly with decreased stride length and exhibits increased lumbar and cervical flexion. Pt reports after ~100 ft  that neck pain worsens with ambulation and requested to return to room    Stairs              Wheelchair Mobility    Modified Rankin (Stroke Patients Only)       Balance Overall balance assessment: Needs assistance Sitting-balance support: No upper extremity supported Sitting balance-Leahy Scale: Good     Standing balance support: No upper extremity supported;During functional activity Standing balance-Leahy Scale: Fair Standing balance comment: Pt able to stand wihout UE support but no higher level balance tested today                            Cognition Arousal/Alertness: Awake/alert Behavior During Therapy: Flat affect Overall Cognitive Status: No family/caregiver present to determine baseline cognitive functioning                                 General Comments: pt with flat affect, reporting dislike for being sent to [facility] instead of remaining in acute. He seems to have decreased awareness for his deficits      Exercises      General Comments        Pertinent Vitals/Pain Pain Assessment: 0-10 Pain Score: 5  Pain Location: Neck pain during ambulation Pain Descriptors / Indicators: Discomfort Pain Intervention(s): Limited activity within patient's tolerance;Monitored during session    Home Living                      Prior Function            PT Goals (current goals  can now be found in the care plan section) Acute Rehab PT Goals Patient Stated Goal: No goal stated today Progress towards PT goals: Progressing toward goals    Frequency    Min 2X/week      PT Plan Discharge plan needs to be updated    Co-evaluation              AM-PAC PT "6 Clicks" Daily Activity  Outcome Measure  Difficulty turning over in bed (including adjusting bedclothes, sheets and blankets)?: None Difficulty moving from lying on back to sitting on the side of the bed? : A Little Difficulty sitting down on and standing up from a chair with arms (e.g., wheelchair, bedside commode, etc,.)?: A Little Help needed moving to  and from a bed to chair (including a wheelchair)?: A Little Help needed walking in hospital room?: A Little Help needed climbing 3-5 steps with a railing? : A Little 6 Click Score: 19    End of Session Equipment Utilized During Treatment: Gait belt;Oxygen(2 L O2) Activity Tolerance: Patient tolerated treatment well Patient left: in chair;with call bell/phone within reach;with chair alarm set;Other (comment)(with x-ray tech present) Nurse Communication: Mobility status PT Visit Diagnosis: Unsteadiness on feet (R26.81);Other abnormalities of gait and mobility (R26.89)     Time: 2263-3354 PT Time Calculation (min) (ACUTE ONLY): 20 min  Charges:  $Gait Training: 8-22 mins                    G Codes:       Gabe Edward Guthmiller, SPT   [employer] 04/12/2018, 11:04 AM

## 2018-04-12 NOTE — Discharge Summary (Addendum)
Physician Discharge Summary   Patient ID: Victor [name] MRN: [id number] DOB/AGE: 27-Jul-1936 82 y.o.  Admit date: 04/02/2018 Discharge date: 04/12/2018  Primary Care Physician:  Cyndi Bender, PA-C   Recommendations for Outpatient Follow-up:  1. Continue wet-to-dry dressing changes 2. Continue cefazolin IV, duration 6 weeks, end date July 13th 3.  please pull PICC line at the completion of IV antibiotics 4. CBC with differential, BMET weekly while on antibiotics, fax weekly labs to [phone] 5. Palliative care to follow at the rehab  Home Health: None Equipment/Devices: None  Discharge Condition: stable  CODE STATUS:  DNR   Diet recommendation: Carb modified, renal with fluid restriction 1200 cc   Discharge Diagnoses:   Marland Kitchen MSSA (methicillin susceptible Staphylococcus aureus) infection . COPD (chronic obstructive pulmonary disease) ([place]) . Infected left upper quadrant rectus sheath hematoma at the site of primary ICD, stat Abdominal wall abscess . Severe protein calorie malnutrition . Type II diabetes mellitus with renal manifestations ([place]) . Automatic implantable cardioverter-defibrillator in situ . Essential hypertension . GERD (gastroesophageal reflux disease) . Stage 3 chronic kidney disease ([place]) . Atrial fibrillation, chronic ([place]) . HLD (hyperlipidemia) . Stroke ([place]) . CAD (coronary artery disease) . Fall . Acute on chronic systolic CHF (congestive heart failure) ([place])    Consults:  [facility] Infectious disease Nephrology    Allergies:   Allergies  Allergen Reactions  . Coreg [Carvedilol] Other (See Comments)    Shortness of breath ,fatigue ,dizzyness   . Lovastatin Other (See Comments)    CK elevation, Myalgias  . Ativan [Lorazepam] Other (See Comments)    Pt. had side effects     DISCHARGE MEDICATIONS: Allergies as of 04/12/2018      Reactions   Coreg [carvedilol] Other (See Comments)   Shortness of breath ,fatigue ,dizzyness    Lovastatin  Other (See Comments)   CK elevation, Myalgias   Ativan [lorazepam] Other (See Comments)   Pt. had side effects      Medication List    STOP taking these medications   apixaban 5 MG Tabs tablet Commonly known as:  ELIQUIS   doxycycline 100 MG tablet Commonly known as:  VIBRA-TABS   furosemide 40 MG tablet Commonly known as:  LASIX   lidocaine 5 % Commonly known as:  LIDODERM   linagliptin 5 MG Tabs tablet Commonly known as:  TRADJENTA   traMADol 50 MG tablet Commonly known as:  ULTRAM   VITAMIN B-12 PO     TAKE these medications   acetaminophen 325 MG tablet Commonly known as:  TYLENOL Take 2 tablets (650 mg total) by mouth every 4 (four) hours as needed for headache or mild pain.   albuterol (2.5 MG/3ML) 0.083% nebulizer solution Commonly known as:  PROVENTIL Take 3 mLs (2.5 mg total) by nebulization every 6 (six) hours as needed for wheezing or shortness of breath.   amiodarone 200 MG tablet Commonly known as:  PACERONE Take 1 tablet (200 mg total) by mouth daily.   aspirin 81 MG EC tablet Take 1 tablet (81 mg total) by mouth daily.   bisoprolol 5 MG tablet Commonly known as:  ZEBETA Take 0.5 tablets (2.5 mg total) by mouth daily.   CALCIUM PO Take 1 tablet by mouth at bedtime.   ceFAZolin IVPB Commonly known as:  ANCEF Inject 2 g into the vein every 8 (eight) hours for 69 doses. Indication:  MSSA abscess / AICD retained wires  Last Day of Therapy:  06/02/2018 Labs - Once weekly:  CBC/D and  BMP, Labs - Every other week:  ESR and CRP   docusate sodium 100 MG capsule Commonly known as:  COLACE Take 1 capsule (100 mg total) by mouth at bedtime.   DULoxetine 30 MG capsule Commonly known as:  CYMBALTA Take 30 mg by mouth daily.   feeding supplement (ENSURE ENLIVE) Liqd Take 237 mLs by mouth 3 (three) times daily between meals.   ferrous sulfate 325 (65 FE) MG tablet Take 1 tablet (325 mg total) by mouth 2 (two) times daily with a meal.    guaiFENesin-dextromethorphan 100-10 MG/5ML syrup Commonly known as:  ROBITUSSIN DM Take 5 mLs by mouth every 4 (four) hours as needed for cough.   HYDROcodone-acetaminophen 5-325 MG tablet Commonly known as:  NORCO/VICODIN Take 1 tablet by mouth every 6 (six) hours as needed for moderate pain or severe pain.   insulin aspart 100 UNIT/ML injection Commonly known as:  novoLOG Inject 0-9 Units into the skin 3 (three) times daily with meals.   insulin detemir 100 UNIT/ML injection Commonly known as:  LEVEMIR Inject 10 Units into the skin See admin instructions. 10 units in the morning. May increase as needed to keep fasting BS < 130.   methocarbamol 750 MG tablet Commonly known as:  ROBAXIN Take 1 tablet (750 mg total) by mouth every 8 (eight) hours as needed for muscle spasms.   mirtazapine 7.5 MG tablet Commonly known as:  REMERON Take 1 tablet (7.5 mg total) by mouth at bedtime.   nitroGLYCERIN 0.4 MG SL tablet Commonly known as:  NITROSTAT Place 1 tablet (0.4 mg total) under the tongue every 5 (five) minutes as needed for chest pain.   omeprazole 20 MG capsule Commonly known as:  PRILOSEC Take 20 mg by mouth every morning.   polyethylene glycol packet Commonly known as:  MIRALAX / GLYCOLAX Take 17 g by mouth daily.   pravastatin 40 MG tablet Commonly known as:  PRAVACHOL Take 1 tablet (40 mg total) by mouth daily at 6 PM.   promethazine 12.5 MG tablet Commonly known as:  PHENERGAN Take 1 tablet (12.5 mg total) by mouth every 6 (six) hours as needed for nausea or vomiting.   senna-docusate 8.6-50 MG tablet Commonly known as:  Senokot-S Take 1 tablet by mouth 2 (two) times daily.   URELLE 81 MG Tabs tablet Take 1 tablet (81 mg total) by mouth 3 (three) times daily as needed for bladder spasms.            Home Infusion Instuctions  (From admission, onward)        Start     Ordered   04/12/18 0000  Home infusion instructions [facility] May follow  [place] Dosing Protocol; May administer Cathflo as needed to maintain patency of vascular access device.; [place] of vascular access device: per [facility] Protocol: 0.9% NaCl pre/post medica...    Question Answer Comment  Instructions May follow [place] Dosing Protocol   Instructions May administer Cathflo as needed to maintain patency of vascular access device.   Instructions [place] of vascular access device: per [facility] Protocol: 0.9% NaCl pre/post medication administration and prn patency; Heparin 100 u/ml, 44m for implanted ports and Heparin 10u/ml, 540mfor all other central venous catheters.   Instructions May follow [facility] Anaphylaxis Protocol for First Dose Administration in the home: 0.9% NaCl at 25-50 ml/hr to maintain IV access for protocol meds. Epinephrine 0.3 ml IV/IM PRN and Benadryl 25-50 IV/IM PRN s/s of anaphylaxis.   Instructions [facility]  Infusion Coordinator (RN) to assist per patient IV care needs in the home PRN.      04/12/18 0748   04/12/18 0000  Home infusion instructions [facility] May follow [place] Dosing Protocol; May administer Cathflo as needed to maintain patency of vascular access device.; [place] of vascular access device: per [facility] Protocol: 0.9% NaCl pre/post medica...    Question Answer Comment  Instructions May follow [place] Dosing Protocol   Instructions May administer Cathflo as needed to maintain patency of vascular access device.   Instructions [place] of vascular access device: per [facility] Protocol: 0.9% NaCl pre/post medication administration and prn patency; Heparin 100 u/ml, 51m for implanted ports and Heparin 10u/ml, 556mfor all other central venous catheters.   Instructions May follow [facility] Anaphylaxis Protocol for First Dose Administration in the home: 0.9% NaCl at 25-50 ml/hr to maintain IV access for protocol meds. Epinephrine 0.3 ml IV/IM PRN and Benadryl 25-50 IV/IM PRN s/s of anaphylaxis.   Instructions [facility]  Infusion Coordinator (RN) to assist per patient IV care needs in the home PRN.      04/12/18 1124       Brief H and P: For complete details please refer to admission H and P, but in brief[age]ear old Caucasian male with a past medical history of COPD on home oxygen, history of coronary artery disease status post CABG, chronic systolic CHF with a EF of 30% with ICD, hypertension, stroke presented with cellulitis over the sternal area. He was seen by cardiothoracic surgery. He also developed acute renal failure. He was also seen by nephrology.    Hospital Course:    MSSA  infection, sternal and abdominal wall abscess/infected left upper quadrant rectus sheath hematoma at the site of primary ICD -Status post I&D, lavage therapy, vacuum-assisted closure device change of lower sternal and upper abdominal wounds on 5/21 -Cardiothoracic surgery was consulted, continue wet-to-dry dressing changes -[facility] -Infectious disease following, recommended continue cefazolin for 6-week, PICC line placed.  Rifampin was discontinued by [facility] due to intractable nausea.   Acute on chronic kidney disease stage III -Creatinine worsened likely due to contrast nephropathy versus hemodynamic instability. - Nephrology was consulted, hydrated, creatinine now improving baseline creatinine 1.2 - foley catheter was discontinued, creatinine peaked at 4.46 -Improved to 1.2 at the time of discharge.     Type II diabetes mellitus with renal manifestations (HCC) -CBGs reasonably well controlled, continue sliding scale insulin, Levemir -HbA1c 8.6  Acute on Chronic systolic and diastolic CHF (congestive heart failure) (HCC) -EF 30 to 35%, per 2D echo 2/19 - Continue beta-blocker, not on ACE inhibitor or ARB due to AKI, creatinine improving, may restart once back to baseline  COPD on home O2, chronic respiratory failure -Currently stable, no wheezing  Permanent  atrial fibrillation -Rate controlled, continue amiodarone, bisoprolol  - CHADS VASC 2, was on Eliquis, was held due to renal failure - Creatinine now back to baseline. [facility] recommend aspirin 8172maily     Protein-calorie malnutrition, severe Nutrition was consulted     Day of Discharge S: Eager to start rehab, no acute issues overnight.  PICC line to be placed today  BP (!) 154/89 (BP Location: Right Arm)   Pulse 68   Temp 97.6 F (36.4 C) (Oral)   Resp (!) 23   Ht _0  (1.702 m)   Wt 61 kg (134 lb 7.7 oz)   SpO2 100%   BMI 21.06 kg/m  Physical Exam: General: Alert and awake oriented x3 not in any acute distress. HEENT: anicteric sclera, pupils reactive to light and accommodation CVS: S1-S2 clear no murmur rubs or gallops Chest: clear to auscultation bilaterally, no wheezing rales or rhonchi Abdomen: soft nontender, nondistended, normal bowel sounds, dressing intact Extremities: no cyanosis, clubbing or edema noted bilaterally Neuro: Cranial nerves II-XII intact, no focal neurological deficits   The results of significant diagnostics from this hospitalization (including imaging, microbiology, ancillary and laboratory) are listed below for reference.      Procedures/Studies:  Dg Chest 2 View  Result Date: 04/04/2018 CLINICAL DATA:  Pre op chest exam. Pt states he has an infection at the site of his previous pacemaker. He has a new pacemaker at his left shoulder. EXAM: CHEST - 2 VIEW COMPARISON:  Chest x-ray dated 12/28/2017. FINDINGS: Stable cardiomegaly. No confluent opacity to suggest a developing pneumonia. No pleural effusion or pneumothorax seen. No acute or suspicious osseous finding. LEFT chest wall pacemaker/ICD apparatus appears stable in position with intact lead. IMPRESSION: No active cardiopulmonary disease. No evidence of pneumonia or pulmonary edema. Stable cardiomegaly. Electronically Signed   By: Franki Cabot M.D.   On: 04/04/2018 22:36   Ct Head Wo  Contrast  Result Date: 04/03/2018 CLINICAL DATA:  82 year old male fell 1 week ago hurting left shoulder. Bilateral neck pain. Denies injury to head or loss of consciousness. Initial encounter. EXAM: CT HEAD WITHOUT CONTRAST CT CERVICAL SPINE WITHOUT CONTRAST TECHNIQUE: Multidetector CT imaging of the head and cervical spine was performed following the standard protocol without intravenous contrast. Multiplanar CT image reconstructions of the cervical spine were also generated. COMPARISON:  07/16/2017 head CT.  10/23/2015 cervical spine CT. FINDINGS: CT HEAD FINDINGS Brain: No intracranial hemorrhage or CT evidence of large acute infarct. Chronic microvascular changes. Mild global atrophy. Left parietal 5 mm dural-based calcification may represent small meningioma without associated vasogenic edema and unchanged. Vascular: Vascular calcifications Skull: No skull fracture Sinuses/Orbits: No acute orbital abnormality. Visualized paranasal sinuses are clear. Other: Mastoid air cells and middle ear cavities are clear. CT CERVICAL SPINE FINDINGS Alignment: Alignment similar to prior cervical spine CT. Minimal anterior slip C3 and C4. Skull base and vertebrae: No cervical spine fracture. Soft tissues and spinal canal: No abnormal prevertebral soft tissue swelling. Disc levels: Transverse ligament hypertrophy. Multilevel cervical spondylotic changes C3-4 through C6-7 have progressed slightly since prior exam. Upper chest: Bilateral pleural effusions layering posteriorly. Other: Prominent caries. Pacemaker in place. Prominent carotid bifurcation calcifications. IMPRESSION: CT HEAD: No skull fracture or intracranial hemorrhage. Chronic microvascular changes. Mild global atrophy. Left parietal 5 mm dural-based calcification may represent small meningioma without associated vasogenic edema and unchanged. CT CERVICAL SPINE: Alignment similar to prior cervical spine CT. Minimal anterior slip C3 and C4. No cervical spine  fracture or abnormal prevertebral soft tissue swelling. Multilevel cervical spondylotic changes C3-4 through C6-7 have progressed slightly since prior exam. Bilateral pleural effusions layering posteriorly. Prominent carotid bifurcation calcifications. Electronically Signed   By: Genia Del M.D.   On: 04/03/2018 10:12   Ct Chest W Contrast  Result Date: 04/02/2018 CLINICAL DATA:  Recent surgery to remove infected wires from previous AICD. Also history of anal fistula repair. EXAM: CT CHEST, ABDOMEN, AND PELVIS WITH CONTRAST TECHNIQUE: Multidetector CT imaging of the chest, abdomen and pelvis was performed following the standard protocol during bolus administration of intravenous contrast. CONTRAST:  153m OMNIPAQUE IOHEXOL 300 MG/ML  SOLN COMPARISON:  CT abdomen 12/26/2017.  CT chest 12/02/2017. FINDINGS: CT CHEST  FINDINGS Cardiovascular: Aortic atherosclerosis. Previous median sternotomy and CABG. Coronary artery calcification. Cardiomegaly. Pacemaker/AICD in place. Pericardial device on the left. Pericardial device anteriorly. Mediastinum/Nodes: Slight enlargement of AP window nodes compared to the previous exam, presumably reactive. Lungs/Pleura: Small effusions layering dependently. Dependent pulmonary atelectasis and or pneumonia, most pronounced in the left lower lobe. Non dependent lungs are clear. Musculoskeletal: Small fluid density area anterior to the xiphoid process measures about 2.5 cm in diameter and could possibly represent a superficial abscess. CT ABDOMEN PELVIS FINDINGS Hepatobiliary: Liver parenchyma appears normal. Calcified gallstones as seen previously. Pancreas: Atrophic change.  No focal finding. Spleen: Normal Adrenals/Urinary Tract: Adrenal glands are normal. Kidneys are normal except for a few benign cysts. Bladder is normal. Stomach/Bowel: No acute bowel finding. Vascular/Lymphatic: Aortic atherosclerosis. Branch vessel atherosclerosis. No aneurysm. No retroperitoneal adenopathy.  Reproductive: Normal Other: No free fluid or air. Musculoskeletal: Soft tissue swelling of the left anterior abdominal wall region including the rectus muscle and overlying sheath, surrounding leads, some of which have been removed since the study of February. The area of concern measures up to 2.8 cm in thickness and 8.4 cm right to left and extends over a length of about 8.3 cm. This may simply be postoperative sequela, but ongoing infection is not excluded. Ordinary degenerative changes affect the spine. IMPRESSION: Bilateral pleural effusions layering dependently with dependent atelectasis and or pneumonia, left lower lobe predominant. Enlargement of AP window lymph nodes, presumably reactive. Fluid collection anterior to the xiphoid process measuring up to 2.5 cm in diameter. This could be infected material. Fluid at the left anterior abdominal wall including around the previous anterior pericardial/epicardial leads, some of which have been removed. This area measures approximately 2.8 x 8.4 x 8.3 cm. This could simply be postoperative swelling and edema, but the possibility of ongoing or recurrent infection is not excluded. The appearance is better than on the study of 12/26/2017. Electronically Signed   By: Nelson Chimes M.D.   On: 04/02/2018 22:03   Ct Cervical Spine Wo Contrast  Result Date: 04/03/2018 CLINICAL DATA:  82 year old male fell 1 week ago hurting left shoulder. Bilateral neck pain. Denies injury to head or loss of consciousness. Initial encounter. EXAM: CT HEAD WITHOUT CONTRAST CT CERVICAL SPINE WITHOUT CONTRAST TECHNIQUE: Multidetector CT imaging of the head and cervical spine was performed following the standard protocol without intravenous contrast. Multiplanar CT image reconstructions of the cervical spine were also generated. COMPARISON:  07/16/2017 head CT.  10/23/2015 cervical spine CT. FINDINGS: CT HEAD FINDINGS Brain: No intracranial hemorrhage or CT evidence of large acute infarct.  Chronic microvascular changes. Mild global atrophy. Left parietal 5 mm dural-based calcification may represent small meningioma without associated vasogenic edema and unchanged. Vascular: Vascular calcifications Skull: No skull fracture Sinuses/Orbits: No acute orbital abnormality. Visualized paranasal sinuses are clear. Other: Mastoid air cells and middle ear cavities are clear. CT CERVICAL SPINE FINDINGS Alignment: Alignment similar to prior cervical spine CT. Minimal anterior slip C3 and C4. Skull base and vertebrae: No cervical spine fracture. Soft tissues and spinal canal: No abnormal prevertebral soft tissue swelling. Disc levels: Transverse ligament hypertrophy. Multilevel cervical spondylotic changes C3-4 through C6-7 have progressed slightly since prior exam. Upper chest: Bilateral pleural effusions layering posteriorly. Other: Prominent caries. Pacemaker in place. Prominent carotid bifurcation calcifications. IMPRESSION: CT HEAD: No skull fracture or intracranial hemorrhage. Chronic microvascular changes. Mild global atrophy. Left parietal 5 mm dural-based calcification may represent small meningioma without associated vasogenic edema and unchanged. CT CERVICAL SPINE: Alignment similar  to prior cervical spine CT. Minimal anterior slip C3 and C4. No cervical spine fracture or abnormal prevertebral soft tissue swelling. Multilevel cervical spondylotic changes C3-4 through C6-7 have progressed slightly since prior exam. Bilateral pleural effusions layering posteriorly. Prominent carotid bifurcation calcifications. Electronically Signed   By: Genia Del M.D.   On: 04/03/2018 10:12   Ct Abdomen Pelvis W Contrast  Result Date: 04/02/2018 CLINICAL DATA:  Recent surgery to remove infected wires from previous AICD. Also history of anal fistula repair. EXAM: CT CHEST, ABDOMEN, AND PELVIS WITH CONTRAST TECHNIQUE: Multidetector CT imaging of the chest, abdomen and pelvis was performed following the standard  protocol during bolus administration of intravenous contrast. CONTRAST:  146m OMNIPAQUE IOHEXOL 300 MG/ML  SOLN COMPARISON:  CT abdomen 12/26/2017.  CT chest 12/02/2017. FINDINGS: CT CHEST FINDINGS Cardiovascular: Aortic atherosclerosis. Previous median sternotomy and CABG. Coronary artery calcification. Cardiomegaly. Pacemaker/AICD in place. Pericardial device on the left. Pericardial device anteriorly. Mediastinum/Nodes: Slight enlargement of AP window nodes compared to the previous exam, presumably reactive. Lungs/Pleura: Small effusions layering dependently. Dependent pulmonary atelectasis and or pneumonia, most pronounced in the left lower lobe. Non dependent lungs are clear. Musculoskeletal: Small fluid density area anterior to the xiphoid process measures about 2.5 cm in diameter and could possibly represent a superficial abscess. CT ABDOMEN PELVIS FINDINGS Hepatobiliary: Liver parenchyma appears normal. Calcified gallstones as seen previously. Pancreas: Atrophic change.  No focal finding. Spleen: Normal Adrenals/Urinary Tract: Adrenal glands are normal. Kidneys are normal except for a few benign cysts. Bladder is normal. Stomach/Bowel: No acute bowel finding. Vascular/Lymphatic: Aortic atherosclerosis. Branch vessel atherosclerosis. No aneurysm. No retroperitoneal adenopathy. Reproductive: Normal Other: No free fluid or air. Musculoskeletal: Soft tissue swelling of the left anterior abdominal wall region including the rectus muscle and overlying sheath, surrounding leads, some of which have been removed since the study of February. The area of concern measures up to 2.8 cm in thickness and 8.4 cm right to left and extends over a length of about 8.3 cm. This may simply be postoperative sequela, but ongoing infection is not excluded. Ordinary degenerative changes affect the spine. IMPRESSION: Bilateral pleural effusions layering dependently with dependent atelectasis and or pneumonia, left lower lobe  predominant. Enlargement of AP window lymph nodes, presumably reactive. Fluid collection anterior to the xiphoid process measuring up to 2.5 cm in diameter. This could be infected material. Fluid at the left anterior abdominal wall including around the previous anterior pericardial/epicardial leads, some of which have been removed. This area measures approximately 2.8 x 8.4 x 8.3 cm. This could simply be postoperative swelling and edema, but the possibility of ongoing or recurrent infection is not excluded. The appearance is better than on the study of 12/26/2017. Electronically Signed   By: MNelson ChimesM.D.   On: 04/02/2018 22:03   Dg Chest Port 1 View  Result Date: 04/12/2018 CLINICAL DATA:  PICC line placement. EXAM: PORTABLE CHEST 1 VIEW COMPARISON:  04/05/2018. FINDINGS: Unchanged cardiomegaly with single lead AICD. Prior CABG. Retrocardiac density representing subsegmental atelectasis, with or without superimposed consolidation. No pneumothorax. Thoracic aorta atherosclerosis. RIGHT PICC line lies with its tip at the cavoatrial junction. IMPRESSION: PICC line tip cavoatrial junction. Retrocardiac density, likely subsegmental atelectasis. Electronically Signed   By: JStaci RighterM.D.   On: 04/12/2018 10:54   Dg Chest Port 1 View  Result Date: 04/05/2018 CLINICAL DATA:  Status post sternal and abdominal wound debridement. Shortness of breath, cough EXAM: PORTABLE CHEST 1 VIEW COMPARISON:  04/04/2018 FINDINGS: Prior CABG.  Left AICD remains in place, unchanged. Cardiomegaly. Low lung volumes without confluent opacity or effusion. No pneumothorax. IMPRESSION: Low lung volumes, cardiomegaly.  No active disease. Electronically Signed   By: Rolm Baptise M.D.   On: 04/05/2018 10:33   Dg Shoulder Left  Result Date: 04/03/2018 CLINICAL DATA:  Left shoulder pain after fall 2 weeks ago. EXAM: LEFT SHOULDER - 2+ VIEW COMPARISON:  Radiographs of February 17, 2006. FINDINGS: There is no evidence of fracture or  dislocation. Mild degenerative changes seen involving the left acromioclavicular joint. Soft tissues are unremarkable. IMPRESSION: Mild degenerative joint disease of left acromioclavicular joint. No acute abnormality seen in the left shoulder. Electronically Signed   By: Marijo Conception, M.D.   On: 04/03/2018 09:27   [place] Ekg Site Rite  Result Date: 04/11/2018 If Site Rite image not attached, placement could not be confirmed due to current cardiac rhythm.     LAB RESULTS: Basic Metabolic Panel: Recent Labs  Lab 04/11/18 0232 04/12/18 0246  NA 141 140  K 3.9 3.9  CL 100* 100*  CO2 32 33*  GLUCOSE 224* 198*  BUN 17 13  CREATININE 1.66* 1.29*  CALCIUM 8.5* 8.5*  PHOS 3.3 2.8   Liver Function Tests: Recent Labs  Lab 04/06/18 0331 04/07/18 0257  04/11/18 0232 04/12/18 0246  AST 21 20  --   --   --   ALT 8* <5*  --   --   --   ALKPHOS 114 141*  --   --   --   BILITOT 1.0 1.0  --   --   --   PROT 6.5 6.7  --   --   --   ALBUMIN 2.6* 2.7*   < > 2.5* 2.5*   < > = values in this interval not displayed.   No results for input(s): LIPASE, AMYLASE in the last 168 hours. No results for input(s): AMMONIA in the last 168 hours. CBC: Recent Labs  Lab 04/07/18 0257 04/10/18 0235  WBC 10.6* 10.7*  HGB 9.1* 9.3*  HCT 29.5* 31.6*  MCV 81.0 84.9  PLT 270 267   Cardiac Enzymes: No results for input(s): CKTOTAL, CKMB, CKMBINDEX, TROPONINI in the last 168 hours. BNP: Invalid input(s): POCBNP CBG: Recent Labs  Lab 04/12/18 0818 04/12/18 1226  GLUCAP 129* 135*      Disposition and Follow-up: Discharge Instructions    Diet Carb Modified   Complete by:  As directed    Home infusion instructions [facility] May follow [place] Dosing Protocol; May administer Cathflo as needed to maintain patency of vascular access device.; Flushing of vascular access device: per [facility] Protocol: 0.9% NaCl pre/post medica...   Complete by:  As directed    Instructions:  May follow [place] Dosing Protocol   Instructions:  May administer Cathflo as needed to maintain patency of vascular access device.   Instructions:  Flushing of vascular access device: per [facility] Protocol: 0.9% NaCl pre/post medication administration and prn patency; Heparin 100 u/ml, 36m for implanted ports and Heparin 10u/ml, 540mfor all other central venous catheters.   Instructions:  May follow [facility] Anaphylaxis Protocol for First Dose Administration in the home: 0.9% NaCl at 25-50 ml/hr to maintain IV access for protocol meds. Epinephrine 0.3 ml IV/IM PRN and Benadryl 25-50 IV/IM PRN s/s of anaphylaxis.   Instructions:  AdCross Roadsnfusion Coordinator (RN) to assist per patient IV care needs in the home PRN.   Home infusion instructions [facility] May follow [place] Dosing Protocol; May administer Cathflo as needed to maintain patency of vascular access device.; Flushing of vascular access device: per [facility] Protocol: 0.9% NaCl pre/post medica...   Complete by:  As directed    Instructions:  May follow [place] Dosing Protocol   Instructions:  May administer Cathflo as needed to maintain patency of vascular access device.   Instructions:  Flushing of vascular access device: per [facility] Protocol: 0.9% NaCl pre/post medication administration and prn patency; Heparin 100 u/ml, 68m for implanted ports and Heparin 10u/ml, 531mfor all other central venous catheters.   Instructions:  May follow [facility] Anaphylaxis Protocol for First Dose Administration in the home: 0.9% NaCl at 25-50 ml/hr to maintain IV access for protocol meds. Epinephrine 0.3 ml IV/IM PRN and Benadryl 25-50 IV/IM PRN s/s of anaphylaxis.   Instructions:  AdAndersonnfusion Coordinator (RN) to assist per patient IV care needs in the home PRN.   Increase activity slowly   Complete by:  As directed        DISPOSITION: [facility]  DISCHARGE FOLLOW-UP Follow-up Information    SnCarlyle BasquesMD Follow up on 05/30/2018.    Specialty:  Infectious Diseases Why:  10:30 am appointment  Contact information: [phone]        CoCyndi BenderPA-C. Schedule an appointment as soon as possible for a visit in 3 week(s).   Specialty:  Physician Assistant Contact information: [phone]        JoMartiniquePeter M, MD .   Specialty:  Cardiology Contact information: [phone]          Time coordinating discharge:  4586m   Signed:   RipEstill CottaD. [facility] 04/12/2018, 12:59 PM Pager: [phone] Addendum Patient was unable to be discharged on 5/23, [facility] RipEstill CottaD. [facility] 03/30/2018, 11:21 AM  Pager: [phone]

## 2018-04-13 ENCOUNTER — Inpatient Hospital Stay
Admission: RE | Admit: 2018-04-13 | Discharge: 2018-05-21 | Disposition: E | Payer: Self-pay | Source: Ambulatory Visit | Attending: Internal Medicine | Admitting: Internal Medicine

## 2018-04-13 DIAGNOSIS — R7881 Bacteremia: Secondary | ICD-10-CM | POA: Diagnosis not present

## 2018-04-13 DIAGNOSIS — A419 Sepsis, unspecified organism: Secondary | ICD-10-CM | POA: Diagnosis present

## 2018-04-13 DIAGNOSIS — Z95810 Presence of automatic (implantable) cardiac defibrillator: Secondary | ICD-10-CM | POA: Diagnosis not present

## 2018-04-13 DIAGNOSIS — E1122 Type 2 diabetes mellitus with diabetic chronic kidney disease: Secondary | ICD-10-CM | POA: Diagnosis present

## 2018-04-13 DIAGNOSIS — E46 Unspecified protein-calorie malnutrition: Secondary | ICD-10-CM | POA: Diagnosis not present

## 2018-04-13 DIAGNOSIS — K219 Gastro-esophageal reflux disease without esophagitis: Secondary | ICD-10-CM | POA: Diagnosis present

## 2018-04-13 DIAGNOSIS — I130 Hypertensive heart and chronic kidney disease with heart failure and stage 1 through stage 4 chronic kidney disease, or unspecified chronic kidney disease: Secondary | ICD-10-CM | POA: Diagnosis present

## 2018-04-13 DIAGNOSIS — Z0189 Encounter for other specified special examinations: Secondary | ICD-10-CM

## 2018-04-13 DIAGNOSIS — E43 Unspecified severe protein-calorie malnutrition: Secondary | ICD-10-CM | POA: Diagnosis present

## 2018-04-13 DIAGNOSIS — Z66 Do not resuscitate: Secondary | ICD-10-CM | POA: Diagnosis present

## 2018-04-13 DIAGNOSIS — K56609 Unspecified intestinal obstruction, unspecified as to partial versus complete obstruction: Secondary | ICD-10-CM

## 2018-04-13 DIAGNOSIS — K8020 Calculus of gallbladder without cholecystitis without obstruction: Secondary | ICD-10-CM | POA: Diagnosis not present

## 2018-04-13 DIAGNOSIS — N179 Acute kidney failure, unspecified: Secondary | ICD-10-CM | POA: Diagnosis present

## 2018-04-13 DIAGNOSIS — N183 Chronic kidney disease, stage 3 (moderate): Secondary | ICD-10-CM | POA: Diagnosis present

## 2018-04-13 DIAGNOSIS — F418 Other specified anxiety disorders: Secondary | ICD-10-CM | POA: Diagnosis not present

## 2018-04-13 DIAGNOSIS — J15212 Pneumonia due to Methicillin resistant Staphylococcus aureus: Secondary | ICD-10-CM | POA: Diagnosis not present

## 2018-04-13 DIAGNOSIS — Z794 Long term (current) use of insulin: Secondary | ICD-10-CM | POA: Diagnosis not present

## 2018-04-13 DIAGNOSIS — R112 Nausea with vomiting, unspecified: Secondary | ICD-10-CM

## 2018-04-13 DIAGNOSIS — R188 Other ascites: Secondary | ICD-10-CM | POA: Diagnosis not present

## 2018-04-13 DIAGNOSIS — I5043 Acute on chronic combined systolic (congestive) and diastolic (congestive) heart failure: Secondary | ICD-10-CM | POA: Diagnosis present

## 2018-04-13 DIAGNOSIS — Z951 Presence of aortocoronary bypass graft: Secondary | ICD-10-CM | POA: Diagnosis not present

## 2018-04-13 DIAGNOSIS — L02213 Cutaneous abscess of chest wall: Secondary | ICD-10-CM | POA: Diagnosis not present

## 2018-04-13 DIAGNOSIS — J9610 Chronic respiratory failure, unspecified whether with hypoxia or hypercapnia: Secondary | ICD-10-CM

## 2018-04-13 DIAGNOSIS — F331 Major depressive disorder, recurrent, moderate: Secondary | ICD-10-CM | POA: Diagnosis not present

## 2018-04-13 DIAGNOSIS — F4321 Adjustment disorder with depressed mood: Secondary | ICD-10-CM

## 2018-04-13 DIAGNOSIS — M7981 Nontraumatic hematoma of soft tissue: Secondary | ICD-10-CM | POA: Diagnosis present

## 2018-04-13 DIAGNOSIS — I2510 Atherosclerotic heart disease of native coronary artery without angina pectoris: Secondary | ICD-10-CM | POA: Diagnosis present

## 2018-04-13 DIAGNOSIS — L02211 Cutaneous abscess of abdominal wall: Secondary | ICD-10-CM | POA: Diagnosis not present

## 2018-04-13 DIAGNOSIS — I482 Chronic atrial fibrillation: Secondary | ICD-10-CM | POA: Diagnosis not present

## 2018-04-13 DIAGNOSIS — J449 Chronic obstructive pulmonary disease, unspecified: Secondary | ICD-10-CM | POA: Diagnosis present

## 2018-04-13 DIAGNOSIS — Z8673 Personal history of transient ischemic attack (TIA), and cerebral infarction without residual deficits: Secondary | ICD-10-CM | POA: Diagnosis not present

## 2018-04-13 DIAGNOSIS — S21109D Unspecified open wound of unspecified front wall of thorax without penetration into thoracic cavity, subsequent encounter: Secondary | ICD-10-CM | POA: Diagnosis not present

## 2018-04-13 DIAGNOSIS — T8149XA Infection following a procedure, other surgical site, initial encounter: Secondary | ICD-10-CM | POA: Diagnosis present

## 2018-04-13 DIAGNOSIS — Z87891 Personal history of nicotine dependence: Secondary | ICD-10-CM | POA: Diagnosis not present

## 2018-04-13 DIAGNOSIS — A4901 Methicillin susceptible Staphylococcus aureus infection, unspecified site: Secondary | ICD-10-CM | POA: Diagnosis not present

## 2018-04-13 DIAGNOSIS — I4891 Unspecified atrial fibrillation: Secondary | ICD-10-CM | POA: Diagnosis present

## 2018-04-13 DIAGNOSIS — Z4659 Encounter for fitting and adjustment of other gastrointestinal appliance and device: Secondary | ICD-10-CM

## 2018-04-13 DIAGNOSIS — M6281 Muscle weakness (generalized): Secondary | ICD-10-CM | POA: Diagnosis present

## 2018-04-13 DIAGNOSIS — Z4682 Encounter for fitting and adjustment of non-vascular catheter: Secondary | ICD-10-CM | POA: Diagnosis not present

## 2018-04-13 DIAGNOSIS — I509 Heart failure, unspecified: Secondary | ICD-10-CM | POA: Diagnosis not present

## 2018-04-13 DIAGNOSIS — E785 Hyperlipidemia, unspecified: Secondary | ICD-10-CM | POA: Diagnosis present

## 2018-04-13 DIAGNOSIS — E1151 Type 2 diabetes mellitus with diabetic peripheral angiopathy without gangrene: Secondary | ICD-10-CM | POA: Diagnosis present

## 2018-04-13 DIAGNOSIS — T8140XA Infection following a procedure, unspecified, initial encounter: Secondary | ICD-10-CM | POA: Diagnosis not present

## 2018-04-13 DIAGNOSIS — K5900 Constipation, unspecified: Secondary | ICD-10-CM

## 2018-04-13 DIAGNOSIS — R4020 Unspecified coma: Secondary | ICD-10-CM | POA: Diagnosis not present

## 2018-04-13 DIAGNOSIS — B9561 Methicillin susceptible Staphylococcus aureus infection as the cause of diseases classified elsewhere: Secondary | ICD-10-CM | POA: Diagnosis present

## 2018-04-13 DIAGNOSIS — Z431 Encounter for attention to gastrostomy: Secondary | ICD-10-CM

## 2018-04-13 DIAGNOSIS — R1310 Dysphagia, unspecified: Secondary | ICD-10-CM | POA: Diagnosis present

## 2018-04-13 DIAGNOSIS — J9690 Respiratory failure, unspecified, unspecified whether with hypoxia or hypercapnia: Secondary | ICD-10-CM | POA: Diagnosis not present

## 2018-04-13 LAB — GLUCOSE, CAPILLARY
GLUCOSE-CAPILLARY: 165 mg/dL — AB (ref 65–99)
Glucose-Capillary: 193 mg/dL — ABNORMAL HIGH (ref 65–99)

## 2018-04-13 LAB — BPAM RBC
BLOOD PRODUCT EXPIRATION DATE: 201906112359
BLOOD PRODUCT EXPIRATION DATE: 201906112359
ISSUE DATE / TIME: 201905131550
ISSUE DATE / TIME: 201905131937
UNIT TYPE AND RH: 9500
Unit Type and Rh: 9500

## 2018-04-13 LAB — TYPE AND SCREEN
ABO/RH(D): O NEG
Antibody Screen: POSITIVE
DAT, IgG: NEGATIVE
DONOR AG TYPE: NEGATIVE
Donor AG Type: NEGATIVE
UNIT DIVISION: 0
Unit division: 0

## 2018-04-13 LAB — RENAL FUNCTION PANEL
Albumin: 2.4 g/dL — ABNORMAL LOW (ref 3.5–5.0)
Anion gap: 7 (ref 5–15)
BUN: 8 mg/dL (ref 6–20)
CHLORIDE: 100 mmol/L — AB (ref 101–111)
CO2: 32 mmol/L (ref 22–32)
Calcium: 8.5 mg/dL — ABNORMAL LOW (ref 8.9–10.3)
Creatinine, Ser: 1.07 mg/dL (ref 0.61–1.24)
GFR calc Af Amer: >60 mL/min (ref >60)
Glucose, Bld: 176 mg/dL — ABNORMAL HIGH (ref 65–99)
POTASSIUM: 4.1 mmol/L (ref 3.5–5.1)
Phosphorus: 3.2 mg/dL (ref 2.5–4.6)
Sodium: 139 mmol/L (ref 135–145)

## 2018-04-13 LAB — HEMOGLOBIN A1C
HEMOGLOBIN A1C: 6.4 % — AB (ref 4.8–5.6)
MEAN PLASMA GLUCOSE: 136.98 mg/dL

## 2018-04-13 MED ORDER — AMLODIPINE BESYLATE 5 MG PO TABS
5.0000 mg | ORAL_TABLET | Freq: Every day | ORAL | Status: DC
  Administered 2018-04-13: 5 mg via ORAL
  Filled 2018-04-13: qty 1

## 2018-04-13 MED ORDER — APIXABAN 2.5 MG PO TABS: 5.0000 mg | ORAL_TABLET | Freq: Two times a day (BID) | ORAL | Status: DC

## 2018-04-13 MED ORDER — APIXABAN 2.5 MG PO TABS: 2.5000 mg | ORAL_TABLET | Freq: Two times a day (BID) | ORAL | Status: AC

## 2018-04-13 MED ORDER — AMLODIPINE BESYLATE 5 MG PO TABS: 5.0000 mg | ORAL_TABLET | Freq: Every day | ORAL | Status: AC

## 2018-04-13 NOTE — Progress Notes (Signed)
Pt not discharged to [facility] 5/23, as bed did not become available until late into the night.  Rather than transfer pt in the middle of the night, decision was made to transfer during the day today.  Bed available today at [facility] [place] [place].  Notified bedside nurse, Shirlean Mylar to call report to [phone], and check status of room.  Nurse to transfer when room ready.  Confirmed all discharge arrangements with Barbette Reichmann, admissions coordinator for [facility].    Reinaldo Raddle, RN, BSN  Trauma/Neuro ICU Case Manager [phone]

## 2018-04-13 NOTE — Progress Notes (Addendum)
      [address]       [place] [address]             [phone]      3 Days Post-Op Procedure(s) (LRB): WOUND VAC CHANGE (N/A)   Subjective:  No new complaints.  Continues to have pain at wound site.  Objective: Vital signs in last 24 hours: Temp:  [97.4 F (36.3 C)-97.7 F (36.5 C)] 97.7 F (36.5 C) (05/24 0736) Pulse Rate:  [60-74] 68 (05/24 0736) Cardiac Rhythm: Normal sinus rhythm;Heart block;Bundle branch block (05/24 0330) Resp:  [15-23] 20 (05/24 0736) BP: (148-165)/(61-89) 154/64 (05/24 0736) SpO2:  [95 %-100 %] 100 % (05/24 0736) Weight:  [134 lb 7.7 oz (61 kg)] 134 lb 7.7 oz (61 kg) (05/24 0330)  Intake/Output from previous day: 05/23 0701 - 05/24 0700 In: 880 [P.O.:880] Out: 301 [Urine:300; Stool:1]  General appearance: alert, cooperative and no distress Heart: regular rate and rhythm Lungs: clear to auscultation bilaterally Abdomen: soft, non-tender; bowel sounds normal; no masses,  no organomegaly Extremities: extremities normal, atraumatic, no cyanosis or edema Wound: abdominal wound is clean and dry, sternal wound has purulence noted, minimal erythema  Lab Results: No results for input(s): WBC, HGB, HCT, PLT in the last 72 hours. BMET:  Recent Labs    04/12/18 0246 04/09/2018 0500  NA 140 139  K 3.9 4.1  CL 100* 100*  CO2 33* 32  GLUCOSE 198* 176*  BUN 13 8  CREATININE 1.29* 1.07  CALCIUM 8.5* 8.5*    PT/INR: No results for input(s): LABPROT, INR in the last 72 hours. ABG    Component Value Date/Time   PHART 7.420 04/06/2018 0415   HCO3 31.4 (H) 04/06/2018 0415   TCO2 32 03/20/2015 1638   O2SAT 97.9 04/06/2018 0415   CBG (last 3)  Recent Labs    04/12/18 1731 04/12/18 2116 04/12/2018 0735  GLUCAP 71 146* 165*    Assessment/Plan: S/P Procedure(s) (LRB): WOUND VAC CHANGE (N/A)  1. CV- NSR with 1st degree heart block, + HTN- would benefit from antihypertensive agent 2. Pulm- no acute issues, continue IS 3.  Renal- creatinine WNL 4. ID- continue wet to dry dressings, sternal wound may need further debridement 5. Dispo-care per primary, can restart home Apixiban 2.5 mg at discharge, continue daily wet to dry dressing changes  LOS: 11 days    Ellwood Handler 03/28/2018

## 2018-04-13 NOTE — Progress Notes (Signed)
Notified pt's grandson, [name], that pt will be moving to [facility] today and will be in room 5.

## 2018-04-13 NOTE — Progress Notes (Signed)
Called report to [employer] at [facility]	[place]. Will leave piv per RN request. Pt will be transferred in wheelchair on 2L.

## 2018-04-13 NOTE — Progress Notes (Signed)
Pharmacy Antibiotic Note  Victor [name] is a 82 y.o. male admitted on 04/02/2018 with wound infection. 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  Admitted for sternal and abdominal wound infection with MSSA - wound VAC applied 5/21.  Tm 98 WBC 10 wnl, Cr improved s/p AKI Cr peaked at 4 now back to BL 1.2.    Pharmacy consulted to dose ancef for MSSA Per ID plans for 6 weeks antibiotics  Rifampin stopped d/t N/V   Plan: Increased Ancef 2gm IV q8h (end 06/02/18) Monitor clinical progress, c/s, renal function   Height: 5\' 7"  (170.2 cm) Weight: 134 lb 7.7 oz (61 kg) IBW/kg (Calculated) : 66.1  Temp (24hrs), Avg:97.6 F (36.4 C), Min:97.4 F (36.3 C), Max:97.7 F (36.5 C)  Recent Labs  Lab 04/07/18 0257 04/08/18 0314 04/09/18 0313 04/10/18 0235 04/11/18 0232 04/12/18 0246  WBC 10.6*  --   --  10.7*  --   --   CREATININE 4.38* 3.82* 2.89* 1.92* 1.66* 1.29*    Estimated Creatinine Clearance: 39.4 mL/min (A) (by C-G formula based on SCr of 1.29 mg/dL (H)).    Allergies  Allergen Reactions  . Coreg [Carvedilol] Other (See Comments)    Shortness of breath ,fatigue ,dizzyness   . Lovastatin Other (See Comments)    CK elevation, Myalgias  . Ativan [Lorazepam] Other (See Comments)    Pt. had side effects   Bonnita Nasuti Pharm.D. CPP, BCPS Clinical Pharmacist [phone] 04/03/2018 7:54 AM

## 2018-04-13 NOTE — Discharge Summary (Signed)
Physician Discharge Summary   Patient ID: Victor [name] MRN: [id number] DOB/AGE: January 20, 1936 82 y.o.  Admit date: 04/02/2018 Discharge date: 04/02/2018  Primary Care Physician:  Cyndi Bender, PA-C   Recommendations for Outpatient Follow-up:  1. Continue wet-to-dry dressing changes 2. Continue cefazolin IV, duration 6 weeks, end date July 13th 3.  please pull PICC line at the completion of IV antibiotics 4. CBC with differential, BMET weekly while on antibiotics, fax weekly labs to [phone] 5. Palliative care to follow at the rehab  Home Health: None Equipment/Devices: None  Discharge Condition: stable  CODE STATUS:  DNR   Diet recommendation: Carb modified, renal with fluid restriction 1200 cc   Discharge Diagnoses:   Marland Kitchen MSSA (methicillin susceptible Staphylococcus aureus) infection . COPD (chronic obstructive pulmonary disease) ([place]) . Infected left upper quadrant rectus sheath hematoma at the site of primary ICD, stat Abdominal wall abscess . Severe protein calorie malnutrition . Type II diabetes mellitus with renal manifestations ([place]) . Automatic implantable cardioverter-defibrillator in situ . Essential hypertension . GERD (gastroesophageal reflux disease) . Stage 3 chronic kidney disease ([place]) . Atrial fibrillation, chronic ([place]) . HLD (hyperlipidemia) . Stroke ([place]) . CAD (coronary artery disease) . Fall . Acute on chronic systolic CHF (congestive heart failure) ([place])    Consults:  [facility] Infectious disease Nephrology    Allergies:   Allergies  Allergen Reactions  . Coreg [Carvedilol] Other (See Comments)    Shortness of breath ,fatigue ,dizzyness   . Lovastatin Other (See Comments)    CK elevation, Myalgias  . Ativan [Lorazepam] Other (See Comments)    Pt. had side effects     DISCHARGE MEDICATIONS: Allergies as of 04/03/2018      Reactions   Coreg [carvedilol] Other (See Comments)   Shortness of breath ,fatigue ,dizzyness    Lovastatin  Other (See Comments)   CK elevation, Myalgias   Ativan [lorazepam] Other (See Comments)   Pt. had side effects      Medication List    STOP taking these medications   doxycycline 100 MG tablet Commonly known as:  VIBRA-TABS   furosemide 40 MG tablet Commonly known as:  LASIX   lidocaine 5 % Commonly known as:  LIDODERM   linagliptin 5 MG Tabs tablet Commonly known as:  TRADJENTA   traMADol 50 MG tablet Commonly known as:  ULTRAM   VITAMIN B-12 PO     TAKE these medications   acetaminophen 325 MG tablet Commonly known as:  TYLENOL Take 2 tablets (650 mg total) by mouth every 4 (four) hours as needed for headache or mild pain.   albuterol (2.5 MG/3ML) 0.083% nebulizer solution Commonly known as:  PROVENTIL Take 3 mLs (2.5 mg total) by nebulization every 6 (six) hours as needed for wheezing or shortness of breath.   amiodarone 200 MG tablet Commonly known as:  PACERONE Take 1 tablet (200 mg total) by mouth daily.   amLODipine 5 MG tablet Commonly known as:  NORVASC Take 1 tablet (5 mg total) by mouth daily.   apixaban 2.5 MG Tabs tablet Commonly known as:  ELIQUIS Take 1 tablet (2.5 mg total) by mouth 2 (two) times daily. What changed:    medication strength  how much to take   bisoprolol 5 MG tablet Commonly known as:  ZEBETA Take 0.5 tablets (2.5 mg total) by mouth daily.   CALCIUM PO Take 1 tablet by mouth at bedtime.   ceFAZolin IVPB Commonly known as:  ANCEF Inject 2 g into the vein every  8 (eight) hours for 69 doses. Indication:  MSSA abscess / AICD retained wires  Last Day of Therapy:  06/02/2018 Labs - Once weekly:  CBC/D and BMP, Labs - Every other week:  ESR and CRP   docusate sodium 100 MG capsule Commonly known as:  COLACE Take 1 capsule (100 mg total) by mouth at bedtime.   DULoxetine 30 MG capsule Commonly known as:  CYMBALTA Take 30 mg by mouth daily.   feeding supplement (ENSURE ENLIVE) Liqd Take 237 mLs by mouth 3 (three) times  daily between meals.   ferrous sulfate 325 (65 FE) MG tablet Take 1 tablet (325 mg total) by mouth 2 (two) times daily with a meal.   guaiFENesin-dextromethorphan 100-10 MG/5ML syrup Commonly known as:  ROBITUSSIN DM Take 5 mLs by mouth every 4 (four) hours as needed for cough.   HYDROcodone-acetaminophen 5-325 MG tablet Commonly known as:  NORCO/VICODIN Take 1 tablet by mouth every 6 (six) hours as needed for moderate pain or severe pain.   insulin aspart 100 UNIT/ML injection Commonly known as:  novoLOG Inject 0-9 Units into the skin 3 (three) times daily with meals.   insulin detemir 100 UNIT/ML injection Commonly known as:  LEVEMIR Inject 10 Units into the skin See admin instructions. 10 units in the morning. May increase as needed to keep fasting BS < 130.   methocarbamol 750 MG tablet Commonly known as:  ROBAXIN Take 1 tablet (750 mg total) by mouth every 8 (eight) hours as needed for muscle spasms.   mirtazapine 7.5 MG tablet Commonly known as:  REMERON Take 1 tablet (7.5 mg total) by mouth at bedtime.   nitroGLYCERIN 0.4 MG SL tablet Commonly known as:  NITROSTAT Place 1 tablet (0.4 mg total) under the tongue every 5 (five) minutes as needed for chest pain.   omeprazole 20 MG capsule Commonly known as:  PRILOSEC Take 20 mg by mouth every morning.   polyethylene glycol packet Commonly known as:  MIRALAX / GLYCOLAX Take 17 g by mouth daily.   pravastatin 40 MG tablet Commonly known as:  PRAVACHOL Take 1 tablet (40 mg total) by mouth daily at 6 PM.   promethazine 12.5 MG tablet Commonly known as:  PHENERGAN Take 1 tablet (12.5 mg total) by mouth every 6 (six) hours as needed for nausea or vomiting.   senna-docusate 8.6-50 MG tablet Commonly known as:  Senokot-S Take 1 tablet by mouth 2 (two) times daily.   URELLE 81 MG Tabs tablet Take 1 tablet (81 mg total) by mouth 3 (three) times daily as needed for bladder spasms.            Home Infusion  Instuctions  (From admission, onward)        Start     Ordered   04/12/18 0000  Home infusion instructions [facility] May follow [place] Dosing Protocol; May administer Cathflo as needed to maintain patency of vascular access device.; [place] of vascular access device: per [facility] Protocol: 0.9% NaCl pre/post medica...    Question Answer Comment  Instructions May follow [place] Dosing Protocol   Instructions May administer Cathflo as needed to maintain patency of vascular access device.   Instructions [place]ushing of vascular access device: per [facility] Protocol: 0.9% NaCl pre/post medication administration and prn patency; Heparin 100 u/ml, 18m for implanted ports and Heparin 10u/ml, 574mfor all other central venous catheters.   Instructions May follow [facility] Anaphylaxis Protocol for First Dose Administration in the home: 0.9% NaCl at  25-50 ml/hr to maintain IV access for protocol meds. Epinephrine 0.3 ml IV/IM PRN and Benadryl 25-50 IV/IM PRN s/s of anaphylaxis.   Instructions [facility] Infusion Coordinator (RN) to assist per patient IV care needs in the home PRN.      04/12/18 0748   04/12/18 0000  Home infusion instructions [facility] May follow [place] Dosing Protocol; May administer Cathflo as needed to maintain patency of vascular access device.; [place] of vascular access device: per [facility] Protocol: 0.9% NaCl pre/post medica...    Question Answer Comment  Instructions May follow [place] Dosing Protocol   Instructions May administer Cathflo as needed to maintain patency of vascular access device.   Instructions [place] of vascular access device: per [facility] Protocol: 0.9% NaCl pre/post medication administration and prn patency; Heparin 100 u/ml, 79m for implanted ports and Heparin 10u/ml, 563mfor all other central venous catheters.   Instructions May follow [facility] Anaphylaxis Protocol for First Dose Administration in the home: 0.9% NaCl at 25-50 ml/hr to maintain IV  access for protocol meds. Epinephrine 0.3 ml IV/IM PRN and Benadryl 25-50 IV/IM PRN s/s of anaphylaxis.   Instructions [facility] Infusion Coordinator (RN) to assist per patient IV care needs in the home PRN.      04/12/18 1124       Brief H and P: For complete details please refer to admission H and P, but in brief[age]ear old Caucasian male with a past medical history of COPD on home oxygen, history of coronary artery disease status post CABG, chronic systolic CHF with a EF of 30% with ICD, hypertension, stroke presented with cellulitis over the sternal area. He was seen by cardiothoracic surgery. He also developed acute renal failure. He was also seen by nephrology.    Hospital Course:    MSSA  infection, sternal and abdominal wall abscess/infected left upper quadrant rectus sheath hematoma at the site of primary ICD -Status post I&D, lavage therapy, vacuum-assisted closure device change of lower sternal and upper abdominal wounds on 5/21 -Cardiothoracic surgery was consulted, continue wet-to-dry dressing changes -Infectious disease following, recommended continue cefazolin for 6-weeks, PICC line placed.  Rifampin was discontinued by [facility] due to intractable nausea. -Patient will continue cefazolin for 6 weeks, stop date June 02, 2018   Acute on chronic kidney disease stage III -Creatinine worsened likely due to contrast nephropathy versus hemodynamic instability. - Nephrology was consulted, hydrated, creatinine now improving baseline creatinine 1.2 - foley catheter was discontinued, creatinine peaked at 4.46 -Creatinine has improved to 1.0 at the time of discharge     Type II diabetes mellitus with renal manifestations ([place]-CBGs reasonably well controlled, continue sliding scale insulin, Levemir -HbA1c 8.6  Acute on Chronic systolic and diastolic CHF (congestive heart failure) ([facility]) -EF 30 to 35%, per 2D echo 2/19 - Continue beta-blocker, not on ACE inhibitor or  ARB due to AKI, creatinine improving, may restart once back to baseline  COPD on home O2, chronic respiratory failure -Currently stable, no wheezing  Permanent atrial fibrillation -Rate controlled, continue amiodarone, bisoprolol  - CHADS VASC 2, was on Eliquis, was held due to renal failure -  creatinine now back to baseline, [facility] recommended restarting Eliquis 2.5 mg twice a day    Protein-calorie malnutrition, severe Nutrition was consulted     Day of Discharge S: Was unable to be discharged yesterday, no acute issues overnight.  No fevers or chills, denies any specific complaints.  BP (!) 152/71 (BP Location: Left Arm)  Pulse 68   Temp 97.7 F (36.5 C) (Oral)   Resp 20   Ht _0  (1.702 m)   Wt 61 kg (134 lb 7.7 oz)   SpO2 99%   BMI 21.06 kg/m   Physical Exam: General: Alert and awake oriented x3 not in any acute distress. HEENT: anicteric sclera, pupils reactive to light and accommodation CVS: S1-S2 clear no murmur rubs or gallops Chest: clear to auscultation bilaterally, no wheezing rales or rhonchi Abdomen: Soft, abdominal wound clean, dressing intact, sternal wound dressing noted  Extremities: no cyanosis, clubbing or edema noted bilaterally Neuro: Cranial nerves II-XII intact, no focal neurological deficits   The results of significant diagnostics from this hospitalization (including imaging, microbiology, ancillary and laboratory) are listed below for reference.      Procedures/Studies:  Dg Chest 2 View  Result Date: 04/04/2018 CLINICAL DATA:  Pre op chest exam. Pt states he has an infection at the site of his previous pacemaker. He has a new pacemaker at his left shoulder. EXAM: CHEST - 2 VIEW COMPARISON:  Chest x-ray dated 12/28/2017. FINDINGS: Stable cardiomegaly. No confluent opacity to suggest a developing pneumonia. No pleural effusion or pneumothorax seen. No acute or suspicious osseous finding. LEFT chest wall pacemaker/ICD apparatus appears  stable in position with intact lead. IMPRESSION: No active cardiopulmonary disease. No evidence of pneumonia or pulmonary edema. Stable cardiomegaly. Electronically Signed   By: Franki Cabot M.D.   On: 04/04/2018 22:36   Ct Head Wo Contrast  Result Date: 04/03/2018 CLINICAL DATA:  82 year old male fell 1 week ago hurting left shoulder. Bilateral neck pain. Denies injury to head or loss of consciousness. Initial encounter. EXAM: CT HEAD WITHOUT CONTRAST CT CERVICAL SPINE WITHOUT CONTRAST TECHNIQUE: Multidetector CT imaging of the head and cervical spine was performed following the standard protocol without intravenous contrast. Multiplanar CT image reconstructions of the cervical spine were also generated. COMPARISON:  07/16/2017 head CT.  10/23/2015 cervical spine CT. FINDINGS: CT HEAD FINDINGS Brain: No intracranial hemorrhage or CT evidence of large acute infarct. Chronic microvascular changes. Mild global atrophy. Left parietal 5 mm dural-based calcification may represent small meningioma without associated vasogenic edema and unchanged. Vascular: Vascular calcifications Skull: No skull fracture Sinuses/Orbits: No acute orbital abnormality. Visualized paranasal sinuses are clear. Other: Mastoid air cells and middle ear cavities are clear. CT CERVICAL SPINE FINDINGS Alignment: Alignment similar to prior cervical spine CT. Minimal anterior slip C3 and C4. Skull base and vertebrae: No cervical spine fracture. Soft tissues and spinal canal: No abnormal prevertebral soft tissue swelling. Disc levels: Transverse ligament hypertrophy. Multilevel cervical spondylotic changes C3-4 through C6-7 have progressed slightly since prior exam. Upper chest: Bilateral pleural effusions layering posteriorly. Other: Prominent caries. Pacemaker in place. Prominent carotid bifurcation calcifications. IMPRESSION: CT HEAD: No skull fracture or intracranial hemorrhage. Chronic microvascular changes. Mild global atrophy. Left parietal  5 mm dural-based calcification may represent small meningioma without associated vasogenic edema and unchanged. CT CERVICAL SPINE: Alignment similar to prior cervical spine CT. Minimal anterior slip C3 and C4. No cervical spine fracture or abnormal prevertebral soft tissue swelling. Multilevel cervical spondylotic changes C3-4 through C6-7 have progressed slightly since prior exam. Bilateral pleural effusions layering posteriorly. Prominent carotid bifurcation calcifications. Electronically Signed   By: Genia Del M.D.   On: 04/03/2018 10:12   Ct Chest W Contrast  Result Date: 04/02/2018 CLINICAL DATA:  Recent surgery to remove infected wires from previous AICD. Also history of anal fistula repair. EXAM: CT CHEST, ABDOMEN, AND PELVIS WITH  CONTRAST TECHNIQUE: Multidetector CT imaging of the chest, abdomen and pelvis was performed following the standard protocol during bolus administration of intravenous contrast. CONTRAST:  122m OMNIPAQUE IOHEXOL 300 MG/ML  SOLN COMPARISON:  CT abdomen 12/26/2017.  CT chest 12/02/2017. FINDINGS: CT CHEST FINDINGS Cardiovascular: Aortic atherosclerosis. Previous median sternotomy and CABG. Coronary artery calcification. Cardiomegaly. Pacemaker/AICD in place. Pericardial device on the left. Pericardial device anteriorly. Mediastinum/Nodes: Slight enlargement of AP window nodes compared to the previous exam, presumably reactive. Lungs/Pleura: Small effusions layering dependently. Dependent pulmonary atelectasis and or pneumonia, most pronounced in the left lower lobe. Non dependent lungs are clear. Musculoskeletal: Small fluid density area anterior to the xiphoid process measures about 2.5 cm in diameter and could possibly represent a superficial abscess. CT ABDOMEN PELVIS FINDINGS Hepatobiliary: Liver parenchyma appears normal. Calcified gallstones as seen previously. Pancreas: Atrophic change.  No focal finding. Spleen: Normal Adrenals/Urinary Tract: Adrenal glands are normal.  Kidneys are normal except for a few benign cysts. Bladder is normal. Stomach/Bowel: No acute bowel finding. Vascular/Lymphatic: Aortic atherosclerosis. Branch vessel atherosclerosis. No aneurysm. No retroperitoneal adenopathy. Reproductive: Normal Other: No free fluid or air. Musculoskeletal: Soft tissue swelling of the left anterior abdominal wall region including the rectus muscle and overlying sheath, surrounding leads, some of which have been removed since the study of February. The area of concern measures up to 2.8 cm in thickness and 8.4 cm right to left and extends over a length of about 8.3 cm. This may simply be postoperative sequela, but ongoing infection is not excluded. Ordinary degenerative changes affect the spine. IMPRESSION: Bilateral pleural effusions layering dependently with dependent atelectasis and or pneumonia, left lower lobe predominant. Enlargement of AP window lymph nodes, presumably reactive. Fluid collection anterior to the xiphoid process measuring up to 2.5 cm in diameter. This could be infected material. Fluid at the left anterior abdominal wall including around the previous anterior pericardial/epicardial leads, some of which have been removed. This area measures approximately 2.8 x 8.4 x 8.3 cm. This could simply be postoperative swelling and edema, but the possibility of ongoing or recurrent infection is not excluded. The appearance is better than on the study of 12/26/2017. Electronically Signed   By: MNelson ChimesM.D.   On: 04/02/2018 22:03   Ct Cervical Spine Wo Contrast  Result Date: 04/03/2018 CLINICAL DATA:  82year old male fell 1 week ago hurting left shoulder. Bilateral neck pain. Denies injury to head or loss of consciousness. Initial encounter. EXAM: CT HEAD WITHOUT CONTRAST CT CERVICAL SPINE WITHOUT CONTRAST TECHNIQUE: Multidetector CT imaging of the head and cervical spine was performed following the standard protocol without intravenous contrast. Multiplanar CT  image reconstructions of the cervical spine were also generated. COMPARISON:  07/16/2017 head CT.  10/23/2015 cervical spine CT. FINDINGS: CT HEAD FINDINGS Brain: No intracranial hemorrhage or CT evidence of large acute infarct. Chronic microvascular changes. Mild global atrophy. Left parietal 5 mm dural-based calcification may represent small meningioma without associated vasogenic edema and unchanged. Vascular: Vascular calcifications Skull: No skull fracture Sinuses/Orbits: No acute orbital abnormality. Visualized paranasal sinuses are clear. Other: Mastoid air cells and middle ear cavities are clear. CT CERVICAL SPINE FINDINGS Alignment: Alignment similar to prior cervical spine CT. Minimal anterior slip C3 and C4. Skull base and vertebrae: No cervical spine fracture. Soft tissues and spinal canal: No abnormal prevertebral soft tissue swelling. Disc levels: Transverse ligament hypertrophy. Multilevel cervical spondylotic changes C3-4 through C6-7 have progressed slightly since prior exam. Upper chest: Bilateral pleural effusions layering posteriorly. Other: Prominent  caries. Pacemaker in place. Prominent carotid bifurcation calcifications. IMPRESSION: CT HEAD: No skull fracture or intracranial hemorrhage. Chronic microvascular changes. Mild global atrophy. Left parietal 5 mm dural-based calcification may represent small meningioma without associated vasogenic edema and unchanged. CT CERVICAL SPINE: Alignment similar to prior cervical spine CT. Minimal anterior slip C3 and C4. No cervical spine fracture or abnormal prevertebral soft tissue swelling. Multilevel cervical spondylotic changes C3-4 through C6-7 have progressed slightly since prior exam. Bilateral pleural effusions layering posteriorly. Prominent carotid bifurcation calcifications. Electronically Signed   By: Genia Del M.D.   On: 04/03/2018 10:12   Ct Abdomen Pelvis W Contrast  Result Date: 04/02/2018 CLINICAL DATA:  Recent surgery to remove  infected wires from previous AICD. Also history of anal fistula repair. EXAM: CT CHEST, ABDOMEN, AND PELVIS WITH CONTRAST TECHNIQUE: Multidetector CT imaging of the chest, abdomen and pelvis was performed following the standard protocol during bolus administration of intravenous contrast. CONTRAST:  191m OMNIPAQUE IOHEXOL 300 MG/ML  SOLN COMPARISON:  CT abdomen 12/26/2017.  CT chest 12/02/2017. FINDINGS: CT CHEST FINDINGS Cardiovascular: Aortic atherosclerosis. Previous median sternotomy and CABG. Coronary artery calcification. Cardiomegaly. Pacemaker/AICD in place. Pericardial device on the left. Pericardial device anteriorly. Mediastinum/Nodes: Slight enlargement of AP window nodes compared to the previous exam, presumably reactive. Lungs/Pleura: Small effusions layering dependently. Dependent pulmonary atelectasis and or pneumonia, most pronounced in the left lower lobe. Non dependent lungs are clear. Musculoskeletal: Small fluid density area anterior to the xiphoid process measures about 2.5 cm in diameter and could possibly represent a superficial abscess. CT ABDOMEN PELVIS FINDINGS Hepatobiliary: Liver parenchyma appears normal. Calcified gallstones as seen previously. Pancreas: Atrophic change.  No focal finding. Spleen: Normal Adrenals/Urinary Tract: Adrenal glands are normal. Kidneys are normal except for a few benign cysts. Bladder is normal. Stomach/Bowel: No acute bowel finding. Vascular/Lymphatic: Aortic atherosclerosis. Branch vessel atherosclerosis. No aneurysm. No retroperitoneal adenopathy. Reproductive: Normal Other: No free fluid or air. Musculoskeletal: Soft tissue swelling of the left anterior abdominal wall region including the rectus muscle and overlying sheath, surrounding leads, some of which have been removed since the study of February. The area of concern measures up to 2.8 cm in thickness and 8.4 cm right to left and extends over a length of about 8.3 cm. This may simply be  postoperative sequela, but ongoing infection is not excluded. Ordinary degenerative changes affect the spine. IMPRESSION: Bilateral pleural effusions layering dependently with dependent atelectasis and or pneumonia, left lower lobe predominant. Enlargement of AP window lymph nodes, presumably reactive. Fluid collection anterior to the xiphoid process measuring up to 2.5 cm in diameter. This could be infected material. Fluid at the left anterior abdominal wall including around the previous anterior pericardial/epicardial leads, some of which have been removed. This area measures approximately 2.8 x 8.4 x 8.3 cm. This could simply be postoperative swelling and edema, but the possibility of ongoing or recurrent infection is not excluded. The appearance is better than on the study of 12/26/2017. Electronically Signed   By: MNelson ChimesM.D.   On: 04/02/2018 22:03   Dg Chest Port 1 View  Result Date: 04/12/2018 CLINICAL DATA:  PICC line placement. EXAM: PORTABLE CHEST 1 VIEW COMPARISON:  04/05/2018. FINDINGS: Unchanged cardiomegaly with single lead AICD. Prior CABG. Retrocardiac density representing subsegmental atelectasis, with or without superimposed consolidation. No pneumothorax. Thoracic aorta atherosclerosis. RIGHT PICC line lies with its tip at the cavoatrial junction. IMPRESSION: PICC line tip cavoatrial junction. Retrocardiac density, likely subsegmental atelectasis. Electronically Signed   By: JJenny Reichmann  Alfonse Flavors M.D.   On: 04/12/2018 10:54   Dg Chest Port 1 View  Result Date: 04/05/2018 CLINICAL DATA:  Status post sternal and abdominal wound debridement. Shortness of breath, cough EXAM: PORTABLE CHEST 1 VIEW COMPARISON:  04/04/2018 FINDINGS: Prior CABG. Left AICD remains in place, unchanged. Cardiomegaly. Low lung volumes without confluent opacity or effusion. No pneumothorax. IMPRESSION: Low lung volumes, cardiomegaly.  No active disease. Electronically Signed   By: Rolm Baptise M.D.   On: 04/05/2018 10:33    Dg Shoulder Left  Result Date: 04/03/2018 CLINICAL DATA:  Left shoulder pain after fall 2 weeks ago. EXAM: LEFT SHOULDER - 2+ VIEW COMPARISON:  Radiographs of February 17, 2006. FINDINGS: There is no evidence of fracture or dislocation. Mild degenerative changes seen involving the left acromioclavicular joint. Soft tissues are unremarkable. IMPRESSION: Mild degenerative joint disease of left acromioclavicular joint. No acute abnormality seen in the left shoulder. Electronically Signed   By: Marijo Conception, M.D.   On: 04/03/2018 09:27   [place] Ekg Site Rite  Result Date: 04/11/2018 If Site Rite image not attached, placement could not be confirmed due to current cardiac rhythm.     LAB RESULTS: Basic Metabolic Panel: Recent Labs  Lab 04/12/18 0246 04/07/2018 0500  NA 140 139  K 3.9 4.1  CL 100* 100*  CO2 33* 32  GLUCOSE 198* 176*  BUN 13 8  CREATININE 1.29* 1.07  CALCIUM 8.5* 8.5*  PHOS 2.8 3.2   Liver Function Tests: Recent Labs  Lab 04/07/18 0257  04/12/18 0246 04/17/2018 0500  AST 20  --   --   --   ALT <5*  --   --   --   ALKPHOS 141*  --   --   --   BILITOT 1.0  --   --   --   PROT 6.7  --   --   --   ALBUMIN 2.7*   < > 2.5* 2.4*   < > = values in this interval not displayed.   No results for input(s): LIPASE, AMYLASE in the last 168 hours. No results for input(s): AMMONIA in the last 168 hours. CBC: Recent Labs  Lab 04/07/18 0257 04/10/18 0235  WBC 10.6* 10.7*  HGB 9.1* 9.3*  HCT 29.5* 31.6*  MCV 81.0 84.9  PLT 270 267   Cardiac Enzymes: No results for input(s): CKTOTAL, CKMB, CKMBINDEX, TROPONINI in the last 168 hours. BNP: Invalid input(s): POCBNP CBG: Recent Labs  Lab 04/12/18 2116 04/16/2018 0735  GLUCAP 146* 165*      Disposition and Follow-up: Discharge Instructions    Diet Carb Modified   Complete by:  As directed    Home infusion instructions [facility] May follow [place] Dosing Protocol; May administer Cathflo as needed to  maintain patency of vascular access device.; Flushing of vascular access device: per [facility] Protocol: 0.9% NaCl pre/post medica...   Complete by:  As directed    Instructions:  May follow [place] Dosing Protocol   Instructions:  May administer Cathflo as needed to maintain patency of vascular access device.   Instructions:  Flushing of vascular access device: per [facility] Protocol: 0.9% NaCl pre/post medication administration and prn patency; Heparin 100 u/ml, 81m for implanted ports and Heparin 10u/ml, 566mfor all other central venous catheters.   Instructions:  May follow [facility] Anaphylaxis Protocol for First Dose Administration in the home: 0.9% NaCl at 25-50 ml/hr to maintain IV access for protocol meds. Epinephrine 0.3 ml IV/IM PRN  and Benadryl 25-50 IV/IM PRN s/s of anaphylaxis.   Instructions:  [facility] Coordinator (RN) to assist per patient IV care needs in the home PRN.   Home infusion instructions [facility] May follow [place] Dosing Protocol; May administer Cathflo as needed to maintain patency of vascular access device.; Flushing of vascular access device: per [facility] Protocol: 0.9% NaCl pre/post medica...   Complete by:  As directed    Instructions:  May follow [place] Dosing Protocol   Instructions:  May administer Cathflo as needed to maintain patency of vascular access device.   Instructions:  Flushing of vascular access device: per [facility] Protocol: 0.9% NaCl pre/post medication administration and prn patency; Heparin 100 u/ml, 46m for implanted ports and Heparin 10u/ml, 543mfor all other central venous catheters.   Instructions:  May follow [facility] Anaphylaxis Protocol for First Dose Administration in the home: 0.9% NaCl at 25-50 ml/hr to maintain IV access for protocol meds. Epinephrine 0.3 ml IV/IM PRN and Benadryl 25-50 IV/IM PRN s/s of anaphylaxis.   Instructions:  [facility] Coordinator (RN) to assist per patient IV care needs in the home PRN.    Increase activity slowly   Complete by:  As directed        DISPOSITION: [facility]  DISCHARGE FOLLOW-UP Follow-up Information    SnCarlyle BasquesMD Follow up on 05/30/2018.   Specialty:  Infectious Diseases Why:  10:30 am appointment  Contact information: [phone]        CoCyndi BenderPA-C. Schedule an appointment as soon as possible for a visit in 3 week(s).   Specialty:  Physician Assistant Contact information: [phone]        JoMartiniquePeter M, MD .   Specialty:  Cardiology Contact information: [id number] [phone]          Time coordinating discharge:  25 mins   Signed:   RiEstill Cotta.D. [facility] 04/06/2018, 11:18 AM Pager: [phone]

## 2018-04-14 ENCOUNTER — Other Ambulatory Visit (HOSPITAL_COMMUNITY): Payer: Self-pay

## 2018-04-14 DIAGNOSIS — J9690 Respiratory failure, unspecified, unspecified whether with hypoxia or hypercapnia: Secondary | ICD-10-CM | POA: Diagnosis not present

## 2018-04-14 LAB — COMPREHENSIVE METABOLIC PANEL
ALBUMIN: 2.5 g/dL — AB (ref 3.5–5.0)
ALT: <5 U/L — ABNORMAL LOW (ref 17–63)
AST: 23 U/L (ref 15–41)
Alkaline Phosphatase: 128 U/L — ABNORMAL HIGH (ref 38–126)
Anion gap: 6 (ref 5–15)
BUN: 8 mg/dL (ref 6–20)
CHLORIDE: 102 mmol/L (ref 101–111)
CO2: 30 mmol/L (ref 22–32)
Calcium: 8.4 mg/dL — ABNORMAL LOW (ref 8.9–10.3)
Creatinine, Ser: 1.12 mg/dL (ref 0.61–1.24)
GFR calc Af Amer: >60 mL/min (ref >60)
GFR calc non Af Amer: >60 mL/min (ref >60)
GLUCOSE: 75 mg/dL (ref 65–99)
POTASSIUM: 4.4 mmol/L (ref 3.5–5.1)
SODIUM: 138 mmol/L (ref 135–145)
Total Bilirubin: 0.5 mg/dL (ref 0.3–1.2)
Total Protein: 6.5 g/dL (ref 6.5–8.1)

## 2018-04-14 LAB — CBC WITH DIFFERENTIAL/PLATELET
ABS IMMATURE GRANULOCYTES: 0.1 10*3/uL (ref 0.0–0.1)
BASOS ABS: 0.1 10*3/uL (ref 0.0–0.1)
Basophils Relative: 1 %
Eosinophils Absolute: 0.3 10*3/uL (ref 0.0–0.7)
Eosinophils Relative: 4 %
HEMATOCRIT: 30 % — AB (ref 39.0–52.0)
HEMOGLOBIN: 8.9 g/dL — AB (ref 13.0–17.0)
Immature Granulocytes: 1 %
LYMPHS ABS: 1.5 10*3/uL (ref 0.7–4.0)
LYMPHS PCT: 20 %
MCH: 24.8 pg — AB (ref 26.0–34.0)
MCHC: 29.7 g/dL — ABNORMAL LOW (ref 30.0–36.0)
MCV: 83.6 fL (ref 78.0–100.0)
MONO ABS: 0.6 10*3/uL (ref 0.1–1.0)
MONOS PCT: 8 %
NEUTROS ABS: 4.8 10*3/uL (ref 1.7–7.7)
Neutrophils Relative %: 66 %
Platelets: 207 10*3/uL (ref 150–400)
RBC: 3.59 MIL/uL — ABNORMAL LOW (ref 4.22–5.81)
RDW: 16.1 % — ABNORMAL HIGH (ref 11.5–15.5)
WBC: 7.2 10*3/uL (ref 4.0–10.5)

## 2018-04-14 LAB — PROTIME-INR
INR: 1.4
PROTHROMBIN TIME: 17 s — AB (ref 11.4–15.2)

## 2018-04-17 ENCOUNTER — Other Ambulatory Visit (HOSPITAL_COMMUNITY): Payer: Self-pay

## 2018-04-17 DIAGNOSIS — K8020 Calculus of gallbladder without cholecystitis without obstruction: Secondary | ICD-10-CM | POA: Diagnosis not present

## 2018-04-17 LAB — RENAL FUNCTION PANEL
Albumin: 2.4 g/dL — ABNORMAL LOW (ref 3.5–5.0)
Anion gap: 9 (ref 5–15)
BUN: 14 mg/dL (ref 6–20)
CALCIUM: 8.5 mg/dL — AB (ref 8.9–10.3)
CO2: 31 mmol/L (ref 22–32)
CREATININE: 1.1 mg/dL (ref 0.61–1.24)
Chloride: 98 mmol/L — ABNORMAL LOW (ref 101–111)
GFR calc non Af Amer: >60 mL/min (ref >60)
Glucose, Bld: 114 mg/dL — ABNORMAL HIGH (ref 65–99)
Phosphorus: 3.5 mg/dL (ref 2.5–4.6)
Potassium: 4.4 mmol/L (ref 3.5–5.1)
SODIUM: 138 mmol/L (ref 135–145)

## 2018-04-17 LAB — CBC
HCT: 29 % — ABNORMAL LOW (ref 39.0–52.0)
HEMOGLOBIN: 8.4 g/dL — AB (ref 13.0–17.0)
MCH: 24.9 pg — ABNORMAL LOW (ref 26.0–34.0)
MCHC: 29 g/dL — AB (ref 30.0–36.0)
MCV: 85.8 fL (ref 78.0–100.0)
Platelets: 183 10*3/uL (ref 150–400)
RBC: 3.38 MIL/uL — ABNORMAL LOW (ref 4.22–5.81)
RDW: 16.3 % — ABNORMAL HIGH (ref 11.5–15.5)
WBC: 8.5 10*3/uL (ref 4.0–10.5)

## 2018-04-17 LAB — MAGNESIUM: MAGNESIUM: 1.7 mg/dL (ref 1.7–2.4)

## 2018-04-20 LAB — RENAL FUNCTION PANEL
Albumin: 2.3 g/dL — ABNORMAL LOW (ref 3.5–5.0)
Anion gap: 7 (ref 5–15)
BUN: 23 mg/dL — ABNORMAL HIGH (ref 6–20)
CHLORIDE: 102 mmol/L (ref 101–111)
CO2: 28 mmol/L (ref 22–32)
Calcium: 8.3 mg/dL — ABNORMAL LOW (ref 8.9–10.3)
Creatinine, Ser: 0.99 mg/dL (ref 0.61–1.24)
Glucose, Bld: 120 mg/dL — ABNORMAL HIGH (ref 65–99)
POTASSIUM: 4.7 mmol/L (ref 3.5–5.1)
Phosphorus: 3.6 mg/dL (ref 2.5–4.6)
Sodium: 137 mmol/L (ref 135–145)

## 2018-04-20 LAB — CBC
HCT: 29.1 % — ABNORMAL LOW (ref 39.0–52.0)
HEMOGLOBIN: 8.6 g/dL — AB (ref 13.0–17.0)
MCH: 25.4 pg — AB (ref 26.0–34.0)
MCHC: 29.6 g/dL — AB (ref 30.0–36.0)
MCV: 86.1 fL (ref 78.0–100.0)
Platelets: 186 10*3/uL (ref 150–400)
RBC: 3.38 MIL/uL — AB (ref 4.22–5.81)
RDW: 16.2 % — ABNORMAL HIGH (ref 11.5–15.5)
WBC: 6 10*3/uL (ref 4.0–10.5)

## 2018-04-20 LAB — MAGNESIUM: Magnesium: 1.8 mg/dL (ref 1.7–2.4)

## 2018-04-21 DIAGNOSIS — F331 Major depressive disorder, recurrent, moderate: Secondary | ICD-10-CM | POA: Diagnosis not present

## 2018-04-21 DEATH — deceased

## 2018-04-22 DIAGNOSIS — F4321 Adjustment disorder with depressed mood: Secondary | ICD-10-CM

## 2018-04-22 NOTE — Consult Note (Signed)
[place] Psychiatry Consult   Reason for Consult:  depression Referring Physician: Dr. Laren Everts Patient Identification: EINER MEALS MRN:  [id number] Principal Diagnosis: Adjustment disorder with depressed mood Diagnosis:   Patient Active Problem List   Diagnosis Date Noted  . Adjustment disorder with depressed mood [F43.21] 04/22/2018  . Nausea [R11.0]   . Physical deconditioning [R53.81]   . Goals of care, counseling/discussion [Z71.89]   . Palliative care by specialist [Z51.5]   . Protein-calorie malnutrition, severe [E43] 04/03/2018  . Abscess of abdominal wall [L02.211] 04/02/2018  . Abscess of chest wall [L02.213] 04/02/2018  . Sepsis ([place]) [A41.9] 04/02/2018  . Atrial fibrillation, chronic ([place]) [I48.2] 04/02/2018  . HLD (hyperlipidemia) [E78.5] 04/02/2018  . Stroke ([place]) [I63.9] 04/02/2018  . CAD (coronary artery disease) [I25.10] 04/02/2018  . Fall [W19.XXXA] 04/02/2018  . Abdominal wall abscess [L02.211]   . Normocytic anemia [D64.9] 01/01/2018  . Infected defibrillator ([place]) [T82.7XXA]   . MSSA (methicillin susceptible Staphylococcus aureus) infection [A49.01]   . Atrial fibrillation, persistent ([place]) [I48.1] 12/15/2017  . DNR (do not resuscitate) [Z66] 12/15/2017  . Debilitated [R53.81] 12/15/2017  . COPD (chronic obstructive pulmonary disease) ([place]) [J44.9] 12/15/2017  . Leukocytosis [D72.829] 12/06/2017  . Abdominal wall fluid collections [R18.8] 12/04/2017  . NSTEMI (non-ST elevated myocardial infarction) ([place]) [I21.4] 11/08/2017  . Elevated troponin [R74.8] 11/08/2017  . Demand ischemia ([place]) [I24.8]   . Acute on chronic combined systolic and diastolic CHF (congestive heart failure) ([place]) [I50.43]   . Chest pain [R07.9]   . AKI (acute kidney injury) ([place]) [N17.9] 07/16/2017  . Hyponatremia [E87.1] 07/16/2017  . Stage 3 chronic kidney disease ([place]) [N18.3] 10/27/2016  . Orthostatic hypotension [I95.1] 09/16/2016  . Emphysema of lung ([place]) [J43.9]  12/23/2015  . OSA (obstructive sleep apnea) [G47.33]   . Near syncope [R55] 06/28/2015  . GERD (gastroesophageal reflux disease) [K21.9]   . Hx of CABG 1990 [Z95.1]   . Essential hypertension [I10] 10/02/2014  . TIA (transient ischemic attack) [G45.9] 10/02/2014  . Microcytic anemia [D50.9] 04/25/2013  . Chronic systolic CHF (congestive heart failure) ([place]) [I50.22] 04/24/2013  . Type II diabetes mellitus with renal manifestations ([place]) [E11.29] 07/25/2009  . Cardiomyopathy, ischemic [I25.5] 07/25/2009  . VENTRICULAR TACHYCARDIA [I47.2] 07/25/2009  . Peripheral vascular disease ([place]) [I73.9] 07/25/2009  . Osteoarthritis [M19.90] 07/25/2009  . Arthropathy [M12.9] 07/25/2009  . BACK PAIN [M54.9] 07/25/2009  . Automatic implantable cardioverter-defibrillator in situ [Z95.810] 07/25/2009    Total Time spent with patient: 1 hour  Subjective:   Victor [name] is a 82 y.o. male patient admitted with abdominal wall abscess  HPI:   Patient is  82 year old male with medical history significant of anemia, Afib on eliquis, ischemic cardiomyopathy s/p AICD placement, CKD-3, COPD on 3L O2 via , CAD, s/p CABG, HTN, HLD, PVD, DM2, stroke, gerd,  CHF with EF 30%, OSA not on CPAP and Major Depression. He was admitted to the hospital due to pain, erythema in LUQ abdominal surgical site and sternal area. I was asked to evaluate patient for depression. Patient reports that he has become more depressed, tearful since his wife passed about 2 months ago. He states that: ''I miss my wife, we had a lot of fun together when she was alive.'' However, patient denies psychosis, mood swings, delusions, suicidal ideations, intent or plan. Today, he is more upbeat and happy. He was started on Duloxetine 30 mg few days ago.   Past Psychiatric History: depression  Risk to Self:  denies Risk  to Others:  denies Prior Inpatient Therapy:  unknown by patient  Prior Outpatient Therapy:  none  Past Medical History:  Past  Medical History:  Diagnosis Date  . Anemia   . Arthritis   . Atrial fibrillation, persistent ([place]) 12/15/2017  . Cardiomyopathy, ischemic    a. s/p ICD placement  . Chronic combined systolic and diastolic CHF (congestive heart failure) ([place])   . CKD (chronic kidney disease), stage III ([place])   . COPD (chronic obstructive pulmonary disease) ([place])   . Coronary artery disease    a. s/p CABG 1990. b. multiple PCIs since that time including CTO angioplasty (without stenting) PCI of RCA 09/2014, with last cath 02/2015 done for worsening CHF with unsuccessful attempt at PCI of the RCA due to inability to cross occlusion - medical therapy advised at that time.  . Debilitated 12/15/2017  . GERD (gastroesophageal reflux disease)   . HLD (hyperlipidemia)   . Hypertension   . Occlusion and stenosis of carotid artery without mention of cerebral infarction   . On home oxygen therapy    "2L; 24/7" (11/08/2017)  . Orthostatic hypotension   . Paroxysmal ventricular tachycardia ([place])   . Peripheral vascular disease ([place])   . Raynaud's syndrome   . Stroke [facility])    a. CT head 06/2015: Small old lacunar infarct LEFT basal ganglia.  . Thrombocytopenia ([place])   . TIA (transient ischemic attack)    a. s/p LHC on 10/01/14   . Type II diabetes mellitus ([place])     Past Surgical History:  Procedure Laterality Date  . ANAL FISTULECTOMY  2000's  . APPLICATION OF WOUND VAC Left 12/29/2017   Procedure: APPLICATION OF WOUND VAC;  Surgeon: Ivin Poot, MD;  Location: [place];  Service: Vascular;  Laterality: Left;  . APPLICATION OF WOUND VAC N/A 04/05/2018   Procedure: APPLICATION OF WOUND VAC;  Surgeon: Ivin Poot, MD;  Location: [place];  Service: Thoracic;  Laterality: N/A;  . APPLICATION OF WOUND VAC N/A 04/10/2018   Procedure: WOUND VAC CHANGE;  Surgeon: Ivin Poot, MD;  Location: [place];  Service: Thoracic;  Laterality: N/A;  . CARDIAC CATHETERIZATION  10/01/2014   PTVA to CTO with restoration of TIMI 3   . CATARACT EXTRACTION, BILATERAL Bilateral ~ 2009  . CHEST EXPLORATION N/A 12/11/2017   Procedure: IRRIGATION AND DEBRIDEMENT OF CHEST HEMATOMA;  Surgeon: Ivin Poot, MD;  Location: [place];  Service: Thoracic;  Laterality: N/A;  . CORONARY ARTERY BYPASS GRAFT  01/1989   "CABG X3"   . FINGER FRACTURE SURGERY Left ~ 2000   "pointer"  . I&D EXTREMITY Left 12/29/2017   Procedure: IRRIGATION AND DEBRIDEMENT ABDOMINAL WALL ABSCESS;  Surgeon: Ivin Poot, MD;  Location: [place];  Service: Vascular;  Laterality: Left;  . IMPLANTABLE CARDIOVERTER DEFIBRILLATOR (ICD) GENERATOR CHANGE Left 10/05/2011   Procedure: ICD GENERATOR CHANGE;  Surgeon: Evans Lance, MD;  Location[facility] CATH LAB;  Service: Cardiovascular;  Laterality: Left;  . LEFT AND RIGHT HEART CATHETERIZATION WITH CORONARY/GRAFT ANGIOGRAM N/A 04/26/2013   Procedure: LEFT AND RIGHT HEART CATHETERIZATION WITH Beatrix Fetters;  Surgeon: Sherren Mocha, MD;  Location[facility] C[facility] LAB;  Service: Cardiovascular;  Laterality: N/A;  . LEFT HEART CATHETERIZATION WITH CORONARY ANGIOGRAM N/A 08/01/2014   Procedure: LEFT HEART CATHETERIZATION WITH CORONARY ANGIOGRAM;  Surgeon: Sinclair Grooms, MD;  Location[facility] C[facility] LAB;  Service: Cardiovascular;  Laterality: N/A;  . LEFT HEART CATHETERIZATION WITH CORONARY ANGIOGRAM N/A 03/20/2015   Procedure:  LEFT HEART CATHETERIZATION WITH CORONARY ANGIOGRAM;  Surgeon: Peter M Martinique, MD;  Location: [facility] CATH LAB;  Service: Cardiovascular;  Laterality: N/A;  . PERCUTANEOUS STENT INTERVENTION  04/26/2013   Procedure: PERCUTANEOUS STENT INTERVENTION;  Surgeon: Sherren Mocha, MD;  Location: [facility] CATH LAB;  Service: Cardiovascular;;  . RIGHT HEART CATHETERIZATION  10/01/2014   Procedure: RIGHT HEART CATH;  Surgeon: Peter M Martinique, MD;  Location: [facility] CATH LAB;  Service: Cardiovascular;;  . TEE WITHOUT CARDIOVERSION N/A 01/03/2018   Procedure: TRANSESOPHAGEAL ECHOCARDIOGRAM (TEE);  Surgeon: Sueanne Margarita, MD;  Location:  [facility] ENDOSCOPY;  Service: Cardiovascular;  Laterality: N/A;  . WOUND DEBRIDEMENT N/A 04/05/2018   Procedure: SUPERFICIAL STERNAL AND ABDOMINAL WOUND DEBRIDEMENT;  Surgeon: Ivin Poot, MD;  Location: [facility] OR;  Service: Vascular;  Laterality: N/A;   Family History:  Family History  Problem Relation Age of Onset  . Heart failure Mother 63  . Heart attack Father   . Leukemia Sister   . Stomach cancer Maternal Grandfather    Family Psychiatric  History:  Social History:  Social History   Substance and Sexual Activity  Alcohol Use Yes   Comment: 11/08/2017 "wine w/dinner maybe once/yr; or less"     Social History   Substance and Sexual Activity  Drug Use No    Social History   Socioeconomic History  . Marital status: Widowed    Spouse name: Not on file  . Number of children: Not on file  . Years of education: Not on file  . Highest education level: Not on file  Occupational History  . Not on file  Social Needs  . Financial resource strain: Not on file  . Food insecurity:    Worry: Not on file    Inability: Not on file  . Transportation needs:    Medical: Not on file    Non-medical: Not on file  Tobacco Use  . Smoking status: Former Smoker    Years: 3.00    Types: Pipe  . Smokeless tobacco: Never Used  . Tobacco comment: "quit smoking pipe in early 1970's"  Substance and Sexual Activity  . Alcohol use: Yes    Comment: 11/08/2017 "wine w/dinner maybe once/yr; or less"  . Drug use: No  . Sexual activity: Never  Lifestyle  . Physical activity:    Days per week: Not on file    Minutes per session: Not on file  . Stress: Not on file  Relationships  . Social connections:    Talks on phone: Not on file    Gets together: Not on file    Attends religious service: Not on file    Active member of club or organization: Not on file    Attends meetings of clubs or organizations: Not on file    Relationship status: Not on file  Other Topics Concern  . 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    Additional Social History:    Allergies:   Allergies  Allergen Reactions  . Coreg [Carvedilol] Other (See Comments)    Shortness of breath ,fatigue ,dizzyness   . Lovastatin Other (See Comments)    CK elevation, Myalgias  . Ativan [Lorazepam] Other (See Comments)    Pt. had side effects    Labs: No results found for this or any previous visit (from the  past 48 hour(s)).  No current facility-administered medications for this encounter.     Musculoskeletal: Strength & Muscle Tone: within normal limits Gait & Station: lying in bed Patient leans: N/A  Psychiatric Specialty Exam: Physical Exam  Psychiatric: His speech is normal. Judgment and thought content normal. He is slowed and withdrawn. Cognition and memory are normal. He exhibits a depressed mood.    Review of Systems  Constitutional: Negative.   HENT: Negative.   Eyes: Negative.   Respiratory: Negative.   Cardiovascular: Negative.   Genitourinary: Negative.   Skin: Negative.   Endo/Heme/Allergies: Negative.   Psychiatric/Behavioral: Positive for depression.    There were no vitals taken for this visit.There is no height or weight on file to calculate BMI.  General Appearance: Casual  Eye Contact:  Good  Speech:  Clear and Coherent  Volume:  Normal  Mood:  Dysphoric  Affect:  Appropriate  Thought Process:  Coherent  Orientation:  Other:  only to place and person but not to time  Thought Content:  Logical  Suicidal Thoughts:  No  Homicidal Thoughts:  No  Memory:  Immediate;   Fair Recent;   Fair Remote;   Fair  Judgement:  Fair  Insight:  Fair  Psychomotor Activity:  Psychomotor Retardation  Concentration:  Concentration: Fair and Attention Span: Fair  Recall:  [employer] of Knowledge:  Fair  Language:  Good   Akathisia:  No  Handed:  Right  AIMS (if indicated):     Assets:  Communication Skills Desire for Improvement  ADL's:  marginal  Cognition:  Impaired,  Mild  Sleep:   fair     Treatment Plan Summary: Diagnosis: Adjustment disorder with depression  Medication management/Recommendations: -Continue Duloxetine 30 mg daily -Supportive therapy. -F/u in 1 week  Disposition: No evidence of imminent risk to self or others at present.   Supportive therapy provided about ongoing stressors.  Corena Pilgrim, MD 04/22/2018 3:00 PM

## 2018-04-23 LAB — RENAL FUNCTION PANEL
ANION GAP: 7 (ref 5–15)
Albumin: 2.3 g/dL — ABNORMAL LOW (ref 3.5–5.0)
BUN: 18 mg/dL (ref 6–20)
CHLORIDE: 103 mmol/L (ref 101–111)
CO2: 28 mmol/L (ref 22–32)
Calcium: 8.5 mg/dL — ABNORMAL LOW (ref 8.9–10.3)
Creatinine, Ser: 1.1 mg/dL (ref 0.61–1.24)
GFR calc Af Amer: >60 mL/min (ref >60)
GFR calc non Af Amer: >60 mL/min (ref >60)
GLUCOSE: 161 mg/dL — AB (ref 65–99)
PHOSPHORUS: 3.9 mg/dL (ref 2.5–4.6)
POTASSIUM: 4.9 mmol/L (ref 3.5–5.1)
Sodium: 138 mmol/L (ref 135–145)

## 2018-04-23 LAB — CBC
HEMATOCRIT: 31.9 % — AB (ref 39.0–52.0)
Hemoglobin: 9.2 g/dL — ABNORMAL LOW (ref 13.0–17.0)
MCH: 25 pg — AB (ref 26.0–34.0)
MCHC: 28.8 g/dL — AB (ref 30.0–36.0)
MCV: 86.7 fL (ref 78.0–100.0)
Platelets: 167 10*3/uL (ref 150–400)
RBC: 3.68 MIL/uL — ABNORMAL LOW (ref 4.22–5.81)
RDW: 16 % — AB (ref 11.5–15.5)
WBC: 5 10*3/uL (ref 4.0–10.5)

## 2018-04-23 LAB — MAGNESIUM: Magnesium: 1.9 mg/dL (ref 1.7–2.4)

## 2018-04-25 LAB — CBC
HCT: 28.5 % — ABNORMAL LOW (ref 39.0–52.0)
Hemoglobin: 8.1 g/dL — ABNORMAL LOW (ref 13.0–17.0)
MCH: 24.6 pg — ABNORMAL LOW (ref 26.0–34.0)
MCHC: 28.4 g/dL — ABNORMAL LOW (ref 30.0–36.0)
MCV: 86.6 fL (ref 78.0–100.0)
PLATELETS: 151 10*3/uL (ref 150–400)
RBC: 3.29 MIL/uL — AB (ref 4.22–5.81)
RDW: 15.9 % — ABNORMAL HIGH (ref 11.5–15.5)
WBC: 5.4 10*3/uL (ref 4.0–10.5)

## 2018-04-25 LAB — BASIC METABOLIC PANEL
ANION GAP: 4 — AB (ref 5–15)
BUN: 17 mg/dL (ref 6–20)
CALCIUM: 8.2 mg/dL — AB (ref 8.9–10.3)
CHLORIDE: 103 mmol/L (ref 101–111)
CO2: 34 mmol/L — ABNORMAL HIGH (ref 22–32)
CREATININE: 1.01 mg/dL (ref 0.61–1.24)
GFR calc non Af Amer: >60 mL/min (ref >60)
Glucose, Bld: 112 mg/dL — ABNORMAL HIGH (ref 65–99)
Potassium: 4.3 mmol/L (ref 3.5–5.1)
SODIUM: 141 mmol/L (ref 135–145)

## 2018-04-25 LAB — MAGNESIUM: MAGNESIUM: 1.8 mg/dL (ref 1.7–2.4)

## 2018-04-26 LAB — BASIC METABOLIC PANEL
Anion gap: 6 (ref 5–15)
BUN: 19 mg/dL (ref 6–20)
CALCIUM: 8.3 mg/dL — AB (ref 8.9–10.3)
CO2: 34 mmol/L — ABNORMAL HIGH (ref 22–32)
Chloride: 103 mmol/L (ref 101–111)
Creatinine, Ser: 0.97 mg/dL (ref 0.61–1.24)
GFR calc Af Amer: >60 mL/min (ref >60)
GLUCOSE: 23 mg/dL — AB (ref 65–99)
Potassium: 4.4 mmol/L (ref 3.5–5.1)
Sodium: 143 mmol/L (ref 135–145)

## 2018-04-26 LAB — CBC
HCT: 31.4 % — ABNORMAL LOW (ref 39.0–52.0)
Hemoglobin: 8.7 g/dL — ABNORMAL LOW (ref 13.0–17.0)
MCH: 24.3 pg — ABNORMAL LOW (ref 26.0–34.0)
MCHC: 27.7 g/dL — AB (ref 30.0–36.0)
MCV: 87.7 fL (ref 78.0–100.0)
Platelets: 148 10*3/uL — ABNORMAL LOW (ref 150–400)
RBC: 3.58 MIL/uL — ABNORMAL LOW (ref 4.22–5.81)
RDW: 16 % — AB (ref 11.5–15.5)
WBC: 7.8 10*3/uL (ref 4.0–10.5)

## 2018-04-27 LAB — URINALYSIS, ROUTINE W REFLEX MICROSCOPIC
Bilirubin Urine: NEGATIVE
GLUCOSE, UA: 50 mg/dL — AB
Ketones, ur: NEGATIVE mg/dL
NITRITE: NEGATIVE
PH: 5 (ref 5.0–8.0)
PROTEIN: 100 mg/dL — AB
RBC / HPF: >50 RBC/hpf — ABNORMAL HIGH (ref 0–5)
Specific Gravity, Urine: 1.019 (ref 1.005–1.030)

## 2018-04-28 ENCOUNTER — Other Ambulatory Visit (HOSPITAL_COMMUNITY): Payer: Self-pay

## 2018-04-28 DIAGNOSIS — R112 Nausea with vomiting, unspecified: Secondary | ICD-10-CM | POA: Diagnosis not present

## 2018-04-28 LAB — BASIC METABOLIC PANEL
Anion gap: 9 (ref 5–15)
BUN: 24 mg/dL — ABNORMAL HIGH (ref 6–20)
CALCIUM: 8.3 mg/dL — AB (ref 8.9–10.3)
CHLORIDE: 101 mmol/L (ref 101–111)
CO2: 29 mmol/L (ref 22–32)
Creatinine, Ser: 1.46 mg/dL — ABNORMAL HIGH (ref 0.61–1.24)
GFR calc non Af Amer: 44 mL/min — ABNORMAL LOW (ref >60)
GFR, EST AFRICAN AMERICAN: 51 mL/min — AB (ref >60)
GLUCOSE: 124 mg/dL — AB (ref 65–99)
Potassium: 5 mmol/L (ref 3.5–5.1)
Sodium: 139 mmol/L (ref 135–145)

## 2018-04-28 LAB — CBC
HEMATOCRIT: 32 % — AB (ref 39.0–52.0)
HEMOGLOBIN: 8.9 g/dL — AB (ref 13.0–17.0)
MCH: 24.5 pg — ABNORMAL LOW (ref 26.0–34.0)
MCHC: 27.8 g/dL — ABNORMAL LOW (ref 30.0–36.0)
MCV: 88.2 fL (ref 78.0–100.0)
Platelets: 158 10*3/uL (ref 150–400)
RBC: 3.63 MIL/uL — ABNORMAL LOW (ref 4.22–5.81)
RDW: 16.3 % — AB (ref 11.5–15.5)
WBC: 5.1 10*3/uL (ref 4.0–10.5)

## 2018-04-28 LAB — URINE CULTURE: Culture: NO GROWTH

## 2018-04-28 LAB — MAGNESIUM: Magnesium: 1.8 mg/dL (ref 1.7–2.4)

## 2018-04-29 ENCOUNTER — Other Ambulatory Visit (HOSPITAL_COMMUNITY): Payer: Self-pay

## 2018-04-29 DIAGNOSIS — R4020 Unspecified coma: Secondary | ICD-10-CM | POA: Diagnosis not present

## 2018-04-29 DIAGNOSIS — K56609 Unspecified intestinal obstruction, unspecified as to partial versus complete obstruction: Secondary | ICD-10-CM | POA: Diagnosis not present

## 2018-04-29 LAB — BASIC METABOLIC PANEL
ANION GAP: 7 (ref 5–15)
BUN: 27 mg/dL — ABNORMAL HIGH (ref 6–20)
CALCIUM: 8.4 mg/dL — AB (ref 8.9–10.3)
CO2: 29 mmol/L (ref 22–32)
Chloride: 103 mmol/L (ref 101–111)
Creatinine, Ser: 1.55 mg/dL — ABNORMAL HIGH (ref 0.61–1.24)
GFR calc Af Amer: 47 mL/min — ABNORMAL LOW (ref >60)
GFR calc non Af Amer: 41 mL/min — ABNORMAL LOW (ref >60)
Glucose, Bld: 132 mg/dL — ABNORMAL HIGH (ref 65–99)
Potassium: 5 mmol/L (ref 3.5–5.1)
Sodium: 139 mmol/L (ref 135–145)

## 2018-04-30 LAB — BASIC METABOLIC PANEL
Anion gap: 4 — ABNORMAL LOW (ref 5–15)
BUN: 25 mg/dL — AB (ref 6–20)
CHLORIDE: 104 mmol/L (ref 101–111)
CO2: 33 mmol/L — AB (ref 22–32)
Calcium: 8.3 mg/dL — ABNORMAL LOW (ref 8.9–10.3)
Creatinine, Ser: 1.48 mg/dL — ABNORMAL HIGH (ref 0.61–1.24)
GFR calc Af Amer: 50 mL/min — ABNORMAL LOW (ref >60)
GFR calc non Af Amer: 43 mL/min — ABNORMAL LOW (ref >60)
Glucose, Bld: 149 mg/dL — ABNORMAL HIGH (ref 65–99)
POTASSIUM: 4.3 mmol/L (ref 3.5–5.1)
SODIUM: 141 mmol/L (ref 135–145)

## 2018-04-30 LAB — CBC
HEMATOCRIT: 29.7 % — AB (ref 39.0–52.0)
HEMOGLOBIN: 8.2 g/dL — AB (ref 13.0–17.0)
MCH: 24.1 pg — AB (ref 26.0–34.0)
MCHC: 27.6 g/dL — ABNORMAL LOW (ref 30.0–36.0)
MCV: 87.4 fL (ref 78.0–100.0)
Platelets: 196 10*3/uL (ref 150–400)
RBC: 3.4 MIL/uL — AB (ref 4.22–5.81)
RDW: 16.5 % — ABNORMAL HIGH (ref 11.5–15.5)
WBC: 4.6 10*3/uL (ref 4.0–10.5)

## 2018-04-30 LAB — MAGNESIUM: Magnesium: 1.9 mg/dL (ref 1.7–2.4)

## 2018-05-01 LAB — RENAL FUNCTION PANEL
ALBUMIN: 2.3 g/dL — AB (ref 3.5–5.0)
ANION GAP: 4 — AB (ref 5–15)
BUN: 24 mg/dL — ABNORMAL HIGH (ref 6–20)
CALCIUM: 8.4 mg/dL — AB (ref 8.9–10.3)
CO2: 32 mmol/L (ref 22–32)
CREATININE: 1.49 mg/dL — AB (ref 0.61–1.24)
Chloride: 103 mmol/L (ref 101–111)
GFR calc Af Amer: 49 mL/min — ABNORMAL LOW (ref >60)
GFR calc non Af Amer: 43 mL/min — ABNORMAL LOW (ref >60)
Glucose, Bld: 149 mg/dL — ABNORMAL HIGH (ref 65–99)
PHOSPHORUS: 2.5 mg/dL (ref 2.5–4.6)
Potassium: 4.6 mmol/L (ref 3.5–5.1)
SODIUM: 139 mmol/L (ref 135–145)

## 2018-05-01 LAB — CBC
HCT: 29.1 % — ABNORMAL LOW (ref 39.0–52.0)
HEMOGLOBIN: 8.1 g/dL — AB (ref 13.0–17.0)
MCH: 24.3 pg — ABNORMAL LOW (ref 26.0–34.0)
MCHC: 27.8 g/dL — AB (ref 30.0–36.0)
MCV: 87.1 fL (ref 78.0–100.0)
Platelets: 174 10*3/uL (ref 150–400)
RBC: 3.34 MIL/uL — AB (ref 4.22–5.81)
RDW: 16.5 % — ABNORMAL HIGH (ref 11.5–15.5)
WBC: 4.6 10*3/uL (ref 4.0–10.5)

## 2018-05-01 LAB — MAGNESIUM: MAGNESIUM: 1.8 mg/dL (ref 1.7–2.4)

## 2018-05-02 ENCOUNTER — Other Ambulatory Visit (HOSPITAL_COMMUNITY): Payer: Self-pay

## 2018-05-03 ENCOUNTER — Other Ambulatory Visit (HOSPITAL_COMMUNITY): Payer: Self-pay

## 2018-05-03 DIAGNOSIS — Z4682 Encounter for fitting and adjustment of non-vascular catheter: Secondary | ICD-10-CM | POA: Diagnosis not present

## 2018-05-03 LAB — CBC
HCT: 31.4 % — ABNORMAL LOW (ref 39.0–52.0)
Hemoglobin: 8.7 g/dL — ABNORMAL LOW (ref 13.0–17.0)
MCH: 24.5 pg — ABNORMAL LOW (ref 26.0–34.0)
MCHC: 27.7 g/dL — ABNORMAL LOW (ref 30.0–36.0)
MCV: 88.5 fL (ref 78.0–100.0)
PLATELETS: 201 10*3/uL (ref 150–400)
RBC: 3.55 MIL/uL — AB (ref 4.22–5.81)
RDW: 17.4 % — AB (ref 11.5–15.5)
WBC: 6.4 10*3/uL (ref 4.0–10.5)

## 2018-05-03 LAB — BASIC METABOLIC PANEL
ANION GAP: 4 — AB (ref 5–15)
BUN: 19 mg/dL (ref 6–20)
CO2: 32 mmol/L (ref 22–32)
Calcium: 8.3 mg/dL — ABNORMAL LOW (ref 8.9–10.3)
Chloride: 104 mmol/L (ref 101–111)
Creatinine, Ser: 1.16 mg/dL (ref 0.61–1.24)
GFR calc Af Amer: >60 mL/min (ref >60)
GFR, EST NON AFRICAN AMERICAN: 58 mL/min — AB (ref >60)
GLUCOSE: 150 mg/dL — AB (ref 65–99)
POTASSIUM: 4.1 mmol/L (ref 3.5–5.1)
SODIUM: 140 mmol/L (ref 135–145)

## 2018-05-03 LAB — MAGNESIUM: Magnesium: 1.7 mg/dL (ref 1.7–2.4)

## 2018-05-03 LAB — PHOSPHORUS: Phosphorus: 2.6 mg/dL (ref 2.5–4.6)

## 2018-05-21 DEATH — deceased

## 2018-05-30 ENCOUNTER — Ambulatory Visit: Payer: Medicare Other | Admitting: Internal Medicine

## 2019-05-26 IMAGING — DX DG CHEST 1V PORT
1 series · 1 of 1 positions shown · non-contrast
Comparison: 12/06/2017

CLINICAL DATA: Shortness of breath

EXAM:
PORTABLE CHEST 1 VIEW

[chest]
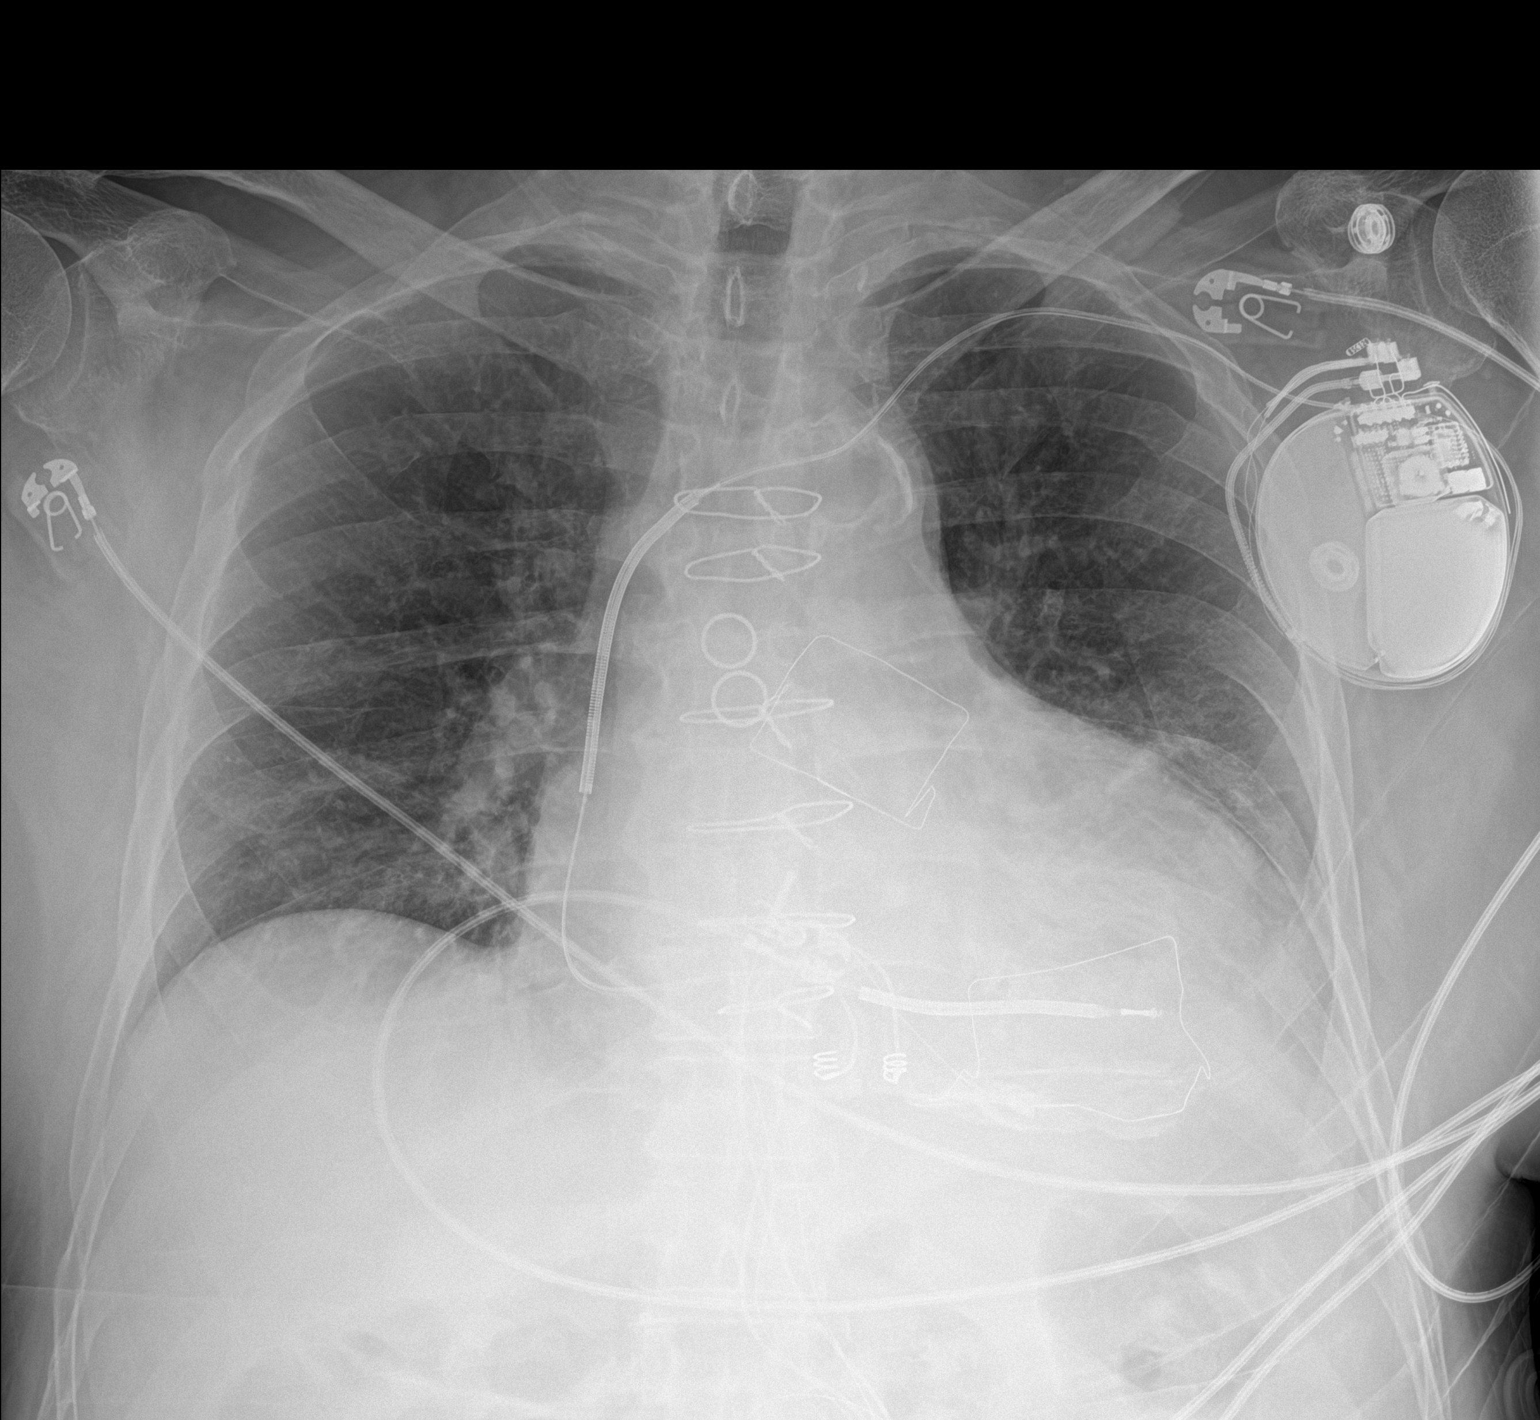

[1 of 1 positions shown; findings below may reference images not displayed]

FINDINGS: Chronic cardiomegaly. Status post CABG. There is trans venous and
direct epicardial pacer leads in stable position. Low volume chest
with interstitial crowding and asymmetric streaky retrocardiac
density. There is no edema, air bronchogram, effusion, or
pneumothorax.
IMPRESSION: Stable low lung volumes with probable retrocardiac atelectasis.

## 2019-06-01 IMAGING — DX DG CHEST 1V PORT
1 series · 1 of 1 positions shown · non-contrast
Comparison: 12/09/2017

CLINICAL DATA: Shortness of breath.  History of AFib.

EXAM:
PORTABLE CHEST 1 VIEW

[chest ap]
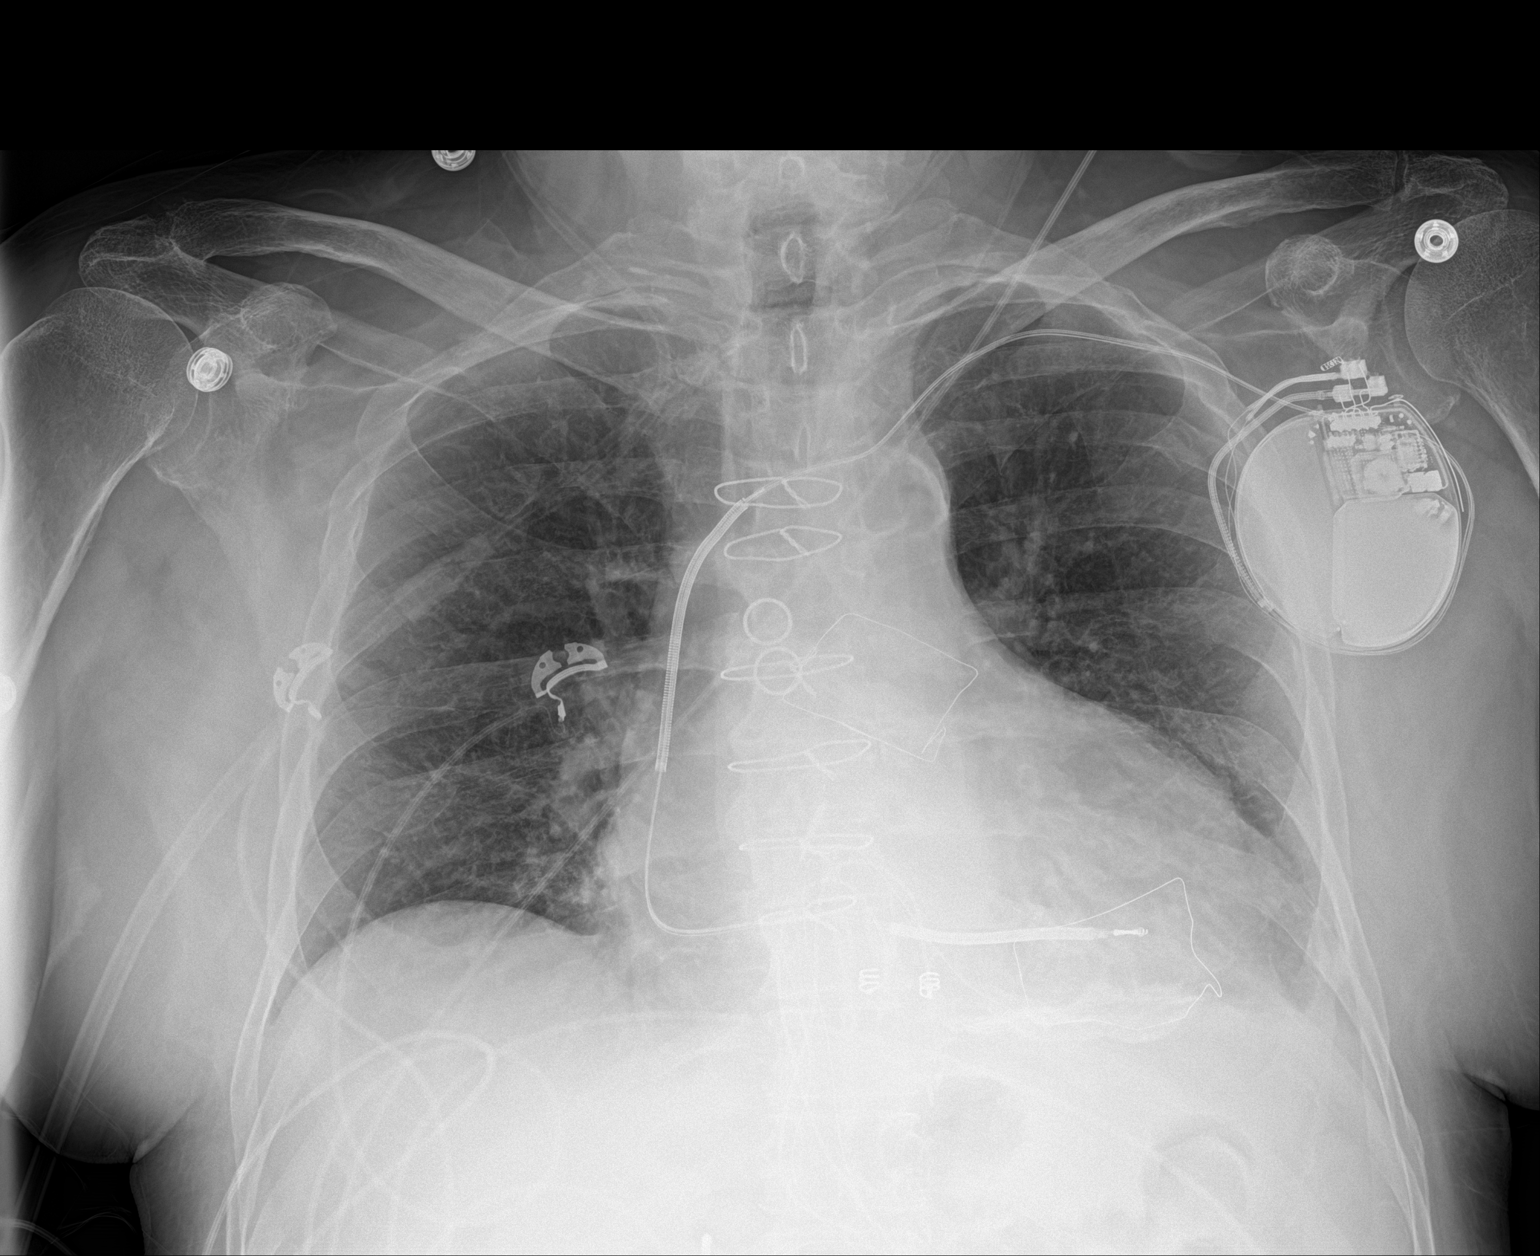

[1 of 1 positions shown; findings below may reference images not displayed]

FINDINGS: Stable postsurgical changes from CABG. Stable transvenous and direct
epicardial pacer leads.

Enlarged cardiac silhouette. Mediastinal contours appear intact.
Calcific atherosclerotic disease of the aorta.

There is no evidence of pneumothorax. No frank pulmonary edema. Low
lung volumes with mild prominence of the interstitium. Persistent
peribronchial airspace opacities in the left lower lobe. Possible
small left pleural effusion.

Osseous structures are without acute abnormality. Soft tissues are
grossly normal.
IMPRESSION: Low lung volumes with mild pulmonary vascular congestion.

Enlarged cardiac silhouette.

Persistent left lower lobe peribronchial airspace consolidation
versus atelectasis. Possible small left pleural effusion.
